# Patient Record
Sex: Female | Born: 1950
Health system: Southern US, Community
[De-identification: ages and names within clinical notes are randomized; demographics above are authoritative.]

## PROBLEM LIST (undated history)

## (undated) DIAGNOSIS — E039 Hypothyroidism, unspecified: Secondary | ICD-10-CM

## (undated) DIAGNOSIS — A6000 Herpesviral infection of urogenital system, unspecified: Secondary | ICD-10-CM

## (undated) DIAGNOSIS — I499 Cardiac arrhythmia, unspecified: Secondary | ICD-10-CM

## (undated) DIAGNOSIS — F419 Anxiety disorder, unspecified: Secondary | ICD-10-CM

## (undated) DIAGNOSIS — G629 Polyneuropathy, unspecified: Secondary | ICD-10-CM

## (undated) DIAGNOSIS — E7800 Pure hypercholesterolemia, unspecified: Secondary | ICD-10-CM

## (undated) DIAGNOSIS — K5900 Constipation, unspecified: Secondary | ICD-10-CM

## (undated) DIAGNOSIS — D649 Anemia, unspecified: Secondary | ICD-10-CM

## (undated) DIAGNOSIS — Z9884 Bariatric surgery status: Secondary | ICD-10-CM

## (undated) DIAGNOSIS — K649 Unspecified hemorrhoids: Secondary | ICD-10-CM

## (undated) DIAGNOSIS — E785 Hyperlipidemia, unspecified: Secondary | ICD-10-CM

## (undated) DIAGNOSIS — R748 Abnormal levels of other serum enzymes: Secondary | ICD-10-CM

## (undated) DIAGNOSIS — U071 COVID-19: Secondary | ICD-10-CM

## (undated) DIAGNOSIS — K219 Gastro-esophageal reflux disease without esophagitis: Secondary | ICD-10-CM

## (undated) DIAGNOSIS — T8859XA Other complications of anesthesia, initial encounter: Secondary | ICD-10-CM

## (undated) DIAGNOSIS — G4730 Sleep apnea, unspecified: Secondary | ICD-10-CM

## (undated) DIAGNOSIS — M1990 Unspecified osteoarthritis, unspecified site: Secondary | ICD-10-CM

## (undated) DIAGNOSIS — I10 Essential (primary) hypertension: Secondary | ICD-10-CM

## (undated) HISTORY — DX: Unspecified osteoarthritis, unspecified site: M19.90

## (undated) HISTORY — DX: Abnormal levels of other serum enzymes: R74.8

## (undated) HISTORY — DX: Anxiety disorder, unspecified: F41.9

## (undated) HISTORY — PX: ABDOMINAL HYSTERECTOMY: SHX81

## (undated) HISTORY — DX: COVID-19: U07.1

## (undated) HISTORY — DX: Constipation, unspecified: K59.00

## (undated) HISTORY — PX: LAPAROSCOPIC GASTRIC RESTRICTIVE DUODENAL PROCEDURE (DUODENAL SWITCH): SHX6667

## (undated) HISTORY — DX: Unspecified hemorrhoids: K64.9

## (undated) HISTORY — DX: Herpesviral infection of urogenital system, unspecified: A60.00

## (undated) HISTORY — PX: HEMORRHOID SURGERY: SHX153

## (undated) HISTORY — DX: Hyperlipidemia, unspecified: E78.5

## (undated) HISTORY — DX: Bariatric surgery status: Z98.84

## (undated) HISTORY — DX: Sleep apnea, unspecified: G47.30

---

## 1996-01-18 HISTORY — PX: BREAST EXCISIONAL BIOPSY: SUR124

## 2003-03-24 ENCOUNTER — Other Ambulatory Visit: Payer: Self-pay

## 2004-09-03 ENCOUNTER — Ambulatory Visit: Payer: Self-pay | Admitting: Obstetrics and Gynecology

## 2004-09-16 ENCOUNTER — Ambulatory Visit: Payer: Self-pay | Admitting: Obstetrics and Gynecology

## 2004-10-27 ENCOUNTER — Other Ambulatory Visit: Payer: Self-pay

## 2004-11-02 ENCOUNTER — Ambulatory Visit: Payer: Self-pay | Admitting: General Practice

## 2004-12-30 ENCOUNTER — Encounter: Payer: Self-pay | Admitting: General Practice

## 2005-01-17 ENCOUNTER — Encounter: Payer: Self-pay | Admitting: General Practice

## 2005-08-03 ENCOUNTER — Ambulatory Visit: Payer: Self-pay | Admitting: General Surgery

## 2005-09-28 ENCOUNTER — Ambulatory Visit: Payer: Self-pay | Admitting: Unknown Physician Specialty

## 2006-10-04 ENCOUNTER — Ambulatory Visit: Payer: Self-pay | Admitting: Unknown Physician Specialty

## 2007-08-06 ENCOUNTER — Ambulatory Visit: Payer: Self-pay | Admitting: General Practice

## 2007-08-07 ENCOUNTER — Ambulatory Visit: Payer: Self-pay | Admitting: General Practice

## 2007-10-09 ENCOUNTER — Ambulatory Visit: Payer: Self-pay | Admitting: Unknown Physician Specialty

## 2008-06-12 ENCOUNTER — Ambulatory Visit: Payer: Self-pay | Admitting: Internal Medicine

## 2008-12-10 ENCOUNTER — Ambulatory Visit: Payer: Self-pay | Admitting: Unknown Physician Specialty

## 2009-03-04 ENCOUNTER — Ambulatory Visit: Payer: Self-pay | Admitting: Unknown Physician Specialty

## 2009-07-22 ENCOUNTER — Ambulatory Visit: Payer: Self-pay | Admitting: General Practice

## 2009-10-27 ENCOUNTER — Encounter: Payer: Self-pay | Admitting: General Practice

## 2009-11-17 ENCOUNTER — Encounter: Payer: Self-pay | Admitting: General Practice

## 2009-12-16 ENCOUNTER — Ambulatory Visit: Payer: Self-pay | Admitting: Unknown Physician Specialty

## 2010-01-05 ENCOUNTER — Encounter: Payer: Self-pay | Admitting: General Practice

## 2010-01-17 ENCOUNTER — Encounter: Payer: Self-pay | Admitting: General Practice

## 2010-07-04 ENCOUNTER — Emergency Department: Payer: Self-pay | Admitting: Emergency Medicine

## 2011-01-06 ENCOUNTER — Ambulatory Visit: Payer: Self-pay | Admitting: Unknown Physician Specialty

## 2011-11-09 ENCOUNTER — Ambulatory Visit: Payer: Self-pay | Admitting: Internal Medicine

## 2011-11-10 ENCOUNTER — Ambulatory Visit: Payer: Self-pay | Admitting: Internal Medicine

## 2011-12-05 ENCOUNTER — Ambulatory Visit: Payer: Self-pay | Admitting: Internal Medicine

## 2011-12-30 ENCOUNTER — Ambulatory Visit: Payer: Self-pay | Admitting: Internal Medicine

## 2012-01-12 ENCOUNTER — Ambulatory Visit: Payer: Self-pay | Admitting: Internal Medicine

## 2012-01-16 DIAGNOSIS — J069 Acute upper respiratory infection, unspecified: Secondary | ICD-10-CM | POA: Insufficient documentation

## 2012-03-06 DIAGNOSIS — R9431 Abnormal electrocardiogram [ECG] [EKG]: Secondary | ICD-10-CM | POA: Insufficient documentation

## 2012-03-15 DIAGNOSIS — F4322 Adjustment disorder with anxiety: Secondary | ICD-10-CM | POA: Insufficient documentation

## 2012-03-15 DIAGNOSIS — F419 Anxiety disorder, unspecified: Secondary | ICD-10-CM | POA: Insufficient documentation

## 2012-03-15 DIAGNOSIS — F32A Depression, unspecified: Secondary | ICD-10-CM | POA: Insufficient documentation

## 2012-06-15 ENCOUNTER — Ambulatory Visit: Payer: Self-pay | Admitting: Unknown Physician Specialty

## 2012-07-17 ENCOUNTER — Encounter: Payer: Self-pay | Admitting: Family Medicine

## 2012-07-17 ENCOUNTER — Ambulatory Visit (INDEPENDENT_AMBULATORY_CARE_PROVIDER_SITE_OTHER): Payer: 59 | Admitting: Family Medicine

## 2012-07-17 VITALS — Ht 61.0 in | Wt 188.4 lb

## 2012-07-17 DIAGNOSIS — E669 Obesity, unspecified: Secondary | ICD-10-CM | POA: Insufficient documentation

## 2012-07-17 DIAGNOSIS — R7301 Impaired fasting glucose: Secondary | ICD-10-CM

## 2012-07-17 NOTE — Patient Instructions (Addendum)
-   Recommended Family Medicine practice:  [place] [facility].  - Diet Recommendations for Diabetes   Starchy (carb) foods include: Bread, rice, pasta, potatoes, corn, crackers, bagels, muffins, all baked goods.  The best carb foods are those with a lot of fiber, e.g., vegetables, fruit (especially those from which we eat skin and seeds)  Protein foods include: Meat, fish, poultry, eggs, dairy foods, and beans such as pinto and kidney beans (beans also provide carbohydrate).  When looking at labels, look for TOTAL CARBOHYDRATE, not just sugar.   1. Eat at least 3 meals and 1-2 snacks per day. Never go more than 4-5 hours while awake without eating.  2. Limit starchy foods to TWO per meal and ONE per snack. ONE portion of a starchy  food is equal to the following:   - ONE slice of bread (or its equivalent, such as half of a hamburger bun).   - 1/2 cup of a "scoopable" starchy food such as potatoes or rice.   - 1 OUNCE (28 grams) of starchy snack foods such as crackers or pretzels (look on label).   - 15 grams of carbohydrate as shown on food label.  3. Both lunch and dinner should include a protein food, a carb food, and vegetables.   - Obtain twice as many veg's as protein or carbohydrate foods for both lunch and dinner.   - Try to keep frozen veg's on hand for a quick vegetable serving.     - Fresh or frozen veg's are best.  4. Breakfast should always include protein.    - If you are using beans as a protein source, you need to have at least 1 full cup for a serving.  With tofu, at least 3 (4 is better) ounces for a full protein serving.   - Dr. Gerilyn Pilgrim:  [phone]; [email or web address].

## 2012-07-17 NOTE — Progress Notes (Signed)
Medical Nutrition Therapy:  Appt start time: 1400 end time:  1500.  Assessment:  Primary concerns today: Weight management and Blood sugar control.  Jennifer [name] was referred for MNT to prevent her pre-DM from progressing to DM.   Usual eating pattern includes 3 meals and 1-2 snacks per day. Usual physical activity includes 45 min TM walking and 15-20 min stationary bike at least 5 days a week; just started this routine this week.  She had an accident at work in January, and hurt her back.  She has been doing PT twice a week (as well as injection, which was not very helpful).   Jennifer [name]'s PCP will be moving to [facility], so she asked for a referral for a PCP in the [place] area.    A1C on 06-26-12 was 6.3; TG were 314; LDL 114.   Frequent foods include chicken, fish, low-sugar juices, water.  Avoided foods include none.    24-hr recall: (Up at 5:30 AM) B (6 AM)-   1/2 c dry oatmeal, 2 tbsp half & half, 4-6 pkts Splenda, 1/2 tsp cinnamon Snk ( AM)-   water L ( PM)-  none Snk (4 PM)-  2 glasses wine D (5 PM)-  5 oz tilapia w/ olive oil & butter, 1 1/2 c collards, sweet potato w/ 4 pkts Splenda & cinnamon Snk (7 PM)-  2 c cubed watermelon, 1 peach   Progress Towards Goal(s):  In progress.   Nutritional Diagnosis:  Jennifer [name]-3.3 Overweight/obesity As related to desirable body weight.  As evidenced by BMI >35.    Intervention:  Nutrition education.  Monitoring/Evaluation:  Dietary intake, exercise, and body weight in 6 week(s).

## 2012-08-28 ENCOUNTER — Ambulatory Visit: Payer: 59 | Admitting: Family Medicine

## 2012-09-18 ENCOUNTER — Ambulatory Visit: Payer: 59 | Admitting: Family Medicine

## 2012-10-10 ENCOUNTER — Ambulatory Visit: Payer: Self-pay | Admitting: Specialist

## 2012-11-13 ENCOUNTER — Ambulatory Visit: Payer: Self-pay | Admitting: Specialist

## 2012-11-17 ENCOUNTER — Ambulatory Visit: Payer: Self-pay | Admitting: Specialist

## 2012-12-17 ENCOUNTER — Ambulatory Visit: Payer: Self-pay | Admitting: Internal Medicine

## 2013-01-07 ENCOUNTER — Ambulatory Visit: Payer: Self-pay | Admitting: Unknown Physician Specialty

## 2013-01-09 LAB — PATHOLOGY REPORT

## 2013-01-17 HISTORY — PX: OTHER SURGICAL HISTORY: SHX169

## 2013-01-25 ENCOUNTER — Ambulatory Visit: Payer: Self-pay | Admitting: Specialist

## 2013-06-20 ENCOUNTER — Ambulatory Visit: Payer: Self-pay | Admitting: Specialist

## 2013-06-20 LAB — BASIC METABOLIC PANEL
Anion Gap: 5 — ABNORMAL LOW (ref 7–16)
BUN: 19 mg/dL — ABNORMAL HIGH (ref 7–18)
Calcium, Total: 10 mg/dL (ref 8.5–10.1)
Chloride: 99 mmol/L (ref 98–107)
Co2: 31 mmol/L (ref 21–32)
Creatinine: 0.67 mg/dL (ref 0.60–1.30)
EGFR (African American): >60
EGFR (Non-African Amer.): >60
Glucose: 100 mg/dL — ABNORMAL HIGH (ref 65–99)
Osmolality: 272 (ref 275–301)
Potassium: 3.6 mmol/L (ref 3.5–5.1)
Sodium: 135 mmol/L — ABNORMAL LOW (ref 136–145)

## 2013-06-25 ENCOUNTER — Inpatient Hospital Stay: Payer: Self-pay | Admitting: Specialist

## 2013-06-26 LAB — CBC WITH DIFFERENTIAL/PLATELET
Basophil #: 0 10*3/uL (ref 0.0–0.1)
Basophil %: 0.3 %
Eosinophil #: 0 10*3/uL (ref 0.0–0.7)
Eosinophil %: 0 %
HCT: 35.9 % (ref 35.0–47.0)
HGB: 11.2 g/dL — ABNORMAL LOW (ref 12.0–16.0)
Lymphocyte #: 1.5 10*3/uL (ref 1.0–3.6)
Lymphocyte %: 15 %
MCH: 26.3 pg (ref 26.0–34.0)
MCHC: 31.2 g/dL — ABNORMAL LOW (ref 32.0–36.0)
MCV: 84 fL (ref 80–100)
Monocyte #: 0.7 x10 3/mm (ref 0.2–0.9)
Monocyte %: 6.5 %
Neutrophil #: 8 10*3/uL — ABNORMAL HIGH (ref 1.4–6.5)
Neutrophil %: 78.2 %
Platelet: 184 10*3/uL (ref 150–440)
RBC: 4.27 10*6/uL (ref 3.80–5.20)
RDW: 14.5 % (ref 11.5–14.5)
WBC: 10.2 10*3/uL (ref 3.6–11.0)

## 2013-06-26 LAB — ALBUMIN: Albumin: 3.6 g/dL (ref 3.4–5.0)

## 2013-06-26 LAB — BASIC METABOLIC PANEL
Anion Gap: 5 — ABNORMAL LOW (ref 7–16)
BUN: 12 mg/dL (ref 7–18)
Calcium, Total: 8.5 mg/dL (ref 8.5–10.1)
Chloride: 106 mmol/L (ref 98–107)
Co2: 28 mmol/L (ref 21–32)
Creatinine: 0.72 mg/dL (ref 0.60–1.30)
EGFR (African American): >60
EGFR (Non-African Amer.): >60
Glucose: 108 mg/dL — ABNORMAL HIGH (ref 65–99)
Osmolality: 278 (ref 275–301)
Potassium: 4.3 mmol/L (ref 3.5–5.1)
Sodium: 139 mmol/L (ref 136–145)

## 2013-06-26 LAB — MAGNESIUM: Magnesium: 1.9 mg/dL

## 2013-06-26 LAB — PHOSPHORUS: Phosphorus: 2.8 mg/dL (ref 2.5–4.9)

## 2013-06-27 LAB — PATHOLOGY REPORT

## 2013-07-15 ENCOUNTER — Ambulatory Visit: Payer: Self-pay | Admitting: Specialist

## 2013-07-17 ENCOUNTER — Ambulatory Visit: Payer: Self-pay | Admitting: Specialist

## 2013-07-25 DIAGNOSIS — K2990 Gastroduodenitis, unspecified, without bleeding: Secondary | ICD-10-CM | POA: Insufficient documentation

## 2013-07-30 ENCOUNTER — Ambulatory Visit: Payer: Self-pay | Admitting: Unknown Physician Specialty

## 2013-08-20 DIAGNOSIS — B373 Candidiasis of vulva and vagina: Secondary | ICD-10-CM | POA: Insufficient documentation

## 2013-08-20 DIAGNOSIS — B3731 Acute candidiasis of vulva and vagina: Secondary | ICD-10-CM | POA: Insufficient documentation

## 2013-11-19 DIAGNOSIS — M5116 Intervertebral disc disorders with radiculopathy, lumbar region: Secondary | ICD-10-CM | POA: Insufficient documentation

## 2013-11-19 DIAGNOSIS — M5416 Radiculopathy, lumbar region: Secondary | ICD-10-CM | POA: Insufficient documentation

## 2013-11-21 ENCOUNTER — Ambulatory Visit: Payer: Self-pay | Admitting: Internal Medicine

## 2014-01-08 DIAGNOSIS — B009 Herpesviral infection, unspecified: Secondary | ICD-10-CM | POA: Insufficient documentation

## 2014-01-08 DIAGNOSIS — E079 Disorder of thyroid, unspecified: Secondary | ICD-10-CM | POA: Insufficient documentation

## 2014-05-10 NOTE — Op Note (Signed)
PATIENT NAME:  Jennifer, [name] MR#:  [id number] DATE OF BIRTH:  1951-01-11  DATE OF PROCEDURE:  06/25/2013  PREOPERATIVE DIAGNOSIS: Morbid obesity.   POSTOPERATIVE DIAGNOSES:  1.  Morbid obesity.  2.  Hiatal hernia.   PROCEDURE: Laparoscopic sleeve gastrectomy with hiatal hernia repair.   SURGEON: Kreg Shropshire, M.D.   ASSISTANT: Liliane Bade, PA [employer] .   ANESTHESIA: General endotracheal.   CLINICAL INDICATIONS: See H and P.    DETAILS OF PROCEDURE: The patient was taken to the operating room and placed on the operating room table in the supine, monitors and supplemental oxygen being delivered. Broad spectrum IV antibiotics were administered. The patient was placed under general anesthesia without incident. The abdomen was prepped and draped in the usual sterile fashion. Access was obtained using 5 mm Optical trocar in the left upper outer quadrant. Pneumoperitoneum was established. Multiple other ports were placed in preparation for sleeve gastrectomy and liver retractor placed. At that point, a moderate hiatal hernia was identified. The Harmonic scalpel was utilized to take down the pars flaccida of the right crura and the right crura was fully mobilized. The esophagus was retracted anteriorly showing the crural junction and the moderate hiatal hernia. The posterior vagus nerve was preserved throughout the entire dissection.  The posterior crural repair was performed using interrupted 0 Surgidac suture. At that point, attention was then placed to the stomach. The greater curvature vascular was mobilized from 5 to 7 cm from the pylorus all the way up to the left crura. The posterior attachments were taken down as well completely mobilizing the fundus and the greater curvature of the stomach so the crow's feet could be fully visualized all the way up. A 34 [place] Bougie was then inserted transorally down into the distal antrum and several sequential firings starting with an echelon green  load used to bisect the antrum between the lesser curvature and the greater curvature and then sequential firings of the blue load through the opposite trocar. Using the technique of Hargroder all the way up to just lateral to the GE junction. Staples were all formed very nicely. No evidence of leak was present. No evidence of bleeding was present. The specimen was retrieved and sent for pathologic evaluation. The entire operation blood loss was less than 250 mL. No complications occurred during the entire procedure. I performed upper endoscopy and a nice curvilinear tube was present without any impingement upon the linea angularis. I was quite pleased with the outcome. I used the suction and sucked out the extra gas. I then regowned and regloved and pulled out the delivery retractor as the specimen had already been retrieved. The wounds were closed with 4-0 Vicryl and Dermabond. The patient tolerated the procedure well, was taken to the recovery room in stable condition, and will be admitted for approximately two days for care.    ADDENDUM: The hiatal hernia was closed using interrupted Surgidac suture with excellent effect. ____________________________ Kreg Shropshire, MD jb:aw D: 06/25/2013 10:45:03 ET T: 06/25/2013 10:54:12 ET JOB#: [id number]  cc: Kreg Shropshire, MD, <Dictator> Bonner Puna MD ELECTRONICALLY SIGNED 06/26/2013 18:29

## 2014-07-23 DIAGNOSIS — E780 Pure hypercholesterolemia: Secondary | ICD-10-CM | POA: Diagnosis not present

## 2014-07-23 DIAGNOSIS — I10 Essential (primary) hypertension: Secondary | ICD-10-CM | POA: Diagnosis not present

## 2014-07-31 DIAGNOSIS — E038 Other specified hypothyroidism: Secondary | ICD-10-CM | POA: Diagnosis not present

## 2014-07-31 DIAGNOSIS — I10 Essential (primary) hypertension: Secondary | ICD-10-CM | POA: Diagnosis not present

## 2014-07-31 DIAGNOSIS — R739 Hyperglycemia, unspecified: Secondary | ICD-10-CM | POA: Diagnosis not present

## 2014-07-31 DIAGNOSIS — E780 Pure hypercholesterolemia: Secondary | ICD-10-CM | POA: Diagnosis not present

## 2014-08-25 DIAGNOSIS — J069 Acute upper respiratory infection, unspecified: Secondary | ICD-10-CM | POA: Diagnosis not present

## 2014-09-03 ENCOUNTER — Emergency Department
Admission: EM | Admit: 2014-09-03 | Discharge: 2014-09-03 | Disposition: A | Discharge status: Home / Self Care | Payer: Medicare Other | Attending: Emergency Medicine | Admitting: Emergency Medicine

## 2014-09-03 ENCOUNTER — Encounter: Payer: Self-pay | Admitting: Emergency Medicine

## 2014-09-03 ENCOUNTER — Other Ambulatory Visit: Payer: Self-pay

## 2014-09-03 DIAGNOSIS — Z7982 Long term (current) use of aspirin: Secondary | ICD-10-CM | POA: Insufficient documentation

## 2014-09-03 DIAGNOSIS — Z79899 Other long term (current) drug therapy: Secondary | ICD-10-CM | POA: Insufficient documentation

## 2014-09-03 DIAGNOSIS — Z87891 Personal history of nicotine dependence: Secondary | ICD-10-CM | POA: Diagnosis not present

## 2014-09-03 DIAGNOSIS — I10 Essential (primary) hypertension: Secondary | ICD-10-CM

## 2014-09-03 HISTORY — DX: Pure hypercholesterolemia, unspecified: E78.00

## 2014-09-03 HISTORY — DX: Hypothyroidism, unspecified: E03.9

## 2014-09-03 HISTORY — DX: Essential (primary) hypertension: I10

## 2014-09-03 LAB — BASIC METABOLIC PANEL
Anion gap: 10 (ref 5–15)
BUN: 15 mg/dL (ref 6–20)
CO2: 30 mmol/L (ref 22–32)
Calcium: 10 mg/dL (ref 8.9–10.3)
Chloride: 99 mmol/L — ABNORMAL LOW (ref 101–111)
Creatinine, Ser: 0.73 mg/dL (ref 0.44–1.00)
GFR calc Af Amer: >60 mL/min (ref >60)
GFR calc non Af Amer: >60 mL/min (ref >60)
Glucose, Bld: 115 mg/dL — ABNORMAL HIGH (ref 65–99)
Potassium: 3.6 mmol/L (ref 3.5–5.1)
Sodium: 139 mmol/L (ref 135–145)

## 2014-09-03 LAB — CBC
HCT: 41.1 % (ref 35.0–47.0)
Hemoglobin: 13.4 g/dL (ref 12.0–16.0)
MCH: 27.1 pg (ref 26.0–34.0)
MCHC: 32.5 g/dL (ref 32.0–36.0)
MCV: 83.4 fL (ref 80.0–100.0)
Platelets: 166 10*3/uL (ref 150–440)
RBC: 4.93 MIL/uL (ref 3.80–5.20)
RDW: 14.6 % — ABNORMAL HIGH (ref 11.5–14.5)
WBC: 7.4 10*3/uL (ref 3.6–11.0)

## 2014-09-03 LAB — TROPONIN I: Troponin I: <0.03 ng/mL (ref <0.031)

## 2014-09-03 NOTE — ED Provider Notes (Signed)
[facility] Emergency Department Provider Note   ____________________________________________  Time seen: 10:45 PM I have reviewed the triage vital signs and the triage nursing note.  HISTORY  Chief Complaint Hypertension   Historian Patient and husband  HPI Jennifer [name] is a 64 y.o. female who is coming in for evaluation of elevated blood pressures for at least one week. She's been taking her blood pressure and noticing that it's been elevated and creeping up over the past at least one week. She takes valsartan and hydrochlorothiazide. Her primary care physician is in [place]. She does report that she has a prescription for amlodipine, however she's not sure she's has been taking that daily and so she is not currently. No chest pain. This morning she did experience some right-sided neck fullness or discomfort up into her right temple, that is currently gone. No history of chronic headaches. There is no chest pain or trouble breathing. She has done a stress test in the past and this was about 6 years ago. No weakness or numbness. No speech problems.   Past Medical History  Diagnosis Date  . Hypertension   . High cholesterol   . Hypothyroidism     Patient Active Problem List   Diagnosis Date Noted  . Impaired fasting glucose 07/17/2012  . Obesity, unspecified 07/17/2012    Past Surgical History  Procedure Laterality Date  . Abdominal hysterectomy    . Carpal tunnel repair    . Bariatric sleeve    . Hemorrhoid surgery      Current Outpatient Rx  Name  Route  Sig  Dispense  Refill  . acyclovir (ZOVIRAX) 400 MG tablet   Oral   Take 400 mg by mouth every 4 (four) hours while awake.         [name] Kitchen amLODipine (NORVASC) 2.5 MG tablet   Oral   Take 2.5 mg by mouth daily.         [name] Kitchen aspirin 81 MG tablet   Oral   Take 81 mg by mouth daily.         . Biotin 5000 MCG CAPS   Oral   Take 5,000 mcg by mouth.         . cholecalciferol (VITAMIN  D) 1000 UNITS tablet   Oral   Take 1,000 Units by mouth daily.         . fluocinonide (LIDEX) 0.05 % external solution   Topical   Apply topically 2 (two) times daily.         [name] Kitchen HYDROCODONE BITARTRATE ER PO   Oral   Take by mouth as needed.         [name] Kitchen levothyroxine (SYNTHROID, LEVOTHROID) 25 MCG tablet   Oral   Take 25 mcg by mouth daily before breakfast.         . olmesartan-hydrochlorothiazide (BENICAR HCT) 40-25 MG per tablet   Oral   Take 1 tablet by mouth daily.         . Pediatric Multivit-Minerals-C (GUMMI BEAR MULTIVITAMIN/MIN PO)   Oral   Take by mouth.         . pravastatin (PRAVACHOL) 40 MG tablet   Oral   Take 40 mg by mouth daily.         [name] Kitchen zolpidem (AMBIEN) 10 MG tablet   Oral   Take 10 mg by mouth at bedtime as needed for sleep.           Allergies Vicodin  No family history on file.  Social History Social History  Substance Use Topics  . Smoking status: Former Research scientist (life sciences)  . Smokeless tobacco: Never Used  . Alcohol Use: 8.4 oz/week    14 Glasses of wine per week    Review of Systems  Constitutional: Negative for fever. Eyes: Negative for visual changes. ENT: Negative for sore throat. Cardiovascular: Negative for palpitations. Respiratory: Negative for shortness of breath. Gastrointestinal: Negative for abdominal pain, vomiting and diarrhea. Genitourinary: Negative for dysuria. Musculoskeletal: Negative for back pain. Skin: Negative for rash. Neurological: Negative for headaches, focal weakness or numbness. 10 point Review of Systems otherwise negative ____________________________________________   PHYSICAL EXAM:  VITAL SIGNS: ED Triage Vitals  Enc Vitals Group     BP 09/03/14 1911 153/78 mmHg     Pulse Rate 09/03/14 1911 62     Resp 09/03/14 2230 21     Temp 09/03/14 1911 97.6 F (36.4 C)     Temp Source 09/03/14 1911 Oral     SpO2 09/03/14 1911 99 %     Weight 09/03/14 1911 170 lb (77.111 kg)     Height 09/03/14  1911 5\' 2"  (1.575 m)     Head Cir --      Peak Flow --      Pain Score 09/03/14 1911 2     Pain Loc --      Pain Edu? --      Excl. in [place]? --      Constitutional: Alert and oriented. Well appearing and in no distress. Eyes: Conjunctivae are normal. PERRL. Normal extraocular movements. ENT   Head: Normocephalic and atraumatic.   Nose: No congestion/rhinnorhea.   Mouth/Throat: Mucous membranes are moist.   Neck: No stridor. Cardiovascular/Chest: Normal rate, regular rhythm.  No murmurs, rubs, or gallops. Respiratory: Normal respiratory effort without tachypnea nor retractions. Breath sounds are clear and equal bilaterally. No wheezes/rales/rhonchi. Gastrointestinal: Soft. No distention, no guarding, no rebound. Nontender   Genitourinary/rectal:Deferred Musculoskeletal: Nontender with normal range of motion in all extremities. No joint effusions.  No lower extremity tenderness nor edema. Neurologic:  Normal speech and language. No gross or focal neurologic deficits are appreciated. Skin:  Skin is warm, dry and intact. No rash noted. Psychiatric: Mood and affect are normal. Speech and behavior are normal. Patient exhibits appropriate insight and judgment.  ____________________________________________   EKG I, Lisa Roca, MD, the attending physician have personally viewed and interpreted all ECGs.  59 bpm. Sinus bradycardia. Narrow QRS. Normal axis. Nonspecific T wave. ____________________________________________  LABS (pertinent positives/negatives)  Basic metabolic panel without significant abnormality CBC within normal limits Troponin less than 0.03  ____________________________________________  RADIOLOGY All Xrays were viewed by me. Imaging interpreted by Radiologist.  None __________________________________________  PROCEDURES  Procedure(s) performed: None Critical Care performed: None  ____________________________________________   ED COURSE /  ASSESSMENT AND PLAN  CONSULTATIONS: None  Pertinent labs & imaging results that were available during my care of the patient were reviewed by me and considered in my medical decision making (see chart for details).   Patient's main complaint is hypertension, her blood pressure here is in the 160s over 80s. She did take a double dose of her blood pressure pill, abdomen ask her to do the same tomorrow if her blood pressure is elevated. She did have an episode of right-sided neck discomfort up into the right side of her temple, which is now gone, and was without any associated symptoms. I feel it is unlikely to have been an anginal equivalent, her EKG is reassuring, as  is her negative troponin. I would ask her to follow-up with a cardiologist, and referred her to Dr. Clayborn Bigness.  She is to call her private care physician's office in the morning to discuss any additional blood pressure medication management changes in medications.   Patient / Family / Caregiver informed of clinical course, medical decision-making process, and agree with plan.   I discussed return precautions, follow-up instructions, and discharged instructions with patient and/or family.  ___________________________________________   FINAL CLINICAL IMPRESSION(S) / ED DIAGNOSES   Final diagnoses:  Essential hypertension    FOLLOW UP  Referred to: Primary care physician, one week Cardiologist, Dr. Clayborn Bigness, 1-2 days.   Lisa Roca, MD 09/03/14 [phone]

## 2014-09-03 NOTE — Discharge Instructions (Signed)
You were evaluated for right sided neck discomfort, as well as elevated blood pressures, and your exam and evaluation are reassuring today.  Return to the emergency department for any new or worsening condition including chest pain, shortness of breath, nausea, vomiting, sweating, weakness, numbness, or any other symptoms concerning to you.  I suggested follow-up with a cardiologist, you're referred to call Dr. Etta Quill office in the morning for appointment within one to 2 days for further evaluation and possible stress test.  We discussed in the morning if your blood pressure is greater than 180/98 you may take a double dose of your blood pressure tablet valsartan/hydrochlorothiazide.  Hypertension Hypertension, commonly called high blood pressure, is when the force of blood pumping through your arteries is too strong. Your arteries are the blood vessels that carry blood from your heart throughout your body. A blood pressure reading consists of a higher number over a lower number, such as 110/72. The higher number (systolic) is the pressure inside your arteries when your heart pumps. The lower number (diastolic) is the pressure inside your arteries when your heart relaxes. Ideally you want your blood pressure below 120/80. Hypertension forces your heart to work harder to pump blood. Your arteries may become narrow or stiff. Having hypertension puts you at risk for heart disease, stroke, and other problems.  RISK FACTORS Some risk factors for high blood pressure are controllable. Others are not.  Risk factors you cannot control include:   Race. You may be at higher risk if you are African American.  Age. Risk increases with age.  Gender. Men are at higher risk than women before age 86 years. After age 20, women are at higher risk than men. Risk factors you can control include:  Not getting enough exercise or physical activity.  Being overweight.  Getting too much fat, sugar, calories, or  salt in your diet.  Drinking too much alcohol. SIGNS AND SYMPTOMS Hypertension does not usually cause signs or symptoms. Extremely high blood pressure (hypertensive crisis) may cause headache, anxiety, shortness of breath, and nosebleed. DIAGNOSIS  To check if you have hypertension, your health care provider will measure your blood pressure while you are seated, with your arm held at the level of your heart. It should be measured at least twice using the same arm. Certain conditions can cause a difference in blood pressure between your right and left arms. A blood pressure reading that is higher than normal on one occasion does not mean that you need treatment. If one blood pressure reading is high, ask your health care provider about having it checked again. TREATMENT  Treating high blood pressure includes making lifestyle changes and possibly taking medicine. Living a healthy lifestyle can help lower high blood pressure. You may need to change some of your habits. Lifestyle changes may include:  Following the DASH diet. This diet is high in fruits, vegetables, and whole grains. It is low in salt, red meat, and added sugars.  Getting at least 2 hours of brisk physical activity every week.  Losing weight if necessary.  Not smoking.  Limiting alcoholic beverages.  Learning ways to reduce stress. If lifestyle changes are not enough to get your blood pressure under control, your health care provider may prescribe medicine. You may need to take more than one. Work closely with your health care provider to understand the risks and benefits. HOME CARE INSTRUCTIONS  Have your blood pressure rechecked as directed by your health care provider.   Take medicines only  as directed by your health care provider. Follow the directions carefully. Blood pressure medicines must be taken as prescribed. The medicine does not work as well when you skip doses. Skipping doses also puts you at risk for  problems.   Do not smoke.   Monitor your blood pressure at home as directed by your health care provider. SEEK MEDICAL CARE IF:   You think you are having a reaction to medicines taken.  You have recurrent headaches or feel dizzy.  You have swelling in your ankles.  You have trouble with your vision. SEEK IMMEDIATE MEDICAL CARE IF:  You develop a severe headache or confusion.  You have unusual weakness, numbness, or feel faint.  You have severe chest or abdominal pain.  You vomit repeatedly.  You have trouble breathing. MAKE SURE YOU:   Understand these instructions.  Will watch your condition.  Will get help right away if you are not doing well or get worse. Document Released: 01/03/2005 Document Revised: 05/20/2013 Document Reviewed: 10/26/2012 [facility] Patient Information 2015 [place], [place]. This information is not intended to replace advice given to you by your health care provider. Make sure you discuss any questions you have with your health care provider.

## 2014-09-03 NOTE — ED Notes (Signed)
Pt to triage via w/c with no distress noted; pt reports elevated BP this evening, c/o pressure to right side neck/head/face; st hx of same

## 2014-09-05 DIAGNOSIS — R002 Palpitations: Secondary | ICD-10-CM | POA: Diagnosis not present

## 2014-09-05 DIAGNOSIS — R9431 Abnormal electrocardiogram [ECG] [EKG]: Secondary | ICD-10-CM | POA: Diagnosis not present

## 2014-09-05 DIAGNOSIS — I10 Essential (primary) hypertension: Secondary | ICD-10-CM | POA: Diagnosis not present

## 2014-09-05 DIAGNOSIS — R001 Bradycardia, unspecified: Secondary | ICD-10-CM | POA: Diagnosis not present

## 2014-09-05 DIAGNOSIS — E669 Obesity, unspecified: Secondary | ICD-10-CM | POA: Diagnosis not present

## 2014-09-10 DIAGNOSIS — R001 Bradycardia, unspecified: Secondary | ICD-10-CM | POA: Diagnosis not present

## 2014-09-11 DIAGNOSIS — I10 Essential (primary) hypertension: Secondary | ICD-10-CM | POA: Diagnosis not present

## 2014-09-11 DIAGNOSIS — L819 Disorder of pigmentation, unspecified: Secondary | ICD-10-CM | POA: Diagnosis not present

## 2014-09-11 DIAGNOSIS — E780 Pure hypercholesterolemia: Secondary | ICD-10-CM | POA: Diagnosis not present

## 2014-09-19 DIAGNOSIS — K219 Gastro-esophageal reflux disease without esophagitis: Secondary | ICD-10-CM | POA: Diagnosis not present

## 2014-09-19 DIAGNOSIS — Z7689 Persons encountering health services in other specified circumstances: Secondary | ICD-10-CM | POA: Diagnosis not present

## 2014-09-19 DIAGNOSIS — M1990 Unspecified osteoarthritis, unspecified site: Secondary | ICD-10-CM | POA: Diagnosis not present

## 2014-09-19 DIAGNOSIS — E784 Other hyperlipidemia: Secondary | ICD-10-CM | POA: Diagnosis not present

## 2014-10-20 DIAGNOSIS — R001 Bradycardia, unspecified: Secondary | ICD-10-CM | POA: Diagnosis not present

## 2014-10-21 DIAGNOSIS — E034 Atrophy of thyroid (acquired): Secondary | ICD-10-CM | POA: Diagnosis not present

## 2014-10-21 DIAGNOSIS — Z79899 Other long term (current) drug therapy: Secondary | ICD-10-CM | POA: Diagnosis not present

## 2014-10-21 DIAGNOSIS — I10 Essential (primary) hypertension: Secondary | ICD-10-CM | POA: Diagnosis not present

## 2014-10-21 DIAGNOSIS — R739 Hyperglycemia, unspecified: Secondary | ICD-10-CM | POA: Diagnosis not present

## 2014-10-21 DIAGNOSIS — E038 Other specified hypothyroidism: Secondary | ICD-10-CM | POA: Diagnosis not present

## 2014-10-21 DIAGNOSIS — E7800 Pure hypercholesterolemia, unspecified: Secondary | ICD-10-CM | POA: Diagnosis not present

## 2014-10-28 DIAGNOSIS — R739 Hyperglycemia, unspecified: Secondary | ICD-10-CM | POA: Diagnosis not present

## 2014-10-28 DIAGNOSIS — Z23 Encounter for immunization: Secondary | ICD-10-CM | POA: Diagnosis not present

## 2014-10-28 DIAGNOSIS — I10 Essential (primary) hypertension: Secondary | ICD-10-CM | POA: Diagnosis not present

## 2014-10-28 DIAGNOSIS — E7800 Pure hypercholesterolemia, unspecified: Secondary | ICD-10-CM | POA: Diagnosis not present

## 2014-10-28 DIAGNOSIS — E034 Atrophy of thyroid (acquired): Secondary | ICD-10-CM | POA: Diagnosis not present

## 2014-11-25 DIAGNOSIS — K912 Postsurgical malabsorption, not elsewhere classified: Secondary | ICD-10-CM | POA: Diagnosis not present

## 2014-11-25 DIAGNOSIS — Z9884 Bariatric surgery status: Secondary | ICD-10-CM | POA: Diagnosis not present

## 2014-12-02 DIAGNOSIS — H04123 Dry eye syndrome of bilateral lacrimal glands: Secondary | ICD-10-CM | POA: Diagnosis not present

## 2014-12-02 DIAGNOSIS — H2513 Age-related nuclear cataract, bilateral: Secondary | ICD-10-CM | POA: Diagnosis not present

## 2014-12-02 DIAGNOSIS — H524 Presbyopia: Secondary | ICD-10-CM | POA: Diagnosis not present

## 2014-12-05 DIAGNOSIS — Z23 Encounter for immunization: Secondary | ICD-10-CM | POA: Diagnosis not present

## 2015-01-22 DIAGNOSIS — E7800 Pure hypercholesterolemia, unspecified: Secondary | ICD-10-CM | POA: Diagnosis not present

## 2015-01-22 DIAGNOSIS — R739 Hyperglycemia, unspecified: Secondary | ICD-10-CM | POA: Diagnosis not present

## 2015-01-22 DIAGNOSIS — E034 Atrophy of thyroid (acquired): Secondary | ICD-10-CM | POA: Diagnosis not present

## 2015-01-28 DIAGNOSIS — E7800 Pure hypercholesterolemia, unspecified: Secondary | ICD-10-CM | POA: Diagnosis not present

## 2015-01-28 DIAGNOSIS — R7989 Other specified abnormal findings of blood chemistry: Secondary | ICD-10-CM | POA: Diagnosis not present

## 2015-01-28 DIAGNOSIS — I10 Essential (primary) hypertension: Secondary | ICD-10-CM | POA: Diagnosis not present

## 2015-01-28 DIAGNOSIS — E034 Atrophy of thyroid (acquired): Secondary | ICD-10-CM | POA: Diagnosis not present

## 2015-01-28 DIAGNOSIS — G4709 Other insomnia: Secondary | ICD-10-CM | POA: Diagnosis not present

## 2015-01-28 DIAGNOSIS — R739 Hyperglycemia, unspecified: Secondary | ICD-10-CM | POA: Diagnosis not present

## 2015-01-28 DIAGNOSIS — F419 Anxiety disorder, unspecified: Secondary | ICD-10-CM | POA: Diagnosis not present

## 2015-02-11 DIAGNOSIS — I10 Essential (primary) hypertension: Secondary | ICD-10-CM | POA: Diagnosis not present

## 2015-02-11 DIAGNOSIS — R7989 Other specified abnormal findings of blood chemistry: Secondary | ICD-10-CM | POA: Diagnosis not present

## 2015-02-11 DIAGNOSIS — E034 Atrophy of thyroid (acquired): Secondary | ICD-10-CM | POA: Diagnosis not present

## 2015-02-11 DIAGNOSIS — E7800 Pure hypercholesterolemia, unspecified: Secondary | ICD-10-CM | POA: Diagnosis not present

## 2015-02-11 DIAGNOSIS — F419 Anxiety disorder, unspecified: Secondary | ICD-10-CM | POA: Diagnosis not present

## 2015-02-12 ENCOUNTER — Other Ambulatory Visit: Payer: Self-pay | Admitting: Internal Medicine

## 2015-02-12 DIAGNOSIS — Z1231 Encounter for screening mammogram for malignant neoplasm of breast: Secondary | ICD-10-CM

## 2015-02-20 ENCOUNTER — Ambulatory Visit
Admission: RE | Admit: 2015-02-20 | Discharge: 2015-02-20 | Disposition: A | Discharge status: Home / Self Care | Payer: Medicare Other | Source: Ambulatory Visit | Attending: Internal Medicine | Admitting: Internal Medicine

## 2015-02-20 ENCOUNTER — Other Ambulatory Visit: Payer: Self-pay | Admitting: Internal Medicine

## 2015-02-20 DIAGNOSIS — Z1231 Encounter for screening mammogram for malignant neoplasm of breast: Secondary | ICD-10-CM | POA: Insufficient documentation

## 2015-03-23 DIAGNOSIS — E784 Other hyperlipidemia: Secondary | ICD-10-CM | POA: Diagnosis not present

## 2015-03-23 DIAGNOSIS — E079 Disorder of thyroid, unspecified: Secondary | ICD-10-CM | POA: Diagnosis not present

## 2015-03-23 DIAGNOSIS — M1990 Unspecified osteoarthritis, unspecified site: Secondary | ICD-10-CM | POA: Diagnosis not present

## 2015-03-23 DIAGNOSIS — E669 Obesity, unspecified: Secondary | ICD-10-CM | POA: Diagnosis not present

## 2015-03-23 DIAGNOSIS — R9431 Abnormal electrocardiogram [ECG] [EKG]: Secondary | ICD-10-CM | POA: Diagnosis not present

## 2015-03-23 DIAGNOSIS — R002 Palpitations: Secondary | ICD-10-CM | POA: Diagnosis not present

## 2015-03-23 DIAGNOSIS — R001 Bradycardia, unspecified: Secondary | ICD-10-CM | POA: Diagnosis not present

## 2015-03-23 DIAGNOSIS — I10 Essential (primary) hypertension: Secondary | ICD-10-CM | POA: Diagnosis not present

## 2015-04-15 DIAGNOSIS — E7800 Pure hypercholesterolemia, unspecified: Secondary | ICD-10-CM | POA: Diagnosis not present

## 2015-04-15 DIAGNOSIS — R7989 Other specified abnormal findings of blood chemistry: Secondary | ICD-10-CM | POA: Diagnosis not present

## 2015-04-15 DIAGNOSIS — E034 Atrophy of thyroid (acquired): Secondary | ICD-10-CM | POA: Diagnosis not present

## 2015-04-22 DIAGNOSIS — Z1159 Encounter for screening for other viral diseases: Secondary | ICD-10-CM | POA: Diagnosis not present

## 2015-04-22 DIAGNOSIS — Z79899 Other long term (current) drug therapy: Secondary | ICD-10-CM | POA: Diagnosis not present

## 2015-04-22 DIAGNOSIS — F419 Anxiety disorder, unspecified: Secondary | ICD-10-CM | POA: Diagnosis not present

## 2015-04-22 DIAGNOSIS — I10 Essential (primary) hypertension: Secondary | ICD-10-CM | POA: Diagnosis not present

## 2015-04-22 DIAGNOSIS — E7800 Pure hypercholesterolemia, unspecified: Secondary | ICD-10-CM | POA: Diagnosis not present

## 2015-04-22 DIAGNOSIS — R7989 Other specified abnormal findings of blood chemistry: Secondary | ICD-10-CM | POA: Diagnosis not present

## 2015-04-22 DIAGNOSIS — G4709 Other insomnia: Secondary | ICD-10-CM | POA: Diagnosis not present

## 2015-04-22 DIAGNOSIS — E034 Atrophy of thyroid (acquired): Secondary | ICD-10-CM | POA: Diagnosis not present

## 2015-04-22 DIAGNOSIS — R739 Hyperglycemia, unspecified: Secondary | ICD-10-CM | POA: Diagnosis not present

## 2015-07-07 DIAGNOSIS — Z9884 Bariatric surgery status: Secondary | ICD-10-CM | POA: Diagnosis not present

## 2015-07-07 DIAGNOSIS — K912 Postsurgical malabsorption, not elsewhere classified: Secondary | ICD-10-CM | POA: Diagnosis not present

## 2015-07-24 DIAGNOSIS — R7989 Other specified abnormal findings of blood chemistry: Secondary | ICD-10-CM | POA: Diagnosis not present

## 2015-07-24 DIAGNOSIS — Z1159 Encounter for screening for other viral diseases: Secondary | ICD-10-CM | POA: Diagnosis not present

## 2015-07-24 DIAGNOSIS — E7800 Pure hypercholesterolemia, unspecified: Secondary | ICD-10-CM | POA: Diagnosis not present

## 2015-07-24 DIAGNOSIS — Z79899 Other long term (current) drug therapy: Secondary | ICD-10-CM | POA: Diagnosis not present

## 2015-07-24 DIAGNOSIS — E034 Atrophy of thyroid (acquired): Secondary | ICD-10-CM | POA: Diagnosis not present

## 2015-07-24 DIAGNOSIS — R739 Hyperglycemia, unspecified: Secondary | ICD-10-CM | POA: Diagnosis not present

## 2015-08-26 DIAGNOSIS — R739 Hyperglycemia, unspecified: Secondary | ICD-10-CM | POA: Diagnosis not present

## 2015-08-26 DIAGNOSIS — I10 Essential (primary) hypertension: Secondary | ICD-10-CM | POA: Diagnosis not present

## 2015-08-26 DIAGNOSIS — E7800 Pure hypercholesterolemia, unspecified: Secondary | ICD-10-CM | POA: Diagnosis not present

## 2015-08-26 DIAGNOSIS — R0609 Other forms of dyspnea: Secondary | ICD-10-CM | POA: Diagnosis not present

## 2015-08-26 DIAGNOSIS — E034 Atrophy of thyroid (acquired): Secondary | ICD-10-CM | POA: Diagnosis not present

## 2015-10-01 DIAGNOSIS — E079 Disorder of thyroid, unspecified: Secondary | ICD-10-CM | POA: Diagnosis not present

## 2015-10-01 DIAGNOSIS — M1990 Unspecified osteoarthritis, unspecified site: Secondary | ICD-10-CM | POA: Diagnosis not present

## 2015-10-01 DIAGNOSIS — R002 Palpitations: Secondary | ICD-10-CM | POA: Diagnosis not present

## 2015-10-01 DIAGNOSIS — I10 Essential (primary) hypertension: Secondary | ICD-10-CM | POA: Diagnosis not present

## 2015-10-01 DIAGNOSIS — E669 Obesity, unspecified: Secondary | ICD-10-CM | POA: Diagnosis not present

## 2015-10-01 DIAGNOSIS — R9431 Abnormal electrocardiogram [ECG] [EKG]: Secondary | ICD-10-CM | POA: Diagnosis not present

## 2015-10-01 DIAGNOSIS — Z7689 Persons encountering health services in other specified circumstances: Secondary | ICD-10-CM | POA: Diagnosis not present

## 2015-10-01 DIAGNOSIS — E784 Other hyperlipidemia: Secondary | ICD-10-CM | POA: Diagnosis not present

## 2015-10-01 DIAGNOSIS — R001 Bradycardia, unspecified: Secondary | ICD-10-CM | POA: Diagnosis not present

## 2015-10-30 DIAGNOSIS — Z23 Encounter for immunization: Secondary | ICD-10-CM | POA: Diagnosis not present

## 2015-11-11 ENCOUNTER — Ambulatory Visit (INDEPENDENT_AMBULATORY_CARE_PROVIDER_SITE_OTHER): Payer: Medicare Other | Admitting: Family

## 2015-11-11 ENCOUNTER — Encounter: Payer: Self-pay | Admitting: Family

## 2015-11-11 VITALS — BP 140/75 | HR 77 | Temp 98.4°F | Ht 62.0 in | Wt 181.8 lb

## 2015-11-11 DIAGNOSIS — R7309 Other abnormal glucose: Secondary | ICD-10-CM | POA: Diagnosis not present

## 2015-11-11 DIAGNOSIS — Z7689 Persons encountering health services in other specified circumstances: Secondary | ICD-10-CM | POA: Diagnosis not present

## 2015-11-11 DIAGNOSIS — A6000 Herpesviral infection of urogenital system, unspecified: Secondary | ICD-10-CM | POA: Insufficient documentation

## 2015-11-11 DIAGNOSIS — I10 Essential (primary) hypertension: Secondary | ICD-10-CM

## 2015-11-11 DIAGNOSIS — J309 Allergic rhinitis, unspecified: Secondary | ICD-10-CM | POA: Insufficient documentation

## 2015-11-11 DIAGNOSIS — E785 Hyperlipidemia, unspecified: Secondary | ICD-10-CM | POA: Diagnosis not present

## 2015-11-11 DIAGNOSIS — Z0000 Encounter for general adult medical examination without abnormal findings: Secondary | ICD-10-CM | POA: Insufficient documentation

## 2015-11-11 DIAGNOSIS — J302 Other seasonal allergic rhinitis: Secondary | ICD-10-CM

## 2015-11-11 DIAGNOSIS — A6004 Herpesviral vulvovaginitis: Secondary | ICD-10-CM

## 2015-11-11 DIAGNOSIS — K219 Gastro-esophageal reflux disease without esophagitis: Secondary | ICD-10-CM

## 2015-11-11 LAB — COMPREHENSIVE METABOLIC PANEL
ALT: 99 U/L — ABNORMAL HIGH (ref 0–35)
AST: 152 U/L — ABNORMAL HIGH (ref 0–37)
Albumin: 4.6 g/dL (ref 3.5–5.2)
Alkaline Phosphatase: 70 U/L (ref 39–117)
BUN: 23 mg/dL (ref 6–23)
CO2: 30 mEq/L (ref 19–32)
Calcium: 10.1 mg/dL (ref 8.4–10.5)
Chloride: 102 mEq/L (ref 96–112)
Creatinine, Ser: 1.07 mg/dL (ref 0.40–1.20)
GFR: 65.93 mL/min (ref >60.00)
Glucose, Bld: 105 mg/dL — ABNORMAL HIGH (ref 70–99)
Potassium: 3.8 mEq/L (ref 3.5–5.1)
Sodium: 143 mEq/L (ref 135–145)
Total Bilirubin: 0.6 mg/dL (ref 0.2–1.2)
Total Protein: 7.8 g/dL (ref 6.0–8.3)

## 2015-11-11 LAB — LIPID PANEL
Cholesterol: 245 mg/dL — ABNORMAL HIGH (ref 0–200)
HDL: 54.6 mg/dL (ref >39.00)
Total CHOL/HDL Ratio: 4
Triglycerides: 666 mg/dL — ABNORMAL HIGH (ref 0.0–149.0)

## 2015-11-11 LAB — VITAMIN D 25 HYDROXY (VIT D DEFICIENCY, FRACTURES): VITD: 35.19 ng/mL (ref 30.00–100.00)

## 2015-11-11 LAB — TSH: TSH: 2.95 u[IU]/mL (ref 0.35–4.50)

## 2015-11-11 LAB — HEMOGLOBIN A1C: Hgb A1c MFr Bld: 6 % (ref 4.6–6.5)

## 2015-11-11 LAB — LDL CHOLESTEROL, DIRECT: Direct LDL: 83 mg/dL

## 2015-11-11 NOTE — Assessment & Plan Note (Signed)
Stable  Continue current regimen  

## 2015-11-11 NOTE — Assessment & Plan Note (Signed)
No fever. Duration 4 days. Patient and I agreed on conservative management. Advised Mucinex. Return precautions given.

## 2015-11-11 NOTE — Patient Instructions (Signed)
Alternate being using saline nasal spray and nasocort.   Continue to treat conservative treatment. Mucinex OTC.   Let me know if not better.  BP log.   F/u CPE.

## 2015-11-11 NOTE — Assessment & Plan Note (Signed)
Reviewed past medical, social, and family history with patient. Ordered fasting screening labs. Patient will return in 1 month for CPE. Requesting records.

## 2015-11-11 NOTE — Assessment & Plan Note (Signed)
At goal. Continue current regimen. She also follows with cardiology, Dr. Clayborn Bigness.

## 2015-11-11 NOTE — Assessment & Plan Note (Signed)
Stable on maintenance dose. We'll continue current regimen.

## 2015-11-11 NOTE — Progress Notes (Signed)
Subjective:    Patient ID: Jennifer [name], female    DOB: 12/31/1950, 65 y.o.   MRN: [id number]  CC: Jennifer [name] is a 65 y.o. female who presents today to establish care.  HPI: Patient presents to establish care as a new patient to our practice. Prior care had been with Dr.Teresa Nydia Bouton . We are requesting those records  HTN- Averages 127/70s at home. established with Dr. Carlyle Basques. Per patient had stress test 2016, normal. Echo 2016 which was normal per patient. No h/o HF. Told she had slow heart rate.   Genital herpes- Stable. On maintenance/suppression dose.   GERD- Controlled. No unintentional weightloss, difficulty swallowing.   Prediabetes- last a1c 5.8 07/2015. Managing with diet and has seen nutritionist few months ago.   Insomnia- on trazodone. Has tinnitus in right ear since she was 19; hard to fall asleep. No depression, anxiety.   Complains of runny nose and sinus congestion for past 4 days, improving. Endorses itchy eyes, sore throat.[name] KitchenHas seasonal allergies. Taking OTC zyrtec with some relief. No fever, cough, wheezing.   Follows with [facility]. Per patient, had Pap 2017, normal. No h/o GYN cancer.     HISTORY:  Past Medical History:  Diagnosis Date  . High cholesterol   . Hypertension   . Hypothyroidism    Past Surgical History:  Procedure Laterality Date  . ABDOMINAL HYSTERECTOMY    . bariatric sleeve  2015  . BREAST EXCISIONAL BIOPSY Left 1998  . carpal tunnel repair    . HEMORRHOID SURGERY     Family History  Problem Relation Age of Onset  . Breast cancer Sister 59    materal 1/2 sister    Allergies: Vicodin [hydrocodone-acetaminophen] Current Outpatient Prescriptions on File Prior to Visit  Medication Sig Dispense Refill  . acyclovir (ZOVIRAX) 400 MG tablet Take 400 mg by mouth every 4 (four) hours while awake.    [name] Kitchen amLODipine (NORVASC) 2.5 MG tablet Take 2.5 mg by mouth daily.    [name] Kitchen aspirin 81 MG tablet Take 81 mg by mouth  daily.    . Biotin 5000 MCG CAPS Take 5,000 mcg by mouth.    . cholecalciferol (VITAMIN D) 1000 UNITS tablet Take 1,000 Units by mouth daily.    [name] Kitchen levothyroxine (SYNTHROID, LEVOTHROID) 25 MCG tablet Take 25 mcg by mouth daily before breakfast.    . Pediatric Multivit-Minerals-C (GUMMI BEAR MULTIVITAMIN/MIN PO) Take by mouth.    . pravastatin (PRAVACHOL) 40 MG tablet Take 40 mg by mouth daily.     No current facility-administered medications on file prior to visit.     Social History  Substance Use Topics  . Smoking status: Former Research scientist (life sciences)  . Smokeless tobacco: Never Used  . Alcohol use 8.4 oz/week    14 Glasses of wine per week    Review of Systems  Constitutional: Negative for chills and fever.  Respiratory: Negative for cough.   Cardiovascular: Negative for chest pain and palpitations.  Gastrointestinal: Negative for nausea and vomiting.      Objective:    BP 140/75   Pulse 77   Temp 98.4 F (36.9 C) (Oral)   Ht 5\' 2"  (1.575 m)   Wt 181 lb 12.8 oz (82.5 kg)   SpO2 97%   BMI 33.25 kg/m  BP Readings from Last 3 Encounters:  11/11/15 140/75  09/03/14 (!) 153/89   Wt Readings from Last 3 Encounters:  11/11/15 181 lb 12.8 oz (82.5 kg)  09/03/14 170 lb (77.1 kg)  07/17/12 188  lb 6.4 oz (85.5 kg)    Physical Exam  Constitutional: She appears well-developed and well-nourished.  HENT:  Head: Normocephalic and atraumatic.  Right Ear: Hearing, tympanic membrane, external ear and ear canal normal. No drainage, swelling or tenderness. No foreign bodies. Tympanic membrane is not erythematous and not bulging. No middle ear effusion. No decreased hearing is noted.  Left Ear: Hearing, tympanic membrane, external ear and ear canal normal. No drainage, swelling or tenderness. No foreign bodies. Tympanic membrane is not erythematous and not bulging.  No middle ear effusion. No decreased hearing is noted.  Nose: Nose normal. No rhinorrhea. Right sinus exhibits no maxillary sinus  tenderness and no frontal sinus tenderness. Left sinus exhibits no maxillary sinus tenderness and no frontal sinus tenderness.  Mouth/Throat: Uvula is midline, oropharynx is clear and moist and mucous membranes are normal. No oropharyngeal exudate, posterior oropharyngeal edema, posterior oropharyngeal erythema or tonsillar abscesses.  Eyes: Conjunctivae are normal.  Cardiovascular: Normal rate, regular rhythm, normal heart sounds and normal pulses.   Pulmonary/Chest: Effort normal and breath sounds normal. She has no wheezes. She has no rhonchi. She has no rales.  Lymphadenopathy:       Head (right side): No submental, no submandibular, no tonsillar, no preauricular, no posterior auricular and no occipital adenopathy present.       Head (left side): No submental, no submandibular, no tonsillar, no preauricular, no posterior auricular and no occipital adenopathy present.    She has no cervical adenopathy.  Neurological: She is alert.  Skin: Skin is warm and dry.  Psychiatric: She has a normal mood and affect. Her speech is normal and behavior is normal. Thought content normal.  Vitals reviewed.      Assessment & Plan:   Problem List Items Addressed This Visit      Cardiovascular and Mediastinum   Essential hypertension    At goal. Continue current regimen. She also follows with cardiology, Dr. Clayborn Bigness.      Relevant Medications   valsartan-hydrochlorothiazide (DIOVAN-HCT) 160-12.5 MG tablet     Respiratory   Allergic rhinitis    No fever. Duration 4 days. Patient and I agreed on conservative management. Advised Mucinex. Return precautions given.        Digestive   GERD (gastroesophageal reflux disease)    Stable. Continue current regimen.      Relevant Medications   omeprazole (PRILOSEC) 20 MG capsule     Genitourinary   Genital herpes    Stable on maintenance dose. We'll continue current regimen.        Other   Hyperlipidemia   Relevant Medications    valsartan-hydrochlorothiazide (DIOVAN-HCT) 160-12.5 MG tablet   Encounter to establish care - Primary    Reviewed past medical, social, and family history with patient. Ordered fasting screening labs. Patient will return in 1 month for CPE. Requesting records.      Relevant Orders   Comprehensive metabolic panel   Hemoglobin A1c   Lipid panel   TSH   VITAMIN D 25 Hydroxy (Vit-D Deficiency, Fractures)    Other Visit Diagnoses   None.      I have discontinued Ms. Mccree's zolpidem, olmesartan-hydrochlorothiazide, fluocinonide, HYDROCODONE BITARTRATE ER PO, and FLUZONE HIGH-DOSE. I am also having her maintain her amLODipine, pravastatin, levothyroxine, acyclovir, Biotin, aspirin, cholecalciferol, Pediatric Multivit-Minerals-C (GUMMI BEAR MULTIVITAMIN/MIN PO), valsartan-hydrochlorothiazide, omeprazole, traZODone, triamcinolone, and Lifitegrast.   Meds ordered this encounter  Medications  . valsartan-hydrochlorothiazide (DIOVAN-HCT) 160-12.5 MG tablet    Sig: TAKE 1 TABLET DAILY  .  DISCONTD: FLUZONE HIGH-DOSE 0.5 ML SUSY    Sig: ADM 0.5ML IM UTD    Refill:  0  . omeprazole (PRILOSEC) 20 MG capsule    Sig: Take 20 mg by mouth daily.  . traZODone (DESYREL) 50 MG tablet    Sig: Take 50 mg by mouth at bedtime.  . triamcinolone (NASACORT AQ) 55 MCG/ACT AERO nasal inhaler    Sig: Place 2 sprays into the nose daily.  [name] Kitchen Lifitegrast (XIIDRA) 5 % SOLN    Sig: Apply to eye.    Return precautions given.   Risks, benefits, and alternatives of the medications and treatment plan prescribed today were discussed, and patient expressed understanding.   Education regarding symptom management and diagnosis given to patient on AVS.  Continue to follow with [name], FNP for routine health maintenance.   Jennifer [name] and I agreed with plan.   [name], FNP

## 2015-11-13 ENCOUNTER — Telehealth: Payer: Self-pay | Admitting: Family

## 2015-11-13 MED ORDER — VALSARTAN-HYDROCHLOROTHIAZIDE 160-12.5 MG PO TABS: 1.0000 | ORAL_TABLET | Freq: Every day | ORAL | 1 refills | Status: DC

## 2015-11-13 NOTE — Telephone Encounter (Signed)
Pt called requesting a medication refill on valsartan-hydrochlorothiazide (DIOVAN-HCT) 160-12.5 MG tablet. Pt normally use [facility] but would like a 30 days supply [facility] to [facility].  Pharmacy - [facility] [id number] [place], [place] [address]  Call pt @ [phone]

## 2015-11-13 NOTE — Telephone Encounter (Signed)
Medication has been refilled.

## 2015-11-25 ENCOUNTER — Ambulatory Visit (INDEPENDENT_AMBULATORY_CARE_PROVIDER_SITE_OTHER): Payer: Medicare Other | Admitting: Family

## 2015-11-25 ENCOUNTER — Encounter: Payer: Self-pay | Admitting: Family

## 2015-11-25 ENCOUNTER — Telehealth: Payer: Self-pay | Admitting: Family

## 2015-11-25 VITALS — BP 128/78 | HR 59 | Temp 98.5°F | Wt 184.1 lb

## 2015-11-25 DIAGNOSIS — J0120 Acute ethmoidal sinusitis, unspecified: Secondary | ICD-10-CM

## 2015-11-25 DIAGNOSIS — M791 Myalgia, unspecified site: Secondary | ICD-10-CM

## 2015-11-25 LAB — POCT INFLUENZA A/B
Influenza A, POC: NEGATIVE
Influenza B, POC: NEGATIVE

## 2015-11-25 MED ORDER — AMOXICILLIN-POT CLAVULANATE ER 1000-62.5 MG PO TB12: 2.0000 | ORAL_TABLET | Freq: Two times a day (BID) | ORAL | 0 refills | Status: DC

## 2015-11-25 NOTE — Telephone Encounter (Signed)
Pt called and stated that she is still having symptoms from when she saw M. Arnett on 10/25. She is having head congestion, sore throat, fatigue, and swollen glands under ear. Pt has been taking Mucinex and has been taking a generic allergy medication, she has had little relied. Please advise, thank you!  Call pt @ [phone]

## 2015-11-25 NOTE — Telephone Encounter (Signed)
Will come in at 230 today to be seen, thanks

## 2015-11-25 NOTE — Telephone Encounter (Signed)
Patient was seen on 10/25 to establish care, she was advised to use conservative measures, of saline and OTC mucinex.  She is still not feeling well, please advise? Need an appt or meds? Thanks she is due to return this month fora physical.

## 2015-11-25 NOTE — Patient Instructions (Signed)
Antibiotics.  Probiotics  Increase intake of clear fluids. Congestion is best treated by hydration, when mucus is wetter, it is thinner, less sticky, and easier to expel from the body, either through coughing up drainage, or by blowing your nose.   Get plenty of rest.   Use saline nasal drops and blow your nose frequently. Run a humidifier at night and elevate the head of the bed. Vicks Vapor rub will help with congestion and cough. Steam showers and sinus massage for congestion.   Use Acetaminophen or Ibuprofen as needed for fever or pain. Avoid second hand smoke. Even the smallest exposure will worsen symptoms.   Over the counter medications you can try include Delsym for cough, a decongestant for congestion, and Mucinex or Robitussin as an expectorant. Be sure to just get the plain Mucinex or Robitussin that just has one medication (Guaifenesen). We don't recommend the combination products. Note, be sure to drink two glasses of water with each dose of Mucinex as the medication will not work well without adequate hydration.   You can also try a teaspoon of honey to see if this will help reduce cough. Throat lozenges can sometimes be beneficial as well.    This illness will typically last 7 - 10 days.   Please follow up with our clinic if you develop a fever greater than 101 F, symptoms worsen, or do not resolve in the next week.

## 2015-11-25 NOTE — Telephone Encounter (Signed)
Pt requested a update, she stated that she feels as if she has the flu Pt contact [phone]

## 2015-11-25 NOTE — Progress Notes (Signed)
Subjective:    Patient ID: Jennifer [name], female    DOB: 06/14/1950, 65 y.o.   MRN: [id number]  CC: Jennifer [name] is a 65 y.o. female who presents today for an acute visit.    HPI: Patient here for acute visit with CC cough and sinus congestion and pressure, worsening over past 4-5 days. Seen over a week ago for congestion and advised conservative therapy at that which is not working. No wheezing, SOB, fever. . Endorses body aches, chills, ears and eyes itching. Tried tylenol, ASA, zrytec without resolve.    No h/o lung disease.    HISTORY:  Past Medical History:  Diagnosis Date  . High cholesterol   . Hypertension   . Hypothyroidism    Past Surgical History:  Procedure Laterality Date  . ABDOMINAL HYSTERECTOMY    . bariatric sleeve  2015  . BREAST EXCISIONAL BIOPSY Left 1998  . carpal tunnel repair    . HEMORRHOID SURGERY     Family History  Problem Relation Age of Onset  . Breast cancer Sister 22    materal 1/2 sister    Allergies: Vicodin [hydrocodone-acetaminophen] Current Outpatient Prescriptions on File Prior to Visit  Medication Sig Dispense Refill  . acyclovir (ZOVIRAX) 400 MG tablet Take 400 mg by mouth every 4 (four) hours while awake.    [name] Kitchen amLODipine (NORVASC) 2.5 MG tablet Take 2.5 mg by mouth daily.    [name] Kitchen aspirin 81 MG tablet Take 81 mg by mouth daily.    . Biotin 5000 MCG CAPS Take 5,000 mcg by mouth.    . cholecalciferol (VITAMIN D) 1000 UNITS tablet Take 1,000 Units by mouth daily.    [name] Kitchen levothyroxine (SYNTHROID, LEVOTHROID) 25 MCG tablet Take 25 mcg by mouth daily before breakfast.    . Lifitegrast (XIIDRA) 5 % SOLN Apply to eye.    [name] Kitchen omeprazole (PRILOSEC) 20 MG capsule Take 20 mg by mouth daily.    . pravastatin (PRAVACHOL) 40 MG tablet Take 40 mg by mouth daily.    . traZODone (DESYREL) 50 MG tablet Take 50 mg by mouth at bedtime.    . triamcinolone (NASACORT AQ) 55 MCG/ACT AERO nasal inhaler Place 2 sprays into the nose daily.    .  valsartan-hydrochlorothiazide (DIOVAN-HCT) 160-12.5 MG tablet Take 1 tablet by mouth daily. 90 tablet 1  . Pediatric Multivit-Minerals-C (GUMMI BEAR MULTIVITAMIN/MIN PO) Take by mouth.     No current facility-administered medications on file prior to visit.     Social History  Substance Use Topics  . Smoking status: Former Research scientist (life sciences)  . Smokeless tobacco: Never Used  . Alcohol use 8.4 oz/week    14 Glasses of wine per week    Review of Systems  Constitutional: Negative for chills and fever.  HENT: Positive for congestion, sinus pressure and sore throat. Negative for ear pain.   Eyes: Positive for itching.  Respiratory: Positive for cough. Negative for shortness of breath and wheezing.   Cardiovascular: Negative for chest pain and palpitations.  Gastrointestinal: Negative for nausea and vomiting.      Objective:    BP 128/78 (BP Location: Left Arm, Patient Position: Sitting, Cuff Size: Normal)   Pulse (!) 59   Temp 98.5 F (36.9 C) (Oral)   Wt 184 lb 2 oz (83.5 kg)   SpO2 98%   BMI 33.68 kg/m    Physical Exam  Constitutional: She appears well-developed and well-nourished.  HENT:  Head: Normocephalic and atraumatic.  Right Ear: Hearing, tympanic membrane, external ear and  ear canal normal. No drainage, swelling or tenderness. No foreign bodies. Tympanic membrane is not erythematous and not bulging. No middle ear effusion. No decreased hearing is noted.  Left Ear: Hearing, tympanic membrane, external ear and ear canal normal. No drainage, swelling or tenderness. No foreign bodies. Tympanic membrane is not erythematous and not bulging.  No middle ear effusion. No decreased hearing is noted.  Nose: Nose normal. No rhinorrhea. Right sinus exhibits no maxillary sinus tenderness and no frontal sinus tenderness. Left sinus exhibits no maxillary sinus tenderness and no frontal sinus tenderness.  Mouth/Throat: Uvula is midline, oropharynx is clear and moist and mucous membranes are normal.  No trismus in the jaw. No uvula swelling. No oropharyngeal exudate, posterior oropharyngeal edema, posterior oropharyngeal erythema or tonsillar abscesses.  Bilateral tenderness noted over parotid glands. Slightly enlarged. No pain or swelling over mandible. No increased warmth, erythema.   Eyes: Conjunctivae are normal.  Cardiovascular: Regular rhythm, normal heart sounds and normal pulses.   Pulmonary/Chest: Effort normal and breath sounds normal. She has no wheezes. She has no rhonchi. She has no rales.  Lymphadenopathy:       Head (right side): No submental, no submandibular, no tonsillar, no preauricular, no posterior auricular and no occipital adenopathy present.       Head (left side): No submental, no submandibular, no tonsillar, no preauricular, no posterior auricular and no occipital adenopathy present.    She has no cervical adenopathy.  Neurological: She is alert.  Skin: Skin is warm and dry.  Psychiatric: She has a normal mood and affect. Her speech is normal and behavior is normal. Thought content normal.  Vitals reviewed.      Assessment & Plan:   1. Myalgia Afebrile. Neg flu.  - POCT Influenza A/B  2. Acute non-recurrent ethmoidal sinusitis Based on duration of symptoms, patient and I jointly agreed to start antibiotics. Noted slight enlargement bilaterally of parotids. No trismus, fever. Patient does not appear septic and was born in [name] with childhood vaccine, making mumps very unlikely. Atypical to present bilaterally for suppurative parotitis. Dicussed this with patient and should will remain very vigilant and let me know if not better. Return precautions given.   - amoxicillin-clavulanate (AUGMENTIN XR) 1000-62.5 MG 12 hr tablet; Take 2 tablets by mouth 2 (two) times daily.  Dispense: 14 tablet; Refill: 0    I am having Jennifer [name] maintain her amLODipine, pravastatin, levothyroxine, acyclovir, Biotin, aspirin, cholecalciferol, Pediatric Multivit-Minerals-C (GUMMI  BEAR MULTIVITAMIN/MIN PO), omeprazole, traZODone, triamcinolone, Lifitegrast, valsartan-hydrochlorothiazide, acetaminophen, and multivitamin with minerals.   Meds ordered this encounter  Medications  . acetaminophen (TYLENOL) 500 MG tablet    Sig: Take 500 mg by mouth.  . Multiple Vitamins-Minerals (MULTIVITAMIN WITH MINERALS) tablet    Sig: Take 1 tablet by mouth daily.    Return precautions given.   Risks, benefits, and alternatives of the medications and treatment plan prescribed today were discussed, and patient expressed understanding.   Education regarding symptom management and diagnosis given to patient on AVS.  Continue to follow with [name], FNP for routine health maintenance.   Jennifer [name] and I agreed with plan.   [name], FNP

## 2015-11-26 ENCOUNTER — Telehealth: Payer: Self-pay | Admitting: Family

## 2015-11-26 NOTE — Telephone Encounter (Signed)
Pt called and was calling with an update. She said that she is getting along slowly, she started medication last night and took 2 yesterday, and 2 today. Thank you!  Call pt @ [phone]

## 2015-11-27 NOTE — Progress Notes (Signed)
Spoke with patient this am states glands still swollen left more than right , still congested , still has a lot of sinus congestion , jaws still swollen, denies fever.    Patient states she feels antibiotic maybe helping some.   She has not started taking the Mucinex .

## 2015-11-29 NOTE — Telephone Encounter (Signed)
Please call and see how patient is doing with sinus infection.   thanks

## 2015-11-30 NOTE — Telephone Encounter (Signed)
Patient stated that she is feeling better but it is still lingering. Also patient is wondering if she can do anything for her watery eyes? Please advise.

## 2015-11-30 NOTE — Telephone Encounter (Signed)
If itchy and watery, may try OTC antihistamine drops.   Otherwise, likely viral and will run their course.

## 2015-12-01 NOTE — Telephone Encounter (Signed)
Pt called back returning your call. Thank you!  Call pt @ [phone]

## 2015-12-01 NOTE — Telephone Encounter (Signed)
Left message for patient to return call back.  

## 2015-12-02 NOTE — Telephone Encounter (Signed)
Patient has been informed.

## 2015-12-07 ENCOUNTER — Encounter: Payer: Self-pay | Admitting: Family

## 2015-12-07 ENCOUNTER — Ambulatory Visit: Payer: Medicare Other | Admitting: Pharmacist

## 2015-12-07 ENCOUNTER — Other Ambulatory Visit: Payer: Self-pay | Admitting: Family

## 2015-12-07 ENCOUNTER — Encounter: Payer: Self-pay | Admitting: Pharmacist

## 2015-12-07 VITALS — BP 156/82 | HR 63 | Wt 183.0 lb

## 2015-12-07 DIAGNOSIS — Z9884 Bariatric surgery status: Secondary | ICD-10-CM | POA: Insufficient documentation

## 2015-12-07 DIAGNOSIS — R7401 Elevation of levels of liver transaminase levels: Secondary | ICD-10-CM

## 2015-12-07 DIAGNOSIS — R740 Nonspecific elevation of levels of transaminase and lactic acid dehydrogenase [LDH]: Principal | ICD-10-CM

## 2015-12-07 DIAGNOSIS — E785 Hyperlipidemia, unspecified: Secondary | ICD-10-CM

## 2015-12-07 NOTE — Patient Instructions (Addendum)

## 2015-12-07 NOTE — Progress Notes (Addendum)
Patient ID: Jennifer [name]                 DOB: 10-02-1950                    MRN: [id number]   HPI: Jennifer [name] is a 65 y.o. female patient referred to pharmacist clinic by Bernette Redbird, FNP due to elevated triglycerides at her last visit on 11/11/2015.  Today patient states that prior to that visit she had yogurt with splenda and a premier protein drink at 230 am prior to her labs at 830 am on 11/11/15.  She also had just returned from a 5 day vacation.  She also reports she was previously referred for an ultrasound due to elevated liver enzymes and was told this may be due to fatty liver.   Current Medications: Pravastatin 40 mg daily  Intolerances: None LDL goal: <100 mg/dL  Reports she did take her medications this AM.  Reports previous MD instructed her to take an extra BP pill.  She has taken an extra tablet 2x over the past 3 weeks.  She does record her BP every day.  Denies s/sx of orthostasis.   Diet: Bariatric Surgery in 2015.  Per MD should keep protein 60-70 Eggs, cheese, yogurt, mostly chicken, fish (mostly grilled or baked, sometimes fried every couple weeks), salads, vegetables (spinach, greens, squash).  Drinks: Water, crystal light, protein drink 2x/day   Exercise: Limited by joint/back pain. Walking on treadmill 2x/week   Family History: Mother (MI early 36s) and Father (MI in late 23 early 72s)  Social History: Drinks 1-2 glasses of wine per day   Labs: Lipid Panel     Component Value Date/Time   CHOL 245 (H) 11/11/2015 0828   TRIG (H) 11/11/2015 0828    666.0 Triglyceride is over 400; calculations on Lipids are invalid.   HDL 54.60 11/11/2015 0828   CHOLHDL 4 11/11/2015 0828   LDLDIRECT 83.0 11/11/2015 0828   Vitals:   12/07/15 0906 12/07/15 0957  BP: (!) 162/82 (!) 156/82  Pulse: 63     Past Medical History:  Diagnosis Date  . High cholesterol   . Hypertension   . Hypothyroidism     Current Outpatient Prescriptions on File Prior to Visit   Medication Sig Dispense Refill  . acetaminophen (TYLENOL) 500 MG tablet Take 500 mg by mouth.    [name] Kitchen acyclovir (ZOVIRAX) 400 MG tablet Take 400 mg by mouth every 4 (four) hours while awake.    [name] Kitchen amLODipine (NORVASC) 2.5 MG tablet Take 2.5 mg by mouth daily.    [name] Kitchen amoxicillin-clavulanate (AUGMENTIN XR) 1000-62.5 MG 12 hr tablet Take 2 tablets by mouth 2 (two) times daily. 14 tablet 0  . aspirin 81 MG tablet Take 81 mg by mouth daily.    . Biotin 5000 MCG CAPS Take 5,000 mcg by mouth.    . cholecalciferol (VITAMIN D) 1000 UNITS tablet Take 1,000 Units by mouth daily.    [name] Kitchen levothyroxine (SYNTHROID, LEVOTHROID) 25 MCG tablet Take 25 mcg by mouth daily before breakfast.    . Lifitegrast (XIIDRA) 5 % SOLN Apply to eye.    . Multiple Vitamins-Minerals (MULTIVITAMIN WITH MINERALS) tablet Take 1 tablet by mouth daily.    [name] Kitchen omeprazole (PRILOSEC) 20 MG capsule Take 20 mg by mouth daily.    . Pediatric Multivit-Minerals-C (GUMMI BEAR MULTIVITAMIN/MIN PO) Take by mouth.    . pravastatin (PRAVACHOL) 40 MG tablet Take 40 mg by mouth daily.    [name] Kitchen  traZODone (DESYREL) 50 MG tablet Take 50 mg by mouth at bedtime.    . triamcinolone (NASACORT AQ) 55 MCG/ACT AERO nasal inhaler Place 2 sprays into the nose daily.    . valsartan-hydrochlorothiazide (DIOVAN-HCT) 160-12.5 MG tablet Take 1 tablet by mouth daily. 90 tablet 1   No current facility-administered medications on file prior to visit.     Allergies  Allergen Reactions  . Vicodin [Hydrocodone-Acetaminophen] Nausea Only    Assessment/Plan: Elevated Triglycerides: Currently >500 mg/dL but lab draw may not have been fasting.  Will plan to recheck FASTING lipid panel tomorrow with f/u clinic visit with Bernette Redbird, FNP.  Discussed foods that may increase triglycerides.  Would consider Lovaza 2g twice daily pending repeat fasting Triglyceride level. Would continue to monitor AST/ALT as fish oil can increase AST/ALT but not as likely as fibrates.    Hypertension: Currently above goal on Valsartan/HCTZ 160/12.5 mg daily.  Patient has been taking extra tablet as needed for elevated blood pressure (2 times past 3 weeks) per her previous provider. Instructed patient to bring blood pressure log to visit tomorrow and may consider increasing dose of valsartan or addition of amlodipine as she has been on this in the past.   Elevated AST/ALT: Ultrasound Hepatic Elastography in 2015 showing fibrosis score of F1/F2. AST/ALT may be elevated due to her being on vacation with increased EtOH usage?  Will repeat labs with her visit with FNP tomorrow.   Patient was seen by [name], [place] today in clinic and assessment and plan was discussed.  Patient will followup with FNP tomorrow for fasting labs and clinic visit.   Bennye Alm, PharmD [facility] PGY2 Pharmacy Resident [phone]  Agree with above. Will see patient tomorrow and recheck BP.

## 2015-12-08 ENCOUNTER — Ambulatory Visit (INDEPENDENT_AMBULATORY_CARE_PROVIDER_SITE_OTHER): Payer: Medicare Other | Admitting: Family

## 2015-12-08 ENCOUNTER — Encounter: Payer: Self-pay | Admitting: Family

## 2015-12-08 VITALS — BP 160/80 | HR 70 | Temp 97.8°F | Ht 62.0 in | Wt 181.2 lb

## 2015-12-08 DIAGNOSIS — E785 Hyperlipidemia, unspecified: Secondary | ICD-10-CM | POA: Diagnosis not present

## 2015-12-08 DIAGNOSIS — A6004 Herpesviral vulvovaginitis: Secondary | ICD-10-CM

## 2015-12-08 DIAGNOSIS — M1990 Unspecified osteoarthritis, unspecified site: Secondary | ICD-10-CM | POA: Insufficient documentation

## 2015-12-08 DIAGNOSIS — I10 Essential (primary) hypertension: Secondary | ICD-10-CM | POA: Diagnosis not present

## 2015-12-08 DIAGNOSIS — R748 Abnormal levels of other serum enzymes: Secondary | ICD-10-CM

## 2015-12-08 DIAGNOSIS — K760 Fatty (change of) liver, not elsewhere classified: Secondary | ICD-10-CM | POA: Insufficient documentation

## 2015-12-08 DIAGNOSIS — Z0000 Encounter for general adult medical examination without abnormal findings: Secondary | ICD-10-CM

## 2015-12-08 LAB — LIPID PANEL
Cholesterol: 224 mg/dL — ABNORMAL HIGH (ref 0–200)
HDL: 73.6 mg/dL (ref >39.00)
LDL Cholesterol: 118 mg/dL — ABNORMAL HIGH (ref 0–99)
NonHDL: 150.81
Total CHOL/HDL Ratio: 3
Triglycerides: 165 mg/dL — ABNORMAL HIGH (ref 0.0–149.0)
VLDL: 33 mg/dL (ref 0.0–40.0)

## 2015-12-08 LAB — COMPREHENSIVE METABOLIC PANEL
ALT: 49 U/L — ABNORMAL HIGH (ref 0–35)
AST: 53 U/L — ABNORMAL HIGH (ref 0–37)
Albumin: 4.7 g/dL (ref 3.5–5.2)
Alkaline Phosphatase: 57 U/L (ref 39–117)
BUN: 11 mg/dL (ref 6–23)
CO2: 31 mEq/L (ref 19–32)
Calcium: 10 mg/dL (ref 8.4–10.5)
Chloride: 99 mEq/L (ref 96–112)
Creatinine, Ser: 0.78 mg/dL (ref 0.40–1.20)
GFR: 94.94 mL/min (ref >60.00)
Glucose, Bld: 114 mg/dL — ABNORMAL HIGH (ref 70–99)
Potassium: 3.6 mEq/L (ref 3.5–5.1)
Sodium: 139 mEq/L (ref 135–145)
Total Bilirubin: 0.7 mg/dL (ref 0.2–1.2)
Total Protein: 7.8 g/dL (ref 6.0–8.3)

## 2015-12-08 LAB — LIPASE: Lipase: 15 U/L (ref 11.0–59.0)

## 2015-12-08 MED ORDER — ACYCLOVIR 400 MG PO TABS: 400.0000 mg | ORAL_TABLET | Freq: Every day | ORAL | 1 refills | Status: DC

## 2015-12-08 MED ORDER — AMLODIPINE BESYLATE 5 MG PO TABS: 5.0000 mg | ORAL_TABLET | Freq: Every day | ORAL | 1 refills | Status: DC

## 2015-12-08 MED ORDER — TRAZODONE HCL 50 MG PO TABS: 50.0000 mg | ORAL_TABLET | Freq: Every day | ORAL | 1 refills | Status: DC

## 2015-12-08 NOTE — Assessment & Plan Note (Signed)
Continue current regimen for now however have concern, as discussed with patient,that acyclovir may be raising liver enzymes. Unfortunately, it appears difficult to take patient off this medication as she reports flare when she's come off of it in the past. Will continue to discuss with patient.

## 2015-12-08 NOTE — Assessment & Plan Note (Signed)
Not at goal. Start CCB. Follow up in 2 weeks.

## 2015-12-08 NOTE — Assessment & Plan Note (Signed)
Pending lipid panel. Will consider fenofibrate if AST/ALT normal. Education provided on diet.

## 2015-12-08 NOTE — Progress Notes (Signed)
Subjective:    Patient ID: Jennifer [name], female    DOB: Oct 27, 1950, 65 y.o.   MRN: [id number]  CC: Jennifer [name] is a 65 y.o. female who presents today for physical exam.    HPI: Here for CPE.  HTN- On diovan HCT. Denies exertional chest pain or pressure, numbness or tingling radiating to left arm or jaw, palpitations, dizziness, frequent headaches, changes in vision, or shortness of breath.   HLD- On pravastatin.    Elevated liver enzymes- no pain with meals. Has gallbladder. Pending RUQ [name], labs.  Insomnia - trazodone working well. No depression currently.   Chronic bilateral knee and shoulder pain- Using icy hot. Worsening with weight gain.   Genital herpes- has to take suppressive dose or will get flare.     Colorectal Cancer Screening: per patient 2015 by Dr Jennifer [name] to repeat in 5 years.  Breast Cancer Screening: Mammogram UTD Cervical Cancer Screening: UTD.  History of abdominal hysterectomy. No history of GYN cancer or abnormal Pap smears. Follows with [facility]. Pap 2017 normal per patient.  Bone Health screening/DEXA for 65+: No increased fracture risk. Defer screening at this time. Lung Cancer Screening: Former smoker. Doesn't have 30 year pack year history and age > 87 years.   Immunizations       Tetanus - believes UTD within last 5 years        Pneumococcal - Per patient, done in [place].  Labs: Screening labs today. Exercise: Gets regular exercise. Walking.  Alcohol use: Occasional Smoking/tobacco use: Nonsmoker.    HISTORY:  Past Medical History:  Diagnosis Date  . High cholesterol   . Hypertension   . Hypothyroidism     Past Surgical History:  Procedure Laterality Date  . ABDOMINAL HYSTERECTOMY    . bariatric sleeve  2015  . BREAST EXCISIONAL BIOPSY Left 1998  . carpal tunnel repair    . HEMORRHOID SURGERY     Family History  Problem Relation Age of Onset  . Breast cancer Sister 37    materal 1/2 sister      ALLERGIES: Nasacort  [triamcinolone] and Vicodin [hydrocodone-acetaminophen]  Current Outpatient Prescriptions on File Prior to Visit  Medication Sig Dispense Refill  . acetaminophen (TYLENOL) 500 MG tablet Take 1,000 mg by mouth every 6 (six) hours as needed.     [name] Kitchen aspirin 81 MG tablet Take 81 mg by mouth daily.    . Biotin 5000 MCG CAPS Take 5,000 mcg by mouth daily.     . cholecalciferol (VITAMIN D) 1000 UNITS tablet Take 400 Units by mouth daily.     [name] Kitchen levothyroxine (SYNTHROID, LEVOTHROID) 25 MCG tablet Take 25 mcg by mouth daily before breakfast.    . Lifitegrast (XIIDRA) 5 % SOLN Apply 1 drop to eye daily as needed.     . Multiple Vitamins-Minerals (MULTIVITAMIN WITH MINERALS) tablet Take 1 tablet by mouth daily.    [name] Kitchen omeprazole (PRILOSEC) 20 MG capsule Take 20 mg by mouth daily.    . pravastatin (PRAVACHOL) 40 MG tablet Take 40 mg by mouth daily.    . sodium chloride (OCEAN) 0.65 % SOLN nasal spray Place 1 spray into both nostrils as needed for congestion.    . valsartan-hydrochlorothiazide (DIOVAN-HCT) 160-12.5 MG tablet Take 1 tablet by mouth daily. 90 tablet 1   No current facility-administered medications on file prior to visit.     Social History  Substance Use Topics  . Smoking status: Former Research scientist (life sciences)  . Smokeless tobacco: Never Used  . Alcohol  use 8.4 oz/week    14 Glasses of wine per week    Review of Systems  Constitutional: Negative for chills, fever and unexpected weight change.  HENT: Negative for congestion.   Respiratory: Negative for cough.   Cardiovascular: Negative for chest pain, palpitations and leg swelling.  Gastrointestinal: Negative for nausea and vomiting.  Musculoskeletal: Negative for arthralgias and myalgias.  Skin: Negative for rash.  Neurological: Negative for headaches.  Hematological: Negative for adenopathy.  Psychiatric/Behavioral: Negative for confusion.      Objective:    BP (!) 160/80   Pulse 70   Temp 97.8 F (36.6 C) (Oral)   Ht 5\' 2"  (1.575 m)    Wt 181 lb 3.2 oz (82.2 kg)   SpO2 98%   BMI 33.14 kg/m   BP Readings from Last 3 Encounters:  12/08/15 (!) 160/80  12/07/15 (!) 156/82  11/25/15 128/78   Wt Readings from Last 3 Encounters:  12/08/15 181 lb 3.2 oz (82.2 kg)  12/07/15 183 lb (83 kg)  11/25/15 184 lb 2 oz (83.5 kg)    Physical Exam  Constitutional: She appears well-developed and well-nourished.  Eyes: Conjunctivae are normal.  Neck: No thyroid mass and no thyromegaly present.  Cardiovascular: Normal rate, regular rhythm, normal heart sounds and normal pulses.   Pulmonary/Chest: Effort normal and breath sounds normal. She has no wheezes. She has no rhonchi. She has no rales. Right breast exhibits no inverted nipple, no mass, no nipple discharge, no skin change and no tenderness. Left breast exhibits no inverted nipple, no mass, no nipple discharge, no skin change and no tenderness. Breasts are symmetrical.  CBE performed.   Lymphadenopathy:       Head (right side): No submental, no submandibular, no tonsillar, no preauricular, no posterior auricular and no occipital adenopathy present.       Head (left side): No submental, no submandibular, no tonsillar, no preauricular, no posterior auricular and no occipital adenopathy present.    She has no cervical adenopathy.       Right cervical: No superficial cervical, no deep cervical and no posterior cervical adenopathy present.      Left cervical: No superficial cervical, no deep cervical and no posterior cervical adenopathy present.    She has no axillary adenopathy.  Neurological: She is alert.  Skin: Skin is warm and dry.  Psychiatric: She has a normal mood and affect. Her speech is normal and behavior is normal. Thought content normal.  Vitals reviewed.      Assessment & Plan:   Problem List Items Addressed This Visit      Cardiovascular and Mediastinum   Essential hypertension    Not at goal. Start CCB. Follow up in 2 weeks.       Relevant Medications    amLODipine (NORVASC) 5 MG tablet     Musculoskeletal and Integument   Arthritis    Chronic. Prefers topical medication and exercise at this time.         Genitourinary   Genital herpes    Continue current regimen for now however have concern, as discussed with patient,that acyclovir may be raising liver enzymes. Unfortunately, it appears difficult to take patient off this medication as she reports flare when she's come off of it in the past. Will continue to discuss with patient.       Relevant Medications   acyclovir (ZOVIRAX) 400 MG tablet     Other   Hyperlipidemia    Pending lipid panel. Will consider fenofibrate if  AST/ALT normal. Education provided on diet.       Relevant Medications   amLODipine (NORVASC) 5 MG tablet   Routine physical examination - Primary    Due for colonoscopy for our records, referral placed. Mammogram up-to-date. Patient states Pap smear was done ( no records) this year she continues to follow with [facility]. Defer DEXA at this time. Former smoker, referral placed for CT chest. We do not have records immunizations; patient states she believes up-to-date with tetanus and pneumococcal, she will get these records for [name]. Continues to c/o snoring, referral to re-evaluate for OSA. Encouraged continued exercise and advised water aerobics.      Relevant Medications   acyclovir (ZOVIRAX) 400 MG tablet   traZODone (DESYREL) 50 MG tablet   Other Relevant Orders   CT CHEST LUNG CANCER SCREENING LOW DOSE WO CONTRAST   Ambulatory referral to Sleep Studies   Ambulatory referral to Gastroenterology   Elevated liver enzymes    Symptomatically stable .Pending labs and [name]. H/o bariatric surgery.       Relevant Orders   Comprehensive metabolic panel   Lipase   Lipid panel       I have changed Ms. Nicolosi's acyclovir and traZODone. I am also having her start on amLODipine. Additionally, I am having her maintain her pravastatin, levothyroxine, Biotin,  aspirin, cholecalciferol, omeprazole, Lifitegrast, valsartan-hydrochlorothiazide, acetaminophen, multivitamin with minerals, and sodium chloride.   Meds ordered this encounter  Medications  . amLODipine (NORVASC) 5 MG tablet    Sig: Take 1 tablet (5 mg total) by mouth daily.    Dispense:  90 tablet    Refill:  1    Order Specific Question:   Supervising Provider    Answer:   Deborra Medina L [[id number]]  . acyclovir (ZOVIRAX) 400 MG tablet    Sig: Take 1 tablet (400 mg total) by mouth daily.    Dispense:  90 tablet    Refill:  1    Order Specific Question:   Supervising Provider    Answer:   Deborra Medina L [[id number]]  . traZODone (DESYREL) 50 MG tablet    Sig: Take 1 tablet (50 mg total) by mouth at bedtime.    Dispense:  90 tablet    Refill:  1    Order Specific Question:   Supervising Provider    Answer:   Crecencio Mc [[id number]]    Return precautions given.   Risks, benefits, and alternatives of the medications and treatment plan prescribed today were discussed, and patient expressed understanding.   Education regarding symptom management and diagnosis given to patient on AVS.   Continue to follow with [name], FNP for routine health maintenance.   Jennifer [name] and I agreed with plan.   [name], FNP

## 2015-12-08 NOTE — Assessment & Plan Note (Signed)
Chronic. Prefers topical medication and exercise at this time.

## 2015-12-08 NOTE — Assessment & Plan Note (Signed)
Due for colonoscopy for our records, referral placed. Mammogram up-to-date. Patient states Pap smear was done ( no records) this year she continues to follow with [facility]. Defer DEXA at this time. Former smoker, referral placed for CT chest. We do not have records immunizations; patient states she believes up-to-date with tetanus and pneumococcal, she will get these records for [name]. Continues to c/o snoring, referral to re-evaluate for OSA. Encouraged continued exercise and advised water aerobics.

## 2015-12-08 NOTE — Assessment & Plan Note (Signed)
Symptomatically stable .Pending labs and [name]. H/o bariatric surgery.

## 2015-12-08 NOTE — Patient Instructions (Addendum)
Lab today. [name] of RUQ  Sleep study New blood pressure medication- follow up in 2 weeks to recheck BP.  Bring vaccine records by our office.  Appear  Due for tetanus and pneumococcal vaccine.    Over-the-counter medications you may try for arthritic pain include:   ThermaCare patches   Capsaicin cream   Icy hot   Health Maintenance for Postmenopausal Women Introduction Menopause is a normal process in which your reproductive ability comes to an end. This process happens gradually over a span of months to years, usually between the ages of 53 and 64. Menopause is complete when you have missed 12 consecutive menstrual periods. It is important to talk with your health care provider about some of the most common conditions that affect postmenopausal women, such as heart disease, cancer, and bone loss (osteoporosis). Adopting a healthy lifestyle and getting preventive care can help to promote your health and wellness. Those actions can also lower your chances of developing some of these common conditions. What should I know about menopause? During menopause, you may experience a number of symptoms, such as:  Moderate-to-severe hot flashes.  Night sweats.  Decrease in sex drive.  Mood swings.  Headaches.  Tiredness.  Irritability.  Memory problems.  Insomnia. Choosing to treat or not to treat menopausal changes is an individual decision that you make with your health care provider. What should I know about hormone replacement therapy and supplements? Hormone therapy products are effective for treating symptoms that are associated with menopause, such as hot flashes and night sweats. Hormone replacement carries certain risks, especially as you become older. If you are thinking about using estrogen or estrogen with progestin treatments, discuss the benefits and risks with your health care provider. What should I know about heart disease and stroke? Heart disease, heart attack, and  stroke become more likely as you age. This may be due, in part, to the hormonal changes that your body experiences during menopause. These can affect how your body processes dietary fats, triglycerides, and cholesterol. Heart attack and stroke are both medical emergencies. There are many things that you can do to help prevent heart disease and stroke:  Have your blood pressure checked at least every 1-2 years. High blood pressure causes heart disease and increases the risk of stroke.  If you are 18-21 years old, ask your health care provider if you should take aspirin to prevent a heart attack or a stroke.  Do not use any tobacco products, including cigarettes, chewing tobacco, or electronic cigarettes. If you need help quitting, ask your health care provider.  It is important to eat a healthy diet and maintain a healthy weight.  Be sure to include plenty of vegetables, fruits, low-fat dairy products, and lean protein.  Avoid eating foods that are high in solid fats, added sugars, or salt (sodium).  Get regular exercise. This is one of the most important things that you can do for your health.  Try to exercise for at least 150 minutes each week. The type of exercise that you do should increase your heart rate and make you sweat. This is known as moderate-intensity exercise.  Try to do strengthening exercises at least twice each week. Do these in addition to the moderate-intensity exercise.  Know your numbers.Ask your health care provider to check your cholesterol and your blood glucose. Continue to have your blood tested as directed by your health care provider. What should I know about cancer screening? There are several types of cancer.  Take the following steps to reduce your risk and to catch any cancer development as early as possible. Breast Cancer  Practice breast self-awareness.  This means understanding how your breasts normally appear and feel.  It also means doing regular  breast self-exams. Let your health care provider know about any changes, no matter how small.  If you are 68 or older, have a clinician do a breast exam (clinical breast exam or CBE) every year. Depending on your age, family history, and medical history, it may be recommended that you also have a yearly breast X-ray (mammogram).  If you have a family history of breast cancer, talk with your health care provider about genetic screening.  If you are at high risk for breast cancer, talk with your health care provider about having an MRI and a mammogram every year.  Breast cancer (BRCA) gene test is recommended for women who have family members with BRCA-related cancers. Results of the assessment will determine the need for genetic counseling and BRCA1 and for BRCA2 testing. BRCA-related cancers include these types:  Breast. This occurs in males or females.  Ovarian.  Tubal. This may also be called fallopian tube cancer.  Cancer of the abdominal or pelvic lining (peritoneal cancer).  Prostate.  Pancreatic. Cervical, Uterine, and Ovarian Cancer  Your health care provider may recommend that you be screened regularly for cancer of the pelvic organs. These include your ovaries, uterus, and vagina. This screening involves a pelvic exam, which includes checking for microscopic changes to the surface of your cervix (Pap test).  For women ages 21-65, health care providers may recommend a pelvic exam and a Pap test every three years. For women ages 69-65, they may recommend the Pap test and pelvic exam, combined with testing for human papilloma virus (HPV), every five years. Some types of HPV increase your risk of cervical cancer. Testing for HPV may also be done on women of any age who have unclear Pap test results.  Other health care providers may not recommend any screening for nonpregnant women who are considered low risk for pelvic cancer and have no symptoms. Ask your health care provider if a  screening pelvic exam is right for you.  If you have had past treatment for cervical cancer or a condition that could lead to cancer, you need Pap tests and screening for cancer for at least 20 years after your treatment. If Pap tests have been discontinued for you, your risk factors (such as having a new sexual partner) need to be reassessed to determine if you should start having screenings again. Some women have medical problems that increase the chance of getting cervical cancer. In these cases, your health care provider may recommend that you have screening and Pap tests more often.  If you have a family history of uterine cancer or ovarian cancer, talk with your health care provider about genetic screening.  If you have vaginal bleeding after reaching menopause, tell your health care provider.  There are currently no reliable tests available to screen for ovarian cancer. Lung Cancer  Lung cancer screening is recommended for adults 69-42 years old who are at high risk for lung cancer because of a history of smoking. A yearly low-dose CT scan of the lungs is recommended if you:  Currently smoke.  Have a history of at least 30 pack-years of smoking and you currently smoke or have quit within the past 15 years. A pack-year is smoking an average of one pack of cigarettes  per day for one year. Yearly screening should:  Continue until it has been 15 years since you quit.  Stop if you develop a health problem that would prevent you from having lung cancer treatment. Colorectal Cancer  This type of cancer can be detected and can often be prevented.  Routine colorectal cancer screening usually begins at age 20 and continues through age 68.  If you have risk factors for colon cancer, your health care provider may recommend that you be screened at an earlier age.  If you have a family history of colorectal cancer, talk with your health care provider about genetic screening.  Your health care  provider may also recommend using home test kits to check for hidden blood in your stool.  A small camera at the end of a tube can be used to examine your colon directly (sigmoidoscopy or colonoscopy). This is done to check for the earliest forms of colorectal cancer.  Direct examination of the colon should be repeated every 5-10 years until age 1. However, if early forms of precancerous polyps or small growths are found or if you have a family history or genetic risk for colorectal cancer, you may need to be screened more often. Skin Cancer  Check your skin from head to toe regularly.  Monitor any moles. Be sure to tell your health care provider:  About any new moles or changes in moles, especially if there is a change in a mole's shape or color.  If you have a mole that is larger than the size of a pencil eraser.  If any of your family members has a history of skin cancer, especially at a young age, talk with your health care provider about genetic screening.  Always use sunscreen. Apply sunscreen liberally and repeatedly throughout the day.  Whenever you are outside, protect yourself by wearing long sleeves, pants, a wide-brimmed hat, and sunglasses. What should I know about osteoporosis? Osteoporosis is a condition in which bone destruction happens more quickly than new bone creation. After menopause, you may be at an increased risk for osteoporosis. To help prevent osteoporosis or the bone fractures that can happen because of osteoporosis, the following is recommended:  If you are 32-72 years old, get at least 1,000 mg of calcium and at least 600 mg of vitamin D per day.  If you are older than age 62 but younger than age 47, get at least 1,200 mg of calcium and at least 600 mg of vitamin D per day.  If you are older than age 38, get at least 1,200 mg of calcium and at least 800 mg of vitamin D per day. Smoking and excessive alcohol intake increase the risk of osteoporosis. Eat foods  that are rich in calcium and vitamin D, and do weight-bearing exercises several times each week as directed by your health care provider. What should I know about how menopause affects my mental health? Depression may occur at any age, but it is more common as you become older. Common symptoms of depression include:  Low or sad mood.  Changes in sleep patterns.  Changes in appetite or eating patterns.  Feeling an overall lack of motivation or enjoyment of activities that you previously enjoyed.  Frequent crying spells. Talk with your health care provider if you think that you are experiencing depression. What should I know about immunizations? It is important that you get and maintain your immunizations. These include:  Tetanus, diphtheria, and pertussis (Tdap) booster vaccine.  Influenza  every year before the flu season begins.  Pneumonia vaccine.  Shingles vaccine. Your health care provider may also recommend other immunizations. This information is not intended to replace advice given to you by your health care provider. Make sure you discuss any questions you have with your health care provider. Document Released: 02/25/2005 Document Revised: 07/24/2015 Document Reviewed: 10/07/2014  2017 Elsevier

## 2015-12-08 NOTE — Progress Notes (Signed)
Pre visit review using our clinic review tool, if applicable. No additional management support is needed unless otherwise documented below in the visit note. 

## 2015-12-15 ENCOUNTER — Telehealth: Payer: Self-pay | Admitting: *Deleted

## 2015-12-15 ENCOUNTER — Encounter: Payer: Medicare Other | Admitting: Family

## 2015-12-15 NOTE — Telephone Encounter (Signed)
Received referral for low dose lung cancer screening CT scan. Voicemail left at phone number listed in EMR for patient to call me back to facilitate scheduling scan.  

## 2015-12-16 ENCOUNTER — Ambulatory Visit: Payer: Medicare Other

## 2015-12-22 ENCOUNTER — Telehealth: Payer: Self-pay | Admitting: *Deleted

## 2015-12-22 NOTE — Telephone Encounter (Signed)
Received referral for low dose lung cancer screening CT scan. Voicemail left at phone number listed in EMR for patient to call me back to facilitate scheduling scan. Letter will be mailed if call not returned.  

## 2015-12-23 ENCOUNTER — Ambulatory Visit
Admission: RE | Admit: 2015-12-23 | Discharge: 2015-12-23 | Disposition: A | Discharge status: Home / Self Care | Payer: Medicare Other | Source: Ambulatory Visit | Attending: Family | Admitting: Family

## 2015-12-23 DIAGNOSIS — R740 Nonspecific elevation of levels of transaminase and lactic acid dehydrogenase [LDH]: Secondary | ICD-10-CM | POA: Insufficient documentation

## 2015-12-23 DIAGNOSIS — K760 Fatty (change of) liver, not elsewhere classified: Secondary | ICD-10-CM | POA: Insufficient documentation

## 2015-12-23 DIAGNOSIS — R7989 Other specified abnormal findings of blood chemistry: Secondary | ICD-10-CM | POA: Diagnosis not present

## 2015-12-23 DIAGNOSIS — R7401 Elevation of levels of liver transaminase levels: Secondary | ICD-10-CM

## 2015-12-24 ENCOUNTER — Encounter: Payer: Self-pay | Admitting: Family

## 2015-12-24 ENCOUNTER — Ambulatory Visit (INDEPENDENT_AMBULATORY_CARE_PROVIDER_SITE_OTHER): Payer: Medicare Other | Admitting: Family

## 2015-12-24 VITALS — BP 132/62 | HR 83 | Temp 98.3°F | Ht 62.0 in | Wt 182.8 lb

## 2015-12-24 DIAGNOSIS — I10 Essential (primary) hypertension: Secondary | ICD-10-CM | POA: Diagnosis not present

## 2015-12-24 DIAGNOSIS — M1990 Unspecified osteoarthritis, unspecified site: Secondary | ICD-10-CM

## 2015-12-24 DIAGNOSIS — R748 Abnormal levels of other serum enzymes: Secondary | ICD-10-CM | POA: Diagnosis not present

## 2015-12-24 MED ORDER — VALSARTAN-HYDROCHLOROTHIAZIDE 320-12.5 MG PO TABS: 1.0000 | ORAL_TABLET | Freq: Every day | ORAL | 1 refills | Status: DC

## 2015-12-24 MED ORDER — DICLOFENAC SODIUM 1 % TD GEL: 2.0000 g | Freq: Four times a day (QID) | TRANSDERMAL | 3 refills | Status: DC

## 2015-12-24 NOTE — Assessment & Plan Note (Addendum)
elevated at home. Increased valsartan. Patient will keep blood pressure log and give [name] a call if not at goal. F/u 3 months.

## 2015-12-24 NOTE — Assessment & Plan Note (Addendum)
New.Symptoms consistent with arthritis. Patient will trial topical Voltaren gel.

## 2015-12-24 NOTE — Patient Instructions (Signed)
As  Discussed, please speak with Dr Darnell Level about your elevated AST, ALT and fatty liver disease.   Please monitor your BP and let me know if > 140/90.  Happy holidays!

## 2015-12-24 NOTE — Progress Notes (Signed)
Subjective:    Patient ID: Jennifer [name], female    DOB: 1950/09/10, 65 y.o.   MRN: [id number]  CC: Jennifer [name] is a 65 y.o. female who presents today for follow up.   HPI:  Elevated liver enzymes- Sees Dr Darnell Level in 2 weeks.   HTN- BP at home had been elevated, 160/90. "had slight HA in forehead', resolved. Denies exertional chest pain or pressure, numbness or tingling radiating to left arm or jaw, palpitations, dizziness, frequent headaches, changes in vision, or shortness of breath. Took another valsartan which was the dose she had been on in the past.   Complains of bilateral shoulder pain, for couple of months. Hurts to lay on shoulders when lying in bed. Has been trying fresh ginger, tyelonol, with minimal relief.       HISTORY:  Past Medical History:  Diagnosis Date  . High cholesterol   . Hypertension   . Hypothyroidism    Past Surgical History:  Procedure Laterality Date  . ABDOMINAL HYSTERECTOMY    . bariatric sleeve  2015  . BREAST EXCISIONAL BIOPSY Left 1998  . carpal tunnel repair    . HEMORRHOID SURGERY     Family History  Problem Relation Age of Onset  . Breast cancer Sister 26    materal 1/2 sister    Allergies: Nasacort [triamcinolone] and Vicodin [hydrocodone-acetaminophen] Current Outpatient Prescriptions on File Prior to Visit  Medication Sig Dispense Refill  . acetaminophen (TYLENOL) 500 MG tablet Take 1,000 mg by mouth every 6 (six) hours as needed.     [name] Kitchen acyclovir (ZOVIRAX) 400 MG tablet Take 1 tablet (400 mg total) by mouth daily. 90 tablet 1  . amLODipine (NORVASC) 5 MG tablet Take 1 tablet (5 mg total) by mouth daily. 90 tablet 1  . aspirin 81 MG tablet Take 81 mg by mouth daily.    . Biotin 5000 MCG CAPS Take 5,000 mcg by mouth daily.     . cholecalciferol (VITAMIN D) 1000 UNITS tablet Take 400 Units by mouth daily.     [name] Kitchen levothyroxine (SYNTHROID, LEVOTHROID) 25 MCG tablet Take 25 mcg by mouth daily before breakfast.    .  Lifitegrast (XIIDRA) 5 % SOLN Apply 1 drop to eye daily as needed.     . Multiple Vitamins-Minerals (MULTIVITAMIN WITH MINERALS) tablet Take 1 tablet by mouth daily.    [name] Kitchen omeprazole (PRILOSEC) 20 MG capsule Take 20 mg by mouth daily.    . pravastatin (PRAVACHOL) 40 MG tablet Take 40 mg by mouth daily.    . sodium chloride (OCEAN) 0.65 % SOLN nasal spray Place 1 spray into both nostrils as needed for congestion.    . traZODone (DESYREL) 50 MG tablet Take 1 tablet (50 mg total) by mouth at bedtime. 90 tablet 1   No current facility-administered medications on file prior to visit.     Social History  Substance Use Topics  . Smoking status: Former Research scientist (life sciences)  . Smokeless tobacco: Never Used  . Alcohol use 8.4 oz/week    14 Glasses of wine per week    Review of Systems  Constitutional: Negative for chills and fever.  Respiratory: Negative for cough.   Cardiovascular: Negative for chest pain and palpitations.  Gastrointestinal: Negative for nausea and vomiting.  Musculoskeletal: Positive for back pain (chronic).      Objective:    BP 132/62   Pulse 83   Temp 98.3 F (36.8 C) (Oral)   Ht 5\' 2"  (1.575 m)   Wt 182 lb  12.8 oz (82.9 kg)   SpO2 96%   BMI 33.43 kg/m  BP Readings from Last 3 Encounters:  12/24/15 132/62  12/08/15 (!) 160/80  12/07/15 (!) 156/82   Wt Readings from Last 3 Encounters:  12/24/15 182 lb 12.8 oz (82.9 kg)  12/08/15 181 lb 3.2 oz (82.2 kg)  12/07/15 183 lb (83 kg)    Physical Exam  Constitutional: She appears well-developed and well-nourished.  Eyes: Conjunctivae are normal.  Cardiovascular: Normal rate, regular rhythm, normal heart sounds and normal pulses.   Pulmonary/Chest: Effort normal and breath sounds normal. She has no wheezes. She has no rhonchi. She has no rales.  Musculoskeletal:       Right shoulder: She exhibits tenderness. She exhibits normal range of motion, no bony tenderness, no swelling, no pain, no spasm and normal strength.        Left shoulder: She exhibits tenderness. She exhibits normal range of motion, no bony tenderness, no pain, no spasm and normal strength.   No asymmetry of shoulders when comparing right and left. No pain with palpation over glenohumeral joint lines.   Pain over bilateral AC joint.   No pain with internal and external rotation. No pain with resisted lateral extension .   Negative active painful arc sign. Negative passive arc ( Neer's). Negative drop arm. No pain, swelling, or ecchymosis noted over long head of biceps.   Strength and sensation normal BUE's.   Neurological: She is alert.  Skin: Skin is warm and dry.  Psychiatric: She has a normal mood and affect. Her speech is normal and behavior is normal. Thought content normal.  Vitals reviewed.      Assessment & Plan:   Problem List Items Addressed This Visit      Cardiovascular and Mediastinum   Essential hypertension    elevated at home. Increased valsartan. Patient will keep blood pressure log and give [name] a call if not at goal. F/u 3 months.         Musculoskeletal and Integument   Arthritis - Primary    New.Symptoms consistent with arthritis. Patient will trial topical Voltaren gel.      Relevant Medications   diclofenac sodium (VOLTAREN) 1 % GEL     Other   Elevated liver enzymes    Patient will see her gastroenterologist, Dr Darnell Level, in 2 weeks we jointly agreed that she would discuss with him persistently elevated ALT, AST, especially in the context of her bariatric sleeve surgery.          I have discontinued Ms. Peale's valsartan-hydrochlorothiazide. I am also having her start on diclofenac sodium. Additionally, I am having her maintain her pravastatin, levothyroxine, Biotin, aspirin, cholecalciferol, omeprazole, Lifitegrast, acetaminophen, multivitamin with minerals, sodium chloride, amLODipine, acyclovir, and traZODone.   Meds ordered this encounter  Medications  . diclofenac sodium (VOLTAREN) 1 % GEL     Sig: Apply 2 g topically 4 (four) times daily.    Dispense:  100 g    Refill:  3    Order Specific Question:   Supervising Provider    Answer:   Crecencio Mc [[id number]]    Return precautions given.   Risks, benefits, and alternatives of the medications and treatment plan prescribed today were discussed, and patient expressed understanding.   Education regarding symptom management and diagnosis given to patient on AVS.  Continue to follow with [name], FNP for routine health maintenance.   Jennifer [name] and I agreed with plan.   [name], FNP

## 2015-12-24 NOTE — Assessment & Plan Note (Signed)
Patient will see her gastroenterologist, Dr Darnell Level, in 2 weeks we jointly agreed that she would discuss with him persistently elevated ALT, AST, especially in the context of her bariatric sleeve surgery.

## 2015-12-24 NOTE — Progress Notes (Signed)
Pre visit review using our clinic review tool, if applicable. No additional management support is needed unless otherwise documented below in the visit note. 

## 2015-12-25 NOTE — Progress Notes (Signed)
Left detailed message informing patient that she needs to call and schedule lab appointment for next week to check potassium and sodium.

## 2015-12-28 ENCOUNTER — Telehealth: Payer: Self-pay | Admitting: *Deleted

## 2015-12-28 NOTE — Telephone Encounter (Signed)
Received referral for initial lung cancer screening scan. Patient returned my call and reports smoking cessation 25 years ago. Given this history, she is not a candidate for screening CT scan. Reinforcement of positive benefit of smoking cessation is given to patient. And while chance of lung cancer is much reduced, patient is encouraged to notify PCP for any s/s in the future.

## 2015-12-30 ENCOUNTER — Other Ambulatory Visit (INDEPENDENT_AMBULATORY_CARE_PROVIDER_SITE_OTHER): Payer: Medicare Other

## 2015-12-30 DIAGNOSIS — I10 Essential (primary) hypertension: Secondary | ICD-10-CM

## 2015-12-30 LAB — COMPREHENSIVE METABOLIC PANEL
ALT: 42 U/L — ABNORMAL HIGH (ref 0–35)
AST: 37 U/L (ref 0–37)
Albumin: 4.9 g/dL (ref 3.5–5.2)
Alkaline Phosphatase: 63 U/L (ref 39–117)
BUN: 19 mg/dL (ref 6–23)
CO2: 32 mEq/L (ref 19–32)
Calcium: 9.9 mg/dL (ref 8.4–10.5)
Chloride: 100 mEq/L (ref 96–112)
Creatinine, Ser: 0.73 mg/dL (ref 0.40–1.20)
GFR: 102.46 mL/min (ref >60.00)
Glucose, Bld: 121 mg/dL — ABNORMAL HIGH (ref 70–99)
Potassium: 4.1 mEq/L (ref 3.5–5.1)
Sodium: 139 mEq/L (ref 135–145)
Total Bilirubin: 0.6 mg/dL (ref 0.2–1.2)
Total Protein: 7.6 g/dL (ref 6.0–8.3)

## 2015-12-31 DIAGNOSIS — M545 Low back pain: Secondary | ICD-10-CM | POA: Diagnosis not present

## 2015-12-31 DIAGNOSIS — M542 Cervicalgia: Secondary | ICD-10-CM | POA: Diagnosis not present

## 2015-12-31 DIAGNOSIS — M5416 Radiculopathy, lumbar region: Secondary | ICD-10-CM | POA: Diagnosis not present

## 2016-01-03 ENCOUNTER — Encounter: Payer: Self-pay | Admitting: Family

## 2016-01-04 ENCOUNTER — Telehealth: Payer: Self-pay

## 2016-01-04 NOTE — Telephone Encounter (Signed)
Spoke with Patient at this time and scheduled Colonoscopy 02/11/16 and suprep has been sent to pharmacy.

## 2016-01-05 ENCOUNTER — Other Ambulatory Visit: Payer: Self-pay

## 2016-01-05 NOTE — Telephone Encounter (Signed)
Gastroenterology Pre-Procedure Review  Request Date:  Requesting Physician: Dr.   PATIENT REVIEW QUESTIONS: The patient responded to the following health history questions as indicated:    1. Are you having any GI issues? Constipation, rectal bleeding, hemorrhoids 2. Do you have a personal history of Polyps? yes (Hx of colon polyps) 3. Do you have a family history of Colon Cancer or Polyps? no 4. Diabetes Mellitus? no 5. Joint replacements in the past 12 months?no 6. Major health problems in the past 3 months?no 7. Any artificial heart valves, MVP, or defibrillator?no    MEDICATIONS & ALLERGIES:    Patient reports the following regarding taking any anticoagulation/antiplatelet therapy:   Plavix, Coumadin, Eliquis, Xarelto, Lovenox, Pradaxa, Brilinta, or Effient? no Aspirin? yes (ASA 81mg )  Patient confirms/reports the following medications:  Current Outpatient Prescriptions  Medication Sig Dispense Refill  . acetaminophen (TYLENOL) 500 MG tablet Take 1,000 mg by mouth every 6 (six) hours as needed.     [name] Kitchen acyclovir (ZOVIRAX) 400 MG tablet Take 1 tablet (400 mg total) by mouth daily. 90 tablet 1  . amLODipine (NORVASC) 5 MG tablet Take 1 tablet (5 mg total) by mouth daily. 90 tablet 1  . aspirin 81 MG tablet Take 81 mg by mouth daily.    . Biotin 5000 MCG CAPS Take 5,000 mcg by mouth daily.     . cholecalciferol (VITAMIN D) 1000 UNITS tablet Take 400 Units by mouth daily.     . diclofenac sodium (VOLTAREN) 1 % GEL Apply 2 g topically 4 (four) times daily. 100 g 3  . levothyroxine (SYNTHROID, LEVOTHROID) 25 MCG tablet Take 25 mcg by mouth daily before breakfast.    . Lifitegrast (XIIDRA) 5 % SOLN Apply 1 drop to eye daily as needed.     . Multiple Vitamins-Minerals (MULTIVITAMIN WITH MINERALS) tablet Take 1 tablet by mouth daily.    [name] Kitchen omeprazole (PRILOSEC) 20 MG capsule Take 20 mg by mouth daily.    . pravastatin (PRAVACHOL) 40 MG tablet Take 40 mg by mouth daily.    . sodium chloride  (OCEAN) 0.65 % SOLN nasal spray Place 1 spray into both nostrils as needed for congestion.    . traZODone (DESYREL) 50 MG tablet Take 1 tablet (50 mg total) by mouth at bedtime. 90 tablet 1  . valsartan-hydrochlorothiazide (DIOVAN-HCT) 320-12.5 MG tablet Take 1 tablet by mouth daily. 90 tablet 1   No current facility-administered medications for this visit.     Patient confirms/reports the following allergies:  Allergies  Allergen Reactions  . Nasacort [Triamcinolone] Other (See Comments)    Nasal - Nose Bleeds  . Vicodin [Hydrocodone-Acetaminophen] Nausea Only    No orders of the defined types were placed in this encounter.   AUTHORIZATION INFORMATION Primary Insurance: 1D#: Group #:  Secondary Insurance: 1D#: Group #:  SCHEDULE INFORMATION: Date: 02/11/16 Time: Location: [place]

## 2016-01-13 DIAGNOSIS — G8929 Other chronic pain: Secondary | ICD-10-CM | POA: Diagnosis not present

## 2016-01-13 DIAGNOSIS — M5136 Other intervertebral disc degeneration, lumbar region: Secondary | ICD-10-CM | POA: Diagnosis not present

## 2016-01-13 DIAGNOSIS — M5416 Radiculopathy, lumbar region: Secondary | ICD-10-CM | POA: Diagnosis not present

## 2016-01-13 DIAGNOSIS — R748 Abnormal levels of other serum enzymes: Secondary | ICD-10-CM | POA: Diagnosis not present

## 2016-01-13 DIAGNOSIS — M25512 Pain in left shoulder: Secondary | ICD-10-CM | POA: Insufficient documentation

## 2016-01-13 DIAGNOSIS — M25511 Pain in right shoulder: Secondary | ICD-10-CM | POA: Insufficient documentation

## 2016-01-14 ENCOUNTER — Telehealth: Payer: Self-pay

## 2016-01-14 ENCOUNTER — Other Ambulatory Visit: Payer: Self-pay | Admitting: Internal Medicine

## 2016-01-14 ENCOUNTER — Other Ambulatory Visit: Payer: Self-pay | Admitting: Radiology

## 2016-01-14 DIAGNOSIS — M5136 Other intervertebral disc degeneration, lumbar region: Secondary | ICD-10-CM

## 2016-01-14 DIAGNOSIS — Z0000 Encounter for general adult medical examination without abnormal findings: Secondary | ICD-10-CM

## 2016-01-14 MED ORDER — LEVOTHYROXINE SODIUM 25 MCG PO TABS: 25.0000 ug | ORAL_TABLET | Freq: Every day | ORAL | 3 refills | Status: DC

## 2016-01-14 MED ORDER — TRAZODONE HCL 50 MG PO TABS: 50.0000 mg | ORAL_TABLET | Freq: Every day | ORAL | 1 refills | Status: DC

## 2016-01-14 NOTE — Telephone Encounter (Signed)
levothyroxine (SYNTHROID, LEVOTHROID) 25 MCG tablet  traZODone (DESYREL) 50 MG tablet [id number]   Patient would like to do 90 day supply through [facility]  Spoke with patient has had 90 day supply sent to [facility] 12/08/2015  Please advise and make adjustments

## 2016-01-14 NOTE — Assessment & Plan Note (Signed)
TGs very high; would like patient to see Valera Castle D for consult, management.

## 2016-01-15 MED ORDER — LEVOTHYROXINE SODIUM 25 MCG PO TABS: 25.0000 ug | ORAL_TABLET | Freq: Every day | ORAL | 3 refills | Status: DC

## 2016-01-15 MED ORDER — TRAZODONE HCL 50 MG PO TABS: 50.0000 mg | ORAL_TABLET | Freq: Every day | ORAL | 1 refills | Status: DC

## 2016-01-15 NOTE — Addendum Note (Signed)
Addended by: Elpidio Galea T on: 01/15/2016 11:24 AM   Modules accepted: Orders

## 2016-01-15 NOTE — Telephone Encounter (Signed)
Medication has been sent to the correct pharmacy.

## 2016-01-21 ENCOUNTER — Other Ambulatory Visit: Payer: Self-pay

## 2016-01-21 DIAGNOSIS — I10 Essential (primary) hypertension: Secondary | ICD-10-CM

## 2016-01-21 MED ORDER — AMLODIPINE BESYLATE 5 MG PO TABS: 5.0000 mg | ORAL_TABLET | Freq: Every day | ORAL | 1 refills | Status: DC

## 2016-01-21 MED ORDER — VALSARTAN-HYDROCHLOROTHIAZIDE 320-12.5 MG PO TABS: 1.0000 | ORAL_TABLET | Freq: Every day | ORAL | 1 refills | Status: DC

## 2016-01-21 NOTE — Telephone Encounter (Signed)
Medication has been sent to correct pharmacy. 

## 2016-01-24 DIAGNOSIS — J069 Acute upper respiratory infection, unspecified: Secondary | ICD-10-CM | POA: Diagnosis not present

## 2016-01-25 ENCOUNTER — Ambulatory Visit: Payer: Medicare Other

## 2016-01-26 ENCOUNTER — Telehealth: Payer: Self-pay | Admitting: *Deleted

## 2016-01-26 NOTE — Telephone Encounter (Signed)
Reason for call: Symptoms: chills, fever 100.6, congestion, cough, sore throat, achy, no taste or smell Duration Friday Medications:Prednisone, benzonatate, Night Cold Flu Relief  Last seen for this problem: [facility] tested negative for flu Please advise.

## 2016-01-26 NOTE — Telephone Encounter (Signed)
Pt was seen at [facility] on Sunday, pt was running a low grade fever,congestion and cough. Pt currently has the same symptoms after being prescribed benzonatate and prednisone. Pt requested a call to discuss other options or medications for care. Pt contact [phone]

## 2016-01-27 ENCOUNTER — Telehealth: Payer: Self-pay | Admitting: Family

## 2016-01-27 NOTE — Telephone Encounter (Signed)
Called pt  'feeling better today.' No fever  Offered appt and declined for now. Will call if symptoms worsen.

## 2016-02-03 ENCOUNTER — Ambulatory Visit: Admission: RE | Admit: 2016-02-03 | Payer: Medicare Other | Source: Ambulatory Visit

## 2016-02-10 ENCOUNTER — Encounter: Payer: Self-pay | Admitting: *Deleted

## 2016-02-10 ENCOUNTER — Ambulatory Visit
Admission: RE | Admit: 2016-02-10 | Discharge: 2016-02-10 | Disposition: A | Discharge status: Home / Self Care | Payer: Medicare Other | Source: Ambulatory Visit | Attending: Internal Medicine | Admitting: Internal Medicine

## 2016-02-10 DIAGNOSIS — M545 Low back pain: Secondary | ICD-10-CM | POA: Diagnosis not present

## 2016-02-10 DIAGNOSIS — M5136 Other intervertebral disc degeneration, lumbar region: Secondary | ICD-10-CM | POA: Diagnosis not present

## 2016-02-11 ENCOUNTER — Ambulatory Visit: Payer: Medicare Other | Admitting: Anesthesiology

## 2016-02-11 ENCOUNTER — Encounter
Admission: RE | Disposition: A | Discharge status: Home / Self Care | Payer: Self-pay | Source: Ambulatory Visit | Attending: Gastroenterology

## 2016-02-11 ENCOUNTER — Encounter: Payer: Self-pay | Admitting: *Deleted

## 2016-02-11 ENCOUNTER — Ambulatory Visit
Admission: RE | Admit: 2016-02-11 | Discharge: 2016-02-11 | Disposition: A | Discharge status: Home / Self Care | Payer: Medicare Other | Source: Ambulatory Visit | Attending: Gastroenterology | Admitting: Gastroenterology

## 2016-02-11 DIAGNOSIS — K649 Unspecified hemorrhoids: Secondary | ICD-10-CM | POA: Diagnosis not present

## 2016-02-11 DIAGNOSIS — D122 Benign neoplasm of ascending colon: Secondary | ICD-10-CM | POA: Diagnosis not present

## 2016-02-11 DIAGNOSIS — Z86010 Personal history of colonic polyps: Secondary | ICD-10-CM | POA: Diagnosis not present

## 2016-02-11 DIAGNOSIS — I10 Essential (primary) hypertension: Secondary | ICD-10-CM | POA: Insufficient documentation

## 2016-02-11 DIAGNOSIS — M1990 Unspecified osteoarthritis, unspecified site: Secondary | ICD-10-CM | POA: Insufficient documentation

## 2016-02-11 DIAGNOSIS — K219 Gastro-esophageal reflux disease without esophagitis: Secondary | ICD-10-CM | POA: Diagnosis not present

## 2016-02-11 DIAGNOSIS — Z803 Family history of malignant neoplasm of breast: Secondary | ICD-10-CM | POA: Insufficient documentation

## 2016-02-11 DIAGNOSIS — Z1211 Encounter for screening for malignant neoplasm of colon: Secondary | ICD-10-CM | POA: Insufficient documentation

## 2016-02-11 DIAGNOSIS — K640 First degree hemorrhoids: Secondary | ICD-10-CM | POA: Diagnosis not present

## 2016-02-11 DIAGNOSIS — E039 Hypothyroidism, unspecified: Secondary | ICD-10-CM | POA: Insufficient documentation

## 2016-02-11 DIAGNOSIS — Z7982 Long term (current) use of aspirin: Secondary | ICD-10-CM | POA: Insufficient documentation

## 2016-02-11 DIAGNOSIS — Z79899 Other long term (current) drug therapy: Secondary | ICD-10-CM | POA: Insufficient documentation

## 2016-02-11 DIAGNOSIS — E7800 Pure hypercholesterolemia, unspecified: Secondary | ICD-10-CM | POA: Diagnosis not present

## 2016-02-11 DIAGNOSIS — D124 Benign neoplasm of descending colon: Secondary | ICD-10-CM | POA: Insufficient documentation

## 2016-02-11 DIAGNOSIS — Z87891 Personal history of nicotine dependence: Secondary | ICD-10-CM | POA: Diagnosis not present

## 2016-02-11 DIAGNOSIS — D120 Benign neoplasm of cecum: Secondary | ICD-10-CM | POA: Insufficient documentation

## 2016-02-11 DIAGNOSIS — K635 Polyp of colon: Secondary | ICD-10-CM | POA: Diagnosis not present

## 2016-02-11 HISTORY — PX: COLONOSCOPY WITH PROPOFOL: SHX5780

## 2016-02-11 SURGERY — COLONOSCOPY WITH PROPOFOL
Anesthesia: General

## 2016-02-11 MED ORDER — FENTANYL CITRATE (PF) 100 MCG/2ML IJ SOLN
INTRAMUSCULAR | Status: DC | PRN
  Administered 2016-02-11: 50 ug via INTRAVENOUS
  Administered 2016-02-11: 25 ug via INTRAVENOUS

## 2016-02-11 MED ORDER — EPHEDRINE SULFATE 50 MG/ML IJ SOLN
INTRAMUSCULAR | Status: DC | PRN
  Administered 2016-02-11 (×3): 5 mg via INTRAVENOUS

## 2016-02-11 MED ORDER — EPHEDRINE 5 MG/ML INJ
INTRAVENOUS | Status: AC
  Filled 2016-02-11: qty 10

## 2016-02-11 MED ORDER — SODIUM CHLORIDE 0.9 % IV SOLN
INTRAVENOUS | Status: DC
  Administered 2016-02-11: 08:00:00 via INTRAVENOUS

## 2016-02-11 MED ORDER — IPRATROPIUM-ALBUTEROL 0.5-2.5 (3) MG/3ML IN SOLN
RESPIRATORY_TRACT | Status: AC
  Filled 2016-02-11: qty 3

## 2016-02-11 MED ORDER — MIDAZOLAM HCL 2 MG/2ML IJ SOLN
INTRAMUSCULAR | Status: AC
  Filled 2016-02-11: qty 2

## 2016-02-11 MED ORDER — PROPOFOL 500 MG/50ML IV EMUL
INTRAVENOUS | Status: AC
  Filled 2016-02-11: qty 50

## 2016-02-11 MED ORDER — MIDAZOLAM HCL 2 MG/2ML IJ SOLN
INTRAMUSCULAR | Status: DC | PRN
  Administered 2016-02-11 (×2): 1 mg via INTRAVENOUS

## 2016-02-11 MED ORDER — PROPOFOL 500 MG/50ML IV EMUL
INTRAVENOUS | Status: DC | PRN
  Administered 2016-02-11: 120 ug/kg/min via INTRAVENOUS

## 2016-02-11 MED ORDER — FENTANYL CITRATE (PF) 100 MCG/2ML IJ SOLN
INTRAMUSCULAR | Status: AC
  Filled 2016-02-11: qty 2

## 2016-02-11 NOTE — Anesthesia Preprocedure Evaluation (Signed)
Anesthesia Evaluation  Patient identified by MRN, date of birth, ID band Patient awake    Reviewed: Allergy & Precautions, H&P , NPO status , Patient's Chart, lab work & pertinent test results  History of Anesthesia Complications Negative for: history of anesthetic complications  Airway Mallampati: III  TM Distance: >3 FB Neck ROM: limited    Dental  (+) Poor Dentition, Chipped   Pulmonary neg shortness of breath, former smoker,    Pulmonary exam normal breath sounds clear to auscultation       Cardiovascular Exercise Tolerance: Good hypertension, (-) angina(-) CAD and (-) DOE Normal cardiovascular exam Rhythm:regular Rate:Normal     Neuro/Psych negative neurological ROS  negative psych ROS   GI/Hepatic Neg liver ROS, GERD  Controlled,  Endo/Other  Hypothyroidism   Renal/GU negative Renal ROS  negative genitourinary   Musculoskeletal  (+) Arthritis ,   Abdominal   Peds  Hematology negative hematology ROS (+)   Anesthesia Other Findings Signs and symptoms suggestive of sleep apnea   Past Medical History: No date: High cholesterol No date: Hypertension No date: Hypothyroidism  Past Surgical History: No date: ABDOMINAL HYSTERECTOMY 2015: bariatric sleeve 1998: BREAST EXCISIONAL BIOPSY Left No date: carpal tunnel repair No date: HEMORRHOID SURGERY  BMI    Body Mass Index:  32.56 kg/m      Reproductive/Obstetrics negative OB ROS                             Anesthesia Physical Anesthesia Plan  ASA: III  Anesthesia Plan: General   Post-op Pain Management:    Induction:   Airway Management Planned:   Additional Equipment:   Intra-op Plan:   Post-operative Plan:   Informed Consent: I have reviewed the patients History and Physical, chart, labs and discussed the procedure including the risks, benefits and alternatives for the proposed anesthesia with the patient or  authorized representative who has indicated his/her understanding and acceptance.   Dental Advisory Given  Plan Discussed with: Anesthesiologist, CRNA and Surgeon  Anesthesia Plan Comments:         Anesthesia Quick Evaluation

## 2016-02-11 NOTE — Op Note (Signed)
[facility] Gastroenterology Patient Name: Jennifer [name] Procedure Date: 02/11/2016 8:36 AM MRN: [id number] Account #: [id number] Date of Birth: 1950/04/01 Admit Type: Outpatient Age: 66 Room: [facility] ENDO ROOM 4 Gender: Female Note Status: Finalized Procedure:            Colonoscopy Indications:          High risk colon cancer surveillance: Personal history                        of colonic polyps Providers:            Jonathon Bellows MD, MD Referring MD:         Yvetta Coder. Arnett (Referring MD) Medicines:            Monitored Anesthesia Care Complications:        No immediate complications. Procedure:            Pre-Anesthesia Assessment:                       - Prior to the procedure, a History and Physical was                        performed, and patient medications, allergies and                        sensitivities were reviewed. The patient's tolerance of                        previous anesthesia was reviewed.                       - The risks and benefits of the procedure and the                        sedation options and risks were discussed with the                        patient. All questions were answered and informed                        consent was obtained.                       - The risks and benefits of the procedure and the                        sedation options and risks were discussed with the                        patient. All questions were answered and informed                        consent was obtained.                       - ASA Grade Assessment: III - A patient with severe                        systemic disease.  After obtaining informed consent, the colonoscope was                        passed under direct vision. Throughout the procedure,                        the patient's blood pressure, pulse, and oxygen                        saturations were monitored continuously. The   Colonoscope was introduced through the anus and                        advanced to the the cecum, identified by the                        appendiceal orifice, IC valve and transillumination.                        The colonoscopy was performed without difficulty. The                        patient tolerated the procedure well. The quality of                        the bowel preparation was excellent. Findings:      Non-bleeding internal hemorrhoids were found during retroflexion. The       hemorrhoids were medium-sized and Grade I (internal hemorrhoids that do       not prolapse).      A 3 mm polyp was found in the cecum. The polyp was sessile. The polyp       was removed with a cold biopsy forceps. Resection and retrieval were       complete.      A 5 mm polyp was found in the ascending colon. The polyp was sessile.       The polyp was removed with a cold biopsy forceps. Resection and       retrieval were complete.      A 5 mm polyp was found in the descending colon. The polyp was sessile.       The polyp was removed with a cold biopsy forceps. Resection and       retrieval were complete. Impression:           - Non-bleeding internal hemorrhoids.                       - One 3 mm polyp in the cecum, removed with a cold                        biopsy forceps. Resected and retrieved.                       - One 5 mm polyp in the ascending colon, removed with a                        cold biopsy forceps. Resected and retrieved.                       - One 5 mm polyp in the descending colon, removed with  a cold biopsy forceps. Resected and retrieved. Recommendation:       - Discharge patient to home (with escort).                       - Resume previous diet.                       - Continue present medications.                       - Await pathology results.                       - Repeat colonoscopy in 3 - 5 years for surveillance                        based on  pathology results. Procedure Code(s):    --- Professional ---                       [phone], Colonoscopy, flexible; with biopsy, single or                        multiple Diagnosis Code(s):    --- Professional ---                       Z86.010, Personal history of colonic polyps                       D12.0, Benign neoplasm of cecum                       D12.2, Benign neoplasm of ascending colon                       D12.4, Benign neoplasm of descending colon                       K64.0, First degree hemorrhoids CPT copyright 2016 American Medical Association. All rights reserved. The codes documented in this report are preliminary and upon coder review may  be revised to meet current compliance requirements. Jonathon Bellows, MD Jonathon Bellows MD, MD 02/11/2016 9:10:08 AM This report has been signed electronically. Number of Addenda: 0 Note Initiated On: 02/11/2016 8:36 AM Scope Withdrawal Time: 0 hours 13 minutes 36 seconds  Total Procedure Duration: 0 hours 21 minutes 52 seconds       [facility]

## 2016-02-11 NOTE — Anesthesia Postprocedure Evaluation (Signed)
Anesthesia Post Note  Patient: Research scientist (life sciences)  Procedure(s) Performed: Procedure(s) (LRB): COLONOSCOPY WITH PROPOFOL (N/A)  Patient location during evaluation: Endoscopy Anesthesia Type: General Level of consciousness: awake and alert Pain management: pain level controlled Vital Signs Assessment: post-procedure vital signs reviewed and stable Respiratory status: spontaneous breathing, nonlabored ventilation, respiratory function stable and patient connected to nasal cannula oxygen Cardiovascular status: blood pressure returned to baseline and stable Postop Assessment: no signs of nausea or vomiting Anesthetic complications: no     Last Vitals:  Vitals:   02/11/16 0932 02/11/16 0952  BP: 121/69 110/64  Pulse: 92 81  Resp: 16 19  Temp:      Last Pain:  Vitals:   02/11/16 0912  TempSrc: Tympanic                 Precious Haws Piscitello

## 2016-02-11 NOTE — Transfer of Care (Signed)
Immediate Anesthesia Transfer of Care Note  Patient: Jennifer [name]  Procedure(s) Performed: Procedure(s): COLONOSCOPY WITH PROPOFOL (N/A)  Patient Location: PACU  Anesthesia Type:General  Level of Consciousness: awake, alert  and sedated  Airway & Oxygen Therapy: Patient Spontanous Breathing and Patient connected to nasal cannula oxygen  Post-op Assessment: Report given to RN and Post -op Vital signs reviewed and stable  Post vital signs: Reviewed and stable  Last Vitals:  Vitals:   02/11/16 0805  BP: 139/76  Pulse: 85  Resp: 14  Temp: (!) 35.7 C    Last Pain:  Vitals:   02/11/16 0805  TempSrc: Tympanic         Complications: No apparent anesthesia complications

## 2016-02-11 NOTE — H&P (Signed)
Jonathon Bellows MD [address], [place], [place] [address] Phone: [phone] Fax : [phone]  Primary Care Physician:  [name], FNP Primary Gastroenterologist:  Dr. Jonathon Bellows   Pre-Procedure History & Physical: HPI:  Jennifer [name] is a 66 y.o. female is here for an colonoscopy.   Past Medical History:  Diagnosis Date  . High cholesterol   . Hypertension   . Hypothyroidism     Past Surgical History:  Procedure Laterality Date  . ABDOMINAL HYSTERECTOMY    . bariatric sleeve  2015  . BREAST EXCISIONAL BIOPSY Left 1998  . carpal tunnel repair    . HEMORRHOID SURGERY      Prior to Admission medications   Medication Sig Start Date End Date Taking? Authorizing Provider  amLODipine (NORVASC) 5 MG tablet Take 1 tablet (5 mg total) by mouth daily. 01/21/16  Yes [name], FNP  Biotin 5000 MCG CAPS Take 5,000 mcg by mouth daily.    Yes Historical Provider, MD  cholecalciferol (VITAMIN D) 1000 UNITS tablet Take 400 Units by mouth daily.    Yes Historical Provider, MD  levothyroxine (SYNTHROID, LEVOTHROID) 25 MCG tablet Take 1 tablet (25 mcg total) by mouth daily before breakfast. 01/15/16  Yes [name], FNP  Multiple Vitamins-Minerals (MULTIVITAMIN WITH MINERALS) tablet Take 1 tablet by mouth daily.   Yes Historical Provider, MD  omeprazole (PRILOSEC) 20 MG capsule Take 20 mg by mouth daily.   Yes Historical Provider, MD  pravastatin (PRAVACHOL) 40 MG tablet Take 40 mg by mouth daily.   Yes Historical Provider, MD  traZODone (DESYREL) 50 MG tablet Take 1 tablet (50 mg total) by mouth at bedtime. 01/15/16  Yes [name], FNP  valsartan-hydrochlorothiazide (DIOVAN-HCT) 320-12.5 MG tablet Take 1 tablet by mouth daily. 01/21/16  Yes [name], FNP  acetaminophen (TYLENOL) 500 MG tablet Take 1,000 mg by mouth every 6 (six) hours as needed.     Historical Provider, MD  acyclovir (ZOVIRAX) 400 MG tablet Take 1 tablet (400 mg total) by mouth  daily. 12/08/15   [name], FNP  aspirin 81 MG tablet Take 81 mg by mouth daily.    Historical Provider, MD  diclofenac sodium (VOLTAREN) 1 % GEL Apply 2 g topically 4 (four) times daily. 12/24/15   [name], FNP  Lifitegrast Shirley Friar) 5 % SOLN Apply 1 drop to eye daily as needed.     Historical Provider, MD  sodium chloride (OCEAN) 0.65 % SOLN nasal spray Place 1 spray into both nostrils as needed for congestion.    Historical Provider, MD    Allergies as of 01/05/2016 - Review Complete 12/24/2015  Allergen Reaction Noted  . Nasacort [triamcinolone] Other (See Comments) 12/07/2015  . Vicodin [hydrocodone-acetaminophen] Nausea Only 09/03/2014    Family History  Problem Relation Age of Onset  . Breast cancer Sister 54    materal 1/2 sister    Social History   Social History  . Marital status: Married    Spouse name: N/A  . Number of children: N/A  . Years of education: N/A   Occupational History  . Not on file.   Social History Main Topics  . Smoking status: Former Research scientist (life sciences)  . Smokeless tobacco: Never Used  . Alcohol use 8.4 oz/week    14 Glasses of wine per week  . Drug use: No  . Sexual activity: Not on file   Other Topics Concern  . Not on file   Social History Narrative  Lives in [place].    Married.    Retired 2015, Interior and spatial designer.    One son; granddaughter.     Review of Systems: See HPI, otherwise negative ROS  Physical Exam: BP 139/76   Pulse 85   Temp (!) 96.3 F (35.7 C) (Tympanic)   Resp 14   Ht 5\' 2"  (1.575 m)   Wt 178 lb (80.7 kg)   SpO2 100%   BMI 32.56 kg/m  General:   Alert,  pleasant and cooperative in NAD Head:  Normocephalic and atraumatic. Neck:  Supple; no masses or thyromegaly. Lungs:  Clear throughout to auscultation.    Heart:  Regular rate and rhythm. Abdomen:  Soft, nontender and nondistended. Normal bowel sounds, without guarding, and without rebound.   Neurologic:  Alert and  oriented x4;  grossly  normal neurologically.  Impression/Plan: Jennifer [name] is here for an colonoscopy to be performed for prior history of polyps- surveillance   Risks, benefits, limitations, and alternatives regarding  colonoscopy have been reviewed with the patient.  Questions have been answered.  All parties agreeable.   Jonathon Bellows, MD  02/11/2016, 8:30 AM

## 2016-02-11 NOTE — Anesthesia Postprocedure Evaluation (Deleted)
Anesthesia Post Note  Patient: Research scientist (life sciences)  Procedure(s) Performed: Procedure(s) (LRB): COLONOSCOPY WITH PROPOFOL (N/A)  Patient location during evaluation: Endoscopy Anesthesia Type: General Level of consciousness: awake and alert Pain management: pain level controlled Vital Signs Assessment: post-procedure vital signs reviewed and stable Respiratory status: spontaneous breathing, nonlabored ventilation, respiratory function stable and patient connected to nasal cannula oxygen Cardiovascular status: blood pressure returned to baseline and stable Postop Assessment: no signs of nausea or vomiting Anesthetic complications: no     Last Vitals:  Vitals:   02/11/16 0932 02/11/16 0952  BP: 121/69 110/64  Pulse: 92 81  Resp: 16 19  Temp:      Last Pain:  Vitals:   02/11/16 0912  TempSrc: Tympanic                 Precious Haws Piscitello

## 2016-02-11 NOTE — Anesthesia Post-op Follow-up Note (Cosign Needed)
Anesthesia QCDR form completed.        

## 2016-02-12 ENCOUNTER — Encounter: Payer: Self-pay | Admitting: Gastroenterology

## 2016-02-12 LAB — SURGICAL PATHOLOGY

## 2016-02-12 NOTE — Progress Notes (Signed)
Repeat in 5 years.

## 2016-02-17 ENCOUNTER — Ambulatory Visit: Payer: Medicare Other | Attending: Neurology

## 2016-02-17 DIAGNOSIS — G4761 Periodic limb movement disorder: Secondary | ICD-10-CM | POA: Diagnosis not present

## 2016-02-17 DIAGNOSIS — G4733 Obstructive sleep apnea (adult) (pediatric): Secondary | ICD-10-CM | POA: Diagnosis not present

## 2016-02-18 ENCOUNTER — Other Ambulatory Visit: Payer: Self-pay | Admitting: Family

## 2016-02-18 DIAGNOSIS — Z1231 Encounter for screening mammogram for malignant neoplasm of breast: Secondary | ICD-10-CM

## 2016-02-20 DIAGNOSIS — G4761 Periodic limb movement disorder: Secondary | ICD-10-CM | POA: Diagnosis not present

## 2016-02-20 DIAGNOSIS — G4733 Obstructive sleep apnea (adult) (pediatric): Secondary | ICD-10-CM | POA: Diagnosis not present

## 2016-02-22 ENCOUNTER — Ambulatory Visit
Admission: RE | Admit: 2016-02-22 | Discharge: 2016-02-22 | Disposition: A | Discharge status: Home / Self Care | Payer: Medicare Other | Source: Ambulatory Visit | Attending: Family | Admitting: Family

## 2016-02-22 ENCOUNTER — Telehealth: Payer: Self-pay

## 2016-02-22 DIAGNOSIS — Z1231 Encounter for screening mammogram for malignant neoplasm of breast: Secondary | ICD-10-CM | POA: Insufficient documentation

## 2016-02-24 ENCOUNTER — Other Ambulatory Visit: Payer: Self-pay

## 2016-02-24 ENCOUNTER — Telehealth: Payer: Self-pay | Admitting: Family

## 2016-02-24 DIAGNOSIS — M545 Low back pain: Secondary | ICD-10-CM | POA: Diagnosis not present

## 2016-02-24 DIAGNOSIS — M5416 Radiculopathy, lumbar region: Secondary | ICD-10-CM | POA: Diagnosis not present

## 2016-02-24 DIAGNOSIS — G4733 Obstructive sleep apnea (adult) (pediatric): Secondary | ICD-10-CM

## 2016-02-24 DIAGNOSIS — M5136 Other intervertebral disc degeneration, lumbar region: Secondary | ICD-10-CM | POA: Diagnosis not present

## 2016-02-24 MED ORDER — HYDROCORTISONE ACETATE 25 MG RE SUPP: 25.0000 mg | Freq: Two times a day (BID) | RECTAL | 1 refills | Status: DC

## 2016-02-24 NOTE — Telephone Encounter (Signed)
Patient has been informed. Patient stated that she will discuss results further at next appointment with [place].

## 2016-02-24 NOTE — Telephone Encounter (Signed)
Call pt  Sleep study shows sleep apnea and we recommend a cipap.   Has she been contacted to have machine set up?

## 2016-02-24 NOTE — Telephone Encounter (Signed)
Pt called stating that ever since her Colonoscopy she has been having rectal bleeding. Mostly when she has a bowel movement. Internal hemorrhoids were noted on colonoscopy. No other symptoms. Please advise.

## 2016-02-25 NOTE — Telephone Encounter (Signed)
As discussed yesterday trial of Anusol and check on her next week

## 2016-02-26 DIAGNOSIS — M5416 Radiculopathy, lumbar region: Secondary | ICD-10-CM | POA: Diagnosis not present

## 2016-02-26 DIAGNOSIS — M545 Low back pain: Secondary | ICD-10-CM | POA: Diagnosis not present

## 2016-02-29 DIAGNOSIS — M545 Low back pain: Secondary | ICD-10-CM | POA: Diagnosis not present

## 2016-02-29 DIAGNOSIS — M5416 Radiculopathy, lumbar region: Secondary | ICD-10-CM | POA: Diagnosis not present

## 2016-04-06 ENCOUNTER — Encounter: Payer: Self-pay | Admitting: Family

## 2016-04-06 ENCOUNTER — Ambulatory Visit (INDEPENDENT_AMBULATORY_CARE_PROVIDER_SITE_OTHER): Payer: Medicare Other | Admitting: Family

## 2016-04-06 ENCOUNTER — Telehealth: Payer: Self-pay | Admitting: Family

## 2016-04-06 VITALS — BP 126/60 | HR 93 | Temp 98.3°F | Ht 62.0 in | Wt 190.0 lb

## 2016-04-06 DIAGNOSIS — J302 Other seasonal allergic rhinitis: Secondary | ICD-10-CM

## 2016-04-06 DIAGNOSIS — K760 Fatty (change of) liver, not elsewhere classified: Secondary | ICD-10-CM

## 2016-04-06 DIAGNOSIS — G4733 Obstructive sleep apnea (adult) (pediatric): Secondary | ICD-10-CM

## 2016-04-06 DIAGNOSIS — R748 Abnormal levels of other serum enzymes: Secondary | ICD-10-CM | POA: Diagnosis not present

## 2016-04-06 DIAGNOSIS — I10 Essential (primary) hypertension: Secondary | ICD-10-CM

## 2016-04-06 DIAGNOSIS — Z23 Encounter for immunization: Secondary | ICD-10-CM | POA: Diagnosis not present

## 2016-04-06 DIAGNOSIS — R945 Abnormal results of liver function studies: Secondary | ICD-10-CM | POA: Diagnosis not present

## 2016-04-06 LAB — COMPREHENSIVE METABOLIC PANEL
ALT: 82 U/L — ABNORMAL HIGH (ref 0–35)
AST: 78 U/L — ABNORMAL HIGH (ref 0–37)
Albumin: 4.4 g/dL (ref 3.5–5.2)
Alkaline Phosphatase: 75 U/L (ref 39–117)
BUN: 15 mg/dL (ref 6–23)
CO2: 27 mEq/L (ref 19–32)
Calcium: 10 mg/dL (ref 8.4–10.5)
Chloride: 103 mEq/L (ref 96–112)
Creatinine, Ser: 0.95 mg/dL (ref 0.40–1.20)
GFR: 75.54 mL/min (ref >60.00)
Glucose, Bld: 116 mg/dL — ABNORMAL HIGH (ref 70–99)
Potassium: 3.5 mEq/L (ref 3.5–5.1)
Sodium: 138 mEq/L (ref 135–145)
Total Bilirubin: 0.5 mg/dL (ref 0.2–1.2)
Total Protein: 7.5 g/dL (ref 6.0–8.3)

## 2016-04-06 NOTE — Patient Instructions (Addendum)
Start flonase.   Labs today  Pleasure seeing you

## 2016-04-06 NOTE — Assessment & Plan Note (Signed)
Suspect allergy v viral etiology. Advised to add flonase. Continue zyrtec. Advised to let me know if doesn't improve

## 2016-04-06 NOTE — Progress Notes (Signed)
Subjective:    Patient ID: Jennifer [name], female    DOB: [birth year], 66 y.o.   MRN: [id number]  CC: Jennifer [name] is a 66 y.o. female who presents today for follow up.   HPI: Complains of sore throat, clear sinus drainage for 2-3 days, somewhat improved. Using zyrtec, tyelonol with some relief.  NO SOB, fever, wheezing.   Hasn't seen GI Dr Darnell Level to discuss persistently elevated liver enzymes  HTN- increased valsartan at last visit. Doing well and compliant. Averages 120/70. Denies exertional chest pain or pressure, numbness or tingling radiating to left arm or jaw, palpitations, dizziness, frequent headaches, changes in vision, or shortness of breath.       HISTORY:  Past Medical History:  Diagnosis Date  . High cholesterol   . Hypertension   . Hypothyroidism    Past Surgical History:  Procedure Laterality Date  . ABDOMINAL HYSTERECTOMY    . bariatric sleeve  2015  . BREAST EXCISIONAL BIOPSY Left 1998  . carpal tunnel repair    . COLONOSCOPY WITH PROPOFOL N/A 02/11/2016   Procedure: COLONOSCOPY WITH PROPOFOL;  Surgeon: Jonathon Bellows, MD;  Location: [facility] ENDOSCOPY;  Service: Endoscopy;  Laterality: N/A;  . HEMORRHOID SURGERY     Family History  Problem Relation Age of Onset  . Breast cancer Sister 69    materal 1/2 sister    Allergies: Nasacort [triamcinolone] and Vicodin [hydrocodone-acetaminophen] Current Outpatient Prescriptions on File Prior to Visit  Medication Sig Dispense Refill  . acetaminophen (TYLENOL) 500 MG tablet Take 1,000 mg by mouth every 6 (six) hours as needed.     [name] Kitchen acyclovir (ZOVIRAX) 400 MG tablet Take 1 tablet (400 mg total) by mouth daily. 90 tablet 1  . amLODipine (NORVASC) 5 MG tablet Take 1 tablet (5 mg total) by mouth daily. 90 tablet 1  . aspirin 81 MG tablet Take 81 mg by mouth daily.    . Biotin 5000 MCG CAPS Take 5,000 mcg by mouth daily.     . cholecalciferol (VITAMIN D) 1000 UNITS tablet Take 400 Units by mouth daily.     .  diclofenac sodium (VOLTAREN) 1 % GEL Apply 2 g topically 4 (four) times daily. 100 g 3  . levothyroxine (SYNTHROID, LEVOTHROID) 25 MCG tablet Take 1 tablet (25 mcg total) by mouth daily before breakfast. 90 tablet 3  . Lifitegrast (XIIDRA) 5 % SOLN Apply 1 drop to eye daily as needed.     . Multiple Vitamins-Minerals (MULTIVITAMIN WITH MINERALS) tablet Take 1 tablet by mouth daily.    [name] Kitchen omeprazole (PRILOSEC) 20 MG capsule Take 20 mg by mouth daily.    . pravastatin (PRAVACHOL) 40 MG tablet Take 40 mg by mouth daily.    . sodium chloride (OCEAN) 0.65 % SOLN nasal spray Place 1 spray into both nostrils as needed for congestion.    . traZODone (DESYREL) 50 MG tablet Take 1 tablet (50 mg total) by mouth at bedtime. 90 tablet 1  . valsartan-hydrochlorothiazide (DIOVAN-HCT) 320-12.5 MG tablet Take 1 tablet by mouth daily. 90 tablet 1   No current facility-administered medications on file prior to visit.     Social History  Substance Use Topics  . Smoking status: Former Research scientist (life sciences)  . Smokeless tobacco: Never Used  . Alcohol use 8.4 oz/week    14 Glasses of wine per week    Review of Systems  Constitutional: Negative for chills and fever.  HENT: Positive for congestion, postnasal drip, rhinorrhea and sore throat.   Eyes: Negative for  visual disturbance.  Respiratory: Negative for cough, shortness of breath and wheezing.   Cardiovascular: Negative for chest pain and palpitations.  Gastrointestinal: Negative for nausea and vomiting.  Neurological: Negative for headaches.      Objective:    BP 126/60   Pulse 93   Temp 98.3 F (36.8 C) (Oral)   Ht 5\' 2"  (1.575 m)   Wt 190 lb (86.2 kg)   SpO2 95%   BMI 34.75 kg/m  BP Readings from Last 3 Encounters:  04/06/16 126/60  02/11/16 110/64  12/24/15 132/62   Wt Readings from Last 3 Encounters:  04/06/16 190 lb (86.2 kg)  02/11/16 178 lb (80.7 kg)  12/24/15 182 lb 12.8 oz (82.9 kg)    Physical Exam  Constitutional: She appears  well-developed and well-nourished.  HENT:  Head: Normocephalic and atraumatic.  Right Ear: Hearing, tympanic membrane, external ear and ear canal normal. No drainage, swelling or tenderness. No foreign bodies. Tympanic membrane is not erythematous and not bulging. No middle ear effusion. No decreased hearing is noted.  Left Ear: Hearing, tympanic membrane, external ear and ear canal normal. No drainage, swelling or tenderness. No foreign bodies. Tympanic membrane is not erythematous and not bulging.  No middle ear effusion. No decreased hearing is noted.  Nose: Nose normal. No rhinorrhea. Right sinus exhibits no maxillary sinus tenderness and no frontal sinus tenderness. Left sinus exhibits no maxillary sinus tenderness and no frontal sinus tenderness.  Mouth/Throat: Uvula is midline, oropharynx is clear and moist and mucous membranes are normal. No oropharyngeal exudate, posterior oropharyngeal edema, posterior oropharyngeal erythema or tonsillar abscesses.  Eyes: Conjunctivae are normal.  Cardiovascular: Regular rhythm, normal heart sounds and normal pulses.   Pulmonary/Chest: Effort normal and breath sounds normal. She has no wheezes. She has no rhonchi. She has no rales.  Lymphadenopathy:       Head (right side): No submental, no submandibular, no tonsillar, no preauricular, no posterior auricular and no occipital adenopathy present.       Head (left side): No submental, no submandibular, no tonsillar, no preauricular, no posterior auricular and no occipital adenopathy present.    She has no cervical adenopathy.  Neurological: She is alert.  Skin: Skin is warm and dry.  Psychiatric: She has a normal mood and affect. Her speech is normal and behavior is normal. Thought content normal.  Vitals reviewed.      Assessment & Plan:   Problem List Items Addressed This Visit      Cardiovascular and Mediastinum   Essential hypertension - Primary    At goal. Pending cmp.      Relevant Orders     Comprehensive metabolic panel     Respiratory   Allergic rhinitis    Suspect allergy v viral etiology. Advised to add flonase. Continue zyrtec. Advised to let me know if doesn't improve      OSA (obstructive sleep apnea)    Needs cipap; referral placed.       Relevant Orders   Ambulatory referral to Sleep Studies     Digestive   Fatty liver   Relevant Orders   Comprehensive metabolic panel   Acute Hep Panel & Hep B Surface Ab   Anti-smooth muscle antibody, IgG   Ambulatory referral to Sleep Studies   Antinuclear Antib (ANA)     Other   Elevated liver enzymes    Pending further lab work up. Not discussed with GI at this point. Will refer back to Dr Darnell Level depending on liver enzymes  and labs today.       Relevant Orders   Mitochondrial Antibodies   Antinuclear Antib (ANA)       I am having Ms. Escoto maintain her pravastatin, Biotin, aspirin, cholecalciferol, omeprazole, Lifitegrast, acetaminophen, multivitamin with minerals, sodium chloride, acyclovir, diclofenac sodium, levothyroxine, traZODone, valsartan-hydrochlorothiazide, and amLODipine.   No orders of the defined types were placed in this encounter.   Return precautions given.   Risks, benefits, and alternatives of the medications and treatment plan prescribed today were discussed, and patient expressed understanding.   Education regarding symptom management and diagnosis given to patient on AVS.  Continue to follow with [name], FNP for routine health maintenance.   Jennifer [name] and I agreed with plan.   [name], FNP

## 2016-04-06 NOTE — Assessment & Plan Note (Signed)
At goal. Pending cmp.

## 2016-04-06 NOTE — Telephone Encounter (Signed)
Would you mind calling patient and informing her that prevnar was a duplicate. This is MY error as this was seen later in the external list of medications.   There isnt any harm from having it twice.   She will need the pneumococcal 23 in one year from now and then she should be complete with ALL PNA vaccines.

## 2016-04-06 NOTE — Assessment & Plan Note (Signed)
Pending further lab work up. Not discussed with GI at this point. Will refer back to Dr Darnell Level depending on liver enzymes and labs today.

## 2016-04-06 NOTE — Assessment & Plan Note (Signed)
Needs cipap; referral placed.

## 2016-04-06 NOTE — Progress Notes (Signed)
Pre visit review using our clinic review tool, if applicable. No additional management support is needed unless otherwise documented below in the visit note. 

## 2016-04-07 DIAGNOSIS — R9431 Abnormal electrocardiogram [ECG] [EKG]: Secondary | ICD-10-CM | POA: Diagnosis not present

## 2016-04-07 DIAGNOSIS — M1990 Unspecified osteoarthritis, unspecified site: Secondary | ICD-10-CM | POA: Diagnosis not present

## 2016-04-07 DIAGNOSIS — I10 Essential (primary) hypertension: Secondary | ICD-10-CM | POA: Diagnosis not present

## 2016-04-07 DIAGNOSIS — E669 Obesity, unspecified: Secondary | ICD-10-CM | POA: Diagnosis not present

## 2016-04-07 DIAGNOSIS — R001 Bradycardia, unspecified: Secondary | ICD-10-CM | POA: Diagnosis not present

## 2016-04-07 DIAGNOSIS — R002 Palpitations: Secondary | ICD-10-CM | POA: Diagnosis not present

## 2016-04-07 DIAGNOSIS — E079 Disorder of thyroid, unspecified: Secondary | ICD-10-CM | POA: Diagnosis not present

## 2016-04-07 DIAGNOSIS — E784 Other hyperlipidemia: Secondary | ICD-10-CM | POA: Diagnosis not present

## 2016-04-07 LAB — ANTI-NUCLEAR AB-TITER (ANA TITER): ANA Titer 1: 1:160 {titer} — ABNORMAL HIGH

## 2016-04-07 LAB — ANA: Anti Nuclear Antibody(ANA): POSITIVE — AB

## 2016-04-07 LAB — ACUTE HEP PANEL AND HEP B SURFACE AB
HCV Ab: NEGATIVE
Hep A IgM: NONREACTIVE
Hep B C IgM: NONREACTIVE
Hep B S Ab: POSITIVE — AB
Hepatitis B Surface Ag: NEGATIVE

## 2016-04-08 LAB — MITOCHONDRIAL ANTIBODIES: Mitochondrial M2 Ab, IgG: <=20 Units (ref <=20.0)

## 2016-04-08 LAB — ANTI-SMOOTH MUSCLE ANTIBODY, IGG: Smooth Muscle Ab: <20 U (ref <20)

## 2016-04-08 NOTE — Telephone Encounter (Signed)
Patient has been informed.

## 2016-04-11 ENCOUNTER — Other Ambulatory Visit: Payer: Self-pay | Admitting: Family

## 2016-04-11 ENCOUNTER — Telehealth: Payer: Self-pay

## 2016-04-11 DIAGNOSIS — J0110 Acute frontal sinusitis, unspecified: Secondary | ICD-10-CM

## 2016-04-11 DIAGNOSIS — R748 Abnormal levels of other serum enzymes: Secondary | ICD-10-CM

## 2016-04-11 MED ORDER — AMOXICILLIN 500 MG PO CAPS: 500.0000 mg | ORAL_CAPSULE | Freq: Two times a day (BID) | ORAL | 0 refills | Status: DC

## 2016-04-11 NOTE — Telephone Encounter (Signed)
Patient stated her sinus SX are still there since last visit. She is wanting an antibiotic.Please advise.

## 2016-04-11 NOTE — Telephone Encounter (Signed)
Patient has been informed.

## 2016-04-11 NOTE — Telephone Encounter (Signed)
Call pt [facility]. Encourage probiotics Let us know if not better

## 2016-05-02 ENCOUNTER — Other Ambulatory Visit: Payer: Self-pay | Admitting: Family

## 2016-05-02 ENCOUNTER — Other Ambulatory Visit: Payer: Self-pay | Admitting: Nurse Practitioner

## 2016-05-02 DIAGNOSIS — R748 Abnormal levels of other serum enzymes: Secondary | ICD-10-CM

## 2016-05-02 DIAGNOSIS — Z86010 Personal history of colonic polyps: Secondary | ICD-10-CM | POA: Insufficient documentation

## 2016-05-02 DIAGNOSIS — K760 Fatty (change of) liver, not elsewhere classified: Secondary | ICD-10-CM

## 2016-05-02 NOTE — Telephone Encounter (Signed)
What is needed?

## 2016-05-02 NOTE — Telephone Encounter (Signed)
Pt called and states that she had a sleep study done a couple of months again and said that she was approved for a CPAP machine but has not heard anything. Pt is looking for an update. Please advise, thank you!  Call pt @ [phone]

## 2016-05-03 NOTE — Telephone Encounter (Signed)
Since this is for DME I'm not sure what is needed. Her sleep study is in her chart. I would ask Lavella Lemons or Juliann Pulse.

## 2016-05-03 NOTE — Telephone Encounter (Signed)
Please advise 

## 2016-05-05 ENCOUNTER — Ambulatory Visit
Admission: RE | Admit: 2016-05-05 | Discharge: 2016-05-05 | Disposition: A | Discharge status: Home / Self Care | Payer: Medicare Other | Source: Ambulatory Visit | Attending: Nurse Practitioner | Admitting: Nurse Practitioner

## 2016-05-05 DIAGNOSIS — K760 Fatty (change of) liver, not elsewhere classified: Secondary | ICD-10-CM | POA: Insufficient documentation

## 2016-05-05 DIAGNOSIS — R748 Abnormal levels of other serum enzymes: Secondary | ICD-10-CM | POA: Diagnosis not present

## 2016-05-06 NOTE — Telephone Encounter (Signed)
Pt i

## 2016-05-09 NOTE — Telephone Encounter (Signed)
Looking at the sleep study Dr. Brunetta Jeans  recommendation was for sleep study with CPAP m Titration which this would need and order and then, after the Titration study patient would be given order for CPAP  With the appropriate settings.

## 2016-05-16 DIAGNOSIS — H2513 Age-related nuclear cataract, bilateral: Secondary | ICD-10-CM | POA: Diagnosis not present

## 2016-05-27 ENCOUNTER — Other Ambulatory Visit: Payer: Self-pay

## 2016-05-27 MED ORDER — PRAVASTATIN SODIUM 40 MG PO TABS: 40.0000 mg | ORAL_TABLET | Freq: Every day | ORAL | 1 refills | Status: DC

## 2016-05-27 MED ORDER — OMEPRAZOLE 20 MG PO CPDR: 20.0000 mg | DELAYED_RELEASE_CAPSULE | Freq: Every day | ORAL | 2 refills | Status: DC

## 2016-05-27 NOTE — Telephone Encounter (Signed)
Medication has been refilled.

## 2016-06-10 ENCOUNTER — Telehealth: Payer: Self-pay | Admitting: Family

## 2016-06-10 ENCOUNTER — Telehealth: Payer: Self-pay | Admitting: Gastroenterology

## 2016-06-10 MED ORDER — VALSARTAN 320 MG PO TABS: 320.0000 mg | ORAL_TABLET | Freq: Every day | ORAL | 0 refills | Status: DC

## 2016-06-10 NOTE — Telephone Encounter (Signed)
Blood pressure has been running low for the past week    Can't find readings for this week  Today BP readings as follows Pulse 66 10:30 am Pulse 71 BP 103/57 10:00 am  Pulse 66 BP 111/63 10:30 am 5/24 Pulse 70  BP 103/57  Still feeling dizzy when bends over and raises up.   When walking muscles ache .   No fever .  Some sinus drainage.   Also having some constipation.  Taking Dulcolax, stool softner and using suppository.  Last bowel movement today soft but had to strain to pass movement.       She is on valsartan -HCTZ, Amlodipine for blood pressure .  She is just wondering if she should be considered about blood pressure.  Please advise.  Advised that you were out of office today and maybe later before I got back to her.

## 2016-06-10 NOTE — Telephone Encounter (Signed)
She has a lot going on - she may need urgent care.  Suspect blood pressure is making her dizzy  She may stop the diovan  You may call in valsartan 320 mg P.O. qd 90 tabs . This way she is not taking the hctz component of combo pill  She may follow up with me next week.

## 2016-06-10 NOTE — Telephone Encounter (Signed)
Patient prefers to stop Diovan and change to valsartan 320 mg . Advised to discontinue valsartan-HCTZ.   Patient advised of below and verbalized understanding.   Appointment scheduled for 06/16/16 .  Advised to continue to monitor blood pressure.  She declined to go to urgent care.   Script sent

## 2016-06-10 NOTE — Telephone Encounter (Signed)
Pt called and stated that her bp has been running low for the past week. It has been around 103/57. Pt states that she is feeling tired and her muscles are sore. Pt is wondering if she should be alarmed. Please advise, thank you!  Call pt @ [phone]

## 2016-06-10 NOTE — Telephone Encounter (Signed)
Pt called back and stated that she is also feeling dizziness and lightheadedness.

## 2016-06-10 NOTE — Telephone Encounter (Signed)
Patient would like to know when she is due back for another colonoscopy. She stated they found several poylps. Please call

## 2016-06-12 ENCOUNTER — Other Ambulatory Visit: Payer: Self-pay | Admitting: Family

## 2016-06-12 DIAGNOSIS — I10 Essential (primary) hypertension: Secondary | ICD-10-CM

## 2016-06-16 ENCOUNTER — Encounter: Payer: Self-pay | Admitting: Family

## 2016-06-16 ENCOUNTER — Ambulatory Visit (INDEPENDENT_AMBULATORY_CARE_PROVIDER_SITE_OTHER): Payer: Medicare Other | Admitting: Family

## 2016-06-16 VITALS — BP 126/60 | HR 51 | Temp 98.4°F | Ht 62.0 in | Wt 186.2 lb

## 2016-06-16 DIAGNOSIS — R001 Bradycardia, unspecified: Secondary | ICD-10-CM | POA: Insufficient documentation

## 2016-06-16 DIAGNOSIS — G4733 Obstructive sleep apnea (adult) (pediatric): Secondary | ICD-10-CM | POA: Diagnosis not present

## 2016-06-16 DIAGNOSIS — I10 Essential (primary) hypertension: Secondary | ICD-10-CM

## 2016-06-16 NOTE — Telephone Encounter (Signed)
Pt never rec'ed cipap machine  Have reordered

## 2016-06-16 NOTE — Patient Instructions (Addendum)
Let me know if you do not get supplies for cipap machine  Continue to be very vigilant regarding dizziness and heart rate. I'm concerned because she heart rate tends to run on the low side.  APPT with [facility] tomorrow at 945

## 2016-06-16 NOTE — Assessment & Plan Note (Addendum)
Not symptomatic in office. Patient is not orthostatic (see flow sheet). Was able to speak with [facility]'s office whom advised more comfortable seeing patient and not to change medications at this time. [facility] offered patient appointment this afternoon at 2 PM or tomorrow morning at 9:45 AM, patient took 2pm afternoon appointment.  EKG shows bradycardia. No ischemic changes. When compared to prior EKG 2016, no significant changes.  No recent echo. Advised close vigilance until can be seen by [facility].

## 2016-06-16 NOTE — Addendum Note (Signed)
Addended by: [name] on: 06/16/2016 11:04 AM   Modules accepted: Orders

## 2016-06-16 NOTE — Assessment & Plan Note (Signed)
Reordered supplies

## 2016-06-16 NOTE — Assessment & Plan Note (Signed)
Dizziness improved when we stopped the HCTZ. Blood pressure remains at goal.

## 2016-06-16 NOTE — Progress Notes (Signed)
Subjective:    Patient ID: Jennifer [name], female    DOB: 1950/07/27, 66 y.o.   MRN: [id number]  CC: Jennifer [name] is a 66 y.o. female who presents today for an acute visit.    HPI: HTN- Stopped diovan due to dizziness, since resolved. No syncope. BP prior to stopping had been 105/53, 71. Then after came up to 129/69, 61, and 156/81, 58 since holding hctz.   Only on norvasc 5mg , valsartan 320mg .   Denies exertional chest pain or pressure, numbness or tingling radiating to left arm or jaw, palpitations, dizziness, frequent headaches, changes in vision, or shortness of breath.   [facility] 03/2016- no changes to medication for HTN; HR 77.   OSA- not wearing cipap; doesn't have machine.      HISTORY:  Past Medical History:  Diagnosis Date  . High cholesterol   . Hypertension   . Hypothyroidism    Past Surgical History:  Procedure Laterality Date  . ABDOMINAL HYSTERECTOMY    . bariatric sleeve  2015  . BREAST EXCISIONAL BIOPSY Left 1998  . carpal tunnel repair    . COLONOSCOPY WITH PROPOFOL N/A 02/11/2016   Procedure: COLONOSCOPY WITH PROPOFOL;  Surgeon: Jonathon Bellows, MD;  Location: [facility] ENDOSCOPY;  Service: Endoscopy;  Laterality: N/A;  . HEMORRHOID SURGERY     Family History  Problem Relation Age of Onset  . Breast cancer Sister 2       materal 1/2 sister    Allergies: Pollen extract; Nasacort [triamcinolone]; and Vicodin [hydrocodone-acetaminophen] Current Outpatient Prescriptions on File Prior to Visit  Medication Sig Dispense Refill  . acetaminophen (TYLENOL) 500 MG tablet Take 1,000 mg by mouth every 6 (six) hours as needed.     [name] Kitchen acyclovir (ZOVIRAX) 400 MG tablet Take 1 tablet (400 mg total) by mouth daily. 90 tablet 1  . amLODipine (NORVASC) 5 MG tablet Take 1 tablet (5 mg total) by mouth daily. 90 tablet 1  . aspirin 81 MG tablet Take 81 mg by mouth daily.    . Biotin 5000 MCG CAPS Take 5,000 mcg by mouth daily.     . cholecalciferol (VITAMIN D) 1000 UNITS  tablet Take 400 Units by mouth daily.     [name] Kitchen levothyroxine (SYNTHROID, LEVOTHROID) 25 MCG tablet Take 1 tablet (25 mcg total) by mouth daily before breakfast. 90 tablet 3  . Lifitegrast (XIIDRA) 5 % SOLN Apply 1 drop to eye daily as needed.     . Multiple Vitamins-Minerals (MULTIVITAMIN WITH MINERALS) tablet Take 1 tablet by mouth daily.    [name] Kitchen omeprazole (PRILOSEC) 20 MG capsule Take 1 capsule (20 mg total) by mouth daily. 90 capsule 2  . pravastatin (PRAVACHOL) 40 MG tablet Take 1 tablet (40 mg total) by mouth daily. 90 tablet 1  . sodium chloride (OCEAN) 0.65 % SOLN nasal spray Place 1 spray into both nostrils as needed for congestion.    . traZODone (DESYREL) 50 MG tablet Take 1 tablet (50 mg total) by mouth at bedtime. 90 tablet 1  . valsartan-hydrochlorothiazide (DIOVAN-HCT) 320-12.5 MG tablet Take 1 tablet by mouth daily. 90 tablet 1   No current facility-administered medications on file prior to visit.     Social History  Substance Use Topics  . Smoking status: Former Research scientist (life sciences)  . Smokeless tobacco: Never Used  . Alcohol use 8.4 oz/week    14 Glasses of wine per week    Review of Systems  Constitutional: Negative for chills and fever.  Eyes: Negative for visual disturbance.  Respiratory:  Negative for cough.   Cardiovascular: Negative for chest pain and palpitations.  Gastrointestinal: Negative for nausea and vomiting.  Neurological: Negative for dizziness and syncope.      Objective:    BP 126/60   Pulse (!) 51   Temp 98.4 F (36.9 C) (Oral)   Ht 5\' 2"  (1.575 m)   Wt 186 lb 3.2 oz (84.5 kg)   SpO2 97%   BMI 34.06 kg/m    Physical Exam  Constitutional: She appears well-developed and well-nourished.  Eyes: Conjunctivae are normal.  Cardiovascular: Regular rhythm, normal heart sounds and normal pulses.  Bradycardia present.   Pulmonary/Chest: Effort normal and breath sounds normal. She has no wheezes. She has no rhonchi. She has no rales.  Neurological: She is alert.    Skin: Skin is warm and dry.  Psychiatric: She has a normal mood and affect. Her speech is normal and behavior is normal. Thought content normal.  Vitals reviewed.      Assessment & Plan:   Problem List Items Addressed This Visit      Cardiovascular and Mediastinum   Essential hypertension    Dizziness improved when we stopped the HCTZ. Blood pressure remains at goal.         Respiratory   OSA (obstructive sleep apnea)    Reordered supplies        Other   Bradycardia - Primary    Not symptomatic in office. Patient is not orthostatic (see flow sheet). Was able to speak with [facility]'s office whom advised more comfortable seeing patient and not to change medications at this time. [facility] offered patient appointment this afternoon at 2 PM or tomorrow morning at 9:45 AM, patient took 2pm afternoon appointment.  EKG shows bradycardia. No ischemic changes. When compared to prior EKG 2016, no significant changes.  No recent echo. Advised close vigilance until can be seen by [facility]       Relevant Orders   EKG 12-Lead (Completed)        I have discontinued [name]'s diclofenac sodium, amoxicillin, and valsartan. I am also having her maintain her Biotin, aspirin, cholecalciferol, Lifitegrast, acetaminophen, multivitamin with minerals, sodium chloride, acyclovir, levothyroxine, traZODone, valsartan-hydrochlorothiazide, amLODipine, pravastatin, and omeprazole.   No orders of the defined types were placed in this encounter.   Return precautions given.   Risks, benefits, and alternatives of the medications and treatment plan prescribed today were discussed, and patient expressed understanding.   Education regarding symptom management and diagnosis given to patient on AVS.  Continue to follow with [name], FNP for routine health maintenance.   Jennifer [name] and I agreed with plan.   [name], FNP

## 2016-06-16 NOTE — Progress Notes (Signed)
Pre visit review using our clinic review tool, if applicable. No additional management support is needed unless otherwise documented below in the visit note. 

## 2016-06-17 DIAGNOSIS — R9431 Abnormal electrocardiogram [ECG] [EKG]: Secondary | ICD-10-CM | POA: Diagnosis not present

## 2016-06-17 DIAGNOSIS — R42 Dizziness and giddiness: Secondary | ICD-10-CM | POA: Diagnosis not present

## 2016-06-17 DIAGNOSIS — R5383 Other fatigue: Secondary | ICD-10-CM | POA: Diagnosis not present

## 2016-06-17 DIAGNOSIS — R600 Localized edema: Secondary | ICD-10-CM | POA: Diagnosis not present

## 2016-06-17 DIAGNOSIS — R011 Cardiac murmur, unspecified: Secondary | ICD-10-CM | POA: Diagnosis not present

## 2016-06-17 DIAGNOSIS — G4733 Obstructive sleep apnea (adult) (pediatric): Secondary | ICD-10-CM | POA: Diagnosis not present

## 2016-06-17 DIAGNOSIS — R001 Bradycardia, unspecified: Secondary | ICD-10-CM | POA: Diagnosis not present

## 2016-06-17 DIAGNOSIS — R0602 Shortness of breath: Secondary | ICD-10-CM | POA: Diagnosis not present

## 2016-06-17 NOTE — Telephone Encounter (Signed)
Since we have resent the order, we will have to wait for them to send paperwork that they need filled out.  Fax back at a later time. FYI

## 2016-06-17 NOTE — Telephone Encounter (Signed)
Fransisco Beau, please seen margaret's note regarding pt's cpap machine

## 2016-06-20 NOTE — Telephone Encounter (Signed)
noted 

## 2016-06-22 DIAGNOSIS — R001 Bradycardia, unspecified: Secondary | ICD-10-CM | POA: Diagnosis not present

## 2016-06-29 ENCOUNTER — Telehealth: Payer: Self-pay | Admitting: *Deleted

## 2016-06-29 NOTE — Telephone Encounter (Signed)
Patient has been notified.  Informed her to hold the amlodipine. Patient will be scheduling appointment. Patient stated she will comply.

## 2016-06-29 NOTE — Telephone Encounter (Signed)
Patient questioned if she should continue taking her blood pressure medication, she had a blood pressure reading this morning at 0700 of 104/59 pulse 68.Patient is having some fatigue. This reading is the average of what her blood pressure is running. Pt did not take morning dosage of medication,she will wait for advise . Pt contact [phone]

## 2016-06-29 NOTE — Telephone Encounter (Signed)
Call pt  Thank her for letting me know  She may hold norvasc.  Please make follow up to see me soon and let us know if she still feels fatigued of of medication

## 2016-06-29 NOTE — Telephone Encounter (Signed)
Please advise 

## 2016-06-30 ENCOUNTER — Ambulatory Visit (INDEPENDENT_AMBULATORY_CARE_PROVIDER_SITE_OTHER): Payer: Medicare Other | Admitting: Family

## 2016-06-30 ENCOUNTER — Encounter: Payer: Self-pay | Admitting: Family

## 2016-06-30 VITALS — BP 134/70 | HR 70 | Temp 98.3°F | Ht 62.0 in | Wt 183.8 lb

## 2016-06-30 DIAGNOSIS — I10 Essential (primary) hypertension: Secondary | ICD-10-CM | POA: Diagnosis not present

## 2016-06-30 DIAGNOSIS — E785 Hyperlipidemia, unspecified: Secondary | ICD-10-CM

## 2016-06-30 MED ORDER — VALSARTAN 320 MG PO TABS: 320.0000 mg | ORAL_TABLET | Freq: Every day | ORAL | 3 refills | Status: DC

## 2016-06-30 NOTE — Progress Notes (Signed)
Pre visit review using our clinic review tool, if applicable. No additional management support is needed unless otherwise documented below in the visit note. 

## 2016-06-30 NOTE — Progress Notes (Signed)
Subjective:    Patient ID: Jennifer [name], female    DOB: 09/25/1950, 66 y.o.   MRN: [id number]  CC: Jennifer [name] is a 66 y.o. female who presents today for follow up.   HPI: HTN- Follows with [facility]- next appt 08/2016. Only on valsartan 320mg . Stopped norvasc 5mg  yesterday and not feeling as tied today.  Denies exertional chest pain or pressure, numbness or tingling radiating to left arm or jaw, palpitations, dizziness, frequent headaches, changes in vision, or shortness of breath.   Has been focusing on weight loss and loss 7 pounds.    [facility] 06/17/2016-  Bradycardia ( HR was 48 that day) Repeated holter ( no results per patient yet) , exercise stress test ( 07/15/16) Referred to EP for evaluation of bradycardia and possible pacemaker; appt 08/2016  HISTORY:  Past Medical History:  Diagnosis Date  . High cholesterol   . Hypertension   . Hypothyroidism    Past Surgical History:  Procedure Laterality Date  . ABDOMINAL HYSTERECTOMY    . bariatric sleeve  2015  . BREAST EXCISIONAL BIOPSY Left 1998  . carpal tunnel repair    . COLONOSCOPY WITH PROPOFOL N/A 02/11/2016   Procedure: COLONOSCOPY WITH PROPOFOL;  Surgeon: Jennifer Bellows, MD;  Location: [facility] ENDOSCOPY;  Service: Endoscopy;  Laterality: N/A;  . HEMORRHOID SURGERY     Family History  Problem Relation Age of Onset  . Breast cancer Sister 65       materal 1/2 sister    Allergies: Pollen extract; Nasacort [triamcinolone]; and Vicodin [hydrocodone-acetaminophen] Current Outpatient Prescriptions on File Prior to Visit  Medication Sig Dispense Refill  . acetaminophen (TYLENOL) 500 MG tablet Take 1,000 mg by mouth every 6 (six) hours as needed.     [name] Kitchen acyclovir (ZOVIRAX) 400 MG tablet Take 1 tablet (400 mg total) by mouth daily. 90 tablet 1  . aspirin 81 MG tablet Take 81 mg by mouth daily.    . Biotin 5000 MCG CAPS Take 5,000 mcg by mouth daily.     . cholecalciferol (VITAMIN D) 1000 UNITS tablet Take 400 Units by  mouth daily.     [name] Kitchen levothyroxine (SYNTHROID, LEVOTHROID) 25 MCG tablet Take 1 tablet (25 mcg total) by mouth daily before breakfast. 90 tablet 3  . Lifitegrast (XIIDRA) 5 % SOLN Apply 1 drop to eye daily as needed.     . Multiple Vitamins-Minerals (MULTIVITAMIN WITH MINERALS) tablet Take 1 tablet by mouth daily.    [name] Kitchen omeprazole (PRILOSEC) 20 MG capsule Take 1 capsule (20 mg total) by mouth daily. 90 capsule 2  . pravastatin (PRAVACHOL) 40 MG tablet Take 1 tablet (40 mg total) by mouth daily. 90 tablet 1  . sodium chloride (OCEAN) 0.65 % SOLN nasal spray Place 1 spray into both nostrils as needed for congestion.    . traZODone (DESYREL) 50 MG tablet Take 1 tablet (50 mg total) by mouth at bedtime. 90 tablet 1   No current facility-administered medications on file prior to visit.     Social History  Substance Use Topics  . Smoking status: Former Research scientist (life sciences)  . Smokeless tobacco: Never Used  . Alcohol use 8.4 oz/week    14 Glasses of wine per week    Review of Systems  Constitutional: Negative for chills and fever.  Eyes: Negative for visual disturbance.  Respiratory: Negative for cough and shortness of breath.   Cardiovascular: Negative for chest pain and palpitations.  Gastrointestinal: Negative for nausea and vomiting.  Neurological: Negative for dizziness and weakness.  Objective:    BP 134/70   Pulse 70   Temp 98.3 F (36.8 C) (Oral)   Ht 5\' 2"  (1.575 m)   Wt 183 lb 12.8 oz (83.4 kg)   SpO2 94%   BMI 33.62 kg/m  BP Readings from Last 3 Encounters:  06/30/16 134/70  06/16/16 126/60  04/06/16 126/60   Wt Readings from Last 3 Encounters:  06/30/16 183 lb 12.8 oz (83.4 kg)  06/16/16 186 lb 3.2 oz (84.5 kg)  04/06/16 190 lb (86.2 kg)    Physical Exam  Constitutional: She appears well-developed and well-nourished.  Eyes: Conjunctivae are normal.  Cardiovascular: Normal rate, regular rhythm, normal heart sounds and normal pulses.   Pulmonary/Chest: Effort normal and  breath sounds normal. She has no wheezes. She has no rhonchi. She has no rales.  Neurological: She is alert.  Skin: Skin is warm and dry.  Psychiatric: She has a normal mood and affect. Her speech is normal and behavior is normal. Thought content normal.  Vitals reviewed.      Assessment & Plan:   Problem List Items Addressed This Visit      Cardiovascular and Mediastinum   Essential hypertension - Primary    Blood pressure well controlled on valsartan alone today. Discussed at length with patient staying vigilant regarding symptoms. Awaiting referral to EP from Dr. Clayborn [name]. As also discussed with patient, suspect intentional weight loss is bringing blood pressure down and congratulated patient on hard work.      Relevant Medications   valsartan (DIOVAN) 320 MG tablet   Other Relevant Orders   Basic metabolic panel     Other   Hyperlipidemia    Pending lipid panel      Relevant Medications   valsartan (DIOVAN) 320 MG tablet   Other Relevant Orders   Lipid panel   History of bariatric surgery   Bradycardia       I have discontinued Jennifer [name]'s amLODipine. I am also having her maintain her Biotin, aspirin, cholecalciferol, Lifitegrast, acetaminophen, multivitamin with minerals, sodium chloride, acyclovir, levothyroxine, traZODone, pravastatin, omeprazole, and valsartan.   Meds ordered this encounter  Medications  . valsartan (DIOVAN) 320 MG tablet    Sig: Take 1 tablet (320 mg total) by mouth daily.    Dispense:  30 tablet    Refill:  3    Order Specific Question:   Supervising Provider    Answer:   Jennifer [name] [[id number]]    Return precautions given.   Risks, benefits, and alternatives of the medications and treatment plan prescribed today were discussed, and patient expressed understanding.   Education regarding symptom management and diagnosis given to patient on AVS.  Continue to follow with Jennifer Hawthorne, FNP for routine health maintenance.    Jennifer [name] and I agreed with plan.   Jennifer Paris, FNP

## 2016-06-30 NOTE — Patient Instructions (Addendum)
Lipid panel when fasting  My pleasure always seeing you.   As discussed-   while we await appointment with electrophysiologist, please stay incredibly vigilant for dizziness, palpitations, shortness of breath, vertigo, chest pain as I would  be concerned that your heart rate was dropping too  low.   As discussed, I think it is practical to incooperate walking for exercise however keep this at a moderate pace ; if any symptoms please stop immediately and let me know  May hold the Norvasc for now  Follow-up in 3 months

## 2016-06-30 NOTE — Assessment & Plan Note (Signed)
Pending lipid panel 

## 2016-06-30 NOTE — Assessment & Plan Note (Signed)
Blood pressure well controlled on valsartan alone today. Discussed at length with patient staying vigilant regarding symptoms. Awaiting referral to EP from Dr. Clayborn Bigness. As also discussed with patient, suspect intentional weight loss is bringing blood pressure down and congratulated patient on hard work.

## 2016-07-04 ENCOUNTER — Other Ambulatory Visit (INDEPENDENT_AMBULATORY_CARE_PROVIDER_SITE_OTHER): Payer: Medicare Other

## 2016-07-04 DIAGNOSIS — E785 Hyperlipidemia, unspecified: Secondary | ICD-10-CM

## 2016-07-04 DIAGNOSIS — I10 Essential (primary) hypertension: Secondary | ICD-10-CM

## 2016-07-04 LAB — BASIC METABOLIC PANEL
BUN: 15 mg/dL (ref 6–23)
CO2: 30 mEq/L (ref 19–32)
Calcium: 10 mg/dL (ref 8.4–10.5)
Chloride: 107 mEq/L (ref 96–112)
Creatinine, Ser: 0.82 mg/dL (ref 0.40–1.20)
GFR: 89.46 mL/min (ref >60.00)
Glucose, Bld: 103 mg/dL — ABNORMAL HIGH (ref 70–99)
Potassium: 4.6 mEq/L (ref 3.5–5.1)
Sodium: 142 mEq/L (ref 135–145)

## 2016-07-04 LAB — LIPID PANEL
Cholesterol: 163 mg/dL (ref 0–200)
HDL: 47.7 mg/dL (ref >39.00)
LDL Cholesterol: 103 mg/dL — ABNORMAL HIGH (ref 0–99)
NonHDL: 115.58
Total CHOL/HDL Ratio: 3
Triglycerides: 63 mg/dL (ref 0.0–149.0)
VLDL: 12.6 mg/dL (ref 0.0–40.0)

## 2016-07-05 ENCOUNTER — Other Ambulatory Visit: Payer: Self-pay | Admitting: Family

## 2016-07-05 ENCOUNTER — Telehealth: Payer: Self-pay | Admitting: Family

## 2016-07-05 DIAGNOSIS — Z9884 Bariatric surgery status: Secondary | ICD-10-CM | POA: Diagnosis not present

## 2016-07-05 DIAGNOSIS — Z5181 Encounter for therapeutic drug level monitoring: Secondary | ICD-10-CM | POA: Diagnosis not present

## 2016-07-05 DIAGNOSIS — K912 Postsurgical malabsorption, not elsewhere classified: Secondary | ICD-10-CM | POA: Diagnosis not present

## 2016-07-05 DIAGNOSIS — G4733 Obstructive sleep apnea (adult) (pediatric): Secondary | ICD-10-CM

## 2016-07-05 DIAGNOSIS — E785 Hyperlipidemia, unspecified: Secondary | ICD-10-CM

## 2016-07-05 MED ORDER — PRAVASTATIN SODIUM 80 MG PO TABS: 80.0000 mg | ORAL_TABLET | Freq: Every day | ORAL | 1 refills | Status: DC

## 2016-07-05 NOTE — Telephone Encounter (Signed)
Patient had the sleep study and looks like from report recommendations were  For Cpap_titration study, in order for patient to receive CPAP she will need a CPAP titration study for CPAP settings.

## 2016-07-05 NOTE — Telephone Encounter (Signed)
We have forwarded the information numerous times.  I am unsure of the next step? Please help.

## 2016-07-05 NOTE — Telephone Encounter (Signed)
Pt called requesting an update on supplies that she is supposed to be receiving in regards to her sleep apnea. Please advise, thank you!  Call pt @ [phone]

## 2016-07-06 DIAGNOSIS — M25512 Pain in left shoulder: Secondary | ICD-10-CM | POA: Diagnosis not present

## 2016-07-06 DIAGNOSIS — M25511 Pain in right shoulder: Secondary | ICD-10-CM | POA: Diagnosis not present

## 2016-07-06 DIAGNOSIS — M5412 Radiculopathy, cervical region: Secondary | ICD-10-CM | POA: Diagnosis not present

## 2016-07-06 DIAGNOSIS — G8929 Other chronic pain: Secondary | ICD-10-CM | POA: Diagnosis not present

## 2016-07-06 NOTE — Telephone Encounter (Signed)
Pt needs sleep titration study  See referral placed

## 2016-07-08 ENCOUNTER — Other Ambulatory Visit: Payer: Self-pay | Admitting: Family

## 2016-07-08 DIAGNOSIS — Z0000 Encounter for general adult medical examination without abnormal findings: Secondary | ICD-10-CM

## 2016-07-15 DIAGNOSIS — R001 Bradycardia, unspecified: Secondary | ICD-10-CM | POA: Diagnosis not present

## 2016-07-15 DIAGNOSIS — R011 Cardiac murmur, unspecified: Secondary | ICD-10-CM | POA: Diagnosis not present

## 2016-07-15 DIAGNOSIS — R0602 Shortness of breath: Secondary | ICD-10-CM | POA: Diagnosis not present

## 2016-07-15 DIAGNOSIS — R5383 Other fatigue: Secondary | ICD-10-CM | POA: Diagnosis not present

## 2016-07-25 DIAGNOSIS — K760 Fatty (change of) liver, not elsewhere classified: Secondary | ICD-10-CM | POA: Diagnosis not present

## 2016-07-25 DIAGNOSIS — R748 Abnormal levels of other serum enzymes: Secondary | ICD-10-CM | POA: Diagnosis not present

## 2016-07-25 DIAGNOSIS — R768 Other specified abnormal immunological findings in serum: Secondary | ICD-10-CM | POA: Diagnosis not present

## 2016-07-26 DIAGNOSIS — K760 Fatty (change of) liver, not elsewhere classified: Secondary | ICD-10-CM | POA: Diagnosis not present

## 2016-07-26 DIAGNOSIS — R748 Abnormal levels of other serum enzymes: Secondary | ICD-10-CM | POA: Diagnosis not present

## 2016-08-08 ENCOUNTER — Ambulatory Visit: Payer: Medicare Other | Attending: Neurology

## 2016-08-08 ENCOUNTER — Encounter: Payer: Self-pay | Admitting: Family

## 2016-08-08 DIAGNOSIS — G4733 Obstructive sleep apnea (adult) (pediatric): Secondary | ICD-10-CM | POA: Insufficient documentation

## 2016-08-08 DIAGNOSIS — G2589 Other specified extrapyramidal and movement disorders: Secondary | ICD-10-CM | POA: Diagnosis not present

## 2016-08-30 DIAGNOSIS — R768 Other specified abnormal immunological findings in serum: Secondary | ICD-10-CM | POA: Insufficient documentation

## 2016-08-30 DIAGNOSIS — M7550 Bursitis of unspecified shoulder: Secondary | ICD-10-CM | POA: Insufficient documentation

## 2016-08-30 DIAGNOSIS — R945 Abnormal results of liver function studies: Secondary | ICD-10-CM | POA: Diagnosis not present

## 2016-08-30 DIAGNOSIS — K760 Fatty (change of) liver, not elsewhere classified: Secondary | ICD-10-CM | POA: Diagnosis not present

## 2016-08-30 DIAGNOSIS — M5136 Other intervertebral disc degeneration, lumbar region: Secondary | ICD-10-CM | POA: Diagnosis not present

## 2016-08-31 ENCOUNTER — Other Ambulatory Visit: Payer: Self-pay | Admitting: Family

## 2016-09-09 DIAGNOSIS — R001 Bradycardia, unspecified: Secondary | ICD-10-CM | POA: Diagnosis not present

## 2016-10-03 ENCOUNTER — Encounter: Payer: Self-pay | Admitting: Family

## 2016-10-03 ENCOUNTER — Ambulatory Visit (INDEPENDENT_AMBULATORY_CARE_PROVIDER_SITE_OTHER): Payer: Medicare Other | Admitting: Family

## 2016-10-03 VITALS — BP 177/90 | HR 72 | Temp 98.5°F | Ht 62.0 in | Wt 187.4 lb

## 2016-10-03 DIAGNOSIS — I10 Essential (primary) hypertension: Secondary | ICD-10-CM | POA: Diagnosis not present

## 2016-10-03 DIAGNOSIS — R7301 Impaired fasting glucose: Secondary | ICD-10-CM

## 2016-10-03 DIAGNOSIS — R001 Bradycardia, unspecified: Secondary | ICD-10-CM

## 2016-10-03 DIAGNOSIS — E039 Hypothyroidism, unspecified: Secondary | ICD-10-CM | POA: Diagnosis not present

## 2016-10-03 DIAGNOSIS — K219 Gastro-esophageal reflux disease without esophagitis: Secondary | ICD-10-CM

## 2016-10-03 DIAGNOSIS — E785 Hyperlipidemia, unspecified: Secondary | ICD-10-CM | POA: Diagnosis not present

## 2016-10-03 LAB — TSH: TSH: 3.79 u[IU]/mL (ref 0.35–4.50)

## 2016-10-03 LAB — LDL CHOLESTEROL, DIRECT: Direct LDL: 90 mg/dL

## 2016-10-03 LAB — LIPID PANEL
Cholesterol: 201 mg/dL — ABNORMAL HIGH (ref 0–200)
HDL: 68.3 mg/dL (ref >39.00)
NonHDL: 133.03
Total CHOL/HDL Ratio: 3
Triglycerides: 359 mg/dL — ABNORMAL HIGH (ref 0.0–149.0)
VLDL: 71.8 mg/dL — ABNORMAL HIGH (ref 0.0–40.0)

## 2016-10-03 LAB — COMPREHENSIVE METABOLIC PANEL
ALT: 47 U/L — ABNORMAL HIGH (ref 0–35)
AST: 49 U/L — ABNORMAL HIGH (ref 0–37)
Albumin: 4.4 g/dL (ref 3.5–5.2)
Alkaline Phosphatase: 65 U/L (ref 39–117)
BUN: 18 mg/dL (ref 6–23)
CO2: 28 mEq/L (ref 19–32)
Calcium: 10 mg/dL (ref 8.4–10.5)
Chloride: 103 mEq/L (ref 96–112)
Creatinine, Ser: 0.83 mg/dL (ref 0.40–1.20)
GFR: 88.15 mL/min (ref >60.00)
Glucose, Bld: 103 mg/dL — ABNORMAL HIGH (ref 70–99)
Potassium: 4.3 mEq/L (ref 3.5–5.1)
Sodium: 140 mEq/L (ref 135–145)
Total Bilirubin: 0.6 mg/dL (ref 0.2–1.2)
Total Protein: 7.6 g/dL (ref 6.0–8.3)

## 2016-10-03 LAB — HEMOGLOBIN A1C: Hgb A1c MFr Bld: 6 % (ref 4.6–6.5)

## 2016-10-03 MED ORDER — LOSARTAN POTASSIUM 100 MG PO TABS: 100.0000 mg | ORAL_TABLET | Freq: Every day | ORAL | 1 refills | Status: DC

## 2016-10-03 NOTE — Assessment & Plan Note (Signed)
Elevated today. Discontinued valsartan due to recall. We'll start losartan max dose. Patient will stay very vigilant regarding blood pressure and let me know if it does not come down today. Reassured by normal neurologic exam and absence of signs or symptoms of hypertensive urgency, or emergency at this time. Will follow.

## 2016-10-03 NOTE — Progress Notes (Signed)
Subjective:    Patient ID: Jennifer [name], female    DOB: 07-22-1950, 66 y.o.   MRN: [id number]  CC: Jennifer [name] is a 66 y.o. female who presents today for follow up.   HPI: HTN- compliant with valsartan. States eating more sodium. Denies exertional chest pain or pressure, numbness or tingling radiating to left arm or jaw, palpitations, dizziness, frequent headaches, changes in vision, or shortness of breath.  Per patient stress test was normal.    GERD-taking prilosec everyday. If misses a day, feels epigastric burning' when laying down.'   HLD- compliant with high dose medication. No side effects.  Hypothyroidism- no heart palpitations, changes in skin.   Patient seen 84/24 by cardiology for bradycardia. 100, as does not convince her symptoms related to synthetic sinus dysfunction, bradycardia. No evidence of AV node disease. Plan is for continued observation. Echo 06/2016- moderate TVR, mild LVH Stress test 07/2016-  LVEF 63%.  overall good   HISTORY:  Past Medical History:  Diagnosis Date  . High cholesterol   . Hypertension   . Hypothyroidism    Past Surgical History:  Procedure Laterality Date  . ABDOMINAL HYSTERECTOMY    . bariatric sleeve  2015  . BREAST EXCISIONAL BIOPSY Left 1998  . carpal tunnel repair    . COLONOSCOPY WITH PROPOFOL N/A 02/11/2016   Procedure: COLONOSCOPY WITH PROPOFOL;  Surgeon: Jonathon Bellows, MD;  Location: [facility] ENDOSCOPY;  Service: Endoscopy;  Laterality: N/A;  . HEMORRHOID SURGERY     Family History  Problem Relation Age of Onset  . Breast cancer Sister 38       materal 1/2 sister    Allergies: Celecoxib; Pollen extract; Nasacort [triamcinolone]; and Vicodin [hydrocodone-acetaminophen] Current Outpatient Prescriptions on File Prior to Visit  Medication Sig Dispense Refill  . acetaminophen (TYLENOL) 500 MG tablet Take 1,000 mg by mouth every 6 (six) hours as needed.     [name] Kitchen acyclovir (ZOVIRAX) 400 MG tablet Take 1 tablet (400 mg total)  by mouth daily. 90 tablet 1  . aspirin 81 MG tablet Take 81 mg by mouth daily.    . Biotin 5000 MCG CAPS Take 5,000 mcg by mouth daily.     . cholecalciferol (VITAMIN D) 1000 UNITS tablet Take 400 Units by mouth daily.     [name] Kitchen levothyroxine (SYNTHROID, LEVOTHROID) 25 MCG tablet Take 1 tablet (25 mcg total) by mouth daily before breakfast. 90 tablet 3  . Lifitegrast (XIIDRA) 5 % SOLN Apply 1 drop to eye daily as needed.     . Multiple Vitamins-Minerals (MULTIVITAMIN WITH MINERALS) tablet Take 1 tablet by mouth daily.    [name] Kitchen omeprazole (PRILOSEC) 20 MG capsule Take 1 capsule (20 mg total) by mouth daily. 90 capsule 2  . pravastatin (PRAVACHOL) 80 MG tablet Take 1 tablet (80 mg total) by mouth daily. 90 tablet 1  . sodium chloride (OCEAN) 0.65 % SOLN nasal spray Place 1 spray into both nostrils as needed for congestion.    . traZODone (DESYREL) 50 MG tablet Take 1 tablet (50 mg total) by mouth at bedtime. 90 tablet 1   No current facility-administered medications on file prior to visit.     Social History  Substance Use Topics  . Smoking status: Former Research scientist (life sciences)  . Smokeless tobacco: Never Used  . Alcohol use 8.4 oz/week    14 Glasses of wine per week    Review of Systems  Constitutional: Negative for chills and fever.  Eyes: Negative for visual disturbance.  Respiratory: Negative for cough.  Cardiovascular: Negative for chest pain and palpitations.  Gastrointestinal: Negative for nausea and vomiting.  Neurological: Negative for headaches.      Objective:    BP (!) 177/90   Pulse 72   Temp 98.5 F (36.9 C) (Oral)   Ht 5\' 2"  (1.575 m)   Wt 187 lb 6.4 oz (85 kg)   SpO2 98%   BMI 34.28 kg/m  BP Readings from Last 3 Encounters:  10/03/16 (!) 177/90  06/30/16 134/70  06/16/16 126/60   Wt Readings from Last 3 Encounters:  10/03/16 187 lb 6.4 oz (85 kg)  06/30/16 183 lb 12.8 oz (83.4 kg)  06/16/16 186 lb 3.2 oz (84.5 kg)    Physical Exam  Constitutional: She appears  well-developed and well-nourished.  HENT:  Mouth/Throat: Uvula is midline, oropharynx is clear and moist and mucous membranes are normal.  Eyes: Pupils are equal, round, and reactive to light. Conjunctivae and EOM are normal.  Fundus normal bilaterally.   Neck: No thyroid mass and no thyromegaly present.  Cardiovascular: Normal rate, regular rhythm, normal heart sounds and normal pulses.   Pulmonary/Chest: Effort normal and breath sounds normal. She has no wheezes. She has no rhonchi. She has no rales.  Lymphadenopathy:       Head (right side): No submental, no submandibular, no tonsillar, no preauricular, no posterior auricular and no occipital adenopathy present.       Head (left side): No submental, no submandibular, no tonsillar, no preauricular, no posterior auricular and no occipital adenopathy present.    She has no cervical adenopathy.  Neurological: She is alert. She has normal strength. No cranial nerve deficit or sensory deficit. She displays a negative Romberg sign.  Reflex Scores:      Bicep reflexes are 2+ on the right side and 2+ on the left side.      Patellar reflexes are 2+ on the right side and 2+ on the left side. Grip equal and strong bilateral upper extremities. Gait strong and steady. Able to perform rapid alternating movement without difficulty.   Skin: Skin is warm and dry.  Psychiatric: She has a normal mood and affect. Her speech is normal and behavior is normal. Thought content normal.  Vitals reviewed.      Assessment & Plan:   Problem List Items Addressed This Visit      Cardiovascular and Mediastinum   Essential hypertension - Primary    Elevated today. Discontinued valsartan due to recall. We'll start losartan max dose. Patient will stay very vigilant regarding blood pressure and let me know if it does not come down today. Reassured by normal neurologic exam and absence of signs or symptoms of hypertensive urgency, or emergency at this time. Will follow.       Relevant Medications   losartan (COZAAR) 100 MG tablet   Other Relevant Orders   Comprehensive metabolic panel (Completed)     Digestive   GERD (gastroesophageal reflux disease)    Chronic. Controlled on daily PPI. Discussed risk of long-term use of PPIs. Pending EGD/GI consult.       Relevant Orders   Ambulatory referral to Gastroenterology     Endocrine   Impaired fasting glucose    Pending a1c      Relevant Orders   Hemoglobin A1c (Completed)   Hypothyroidism    Clinically asymptomatic. Pending TSH      Relevant Orders   TSH (Completed)     Other   Hyperlipidemia    Stable. Pending lipid panel.  Relevant Medications   losartan (COZAAR) 100 MG tablet   Other Relevant Orders   Lipid panel (Completed)   Bradycardia    Reviewed EP consult with patient. Discussed with patient the importance of having periodic 24 Holter monitors per note to ensure bradycardia is not progressive. Patient understands this and will continue to follow with Dr. Clayborn Bigness.          I have discontinued Ms. Gobert's valsartan and valsartan. I am also having her start on losartan. Additionally, I am having her maintain her Biotin, aspirin, cholecalciferol, Lifitegrast, acetaminophen, multivitamin with minerals, sodium chloride, acyclovir, levothyroxine, traZODone, omeprazole, and pravastatin.   Meds ordered this encounter  Medications  . losartan (COZAAR) 100 MG tablet    Sig: Take 1 tablet (100 mg total) by mouth daily.    Dispense:  90 tablet    Refill:  1    Order Specific Question:   Supervising Provider    Answer:   Crecencio Mc [[id number]]    Return precautions given.   Risks, benefits, and alternatives of the medications and treatment plan prescribed today were discussed, and patient expressed understanding.   Education regarding symptom management and diagnosis given to patient on AVS.  Continue to follow with [name], FNP for routine health  maintenance.   Jennifer [name] and I agreed with plan.   [name], FNP

## 2016-10-03 NOTE — Assessment & Plan Note (Signed)
Stable. Pending lipid panel.  

## 2016-10-03 NOTE — Assessment & Plan Note (Signed)
Pending a1c. 

## 2016-10-03 NOTE — Assessment & Plan Note (Signed)
Reviewed EP consult with patient. Discussed with patient the importance of having periodic 24 Holter monitors per note to ensure bradycardia is not progressive. Patient understands this and will continue to follow with Dr. Clayborn Bigness.

## 2016-10-03 NOTE — Assessment & Plan Note (Signed)
Chronic. Controlled on daily PPI. Discussed risk of long-term use of PPIs. Pending EGD/GI consult.

## 2016-10-03 NOTE — Patient Instructions (Addendum)
Long term use beyond 3 months of proton pump inhibitors , also called PPI's, is associated with malabsorption of vitamins, chronic kidney disease, fracture risk, and diarrheal illnesses. PPI's include Nexium, Prilosec, Protonix, Dexilant, and Prevacid.   I generally recommend trying to control acid reflux with lifestyle modifications including avoiding trigger foods, not eating 2 hours prior to bedtime. You may use histamine 2 blockers daily to twice daily ( this is Zantac, Pepcid) and then when symptoms flare, start back on PPI for short course.   STOP valsartan. Start losartan.   Monitor blood pressure and let us know if comes down after you leave here,  Goal is less than 130/80; if persistently higher, please make sooner follow up appointment so we can recheck you blood pressure and manage medications  Labs today  Follow up 2 weeks

## 2016-10-03 NOTE — Assessment & Plan Note (Signed)
Clinically asymptomatic.  Pending TSH 

## 2016-10-04 ENCOUNTER — Other Ambulatory Visit: Payer: Self-pay | Admitting: Family

## 2016-10-04 DIAGNOSIS — E785 Hyperlipidemia, unspecified: Secondary | ICD-10-CM

## 2016-10-04 MED ORDER — ROSUVASTATIN CALCIUM 40 MG PO TABS: 40.0000 mg | ORAL_TABLET | Freq: Every day | ORAL | 3 refills | Status: DC

## 2016-10-07 ENCOUNTER — Telehealth: Payer: Self-pay | Admitting: *Deleted

## 2016-10-07 NOTE — Telephone Encounter (Signed)
Pt requested a call in reference to medication changes  Pt contact  [phone]

## 2016-10-07 NOTE — Progress Notes (Signed)
bp is doing well. Averaging 130s/60s

## 2016-10-07 NOTE — Telephone Encounter (Signed)
See result note.  

## 2016-10-14 ENCOUNTER — Telehealth: Payer: Self-pay | Admitting: Family

## 2016-10-14 NOTE — Telephone Encounter (Signed)
Pt called and stated that she has a cpap machine and she switched from a nasal mask to the pillow, and the pressure is too high for the pillow. Pt states that the cpap company will be sending [name] orders to decrease the pressure. Please advise, thank you!  Call pt @ [phone]

## 2016-10-14 NOTE — Telephone Encounter (Signed)
FYI. We have yet to receive them yet

## 2016-10-15 ENCOUNTER — Other Ambulatory Visit: Payer: Self-pay | Admitting: Gastroenterology

## 2016-10-17 NOTE — Telephone Encounter (Signed)
Please inform pt that we have NOT rec'ed orders yet.  Hopefully soon/  Please ask her to f/u with [name] in a week if hasn't gotten proper supplies.

## 2016-10-17 NOTE — Telephone Encounter (Signed)
Patient went back to to the nasal mask.  Patient would also like to have her setting changed to be alittle higher than six.  They were at a ten but that pressure what too much. Patient would like to try 8. Please advise.

## 2016-10-18 NOTE — Telephone Encounter (Signed)
I do not adjust settings on cipap  Please ask her what company she works with.   Does she have customer service number for them?  I can place order for titration study?

## 2016-10-20 NOTE — Telephone Encounter (Signed)
She was informed that she had to tell provider to change the Titration.

## 2016-10-24 DIAGNOSIS — Z23 Encounter for immunization: Secondary | ICD-10-CM | POA: Diagnosis not present

## 2016-10-24 NOTE — Telephone Encounter (Signed)
Melissa, Can you help here?   I do not titrate cpap machines  Should I order titration study for her?

## 2016-10-24 NOTE — Telephone Encounter (Signed)
Yes please order titration.

## 2016-11-01 DIAGNOSIS — R768 Other specified abnormal immunological findings in serum: Secondary | ICD-10-CM | POA: Diagnosis not present

## 2016-11-01 DIAGNOSIS — M5136 Other intervertebral disc degeneration, lumbar region: Secondary | ICD-10-CM | POA: Diagnosis not present

## 2016-12-12 DIAGNOSIS — R1013 Epigastric pain: Secondary | ICD-10-CM | POA: Diagnosis not present

## 2016-12-12 DIAGNOSIS — K2990 Gastroduodenitis, unspecified, without bleeding: Secondary | ICD-10-CM | POA: Diagnosis not present

## 2016-12-12 DIAGNOSIS — Z86010 Personal history of colonic polyps: Secondary | ICD-10-CM | POA: Diagnosis not present

## 2016-12-12 DIAGNOSIS — E559 Vitamin D deficiency, unspecified: Secondary | ICD-10-CM | POA: Diagnosis not present

## 2016-12-12 DIAGNOSIS — K760 Fatty (change of) liver, not elsewhere classified: Secondary | ICD-10-CM | POA: Diagnosis not present

## 2016-12-12 DIAGNOSIS — Z9884 Bariatric surgery status: Secondary | ICD-10-CM | POA: Diagnosis not present

## 2016-12-12 DIAGNOSIS — Z8619 Personal history of other infectious and parasitic diseases: Secondary | ICD-10-CM | POA: Diagnosis not present

## 2016-12-12 DIAGNOSIS — K219 Gastro-esophageal reflux disease without esophagitis: Secondary | ICD-10-CM | POA: Diagnosis not present

## 2016-12-15 ENCOUNTER — Ambulatory Visit (INDEPENDENT_AMBULATORY_CARE_PROVIDER_SITE_OTHER): Payer: Medicare Other | Admitting: Internal Medicine

## 2016-12-15 ENCOUNTER — Encounter: Payer: Self-pay | Admitting: Internal Medicine

## 2016-12-15 VITALS — BP 124/80 | HR 78 | Temp 98.7°F | Resp 14 | Ht 62.0 in | Wt 192.2 lb

## 2016-12-15 DIAGNOSIS — K649 Unspecified hemorrhoids: Secondary | ICD-10-CM | POA: Insufficient documentation

## 2016-12-15 DIAGNOSIS — R748 Abnormal levels of other serum enzymes: Secondary | ICD-10-CM

## 2016-12-15 DIAGNOSIS — Z1231 Encounter for screening mammogram for malignant neoplasm of breast: Secondary | ICD-10-CM | POA: Diagnosis not present

## 2016-12-15 DIAGNOSIS — G4733 Obstructive sleep apnea (adult) (pediatric): Secondary | ICD-10-CM | POA: Diagnosis not present

## 2016-12-15 DIAGNOSIS — K5900 Constipation, unspecified: Secondary | ICD-10-CM | POA: Diagnosis not present

## 2016-12-15 DIAGNOSIS — E781 Pure hyperglyceridemia: Secondary | ICD-10-CM

## 2016-12-15 DIAGNOSIS — K760 Fatty (change of) liver, not elsewhere classified: Secondary | ICD-10-CM | POA: Diagnosis not present

## 2016-12-15 DIAGNOSIS — E785 Hyperlipidemia, unspecified: Secondary | ICD-10-CM | POA: Diagnosis not present

## 2016-12-15 DIAGNOSIS — J302 Other seasonal allergic rhinitis: Secondary | ICD-10-CM

## 2016-12-15 DIAGNOSIS — I10 Essential (primary) hypertension: Secondary | ICD-10-CM | POA: Diagnosis not present

## 2016-12-15 DIAGNOSIS — Z1159 Encounter for screening for other viral diseases: Secondary | ICD-10-CM

## 2016-12-15 MED ORDER — ICOSAPENT ETHYL 1 G PO CAPS: ORAL_CAPSULE | ORAL | 1 refills | Status: DC

## 2016-12-15 MED ORDER — HYDROCORTISONE ACETATE 25 MG RE SUPP: 25.0000 mg | Freq: Two times a day (BID) | RECTAL | 2 refills | Status: DC | PRN

## 2016-12-15 NOTE — Patient Instructions (Signed)
Please sch appt for fasting labs in mid Feb. And f/u after  Try Colace for stool softner daily as needed. Ok to try Metamucil, increase water intake. Think about shingrix vaccine for shingles in the future  Try Vascepa for elevated triglycerides  We will refer for mammogram      Constipation, Adult Constipation is when a person:  Poops (has a bowel movement) fewer times in a week than normal.  Has a hard time pooping.  Has poop that is dry, hard, or bigger than normal.  Follow these instructions at home: Eating and drinking   Eat foods that have a lot of fiber, such as: ? Fresh fruits and vegetables. ? Whole grains. ? Beans.  Eat less of foods that are high in fat, low in fiber, or overly processed, such as: ? [place] fries. ? Hamburgers. ? Cookies. ? Candy. ? Soda.  Drink enough fluid to keep your pee (urine) clear or pale yellow. General instructions  Exercise regularly or as told by your doctor.  Go to the restroom when you feel like you need to poop. Do not hold it in.  Take over-the-counter and prescription medicines only as told by your doctor. These include any fiber supplements.  Do pelvic floor retraining exercises, such as: ? Doing deep breathing while relaxing your lower belly (abdomen). ? Relaxing your pelvic floor while pooping.  Watch your condition for any changes.  Keep all follow-up visits as told by your doctor. This is important. Contact a doctor if:  You have pain that gets worse.  You have a fever.  You have not pooped for 4 days.  You throw up (vomit).  You are not hungry.  You lose weight.  You are bleeding from the anus.  You have thin, pencil-like poop (stool). Get help right away if:  You have a fever, and your symptoms suddenly get worse.  You leak poop or have blood in your poop.  Your belly feels hard or bigger than normal (is bloated).  You have very bad belly pain.  You feel dizzy or you faint. This information  is not intended to replace advice given to you by your health care provider. Make sure you discuss any questions you have with your health care provider. Document Released: 06/22/2007 Document Revised: 07/24/2015 Document Reviewed: 06/24/2015 Elsevier Interactive Patient Education  2017 Elsevier Inc.  Fatty Liver Fatty liver, also called hepatic steatosis or steatohepatitis, is a condition in which too much fat has built up in your liver cells. The liver removes harmful substances from your bloodstream. It produces fluids your body needs. It also helps your body use and store energy from the food you eat. In many cases, fatty liver does not cause symptoms or problems. It is often diagnosed when tests are being done for other reasons. However, over time, fatty liver can cause inflammation that may lead to more serious liver problems, such as scarring of the liver (cirrhosis). What are the causes? Causes of fatty liver may include:  Drinking too much alcohol.  Poor nutrition.  Obesity.  Cushing syndrome.  Diabetes.  Hyperlipidemia.  Pregnancy.  Certain drugs.  Poisons.  Some viral infections.  What increases the risk? You may be more likely to develop fatty liver if you:  Abuse alcohol.  Are pregnant.  Are overweight.  Have diabetes.  Have hepatitis.  Have a high triglyceride level.  What are the signs or symptoms? Fatty liver often does not cause any symptoms. In cases where symptoms develop,  they can include:  Fatigue.  Weakness.  Weight loss.  Confusion.  Abdominal pain.  Yellowing of your skin and the white parts of your eyes (jaundice).  Nausea and vomiting.  How is this diagnosed? Fatty liver may be diagnosed by:  Physical exam and medical history.  Blood tests.  Imaging tests, such as an ultrasound, CT scan, or MRI.  Liver biopsy. A small sample of liver tissue is removed using a needle. The sample is then looked at under a  microscope.  How is this treated? Fatty liver is often caused by other health conditions. Treatment for fatty liver may involve medicines and lifestyle changes to manage conditions such as:  Alcoholism.  High cholesterol.  Diabetes.  Being overweight or obese.  Follow these instructions at home:  Eat a healthy diet as directed by your health care provider.  Exercise regularly. This can help you lose weight and control your cholesterol and diabetes. Talk to your health care provider about an exercise plan and which activities are best for you.  Do not drink alcohol.  Take medicines only as directed by your health care provider. Contact a health care provider if: You have difficulty controlling your:  Blood sugar.  Cholesterol.  Alcohol consumption.  Get help right away if:  You have abdominal pain.  You have jaundice.  You have nausea and vomiting. This information is not intended to replace advice given to you by your health care provider. Make sure you discuss any questions you have with your health care provider. Document Released: 02/18/2005 Document Revised: 06/11/2015 Document Reviewed: 05/15/2013 Elsevier Interactive Patient Education  [employer].

## 2016-12-15 NOTE — Progress Notes (Addendum)
Chief Complaint  Patient presents with  . cpap renewal   F/u  1. cpap renewal. She reports uses cpap qhs except recently missed 3-4 days for trip to [place] using 4.5-8.5 hours at night. She does report her eyes water and itch and She is unsure if due to machine apparatus and before she buys expensive solutions to clean machine she wants to know what she can do. She is using vinegar and water solutions and baby shampoo to clean machine but still having these problems.  She also reports she feels congested. Machine is humidified.  2. She reports h/o constipation and hemorrhoids want to know if ok to take a stool softner qd and also wants anusol suppositories 25 mg bid which helped with hemorrhoid flare 01/2016 after colonoscopy 3. Reviewed labs 10/03/16 with elevated lfts in 40s and TGS 359. She reports abnormal lfts been problem s/p bariatric surgery in 2015 She knows she has h/o fatty liver She reports she has been on increased Crestor x 2 months will repeat lipid 02/2016  4. H/o autoimmune titers elevated reviewed 04/06/16 she did f/u rheumatology who thought low probability of autoimmune d/o and f/u in 6 months Dr. Meda Coffee.     Review of Systems  Eyes:       +watery itchy eyes   Respiratory: Negative for shortness of breath.   Cardiovascular: Negative for chest pain.  Gastrointestinal: Positive for constipation.       +hemorrhoids bleed with constipation  Genitourinary:       +hemorrhoids irritated and itching  Musculoskeletal: Positive for back pain.       +chronic back pai n   Past Medical History:  Diagnosis Date  . Constipation   . Elevated liver enzymes   . Hemorrhoids   . High cholesterol   . Hyperlipidemia   . Hypertension   . Hypothyroidism    Past Surgical History:  Procedure Laterality Date  . ABDOMINAL HYSTERECTOMY     total for fibroids no h/o abnormal pap  . bariatric sleeve  2015  . BREAST EXCISIONAL BIOPSY Left 1998  . carpal tunnel repair    . COLONOSCOPY WITH  PROPOFOL N/A 02/11/2016   Procedure: COLONOSCOPY WITH PROPOFOL;  Surgeon: Jonathon Bellows, MD;  Location: [facility] ENDOSCOPY;  Service: Endoscopy;  Laterality: N/A;  . HEMORRHOID SURGERY     Family History  Problem Relation Age of Onset  . Breast cancer Sister 67       materal 1/2 sister   Social History   Socioeconomic History  . Marital status: Married    Spouse name: Not on file  . Number of children: Not on file  . Years of education: Not on file  . Highest education level: Not on file  Social Needs  . Financial resource strain: Not on file  . Food insecurity - worry: Not on file  . Food insecurity - inability: Not on file  . Transportation needs - medical: Not on file  . Transportation needs - non-medical: Not on file  Occupational History  . Not on file  Tobacco Use  . Smoking status: Former Research scientist (life sciences)  . Smokeless tobacco: Never Used  Substance and Sexual Activity  . Alcohol use: Yes    Alcohol/week: 8.4 oz    Types: 14 Glasses of wine per week  . Drug use: No  . Sexual activity: Not on file  Other Topics Concern  . Not on file  Social History Narrative   Lives in [place].    Married.  Retired 2015, Interior and spatial designer.    One son; granddaughter.    Current Meds  Medication Sig  . acetaminophen (TYLENOL) 500 MG tablet Take 1,000 mg by mouth every 6 (six) hours as needed.   [name] Kitchen acyclovir (ZOVIRAX) 400 MG tablet Take 1 tablet (400 mg total) by mouth daily.  [name] Kitchen aspirin 81 MG tablet Take 81 mg by mouth daily.  . Biotin 5000 MCG CAPS Take 5,000 mcg by mouth daily.   . cholecalciferol (VITAMIN D) 1000 UNITS tablet Take 400 Units by mouth daily.   [name] Kitchen levothyroxine (SYNTHROID, LEVOTHROID) 25 MCG tablet Take 1 tablet (25 mcg total) by mouth daily before breakfast.  . Lifitegrast (XIIDRA) 5 % SOLN Apply 1 drop to eye daily as needed.   [name] Kitchen losartan (COZAAR) 100 MG tablet Take 1 tablet (100 mg total) by mouth daily.  . metaxalone (SKELAXIN) 800 MG tablet Take by mouth.  . Multiple  Vitamin (MULTI-VITAMINS) TABS Take by mouth.  . Multiple Vitamins-Minerals (MULTIVITAMIN WITH MINERALS) tablet Take 1 tablet by mouth daily.  [name] Kitchen omeprazole (PRILOSEC) 20 MG capsule Take 1 capsule (20 mg total) by mouth daily.  . rosuvastatin (CRESTOR) 40 MG tablet Take 1 tablet (40 mg total) by mouth daily.  . sodium chloride (OCEAN) 0.65 % SOLN nasal spray Place 1 spray into both nostrils as needed for congestion.  . traZODone (DESYREL) 50 MG tablet Take 1 tablet (50 mg total) by mouth at bedtime.   Allergies  Allergen Reactions  . Celecoxib Hives  . Pollen Extract   . Nasacort [Triamcinolone] Other (See Comments)    Nasal - Nose Bleeds  . Vicodin [Hydrocodone-Acetaminophen] Nausea Only   Recent Results (from the past 2160 hour(s))  Lipid panel     Status: Abnormal   Collection Time: 10/03/16  9:42 AM  Result Value Ref Range   Cholesterol 201 (H) 0 - 200 mg/dL    Comment: ATP III Classification       Desirable:  < 200 mg/dL               Borderline High:  200 - 239 mg/dL          High:  > = 240 mg/dL   Triglycerides 359.0 (H) 0.0 - 149.0 mg/dL    Comment: Normal:  <150 mg/dLBorderline High:  150 - 199 mg/dL   HDL 68.30 >39.00 mg/dL   VLDL 71.8 (H) 0.0 - 40.0 mg/dL   Total CHOL/HDL Ratio 3     Comment:                Men          Women1/2 Average Risk     3.4          3.3Average Risk          5.0          4.42X Average Risk          9.6          7.13X Average Risk          15.0          11.0                       NonHDL 133.03     Comment: NOTE:  Non-HDL goal should be 30 mg/dL higher than patient's LDL goal (i.e. LDL goal of < 70 mg/dL, would have non-HDL goal of < 100 mg/dL)  TSH     Status: None  Collection Time: 10/03/16  9:42 AM  Result Value Ref Range   TSH 3.79 0.35 - 4.50 uIU/mL  Comprehensive metabolic panel     Status: Abnormal   Collection Time: 10/03/16  9:42 AM  Result Value Ref Range   Sodium 140 135 - 145 mEq/L   Potassium 4.3 3.5 - 5.1 mEq/L   Chloride 103 96 -  112 mEq/L   CO2 28 19 - 32 mEq/L   Glucose, Bld 103 (H) 70 - 99 mg/dL   BUN 18 6 - 23 mg/dL   Creatinine, Ser 0.83 0.40 - 1.20 mg/dL   Total Bilirubin 0.6 0.2 - 1.2 mg/dL   Alkaline Phosphatase 65 39 - 117 U/L   AST 49 (H) 0 - 37 U/L   ALT 47 (H) 0 - 35 U/L   Total Protein 7.6 6.0 - 8.3 g/dL   Albumin 4.4 3.5 - 5.2 g/dL   Calcium 10.0 8.4 - 10.5 mg/dL   GFR 88.15 >60.00 mL/min  Hemoglobin A1c     Status: None   Collection Time: 10/03/16  9:42 AM  Result Value Ref Range   Hgb A1c MFr Bld 6.0 4.6 - 6.5 %    Comment: Glycemic Control Guidelines for People with Diabetes:Non Diabetic:  <6%Goal of Therapy: <7%Additional Action Suggested:  >8%   LDL cholesterol, direct     Status: None   Collection Time: 10/03/16  9:42 AM  Result Value Ref Range   Direct LDL 90.0 mg/dL    Comment: Optimal:  <100 mg/dLNear or Above Optimal:  100-129 mg/dLBorderline High:  130-159 mg/dLHigh:  160-189 mg/dLVery High:  >190 mg/dL   Objective  Body mass index is 35.16 kg/m. Wt Readings from Last 3 Encounters:  12/15/16 192 lb 4 oz (87.2 kg)  10/03/16 187 lb 6.4 oz (85 kg)  06/30/16 183 lb 12.8 oz (83.4 kg)   Temp Readings from Last 3 Encounters:  12/15/16 98.7 F (37.1 C) (Oral)  10/03/16 98.5 F (36.9 C) (Oral)  06/30/16 98.3 F (36.8 C) (Oral)   BP Readings from Last 3 Encounters:  12/15/16 124/80  10/03/16 (!) 177/90  06/30/16 134/70   Pulse Readings from Last 3 Encounters:  12/15/16 78  10/03/16 72  06/30/16 70   Pulse oximetry on room air is 95% Physical Exam  Constitutional: She is oriented to person, place, and time and well-developed, well-nourished, and in no distress. Vital signs are normal.  HENT:  Head: Normocephalic and atraumatic.  Mouth/Throat: Oropharynx is clear and moist and mucous membranes are normal.  Eyes: Conjunctivae are normal. Pupils are equal, round, and reactive to light.  Cardiovascular: Normal rate, regular rhythm and normal heart sounds.  Pulmonary/Chest:  Effort normal and breath sounds normal.  Neurological: She is alert and oriented to person, place, and time. Gait normal. Gait normal.  Skin: Skin is warm, dry and intact.  Psychiatric: Mood, memory, affect and judgment normal.  Nursing note and vitals reviewed.  Assessment   1. OSA on cpap compliant (see HPI) 2. Constipation and hemorrhoids  3. Abnormal lfts and h/o fatty liver  4. Hypertriglyceridemia  5. H/o +ANA  6. HM Plan  1.  Continue cpap patient is benefiting from CPAP machine.  -Need to figure out what to due for eye irritation and congestion will call sleep med company  -Considered trying pataday eye drops for watery eye. She had previously been on Xiidra eye drops for dry eyes -may have her try zyrtec and eye drops qhs  2. Ok to try  stool softner qd prn. Disc fiber metamucil and increased water intake as well  Rx anusol suppositories prn   If this does not work consider Linzess for constipation   3. Repeat CMET 02/2016  4. Add vascepa 2 mg bid to statin  If too expensive will need alternative  5. Reviewed Dr. Carlyn Reichert (rheum) note and pt to f/u 04/2017  6. Had flu shot 10/2016  prevnar had will need pna 23 in 1 year if not had  Had Tdap  Zoster had. Disc shingrix in future at pharmacy   No need for pap s/p total hysterectomy for firboids no h/o abnormal paps   Referred mammo due 02/22/16   Colonoscopy had 02/11/16 neg   Get copy of bone density [facility] epic care everywhere had 2016 or may have been done at [place] neg.  Consider check hep B quant in future   Labs mid feb then f/u after  Provider: Dr. Olivia Mackie McLean-Scocuzza

## 2016-12-16 ENCOUNTER — Telehealth: Payer: Self-pay

## 2016-12-16 NOTE — Telephone Encounter (Signed)
-----   Message from Delorise Jackson, MD sent at 12/15/2016  5:29 PM EST ----- Call pt  Does she want to try nasal spray at night (is she allergic to flonase) and eye drops Pataday for itchy watery eyes.   Xiidra eye drops she had were for dry eyes  Thanks [place]

## 2016-12-16 NOTE — Telephone Encounter (Signed)
Left message to return call, ok for [facility] to  speak to patient  

## 2016-12-19 ENCOUNTER — Telehealth: Payer: Self-pay

## 2016-12-19 NOTE — Telephone Encounter (Signed)
Copied from [place]. Topic: Quick Communication - See Telephone Encounter >> Dec 16, 2016  3:14 PM Johna Sheriff, CMA wrote: CRM for notification. See Telephone encounter for: 12/16/16.  >> Dec 16, 2016  3:57 PM Patrice Paradise wrote: Patient called she yes she would like to nasal spray at night she has no allergic to flonase) and yes to the eye drops Pataday for itchy watery eyes.

## 2016-12-19 NOTE — Telephone Encounter (Signed)
FYI

## 2016-12-22 ENCOUNTER — Other Ambulatory Visit: Payer: Self-pay | Admitting: Family

## 2016-12-22 ENCOUNTER — Telehealth: Payer: Self-pay | Admitting: Family

## 2016-12-22 DIAGNOSIS — Z0000 Encounter for general adult medical examination without abnormal findings: Secondary | ICD-10-CM

## 2016-12-22 DIAGNOSIS — Z1231 Encounter for screening mammogram for malignant neoplasm of breast: Secondary | ICD-10-CM

## 2016-12-22 NOTE — Telephone Encounter (Signed)
Called patient back, states she never received call, she states she prefers to be called on cell due to she has been getting so many telemarketers calls.  She states she isn't allergic to Flonase only Nasacort .

## 2016-12-22 NOTE — Telephone Encounter (Signed)
Correction IMG [id number] sorry

## 2016-12-22 NOTE — Telephone Encounter (Signed)
Ok Flonase is OTC (over the counter) can use 1-2 sprays at night and before cpap as needed  Also does she want to try Pataday eye drops for watery/itchy eyes?  Thanks [employer]

## 2016-12-22 NOTE — Telephone Encounter (Signed)
Need new order to read 3D TOMO IMG [id number]. Thank you!

## 2016-12-23 NOTE — Telephone Encounter (Signed)
Patient advised of below and verbalized understanding.  

## 2016-12-27 DIAGNOSIS — Z8619 Personal history of other infectious and parasitic diseases: Secondary | ICD-10-CM | POA: Diagnosis not present

## 2017-01-09 ENCOUNTER — Ambulatory Visit: Payer: Medicare Other | Admitting: Internal Medicine

## 2017-01-16 ENCOUNTER — Telehealth: Payer: Self-pay | Admitting: Family

## 2017-01-16 NOTE — Telephone Encounter (Signed)
Copied from [place] [id number]. Topic: Quick Communication - See Telephone Encounter >> Jan 16, 2017  2:41 PM Vernona Rieger wrote: CRM for notification. See Telephone encounter for:   01/16/17.  Pt states she is out of town and has a sinus infection, she said she cant use her CPAP machine, she said they called her and wanted her to know why she wasn't using it. She said she left a message and explained it on there voicemail. She just doesn't want them or nor the dr to think she just isn't using it.

## 2017-01-19 NOTE — Telephone Encounter (Signed)
Patient scheduled to go to [facility] tomorrow.

## 2017-01-20 ENCOUNTER — Encounter: Payer: Self-pay | Admitting: Primary Care

## 2017-01-20 ENCOUNTER — Ambulatory Visit (INDEPENDENT_AMBULATORY_CARE_PROVIDER_SITE_OTHER): Payer: Medicare Other | Admitting: Primary Care

## 2017-01-20 VITALS — BP 146/78 | HR 68 | Temp 98.0°F | Ht 62.0 in | Wt 194.0 lb

## 2017-01-20 DIAGNOSIS — J302 Other seasonal allergic rhinitis: Secondary | ICD-10-CM | POA: Diagnosis not present

## 2017-01-20 DIAGNOSIS — J3489 Other specified disorders of nose and nasal sinuses: Secondary | ICD-10-CM

## 2017-01-20 MED ORDER — PREDNISONE 10 MG PO TABS: ORAL_TABLET | ORAL | 0 refills | Status: DC

## 2017-01-20 NOTE — Assessment & Plan Note (Signed)
Symptoms today representative, especially given odd weather changes. No evidence of bacterial involvement at this point.  Given little to no improvement with OTC treatment, will trial short, low dose course of prednisone. Also start antihistamine.  Return precautions provided.

## 2017-01-20 NOTE — Progress Notes (Signed)
   Subjective:    Patient ID: Jennifer [name], female    DOB: [birth year], 67 y.o.   MRN: [id number]  HPI  Jennifer [name] is a 67 year old female with a history of allergic rhinitis who presents today with a chief complaint of nasal congestion. She also reports mild cough, postnasal drip, sore throat, headache, fatigue. Her symptoms began one week ago while she was out of town. She's taken tylenol, cough drops, Mucinex, Flonase with some improvement. She denies fevers.   Review of Systems  Constitutional: Positive for fatigue. Negative for fever.  HENT: Positive for congestion and sinus pressure. Negative for ear pain.   Respiratory: Positive for cough. Negative for shortness of breath and wheezing.        Objective:   Physical Exam  Constitutional: She appears well-nourished. She does not appear ill.  HENT:  Right Ear: Tympanic membrane and ear canal normal.  Left Ear: Tympanic membrane and ear canal normal.  Nose: Mucosal edema present. Right sinus exhibits no maxillary sinus tenderness and no frontal sinus tenderness. Left sinus exhibits no maxillary sinus tenderness and no frontal sinus tenderness.  Mouth/Throat: Oropharynx is clear and moist.  Eyes: Conjunctivae are normal.  Neck: Neck supple.  Cardiovascular: Normal rate and regular rhythm.  Pulmonary/Chest: Effort normal and breath sounds normal. She has no wheezes. She has no rales.  Lymphadenopathy:    She has no cervical adenopathy.  Skin: Skin is warm and dry.          Assessment & Plan:

## 2017-01-20 NOTE — Patient Instructions (Signed)
Start prednisone tablets. Take three tablets for 2 days, then two tablets for 2 days, then one tablet for 2 days.  Start taking an antihistamine such as Claritin/Allegra/Zyrtec once daily. This will help to dry up your mucous/drainage.   Try gargling with salt water, using chloraseptic spray, taking Tylenol for pain.  Please call back if you develop fevers, increased pressure, increased headaches, increased cough.  It was a pleasure meeting you!

## 2017-01-24 ENCOUNTER — Telehealth: Payer: Self-pay

## 2017-01-24 NOTE — Telephone Encounter (Signed)
Stopped CPAP 1-2 days ago due to nasal congestion . Tried allergy eye drops for allergy irritation . ? bauch and loumb  She thinks that is name. She has contacted Sleep med as well, to let them know she has had problems with CPAP machine.

## 2017-01-24 NOTE — Telephone Encounter (Signed)
-----   Message from Delorise Jackson, MD sent at 01/18/2017  1:53 PM EST ----- How are her watery eyes and nasal congestion with CPAP Does she want to try allergy eye drops?  Has she tried Flonase 1-2 sprays OTC prn for nasal congestion?  Is she taking zyrtec 10 mg at night for allergies?   I have been trying to contact sleep med for months but hard to reach.  These are things I believe would help her symptoms  She can also try to call sleep med.   Thanks [employer]

## 2017-01-26 ENCOUNTER — Other Ambulatory Visit: Payer: Self-pay | Admitting: Family

## 2017-01-26 DIAGNOSIS — Z0000 Encounter for general adult medical examination without abnormal findings: Secondary | ICD-10-CM

## 2017-01-26 NOTE — Telephone Encounter (Signed)
Okay to refill Trazodone? Last ordered in [place] 12/17 by Ar[place]. Pt has seen you in office last in November & next appt with in is in February.

## 2017-02-01 ENCOUNTER — Telehealth: Payer: Self-pay | Admitting: Internal Medicine

## 2017-02-01 NOTE — Telephone Encounter (Signed)
Left voice mail to call back ok for [facility] to speak to patient 

## 2017-02-01 NOTE — Telephone Encounter (Signed)
Did she try flonase before cpap? Is her cpap humidified?  Thanks [employer]

## 2017-02-01 NOTE — Telephone Encounter (Signed)
Please advise 

## 2017-02-01 NOTE — Telephone Encounter (Signed)
Question 1- Patient was using the Flonase on and off before the CPAP machine. But since patient started using it(CPAP)- she is using the Flonase more and she is using the allergy pills and Chloraseptic throat spray as well.  Question 2- Patient states is does humidify and condensation does form in the mask and reservoir. Patient has tried to adjust it and it has not helped.  Patient is still using the CPAP so that she can keep it registered- she is struggling to use it the minimal amount of time. She can be reached at her cell #.  Patient had 5 masks to try- she is on her 3rd mask. She has got new tubing and she is switching that out tonight. She is on her second filter.  She cleans nose every day, tubing and resivior weekly. She is using distilled water and solutions recommended. Patient is unsure about the pressure- it is at 6 now. Patient feels she is struggling a little with the pressure. (Patient states it was adjusted with the pillows and she is not using them now) She thinks 8 would be better.

## 2017-02-05 NOTE — Telephone Encounter (Signed)
Does she want to try singulair for nasal allergic symptoms it is a pill she takes at night ?  Would still rec antihistamine and flonase prn If she is concerned with settings for tubing of humidification issues she needs to contact the CPAP company and someone check her machine and then the company get in contact with our office if needed   Thanks TSM

## 2017-02-06 NOTE — Telephone Encounter (Signed)
Called patient.and left VM to return call back./ [facility] may give and receive information.

## 2017-02-07 ENCOUNTER — Telehealth: Payer: Self-pay | Admitting: Internal Medicine

## 2017-02-07 NOTE — Telephone Encounter (Signed)
Please advise 

## 2017-02-07 NOTE — Telephone Encounter (Signed)
Patient states she would like to try the Singulair. That might help her decrease her usage of the Flonase and her OTC medications. Patient states she was told she would need an Rx from her provider for an adjustment to her CPAP machine. If this is not the case- please let her know.   **She is going to be out of town starting tomorrow for a week- so you may leave her a message- she will make sure she calls back when she returns.

## 2017-02-10 ENCOUNTER — Other Ambulatory Visit: Payer: Self-pay | Admitting: Internal Medicine

## 2017-02-10 DIAGNOSIS — J309 Allergic rhinitis, unspecified: Secondary | ICD-10-CM

## 2017-02-10 MED ORDER — MONTELUKAST SODIUM 10 MG PO TABS: 10.0000 mg | ORAL_TABLET | Freq: Every day | ORAL | 3 refills | Status: DC

## 2017-02-10 NOTE — Telephone Encounter (Signed)
Please call [facility] and ask what I need to do if the pressure of CPAP pt thinks is an issue and having difficulty using on current settings  1. Does she need a repeat CPAP titration study? 2. What do I need to order to get this checked out for her by CPAP company?  Fransisco Beau as pt did she get her supplies from [facility]?  Thanks [employer]

## 2017-02-15 NOTE — Telephone Encounter (Signed)
Left voice mail to call back ok for [facility] to speak to patient, patient didn't return call within 1 week to [facility]

## 2017-02-16 ENCOUNTER — Telehealth: Payer: Self-pay | Admitting: Internal Medicine

## 2017-02-16 NOTE — Telephone Encounter (Signed)
Copied from [place] [id number]. Topic: Quick Communication - See Telephone Encounter >> Feb 06, 2017  1:09 PM Dimple Nanas, RN wrote: CRM for notification. See Telephone encounter for:   02/01/17. >> Feb 16, 2017 12:42 PM [name], NT wrote: Pt. Returning call from [place] she will not be able to receive calls after 5p.m today 02-16-17. Water is collecting in the tubes of her c pap. And has questions. Pt. Can be reached at [phone]

## 2017-02-16 NOTE — Telephone Encounter (Signed)
Spoken to patient.  She stated that her tubing is collecting a lot of water.  Patient stated she is going to stop using her Cpap until she gets it looked at to make sure it is working properly.  She has also had issues with the pressures.  She will contact Cpap company Monday.

## 2017-02-20 ENCOUNTER — Other Ambulatory Visit: Payer: Self-pay | Admitting: Family

## 2017-02-20 DIAGNOSIS — I10 Essential (primary) hypertension: Secondary | ICD-10-CM | POA: Diagnosis not present

## 2017-02-20 DIAGNOSIS — E785 Hyperlipidemia, unspecified: Secondary | ICD-10-CM

## 2017-02-20 DIAGNOSIS — R05 Cough: Secondary | ICD-10-CM | POA: Diagnosis not present

## 2017-02-20 DIAGNOSIS — J019 Acute sinusitis, unspecified: Secondary | ICD-10-CM | POA: Diagnosis not present

## 2017-02-20 NOTE — Telephone Encounter (Signed)
Patient never returned called, message was addressed on 02/16/17

## 2017-02-22 ENCOUNTER — Ambulatory Visit
Admission: RE | Admit: 2017-02-22 | Discharge: 2017-02-22 | Disposition: A | Discharge status: Home / Self Care | Payer: Medicare Other | Source: Ambulatory Visit | Attending: Internal Medicine | Admitting: Internal Medicine

## 2017-02-22 DIAGNOSIS — Z1231 Encounter for screening mammogram for malignant neoplasm of breast: Secondary | ICD-10-CM | POA: Diagnosis not present

## 2017-03-02 ENCOUNTER — Other Ambulatory Visit: Payer: Self-pay | Admitting: Family

## 2017-03-03 ENCOUNTER — Other Ambulatory Visit (INDEPENDENT_AMBULATORY_CARE_PROVIDER_SITE_OTHER): Payer: Medicare Other

## 2017-03-03 DIAGNOSIS — E781 Pure hyperglyceridemia: Secondary | ICD-10-CM

## 2017-03-03 DIAGNOSIS — K760 Fatty (change of) liver, not elsewhere classified: Secondary | ICD-10-CM

## 2017-03-03 DIAGNOSIS — R748 Abnormal levels of other serum enzymes: Secondary | ICD-10-CM | POA: Diagnosis not present

## 2017-03-03 DIAGNOSIS — I10 Essential (primary) hypertension: Secondary | ICD-10-CM | POA: Diagnosis not present

## 2017-03-03 LAB — URINALYSIS, ROUTINE W REFLEX MICROSCOPIC
Bilirubin Urine: NEGATIVE
Ketones, ur: NEGATIVE
Leukocytes, UA: NEGATIVE
Nitrite: NEGATIVE
Specific Gravity, Urine: >=1.03 — AB (ref 1.000–1.030)
Total Protein, Urine: 30 — AB
Urine Glucose: NEGATIVE
Urobilinogen, UA: 0.2 (ref 0.0–1.0)
pH: 5.5 (ref 5.0–8.0)

## 2017-03-03 LAB — COMPREHENSIVE METABOLIC PANEL
ALT: 39 U/L — ABNORMAL HIGH (ref 0–35)
AST: 48 U/L — ABNORMAL HIGH (ref 0–37)
Albumin: 4.5 g/dL (ref 3.5–5.2)
Alkaline Phosphatase: 82 U/L (ref 39–117)
BUN: 22 mg/dL (ref 6–23)
CO2: 29 mEq/L (ref 19–32)
Calcium: 9.3 mg/dL (ref 8.4–10.5)
Chloride: 105 mEq/L (ref 96–112)
Creatinine, Ser: 0.93 mg/dL (ref 0.40–1.20)
GFR: 77.21 mL/min (ref >60.00)
Glucose, Bld: 107 mg/dL — ABNORMAL HIGH (ref 70–99)
Potassium: 4.6 mEq/L (ref 3.5–5.1)
Sodium: 142 mEq/L (ref 135–145)
Total Bilirubin: 0.5 mg/dL (ref 0.2–1.2)
Total Protein: 7.6 g/dL (ref 6.0–8.3)

## 2017-03-03 LAB — CBC
HCT: 41.2 % (ref 36.0–46.0)
Hemoglobin: 13.3 g/dL (ref 12.0–15.0)
MCHC: 32.3 g/dL (ref 30.0–36.0)
MCV: 87.2 fl (ref 78.0–100.0)
Platelets: 160 10*3/uL (ref 150.0–400.0)
RBC: 4.72 Mil/uL (ref 3.87–5.11)
RDW: 16.3 % — ABNORMAL HIGH (ref 11.5–15.5)
WBC: 6.9 10*3/uL (ref 4.0–10.5)

## 2017-03-03 LAB — LIPID PANEL
Cholesterol: 155 mg/dL (ref 0–200)
HDL: 60.4 mg/dL (ref >39.00)
LDL Cholesterol: 66 mg/dL (ref 0–99)
NonHDL: 94.43
Total CHOL/HDL Ratio: 3
Triglycerides: 143 mg/dL (ref 0.0–149.0)
VLDL: 28.6 mg/dL (ref 0.0–40.0)

## 2017-03-04 LAB — HEPATITIS B SURFACE ANTIBODY, QUANTITATIVE: Hepatitis B-Post: >1000 m[IU]/mL (ref >=10)

## 2017-03-07 ENCOUNTER — Ambulatory Visit (INDEPENDENT_AMBULATORY_CARE_PROVIDER_SITE_OTHER): Payer: Medicare Other | Admitting: Internal Medicine

## 2017-03-07 ENCOUNTER — Encounter: Payer: Self-pay | Admitting: Internal Medicine

## 2017-03-07 VITALS — BP 150/88 | HR 74 | Temp 98.4°F | Ht 61.0 in | Wt 195.0 lb

## 2017-03-07 DIAGNOSIS — G4733 Obstructive sleep apnea (adult) (pediatric): Secondary | ICD-10-CM

## 2017-03-07 DIAGNOSIS — K5900 Constipation, unspecified: Secondary | ICD-10-CM | POA: Diagnosis not present

## 2017-03-07 DIAGNOSIS — L989 Disorder of the skin and subcutaneous tissue, unspecified: Secondary | ICD-10-CM

## 2017-03-07 DIAGNOSIS — I10 Essential (primary) hypertension: Secondary | ICD-10-CM | POA: Diagnosis not present

## 2017-03-07 DIAGNOSIS — K760 Fatty (change of) liver, not elsewhere classified: Secondary | ICD-10-CM

## 2017-03-07 DIAGNOSIS — E785 Hyperlipidemia, unspecified: Secondary | ICD-10-CM

## 2017-03-07 DIAGNOSIS — D235 Other benign neoplasm of skin of trunk: Secondary | ICD-10-CM | POA: Diagnosis not present

## 2017-03-07 MED ORDER — HYDROCHLOROTHIAZIDE 12.5 MG PO TABS: 12.5000 mg | ORAL_TABLET | Freq: Every day | ORAL | 0 refills | Status: DC

## 2017-03-07 NOTE — Progress Notes (Signed)
Chief Complaint  Patient presents with  . Follow-up   F/u  1. HTn uncontrolled on Losartan 100 mg qd was on triamterene/hctz in the past  2. C/o left lower abdomen skin tag x 2 months getting irritated by clothes rubbing and wants removed today  3. OSA on cpap cpap adjusted for less humidity and doing better  4. Labs reviewed lfts elevated likely 2/2 fatty liver due to f/u with Dr. Mills[facility] in future     Review of Systems  Constitutional: Negative for weight loss.  HENT: Negative for hearing loss.   Eyes: Negative for blurred vision.  Respiratory: Negative for shortness of breath.   Cardiovascular: Negative for chest pain.  Gastrointestinal: Positive for constipation. Negative for abdominal pain.  Musculoskeletal: Negative for falls.  Skin:       +left lower ab skin tag   Neurological: Negative for headaches.  Psychiatric/Behavioral: Negative for memory loss.   Past Medical History:  Diagnosis Date  . Constipation   . Elevated liver enzymes   . Hemorrhoids   . High cholesterol   . Hyperlipidemia   . Hypertension   . Hypothyroidism    Past Surgical History:  Procedure Laterality Date  . ABDOMINAL HYSTERECTOMY     total for fibroids no h/o abnormal pap  . bariatric sleeve  2015  . BREAST EXCISIONAL BIOPSY Left 1998  . carpal tunnel repair    . COLONOSCOPY WITH PROPOFOL N/A 02/11/2016   Procedure: COLONOSCOPY WITH PROPOFOL;  Surgeon: Jonathon Bellows, MD;  Location: [facility] ENDOSCOPY;  Service: Endoscopy;  Laterality: N/A;  . HEMORRHOID SURGERY     Family History  Problem Relation Age of Onset  . Breast cancer Sister 64       materal 1/2 sister   Social History   Socioeconomic History  . Marital status: Married    Spouse name: Not on file  . Number of children: Not on file  . Years of education: Not on file  . Highest education level: Not on file  Social Needs  . Financial resource strain: Not on file  . Food insecurity - worry: Not on file  . Food  insecurity - inability: Not on file  . Transportation needs - medical: Not on file  . Transportation needs - non-medical: Not on file  Occupational History  . Not on file  Tobacco Use  . Smoking status: Former Research scientist (life sciences)  . Smokeless tobacco: Never Used  Substance and Sexual Activity  . Alcohol use: Yes    Alcohol/week: 8.4 oz    Types: 14 Glasses of wine per week  . Drug use: No  . Sexual activity: Not on file  Other Topics Concern  . Not on file  Social History Narrative   Lives in [place].    Married.    Retired 2015, Interior and spatial designer.    One son; granddaughter.    Current Meds  Medication Sig  . acetaminophen (TYLENOL) 500 MG tablet Take 1,000 mg by mouth every 6 (six) hours as needed.   [name] Kitchen acyclovir (ZOVIRAX) 400 MG tablet TAKE 1 TABLET (400 MG TOTAL) BY MOUTH DAILY.  [name] Kitchen aspirin 81 MG tablet Take 81 mg by mouth daily.  . Biotin 5000 MCG CAPS Take 5,000 mcg by mouth daily.   . cholecalciferol (VITAMIN D) 1000 UNITS tablet Take 400 Units by mouth daily.   . hydrocortisone (ANUSOL-HC) 25 MG suppository Place 1 suppository (25 mg total) rectally 2 (two) times daily as needed for hemorrhoids or anal itching.  [name] Kitchen  levothyroxine (SYNTHROID, LEVOTHROID) 25 MCG tablet Take 1 tablet (25 mcg total) by mouth daily before breakfast.  . Lifitegrast (XIIDRA) 5 % SOLN Apply 1 drop to eye daily as needed.   [name] Kitchen losartan (COZAAR) 100 MG tablet Take 1 tablet (100 mg total) by mouth daily.  . metaxalone (SKELAXIN) 800 MG tablet Take by mouth.  . montelukast (SINGULAIR) 10 MG tablet Take 1 tablet (10 mg total) by mouth at bedtime.  . Multiple Vitamins-Minerals (MULTIVITAMIN WITH MINERALS) tablet Take 1 tablet by mouth daily.  [name] Kitchen omeprazole (PRILOSEC) 20 MG capsule Take 1 capsule (20 mg total) by mouth daily.  . predniSONE (DELTASONE) 10 MG tablet Take three tablets for 2 days, then two tablets for 2 days, then one tablet for 2 days.  . rosuvastatin (CRESTOR) 40 MG tablet TAKE 1 TABLET BY MOUTH  EVERY DAY  . sodium chloride (OCEAN) 0.65 % SOLN nasal spray Place 1 spray into both nostrils as needed for congestion.  . traZODone (DESYREL) 50 MG tablet Take 1 tablet (50 mg total) by mouth at bedtime.  . traZODone (DESYREL) 50 MG tablet Take 1 tablet (50 mg total) by mouth at bedtime as needed for sleep.  . [DISCONTINUED] Icosapent Ethyl (VASCEPA) 1 g CAPS 2 pills 2x per day with food   Allergies  Allergen Reactions  . Celecoxib Hives  . Pollen Extract   . Nasacort [Triamcinolone] Other (See Comments)    Nasal - Nose Bleeds  . Vicodin [Hydrocodone-Acetaminophen] Nausea Only   Recent Results (from the past 2160 hour(s))  Hepatitis B surface antibody     Status: None   Collection Time: 03/03/17  8:30 AM  Result Value Ref Range   Hepatitis B-Post >1,000 > OR = 10 mIU/mL    Comment: . Patient has immunity to hepatitis B virus. . For additional information, please refer to http://education.[facility].com/faq/FAQ105 (This link is being provided for informational/ educational purposes only).   Urinalysis, Routine w reflex microscopic     Status: Abnormal   Collection Time: 03/03/17  8:30 AM  Result Value Ref Range   Color, Urine YELLOW Yellow;Lt. Yellow   APPearance CLEAR Clear   Specific Gravity, Urine >=1.030 (A) 1.000 - 1.030   pH 5.5 5.0 - 8.0   Total Protein, Urine 30 (A) Negative   Urine Glucose NEGATIVE Negative   Ketones, ur NEGATIVE Negative   Bilirubin Urine NEGATIVE Negative   Hgb urine dipstick TRACE-LYSED (A) Negative   Urobilinogen, UA 0.2 0.0 - 1.0   Leukocytes, UA NEGATIVE Negative   Nitrite NEGATIVE Negative   WBC, UA 0-2/hpf 0-2/hpf   RBC / HPF 0-2/hpf 0-2/hpf   Mucus, UA Presence of (A) None   Squamous Epithelial / LPF Few(5-10/hpf) (A) Rare(0-4/hpf)   Bacteria, UA Few(10-50/hpf) (A) None  Lipid panel     Status: None   Collection Time: 03/03/17  8:30 AM  Result Value Ref Range   Cholesterol 155 0 - 200 mg/dL    Comment: ATP III Classification        Desirable:  < 200 mg/dL               Borderline High:  200 - 239 mg/dL          High:  > = 240 mg/dL   Triglycerides 143.0 0.0 - 149.0 mg/dL    Comment: Normal:  <150 mg/dLBorderline High:  150 - 199 mg/dL   HDL 60.40 >39.00 mg/dL   VLDL 28.6 0.0 - 40.0 mg/dL   LDL Cholesterol 66 0 -  99 mg/dL   Total CHOL/HDL Ratio 3     Comment:                Men          Women1/2 Average Risk     3.4          3.3Average Risk          5.0          4.42X Average Risk          9.6          7.13X Average Risk          15.0          11.0                       NonHDL 94.43     Comment: NOTE:  Non-HDL goal should be 30 mg/dL higher than patient's LDL goal (i.e. LDL goal of < 70 mg/dL, would have non-HDL goal of < 100 mg/dL)  Comprehensive metabolic panel     Status: Abnormal   Collection Time: 03/03/17  8:30 AM  Result Value Ref Range   Sodium 142 135 - 145 mEq/L   Potassium 4.6 3.5 - 5.1 mEq/L   Chloride 105 96 - 112 mEq/L   CO2 29 19 - 32 mEq/L   Glucose, Bld 107 (H) 70 - 99 mg/dL   BUN 22 6 - 23 mg/dL   Creatinine, Ser 0.93 0.40 - 1.20 mg/dL   Total Bilirubin 0.5 0.2 - 1.2 mg/dL   Alkaline Phosphatase 82 39 - 117 U/L   AST 48 (H) 0 - 37 U/L   ALT 39 (H) 0 - 35 U/L   Total Protein 7.6 6.0 - 8.3 g/dL   Albumin 4.5 3.5 - 5.2 g/dL   Calcium 9.3 8.4 - 10.5 mg/dL   GFR 77.21 >60.00 mL/min  CBC     Status: Abnormal   Collection Time: 03/03/17  8:30 AM  Result Value Ref Range   WBC 6.9 4.0 - 10.5 K/uL   RBC 4.72 3.87 - 5.11 Mil/uL   Platelets 160.0 150.0 - 400.0 K/uL   Hemoglobin 13.3 12.0 - 15.0 g/dL   HCT 41.2 36.0 - 46.0 %   MCV 87.2 78.0 - 100.0 fl   MCHC 32.3 30.0 - 36.0 g/dL   RDW 16.3 (H) 11.5 - 15.5 %   Objective  Body mass index is 36.84 kg/m. Wt Readings from Last 3 Encounters:  03/07/17 195 lb (88.5 kg)  01/20/17 194 lb (88 kg)  12/15/16 192 lb 4 oz (87.2 kg)   Temp Readings from Last 3 Encounters:  03/07/17 98.4 F (36.9 C) (Oral)  01/20/17 98 F (36.7 C) (Oral)  12/15/16  98.7 F (37.1 C) (Oral)   BP Readings from Last 3 Encounters:  03/07/17 (!) 150/88  01/20/17 (!) 146/78  12/15/16 124/80   Pulse Readings from Last 3 Encounters:  03/07/17 74  01/20/17 68  12/15/16 78   O2 sat room air 98%   Physical Exam  Constitutional: She is oriented to person, place, and time and well-developed, well-nourished, and in no distress.  HENT:  Head: Normocephalic and atraumatic.  Mouth/Throat: Oropharynx is clear and moist and mucous membranes are normal.  Eyes: Conjunctivae are normal. Pupils are equal, round, and reactive to light.  Cardiovascular: Normal rate, regular rhythm and normal heart sounds.  Pulmonary/Chest: Effort normal and breath sounds normal.  Abdominal:    Neurological: She  is alert and oriented to person, place, and time. Gait normal.  Skin: Skin is warm and dry. Lesion noted.     Psychiatric: Mood, memory, affect and judgment normal.  Nursing note and vitals reviewed.   Assessment   1. HTN/HLD improved  2. Left lower abdomen skin tag vs other 0.6 m  3. OSA on cpap  4. Elevated lfts, fatty liver  5. Constipation  6.HM Plan  1. Cont losartan 100 add hctz 12.5 mg qd  Cont Crestor given good Rx card today  F/u in 3 months with PCP  2.  Cleaned with betadine x 2 today and removed with tweezers and dermablade. Pressure dressing applied and consent signed  3. Cont cpap  4. F/u GI  5. Increase water and fiber if this does not help consider Linzess  6. utd flu, prevnar pna 23 due 04/06/17  Had Tdap and zostervax   Mamm neg 02/22/17 No pap  Colonoscopy had 02/11/16 with tubular and hyperplastic polyps Need to get copy of DEXA had 02/13/14 signed release today  Smoker fro age 19 to 51s 1-1.5 ppd no FH lung cancer calc CT chest risk score calc and not eligible CT chest lung cancer skin    Provider: Dr. Olivia Mackie McLean-Scocuzza-Internal Medicine

## 2017-03-07 NOTE — Progress Notes (Signed)
Pre visit review using our clinic review tool, if applicable. No additional management support is needed unless otherwise documented below in the visit note. 

## 2017-03-07 NOTE — Patient Instructions (Addendum)
Please start hydrochlorothiazdie 12.5 mg in am for blood pressure  Increase your fiber intake for constipation. Also try warm prune juice/ Miralax=laxative if needed  F/u with PCP in 3 months     Ref. Range 03/03/2017 08:30  Cholesterol Latest Ref Range: 0 - 200 mg/dL 155  HDL Cholesterol Latest Ref Range: >39.00 mg/dL 60.40  LDL (calc) Latest Ref Range: 0 - 99 mg/dL 66  NonHDL Unknown 94.43  Triglycerides Latest Ref Range: 0.0 - 149.0 mg/dL 143.0  VLDL Latest Ref Range: 0.0 - 40.0 mg/dL 28.6   Results for Jennifer [name], Jennifer [name] (MRN [id number]) as of 03/07/2017 08:49  Ref. Range 10/03/2016 09:42 03/03/2017 08:30  Sodium Latest Ref Range: 135 - 145 mEq/L 140 142  Potassium Latest Ref Range: 3.5 - 5.1 mEq/L 4.3 4.6  Chloride Latest Ref Range: 96 - 112 mEq/L 103 105  CO2 Latest Ref Range: 19 - 32 mEq/L 28 29  Glucose Latest Ref Range: 70 - 99 mg/dL 103 (H) 107 (H)  BUN Latest Ref Range: 6 - 23 mg/dL 18 22  Creatinine Latest Ref Range: 0.40 - 1.20 mg/dL 0.83 0.93  Calcium Latest Ref Range: 8.4 - 10.5 mg/dL 10.0 9.3  Alkaline Phosphatase Latest Ref Range: 39 - 117 U/L 65 82  Albumin Latest Ref Range: 3.5 - 5.2 g/dL 4.4 4.5  AST Latest Ref Range: 0 - 37 U/L 49 (H) 48 (H)  ALT Latest Ref Range: 0 - 35 U/L 47 (H) 39 (H)  Total Protein Latest Ref Range: 6.0 - 8.3 g/dL 7.6 7.6  Total Bilirubin Latest Ref Range: 0.2 - 1.2 mg/dL 0.6 0.5  GFR Latest Ref Range: >60.00 mL/min 88.15 77.21    Results for Jennifer [name], Jennifer [name] (MRN [id number]) as of 03/07/2017 08:49  Ref. Range 03/03/2017 08:30  WBC Latest Ref Range: 4.0 - 10.5 K/uL 6.9  RBC Latest Ref Range: 3.87 - 5.11 Mil/uL 4.72  Hemoglobin Latest Ref Range: 12.0 - 15.0 g/dL 13.3  HCT Latest Ref Range: 36.0 - 46.0 % 41.2  MCV Latest Ref Range: 78.0 - 100.0 fl 87.2  MCHC Latest Ref Range: 30.0 - 36.0 g/dL 32.3  RDW Latest Ref Range: 11.5 - 15.5 % 16.3 (H)  Platelets Latest Ref Range: 150.0 - 400.0 K/uL 160.0   Constipation, Adult Constipation is when  a person has fewer bowel movements in a week than normal, has difficulty having a bowel movement, or has stools that are dry, hard, or larger than normal. Constipation may be caused by an underlying condition. It may become worse with age if a person takes certain medicines and does not take in enough fluids. Follow these instructions at home: Eating and drinking   Eat foods that have a lot of fiber, such as fresh fruits and vegetables, whole grains, and beans.  Limit foods that are high in fat, low in fiber, or overly processed, such as french fries, hamburgers, cookies, candies, and soda.  Drink enough fluid to keep your urine clear or pale yellow. General instructions  Exercise regularly or as told by your health care provider.  Go to the restroom when you have the urge to go. Do not hold it in.  Take over-the-counter and prescription medicines only as told by your health care provider. These include any fiber supplements.  Practice pelvic floor retraining exercises, such as deep breathing while relaxing the lower abdomen and pelvic floor relaxation during bowel movements.  Watch your condition for any changes.  Keep all follow-up visits as told by your health care provider.  This is important. Contact a health care provider if:  You have pain that gets worse.  You have a fever.  You do not have a bowel movement after 4 days.  You vomit.  You are not hungry.  You lose weight.  You are bleeding from the anus.  You have thin, pencil-like stools. Get help right away if:  You have a fever and your symptoms suddenly get worse.  You leak stool or have blood in your stool.  Your abdomen is bloated.  You have severe pain in your abdomen.  You feel dizzy or you faint. This information is not intended to replace advice given to you by your health care provider. Make sure you discuss any questions you have with your health care provider. Document Released: 10/02/2003  Document Revised: 07/24/2015 Document Reviewed: 06/24/2015 Elsevier Interactive Patient Education  2018 [employer].   Hypertension Hypertension, commonly called high blood pressure, is when the force of blood pumping through the arteries is too strong. The arteries are the blood vessels that carry blood from the heart throughout the body. Hypertension forces the heart to work harder to pump blood and may cause arteries to become narrow or stiff. Having untreated or uncontrolled hypertension can cause heart attacks, strokes, kidney disease, and other problems. A blood pressure reading consists of a higher number over a lower number. Ideally, your blood pressure should be below 120/80. The first ("top") number is called the systolic pressure. It is a measure of the pressure in your arteries as your heart beats. The second ("bottom") number is called the diastolic pressure. It is a measure of the pressure in your arteries as the heart relaxes. What are the causes? The cause of this condition is not known. What increases the risk? Some risk factors for high blood pressure are under your control. Others are not. Factors you can change  Smoking.  Having type 2 diabetes mellitus, high cholesterol, or both.  Not getting enough exercise or physical activity.  Being overweight.  Having too much fat, sugar, calories, or salt (sodium) in your diet.  Drinking too much alcohol. Factors that are difficult or impossible to change  Having chronic kidney disease.  Having a family history of high blood pressure.  Age. Risk increases with age.  Race. You may be at higher risk if you are African-American.  Gender. Men are at higher risk than women before age 34. After age 85, women are at higher risk than men.  Having obstructive sleep apnea.  Stress. What are the signs or symptoms? Extremely high blood pressure (hypertensive crisis) may cause:  Headache.  Anxiety.  Shortness of  breath.  Nosebleed.  Nausea and vomiting.  Severe chest pain.  Jerky movements you cannot control (seizures).  How is this diagnosed? This condition is diagnosed by measuring your blood pressure while you are seated, with your arm resting on a surface. The cuff of the blood pressure monitor will be placed directly against the skin of your upper arm at the level of your heart. It should be measured at least twice using the same arm. Certain conditions can cause a difference in blood pressure between your right and left arms. Certain factors can cause blood pressure readings to be lower or higher than normal (elevated) for a short period of time:  When your blood pressure is higher when you are in a health care provider's office than when you are at home, this is called white coat hypertension. Most people with this  condition do not need medicines.  When your blood pressure is higher at home than when you are in a health care provider's office, this is called masked hypertension. Most people with this condition may need medicines to control blood pressure.  If you have a high blood pressure reading during one visit or you have normal blood pressure with other risk factors:  You may be asked to return on a different day to have your blood pressure checked again.  You may be asked to monitor your blood pressure at home for 1 week or longer.  If you are diagnosed with hypertension, you may have other blood or imaging tests to help your health care provider understand your overall risk for other conditions. How is this treated? This condition is treated by making healthy lifestyle changes, such as eating healthy foods, exercising more, and reducing your alcohol intake. Your health care provider may prescribe medicine if lifestyle changes are not enough to get your blood pressure under control, and if:  Your systolic blood pressure is above 130.  Your diastolic blood pressure is above  80.  Your personal target blood pressure may vary depending on your medical conditions, your age, and other factors. Follow these instructions at home: Eating and drinking  Eat a diet that is high in fiber and potassium, and low in sodium, added sugar, and fat. An example eating plan is called the DASH (Dietary Approaches to Stop Hypertension) diet. To eat this way: ? Eat plenty of fresh fruits and vegetables. Try to fill half of your plate at each meal with fruits and vegetables. ? Eat whole grains, such as whole wheat pasta, brown rice, or whole grain bread. Fill about one quarter of your plate with whole grains. ? Eat or drink low-fat dairy products, such as skim milk or low-fat yogurt. ? Avoid fatty cuts of meat, processed or cured meats, and poultry with skin. Fill about one quarter of your plate with lean proteins, such as fish, chicken without skin, beans, eggs, and tofu. ? Avoid premade and processed foods. These tend to be higher in sodium, added sugar, and fat.  Reduce your daily sodium intake. Most people with hypertension should eat less than 1,500 mg of sodium a day.  Limit alcohol intake to no more than 1 drink a day for nonpregnant women and 2 drinks a day for men. One drink equals 12 oz of beer, 5 oz of wine, or 1 oz of hard liquor. Lifestyle  Work with your health care provider to maintain a healthy body weight or to lose weight. Ask what an ideal weight is for you.  Get at least 30 minutes of exercise that causes your heart to beat faster (aerobic exercise) most days of the week. Activities may include walking, swimming, or biking.  Include exercise to strengthen your muscles (resistance exercise), such as pilates or lifting weights, as part of your weekly exercise routine. Try to do these types of exercises for 30 minutes at least 3 days a week.  Do not use any products that contain nicotine or tobacco, such as cigarettes and e-cigarettes. If you need help quitting, ask  your health care provider.  Monitor your blood pressure at home as told by your health care provider.  Keep all follow-up visits as told by your health care provider. This is important. Medicines  Take over-the-counter and prescription medicines only as told by your health care provider. Follow directions carefully. Blood pressure medicines must be taken as prescribed.  Do not skip doses of blood pressure medicine. Doing this puts you at risk for problems and can make the medicine less effective.  Ask your health care provider about side effects or reactions to medicines that you should watch for. Contact a health care provider if:  You think you are having a reaction to a medicine you are taking.  You have headaches that keep coming back (recurring).  You feel dizzy.  You have swelling in your ankles.  You have trouble with your vision. Get help right away if:  You develop a severe headache or confusion.  You have unusual weakness or numbness.  You feel faint.  You have severe pain in your chest or abdomen.  You vomit repeatedly.  You have trouble breathing. Summary  Hypertension is when the force of blood pumping through your arteries is too strong. If this condition is not controlled, it may put you at risk for serious complications.  Your personal target blood pressure may vary depending on your medical conditions, your age, and other factors. For most people, a normal blood pressure is less than 120/80.  Hypertension is treated with lifestyle changes, medicines, or a combination of both. Lifestyle changes include weight loss, eating a healthy, low-sodium diet, exercising more, and limiting alcohol. This information is not intended to replace advice given to you by your health care provider. Make sure you discuss any questions you have with your health care provider. Document Released: 01/03/2005 Document Revised: 12/02/2015 Document Reviewed: 12/02/2015 Elsevier  Interactive Patient Education  2018 [place], Adult Taking care of your wound properly can help to prevent pain and infection. It can also help your wound to heal more quickly. How is this treated? Wound care  Follow instructions from your health care provider about how to take care of your wound. Make sure you: ? Wash your hands with soap and water before you change the bandage (dressing). If soap and water are not available, use hand sanitizer. ? Change your dressing as told by your health care provider. ? Leave stitches (sutures), skin glue, or adhesive strips in place. These skin closures may need to stay in place for 2 weeks or longer. If adhesive strip edges start to loosen and curl up, you may trim the loose edges. Do not remove adhesive strips completely unless your health care provider tells you to do that.  Check your wound area every day for signs of infection. Check for: ? More redness, swelling, or pain. ? More fluid or blood. ? Warmth. ? Pus or a bad smell.  Ask your health care provider if you should clean the wound with mild soap and water. Doing this may include: ? Using a clean towel to pat the wound dry after cleaning it. Do not rub or scrub the wound. ? Applying a cream or ointment. Do this only as told by your health care provider. ? Covering the incision with a clean dressing.  Ask your health care provider when you can leave the wound uncovered. Medicines   If you were prescribed an antibiotic medicine, cream, or ointment, take or use the antibiotic as told by your health care provider. Do not stop taking or using the antibiotic even if your condition improves.  Take over-the-counter and prescription medicines only as told by your health care provider. If you were prescribed pain medicine, take it at least 30 minutes before doing any wound care or as told by your health care provider. General  instructions  Return to your normal activities  as told by your health care provider. Ask your health care provider what activities are safe.  Do not scratch or pick at the wound.  Keep all follow-up visits as told by your health care provider. This is important.  Eat a diet that includes protein, vitamin A, vitamin C, and other nutrient-rich foods. These help the wound heal: ? Protein-rich foods include meat, dairy, beans, nuts, and other sources. ? Vitamin A-rich foods include carrots and dark green, leafy vegetables. ? Vitamin C-rich foods include citrus, tomatoes, and other fruits and vegetables. ? Nutrient-rich foods have protein, carbohydrates, fat, vitamins, or minerals. Eat a variety of healthy foods including vegetables, fruits, and whole grains. Contact a health care provider if:  You received a tetanus shot and you have swelling, severe pain, redness, or bleeding at the injection site.  Your pain is not controlled with medicine.  You have more redness, swelling, or pain around the wound.  You have more fluid or blood coming from the wound.  Your wound feels warm to the touch.  You have pus or a bad smell coming from the wound.  You have a fever or chills.  You are nauseous or you vomit.  You are dizzy. Get help right away if:  You have a red streak going away from your wound.  The edges of the wound open up and separate.  Your wound is bleeding and the bleeding does not stop with gentle pressure.  You have a rash.  You faint.  You have trouble breathing. This information is not intended to replace advice given to you by your health care provider. Make sure you discuss any questions you have with your health care provider. Document Released: 10/13/2007 Document Revised: 09/02/2015 Document Reviewed: 07/21/2015 Elsevier Interactive Patient Education  2017 [employer].

## 2017-03-21 ENCOUNTER — Telehealth: Payer: Self-pay | Admitting: Family

## 2017-03-21 NOTE — Telephone Encounter (Signed)
Mail pt  I reviewing your chart, it appears that you are due for CT of the chest for a lung cancer screen. It is annual test.  From an insurance perspective, as long as you meet the following criteria, it is paid for since it is preventative care.    Criteria for low dose lung cancer screening scan:  Age between 55-77   Current smoker or former if quit within the last 15 years  History of smoking at least 1 pack a day for 30 years or that equivalent. (2 packs a day for 15 years,  pack a day for 60 years)  I had ordered in the past for you.   If it is a screen that you a still interested in, please call the office or send me a mychart message to let me know so that I can order for you.   Hope you are well.  Best,  [name], NP

## 2017-03-22 ENCOUNTER — Ambulatory Visit: Payer: Self-pay | Admitting: *Deleted

## 2017-03-22 NOTE — Telephone Encounter (Signed)
Pt states she took her BP at approximately 4:15pm today and noted it to be 94/60 and pulse 109. Pt also experiencing some dizziness at that time and stated that when she bent over she felt woozy. Pt states she also had some numbness to the right side of her face when her BP dropped.Pt asked to take BP while she was on the phone and the BP was 112/64 and P-106. According to pt BP readings from the past couple of days were: 2/27-139/82, 3/4-131/77, 3/6 at 10am-129/75. Pt states she was seen by Dr. Terese Door and was started on HCTZ during an OV on 2/19. Pt states she was on this medication previously when prescribed by Truddie Crumble and was taken off of the medication because she had issues with her BPs at that time as well. Pt states she did take all her medications for BP this am.Pt advised that she needed to be seen in the ED or Urgent Care to treat current symptom of dizziness. Pt wanted to wait to see how she felt later before going to the ED due to her BP now being 112/64. Pt advised again that she needed to be seen within the next 4 hours and she states she will go if she does not feel better. Pt's husband is also in the home with her at this time.Pt asking about making appt with Margrett Arnett or Dr. Terese Door to have her medications adjusted but no availability within the next couple of days. Advised pt that office will be contacted due to no appt availability to see if she could be worked in for an appt to be seen sooner. Notified pt that information provided will be discussed with Joycelyn Schmid Arnett,NP Pt can be contacted at [phone].  Reason for Disposition . [6] Systolic BP 22-297 AND [9] taking blood pressure medications AND [3] dizzy, lightheaded or weak  Answer Assessment - Initial Assessment Questions 1. BLOOD PRESSURE: "What is the blood pressure?" "Did you take at least two measurements 5 minutes apart?"     At 4:15 BP was 94/60 2. ONSET: "When did you take your blood  pressure?"    At 4:15p 3. HOW: "How did you obtain the blood pressure?" (e.g., visiting nurse, automatic home BP monitor)     Automatic home BP monitor 4. HISTORY: "Do you have a history of low blood pressure?" "What is your blood pressure normally?"     No, previous BPs over the last couple of days ranged from 120-130 5. MEDICATIONS: "Are you taking any medications for blood pressure?" If yes: "Have they been changed recently?"     Yes pt is takign  6. PULSE RATE: "Do you know what your pulse rate is?"     109 7. OTHER SYMPTOMS: "Have you been sick recently?" "Have you had a recent injury?"     No 8. PREGNANCY: "Is there any chance you are pregnant?" "When was your last menstrual period?"     n/a  Protocols used: LOW BLOOD PRESSURE-A-AH

## 2017-03-23 NOTE — Telephone Encounter (Signed)
Patient notified and voiced understanding will keep appointment on Monday if symptoms change she will go to [facility] or ED.

## 2017-03-23 NOTE — Telephone Encounter (Signed)
called patient this morning she says she feels better this morning she is holding the HCTZ this morning and BP 117/70,patient BP on 03/22/17 94/60 pulse 109 after taking HCTZ felt dizzy bending over with numbness. Patient stated the last time HCTZ was added to her regimen she had low pressure readings and she will continue to hold until she hears from PCP. Scheduled patient for Monday 03/27/17. Patient stated she would go to UC or ER if symptom's return without the HCTZ,

## 2017-03-23 NOTE — Telephone Encounter (Signed)
Do not resume the hctz  . Keep the appt on march 11

## 2017-03-26 ENCOUNTER — Other Ambulatory Visit: Payer: Self-pay | Admitting: Family

## 2017-03-26 DIAGNOSIS — I10 Essential (primary) hypertension: Secondary | ICD-10-CM

## 2017-03-27 ENCOUNTER — Encounter: Payer: Self-pay | Admitting: Family

## 2017-03-27 ENCOUNTER — Telehealth: Payer: Self-pay | Admitting: Family

## 2017-03-27 ENCOUNTER — Ambulatory Visit (INDEPENDENT_AMBULATORY_CARE_PROVIDER_SITE_OTHER): Payer: Medicare Other | Admitting: Family

## 2017-03-27 VITALS — BP 108/58 | HR 84 | Temp 98.9°F | Resp 15 | Wt 197.5 lb

## 2017-03-27 DIAGNOSIS — K649 Unspecified hemorrhoids: Secondary | ICD-10-CM | POA: Diagnosis not present

## 2017-03-27 DIAGNOSIS — I10 Essential (primary) hypertension: Secondary | ICD-10-CM | POA: Diagnosis not present

## 2017-03-27 DIAGNOSIS — K219 Gastro-esophageal reflux disease without esophagitis: Secondary | ICD-10-CM

## 2017-03-27 DIAGNOSIS — R079 Chest pain, unspecified: Secondary | ICD-10-CM | POA: Diagnosis not present

## 2017-03-27 MED ORDER — HYDROCORTISONE ACETATE 25 MG RE SUPP: 25.0000 mg | Freq: Two times a day (BID) | RECTAL | 2 refills | Status: DC | PRN

## 2017-03-27 MED ORDER — LOSARTAN POTASSIUM 50 MG PO TABS: 50.0000 mg | ORAL_TABLET | Freq: Every day | ORAL | 3 refills | Status: DC

## 2017-03-27 NOTE — Telephone Encounter (Signed)
Copied from [place] [id number]. Topic: General - Other >> Mar 27, 2017 12:36 PM Synthia Innocent wrote: Reason for CRM: Wanted to let [name] that she is scheduled with Cardiologist at [facility], Dr Clayborn Bigness for 04/10/17 @ 10am

## 2017-03-27 NOTE — Progress Notes (Signed)
Subjective:    Patient ID: Jennifer [name], female    DOB: [birth year], 67 y.o.   MRN: [id number]  CC: Jennifer [name] is a 67 y.o. female who presents today for follow up.   HPI: Hypotension- 90/61, 117/75, endorses feeling lightheaded however improved. Denies numbness or tingling radiating to left arm or jaw, palpitations, syncope, frequent headaches, changes in vision, or shortness of breath.   'Dull' pain on left side chest wall. Notices when resting, laying down in morning or night. Noticed one month ago, lasts one hour, resolves on its own. Not sure if meal related. May be soreness from starting to work out a again. Notes left shoulder pain which radiates to rib area.   Stopped HCTZ and BP had 'come back up.'  Not as dizzy. Feels a little tired.   OSA- compliant with cipap, using for 4 hrs each night. Has sinus drainage right now which makes it hard. Uses saline spray, zyrtec. Not sure   GERD- symptoms controlled on omeprazole; sees GI 04/2017.   Started with a Physiological scientist one month ago.       GI 11/2016  Last EGD 2014 colonocopy 01/2016; due in 5 years   06/2016 [facility]-bradycardia, holter monitor. Stress test normal 07/2016. Echo showed mild LVH. EF 55%.   Per patient, saw EP. Unable to see results  HISTORY:  Past Medical History:  Diagnosis Date  . Constipation   . Elevated liver enzymes   . Hemorrhoids   . High cholesterol   . Hyperlipidemia   . Hypertension   . Hypothyroidism    Past Surgical History:  Procedure Laterality Date  . ABDOMINAL HYSTERECTOMY     total for fibroids no h/o abnormal pap  . bariatric sleeve  2015  . BREAST EXCISIONAL BIOPSY Left 1998  . carpal tunnel repair    . COLONOSCOPY WITH PROPOFOL N/A 02/11/2016   Procedure: COLONOSCOPY WITH PROPOFOL;  Surgeon: Jonathon Bellows, MD;  Location: [facility] ENDOSCOPY;  Service: Endoscopy;  Laterality: N/A;  . HEMORRHOID SURGERY     Family History  Problem Relation Age of Onset  . Breast cancer  Sister 73       materal 1/2 sister    Allergies: Celecoxib; Pollen extract; Nasacort [triamcinolone]; and Vicodin [hydrocodone-acetaminophen] Current Outpatient Medications on File Prior to Visit  Medication Sig Dispense Refill  . acetaminophen (TYLENOL) 500 MG tablet Take 1,000 mg by mouth every 6 (six) hours as needed.     [name] Kitchen acyclovir (ZOVIRAX) 400 MG tablet TAKE 1 TABLET (400 MG TOTAL) BY MOUTH DAILY. 90 tablet 0  . aspirin 81 MG tablet Take 81 mg by mouth daily.    . Biotin 5000 MCG CAPS Take 5,000 mcg by mouth daily.     . cholecalciferol (VITAMIN D) 1000 UNITS tablet Take 400 Units by mouth daily.     Vanessa Kick Ethyl (VASCEPA) 1 g CAPS Take by mouth.    . levothyroxine (SYNTHROID, LEVOTHROID) 25 MCG tablet Take 1 tablet (25 mcg total) by mouth daily before breakfast. 90 tablet 3  . Lifitegrast (XIIDRA) 5 % SOLN Apply 1 drop to eye daily as needed.     [name] Kitchen losartan (COZAAR) 100 MG tablet Take 1 tablet (100 mg total) by mouth daily. 90 tablet 1  . metaxalone (SKELAXIN) 800 MG tablet Take by mouth.    . montelukast (SINGULAIR) 10 MG tablet Take 1 tablet (10 mg total) by mouth at bedtime. 90 tablet 3  . Multiple Vitamins-Minerals (MULTIVITAMIN WITH MINERALS) tablet Take 1 tablet  by mouth daily.    [name] Kitchen omeprazole (PRILOSEC) 20 MG capsule TAKE ONE CAPSULE BY MOUTH EVERY DAY 90 capsule 1  . rosuvastatin (CRESTOR) 40 MG tablet TAKE 1 TABLET BY MOUTH EVERY DAY 90 tablet 0  . sodium chloride (OCEAN) 0.65 % SOLN nasal spray Place 1 spray into both nostrils as needed for congestion.    . traZODone (DESYREL) 50 MG tablet Take 1 tablet (50 mg total) by mouth at bedtime. 90 tablet 1  . traZODone (DESYREL) 50 MG tablet Take 1 tablet (50 mg total) by mouth at bedtime as needed for sleep. 90 tablet 1  . hydrochlorothiazide (HYDRODIURIL) 12.5 MG tablet Take 1 tablet (12.5 mg total) by mouth daily. (Patient not taking: Reported on 03/27/2017) 90 tablet 0   No current facility-administered medications on  file prior to visit.     Social History   Tobacco Use  . Smoking status: Former Research scientist (life sciences)  . Smokeless tobacco: Never Used  Substance Use Topics  . Alcohol use: Yes    Alcohol/week: 8.4 oz    Types: 14 Glasses of wine per week  . Drug use: No    Review of Systems  Constitutional: Negative for chills and fever.  Respiratory: Negative for cough, shortness of breath and wheezing.   Cardiovascular: Positive for chest pain. Negative for palpitations and leg swelling.  Gastrointestinal: Negative for nausea and vomiting.  Musculoskeletal: Negative for back pain.      Objective:    BP (!) 108/58 (BP Location: Left Arm, Patient Position: Standing, Cuff Size: Large)   Pulse 84   Temp 98.9 F (37.2 C) (Oral)   Resp 15   Wt 197 lb 8 oz (89.6 kg)   SpO2 96%   BMI 37.32 kg/m  BP Readings from Last 3 Encounters:  03/27/17 (!) 108/58  03/07/17 (!) 150/88  01/20/17 (!) 146/78   Wt Readings from Last 3 Encounters:  03/27/17 197 lb 8 oz (89.6 kg)  03/07/17 195 lb (88.5 kg)  01/20/17 194 lb (88 kg)    Physical Exam  Constitutional: She appears well-developed and well-nourished.  Eyes: Conjunctivae are normal.  Cardiovascular: Normal rate, regular rhythm, normal heart sounds and normal pulses.  Pulmonary/Chest: Effort normal and breath sounds normal. She has no wheezes. She has no rhonchi. She has no rales. She exhibits no tenderness.  Musculoskeletal:       Left shoulder: She exhibits normal range of motion, no tenderness, no bony tenderness, no deformity, no laceration and no pain.  Left Shoulder:   No asymmetry of shoulders when comparing right and left.No pain with palpation over glenohumeral joint lines, [place] joint, AC joint, or bicipital groove. No pain with internal and external rotation. No pain with resisted lateral extension .   Negative active painful arc sign. Negative passive arc ( Neer's). Negative drop arm.   Neurological: She is alert.  Skin: Skin is warm and dry.    Psychiatric: She has a normal mood and affect. Her speech is normal and behavior is normal. Thought content normal.  Vitals reviewed.      Assessment & Plan:   Problem List Items Addressed This Visit      Cardiovascular and Mediastinum   Essential hypertension - Primary    At goal. She is not on HCTZ today and dizziness improved. Not orthostatic ( see flow sheet). We discussed trial of losartan 50mg  ( had been on 100mg  ) since she is still symptomatic, though improved.  Reassured by EKG today- no acute ischemia. When  compared to prior 05/2016 and 08/2014, no significant changes. Advised to keep her aerobic activity to baseline and remain hypervigilant until she has follow up with [facility]. Differentials to consider for chest pain include MS etiology as pain correlates to when she starting working with trainer. Low clinical suspicion for ACS today however based on cardiac history, advised we remain vigilant, conservative. If pain recurs, advised ED. She will also call the office and let me know when appointment with [facility] is; if not soon, I will call to have moved sooner.       Relevant Medications   Icosapent Ethyl (VASCEPA) 1 g CAPS   Hemorrhoids   Relevant Medications   Icosapent Ethyl (VASCEPA) 1 g CAPS   hydrocortisone (ANUSOL-HC) 25 MG suppository     Digestive   GERD (gastroesophageal reflux disease)    Controlled. Continues to follow with GI, will follow.        Other Visit Diagnoses    Chest pain, unspecified type       Relevant Orders   EKG 12-Lead (Completed)       I have discontinued Ixel Masters's predniSONE. I am also having her maintain her Biotin, aspirin, cholecalciferol, Lifitegrast, acetaminophen, multivitamin with minerals, sodium chloride, levothyroxine, traZODone, losartan, metaxalone, acyclovir, traZODone, montelukast, rosuvastatin, omeprazole, hydrochlorothiazide, Icosapent Ethyl, and hydrocortisone.   Meds ordered this encounter  Medications  .  hydrocortisone (ANUSOL-HC) 25 MG suppository    Sig: Place 1 suppository (25 mg total) rectally 2 (two) times daily as needed for hemorrhoids or anal itching.    Dispense:  12 suppository    Refill:  2    Return precautions given.   Risks, benefits, and alternatives of the medications and treatment plan prescribed today were discussed, and patient expressed understanding.   Education regarding symptom management and diagnosis given to patient on AVS.  Continue to follow with [name], FNP for routine health maintenance.   Jennifer [name] and I agreed with plan.   [name], FNP

## 2017-03-27 NOTE — Assessment & Plan Note (Signed)
Controlled. Continues to follow with GI, will follow.

## 2017-03-27 NOTE — Assessment & Plan Note (Addendum)
At goal. She is not on HCTZ today and dizziness improved. Not orthostatic ( see flow sheet). We discussed trial of losartan 50mg  ( had been on 100mg  ) since she is still symptomatic, though improved.  Reassured by EKG today- no acute ischemia. When compared to prior 05/2016 and 08/2014, no significant changes. Advised to keep her aerobic activity to baseline and remain hypervigilant until she has follow up with [facility]. Differentials to consider for chest pain include MS etiology as pain correlates to when she starting working with trainer. Low clinical suspicion for ACS today however based on cardiac history, advised we remain vigilant, conservative. If pain recurs, advised ED. She will also call the office and let me know when appointment with [facility] is; if not soon, I will call to have moved sooner.

## 2017-03-27 NOTE — Patient Instructions (Addendum)
Stay vigilant with chest pain-- if recurs please go to nearest emergency room  Call office and let me know when appointment is with Dr Clayborn Bigness  I would stay at baseline aerobic activity until your follow up with Dr Clayborn Bigness   May try pataday for itchy eyes   Follow up 3 months    Heart Attack A heart attack (myocardial infarction, MI) causes damage to the heart that cannot be fixed. A heart attack often happens when a blood clot or other blockage cuts blood flow to the heart. When this happens, certain areas of the heart begin to die. This causes the pain you feel during a heart attack. Follow these instructions at home:  Take medicine as told by your doctor. You may need medicine to: ? Keep your blood from clotting too easily. ? Control your blood pressure. ? Lower your cholesterol. ? Control abnormal heart rhythms.  Change certain behaviors as told by your doctor. This may include: ? Quitting smoking. ? Being active. ? Eating a heart-healthy diet. Ask your doctor for help with this diet. ? Keeping a healthy weight. ? Keeping your diabetes under control. ? Lessening stress. ? Limiting how much alcohol you drink. Do not take these medicines unless your doctor says that you can:  Nonsteroidal anti-inflammatory drugs (NSAIDs). These include: ? Ibuprofen. ? Naproxen. ? Celecoxib.  Vitamin supplements that have vitamin A, vitamin E, or both.  Hormone therapy that contains estrogen with or without progestin.  Get help right away if:  You have sudden chest discomfort.  You have sudden discomfort in your: ? Arms. ? Back. ? Neck. ? Jaw.  You have shortness of breath at any time.  You have sudden sweating or clammy skin.  You feel sick to your stomach (nauseous) or throw up (vomit).  You suddenly get light-headed or dizzy.  You feel your heart beating fast or skipping beats. These symptoms may be an emergency. Do not wait to see if the symptoms will go away. Get  medical help right away. Call your local emergency services (911 in the [place] Do not drive yourself to the hospital. This information is not intended to replace advice given to you by your health care provider. Make sure you discuss any questions you have with your health care provider. Document Released: 07/05/2011 Document Revised: 06/11/2015 Document Reviewed: 03/08/2013 Elsevier Interactive Patient Education  2017 [employer].

## 2017-03-29 NOTE — Telephone Encounter (Signed)
noted 

## 2017-03-30 ENCOUNTER — Encounter: Payer: Self-pay | Admitting: *Deleted

## 2017-03-30 ENCOUNTER — Other Ambulatory Visit: Payer: Self-pay | Admitting: Family

## 2017-03-30 DIAGNOSIS — Z0000 Encounter for general adult medical examination without abnormal findings: Secondary | ICD-10-CM

## 2017-03-30 NOTE — Telephone Encounter (Signed)
Mailed letter °

## 2017-04-07 ENCOUNTER — Other Ambulatory Visit: Payer: Self-pay | Admitting: Family

## 2017-04-10 ENCOUNTER — Other Ambulatory Visit: Payer: Self-pay | Admitting: Family

## 2017-04-10 DIAGNOSIS — R0602 Shortness of breath: Secondary | ICD-10-CM | POA: Diagnosis not present

## 2017-04-10 DIAGNOSIS — R001 Bradycardia, unspecified: Secondary | ICD-10-CM | POA: Diagnosis not present

## 2017-04-10 DIAGNOSIS — E669 Obesity, unspecified: Secondary | ICD-10-CM | POA: Diagnosis not present

## 2017-04-10 DIAGNOSIS — I10 Essential (primary) hypertension: Secondary | ICD-10-CM | POA: Diagnosis not present

## 2017-04-10 DIAGNOSIS — M1990 Unspecified osteoarthritis, unspecified site: Secondary | ICD-10-CM | POA: Diagnosis not present

## 2017-04-10 DIAGNOSIS — G4733 Obstructive sleep apnea (adult) (pediatric): Secondary | ICD-10-CM | POA: Diagnosis not present

## 2017-04-10 DIAGNOSIS — R42 Dizziness and giddiness: Secondary | ICD-10-CM | POA: Diagnosis not present

## 2017-04-10 DIAGNOSIS — R5383 Other fatigue: Secondary | ICD-10-CM | POA: Diagnosis not present

## 2017-04-10 DIAGNOSIS — E7849 Other hyperlipidemia: Secondary | ICD-10-CM | POA: Diagnosis not present

## 2017-04-10 DIAGNOSIS — R9431 Abnormal electrocardiogram [ECG] [EKG]: Secondary | ICD-10-CM | POA: Diagnosis not present

## 2017-04-10 DIAGNOSIS — R600 Localized edema: Secondary | ICD-10-CM | POA: Diagnosis not present

## 2017-04-10 DIAGNOSIS — R011 Cardiac murmur, unspecified: Secondary | ICD-10-CM | POA: Diagnosis not present

## 2017-04-11 ENCOUNTER — Other Ambulatory Visit: Payer: Self-pay | Admitting: Family

## 2017-04-11 MED ORDER — LEVOTHYROXINE SODIUM 25 MCG PO TABS
25.0000 ug | ORAL_TABLET | Freq: Every day | ORAL | 0 refills | Status: DC
Start: 2017-04-11 — End: 2017-07-06

## 2017-04-11 NOTE — Telephone Encounter (Signed)
Copied from [place] [phone]. Topic: Quick Communication - See Telephone Encounter >> Apr 11, 2017 11:26 AM Conception Chancy, NT wrote: CRM for notification. See Telephone encounter for: 04/11/17.  Patient is calling and states she is needing a refill on levothyroxine (SYNTHROID, LEVOTHROID) 25 MCG tablet  and states the pharmacy has not heard anything from this office in a week. Please advise  [id number] - [place], [place] - [address]  [address] [place] [address]  Phone: [phone] Fax: [phone]

## 2017-04-11 NOTE — Telephone Encounter (Signed)
Script sent  

## 2017-05-07 ENCOUNTER — Other Ambulatory Visit: Payer: Self-pay | Admitting: Family

## 2017-05-07 DIAGNOSIS — Z0000 Encounter for general adult medical examination without abnormal findings: Secondary | ICD-10-CM

## 2017-05-16 ENCOUNTER — Other Ambulatory Visit: Payer: Self-pay | Admitting: Family

## 2017-05-16 DIAGNOSIS — E785 Hyperlipidemia, unspecified: Secondary | ICD-10-CM

## 2017-05-31 DIAGNOSIS — S62609A Fracture of unspecified phalanx of unspecified finger, initial encounter for closed fracture: Secondary | ICD-10-CM | POA: Insufficient documentation

## 2017-05-31 DIAGNOSIS — M7541 Impingement syndrome of right shoulder: Secondary | ICD-10-CM | POA: Diagnosis not present

## 2017-05-31 DIAGNOSIS — M545 Low back pain: Secondary | ICD-10-CM | POA: Diagnosis not present

## 2017-05-31 DIAGNOSIS — S62514A Nondisplaced fracture of proximal phalanx of right thumb, initial encounter for closed fracture: Secondary | ICD-10-CM | POA: Diagnosis not present

## 2017-05-31 DIAGNOSIS — M542 Cervicalgia: Secondary | ICD-10-CM | POA: Diagnosis not present

## 2017-05-31 DIAGNOSIS — M7540 Impingement syndrome of unspecified shoulder: Secondary | ICD-10-CM | POA: Insufficient documentation

## 2017-06-05 ENCOUNTER — Telehealth: Payer: Self-pay

## 2017-06-05 ENCOUNTER — Ambulatory Visit (INDEPENDENT_AMBULATORY_CARE_PROVIDER_SITE_OTHER): Payer: Medicare Other | Admitting: Family

## 2017-06-05 ENCOUNTER — Telehealth: Payer: Self-pay | Admitting: Family

## 2017-06-05 ENCOUNTER — Encounter: Payer: Self-pay | Admitting: Family

## 2017-06-05 VITALS — BP 140/80 | HR 72 | Temp 98.5°F | Wt 191.4 lb

## 2017-06-05 DIAGNOSIS — I10 Essential (primary) hypertension: Secondary | ICD-10-CM

## 2017-06-05 DIAGNOSIS — K219 Gastro-esophageal reflux disease without esophagitis: Secondary | ICD-10-CM

## 2017-06-05 DIAGNOSIS — R7301 Impaired fasting glucose: Secondary | ICD-10-CM

## 2017-06-05 DIAGNOSIS — Z0000 Encounter for general adult medical examination without abnormal findings: Secondary | ICD-10-CM | POA: Diagnosis not present

## 2017-06-05 DIAGNOSIS — M1990 Unspecified osteoarthritis, unspecified site: Secondary | ICD-10-CM

## 2017-06-05 DIAGNOSIS — E039 Hypothyroidism, unspecified: Secondary | ICD-10-CM | POA: Diagnosis not present

## 2017-06-05 DIAGNOSIS — K760 Fatty (change of) liver, not elsewhere classified: Secondary | ICD-10-CM | POA: Diagnosis not present

## 2017-06-05 DIAGNOSIS — Z23 Encounter for immunization: Secondary | ICD-10-CM | POA: Diagnosis not present

## 2017-06-05 LAB — HEMOGLOBIN A1C: Hgb A1c MFr Bld: 6.4 % (ref 4.6–6.5)

## 2017-06-05 LAB — TSH: TSH: 0.54 u[IU]/mL (ref 0.35–4.50)

## 2017-06-05 MED ORDER — ACYCLOVIR 400 MG PO TABS: 400.0000 mg | ORAL_TABLET | Freq: Every day | ORAL | 0 refills | Status: DC

## 2017-06-05 MED ORDER — LIDOCAINE 5 % EX PTCH
1.0000 | MEDICATED_PATCH | CUTANEOUS | 0 refills | Status: DC
Start: 2017-06-05 — End: 2017-11-27

## 2017-06-05 MED ORDER — OMEPRAZOLE 20 MG PO CPDR: 20.0000 mg | DELAYED_RELEASE_CAPSULE | Freq: Every day | ORAL | 1 refills | Status: DC

## 2017-06-05 MED ORDER — DICLOFENAC SODIUM 1 % TD GEL: 4.0000 g | Freq: Four times a day (QID) | TRANSDERMAL | 3 refills | Status: DC

## 2017-06-05 NOTE — Telephone Encounter (Signed)
Call pt  She never had CT lung cancer screen  I would advise this if she meets criteria  Criteria for low dose lung cancer screening scan:  Age between 55-77   Current smoker or former if quit within the last 15 years  History of smoking at least 1 pack a day for 30 years or that equivalent. (2 packs a day for 15 years,  pack a day for 60 years)  I had ordered in the past for you.   If it is a screen that you a still interested in, please call the office or send me a mychart message to let me know so that I can order for you.

## 2017-06-05 NOTE — Patient Instructions (Addendum)
Labs   As discussed , lets focus on lifestyle for blood pressure. However if blood pressure becomes unruly ( higher than 150s), please call me.   Monitor blood pressure,  Goal is less than 130/80; if persistently higher, please make sooner follow up appointment so we can recheck you blood pressure and manage medications   We really need to limit or refrain from meloxicam, ibuprofen as these anti inflammatory medicaitons will INCREASE blood pressure.   Limit salt.   Do not go above 100mg  with losartan.   Ask pharmacist what cholesterol medication is on drug list.   Make sure you speak with GI about the elevated liver enzymes and acid reflux.    Managing Your Hypertension Hypertension is commonly called high blood pressure. This is when the force of your blood pressing against the walls of your arteries is too strong. Arteries are blood vessels that carry blood from your heart throughout your body. Hypertension forces the heart to work harder to pump blood, and may cause the arteries to become narrow or stiff. Having untreated or uncontrolled hypertension can cause heart attack, stroke, kidney disease, and other problems. What are blood pressure readings? A blood pressure reading consists of a higher number over a lower number. Ideally, your blood pressure should be below 120/80. The first ("top") number is called the systolic pressure. It is a measure of the pressure in your arteries as your heart beats. The second ("bottom") number is called the diastolic pressure. It is a measure of the pressure in your arteries as the heart relaxes. What does my blood pressure reading mean? Blood pressure is classified into four stages. Based on your blood pressure reading, your health care provider may use the following stages to determine what type of treatment you need, if any. Systolic pressure and diastolic pressure are measured in a unit called mm Hg. Normal  Systolic pressure: below  120.  Diastolic pressure: below 80. Elevated  Systolic pressure: 664-403.  Diastolic pressure: below 80. Hypertension stage 1  Systolic pressure: 474-259.  Diastolic pressure: 56-38. Hypertension stage 2  Systolic pressure: 756 or above.  Diastolic pressure: 90 or above. What health risks are associated with hypertension? Managing your hypertension is an important responsibility. Uncontrolled hypertension can lead to:  A heart attack.  A stroke.  A weakened blood vessel (aneurysm).  Heart failure.  Kidney damage.  Eye damage.  Metabolic syndrome.  Memory and concentration problems.  What changes can I make to manage my hypertension? Hypertension can be managed by making lifestyle changes and possibly by taking medicines. Your health care provider will help you make a plan to bring your blood pressure within a normal range. Eating and drinking  Eat a diet that is high in fiber and potassium, and low in salt (sodium), added sugar, and fat. An example eating plan is called the DASH (Dietary Approaches to Stop Hypertension) diet. To eat this way: ? Eat plenty of fresh fruits and vegetables. Try to fill half of your plate at each meal with fruits and vegetables. ? Eat whole grains, such as whole wheat pasta, brown rice, or whole grain bread. Fill about one quarter of your plate with whole grains. ? Eat low-fat diary products. ? Avoid fatty cuts of meat, processed or cured meats, and poultry with skin. Fill about one quarter of your plate with lean proteins such as fish, chicken without skin, beans, eggs, and tofu. ? Avoid premade and processed foods. These tend to be higher in sodium, added  sugar, and fat.  Reduce your daily sodium intake. Most people with hypertension should eat less than 1,500 mg of sodium a day.  Limit alcohol intake to no more than 1 drink a day for nonpregnant women and 2 drinks a day for men. One drink equals 12 oz of beer, 5 oz of wine, or 1 oz of  hard liquor. Lifestyle  Work with your health care provider to maintain a healthy body weight, or to lose weight. Ask what an ideal weight is for you.  Get at least 30 minutes of exercise that causes your heart to beat faster (aerobic exercise) most days of the week. Activities may include walking, swimming, or biking.  Include exercise to strengthen your muscles (resistance exercise), such as weight lifting, as part of your weekly exercise routine. Try to do these types of exercises for 30 minutes at least 3 days a week.  Do not use any products that contain nicotine or tobacco, such as cigarettes and e-cigarettes. If you need help quitting, ask your health care provider.  Control any long-term (chronic) conditions you have, such as high cholesterol or diabetes. Monitoring  Monitor your blood pressure at home as told by your health care provider. Your personal target blood pressure may vary depending on your medical conditions, your age, and other factors.  Have your blood pressure checked regularly, as often as told by your health care provider. Working with your health care provider  Review all the medicines you take with your health care provider because there may be side effects or interactions.  Talk with your health care provider about your diet, exercise habits, and other lifestyle factors that may be contributing to hypertension.  Visit your health care provider regularly. Your health care provider can help you create and adjust your plan for managing hypertension. Will I need medicine to control my blood pressure? Your health care provider may prescribe medicine if lifestyle changes are not enough to get your blood pressure under control, and if:  Your systolic blood pressure is 130 or higher.  Your diastolic blood pressure is 80 or higher.  Take medicines only as told by your health care provider. Follow the directions carefully. Blood pressure medicines must be taken as  prescribed. The medicine does not work as well when you skip doses. Skipping doses also puts you at risk for problems. Contact a health care provider if:  You think you are having a reaction to medicines you have taken.  You have repeated (recurrent) headaches.  You feel dizzy.  You have swelling in your ankles.  You have trouble with your vision. Get help right away if:  You develop a severe headache or confusion.  You have unusual weakness or numbness, or you feel faint.  You have severe pain in your chest or abdomen.  You vomit repeatedly.  You have trouble breathing. Summary  Hypertension is when the force of blood pumping through your arteries is too strong. If this condition is not controlled, it may put you at risk for serious complications.  Your personal target blood pressure may vary depending on your medical conditions, your age, and other factors. For most people, a normal blood pressure is less than 120/80.  Hypertension is managed by lifestyle changes, medicines, or both. Lifestyle changes include weight loss, eating a healthy, low-sodium diet, exercising more, and limiting alcohol. This information is not intended to replace advice given to you by your health care provider. Make sure you discuss any questions you have  with your health care provider. Document Released: 09/28/2011 Document Revised: 12/02/2015 Document Reviewed: 12/02/2015 Elsevier Interactive Patient Education  [employer].

## 2017-06-05 NOTE — Assessment & Plan Note (Signed)
Advised to limit NSAIDs. Will try topical therapies.

## 2017-06-05 NOTE — Progress Notes (Signed)
Subjective:    Patient ID: Jennifer [name], female    DOB: 03/15/1950, 67 y.o.   MRN: [id number]  CC: Jennifer [name] is a 67 y.o. female who presents today for follow up.   HPI: Feels well. No complaints.   HTN- at home 160s/ 70-80s In the evening 115/68. Denies exertional chest pain or pressure, numbness or tingling radiating to left arm or jaw, palpitations, dizziness, frequent headaches, changes in vision, or shortness of breath.    Taking tyelonol and mobic for pain. Wearing cipap every night, 4 hours + at night.   Adding salt to food.   Had been olmesartan-hctz 40-25, losartan 100 , losartan 50, valsartan 320 mg.  [place] 03/2017-increased losartan 200 once a day.  Consider adding beta-blocker therapy.  On Crestor.  ?consider CT chest  Interval: Right shoulder steroid injection , also has fractured right thumb. Dr Jennifer [name] at [facility].  Working at [employer] now.   Pending follow up GI consult July 2019 with Jennifer [name] for persistently elevated liver enzymes.    HISTORY:  Past Medical History:  Diagnosis Date  . Constipation   . Elevated liver enzymes   . Hemorrhoids   . High cholesterol   . Hyperlipidemia   . Hypertension   . Hypothyroidism    Past Surgical History:  Procedure Laterality Date  . ABDOMINAL HYSTERECTOMY     total for fibroids no h/o abnormal pap  . bariatric sleeve  2015  . BREAST EXCISIONAL BIOPSY Left 1998  . carpal tunnel repair    . COLONOSCOPY WITH PROPOFOL N/A 02/11/2016   Procedure: COLONOSCOPY WITH PROPOFOL;  Surgeon: Jennifer Bellows, MD;  Location: [facility] ENDOSCOPY;  Service: Endoscopy;  Laterality: N/A;  . HEMORRHOID SURGERY     Family History  Problem Relation Age of Onset  . Breast cancer Sister 26       materal 1/2 sister    Allergies: Celecoxib; Pollen extract; Nasacort [triamcinolone]; and Vicodin [hydrocodone-acetaminophen] Current Outpatient Medications on File Prior to Visit  Medication Sig Dispense Refill  . acetaminophen  (TYLENOL) 500 MG tablet Take 1,000 mg by mouth every 6 (six) hours as needed.     [name] Kitchen aspirin 81 MG tablet Take 81 mg by mouth daily.    . Biotin 5000 MCG CAPS Take 5,000 mcg by mouth daily.     . cholecalciferol (VITAMIN D) 1000 UNITS tablet Take 400 Units by mouth daily.     . hydrocortisone (ANUSOL-HC) 25 MG suppository Place 1 suppository (25 mg total) rectally 2 (two) times daily as needed for hemorrhoids or anal itching. 12 suppository 2  . levothyroxine (SYNTHROID, LEVOTHROID) 25 MCG tablet Take 1 tablet (25 mcg total) by mouth daily. 90 tablet 0  . losartan (COZAAR) 100 MG tablet TAKE 1 TABLET BY MOUTH EVERY DAY 90 tablet 1  . metaxalone (SKELAXIN) 800 MG tablet Take by mouth.    . Multiple Vitamins-Minerals (MULTIVITAMIN WITH MINERALS) tablet Take 1 tablet by mouth daily.    . rosuvastatin (CRESTOR) 40 MG tablet TAKE 1 TABLET BY MOUTH EVERY DAY 90 tablet 1  . sodium chloride (OCEAN) 0.65 % SOLN nasal spray Place 1 spray into both nostrils as needed for congestion.    . traZODone (DESYREL) 50 MG tablet Take 1 tablet (50 mg total) by mouth at bedtime. 90 tablet 1   No current facility-administered medications on file prior to visit.     Social History   Tobacco Use  . Smoking status: Former Research scientist (life sciences)  . Smokeless tobacco:  Never Used  Substance Use Topics  . Alcohol use: Yes    Alcohol/week: 8.4 oz    Types: 14 Glasses of wine per week  . Drug use: No    Review of Systems  Constitutional: Negative for chills and fever.  Respiratory: Negative for cough.   Cardiovascular: Negative for chest pain and palpitations.  Gastrointestinal: Negative for nausea and vomiting.  Musculoskeletal: Positive for arthralgias (chronic).      Objective:    BP 140/80   Pulse 72   Temp 98.5 F (36.9 C) (Oral)   Wt 191 lb 6 oz (86.8 kg)   SpO2 99%   BMI 36.16 kg/m  BP Readings from Last 3 Encounters:  06/05/17 140/80  03/27/17 (!) 108/58  03/07/17 (!) 150/88   Wt Readings from Last 3  Encounters:  06/05/17 191 lb 6 oz (86.8 kg)  03/27/17 197 lb 8 oz (89.6 kg)  03/07/17 195 lb (88.5 kg)    Physical Exam  Constitutional: She appears well-developed and well-nourished.  Eyes: Conjunctivae are normal.  Cardiovascular: Normal rate, regular rhythm, normal heart sounds and normal pulses.  Pulmonary/Chest: Effort normal and breath sounds normal. She has no wheezes. She has no rhonchi. She has no rales.  Neurological: She is alert.  Skin: Skin is warm and dry.  Psychiatric: She has a normal mood and affect. Her speech is normal and behavior is normal. Thought content normal.  Vitals reviewed.      Assessment & Plan:   Problem List Items Addressed This Visit      Cardiovascular and Mediastinum   Essential hypertension - Primary    Systolic slightly elevated today. Suspect salt, NSAIDs contributory. Losartan at max 100mg . Hesistant to start beta blocker as HR runs on low end, still normal. Would consider CCB, or HCTZ again. However at this gesture, patient is asymptomatic and close to goal. We agreed lifestyle, close vigilance preferable.         Digestive   GERD (gastroesophageal reflux disease)    Following with GI. Advised to stay on PPI. Will follow.      Relevant Medications   omeprazole (PRILOSEC) 20 MG capsule   Fatty liver    Following with [facility]. Follow up with them 07/2017. Will follow.         Endocrine   Impaired fasting glucose   Relevant Orders   TSH   Hemoglobin A1c   Hypothyroidism   Relevant Orders   TSH     Musculoskeletal and Integument   Arthritis    Advised to limit NSAIDs. Will try topical therapies.       Relevant Medications   diclofenac sodium (VOLTAREN) 1 % GEL   lidocaine (LIDODERM) 5 %     Other   Routine physical examination   Relevant Medications   acyclovir (ZOVIRAX) 400 MG tablet       I have discontinued Bianney Hurd's Lifitegrast, montelukast, hydrochlorothiazide, and Icosapent Ethyl. I have also  changed her omeprazole. Additionally, I am having her start on diclofenac sodium and lidocaine. Lastly, I am having her maintain her Biotin, aspirin, cholecalciferol, acetaminophen, multivitamin with minerals, sodium chloride, traZODone, metaxalone, losartan, hydrocortisone, levothyroxine, rosuvastatin, and acyclovir.   Meds ordered this encounter  Medications  . acyclovir (ZOVIRAX) 400 MG tablet    Sig: Take 1 tablet (400 mg total) by mouth daily.    Dispense:  90 tablet    Refill:  0  . omeprazole (PRILOSEC) 20 MG capsule    Sig: Take 1 capsule (  20 mg total) by mouth daily.    Dispense:  90 capsule    Refill:  1  . diclofenac sodium (VOLTAREN) 1 % GEL    Sig: Apply 4 g topically 4 (four) times daily.    Dispense:  1 Tube    Refill:  3    Order Specific Question:   Supervising Provider    Answer:   Derrel Nip, TERESA L [[id number]]  . lidocaine (LIDODERM) 5 %    Sig: Place 1 patch onto the skin daily. Remove & Discard patch within 12 hours.    Dispense:  30 patch    Refill:  0    Order Specific Question:   Supervising Provider    Answer:   Crecencio Mc [[id number]]    Return precautions given.   Risks, benefits, and alternatives of the medications and treatment plan prescribed today were discussed, and patient expressed understanding.   Education regarding symptom management and diagnosis given to patient on AVS.  Continue to follow with [name], FNP for routine health maintenance.   Jennifer [name] and I agreed with plan.   [name], FNP

## 2017-06-05 NOTE — Addendum Note (Signed)
Addended by: Johna Sheriff on: 06/05/2017 11:29 AM   Modules accepted: Orders

## 2017-06-05 NOTE — Telephone Encounter (Signed)
Patient was given lab results and states that she would like the nutrition consult and the sooner the better while she is out of work. Patient verbalized understanding of results. I' am not able to results the note.

## 2017-06-05 NOTE — Telephone Encounter (Signed)
Referral placed.

## 2017-06-05 NOTE — Assessment & Plan Note (Addendum)
Systolic slightly elevated today. Suspect salt, NSAIDs contributory. Losartan at max 100mg . Hesistant to start beta blocker as HR runs on low end, still normal. Would consider CCB, or HCTZ again. However at this gesture, patient is asymptomatic and close to goal. We agreed lifestyle, close vigilance preferable.

## 2017-06-05 NOTE — Addendum Note (Signed)
Addended by: [name] on: 06/05/2017 03:26 PM   Modules accepted: Orders

## 2017-06-05 NOTE — Assessment & Plan Note (Signed)
Following with GI. Advised to stay on PPI. Will follow.

## 2017-06-05 NOTE — Assessment & Plan Note (Signed)
Following with [facility]. Follow up with them 07/2017. Will follow.

## 2017-06-21 DIAGNOSIS — S62514A Nondisplaced fracture of proximal phalanx of right thumb, initial encounter for closed fracture: Secondary | ICD-10-CM | POA: Diagnosis not present

## 2017-06-28 ENCOUNTER — Ambulatory Visit: Payer: Medicare Other | Admitting: Family

## 2017-06-28 ENCOUNTER — Other Ambulatory Visit: Payer: Medicare Other

## 2017-07-06 ENCOUNTER — Other Ambulatory Visit: Payer: Self-pay | Admitting: Family

## 2017-07-06 DIAGNOSIS — R031 Nonspecific low blood-pressure reading: Secondary | ICD-10-CM | POA: Diagnosis not present

## 2017-07-06 DIAGNOSIS — R110 Nausea: Secondary | ICD-10-CM | POA: Diagnosis not present

## 2017-07-06 DIAGNOSIS — R197 Diarrhea, unspecified: Secondary | ICD-10-CM | POA: Diagnosis not present

## 2017-07-06 DIAGNOSIS — R319 Hematuria, unspecified: Secondary | ICD-10-CM | POA: Diagnosis not present

## 2017-07-06 DIAGNOSIS — R1032 Left lower quadrant pain: Secondary | ICD-10-CM | POA: Diagnosis not present

## 2017-07-06 DIAGNOSIS — R5383 Other fatigue: Secondary | ICD-10-CM | POA: Diagnosis not present

## 2017-07-06 DIAGNOSIS — R42 Dizziness and giddiness: Secondary | ICD-10-CM | POA: Diagnosis not present

## 2017-07-07 NOTE — Telephone Encounter (Signed)
Called patient and she declined testing at this time but states that she may reconsider later.

## 2017-07-07 NOTE — Telephone Encounter (Signed)
noted 

## 2017-07-07 NOTE — Telephone Encounter (Signed)
Call pt see below

## 2017-07-12 ENCOUNTER — Ambulatory Visit (INDEPENDENT_AMBULATORY_CARE_PROVIDER_SITE_OTHER): Payer: Medicare Other | Admitting: Family

## 2017-07-12 ENCOUNTER — Encounter: Payer: Self-pay | Admitting: Family

## 2017-07-12 VITALS — BP 128/78 | HR 80 | Temp 98.2°F | Wt 193.5 lb

## 2017-07-12 DIAGNOSIS — I10 Essential (primary) hypertension: Secondary | ICD-10-CM

## 2017-07-12 DIAGNOSIS — M1990 Unspecified osteoarthritis, unspecified site: Secondary | ICD-10-CM | POA: Diagnosis not present

## 2017-07-12 DIAGNOSIS — Z0000 Encounter for general adult medical examination without abnormal findings: Secondary | ICD-10-CM | POA: Diagnosis not present

## 2017-07-12 DIAGNOSIS — G4700 Insomnia, unspecified: Secondary | ICD-10-CM

## 2017-07-12 DIAGNOSIS — E039 Hypothyroidism, unspecified: Secondary | ICD-10-CM | POA: Diagnosis not present

## 2017-07-12 MED ORDER — ACYCLOVIR 400 MG PO TABS: 400.0000 mg | ORAL_TABLET | Freq: Every day | ORAL | 1 refills | Status: DC

## 2017-07-12 MED ORDER — LEVOTHYROXINE SODIUM 25 MCG PO TABS: 25.0000 ug | ORAL_TABLET | Freq: Every day | ORAL | 1 refills | Status: DC

## 2017-07-12 MED ORDER — LOSARTAN POTASSIUM 100 MG PO TABS: 100.0000 mg | ORAL_TABLET | Freq: Every day | ORAL | 1 refills | Status: DC

## 2017-07-12 MED ORDER — TRAZODONE HCL 50 MG PO TABS: 50.0000 mg | ORAL_TABLET | Freq: Every day | ORAL | 1 refills | Status: DC

## 2017-07-12 NOTE — Assessment & Plan Note (Signed)
Controlled. Overall improved with reduction of dietary salt. Continue regimen. Will continue to monitor at home and consider beta blocker if blood pressure elevated.

## 2017-07-12 NOTE — Assessment & Plan Note (Signed)
Stable. Continue prn trazodone

## 2017-07-12 NOTE — Patient Instructions (Signed)
Monitor blood pressure,  Goal is less than 130/80; if persistently higher, please make sooner follow up appointment so we can recheck you blood pressure and manage medications  Some pharmacies offer nutrition classes as well.   As we discussed, weight loss clinics to consider which I believe offer nutrition courses.    [facility]  [place], [place] [address]  [phone] [email or web address] No referral needed.  ** My preferred clinic**    [facility] in [place];  for more   information, call the [employer] at [phone]. You may also find more information at [email or web address] for FREE weight loss surgery seminar.     GOOOD LUCK!   Calorie Counting for Weight Loss Calories are energy you get from the things you eat and drink. Your body uses this energy to keep you going throughout the day. The number of calories you eat affects your weight. When you eat more calories than your body needs, your body stores the extra calories as fat. When you eat fewer calories than your body needs, your body burns fat to get the energy it needs. Calorie counting means keeping track of how many calories you eat and drink each day. If you make sure to eat fewer calories than your body needs, you should lose weight. In order for calorie counting to work, you will need to eat the number of calories that are right for you in a day to lose a healthy amount of weight per week. A healthy amount of weight to lose per week is usually 1-2 lb (0.5-0.9 kg). A dietitian can determine how many calories you need in a day and give you suggestions on how to reach your calorie goal.  WHAT IS MY MY PLAN? My goal is to have 1800  calories per day.  If I have this many calories per day, I should lose around 1-2 pounds per week. WHAT DO I NEED TO KNOW ABOUT CALORIE  COUNTING? In order to meet your daily calorie goal, you will need to:  Find out how many calories are in each food you would like to eat. Try to do this before you eat.  Decide how much of the food you can eat.  Write down what you ate and how many calories it had. Doing this is called keeping a food log. WHERE DO I FIND CALORIE INFORMATION? The number of calories in a food can be found on a Nutrition Facts label. Note that all the information on a label is based on a specific serving of the food. If a food does not have a Nutrition Facts label, try to look up the calories online or ask your dietitian for help. HOW DO I DECIDE HOW MUCH TO EAT? To decide how much of the food you can eat, you will need to consider both the number of calories in one serving and the size of one serving. This information can be found on the Nutrition Facts label. If a food does not have a Nutrition Facts label, look up the information online or ask your dietitian for help. Remember that calories are listed per serving. If you choose to have more than one serving of a food, you will have to multiply the calories per serving by the amount of servings you plan to eat. For example, the label on a package of bread might say that a serving size is 1 slice and that there are 90  calories in a serving. If you eat 1 slice, you will have eaten 90 calories. If you eat 2 slices, you will have eaten 180 calories. HOW DO I KEEP A FOOD LOG? After each meal, record the following information in your food log:  What you ate.  How much of it you ate.  How many calories it had.  Then, add up your calories. Keep your food log near you, such as in a small notebook in your pocket. Another option is to use a mobile app or website. Some programs will calculate calories for you and show you how many calories you have left each time you add an item to the log. WHAT ARE SOME CALORIE COUNTING TIPS?  Use your calories on foods and drinks that  will fill you up and not leave you hungry. Some examples of this include foods like nuts and nut butters, vegetables, lean proteins, and high-fiber foods (more than 5 g fiber per serving).  Eat nutritious foods and avoid empty calories. Empty calories are calories you get from foods or beverages that do not have many nutrients, such as candy and soda. It is better to have a nutritious high-calorie food (such as an avocado) than a food with few nutrients (such as a bag of chips).  Know how many calories are in the foods you eat most often. This way, you do not have to look up how many calories they have each time you eat them.  Look out for foods that may seem like low-calorie foods but are really high-calorie foods, such as baked goods, soda, and fat-free candy.  Pay attention to calories in drinks. Drinks such as sodas, specialty coffee drinks, alcohol, and juices have a lot of calories yet do not fill you up. Choose low-calorie drinks like water and diet drinks.  Focus your calorie counting efforts on higher calorie items. Logging the calories in a garden salad that contains only vegetables is less important than calculating the calories in a milk shake.  Find a way of tracking calories that works for you. Get creative. Most people who are successful find ways to keep track of how much they eat in a day, even if they do not count every calorie. WHAT ARE SOME PORTION CONTROL TIPS?  Know how many calories are in a serving. This will help you know how many servings of a certain food you can have.  Use a measuring cup to measure serving sizes. This is helpful when you start out. With time, you will be able to estimate serving sizes for some foods.  Take some time to put servings of different foods on your favorite plates, bowls, and cups so you know what a serving looks like.  Try not to eat straight from a bag or box. Doing this can lead to overeating. Put the amount you would like to eat in a  cup or on a plate to make sure you are eating the right portion.  Use smaller plates, glasses, and bowls to prevent overeating. This is a quick and easy way to practice portion control. If your plate is smaller, less food can fit on it.  Try not to multitask while eating, such as watching TV or using your computer. If it is time to eat, sit down at a table and enjoy your food. Doing this will help you to start recognizing when you are full. It will also make you more aware of what and how much you are eating. HOW CAN  I CALORIE COUNT WHEN EATING OUT?  Ask for smaller portion sizes or child-sized portions.  Consider sharing an entree and sides instead of getting your own entree.  If you get your own entree, eat only half. Ask for a box at the beginning of your meal and put the rest of your entree in it so you are not tempted to eat it.  Look for the calories on the menu. If calories are listed, choose the lower calorie options.  Choose dishes that include vegetables, fruits, whole grains, low-fat dairy products, and lean protein. Focusing on smart food choices from each of the 5 food groups can help you stay on track at restaurants.  Choose items that are boiled, broiled, grilled, or steamed.  Choose water, milk, unsweetened iced tea, or other drinks without added sugars. If you want an alcoholic beverage, choose a lower calorie option. For example, a regular margarita can have up to 700 calories and a glass of wine has around 150.  Stay away from items that are buttered, battered, fried, or served with cream sauce. Items labeled "crispy" are usually fried, unless stated otherwise.  Ask for dressings, sauces, and syrups on the side. These are usually very high in calories, so do not eat much of them.  Watch out for salads. Many people think salads are a healthy option, but this is often not the case. Many salads come with bacon, fried chicken, lots of cheese, fried chips, and dressing. All of  these items have a lot of calories. If you want a salad, choose a garden salad and ask for grilled meats or steak. Ask for the dressing on the side, or ask for olive oil and vinegar or lemon to use as dressing.  Estimate how many servings of a food you are given. For example, a serving of cooked rice is  cup or about the size of half a tennis ball or one cupcake wrapper. Knowing serving sizes will help you be aware of how much food you are eating at restaurants. The list below tells you how big or small some common portion sizes are based on everyday objects.  1 oz--4 stacked dice.  3 oz--1 deck of cards.  1 tsp--1 dice.  1 Tbsp-- a Ping-Pong ball.  2 Tbsp--1 Ping-Pong ball.   cup--1 tennis ball or 1 cupcake wrapper.  1 cup--1 baseball.   This information is not intended to replace advice given to you by your health care provider. Make sure you discuss any questions you have with your health care provider.   Document Released: 01/03/2005 Document Revised: 01/24/2014 Document Reviewed: 11/08/2012 Elsevier Interactive Patient Education [employer].

## 2017-07-12 NOTE — Progress Notes (Signed)
Subjective:    Patient ID: Jennifer [name], female    DOB: February 03, 1950, 67 y.o.   MRN: [id number]  CC: Jennifer [name] is a 67 y.o. female who presents today for follow up.   HPI: HTN- labile blood pressures, largley controlled, averages 130/73 to 111/58. A couple 144/89. Denies exertional chest pain or pressure, numbness or tingling radiating to left arm or jaw, palpitations, dizziness, frequent headaches, changes in vision, or shortness of breath.    Occasional insomnia. Takes trazodone prn with relief. Would like refill  Seen 2 weeks ago  for food poisoning at urgent care. Had diarrhea, low grade fever; given augmentin. Symptoms improved. No blood in the stool, diarrhea, vomiting. Dizziness improved.   Would  Like information for nutrition programs in area.  Former ; quit over 15 years ( 1995).   She needs refills of medications today.       HISTORY:  Past Medical History:  Diagnosis Date  . Constipation   . Elevated liver enzymes   . Hemorrhoids   . High cholesterol   . Hyperlipidemia   . Hypertension   . Hypothyroidism    Past Surgical History:  Procedure Laterality Date  . ABDOMINAL HYSTERECTOMY     total for fibroids no h/o abnormal pap  . bariatric sleeve  2015  . BREAST EXCISIONAL BIOPSY Left 1998  . carpal tunnel repair    . COLONOSCOPY WITH PROPOFOL N/A 02/11/2016   Procedure: COLONOSCOPY WITH PROPOFOL;  Surgeon: Jonathon Bellows, MD;  Location: [facility] ENDOSCOPY;  Service: Endoscopy;  Laterality: N/A;  . HEMORRHOID SURGERY     Family History  Problem Relation Age of Onset  . Breast cancer Sister 12       materal 1/2 sister    Allergies: Celecoxib; Pollen extract; Nasacort [triamcinolone]; and Vicodin [hydrocodone-acetaminophen] Current Outpatient Medications on File Prior to Visit  Medication Sig Dispense Refill  . acetaminophen (TYLENOL) 500 MG tablet Take 1,000 mg by mouth every 6 (six) hours as needed.     [name] Kitchen aspirin 81 MG tablet Take 81 mg by mouth  daily.    . Biotin 5000 MCG CAPS Take 5,000 mcg by mouth daily.     . cholecalciferol (VITAMIN D) 1000 UNITS tablet Take 400 Units by mouth daily.     . diclofenac sodium (VOLTAREN) 1 % GEL Apply 4 g topically 4 (four) times daily. 1 Tube 3  . hydrocortisone (ANUSOL-HC) 25 MG suppository Place 1 suppository (25 mg total) rectally 2 (two) times daily as needed for hemorrhoids or anal itching. 12 suppository 2  . lidocaine (LIDODERM) 5 % Place 1 patch onto the skin daily. Remove & Discard patch within 12 hours. 30 patch 0  . Multiple Vitamins-Minerals (MULTIVITAMIN WITH MINERALS) tablet Take 1 tablet by mouth daily.    [name] Kitchen omeprazole (PRILOSEC) 20 MG capsule Take 1 capsule (20 mg total) by mouth daily. 90 capsule 1  . rosuvastatin (CRESTOR) 40 MG tablet TAKE 1 TABLET BY MOUTH EVERY DAY 90 tablet 1  . sodium chloride (OCEAN) 0.65 % SOLN nasal spray Place 1 spray into both nostrils as needed for congestion.     No current facility-administered medications on file prior to visit.     Social History   Tobacco Use  . Smoking status: Former Research scientist (life sciences)  . Smokeless tobacco: Never Used  . Tobacco comment: quit 1995.   Substance Use Topics  . Alcohol use: Yes    Alcohol/week: 8.4 oz    Types: 14 Glasses of wine per week  .  Drug use: No    Review of Systems  Constitutional: Negative for chills and fever.  Respiratory: Negative for cough.   Cardiovascular: Negative for chest pain and palpitations.  Gastrointestinal: Negative for abdominal distention, abdominal pain, blood in stool, constipation, diarrhea (resolved), nausea and vomiting.  Neurological: Negative for dizziness.  Psychiatric/Behavioral: Positive for sleep disturbance. The patient is not nervous/anxious.       Objective:    BP 128/78 (BP Location: Left Arm, Patient Position: Sitting, Cuff Size: Normal)   Pulse 80   Temp 98.2 F (36.8 C) (Oral)   Wt 193 lb 8 oz (87.8 kg)   SpO2 98%   BMI 36.56 kg/m  BP Readings from Last 3  Encounters:  07/12/17 128/78  06/05/17 140/80  03/27/17 (!) 108/58   Wt Readings from Last 3 Encounters:  07/12/17 193 lb 8 oz (87.8 kg)  06/05/17 191 lb 6 oz (86.8 kg)  03/27/17 197 lb 8 oz (89.6 kg)    Physical Exam  Constitutional: She appears well-developed and well-nourished.  Eyes: Conjunctivae are normal.  Cardiovascular: Normal rate, regular rhythm, normal heart sounds and normal pulses.  Pulmonary/Chest: Effort normal and breath sounds normal. She has no wheezes. She has no rhonchi. She has no rales.  Neurological: She is alert.  Skin: Skin is warm and dry.  Psychiatric: She has a normal mood and affect. Her speech is normal and behavior is normal. Thought content normal.  Vitals reviewed.      Assessment & Plan:   Problem List Items Addressed This Visit      Cardiovascular and Mediastinum   Essential hypertension - Primary    Controlled. Overall improved with reduction of dietary salt. Continue regimen. Will continue to monitor at home and consider beta blocker if blood pressure elevated.       Relevant Medications   losartan (COZAAR) 100 MG tablet     Endocrine   Hypothyroidism   Relevant Medications   levothyroxine (SYNTHROID, LEVOTHROID) 25 MCG tablet     Musculoskeletal and Integument   Arthritis     Other   Routine physical examination   Relevant Medications   acyclovir (ZOVIRAX) 400 MG tablet   traZODone (DESYREL) 50 MG tablet   Insomnia    Stable. Continue prn trazodone      Relevant Medications   traZODone (DESYREL) 50 MG tablet       I have discontinued Jennifer [name]'s metaxalone. I have also changed her losartan and levothyroxine. Additionally, I am having her maintain her Biotin, aspirin, cholecalciferol, acetaminophen, multivitamin with minerals, sodium chloride, hydrocortisone, rosuvastatin, omeprazole, diclofenac sodium, lidocaine, acyclovir, and traZODone.   Meds ordered this encounter  Medications  . losartan (COZAAR) 100 MG  tablet    Sig: Take 1 tablet (100 mg total) by mouth daily.    Dispense:  90 tablet    Refill:  1  . levothyroxine (SYNTHROID, LEVOTHROID) 25 MCG tablet    Sig: Take 1 tablet (25 mcg total) by mouth daily.    Dispense:  90 tablet    Refill:  1  . acyclovir (ZOVIRAX) 400 MG tablet    Sig: Take 1 tablet (400 mg total) by mouth daily.    Dispense:  90 tablet    Refill:  1  . traZODone (DESYREL) 50 MG tablet    Sig: Take 1 tablet (50 mg total) by mouth at bedtime.    Dispense:  90 tablet    Refill:  1    Return precautions given.   Risks,  benefits, and alternatives of the medications and treatment plan prescribed today were discussed, and patient expressed understanding.   Education regarding symptom management and diagnosis given to patient on AVS.  Continue to follow with [name], FNP for routine health maintenance.   Jennifer [name] and I agreed with plan.   [name], FNP

## 2017-07-19 ENCOUNTER — Ambulatory Visit: Payer: Medicare Other

## 2017-08-03 ENCOUNTER — Ambulatory Visit: Payer: Medicare Other

## 2017-08-03 DIAGNOSIS — K760 Fatty (change of) liver, not elsewhere classified: Secondary | ICD-10-CM | POA: Diagnosis not present

## 2017-08-03 DIAGNOSIS — K2990 Gastroduodenitis, unspecified, without bleeding: Secondary | ICD-10-CM | POA: Diagnosis not present

## 2017-08-31 ENCOUNTER — Ambulatory Visit (INDEPENDENT_AMBULATORY_CARE_PROVIDER_SITE_OTHER): Payer: Medicare Other

## 2017-08-31 ENCOUNTER — Ambulatory Visit: Payer: Medicare Other

## 2017-08-31 VITALS — BP 134/82 | HR 88 | Resp 15 | Ht 62.2 in | Wt 198.0 lb

## 2017-08-31 DIAGNOSIS — Z0000 Encounter for general adult medical examination without abnormal findings: Secondary | ICD-10-CM | POA: Diagnosis not present

## 2017-08-31 NOTE — Patient Instructions (Addendum)
  Jennifer [name] , Thank you for taking time to come for your Medicare Wellness Visit. I appreciate your ongoing commitment to your health goals. Please review the following plan we discussed and let me know if I can assist you in the future.   Follow up as needed.    Bring a copy of your [place] and/or Living Will to be scanned into chart.  Have a great day!  These are the goals we discussed: Goals    . DIET - INCREASE LEAN PROTEINS     Low carb diet    . Increase physical activity     Exercise 150 minutes per week       This is a list of the screening recommended for you and due dates:  Health Maintenance  Topic Date Due  . Flu Shot  08/17/2017  . Colon Cancer Screening  02/11/2019  . Mammogram  02/23/2019  . Tetanus Vaccine  02/06/2024  . DEXA scan (bone density measurement)  Completed  .  Hepatitis C: One time screening is recommended by Center for Disease Control  (CDC) for  adults born from [birth year] through 1966.   Completed  . Pneumonia vaccines  Completed

## 2017-08-31 NOTE — Progress Notes (Addendum)
Subjective:   Jennifer [name] is a 67 y.o. female who presents for an Initial Medicare Annual Wellness Visit.  Review of Systems    No ROS.  Medicare Wellness Visit. Additional risk factors are reflected in the social history.  Cardiac Risk Factors include: advanced age (>41men, >49 women);hypertension;obesity (BMI >30kg/m2)     Objective:    Today's Vitals   08/31/17 1042  BP: 134/82  Pulse: 88  Resp: 15  SpO2: 95%  Weight: 198 lb (89.8 kg)  Height: 5' 2.2" (1.58 m)   Body mass index is 35.98 kg/m.  Advanced Directives 08/31/2017 09/03/2014  Does Patient Have a Medical Advance Directive? Yes No  Type of Advance Directive Living will;Healthcare Power of Attorney -  Does patient want to make changes to medical advance directive? No - Patient declined -  Copy of [place] in Chart? No - copy requested -  Would patient like information on creating a medical advance directive? - Yes - Educational materials given    Current Medications (verified) Outpatient Encounter Medications as of 08/31/2017  Medication Sig  . acetaminophen (TYLENOL) 500 MG tablet Take 1,000 mg by mouth every 6 (six) hours as needed.   [name] Kitchen acyclovir (ZOVIRAX) 400 MG tablet Take 1 tablet (400 mg total) by mouth daily.  [name] Kitchen aspirin 81 MG tablet Take 81 mg by mouth daily.  . Biotin 5000 MCG CAPS Take 5,000 mcg by mouth daily.   . cholecalciferol (VITAMIN D) 1000 UNITS tablet Take 400 Units by mouth daily.   . diclofenac sodium (VOLTAREN) 1 % GEL Apply 4 g topically 4 (four) times daily.  . hydrocortisone (ANUSOL-HC) 25 MG suppository Place 1 suppository (25 mg total) rectally 2 (two) times daily as needed for hemorrhoids or anal itching.  . levothyroxine (SYNTHROID, LEVOTHROID) 25 MCG tablet Take 1 tablet (25 mcg total) by mouth daily.  [name] Kitchen lidocaine (LIDODERM) 5 % Place 1 patch onto the skin daily. Remove & Discard patch within 12 hours.  [name] Kitchen losartan (COZAAR) 100 MG tablet Take 1 tablet (100 mg  total) by mouth daily.  . Multiple Vitamins-Minerals (MULTIVITAMIN WITH MINERALS) tablet Take 1 tablet by mouth daily.  [name] Kitchen omeprazole (PRILOSEC) 20 MG capsule Take 1 capsule (20 mg total) by mouth daily.  . rosuvastatin (CRESTOR) 40 MG tablet TAKE 1 TABLET BY MOUTH EVERY DAY  . sodium chloride (OCEAN) 0.65 % SOLN nasal spray Place 1 spray into both nostrils as needed for congestion.  . traZODone (DESYREL) 50 MG tablet Take 1 tablet (50 mg total) by mouth at bedtime.   No facility-administered encounter medications on file as of 08/31/2017.     Allergies (verified) Celecoxib; Pollen extract; Nasacort [triamcinolone]; and Vicodin [hydrocodone-acetaminophen]   History: Past Medical History:  Diagnosis Date  . Constipation   . Elevated liver enzymes   . Hemorrhoids   . High cholesterol   . Hyperlipidemia   . Hypertension   . Hypothyroidism    Past Surgical History:  Procedure Laterality Date  . ABDOMINAL HYSTERECTOMY     total for fibroids no h/o abnormal pap  . bariatric sleeve  2015  . BREAST EXCISIONAL BIOPSY Left 1998  . carpal tunnel repair    . COLONOSCOPY WITH PROPOFOL N/A 02/11/2016   Procedure: COLONOSCOPY WITH PROPOFOL;  Surgeon: Jonathon Bellows, MD;  [facility] END[facility]COPY;  Service: Endoscopy;  Laterality: N/A;  . HEMORRHOID SURGERY     Family History  Problem Relation Age of Onset  . Breast cancer Sister 27  materal 1/2 sister   Social History   Socioeconomic History  . Marital status: Married    Spouse name: Not on file  . Number of children: Not on file  . Years of education: Not on file  . Highest education level: Not on file  Occupational History  . Not on file  Social Needs  . Financial resource strain: Not hard at all  . Food insecurity:    Worry: Never true    Inability: Never true  . Transportation needs:    Medical: No    Non-medical: No  Tobacco Use  . Smoking status: Former Research scientist (life sciences)  . Smokeless tobacco: Never Used  . Tobacco comment: quit  1995.   Substance and Sexual Activity  . Alcohol use: Yes    Alcohol/week: 14.0 standard drinks    Types: 14 Glasses of wine per week  . Drug use: No  . Sexual activity: Not on file  Lifestyle  . Physical activity:    Days per week: Not on file    Minutes per session: Not on file  . Stress: Not on file  Relationships  . Social connections:    Talks on phone: Not on file    Gets together: Not on file    Attends religious service: Not on file    Active member of club or organization: Not on file    Attends meetings of clubs or organizations: Not on file    Relationship status: Not on file  Other Topics Concern  . Not on file  Social History Narrative   Lives in [place].    Married.    Retired 2015, Interior and spatial designer.    One son; granddaughter.     Tobacco Counseling Counseling given: Not Answered Comment: quit 1995.    Clinical Intake:  Pre-visit preparation completed: Yes  Pain : No/denies pain     Nutritional Status: BMI > 30  Obese Diabetes: No  How often do you need to have someone help you when you read instructions, pamphlets, or other written materials from your doctor or pharmacy?: 1 - Never  Interpreter Needed?: No      Activities of Daily Living In your present state of health, do you have any difficulty performing the following activities: 08/31/2017  Hearing? N  Vision? N  Difficulty concentrating or making decisions? N  Walking or climbing stairs? N  Dressing or bathing? N  Doing errands, shopping? N  Preparing Food and eating ? N  Using the Toilet? N  In the past six months, have you accidently leaked urine? N  Do you have problems with loss of bowel control? N  Managing your Medications? N  Managing your Finances? N  Housekeeping or managing your Housekeeping? N  Some recent data might be hidden     Immunizations and Health Maintenance Immunization History  Administered Date(s) Administered  . Hep A / Hep B 05/24/2013,  11/21/2013  . Influenza Split 10/21/2013  . Influenza, High Dose Seasonal PF 10/24/2016  . Influenza,inj,Quad PF,6+ Mos 11/08/2013, 10/28/2014  . Influenza-Unspecified 12/05/2011, 10/28/2015, 10/24/2016  . Pneumococcal Conjugate-13 12/05/2014, 04/06/2016  . Pneumococcal Polysaccharide-23 06/05/2017  . Tdap 02/05/2014  . Zoster 01/09/2012, 12/05/2014   Health Maintenance Due  Topic Date Due  . INFLUENZA VACCINE  08/17/2017    Patient Care Team: [name], FNP as PCP - General (Family Medicine) Kennith Center, RD as Dietitian (Family Medicine)  Indicate any recent Medical Services you may have received from other than [facility]  providers in the past year (date may be approximate).     Assessment:   This is a routine wellness examination for [place].  The goal of the wellness visit is to assist the patient how to close the gaps in care and create a preventative care plan for the patient.   The roster of all physicians providing medical care to patient is listed in the Snapshot section of the chart.  Taking calcium VIT D as appropriate/Osteoporosis risk reviewed.    Safety issues reviewed; Smoke and carbon monoxide detectors in the home. No firearms in the home. Wears seatbelts when driving or riding with others. No violence in the home.  They do not have excessive sun exposure.  Discussed the need for sun protection: hats, long sleeves and the use of sunscreen if there is significant sun exposure.   Patient is alert, normal appearance, oriented to person/place/and time.  Correctly identified the president of the [place] and recalls of 3/3 words. Performs simple calculations and can read correct time from watch face.  Displays appropriate judgement.  No new identified risk were noted.  No failures at ADL's or IADL's.   BMI- discussed the importance of a healthy diet, water intake and the benefits of aerobic exercise. Educational material provided.   24 hour diet recall: Low  carb diet  Dental- every 6 months.  [facility].  Sleep patterns- Sleeps about 4 hours and CPAP in use.  Health maintenance gaps- closed.  Patient Concerns: None at this time. Follow up with PCP as needed. Hearing/Vision screen Hearing Screening Comments: Patient is able to hear conversational tones without difficulty.  No issues reported.   Vision Screening Comments: Followed by [facility] Wears corrective lenses Vision examined within the last 12 months Visual acuity not assessed per patient preference   Dietary issues and exercise activities discussed: Current Exercise Habits: Home exercise routine, Type of exercise: walking;treadmill, Time (Minutes): 30, Frequency (Times/Week): 2, Weekly Exercise (Minutes/Week): 60  Goals    . DIET - INCREASE LEAN PROTEINS     Low carb diet    . Increase physical activity     Exercise 150 minutes per week      Depression Screen PHQ 2/9 Scores 08/31/2017 03/07/2017 10/03/2016 06/16/2016 04/06/2016 12/08/2015  PHQ - 2 Score 0 0 0 0 0 0    Fall Risk Fall Risk  08/31/2017 03/07/2017 10/03/2016 06/16/2016 04/06/2016  Falls in the past year? No No No No No   Cognitive Function: MMSE - Mini Mental State Exam 08/31/2017  Orientation to time 5  Orientation to Place 5  Registration 3  Attention/ Calculation 5  Recall 3  Language- name 2 objects 2  Language- repeat 1  Language- follow 3 step command 3  Language- read & follow direction 1  Write a sentence 1  Copy design 1  Total score 30       Screening Tests Health Maintenance  Topic Date Due  . INFLUENZA VACCINE  08/17/2017  . COLONOSCOPY  02/11/2019  . MAMMOGRAM  02/23/2019  . TETANUS/TDAP  02/06/2024  . DEXA SCAN  Completed  . Hepatitis C Screening  Completed  . PNA vac Low Risk Adult  Completed     Plan:   End of life planning; Advance aging; Advanced directives discussed. Copy of current HCPOA/Living Will requested.    I have personally reviewed and noted the  following in the patient's chart:   . Medical and social history . Use of alcohol, tobacco or illicit drugs  .  Current medications and supplements . Functional ability and status . Nutritional status . Physical activity . Advanced directives . List of other physicians . Hospitalizations, surgeries, and ER visits in previous 12 months . Vitals . Screenings to include cognitive, depression, and falls . Referrals and appointments  In addition, I have reviewed and discussed with patient certain preventive protocols, quality metrics, and best practice recommendations. A written personalized care plan for preventive services as well as general preventive health recommendations were provided to patient.     OBrien-Blaney, Hatsue Sime L, LPN   1/[id number]/9449      I have reviewed the above information and agree with above.   Deborra Medina, MD

## 2017-09-06 DIAGNOSIS — Z23 Encounter for immunization: Secondary | ICD-10-CM | POA: Diagnosis not present

## 2017-11-14 ENCOUNTER — Telehealth: Payer: Self-pay | Admitting: Family

## 2017-11-14 DIAGNOSIS — I10 Essential (primary) hypertension: Secondary | ICD-10-CM

## 2017-11-14 NOTE — Telephone Encounter (Signed)
Copied from [place] [phone]. Topic: Quick Communication - See Telephone Encounter >> Nov 14, 2017  2:42 PM Rutherford Nail, [place] wrote: CRM for notification. See Telephone encounter for: 11/14/17. Patient calling and states that the losartan (COZAAR) 100 MG tablet was recalled. Would like to know what could be sent in it's place.  CB#: [phone] [id number] [place], [place] - [address] [place]

## 2017-11-15 MED ORDER — LISINOPRIL 10 MG PO TABS: 10.0000 mg | ORAL_TABLET | Freq: Every day | ORAL | 3 refills | Status: DC

## 2017-11-15 NOTE — Telephone Encounter (Signed)
Call patient Discontinue the losartan.  I have sent in lisinopril which is a similar blood pressure medication.  We will have to titrate this medication ; I have started on the lower end with 10 mg once per day.  We may have to double this depending on her blood pressure readings.  It may take up to 1 week for the blood pressure to respond this medication  after 1 week the blood pressure is still persistently greater than 120/80, I would advise that we increase lisinopril to 20 mg once per day.   Furthermore, since we are changing her medications, she would need to have labs done to check electrolytes approximately 5 days after starting lisinopril.    We will also to repeat labs after any subsequent increase of the medication.  I ordered labs.    Please schedule lab with patient.  Patient is also due for follow-up.  Please schedule follow-up appointment.

## 2017-11-16 NOTE — Telephone Encounter (Signed)
Patient advised of below and verbalized understanding.   She will start lisinopril 10mg  and come in for labs in 5 days and continue to monitor blood pressure .

## 2017-11-21 ENCOUNTER — Other Ambulatory Visit (INDEPENDENT_AMBULATORY_CARE_PROVIDER_SITE_OTHER): Payer: Medicare Other

## 2017-11-21 DIAGNOSIS — I10 Essential (primary) hypertension: Secondary | ICD-10-CM

## 2017-11-21 LAB — BASIC METABOLIC PANEL
BUN: 10 mg/dL (ref 6–23)
CO2: 25 mEq/L (ref 19–32)
Calcium: 9.6 mg/dL (ref 8.4–10.5)
Chloride: 104 mEq/L (ref 96–112)
Creatinine, Ser: 1.17 mg/dL (ref 0.40–1.20)
GFR: 59.11 mL/min — ABNORMAL LOW (ref >60.00)
Glucose, Bld: 121 mg/dL — ABNORMAL HIGH (ref 70–99)
Potassium: 4.3 mEq/L (ref 3.5–5.1)
Sodium: 138 mEq/L (ref 135–145)

## 2017-11-27 ENCOUNTER — Other Ambulatory Visit: Payer: Self-pay | Admitting: Family

## 2017-11-27 ENCOUNTER — Ambulatory Visit (INDEPENDENT_AMBULATORY_CARE_PROVIDER_SITE_OTHER): Payer: Medicare Other | Admitting: Family

## 2017-11-27 ENCOUNTER — Encounter: Payer: Self-pay | Admitting: Family

## 2017-11-27 VITALS — BP 140/80 | HR 93 | Temp 98.5°F | Resp 16 | Ht 62.0 in | Wt 198.4 lb

## 2017-11-27 DIAGNOSIS — E039 Hypothyroidism, unspecified: Secondary | ICD-10-CM

## 2017-11-27 DIAGNOSIS — M79671 Pain in right foot: Secondary | ICD-10-CM

## 2017-11-27 DIAGNOSIS — I10 Essential (primary) hypertension: Secondary | ICD-10-CM

## 2017-11-27 LAB — URIC ACID: Uric Acid, Serum: 5.4 mg/dL (ref 2.4–7.0)

## 2017-11-27 LAB — HEMOGLOBIN A1C: Hgb A1c MFr Bld: 6.5 % (ref 4.6–6.5)

## 2017-11-27 LAB — C-REACTIVE PROTEIN: CRP: 0.8 mg/dL (ref 0.5–20.0)

## 2017-11-27 LAB — B12 AND FOLATE PANEL
Folate: 16.5 ng/mL (ref >5.9)
Vitamin B-12: 235 pg/mL (ref 211–911)

## 2017-11-27 LAB — TSH: TSH: 4.69 u[IU]/mL — ABNORMAL HIGH (ref 0.35–4.50)

## 2017-11-27 LAB — SEDIMENTATION RATE: Sed Rate: 30 mm/hr (ref 0–30)

## 2017-11-27 MED ORDER — LISINOPRIL 5 MG PO TABS: 5.0000 mg | ORAL_TABLET | Freq: Every day | ORAL | 3 refills | Status: DC

## 2017-11-27 MED ORDER — LEVOTHYROXINE SODIUM 50 MCG PO TABS: 25.0000 ug | ORAL_TABLET | Freq: Every day | ORAL | 1 refills | Status: DC

## 2017-11-27 MED ORDER — GABAPENTIN 100 MG PO CAPS: 100.0000 mg | ORAL_CAPSULE | Freq: Every day | ORAL | 3 refills | Status: DC

## 2017-11-27 NOTE — Progress Notes (Signed)
Left voicemail to call, [facility] may speak to patient

## 2017-11-27 NOTE — Patient Instructions (Addendum)
Start gabapentin.  Advised discussed that the most common side effect is drowsiness.  We will titrate this medication up slowly.  Do not take with alcohol.  Do not drive on this medication until we know how you feel on this. Follow-up in 3 months, sooner if needed.   Neuropathic Pain Neuropathic pain is pain caused by damage to the nerves that are responsible for certain sensations in your body (sensory nerves). The pain can be caused by damage to:  The sensory nerves that send signals to your spinal cord and brain (peripheral nervous system).  The sensory nerves in your brain or spinal cord (central nervous system).  Neuropathic pain can make you more sensitive to pain. What would be a minor sensation for most people may feel very painful if you have neuropathic pain. This is usually a long-term condition that can be difficult to treat. The type of pain can differ from person to person. It may start suddenly (acute), or it may develop slowly and last for a long time (chronic). Neuropathic pain may come and go as damaged nerves heal or may stay at the same level for years. It often causes emotional distress, loss of sleep, and a lower quality of life. What are the causes? The most common cause of damage to a sensory nerve is diabetes. Many other diseases and conditions can also cause neuropathic pain. Causes of neuropathic pain can be classified as:  Toxic. Many drugs and chemicals can cause toxic damage. The most common cause of toxic neuropathic pain is damage from drug treatment for cancer (chemotherapy).  Metabolic. This type of pain can happen when a disease causes imbalances that damage nerves. Diabetes is the most common of these diseases. Vitamin B deficiency caused by long-term alcohol abuse is another common cause.  Traumatic. Any injury that cuts, crushes, or stretches a nerve can cause damage and pain. A common example is feeling pain after losing an arm or leg (phantom limb  pain).  Compression-related. If a sensory nerve gets trapped or compressed for a long period of time, the blood supply to the nerve can be cut off.  Vascular. Many blood vessel diseases can cause neuropathic pain by decreasing blood supply and oxygen to nerves.  Autoimmune. This type of pain results from diseases in which the body's defense system mistakenly attacks sensory nerves. Examples of autoimmune diseases that can cause neuropathic pain include lupus and multiple sclerosis.  Infectious. Many types of viral infections can damage sensory nerves and cause pain. Shingles infection is a common cause of this type of pain.  Inherited. Neuropathic pain can be a symptom of many diseases that are passed down through families (genetic).  What are the signs or symptoms? The main symptom is pain. Neuropathic pain is often described as:  Burning.  Shock-like.  Stinging.  Hot or cold.  Itching.  How is this diagnosed? No single test can diagnose neuropathic pain. Your health care provider will do a physical exam and ask you about your pain. You may use a pain scale to describe how bad your pain is. You may also have tests to see if you have a high sensitivity to pain and to help find the cause and location of any sensory nerve damage. These tests may include:  Imaging studies, such as: ? X-rays. ? CT scan. ? MRI.  Nerve conduction studies to test how well nerve signals travel through your sensory nerves (electrodiagnostic testing).  Stimulating your sensory nerves through electrodes on your skin  and measuring the response in your spinal cord and brain (somatosensory evoked potentials).  How is this treated? Treatment for neuropathic pain may change over time. You may need to try different treatment options or a combination of treatments. Some options include:  Over-the-counter pain relievers.  Prescription medicines. Some medicines used to treat other conditions may also help  neuropathic pain. These include medicines to: ? Control seizures (anticonvulsants). ? Relieve depression (antidepressants).  Prescription-strength pain relievers (narcotics). These are usually used when other pain relievers do not help.  Transcutaneous nerve stimulation (TENS). This uses electrical currents to block painful nerve signals. The treatment is painless.  Topical and local anesthetics. These are medicines that numb the nerves. They can be injected as a nerve block or applied to the skin.  Alternative treatments, such as: ? Acupuncture. ? Meditation. ? Massage. ? Physical therapy. ? Pain management programs. ? Counseling.  Follow these instructions at home:  Learn as much as you can about your condition.  Take medicines only as directed by your health care provider.  Work closely with all your health care providers to find what works best for you.  Have a good support system at home.  Consider joining a chronic pain support group. Contact a health care provider if:  Your pain treatments are not helping.  You are having side effects from your medicines.  You are struggling with fatigue, mood changes, depression, or anxiety. This information is not intended to replace advice given to you by your health care provider. Make sure you discuss any questions you have with your health care provider. Document Released: 10/01/2003 Document Revised: 07/24/2015 Document Reviewed: 06/13/2013 Elsevier Interactive Patient Education  [employer].

## 2017-11-27 NOTE — Progress Notes (Signed)
Subjective:    Patient ID: Jennifer [name], female    DOB: 15-Dec-1950, 67 y.o.   MRN: [id number]  CC: Jennifer [name] is a 67 y.o. female who presents today for an acute visit.    HPI: Right toe and foot pain x one week, waxes and wanes.  Started while laying in bed. Primarily numb in great toe. Thinks top of foot looks puffy.   Not SOB. No calf swelling. Legs are not moving at night.     HISTORY:  Past Medical History:  Diagnosis Date  . Constipation   . Elevated liver enzymes   . Hemorrhoids   . High cholesterol   . Hyperlipidemia   . Hypertension   . Hypothyroidism    Past Surgical History:  Procedure Laterality Date  . ABDOMINAL HYSTERECTOMY     total for fibroids no h/o abnormal pap  . bariatric sleeve  2015  . BREAST EXCISIONAL BIOPSY Left 1998  . carpal tunnel repair    . COLONOSCOPY WITH PROPOFOL N/A 02/11/2016   Procedure: COLONOSCOPY WITH PROPOFOL;  Surgeon: Jennifer Bellows, MD;  Location: [facility] ENDOSCOPY;  Service: Endoscopy;  Laterality: N/A;  . HEMORRHOID SURGERY     Family History  Problem Relation Age of Onset  . Breast cancer Sister 14       materal 1/2 sister    Allergies: Celecoxib; Pollen extract; Nasacort [triamcinolone]; and Vicodin [hydrocodone-acetaminophen] Current Outpatient Medications on File Prior to Visit  Medication Sig Dispense Refill  . acetaminophen (TYLENOL) 500 MG tablet Take 1,000 mg by mouth every 6 (six) hours as needed.     [name] Kitchen acyclovir (ZOVIRAX) 400 MG tablet Take 1 tablet (400 mg total) by mouth daily. 90 tablet 1  . aspirin 81 MG tablet Take 81 mg by mouth daily.    . Biotin 5000 MCG CAPS Take 5,000 mcg by mouth daily.     . cholecalciferol (VITAMIN D) 1000 UNITS tablet Take 400 Units by mouth daily.     . hydrocortisone (ANUSOL-HC) 25 MG suppository Place 1 suppository (25 mg total) rectally 2 (two) times daily as needed for hemorrhoids or anal itching. 12 suppository 2  . levothyroxine (SYNTHROID, LEVOTHROID) 25 MCG tablet  Take 1 tablet (25 mcg total) by mouth daily. 90 tablet 1  . lisinopril (PRINIVIL,ZESTRIL) 10 MG tablet Take 1 tablet (10 mg total) by mouth daily. 90 tablet 3  . Multiple Vitamins-Minerals (MULTIVITAMIN WITH MINERALS) tablet Take 1 tablet by mouth daily.    [name] Kitchen omeprazole (PRILOSEC) 20 MG capsule Take 1 capsule (20 mg total) by mouth daily. 90 capsule 1  . rosuvastatin (CRESTOR) 40 MG tablet TAKE 1 TABLET BY MOUTH EVERY DAY 90 tablet 1  . sodium chloride (OCEAN) 0.65 % SOLN nasal spray Place 1 spray into both nostrils as needed for congestion.    . traZODone (DESYREL) 50 MG tablet Take 1 tablet (50 mg total) by mouth at bedtime. 90 tablet 1  . diclofenac sodium (VOLTAREN) 1 % GEL Apply 4 g topically 4 (four) times daily. (Patient not taking: Reported on 11/27/2017) 1 Tube 3   No current facility-administered medications on file prior to visit.     Social History   Tobacco Use  . Smoking status: Former Research scientist (life sciences)  . Smokeless tobacco: Never Used  . Tobacco comment: quit 1995.   Substance Use Topics  . Alcohol use: Yes    Alcohol/week: 14.0 standard drinks    Types: 14 Glasses of wine per week  . Drug use: No  Review of Systems  Constitutional: Negative for chills and fever.  Respiratory: Negative for cough.   Cardiovascular: Positive for leg swelling. Negative for chest pain and palpitations.  Gastrointestinal: Negative for nausea and vomiting.  Skin: Negative for color change and wound.  Neurological: Positive for numbness.      Objective:    BP 140/80 (BP Location: Left Arm, Patient Position: Sitting, Cuff Size: Normal)   Pulse 93   Temp 98.5 F (36.9 C) (Oral)   Resp 16   Ht 5\' 2"  (1.575 m)   Wt 198 lb 6 oz (90 kg)   SpO2 96%   BMI 36.28 kg/m    Physical Exam  Constitutional: She appears well-developed and well-nourished.  Eyes: Conjunctivae are normal.  Cardiovascular: Normal rate, regular rhythm, normal heart sounds and normal pulses.  No LE edema, palpable cords  or masses. No erythema or increased warmth. No asymmetry in calf size when compared bilaterally LE hair growth symmetric and present. No discoloration of varicosities noted. LE warm and palpable pedal pulses.   Pulmonary/Chest: Effort normal and breath sounds normal. She has no wheezes. She has no rhonchi. She has no rales.  Musculoskeletal:       Right ankle: She exhibits normal range of motion and no swelling. No tenderness.       Left ankle: She exhibits normal range of motion and no swelling. No tenderness.       Right foot: There is normal range of motion and no tenderness.       Left foot: There is normal range of motion and no tenderness.  Neurological: She is alert.  Normal microfilament exam.    Skin: Skin is warm and dry.  Psychiatric: She has a normal mood and affect. Her speech is normal and behavior is normal. Thought content normal.  Vitals reviewed.      Assessment & Plan:   1. Right foot pain Symptoms most consistent with peripheral neuropathy.  Pending lab evaluation.  Start gabapentin.  Close follow-up. - TSH - Hemoglobin A1c - B12 and Folate Panel - gabapentin (NEURONTIN) 100 MG capsule; Take 1 capsule (100 mg total) by mouth at bedtime.  Dispense: 90 capsule; Refill: 3 - Uric acid - Sedimentation rate - C-reactive protein   I have discontinued Jennifer [name]'s lidocaine. I am also having her start on gabapentin. Additionally, I am having her maintain her Biotin, aspirin, cholecalciferol, acetaminophen, multivitamin with minerals, sodium chloride, hydrocortisone, rosuvastatin, omeprazole, diclofenac sodium, levothyroxine, acyclovir, traZODone, and lisinopril.   Meds ordered this encounter  Medications  . gabapentin (NEURONTIN) 100 MG capsule    Sig: Take 1 capsule (100 mg total) by mouth at bedtime.    Dispense:  90 capsule    Refill:  3    Order Specific Question:   Supervising Provider    Answer:   Jennifer [name] [[id number]]    Return precautions  given.   Risks, benefits, and alternatives of the medications and treatment plan prescribed today were discussed, and patient expressed understanding.   Education regarding symptom management and diagnosis given to patient on AVS.  Continue to follow with [name], FNP for routine health maintenance.   Jennifer [name] and I agreed with plan.   [name], FNP

## 2017-11-27 NOTE — Progress Notes (Signed)
Call patient I meant discuss her blood pressure today when she was here.  Her blood pressures consistently been running in the 140s.  I would like for her to increase her lisinopril from 10 mg once a day to 15 mg once a day.  I went ahead and sent an additional 5 mg tablet to her pharmacy.  She would need labs done approximately 5 days after starting this increase.  Please ensure you schedule lab appointment.

## 2017-11-28 ENCOUNTER — Telehealth: Payer: Self-pay

## 2017-11-28 NOTE — Progress Notes (Addendum)
Call pt  BP guidelines are < 120/80 which she is not consistently meeting. There is one low BP 107/55 however it appears to be an outlyer. She may wait until she comes in for f/u and we can discuss then.   However I still feel a trial of increased lisinopril appropriate.     Patient advised of below, She states she is hesistant to increase blood pressure.   medication. Please advise  11/23/17 152/84 11:00am 11/24/17 144/85 10:00am Sat 11/9/19107/55 10:00 am Sun 11/26/17-131/77 8:00 am Mon 11/27/17-129/769:00 am  Mon  127/74 1100 am  Please advise

## 2017-11-28 NOTE — Telephone Encounter (Signed)
Neurontin approved pharmacy advised

## 2017-11-29 ENCOUNTER — Telehealth: Payer: Self-pay | Admitting: Family

## 2017-11-29 NOTE — Progress Notes (Signed)
Spoke with patient and advised her of below . She states she does not wish to increase lisinopril at this time and wants to continue to monitor blood pressure.   If she see's that blood pressure stays elevated she will call back and let us know.   Wants to discuss at follow up appointment

## 2017-11-29 NOTE — Telephone Encounter (Signed)
Neurontin authorized 10/29/17-11/28/18, patient advised

## 2017-11-29 NOTE — Telephone Encounter (Signed)
Copied from [place] [id number]. Topic: General - Other >> Nov 29, 2017  4:35 PM Mcneil, Ja-Kwan wrote: Reason for CRM: Pt states she is experiencing allergy related issues with watery itchy eyes and she would like to know what OTC medication if any that she could take for relief. Pt requests call back. Cb# [phone]

## 2017-11-29 NOTE — Telephone Encounter (Signed)
Attempted to CB patient; left VM .

## 2017-11-30 NOTE — Telephone Encounter (Signed)
Pt stated every year that she c/o eye drainage, itchy eyes and irritation. Pt stated that the tears cause irritation to the corners of her eyes and around the eyes. Pt thinks it could be from her CPAP/nasal mask. Pt stated that she washes the CPAP tubing every week and the nasal mask every day. Pt has tried Similasan allergy eye drops and Equate allergy relief 1 time per day. Pt has a cough from the drainage to the back of her throat. Pt has been put on Prednisone in January for similar symptoms.

## 2017-11-30 NOTE — Telephone Encounter (Signed)
Left voice mail for patient to call.

## 2017-11-30 NOTE — Telephone Encounter (Signed)
This encounter was created in error - please disregard.

## 2017-11-30 NOTE — Telephone Encounter (Signed)
Office visit scheduled  See below message

## 2017-12-01 ENCOUNTER — Encounter: Payer: Self-pay | Admitting: Family Medicine

## 2017-12-01 ENCOUNTER — Ambulatory Visit (INDEPENDENT_AMBULATORY_CARE_PROVIDER_SITE_OTHER): Payer: Medicare Other | Admitting: Family Medicine

## 2017-12-01 VITALS — BP 142/78 | HR 84 | Temp 98.9°F | Ht 61.5 in | Wt 198.4 lb

## 2017-12-01 DIAGNOSIS — J309 Allergic rhinitis, unspecified: Secondary | ICD-10-CM | POA: Diagnosis not present

## 2017-12-01 DIAGNOSIS — J3489 Other specified disorders of nose and nasal sinuses: Secondary | ICD-10-CM

## 2017-12-01 DIAGNOSIS — R6889 Other general symptoms and signs: Secondary | ICD-10-CM | POA: Diagnosis not present

## 2017-12-01 MED ORDER — NAPHAZOLINE-PHENIRAMINE 0.025-0.3 % OP SOLN: 1.0000 [drp] | Freq: Four times a day (QID) | OPHTHALMIC | 1 refills | Status: DC | PRN

## 2017-12-01 MED ORDER — FLUTICASONE PROPIONATE 50 MCG/ACT NA SUSP: 1.0000 | Freq: Every day | NASAL | 2 refills | Status: DC

## 2017-12-01 MED ORDER — FEXOFENADINE HCL 180 MG PO TABS: 180.0000 mg | ORAL_TABLET | Freq: Every day | ORAL | 3 refills | Status: DC

## 2017-12-01 NOTE — Progress Notes (Signed)
Subjective:    Patient ID: Jennifer [name], female    DOB: [birth year], 67 y.o.   MRN: [id number]  HPI  Presents to clinic complaining of bilateral eye irritation/itchy and sinus pressure/drainage for a few weeks.  Patient denies any thick discharge from eyes or waking up with her eyes matted together.  States she will have itchiness in her eyes, and sometimes a little bit of white crusty discharge on the outer corners when she wakes up in the morning. Denies blurred or loss of vision.  Patient also states sinus pressure has been present for a couple of weeks with clear nasal discharge.  Patient has gotten an over-the-counter allergy medication from [facility], unsure of exact name of this medicine.  Patient states his medication has not been that helpful to help her symptoms.  Denies fever or chills.  Denies cough, SOB or wheeze. Denies thick or discolored phlegm or nasal drainage.  She does wear nose-piece CPAP machine, feels this has made her congestion worse.   Patient Active Problem List   Diagnosis Date Noted  . Insomnia 07/12/2017  . Constipation 12/15/2016  . Hemorrhoids 12/15/2016  . Hypothyroidism 10/03/2016  . Bradycardia 06/16/2016  . Elevated liver enzymes 04/06/2016  . OSA (obstructive sleep apnea) 04/06/2016  . Fatty liver 12/08/2015  . Arthritis 12/08/2015  . History of bariatric surgery 12/07/2015  . Genital herpes 11/11/2015  . Hyperlipidemia 11/11/2015  . Essential hypertension 11/11/2015  . Routine physical examination 11/11/2015  . Allergic rhinitis 11/11/2015  . GERD (gastroesophageal reflux disease) 11/11/2015  . Impaired fasting glucose 07/17/2012  . Obesity, unspecified 07/17/2012   Social History   Tobacco Use  . Smoking status: Former Research scientist (life sciences)  . Smokeless tobacco: Never Used  . Tobacco comment: quit 1995.   Substance Use Topics  . Alcohol use: Yes    Alcohol/week: 14.0 standard drinks    Types: 14 Glasses of wine per week   Review of  Systems  Constitutional: Negative for chills, fatigue and fever.  HENT: +eye drainage/irriatation, sinus pressure/drainage Eyes: Negative.   Respiratory: Negative for cough, shortness of breath and wheezing.   Cardiovascular: Negative for chest pain, palpitations and leg swelling.  Gastrointestinal: Negative for abdominal pain, diarrhea, nausea and vomiting.  Genitourinary: Negative for dysuria, frequency and urgency.  Musculoskeletal: Negative for arthralgias and myalgias.  Skin: Negative for color change, pallor and rash.  Neurological: Negative for syncope, light-headedness and headaches.  Psychiatric/Behavioral: The patient is not nervous/anxious.       Objective:   Physical Exam  Constitutional: She is oriented to person, place, and time. No distress.  HENT:  Head: Normocephalic and atraumatic.  Nose: Mucosal edema and rhinorrhea present.  Clear nasal drainage  Eyes: Pupils are equal, round, and reactive to light. Conjunctivae and EOM are normal. Right eye exhibits no discharge. Left eye exhibits no discharge. No scleral icterus.  Neck: Neck supple. No tracheal deviation present.  Cardiovascular: Normal rate and regular rhythm.  Pulmonary/Chest: Effort normal and breath sounds normal. No respiratory distress. She has no wheezes. She has no rales.  Musculoskeletal: She exhibits no edema.  Gait normal  Neurological: She is alert and oriented to person, place, and time.  Skin: Skin is warm and dry. She is not diaphoretic. No pallor.  Psychiatric: She has a normal mood and affect. Her behavior is normal.  Nursing note and vitals reviewed.     Vitals:   12/01/17 0957  BP: (!) 142/78  Pulse: 84  Temp: 98.9 F (37.2 C)  SpO2: 93%   Assessment & Plan:    A total of 15 minutes were spent face-to-face with the patient during this encounter and over half of that time was spent on counseling and coordination of care. The patient was counseled on cause of her symptoms,  reassurance it is not bacterial infection.   Allergic rhinitis/rhinorrhea/itchy eyes- patient has no discolored discharge or crusting around the eyes thus I do not suspect any sort of bacterial conjunctivitis.  Nasal drainage is clear in color, so I also do not suspect a sinus infection.  I feel all of her symptoms are related to allergies, she will begin Allegra 180 mg once per day, Flonase nasal spray 1 spray each nostril once daily and also allergy eyedrop 1 drop in each eye every 4 hours as needed for itchy/irritated eyes.  Patient advised to be sure to keep up good fluid intake, get plenty of rest and do good handwashing.  Patient will keep regularly scheduled follow-up with PCP.  Advised return to clinic sooner if issues arise or if current symptoms persist or worsen

## 2017-12-06 ENCOUNTER — Telehealth: Payer: Self-pay | Admitting: Family

## 2017-12-06 ENCOUNTER — Ambulatory Visit (INDEPENDENT_AMBULATORY_CARE_PROVIDER_SITE_OTHER): Payer: Medicare Other | Admitting: Family Medicine

## 2017-12-06 ENCOUNTER — Encounter: Payer: Self-pay | Admitting: Family Medicine

## 2017-12-06 VITALS — BP 156/88 | HR 80 | Temp 98.8°F | Ht 61.5 in | Wt 203.0 lb

## 2017-12-06 DIAGNOSIS — K130 Diseases of lips: Secondary | ICD-10-CM

## 2017-12-06 DIAGNOSIS — I10 Essential (primary) hypertension: Secondary | ICD-10-CM

## 2017-12-06 DIAGNOSIS — E039 Hypothyroidism, unspecified: Secondary | ICD-10-CM

## 2017-12-06 NOTE — Progress Notes (Signed)
Subjective:    Patient ID: Jennifer [name], female    DOB: 15-Jul-1950, 67 y.o.   MRN: [id number]  HPI  Presents to clinic c/o spot on left lower lip, patient states she first noticed a spot about 2 months ago.  She has been trying different treatments including Chapstick and Aquaphor, but the area has not improved.  At first thought it was a smoking or sore or dry skin, Aquaphor seem to make the skin moist.  However after the Aquaphor wore off, the dryness returned and skin would peel off of the area.  Patient started to notice more recently that the skin on her lip looked a little darker discolored in that one spot.  This caused her to become concerned.  Patient was a smoker, but quit more than 30 years ago.  Patient does use sunscreen now, but has not always used it.  States she does not always put something on her lips to protect lips from sun.  Patient Active Problem List   Diagnosis Date Noted  . Insomnia 07/12/2017  . Constipation 12/15/2016  . Hemorrhoids 12/15/2016  . Hypothyroidism 10/03/2016  . Bradycardia 06/16/2016  . Elevated liver enzymes 04/06/2016  . OSA (obstructive sleep apnea) 04/06/2016  . Fatty liver 12/08/2015  . Arthritis 12/08/2015  . History of bariatric surgery 12/07/2015  . Genital herpes 11/11/2015  . Hyperlipidemia 11/11/2015  . Essential hypertension 11/11/2015  . Routine physical examination 11/11/2015  . Allergic rhinitis 11/11/2015  . GERD (gastroesophageal reflux disease) 11/11/2015  . Impaired fasting glucose 07/17/2012  . Obesity, unspecified 07/17/2012   Social History   Tobacco Use  . Smoking status: Former Research scientist (life sciences)  . Smokeless tobacco: Never Used  . Tobacco comment: quit 1995.   Substance Use Topics  . Alcohol use: Yes    Alcohol/week: 14.0 standard drinks    Types: 14 Glasses of wine per week   Review of Systems  Constitutional: Negative for chills, fatigue and fever.  HENT: Negative for congestion, ear pain, sinus pain and  sore throat.   Eyes: Negative.   Respiratory: Negative for cough, shortness of breath and wheezing.   Cardiovascular: Negative for chest pain, palpitations and leg swelling.  Gastrointestinal: Negative for abdominal pain, diarrhea, nausea and vomiting.  Genitourinary: Negative for dysuria, frequency and urgency.  Musculoskeletal: Negative for arthralgias and myalgias.  Skin: spot on left lower lip  Neurological: Negative for syncope, light-headedness and headaches.  Psychiatric/Behavioral: The patient is not nervous/anxious.       Objective:   Physical Exam  Constitutional: She is oriented to person, place, and time. No distress.  HENT:  Head: Normocephalic and atraumatic.  Mouth/Throat:    Lesion location and red dot on diagram.  Patient is approximately size of a small pain.  It is medium brown in color, but does stand out against the pink color of patient's lips.  Skin on top of lesion does appear somewhat dry.  Eyes: Conjunctivae and EOM are normal. No scleral icterus.  Neck: Neck supple. No tracheal deviation present.  Cardiovascular: Normal rate and regular rhythm.  Pulmonary/Chest: Effort normal and breath sounds normal.  Musculoskeletal: She exhibits no edema.  Neurological: She is alert and oriented to person, place, and time.  Psychiatric: She has a normal mood and affect. Her behavior is normal.  Nursing note and vitals reviewed.     Vitals:   12/06/17 0819 12/06/17 0835  BP: (!) 168/92 (!) 156/88  Pulse: 80   Temp: 98.8 F (37.1 C)  SpO2: 94%     Assessment & Plan:    Lesion of Lip - we will do urgent referral to dermatology for evaluation and possible biopsy.  Patient aware that my back-up plan for evaluation of the area is plastic surgery if dermatology has a long wait for appointments.  Patient she may continue to use Aquaphor to help keep skin soft and smooth.  Essential hypertension-patient's blood pressure is somewhat elevated today.  She will keep a  close eye on her BP readings, if they continue to trend high we will have to make medication adjustments.  BP at previous visit was 140s/70s. Currently on lisinopril 5 mg once daily.  Keep upcoming lab appt as planned.   Follow-up in approximately 6 to 8 weeks for management of chronic medical conditions.  Return to clinic sooner if any issues arise or if your BP continues to trend high at home while monitoring.

## 2017-12-06 NOTE — Telephone Encounter (Signed)
Copied from [place] [phone] [id number]. Topic: Quick Communication - See Telephone Encounter >> Dec 06, 2017  1:53 PM Hewitt Shorts wrote: Pt has questions in regards to the millligrams of the levothyroxine she believes it should be 50mg  and the way that the pharmacy gaave the directions of cutting the 50 mg in half  She feels that she should be taking 25 mg again and needs to know which is right   Best number [phone]

## 2017-12-07 NOTE — Telephone Encounter (Signed)
Called pt to clarify the dosage of the levothyroxine. She did pick up the 50 mcg tab but according to the directions on the bottle it is telling her to cut this tablet in half, taking 25 mcg. According to the lab note, she should be taking the whole tab (50 mcg). Pt also thinks she should be taking the whole tab. Please review this med and notify the patient of how she needs to take this medication. New label should be printed from the pharmacy so to avoid future confusion.

## 2017-12-08 MED ORDER — LEVOTHYROXINE SODIUM 50 MCG PO TABS: 50.0000 ug | ORAL_TABLET | Freq: Every day | ORAL | 1 refills | Status: DC

## 2017-12-08 NOTE — Telephone Encounter (Signed)
Patient advised script for synthroid 50 mcg sent to pharmacy. She will pick up new script

## 2017-12-08 NOTE — Telephone Encounter (Signed)
Call pt She is on 59mcg synthroid I sent in correct rx to pharmacy

## 2017-12-19 ENCOUNTER — Other Ambulatory Visit: Payer: Self-pay | Admitting: Family

## 2017-12-19 DIAGNOSIS — E785 Hyperlipidemia, unspecified: Secondary | ICD-10-CM

## 2018-01-08 ENCOUNTER — Other Ambulatory Visit: Payer: Self-pay | Admitting: Family

## 2018-01-08 ENCOUNTER — Other Ambulatory Visit (INDEPENDENT_AMBULATORY_CARE_PROVIDER_SITE_OTHER): Payer: Medicare Other

## 2018-01-08 DIAGNOSIS — I10 Essential (primary) hypertension: Secondary | ICD-10-CM | POA: Diagnosis not present

## 2018-01-08 DIAGNOSIS — E039 Hypothyroidism, unspecified: Secondary | ICD-10-CM | POA: Diagnosis not present

## 2018-01-08 LAB — T4, FREE: Free T4: 0.68 ng/dL (ref 0.60–1.60)

## 2018-01-08 LAB — TSH: TSH: 4.81 u[IU]/mL — ABNORMAL HIGH (ref 0.35–4.50)

## 2018-01-08 LAB — BASIC METABOLIC PANEL
BUN: 11 mg/dL (ref 6–23)
CO2: 24 mEq/L (ref 19–32)
Calcium: 9.4 mg/dL (ref 8.4–10.5)
Chloride: 103 mEq/L (ref 96–112)
Creatinine, Ser: 0.86 mg/dL (ref 0.40–1.20)
GFR: 84.29 mL/min (ref >60.00)
Glucose, Bld: 116 mg/dL — ABNORMAL HIGH (ref 70–99)
Potassium: 4.2 mEq/L (ref 3.5–5.1)
Sodium: 140 mEq/L (ref 135–145)

## 2018-01-08 LAB — T3, FREE: T3, Free: 2.8 pg/mL (ref 2.3–4.2)

## 2018-01-08 MED ORDER — OMEPRAZOLE 20 MG PO CPDR: 20.0000 mg | DELAYED_RELEASE_CAPSULE | Freq: Every day | ORAL | 1 refills | Status: DC

## 2018-01-08 NOTE — Telephone Encounter (Signed)
Requested Prescriptions  Pending Prescriptions Disp Refills  . omeprazole (PRILOSEC) 20 MG capsule 90 capsule 1    Sig: Take 1 capsule (20 mg total) by mouth daily.     Gastroenterology: Proton Pump Inhibitors Passed - 01/08/2018 12:19 PM      Passed - Valid encounter within last 12 months    Recent Outpatient Visits          1 month ago Lesion of lip   [facility]  Guse, Jacquelynn Cree, FNP   1 month ago Allergic rhinitis   [facility] Guse, Jacquelynn Cree, FNP   1 month ago Right foot pain    [place], FNP   6 months ago Essential hypertension   [place], Yvetta Coder, FNP   7 months ago Essential hypertension   [place], Yvetta Coder, FNP      Future Appointments            In 2 weeks [employer], Yvetta Coder, [place], [place]   In 8 months O'[facility]Bl[place], Bryson Corona, LPN [place], [place]

## 2018-01-08 NOTE — Telephone Encounter (Signed)
Copied from [place] [id number]. Topic: Quick Communication - Rx Refill/Question >> Jan 08, 2018 11:42 AM Margot Ables wrote: Medication: omeprazole (PRILOSEC) 20 MG capsule - pt bought some OTC since pharmacy said they were not getting a response from MD office x 1 week  Has the patient contacted their pharmacy? Yes -   Preferred Pharmacy (with phone number or street name): [id number] - [place], [place] - [address] [phone] (Phone) [phone] (Fax)

## 2018-01-15 ENCOUNTER — Other Ambulatory Visit: Payer: Self-pay | Admitting: Family

## 2018-01-15 DIAGNOSIS — E039 Hypothyroidism, unspecified: Secondary | ICD-10-CM

## 2018-01-22 ENCOUNTER — Telehealth: Payer: Self-pay | Admitting: Family

## 2018-01-22 NOTE — Telephone Encounter (Signed)
Copied from [place] [id number]. Topic: Quick Communication - Lab Results (Clinic Use ONLY) >> Jan 16, 2018 11:26 AM Dorna Leitz, CMA wrote: Called patient to inform them of 01/16/2018 lab results. When patient returns call, triage nurse may disclose results.

## 2018-01-22 NOTE — Telephone Encounter (Signed)
Please call [phone] with the lab results.

## 2018-01-24 ENCOUNTER — Encounter: Payer: Self-pay | Admitting: Family

## 2018-01-24 ENCOUNTER — Ambulatory Visit (INDEPENDENT_AMBULATORY_CARE_PROVIDER_SITE_OTHER): Payer: Medicare Other | Admitting: Family

## 2018-01-24 ENCOUNTER — Other Ambulatory Visit: Payer: Self-pay

## 2018-01-24 ENCOUNTER — Ambulatory Visit: Payer: Medicare Other | Admitting: Family

## 2018-01-24 VITALS — BP 128/76 | HR 76 | Temp 98.1°F | Wt 200.8 lb

## 2018-01-24 DIAGNOSIS — G4733 Obstructive sleep apnea (adult) (pediatric): Secondary | ICD-10-CM

## 2018-01-24 DIAGNOSIS — I10 Essential (primary) hypertension: Secondary | ICD-10-CM

## 2018-01-24 DIAGNOSIS — Z0000 Encounter for general adult medical examination without abnormal findings: Secondary | ICD-10-CM | POA: Diagnosis not present

## 2018-01-24 DIAGNOSIS — E785 Hyperlipidemia, unspecified: Secondary | ICD-10-CM

## 2018-01-24 DIAGNOSIS — E039 Hypothyroidism, unspecified: Secondary | ICD-10-CM

## 2018-01-24 DIAGNOSIS — G4700 Insomnia, unspecified: Secondary | ICD-10-CM

## 2018-01-24 DIAGNOSIS — G629 Polyneuropathy, unspecified: Secondary | ICD-10-CM | POA: Insufficient documentation

## 2018-01-24 MED ORDER — OMEPRAZOLE 20 MG PO CPDR: 20.0000 mg | DELAYED_RELEASE_CAPSULE | Freq: Every day | ORAL | 1 refills | Status: DC

## 2018-01-24 MED ORDER — LISINOPRIL 5 MG PO TABS: 5.0000 mg | ORAL_TABLET | Freq: Every day | ORAL | 3 refills | Status: DC

## 2018-01-24 MED ORDER — ROSUVASTATIN CALCIUM 40 MG PO TABS: 40.0000 mg | ORAL_TABLET | Freq: Every day | ORAL | 2 refills | Status: DC

## 2018-01-24 MED ORDER — ACYCLOVIR 400 MG PO TABS: 400.0000 mg | ORAL_TABLET | Freq: Every day | ORAL | 1 refills | Status: DC

## 2018-01-24 MED ORDER — LISINOPRIL 10 MG PO TABS: 10.0000 mg | ORAL_TABLET | Freq: Every day | ORAL | 1 refills | Status: DC

## 2018-01-24 MED ORDER — TRAZODONE HCL 50 MG PO TABS: 100.0000 mg | ORAL_TABLET | Freq: Every day | ORAL | 1 refills | Status: DC

## 2018-01-24 MED ORDER — LEVOTHYROXINE SODIUM 50 MCG PO TABS: 50.0000 ug | ORAL_TABLET | Freq: Every day | ORAL | 1 refills | Status: DC

## 2018-01-24 NOTE — Assessment & Plan Note (Signed)
Needs titration.  Titration order sent.

## 2018-01-24 NOTE — Assessment & Plan Note (Addendum)
Elevated based on home readings.  We jointly agreed feel more comfortable increasing lisinopril slightly.  Repeat BMP in 1 week.

## 2018-01-24 NOTE — Assessment & Plan Note (Signed)
depressed mood noted on PHQ 9 today.  Advised patient to go ahead increase trazodone 50 mg 100 mg at bedtime.  Close follow-up advised.

## 2018-01-24 NOTE — Assessment & Plan Note (Signed)
Foot pain, tingling resolved on gabapentin.  We will continue

## 2018-01-24 NOTE — Progress Notes (Signed)
Subjective:    Patient ID: Jennifer [name], female    DOB: 1950-12-30, 68 y.o.   MRN: [id number]  CC: Jennifer [name] is a 68 y.o. female who presents today for follow up.   HPI: Feels well today, would like refills on medications.  HTN- compliant with medication. At home BP 150s/ 85.  No cp, sob.   Peripheral neuropathy Feet pain has resolved on gabapentin. OSA- wearing Cpap, setting is 6 , had been on 10. Feels like air is too weak.  Needs a prescription for this.                   Insomnia- trazodone helps fall asleep. Cpap machine makes her uncomfortable which can affect sleep.  Does also note depressed mood on occasion.  No thoughts of hurting herself or anyone else                                   HISTORY:  Past Medical History:  Diagnosis Date  . Constipation   . Elevated liver enzymes   . Hemorrhoids   . High cholesterol   . Hyperlipidemia   . Hypertension   . Hypothyroidism    Past Surgical History:  Procedure Laterality Date  . ABDOMINAL HYSTERECTOMY     total for fibroids no h/o abnormal pap  . bariatric sleeve  2015  . BREAST EXCISIONAL BIOPSY Left 1998  . carpal tunnel repair    . COLONOSCOPY WITH PROPOFOL N/A 02/11/2016   Procedure: COLONOSCOPY WITH PROPOFOL;  Surgeon: Jonathon Bellows, MD;  Location: [facility] ENDOSCOPY;  Service: Endoscopy;  Laterality: N/A;  . HEMORRHOID SURGERY     Family History  Problem Relation Age of Onset  . Breast cancer Sister 18       materal 1/2 sister    Allergies: Celecoxib; Pollen extract; Nasacort [triamcinolone]; and Vicodin [hydrocodone-acetaminophen] Current Outpatient Medications on File Prior to Visit  Medication Sig Dispense Refill  . acetaminophen (TYLENOL) 500 MG tablet Take 1,000 mg by mouth every 6 (six) hours as needed.     [name] Kitchen aspirin 81 MG tablet Take 81 mg by mouth daily.    . Biotin 5000 MCG CAPS Take 5,000 mcg by mouth daily.     . cholecalciferol (VITAMIN D) 1000 UNITS tablet Take 400 Units by mouth daily.       . fexofenadine (ALLEGRA) 180 MG tablet Take 1 tablet (180 mg total) by mouth daily. 30 tablet 3  . fluticasone (FLONASE) 50 MCG/ACT nasal spray Place 1 spray into both nostrils daily. 16 g 2  . gabapentin (NEURONTIN) 100 MG capsule Take 1 capsule (100 mg total) by mouth at bedtime. 90 capsule 3  . hydrocortisone (ANUSOL-HC) 25 MG suppository Place 1 suppository (25 mg total) rectally 2 (two) times daily as needed for hemorrhoids or anal itching. 12 suppository 2  . Multiple Vitamins-Minerals (MULTIVITAMIN WITH MINERALS) tablet Take 1 tablet by mouth daily.    . naphazoline-pheniramine (ALLERGY EYE) 0.025-0.3 % ophthalmic solution Place 1 drop into both eyes 4 (four) times daily as needed for eye irritation or allergies. 15 mL 1  . sodium chloride (OCEAN) 0.65 % SOLN nasal spray Place 1 spray into both nostrils as needed for congestion.     No current facility-administered medications on file prior to visit.     Social History   Tobacco Use  . Smoking status: Former Research scientist (life sciences)  . Smokeless tobacco: Never Used  .  Tobacco comment: quit 1995.   Substance Use Topics  . Alcohol use: Yes    Alcohol/week: 14.0 standard drinks    Types: 14 Glasses of wine per week  . Drug use: No    Review of Systems  Constitutional: Negative for chills and fever.  Respiratory: Negative for cough.   Cardiovascular: Negative for chest pain and palpitations.  Gastrointestinal: Negative for nausea and vomiting.  Musculoskeletal: Negative for arthralgias.  Psychiatric/Behavioral: Positive for sleep disturbance. Negative for suicidal ideas.      Objective:    BP 128/76 (BP Location: Left Arm, Patient Position: Sitting, Cuff Size: Large)   Pulse 76   Temp 98.1 F (36.7 C)   Wt 200 lb 12.8 oz (91.1 kg)   SpO2 98%   BMI 37.33 kg/m  BP Readings from Last 3 Encounters:  01/24/18 128/76  12/06/17 (!) 156/88  12/01/17 (!) 142/78   Wt Readings from Last 3 Encounters:  01/24/18 200 lb 12.8 oz (91.1 kg)   12/06/17 203 lb (92.1 kg)  12/01/17 198 lb 6.4 oz (90 kg)    Physical Exam Vitals signs reviewed.  Constitutional:      Appearance: She is well-developed.  Eyes:     Conjunctiva/sclera: Conjunctivae normal.  Cardiovascular:     Rate and Rhythm: Normal rate and regular rhythm.     Pulses: Normal pulses.     Heart sounds: Normal heart sounds.  Pulmonary:     Effort: Pulmonary effort is normal.     Breath sounds: Normal breath sounds. No wheezing, rhonchi or rales.  Skin:    General: Skin is warm and dry.  Neurological:     Mental Status: She is alert.  Psychiatric:        Speech: Speech normal.        Behavior: Behavior normal.        Thought Content: Thought content normal.        Assessment & Plan:   Problem List Items Addressed This Visit      Cardiovascular and Mediastinum   Essential hypertension - Primary    Elevated based on home readings.  We jointly agreed feel more comfortable increasing lisinopril slightly.  Repeat BMP in 1 week.      Relevant Medications   lisinopril (PRINIVIL,ZESTRIL) 10 MG tablet   Other Relevant Orders   Basic metabolic panel     Respiratory   OSA (obstructive sleep apnea)    Needs titration.  Titration order sent.      Relevant Orders   Ambulatory referral to Sleep Studies     Nervous and Auditory   Peripheral neuropathy    Foot pain, tingling resolved on gabapentin.  We will continue      Relevant Medications   traZODone (DESYREL) 50 MG tablet     Other   Routine physical examination   Relevant Medications   traZODone (DESYREL) 50 MG tablet   Insomnia    depressed mood noted on PHQ 9 today.  Advised patient to go ahead increase trazodone 50 mg 100 mg at bedtime.  Close follow-up advised.      Relevant Medications   traZODone (DESYREL) 50 MG tablet       I have discontinued [name] Levier's diclofenac sodium. I have also changed her lisinopril and traZODone. Additionally, I am having her maintain her Biotin,  aspirin, cholecalciferol, acetaminophen, multivitamin with minerals, sodium chloride, hydrocortisone, gabapentin, fexofenadine, fluticasone, and naphazoline-pheniramine.   Meds ordered this encounter  Medications  . lisinopril (PRINIVIL,ZESTRIL) 10 MG tablet  Sig: Take 1 tablet (10 mg total) by mouth daily.    Dispense:  90 tablet    Refill:  1    Order Specific Question:   Supervising Provider    Answer:   Deborra Medina L [[id number]]  . traZODone (DESYREL) 50 MG tablet    Sig: Take 2 tablets (100 mg total) by mouth at bedtime.    Dispense:  90 tablet    Refill:  1    Order Specific Question:   Supervising Provider    Answer:   Crecencio Mc [[id number]]    Return precautions given.   Risks, benefits, and alternatives of the medications and treatment plan prescribed today were discussed, and patient expressed understanding.   Education regarding symptom management and diagnosis given to patient on AVS.  Continue to follow with [name], FNP for routine health maintenance.   Jennifer [name] and I agreed with plan.   [name], FNP

## 2018-01-24 NOTE — Patient Instructions (Signed)
As discussed, will increase lisinopril from 5 mg once a day to 10 mg once per day I also increase her trazodone from 50 mg at bedtime to 100 mg at bedtime.  Let me know if this helps with mood, sleep. You will need labs, BMP, done in 1 week for the increase lisinopril. Please maintain your appointment for your thyroid studies January 29.  Monitor blood pressure,  Goal is less than 120/80, based on newest guidelines; if persistently higher, please make sooner follow up appointment so we can recheck you blood pressure and manage medications   Managing Your Hypertension Hypertension is commonly called high blood pressure. This is when the force of your blood pressing against the walls of your arteries is too strong. Arteries are blood vessels that carry blood from your heart throughout your body. Hypertension forces the heart to work harder to pump blood, and may cause the arteries to become narrow or stiff. Having untreated or uncontrolled hypertension can cause heart attack, stroke, kidney disease, and other problems. What are blood pressure readings? A blood pressure reading consists of a higher number over a lower number. Ideally, your blood pressure should be below 120/80. The first ("top") number is called the systolic pressure. It is a measure of the pressure in your arteries as your heart beats. The second ("bottom") number is called the diastolic pressure. It is a measure of the pressure in your arteries as the heart relaxes. What does my blood pressure reading mean? Blood pressure is classified into four stages. Based on your blood pressure reading, your health care provider may use the following stages to determine what type of treatment you need, if any. Systolic pressure and diastolic pressure are measured in a unit called mm Hg. Normal  Systolic pressure: below 696.  Diastolic pressure: below 80. Elevated  Systolic pressure: 295-284.  Diastolic pressure: below 80. Hypertension  stage 1  Systolic pressure: 132-440.  Diastolic pressure: 10-27. Hypertension stage 2  Systolic pressure: 253 or above.  Diastolic pressure: 90 or above. What health risks are associated with hypertension? Managing your hypertension is an important responsibility. Uncontrolled hypertension can lead to:  A heart attack.  A stroke.  A weakened blood vessel (aneurysm).  Heart failure.  Kidney damage.  Eye damage.  Metabolic syndrome.  Memory and concentration problems. What changes can I make to manage my hypertension? Hypertension can be managed by making lifestyle changes and possibly by taking medicines. Your health care provider will help you make a plan to bring your blood pressure within a normal range. Eating and drinking   Eat a diet that is high in fiber and potassium, and low in salt (sodium), added sugar, and fat. An example eating plan is called the DASH (Dietary Approaches to Stop Hypertension) diet. To eat this way: ? Eat plenty of fresh fruits and vegetables. Try to fill half of your plate at each meal with fruits and vegetables. ? Eat whole grains, such as whole wheat pasta, brown rice, or whole grain bread. Fill about one quarter of your plate with whole grains. ? Eat low-fat diary products. ? Avoid fatty cuts of meat, processed or cured meats, and poultry with skin. Fill about one quarter of your plate with lean proteins such as fish, chicken without skin, beans, eggs, and tofu. ? Avoid premade and processed foods. These tend to be higher in sodium, added sugar, and fat.  Reduce your daily sodium intake. Most people with hypertension should eat less than 1,500 mg of  sodium a day.  Limit alcohol intake to no more than 1 drink a day for nonpregnant women and 2 drinks a day for men. One drink equals 12 oz of beer, 5 oz of wine, or 1 oz of hard liquor. Lifestyle  Work with your health care provider to maintain a healthy body weight, or to lose weight. Ask what  an ideal weight is for you.  Get at least 30 minutes of exercise that causes your heart to beat faster (aerobic exercise) most days of the week. Activities may include walking, swimming, or biking.  Include exercise to strengthen your muscles (resistance exercise), such as weight lifting, as part of your weekly exercise routine. Try to do these types of exercises for 30 minutes at least 3 days a week.  Do not use any products that contain nicotine or tobacco, such as cigarettes and e-cigarettes. If you need help quitting, ask your health care provider.  Control any long-term (chronic) conditions you have, such as high cholesterol or diabetes. Monitoring  Monitor your blood pressure at home as told by your health care provider. Your personal target blood pressure may vary depending on your medical conditions, your age, and other factors.  Have your blood pressure checked regularly, as often as told by your health care provider. Working with your health care provider  Review all the medicines you take with your health care provider because there may be side effects or interactions.  Talk with your health care provider about your diet, exercise habits, and other lifestyle factors that may be contributing to hypertension.  Visit your health care provider regularly. Your health care provider can help you create and adjust your plan for managing hypertension. Will I need medicine to control my blood pressure? Your health care provider may prescribe medicine if lifestyle changes are not enough to get your blood pressure under control, and if:  Your systolic blood pressure is 130 or higher.  Your diastolic blood pressure is 80 or higher. Take medicines only as told by your health care provider. Follow the directions carefully. Blood pressure medicines must be taken as prescribed. The medicine does not work as well when you skip doses. Skipping doses also puts you at risk for problems. Contact a  health care provider if:  You think you are having a reaction to medicines you have taken.  You have repeated (recurrent) headaches.  You feel dizzy.  You have swelling in your ankles.  You have trouble with your vision. Get help right away if:  You develop a severe headache or confusion.  You have unusual weakness or numbness, or you feel faint.  You have severe pain in your chest or abdomen.  You vomit repeatedly.  You have trouble breathing. Summary  Hypertension is when the force of blood pumping through your arteries is too strong. If this condition is not controlled, it may put you at risk for serious complications.  Your personal target blood pressure may vary depending on your medical conditions, your age, and other factors. For most people, a normal blood pressure is less than 120/80.  Hypertension is managed by lifestyle changes, medicines, or both. Lifestyle changes include weight loss, eating a healthy, low-sodium diet, exercising more, and limiting alcohol. This information is not intended to replace advice given to you by your health care provider. Make sure you discuss any questions you have with your health care provider. Document Released: 09/28/2011 Document Revised: 12/02/2015 Document Reviewed: 12/02/2015 Elsevier Interactive Patient Education  2019 Elsevier  Inc.

## 2018-01-30 ENCOUNTER — Other Ambulatory Visit: Payer: Self-pay | Admitting: Family

## 2018-01-30 DIAGNOSIS — Z1231 Encounter for screening mammogram for malignant neoplasm of breast: Secondary | ICD-10-CM

## 2018-01-31 NOTE — Progress Notes (Signed)
Patient being referred to pulmonology for this.

## 2018-02-01 ENCOUNTER — Other Ambulatory Visit (INDEPENDENT_AMBULATORY_CARE_PROVIDER_SITE_OTHER): Payer: Medicare Other

## 2018-02-01 DIAGNOSIS — I10 Essential (primary) hypertension: Secondary | ICD-10-CM | POA: Diagnosis not present

## 2018-02-01 DIAGNOSIS — E039 Hypothyroidism, unspecified: Secondary | ICD-10-CM | POA: Diagnosis not present

## 2018-02-01 LAB — T3, FREE: T3, Free: 2.8 pg/mL (ref 2.3–4.2)

## 2018-02-01 LAB — BASIC METABOLIC PANEL
BUN: 14 mg/dL (ref 6–23)
CO2: 26 mEq/L (ref 19–32)
Calcium: 10.2 mg/dL (ref 8.4–10.5)
Chloride: 103 mEq/L (ref 96–112)
Creatinine, Ser: 1.08 mg/dL (ref 0.40–1.20)
GFR: 64.79 mL/min (ref >60.00)
Glucose, Bld: 110 mg/dL — ABNORMAL HIGH (ref 70–99)
Potassium: 4.2 mEq/L (ref 3.5–5.1)
Sodium: 138 mEq/L (ref 135–145)

## 2018-02-01 LAB — TSH: TSH: 2.47 u[IU]/mL (ref 0.35–4.50)

## 2018-02-01 LAB — T4, FREE: Free T4: 0.86 ng/dL (ref 0.60–1.60)

## 2018-02-07 ENCOUNTER — Other Ambulatory Visit: Payer: Self-pay | Admitting: Family

## 2018-02-07 DIAGNOSIS — Z0000 Encounter for general adult medical examination without abnormal findings: Secondary | ICD-10-CM

## 2018-02-07 DIAGNOSIS — G4700 Insomnia, unspecified: Secondary | ICD-10-CM

## 2018-02-07 MED ORDER — TRAZODONE HCL 100 MG PO TABS: 100.0000 mg | ORAL_TABLET | Freq: Every day | ORAL | 3 refills | Status: DC

## 2018-02-07 NOTE — Telephone Encounter (Signed)
Call patient At last visit we increased her trazodone from 50 to 100 mg at bedtime.  I went to refill the proper dose.  Is she feeling better on 100 mg at bedtime?  Please refill at correct dose.

## 2018-02-13 DIAGNOSIS — B351 Tinea unguium: Secondary | ICD-10-CM | POA: Diagnosis not present

## 2018-02-13 DIAGNOSIS — M79671 Pain in right foot: Secondary | ICD-10-CM | POA: Diagnosis not present

## 2018-02-13 DIAGNOSIS — M79672 Pain in left foot: Secondary | ICD-10-CM | POA: Diagnosis not present

## 2018-02-13 DIAGNOSIS — G621 Alcoholic polyneuropathy: Secondary | ICD-10-CM | POA: Diagnosis not present

## 2018-02-13 NOTE — Telephone Encounter (Signed)
Patient called and given the results below from [place] noted by [name], FNP on 02/02/18, patient verbalized understanding and says she is not able to get into her MyChart right now. She says she has labs tomorrow and an appointment in August, I advised the appointment was cancelled and she asked to reschedule. Appointment rescheduled for 09/05/18 at 0930 with Joycelyn Schmid.  Patient Result Comments   Written by [name], FNP on 02/02/2018 9:44 AM  Hi Jennifer [name],   Normal electrolytes , thyroid and kidney function. Stay on current Synthroid dose.   Take care,  Joycelyn Schmid, NP

## 2018-02-13 NOTE — Telephone Encounter (Signed)
Patient called, left VM to return call to the office for lab results.

## 2018-02-13 NOTE — Telephone Encounter (Signed)
Patient is returning call regarding her labs.  Please call patient back at [phone]

## 2018-02-14 ENCOUNTER — Telehealth: Payer: Self-pay | Admitting: Radiology

## 2018-02-14 ENCOUNTER — Other Ambulatory Visit (INDEPENDENT_AMBULATORY_CARE_PROVIDER_SITE_OTHER): Payer: Medicare Other

## 2018-02-14 DIAGNOSIS — E039 Hypothyroidism, unspecified: Secondary | ICD-10-CM | POA: Diagnosis not present

## 2018-02-14 LAB — TSH: TSH: 3.23 u[IU]/mL (ref 0.35–4.50)

## 2018-02-14 LAB — T3, FREE: T3, Free: 3 pg/mL (ref 2.3–4.2)

## 2018-02-14 LAB — T4, FREE: Free T4: 0.77 ng/dL (ref 0.60–1.60)

## 2018-02-14 NOTE — Telephone Encounter (Signed)
Pt came in for TSH labs today, please place future orders. Thank you.

## 2018-02-14 NOTE — Addendum Note (Signed)
Addended by: [name] on: 02/14/2018 10:15 AM   Modules accepted: Orders

## 2018-02-14 NOTE — Telephone Encounter (Signed)
ordered

## 2018-02-23 ENCOUNTER — Ambulatory Visit
Admission: RE | Admit: 2018-02-23 | Discharge: 2018-02-23 | Disposition: A | Discharge status: Home / Self Care | Payer: Medicare Other | Source: Ambulatory Visit | Attending: Family | Admitting: Family

## 2018-02-23 DIAGNOSIS — Z1231 Encounter for screening mammogram for malignant neoplasm of breast: Secondary | ICD-10-CM | POA: Insufficient documentation

## 2018-03-01 ENCOUNTER — Encounter: Payer: Self-pay | Admitting: Internal Medicine

## 2018-03-01 ENCOUNTER — Ambulatory Visit (INDEPENDENT_AMBULATORY_CARE_PROVIDER_SITE_OTHER): Payer: Medicare Other | Admitting: Internal Medicine

## 2018-03-01 VITALS — BP 124/72 | HR 71 | Ht 61.5 in | Wt 200.0 lb

## 2018-03-01 DIAGNOSIS — G4733 Obstructive sleep apnea (adult) (pediatric): Secondary | ICD-10-CM

## 2018-03-01 NOTE — Patient Instructions (Signed)
INCREASE CPAP to 8CM H20

## 2018-03-01 NOTE — Progress Notes (Signed)
Name: Jennifer [name] MRN: [id number] DOB: Feb 21, 1950     CONSULTATION DATE: 03/01/2018 REFERRING MD : lada  CHIEF COMPLAINT: I have Sleep apnea    HISTORY OF PRESENT ILLNESS:  Patient is seen today for problems and issues with sleep  Patient  has been having sleep problems for many years  Patient has been having excessive daytime sleepiness for a long time  Patient has been having extreme fatigue and tiredness, lack of energy  +  very Loud snoring every night  + struggling breathe at night and gasps for air- she underwent sleep study   She was dx with OSA in 2015 Repeat CPAP titration in 2018 She has been on nasal pillows with 6 cm h20 and she states that this was too much pressure She changed to nasal mask and 6cm h20 is very little pressure She would like to increase pressure  She has no acute resp issues No chest pain No SOB  No signs of infection  Discussed sleep data and reviewed with patient.  Encouraged proper weight management.  Discussed driving precautions and its relationship with hypersomnolence.  Discussed operating dangerous equipment and its relationship with hypersomnolence.  Discussed sleep hygiene, and benefits of a fixed sleep waked time.  The importance of getting eight or more hours of sleep discussed with patient.  Discussed limiting the use of the computer and television before bedtime.  Decrease naps during the day, so night time sleep will become enhanced.  Limit caffeine, and sleep deprivation.  HTN, stroke, and heart failure are potential risk factors.    PAST MEDICAL HISTORY :   has a past medical history of Constipation, Elevated liver enzymes, Hemorrhoids, High cholesterol, Hyperlipidemia, Hypertension, Hypothyroidism, and Sleep apnea.  has a past surgical history that includes carpal tunnel repair; bariatric sleeve (2015); Hemorrhoid surgery; Breast excisional biopsy (Left, 1998); Colonoscopy with propofol (N/A, 02/11/2016); and  Abdominal hysterectomy. Prior to Admission medications   Medication Sig Start Date End Date Taking? Authorizing Provider  acetaminophen (TYLENOL) 500 MG tablet Take 1,000 mg by mouth every 6 (six) hours as needed.    Yes [provider]  acyclovir (ZOVIRAX) 400 MG tablet Take 1 tablet (400 mg total) by mouth daily. 01/24/18  Yes [name], FNP  aspirin 81 MG tablet Take 81 mg by mouth daily.   Yes [provider]  Biotin 5000 MCG CAPS Take 5,000 mcg by mouth daily.    Yes [provider]  cholecalciferol (VITAMIN D) 1000 UNITS tablet Take 400 Units by mouth daily.    Yes [provider]  fexofenadine (ALLEGRA) 180 MG tablet Take 1 tablet (180 mg total) by mouth daily. 12/01/17  Yes Guse, Jacquelynn Cree, FNP  fluticasone (FLONASE) 50 MCG/ACT nasal spray Place 1 spray into both nostrils daily. 12/01/17  Yes Guse, Jacquelynn Cree, FNP  gabapentin (NEURONTIN) 100 MG capsule Take 1 capsule (100 mg total) by mouth at bedtime. 11/27/17  Yes Arnett, Yvetta Coder, FNP  hydrocortisone (ANUSOL-HC) 25 MG suppository Place 1 suppository (25 mg total) rectally 2 (two) times daily as needed for hemorrhoids or anal itching. 03/27/17  Yes Arnett, Yvetta Coder, FNP  levothyroxine (SYNTHROID, LEVOTHROID) 50 MCG tablet Take 1 tablet (50 mcg total) by mouth daily. 01/24/18  Yes Arnett, Yvetta Coder, FNP  lisinopril (PRINIVIL,ZESTRIL) 10 MG tablet Take 1 tablet (10 mg total) by mouth daily. 01/24/18  Yes Arnett, Yvetta Coder, FNP  Multiple Vitamins-Minerals (MULTIVITAMIN WITH MINERALS) tablet Take 1 tablet by mouth daily.   Yes  [provider]  naphazoline-pheniramine (ALLERGY EYE) 0.025-0.3 % ophthalmic solution Place 1 drop into both eyes 4 (four) times daily as needed for eye irritation or allergies. 12/01/17  Yes Guse, Jacquelynn Cree, FNP  omeprazole (PRILOSEC) 20 MG capsule Take 1 capsule (20 mg total) by mouth daily. 01/24/18  Yes [name], FNP  rosuvastatin (CRESTOR) 40 MG tablet Take 1  tablet (40 mg total) by mouth daily. 01/24/18  Yes Arnett, Yvetta Coder, FNP  sodium chloride (OCEAN) 0.65 % SOLN nasal spray Place 1 spray into both nostrils as needed for congestion.   Yes [provider]  traZODone (DESYREL) 100 MG tablet Take 1 tablet (100 mg total) by mouth at bedtime. 02/07/18  Yes [name], FNP   Allergies  Allergen Reactions  . Celecoxib Hives  . Pollen Extract   . Nasacort [Triamcinolone] Other (See Comments)    Nasal - Nose Bleeds  . Vicodin [Hydrocodone-Acetaminophen] Nausea Only    FAMILY HISTORY:  family history includes Breast cancer (age of onset: 91) in her sister. SOCIAL HISTORY:  reports that she quit smoking about 27 years ago. Her smoking use included cigarettes. She has a 36.00 pack-year smoking history. She has never used smokeless tobacco. She reports current alcohol use of about 14.0 standard drinks of alcohol per week. She reports that she does not use drugs.  REVIEW OF SYSTEMS:   Constitutional: Negative for fever, chills, weight loss, malaise/fatigue and diaphoresis.  HENT: Negative for hearing loss, ear pain, nosebleeds, congestion, sore throat, neck pain, tinnitus and ear discharge.   Eyes: Negative for blurred vision, double vision, photophobia, pain, discharge and redness.  Respiratory: Negative for cough, hemoptysis, sputum production, shortness of breath, wheezing and stridor.   Cardiovascular: Negative for chest pain, palpitations, orthopnea, claudication, leg swelling and PND.  Gastrointestinal: Negative for heartburn, nausea, vomiting, abdominal pain, diarrhea, constipation, blood in stool and melena.  Genitourinary: Negative for dysuria, urgency, frequency, hematuria and flank pain.  Musculoskeletal: Negative for myalgias, back pain, joint pain and falls.  Skin: Negative for itching and rash.  Neurological: Negative for dizziness, tingling, tremors, sensory change, speech change, focal weakness, seizures, loss of  consciousness, weakness and headaches.  Endo/Heme/Allergies: Negative for environmental allergies and polydipsia. Does not bruise/bleed easily.  ALL OTHER ROS ARE NEGATIVE   BP 124/72 (BP Location: Left Arm, Cuff Size: Normal)   Pulse 71   Ht 5' 1.5" (1.562 m)   Wt 200 lb (90.7 kg)   SpO2 94%   BMI 37.18 kg/m    Physical Examination:   GENERAL:NAD, no fevers, chills, no weakness no fatigue HEAD: Normocephalic, atraumatic.  EYES: Pupils equal, round, reactive to light. Extraocular muscles intact. No scleral icterus.  MOUTH: Moist mucosal membrane.   EAR, NOSE, THROAT: Clear without exudates. No external lesions.  NECK: Supple. No thyromegaly. No nodules. No JVD.  PULMONARY:CTA B/L no wheezes, no crackles, no rhonchi CARDIOVASCULAR: S1 and S2. Regular rate and rhythm. No murmurs, rubs, or gallops. No edema.  GASTROINTESTINAL: Soft, nontender, nondistended. No masses. Positive bowel sounds.  MUSCULOSKELETAL: No swelling, clubbing, or edema. Range of motion full in all extremities.  NEUROLOGIC: Cranial nerves II through XII are intact. No gross focal neurological deficits.  SKIN: No ulceration, lesions, rashes, or cyanosis. Skin warm and dry. Turgor intact.  PSYCHIATRIC: Mood, affect within normal limits. The patient is awake, alert and oriented x 3. Insight, judgment intact.      ASSESSMENT / PLAN:  68 yo AAF with dx of OSA She  states that 6 cm H20 on Nasal Mask is not sufficient  AT this time, I will increase pressure to 8 cm h20 and assess status while on nasal mask  Obesity -recommend significant weight loss -recommend changing diet  Deconditioned state -Recommend increased daily activity and exercise     Patient satisfied with Plan of action and management. All questions answered Follow up in 6 months   Sreya Froio Patricia Pesa, M.D.  [facility]  Medical Director [place] Director [facility] Cardio-Pulmonary Department

## 2018-03-05 ENCOUNTER — Encounter: Payer: Self-pay | Admitting: Obstetrics and Gynecology

## 2018-03-05 ENCOUNTER — Other Ambulatory Visit: Payer: Self-pay | Admitting: Nurse Practitioner

## 2018-03-05 ENCOUNTER — Ambulatory Visit: Payer: Self-pay | Admitting: Obstetrics and Gynecology

## 2018-03-05 DIAGNOSIS — K5900 Constipation, unspecified: Secondary | ICD-10-CM | POA: Diagnosis not present

## 2018-03-05 DIAGNOSIS — R1032 Left lower quadrant pain: Secondary | ICD-10-CM

## 2018-03-05 DIAGNOSIS — Z86010 Personal history of colonic polyps: Secondary | ICD-10-CM | POA: Diagnosis not present

## 2018-03-05 DIAGNOSIS — R768 Other specified abnormal immunological findings in serum: Secondary | ICD-10-CM | POA: Diagnosis not present

## 2018-03-05 DIAGNOSIS — K625 Hemorrhage of anus and rectum: Secondary | ICD-10-CM | POA: Diagnosis not present

## 2018-03-05 DIAGNOSIS — K760 Fatty (change of) liver, not elsewhere classified: Secondary | ICD-10-CM | POA: Diagnosis not present

## 2018-03-06 DIAGNOSIS — M79672 Pain in left foot: Secondary | ICD-10-CM | POA: Diagnosis not present

## 2018-03-06 DIAGNOSIS — M79671 Pain in right foot: Secondary | ICD-10-CM | POA: Diagnosis not present

## 2018-03-06 DIAGNOSIS — B351 Tinea unguium: Secondary | ICD-10-CM | POA: Diagnosis not present

## 2018-03-07 ENCOUNTER — Ambulatory Visit
Admission: RE | Admit: 2018-03-07 | Discharge: 2018-03-07 | Disposition: A | Discharge status: Home / Self Care | Payer: Medicare Other | Source: Ambulatory Visit | Attending: Nurse Practitioner | Admitting: Nurse Practitioner

## 2018-03-07 ENCOUNTER — Encounter: Payer: Self-pay | Admitting: *Deleted

## 2018-03-07 ENCOUNTER — Other Ambulatory Visit: Payer: Self-pay

## 2018-03-07 ENCOUNTER — Emergency Department
Admission: EM | Admit: 2018-03-07 | Discharge: 2018-03-07 | Disposition: A | Discharge status: Home / Self Care | Payer: Medicare Other | Attending: Emergency Medicine | Admitting: Emergency Medicine

## 2018-03-07 DIAGNOSIS — I700 Atherosclerosis of aorta: Secondary | ICD-10-CM | POA: Diagnosis not present

## 2018-03-07 DIAGNOSIS — R160 Hepatomegaly, not elsewhere classified: Secondary | ICD-10-CM | POA: Insufficient documentation

## 2018-03-07 DIAGNOSIS — E039 Hypothyroidism, unspecified: Secondary | ICD-10-CM | POA: Insufficient documentation

## 2018-03-07 DIAGNOSIS — Z7982 Long term (current) use of aspirin: Secondary | ICD-10-CM | POA: Insufficient documentation

## 2018-03-07 DIAGNOSIS — I10 Essential (primary) hypertension: Secondary | ICD-10-CM

## 2018-03-07 DIAGNOSIS — Z87891 Personal history of nicotine dependence: Secondary | ICD-10-CM | POA: Diagnosis not present

## 2018-03-07 DIAGNOSIS — Z79899 Other long term (current) drug therapy: Secondary | ICD-10-CM | POA: Insufficient documentation

## 2018-03-07 DIAGNOSIS — R1032 Left lower quadrant pain: Secondary | ICD-10-CM

## 2018-03-07 LAB — BASIC METABOLIC PANEL
Anion gap: 8 (ref 5–15)
BUN: 15 mg/dL (ref 8–23)
CO2: 24 mmol/L (ref 22–32)
Calcium: 9.6 mg/dL (ref 8.9–10.3)
Chloride: 104 mmol/L (ref 98–111)
Creatinine, Ser: 1 mg/dL (ref 0.44–1.00)
GFR calc Af Amer: >60 mL/min (ref >60)
GFR calc non Af Amer: 58 mL/min — ABNORMAL LOW (ref >60)
Glucose, Bld: 109 mg/dL — ABNORMAL HIGH (ref 70–99)
Potassium: 4.4 mmol/L (ref 3.5–5.1)
Sodium: 136 mmol/L (ref 135–145)

## 2018-03-07 LAB — CBC
HCT: 39.5 % (ref 36.0–46.0)
Hemoglobin: 12.8 g/dL (ref 12.0–15.0)
MCH: 29 pg (ref 26.0–34.0)
MCHC: 32.4 g/dL (ref 30.0–36.0)
MCV: 89.6 fL (ref 80.0–100.0)
Platelets: 166 10*3/uL (ref 150–400)
RBC: 4.41 MIL/uL (ref 3.87–5.11)
RDW: 16.6 % — ABNORMAL HIGH (ref 11.5–15.5)
WBC: 7.7 10*3/uL (ref 4.0–10.5)
nRBC: 0 % (ref 0.0–0.2)

## 2018-03-07 LAB — TROPONIN I
Troponin I: <0.03 ng/mL (ref <0.03)
Troponin I: <0.03 ng/mL (ref <0.03)

## 2018-03-07 MED ORDER — IOHEXOL 300 MG/ML  SOLN
100.0000 mL | Freq: Once | INTRAMUSCULAR | Status: AC | PRN
  Administered 2018-03-07: 100 mL via INTRAVENOUS

## 2018-03-07 MED ORDER — SODIUM CHLORIDE 0.9% FLUSH: 3.0000 mL | Freq: Once | INTRAVENOUS | Status: DC

## 2018-03-07 NOTE — ED Notes (Signed)
Pt to the er from the walk in clinic for elevated BP. Pt was told by MD that they want her BP with meds around 120/80. Pt is taking lisinopril x a few months. Was taking losarten until recall. Pt had procedure yesterday to remove the toenails off the great toes bilaterally r/t fungal infection. Pt then had a contrasted CT this morning that was ordered by Dr Tiffany Kocher r/t blood in stool. Pt reported that this morning when her BP was up she did have some dizziness. Pt is in no acute distress at this time. Pt BP is only mildly elevated.

## 2018-03-07 NOTE — ED Provider Notes (Signed)
[facility] Emergency Department Provider Note  ____________________________________________  Time seen: Approximately 10:01 PM  I have reviewed the triage vital signs and the nursing notes.   HISTORY  Chief Complaint Hypertension    HPI Jennifer [name] is a 68 y.o. female who presents emergency department for evaluation of hypertension.  Patient reports that yesterday she had toenail excisions of both feet for a "fungal" infection and then a CT earlier today.  Patient reports that she has been in some pain as well as being anxious and checks her blood pressure which had a max reading of 187/109 patient reports that earlier today she had a spell of dizziness and lightheadedness but did not have a syncopal episode.  She denies any chest pain, shortness of breath, abdominal pain, nausea or vomiting at this time.  Patient is completely asymptomatic at this time.  She did take a lisinopril this afternoon which she states  has improved her symptoms.  Patient has a history of anxiety, constipation, hypercholesterolemia, hyperlipidemia, hypertension, hypothyroidism, osteoarthritis, sleep apnea.   Past Medical History:  Diagnosis Date  . Anxiety   . Constipation   . Elevated liver enzymes   . Hemorrhoids   . Herpes genitalis   . High cholesterol   . Hyperlipidemia   . Hypertension   . Hypothyroidism   . Osteoarthritis   . Sleep apnea     Patient Active Problem List   Diagnosis Date Noted  . Peripheral neuropathy 01/24/2018  . Insomnia 07/12/2017  . Constipation 12/15/2016  . Hemorrhoids 12/15/2016  . Hypothyroidism 10/03/2016  . Bradycardia 06/16/2016  . Elevated liver enzymes 04/06/2016  . OSA (obstructive sleep apnea) 04/06/2016  . Fatty liver 12/08/2015  . Arthritis 12/08/2015  . History of bariatric surgery 12/07/2015  . Genital herpes 11/11/2015  . Hyperlipidemia 11/11/2015  . Essential hypertension 11/11/2015  . Routine physical examination  11/11/2015  . Allergic rhinitis 11/11/2015  . GERD (gastroesophageal reflux disease) 11/11/2015  . Impaired fasting glucose 07/17/2012  . Obesity, unspecified 07/17/2012    Past Surgical History:  Procedure Laterality Date  . ABDOMINAL HYSTERECTOMY     total for fibroids no h/o abnormal pap  . bariatric sleeve  2015  . BREAST EXCISIONAL BIOPSY Left 1998  . carpal tunnel repair    . COLONOSCOPY WITH PROPOFOL N/A 02/11/2016   Procedure: COLONOSCOPY WITH PROPOFOL;  Surgeon: Jonathon Bellows, MD;  Location: [facility] ENDOSCOPY;  Service: Endoscopy;  Laterality: N/A;  . HEMORRHOID SURGERY      Prior to Admission medications   Medication Sig Start Date End Date Taking? Authorizing Provider  acetaminophen (TYLENOL) 500 MG tablet Take 1,000 mg by mouth every 6 (six) hours as needed.     [provider]  acyclovir (ZOVIRAX) 400 MG tablet Take 1 tablet (400 mg total) by mouth daily. 01/24/18   [name], FNP  aspirin 81 MG tablet Take 81 mg by mouth daily.    [provider]  Biotin 5000 MCG CAPS Take 5,000 mcg by mouth daily.     [provider]  cholecalciferol (VITAMIN D) 1000 UNITS tablet Take 400 Units by mouth daily.     [provider]  fexofenadine (ALLEGRA) 180 MG tablet Take 1 tablet (180 mg total) by mouth daily. 12/01/17   Guse, Jacquelynn Cree, FNP  fluticasone (FLONASE) 50 MCG/ACT nasal spray Place 1 spray into both nostrils daily. 12/01/17   Jodelle Green, FNP  gabapentin (NEURONTIN) 100 MG capsule Take 1 capsule (100 mg total) by  mouth at bedtime. 11/27/17   [name], FNP  hydrocortisone (ANUSOL-HC) 25 MG suppository Place 1 suppository (25 mg total) rectally 2 (two) times daily as needed for hemorrhoids or anal itching. 03/27/17   [name], FNP  levothyroxine (SYNTHROID, LEVOTHROID) 50 MCG tablet Take 1 tablet (50 mcg total) by mouth daily. 01/24/18   [name], FNP  lisinopril (PRINIVIL,ZESTRIL) 10 MG tablet Take 1 tablet (10  mg total) by mouth daily. 01/24/18   [name], FNP  Multiple Vitamins-Minerals (MULTIVITAMIN WITH MINERALS) tablet Take 1 tablet by mouth daily.    [provider]  naphazoline-pheniramine (ALLERGY EYE) 0.025-0.3 % ophthalmic solution Place 1 drop into both eyes 4 (four) times daily as needed for eye irritation or allergies. 12/01/17   Jodelle Green, FNP  omeprazole (PRILOSEC) 20 MG capsule Take 1 capsule (20 mg total) by mouth daily. 01/24/18   [name], FNP  rosuvastatin (CRESTOR) 40 MG tablet Take 1 tablet (40 mg total) by mouth daily. 01/24/18   [name], FNP  sodium chloride (OCEAN) 0.65 % SOLN nasal spray Place 1 spray into both nostrils as needed for congestion.    [provider]  traZODone (DESYREL) 100 MG tablet Take 1 tablet (100 mg total) by mouth at bedtime. 02/07/18   [name], FNP    Allergies Celecoxib; Pollen extract; Nasacort [triamcinolone]; and Vicodin [hydrocodone-acetaminophen]  Family History  Problem Relation Age of Onset  . Breast cancer Sister 54       materal 1/2 sister  . Hypertension Sister   . Hypertension Mother   . Heart disease Father   . Hypertension Brother     Social History Social History   Tobacco Use  . Smoking status: Former Smoker    Packs/day: 1.50    Years: 24.00    Pack years: 36.00    Types: Cigarettes    Last attempt to quit: 1993    Years since quitting: 27.1  . Smokeless tobacco: Never Used  . Tobacco comment: quit 1995.   Substance Use Topics  . Alcohol use: Yes    Alcohol/week: 14.0 standard drinks    Types: 14 Glasses of wine per week    Comment: occ  . Drug use: No     Review of Systems  Constitutional: No fever/chills.  Positive for hypertension today. Eyes: No visual changes.  Cardiovascular: no chest pain. Respiratory: no cough. No SOB. Gastrointestinal: No abdominal pain.  No nausea, no vomiting.  No diarrhea.  No constipation. Genitourinary: Negative for  dysuria. No hematuria Musculoskeletal: Negative for musculoskeletal pain. Skin: Negative for rash, abrasions, lacerations, ecchymosis. Neurological: Negative for headaches, focal weakness or numbness. 10-point ROS otherwise negative.  ____________________________________________   PHYSICAL EXAM:  VITAL SIGNS: ED Triage Vitals  Enc Vitals Group     BP 03/07/18 1648 (!) 147/81     Pulse Rate 03/07/18 1648 66     Resp 03/07/18 1648 20     Temp 03/07/18 1648 98.2 F (36.8 C)     Temp Source 03/07/18 1648 Oral     SpO2 03/07/18 1648 97 %     Weight 03/07/18 1646 200 lb (90.7 kg)     Height 03/07/18 1646 5\' 1"  (1.549 m)     Head Circumference --      Peak Flow --      Pain Score 03/07/18 1645 3     Pain Loc --      Pain Edu? --  Excl. in [place]? --      Constitutional: Alert and oriented. Well appearing and in no acute distress. Eyes: Conjunctivae are normal. PERRL. EOMI. Head: Atraumatic. Neck: No stridor.    Cardiovascular: Normal rate, regular rhythm. Normal S1 and S2.  Good peripheral circulation. Respiratory: Normal respiratory effort without tachypnea or retractions. Lungs CTAB. Good air entry to the bases with no decreased or absent breath sounds. Musculoskeletal: Full range of motion to all extremities. No gross deformities appreciated. Neurologic:  Normal speech and language. No gross focal neurologic deficits are appreciated.  Skin:  Skin is warm, dry and intact. No rash noted. Psychiatric: Mood and affect are normal. Speech and behavior are normal. Patient exhibits appropriate insight and judgement.   ____________________________________________   LABS (all labs ordered are listed, but only abnormal results are displayed)  Labs Reviewed  BASIC METABOLIC PANEL - Abnormal; Notable for the following components:      Result Value   Glucose, Bld 109 (*)    GFR calc non Af Amer 58 (*)    All other components within normal limits  CBC - Abnormal; Notable for the  following components:   RDW 16.6 (*)    All other components within normal limits  TROPONIN I  TROPONIN I   ____________________________________________  EKG  ED ECG REPORT I, Charline Bills Filiberto Wamble,  personally viewed and interpreted this ECG.   Date: 03/07/2018  EKG Time: 1657 hrs.  Rate: 58 bpm  Rhythm: unchanged from previous tracings, sinus bradycardia  Axis: Normal axis  Intervals:none  ST&T Change: No ST elevation or depression noted.  Sinus bradycardia.  No evidence of STEMI.  Compared to previous EKG, no changes.   ____________________________________________  RADIOLOGY  ____________________________________________    PROCEDURES  Procedure(s) performed:    Procedures    Medications  sodium chloride flush (NS) 0.9 % injection 3 mL (has no administration in time range)     ____________________________________________   INITIAL IMPRESSION / ASSESSMENT AND PLAN / ED COURSE  Pertinent labs & imaging results that were available during my care of the patient were reviewed by me and considered in my medical decision making (see chart for details).  Review of the [place] CSRS was performed in accordance of the [place] prior to dispensing any controlled drugs.      Patient's diagnosis is consistent with hypertension.  Patient presents emergency department complaining of elevated blood pressure readings today.  Patient does have a history of hypertension and has been under stress as well as experiencing some pain due to toenail removal yesterday.  Patient took an extra dose of her lisinopril at home with an improvement of symptoms.  Patient is currently asymptomatic.  Labs were reassuring.  EKG is consistent with previous EKG with no significant findings.  At this time, no changes to patient's daily medication regimen for her hypertension.  No additional prescriptions at this time.  Follow-up primary care as needed..  Patient is given ED precautions to return to the ED  for any worsening or new symptoms.     ____________________________________________  FINAL CLINICAL IMPRESSION(S) / ED DIAGNOSES  Final diagnoses:  Essential hypertension      NEW MEDICATIONS STARTED DURING THIS VISIT:  ED Discharge Orders    None          This chart was dictated using voice recognition software/Dragon. Despite best efforts to proofread, errors can occur which can change the meaning. Any change was purely unintentional.    Darletta Moll, PA-C 03/07/18 2207  Eula Listen, MD 03/07/18 2226

## 2018-03-07 NOTE — ED Triage Notes (Addendum)
Pt to triage via wheelchair.  Pt reports she had  abd ct scan this morning and since the scan her blood pressure has elevated.pt took an extra blood pressure pill today.  Pt alert   Speech clear.   No chest pain or sob.

## 2018-03-13 ENCOUNTER — Telehealth: Payer: Self-pay

## 2018-03-13 DIAGNOSIS — R7301 Impaired fasting glucose: Secondary | ICD-10-CM

## 2018-03-13 NOTE — Telephone Encounter (Signed)
Copied from [place] [id number]. Topic: General - Other >> Mar 13, 2018  1:57 PM Oneta Rack wrote: Relation to pt: self  Call back [place]   Reason for call:  Patient requesting referral to [facility] at [facility] please advise

## 2018-03-14 DIAGNOSIS — R748 Abnormal levels of other serum enzymes: Secondary | ICD-10-CM | POA: Diagnosis not present

## 2018-03-14 DIAGNOSIS — K760 Fatty (change of) liver, not elsewhere classified: Secondary | ICD-10-CM | POA: Diagnosis not present

## 2018-03-14 NOTE — Telephone Encounter (Signed)
Call patient Please let her know that referral is been placed

## 2018-03-14 NOTE — Telephone Encounter (Signed)
LM that referral was placed.

## 2018-03-14 NOTE — Addendum Note (Signed)
Addended by: [name] on: 03/14/2018 12:36 PM   Modules accepted: Orders

## 2018-03-19 ENCOUNTER — Ambulatory Visit: Payer: Self-pay | Admitting: Family

## 2018-03-19 ENCOUNTER — Telehealth: Payer: Self-pay | Admitting: Internal Medicine

## 2018-03-19 DIAGNOSIS — G4733 Obstructive sleep apnea (adult) (pediatric): Secondary | ICD-10-CM

## 2018-03-19 NOTE — Telephone Encounter (Signed)
She called in c/o her BP elevated and having a headache in the top of her head.  She has been taking her BP several times a day.   "It's all over the place".    I let her know she did not need to check her BP so often that it was normal for her BP to change all the time.  See triage notes.  I made an appt with [name], FNP for 03/21/2018 at 10:30.    Reason for Disposition . Systolic BP  >= 062 OR Diastolic >= 694  Answer Assessment - Initial Assessment Questions 1. BLOOD PRESSURE: "What is the blood pressure?" "Did you take at least two measurements 5 minutes apart?"     BP been running high.  143/83  At 7:30am this morning.     On Feb 29th it was 180/89.   My BP has been all over the place.    149/81 Sunday in the morning.  2. ONSET: "When did you take your blood pressure?"     This morning 3. HOW: "How did you obtain the blood pressure?" (e.g., visiting nurse, automatic home BP monitor)     Automatic BP machines have 2 that I'm using. 4. HISTORY: "Do you have a history of high blood pressure?"     Yes.   I feel like the top of my head is hurting. 5. MEDICATIONS: "Are you taking any medications for blood pressure?" "Have you missed any doses recently?"     Yes.  No missed doses. 6. OTHER SYMPTOMS: "Do you have any symptoms?" (e.g., headache, chest pain, blurred vision, difficulty breathing, weakness)     Headache in the top of her head.  A couple of wks ago I had a CT of abd done in [place], [place].    My BP started going up after the CT scan.   I went to the walk in clinic and they sent me to the ED because my BP was elevated.   7. PREGNANCY: "Is there any chance you are pregnant?" "When was your last menstrual period?"     Not asked due to age.  Protocols used: HIGH BLOOD PRESSURE-A-AH

## 2018-03-19 NOTE — Telephone Encounter (Signed)
Please see earlier phone note dated as 03/19/18. Pt is requesting to increase cpap pressure to 9, as she feels pressure is not strong enough.   Dr. Mortimer Fries please advise. Thanks

## 2018-03-19 NOTE — Telephone Encounter (Signed)
I spoke to the patient and let her know Dr. Mortimer Fries said we could increase her pressure to 9. Will send in referral to [facility]. Nothing further needed at this time.

## 2018-03-19 NOTE — Telephone Encounter (Signed)
Please proceed to increased pressure as requested by patient

## 2018-03-19 NOTE — Telephone Encounter (Signed)
Spoke to patient regarding CPAP. She stated that she was at 6 but it's not strong enough and it was supposed to get changed to 8, but her machine is still reading 6. Spoke to [employer], they stated she is set 6-8, but they will change to 8. Called patient back and left a message on VM that this has been changed.

## 2018-03-20 DIAGNOSIS — B351 Tinea unguium: Secondary | ICD-10-CM | POA: Diagnosis not present

## 2018-03-20 DIAGNOSIS — M79672 Pain in left foot: Secondary | ICD-10-CM | POA: Diagnosis not present

## 2018-03-20 DIAGNOSIS — M79671 Pain in right foot: Secondary | ICD-10-CM | POA: Diagnosis not present

## 2018-03-21 ENCOUNTER — Ambulatory Visit (INDEPENDENT_AMBULATORY_CARE_PROVIDER_SITE_OTHER): Payer: Medicare Other | Admitting: Family

## 2018-03-21 ENCOUNTER — Encounter: Payer: Self-pay | Admitting: Family

## 2018-03-21 ENCOUNTER — Encounter: Payer: Self-pay | Admitting: Obstetrics and Gynecology

## 2018-03-21 ENCOUNTER — Ambulatory Visit (INDEPENDENT_AMBULATORY_CARE_PROVIDER_SITE_OTHER): Payer: Medicare Other | Admitting: Obstetrics and Gynecology

## 2018-03-21 VITALS — BP 124/70 | HR 63 | Ht 61.0 in | Wt 200.0 lb

## 2018-03-21 VITALS — BP 130/60 | HR 66 | Temp 98.0°F | Wt 201.4 lb

## 2018-03-21 DIAGNOSIS — N898 Other specified noninflammatory disorders of vagina: Secondary | ICD-10-CM | POA: Diagnosis not present

## 2018-03-21 DIAGNOSIS — I10 Essential (primary) hypertension: Secondary | ICD-10-CM

## 2018-03-21 LAB — POCT WET PREP WITH KOH
Clue Cells Wet Prep HPF POC: NEGATIVE
KOH Prep POC: NEGATIVE
Trichomonas, UA: NEGATIVE
Yeast Wet Prep HPF POC: NEGATIVE

## 2018-03-21 MED ORDER — FLUCONAZOLE 150 MG PO TABS: 150.0000 mg | ORAL_TABLET | Freq: Once | ORAL | 0 refills | Status: AC

## 2018-03-21 MED ORDER — AMLODIPINE BESYLATE 2.5 MG PO TABS: ORAL_TABLET | ORAL | 0 refills | Status: DC

## 2018-03-21 NOTE — Patient Instructions (Signed)
I value your feedback and entrusting us with your care. If you get a [facility] patient survey, I would appreciate you taking the time to let us know about your experience today. Thank you! 

## 2018-03-21 NOTE — Patient Instructions (Signed)
Pleasure seeing you today.  Pressures do look better.  Since your bottom number is on the low side, hesitant to add any more blood pressure medications.  May use the amlodipine as we discussed as an as needed medication for blood pressures greater than 140/70. We will focus on nutrition therapy, low-salt diet, weight loss in regard to lowering blood pressure. Today we discussed referrals, orders. nutrition   I have placed these orders in the system for you.  Please be sure to give [name] a call if you have not heard from our office regarding this. We should hear from [name] within ONE week with information regarding your appointment. If not, please let me know immediately.

## 2018-03-21 NOTE — Assessment & Plan Note (Signed)
Overall improved, "pressure" in head has resolved.  No symptoms today.  Discussed with patient at length that her diastolic runs on the low side, hesitant to increase blood pressure medications at this time.  We discussed diet, low-salt, weight loss as being first-line measures with goal of less than 073 systolic.  Patient will monitor at home.  Have given her low-dose amlodipine to take with parameters.  Will follow

## 2018-03-21 NOTE — Progress Notes (Signed)
Jennifer Hawthorne, FNP   Chief Complaint  Patient presents with  . Vaginitis    discharge, itching/irritation, no odor x since Feb    HPI:      [name] is a 68 y.o. No obstetric history on file. who LMP was No LMP recorded. Patient has had a hysterectomy., presents today for increased vag d/c with irritation/itch without odor for a month. Was on abx prior to sx and treated with monistat-7 2-3 wks ago with some improvement, but sx persist. No urin sx, LBP, fevers. Pt uses dial or body wash soap, no dryer sheets. She is not sex active.    Past Medical History:  Diagnosis Date  . Anxiety   . Constipation   . Elevated liver enzymes   . Hemorrhoids   . Herpes genitalis   . High cholesterol   . Hyperlipidemia   . Hypertension   . Hypothyroidism   . Osteoarthritis   . Sleep apnea     Past Surgical History:  Procedure Laterality Date  . ABDOMINAL HYSTERECTOMY     total for fibroids no h/o abnormal pap  . bariatric sleeve  2015  . BREAST EXCISIONAL BIOPSY Left 1998  . carpal tunnel repair    . COLONOSCOPY WITH PROPOFOL N/A 02/11/2016   Procedure: COLONOSCOPY WITH PROPOFOL;  Surgeon: Jonathon Bellows, MD;  Location: [facility] ENDOSCOPY;  Service: Endoscopy;  Laterality: N/A;  . HEMORRHOID SURGERY      Family History  Problem Relation Age of Onset  . Breast cancer Sister 21       materal 1/2 sister  . Hypertension Sister   . Hypertension Mother   . Heart disease Father   . Hypertension Brother     Social History   Socioeconomic History  . Marital status: Married    Spouse name: Not on file  . Number of children: Not on file  . Years of education: Not on file  . Highest education level: Not on file  Occupational History  . Not on file  Social Needs  . Financial resource strain: Not hard at all  . Food insecurity:    Worry: Never true    Inability: Never true  . Transportation needs:    Medical: No    Non-medical: No  Tobacco Use  . Smoking status:  Former Smoker    Packs/day: 1.50    Years: 24.00    Pack years: 36.00    Types: Cigarettes    Last attempt to quit: 1993    Years since quitting: 27.1  . Smokeless tobacco: Never Used  . Tobacco comment: quit 1995.   Substance and Sexual Activity  . Alcohol use: Yes    Alcohol/week: 14.0 standard drinks    Types: 14 Glasses of wine per week    Comment: occ  . Drug use: No  . Sexual activity: Not Currently    Birth control/protection: Surgical    Comment: Hysterectomy  Lifestyle  . Physical activity:    Days per week: Not on file    Minutes per session: Not on file  . Stress: Not on file  Relationships  . Social connections:    Talks on phone: Not on file    Gets together: Not on file    Attends religious service: Not on file    Active member of club or organization: Not on file    Attends meetings of clubs or organizations: Not on file    Relationship status: Not on file  .  Intimate partner violence:    Fear of current or ex partner: No    Emotionally abused: No    Physically abused: No    Forced sexual activity: No  Other Topics Concern  . Not on file  Social History Narrative   Lives in [place].    Married.    Retired 2015, Interior and spatial designer.    One son; granddaughter.     Outpatient Medications Prior to Visit  Medication Sig Dispense Refill  . acyclovir (ZOVIRAX) 400 MG tablet Take 1 tablet (400 mg total) by mouth daily. 90 tablet 1  . amLODipine (NORVASC) 2.5 MG tablet Take 2.5mg  IF blood pressure if above 140/70. 90 tablet 0  . aspirin 81 MG tablet Take 81 mg by mouth daily.    . cholecalciferol (VITAMIN D) 1000 UNITS tablet Take 400 Units by mouth daily.     . fexofenadine (ALLEGRA) 180 MG tablet Take 1 tablet (180 mg total) by mouth daily. 30 tablet 3  . fluticasone (FLONASE) 50 MCG/ACT nasal spray Place 1 spray into both nostrils daily. 16 g 2  . gabapentin (NEURONTIN) 100 MG capsule Take 1 capsule (100 mg total) by mouth at bedtime. 90 capsule 3    . hydrocortisone (ANUSOL-HC) 25 MG suppository Place 1 suppository (25 mg total) rectally 2 (two) times daily as needed for hemorrhoids or anal itching. 12 suppository 2  . levothyroxine (SYNTHROID, LEVOTHROID) 50 MCG tablet Take 1 tablet (50 mcg total) by mouth daily. 90 tablet 1  . lisinopril (PRINIVIL,ZESTRIL) 10 MG tablet Take 1 tablet (10 mg total) by mouth daily. 90 tablet 1  . Multiple Vitamins-Minerals (MULTIVITAMIN WITH MINERALS) tablet Take 1 tablet by mouth daily.    . naphazoline-pheniramine (ALLERGY EYE) 0.025-0.3 % ophthalmic solution Place 1 drop into both eyes 4 (four) times daily as needed for eye irritation or allergies. 15 mL 1  . omeprazole (PRILOSEC) 20 MG capsule Take 1 capsule (20 mg total) by mouth daily. 90 capsule 1  . rosuvastatin (CRESTOR) 40 MG tablet Take 1 tablet (40 mg total) by mouth daily. 90 tablet 2  . sodium chloride (OCEAN) 0.65 % SOLN nasal spray Place 1 spray into both nostrils as needed for congestion.    . traMADol (ULTRAM) 50 MG tablet TAKE 1 2 TABS EVERY 4 6 HOURS AS NEEDED FOR PAIN    . traZODone (DESYREL) 100 MG tablet Take 1 tablet (100 mg total) by mouth at bedtime. 300 tablet 3   No facility-administered medications prior to visit.       ROS:  Review of Systems  Constitutional: Negative for fever.  Gastrointestinal: Negative for blood in stool, constipation, diarrhea, nausea and vomiting.  Genitourinary: Positive for vaginal discharge. Negative for dyspareunia, dysuria, flank pain, frequency, hematuria, urgency, vaginal bleeding and vaginal pain.  Musculoskeletal: Negative for back pain.  Skin: Negative for rash.   BREAST: No symptoms   OBJECTIVE:   Vitals:  BP 124/70   Pulse 63   Ht 5\' 1"  (1.549 m)   Wt 200 lb (90.7 kg)   BMI 37.79 kg/m   Physical Exam Vitals signs reviewed.  Constitutional:      Appearance: She is well-developed.  Pulmonary:     Effort: Pulmonary effort is normal.  Genitourinary:    Pubic Area: No rash.       Labia:        Right: No rash, tenderness or lesion.        Left: Tenderness present. No rash or lesion.  Vagina: Normal. No vaginal discharge, erythema or tenderness.     Uterus: Absent. Not tender.      Adnexa: Right adnexa normal and left adnexa normal.       Right: No mass or tenderness.         Left: No mass or tenderness.      Musculoskeletal: Normal range of motion.  Neurological:     Mental Status: She is alert and oriented to person, place, and time.  Psychiatric:        Behavior: Behavior normal.        Thought Content: Thought content normal.     Results: Results for orders placed or performed in visit on 03/21/18 (from the past 24 hour(s))  POCT Wet Prep with KOH     Status: Normal   Collection Time: 03/21/18  2:42 PM  Result Value Ref Range   Trichomonas, UA Negative    Clue Cells Wet Prep HPF POC neg    Epithelial Wet Prep HPF POC     Yeast Wet Prep HPF POC neg    Bacteria Wet Prep HPF POC     RBC Wet Prep HPF POC     WBC Wet Prep HPF POC     KOH Prep POC Negative Negative     Assessment/Plan: Vaginal irritation - Neg wet prep, pos excoritations. Rx diflucan to treat empirically. Change to dove sens skin soap, OTC hydrocortisone crm prn, cold compresses. F/u prn - Plan: POCT Wet Prep with KOH, fluconazole (DIFLUCAN) 150 MG tablet    Meds ordered this encounter  Medications  . fluconazole (DIFLUCAN) 150 MG tablet    Sig: Take 1 tablet (150 mg total) by mouth once for 1 dose.    Dispense:  1 tablet    Refill:  0    Order Specific Question:   Supervising Provider    Answer:   Gae Dry [[id number]]      Return if symptoms worsen or fail to improve.  Rishon Thilges B. Jayde Daffin, PA-C 03/21/2018 2:43 PM

## 2018-03-21 NOTE — Progress Notes (Signed)
Subjective:    Patient ID: Jennifer [name], female    DOB: 03/21/1950, 68 y.o.   MRN: [id number]  CC: Jennifer [name] is a 68 y.o. female who presents today for follow up.   HPI: HTN- has been trending down. End of February, readings as high 170/88, variable to 133/61, 147/87 and 135/81 Last reading at home 03/15/18 128/73. Past few days, blood pressure has been better. Notes had great toe nails removed and thinks pain may have been contributory.  A couple of days had 'pressure' on top of head, notes this when blood pressure was increased. Not severe pain. States 'not a headache.'  Resolved. Denies exertional chest pain or pressure, numbness or tingling radiating to left arm or jaw, palpitations, dizziness, frequent headaches, changes in vision, or shortness of breath.  No decongestants, ibuprofen.  Notes indiscretion with salt, including sandwich meats.   H/o sinus headaches.  On Allegra.   Wearing cipap machine. Pressure has been increased to 8, working well for patient.       HISTORY:  Past Medical History:  Diagnosis Date  . Anxiety   . Constipation   . Elevated liver enzymes   . Hemorrhoids   . Herpes genitalis   . High cholesterol   . Hyperlipidemia   . Hypertension   . Hypothyroidism   . Osteoarthritis   . Sleep apnea    Past Surgical History:  Procedure Laterality Date  . ABDOMINAL HYSTERECTOMY     total for fibroids no h/o abnormal pap  . bariatric sleeve  2015  . BREAST EXCISIONAL BIOPSY Left 1998  . carpal tunnel repair    . COLONOSCOPY WITH PROPOFOL N/A 02/11/2016   Procedure: COLONOSCOPY WITH PROPOFOL;  Surgeon: Jonathon Bellows, MD;  Location: [facility] ENDOSCOPY;  Service: Endoscopy;  Laterality: N/A;  . HEMORRHOID SURGERY     Family History  Problem Relation Age of Onset  . Breast cancer Sister 3       materal 1/2 sister  . Hypertension Sister   . Hypertension Mother   . Heart disease Father   . Hypertension Brother     Allergies: Celecoxib; Pollen  extract; Nasacort [triamcinolone]; and Vicodin [hydrocodone-acetaminophen] Current Outpatient Medications on File Prior to Visit  Medication Sig Dispense Refill  . acyclovir (ZOVIRAX) 400 MG tablet Take 1 tablet (400 mg total) by mouth daily. 90 tablet 1  . aspirin 81 MG tablet Take 81 mg by mouth daily.    . cholecalciferol (VITAMIN D) 1000 UNITS tablet Take 400 Units by mouth daily.     . fexofenadine (ALLEGRA) 180 MG tablet Take 1 tablet (180 mg total) by mouth daily. 30 tablet 3  . fluticasone (FLONASE) 50 MCG/ACT nasal spray Place 1 spray into both nostrils daily. 16 g 2  . gabapentin (NEURONTIN) 100 MG capsule Take 1 capsule (100 mg total) by mouth at bedtime. 90 capsule 3  . hydrocortisone (ANUSOL-HC) 25 MG suppository Place 1 suppository (25 mg total) rectally 2 (two) times daily as needed for hemorrhoids or anal itching. 12 suppository 2  . levothyroxine (SYNTHROID, LEVOTHROID) 50 MCG tablet Take 1 tablet (50 mcg total) by mouth daily. 90 tablet 1  . lisinopril (PRINIVIL,ZESTRIL) 10 MG tablet Take 1 tablet (10 mg total) by mouth daily. 90 tablet 1  . Multiple Vitamins-Minerals (MULTIVITAMIN WITH MINERALS) tablet Take 1 tablet by mouth daily.    . naphazoline-pheniramine (ALLERGY EYE) 0.025-0.3 % ophthalmic solution Place 1 drop into both eyes 4 (four) times daily as needed for eye irritation or  allergies. 15 mL 1  . omeprazole (PRILOSEC) 20 MG capsule Take 1 capsule (20 mg total) by mouth daily. 90 capsule 1  . rosuvastatin (CRESTOR) 40 MG tablet Take 1 tablet (40 mg total) by mouth daily. 90 tablet 2  . traZODone (DESYREL) 100 MG tablet Take 1 tablet (100 mg total) by mouth at bedtime. 300 tablet 3  . sodium chloride (OCEAN) 0.65 % SOLN nasal spray Place 1 spray into both nostrils as needed for congestion.     No current facility-administered medications on file prior to visit.     Social History   Tobacco Use  . Smoking status: Former Smoker    Packs/day: 1.50    Years: 24.00     Pack years: 36.00    Types: Cigarettes    Last attempt to quit: 1993    Years since quitting: 27.1  . Smokeless tobacco: Never Used  . Tobacco comment: quit 1995.   Substance Use Topics  . Alcohol use: Yes    Alcohol/week: 14.0 standard drinks    Types: 14 Glasses of wine per week    Comment: occ  . Drug use: No    Review of Systems  Constitutional: Negative for chills and fever.  Eyes: Negative for visual disturbance.  Respiratory: Negative for cough.   Cardiovascular: Negative for chest pain and palpitations.  Gastrointestinal: Negative for nausea and vomiting.  Neurological: Negative for headaches.      Objective:    BP 130/60   Pulse 66   Temp 98 F (36.7 C)   Wt 201 lb 6.4 oz (91.4 kg)   SpO2 96%   BMI 38.05 kg/m  BP Readings from Last 3 Encounters:  03/21/18 130/60  03/07/18 (!) 149/69  03/01/18 124/72   Wt Readings from Last 3 Encounters:  03/21/18 201 lb 6.4 oz (91.4 kg)  03/07/18 200 lb (90.7 kg)  03/01/18 200 lb (90.7 kg)    Physical Exam Vitals signs reviewed.  Constitutional:      Appearance: She is well-developed.  Eyes:     Conjunctiva/sclera: Conjunctivae normal.  Cardiovascular:     Rate and Rhythm: Normal rate and regular rhythm.     Pulses: Normal pulses.     Heart sounds: Normal heart sounds.  Pulmonary:     Effort: Pulmonary effort is normal.     Breath sounds: Normal breath sounds. No wheezing, rhonchi or rales.  Skin:    General: Skin is warm and dry.  Neurological:     Mental Status: She is alert.  Psychiatric:        Speech: Speech normal.        Behavior: Behavior normal.        Thought Content: Thought content normal.        Assessment & Plan:   Problem List Items Addressed This Visit      Cardiovascular and Mediastinum   Essential hypertension - Primary    Overall improved, "pressure" in head has resolved.  No symptoms today.  Discussed with patient at length that her diastolic runs on the low side, hesitant to  increase blood pressure medications at this time.  We discussed diet, low-salt, weight loss as being first-line measures with goal of less than 130 systolic.  Patient will monitor at home.  Have given her low-dose amlodipine to take with parameters.  Will follow      Relevant Medications   amLODipine (NORVASC) 2.5 MG tablet   Other Relevant Orders   Referral to Nutrition and Diabetes Services  I have discontinued Colleen Fross's Biotin and acetaminophen. I am also having her start on amLODipine. Additionally, I am having her maintain her aspirin, cholecalciferol, multivitamin with minerals, sodium chloride, hydrocortisone, gabapentin, fexofenadine, fluticasone, naphazoline-pheniramine, rosuvastatin, omeprazole, levothyroxine, acyclovir, lisinopril, and traZODone.   Meds ordered this encounter  Medications  . amLODipine (NORVASC) 2.5 MG tablet    Sig: Take 2.5mg  IF blood pressure if above 140/70.    Dispense:  90 tablet    Refill:  0    Order Specific Question:   Supervising Provider    Answer:   Crecencio Mc [[id number]]    Return precautions given.   Risks, benefits, and alternatives of the medications and treatment plan prescribed today were discussed, and patient expressed understanding.   Education regarding symptom management and diagnosis given to patient on AVS.  Continue to follow with [name], FNP for routine health maintenance.   Jennifer [name] and I agreed with plan.   [name], FNP

## 2018-03-29 DIAGNOSIS — I10 Essential (primary) hypertension: Secondary | ICD-10-CM | POA: Diagnosis not present

## 2018-03-29 DIAGNOSIS — R9431 Abnormal electrocardiogram [ECG] [EKG]: Secondary | ICD-10-CM | POA: Diagnosis not present

## 2018-03-29 DIAGNOSIS — R002 Palpitations: Secondary | ICD-10-CM | POA: Diagnosis not present

## 2018-03-29 DIAGNOSIS — R42 Dizziness and giddiness: Secondary | ICD-10-CM | POA: Diagnosis not present

## 2018-03-29 DIAGNOSIS — R001 Bradycardia, unspecified: Secondary | ICD-10-CM | POA: Diagnosis not present

## 2018-03-29 DIAGNOSIS — R5383 Other fatigue: Secondary | ICD-10-CM | POA: Diagnosis not present

## 2018-03-29 DIAGNOSIS — E669 Obesity, unspecified: Secondary | ICD-10-CM | POA: Diagnosis not present

## 2018-03-29 DIAGNOSIS — R600 Localized edema: Secondary | ICD-10-CM | POA: Diagnosis not present

## 2018-03-29 DIAGNOSIS — R011 Cardiac murmur, unspecified: Secondary | ICD-10-CM | POA: Diagnosis not present

## 2018-03-29 DIAGNOSIS — R0602 Shortness of breath: Secondary | ICD-10-CM | POA: Diagnosis not present

## 2018-03-29 DIAGNOSIS — E7849 Other hyperlipidemia: Secondary | ICD-10-CM | POA: Diagnosis not present

## 2018-03-29 DIAGNOSIS — G4733 Obstructive sleep apnea (adult) (pediatric): Secondary | ICD-10-CM | POA: Diagnosis not present

## 2018-04-03 DIAGNOSIS — R748 Abnormal levels of other serum enzymes: Secondary | ICD-10-CM | POA: Diagnosis not present

## 2018-04-03 DIAGNOSIS — K5909 Other constipation: Secondary | ICD-10-CM | POA: Diagnosis not present

## 2018-04-03 DIAGNOSIS — R1312 Dysphagia, oropharyngeal phase: Secondary | ICD-10-CM | POA: Diagnosis not present

## 2018-04-03 DIAGNOSIS — K648 Other hemorrhoids: Secondary | ICD-10-CM | POA: Diagnosis not present

## 2018-04-03 DIAGNOSIS — K760 Fatty (change of) liver, not elsewhere classified: Secondary | ICD-10-CM | POA: Diagnosis not present

## 2018-04-10 ENCOUNTER — Other Ambulatory Visit: Payer: Self-pay | Admitting: Family

## 2018-04-11 ENCOUNTER — Other Ambulatory Visit: Payer: Self-pay | Admitting: Family

## 2018-04-11 DIAGNOSIS — M79671 Pain in right foot: Secondary | ICD-10-CM

## 2018-05-07 ENCOUNTER — Encounter: Payer: Self-pay | Admitting: Family

## 2018-05-07 ENCOUNTER — Ambulatory Visit (INDEPENDENT_AMBULATORY_CARE_PROVIDER_SITE_OTHER): Payer: Medicare Other | Admitting: Family

## 2018-05-07 ENCOUNTER — Other Ambulatory Visit: Payer: Self-pay | Admitting: Family

## 2018-05-07 ENCOUNTER — Other Ambulatory Visit: Payer: Self-pay

## 2018-05-07 DIAGNOSIS — I10 Essential (primary) hypertension: Secondary | ICD-10-CM | POA: Diagnosis not present

## 2018-05-07 DIAGNOSIS — M7989 Other specified soft tissue disorders: Secondary | ICD-10-CM

## 2018-05-07 DIAGNOSIS — R609 Edema, unspecified: Secondary | ICD-10-CM

## 2018-05-07 LAB — BASIC METABOLIC PANEL
BUN: 15 mg/dL (ref 6–23)
CO2: 28 mEq/L (ref 19–32)
Calcium: 9.8 mg/dL (ref 8.4–10.5)
Chloride: 105 mEq/L (ref 96–112)
Creatinine, Ser: 0.87 mg/dL (ref 0.40–1.20)
GFR: 78.18 mL/min (ref >60.00)
Glucose, Bld: 94 mg/dL (ref 70–99)
Potassium: 3.5 mEq/L (ref 3.5–5.1)
Sodium: 141 mEq/L (ref 135–145)

## 2018-05-07 LAB — BRAIN NATRIURETIC PEPTIDE: Pro B Natriuretic peptide (BNP): 10 pg/mL (ref 0.0–100.0)

## 2018-05-07 MED ORDER — POTASSIUM CHLORIDE ER 10 MEQ PO TBCR: 10.0000 meq | EXTENDED_RELEASE_TABLET | Freq: Every day | ORAL | 0 refills | Status: DC

## 2018-05-07 NOTE — Patient Instructions (Addendum)
Increase to gabapentin to 100 mg morning, 100mg  at lunch/midday and then 200mg  at bedtime.  Likely we can gradually move to 300mg  three times per day if needed.   Make sure you are not extra sleepy on this medication.   Labs today.  In regards to foot pain, you may also speak with Dr Vickki Muff about this as may be component plantar fasciitis.   Please start wearing compression stockings and ELEVATE legs - TOES above nose! Low salt.  Follow up on Friday at 1030 with virtual appointment.      Peripheral Edema  Peripheral edema is swelling that is caused by a buildup of fluid. Peripheral edema most often affects the lower legs, ankles, and feet. It can also develop in the arms, hands, and face. The area of the body that has peripheral edema will look swollen. It may also feel heavy or warm. Your clothes may start to feel tight. Pressing on the area may make a temporary dent in your skin. You may not be able to move your arm or leg as much as usual. There are many causes of peripheral edema. It can be a complication of other diseases, such as congestive heart failure, kidney disease, or a problem with your blood circulation. It also can be a side effect of certain medicines. It often happens to women during pregnancy. Sometimes, the cause is not known. Treating the underlying condition is often the only treatment for peripheral edema. Follow these instructions at home: Pay attention to any changes in your symptoms. Take these actions to help with your discomfort:  Raise (elevate) your legs while you are sitting or lying down.  Move around often to prevent stiffness and to lessen swelling. Do not sit or stand for long periods of time.  Wear support stockings as told by your health care provider.  Follow instructions from your health care provider about limiting salt (sodium) in your diet. Sometimes eating less salt can reduce swelling.  Take over-the-counter and prescription medicines only as  told by your health care provider. Your health care provider may prescribe medicine to help your body get rid of excess water (diuretic).  Keep all follow-up visits as told by your health care provider. This is important. Contact a health care provider if:  You have a fever.  Your edema starts suddenly or is getting worse, especially if you are pregnant or have a medical condition.  You have swelling in only one leg.  You have increased swelling and pain in your legs. Get help right away if:  You develop shortness of breath, especially when you are lying down.  You have pain in your chest or abdomen.  You feel weak.  You faint. This information is not intended to replace advice given to you by your health care provider. Make sure you discuss any questions you have with your health care provider. Document Released: 02/11/2004 Document Revised: 06/08/2015 Document Reviewed: 07/16/2014 Elsevier Interactive Patient Education  2019 [employer].

## 2018-05-07 NOTE — Progress Notes (Signed)
This visit type was conducted due to national recommendations for restrictions regarding the COVID-19 pandemic (e.g. social distancing).  This format is felt to be most appropriate for this patient at this time.  All issues noted in this document were discussed and addressed.  No physical exam was performed (except for noted visual exam findings with Video Visits). Virtual Visit via Video Note  I connected with@  on 05/09/18 at  1:15 PM EDT by a video enabled telemedicine application and verified that I am speaking with the correct person using two identifiers.  Location patient: home Location provider:work or home office Persons participating in the virtual visit: patient, provider  I discussed the limitations of evaluation and management by telemedicine and the availability of in person appointments. The patient expressed understanding and agreed to proceed.  This visit type was conducted due to national recommendations for restrictions regarding the COVID-19 pandemic (e.g. social distancing).  This format is felt to be most appropriate for this patient at this time.  All issues noted in this document were discussed and addressed.    Of note, patient drove to our  office . Since we were unable to do Doxy, even after CMA Judson Roch) went to parking lot to try to help her. We decided to proceed with phone for HPI, and that I would walk to parking lot to assess patient.   HPI:  CC: leg swelling bilateral ankles and 'top of foot' x 'for years, however worsened this past month'. Notes improvement over past couple of days.   Worse after long periods of standing.  Doesn't improve after laying supine for sleep. Not elevating legs. Had wore compression stockings in the past with mixed results.   Notes cleaning refrigerator x 3 days ago, however this made it worse.  No CP, SOB, fever, erythema, lacerations, orthopnea.   Legs are swollen 'equally.'   Feet are hurting from 'neuropathy' and taking  gabapentin , 200mg  qhs.   Notes that 'back of ankle' hurts. Feels like walking on rocks. No falls.   Notes she has had both great toe nail removed due to 'fungus' ; this was done 03/20/18 with Dr Vickki Muff.   Loosing weight , now 192 lbs.   Started on lasix by Dr Clayborn Bigness 03/29/18, taking once per day for past month, without relief.   HTN- 120/ 70.  Compliant with medication.  No h/o HF per patient.  2018 Echo LV 55%.   Follows with Dr Clayborn Bigness.    ROS: See pertinent positives and negatives per HPI.  Past Medical History:  Diagnosis Date  . Anxiety   . Constipation   . Elevated liver enzymes   . Hemorrhoids   . Herpes genitalis   . High cholesterol   . Hyperlipidemia   . Hypertension   . Hypothyroidism   . Osteoarthritis   . Sleep apnea     Past Surgical History:  Procedure Laterality Date  . ABDOMINAL HYSTERECTOMY     total for fibroids no h/o abnormal pap  . bariatric sleeve  2015  . BREAST EXCISIONAL BIOPSY Left 1998  . carpal tunnel repair    . COLONOSCOPY WITH PROPOFOL N/A 02/11/2016   Procedure: COLONOSCOPY WITH PROPOFOL;  Surgeon: Jonathon Bellows, MD;  Location: [facility] ENDOSCOPY;  Service: Endoscopy;  Laterality: N/A;  . HEMORRHOID SURGERY      Family History  Problem Relation Age of Onset  . Breast cancer Sister 42       materal 1/2 sister  . Hypertension Sister   .  Hypertension Mother   . Heart disease Father   . Hypertension Brother     SOCIAL HX: former smoker   Current Outpatient Medications:  .  acyclovir (ZOVIRAX) 400 MG tablet, Take 1 tablet (400 mg total) by mouth daily., Disp: 90 tablet, Rfl: 1 .  amLODipine (NORVASC) 2.5 MG tablet, Take 2.5mg  IF blood pressure if above 140/70., Disp: 90 tablet, Rfl: 0 .  aspirin 81 MG tablet, Take 81 mg by mouth daily., Disp: , Rfl:  .  cholecalciferol (VITAMIN D) 1000 UNITS tablet, Take 400 Units by mouth daily. , Disp: , Rfl:  .  fexofenadine (ALLEGRA) 180 MG tablet, Take 1 tablet (180 mg total) by mouth daily.,  Disp: 30 tablet, Rfl: 3 .  fluticasone (FLONASE) 50 MCG/ACT nasal spray, Place 1 spray into both nostrils daily., Disp: 16 g, Rfl: 2 .  furosemide (LASIX) 20 MG tablet, Take 20 mg by mouth daily., Disp: , Rfl:  .  gabapentin (NEURONTIN) 100 MG capsule, TAKE 1 CAPSULE (100 MG TOTAL) BY MOUTH AT BEDTIME., Disp: 90 capsule, Rfl: 4 .  hydrocortisone (ANUSOL-HC) 25 MG suppository, Place 1 suppository (25 mg total) rectally 2 (two) times daily as needed for hemorrhoids or anal itching., Disp: 12 suppository, Rfl: 2 .  levothyroxine (SYNTHROID, LEVOTHROID) 50 MCG tablet, Take 1 tablet (50 mcg total) by mouth daily., Disp: 90 tablet, Rfl: 1 .  lisinopril (PRINIVIL,ZESTRIL) 10 MG tablet, Take 1 tablet (10 mg total) by mouth daily., Disp: 90 tablet, Rfl: 1 .  Multiple Vitamins-Minerals (MULTIVITAMIN WITH MINERALS) tablet, Take 1 tablet by mouth daily., Disp: , Rfl:  .  naphazoline-pheniramine (ALLERGY EYE) 0.025-0.3 % ophthalmic solution, Place 1 drop into both eyes 4 (four) times daily as needed for eye irritation or allergies., Disp: 15 mL, Rfl: 1 .  omeprazole (PRILOSEC) 20 MG capsule, Take 1 capsule (20 mg total) by mouth daily., Disp: 90 capsule, Rfl: 1 .  potassium chloride (K-DUR) 10 MEQ tablet, Take 1 tablet (10 mEq total) by mouth daily. Only when taking furosemide ( Lasix)., Disp: 90 tablet, Rfl: 0 .  rosuvastatin (CRESTOR) 40 MG tablet, Take 1 tablet (40 mg total) by mouth daily., Disp: 90 tablet, Rfl: 2 .  sodium chloride (OCEAN) 0.65 % SOLN nasal spray, Place 1 spray into both nostrils as needed for congestion., Disp: , Rfl:  .  traMADol (ULTRAM) 50 MG tablet, TAKE 1 2 TABS EVERY 4 6 HOURS AS NEEDED FOR PAIN, Disp: , Rfl:  .  traZODone (DESYREL) 100 MG tablet, TAKE 1 TABLET BY MOUTH EVERYDAY AT BEDTIME, Disp: 90 tablet, Rfl: 14  EXAM:  VITALS per patient if applicable:  GENERAL: alert, oriented, appears well and in no acute distress  HEENT: atraumatic, conjunttiva clear, no obvious  abnormalities on inspection of external nose and ears  NECK: normal movements of the head and neck  LUNGS: on inspection no signs of respiratory distress, breathing rate appears normal, no obvious gross SOB, gasping or wheezing  CV: no obvious cyanosis.  Trace non pitting pedal edema appreciated on exam bilaterally.  No erythema, increased warmth.  Varicosities appreciated.  Calf size is symmetric. skin intact  MS: moves all visible extremities without noticeable abnormality  PSYCH/NEURO: pleasant and cooperative, no obvious depression or anxiety, speech and thought processing grossly intact  ASSESSMENT AND PLAN:  Discussed the following assessment and plan:  Leg swelling - Plan: Basic metabolic panel, B Nat Peptide  Essential hypertension  Problem List Items Addressed This Visit      Cardiovascular  and Mediastinum   Essential hypertension    Well-controlled, continue current regimen        Other   Leg swelling - Primary    Improving.  No Shortness of breath.  Advise compression stockings, elevation.  suspect neuropathy is playing a role.  Advised increase gabapentin.  No evidence of infection.  Some consideration for plantar fasciitis, patient will follow-up with podiatrist, Dr. Vickki Muff . pending labs. Close follow up.       Relevant Orders   Basic metabolic panel (Completed)   B Nat Peptide (Completed)        I discussed the assessment and treatment plan with the patient. The patient was provided an opportunity to ask questions and all were answered. The patient agreed with the plan and demonstrated an understanding of the instructions.   The patient was advised to call back or seek an in-person evaluation if the symptoms worsen or if the condition fails to improve as anticipated.   [name], FNP

## 2018-05-09 DIAGNOSIS — M7989 Other specified soft tissue disorders: Secondary | ICD-10-CM | POA: Insufficient documentation

## 2018-05-09 NOTE — Assessment & Plan Note (Addendum)
Improving.  No Shortness of breath.  Advise compression stockings, elevation.  suspect neuropathy is playing a role.  Advised increase gabapentin.  No evidence of infection.  Some consideration for plantar fasciitis, patient will follow-up with podiatrist, Dr. Vickki Muff . pending labs. Close follow up.

## 2018-05-09 NOTE — Assessment & Plan Note (Signed)
Well-controlled, continue current regimen 

## 2018-05-10 DIAGNOSIS — M7741 Metatarsalgia, right foot: Secondary | ICD-10-CM | POA: Diagnosis not present

## 2018-05-10 DIAGNOSIS — M7742 Metatarsalgia, left foot: Secondary | ICD-10-CM | POA: Diagnosis not present

## 2018-05-11 ENCOUNTER — Ambulatory Visit (INDEPENDENT_AMBULATORY_CARE_PROVIDER_SITE_OTHER): Payer: Medicare Other | Admitting: Family

## 2018-05-11 ENCOUNTER — Encounter: Payer: Self-pay | Admitting: Family

## 2018-05-11 DIAGNOSIS — G629 Polyneuropathy, unspecified: Secondary | ICD-10-CM | POA: Diagnosis not present

## 2018-05-11 DIAGNOSIS — M7989 Other specified soft tissue disorders: Secondary | ICD-10-CM | POA: Diagnosis not present

## 2018-05-11 NOTE — Progress Notes (Signed)
Verbal consent for services obtained from patient prior to services given.  Location of call:  provider at work patient at home  Names of all persons present for services: [name], NP Chief complaint:    I connected with@  on 05/11/18 at 10:30 AM EDT by a video enabled telemedicine application and verified that I am speaking with the correct person using two identifiers.  Location patient: home Location provider:work  Persons participating in the virtual visit: patient, provider  I discussed the limitations of evaluation and management by telemedicine and the availability of in person appointments. The patient expressed understanding and agreed to proceed.   HPI: Swelling has improved. Still 'puffy' on side of both feet. Seen 4 days ago for LE edema. Increased gabapentin with some relief. Numbness in BL toes. No numbness in groin. No SOB. No trouble urinating or stool incontinence. Hasnt used amlodipine is some time.  Some pain in calves with walking.    Feels weakness in lower legs. No falls.   Has been elevating  legs, compression stockings. On lasix 20mg  everyday.   has seen podiatry yesterday; has yet to start prednisone, arthrotec. Reviewed note.   MRI lumbar 2018- disc narrowing L3-4, and mild disc bulging L4-5, .    ROS: See pertinent positives and negatives per HPI.  Past Medical History:  Diagnosis Date  . Anxiety   . Constipation   . Elevated liver enzymes   . Hemorrhoids   . Herpes genitalis   . High cholesterol   . Hyperlipidemia   . Hypertension   . Hypothyroidism   . Osteoarthritis   . Sleep apnea     Past Surgical History:  Procedure Laterality Date  . ABDOMINAL HYSTERECTOMY     total for fibroids no h/o abnormal pap  . bariatric sleeve  2015  . BREAST EXCISIONAL BIOPSY Left 1998  . carpal tunnel repair    . COLONOSCOPY WITH PROPOFOL N/A 02/11/2016   Procedure: COLONOSCOPY WITH PROPOFOL;  Surgeon: Jonathon Bellows, MD;  Location: [facility]  ENDOSCOPY;  Service: Endoscopy;  Laterality: N/A;  . HEMORRHOID SURGERY      Family History  Problem Relation Age of Onset  . Breast cancer Sister 16       materal 1/2 sister  . Hypertension Sister   . Hypertension Mother   . Heart disease Father   . Hypertension Brother     SOCIAL HX: former smoker   Current Outpatient Medications:  .  acyclovir (ZOVIRAX) 400 MG tablet, Take 1 tablet (400 mg total) by mouth daily., Disp: 90 tablet, Rfl: 1 .  amLODipine (NORVASC) 2.5 MG tablet, Take 2.5mg  IF blood pressure if above 140/70., Disp: 90 tablet, Rfl: 0 .  aspirin 81 MG tablet, Take 81 mg by mouth daily., Disp: , Rfl:  .  cholecalciferol (VITAMIN D) 1000 UNITS tablet, Take 400 Units by mouth daily. , Disp: , Rfl:  .  Diclofenac-miSOPROStol 75-0.2 MG TBEC, Take by mouth., Disp: , Rfl:  .  fexofenadine (ALLEGRA) 180 MG tablet, Take 1 tablet (180 mg total) by mouth daily., Disp: 30 tablet, Rfl: 3 .  fluticasone (FLONASE) 50 MCG/ACT nasal spray, Place 1 spray into both nostrils daily., Disp: 16 g, Rfl: 2 .  furosemide (LASIX) 20 MG tablet, Take 20 mg by mouth daily., Disp: , Rfl:  .  gabapentin (NEURONTIN) 100 MG capsule, TAKE 1 CAPSULE (100 MG TOTAL) BY MOUTH AT BEDTIME., Disp: 90 capsule, Rfl: 4 .  hydrocortisone (ANUSOL-HC) 25 MG suppository, Place 1 suppository (25  mg total) rectally 2 (two) times daily as needed for hemorrhoids or anal itching., Disp: 12 suppository, Rfl: 2 .  levothyroxine (SYNTHROID, LEVOTHROID) 50 MCG tablet, Take 1 tablet (50 mcg total) by mouth daily., Disp: 90 tablet, Rfl: 1 .  lisinopril (PRINIVIL,ZESTRIL) 10 MG tablet, Take 1 tablet (10 mg total) by mouth daily., Disp: 90 tablet, Rfl: 1 .  methylPREDNISolone (MEDROL DOSEPAK) 4 MG TBPK tablet, Take 4 mg by mouth., Disp: , Rfl:  .  Multiple Vitamins-Minerals (MULTIVITAMIN WITH MINERALS) tablet, Take 1 tablet by mouth daily., Disp: , Rfl:  .  naphazoline-pheniramine (ALLERGY EYE) 0.025-0.3 % ophthalmic solution, Place  1 drop into both eyes 4 (four) times daily as needed for eye irritation or allergies., Disp: 15 mL, Rfl: 1 .  omeprazole (PRILOSEC) 20 MG capsule, Take 1 capsule (20 mg total) by mouth daily., Disp: 90 capsule, Rfl: 1 .  potassium chloride (K-DUR) 10 MEQ tablet, Take 1 tablet (10 mEq total) by mouth daily. Only when taking furosemide ( Lasix)., Disp: 90 tablet, Rfl: 0 .  rosuvastatin (CRESTOR) 40 MG tablet, Take 1 tablet (40 mg total) by mouth daily., Disp: 90 tablet, Rfl: 2 .  sodium chloride (OCEAN) 0.65 % SOLN nasal spray, Place 1 spray into both nostrils as needed for congestion., Disp: , Rfl:  .  traZODone (DESYREL) 100 MG tablet, TAKE 1 TABLET BY MOUTH EVERYDAY AT BEDTIME, Disp: 90 tablet, Rfl: 14   ASSESSMENT AND PLAN:  Discussed the following assessment and plan:  Peripheral polyneuropathy - Plan: Ambulatory referral to Orthopedic Surgery, Ambulatory referral to Vascular Surgery, B12 and Folate Panel  Leg swelling  Problem List Items Addressed This Visit      Nervous and Auditory   Peripheral neuropathy - Primary    Unchanged. Discussed whether neuropathy may be from a lumbar spine etiology, and have agreed to consult [facility] again.  Patient will let me know how she is doing      Relevant Orders   Ambulatory referral to Orthopedic Surgery   Ambulatory referral to Vascular Surgery   B12 and Folate Panel     Other   Leg swelling    Pleased improved. Will continue conservative management and vigilence. We also discussed whether edema is dependent or venous in etiology; pending consult with vascular.           I discussed the assessment and treatment plan with the patient. The patient was provided an opportunity to ask questions and all were answered. The patient agreed with the plan and demonstrated an understanding of the instructions.   The patient was advised to call back or seek an in-person evaluation if the symptoms worsen or if the condition fails to improve as  anticipated.   [name], FNP  I spent 20  min non face to face w/ pt

## 2018-05-11 NOTE — Progress Notes (Signed)
I have called patient & she is scheduled to come in Monday for labs.

## 2018-05-11 NOTE — Patient Instructions (Addendum)
Please overall the swelling has improved however it is not completely resolved.  I would advise you continue conservative therapy including compression stockings, elevation.  I would reserve any increase dose of Lasix if more severe or if it worsens.  Today we discussed referrals, orders.  Vascular, orthopedics.    I have placed these orders in the system for you.  Please be sure to give [name] a call if you have not heard from our office regarding this. We should hear from [name] within ONE week with information regarding your appointment. If not, please let me know immediately.    Please let me know how you are doing and if you need anything at all.

## 2018-05-11 NOTE — Assessment & Plan Note (Signed)
Unchanged. Discussed whether neuropathy may be from a lumbar spine etiology, and have agreed to consult [facility] again.  Patient will let me know how she is doing

## 2018-05-11 NOTE — Assessment & Plan Note (Addendum)
Pleased improved. Will continue conservative management and vigilence. We also discussed whether edema is dependent or venous in etiology; pending consult with vascular.

## 2018-05-14 ENCOUNTER — Other Ambulatory Visit: Payer: Self-pay

## 2018-05-14 ENCOUNTER — Other Ambulatory Visit (INDEPENDENT_AMBULATORY_CARE_PROVIDER_SITE_OTHER): Payer: Medicare Other

## 2018-05-14 DIAGNOSIS — G629 Polyneuropathy, unspecified: Secondary | ICD-10-CM | POA: Diagnosis not present

## 2018-05-14 LAB — B12 AND FOLATE PANEL
Folate: 15.7 ng/mL (ref >5.9)
Vitamin B-12: 357 pg/mL (ref 211–911)

## 2018-05-17 DIAGNOSIS — M47816 Spondylosis without myelopathy or radiculopathy, lumbar region: Secondary | ICD-10-CM | POA: Insufficient documentation

## 2018-05-17 DIAGNOSIS — M5136 Other intervertebral disc degeneration, lumbar region: Secondary | ICD-10-CM | POA: Diagnosis not present

## 2018-05-17 DIAGNOSIS — M5416 Radiculopathy, lumbar region: Secondary | ICD-10-CM | POA: Diagnosis not present

## 2018-05-17 DIAGNOSIS — M545 Low back pain: Secondary | ICD-10-CM | POA: Diagnosis not present

## 2018-05-22 ENCOUNTER — Other Ambulatory Visit: Payer: Self-pay

## 2018-05-22 ENCOUNTER — Encounter: Payer: Self-pay | Admitting: Dietician

## 2018-05-22 ENCOUNTER — Encounter: Payer: Medicare Other | Attending: Family | Admitting: Dietician

## 2018-05-22 VITALS — Ht 61.0 in | Wt 190.2 lb

## 2018-05-22 DIAGNOSIS — R7301 Impaired fasting glucose: Secondary | ICD-10-CM | POA: Diagnosis not present

## 2018-05-22 DIAGNOSIS — I10 Essential (primary) hypertension: Secondary | ICD-10-CM | POA: Diagnosis not present

## 2018-05-22 NOTE — Progress Notes (Signed)
Medical Nutrition Therapy:  Visit start time:9:15am  end time: 10:15  Assessment:  Diagnosis:hypertension Past medical history: elevated cholesterol, diverticulosis, sleep apnea, history of bariatric sleeve surgery 06/2013. Psychosocial issues/ stress concerns:  Patient rates her stress as low but indicates she tends to "eat" when stressed. Scored 0 on PHQ depression screening  Preferred learning method:  . Auditory . Visual . Hands-on Current weight:190.2 lbs Height: 51 in Medications, supplements: see list  Progress and evaluation:  Patient in for initial medical nutrition therapy appointment. Her referral indicates hypertension as diagnosis and she stated she is interestied in establishing a more consistent meal pattern with a review on foods needed to meet nutrient needs. She lost approximately 40 lbs after bariatric sleeve surgery 5 years ago and had regained most of the weight.  In February, she began making an effort to lose weight by eliminating her daily 2-3 glasses of wine and eating "on a more regular schedule". She has lost 10 lbs since then. She states she does not eat large portions but had no regular routine to meals and snacks. Her main meats are chicken and fish and she likes a variety of vegetables.   Physical activity:no structured exercise; foot and back pain limits exercise.  Dietary Intake:  Usual eating pattern includes 2-3 meals and 1-2 snacks per day. Dining out/take out frequency: Most meals are prepared at home. Breakfast: 8-9:00am- yogurt or Premier protein drink or 1-2 eggs with cheese, 2 strips bacon, coffee or oatmeal or honey nut cheerios and milk Lunch: 12-1:00pm- leftovers from dinner or low carb bread, [place], tomato, water Snack:- protein bar or yogurt Supper: 6:00 Ex. Chicken or fish , potatoes/carrots/onions or a green vegetable or salad; sometimes waffle with SF syrup Snack: yogurt or chips with onion dip Beverages:8+ cups water daily, Also drinks  seltzer water with lemon, Crystal lite flavored water; occasional diet soda  Nutrition Care Education: Basic nutrition:  Discussed benefit of establishing a consistent meal pattern. Reviewed identifying carbohydrate foods, protein and non-starchy vegetables. Use food guide plate and food models to show portions and how to better balance nutrients. Gave and reviewed sample menus which meet nutrient needs by including food group servings/portions needed.  Hypertension:  importance of controlling BP, identifying high sodium foods; Gave and reviewed list of high sodium products and lower sodium alternatives. Instructed on general guidelines for lowering sodium in the diet. Also, instructed on role of potassium in blood pressure control and food sources. Exercise: Gave and reviewed Arm Chair exercise handout which diagrams exercises that can be done in a sitting position.  Nutritional Diagnosis:  [place]-3.3 Overweight/obesity As related to inconsistent meal and snack pattern and lack of physical activity.  As evidenced by diet/exercise history and BMI of 35.9..  Intervention: Establish a meal pattern of 3 small meals and 1-2 small snacks. Balance meals with 1-3 oz protein, 1 starch serving and non-starchy vegetables.  Strive for 6-7 oz protein food per day. Refer to list. Can add a small serving of fruit with meals. Read labels for sodium content. Sodium recommendation is to limit to no more than 1500 mg per day; 500 mg per meal when possible. Frozen dinners: Look for meals with no more than 600 mg.  Try some armchair exercises. Refer to handout  Education Materials given:  . Plate Planner . Food lists/ Planning A Balanced Meal . Sample meal pattern/ menus . List of High sodium foods/products and lower sodium alternatives . Low Sodium diet recommendations . Goals/ instructions Learner/ who  was taught:  . Patient  Level of understanding: . Partial understanding; needs review/  practice Demonstrated degree of understanding via:   Teach back Learning barriers: . None Willingness to learn/ readiness for change: . Eager, change in progress  Monitoring and Evaluation:  Dietary intake, exercise, and body weight      follow up: 06/19/2018 at 9:00am

## 2018-05-22 NOTE — Patient Instructions (Signed)
Establish a meal pattern of 3 small meals and 1-2 small snacks. Balance meals with 1-3 oz protein, 1 starch serving and non-starchy vegetables.  Strive for 6-7 oz protein food per day. Refer to list. Can add a small serving of fruit with meals. Read labels for sodium content. Sodium recommendation is to limit to no more than 1500 mg per day; 500 mg per meal when possible. Frozen dinners: Look for meals with no more than 600 mg.  Try some armchair exercises. Refer to handout

## 2018-05-24 ENCOUNTER — Other Ambulatory Visit: Payer: Self-pay

## 2018-05-24 ENCOUNTER — Encounter (INDEPENDENT_AMBULATORY_CARE_PROVIDER_SITE_OTHER): Payer: Self-pay | Admitting: Vascular Surgery

## 2018-05-24 ENCOUNTER — Ambulatory Visit (INDEPENDENT_AMBULATORY_CARE_PROVIDER_SITE_OTHER): Payer: Medicare Other | Admitting: Vascular Surgery

## 2018-05-24 VITALS — BP 124/72 | HR 89 | Resp 16 | Ht 61.0 in | Wt 193.6 lb

## 2018-05-24 DIAGNOSIS — K219 Gastro-esophageal reflux disease without esophagitis: Secondary | ICD-10-CM | POA: Diagnosis not present

## 2018-05-24 DIAGNOSIS — Z87891 Personal history of nicotine dependence: Secondary | ICD-10-CM | POA: Diagnosis not present

## 2018-05-24 DIAGNOSIS — Z79899 Other long term (current) drug therapy: Secondary | ICD-10-CM

## 2018-05-24 DIAGNOSIS — I890 Lymphedema, not elsewhere classified: Secondary | ICD-10-CM | POA: Diagnosis not present

## 2018-05-24 DIAGNOSIS — E785 Hyperlipidemia, unspecified: Secondary | ICD-10-CM | POA: Diagnosis not present

## 2018-05-24 DIAGNOSIS — I10 Essential (primary) hypertension: Secondary | ICD-10-CM | POA: Diagnosis not present

## 2018-05-24 DIAGNOSIS — I872 Venous insufficiency (chronic) (peripheral): Secondary | ICD-10-CM | POA: Diagnosis not present

## 2018-05-24 NOTE — Progress Notes (Signed)
MRN : [id number]  Jennifer [name] is a 68 y.o. (05/02/1950) female who presents with chief complaint of  Chief Complaint  Patient presents with  . New Patient (Initial Visit)    ref Arnett for chronic swelling  .  History of Present Illness:   Patient is seen for evaluation of leg pain and leg swelling. The patient first noticed the swelling remotely. The swelling is associated with pain and discoloration. The pain and swelling worsens with prolonged dependency and improves with elevation. The pain is unrelated to activity.  The patient notes that in the morning the legs are significantly improved but they steadily worsened throughout the course of the day. The patient also notes a steady worsening of the discoloration in the ankle and shin area.   The patient denies claudication symptoms.  The patient denies symptoms consistent with rest pain.  The patient denies and extensive history of DJD and LS spine disease.  The patient has no had any past angiography, interventions or vascular surgery.  Elevation makes the leg symptoms better, dependency makes them much worse. There is no history of ulcerations. The patient denies any recent changes in medications.  The patient has not been wearing graduated compression.  The patient denies a history of DVT or PE. There is no prior history of phlebitis. There is no history of primary lymphedema.  No history of malignancies. No history of trauma or groin or pelvic surgery. There is no history of radiation treatment to the groin or pelvis  The patient denies amaurosis fugax or recent TIA symptoms. There are no recent neurological changes noted. The patient denies recent episodes of angina or shortness of breath  Current Meds  Medication Sig  . acyclovir (ZOVIRAX) 400 MG tablet Take 1 tablet (400 mg total) by mouth daily.  [name] Kitchen amLODipine (NORVASC) 2.5 MG tablet Take 2.5mg  IF blood pressure if above 140/70.  [name] Kitchen aspirin 81 MG tablet Take 81 mg  by mouth daily.  . cholecalciferol (VITAMIN D) 1000 UNITS tablet Take 400 Units by mouth daily.   . cyclobenzaprine (FLEXERIL) 10 MG tablet Take 10 mg by mouth 2 (two) times daily at 10 AM and 5 PM.  . fexofenadine (ALLEGRA) 180 MG tablet Take 1 tablet (180 mg total) by mouth daily. (Patient taking differently: Take 180 mg by mouth as needed. )  . fluticasone (FLONASE) 50 MCG/ACT nasal spray Place 1 spray into both nostrils daily.  . furosemide (LASIX) 20 MG tablet Take 20 mg by mouth daily.  [name] Kitchen gabapentin (NEURONTIN) 100 MG capsule TAKE 1 CAPSULE (100 MG TOTAL) BY MOUTH AT BEDTIME.  . hydrocortisone (ANUSOL-HC) 25 MG suppository Place 1 suppository (25 mg total) rectally 2 (two) times daily as needed for hemorrhoids or anal itching.  . levothyroxine (SYNTHROID, LEVOTHROID) 50 MCG tablet Take 1 tablet (50 mcg total) by mouth daily.  [name] Kitchen lisinopril (PRINIVIL,ZESTRIL) 10 MG tablet Take 1 tablet (10 mg total) by mouth daily.  . Multiple Vitamins-Minerals (MULTIVITAMIN WITH MINERALS) tablet Take 1 tablet by mouth daily.  . naphazoline-pheniramine (ALLERGY EYE) 0.025-0.3 % ophthalmic solution Place 1 drop into both eyes 4 (four) times daily as needed for eye irritation or allergies.  [name] Kitchen omeprazole (PRILOSEC) 20 MG capsule Take 1 capsule (20 mg total) by mouth daily.  . potassium chloride (K-DUR) 10 MEQ tablet Take 1 tablet (10 mEq total) by mouth daily. Only when taking furosemide ( Lasix).  . rosuvastatin (CRESTOR) 40 MG tablet Take 1 tablet (40 mg total) by mouth daily.  [name] Kitchen  sodium chloride (OCEAN) 0.65 % SOLN nasal spray Place 1 spray into both nostrils as needed for congestion.  . traZODone (DESYREL) 100 MG tablet TAKE 1 TABLET BY MOUTH EVERYDAY AT BEDTIME    Past Medical History:  Diagnosis Date  . Anxiety   . Constipation   . Elevated liver enzymes   . Hemorrhoids   . Herpes genitalis   . High cholesterol   . Hyperlipidemia   . Hypertension   . Hypothyroidism   . Osteoarthritis   . Sleep  apnea     Past Surgical History:  Procedure Laterality Date  . ABDOMINAL HYSTERECTOMY     total for fibroids no h/o abnormal pap  . bariatric sleeve  2015  . BREAST EXCISIONAL BIOPSY Left 1998  . carpal tunnel repair    . COLONOSCOPY WITH PROPOFOL N/A 02/11/2016   Procedure: COLONOSCOPY WITH PROPOFOL;  Surgeon: Jonathon Bellows, MD;  Location: [facility] ENDOSCOPY;  Service: Endoscopy;  Laterality: N/A;  . HEMORRHOID SURGERY      Social History Social History   Tobacco Use  . Smoking status: Former Smoker    Packs/day: 1.50    Years: 24.00    Pack years: 36.00    Types: Cigarettes    Last attempt to quit: 1993    Years since quitting: 27.3  . Smokeless tobacco: Never Used  . Tobacco comment: quit 1995.   Substance Use Topics  . Alcohol use: Yes    Alcohol/week: 14.0 standard drinks    Types: 14 Glasses of wine per week    Comment: occ  . Drug use: No    Family History Family History  Problem Relation Age of Onset  . Breast cancer Sister 47       materal 1/2 sister  . Hypertension Sister   . Hypertension Mother   . Heart disease Father   . Hypertension Brother   No family history of bleeding/clotting disorders, porphyria or autoimmune disease   Allergies  Allergen Reactions  . Celecoxib Hives  . Pollen Extract   . Nasacort [Triamcinolone] Other (See Comments)    Nasal - Nose Bleeds  . Vicodin [Hydrocodone-Acetaminophen] Nausea Only     REVIEW OF SYSTEMS (Negative unless checked)  Constitutional: [] Weight loss  [] Fever  [] Chills Cardiac: [] Chest pain   [] Chest pressure   [] Palpitations   [] Shortness of breath when laying flat   [] Shortness of breath with exertion. Vascular:  [] Pain in legs with walking   [x] Pain in legs at with dependency  [] History of DVT   [] Phlebitis   [x] Swelling in legs   [] Varicose veins   [] Non-healing ulcers Pulmonary:   [] Uses home oxygen   [] Productive cough   [] Hemoptysis   [] Wheeze  [] COPD   [] Asthma Neurologic:  [] Dizziness   [] Seizures    [] History of stroke   [] History of TIA  [] Aphasia   [] Vissual changes   [] Weakness or numbness in arm   [] Weakness or numbness in leg Musculoskeletal:   [] Joint swelling   [x] Joint pain   [] Low back pain Hematologic:  [] Easy bruising  [] Easy bleeding   [] Hypercoagulable state   [] Anemic Gastrointestinal:  [] Diarrhea   [] Vomiting  [] Gastroesophageal reflux/heartburn   [] Difficulty swallowing. Genitourinary:  [] Chronic kidney disease   [] Difficult urination  [] Frequent urination   [] Blood in urine Skin:  [] Rashes   [] Ulcers  Psychological:  [] History of anxiety   []  History of major depression.  Physical Examination  Vitals:   05/24/18 1307  BP: 124/72  Pulse: 89  Resp: 16  Weight: 193 lb 9.6 oz (87.8 kg)  Height: 5\' 1"  (1.549 m)   Body mass index is 36.58 kg/m. Gen: WD/WN, NAD Head: [place]/AT, No temporalis wasting.  Ear/Nose/Throat: Hearing grossly intact, nares w/o erythema or drainage, poor dentition Eyes: PER, EOMI, sclera nonicteric.  Neck: Supple, no masses.  No bruit or JVD.  Pulmonary:  Good air movement, clear to auscultation bilaterally, no use of accessory muscles.  Cardiac: RRR, normal S1, S2, no Murmurs. Vascular:  scattered varicosities present bilaterally.  Mild to moderate venous stasis changes to the legs bilaterally.  2-3+ soft pitting edema Vessel Right Left  Radial Palpable Palpable  PT Palpable Palpable  DP Palpable Palpable  Gastrointestinal: soft, non-distended. No guarding/no peritoneal signs.  Musculoskeletal: M/S 5/5 throughout.  No deformity or atrophy.  Neurologic: CN 2-12 intact. Pain and light touch intact in extremities.  Symmetrical.  Speech is fluent. Motor exam as listed above. Psychiatric: Judgment intact, Mood & affect appropriate for pt's clinical situation. Dermatologic: No rashes or ulcers noted.  No changes consistent with cellulitis. Lymph : No Cervical lymphadenopathy, no lichenification or skin changes of chronic lymphedema.  CBC Lab  Results  Component Value Date   WBC 7.7 03/07/2018   HGB 12.8 03/07/2018   HCT 39.5 03/07/2018   MCV 89.6 03/07/2018   PLT 166 03/07/2018    BMET    Component Value Date/Time   NA 141 05/07/2018 1351   NA 139 06/26/2013 0409   K 3.5 05/07/2018 1351   K 4.3 06/26/2013 0409   CL 105 05/07/2018 1351   CL 106 06/26/2013 0409   CO2 28 05/07/2018 1351   CO2 28 06/26/2013 0409   GLUCOSE 94 05/07/2018 1351   GLUCOSE 108 (H) 06/26/2013 0409   BUN 15 05/07/2018 1351   BUN 12 06/26/2013 0409   CREATININE 0.87 05/07/2018 1351   CREATININE 0.72 06/26/2013 0409   CALCIUM 9.8 05/07/2018 1351   CALCIUM 8.5 06/26/2013 0409   GFRNONAA 58 (L) 03/07/2018 1703   GFRNONAA >60 06/26/2013 0409   GFRAA >60 03/07/2018 1703   GFRAA >60 06/26/2013 0409   Estimated Creatinine Clearance: 62.3 mL/min (by C-G formula based on SCr of 0.87 mg/dL).  COAG No results found for: INR, PROTIME  Radiology No results found.   Assessment/Plan 1. Chronic venous insufficiency No surgery or intervention at this point in time.    I have had a long discussion with the patient regarding venous insufficiency and why it  causes symptoms. I have discussed with the patient the chronic skin changes that accompany venous insufficiency and the long term sequela such as infection and ulceration.  Patient will begin wearing graduated compression stockings class 1 (20-30 mmHg) or compression wraps on a daily basis a prescription was given. The patient will put the stockings on first thing in the morning and removing them in the evening. The patient is instructed specifically not to sleep in the stockings.    In addition, behavioral modification including several periods of elevation of the lower extremities during the day will be continued. I have demonstrated that proper elevation is a position with the ankles at heart level.  The patient is instructed to begin routine exercise, especially walking on a daily basis   Patient should undergo duplex ultrasound of the venous system to ensure that DVT or reflux is not present.  Following the review of the ultrasound the patient will follow up in 2-3 months to reassess the degree of swelling and the control that graduated compression  stockings or compression wraps  is offering.   The patient can be assessed for a Lymph Pump at that time  - VAS [name] LOWER EXTREMITY VENOUS REFLUX; Future  2. Lymphedema I have had a long discussion with the patient regarding swelling and why it  causes symptoms.  Patient will begin wearing graduated compression stockings class 1 (20-30 mmHg) on a daily basis a prescription was given. The patient will  beginning wearing the stockings first thing in the morning and removing them in the evening. The patient is instructed specifically not to sleep in the stockings.   In addition, behavioral modification will be initiated.  This will include frequent elevation, use of over the counter pain medications and exercise such as walking.  I have reviewed systemic causes for chronic edema such as liver, kidney and cardiac etiologies.  The patient denies problems with these organ systems.    Consideration for a lymph pump will also be made based upon the effectiveness of conservative therapy.  This would help to improve the edema control and prevent sequela such as ulcers and infections   Patient should undergo duplex ultrasound of the venous system to ensure that DVT or reflux is not present.  The patient will follow-up with me after the ultrasound.   - VAS [name] LOWER EXTREMITY VENOUS REFLUX; Future  3. Essential hypertension Continue antihypertensive medications as already ordered, these medications have been reviewed and there are no changes at this time.   4. Gastroesophageal reflux disease, esophagitis presence not specified Continue PPI as already ordered, this medication has been reviewed and there are no changes at this time.   Avoidence of caffeine and alcohol  Moderate elevation of the head of the bed   5. Hyperlipidemia, unspecified hyperlipidemia type Continue statin as ordered and reviewed, no changes at this time    Hortencia Pilar, MD  05/24/2018 1:19 PM

## 2018-05-29 DIAGNOSIS — M7741 Metatarsalgia, right foot: Secondary | ICD-10-CM | POA: Diagnosis not present

## 2018-05-29 DIAGNOSIS — M79671 Pain in right foot: Secondary | ICD-10-CM | POA: Diagnosis not present

## 2018-05-29 DIAGNOSIS — M7742 Metatarsalgia, left foot: Secondary | ICD-10-CM | POA: Diagnosis not present

## 2018-05-29 DIAGNOSIS — R748 Abnormal levels of other serum enzymes: Secondary | ICD-10-CM | POA: Diagnosis not present

## 2018-05-29 DIAGNOSIS — M79672 Pain in left foot: Secondary | ICD-10-CM | POA: Diagnosis not present

## 2018-06-08 DIAGNOSIS — M25511 Pain in right shoulder: Secondary | ICD-10-CM | POA: Diagnosis not present

## 2018-06-08 DIAGNOSIS — M25512 Pain in left shoulder: Secondary | ICD-10-CM | POA: Diagnosis not present

## 2018-06-08 DIAGNOSIS — M7582 Other shoulder lesions, left shoulder: Secondary | ICD-10-CM | POA: Insufficient documentation

## 2018-06-08 DIAGNOSIS — M47812 Spondylosis without myelopathy or radiculopathy, cervical region: Secondary | ICD-10-CM | POA: Diagnosis not present

## 2018-06-08 DIAGNOSIS — M542 Cervicalgia: Secondary | ICD-10-CM | POA: Diagnosis not present

## 2018-06-08 DIAGNOSIS — M7581 Other shoulder lesions, right shoulder: Secondary | ICD-10-CM | POA: Diagnosis not present

## 2018-06-08 DIAGNOSIS — Z9889 Other specified postprocedural states: Secondary | ICD-10-CM | POA: Diagnosis not present

## 2018-06-13 ENCOUNTER — Other Ambulatory Visit: Payer: Self-pay | Admitting: Family

## 2018-06-13 DIAGNOSIS — I10 Essential (primary) hypertension: Secondary | ICD-10-CM

## 2018-06-13 DIAGNOSIS — M79671 Pain in right foot: Secondary | ICD-10-CM

## 2018-06-19 ENCOUNTER — Ambulatory Visit: Payer: Medicare Other | Admitting: Dietician

## 2018-06-22 ENCOUNTER — Ambulatory Visit: Payer: Medicare Other | Admitting: Family

## 2018-06-22 ENCOUNTER — Other Ambulatory Visit: Payer: Self-pay | Admitting: Family

## 2018-06-22 ENCOUNTER — Ambulatory Visit (INDEPENDENT_AMBULATORY_CARE_PROVIDER_SITE_OTHER): Payer: Medicare Other | Admitting: Family Medicine

## 2018-06-22 ENCOUNTER — Other Ambulatory Visit: Payer: Self-pay

## 2018-06-22 DIAGNOSIS — J019 Acute sinusitis, unspecified: Secondary | ICD-10-CM | POA: Diagnosis not present

## 2018-06-22 DIAGNOSIS — J309 Allergic rhinitis, unspecified: Secondary | ICD-10-CM | POA: Diagnosis not present

## 2018-06-22 MED ORDER — MONTELUKAST SODIUM 10 MG PO TABS: 10.0000 mg | ORAL_TABLET | Freq: Every day | ORAL | 3 refills | Status: DC

## 2018-06-22 MED ORDER — AMOXICILLIN-POT CLAVULANATE 875-125 MG PO TABS: 1.0000 | ORAL_TABLET | Freq: Two times a day (BID) | ORAL | 0 refills | Status: DC

## 2018-06-22 NOTE — Progress Notes (Signed)
Patient ID: Jennifer [name], female   DOB: January 30, 1950, 68 y.o.   MRN: [id number]    Virtual Visit via video Note  This visit type was conducted due to national recommendations for restrictions regarding the COVID-19 pandemic (e.g. social distancing).  This format is felt to be most appropriate for this patient at this time.  All issues noted in this document were discussed and addressed.  No physical exam was performed (except for noted visual exam findings with Video Visits).   I connected with Charisa today at  1:40 PM EDT by a video enabled telemedicine application and verified that I am speaking with the correct person using two identifiers. Location patient: home Location provider: [facility] Persons participating in the virtual visit: patient, provider  I discussed the limitations, risks, security and privacy concerns of performing an evaluation and management service by video and the availability of in person appointments. I also discussed with the patient that there may be a patient responsible charge related to this service. The patient expressed understanding and agreed to proceed.   HPI:  Patient and I connected via video due to complaints of sore throat and nasal congestion over the past 4 weeks that seems to just be getting worse.  Patient has chronic seasonal allergies and a chronic postnasal drip.  Usually can manage it well with Flonase and over-the-counter antihistamine.  States this spring season has been difficult for her and nose never seems to stop running and is always dripping down back of throat.  Patient is constantly clearing throat.  Also is starting to have pressure around eyes and above teeth.  When she blows her nose it is a thick greenish-yellow mucus.  Denies fever or chills.  Denies body aches.  Denies cough, shortness of breath or wheezing.  Denies chest pain.  Denies GI or GU issues  ROS: See pertinent positives and negatives per HPI.  Past Medical  History:  Diagnosis Date  . Anxiety   . Constipation   . Elevated liver enzymes   . Hemorrhoids   . Herpes genitalis   . High cholesterol   . Hyperlipidemia   . Hypertension   . Hypothyroidism   . Osteoarthritis   . Sleep apnea     Past Surgical History:  Procedure Laterality Date  . ABDOMINAL HYSTERECTOMY     total for fibroids no h/o abnormal pap  . bariatric sleeve  2015  . BREAST EXCISIONAL BIOPSY Left 1998  . carpal tunnel repair    . COLONOSCOPY WITH PROPOFOL N/A 02/11/2016   Procedure: COLONOSCOPY WITH PROPOFOL;  Surgeon: Jonathon Bellows, MD;  Location: [facility] ENDOSCOPY;  Service: Endoscopy;  Laterality: N/A;  . HEMORRHOID SURGERY      Family History  Problem Relation Age of Onset  . Breast cancer Sister 10       materal 1/2 sister  . Hypertension Sister   . Hypertension Mother   . Heart disease Father   . Hypertension Brother    Social History   Tobacco Use  . Smoking status: Former Smoker    Packs/day: 1.50    Years: 24.00    Pack years: 36.00    Types: Cigarettes    Last attempt to quit: 1993    Years since quitting: 27.4  . Smokeless tobacco: Never Used  . Tobacco comment: quit 1995.   Substance Use Topics  . Alcohol use: Yes    Alcohol/week: 14.0 standard drinks    Types: 14 Glasses of wine per week  Comment: occ    Current Outpatient Medications:  .  acyclovir (ZOVIRAX) 400 MG tablet, Take 1 tablet (400 mg total) by mouth daily., Disp: 90 tablet, Rfl: 1 .  amLODipine (NORVASC) 2.5 MG tablet, Take 2.5mg  IF blood pressure if above 140/70., Disp: 90 tablet, Rfl: 0 .  aspirin 81 MG tablet, Take 81 mg by mouth daily., Disp: , Rfl:  .  cholecalciferol (VITAMIN D) 1000 UNITS tablet, Take 400 Units by mouth daily. , Disp: , Rfl:  .  cyclobenzaprine (FLEXERIL) 10 MG tablet, Take 10 mg by mouth 2 (two) times daily at 10 AM and 5 PM., Disp: , Rfl:  .  Diclofenac-miSOPROStol 75-0.2 MG TBEC, Take by mouth., Disp: , Rfl:  .  fexofenadine (ALLEGRA) 180 MG tablet,  Take 1 tablet (180 mg total) by mouth daily. (Patient taking differently: Take 180 mg by mouth as needed. ), Disp: 30 tablet, Rfl: 3 .  fluticasone (FLONASE) 50 MCG/ACT nasal spray, Place 1 spray into both nostrils daily., Disp: 16 g, Rfl: 2 .  furosemide (LASIX) 20 MG tablet, Take 20 mg by mouth daily., Disp: , Rfl:  .  gabapentin (NEURONTIN) 100 MG capsule, TAKE 1 CAPSULE (100 MG TOTAL) BY MOUTH AT BEDTIME., Disp: 90 capsule, Rfl: 5 .  hydrocortisone (ANUSOL-HC) 25 MG suppository, Place 1 suppository (25 mg total) rectally 2 (two) times daily as needed for hemorrhoids or anal itching., Disp: 12 suppository, Rfl: 2 .  levothyroxine (SYNTHROID, LEVOTHROID) 50 MCG tablet, Take 1 tablet (50 mcg total) by mouth daily., Disp: 90 tablet, Rfl: 1 .  lisinopril (ZESTRIL) 10 MG tablet, TAKE 1 TABLET BY MOUTH EVERY DAY, Disp: 30 tablet, Rfl: 5 .  methylPREDNISolone (MEDROL DOSEPAK) 4 MG TBPK tablet, Take 4 mg by mouth., Disp: , Rfl:  .  Multiple Vitamins-Minerals (MULTIVITAMIN WITH MINERALS) tablet, Take 1 tablet by mouth daily., Disp: , Rfl:  .  naphazoline-pheniramine (ALLERGY EYE) 0.025-0.3 % ophthalmic solution, Place 1 drop into both eyes 4 (four) times daily as needed for eye irritation or allergies., Disp: 15 mL, Rfl: 1 .  omeprazole (PRILOSEC) 20 MG capsule, TAKE 1 CAPSULE BY MOUTH EVERY DAY, Disp: 30 capsule, Rfl: 5 .  potassium chloride (K-DUR) 10 MEQ tablet, Take 1 tablet (10 mEq total) by mouth daily. Only when taking furosemide ( Lasix)., Disp: 90 tablet, Rfl: 0 .  rosuvastatin (CRESTOR) 40 MG tablet, Take 1 tablet (40 mg total) by mouth daily., Disp: 90 tablet, Rfl: 2 .  sodium chloride (OCEAN) 0.65 % SOLN nasal spray, Place 1 spray into both nostrils as needed for congestion., Disp: , Rfl:  .  traZODone (DESYREL) 100 MG tablet, TAKE 1 TABLET BY MOUTH EVERYDAY AT BEDTIME, Disp: 90 tablet, Rfl: 14  EXAM:  GENERAL: alert, oriented, appears well and in no acute distress  HEENT: atraumatic,  conjunttiva clear, no obvious abnormalities on inspection of external nose and ears  NECK: normal movements of the head and neck  LUNGS: on inspection no signs of respiratory distress, breathing rate appears normal, no obvious gross SOB, gasping or wheezing  CV: no obvious cyanosis  MS: moves all visible extremities without noticeable abnormality  PSYCH/NEURO: pleasant and cooperative, no obvious depression or anxiety, speech and thought processing grossly intact  ASSESSMENT AND PLAN:  Discussed the following assessment and plan:  Acute sinusitis, recurrence not specified, unspecified location - Plan: amoxicillin-clavulanate (AUGMENTIN) 875-125 MG tablet, montelukast (SINGULAIR) 10 MG tablet  Chronic allergic rhinitis - Plan: montelukast (SINGULAIR) 10 MG tablet  Suspect patient is  having an acute sinusitis.  We will treat with Augmentin twice daily for 10 days.  She will also continue over-the-counter antihistamine and Flonase daily to help manage chronic allergic rhinitis and we will add Singulair in the evening for better allergy control.  Also recommended doing a saline nasal rinse at least once a day to help wash out sinuses and help to better manage allergy symptoms.   I discussed the assessment and treatment plan with the patient. The patient was provided an opportunity to ask questions and all were answered. The patient agreed with the plan and demonstrated an understanding of the instructions.   The patient was advised to call back or seek an in-person evaluation if the symptoms worsen or if the condition fails to improve as anticipated.  Discussed possibility of seeing an ENT or allergist if sinus infection recurs  Jodelle Green, FNP

## 2018-06-25 ENCOUNTER — Encounter: Payer: Self-pay | Admitting: Family Medicine

## 2018-06-29 ENCOUNTER — Telehealth: Payer: Self-pay | Admitting: Family

## 2018-06-29 NOTE — Telephone Encounter (Signed)
Call pt  [place]  Back as know she saw my colleague Ander Purpura who is great  She started her on singulair which is a great medication for allergies in particular.   I just wanted her to be watchful/mindful as singulair has been associated with worsening depression and  FDA given it a 'black box warning' worsening in regards to strange or suidicidal thoughts. Any unusual thoughts, she needs to let us know.  Just wanted her to aware though I agree with Lauren's assessment.  Let us know if she needs anything.

## 2018-07-02 ENCOUNTER — Encounter: Payer: Self-pay | Admitting: Dietician

## 2018-07-02 ENCOUNTER — Other Ambulatory Visit: Payer: Self-pay

## 2018-07-02 ENCOUNTER — Encounter: Payer: Self-pay | Admitting: Family

## 2018-07-02 ENCOUNTER — Ambulatory Visit (INDEPENDENT_AMBULATORY_CARE_PROVIDER_SITE_OTHER): Payer: Medicare Other | Admitting: Family

## 2018-07-02 DIAGNOSIS — J309 Allergic rhinitis, unspecified: Secondary | ICD-10-CM

## 2018-07-02 MED ORDER — DOXYCYCLINE HYCLATE 100 MG PO TABS: 100.0000 mg | ORAL_TABLET | Freq: Two times a day (BID) | ORAL | 0 refills | Status: DC

## 2018-07-02 NOTE — Telephone Encounter (Signed)
LMTCB to check how patient was doing.

## 2018-07-02 NOTE — Assessment & Plan Note (Signed)
Afebrile. Chronic. Suspect allergic rhinitis. Stop augmentin. Start doxycycline.  Referral to ENT due to chronicity. She will let me know how she is doing. Will follow.

## 2018-07-02 NOTE — Progress Notes (Signed)
This visit type was conducted due to national recommendations for restrictions regarding the COVID-19 pandemic (e.g. social distancing).  This format is felt to be most appropriate for this patient at this time.  All issues noted in this document were discussed and addressed.  No physical exam was performed (except for noted visual exam findings with Video Visits). Virtual Visit via Video Note  I connected with@  on 07/02/18 at  2:30 PM EDT by a video enabled telemedicine application and verified that I am speaking with the correct person using two identifiers.  Location patient: home Location provider:work Persons participating in the virtual visit: patient, provider  I discussed the limitations of evaluation and management by telemedicine and the availability of in person appointments. The patient expressed understanding and agreed to proceed.  Interactive audio and video telecommunications were attempted between this provider and patient, however failed, due to patient having technical difficulties or patient did not have access to video capability.  We continued and completed visit with audio only.   HPI:  CC: congestion x 2 months, waxing and waning Occurs in springtime from 'pollen.'   Endorses top of HA, nasal congestion, sore throat.  HA is 'not often'. Not worse HA of life. No ha today.  Describes post nasal drip which causes cough, eyes are watery.  No ear pain, fever, cp, sob, wheezing, changes in vision, numbness in face.    only taking the allegra plus augmentin ( currently on).  symptoms had not improved at all. Had been on flonase. No prior antibiotic.    Hard to wear CPAP machine due to the drainage in back of her throat.  Washes Cipap house once per week; washes nose mask nightly.  Has used cipap cleaner. Has moist, warm air on cipap.   Drinking lots of water.   Allergy testing 20 years ago. Unremarkable per patient.   ROS: See pertinent positives and negatives per  HPI.  Past Medical History:  Diagnosis Date  . Anxiety   . Constipation   . Elevated liver enzymes   . Hemorrhoids   . Herpes genitalis   . High cholesterol   . Hyperlipidemia   . Hypertension   . Hypothyroidism   . Osteoarthritis   . Sleep apnea     Past Surgical History:  Procedure Laterality Date  . ABDOMINAL HYSTERECTOMY     total for fibroids no h/o abnormal pap  . bariatric sleeve  2015  . BREAST EXCISIONAL BIOPSY Left 1998  . carpal tunnel repair    . COLONOSCOPY WITH PROPOFOL N/A 02/11/2016   Procedure: COLONOSCOPY WITH PROPOFOL;  Surgeon: Jonathon Bellows, MD;  Location: [facility] ENDOSCOPY;  Service: Endoscopy;  Laterality: N/A;  . HEMORRHOID SURGERY      Family History  Problem Relation Age of Onset  . Breast cancer Sister 50       materal 1/2 sister  . Hypertension Sister   . Hypertension Mother   . Heart disease Father   . Hypertension Brother     SOCIAL HX: former smoker   Current Outpatient Medications:  .  acyclovir (ZOVIRAX) 400 MG tablet, Take 1 tablet (400 mg total) by mouth daily., Disp: 90 tablet, Rfl: 1 .  amLODipine (NORVASC) 2.5 MG tablet, Take 2.5mg  IF blood pressure if above 140/70., Disp: 90 tablet, Rfl: 0 .  aspirin 81 MG tablet, Take 81 mg by mouth daily., Disp: , Rfl:  .  cholecalciferol (VITAMIN D) 1000 UNITS tablet, Take 400 Units by mouth daily. , Disp: ,  Rfl:  .  cyclobenzaprine (FLEXERIL) 10 MG tablet, Take 10 mg by mouth 2 (two) times daily at 10 AM and 5 PM., Disp: , Rfl:  .  fexofenadine (ALLEGRA) 180 MG tablet, Take 1 tablet (180 mg total) by mouth daily. (Patient taking differently: Take 180 mg by mouth as needed. ), Disp: 30 tablet, Rfl: 3 .  fluticasone (FLONASE) 50 MCG/ACT nasal spray, Place 1 spray into both nostrils daily., Disp: 16 g, Rfl: 2 .  gabapentin (NEURONTIN) 100 MG capsule, TAKE 1 CAPSULE (100 MG TOTAL) BY MOUTH AT BEDTIME., Disp: 90 capsule, Rfl: 5 .  levothyroxine (SYNTHROID, LEVOTHROID) 50 MCG tablet, Take 1 tablet (50  mcg total) by mouth daily., Disp: 90 tablet, Rfl: 1 .  lisinopril (ZESTRIL) 10 MG tablet, TAKE 1 TABLET BY MOUTH EVERY DAY, Disp: 30 tablet, Rfl: 5 .  Multiple Vitamins-Minerals (MULTIVITAMIN WITH MINERALS) tablet, Take 1 tablet by mouth daily., Disp: , Rfl:  .  naphazoline-pheniramine (ALLERGY EYE) 0.025-0.3 % ophthalmic solution, Place 1 drop into both eyes 4 (four) times daily as needed for eye irritation or allergies., Disp: 15 mL, Rfl: 1 .  omeprazole (PRILOSEC) 20 MG capsule, TAKE 1 CAPSULE BY MOUTH EVERY DAY, Disp: 30 capsule, Rfl: 5 .  rosuvastatin (CRESTOR) 40 MG tablet, Take 1 tablet (40 mg total) by mouth daily., Disp: 90 tablet, Rfl: 2 .  traZODone (DESYREL) 100 MG tablet, TAKE 1 TABLET BY MOUTH EVERYDAY AT BEDTIME, Disp: 90 tablet, Rfl: 14 .  doxycycline (VIBRA-TABS) 100 MG tablet, Take 1 tablet (100 mg total) by mouth 2 (two) times daily., Disp: 10 tablet, Rfl: 0 .  hydrocortisone (ANUSOL-HC) 25 MG suppository, Place 1 suppository (25 mg total) rectally 2 (two) times daily as needed for hemorrhoids or anal itching. (Patient not taking: Reported on 07/02/2018), Disp: 12 suppository, Rfl: 2  EXAM:  VITALS per patient if applicable:  GENERAL: alert, oriented, appears well and in no acute distress  HEENT: atraumatic, conjunttiva clear, no obvious abnormalities on inspection of external nose and ears  NECK: normal movements of the head and neck  LUNGS: on inspection no signs of respiratory distress, breathing rate appears normal, no obvious gross SOB, gasping or wheezing  CV: no obvious cyanosis  MS: moves all visible extremities without noticeable abnormality  PSYCH/NEURO: pleasant and cooperative, no obvious depression or anxiety, speech and thought processing grossly intact  ASSESSMENT AND PLAN:  Discussed the following assessment and plan:   Problem List Items Addressed This Visit      Respiratory   Allergic rhinitis - Primary    Afebrile. Chronic. Suspect allergic  rhinitis. Stop augmentin. Start doxycycline.  Referral to ENT due to chronicity. She will let me know how she is doing. Will follow.       Relevant Medications   doxycycline (VIBRA-TABS) 100 MG tablet   Other Relevant Orders   Ambulatory referral to ENT        I discussed the assessment and treatment plan with the patient. The patient was provided an opportunity to ask questions and all were answered. The patient agreed with the plan and demonstrated an understanding of the instructions.   The patient was advised to call back or seek an in-person evaluation if the symptoms worsen or if the condition fails to improve as anticipated.   [name], FNP   I spent 21 min face to face w/ pt.

## 2018-07-02 NOTE — Telephone Encounter (Signed)
I spoke with patient & she stated that she really was only taking the allegra plus amoxicillin. She still did not feel symptoms had improved at all. She is getting choked some with CPAP machine due to the drainage in back of her throat. I have made patient a doxy for today to follow up with Joycelyn Schmid.

## 2018-07-02 NOTE — Patient Instructions (Addendum)
Stop augmentin  Start doxycycline. Stay out of the sun as can get sunburn.   Ensure to take probiotics while on antibiotics and also for 2 weeks after completion. It is important to re-colonize the gut with good bacteria and also to prevent any diarrheal infections associated with antibiotic use.   Today we discussed referrals, orders. ENT    I have placed these orders in the system for you.  Please be sure to give [name] a call if you have not heard from our office regarding this. We should hear from [name] within ONE week with information regarding your appointment. If not, please let me know immediately.   Let me know how you are doing.

## 2018-07-12 DIAGNOSIS — K219 Gastro-esophageal reflux disease without esophagitis: Secondary | ICD-10-CM | POA: Diagnosis not present

## 2018-07-12 DIAGNOSIS — J301 Allergic rhinitis due to pollen: Secondary | ICD-10-CM | POA: Diagnosis not present

## 2018-07-12 DIAGNOSIS — F458 Other somatoform disorders: Secondary | ICD-10-CM | POA: Diagnosis not present

## 2018-07-13 ENCOUNTER — Encounter: Payer: Self-pay | Admitting: Family

## 2018-07-13 ENCOUNTER — Other Ambulatory Visit: Payer: Self-pay | Admitting: Family

## 2018-07-13 ENCOUNTER — Telehealth: Payer: Self-pay | Admitting: *Deleted

## 2018-07-13 DIAGNOSIS — I10 Essential (primary) hypertension: Secondary | ICD-10-CM

## 2018-07-13 MED ORDER — LOSARTAN POTASSIUM 25 MG PO TABS: 25.0000 mg | ORAL_TABLET | Freq: Every day | ORAL | 1 refills | Status: DC

## 2018-07-13 NOTE — Telephone Encounter (Signed)
I spoke with patient & advised to d/c lisinopril & start losartan. She will also monitor BP. She is scheduled for BMP 7/6.

## 2018-07-13 NOTE — Telephone Encounter (Signed)
Copied from [place] [phone]. Topic: General - Other >> Jul 13, 2018 11:35 AM Lennox Solders wrote: Reason for CRM: sandra with dr vaught  ENT calling to see if the office received the fax concerning take pt off lisinopril and switched to losartan. Please call sandra

## 2018-07-13 NOTE — Telephone Encounter (Signed)
BP Readings from Last 3 Encounters:  05/24/18 124/72  03/21/18 124/70  03/21/18 130/60   Call pt  She may stop lisinopril per Dr Pryor Ochoa who thinks contributory to cough She may start losartan 25mg  QD. I sent in in.   She will need to monitor blood pressure and have BMP lab done in one week.please schedule lab.   Of note, I also sent her a mychart message

## 2018-07-19 ENCOUNTER — Other Ambulatory Visit: Payer: Self-pay | Admitting: Family

## 2018-07-23 ENCOUNTER — Other Ambulatory Visit: Payer: Self-pay

## 2018-07-23 ENCOUNTER — Other Ambulatory Visit: Payer: Self-pay | Admitting: Family

## 2018-07-23 ENCOUNTER — Other Ambulatory Visit (INDEPENDENT_AMBULATORY_CARE_PROVIDER_SITE_OTHER): Payer: Medicare Other

## 2018-07-23 DIAGNOSIS — I10 Essential (primary) hypertension: Secondary | ICD-10-CM

## 2018-07-23 LAB — BASIC METABOLIC PANEL
BUN: 16 mg/dL (ref 6–23)
CO2: 29 mEq/L (ref 19–32)
Calcium: 9.2 mg/dL (ref 8.4–10.5)
Chloride: 105 mEq/L (ref 96–112)
Creatinine, Ser: 0.8 mg/dL (ref 0.40–1.20)
GFR: 86.07 mL/min (ref >60.00)
Glucose, Bld: 104 mg/dL — ABNORMAL HIGH (ref 70–99)
Potassium: 4.4 mEq/L (ref 3.5–5.1)
Sodium: 140 mEq/L (ref 135–145)

## 2018-07-31 DIAGNOSIS — R748 Abnormal levels of other serum enzymes: Secondary | ICD-10-CM | POA: Diagnosis not present

## 2018-07-31 DIAGNOSIS — K5901 Slow transit constipation: Secondary | ICD-10-CM | POA: Diagnosis not present

## 2018-07-31 DIAGNOSIS — Z9884 Bariatric surgery status: Secondary | ICD-10-CM | POA: Diagnosis not present

## 2018-07-31 DIAGNOSIS — Z6836 Body mass index (BMI) 36.0-36.9, adult: Secondary | ICD-10-CM | POA: Diagnosis not present

## 2018-07-31 DIAGNOSIS — K219 Gastro-esophageal reflux disease without esophagitis: Secondary | ICD-10-CM | POA: Diagnosis not present

## 2018-07-31 DIAGNOSIS — K760 Fatty (change of) liver, not elsewhere classified: Secondary | ICD-10-CM | POA: Diagnosis not present

## 2018-08-07 DIAGNOSIS — S70311A Abrasion, right thigh, initial encounter: Secondary | ICD-10-CM | POA: Diagnosis not present

## 2018-08-07 DIAGNOSIS — S299XXA Unspecified injury of thorax, initial encounter: Secondary | ICD-10-CM | POA: Diagnosis not present

## 2018-08-07 DIAGNOSIS — W1789XA Other fall from one level to another, initial encounter: Secondary | ICD-10-CM | POA: Diagnosis not present

## 2018-08-07 DIAGNOSIS — K219 Gastro-esophageal reflux disease without esophagitis: Secondary | ICD-10-CM | POA: Diagnosis not present

## 2018-08-07 DIAGNOSIS — S8991XA Unspecified injury of right lower leg, initial encounter: Secondary | ICD-10-CM | POA: Diagnosis not present

## 2018-08-07 DIAGNOSIS — D1721 Benign lipomatous neoplasm of skin and subcutaneous tissue of right arm: Secondary | ICD-10-CM | POA: Diagnosis not present

## 2018-08-07 DIAGNOSIS — Z23 Encounter for immunization: Secondary | ICD-10-CM | POA: Diagnosis not present

## 2018-08-07 DIAGNOSIS — R1314 Dysphagia, pharyngoesophageal phase: Secondary | ICD-10-CM | POA: Diagnosis not present

## 2018-08-07 DIAGNOSIS — M79661 Pain in right lower leg: Secondary | ICD-10-CM | POA: Diagnosis not present

## 2018-08-07 DIAGNOSIS — R0781 Pleurodynia: Secondary | ICD-10-CM | POA: Diagnosis not present

## 2018-08-09 ENCOUNTER — Other Ambulatory Visit: Payer: Self-pay | Admitting: Otolaryngology

## 2018-08-09 DIAGNOSIS — R1312 Dysphagia, oropharyngeal phase: Secondary | ICD-10-CM

## 2018-08-20 DIAGNOSIS — S70311D Abrasion, right thigh, subsequent encounter: Secondary | ICD-10-CM | POA: Diagnosis not present

## 2018-08-27 ENCOUNTER — Ambulatory Visit (INDEPENDENT_AMBULATORY_CARE_PROVIDER_SITE_OTHER): Payer: Medicare Other

## 2018-08-27 ENCOUNTER — Other Ambulatory Visit: Payer: Self-pay

## 2018-08-27 ENCOUNTER — Ambulatory Visit (INDEPENDENT_AMBULATORY_CARE_PROVIDER_SITE_OTHER): Payer: Medicare Other | Admitting: Vascular Surgery

## 2018-08-27 ENCOUNTER — Encounter (INDEPENDENT_AMBULATORY_CARE_PROVIDER_SITE_OTHER): Payer: Self-pay | Admitting: Vascular Surgery

## 2018-08-27 VITALS — BP 131/72 | HR 73 | Resp 10 | Ht 61.0 in | Wt 198.0 lb

## 2018-08-27 DIAGNOSIS — I872 Venous insufficiency (chronic) (peripheral): Secondary | ICD-10-CM

## 2018-08-27 DIAGNOSIS — I10 Essential (primary) hypertension: Secondary | ICD-10-CM

## 2018-08-27 DIAGNOSIS — K219 Gastro-esophageal reflux disease without esophagitis: Secondary | ICD-10-CM

## 2018-08-27 DIAGNOSIS — I890 Lymphedema, not elsewhere classified: Secondary | ICD-10-CM

## 2018-08-27 DIAGNOSIS — E782 Mixed hyperlipidemia: Secondary | ICD-10-CM

## 2018-08-27 NOTE — Progress Notes (Signed)
MRN : [id number]  Jennifer [name] is a 68 y.o. (23-Jan-1950) female who presents with chief complaint of No chief complaint on file. [name] Kitchen  History of Present Illness:   The patient returns to the office for followup evaluation regarding leg swelling.  The swelling has persisted and the pain associated with swelling continues. There have not been any interval development of a ulcerations or wounds.  Since the previous visit the patient has been wearing graduated compression stockings and has noted little if any improvement in the lymphedema. The patient has been using compression routinely morning until night.  The patient also states elevation during the day and exercise is being done too.  Venous duplex obtained today demonstrates patent deep venous system.  No evidence of superficial venous reflux.  All veins were compressible.   No outpatient medications have been marked as taking for the 08/27/18 encounter (Appointment) with Delana Meyer, Dolores Lory, MD.    Past Medical History:  Diagnosis Date  . Anxiety   . Constipation   . Elevated liver enzymes   . Hemorrhoids   . Herpes genitalis   . High cholesterol   . Hyperlipidemia   . Hypertension   . Hypothyroidism   . Osteoarthritis   . Sleep apnea     Past Surgical History:  Procedure Laterality Date  . ABDOMINAL HYSTERECTOMY     total for fibroids no h/o abnormal pap  . bariatric sleeve  2015  . BREAST EXCISIONAL BIOPSY Left 1998  . carpal tunnel repair    . COLONOSCOPY WITH PROPOFOL N/A 02/11/2016   Procedure: COLONOSCOPY WITH PROPOFOL;  Surgeon: Jonathon Bellows, MD;  Location[facility] ENDOSCOPY;  Service: Endoscopy;  Laterality: N/A;  . HEMORRHOID SURGERY      Social History Social History   Tobacco Use  . Smoking status: Former Smoker    Packs/day: 1.50    Years: 24.00    Pack years: 36.00    Types: Cigarettes    Quit date: 1993    Years since quitting: 27.6  . Smokeless tobacco: Never Used  . Tobacco comment: quit  1995.   Substance Use Topics  . Alcohol use: Yes    Alcohol/week: 14.0 standard drinks    Types: 14 Glasses of wine per week    Comment: occ  . Drug use: No    Family History Family History  Problem Relation Age of Onset  . Breast cancer Sister 56       materal 1/2 sister  . Hypertension Sister   . Hypertension Mother   . Heart disease Father   . Hypertension Brother     Allergies  Allergen Reactions  . Celecoxib Hives  . Pollen Extract   . Nasacort [Triamcinolone] Other (See Comments)    Nasal - Nose Bleeds  . Vicodin [Hydrocodone-Acetaminophen] Nausea Only     REVIEW OF SYSTEMS (Negative unless checked)  Constitutional: [] Weight loss  [] Fever  [] Chills Cardiac: [] Chest pain   [] Chest pressure   [] Palpitations   [] Shortness of breath when laying flat   [] Shortness of breath with exertion. Vascular:  [] Pain in legs with walking   [x] Pain in legs at rest  [] History of DVT   [] Phlebitis   [x] Swelling in legs   [] Varicose veins   [] Non-healing ulcers Pulmonary:   [] Uses home oxygen   [] Productive cough   [] Hemoptysis   [] Wheeze  [] COPD   [] Asthma Neurologic:  [] Dizziness   [] Seizures   [] History of stroke   [] History of TIA  [] Aphasia   []   Vissual changes   [] Weakness or numbness in arm   [] Weakness or numbness in leg Musculoskeletal:   [] Joint swelling   [] Joint pain   [] Low back pain Hematologic:  [] Easy bruising  [] Easy bleeding   [] Hypercoagulable state   [] Anemic Gastrointestinal:  [] Diarrhea   [] Vomiting  [x] Gastroesophageal reflux/heartburn   [] Difficulty swallowing. Genitourinary:  [] Chronic kidney disease   [] Difficult urination  [] Frequent urination   [] Blood in urine Skin:  [] Rashes   [] Ulcers  Psychological:  [] History of anxiety   []  History of major depression.  Physical Examination  There were no vitals filed for this visit. There is no height or weight on file to calculate BMI. Gen: WD/WN, NAD Head: [address]/AT, No temporalis wasting.  Ear/Nose/Throat: Hearing  grossly intact, nares w/o erythema or drainage Eyes: PER, EOMI, sclera nonicteric.  Neck: Supple, no large masses.   Pulmonary:  Good air movement, no audible wheezing bilaterally, no use of accessory muscles.  Cardiac: RRR, no JVD Vascular: scattered varicosities present bilaterally.  Mild venous stasis changes to the legs bilaterally.  3+ soft pitting edema Vessel Right Left  PT Palpable Palpable  DP Palpable Palpable  Gastrointestinal: Non-distended. No guarding/no peritoneal signs.  Musculoskeletal: M/S 5/5 throughout.  No deformity or atrophy.  Neurologic: CN 2-12 intact. Symmetrical.  Speech is fluent. Motor exam as listed above. Psychiatric: Judgment intact, Mood & affect appropriate for pt's clinical situation. Dermatologic: mild venous rashes no ulcers noted.  No changes consistent with cellulitis. Lymph : No lichenification or skin changes of chronic lymphedema.  CBC Lab Results  Component Value Date   WBC 7.7 03/07/2018   HGB 12.8 03/07/2018   HCT 39.5 03/07/2018   MCV 89.6 03/07/2018   PLT 166 03/07/2018    BMET    Component Value Date/Time   NA 140 07/23/2018 1044   NA 139 06/26/2013 0409   K 4.4 07/23/2018 1044   K 4.3 06/26/2013 0409   CL 105 07/23/2018 1044   CL 106 06/26/2013 0409   CO2 29 07/23/2018 1044   CO2 28 06/26/2013 0409   GLUCOSE 104 (H) 07/23/2018 1044   GLUCOSE 108 (H) 06/26/2013 0409   BUN 16 07/23/2018 1044   BUN 12 06/26/2013 0409   CREATININE 0.80 07/23/2018 1044   CREATININE 0.72 06/26/2013 0409   CALCIUM 9.2 07/23/2018 1044   CALCIUM 8.5 06/26/2013 0409   GFRNONAA 58 (L) 03/07/2018 1703   GFRNONAA >60 06/26/2013 0409   GFRAA >60 03/07/2018 1703   GFRAA >60 06/26/2013 0409   CrCl cannot be calculated (Patient's most recent lab result is older than the maximum 21 days allowed.).  COAG No results found for: INR, PROTIME  Radiology No results found.   Assessment/Plan 1. Lymphedema Recommend:  No surgery or intervention at  this point in time.    I have reviewed my previous discussion with the patient regarding swelling and why it causes symptoms.  Patient will continue wearing graduated compression stockings class 1 (20-30 mmHg) on a daily basis. The patient will  beginning wearing the stockings first thing in the morning and removing them in the evening. The patient is instructed specifically not to sleep in the stockings.    In addition, behavioral modification including several periods of elevation of the lower extremities during the day will be continued.  This was reviewed with the patient during the initial visit.  The patient will also continue routine exercise, especially walking on a daily basis as was discussed during the initial visit.  Despite conservative treatments including graduated compression therapy class 1 and behavioral modification including exercise and elevation the patient  has not obtained adequate control of the lymphedema.  The patient still has stage 3 lymphedema and therefore, I believe that a lymph pump should be added to improve the control of the patient's lymphedema.  Additionally, a lymph pump is warranted because it will reduce the risk of cellulitis and ulceration in the future.  Patient should follow-up in six months    2. Chronic venous insufficiency Recommend:  No surgery or intervention at this point in time.    I have reviewed my previous discussion with the patient regarding swelling and why it causes symptoms.  Patient will continue wearing graduated compression stockings class 1 (20-30 mmHg) on a daily basis. The patient will  beginning wearing the stockings first thing in the morning and removing them in the evening. The patient is instructed specifically not to sleep in the stockings.    In addition, behavioral modification including several periods of elevation of the lower extremities during the day will be continued.  This was reviewed with the patient during the  initial visit.  The patient will also continue routine exercise, especially walking on a daily basis as was discussed during the initial visit.    Despite conservative treatments including graduated compression therapy class 1 and behavioral modification including exercise and elevation the patient  has not obtained adequate control of the lymphedema.  The patient still has stage 3 lymphedema and therefore, I believe that a lymph pump should be added to improve the control of the patient's lymphedema.  Additionally, a lymph pump is warranted because it will reduce the risk of cellulitis and ulceration in the future.  Patient should follow-up in six months    3. Essential hypertension Continue antihypertensive medications as already ordered, these medications have been reviewed and there are no changes at this time.   4. Gastroesophageal reflux disease, esophagitis presence not specified Continue PPI as already ordered, this medication has been reviewed and there are no changes at this time.  Avoidence of caffeine and alcohol  Moderate elevation of the head of the bed   5. Mixed hyperlipidemia Continue statin as ordered and reviewed, no changes at this time    Hortencia Pilar, MD  08/27/2018 10:20 AM

## 2018-09-05 ENCOUNTER — Encounter: Payer: Self-pay | Admitting: Family

## 2018-09-05 ENCOUNTER — Ambulatory Visit: Payer: Medicare Other | Admitting: Family

## 2018-09-05 ENCOUNTER — Other Ambulatory Visit: Payer: Self-pay

## 2018-09-05 ENCOUNTER — Ambulatory Visit (INDEPENDENT_AMBULATORY_CARE_PROVIDER_SITE_OTHER): Payer: Medicare Other | Admitting: Family

## 2018-09-05 ENCOUNTER — Ambulatory Visit (INDEPENDENT_AMBULATORY_CARE_PROVIDER_SITE_OTHER): Payer: Medicare Other

## 2018-09-05 VITALS — BP 141/79 | HR 80 | Ht 61.0 in | Wt 197.0 lb

## 2018-09-05 DIAGNOSIS — M79671 Pain in right foot: Secondary | ICD-10-CM | POA: Diagnosis not present

## 2018-09-05 DIAGNOSIS — I10 Essential (primary) hypertension: Secondary | ICD-10-CM

## 2018-09-05 DIAGNOSIS — Z0000 Encounter for general adult medical examination without abnormal findings: Secondary | ICD-10-CM | POA: Diagnosis not present

## 2018-09-05 DIAGNOSIS — G629 Polyneuropathy, unspecified: Secondary | ICD-10-CM | POA: Diagnosis not present

## 2018-09-05 MED ORDER — GABAPENTIN 100 MG PO CAPS: ORAL_CAPSULE | ORAL | 5 refills | Status: DC

## 2018-09-05 NOTE — Assessment & Plan Note (Addendum)
Reviewed blood pressure readings which were taken BEFORE blood pressure medications were taken and felt that we should not pursue more aggressive medication as some her DBP and HR are on the low side. Her BP goal is low 120/< 80. However overall pleased as most readings below 130/80.  She will continue to monitor.

## 2018-09-05 NOTE — Patient Instructions (Addendum)
  Jennifer [name] , Thank you for taking time to come for your Medicare Wellness Visit. I appreciate your ongoing commitment to your health goals. Please review the following plan we discussed and let me know if I can assist you in the future.   These are the goals we discussed: Goals      Patient Stated   . DIET - REDUCE SUGAR INTAKE (pt-stated)     Low cholesterol diet Monitor blood pressure       This is a list of the screening recommended for you and due dates:  Health Maintenance  Topic Date Due  . Flu Shot  08/18/2018  . Colon Cancer Screening  02/11/2019  . Mammogram  02/24/2020  . Tetanus Vaccine  08/06/2028  . DEXA scan (bone density measurement)  Completed  .  Hepatitis C: One time screening is recommended by Center for Disease Control  (CDC) for  adults born from 1 through 1966.   Completed  . Pneumonia vaccines  Completed

## 2018-09-05 NOTE — Progress Notes (Signed)
Subjective:   Jennifer [name] is a 68 y.o. female who presents for Medicare Annual (Subsequent) preventive examination.  Review of Systems:  No ROS.  Medicare Wellness Virtual Visit.  Visual/audio telehealth visit, UTA vital signs.   See social history for additional risk factors.   Cardiac Risk Factors include: advanced age (>70men, >47 women)     Objective:     Vitals: There were no vitals taken for this visit.  There is no height or weight on file to calculate BMI.  Advanced Directives 09/05/2018 05/22/2018 03/07/2018 08/31/2017 09/03/2014  Does Patient Have a Medical Advance Directive? Yes Yes Yes Yes No  Type of Advance Directive Living will Doctor Phillips;Living will Living will Living will;Healthcare Power of Attorney -  Does patient want to make changes to medical advance directive? No - Patient declined - - No - Patient declined -  Copy of [place] in Chart? - - - No - copy requested -  Would patient like information on creating a medical advance directive? - - - - Yes - Scientist, clinical (histocompatibility and immunogenetics) given    Tobacco Social History   Tobacco Use  Smoking Status Former Smoker  . Packs/day: 1.50  . Years: 24.00  . Pack years: 36.00  . Types: Cigarettes  . Quit date: 55  . Years since quitting: 27.6  Smokeless Tobacco Never Used  Tobacco Comment   quit 1995.      Counseling given: Not Answered Comment: quit 1995.    Clinical Intake:  Pre-visit preparation completed: Yes        Diabetes: No  How often do you need to have someone help you when you read instructions, pamphlets, or other written materials from your doctor or pharmacy?: 1 - Never  Interpreter Needed?: No     Past Medical History:  Diagnosis Date  . Anxiety   . Constipation   . Elevated liver enzymes   . Hemorrhoids   . Herpes genitalis   . High cholesterol   . Hyperlipidemia   . Hypertension   . Hypothyroidism   . Osteoarthritis   . Sleep apnea     Past Surgical History:  Procedure Laterality Date  . ABDOMINAL HYSTERECTOMY     total for fibroids no h/o abnormal pap  . bariatric sleeve  2015  . BREAST EXCISIONAL BIOPSY Left 1998  . carpal tunnel repair    . COLONOSCOPY WITH PROPOFOL N/A 02/11/2016   Procedure: COLONOSCOPY WITH PROPOFOL;  Surgeon: Jonathon Bellows, MD;  Location: [facility] ENDOSCOPY;  Service: Endoscopy;  Laterality: N/A;  . HEMORRHOID SURGERY     Family History  Problem Relation Age of Onset  . Breast cancer Sister 65       materal 1/2 sister  . Hypertension Sister   . Hypertension Mother   . Heart disease Father   . Hypertension Brother    Social History   Socioeconomic History  . Marital status: Married    Spouse name: Not on file  . Number of children: Not on file  . Years of education: Not on file  . Highest education level: Not on file  Occupational History  . Not on file  Social Needs  . Financial resource strain: Not hard at all  . Food insecurity    Worry: Never true    Inability: Never true  . Transportation needs    Medical: No    Non-medical: No  Tobacco Use  . Smoking status: Former Smoker    Packs/day: 1.50  Years: 24.00    Pack years: 36.00    Types: Cigarettes    Quit date: 61    Years since quitting: 27.6  . Smokeless tobacco: Never Used  . Tobacco comment: quit 1995.   Substance and Sexual Activity  . Alcohol use: Yes    Alcohol/week: 14.0 standard drinks    Types: 14 Glasses of wine per week    Comment: occ  . Drug use: No  . Sexual activity: Not Currently    Birth control/protection: Surgical    Comment: Hysterectomy  Lifestyle  . Physical activity    Days per week: 0 days    Minutes per session: Not on file  . Stress: Not at all  Relationships  . Social Herbalist on phone: Not on file    Gets together: Not on file    Attends religious service: Not on file    Active member of club or organization: Not on file    Attends meetings of clubs or organizations:  Not on file    Relationship status: Not on file  Other Topics Concern  . Not on file  Social History Narrative   Lives in [place].    Married.    Retired 2015, Interior and spatial designer.    One son; granddaughter.     Outpatient Encounter Medications as of 09/05/2018  Medication Sig  . acyclovir (ZOVIRAX) 400 MG tablet Take 1 tablet (400 mg total) by mouth daily.  [name] Kitchen amLODipine (NORVASC) 2.5 MG tablet Take 2.5mg  IF blood pressure if above 140/70.  [name] Kitchen aspirin 81 MG tablet Take 81 mg by mouth daily.  [name] Kitchen azelastine (ASTELIN) 0.1 % nasal spray USE 1 SPRAY EACH NOSTRIL TWICE A DAY AS NEEDED FOR ALLERGIES  . cholecalciferol (VITAMIN D) 1000 UNITS tablet Take 400 Units by mouth daily.   . cyclobenzaprine (FLEXERIL) 10 MG tablet Take 10 mg by mouth 2 (two) times daily at 10 AM and 5 PM.  . doxycycline (VIBRA-TABS) 100 MG tablet Take 1 tablet (100 mg total) by mouth 2 (two) times daily.  . fexofenadine (ALLEGRA) 180 MG tablet Take 1 tablet (180 mg total) by mouth daily. (Patient taking differently: Take 180 mg by mouth as needed. )  . fluticasone (FLONASE) 50 MCG/ACT nasal spray Place 1 spray into both nostrils daily.  [name] Kitchen gabapentin (NEURONTIN) 100 MG capsule Take one tablet in the morning PO, take one tablet midday, and take two-three tablets PO at bedtime.  . hydrocortisone (ANUSOL-HC) 25 MG suppository Place 1 suppository (25 mg total) rectally 2 (two) times daily as needed for hemorrhoids or anal itching.  . levothyroxine (SYNTHROID, LEVOTHROID) 50 MCG tablet Take 1 tablet (50 mcg total) by mouth daily.  [name] Kitchen LINZESS 290 MCG CAPS capsule TAKE 1 CAPSULE (290 MCG TOTAL) BY MOUTH EVERY MORNING BEFORE BREAKFAST KEEP IN ORIGINAL BOTTLE  . losartan (COZAAR) 25 MG tablet Take 1 tablet (25 mg total) by mouth daily.  . Multiple Vitamin (MULTI-VITAMIN) tablet Take by mouth.  . naphazoline-pheniramine (ALLERGY EYE) 0.025-0.3 % ophthalmic solution Place 1 drop into both eyes 4 (four) times daily as needed for eye  irritation or allergies.  [name] Kitchen omeprazole (PRILOSEC) 20 MG capsule TAKE 1 CAPSULE BY MOUTH EVERY DAY  . rosuvastatin (CRESTOR) 40 MG tablet Take 1 tablet (40 mg total) by mouth daily.  . traZODone (DESYREL) 100 MG tablet TAKE 1 TABLET BY MOUTH EVERYDAY AT BEDTIME   No facility-administered encounter medications on file as of 09/05/2018.  Activities of Daily Living In your present state of health, do you have any difficulty performing the following activities: 09/05/2018  Hearing? N  Vision? N  Difficulty concentrating or making decisions? N  Walking or climbing stairs? Y  Dressing or bathing? N  Doing errands, shopping? N  Preparing Food and eating ? N  Using the Toilet? N  In the past six months, have you accidently leaked urine? N  Do you have problems with loss of bowel control? N  Managing your Medications? N  Managing your Finances? N  Housekeeping or managing your Housekeeping? N  Some recent data might be hidden    Patient Care Team: [name], FNP as PCP - General (Family Medicine) Kennith Center, RD as Dietitian (Family Medicine)    Assessment:   This is a routine wellness examination for Dylanie.  I connected with patient 09/05/18 at 10:00 AM EDT by an audio enabled telemedicine application and verified that I am speaking with the correct person using two identifiers. Patient stated full name and DOB. Patient gave permission to continue with virtual visit. Patient's location was at home and Nurse's location was at [place] office.   Health Maintenance Due: Influenza vaccine 2020- discussed; to be completed in season with doctor or local pharmacy.   Update all pending maintenance due as appropriate.   See completed HM at the end of note.   Eye: Visual acuity not assessed. Virtual visit. Wears corrective lenses. Followed by their ophthalmologist every 6 months.   Dental: Visits every 6 months.    Hearing: Patient is followed by [facility]. She does not  wear hearing aids. Some difficulty hearing some conversational tones.   Safety:  Patient feels safe at home- yes Patient does have smoke detectors at home- yes Patient does wear sunscreen or protective clothing when in direct sunlight - yes Patient does wear seat belt when in a moving vehicle - yes Patient drives- yes Adequate lighting in walkways free from debris- yes Grab bars and handrails used as appropriate- yes Ambulates with no assistive device Cell phone on person when ambulating outside of the home- yes  Social: Alcohol intake - yes      Smoking history-  former    Smokers in home? none Illicit drug use? none  Depression: PHQ 2 &9 complete. See screening below. Denies irritability, anhedonia, sadness/tearfullness.  Stable.   Falls: See screening below.    Medication: Taking as directed and without issues.   Covid-19: Precautions and sickness symptoms discussed. Wears mask, social distancing, hand hygiene as appropriate.   Activities of Daily Living Patient denies needing assistance with: household chores, feeding themselves, getting from bed to chair, getting to the toilet, bathing/showering, dressing, managing money, or preparing meals.   Memory: Patient is alert. Patient denies difficulty focusing or concentrating. Correctly identified the president of the [place], season and recall. Patient likes to play computer games for brain stimulation.  BMI- discussed the importance of a healthy diet, water intake and the benefits of aerobic exercise.  Educational material provided.  Physical activity- no routine. Encouraged chair exercise.   Awaiting scheduling for bariatric treatment.   Diet: vegetable, lean meat Water: good intake Ensure/Protein supplement: premier protein at bedtime  Advanced Directive: End of life planning; Advance aging; Advanced directives discussed.  Copy of current HCPOA/Living Will requested.    Other Providers Patient Care Team: [name], FNP as PCP - General (Family Medicine) Kennith Center, RD as Dietitian [facility])  Exercise  Activities and Dietary recommendations    Goals      Patient Stated   . DIET - REDUCE SUGAR INTAKE (pt-stated)     Low cholesterol diet Monitor blood pressure       Fall Risk Fall Risk  09/05/2018 05/22/2018 08/31/2017 03/07/2017 10/03/2016  Falls in the past year? 0 0 No No No  Number falls in past yr: 0 - - - -  Injury with Fall? 0 - - - -  Follow up Falls evaluation completed - - - -    Timed Get Up and Go performed: no, virtual visit  Depression Screen PHQ 2/9 Scores 09/05/2018 06/22/2018 05/22/2018 01/24/2018  PHQ - 2 Score 0 0 0 1  PHQ- 9 Score - 0 - 6     Cognitive Function MMSE - Mini Mental State Exam 08/31/2017  Orientation to time 5  Orientation to Place 5  Registration 3  Attention/ Calculation 5  Recall 3  Language- name 2 objects 2  Language- repeat 1  Language- follow 3 step command 3  Language- read & follow direction 1  Write a sentence 1  Copy design 1  Total score 30     6CIT Screen 09/05/2018  What Year? 0 points  What month? 0 points  What time? 0 points  Count back from 20 0 points  Months in reverse 0 points  Repeat phrase 0 points  Total Score 0    Immunization History  Administered Date(s) Administered  . Hep A / Hep B 05/24/2013, 11/21/2013  . Influenza Split 10/21/2013  . Influenza, High Dose Seasonal PF 10/24/2016  . Influenza,inj,Quad PF,6+ Mos 11/08/2013, 10/28/2014  . Influenza-Unspecified 12/05/2011, 10/28/2015, 10/24/2016, 09/06/2017  . Pneumococcal Conjugate-13 12/05/2014, 04/06/2016  . Pneumococcal Polysaccharide-23 06/05/2017  . Tdap 02/05/2014, 08/07/2018  . Zoster 01/09/2012, 12/05/2014    Immunizations The following Immunizations were discussed: Influenza, shingles, pneumonia, and tetanus.   Screening Tests Health Maintenance  Topic Date Due  . INFLUENZA VACCINE  08/18/2018  . COLONOSCOPY  02/11/2019  .  MAMMOGRAM  02/24/2020  . TETANUS/TDAP  08/06/2028  . DEXA SCAN  Completed  . Hepatitis C Screening  Completed  . PNA vac Low Risk Adult  Completed       Plan:    Keep all routine maintenance appointments.   Follow up with pcp today.   Medicare Attestation I have personally reviewed: The patient's medical and social history Their use of alcohol, tobacco or illicit drugs Their current medications and supplements The patient's functional ability including ADLs,fall risks, home safety risks, cognitive, and hearing and visual impairment Diet and physical activities Evidence for depression    In addition, I have reviewed and discussed with patient certain preventive protocols, quality metrics, and best practice recommendations. A written personalized care plan for preventive services as well as general preventive health recommendations were provided to patient.     Varney Biles, LPN  1/32/4401   Agree with plan. [name], NP

## 2018-09-05 NOTE — Assessment & Plan Note (Signed)
Symptoms consistent with neuropathy. Trial increase of gabapentin.

## 2018-09-05 NOTE — Progress Notes (Signed)
  Patient reports bp readings for the last week.  These readings were taken before bp meds were taken.  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 Describes numbness. No calf swelling or calf tenderness. No injury. No wounds to the feet.   HTN- ranges as per above. Does have values less than 120/80 such 116/63   History, background, results pertinent:   Chronic venous insufficiency- Dr Ronalee Belts and pending lymph pump. A/P/next steps:  Problem List Items Addressed This Visit      Cardiovascular and Mediastinum   Essential hypertension - Primary    Reviewed blood pressure readings which were taken BEFORE blood pressure medications were taken and felt that we should not pursue more aggressive medication as some her DBP and HR are on the low side. Her BP goal is low 120/< 80. However overall pleased as most readings below 130/80.  She will continue to monitor.       Relevant Orders   CBC with Differential/Platelet   Comprehensive metabolic panel   Hemoglobin A1c   Lipid panel   TSH   VITAMIN D 25 Hydroxy (Vit-D Deficiency, Fractures)     Nervous and Auditory   Peripheral neuropathy    Symptoms consistent with neuropathy. Trial increase of gabapentin.       Relevant Medications   gabapentin (NEURONTIN) 100 MG capsule    Other Visit Diagnoses    Right foot pain       Relevant  Medications   gabapentin (NEURONTIN) 100 MG capsule      I spent 25 min  discussing plan of care over the phone.

## 2018-09-05 NOTE — Patient Instructions (Signed)
PLEASE CALL AND Schedule fasting labs and physical in the next couple of months. You may do this with Philis Nettle or another provider as I will be on maternity leave.   Increase gabapentin as discussed; let me know if not improved.   Continue to monitor blood pressure.  Stay safe!

## 2018-09-26 ENCOUNTER — Other Ambulatory Visit: Payer: Self-pay

## 2018-09-26 ENCOUNTER — Ambulatory Visit
Admission: RE | Admit: 2018-09-26 | Discharge: 2018-09-26 | Disposition: A | Discharge status: Home / Self Care | Payer: Medicare Other | Source: Ambulatory Visit | Attending: Otolaryngology | Admitting: Otolaryngology

## 2018-09-26 DIAGNOSIS — R1310 Dysphagia, unspecified: Secondary | ICD-10-CM | POA: Diagnosis not present

## 2018-09-26 DIAGNOSIS — R1312 Dysphagia, oropharyngeal phase: Secondary | ICD-10-CM

## 2018-09-26 NOTE — Therapy (Signed)
[place], [place], [address] Phone: [phone]   Fax:     Modified Barium Swallow  Patient Details  Name: Jennifer [name] MRN: [id number] Date of Birth: 12-20-1950 No data recorded  Encounter Date: 09/26/2018  End of Session - 09/26/18 1336    Visit Number  1    Number of Visits  1    Date for SLP Re-Evaluation  09/26/18    SLP Start Time  1245    SLP Stop Time   1336    SLP Time Calculation (min)  51 min    Activity Tolerance  Patient tolerated treatment well       Past Medical History:  Diagnosis Date  . Anxiety   . Constipation   . Elevated liver enzymes   . Hemorrhoids   . Herpes genitalis   . High cholesterol   . Hyperlipidemia   . Hypertension   . Hypothyroidism   . Osteoarthritis   . Sleep apnea     Past Surgical History:  Procedure Laterality Date  . ABDOMINAL HYSTERECTOMY     total for fibroids no h/o abnormal pap  . bariatric sleeve  2015  . BREAST EXCISIONAL BIOPSY Left 1998  . carpal tunnel repair    . COLONOSCOPY WITH PROPOFOL N/A 02/11/2016   Procedure: COLONOSCOPY WITH PROPOFOL;  Surgeon: Jonathon Bellows, MD;  Location: [facility] ENDOSCOPY;  Service: Endoscopy;  Laterality: N/A;  . HEMORRHOID SURGERY      There were no vitals filed for this visit.      Subjective: Patient behavior: (alertness, ability to follow instructions, etc.): The patient is alert, able to verbalize her swallowing complaint, and follow directions.  Chief complaint: The patient reports frequent choking/coughing with meals and endorses sensation of aspiration.   Objective:  Radiological Procedure: A videoflouroscopic evaluation of oral-preparatory, reflex initiation, and pharyngeal phases of the swallow was performed; as well as a screening of the upper esophageal phase.  I. POSTURE: Upright in MBS chair  II. VIEW: Lateral  III. COMPENSATORY STRATEGIES: N/A  IV. BOLUSES  ADMINISTERED:   Thin Liquid: 1 small, 3 rapid consecutive   Nectar-thick Liquid: 1 large   Honey-thick Liquid: DNT   Puree: 2 teaspoon presentations   Mechanical Soft: 1/4 graham cracker in applesauce  V. RESULTS OF EVALUATION: A. ORAL PREPARATORY PHASE: (The lips, tongue, and velum are observed for strength and coordination)       **Overall Severity Rating: within normal limits   B. SWALLOW INITIATION/REFLEX: (The reflex is normal if "triggered" by the time the bolus reached the base of the tongue)  **Overall Severity Rating: Mild; triggers while falling from the valleculae to the pyriform sinuses  C. PHARYNGEAL PHASE: (Pharyngeal function is normal if the bolus shows rapid, smooth, and continuous transit through the pharynx and there is no pharyngeal residue after the swallow)  **Overall Severity Rating: within normal limits   D. LARYNGEAL PENETRATION: (Material entering into the laryngeal inlet/vestibule but not aspirated) None  E. ASPIRATION: None  F. ESOPHAGEAL PHASE: (Screening of the upper esophagus) No abnormality within the viewable cervical esophagus  ASSESSMENT: This 68 year old woman; with report of choking/strangling with meals; is presenting with minimal oropharyngeal dysphagia characterized by delayed pharyngeal swallow initiation.  Oral control of the bolus including oral hold, rotary mastication, and anterior to posterior transfer is within functional limits.   Aspects of the pharyngeal stage of swallowing including tongue base retraction, hyolaryngeal excursion, epiglottic inversion, and duration/amplitude of UES opening  are within normal limits.  There is no observed pharyngeal residue, laryngeal penetration, or tracheal aspiration.  The patient is not at risk for prandial aspiration.  Delayed pharyngeal swallow initiation is consistent with effects of postnasal drip or laryngopharyngeal reflux (inflammation, edema, and resultant decreased sensation of the larynx and  pharynx).   The patient was reassured that her swallowing is safe from an aspiration standpoint.  PLAN/RECOMMENDATIONS:   A. Diet: Regular   B. Swallowing Precautions: No special precautions indicated from this study   C. Recommended consultation to: follow up with MDs as recommended    D. Therapy recommendations: speech therapy is not indicated   E. Results and recommendations were discussed with the patient immediately following the study and the final report routed to the referring MD.  Oropharyngeal dysphagia - Plan: DG SWALLOW FUNC OP MEDICARE SPEECH PATH, DG SWALLOW FUNC OP MEDICARE SPEECH PATH        Problem List Patient Active Problem List   Diagnosis Date Noted  . Chronic venous insufficiency 05/24/2018  . Lymphedema 05/24/2018  . Leg swelling 05/09/2018  . Peripheral neuropathy 01/24/2018  . Insomnia 07/12/2017  . Constipation 12/15/2016  . Hemorrhoids 12/15/2016  . Hypothyroidism 10/03/2016  . Bradycardia 06/16/2016  . Elevated liver enzymes 04/06/2016  . OSA (obstructive sleep apnea) 04/06/2016  . Fatty liver 12/08/2015  . Arthritis 12/08/2015  . History of bariatric surgery 12/07/2015  . Genital herpes 11/11/2015  . Hyperlipidemia 11/11/2015  . Essential hypertension 11/11/2015  . Routine physical examination 11/11/2015  . Allergic rhinitis 11/11/2015  . GERD (gastroesophageal reflux disease) 11/11/2015  . Impaired fasting glucose 07/17/2012  . Obesity, unspecified 07/17/2012   Jennifer Sea, MS/CCC- SLP  Lou Miner 09/26/2018, 1:37 PM  [facility] [place], [place], [address] Phone: [phone]   Fax:     Name: Jennifer [name] MRN: [id number] Date of Birth: 03/27/1950

## 2018-10-03 ENCOUNTER — Telehealth: Payer: Self-pay | Admitting: Family

## 2018-10-03 DIAGNOSIS — G4733 Obstructive sleep apnea (adult) (pediatric): Secondary | ICD-10-CM | POA: Diagnosis not present

## 2018-10-03 DIAGNOSIS — G894 Chronic pain syndrome: Secondary | ICD-10-CM | POA: Insufficient documentation

## 2018-10-03 DIAGNOSIS — Z6839 Body mass index (BMI) 39.0-39.9, adult: Secondary | ICD-10-CM | POA: Diagnosis not present

## 2018-10-03 DIAGNOSIS — Z9884 Bariatric surgery status: Secondary | ICD-10-CM | POA: Diagnosis not present

## 2018-10-03 NOTE — Telephone Encounter (Signed)
Noted, thanks  LG 

## 2018-10-03 NOTE — Telephone Encounter (Signed)
Jennifer [name],   So you are aware; this patient follows with Dr Clayborn Bigness ( cardiology) as well.   I was planning to advise her that my surgical clearance would be contingent upon [facility] also granted her clearance for bariatric surgery. He would need surgical clearance form as well from Dr Darnell Level.   She hasnt had EKG in some time so I said we do that here and if any changes, concerns, I would call Dr Clayborn Bigness  I defer to all your judgement but just wanted you to know what I had told patient.   She is a sweet lady!

## 2018-10-03 NOTE — Telephone Encounter (Signed)
Call pt  This form would like medical clearance for surgery; I would not feel comfortable with signing without recent EKG to ensure that she doenst for any reason need cardiology consult.   Please advise in person visit

## 2018-10-03 NOTE — Telephone Encounter (Signed)
Pt dropped off bariatric form to be filled out. Please fax to number highlighted. Handed to Judson Roch

## 2018-10-05 ENCOUNTER — Encounter: Payer: Self-pay | Admitting: Family Medicine

## 2018-10-05 ENCOUNTER — Ambulatory Visit (INDEPENDENT_AMBULATORY_CARE_PROVIDER_SITE_OTHER): Payer: Medicare Other | Admitting: Family Medicine

## 2018-10-05 ENCOUNTER — Other Ambulatory Visit: Payer: Self-pay

## 2018-10-05 VITALS — BP 138/82 | HR 77 | Temp 98.8°F | Resp 18 | Ht 61.0 in | Wt 208.0 lb

## 2018-10-05 DIAGNOSIS — Z01818 Encounter for other preprocedural examination: Secondary | ICD-10-CM

## 2018-10-05 NOTE — Progress Notes (Signed)
  Cancelled visit  Not needed  Has cardiology appt planned for clearance

## 2018-10-08 DIAGNOSIS — Z01818 Encounter for other preprocedural examination: Secondary | ICD-10-CM | POA: Diagnosis not present

## 2018-10-08 DIAGNOSIS — Z9884 Bariatric surgery status: Secondary | ICD-10-CM | POA: Diagnosis not present

## 2018-10-09 ENCOUNTER — Telehealth: Payer: Self-pay | Admitting: Family

## 2018-10-09 ENCOUNTER — Encounter: Payer: Self-pay | Admitting: *Deleted

## 2018-10-09 NOTE — Telephone Encounter (Signed)
Labs were ordered in August. Sent pt a mychart to notify her

## 2018-10-09 NOTE — Telephone Encounter (Signed)
Patient called and would like to have her labs done before her CPE and wants to know if labs can be put in so she can schedule appt. Please call patient back when ready.

## 2018-10-11 DIAGNOSIS — I10 Essential (primary) hypertension: Secondary | ICD-10-CM | POA: Diagnosis not present

## 2018-10-11 DIAGNOSIS — R9431 Abnormal electrocardiogram [ECG] [EKG]: Secondary | ICD-10-CM | POA: Diagnosis not present

## 2018-10-11 DIAGNOSIS — G4733 Obstructive sleep apnea (adult) (pediatric): Secondary | ICD-10-CM | POA: Diagnosis not present

## 2018-10-11 DIAGNOSIS — R011 Cardiac murmur, unspecified: Secondary | ICD-10-CM | POA: Diagnosis not present

## 2018-10-11 DIAGNOSIS — R42 Dizziness and giddiness: Secondary | ICD-10-CM | POA: Diagnosis not present

## 2018-10-11 DIAGNOSIS — R0602 Shortness of breath: Secondary | ICD-10-CM | POA: Diagnosis not present

## 2018-10-11 DIAGNOSIS — R001 Bradycardia, unspecified: Secondary | ICD-10-CM | POA: Diagnosis not present

## 2018-10-11 DIAGNOSIS — R600 Localized edema: Secondary | ICD-10-CM | POA: Diagnosis not present

## 2018-10-11 DIAGNOSIS — Z01811 Encounter for preprocedural respiratory examination: Secondary | ICD-10-CM | POA: Diagnosis not present

## 2018-10-11 DIAGNOSIS — I208 Other forms of angina pectoris: Secondary | ICD-10-CM | POA: Diagnosis not present

## 2018-10-11 DIAGNOSIS — E7849 Other hyperlipidemia: Secondary | ICD-10-CM | POA: Diagnosis not present

## 2018-10-11 DIAGNOSIS — R5383 Other fatigue: Secondary | ICD-10-CM | POA: Diagnosis not present

## 2018-10-22 ENCOUNTER — Other Ambulatory Visit: Payer: Self-pay

## 2018-10-22 ENCOUNTER — Other Ambulatory Visit (INDEPENDENT_AMBULATORY_CARE_PROVIDER_SITE_OTHER): Payer: Medicare Other

## 2018-10-22 DIAGNOSIS — I10 Essential (primary) hypertension: Secondary | ICD-10-CM | POA: Diagnosis not present

## 2018-10-22 LAB — LIPID PANEL
Cholesterol: 123 mg/dL (ref 0–200)
HDL: 41.4 mg/dL (ref >39.00)
LDL Cholesterol: 67 mg/dL (ref 0–99)
NonHDL: 82.07
Total CHOL/HDL Ratio: 3
Triglycerides: 76 mg/dL (ref 0.0–149.0)
VLDL: 15.2 mg/dL (ref 0.0–40.0)

## 2018-10-22 LAB — CBC WITH DIFFERENTIAL/PLATELET
Basophils Absolute: 0.1 10*3/uL (ref 0.0–0.1)
Basophils Relative: 0.7 % (ref 0.0–3.0)
Eosinophils Absolute: 0.1 10*3/uL (ref 0.0–0.7)
Eosinophils Relative: 1.1 % (ref 0.0–5.0)
HCT: 41.5 % (ref 36.0–46.0)
Hemoglobin: 12.8 g/dL (ref 12.0–15.0)
Lymphocytes Relative: 50.8 % — ABNORMAL HIGH (ref 12.0–46.0)
Lymphs Abs: 3.6 10*3/uL (ref 0.7–4.0)
MCHC: 30.9 g/dL (ref 30.0–36.0)
MCV: 80.7 fl (ref 78.0–100.0)
Monocytes Absolute: 0.6 10*3/uL (ref 0.1–1.0)
Monocytes Relative: 8.2 % (ref 3.0–12.0)
Neutro Abs: 2.8 10*3/uL (ref 1.4–7.7)
Neutrophils Relative %: 39.2 % — ABNORMAL LOW (ref 43.0–77.0)
Platelets: 169 10*3/uL (ref 150.0–400.0)
RBC: 5.14 Mil/uL — ABNORMAL HIGH (ref 3.87–5.11)
RDW: 15.1 % (ref 11.5–15.5)
WBC: 7 10*3/uL (ref 4.0–10.5)

## 2018-10-22 LAB — COMPREHENSIVE METABOLIC PANEL
ALT: 20 U/L (ref 0–35)
AST: 23 U/L (ref 0–37)
Albumin: 4.2 g/dL (ref 3.5–5.2)
Alkaline Phosphatase: 82 U/L (ref 39–117)
BUN: 11 mg/dL (ref 6–23)
CO2: 30 mEq/L (ref 19–32)
Calcium: 9.6 mg/dL (ref 8.4–10.5)
Chloride: 106 mEq/L (ref 96–112)
Creatinine, Ser: 0.8 mg/dL (ref 0.40–1.20)
GFR: 86.01 mL/min (ref >60.00)
Glucose, Bld: 95 mg/dL (ref 70–99)
Potassium: 4.1 mEq/L (ref 3.5–5.1)
Sodium: 142 mEq/L (ref 135–145)
Total Bilirubin: 0.5 mg/dL (ref 0.2–1.2)
Total Protein: 6.5 g/dL (ref 6.0–8.3)

## 2018-10-22 LAB — TSH: TSH: 2.58 u[IU]/mL (ref 0.35–4.50)

## 2018-10-22 LAB — VITAMIN D 25 HYDROXY (VIT D DEFICIENCY, FRACTURES): VITD: 41.06 ng/mL (ref 30.00–100.00)

## 2018-10-22 LAB — HEMOGLOBIN A1C: Hgb A1c MFr Bld: 6.7 % — ABNORMAL HIGH (ref 4.6–6.5)

## 2018-10-23 DIAGNOSIS — I208 Other forms of angina pectoris: Secondary | ICD-10-CM | POA: Diagnosis not present

## 2018-10-23 DIAGNOSIS — R0602 Shortness of breath: Secondary | ICD-10-CM | POA: Diagnosis not present

## 2018-10-30 DIAGNOSIS — R011 Cardiac murmur, unspecified: Secondary | ICD-10-CM | POA: Diagnosis not present

## 2018-10-30 DIAGNOSIS — R600 Localized edema: Secondary | ICD-10-CM | POA: Diagnosis not present

## 2018-10-30 DIAGNOSIS — G4733 Obstructive sleep apnea (adult) (pediatric): Secondary | ICD-10-CM | POA: Diagnosis not present

## 2018-10-30 DIAGNOSIS — K5909 Other constipation: Secondary | ICD-10-CM | POA: Diagnosis not present

## 2018-10-30 DIAGNOSIS — K648 Other hemorrhoids: Secondary | ICD-10-CM | POA: Diagnosis not present

## 2018-10-30 DIAGNOSIS — R5383 Other fatigue: Secondary | ICD-10-CM | POA: Diagnosis not present

## 2018-10-30 DIAGNOSIS — R9431 Abnormal electrocardiogram [ECG] [EKG]: Secondary | ICD-10-CM | POA: Diagnosis not present

## 2018-10-30 DIAGNOSIS — R0602 Shortness of breath: Secondary | ICD-10-CM | POA: Diagnosis not present

## 2018-10-30 DIAGNOSIS — R42 Dizziness and giddiness: Secondary | ICD-10-CM | POA: Diagnosis not present

## 2018-10-30 DIAGNOSIS — Z01818 Encounter for other preprocedural examination: Secondary | ICD-10-CM | POA: Diagnosis not present

## 2018-10-30 DIAGNOSIS — Z8719 Personal history of other diseases of the digestive system: Secondary | ICD-10-CM | POA: Diagnosis not present

## 2018-10-30 DIAGNOSIS — K219 Gastro-esophageal reflux disease without esophagitis: Secondary | ICD-10-CM | POA: Diagnosis not present

## 2018-10-30 DIAGNOSIS — R001 Bradycardia, unspecified: Secondary | ICD-10-CM | POA: Diagnosis not present

## 2018-10-30 DIAGNOSIS — Z01811 Encounter for preprocedural respiratory examination: Secondary | ICD-10-CM | POA: Diagnosis not present

## 2018-10-30 DIAGNOSIS — E7849 Other hyperlipidemia: Secondary | ICD-10-CM | POA: Diagnosis not present

## 2018-10-30 DIAGNOSIS — I10 Essential (primary) hypertension: Secondary | ICD-10-CM | POA: Diagnosis not present

## 2018-10-31 DIAGNOSIS — G4733 Obstructive sleep apnea (adult) (pediatric): Secondary | ICD-10-CM | POA: Diagnosis not present

## 2018-10-31 DIAGNOSIS — Z6839 Body mass index (BMI) 39.0-39.9, adult: Secondary | ICD-10-CM | POA: Diagnosis not present

## 2018-10-31 DIAGNOSIS — Z9884 Bariatric surgery status: Secondary | ICD-10-CM | POA: Diagnosis not present

## 2018-11-04 ENCOUNTER — Other Ambulatory Visit: Payer: Self-pay | Admitting: Family

## 2018-11-04 DIAGNOSIS — E785 Hyperlipidemia, unspecified: Secondary | ICD-10-CM

## 2018-11-06 DIAGNOSIS — H2513 Age-related nuclear cataract, bilateral: Secondary | ICD-10-CM | POA: Diagnosis not present

## 2018-11-07 DIAGNOSIS — G4733 Obstructive sleep apnea (adult) (pediatric): Secondary | ICD-10-CM | POA: Diagnosis not present

## 2018-11-07 DIAGNOSIS — Z9884 Bariatric surgery status: Secondary | ICD-10-CM | POA: Diagnosis not present

## 2018-11-07 DIAGNOSIS — Z6839 Body mass index (BMI) 39.0-39.9, adult: Secondary | ICD-10-CM | POA: Diagnosis not present

## 2018-11-10 ENCOUNTER — Other Ambulatory Visit: Payer: Self-pay | Admitting: Family

## 2018-11-10 DIAGNOSIS — Z0000 Encounter for general adult medical examination without abnormal findings: Secondary | ICD-10-CM

## 2018-11-12 ENCOUNTER — Other Ambulatory Visit: Payer: Self-pay

## 2018-11-12 ENCOUNTER — Other Ambulatory Visit: Payer: Self-pay | Admitting: Family

## 2018-11-12 ENCOUNTER — Encounter: Payer: Self-pay | Admitting: Pulmonary Disease

## 2018-11-12 ENCOUNTER — Ambulatory Visit (INDEPENDENT_AMBULATORY_CARE_PROVIDER_SITE_OTHER): Payer: Medicare Other | Admitting: Pulmonary Disease

## 2018-11-12 ENCOUNTER — Telehealth: Payer: Self-pay | Admitting: Internal Medicine

## 2018-11-12 DIAGNOSIS — G4733 Obstructive sleep apnea (adult) (pediatric): Secondary | ICD-10-CM

## 2018-11-12 DIAGNOSIS — E039 Hypothyroidism, unspecified: Secondary | ICD-10-CM

## 2018-11-12 NOTE — Assessment & Plan Note (Signed)
Plan: Increase CPAP set pressure to 10 Order placed to DME to adjust ramp up. 4 to 6-week follow-up with sleep MD at [facility] clinic Continue to use CPAP daily

## 2018-11-12 NOTE — Progress Notes (Addendum)
Virtual Visit via Telephone Note  I connected with [name] on 11/12/18 at  2:00 PM EDT by telephone and verified that I am speaking with the correct person using two identifiers.  Location: Patient: Home Provider: [facility] - [id number], [place], [place], [place] [address]   I discussed the limitations, risks, security and privacy concerns of performing an evaluation and management service by telephone and the availability of in person appointments. I also discussed with the patient that there may be a patient responsible charge related to this service. The patient expressed understanding and agreed to proceed.  Patient consented to consult via telephone: Yes People present and their role in pt care: Pt   History of Present Illness:  68 year old female former smoker followed in our office for mild obstructive sleep apnea  Past medical history: Obesity, hypertension, GERD Smoking history: Former smoker.  Quit 1993.  36-pack-year smoking history Maintenance: None Patient of Dr. Mortimer Fries  Chief complaint: CPAP follow-up  68 year old female former smoker followed in our office for mild obstructive sleep apnea.  2018 sleep study shows AHI of 14.1, REM supine sleep shows an AHI of 55.  Patient is maintained on CPAP therapy.  Patient reports she was initially maintained on a CPAP set pressure 10 based on the 2018 CPAP titration study.  At that point in time she is using the nasal pillows mask and she felt that pressure was too strong.  Patient now is changed to a plain nasal mask and she feels that she may be able to tolerate the set pressure of 10.  Patient is also struggling with initial application of CPAP struggling with pressures.  This likely is related to the ramp-up speed.  CPAP compliance report shows excellent compliance.  See compliance report listed below:  10/13/2018-11/11/2018 20-30 had a last 30 days use, all 30 those days greater than 4 hours, average  usage 7 hours and 51 minutes, CPAP set pressure of 9, AHI 0.1   Observations/Objective:  02/17/2016-sleep study-AHI 14.1, REM supine sleep 55.5, SaO2 low 77.8%, time spent below 90% oxygen saturation equal 34 minutes  08/08/2016-CPAP titration-optimal level of CPAP set pressure of 10  Assessment and Plan:  OSA (obstructive sleep apnea) Plan: Increase CPAP set pressure to 10 Order placed to DME to adjust ramp up. 4 to 6-week follow-up with sleep MD at [facility] clinic Continue to use CPAP daily  Follow Up Instructions:  Return in about 6 weeks (around 12/24/2018), or if symptoms worsen or fail to improve, for [facility].   I discussed the assessment and treatment plan with the patient. The patient was provided an opportunity to ask questions and all were answered. The patient agreed with the plan and demonstrated an understanding of the instructions.   The patient was advised to call back or seek an in-person evaluation if the symptoms worsen or if the condition fails to improve as anticipated.  I provided 26 minutes of non-face-to-face time during this encounter.   [name], NP

## 2018-11-12 NOTE — Telephone Encounter (Signed)
Call returned to patient, confirmed DOB, she states she does not feel like the air pressure is enough. I made here aware we increased her pressure to 9 back in march and she states she still feels like she is not getting enough air pressure. Unable to describe what this feels like as she repeated it just does not seem like I am getting enough air. Virtual appt made. Nothing further needed at this time. Appt made with [place] due to Kasa being on ICU unit this week. Nothing further needed at this time.

## 2018-11-12 NOTE — Patient Instructions (Addendum)
You were seen today by [name], NP  for:   1. OSA (obstructive sleep apnea)  New DME order: Change CPAP set pressure to 10 Adjust ramp-up speed as patient is struggling with using CPAP for initial 5 to 10 minutes when applying mask, rate of speed needs to be increased to help adjust patient's symptoms  We recommend that you continue using your CPAP daily >>>Keep up the hard work using your device >>> Goal should be wearing this for the entire night that you are sleeping, at least 4 to 6 hours  Remember:  . Do not drive or operate heavy machinery if tired or drowsy.  . Please notify the supply company and office if you are unable to use your device regularly due to missing supplies or machine being broken.  . Work on maintaining a healthy weight and following your recommended nutrition plan  . Maintain proper daily exercise and movement  . Maintaining proper use of your device can also help improve management of other chronic illnesses such as: Blood pressure, blood sugars, and weight management.   BiPAP/ CPAP Cleaning:  >>>Clean weekly, with Dawn soap, and bottle brush.  Set up to air dry.   Follow Up:    Return in about 6 weeks (around 12/24/2018), or if symptoms worsen or fail to improve, for [facility].   Please do your part to reduce the spread of COVID-19:      Reduce your risk of any infection  and COVID19 by using the similar precautions used for avoiding the common cold or flu:  [name] Kitchen Wash your hands often with soap and warm water for at least 20 seconds.  If soap and water are not readily available, use an alcohol-based hand sanitizer with at least 60% alcohol.  . If coughing or sneezing, cover your mouth and nose by coughing or sneezing into the elbow areas of your shirt or coat, into a tissue or into your sleeve (not your hands). Langley Gauss A MASK when in public  . Avoid shaking hands with others and consider head nods or verbal greetings only. .  Avoid touching your eyes, nose, or mouth with unwashed hands.  . Avoid close contact with people who are sick. . Avoid places or events with large numbers of people in one location, like concerts or sporting events. . If you have some symptoms but not all symptoms, continue to monitor at home and seek medical attention if your symptoms worsen. . If you are having a medical emergency, call 911.   [place] / e-Visit: [email or web address]         [facility]: [facility]: [id number]                   [facility]: [id number]     It is flu season:   >>> Best ways to protect herself from the flu: Receive the yearly flu vaccine, practice good hand hygiene washing with soap and also using hand sanitizer when available, eat a nutritious meals, get adequate rest, hydrate appropriately   Please contact the office if your symptoms worsen or you have concerns that you are not improving.   Thank you for choosing [facility] for your healthcare, and for allowing [name] to partner with you on your healthcare journey. I am thankful to be able to provide care to you today.   Jennifer Quaker FNP-C  Living With Sleep Apnea Sleep apnea is a condition in which breathing pauses or becomes shallow during sleep. Sleep apnea is most commonly caused by a collapsed or blocked airway. People with sleep apnea snore loudly and have times when they gasp and stop breathing for 10 seconds or more during sleep. This happens over and over during the night. This disrupts your sleep and keeps your body from getting the rest that it needs, which can cause tiredness and lack of energy (fatigue) during the day. The breaks in breathing also interrupt the deep sleep that you need to feel rested. Even if you do not completely wake up from the gaps in breathing, your  sleep may not be restful. You may also have a headache in the morning and low energy during the day, and you may feel anxious or depressed. How can sleep apnea affect me? Sleep apnea increases your chances of extreme tiredness during the day (daytime fatigue). It can also increase your risk for health conditions, such as:  Heart attack.  Stroke.  Diabetes.  Heart failure.  Irregular heartbeat.  High blood pressure. If you have daytime fatigue as a result of sleep apnea, you may be more likely to:  Perform poorly at school or work.  Fall asleep while driving.  Have difficulty with attention.  Develop depression or anxiety.  Become severely overweight (obese).  Have sexual dysfunction. What actions can I take to manage sleep apnea? Sleep apnea treatment   If you were given a device to open your airway while you sleep, use it only as told by your health care provider. You may be given: ? An oral appliance. This is a custom-made mouthpiece that shifts your lower jaw forward. ? A continuous positive airway pressure (CPAP) device. This device blows air through a mask when you breathe out (exhale). ? A nasal expiratory positive airway pressure (EPAP) device. This device has valves that you put into each nostril. ? A bi-level positive airway pressure (BPAP) device. This device blows air through a mask when you breathe in (inhale) and breathe out (exhale).  You may need surgery if other treatments do not work for you. Sleep habits  Go to sleep and wake up at the same time every day. This helps set your internal clock (circadian rhythm) for sleeping. ? If you stay up later than usual, such as on weekends, try to get up in the morning within 2 hours of your normal wake time.  Try to get at least 7-9 hours of sleep each night.  Stop computer, tablet, and mobile phone use a few hours before bedtime.  Do not take long naps during the day. If you nap, limit it to 30 minutes.  Have  a relaxing bedtime routine. Reading or listening to music may relax you and help you sleep.  Use your bedroom only for sleep. ? Keep your television and computer out of your bedroom. ? Keep your bedroom cool, dark, and quiet. ? Use a supportive mattress and pillows.  Follow your health care provider's instructions for other changes to sleep habits. Nutrition  Do not eat heavy meals in the evening.  Do not have caffeine in the later part of the day. The effects of caffeine can last for more than 5 hours.  Follow your health care provider's or dietitian's instructions for any diet changes. Lifestyle      Do not drink alcohol before bedtime. Alcohol can cause you to fall asleep at first, but then it can  cause you to wake up in the middle of the night and have trouble getting back to sleep.  Do not use any products that contain nicotine or tobacco, such as cigarettes and e-cigarettes. If you need help quitting, ask your health care provider. Medicines  Take over-the-counter and prescription medicines only as told by your health care provider.  Do not use over-the-counter sleep medicine. You can become dependent on this medicine, and it can make sleep apnea worse.  Do not use medicines, such as sedatives and narcotics, unless told by your health care provider. Activity  Exercise on most days, but avoid exercising in the evening. Exercising near bedtime can interfere with sleeping.  If possible, spend time outside every day. Natural light helps regulate your circadian rhythm. General information  Lose weight if you need to, and maintain a healthy weight.  Keep all follow-up visits as told by your health care provider. This is important.  If you are having surgery, make sure to tell your health care provider that you have sleep apnea. You may need to bring your device with you. Where to find more information Learn more about sleep apnea and daytime fatigue from:  American Sleep  Association: sleepassociation.[place]: sleepfoundation.org  National Heart, Lung, and Blood Institute: https://www.hartman-hill.biz/ Summary  Sleep apnea can cause daytime fatigue and other serious health conditions.  Both sleep apnea and daytime fatigue can be bad for your health and well-being.  You may need to wear a device while sleeping to help keep your airway open.  If you are having surgery, make sure to tell your health care provider that you have sleep apnea. You may need to bring your device with you.  Making changes to sleep habits, diet, lifestyle, and activity can help you manage sleep apnea. This information is not intended to replace advice given to you by your health care provider. Make sure you discuss any questions you have with your health care provider. Document Released: 03/30/2017 Document Revised: 04/27/2018 Document Reviewed: 03/30/2017 Elsevier Patient Education  [place].    CPAP and BPAP Information CPAP and BPAP are methods of helping a person breathe with the use of air pressure. CPAP stands for "continuous positive airway pressure." BPAP stands for "bi-level positive airway pressure." In both methods, air is blown through your nose or mouth and into your air passages to help you breathe well. CPAP and BPAP use different amounts of pressure to blow air. With CPAP, the amount of pressure stays the same while you breathe in and out. With BPAP, the amount of pressure is increased when you breathe in (inhale) so that you can take larger breaths. Your health care provider will recommend whether CPAP or BPAP would be more helpful for you. Why are CPAP and BPAP treatments used? CPAP or BPAP can be helpful if you have:  Sleep apnea.  Chronic obstructive pulmonary disease (COPD).  Heart failure.  Medical conditions that weaken the muscles of the chest including muscular dystrophy, or neurological diseases such as amyotrophic lateral sclerosis  (ALS).  Other problems that cause breathing to be weak, abnormal, or difficult. CPAP is most commonly used for obstructive sleep apnea (OSA) to keep the airways from collapsing when the muscles relax during sleep. How is CPAP or BPAP administered? Both CPAP and BPAP are provided by a small machine with a flexible plastic tube that attaches to a plastic mask. You wear the mask. Air is blown through the mask into your nose or  mouth. The amount of pressure that is used to blow the air can be adjusted on the machine. Your health care provider will determine the pressure setting that should be used based on your individual needs. When should CPAP or BPAP be used? In most cases, the mask only needs to be worn during sleep. Generally, the mask needs to be worn throughout the night and during any daytime naps. People with certain medical conditions may also need to wear the mask at other times when they are awake. Follow instructions from your health care provider about when to use the machine. What are some tips for using the mask?   Because the mask needs to be snug, some people feel trapped or closed-in (claustrophobic) when first using the mask. If you feel this way, you may need to get used to the mask. One way to do this is by holding the mask loosely over your nose or mouth and then gradually applying the mask more snugly. You can also gradually increase the amount of time that you use the mask.  Masks are available in various types and sizes. Some fit over your mouth and nose while others fit over just your nose. If your mask does not fit well, talk with your health care provider about getting a different one.  If you are using a mask that fits over your nose and you tend to breathe through your mouth, a chin strap may be applied to help keep your mouth closed.  The CPAP and BPAP machines have alarms that may sound if the mask comes off or develops a leak.  If you have trouble with the mask, it is  very important that you talk with your health care provider about finding a way to make the mask easier to tolerate. Do not stop using the mask. Stopping the use of the mask could have a negative impact on your health. What are some tips for using the machine?  Place your CPAP or BPAP machine on a secure table or stand near an electrical outlet.  Know where the on/off switch is located on the machine.  Follow instructions from your health care provider about how to set the pressure on your machine and when you should use it.  Do not eat or drink while the CPAP or BPAP machine is on. Food or fluids could get pushed into your lungs by the pressure of the CPAP or BPAP.  Do not smoke. Tobacco smoke residue can damage the machine.  For home use, CPAP and BPAP machines can be rented or purchased through home health care companies. Many different brands of machines are available. Renting a machine before purchasing may help you find out which particular machine works well for you.  Keep the CPAP or BPAP machine and attachments clean. Ask your health care provider for specific instructions. Get help right away if:  You have redness or open areas around your nose or mouth where the mask fits.  You have trouble using the CPAP or BPAP machine.  You cannot tolerate wearing the CPAP or BPAP mask.  You have pain, discomfort, and bloating in your abdomen. Summary  CPAP and BPAP are methods of helping a person breathe with the use of air pressure.  Both CPAP and BPAP are provided by a small machine with a flexible plastic tube that attaches to a plastic mask.  If you have trouble with the mask, it is very important that you talk with your health care provider  about finding a way to make the mask easier to tolerate. This information is not intended to replace advice given to you by your health care provider. Make sure you discuss any questions you have with your health care provider. Document  Released: 10/02/2003 Document Revised: 04/25/2018 Document Reviewed: 11/23/2015 Elsevier Patient Education  2020 [employer].

## 2018-11-13 NOTE — Progress Notes (Signed)
Reviewed and agree with assessment/plan.   Phillipe Clemon, MD [facility] 01/13/2016, 12:24 PM Pager:  [phone]  

## 2018-11-14 DIAGNOSIS — M7582 Other shoulder lesions, left shoulder: Secondary | ICD-10-CM | POA: Diagnosis not present

## 2018-11-14 DIAGNOSIS — M7581 Other shoulder lesions, right shoulder: Secondary | ICD-10-CM | POA: Diagnosis not present

## 2018-11-14 DIAGNOSIS — Z6841 Body Mass Index (BMI) 40.0 and over, adult: Secondary | ICD-10-CM | POA: Insufficient documentation

## 2018-11-23 ENCOUNTER — Other Ambulatory Visit: Payer: Self-pay

## 2018-11-23 ENCOUNTER — Ambulatory Visit (INDEPENDENT_AMBULATORY_CARE_PROVIDER_SITE_OTHER): Payer: Medicare Other | Admitting: Family Medicine

## 2018-11-23 ENCOUNTER — Encounter: Payer: Self-pay | Admitting: Family Medicine

## 2018-11-23 VITALS — BP 130/70 | HR 80 | Temp 97.0°F | Ht 62.0 in | Wt 216.4 lb

## 2018-11-23 DIAGNOSIS — Z0000 Encounter for general adult medical examination without abnormal findings: Secondary | ICD-10-CM | POA: Diagnosis not present

## 2018-11-23 DIAGNOSIS — Z780 Asymptomatic menopausal state: Secondary | ICD-10-CM

## 2018-11-23 NOTE — Progress Notes (Signed)
Subjective:    Patient ID: Jennifer [name], female    DOB: 1950/12/07, 68 y.o.   MRN: [id number]  HPI   Patient reports presents to clinic for CPE.  Overall she is feeling well.  She continues to work with bariatric surgery with hopes of getting bariatric surgery in March 2021 to help aid in her weight loss journey.  Mammogram was done in February 2020.  Colonoscopy is due in 2022 and last DEXA bone density was done in 2016.  Patient recently had lab work done, reviewed and all is stable.  See eye doctor usually every 1 year, sees dentist every 6 to 12 months.  Patient Active Problem List   Diagnosis Date Noted  . Chronic venous insufficiency 05/24/2018  . Lymphedema 05/24/2018  . Leg swelling 05/09/2018  . Peripheral neuropathy 01/24/2018  . Insomnia 07/12/2017  . Constipation 12/15/2016  . Hemorrhoids 12/15/2016  . Hypothyroidism 10/03/2016  . Bradycardia 06/16/2016  . Elevated liver enzymes 04/06/2016  . OSA (obstructive sleep apnea) 04/06/2016  . Fatty liver 12/08/2015  . Arthritis 12/08/2015  . History of bariatric surgery 12/07/2015  . Genital herpes 11/11/2015  . Hyperlipidemia 11/11/2015  . Essential hypertension 11/11/2015  . Routine physical examination 11/11/2015  . Allergic rhinitis 11/11/2015  . GERD (gastroesophageal reflux disease) 11/11/2015  . Impaired fasting glucose 07/17/2012  . Obesity, unspecified 07/17/2012   Social History   Tobacco Use  . Smoking status: Former Smoker    Packs/day: 1.50    Years: 24.00    Pack years: 36.00    Types: Cigarettes    Quit date: 1993    Years since quitting: 27.8  . Smokeless tobacco: Never Used  . Tobacco comment: quit 1995.   Substance Use Topics  . Alcohol use: Yes    Alcohol/week: 14.0 standard drinks    Types: 14 Glasses of wine per week    Comment: occ   Past Surgical History:  Procedure Laterality Date  . ABDOMINAL HYSTERECTOMY     total for fibroids no h/o abnormal pap  . bariatric sleeve   2015  . BREAST EXCISIONAL BIOPSY Left 1998  . carpal tunnel repair    . COLONOSCOPY WITH PROPOFOL N/A 02/11/2016   Procedure: COLONOSCOPY WITH PROPOFOL;  Surgeon: Jonathon Bellows, MD;  Location: [facility] ENDOSCOPY;  Service: Endoscopy;  Laterality: N/A;  . HEMORRHOID SURGERY     Family History  Problem Relation Age of Onset  . Breast cancer Sister 7       materal 1/2 sister  . Hypertension Sister   . Hypertension Mother   . Heart disease Father   . Hypertension Brother     Review of Systems  Constitutional: Negative for chills, fatigue and fever.  HENT: Negative for congestion, ear pain, sinus pain and sore throat.   Eyes: Negative.   Respiratory: Negative for cough, shortness of breath and wheezing.   Cardiovascular: Negative for chest pain, palpitations and leg swelling.  Gastrointestinal: Negative for abdominal pain, diarrhea, nausea and vomiting.  Genitourinary: Negative for dysuria, frequency and urgency.  Musculoskeletal: Negative for arthralgias and myalgias.  Skin: Negative for color change, pallor and rash.  Neurological: Negative for syncope, light-headedness and headaches.  Psychiatric/Behavioral: The patient is not nervous/anxious.       Objective:   Physical Exam Vitals signs and nursing note reviewed.  Constitutional:      Appearance: She is well-developed.  HENT:     Head: Normocephalic and atraumatic.     Right Ear: Tympanic membrane, ear canal  and external ear normal.     Left Ear: Tympanic membrane, ear canal and external ear normal.  Eyes:     General: No scleral icterus.       Right eye: No discharge.        Left eye: No discharge.     Extraocular Movements: Extraocular movements intact.     Conjunctiva/sclera: Conjunctivae normal.     Pupils: Pupils are equal, round, and reactive to light.  Neck:     Musculoskeletal: Normal range of motion and neck supple.     Trachea: No tracheal deviation.  Cardiovascular:     Rate and Rhythm: Normal rate and regular  rhythm.     Heart sounds: Normal heart sounds.  Pulmonary:     Effort: Pulmonary effort is normal. No respiratory distress.     Breath sounds: Normal breath sounds. No wheezing or rales.  Chest:     Chest wall: No tenderness.     Breasts:        Right: No swelling, bleeding, inverted nipple, mass, nipple discharge, skin change or tenderness.        Left: No swelling, bleeding, inverted nipple, mass, nipple discharge, skin change or tenderness.  Abdominal:     General: Bowel sounds are normal.     Palpations: Abdomen is soft.     Tenderness: There is no abdominal tenderness. There is no guarding.  Musculoskeletal: Normal range of motion.     Right lower leg: No edema.     Left lower leg: No edema.  Lymphadenopathy:     Cervical: No cervical adenopathy.     Upper Body:     Right upper body: No supraclavicular, axillary or pectoral adenopathy.     Left upper body: No supraclavicular, axillary or pectoral adenopathy.  Skin:    General: Skin is warm and dry.     Capillary Refill: Capillary refill takes less than 2 seconds.     Coloration: Skin is not jaundiced or pale.  Neurological:     Mental Status: She is alert and oriented to person, place, and time.     Cranial Nerves: No cranial nerve deficit.     Motor: No weakness.  Psychiatric:        Mood and Affect: Mood normal.        Behavior: Behavior normal.        Thought Content: Thought content normal.    Today's Vitals   11/23/18 0913  BP: 130/70  Pulse: 80  Temp: (!) 97 F (36.1 C)  TempSrc: Temporal  SpO2: 97%  Weight: 216 lb 6.4 oz (98.2 kg)  Height: 5\' 2"  (1.575 m)   Body mass index is 39.58 kg/m.     Assessment & Plan:    Well adult health check  Post-menopausal - Plan: DG BONE DENSITY (DXA)  Overall patient is a well-appearing 68 year old female with stable chronic medical conditions.  Lab work is stable and up-to-date.  Mammogram up-to-date.  Colonoscopy up-to-date.  We will get DEXA scan.  She will  continue to work on healthy diet and regular exercise and continue her weight loss journey with bariatric surgery program.  She sees eye doctor and dentist regularly.  Always wear seatbelt in car.    She will do annual CPE and follow up regularly with PCP

## 2018-11-28 ENCOUNTER — Encounter: Payer: Self-pay | Admitting: Family

## 2018-11-28 DIAGNOSIS — Z9884 Bariatric surgery status: Secondary | ICD-10-CM | POA: Diagnosis not present

## 2018-11-28 DIAGNOSIS — E559 Vitamin D deficiency, unspecified: Secondary | ICD-10-CM | POA: Diagnosis not present

## 2018-11-28 DIAGNOSIS — Z6841 Body Mass Index (BMI) 40.0 and over, adult: Secondary | ICD-10-CM | POA: Diagnosis not present

## 2018-11-28 DIAGNOSIS — R768 Other specified abnormal immunological findings in serum: Secondary | ICD-10-CM | POA: Diagnosis not present

## 2018-11-28 DIAGNOSIS — G4733 Obstructive sleep apnea (adult) (pediatric): Secondary | ICD-10-CM | POA: Diagnosis not present

## 2018-11-30 ENCOUNTER — Telehealth (INDEPENDENT_AMBULATORY_CARE_PROVIDER_SITE_OTHER): Payer: Self-pay

## 2018-11-30 NOTE — Telephone Encounter (Signed)
Patient left a message asking about the status for the compression pump. I returned a call back to the patient and left a voicemail.

## 2018-12-02 DIAGNOSIS — R768 Other specified abnormal immunological findings in serum: Secondary | ICD-10-CM | POA: Diagnosis not present

## 2018-12-03 DIAGNOSIS — Z713 Dietary counseling and surveillance: Secondary | ICD-10-CM | POA: Diagnosis not present

## 2018-12-03 DIAGNOSIS — E669 Obesity, unspecified: Secondary | ICD-10-CM | POA: Diagnosis not present

## 2018-12-03 DIAGNOSIS — Z9884 Bariatric surgery status: Secondary | ICD-10-CM | POA: Diagnosis not present

## 2018-12-05 ENCOUNTER — Ambulatory Visit
Admission: RE | Admit: 2018-12-05 | Discharge: 2018-12-05 | Disposition: A | Discharge status: Home / Self Care | Payer: Medicare Other | Source: Ambulatory Visit | Attending: Family Medicine | Admitting: Family Medicine

## 2018-12-05 DIAGNOSIS — Z780 Asymptomatic menopausal state: Secondary | ICD-10-CM | POA: Diagnosis not present

## 2018-12-18 ENCOUNTER — Ambulatory Visit: Payer: Medicare Other | Admitting: Pulmonary Disease

## 2018-12-25 DIAGNOSIS — I208 Other forms of angina pectoris: Secondary | ICD-10-CM | POA: Diagnosis not present

## 2018-12-25 DIAGNOSIS — Z01818 Encounter for other preprocedural examination: Secondary | ICD-10-CM | POA: Diagnosis not present

## 2018-12-25 DIAGNOSIS — R42 Dizziness and giddiness: Secondary | ICD-10-CM | POA: Diagnosis not present

## 2018-12-25 DIAGNOSIS — R600 Localized edema: Secondary | ICD-10-CM | POA: Diagnosis not present

## 2018-12-25 DIAGNOSIS — R0602 Shortness of breath: Secondary | ICD-10-CM | POA: Diagnosis not present

## 2018-12-25 DIAGNOSIS — K219 Gastro-esophageal reflux disease without esophagitis: Secondary | ICD-10-CM | POA: Diagnosis not present

## 2018-12-25 DIAGNOSIS — E669 Obesity, unspecified: Secondary | ICD-10-CM | POA: Diagnosis not present

## 2018-12-25 DIAGNOSIS — R001 Bradycardia, unspecified: Secondary | ICD-10-CM | POA: Diagnosis not present

## 2018-12-25 DIAGNOSIS — R5383 Other fatigue: Secondary | ICD-10-CM | POA: Diagnosis not present

## 2018-12-25 DIAGNOSIS — R011 Cardiac murmur, unspecified: Secondary | ICD-10-CM | POA: Diagnosis not present

## 2018-12-25 DIAGNOSIS — G4733 Obstructive sleep apnea (adult) (pediatric): Secondary | ICD-10-CM | POA: Diagnosis not present

## 2018-12-25 DIAGNOSIS — I10 Essential (primary) hypertension: Secondary | ICD-10-CM | POA: Diagnosis not present

## 2018-12-26 DIAGNOSIS — Z9884 Bariatric surgery status: Secondary | ICD-10-CM | POA: Diagnosis not present

## 2018-12-26 DIAGNOSIS — Z724 Inappropriate diet and eating habits: Secondary | ICD-10-CM | POA: Diagnosis not present

## 2018-12-26 DIAGNOSIS — G4733 Obstructive sleep apnea (adult) (pediatric): Secondary | ICD-10-CM | POA: Diagnosis not present

## 2018-12-26 DIAGNOSIS — F4323 Adjustment disorder with mixed anxiety and depressed mood: Secondary | ICD-10-CM | POA: Diagnosis not present

## 2018-12-27 ENCOUNTER — Other Ambulatory Visit: Payer: Self-pay | Admitting: Family

## 2018-12-27 DIAGNOSIS — I10 Essential (primary) hypertension: Secondary | ICD-10-CM

## 2019-01-14 ENCOUNTER — Telehealth: Payer: Self-pay | Admitting: Family

## 2019-01-14 ENCOUNTER — Other Ambulatory Visit: Payer: Self-pay

## 2019-01-14 DIAGNOSIS — R6889 Other general symptoms and signs: Secondary | ICD-10-CM

## 2019-01-14 DIAGNOSIS — J3489 Other specified disorders of nose and nasal sinuses: Secondary | ICD-10-CM

## 2019-01-14 MED ORDER — FEXOFENADINE HCL 180 MG PO TABS: 180.0000 mg | ORAL_TABLET | Freq: Every day | ORAL | 3 refills | Status: DC

## 2019-01-14 NOTE — Telephone Encounter (Signed)
Pt called about having issues feet are swelling up due to medication of losartan (COZAAR) 25 MG tablet increased from cardiologist from 25mg  to 50mg . And constipation. Please advise and thank you!  Pharmacy is [id number] - [place], [place] - [address]  Call pt @ [phone].

## 2019-01-14 NOTE — Telephone Encounter (Signed)
Call pt Blood pressure is acceptable.  Without a visit, I am hesitant to advise her to start Lasix or something like hydrochlorothiazide for the leg swelling.  I would treat her triage and if her symptom certainly if unilateral swelling with shortness of breath, we would have concerns for blood clot and she would need to be in the emergency room evaluation.   Otherwise, I would advise compression stockings, elevation and low-salt until she can be seen by tullo. DId she have more salt over the holiday?  I am reassured as I reviewed her last echocardiogram October of this year and her ejection fraction 55%, so does not appear she has heart failure which can certainly call swelling the legs.  It is likely dependent edema.  Elevation, compression is really first-line therapy.  She does have a history as well of venous insufficiency which can contribute Lastly she is on a low-dose amlodipine, and more likely this medication ( not losartan) can cotribute to lower extremity swelling although typically is to the higher doses such as 5 mg or 10 mg.   Since her blood pressures been unruly of late, I would not recommend discontinuing this medication at this time though something to consider.

## 2019-01-14 NOTE — Telephone Encounter (Signed)
I called and spoke with patient & asked that she please call Dr. Etta Quill office to let him know if this side effect. Patient stated that her BP has come down & was before medication this morning 133/67. She does not know what to do about the leg swelling & said that she does still have some lasix that were prescribed. She said that she was told to d/c those though. She also has a VV with Dr. Derrel Nip Wednesday @ 4:30p. I told her that I would send to you for advise?

## 2019-01-14 NOTE — Telephone Encounter (Signed)
I spoke with patient & advised on below. She conformed no SOB & swelling is in both feet she said below the ankle. She said that she did have some salt over the holiday as well but she did not think that it was in excess. She said that she would wear her compression stockings during the day & elevate at night. I asked that if any changes that she let us know or if any SOB that she proceed to nearest ED. Patient will keep appointment with Dr. Derrel Nip on Wednesday.

## 2019-01-14 NOTE — Telephone Encounter (Signed)
Noted  

## 2019-01-16 ENCOUNTER — Encounter: Payer: Self-pay | Admitting: Internal Medicine

## 2019-01-16 ENCOUNTER — Telehealth: Payer: Self-pay | Admitting: Internal Medicine

## 2019-01-16 ENCOUNTER — Ambulatory Visit (INDEPENDENT_AMBULATORY_CARE_PROVIDER_SITE_OTHER): Payer: Medicare Other | Admitting: Internal Medicine

## 2019-01-16 ENCOUNTER — Other Ambulatory Visit: Payer: Self-pay

## 2019-01-16 VITALS — Ht 62.0 in | Wt 216.0 lb

## 2019-01-16 DIAGNOSIS — Z79899 Other long term (current) drug therapy: Secondary | ICD-10-CM

## 2019-01-16 DIAGNOSIS — M7989 Other specified soft tissue disorders: Secondary | ICD-10-CM | POA: Diagnosis not present

## 2019-01-16 NOTE — Progress Notes (Signed)
Telephone  Note  This visit type was conducted due to national recommendations for restrictions regarding the COVID-19 pandemic (e.g. social distancing).  This format is felt to be most appropriate for this patient at this time.  All issues noted in this document were discussed and addressed.  No physical exam was performed (except for noted visual exam findings with Video Visits).   I connected with@ on 01/16/19 at  4:30 PM EST by  telephone and verified that I am speaking with the correct person using two identifiers. Location patient: home Location provider: work or home office Persons participating in the virtual visit: patient, provider  I discussed the limitations, risks, security and privacy concerns of performing an evaluation and management service by telephone and the availability of in person appointments. I also discussed with the patient that there may be a patient responsible charge related to this service. The patient expressed understanding and agreed to proceed.  Reason for visit: LE edema  HPI:  68 yr old female with lymphedema, chronic venous insufficiency, OSA presents  with lower extremity edema occurring over the last several weeks .   Saw her cardiologist yesterday who stopped her amlodipine and started her on lasix 20 mg daily and increased her losartan dose to 50 mg daily   Has been diagnosed with lymphedema but has not received the pumping/garments  From the company her vascular surgeon ordered them from for unclear reasons. She states that she has been waiting nearly 2 months.  Was contacted by somebody called stacey who gave her the contact info for two men who she has called and gotten nowhere.  She has been Wearing compression stockings during the day,  But not elevating her legs .  Working on reducing the sodium in her diet.    John [phone] and Matt [phone]  Provided by Erline Levine. [phone]  X [phone]    ROS: See pertinent positives and negatives per  HPI.  Past Medical History:  Diagnosis Date  . Anxiety   . Constipation   . Elevated liver enzymes   . Hemorrhoids   . Herpes genitalis   . High cholesterol   . Hyperlipidemia   . Hypertension   . Hypothyroidism   . Osteoarthritis   . Sleep apnea     Past Surgical History:  Procedure Laterality Date  . ABDOMINAL HYSTERECTOMY     total for fibroids no h/o abnormal pap  . bariatric sleeve  2015  . BREAST EXCISIONAL BIOPSY Left 1998  . carpal tunnel repair    . COLONOSCOPY WITH PROPOFOL N/A 02/11/2016   Procedure: COLONOSCOPY WITH PROPOFOL;  Surgeon: Jonathon Bellows, MD;  Location: [facility] ENDOSCOPY;  Service: Endoscopy;  Laterality: N/A;  . HEMORRHOID SURGERY      Family History  Problem Relation Age of Onset  . Breast cancer Sister 64       materal 1/2 sister  . Hypertension Sister   . Hypertension Mother   . Heart disease Father   . Hypertension Brother     SOCIAL HX:  reports that she quit smoking about 28 years ago. Her smoking use included cigarettes. She has a 36.00 pack-year smoking history. She has never used smokeless tobacco. She reports current alcohol use of about 14.0 standard drinks of alcohol per week. She reports that she does not use drugs.   Current Outpatient Medications:  .  acyclovir (ZOVIRAX) 400 MG tablet, TAKE 1 TABLET BY MOUTH EVERY DAY, Disp: 30 tablet, Rfl: 5 .  amLODipine (NORVASC) 2.5 MG tablet, TAKE 1 TABLET IF BLOOD PRESSURE IS ABOVE 140/70., Disp: 30 tablet, Rfl: 2 .  aspirin 81 MG tablet, Take 81 mg by mouth daily., Disp: , Rfl:  .  azelastine (ASTELIN) 0.1 % nasal spray, USE 1 SPRAY EACH NOSTRIL TWICE A DAY AS NEEDED FOR ALLERGIES, Disp: , Rfl:  .  cholecalciferol (VITAMIN D) 1000 UNITS tablet, Take 400 Units by mouth daily. , Disp: , Rfl:  .  cyclobenzaprine (FLEXERIL) 10 MG tablet, Take 10 mg by mouth 2 (two) times daily at 10 AM and 5 PM., Disp: , Rfl:  .  fexofenadine (ALLEGRA) 180 MG tablet, Take 1 tablet (180 mg total) by mouth daily.,  Disp: 30 tablet, Rfl: 3 .  fluticasone (FLONASE) 50 MCG/ACT nasal spray, Place 1 spray into both nostrils daily., Disp: 16 g, Rfl: 2 .  furosemide (LASIX) 20 MG tablet, Take by mouth., Disp: , Rfl:  .  gabapentin (NEURONTIN) 100 MG capsule, Take one tablet in the morning PO, take one tablet midday, and take two-three tablets PO at bedtime., Disp: 90 capsule, Rfl: 5 .  hydrocortisone (ANUSOL-HC) 25 MG suppository, Place 1 suppository (25 mg total) rectally 2 (two) times daily as needed for hemorrhoids or anal itching., Disp: 12 suppository, Rfl: 2 .  levothyroxine (SYNTHROID) 50 MCG tablet, TAKE 1 TABLET BY MOUTH EVERY DAY, Disp: 30 tablet, Rfl: 5 .  losartan (COZAAR) 50 MG tablet, Take by mouth., Disp: , Rfl:  .  lubiprostone (AMITIZA) 24 MCG capsule, Take 1 tablet by mouth daily., Disp: , Rfl:  .  Multiple Vitamin (MULTI-VITAMIN) tablet, Take by mouth., Disp: , Rfl:  .  naphazoline-pheniramine (ALLERGY EYE) 0.025-0.3 % ophthalmic solution, Place 1 drop into both eyes 4 (four) times daily as needed for eye irritation or allergies., Disp: 15 mL, Rfl: 1 .  omeprazole (PRILOSEC) 20 MG capsule, TAKE 1 CAPSULE BY MOUTH EVERY DAY, Disp: 30 capsule, Rfl: 5 .  rosuvastatin (CRESTOR) 40 MG tablet, TAKE 1 TABLET BY MOUTH EVERY DAY, Disp: 30 tablet, Rfl: 8 .  traZODone (DESYREL) 100 MG tablet, TAKE 1 TABLET BY MOUTH EVERYDAY AT BEDTIME, Disp: 90 tablet, Rfl: 16  EXAM:  VITALS per patient if applicable:  GENERAL: alert, oriented, appears well and in no acute distress  HEENT: atraumatic, conjunttiva clear, no obvious abnormalities on inspection of external nose and ears  NECK: normal movements of the head and neck  LUNGS: on inspection no signs of respiratory distress, breathing rate appears normal, no obvious gross SOB, gasping or wheezing  CV: no obvious cyanosis  MS: moves all visible extremities without noticeable abnormality  PSYCH/NEURO: pleasant and cooperative, no obvious depression or  anxiety, speech and thought processing grossly intact  ASSESSMENT AND PLAN:  Discussed the following assessment and plan:  Long-term current use of high risk medication other than anticoagulant - Plan: Comprehensive metabolic panel  Leg swelling  Leg swelling Chronic,  Multifactorial.  Aggravated by use of amlodipine , lack of leg elevation,  And untreated lymphedema.  Continue furosemide 20 mg daily started by Dr Clayborn Bigness, d/c amlodipine, start elevating legs,  And obtain lymphedema pumping.  Start daily potassium 10 meQ daily      I discussed the assessment and treatment plan with the patient. The patient was provided an opportunity to ask questions and all were answered. The patient agreed with the plan and demonstrated an understanding of the instructions.   The patient was advised to call back or seek an in-person evaluation if  the symptoms worsen or if the condition fails to improve as anticipated.  I provided 30 minutes of non-face-to-face time during this encounter.   Crecencio Mc, MD

## 2019-01-18 NOTE — Assessment & Plan Note (Signed)
Chronic,  Multifactorial.  Aggravated by use of amlodipine , lack of leg elevation,  And untreated lymphedema.  Continue furosemide 20 mg daily started by Dr Clayborn Bigness, d/c amlodipine, start elevating legs,  And obtain lymphedema pumping.  Start daily potassium 10 meQ daily

## 2019-01-21 NOTE — Telephone Encounter (Signed)
Seen by tullo 

## 2019-01-23 ENCOUNTER — Telehealth: Payer: Self-pay | Admitting: Family

## 2019-01-23 DIAGNOSIS — G4733 Obstructive sleep apnea (adult) (pediatric): Secondary | ICD-10-CM | POA: Diagnosis not present

## 2019-01-23 DIAGNOSIS — Z903 Acquired absence of stomach [part of]: Secondary | ICD-10-CM | POA: Diagnosis not present

## 2019-01-23 DIAGNOSIS — Z9884 Bariatric surgery status: Secondary | ICD-10-CM | POA: Diagnosis not present

## 2019-01-23 DIAGNOSIS — Z6841 Body Mass Index (BMI) 40.0 and over, adult: Secondary | ICD-10-CM | POA: Diagnosis not present

## 2019-01-23 NOTE — Telephone Encounter (Signed)
Pt dropped off Bariatric form to be filled out and faxed to [phone] Placed in Margaret's color folder upfront

## 2019-01-25 ENCOUNTER — Other Ambulatory Visit: Payer: Self-pay | Admitting: Family

## 2019-01-25 DIAGNOSIS — Z1231 Encounter for screening mammogram for malignant neoplasm of breast: Secondary | ICD-10-CM

## 2019-01-25 NOTE — Telephone Encounter (Signed)
patient will need appointment for any  surgery clearance Please call her

## 2019-01-28 NOTE — Telephone Encounter (Signed)
Patient scheduled for VV on Friday 1/15.

## 2019-01-28 NOTE — Telephone Encounter (Signed)
Yes VV Now do it on a Friday/weds when I can be in office in case she needs labs etc  Also, advise her that she will need to send form to her cardiologist, Dr Clayborn Bigness as well.   We can send the same form however she would need to be cleared from cardiology as well.

## 2019-01-29 DIAGNOSIS — Z01818 Encounter for other preprocedural examination: Secondary | ICD-10-CM | POA: Diagnosis not present

## 2019-01-29 DIAGNOSIS — Z20822 Contact with and (suspected) exposure to covid-19: Secondary | ICD-10-CM | POA: Diagnosis not present

## 2019-01-31 DIAGNOSIS — K219 Gastro-esophageal reflux disease without esophagitis: Secondary | ICD-10-CM | POA: Diagnosis not present

## 2019-01-31 DIAGNOSIS — K760 Fatty (change of) liver, not elsewhere classified: Secondary | ICD-10-CM | POA: Diagnosis not present

## 2019-01-31 DIAGNOSIS — Z79890 Hormone replacement therapy (postmenopausal): Secondary | ICD-10-CM | POA: Diagnosis not present

## 2019-01-31 DIAGNOSIS — Z8349 Family history of other endocrine, nutritional and metabolic diseases: Secondary | ICD-10-CM | POA: Diagnosis not present

## 2019-01-31 DIAGNOSIS — K2950 Unspecified chronic gastritis without bleeding: Secondary | ICD-10-CM | POA: Diagnosis not present

## 2019-01-31 DIAGNOSIS — Z8719 Personal history of other diseases of the digestive system: Secondary | ICD-10-CM | POA: Diagnosis not present

## 2019-01-31 DIAGNOSIS — Z833 Family history of diabetes mellitus: Secondary | ICD-10-CM | POA: Diagnosis not present

## 2019-01-31 DIAGNOSIS — E079 Disorder of thyroid, unspecified: Secondary | ICD-10-CM | POA: Diagnosis not present

## 2019-01-31 DIAGNOSIS — K2970 Gastritis, unspecified, without bleeding: Secondary | ICD-10-CM | POA: Diagnosis not present

## 2019-01-31 DIAGNOSIS — Z8249 Family history of ischemic heart disease and other diseases of the circulatory system: Secondary | ICD-10-CM | POA: Diagnosis not present

## 2019-01-31 DIAGNOSIS — Z79899 Other long term (current) drug therapy: Secondary | ICD-10-CM | POA: Diagnosis not present

## 2019-01-31 DIAGNOSIS — Z6841 Body Mass Index (BMI) 40.0 and over, adult: Secondary | ICD-10-CM | POA: Diagnosis not present

## 2019-01-31 DIAGNOSIS — K319 Disease of stomach and duodenum, unspecified: Secondary | ICD-10-CM | POA: Diagnosis not present

## 2019-01-31 DIAGNOSIS — I10 Essential (primary) hypertension: Secondary | ICD-10-CM | POA: Diagnosis not present

## 2019-02-01 ENCOUNTER — Ambulatory Visit (INDEPENDENT_AMBULATORY_CARE_PROVIDER_SITE_OTHER): Payer: Medicare Other | Admitting: Family

## 2019-02-01 ENCOUNTER — Encounter: Payer: Self-pay | Admitting: Family

## 2019-02-01 ENCOUNTER — Ambulatory Visit: Payer: Medicare Other | Admitting: Family

## 2019-02-01 DIAGNOSIS — I10 Essential (primary) hypertension: Secondary | ICD-10-CM

## 2019-02-01 DIAGNOSIS — I872 Venous insufficiency (chronic) (peripheral): Secondary | ICD-10-CM

## 2019-02-01 DIAGNOSIS — E669 Obesity, unspecified: Secondary | ICD-10-CM | POA: Diagnosis not present

## 2019-02-01 DIAGNOSIS — G629 Polyneuropathy, unspecified: Secondary | ICD-10-CM

## 2019-02-01 DIAGNOSIS — Z950 Presence of cardiac pacemaker: Secondary | ICD-10-CM | POA: Insufficient documentation

## 2019-02-01 NOTE — Progress Notes (Signed)
Virtual Visit via Video Note  I connected with@  on 02/01/19 at  8:30 AM EST by a video enabled telemedicine application and verified that I am speaking with the correct person using two identifiers.  Location patient: home Location provider:work Persons participating in the virtual visit: patient, provider  I discussed the limitations of evaluation and management by telemedicine and the availability of in person appointments. The patient expressed understanding and agreed to proceed.  Interactive audio and video telecommunications were attempted between this provider and patient, however failed, due to patient having technical difficulties or patient did not have access to video capability.  We continued and completed visit with audio only.   HPI: Appointment for cardiac clearance with Dr Darnell Level in 03/2019, planning duodenal switch. Labs done with Dr Darnell Level 01/23/19.  Pacemaker   Follows with Dr Clayborn Bigness, had a normal stress test per patient 2020. Given cardiac clearance 12/25/18   On 81mg  ASA.   No alcohol, drug use. Non smoking.   Walks 10- 15 minutes most days, no CP.   HTN- improved. 120/61,  127/59, HR 67, 131/76, HR 67, 134/67, HR 61. No CP.   Peripheral neuropathy- for 2 years, unchanged. Mostly in toes and in back of heel. Gabapentin helps. She also has chronic venous insufficiency.   Leg swelling improved since last visit. 'Back to its baseline.' No calf tenderness, sob. No wounds, increased heat.   Seen Dr Derrel Nip 01/16/19. Continues to take lasix , taking daily with KCl QOD. She had dc amlodipine and started elevating legs,  Working on getting supplies for lymphedema pump as prescribed by Dr Curley Spice 08/2018.  ROS: See pertinent positives and negatives per HPI.  Past Medical History:  Diagnosis Date  . Anxiety   . Constipation   . Elevated liver enzymes   . Hemorrhoids   . Herpes genitalis   . High cholesterol   . Hyperlipidemia   . Hypertension   . Hypothyroidism   .  Osteoarthritis   . Sleep apnea     Past Surgical History:  Procedure Laterality Date  . ABDOMINAL HYSTERECTOMY     total for fibroids no h/o abnormal pap  . bariatric sleeve  2015  . BREAST EXCISIONAL BIOPSY Left 1998  . carpal tunnel repair    . COLONOSCOPY WITH PROPOFOL N/A 02/11/2016   Procedure: COLONOSCOPY WITH PROPOFOL;  Surgeon: Jonathon Bellows, MD;  Location: [facility] ENDOSCOPY;  Service: Endoscopy;  Laterality: N/A;  . HEMORRHOID SURGERY      Family History  Problem Relation Age of Onset  . Breast cancer Sister 36       materal 1/2 sister  . Hypertension Sister   . Hypertension Mother   . Heart disease Father   . Hypertension Brother     SOCIAL HX: Former smoker   Current Outpatient Medications:  .  acyclovir (ZOVIRAX) 400 MG tablet, TAKE 1 TABLET BY MOUTH EVERY DAY, Disp: 30 tablet, Rfl: 5 .  aspirin 81 MG tablet, Take 81 mg by mouth daily., Disp: , Rfl:  .  azelastine (ASTELIN) 0.1 % nasal spray, USE 1 SPRAY EACH NOSTRIL TWICE A DAY AS NEEDED FOR ALLERGIES, Disp: , Rfl:  .  cholecalciferol (VITAMIN D) 1000 UNITS tablet, Take 400 Units by mouth daily. , Disp: , Rfl:  .  cyclobenzaprine (FLEXERIL) 10 MG tablet, Take 10 mg by mouth 2 (two) times daily at 10 AM and 5 PM., Disp: , Rfl:  .  fexofenadine (ALLEGRA) 180 MG tablet, Take 1 tablet (180 mg total)  by mouth daily., Disp: 30 tablet, Rfl: 3 .  fluticasone (FLONASE) 50 MCG/ACT nasal spray, Place 1 spray into both nostrils daily., Disp: 16 g, Rfl: 2 .  furosemide (LASIX) 20 MG tablet, Take by mouth., Disp: , Rfl:  .  gabapentin (NEURONTIN) 100 MG capsule, Take one tablet in the morning PO, take one tablet midday, and take two-three tablets PO at bedtime., Disp: 90 capsule, Rfl: 5 .  hydrocortisone (ANUSOL-HC) 25 MG suppository, Place 1 suppository (25 mg total) rectally 2 (two) times daily as needed for hemorrhoids or anal itching., Disp: 12 suppository, Rfl: 2 .  levothyroxine (SYNTHROID) 50 MCG tablet, TAKE 1 TABLET BY MOUTH  EVERY DAY, Disp: 30 tablet, Rfl: 5 .  losartan (COZAAR) 50 MG tablet, Take by mouth., Disp: , Rfl:  .  lubiprostone (AMITIZA) 24 MCG capsule, Take 1 tablet by mouth daily., Disp: , Rfl:  .  Multiple Vitamin (MULTI-VITAMIN) tablet, Take by mouth., Disp: , Rfl:  .  naphazoline-pheniramine (ALLERGY EYE) 0.025-0.3 % ophthalmic solution, Place 1 drop into both eyes 4 (four) times daily as needed for eye irritation or allergies., Disp: 15 mL, Rfl: 1 .  omeprazole (PRILOSEC) 20 MG capsule, TAKE 1 CAPSULE BY MOUTH EVERY DAY, Disp: 30 capsule, Rfl: 5 .  potassium chloride (KLOR-CON) 10 MEQ tablet, Take 1 tablet by mouth 3 (three) times a week., Disp: , Rfl:  .  rosuvastatin (CRESTOR) 40 MG tablet, TAKE 1 TABLET BY MOUTH EVERY DAY, Disp: 30 tablet, Rfl: 8 .  traZODone (DESYREL) 100 MG tablet, TAKE 1 TABLET BY MOUTH EVERYDAY AT BEDTIME, Disp: 90 tablet, Rfl: 16  EXAM:  VITALS per patient if applicable: There were no vitals filed for this visit. BP Readings from Last 3 Encounters:  11/23/18 130/70  10/05/18 138/82  09/05/18 (!) 141/79    GENERAL: alert, oriented, appears well and in no acute distress  HEENT: atraumatic, conjunttiva clear, no obvious abnormalities on inspection of external nose and ears  NECK: normal movements of the head and neck  LUNGS: on inspection no signs of respiratory distress, breathing rate appears normal, no obvious gross SOB, gasping or wheezing  CV: no obvious cyanosis  MS: moves all visible extremities without noticeable abnormality  PSYCH/NEURO: pleasant and cooperative, no obvious depression or anxiety, speech and thought processing grossly intact  ASSESSMENT AND PLAN:  Discussed the following assessment and plan:  Obesity, unspecified classification, unspecified obesity type, unspecified whether serious comorbidity present  Peripheral polyneuropathy  Pacemaker  Essential hypertension  Chronic venous insufficiency Problem List Items Addressed This  Visit      Cardiovascular and Mediastinum   Chronic venous insufficiency    Improved to baseline with discontinuation of amlodipine.  Unfortunately she has had issues in receiving her lymphedema pump.  We have a call out to Dr. Wende Bushy office to see how we can rectify this      Essential hypertension    At goal, continue current regimen        Nervous and Auditory   Peripheral neuropathy    Symptoms controlled with [email or web address] provided in regards to diabetes mellitus and neuropathy.  We are hopeful that duodenal switch will lower her A1c        Other   Obesity, unspecified    Based on my discussion and assessment of the patient today, she is moderate risk for surgery based on comorbidities. I have given her clearance for bariatric surgery from a primary care standpoint as resolution of her morbid obesity.  She is  done labs with Dr. Darnell Level. Dr Clayborn Bigness has independently cleared her surgery as well.  Advised her to stop the 81 mg aspirin 5 days prior to surgery and to resume a couple days thereafter following orders from Dr. Darnell Level.       Pacemaker      -we discussed possible serious and likely etiologies, options for evaluation and workup, limitations of telemedicine visit vs in person visit, treatment, treatment risks and precautions. Pt prefers to treat via telemedicine empirically rather then risking or undertaking an in person visit at this moment. Patient agrees to seek prompt in person care if worsening, new symptoms arise, or if is not improving with treatment.   I discussed the assessment and treatment plan with the patient. The patient was provided an opportunity to ask questions and all were answered. The patient agreed with the plan and demonstrated an understanding of the instructions.   The patient was advised to call back or seek an in-person evaluation if the symptoms worsen or if the condition fails to improve as anticipated.   [name], FNP  I spent  25 min non face to face w/ pt.

## 2019-02-01 NOTE — Assessment & Plan Note (Signed)
At goal, continue current regimen 

## 2019-02-01 NOTE — Assessment & Plan Note (Addendum)
Based on my discussion and assessment of the patient today, she is moderate risk for surgery based on comorbidities. I have given her clearance for bariatric surgery from a primary care standpoint as resolution of her morbid obesity.  She is done labs with Dr. Darnell Level. Dr Clayborn Bigness has independently cleared her surgery as well.  Advised her to stop the 81 mg aspirin 5 days prior to surgery and to resume a couple days thereafter following orders from Dr. Darnell Level.

## 2019-02-01 NOTE — Assessment & Plan Note (Signed)
Symptoms controlled with [email or web address] provided in regards to diabetes mellitus and neuropathy.  We are hopeful that duodenal switch will lower her A1c

## 2019-02-01 NOTE — Assessment & Plan Note (Signed)
Improved to baseline with discontinuation of amlodipine.  Unfortunately she has had issues in receiving her lymphedema pump.  We have a call out to Dr. Wende Bushy office to see how we can rectify this

## 2019-02-05 NOTE — Progress Notes (Signed)
I spoke with Lauretta Chester at [employer]. HE said that he was waiting on clinical documentation from Dr. Delana Meyer. He has documented some of what needs to be said to meet medicare guidelines but not all. This is why medicare has not approved it. He stated that if you wanted to addend note & put in this sentence that he could get lympha-press approved. "Patient has lymphedema with hyperpigmentation. She has been performing elevation, compression & exercise for at least 4 weeks."  That is what medicare is looking for to be said to meet criteria to approve. If you would be willing to do this we could fax notes to [phone]. He was unsure why Dr. Delana Meyer had not yet put in the appropriate documentation. He did not know if it was because of non-compliance on patient's part or if he was waiting for patient to follow-up next month?

## 2019-02-05 NOTE — Progress Notes (Signed)
LMTCB

## 2019-02-05 NOTE — Progress Notes (Signed)
LMTCB on nurse line.

## 2019-02-11 ENCOUNTER — Other Ambulatory Visit: Payer: Self-pay | Admitting: Family

## 2019-02-11 DIAGNOSIS — J3489 Other specified disorders of nose and nasal sinuses: Secondary | ICD-10-CM

## 2019-02-18 NOTE — Progress Notes (Signed)
@Patient  ID: Jennifer [name], female    DOB: 1950/11/06, 69 y.o.   MRN: [id number]  Chief Complaint  Patient presents with  . Follow-up    Pt states shes been okay since last ov. Wants to change pressure. SOB on exertion. Bariatic schedule potentially next month. Pt denies any cough, wheezing, fever, or chills    Referring provider: [name], FNP  HPI:  69 year old female former smoker followed in our office for mild obstructive sleep apnea  Past medical history: Obesity, hypertension, GERD Smoking history: Former smoker.  Quit 1993.  36-pack-year smoking history Maintenance: None Patient of Dr. Mortimer Fries  02/19/2019  - Visit   69 year old female former smoker followed in our office for mild obstructive sleep apnea.  Patient presenting to office today reporting that she is planning on having another bariatric surgery next month.  She scheduled the appointment today because she feels that she is having difficulty using her CPAP.  Her CPAP compliance report is excellent.  See compliance report listed below:  01/19/2019-02/17/2019-CPAP compliance report-30 at the last 30 days use, average usage 8 hours and 0 minutes, CPAP set pressure of 9, AHI 0.1  An order was placed to [facility] in October/2020 requesting that they change the set pressure back to 10.  This was not done.  Patient is also reporting difficulty using her CPAP due to a slow ramp up.  We will work to get this addressed.  Questionaires / Pulmonary Flowsheets:   Epworth:  Results of the Epworth flowsheet 03/01/2018  Sitting and reading 0  Watching TV 0  Sitting, inactive in a public place (e.g. a theatre or a meeting) 0  As a passenger in a car for an hour without a break 1  Lying down to rest in the afternoon when circumstances permit 0  Sitting and talking to someone 0  Sitting quietly after a lunch without alcohol 0  In a car, while stopped for a few minutes in traffic 0  Total score 1    Tests:    02/17/2016-sleep study-AHI 14.1, REM supine sleep 55.5, SaO2 low 77.8%, time spent below 90% oxygen saturation equal 34 minutes  08/08/2016-CPAP titration-optimal level of CPAP set pressure of 10   FENO:  No results found for: NITRICOXIDE  PFT: No flowsheet data found.  WALK:  No flowsheet data found.  Imaging: No results found.  Lab Results:  CBC    Component Value Date/Time   WBC 7.0 10/22/2018 0854   RBC 5.14 (H) 10/22/2018 0854   HGB 12.8 10/22/2018 0854   HGB 11.2 (L) 06/26/2013 0409   HCT 41.5 10/22/2018 0854   HCT 35.9 06/26/2013 0409   PLT 169.0 10/22/2018 0854   PLT 184 06/26/2013 0409   MCV 80.7 10/22/2018 0854   MCV 84 06/26/2013 0409   MCH 29.0 03/07/2018 1703   MCHC 30.9 10/22/2018 0854   RDW 15.1 10/22/2018 0854   RDW 14.5 06/26/2013 0409   LYMPHSABS 3.6 10/22/2018 0854   LYMPHSABS 1.5 06/26/2013 0409   MONOABS 0.6 10/22/2018 0854   MONOABS 0.7 06/26/2013 0409   EOSABS 0.1 10/22/2018 0854   EOSABS 0.0 06/26/2013 0409   BASOSABS 0.1 10/22/2018 0854   BASOSABS 0.0 06/26/2013 0409    BMET    Component Value Date/Time   NA 142 10/22/2018 0854   NA 139 06/26/2013 0409   K 4.1 10/22/2018 0854   K 4.3 06/26/2013 0409   CL 106 10/22/2018 0854   CL 106 06/26/2013 0409  CO2 30 10/22/2018 0854   CO2 28 06/26/2013 0409   GLUCOSE 95 10/22/2018 0854   GLUCOSE 108 (H) 06/26/2013 0409   BUN 11 10/22/2018 0854   BUN 12 06/26/2013 0409   CREATININE 0.80 10/22/2018 0854   CREATININE 0.72 06/26/2013 0409   CALCIUM 9.6 10/22/2018 0854   CALCIUM 8.5 06/26/2013 0409   GFRNONAA 58 (L) 03/07/2018 1703   GFRNONAA >60 06/26/2013 0409   GFRAA >60 03/07/2018 1703   GFRAA >60 06/26/2013 0409    BNP No results found for: BNP  ProBNP    Component Value Date/Time   PROBNP 10.0 05/07/2018 1351    Specialty Problems      Pulmonary Problems   Allergic rhinitis   OSA (obstructive sleep apnea)    02/17/2016-sleep study-AHI 14.1, REM supine sleep 55.5,  SaO2 low 77.8%, time spent below 90% oxygen saturation equal 34 minutes  08/08/2016-CPAP titration-optimal level of CPAP set pressure of 10         Allergies  Allergen Reactions  . Celecoxib Hives  . Pollen Extract   . Nasacort [Triamcinolone] Other (See Comments)    Nasal - Nose Bleeds  . Vicodin [Hydrocodone-Acetaminophen] Nausea Only    Immunization History  Administered Date(s) Administered  . Hep A / Hep B 05/24/2013, 11/21/2013  . Influenza Split 10/21/2013  . Influenza, High Dose Seasonal PF 10/24/2016, 09/06/2018  . Influenza,inj,Quad PF,6+ Mos 11/08/2013, 10/28/2014  . Influenza-Unspecified 12/05/2011, 10/28/2015, 10/24/2016, 09/06/2017  . Pneumococcal Conjugate-13 12/05/2014, 04/06/2016  . Pneumococcal Polysaccharide-23 06/05/2017  . Tdap 02/05/2014, 08/07/2018  . Zoster 01/09/2012, 12/05/2014  . Zoster Recombinat (Shingrix) 12/25/2018    Past Medical History:  Diagnosis Date  . Anxiety   . Constipation   . Elevated liver enzymes   . Hemorrhoids   . Herpes genitalis   . High cholesterol   . Hyperlipidemia   . Hypertension   . Hypothyroidism   . Osteoarthritis   . Sleep apnea     Tobacco History: Social History   Tobacco Use  Smoking Status Former Smoker  . Packs/day: 1.50  . Years: 24.00  . Pack years: 36.00  . Types: Cigarettes  . Quit date: 17  . Years since quitting: 28.1  Smokeless Tobacco Never Used  Tobacco Comment   quit 1995.    Counseling given: Yes Comment: quit 1995.   Continue to not smoke  Outpatient Encounter Medications as of 02/19/2019  Medication Sig  . acyclovir (ZOVIRAX) 400 MG tablet TAKE 1 TABLET BY MOUTH EVERY DAY  . aspirin 81 MG tablet Take 81 mg by mouth daily.  [name] Kitchen azelastine (ASTELIN) 0.1 % nasal spray USE 1 SPRAY EACH NOSTRIL TWICE A DAY AS NEEDED FOR ALLERGIES  . cholecalciferol (VITAMIN D) 1000 UNITS tablet Take 400 Units by mouth daily.   . fexofenadine (ALLEGRA) 180 MG tablet Take 1 tablet (180 mg total) by  mouth daily.  . fluticasone (FLONASE) 50 MCG/ACT nasal spray PLACE 1 SPRAY INTO BOTH NOSTRILS DAILY.  . furosemide (LASIX) 20 MG tablet Take by mouth.  . gabapentin (NEURONTIN) 100 MG capsule Take one tablet in the morning PO, take one tablet midday, and take two-three tablets PO at bedtime.  . hydrocortisone (ANUSOL-HC) 25 MG suppository Place 1 suppository (25 mg total) rectally 2 (two) times daily as needed for hemorrhoids or anal itching.  . levothyroxine (SYNTHROID) 50 MCG tablet TAKE 1 TABLET BY MOUTH EVERY DAY  . losartan (COZAAR) 50 MG tablet Take by mouth.  . Multiple Vitamin (MULTI-VITAMIN) tablet Take by  mouth.  . naphazoline-pheniramine (ALLERGY EYE) 0.025-0.3 % ophthalmic solution Place 1 drop into both eyes 4 (four) times daily as needed for eye irritation or allergies.  [name] Kitchen omeprazole (PRILOSEC) 20 MG capsule TAKE 1 CAPSULE BY MOUTH EVERY DAY  . potassium chloride (KLOR-CON) 10 MEQ tablet Take 1 tablet by mouth 3 (three) times a week.  . rosuvastatin (CRESTOR) 40 MG tablet TAKE 1 TABLET BY MOUTH EVERY DAY  . traZODone (DESYREL) 100 MG tablet TAKE 1 TABLET BY MOUTH EVERYDAY AT BEDTIME  . [DISCONTINUED] cyclobenzaprine (FLEXERIL) 10 MG tablet Take 10 mg by mouth 2 (two) times daily at 10 AM and 5 PM.  . [DISCONTINUED] lubiprostone (AMITIZA) 24 MCG capsule Take 1 tablet by mouth daily.   No facility-administered encounter medications on file as of 02/19/2019.     Review of Systems  Review of Systems  Constitutional: Negative for activity change, fatigue and fever.  HENT: Negative for sinus pressure, sinus pain and sore throat.   Respiratory: Negative for cough, shortness of breath and wheezing.   Cardiovascular: Negative for chest pain and palpitations.  Gastrointestinal: Negative for diarrhea, nausea and vomiting.  Musculoskeletal: Negative for arthralgias.  Neurological: Negative for dizziness.  Psychiatric/Behavioral: Negative for sleep disturbance. The patient is not  nervous/anxious.      Physical Exam  BP 132/72 (BP Location: Left Arm, Patient Position: Sitting, Cuff Size: Large)   Pulse 77   Temp (!) 97.3 F (36.3 C) (Temporal)   Ht 5\' 1"  (1.549 m)   Wt 230 lb 6.4 oz (104.5 kg)   SpO2 98% Comment: on ra  BMI 43.53 kg/m   Wt Readings from Last 5 Encounters:  02/19/19 230 lb 6.4 oz (104.5 kg)  02/01/19 216 lb (98 kg)  01/16/19 216 lb (98 kg)  11/23/18 216 lb 6.4 oz (98.2 kg)  10/05/18 208 lb (94.3 kg)    BMI Readings from Last 5 Encounters:  02/19/19 43.53 kg/m  02/01/19 39.51 kg/m  01/16/19 39.51 kg/m  11/23/18 39.58 kg/m  10/05/18 39.30 kg/m     Physical Exam Vitals and nursing note reviewed.  Constitutional:      General: She is not in acute distress.    Appearance: Normal appearance. She is obese.  HENT:     Head: Normocephalic and atraumatic.     Right Ear: Tympanic membrane, ear canal and external ear normal. There is no impacted cerumen.     Left Ear: Tympanic membrane, ear canal and external ear normal. There is no impacted cerumen.     Nose: Nose normal. No congestion.     Mouth/Throat:     Mouth: Mucous membranes are moist.     Pharynx: Oropharynx is clear.  Eyes:     Pupils: Pupils are equal, round, and reactive to light.  Cardiovascular:     Rate and Rhythm: Normal rate and regular rhythm.     Pulses: Normal pulses.     Heart sounds: Normal heart sounds. No murmur.  Pulmonary:     Effort: Pulmonary effort is normal. No respiratory distress.     Breath sounds: Normal breath sounds. No decreased air movement. No decreased breath sounds, wheezing or rales.  Musculoskeletal:     Cervical back: Normal range of motion.  Skin:    General: Skin is warm and dry.     Capillary Refill: Capillary refill takes less than 2 seconds.  Neurological:     General: No focal deficit present.     Mental Status: She is alert and oriented  to person, place, and time. Mental status is at baseline.     Gait: Gait normal.    Psychiatric:        Mood and Affect: Mood normal.        Behavior: Behavior normal.        Thought Content: Thought content normal.        Judgment: Judgment normal.       Assessment & Plan:   OSA (obstructive sleep apnea) Plan: Increase CPAP set pressure 10 Will contact [employer] [facility] and see if ramp up.  Can be removed completely or lessened Follow-up with our office in 3 to 6 months to establish with sleep MD in [facility] clinic  History of bariatric surgery Plan: Continue follow-up with bariatric team   Obesity, unspecified Plan: Continue to work with primary care and bariatrics    Return in about 6 months (around 08/19/2019), or if symptoms worsen or fail to improve, for Follow up with Dr. Halford Chessman.   [name], NP 02/19/2019   This appointment required 32 minutes of patient care (this includes precharting, chart review, review of results, face-to-face care, etc.).

## 2019-02-19 ENCOUNTER — Other Ambulatory Visit: Payer: Self-pay

## 2019-02-19 ENCOUNTER — Telehealth: Payer: Self-pay | Admitting: Pulmonary Disease

## 2019-02-19 ENCOUNTER — Ambulatory Visit (INDEPENDENT_AMBULATORY_CARE_PROVIDER_SITE_OTHER): Payer: Medicare Other | Admitting: Pulmonary Disease

## 2019-02-19 ENCOUNTER — Encounter: Payer: Self-pay | Admitting: Pulmonary Disease

## 2019-02-19 VITALS — BP 132/72 | HR 77 | Temp 97.3°F | Ht 61.0 in | Wt 230.4 lb

## 2019-02-19 DIAGNOSIS — Z9884 Bariatric surgery status: Secondary | ICD-10-CM | POA: Diagnosis not present

## 2019-02-19 DIAGNOSIS — E669 Obesity, unspecified: Secondary | ICD-10-CM

## 2019-02-19 DIAGNOSIS — G4733 Obstructive sleep apnea (adult) (pediatric): Secondary | ICD-10-CM

## 2019-02-19 NOTE — Assessment & Plan Note (Signed)
Plan: Continue to work with primary care and bariatrics

## 2019-02-19 NOTE — Assessment & Plan Note (Signed)
Plan: Continue follow-up with bariatric team

## 2019-02-19 NOTE — Assessment & Plan Note (Signed)
Plan: Increase CPAP set pressure 10 Will contact [employer] [facility] and see if ramp up.  Can be removed completely or lessened Follow-up with our office in 3 to 6 months to establish with sleep MD in [facility] clinic

## 2019-02-19 NOTE — Progress Notes (Signed)
Reviewed and agree with assessment/plan.   Cellie Dardis, MD [facility] 01/13/2016, 12:24 PM Pager:  [phone]  

## 2019-02-19 NOTE — Telephone Encounter (Signed)
Pt in office for visit with Carl Best, NP. Aaron Edelman requested that ramp setting be turned off and pressure changed to 10cm.  Order was placed on 11/12/2018 to do so, however settings were not adjusted and ramp was not disconnected on [employer] end.  I have contacted [employer] and requested that this be done. I was advised that this had been handled during our conversation and a new order was not needed as order from 11/12/2018 could be used.  Nothing further is needed.

## 2019-02-19 NOTE — Patient Instructions (Addendum)
You were seen today by [name], NP  for:   1. OSA (obstructive sleep apnea)  D[facility] [employer] We will place an order to decrease your ramp up speed Ramp up pressure at a minimum should be 8 We will change set pressure back to 10  We recommend that you continue using your CPAP daily >>>Keep up the hard work using your device >>> Goal should be wearing this for the entire night that you are sleeping, at least 4 to 6 hours  Remember:  . Do not drive or operate heavy machinery if tired or drowsy.  . Please notify the supply company and office if you are unable to use your device regularly due to missing supplies or machine being broken.  . Work on maintaining a healthy weight and following your recommended nutrition plan  . Maintain proper daily exercise and movement  . Maintaining proper use of your device can also help improve management of other chronic illnesses such as: Blood pressure, blood sugars, and weight management.   BiPAP/ CPAP Cleaning:  >>>Clean weekly, with Dawn soap, and bottle brush.  Set up to air dry. >>> Wipe mask out daily with wet wipe or towelette   Follow Up:    Return in about 6 months (around 08/19/2019), or if symptoms worsen or fail to improve, for Follow up with Dr. Halford Chessman.   Please do your part to reduce the spread of COVID-19:      Reduce your risk of any infection  and COVID19 by using the similar precautions used for avoiding the common cold or flu:  [name] Kitchen Wash your hands often with soap and warm water for at least 20 seconds.  If soap and water are not readily available, use an alcohol-based hand sanitizer with at least 60% alcohol.  . If coughing or sneezing, cover your mouth and nose by coughing or sneezing into the elbow areas of your shirt or coat, into a tissue or into your sleeve (not your hands). Langley Gauss A MASK when in public  . Avoid shaking hands with others and consider head nods or verbal greetings only. . Avoid touching your eyes, nose,  or mouth with unwashed hands.  . Avoid close contact with people who are sick. . Avoid places or events with large numbers of people in one location, like concerts or sporting events. . If you have some symptoms but not all symptoms, continue to monitor at home and seek medical attention if your symptoms worsen. . If you are having a medical emergency, call 911.   [place] / e-Visit: [email or web address]         [facility]: [facility]: [id number]                   [facility]: [id number]     It is flu season:   >>> Best ways to protect herself from the flu: Receive the yearly flu vaccine, practice good hand hygiene washing with soap and also using hand sanitizer when available, eat a nutritious meals, get adequate rest, hydrate appropriately   Please contact the office if your symptoms worsen or you have concerns that you are not improving.   Thank you for choosing [facility] for your healthcare, and for allowing [name] to partner with you on your healthcare journey. I am thankful to be able to provide care to you today.   [name]  FNP-C

## 2019-02-20 ENCOUNTER — Telehealth: Payer: Self-pay | Admitting: Family

## 2019-02-20 ENCOUNTER — Encounter: Payer: Self-pay | Admitting: Family

## 2019-02-20 ENCOUNTER — Ambulatory Visit (INDEPENDENT_AMBULATORY_CARE_PROVIDER_SITE_OTHER): Payer: Medicare Other | Admitting: Family

## 2019-02-20 DIAGNOSIS — E669 Obesity, unspecified: Secondary | ICD-10-CM

## 2019-02-20 DIAGNOSIS — R7301 Impaired fasting glucose: Secondary | ICD-10-CM | POA: Diagnosis not present

## 2019-02-20 DIAGNOSIS — Z1211 Encounter for screening for malignant neoplasm of colon: Secondary | ICD-10-CM

## 2019-02-20 NOTE — Telephone Encounter (Signed)
I have called & patient has appointment for a1c scheduled for tomorrow. Patient wants to wait to get shingrix until after she has covid vaccine done. She will call back afterwards so we can schedule second shingrix accordingly.   Dr.Bruce's nurse was sent message regarding scheduling colonoscopy. They are supposed to call us back to advise.

## 2019-02-20 NOTE — Telephone Encounter (Signed)
Call Dr Kreg Shropshire [phone]  Ask if patient should have colonoscopy BEFORE or AFTER her bariatric surgery, duodenal switch.   please call pt and sch shingrex vaccine and a1c lab. getting off with her now. I advised since she waiting on covid vaccine that she may not have shingrex within 14 days of either covid vaccine ( dose one or two). She would need to cancel shingrex vaccine depending on how covid vaccine lands. sch 3 month f/u.

## 2019-02-20 NOTE — Assessment & Plan Note (Addendum)
Planning a duodenal switch in March of this year with Dr. Darnell Level.  Advised she is due for colonoscopy and we are currently checking with Dr. Synthia Innocent office to see if this is advisable prior to or after her surgery. Of note, also advised patient to have Covid vaccine ( she is currently on wait list) based on our discussion and her risk for severe disease.  Advised that she would need to wait 14 days between having Covid vaccine and any other vaccines including Shingrix which she is scheduled for.  Patient verbalized understanding of all

## 2019-02-20 NOTE — Progress Notes (Signed)
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 Would like to discuss diet. Has been limiting carbohydrate and sugar.  No blurry vision, polydipsia, polyuria. Never used glucometer. Following with nutritionist at Dr Synthia Innocent office. Slowly gaining weight, yesterday was 230lbs.  Labs with Dr Darnell Level 01/23/19.  LDL 70 Fasting blood glucose 111.  Mammogram scheduled Due for shingrex Interested in covid  History, background, results pertinent:  Former smoker  A/P/next steps: Problem List Items Addressed This Visit      Endocrine   Impaired fasting glucose - Primary    Fasting blood glucose done by Dr. Darnell Level does fall in prediabetic range which I explained to patient today.  We discussed dietary changes, low carbohydrate and low sugar.  She will continue to do the good work she is doing at home and following with a nutritionist.  Pending A1c      Relevant Orders   Hemoglobin A1c     Other   Obesity, unspecified    Planning a duodenal switch in March of this year with Dr. Darnell Level.  Advised she is due for colonoscopy and we are currently checking with Dr. Synthia Innocent office to see if this is advisable prior to or after her surgery. Of note, also advised patient to have Covid vaccine ( she is currently on wait list) based on our discussion and her risk for severe disease.  Advised that she would need to wait 14 days between having Covid vaccine and any other vaccines including Shingrix which she is scheduled for.  Patient verbalized understanding of all         I spent 25 min  discussing plan of care over the phone.

## 2019-02-20 NOTE — Assessment & Plan Note (Signed)
Fasting blood glucose done by Dr. Darnell Level does fall in prediabetic range which I explained to patient today.  We discussed dietary changes, low carbohydrate and low sugar.  She will continue to do the good work she is doing at home and following with a nutritionist.  Pending A1c

## 2019-02-20 NOTE — Telephone Encounter (Signed)
Pt called back needing some clarification regarding having the A1C done she was told at Dr Darnell Level office it should be done every 2-3 month? Please advise and Thank you!  Call pt @ [phone].

## 2019-02-21 ENCOUNTER — Ambulatory Visit: Payer: Medicare Other

## 2019-02-21 ENCOUNTER — Other Ambulatory Visit: Payer: Medicare Other

## 2019-02-22 ENCOUNTER — Telehealth: Payer: Self-pay | Admitting: Family

## 2019-02-22 NOTE — Addendum Note (Signed)
Addended by: [name] on: 02/22/2019 12:47 PM   Modules accepted: Orders

## 2019-02-22 NOTE — Telephone Encounter (Signed)
I called patient to let her know referral for colonoscopy was placed. Also Dr. Synthia Innocent office stated that it made no difference if she had coloscopy before or after surgery. Patient just prefers before.

## 2019-02-22 NOTE — Telephone Encounter (Signed)
Pt is inquiring about colonoscopy that her and PCP discussed. She wants to know if she needs a referral?

## 2019-02-22 NOTE — Telephone Encounter (Signed)
noted 

## 2019-02-22 NOTE — Telephone Encounter (Signed)
Call pt Colonoscopy ordered and GI office will contact her HOWEVER I wasn't clear, did dr bruce's advise on whether can be done before as well as after duodenal switch planned in march?

## 2019-02-25 ENCOUNTER — Telehealth: Payer: Self-pay | Admitting: Family

## 2019-02-25 NOTE — Telephone Encounter (Signed)
Pt needs the GI referral to go to [facility] not [facility]

## 2019-02-27 ENCOUNTER — Telehealth: Payer: Self-pay | Admitting: Family

## 2019-02-27 DIAGNOSIS — Z6841 Body Mass Index (BMI) 40.0 and over, adult: Secondary | ICD-10-CM | POA: Diagnosis not present

## 2019-02-27 DIAGNOSIS — Z903 Acquired absence of stomach [part of]: Secondary | ICD-10-CM | POA: Diagnosis not present

## 2019-02-27 DIAGNOSIS — Z9884 Bariatric surgery status: Secondary | ICD-10-CM | POA: Diagnosis not present

## 2019-02-27 DIAGNOSIS — G4733 Obstructive sleep apnea (adult) (pediatric): Secondary | ICD-10-CM | POA: Diagnosis not present

## 2019-02-27 NOTE — Telephone Encounter (Signed)
  Does pt have an appt with [facility]?   Please confirm as appears we sent to [facility] as patient had originally requested; appears she was already established with [facility]      Per Dr Jonathon Bellows   Inform patient that since patient already established with [facility] and in fact had transferred care from me previously. It would be best that the patient continue care with [facility] and not transfer back.

## 2019-02-28 ENCOUNTER — Ambulatory Visit (INDEPENDENT_AMBULATORY_CARE_PROVIDER_SITE_OTHER): Payer: Medicare Other | Admitting: Vascular Surgery

## 2019-02-28 ENCOUNTER — Other Ambulatory Visit: Payer: Self-pay

## 2019-02-28 ENCOUNTER — Encounter (INDEPENDENT_AMBULATORY_CARE_PROVIDER_SITE_OTHER): Payer: Self-pay | Admitting: Vascular Surgery

## 2019-02-28 ENCOUNTER — Ambulatory Visit
Admission: RE | Admit: 2019-02-28 | Discharge: 2019-02-28 | Disposition: A | Discharge status: Home / Self Care | Payer: Medicare Other | Source: Ambulatory Visit | Attending: Family | Admitting: Family

## 2019-02-28 VITALS — BP 142/72 | HR 78 | Resp 16 | Wt 231.0 lb

## 2019-02-28 DIAGNOSIS — E782 Mixed hyperlipidemia: Secondary | ICD-10-CM

## 2019-02-28 DIAGNOSIS — Z1231 Encounter for screening mammogram for malignant neoplasm of breast: Secondary | ICD-10-CM | POA: Diagnosis not present

## 2019-02-28 DIAGNOSIS — I890 Lymphedema, not elsewhere classified: Secondary | ICD-10-CM | POA: Diagnosis not present

## 2019-02-28 DIAGNOSIS — I10 Essential (primary) hypertension: Secondary | ICD-10-CM | POA: Diagnosis not present

## 2019-02-28 DIAGNOSIS — K219 Gastro-esophageal reflux disease without esophagitis: Secondary | ICD-10-CM

## 2019-02-28 DIAGNOSIS — I872 Venous insufficiency (chronic) (peripheral): Secondary | ICD-10-CM | POA: Diagnosis not present

## 2019-02-28 NOTE — Telephone Encounter (Signed)
Shouldn't I refer this to Dr. Clayborn Bigness?

## 2019-02-28 NOTE — Telephone Encounter (Signed)
Her referral was sent to [facility] on 2/9. She has not been scheduled yet. Thanks [employer]

## 2019-02-28 NOTE — Telephone Encounter (Signed)
Patient is requesting a note to stop taking Asprin for her up coming surgery in March.

## 2019-02-28 NOTE — Progress Notes (Signed)
MRN : [id number]  Jennifer [name] is a 69 y.o. (1950-04-22) female who presents with chief complaint of No chief complaint on file. [name] Kitchen  History of Present Illness:   The patient returns to the office for followup evaluation regarding leg swelling.  The swelling has persisted and the pain associated with swelling continues. There have not been any interval development of a ulcerations or wounds.  Since the previous visit the patient has been wearing graduated compression stockings and has noted little if any improvement in the lymphedema. The patient has been using compression routinely morning until night.  The patient also states elevation during the day and exercise is being done too.  By the patient's history she has done exercise with elevation and compression for 4 weeks  No outpatient medications have been marked as taking for the 02/28/19 encounter (Appointment) with Delana Meyer, Dolores Lory, MD.    Past Medical History:  Diagnosis Date  . Anxiety   . Constipation   . Elevated liver enzymes   . Hemorrhoids   . Herpes genitalis   . High cholesterol   . Hyperlipidemia   . Hypertension   . Hypothyroidism   . Osteoarthritis   . Sleep apnea     Past Surgical History:  Procedure Laterality Date  . ABDOMINAL HYSTERECTOMY     total for fibroids no h/o abnormal pap  . bariatric sleeve  2015  . BREAST EXCISIONAL BIOPSY Left 1998  . carpal tunnel repair    . COLONOSCOPY WITH PROPOFOL N/A 02/11/2016   Procedure: COLONOSCOPY WITH PROPOFOL;  Surgeon: Jonathon Bellows, MD;  Location: [facility];  Service: Endoscopy;  Laterality: N/A;  . HEMORRHOID SURGERY      Social History Social History   Tobacco Use  . Smoking status: Former Smoker    Packs/day: 1.50    Years: 24.00    Pack years: 36.00    Types: Cigarettes    Quit date: 1993    Years since quitting: 28.1  . Smokeless tobacco: Never Used  . Tobacco comment: quit 1995.   Substance Use Topics  . Alcohol use: Yes   Alcohol/week: 14.0 standard drinks    Types: 14 Glasses of wine per week    Comment: occ  . Drug use: No    Family History Family History  Problem Relation Age of Onset  . Breast cancer Sister 45       materal 1/2 sister  . Hypertension Sister   . Hypertension Mother   . Heart disease Father   . Hypertension Brother     Allergies  Allergen Reactions  . Celecoxib Hives  . Pollen Extract   . Nasacort [Triamcinolone] Other (See Comments)    Nasal - Nose Bleeds  . Vicodin [Hydrocodone-Acetaminophen] Nausea Only     REVIEW OF SYSTEMS (Negative unless checked)  Constitutional: [] Weight loss  [] Fever  [] Chills Cardiac: [] Chest pain   [] Chest pressure   [] Palpitations   [] Shortness of breath when laying flat   [] Shortness of breath with exertion. Vascular:  [] Pain in legs with walking   [x] Pain in legs at rest  [] History of DVT   [] Phlebitis   [x] Swelling in legs   [] Varicose veins   [] Non-healing ulcers Pulmonary:   [] Uses home oxygen   [] Productive cough   [] Hemoptysis   [] Wheeze  [] COPD   [] Asthma Neurologic:  [] Dizziness   [] Seizures   [] History of stroke   [] History of TIA  [] Aphasia   [] Vissual changes   [] Weakness or numbness in arm   []   Weakness or numbness in leg Musculoskeletal:   [] Joint swelling   [] Joint pain   [] Low back pain Hematologic:  [] Easy bruising  [] Easy bleeding   [] Hypercoagulable state   [] Anemic Gastrointestinal:  [] Diarrhea   [] Vomiting  [x] Gastroesophageal reflux/heartburn   [] Difficulty swallowing. Genitourinary:  [] Chronic kidney disease   [] Difficult urination  [] Frequent urination   [] Blood in urine Skin:  [] Rashes   [] Ulcers  Psychological:  [] History of anxiety   []  History of major depression.  Physical Examination  There were no vitals filed for this visit. There is no height or weight on file to calculate BMI. Gen: WD/WN, NAD Head: /AT, No temporalis wasting.  Ear/Nose/Throat: Hearing grossly intact, nares w/o erythema or drainage Eyes:  PER, EOMI, sclera nonicteric.  Neck: Supple, no large masses.   Pulmonary:  Good air movement, no audible wheezing bilaterally, no use of accessory muscles.  Cardiac: RRR, no JVD Vascular: scattered varicosities present bilaterally.  Moderate venous stasis changes to the skin of the  legs bilaterally associated with hyperpigmentation and hyperkeritosis.  2+ soft pitting edema.   Vessel Right Left  PT Palpable Palpable  DP Palpable Palpable  Gastrointestinal: Non-distended. No guarding/no peritoneal signs.  Musculoskeletal: M/S 5/5 throughout.  No deformity or atrophy.  Neurologic: CN 2-12 intact. Symmetrical.  Speech is fluent. Motor exam as listed above. Psychiatric: Judgment intact, Mood & affect appropriate for pt's clinical situation. Dermatologic: Noulcers noted, hyperpigmentation and hyperkeritosis.  No changes consistent with cellulitis.  CBC Lab Results  Component Value Date   WBC 7.0 10/22/2018   HGB 12.8 10/22/2018   HCT 41.5 10/22/2018   MCV 80.7 10/22/2018   PLT 169.0 10/22/2018    BMET    Component Value Date/Time   NA 142 10/22/2018 0854   NA 139 06/26/2013 0409   K 4.1 10/22/2018 0854   K 4.3 06/26/2013 0409   CL 106 10/22/2018 0854   CL 106 06/26/2013 0409   CO2 30 10/22/2018 0854   CO2 28 06/26/2013 0409   GLUCOSE 95 10/22/2018 0854   GLUCOSE 108 (H) 06/26/2013 0409   BUN 11 10/22/2018 0854   BUN 12 06/26/2013 0409   CREATININE 0.80 10/22/2018 0854   CREATININE 0.72 06/26/2013 0409   CALCIUM 9.6 10/22/2018 0854   CALCIUM 8.5 06/26/2013 0409   GFRNONAA 58 (L) 03/07/2018 1703   GFRNONAA >60 06/26/2013 0409   GFRAA >60 03/07/2018 1703   GFRAA >60 06/26/2013 0409   CrCl cannot be calculated (Patient's most recent lab result is older than the maximum 21 days allowed.).  COAG No results found for: INR, PROTIME  Radiology No results found.   Assessment/Plan 1. Chronic venous insufficiency Recommend:  No surgery or intervention at this point in  time.    She has done exercise with elevation and compression for 4 weeks.  She has poorly controlled edema.  She has changes to the skin of the  legs bilaterally associated with hyperpigmentation and hyperkeritosis  I have reviewed my previous discussion with the patient regarding swelling and why it causes symptoms.  Patient will continue wearing graduated compression stockings class 1 (20-30 mmHg) on a daily basis. The patient will  beginning wearing the stockings first thing in the morning and removing them in the evening. The patient is instructed specifically not to sleep in the stockings.    In addition, behavioral modification including several periods of elevation of the lower extremities during the day will be continued.  This was reviewed with the patient during the initial  visit.  The patient will also continue routine exercise, especially walking on a daily basis as was discussed during the initial visit.    Despite conservative treatments including graduated compression therapy class 1 and behavioral modification including exercise and elevation the patient  has not obtained adequate control of the lymphedema.  The patient still has stage 3 lymphedema and therefore, I believe that a lymph pump should be added to improve the control of the patient's lymphedema.  Additionally, a lymph pump is warranted because it will reduce the risk of cellulitis and ulceration in the future.  Patient should follow-up in six months    2. Lymphedema Recommend:  No surgery or intervention at this point in time.    She has done exercise with elevation and compression for 4 weeks.  She has poorly controlled edema.  She has changes to the skin of the  legs bilaterally associated with hyperpigmentation and hyperkeritosis  I have reviewed my previous discussion with the patient regarding swelling and why it causes symptoms.  Patient will continue wearing graduated compression stockings class 1 (20-30  mmHg) on a daily basis. The patient will  beginning wearing the stockings first thing in the morning and removing them in the evening. The patient is instructed specifically not to sleep in the stockings.    In addition, behavioral modification including several periods of elevation of the lower extremities during the day will be continued.  This was reviewed with the patient during the initial visit.  The patient will also continue routine exercise, especially walking on a daily basis as was discussed during the initial visit.    Despite conservative treatments including graduated compression therapy class 1 and behavioral modification including exercise and elevation the patient  has not obtained adequate control of the lymphedema.  The patient still has stage 3 lymphedema and therefore, I believe that a lymph pump should be added to improve the control of the patient's lymphedema.  Additionally, a lymph pump is warranted because it will reduce the risk of cellulitis and ulceration in the future.  Patient should follow-up in six months    3. Essential hypertension Continue antihypertensive medications as already ordered, these medications have been reviewed and there are no changes at this time.   4. Gastroesophageal reflux disease, unspecified whether esophagitis present Continue PPI as already ordered, this medication has been reviewed and there are no changes at this time.  Avoidence of caffeine and alcohol  Moderate elevation of the head of the bed   5. Mixed hyperlipidemia Continue statin as ordered and reviewed, no changes at this time    Hortencia Pilar, MD  02/28/2019 10:56 AM

## 2019-03-01 NOTE — Telephone Encounter (Signed)
Yes please Advise pt to call [facility] office

## 2019-03-01 NOTE — Telephone Encounter (Signed)
LM per DPR that patient should really refer to cardiology for note. I asked if she had any further questions to please call back our office.

## 2019-03-05 DIAGNOSIS — K219 Gastro-esophageal reflux disease without esophagitis: Secondary | ICD-10-CM | POA: Diagnosis not present

## 2019-03-05 DIAGNOSIS — K648 Other hemorrhoids: Secondary | ICD-10-CM | POA: Diagnosis not present

## 2019-03-05 DIAGNOSIS — K5909 Other constipation: Secondary | ICD-10-CM | POA: Diagnosis not present

## 2019-03-05 DIAGNOSIS — R1312 Dysphagia, oropharyngeal phase: Secondary | ICD-10-CM | POA: Diagnosis not present

## 2019-03-05 DIAGNOSIS — Z8719 Personal history of other diseases of the digestive system: Secondary | ICD-10-CM | POA: Diagnosis not present

## 2019-03-05 NOTE — Progress Notes (Signed)
[name], FNP   Chief Complaint  Patient presents with  . Vaginal Itching    irritation, no discharge or odor x 1 month    HPI:      [name] is a 69 y.o. No obstetric history on file. who LMP was No LMP recorded. Patient has had a hysterectomy., presents today for vaginal itching at the vaginal opening, no increased d/c or odor. Sx for the past month. Treated with monistat-7 over a week ago with a little improvement. Pt uses dove sensitive skin soap, no dryer sheets, but is using scented vaginal wipes. No recent abx use. Had similar sx 3/20 that was more chemical derm. No sex activity.   Past Medical History:  Diagnosis Date  . Anxiety   . Constipation   . Elevated liver enzymes   . Hemorrhoids   . Herpes genitalis   . High cholesterol   . Hyperlipidemia   . Hypertension   . Hypothyroidism   . Osteoarthritis   . Sleep apnea     Past Surgical History:  Procedure Laterality Date  . ABDOMINAL HYSTERECTOMY     total for fibroids no h/o abnormal pap  . bariatric sleeve  2015  . BREAST EXCISIONAL BIOPSY Left 1998  . carpal tunnel repair    . COLONOSCOPY WITH PROPOFOL N/A 02/11/2016   Procedure: COLONOSCOPY WITH PROPOFOL;  Surgeon: Jonathon Bellows, MD;  Location: [facility];  Service: Endoscopy;  Laterality: N/A;  . HEMORRHOID SURGERY      Family History  Problem Relation Age of Onset  . Breast cancer Sister 61       materal 1/2 sister  . Hypertension Sister   . Hypertension Mother   . Heart disease Father   . Hypertension Brother     Social History   Socioeconomic History  . Marital status: Married    Spouse name: Not on file  . Number of children: Not on file  . Years of education: Not on file  . Highest education level: Not on file  Occupational History  . Not on file  Tobacco Use  . Smoking status: Former Smoker    Packs/day: 1.50    Years: 24.00    Pack years: 36.00    Types: Cigarettes    Quit date: 1993    Years since  quitting: 28.1  . Smokeless tobacco: Never Used  . Tobacco comment: quit 1995.   Substance and Sexual Activity  . Alcohol use: Yes    Alcohol/week: 14.0 standard drinks    Types: 14 Glasses of wine per week    Comment: occ  . Drug use: No  . Sexual activity: Not Currently    Birth control/protection: Surgical    Comment: Hysterectomy  Other Topics Concern  . Not on file  Social History Narrative   Lives in [place].    Married.    Retired 2015, Interior and spatial designer.    One son; granddaughter.    Social Determinants of Health   Financial Resource Strain:   . Difficulty of Paying Living Expenses: Not on file  Food Insecurity:   . Worried About Charity fundraiser in the Last Year: Not on file  . Ran Out of Food in the Last Year: Not on file  Transportation Needs:   . Lack of Transportation (Medical): Not on file  . Lack of Transportation (Non-Medical): Not on file  Physical Activity: Unknown  . Days of Exercise per Week: 0 days  . Minutes  of Exercise per Session: Not on file  Stress: No Stress Concern Present  . Feeling of Stress : Not at all  Social Connections:   . Frequency of Communication with Friends and Family: Not on file  . Frequency of Social Gatherings with Friends and Family: Not on file  . Attends Religious Services: Not on file  . Active Member of Clubs or Organizations: Not on file  . Attends Archivist Meetings: Not on file  . Marital Status: Not on file  Intimate Partner Violence:   . Fear of Current or Ex-Partner: Not on file  . Emotionally Abused: Not on file  . Physically Abused: Not on file  . Sexually Abused: Not on file    Outpatient Medications Prior to Visit  Medication Sig Dispense Refill  . acyclovir (ZOVIRAX) 400 MG tablet TAKE 1 TABLET BY MOUTH EVERY DAY 30 tablet 5  . aspirin 81 MG tablet Take 81 mg by mouth daily.    [name] Kitchen azelastine (ASTELIN) 0.1 % nasal spray USE 1 SPRAY EACH NOSTRIL TWICE A DAY AS NEEDED FOR ALLERGIES    .  cholecalciferol (VITAMIN D) 1000 UNITS tablet Take 400 Units by mouth daily.     . fexofenadine (ALLEGRA) 180 MG tablet Take 1 tablet (180 mg total) by mouth daily. 30 tablet 3  . fluticasone (FLONASE) 50 MCG/ACT nasal spray PLACE 1 SPRAY INTO BOTH NOSTRILS DAILY. 16 mL 2  . furosemide (LASIX) 20 MG tablet Take by mouth.    . gabapentin (NEURONTIN) 100 MG capsule Take one tablet in the morning PO, take one tablet midday, and take two-three tablets PO at bedtime. 90 capsule 5  . hydrocortisone (ANUSOL-HC) 25 MG suppository Place 1 suppository (25 mg total) rectally 2 (two) times daily as needed for hemorrhoids or anal itching. 12 suppository 2  . levothyroxine (SYNTHROID) 50 MCG tablet TAKE 1 TABLET BY MOUTH EVERY DAY 30 tablet 5  . losartan (COZAAR) 50 MG tablet Take by mouth.    . Multiple Vitamin (MULTI-VITAMIN) tablet Take by mouth.    [name] Kitchen omeprazole (PRILOSEC) 20 MG capsule TAKE 1 CAPSULE BY MOUTH EVERY DAY 30 capsule 5  . potassium chloride (KLOR-CON) 10 MEQ tablet Take 1 tablet by mouth 3 (three) times a week.    . rosuvastatin (CRESTOR) 40 MG tablet TAKE 1 TABLET BY MOUTH EVERY DAY 30 tablet 8  . traZODone (DESYREL) 100 MG tablet TAKE 1 TABLET BY MOUTH EVERYDAY AT BEDTIME 90 tablet 16  . amoxicillin-clavulanate (AUGMENTIN) 875-125 MG tablet     . Diclofenac-miSOPROStol 75-0.2 MG TBEC     . doxycycline (VIBRAMYCIN) 100 MG capsule     . LINZESS 290 MCG CAPS capsule Take 290 mcg by mouth every morning.    [name] Kitchen lisinopril (ZESTRIL) 10 MG tablet     . montelukast (SINGULAIR) 10 MG tablet     . naphazoline-pheniramine (ALLERGY EYE) 0.025-0.3 % ophthalmic solution Place 1 drop into both eyes 4 (four) times daily as needed for eye irritation or allergies. 15 mL 1  . triamcinolone cream (KENALOG) 0.1 %      No facility-administered medications prior to visit.      ROS:  Review of Systems  Constitutional: Negative for fever.  Gastrointestinal: Negative for blood in stool, constipation,  diarrhea, nausea and vomiting.  Genitourinary: Negative for dyspareunia, dysuria, flank pain, frequency, hematuria, urgency, vaginal bleeding, vaginal discharge and vaginal pain.  Musculoskeletal: Negative for back pain.  Skin: Negative for rash.   BREAST: No symptoms  OBJECTIVE:   Vitals:  BP 120/70   Ht 5\' 1"  (1.549 m)   Wt 232 lb (105.2 kg)   BMI 43.84 kg/m   Physical Exam Vitals reviewed.  Constitutional:      Appearance: She is well-developed.  Pulmonary:     Effort: Pulmonary effort is normal.  Genitourinary:    General: Normal vulva.     Pubic Area: No rash.      Labia:        Right: No rash, tenderness or lesion.        Left: No rash, tenderness or lesion.      Vagina: Normal. No vaginal discharge, erythema or tenderness.     Cervix: Normal.     Uterus: Normal. Not enlarged and not tender.      Adnexa: Right adnexa normal and left adnexa normal.       Right: No mass or tenderness.         Left: No mass or tenderness.       Comments: MILD ERYTHEMA AT INTROITUS Musculoskeletal:        General: Normal range of motion.     Cervical back: Normal range of motion.  Skin:    General: Skin is warm and dry.  Neurological:     General: No focal deficit present.     Mental Status: She is alert and oriented to person, place, and time.  Psychiatric:        Mood and Affect: Mood normal.        Behavior: Behavior normal.        Thought Content: Thought content normal.        Judgment: Judgment normal.     Results: Results for orders placed or performed in visit on 03/06/19 (from the past 24 hour(s))  POCT Wet Prep with KOH     Status: Normal   Collection Time: 03/06/19  2:22 PM  Result Value Ref Range   Trichomonas, UA Negative    Clue Cells Wet Prep HPF POC neg    Epithelial Wet Prep HPF POC     Yeast Wet Prep HPF POC neg    Bacteria Wet Prep HPF POC     RBC Wet Prep HPF POC     WBC Wet Prep HPF POC     KOH Prep POC Negative Negative      Assessment/Plan: Vaginal itching - Plan: fluconazole (DIFLUCAN) 150 MG tablet, POCT Wet Prep with KOH; Neg wet prep, pos sx and exam. Question chem vs fungal. Rx diflucan. D/c scented wipes. Can do unscented baby wipes. F/u prn.   Meds ordered this encounter  Medications  . fluconazole (DIFLUCAN) 150 MG tablet    Sig: Take 1 tablet (150 mg total) by mouth once for 1 dose.    Dispense:  1 tablet    Refill:  0    Order Specific Question:   Supervising Provider    Answer:   Gae Dry [id number]      Return if symptoms worsen or fail to improve.  Margarita Bobrowski B. Gabrielly Mccrystal, PA-C 03/06/2019 2:23 PM

## 2019-03-06 ENCOUNTER — Encounter: Payer: Self-pay | Admitting: Obstetrics and Gynecology

## 2019-03-06 ENCOUNTER — Other Ambulatory Visit: Payer: Self-pay

## 2019-03-06 ENCOUNTER — Ambulatory Visit (INDEPENDENT_AMBULATORY_CARE_PROVIDER_SITE_OTHER): Payer: Medicare Other | Admitting: Obstetrics and Gynecology

## 2019-03-06 VITALS — BP 120/70 | Ht 61.0 in | Wt 232.0 lb

## 2019-03-06 DIAGNOSIS — Z01818 Encounter for other preprocedural examination: Secondary | ICD-10-CM | POA: Insufficient documentation

## 2019-03-06 DIAGNOSIS — R0600 Dyspnea, unspecified: Secondary | ICD-10-CM | POA: Diagnosis not present

## 2019-03-06 DIAGNOSIS — R600 Localized edema: Secondary | ICD-10-CM | POA: Diagnosis not present

## 2019-03-06 DIAGNOSIS — N7689 Other specified inflammation of vagina and vulva: Secondary | ICD-10-CM | POA: Diagnosis not present

## 2019-03-06 DIAGNOSIS — I872 Venous insufficiency (chronic) (peripheral): Secondary | ICD-10-CM | POA: Diagnosis not present

## 2019-03-06 DIAGNOSIS — N898 Other specified noninflammatory disorders of vagina: Secondary | ICD-10-CM

## 2019-03-06 DIAGNOSIS — E7849 Other hyperlipidemia: Secondary | ICD-10-CM | POA: Diagnosis not present

## 2019-03-06 DIAGNOSIS — R001 Bradycardia, unspecified: Secondary | ICD-10-CM | POA: Diagnosis not present

## 2019-03-06 DIAGNOSIS — R011 Cardiac murmur, unspecified: Secondary | ICD-10-CM | POA: Diagnosis not present

## 2019-03-06 DIAGNOSIS — I208 Other forms of angina pectoris: Secondary | ICD-10-CM | POA: Diagnosis not present

## 2019-03-06 LAB — POCT WET PREP WITH KOH
Clue Cells Wet Prep HPF POC: NEGATIVE
KOH Prep POC: NEGATIVE
Trichomonas, UA: NEGATIVE
Yeast Wet Prep HPF POC: NEGATIVE

## 2019-03-06 MED ORDER — FLUCONAZOLE 150 MG PO TABS: 150.0000 mg | ORAL_TABLET | Freq: Once | ORAL | 0 refills | Status: AC

## 2019-03-06 NOTE — Patient Instructions (Signed)
I value your feedback and entrusting us with your care. If you get a Colon patient survey, I would appreciate you taking the time to let us know about your experience today. Thank you!  As of December 27, 2018, your lab results will be released to your MyChart immediately, before I even have a chance to see them. Please give me time to review them and contact you if there are any abnormalities. Thank you for your patience.  

## 2019-03-14 DIAGNOSIS — Z9884 Bariatric surgery status: Secondary | ICD-10-CM | POA: Diagnosis not present

## 2019-03-14 DIAGNOSIS — E669 Obesity, unspecified: Secondary | ICD-10-CM | POA: Diagnosis not present

## 2019-03-14 DIAGNOSIS — M50222 Other cervical disc displacement at C5-C6 level: Secondary | ICD-10-CM | POA: Diagnosis not present

## 2019-03-14 DIAGNOSIS — M9901 Segmental and somatic dysfunction of cervical region: Secondary | ICD-10-CM | POA: Diagnosis not present

## 2019-03-14 DIAGNOSIS — Z713 Dietary counseling and surveillance: Secondary | ICD-10-CM | POA: Diagnosis not present

## 2019-03-14 DIAGNOSIS — M5127 Other intervertebral disc displacement, lumbosacral region: Secondary | ICD-10-CM | POA: Diagnosis not present

## 2019-03-14 DIAGNOSIS — M9903 Segmental and somatic dysfunction of lumbar region: Secondary | ICD-10-CM | POA: Diagnosis not present

## 2019-03-15 ENCOUNTER — Ambulatory Visit: Payer: Medicare Other | Attending: Internal Medicine

## 2019-03-15 DIAGNOSIS — Z23 Encounter for immunization: Secondary | ICD-10-CM

## 2019-03-15 NOTE — Progress Notes (Signed)
   Covid-19 Vaccination Clinic  Name:  Jennifer [name]    MRN: [id number] DOB: [birth year]  03/15/2019  [name] was observed post Covid-19 immunization for 15 minutes without incidence. She was provided with Vaccine Information Sheet and instruction to access the V-Safe system.   [name] was instructed to call 911 with any severe reactions post vaccine: [name] Kitchen Difficulty breathing  . Swelling of your face and throat  . A fast heartbeat  . A bad rash all over your body  . Dizziness and weakness    Immunizations Administered    Name Date Dose VIS Date Route   Pfizer COVID-19 Vaccine 03/15/2019  9:04 AM 0.3 mL 12/28/2018 Intramuscular   Manufacturer: [place]   Lot: [id number]   [place]: [id number]

## 2019-03-19 DIAGNOSIS — M5127 Other intervertebral disc displacement, lumbosacral region: Secondary | ICD-10-CM | POA: Diagnosis not present

## 2019-03-19 DIAGNOSIS — M9903 Segmental and somatic dysfunction of lumbar region: Secondary | ICD-10-CM | POA: Diagnosis not present

## 2019-03-19 DIAGNOSIS — M9901 Segmental and somatic dysfunction of cervical region: Secondary | ICD-10-CM | POA: Diagnosis not present

## 2019-03-19 DIAGNOSIS — M50222 Other cervical disc displacement at C5-C6 level: Secondary | ICD-10-CM | POA: Diagnosis not present

## 2019-03-21 DIAGNOSIS — M9901 Segmental and somatic dysfunction of cervical region: Secondary | ICD-10-CM | POA: Diagnosis not present

## 2019-03-21 DIAGNOSIS — M9903 Segmental and somatic dysfunction of lumbar region: Secondary | ICD-10-CM | POA: Diagnosis not present

## 2019-03-21 DIAGNOSIS — M5127 Other intervertebral disc displacement, lumbosacral region: Secondary | ICD-10-CM | POA: Diagnosis not present

## 2019-03-21 DIAGNOSIS — M50222 Other cervical disc displacement at C5-C6 level: Secondary | ICD-10-CM | POA: Diagnosis not present

## 2019-03-26 DIAGNOSIS — M5127 Other intervertebral disc displacement, lumbosacral region: Secondary | ICD-10-CM | POA: Diagnosis not present

## 2019-03-26 DIAGNOSIS — M50222 Other cervical disc displacement at C5-C6 level: Secondary | ICD-10-CM | POA: Diagnosis not present

## 2019-03-26 DIAGNOSIS — H0289 Other specified disorders of eyelid: Secondary | ICD-10-CM | POA: Diagnosis not present

## 2019-03-26 DIAGNOSIS — M9903 Segmental and somatic dysfunction of lumbar region: Secondary | ICD-10-CM | POA: Diagnosis not present

## 2019-03-26 DIAGNOSIS — M9901 Segmental and somatic dysfunction of cervical region: Secondary | ICD-10-CM | POA: Diagnosis not present

## 2019-03-29 DIAGNOSIS — M9903 Segmental and somatic dysfunction of lumbar region: Secondary | ICD-10-CM | POA: Diagnosis not present

## 2019-03-29 DIAGNOSIS — M9901 Segmental and somatic dysfunction of cervical region: Secondary | ICD-10-CM | POA: Diagnosis not present

## 2019-03-29 DIAGNOSIS — M5127 Other intervertebral disc displacement, lumbosacral region: Secondary | ICD-10-CM | POA: Diagnosis not present

## 2019-03-29 DIAGNOSIS — M50222 Other cervical disc displacement at C5-C6 level: Secondary | ICD-10-CM | POA: Diagnosis not present

## 2019-04-01 DIAGNOSIS — M9903 Segmental and somatic dysfunction of lumbar region: Secondary | ICD-10-CM | POA: Diagnosis not present

## 2019-04-01 DIAGNOSIS — M50222 Other cervical disc displacement at C5-C6 level: Secondary | ICD-10-CM | POA: Diagnosis not present

## 2019-04-01 DIAGNOSIS — M9901 Segmental and somatic dysfunction of cervical region: Secondary | ICD-10-CM | POA: Diagnosis not present

## 2019-04-01 DIAGNOSIS — M5127 Other intervertebral disc displacement, lumbosacral region: Secondary | ICD-10-CM | POA: Diagnosis not present

## 2019-04-04 DIAGNOSIS — M5127 Other intervertebral disc displacement, lumbosacral region: Secondary | ICD-10-CM | POA: Diagnosis not present

## 2019-04-04 DIAGNOSIS — M9901 Segmental and somatic dysfunction of cervical region: Secondary | ICD-10-CM | POA: Diagnosis not present

## 2019-04-04 DIAGNOSIS — M50222 Other cervical disc displacement at C5-C6 level: Secondary | ICD-10-CM | POA: Diagnosis not present

## 2019-04-04 DIAGNOSIS — M9903 Segmental and somatic dysfunction of lumbar region: Secondary | ICD-10-CM | POA: Diagnosis not present

## 2019-04-09 ENCOUNTER — Telehealth: Payer: Self-pay | Admitting: Family

## 2019-04-09 ENCOUNTER — Ambulatory Visit: Payer: Medicare Other | Attending: Internal Medicine

## 2019-04-09 ENCOUNTER — Other Ambulatory Visit: Payer: Self-pay

## 2019-04-09 ENCOUNTER — Ambulatory Visit (INDEPENDENT_AMBULATORY_CARE_PROVIDER_SITE_OTHER): Payer: Medicare Other

## 2019-04-09 ENCOUNTER — Ambulatory Visit (INDEPENDENT_AMBULATORY_CARE_PROVIDER_SITE_OTHER): Payer: Medicare Other | Admitting: Family

## 2019-04-09 ENCOUNTER — Encounter: Payer: Self-pay | Admitting: Family

## 2019-04-09 VITALS — BP 130/76 | HR 74 | Temp 97.3°F | Resp 15 | Ht 61.0 in | Wt 234.0 lb

## 2019-04-09 DIAGNOSIS — M79671 Pain in right foot: Secondary | ICD-10-CM | POA: Diagnosis not present

## 2019-04-09 DIAGNOSIS — I872 Venous insufficiency (chronic) (peripheral): Secondary | ICD-10-CM | POA: Diagnosis not present

## 2019-04-09 DIAGNOSIS — Z23 Encounter for immunization: Secondary | ICD-10-CM

## 2019-04-09 DIAGNOSIS — M542 Cervicalgia: Secondary | ICD-10-CM | POA: Diagnosis not present

## 2019-04-09 DIAGNOSIS — I10 Essential (primary) hypertension: Secondary | ICD-10-CM

## 2019-04-09 DIAGNOSIS — G629 Polyneuropathy, unspecified: Secondary | ICD-10-CM | POA: Diagnosis not present

## 2019-04-09 DIAGNOSIS — R7301 Impaired fasting glucose: Secondary | ICD-10-CM | POA: Diagnosis not present

## 2019-04-09 LAB — BASIC METABOLIC PANEL
BUN: 23 mg/dL (ref 6–23)
CO2: 35 mEq/L — ABNORMAL HIGH (ref 19–32)
Calcium: 9.8 mg/dL (ref 8.4–10.5)
Chloride: 100 mEq/L (ref 96–112)
Creatinine, Ser: 0.81 mg/dL (ref 0.40–1.20)
GFR: 84.67 mL/min (ref >60.00)
Glucose, Bld: 108 mg/dL — ABNORMAL HIGH (ref 70–99)
Potassium: 3.9 mEq/L (ref 3.5–5.1)
Sodium: 139 mEq/L (ref 135–145)

## 2019-04-09 LAB — HEMOGLOBIN A1C: Hgb A1c MFr Bld: 6.8 % — ABNORMAL HIGH (ref 4.6–6.5)

## 2019-04-09 MED ORDER — GABAPENTIN 100 MG PO CAPS: 200.0000 mg | ORAL_CAPSULE | Freq: Three times a day (TID) | ORAL | 5 refills | Status: DC

## 2019-04-09 NOTE — Assessment & Plan Note (Signed)
Stable, continue current regimen 

## 2019-04-09 NOTE — Assessment & Plan Note (Signed)
Chronic. Pending XR. Advised to increase gabapentin. Close follow up.

## 2019-04-09 NOTE — Assessment & Plan Note (Signed)
Stable, symptomatically quite frustrating for patient.  Trial increase gabapentin.  Close follow-up.

## 2019-04-09 NOTE — Patient Instructions (Addendum)
Ask Dr Darnell Level if you are able to take aspirin or other anti inflammatories. Please clarify this.   Trial increase gabapentin to 200 mg 3 times a day.  Close follow up.    Cervical Radiculopathy  Cervical radiculopathy happens when a nerve in the neck (a cervical nerve) is pinched or bruised. This condition can happen because of an injury to the cervical spine (vertebrae) in the neck, or as part of the normal aging process. Pressure on the cervical nerves can cause pain or numbness that travels from the neck all the way down into the arm and fingers. Usually, this condition gets better with rest. Treatment may be needed if the condition does not improve. What are the causes? This condition may be caused by:  A neck injury.  A bulging (herniated) disk.  Muscle spasms.  Muscle tightness in the neck because of overuse.  Arthritis.  Breakdown or degeneration in the bones and joints of the spine (spondylosis) due to aging.  Bone spurs that may develop near the cervical nerves. What are the signs or symptoms? Symptoms of this condition include:  Pain. The pain may travel from the neck to the arm and hand. The pain can be severe or irritating. It may be worse when you move your neck.  Numbness or tingling in your arm or hand.  Weakness in the affected arm and hand, in severe cases. How is this diagnosed? This condition may be diagnosed based on your symptoms, your medical history, and a physical exam. You may also have tests, including:  X-rays.  A CT scan.  An MRI.  An electromyogram (EMG).  Nerve conduction tests. How is this treated? In many cases, treatment is not needed for this condition. With rest, the condition usually gets better over time. If treatment is needed, options may include:  Wearing a soft neck collar (cervical collar) for short periods of time, as told by your health care provider.  Doing physical therapy to strengthen your neck muscles.  Taking  medicines, such as NSAIDs or oral corticosteroids.  Having spinal injections, in severe cases.  Having surgery. This may be needed if other treatments do not help. Different types of surgery may be done depending on the cause of this condition. Follow these instructions at home: If you have a cervical collar:  Wear it as told by your health care provider. Remove it only as told by your health care provider.  Ask your health care provider if you can remove the collar for cleaning and bathing. If you are allowed to remove the collar for cleaning or bathing: ? Follow instructions from your health care provider about how to remove the collar safely. ? Clean the collar by wiping it with mild soap and water and drying it completely. ? Take out any removable pads in the collar every 1-2 days, and wash them by hand with soap and water. Let them air-dry completely before you put them back in the collar. ? Check your skin under the collar for irritation or sores. If you see any, tell your health care provider. Managing pain      Take over-the-counter and prescription medicines only as told by your health care provider.  If directed, put ice on the affected area. ? If you have a soft neck collar, remove it as told by your health care provider. ? Put ice in a plastic bag. ? Place a towel between your skin and the bag. ? Leave the ice on for 20 minutes,  2-3 times a day.  If applying ice does not help, you can try using heat. Use the heat source that your health care provider recommends, such as a moist heat pack or a heating pad. ? Place a towel between your skin and the heat source. ? Leave the heat on for 20-30 minutes. ? Remove the heat if your skin turns bright red. This is especially important if you are unable to feel pain, heat, or cold. You may have a greater risk of getting burned.  Try a gentle neck and shoulder massage to help relieve symptoms. Activity  Rest as needed.  Return to  your normal activities as told by your health care provider. Ask your health care provider what activities are safe for you.  Do stretching and strengthening exercises as told by your health care provider or physical therapist.  Do not lift anything that is heavier than 10 lb (4.5 kg) until your health care provider tells you that it is safe. General instructions  Use a flat pillow when you sleep.  Do not drive while wearing a cervical collar. If you do not have a cervical collar, ask your health care provider if it is safe to drive while your neck heals.  Ask your health care provider if the medicine prescribed to you requires you to avoid driving or using heavy machinery.  Do not use any products that contain nicotine or tobacco, such as cigarettes, e-cigarettes, and chewing tobacco. These can delay healing. If you need help quitting, ask your health care provider.  Keep all follow-up visits as told by your health care provider. This is important. Contact a health care provider if:  Your condition does not improve with treatment. Get help right away if:  Your pain gets much worse and cannot be controlled with medicines.  You have weakness or numbness in your hand, arm, face, or leg.  You have a high fever.  You have a stiff, rigid neck.  You lose control of your bowels or your bladder (have incontinence).  You have trouble with walking, balance, or speaking. Summary  Cervical radiculopathy happens when a nerve in the neck is pinched or bruised.  A nerve can get pinched from a bulging disk, arthritis, muscle spasms, or an injury to the neck.  Symptoms include pain, tingling, or numbness radiating from the neck into the arm or hand. Weakness can also occur in severe cases.  Treatment may include rest, wearing a cervical collar, and physical therapy. Medicines may be prescribed to help with pain. In severe cases, injections or surgery may be needed. This information is not  intended to replace advice given to you by your health care provider. Make sure you discuss any questions you have with your health care provider. Document Revised: 11/24/2017 Document Reviewed: 11/24/2017 Elsevier Patient Education  2020 [employer].

## 2019-04-09 NOTE — Progress Notes (Signed)
   Covid-19 Vaccination Clinic  Name:  Kymira Therien    MRN: [id number] DOB: 1950-07-22  04/09/2019  Ms. Callins was observed post Covid-19 immunization for 15 minutes without incident. She was provided with Vaccine Information Sheet and instruction to access the V-Safe system.   Ms. Deperalta was instructed to call 911 with any severe reactions post vaccine: [name] Kitchen Difficulty breathing  . Swelling of face and throat  . A fast heartbeat  . A bad rash all over body  . Dizziness and weakness   Immunizations Administered    Name Date Dose VIS Date Route   Pfizer COVID-19 Vaccine 04/09/2019 -- -- --   Pfizer COVID-19 Vaccine 04/09/2019  1:19 PM 0.3 mL 12/28/2018 Intramuscular   Manufacturer: [place]   Lot: [id number]   [place]: [id number]

## 2019-04-09 NOTE — Progress Notes (Signed)
Subjective:    Patient ID: Jennifer [name], female    DOB: 01-25-1950, 69 y.o.   MRN: [id number]  CC: Jennifer [name] is a 69 y.o. female who presents today for follow up.   HPI:  Complains of posterior neck pain for 2-3 months, some improvement after seeing chiropracter. Feels like muscles are twisted. No headache, vision changes. . No h/o cancer.  No weakness in arms, numbness. Has tried tylenol, icy hot with some relief.   Complains of bilateral feet dorsal numbness, couple of years, unchanged. Taking gabapentin 100mg  tid.  Its more 'uncomfortable' versus pain. Feet feel cold at night, and she is wearing heating pad, and doesn't put 'all the way up' on the setting. NO wounds.   Has seen Dr Vickki Muff, podiatry in the past; I cannot find consult in epic.    BLE swelling- started demadex by Dr Clayborn Bigness recently and cannot tell difference.Compliant with compression stocking.  Improves with supine. Worsens with being on feet all day.   HTN- at home ranges from 120-135/ 55-70. No cp, palpitations.   Duodenal switch with dr Darnell Level- planned for April 2020.   02/2019- dr schneir- chronic venous insufficiency, compression stockings , has been unable to lymphedema 03/06/2019- Dr Clayborn Bigness- started 40mg  toresmide daily. Decided not to change to losartan 100mg  ( patient called after visit). She is on losartan 50mg .  HISTORY:  Past Medical History:  Diagnosis Date  . Anxiety   . Constipation   . Elevated liver enzymes   . Hemorrhoids   . Herpes genitalis   . High cholesterol   . Hyperlipidemia   . Hypertension   . Hypothyroidism   . Osteoarthritis   . Sleep apnea    Past Surgical History:  Procedure Laterality Date  . ABDOMINAL HYSTERECTOMY     total for fibroids no h/o abnormal pap  . bariatric sleeve  2015  . BREAST EXCISIONAL BIOPSY Left 1998  . carpal tunnel repair    . COLONOSCOPY WITH PROPOFOL N/A 02/11/2016   Procedure: COLONOSCOPY WITH PROPOFOL;  Surgeon: Jonathon Bellows, MD;   Location: [facility] ENDOSCOPY;  Service: Endoscopy;  Laterality: N/A;  . HEMORRHOID SURGERY     Family History  Problem Relation Age of Onset  . Breast cancer Sister 18       materal 1/2 sister  . Hypertension Sister   . Hypertension Mother   . Heart disease Father   . Hypertension Brother     Allergies: Celecoxib, Pollen extract, Nasacort [triamcinolone], and Vicodin [hydrocodone-acetaminophen] Current Outpatient Medications on File Prior to Visit  Medication Sig Dispense Refill  . acyclovir (ZOVIRAX) 400 MG tablet TAKE 1 TABLET BY MOUTH EVERY DAY 30 tablet 5  . aspirin 81 MG tablet Take 81 mg by mouth daily.    [name] Kitchen azelastine (ASTELIN) 0.1 % nasal spray USE 1 SPRAY EACH NOSTRIL TWICE A DAY AS NEEDED FOR ALLERGIES    . cholecalciferol (VITAMIN D) 1000 UNITS tablet Take 400 Units by mouth daily.     . fexofenadine (ALLEGRA) 180 MG tablet Take 1 tablet (180 mg total) by mouth daily. 30 tablet 3  . fluticasone (FLONASE) 50 MCG/ACT nasal spray PLACE 1 SPRAY INTO BOTH NOSTRILS DAILY. 16 mL 2  . hydrocortisone (ANUSOL-HC) 25 MG suppository Place 1 suppository (25 mg total) rectally 2 (two) times daily as needed for hemorrhoids or anal itching. 12 suppository 2  . levothyroxine (SYNTHROID) 50 MCG tablet TAKE 1 TABLET BY MOUTH EVERY DAY 30 tablet 5  . losartan (COZAAR) 50 MG tablet  Take by mouth.    . Multiple Vitamin (MULTI-VITAMIN) tablet Take by mouth.    [name] Kitchen omeprazole (PRILOSEC) 20 MG capsule TAKE 1 CAPSULE BY MOUTH EVERY DAY 30 capsule 5  . potassium chloride (KLOR-CON) 10 MEQ tablet Take 1 tablet by mouth 3 (three) times a week.    . rosuvastatin (CRESTOR) 40 MG tablet TAKE 1 TABLET BY MOUTH EVERY DAY 30 tablet 8  . traZODone (DESYREL) 100 MG tablet TAKE 1 TABLET BY MOUTH EVERYDAY AT BEDTIME 90 tablet 16  . torsemide (DEMADEX) 20 MG tablet Take 40 mg by mouth daily.     No current facility-administered medications on file prior to visit.    Social History   Tobacco Use  . Smoking status:  Former Smoker    Packs/day: 1.50    Years: 24.00    Pack years: 36.00    Types: Cigarettes    Quit date: 1993    Years since quitting: 28.2  . Smokeless tobacco: Never Used  . Tobacco comment: quit 1995.   Substance Use Topics  . Alcohol use: Yes    Alcohol/week: 14.0 standard drinks    Types: 14 Glasses of wine per week    Comment: occ  . Drug use: No    Review of Systems  Constitutional: Negative for chills and fever.  Respiratory: Negative for cough.   Cardiovascular: Positive for leg swelling. Negative for chest pain and palpitations.  Gastrointestinal: Negative for nausea and vomiting.  Musculoskeletal: Positive for neck pain.  Neurological: Positive for numbness (BL feet).      Objective:    BP 130/76 (BP Location: Left Arm, Patient Position: Sitting, Cuff Size: Large)   Pulse 74   Temp (!) 97.3 F (36.3 C) (Temporal)   Resp 15   Ht 5\' 1"  (1.549 m)   Wt 234 lb (106.1 kg)   SpO2 93%   BMI 44.21 kg/m  BP Readings from Last 3 Encounters:  04/09/19 130/76  03/06/19 120/70  02/28/19 (!) 142/72   Wt Readings from Last 3 Encounters:  04/09/19 234 lb (106.1 kg)  03/06/19 232 lb (105.2 kg)  02/28/19 231 lb (104.8 kg)    Physical Exam Vitals reviewed.  Constitutional:      Appearance: She is well-developed.  Eyes:     Conjunctiva/sclera: Conjunctivae normal.  Cardiovascular:     Rate and Rhythm: Normal rate and regular rhythm.     Pulses:          Dorsalis pedis pulses are 3+ on the right side and 3+ on the left side.     Heart sounds: Normal heart sounds.     Comments: +1 non pitting edema noted dorsal aspect bilateral feet.  Palpable pedal pulses.  Extremities are warm to the touch. No carotid bruit bilaterallly Pulmonary:     Effort: Pulmonary effort is normal.     Breath sounds: Normal breath sounds. No wheezing, rhonchi or rales.  Musculoskeletal:     Cervical back: Normal. No swelling or bony tenderness. No pain with movement. Normal range of  motion.       Back:     Right lower leg: 1+ Edema present.     Left lower leg: 1+ Edema present.     Right foot: Normal range of motion.     Left foot: Normal range of motion.     Comments: Area of pain marked on diagram as described by patient.  Feet:     Right foot:     Skin integrity: Skin  integrity normal.     Left foot:     Skin integrity: Skin integrity normal.     Comments: Sensation intact bilateral ventral and dorsal aspect of feet Skin:    General: Skin is warm and dry.  Neurological:     Mental Status: She is alert.     Comments: Bilateral grip strength is strong.  Sensation intact bilateral upper extremities.  Psychiatric:        Speech: Speech normal.        Behavior: Behavior normal.        Thought Content: Thought content normal.        Assessment & Plan:   Problem List Items Addressed This Visit      Cardiovascular and Mediastinum   Chronic venous insufficiency    Stable.  Still quite frustrating for  patient.  We discussed at length today that I think starting a lymphedema pump the left first step to see if this is helpful.  She been compliant with compression stockings.  She would like second opinion from vascular; referral has been placed. Advised to stay on current demadex dose. Pending bmp today.       Relevant Medications   torsemide (DEMADEX) 20 MG tablet   Other Relevant Orders   Basic metabolic panel   Ambulatory referral to Vascular Surgery   Essential hypertension - Primary    Stable, continue current regimen      Relevant Medications   torsemide (DEMADEX) 20 MG tablet   Other Relevant Orders   Basic metabolic panel     Endocrine   Impaired fasting glucose   Relevant Orders   Hemoglobin A1c     Nervous and Auditory   Peripheral neuropathy    Stable, symptomatically quite frustrating for patient.  Trial increase gabapentin.  Close follow-up.      Relevant Medications   gabapentin (NEURONTIN) 100 MG capsule     Other   Neck pain     Chronic. Pending XR. Advised to increase gabapentin. Close follow up.       Relevant Orders   DG Cervical Spine Complete    Other Visit Diagnoses    Right foot pain       Relevant Medications   gabapentin (NEURONTIN) 100 MG capsule       I have discontinued Tahja Cuen's furosemide. I have also changed her gabapentin. Additionally, I am having her maintain her aspirin, cholecalciferol, hydrocortisone, omeprazole, azelastine, Multi-Vitamin, rosuvastatin, acyclovir, traZODone, levothyroxine, fexofenadine, losartan, potassium chloride, fluticasone, and torsemide.   Meds ordered this encounter  Medications  . gabapentin (NEURONTIN) 100 MG capsule    Sig: Take 2 capsules (200 mg total) by mouth 3 (three) times daily.    Dispense:  90 capsule    Refill:  5    G62.9    Order Specific Question:   Supervising Provider    Answer:   Crecencio Mc [[id number]]    Return precautions given.   Risks, benefits, and alternatives of the medications and treatment plan prescribed today were discussed, and patient expressed understanding.   Education regarding symptom management and diagnosis given to patient on AVS.  Continue to follow with [name], FNP for routine health maintenance.   Jennifer [name] and I agreed with plan.   [name], FNP

## 2019-04-09 NOTE — Telephone Encounter (Signed)
I called pt and left avm to call ofc for more clarification of where to send referral.

## 2019-04-09 NOTE — Assessment & Plan Note (Signed)
Stable.  Still quite frustrating for  patient.  We discussed at length today that I think starting a lymphedema pump the left first step to see if this is helpful.  She been compliant with compression stockings.  She would like second opinion from vascular; referral has been placed. Advised to stay on current demadex dose. Pending bmp today.

## 2019-04-11 ENCOUNTER — Telehealth: Payer: Self-pay | Admitting: Family

## 2019-04-11 NOTE — Telephone Encounter (Signed)
Pt called in and had some questions about taking metformin. She said she is willing to try it and wants you to send it to [facility]. I let her know someone will call her back about her questions and that I will send to [facility] to send in prescription per lab result note.

## 2019-04-12 DIAGNOSIS — M50222 Other cervical disc displacement at C5-C6 level: Secondary | ICD-10-CM | POA: Diagnosis not present

## 2019-04-12 DIAGNOSIS — M5127 Other intervertebral disc displacement, lumbosacral region: Secondary | ICD-10-CM | POA: Diagnosis not present

## 2019-04-12 DIAGNOSIS — M9901 Segmental and somatic dysfunction of cervical region: Secondary | ICD-10-CM | POA: Diagnosis not present

## 2019-04-12 DIAGNOSIS — M9903 Segmental and somatic dysfunction of lumbar region: Secondary | ICD-10-CM | POA: Diagnosis not present

## 2019-04-12 NOTE — Telephone Encounter (Signed)
LMTCB

## 2019-04-13 MED ORDER — METFORMIN HCL 500 MG PO TABS: 500.0000 mg | ORAL_TABLET | Freq: Two times a day (BID) | ORAL | 0 refills | Status: DC

## 2019-04-13 NOTE — Telephone Encounter (Signed)
Metformin sent to pharmacy

## 2019-04-15 NOTE — Telephone Encounter (Signed)
I spoke with patient to let her know that metformin was sent. She wanted to know if she may be able to stop medication after her bariatric surgery. I told her that would be to be determined. For some weight loss really helps others still have to stay on medication. She also asked me about side effects & I let her know mainly stomach upset & she could let us know if she had issues tolerating.

## 2019-04-17 DIAGNOSIS — Z6841 Body Mass Index (BMI) 40.0 and over, adult: Secondary | ICD-10-CM | POA: Diagnosis not present

## 2019-04-17 DIAGNOSIS — Z9884 Bariatric surgery status: Secondary | ICD-10-CM | POA: Diagnosis not present

## 2019-04-17 DIAGNOSIS — G4733 Obstructive sleep apnea (adult) (pediatric): Secondary | ICD-10-CM | POA: Diagnosis not present

## 2019-04-22 DIAGNOSIS — M25511 Pain in right shoulder: Secondary | ICD-10-CM | POA: Diagnosis not present

## 2019-04-22 DIAGNOSIS — R2231 Localized swelling, mass and lump, right upper limb: Secondary | ICD-10-CM | POA: Diagnosis not present

## 2019-04-22 DIAGNOSIS — M25512 Pain in left shoulder: Secondary | ICD-10-CM | POA: Diagnosis not present

## 2019-04-22 DIAGNOSIS — M7582 Other shoulder lesions, left shoulder: Secondary | ICD-10-CM | POA: Diagnosis not present

## 2019-04-22 DIAGNOSIS — M7581 Other shoulder lesions, right shoulder: Secondary | ICD-10-CM | POA: Diagnosis not present

## 2019-04-24 ENCOUNTER — Other Ambulatory Visit: Payer: Self-pay | Admitting: Student

## 2019-04-24 ENCOUNTER — Other Ambulatory Visit: Payer: Self-pay | Admitting: Family

## 2019-04-24 DIAGNOSIS — R2231 Localized swelling, mass and lump, right upper limb: Secondary | ICD-10-CM

## 2019-04-24 DIAGNOSIS — M25512 Pain in left shoulder: Secondary | ICD-10-CM

## 2019-04-24 DIAGNOSIS — M25511 Pain in right shoulder: Secondary | ICD-10-CM

## 2019-04-24 DIAGNOSIS — M7581 Other shoulder lesions, right shoulder: Secondary | ICD-10-CM

## 2019-04-29 ENCOUNTER — Telehealth: Payer: Self-pay | Admitting: Pulmonary Disease

## 2019-04-29 DIAGNOSIS — G4733 Obstructive sleep apnea (adult) (pediatric): Secondary | ICD-10-CM

## 2019-04-29 NOTE — Telephone Encounter (Signed)
I called pt to discuss message:  Can we send in a Rx for CPAP supplies?   Her CPAP is set on 10cm. She would like to start her pressure in the middle like 7cm versus starting at zero. I was trying to understand what she was trying to explain but had a hard time understanding what she is requesting. I advised her that I do not work with the actual CPAP machines and that we only place the orders.  She wants it to start mid-way at 7 instead of zero. She states it is more comfortable for her to start at that pressure. She may be wanting auto set pressures on her CPAP from what I am guessing. Please advise.

## 2019-04-29 NOTE — Telephone Encounter (Signed)
Called pt and advised message from the provider. Pt understood and verbalized understanding. Nothing further is needed.   Order placed for supplies and to call [employer] to change her start pressure.

## 2019-04-29 NOTE — Telephone Encounter (Signed)
I believe that the patient is requesting if she would like for her ramp up start pressure to be 7, and continue to have her set pressure of 10.  Her ramp up pressure with her DME company is able to be adjusted.  She can contact her DME company and we can also send an order.  This is been an ongoing issue for the patient trying to find a comfort level.  Please make sure she is in for a recall with Dr. Halford Chessman or if patient would like we can get the patient scheduled for a follow-up in [place] with Dr. Halford Chessman if he is availability I believe his June/2021 schedules out.  [name] FNP

## 2019-05-06 ENCOUNTER — Other Ambulatory Visit: Payer: Self-pay | Admitting: Family

## 2019-05-06 DIAGNOSIS — E039 Hypothyroidism, unspecified: Secondary | ICD-10-CM

## 2019-05-06 DIAGNOSIS — Z0000 Encounter for general adult medical examination without abnormal findings: Secondary | ICD-10-CM

## 2019-05-06 DIAGNOSIS — Z01818 Encounter for other preprocedural examination: Secondary | ICD-10-CM | POA: Diagnosis not present

## 2019-05-06 DIAGNOSIS — Z9884 Bariatric surgery status: Secondary | ICD-10-CM | POA: Diagnosis not present

## 2019-05-07 ENCOUNTER — Ambulatory Visit
Admission: RE | Admit: 2019-05-07 | Discharge: 2019-05-07 | Disposition: A | Discharge status: Home / Self Care | Payer: Medicare Other | Source: Ambulatory Visit | Attending: Student | Admitting: Student

## 2019-05-07 ENCOUNTER — Telehealth: Payer: Self-pay | Admitting: Family

## 2019-05-07 ENCOUNTER — Other Ambulatory Visit: Payer: Self-pay

## 2019-05-07 DIAGNOSIS — M25512 Pain in left shoulder: Secondary | ICD-10-CM | POA: Insufficient documentation

## 2019-05-07 DIAGNOSIS — M7581 Other shoulder lesions, right shoulder: Secondary | ICD-10-CM | POA: Diagnosis not present

## 2019-05-07 DIAGNOSIS — M25511 Pain in right shoulder: Secondary | ICD-10-CM | POA: Diagnosis not present

## 2019-05-07 DIAGNOSIS — R2231 Localized swelling, mass and lump, right upper limb: Secondary | ICD-10-CM | POA: Insufficient documentation

## 2019-05-07 NOTE — Telephone Encounter (Signed)
I left pt a vm to call me regarding referral to vein specialist.

## 2019-05-09 NOTE — Telephone Encounter (Signed)
I spoke with pt she has an appt with the [facility] in [place] on 05/13/19 @1pm 

## 2019-05-13 DIAGNOSIS — I83893 Varicose veins of bilateral lower extremities with other complications: Secondary | ICD-10-CM | POA: Diagnosis not present

## 2019-05-16 DIAGNOSIS — E1142 Type 2 diabetes mellitus with diabetic polyneuropathy: Secondary | ICD-10-CM | POA: Diagnosis present

## 2019-05-16 DIAGNOSIS — Z79890 Hormone replacement therapy (postmenopausal): Secondary | ICD-10-CM | POA: Diagnosis not present

## 2019-05-16 DIAGNOSIS — G894 Chronic pain syndrome: Secondary | ICD-10-CM | POA: Diagnosis not present

## 2019-05-16 DIAGNOSIS — F419 Anxiety disorder, unspecified: Secondary | ICD-10-CM | POA: Diagnosis present

## 2019-05-16 DIAGNOSIS — Z90710 Acquired absence of both cervix and uterus: Secondary | ICD-10-CM | POA: Diagnosis not present

## 2019-05-16 DIAGNOSIS — Z79899 Other long term (current) drug therapy: Secondary | ICD-10-CM | POA: Diagnosis not present

## 2019-05-16 DIAGNOSIS — Z888 Allergy status to other drugs, medicaments and biological substances status: Secondary | ICD-10-CM | POA: Diagnosis not present

## 2019-05-16 DIAGNOSIS — G8918 Other acute postprocedural pain: Secondary | ICD-10-CM | POA: Diagnosis not present

## 2019-05-16 DIAGNOSIS — R1084 Generalized abdominal pain: Secondary | ICD-10-CM | POA: Diagnosis not present

## 2019-05-16 DIAGNOSIS — Z6841 Body Mass Index (BMI) 40.0 and over, adult: Secondary | ICD-10-CM | POA: Diagnosis not present

## 2019-05-16 DIAGNOSIS — K219 Gastro-esophageal reflux disease without esophagitis: Secondary | ICD-10-CM | POA: Diagnosis present

## 2019-05-16 DIAGNOSIS — Z809 Family history of malignant neoplasm, unspecified: Secondary | ICD-10-CM | POA: Diagnosis not present

## 2019-05-16 DIAGNOSIS — G4733 Obstructive sleep apnea (adult) (pediatric): Secondary | ICD-10-CM | POA: Diagnosis not present

## 2019-05-16 DIAGNOSIS — Z833 Family history of diabetes mellitus: Secondary | ICD-10-CM | POA: Diagnosis not present

## 2019-05-16 DIAGNOSIS — I10 Essential (primary) hypertension: Secondary | ICD-10-CM | POA: Diagnosis present

## 2019-05-16 DIAGNOSIS — Z87891 Personal history of nicotine dependence: Secondary | ICD-10-CM | POA: Diagnosis not present

## 2019-05-16 DIAGNOSIS — Z7982 Long term (current) use of aspirin: Secondary | ICD-10-CM | POA: Diagnosis not present

## 2019-05-16 DIAGNOSIS — E039 Hypothyroidism, unspecified: Secondary | ICD-10-CM | POA: Diagnosis present

## 2019-05-16 DIAGNOSIS — Z9884 Bariatric surgery status: Secondary | ICD-10-CM | POA: Diagnosis not present

## 2019-05-16 DIAGNOSIS — R110 Nausea: Secondary | ICD-10-CM | POA: Diagnosis not present

## 2019-05-16 DIAGNOSIS — Z886 Allergy status to analgesic agent status: Secondary | ICD-10-CM | POA: Diagnosis not present

## 2019-05-16 DIAGNOSIS — Z885 Allergy status to narcotic agent status: Secondary | ICD-10-CM | POA: Diagnosis not present

## 2019-05-16 DIAGNOSIS — Z9889 Other specified postprocedural states: Secondary | ICD-10-CM | POA: Diagnosis not present

## 2019-05-16 DIAGNOSIS — Z20822 Contact with and (suspected) exposure to covid-19: Secondary | ICD-10-CM | POA: Diagnosis present

## 2019-05-16 DIAGNOSIS — F329 Major depressive disorder, single episode, unspecified: Secondary | ICD-10-CM | POA: Diagnosis present

## 2019-05-16 DIAGNOSIS — K760 Fatty (change of) liver, not elsewhere classified: Secondary | ICD-10-CM | POA: Diagnosis present

## 2019-05-22 ENCOUNTER — Encounter: Payer: Self-pay | Admitting: Family

## 2019-05-22 ENCOUNTER — Ambulatory Visit (INDEPENDENT_AMBULATORY_CARE_PROVIDER_SITE_OTHER): Payer: Medicare Other | Admitting: Family

## 2019-05-22 ENCOUNTER — Other Ambulatory Visit: Payer: Self-pay | Admitting: Family

## 2019-05-22 ENCOUNTER — Other Ambulatory Visit: Payer: Self-pay

## 2019-05-22 DIAGNOSIS — I10 Essential (primary) hypertension: Secondary | ICD-10-CM

## 2019-05-22 DIAGNOSIS — K5900 Constipation, unspecified: Secondary | ICD-10-CM

## 2019-05-22 NOTE — Patient Instructions (Addendum)
STOP amlodipine due to swelling..  Continue losartan and lets give medication more time to lower blood pressure.    Monitor blood pressure at home and me 5-6 reading on separate days. Goal is less than 120/80, based on newest guidelines, however we certainly want to be less than 130/80;  if persistently higher, please make sooner follow up appointment so we can recheck you blood pressure and manage/ adjust medications.   Start colace and milk of magnesia. Call me if no bowel movement in one day as this is very concerning and also call Dr Darnell Level for anticipatory guidance on liquid diet and constipation.  Follow up in one week.

## 2019-05-22 NOTE — Assessment & Plan Note (Signed)
Concern as last BM 4 days ago.  Patient is passing gas.  She is nontoxic in appearance.  Advised her if doesn't have BM  In the next day when starting milk of magnesia, Colace, call the office immediately so I can order Ab XR.  Also advised her to call her surgeon's office as she is on a liquid diet at this time and I would like anticipatory guidance in regards to how often she should be having a bowel movement while on this diet.

## 2019-05-22 NOTE — Progress Notes (Signed)
Subjective:    Patient ID: Jennifer [name], female    DOB: [birth year], 69 y.o.   MRN: [id number]  CC: Jennifer [name] is a 69 y.o. female who presents today for follow up.   HPI: Elevated blood pressure At home, 152/83, 74, 167/76, HR 60. Told to discontinue calcium, losartan at hospital discharge as blood pressure had been low in the hospital. No dizziness, lightheadedness, cp.     On on her own started amlodipine 5mg ( not sure may be 2.5mg ) and losartan 50mg , started yesterday. Off losartan for one week.   Had revision of gastric sleeve and duodenal switch 4/28/ 21 with Dr Darnell Level. Endorses pain from incision. No fever, purulent discharge. Surgical glue in place.   On liquid diet. Cannot eat anything greater than a skittle. Passing gas.  endorses abdominal bloating.  last BM 4 days ago.   Neck pain , peripheral neuropathy-some improvement with gabapentin.   Vascular consult-vein and vascular, [place], [place]    Started on eliquis post operatively; also wearing support hose. Hydrocodone for 7 days.  Post operatively - Hemoglobin 13 HISTORY:  Past Medical History:  Diagnosis Date  . Anxiety   . Constipation   . Elevated liver enzymes   . Hemorrhoids   . Herpes genitalis   . High cholesterol   . Hyperlipidemia   . Hypertension   . Hypothyroidism   . Osteoarthritis   . Sleep apnea    Past Surgical History:  Procedure Laterality Date  . ABDOMINAL HYSTERECTOMY     total for fibroids no h/o abnormal pap  . bariatric sleeve  2015  . BREAST EXCISIONAL BIOPSY Left 1998  . carpal tunnel repair    . COLONOSCOPY WITH PROPOFOL N/A 02/11/2016   Procedure: COLONOSCOPY WITH PROPOFOL;  Surgeon: Jonathon Bellows, MD;  Location: [facility] ENDOSCOPY;  Service: Endoscopy;  Laterality: N/A;  . HEMORRHOID SURGERY     Family History  Problem Relation Age of Onset  . Breast cancer Sister 33       materal 1/2 sister  . Hypertension Sister   . Hypertension Mother   . Heart disease Father   .  Hypertension Brother     Allergies: Celecoxib, Pollen extract, Nasacort [triamcinolone], and Vicodin [hydrocodone-acetaminophen] Current Outpatient Medications on File Prior to Visit  Medication Sig Dispense Refill  . acyclovir (ZOVIRAX) 400 MG tablet TAKE 1 TABLET BY MOUTH EVERY DAY 30 tablet 5  . aspirin 81 MG tablet Take 81 mg by mouth daily.    [name] Kitchen azelastine (ASTELIN) 0.1 % nasal spray USE 1 SPRAY EACH NOSTRIL TWICE A DAY AS NEEDED FOR ALLERGIES    . cholecalciferol (VITAMIN D) 1000 UNITS tablet Take 400 Units by mouth daily.     [name] Kitchen ELIQUIS 2.5 MG TABS tablet Take 2.5 mg by mouth 2 (two) times daily.    . fexofenadine (ALLEGRA) 180 MG tablet Take 1 tablet (180 mg total) by mouth daily. 30 tablet 3  . fluticasone (FLONASE) 50 MCG/ACT nasal spray PLACE 1 SPRAY INTO BOTH NOSTRILS DAILY. 16 mL 2  . gabapentin (NEURONTIN) 100 MG capsule Take 2 capsules (200 mg total) by mouth 3 (three) times daily. 90 capsule 5  . hydrocortisone (ANUSOL-HC) 25 MG suppository Place 1 suppository (25 mg total) rectally 2 (two) times daily as needed for hemorrhoids or anal itching. 12 suppository 2  . levothyroxine (SYNTHROID) 50 MCG tablet TAKE 1 TABLET BY MOUTH EVERY DAY 30 tablet 5  . losartan (COZAAR) 50 MG tablet Take by mouth.    [name] Kitchen  Multiple Vitamin (MULTI-VITAMIN) tablet Take by mouth.    [name] Kitchen omeprazole (PRILOSEC) 20 MG capsule TAKE 1 CAPSULE BY MOUTH EVERY DAY 30 capsule 5  . ondansetron (ZOFRAN-ODT) 4 MG disintegrating tablet Take 4 mg by mouth every 8 (eight) hours as needed.    . potassium chloride (KLOR-CON) 10 MEQ tablet Take 1 tablet by mouth 3 (three) times a week.    . rosuvastatin (CRESTOR) 40 MG tablet TAKE 1 TABLET BY MOUTH EVERY DAY 30 tablet 8  . torsemide (DEMADEX) 20 MG tablet Take 40 mg by mouth daily.    . traZODone (DESYREL) 100 MG tablet TAKE 1 TABLET BY MOUTH EVERYDAY AT BEDTIME 90 tablet 16   No current facility-administered medications on file prior to visit.    Social History    Tobacco Use  . Smoking status: Former Smoker    Packs/day: 1.50    Years: 24.00    Pack years: 36.00    Types: Cigarettes    Quit date: 1993    Years since quitting: 28.3  . Smokeless tobacco: Never Used  . Tobacco comment: quit 1995.   Substance Use Topics  . Alcohol use: Yes    Alcohol/week: 14.0 standard drinks    Types: 14 Glasses of wine per week    Comment: occ  . Drug use: No    Review of Systems  Constitutional: Negative for chills and fever.  Respiratory: Negative for cough and shortness of breath.   Cardiovascular: Negative for chest pain, palpitations and leg swelling.  Gastrointestinal: Positive for abdominal distention, abdominal pain and constipation. Negative for diarrhea, nausea and vomiting.  Skin: Positive for wound.  Neurological: Negative for dizziness and headaches.      Objective:    BP (!) 150/70 (BP Location: Left Arm, Patient Position: Sitting, Cuff Size: Large)   Temp (!) 95.7 F (35.4 C) (Temporal)   Ht 5\' 1"  (1.549 m)   Wt 238 lb 3.2 oz (108 kg)   SpO2 96%   BMI 45.01 kg/m  BP Readings from Last 3 Encounters:  05/22/19 (!) 150/70  04/09/19 130/76  03/06/19 120/70   Wt Readings from Last 3 Encounters:  05/22/19 238 lb 3.2 oz (108 kg)  04/09/19 234 lb (106.1 kg)  03/06/19 232 lb (105.2 kg)    Physical Exam Vitals reviewed.  Constitutional:      Appearance: She is well-developed.  Eyes:     Conjunctiva/sclera: Conjunctivae normal.  Cardiovascular:     Rate and Rhythm: Normal rate and regular rhythm.     Pulses: Normal pulses.     Heart sounds: Normal heart sounds.  Pulmonary:     Effort: Pulmonary effort is normal.     Breath sounds: Normal breath sounds. No wheezing, rhonchi or rales.  Abdominal:     Comments: Deferred abdominal exam due diffuse bandages.  Skin:    General: Skin is warm and dry.  Neurological:     Mental Status: She is alert.  Psychiatric:        Speech: Speech normal.        Behavior: Behavior normal.         Thought Content: Thought content normal.        Assessment & Plan:   Problem List Items Addressed This Visit      Cardiovascular and Mediastinum   Essential hypertension    Elevated today.  Patient's been off losartan for several days and just started again yesterday.  Advised against amlodipine due to history of lower extremity edema.  I suspect that blood pressure will continue to improve as pain postoperatively improves and she continues losartan.  Patient continue monitor blood pressure throughout the next couple days and advise if she does not see a gradual decrease in blood pressure to call the office and let me know as you might increase losartan.  I want to be careful as it appears per patient in the hospital that she had some lower blood pressures (although they are unable to see hospital discharge or blood pressures while admitted).  close follow-up      Relevant Medications   ELIQUIS 2.5 MG TABS tablet     Other   Constipation    Concern as last BM 4 days ago.  Patient is passing gas.  She is nontoxic in appearance.  Advised her if doesn't have BM  In the next day when starting milk of magnesia, Colace, call the office immediately so I can order Ab XR.  Also advised her to call her surgeon's office as she is on a liquid diet at this time and I would like anticipatory guidance in regards to how often she should be having a bowel movement while on this diet.          I have discontinued Tawsha Ikner's metFORMIN. I am also having her maintain her aspirin, cholecalciferol, hydrocortisone, azelastine, Multi-Vitamin, rosuvastatin, traZODone, fexofenadine, losartan, potassium chloride, fluticasone, torsemide, gabapentin, omeprazole, levothyroxine, acyclovir, Eliquis, and ondansetron.   No orders of the defined types were placed in this encounter.   Return precautions given.   Risks, benefits, and alternatives of the medications and treatment plan prescribed today  were discussed, and patient expressed understanding.   Education regarding symptom management and diagnosis given to patient on AVS.  Continue to follow with [name], FNP for routine health maintenance.   Jennifer [name] and I agreed with plan.   [name], FNP

## 2019-05-22 NOTE — Progress Notes (Signed)
I spoke with patient & she will call me tomorrow before lunch to let me know if she has had a BM. She took some milk of magnesia as well as two stool softeners.

## 2019-05-22 NOTE — Assessment & Plan Note (Signed)
Elevated today.  Patient's been off losartan for several days and just started again yesterday.  Advised against amlodipine due to history of lower extremity edema.  I suspect that blood pressure will continue to improve as pain postoperatively improves and she continues losartan.  Patient continue monitor blood pressure throughout the next couple days and advise if she does not see a gradual decrease in blood pressure to call the office and let me know as you might increase losartan.  I want to be careful as it appears per patient in the hospital that she had some lower blood pressures (although they are unable to see hospital discharge or blood pressures while admitted).  close follow-up

## 2019-05-23 ENCOUNTER — Telehealth: Payer: Self-pay | Admitting: Family

## 2019-05-23 NOTE — Telephone Encounter (Signed)
Just FYI with stool softener & milk of magnesia patient did have BM this morning.

## 2019-05-23 NOTE — Telephone Encounter (Signed)
Pt called and asked to speak with you but would not disclose why

## 2019-05-24 ENCOUNTER — Emergency Department: Payer: Medicare Other

## 2019-05-24 ENCOUNTER — Other Ambulatory Visit: Payer: Self-pay

## 2019-05-24 ENCOUNTER — Emergency Department
Admission: EM | Admit: 2019-05-24 | Discharge: 2019-05-25 | Disposition: A | Discharge status: Other Acute Inpatient Hospital | Payer: Medicare Other | Attending: Emergency Medicine | Admitting: Emergency Medicine

## 2019-05-24 ENCOUNTER — Encounter: Payer: Self-pay | Admitting: Emergency Medicine

## 2019-05-24 DIAGNOSIS — Z79899 Other long term (current) drug therapy: Secondary | ICD-10-CM | POA: Insufficient documentation

## 2019-05-24 DIAGNOSIS — G8918 Other acute postprocedural pain: Secondary | ICD-10-CM

## 2019-05-24 DIAGNOSIS — Z87891 Personal history of nicotine dependence: Secondary | ICD-10-CM | POA: Diagnosis not present

## 2019-05-24 DIAGNOSIS — K56609 Unspecified intestinal obstruction, unspecified as to partial versus complete obstruction: Secondary | ICD-10-CM | POA: Diagnosis not present

## 2019-05-24 DIAGNOSIS — E039 Hypothyroidism, unspecified: Secondary | ICD-10-CM | POA: Insufficient documentation

## 2019-05-24 DIAGNOSIS — Z7982 Long term (current) use of aspirin: Secondary | ICD-10-CM | POA: Insufficient documentation

## 2019-05-24 DIAGNOSIS — K6389 Other specified diseases of intestine: Secondary | ICD-10-CM | POA: Diagnosis not present

## 2019-05-24 DIAGNOSIS — Z4682 Encounter for fitting and adjustment of non-vascular catheter: Secondary | ICD-10-CM | POA: Diagnosis not present

## 2019-05-24 DIAGNOSIS — R1030 Lower abdominal pain, unspecified: Secondary | ICD-10-CM | POA: Diagnosis not present

## 2019-05-24 DIAGNOSIS — Z950 Presence of cardiac pacemaker: Secondary | ICD-10-CM | POA: Insufficient documentation

## 2019-05-24 DIAGNOSIS — Z20822 Contact with and (suspected) exposure to covid-19: Secondary | ICD-10-CM | POA: Insufficient documentation

## 2019-05-24 DIAGNOSIS — K56699 Other intestinal obstruction unspecified as to partial versus complete obstruction: Secondary | ICD-10-CM | POA: Diagnosis not present

## 2019-05-24 DIAGNOSIS — I10 Essential (primary) hypertension: Secondary | ICD-10-CM | POA: Diagnosis not present

## 2019-05-24 DIAGNOSIS — Z452 Encounter for adjustment and management of vascular access device: Secondary | ICD-10-CM | POA: Diagnosis not present

## 2019-05-24 DIAGNOSIS — R140 Abdominal distension (gaseous): Secondary | ICD-10-CM | POA: Diagnosis present

## 2019-05-24 DIAGNOSIS — Z03818 Encounter for observation for suspected exposure to other biological agents ruled out: Secondary | ICD-10-CM | POA: Diagnosis not present

## 2019-05-24 LAB — CBC WITH DIFFERENTIAL/PLATELET
Abs Immature Granulocytes: 0.04 10*3/uL (ref 0.00–0.07)
Basophils Absolute: 0.1 10*3/uL (ref 0.0–0.1)
Basophils Relative: 1 %
Eosinophils Absolute: 0 10*3/uL (ref 0.0–0.5)
Eosinophils Relative: 0 %
HCT: 41.5 % (ref 36.0–46.0)
Hemoglobin: 13.2 g/dL (ref 12.0–15.0)
Immature Granulocytes: 1 %
Lymphocytes Relative: 14 %
Lymphs Abs: 1.1 10*3/uL (ref 0.7–4.0)
MCH: 26 pg (ref 26.0–34.0)
MCHC: 31.8 g/dL (ref 30.0–36.0)
MCV: 81.9 fL (ref 80.0–100.0)
Monocytes Absolute: 0.6 10*3/uL (ref 0.1–1.0)
Monocytes Relative: 9 %
Neutro Abs: 5.7 10*3/uL (ref 1.7–7.7)
Neutrophils Relative %: 75 %
Platelets: 251 10*3/uL (ref 150–400)
RBC: 5.07 MIL/uL (ref 3.87–5.11)
RDW: 15.9 % — ABNORMAL HIGH (ref 11.5–15.5)
WBC: 7.6 10*3/uL (ref 4.0–10.5)
nRBC: 0 % (ref 0.0–0.2)

## 2019-05-24 LAB — LACTIC ACID, PLASMA: Lactic Acid, Venous: 1.1 mmol/L (ref 0.5–1.9)

## 2019-05-24 LAB — COMPREHENSIVE METABOLIC PANEL
ALT: 24 U/L (ref 0–44)
AST: 25 U/L (ref 15–41)
Albumin: 3.5 g/dL (ref 3.5–5.0)
Alkaline Phosphatase: 57 U/L (ref 38–126)
Anion gap: 10 (ref 5–15)
BUN: 12 mg/dL (ref 8–23)
CO2: 28 mmol/L (ref 22–32)
Calcium: 8.8 mg/dL — ABNORMAL LOW (ref 8.9–10.3)
Chloride: 100 mmol/L (ref 98–111)
Creatinine, Ser: 0.71 mg/dL (ref 0.44–1.00)
GFR calc Af Amer: >60 mL/min (ref >60)
GFR calc non Af Amer: >60 mL/min (ref >60)
Glucose, Bld: 120 mg/dL — ABNORMAL HIGH (ref 70–99)
Potassium: 3.8 mmol/L (ref 3.5–5.1)
Sodium: 138 mmol/L (ref 135–145)
Total Bilirubin: 0.9 mg/dL (ref 0.3–1.2)
Total Protein: 6.9 g/dL (ref 6.5–8.1)

## 2019-05-24 MED ORDER — MORPHINE SULFATE (PF) 4 MG/ML IV SOLN
4.0000 mg | Freq: Once | INTRAVENOUS | Status: AC
  Administered 2019-05-25: 4 mg via INTRAVENOUS
  Filled 2019-05-24: qty 1

## 2019-05-24 MED ORDER — ONDANSETRON HCL 4 MG/2ML IJ SOLN
4.0000 mg | Freq: Once | INTRAMUSCULAR | Status: AC
  Administered 2019-05-24: 18:00:00 4 mg via INTRAVENOUS
  Filled 2019-05-24: qty 2

## 2019-05-24 MED ORDER — MORPHINE SULFATE (PF) 4 MG/ML IV SOLN
4.0000 mg | Freq: Once | INTRAVENOUS | Status: AC
  Administered 2019-05-24: 18:00:00 4 mg via INTRAVENOUS
  Filled 2019-05-24: qty 1

## 2019-05-24 MED ORDER — IOHEXOL 9 MG/ML PO SOLN
500.0000 mL | Freq: Once | ORAL | Status: AC
  Administered 2019-05-24: 17:00:00 500 mL via ORAL

## 2019-05-24 MED ORDER — ONDANSETRON HCL 4 MG/2ML IJ SOLN
4.0000 mg | Freq: Once | INTRAMUSCULAR | Status: AC
  Administered 2019-05-24: 4 mg via INTRAVENOUS
  Filled 2019-05-24: qty 2

## 2019-05-24 MED ORDER — IOHEXOL 300 MG/ML  SOLN
100.0000 mL | Freq: Once | INTRAMUSCULAR | Status: AC | PRN
  Administered 2019-05-24: 100 mL via INTRAVENOUS

## 2019-05-24 NOTE — ED Provider Notes (Addendum)
-----------------------------------------   11:25 PM on 05/24/2019 -----------------------------------------  Assuming care from Dr. Joni Fears.  In short, Jennifer [name] is a 69 y.o. female with a chief complaint of abdominal pain s/p recent bariatric surgery by Dr. Darnell Level.  Refer to the original H&P for additional details.  The current plan of care is to discuss case with Dr. Darnell Level or his team to discuss disposition.  ----------------------------------------- 12:03 AM on 05/25/2019 -----------------------------------------  Discussed case by phone with the mid-level working with Dr. Darnell Level.  She staffed with her attending and they will accept the patient as a transfer.  NG tube was placed prior to my involvement with the case, but placement appears appropriate.  Giving another morphine 4 mg IV for the patient's severe pain.    ----------------------------------------- 1:19 AM on 05/25/2019 -----------------------------------------  COVID test pending.  Updated patient and family member who is at bedside.  Giving another morphine 4 mg IV for her persistent pain.  Awaiting bed assignment and transportation once COVID result is back.   ----------------------------------------- 2:11 AM on 05/25/2019 -----------------------------------------  No EMS transport will be available until the morning.  I ordered NS 100 mL/hr MIVF infusion.  We will continue to look after the patient until transport is available.     ----------------------------------------- 2:16 AM on 05/25/2019 -----------------------------------------  Patient is still somewhat nauseated and in pain as well as anxious over the situation and having the NG tube.  I verified on prior EKGs that she has no history of QTC prolongation.  I am giving her a dose of droperidol 2.5 mg IV which I think will help a lot in terms of a calming agent as well as an antiemetic and help with the ongoing pain.   Hinda Kehr, MD 05/25/19  [phone]

## 2019-05-24 NOTE — ED Provider Notes (Signed)
Procedures     ----------------------------------------- 10:54 PM on 05/24/2019 ----------------------------------------- CT scan shows small bowel obstruction.  Vital signs stable.  Will reach out to her bariatric surgeon, Dr. Darnell Level to see if transfer is desired from their perspective.  Otherwise, patient can be cared for here at [facility] and was admitted locally, which patient is agreeable to.  Plan to insert NG tube.     Carrie Mew, MD 05/24/19 2255

## 2019-05-24 NOTE — ED Triage Notes (Signed)
Pt via pov from home with chills, sweats, nausea, constipation, inability to eat, and swollen genitalia. Pt states she had bariatric surgery - duodenal switch- last Thursday. She was released from the hospital on Monday, felt a bit better on Tuesday, and began to feel bad again on Wednesday. She states she has called the surgeon's office with no return call. Pt alert & oriented, NAD noted. She reports generalized pain to abdomen, but states the pain is about the same as it has been all along.

## 2019-05-24 NOTE — ED Triage Notes (Addendum)
First nurse note- pt had duodenal switch at [facility] and now having difficulty with bowel movements, chills, genital swelling. Pt tried calling surgeon X 3 but they have not returned her phone call.

## 2019-05-24 NOTE — ED Notes (Signed)
Report given to Rebecca L RN 

## 2019-05-24 NOTE — ED Provider Notes (Signed)
[facility] Emergency Department Provider Note   ____________________________________________    I have reviewed the triage vital signs and the nursing notes.   HISTORY  Chief Complaint Post-op Problem     HPI Jennifer [name] is a 69 y.o. female with a history as noted below who presents postop day 8 status post bariatric surgery with Dr. Darnell Level at [facility].  Patient reports increased bloating, difficulty passing gas and having stool.  No flatus or stool since very small bowel movement overnight yesterday.  Has been taking pain medication which does help temporarily.  Denies fevers or chills.  Positive nausea.   Past Medical History:  Diagnosis Date  . Anxiety   . Constipation   . Elevated liver enzymes   . Hemorrhoids   . Herpes genitalis   . High cholesterol   . Hyperlipidemia   . Hypertension   . Hypothyroidism   . Osteoarthritis   . Sleep apnea     Patient Active Problem List   Diagnosis Date Noted  . Neck pain 04/09/2019  . Pacemaker 02/01/2019  . Chronic venous insufficiency 05/24/2018  . Lymphedema 05/24/2018  . Leg swelling 05/09/2018  . Peripheral neuropathy 01/24/2018  . Insomnia 07/12/2017  . Constipation 12/15/2016  . Hemorrhoids 12/15/2016  . Hypothyroidism 10/03/2016  . Bradycardia 06/16/2016  . Elevated liver enzymes 04/06/2016  . OSA (obstructive sleep apnea) 04/06/2016  . Fatty liver 12/08/2015  . Arthritis 12/08/2015  . History of bariatric surgery 12/07/2015  . Genital herpes 11/11/2015  . Hyperlipidemia 11/11/2015  . Essential hypertension 11/11/2015  . Routine physical examination 11/11/2015  . Allergic rhinitis 11/11/2015  . GERD (gastroesophageal reflux disease) 11/11/2015  . Impaired fasting glucose 07/17/2012  . Obesity, unspecified 07/17/2012    Past Surgical History:  Procedure Laterality Date  . ABDOMINAL HYSTERECTOMY     total for fibroids no h/o abnormal pap  . bariatric sleeve  2015  .  BREAST EXCISIONAL BIOPSY Left 1998  . carpal tunnel repair    . COLONOSCOPY WITH PROPOFOL N/A 02/11/2016   Procedure: COLONOSCOPY WITH PROPOFOL;  Surgeon: Jonathon Bellows, MD;  Location: [facility] ENDOSCOPY;  Service: Endoscopy;  Laterality: N/A;  . HEMORRHOID SURGERY      Prior to Admission medications   Medication Sig Start Date End Date Taking? Authorizing Provider  acyclovir (ZOVIRAX) 400 MG tablet TAKE 1 TABLET BY MOUTH EVERY DAY 05/06/19   [name], FNP  aspirin 81 MG tablet Take 81 mg by mouth daily.    [provider]  azelastine (ASTELIN) 0.1 % nasal spray USE 1 SPRAY EACH NOSTRIL TWICE A DAY AS NEEDED FOR ALLERGIES 08/09/18   [provider]  cholecalciferol (VITAMIN D) 1000 UNITS tablet Take 400 Units by mouth daily.     [provider]  ELIQUIS 2.5 MG TABS tablet Take 2.5 mg by mouth 2 (two) times daily. 04/17/19   [provider]  fexofenadine (ALLEGRA) 180 MG tablet Take 1 tablet (180 mg total) by mouth daily. 01/14/19   [name], FNP  fluticasone (FLONASE) 50 MCG/ACT nasal spray PLACE 1 SPRAY INTO BOTH NOSTRILS DAILY. 02/11/19   [name], FNP  gabapentin (NEURONTIN) 100 MG capsule Take 2 capsules (200 mg total) by mouth 3 (three) times daily. 04/09/19   [name], FNP  hydrocortisone (ANUSOL-HC) 25 MG suppository Place 1 suppository (25 mg total) rectally 2 (two) times daily as needed for hemorrhoids or anal itching. 03/27/17   [name], FNP  levothyroxine (  SYNTHROID) 50 MCG tablet TAKE 1 TABLET BY MOUTH EVERY DAY 05/06/19   [name], FNP  losartan (COZAAR) 50 MG tablet Take by mouth. 12/25/18   [provider]  Multiple Vitamin (MULTI-VITAMIN) tablet Take by mouth.    [provider]  omeprazole (PRILOSEC) 20 MG capsule TAKE 1 CAPSULE BY MOUTH EVERY DAY 04/24/19   [name], FNP  ondansetron (ZOFRAN-ODT) 4 MG disintegrating tablet Take 4 mg by mouth every 8 (eight) hours as  needed. 04/17/19   [provider]  potassium chloride (KLOR-CON) 10 MEQ tablet Take 1 tablet by mouth 3 (three) times a week. 01/14/19   [provider]  rosuvastatin (CRESTOR) 40 MG tablet TAKE 1 TABLET BY MOUTH EVERY DAY 11/05/18   [name], FNP  torsemide (DEMADEX) 20 MG tablet Take 40 mg by mouth daily. 04/03/19   [provider]  traZODone (DESYREL) 100 MG tablet TAKE 1 TABLET BY MOUTH EVERYDAY AT BEDTIME 11/12/18   [name], FNP     Allergies Celecoxib, Pollen extract, Nasacort [triamcinolone], and Vicodin [hydrocodone-acetaminophen]  Family History  Problem Relation Age of Onset  . Breast cancer Sister 90       materal 1/2 sister  . Hypertension Sister   . Hypertension Mother   . Heart disease Father   . Hypertension Brother     Social History Social History   Tobacco Use  . Smoking status: Former Smoker    Packs/day: 1.50    Years: 24.00    Pack years: 36.00    Types: Cigarettes    Quit date: 1993    Years since quitting: 28.3  . Smokeless tobacco: Never Used  . Tobacco comment: quit 1995.   Substance Use Topics  . Alcohol use: Not Currently  . Drug use: No    Review of Systems  Constitutional: No fever/chills Eyes: No visual changes.  ENT: No sore throat. Cardiovascular: Denies chest pain. Respiratory: Denies shortness of breath. Gastrointestinal: As above Genitourinary: Negative for dysuria. Musculoskeletal: Negative for back pain. Skin: Negative for rash. Neurological: Negative for headaches    ____________________________________________   PHYSICAL EXAM:  VITAL SIGNS: ED Triage Vitals  Enc Vitals Group     BP 05/24/19 1412 (!) 157/76     Pulse Rate 05/24/19 1412 76     Resp 05/24/19 1412 18     Temp 05/24/19 1412 97.6 F (36.4 C)     Temp Source 05/24/19 1412 Oral     SpO2 05/24/19 1412 93 %     Weight 05/24/19 1413 104.3 kg (230 lb)     Height 05/24/19 1413 1.549 m (5\' 1" )     Head  Circumference --      Peak Flow --      Pain Score 05/24/19 1412 6     Pain Loc --      Pain Edu? --      Excl. in [place]? --     Constitutional: Alert and oriented.   Nose: No congestion/rhinnorhea. Mouth/Throat: Mucous membranes are moist.    Cardiovascular: Normal rate, regular rhythm.   Good peripheral circulation. Respiratory: Normal respiratory effort.  No retractions.  Gastrointestinal: Patient abdomen is soft but distended, laparoscopic incision sites did not demonstrate any infection, small amount of dehiscence noted. Musculoskeletal: Warm and well perfused Neurologic:  Normal speech and language. No gross focal neurologic deficits are appreciated.  Skin:  Skin is warm, dry and intact. No rash noted. Psychiatric: Mood and affect are normal. Speech  and behavior are normal.  ____________________________________________   LABS (all labs ordered are listed, but only abnormal results are displayed)  Labs Reviewed  COMPREHENSIVE METABOLIC PANEL - Abnormal; Notable for the following components:      Result Value   Glucose, Bld 120 (*)    Calcium 8.8 (*)    All other components within normal limits  CBC WITH DIFFERENTIAL/PLATELET - Abnormal; Notable for the following components:   RDW 15.9 (*)    All other components within normal limits  LACTIC ACID, PLASMA  URINALYSIS, COMPLETE (UACMP) WITH MICROSCOPIC   ____________________________________________  EKG  None ____________________________________________  RADIOLOGY  CT abdomen pelvis pending ____________________________________________   PROCEDURES  Procedure(s) performed: No  Procedures   Critical Care performed:No ____________________________________________   INITIAL IMPRESSION / ASSESSMENT AND PLAN / ED COURSE  Pertinent labs & imaging results that were available during my care of the patient were reviewed by me and considered in my medical decision making (see chart for details).  Patient with  recent bariatric surgery.  Presents with bloating, abdominal pain.  Deferential includes ileus, obstruction, surgical infection, constipation.  Will obtain CT imaging.  Thus far lab work is quite reassuring white blood cell count is normal.  Lactic acid is normal.  Patient remains comfortable after IV morphine and IV Zofran.  Pending CT scan. I have asked Dr. Joni Fears to follow-up on CT results    ____________________________________________   FINAL CLINICAL IMPRESSION(S) / ED DIAGNOSES  Final diagnoses:  Post-operative pain  Lower abdominal pain        Note:  This document was prepared using Dragon voice recognition software and may include unintentional dictation errors.   Lavonia Drafts, MD 05/24/19 2027

## 2019-05-24 NOTE — ED Notes (Signed)
Patient is calm, denies discomfort at this time . Husband at bedside.

## 2019-05-24 NOTE — ED Notes (Signed)
Patient taken to CT scan.

## 2019-05-25 DIAGNOSIS — Z833 Family history of diabetes mellitus: Secondary | ICD-10-CM | POA: Diagnosis not present

## 2019-05-25 DIAGNOSIS — K5989 Other specified functional intestinal disorders: Secondary | ICD-10-CM | POA: Diagnosis not present

## 2019-05-25 DIAGNOSIS — K567 Ileus, unspecified: Secondary | ICD-10-CM | POA: Diagnosis present

## 2019-05-25 DIAGNOSIS — Z7982 Long term (current) use of aspirin: Secondary | ICD-10-CM | POA: Diagnosis not present

## 2019-05-25 DIAGNOSIS — Z8249 Family history of ischemic heart disease and other diseases of the circulatory system: Secondary | ICD-10-CM | POA: Diagnosis not present

## 2019-05-25 DIAGNOSIS — Z4682 Encounter for fitting and adjustment of non-vascular catheter: Secondary | ICD-10-CM | POA: Diagnosis not present

## 2019-05-25 DIAGNOSIS — K56609 Unspecified intestinal obstruction, unspecified as to partial versus complete obstruction: Secondary | ICD-10-CM | POA: Diagnosis not present

## 2019-05-25 DIAGNOSIS — G8918 Other acute postprocedural pain: Secondary | ICD-10-CM | POA: Diagnosis not present

## 2019-05-25 DIAGNOSIS — Z03818 Encounter for observation for suspected exposure to other biological agents ruled out: Secondary | ICD-10-CM | POA: Diagnosis not present

## 2019-05-25 DIAGNOSIS — K760 Fatty (change of) liver, not elsewhere classified: Secondary | ICD-10-CM | POA: Diagnosis present

## 2019-05-25 DIAGNOSIS — K6389 Other specified diseases of intestine: Secondary | ICD-10-CM | POA: Diagnosis not present

## 2019-05-25 DIAGNOSIS — Z90710 Acquired absence of both cervix and uterus: Secondary | ICD-10-CM | POA: Diagnosis not present

## 2019-05-25 DIAGNOSIS — I10 Essential (primary) hypertension: Secondary | ICD-10-CM | POA: Diagnosis present

## 2019-05-25 DIAGNOSIS — Z9884 Bariatric surgery status: Secondary | ICD-10-CM | POA: Diagnosis not present

## 2019-05-25 DIAGNOSIS — G629 Polyneuropathy, unspecified: Secondary | ICD-10-CM | POA: Diagnosis present

## 2019-05-25 DIAGNOSIS — K219 Gastro-esophageal reflux disease without esophagitis: Secondary | ICD-10-CM | POA: Diagnosis present

## 2019-05-25 DIAGNOSIS — I739 Peripheral vascular disease, unspecified: Secondary | ICD-10-CM | POA: Diagnosis present

## 2019-05-25 DIAGNOSIS — Z79899 Other long term (current) drug therapy: Secondary | ICD-10-CM | POA: Diagnosis not present

## 2019-05-25 DIAGNOSIS — Z20822 Contact with and (suspected) exposure to covid-19: Secondary | ICD-10-CM | POA: Diagnosis not present

## 2019-05-25 DIAGNOSIS — R0602 Shortness of breath: Secondary | ICD-10-CM | POA: Diagnosis not present

## 2019-05-25 DIAGNOSIS — E039 Hypothyroidism, unspecified: Secondary | ICD-10-CM | POA: Diagnosis present

## 2019-05-25 DIAGNOSIS — K56699 Other intestinal obstruction unspecified as to partial versus complete obstruction: Secondary | ICD-10-CM | POA: Diagnosis not present

## 2019-05-25 DIAGNOSIS — R1030 Lower abdominal pain, unspecified: Secondary | ICD-10-CM | POA: Diagnosis not present

## 2019-05-25 LAB — RESPIRATORY PANEL BY RT PCR (FLU A&B, COVID)
Influenza A by PCR: NEGATIVE
Influenza B by PCR: NEGATIVE
SARS Coronavirus 2 by RT PCR: NEGATIVE

## 2019-05-25 MED ORDER — ONDANSETRON HCL 4 MG/2ML IJ SOLN
4.0000 mg | Freq: Once | INTRAMUSCULAR | Status: AC
  Administered 2019-05-25: 09:00:00 4 mg via INTRAVENOUS
  Filled 2019-05-25: qty 2

## 2019-05-25 MED ORDER — SODIUM CHLORIDE 0.9 % IV SOLN: Freq: Once | INTRAVENOUS | Status: AC

## 2019-05-25 MED ORDER — DROPERIDOL 2.5 MG/ML IJ SOLN
2.5000 mg | Freq: Once | INTRAMUSCULAR | Status: AC
  Administered 2019-05-25: 02:00:00 2.5 mg via INTRAVENOUS
  Filled 2019-05-25: qty 2

## 2019-05-25 MED ORDER — MORPHINE SULFATE (PF) 4 MG/ML IV SOLN
4.0000 mg | INTRAVENOUS | Status: DC | PRN
  Administered 2019-05-25: 06:00:00 4 mg via INTRAVENOUS
  Filled 2019-05-25: qty 1

## 2019-05-25 MED ORDER — MORPHINE SULFATE (PF) 4 MG/ML IV SOLN
4.0000 mg | Freq: Once | INTRAVENOUS | Status: AC
  Administered 2019-05-25: 09:00:00 4 mg via INTRAVENOUS
  Filled 2019-05-25: qty 1

## 2019-05-25 NOTE — ED Notes (Signed)
emtala reviewed by this RN 

## 2019-05-25 NOTE — ED Provider Notes (Signed)
Patient is resting no she does report her pain is starting to return.  She is fully awake and alert.  She is aware and agreeable with plan for transfer.  Transfer ambulance is expected to arrive within about 20 to 30 minutes.  Vitals:   05/25/19 0645 05/25/19 0730  BP: 113/69 113/68  Pulse: 74 69  Resp: 17   Temp:    SpO2: 93% 92%    She is fully alert without distress.  She appears stable for transfer   Delman Kitten, MD 05/25/19 [phone]

## 2019-05-25 NOTE — ED Notes (Signed)
Jennifer [name] Rep: Jana Half [phone] Bed Assigned 5 [place] Rm 06 Call report to [phone] after 7am

## 2019-05-25 NOTE — ED Notes (Signed)
Spoke with [employer] transportation. States they will be here in approx and hour.

## 2019-05-25 NOTE — ED Notes (Addendum)
Husband Juanda Crumble requests that he be called when pt leaves for [facility].  His number is [phone].  He is leaving the pt with her ID, cell phone and charger which are on the bedside table, and he took the rest of her belongings with him.

## 2019-05-26 MED ORDER — ONDANSETRON HCL 4 MG/2ML IJ SOLN
4.00 | INTRAMUSCULAR | Status: DC
Start: ? — End: 2019-05-26

## 2019-05-26 MED ORDER — HYDROMORPHONE HCL 1 MG/ML IJ SOLN
0.50 | INTRAMUSCULAR | Status: DC
Start: ? — End: 2019-05-26

## 2019-05-26 MED ORDER — GENERIC EXTERNAL MEDICATION
6.25 | Status: DC
Start: ? — End: 2019-05-26

## 2019-05-26 MED ORDER — KCL IN DEXTROSE-NACL 20-5-0.45 MEQ/L-%-% IV SOLN
75.00 | INTRAVENOUS | Status: DC
Start: ? — End: 2019-05-26

## 2019-05-28 ENCOUNTER — Encounter: Payer: Self-pay | Admitting: Family

## 2019-05-28 ENCOUNTER — Encounter: Payer: Medicare Other | Admitting: Family

## 2019-05-28 MED ORDER — LACTULOSE 10 GM/15ML PO SOLN
20.00 | ORAL | Status: DC
Start: 2019-05-31 — End: 2019-05-28

## 2019-05-28 MED ORDER — ENOXAPARIN SODIUM 40 MG/0.4ML ~~LOC~~ SOLN
40.00 | SUBCUTANEOUS | Status: DC
Start: 2019-05-31 — End: 2019-05-28

## 2019-05-28 MED ORDER — SENNOSIDES 8.6 MG PO TABS
2.00 | ORAL_TABLET | ORAL | Status: DC
Start: 2019-05-31 — End: 2019-05-28

## 2019-05-28 MED ORDER — SIMETHICONE 80 MG PO CHEW
160.00 | CHEWABLE_TABLET | ORAL | Status: DC
Start: ? — End: 2019-05-28

## 2019-05-30 MED ORDER — DEXAMETHASONE SODIUM PHOSPHATE 4 MG/ML IJ SOLN
8.00 | INTRAMUSCULAR | Status: DC
Start: 2019-05-30 — End: 2019-05-30

## 2019-05-30 MED ORDER — TRAZODONE HCL 50 MG PO TABS
100.00 | ORAL_TABLET | ORAL | Status: DC
Start: ? — End: 2019-05-30

## 2019-05-30 MED ORDER — GABAPENTIN 250 MG/5ML PO SOLN
200.00 | ORAL | Status: DC
Start: 2019-05-31 — End: 2019-05-30

## 2019-05-30 MED ORDER — LEVOTHYROXINE SODIUM 50 MCG PO TABS
50.00 | ORAL_TABLET | ORAL | Status: DC
Start: 2019-05-31 — End: 2019-05-30

## 2019-05-30 MED ORDER — HYDROCODONE-ACETAMINOPHEN 7.5-325 MG/15ML PO SOLN
10.00 | ORAL | Status: DC
Start: ? — End: 2019-05-30

## 2019-05-30 MED ORDER — ONDANSETRON 4 MG PO TBDP
4.00 | ORAL_TABLET | ORAL | Status: DC
Start: ? — End: 2019-05-30

## 2019-05-30 MED ORDER — DIPHENHYDRAMINE HCL 50 MG/ML IJ SOLN
25.00 | INTRAMUSCULAR | Status: DC
Start: ? — End: 2019-05-30

## 2019-05-30 MED ORDER — PROMETHAZINE HCL 6.25 MG/5ML PO SYRP
12.50 | ORAL_SOLUTION | ORAL | Status: DC
Start: ? — End: 2019-05-30

## 2019-05-30 NOTE — Progress Notes (Signed)
This encounter was created in error - please disregard.

## 2019-06-06 DIAGNOSIS — Z723 Lack of physical exercise: Secondary | ICD-10-CM | POA: Diagnosis not present

## 2019-06-06 DIAGNOSIS — Z9884 Bariatric surgery status: Secondary | ICD-10-CM | POA: Diagnosis not present

## 2019-06-06 DIAGNOSIS — Z713 Dietary counseling and surveillance: Secondary | ICD-10-CM | POA: Diagnosis not present

## 2019-06-07 ENCOUNTER — Telehealth: Payer: Self-pay

## 2019-06-07 ENCOUNTER — Telehealth: Payer: Self-pay | Admitting: Pulmonary Disease

## 2019-06-07 NOTE — Telephone Encounter (Signed)
Called and spoke with patient about her machine. Asked her to call [employer] or [facility] where she got the machine from. Phone numbers were provided to patient. She expressed understanding. Nothing further needed at this time.

## 2019-06-07 NOTE — Telephone Encounter (Signed)
Called to check on patient and verify keeping hospital follow up at [place] next week for recent hospital discharge.  Confirms she is doing well with no ongoing symptoms and scheduled appointment. Declines hfu needed at this time with  with PMD. Encouraged to notify the office if symptoms worsen and as needed.

## 2019-06-19 DIAGNOSIS — Z6838 Body mass index (BMI) 38.0-38.9, adult: Secondary | ICD-10-CM | POA: Insufficient documentation

## 2019-06-24 ENCOUNTER — Telehealth: Payer: Self-pay | Admitting: Family

## 2019-06-24 NOTE — Telephone Encounter (Signed)
Pt would like to know if she needs shingles shot and has concerns about aspirin

## 2019-06-25 NOTE — Telephone Encounter (Signed)
Patient was called and she stated that her arm is in pain from a IV placement when  she was in the hospital she is scheduled to see Jennifer [name] tomorrow to be evaluated.  Damen Windsor,cma

## 2019-06-26 ENCOUNTER — Other Ambulatory Visit: Payer: Self-pay

## 2019-06-26 ENCOUNTER — Telehealth: Payer: Self-pay | Admitting: Nurse Practitioner

## 2019-06-26 ENCOUNTER — Ambulatory Visit (INDEPENDENT_AMBULATORY_CARE_PROVIDER_SITE_OTHER): Payer: Medicare Other | Admitting: Nurse Practitioner

## 2019-06-26 ENCOUNTER — Encounter: Payer: Self-pay | Admitting: Nurse Practitioner

## 2019-06-26 VITALS — BP 138/72 | HR 62 | Temp 97.8°F | Ht 61.0 in | Wt 207.0 lb

## 2019-06-26 DIAGNOSIS — I809 Phlebitis and thrombophlebitis of unspecified site: Secondary | ICD-10-CM | POA: Insufficient documentation

## 2019-06-26 DIAGNOSIS — I808 Phlebitis and thrombophlebitis of other sites: Secondary | ICD-10-CM

## 2019-06-26 NOTE — Progress Notes (Addendum)
Established Patient Office Visit  Subjective:  Patient ID: Jennifer [name], female    DOB: [birth year]  Age: 69 y.o. MRN: [id number]  CC:  Chief Complaint  Patient presents with  . Acute Visit    left arm pain    HPI Jennifer [name] presents for discomfort in the arm and vein firmness where her IV was placed after surgery 05/26/2019. She showed this to her surgeon who told her she had a blood clot in the vein. She is concerned about the clot travelling to her heart. She has some discomfort at the site and feels stiffness in the vein. She does not have any CP or SOB problems.  No redness, swelling, inflammation at the site.  Past Medical History:  Diagnosis Date  . Anxiety   . Constipation   . Elevated liver enzymes   . Hemorrhoids   . Herpes genitalis   . High cholesterol   . Hyperlipidemia   . Hypertension   . Hypothyroidism   . Osteoarthritis   . Sleep apnea     Past Surgical History:  Procedure Laterality Date  . ABDOMINAL HYSTERECTOMY     total for fibroids no h/o abnormal pap  . bariatric sleeve  2015  . BREAST EXCISIONAL BIOPSY Left 1998  . carpal tunnel repair    . COLONOSCOPY WITH PROPOFOL N/A 02/11/2016   Procedure: COLONOSCOPY WITH PROPOFOL;  Surgeon: Jonathon Bellows, MD;  Location: [facility] ENDOSCOPY;  Service: Endoscopy;  Laterality: N/A;  . HEMORRHOID SURGERY      Family History  Problem Relation Age of Onset  . Breast cancer Sister 17       materal 1/2 sister  . Hypertension Sister   . Hypertension Mother   . Heart disease Father   . Hypertension Brother     Social History   Socioeconomic History  . Marital status: Married    Spouse name: Not on file  . Number of children: Not on file  . Years of education: Not on file  . Highest education level: Not on file  Occupational History  . Not on file  Tobacco Use  . Smoking status: Former Smoker    Packs/day: 1.50    Years: 24.00    Pack years: 36.00    Types: Cigarettes    Quit date: 1993   Years since quitting: 28.4  . Smokeless tobacco: Never Used  . Tobacco comment: quit 1995.   Substance and Sexual Activity  . Alcohol use: Not Currently  . Drug use: No  . Sexual activity: Not Currently    Birth control/protection: Surgical    Comment: Hysterectomy  Other Topics Concern  . Not on file  Social History Narrative   Lives in [place].    Married.    Retired 2015, Interior and spatial designer.    One son; granddaughter.    Social Determinants of Health   Financial Resource Strain:   . Difficulty of Paying Living Expenses:   Food Insecurity:   . Worried About Charity fundraiser in the Last Year:   . Arboriculturist in the Last Year:   Transportation Needs:   . Film/video editor (Medical):   [name] Kitchen Lack of Transportation (Non-Medical):   Physical Activity: Unknown  . Days of Exercise per Week: 0 days  . Minutes of Exercise per Session: Not on file  Stress: No Stress Concern Present  . Feeling of Stress : Not at all  Social Connections:   . Frequency of Communication with Friends  and Family:   . Frequency of Social Gatherings with Friends and Family:   . Attends Religious Services:   . Active Member of Clubs or Organizations:   . Attends Archivist Meetings:   [name] Kitchen Marital Status:   Intimate Partner Violence:   . Fear of Current or Ex-Partner:   . Emotionally Abused:   [name] Kitchen Physically Abused:   . Sexually Abused:     Outpatient Medications Prior to Visit  Medication Sig Dispense Refill  . acetaminophen (TYLENOL) 500 MG tablet Take by mouth.    [name] Kitchen acyclovir (ZOVIRAX) 400 MG tablet TAKE 1 TABLET BY MOUTH EVERY DAY 30 tablet 5  . Ascorbic Acid (VITAMIN C) 1000 MG tablet Take 1,000 mg by mouth daily.    [name] Kitchen aspirin 81 MG tablet Take 81 mg by mouth daily.    [name] Kitchen azelastine (ASTELIN) 0.1 % nasal spray USE 1 SPRAY EACH NOSTRIL TWICE A DAY AS NEEDED FOR ALLERGIES    . fexofenadine (ALLEGRA) 180 MG tablet Take 1 tablet (180 mg total) by mouth daily. 30 tablet 3  .  fluticasone (FLONASE) 50 MCG/ACT nasal spray PLACE 1 SPRAY INTO BOTH NOSTRILS DAILY. 16 mL 2  . gabapentin (NEURONTIN) 100 MG capsule Take 2 capsules (200 mg total) by mouth 3 (three) times daily. 90 capsule 5  . hydrocortisone (ANUSOL-HC) 25 MG suppository Place 1 suppository (25 mg total) rectally 2 (two) times daily as needed for hemorrhoids or anal itching. 12 suppository 2  . levothyroxine (SYNTHROID) 50 MCG tablet TAKE 1 TABLET BY MOUTH EVERY DAY 30 tablet 5  . omeprazole (PRILOSEC) 20 MG capsule TAKE 1 CAPSULE BY MOUTH EVERY DAY 30 capsule 5  . traZODone (DESYREL) 100 MG tablet TAKE 1 TABLET BY MOUTH EVERYDAY AT BEDTIME 90 tablet 16  . cholecalciferol (VITAMIN D) 1000 UNITS tablet Take 400 Units by mouth daily.     [name] Kitchen ELIQUIS 2.5 MG TABS tablet Take 2.5 mg by mouth 2 (two) times daily.    [name] Kitchen losartan (COZAAR) 50 MG tablet Take by mouth.    . Multiple Vitamin (MULTI-VITAMIN) tablet Take by mouth.    . ondansetron (ZOFRAN-ODT) 4 MG disintegrating tablet Take 4 mg by mouth every 8 (eight) hours as needed.    . potassium chloride (KLOR-CON) 10 MEQ tablet Take 1 tablet by mouth 3 (three) times a week.    . rosuvastatin (CRESTOR) 40 MG tablet TAKE 1 TABLET BY MOUTH EVERY DAY 30 tablet 8  . torsemide (DEMADEX) 20 MG tablet Take 40 mg by mouth daily.     No facility-administered medications prior to visit.    Allergies  Allergen Reactions  . Celecoxib Hives  . Pollen Extract   . Nasacort [Triamcinolone] Other (See Comments)    Nasal - Nose Bleeds  . Vicodin [Hydrocodone-Acetaminophen] Nausea Only      Objective:    Physical Exam  Constitutional: She appears well-developed and well-nourished.  Skin: Skin is warm and dry.  Rt forearm with firmness at vein site, no signs of cellulitis.   Vitals reviewed.   BP 138/72 (BP Location: Left Arm, Patient Position: Sitting, Cuff Size: Normal)   Pulse 62   Temp 97.8 F (36.6 C) (Skin)   Ht 5\' 1"  (1.549 m)   Wt 207 lb (93.9 kg)   SpO2 97%    BMI 39.11 kg/m  Wt Readings from Last 3 Encounters:  06/26/19 207 lb (93.9 kg)  05/28/19 238 lb (108 kg)  05/24/19 230 lb (104.3 kg)    Health  Maintenance Due  Topic Date Due  . COLONOSCOPY  02/11/2019     Assessment & Plan:   Problem List Items Addressed This Visit      Cardiovascular and Mediastinum   Superficial thrombophlebitis of left upper extremity - Primary     You have a mild irritation and a superficial clot in your left arm vein where you had the IV placed. No signs of infection. This will absorb and resolve on its own. It takes a long time.   Do not massage this and do not keep touching it.   Treat with heat - like a warm washcloth -as often as you can during the day.  His vein will heal itself over time.  This chronic clot is called superficial and it will not travel to the heart, lungs, or brain and cause any type of embolus.  I do not anticipate any complications from this, but do call  back if the arm becomes swollen, red, or seems to be looking worse.   Follow-up: No follow-ups on file.   This visit occurred during the SARS-CoV-2 public health emergency.  Safety protocols were in place, including screening questions prior to the visit, additional usage of staff PPE, and extensive cleaning of exam room while observing appropriate contact time as indicated for disinfecting solutions.   Denice Paradise, NP

## 2019-06-26 NOTE — Telephone Encounter (Signed)
Sent her AVS

## 2019-06-26 NOTE — Patient Instructions (Addendum)
You have a mild irritation and a superficial clot in your left arm vein where you had the IV placed. No signs of infection. This will absorb and resolve on its own. It takes a long time.   Do not massage this and do not keep touching it.   Treat with heat - like a warm washcloth -as often as you can during the day.  His vein will heal itself over time.  This chronic clot is called superficial and it will not travel to the heart, lungs, or brain and cause any type of embolus.  I do not anticipate any complications from this, but do call  back if the arm becomes swollen, red, or seems to be looking worse.    Thrombophlebitis Thrombophlebitis is a condition in which a blood clot forms in a vein. This can happen in your arms or legs, or in the area between your neck and groin (torso). When this condition happens in a vein that is close to the surface of the body (superficial thrombophlebitis), it is usually not serious.However, when the condition happens in a vein that is deep inside the body (deep vein thrombosis, DVT), it can cause serious problems. What are the causes? This condition may be caused by:  Damage to a vein.  Inflammation of the veins.  A condition that causes blood to clot more easily.  Reduced blood flow through the veins. What increases the risk? The following factors may make you more likely to develop this condition:  Having a condition that makes blood thicker or more likely to clot.  Having an infection.  Having major surgery.  Experiencing a traumatic injury or a broken bone.  Having a catheter in a vein (central line).  Having a condition in which valves in the veins do not work properly, causing blood to collect (pool) in the veins (chronic venous insufficiency).  An inactive (sedentary) lifestyle.  Pregnancy or having recently given birth.  Cancer.  Older age, especially being 41 or older.  Obesity.  Smoking.  Taking medicines that contain  estrogen, such as birth control pills.  Having varicose veins.  Using drugs that are injected into the veins (intravenous, IV). What are the signs or symptoms? The main symptoms of this condition are:  Swelling and pain in an arm or leg. If the affected vein is in the leg, you may feel pain while standing or walking.  Warmth or redness in an arm or leg. Other symptoms include:  Low-grade fever.  Muscle aches.  A bulging vein (venous distension). In some cases, there are no symptoms. How is this diagnosed? This condition may be diagnosed based on:  Your symptoms and medical history.  A physical exam.  Tests, such as: ? Blood tests. ? A test that uses sound waves to make images (ultrasound). How is this treated? Treatment depends on how severe the condition is and which area of the body is affected. Treatment may include:  Applying a warm compress or heating pad to affected areas.  Wearing compression stockings to help prevent blood clots and reduce swelling in your legs.  Raising (elevating) the affected arm or leg above the level of your heart.  Medicines, such as: ? Anti-inflammatory medicines, such as ibuprofen. ? Blood thinners (anticoagulants), such as heparin. ? Antibiotic medicine, if you have an infection.  Removing an IV that may be causing the problem. In rare cases, surgery may be needed to:  Remove a damaged section of a vein.  Place a  filter in a large vein to catch blood clots before they reach the lungs. Follow these instructions at home: Medicines  Take over-the-counter and prescription medicines only as told by your health care provider.  If you were prescribed an antibiotic, take it as told by your health care provider. Do not stop using the antibiotic even if you feel better. Managing pain, stiffness, and swelling   If directed, put heat on the affected area as often as told by your health care provider. Use the heat source that your health  care provider recommends, such as a moist heat pack or a heating pad. ? Place a towel between your skin and the heat source. ? Leave the heat on for 20-30 minutes. ? Remove the heat if your skin turns bright red. This is especially important if you are not able to feel pain, heat, or cold. You may have a greater risk of getting burned.  Elevate the affected area above the level of your heart while you are sitting or lying down. Activity  Return to your normal activities as told by your health care provider. Ask your health care provider what activities are safe for you.  Avoid sitting or lying down for long periods. If possible, stand up and walk around regularly. If you are taking blood thinners:  Take your medicine exactly as told, at the same time every day.  Avoid activities that could cause injury or bruising, and follow instructions about how to prevent falls.  Wear a medical alert bracelet or carry a card that lists what medicines you take. General instructions  Drink enough fluid to keep your urine pale yellow.  Wear compression stockings as told by your health care provider.  Do not use any products that contain nicotine or tobacco, such as cigarettes and e-cigarettes. If you need help quitting, ask your health care provider.  Keep all follow-up visits as told by your health care provider. This is important. Contact a health care provider if:  You miss a dose of your blood thinner, if applicable.  Your symptoms do not improve.  You have unusual bruising.  You have nausea, vomiting, or diarrhea that lasts for more than one day. Get help right away if:  You have any of these problems: ? New or worse pain, swelling, or redness in an arm or leg. ? Numbness or tingling in an arm or leg. ? Shortness of breath. ? Chest pain. ? Severe pain in your abdomen. ? Fast breathing. ? A fast or irregular heartbeat. ? Blood in your vomit, stool, or urine. ? A severe headache or  confusion. ? A cut that does not stop bleeding.  You feel light-headed or dizzy.  You cough up blood.  You have a serious fall or accident, or you hit your head. These symptoms may represent a serious problem that is an emergency. Do not wait to see if the symptoms will go away. Get medical help right away. Call your local emergency services (911 in the [place] Do not drive yourself to the hospital. Summary  Thrombophlebitis is a condition in which a blood clot forms in a vein. This can happen in a vein close to the surface of the body or a vein deep inside the body.  This condition can cause serious problems when it happens in a vein deep inside the body (deep vein thrombosis, DVT).  The main symptom of this condition is swelling and pain around the affected vein.  Treatment may include warm compresses,  anti-inflammatory medicines, or blood thinners. This information is not intended to replace advice given to you by your health care provider. Make sure you discuss any questions you have with your health care provider. Document Revised: 04/25/2018 Document Reviewed: 06/29/2016 Elsevier Patient Education  [place].

## 2019-07-17 ENCOUNTER — Telehealth: Payer: Self-pay | Admitting: Family

## 2019-07-17 NOTE — Telephone Encounter (Signed)
Pt called wanting advise on the venclose procedure and the varithena procedure and want to let Joycelyn Schmid know that she is waiting for her compression socks

## 2019-07-25 NOTE — Telephone Encounter (Signed)
Jennifer [name] verbalized understanding and declined the referral. She states she will decline the procedures. She wanted to let you know that she is still waiting on a Compression Pump, not compression socks.

## 2019-07-25 NOTE — Telephone Encounter (Signed)
Call pt Unfortunately I am unfamiliar with the venclose and varithena procedure so I dont feel comfortable in advising her.  If she would like a second opinion with other vascular - [facility] ?   I can place referral

## 2019-08-08 DIAGNOSIS — Z9884 Bariatric surgery status: Secondary | ICD-10-CM | POA: Diagnosis not present

## 2019-08-08 DIAGNOSIS — E669 Obesity, unspecified: Secondary | ICD-10-CM | POA: Diagnosis not present

## 2019-08-08 DIAGNOSIS — Z713 Dietary counseling and surveillance: Secondary | ICD-10-CM | POA: Diagnosis not present

## 2019-08-29 ENCOUNTER — Other Ambulatory Visit: Payer: Self-pay

## 2019-08-29 ENCOUNTER — Encounter (INDEPENDENT_AMBULATORY_CARE_PROVIDER_SITE_OTHER): Payer: Self-pay | Admitting: Vascular Surgery

## 2019-08-29 ENCOUNTER — Ambulatory Visit (INDEPENDENT_AMBULATORY_CARE_PROVIDER_SITE_OTHER): Payer: Medicare Other | Admitting: Vascular Surgery

## 2019-08-29 VITALS — BP 149/74 | HR 60 | Ht 61.0 in | Wt 192.0 lb

## 2019-08-29 DIAGNOSIS — M1990 Unspecified osteoarthritis, unspecified site: Secondary | ICD-10-CM

## 2019-08-29 DIAGNOSIS — E7800 Pure hypercholesterolemia, unspecified: Secondary | ICD-10-CM | POA: Insufficient documentation

## 2019-08-29 DIAGNOSIS — E782 Mixed hyperlipidemia: Secondary | ICD-10-CM | POA: Diagnosis not present

## 2019-08-29 DIAGNOSIS — I10 Essential (primary) hypertension: Secondary | ICD-10-CM

## 2019-08-29 DIAGNOSIS — I872 Venous insufficiency (chronic) (peripheral): Secondary | ICD-10-CM

## 2019-08-29 DIAGNOSIS — A048 Other specified bacterial intestinal infections: Secondary | ICD-10-CM | POA: Insufficient documentation

## 2019-08-29 DIAGNOSIS — M5126 Other intervertebral disc displacement, lumbar region: Secondary | ICD-10-CM | POA: Insufficient documentation

## 2019-08-29 DIAGNOSIS — I890 Lymphedema, not elsewhere classified: Secondary | ICD-10-CM

## 2019-08-29 DIAGNOSIS — W3400XA Accidental discharge from unspecified firearms or gun, initial encounter: Secondary | ICD-10-CM | POA: Insufficient documentation

## 2019-08-29 NOTE — Progress Notes (Signed)
MRN : [id number]  Jennifer [name] is a 69 y.o. (31-Dec-1950) female who presents with chief complaint of  Chief Complaint  Patient presents with  . Follow-up    6 mo follow up  .  History of Present Illness:   The patient returns to the office for followup evaluation regarding leg swelling.  The swelling has persisted and the pain associated with swelling continues. There have not been any interval development of a ulcerations or wounds.  Since the previous visit the patient has been wearing graduated compression stockings and has noted improvement in the lymphedema. The patient has been using compression routinely morning until night. She now has a pump and has been using it and it seems to be helping.  She continues to have "pain in the legs that makes it hard to walk"  The patient also states elevation during the day and exercise is being done too.    No outpatient medications have been marked as taking for the 08/29/19 encounter (Office Visit) with Delana Meyer, Dolores Lory, MD.    Past Medical History:  Diagnosis Date  . Anxiety   . Constipation   . Elevated liver enzymes   . Hemorrhoids   . Herpes genitalis   . High cholesterol   . Hyperlipidemia   . Hypertension   . Hypothyroidism   . Osteoarthritis   . Sleep apnea     Past Surgical History:  Procedure Laterality Date  . ABDOMINAL HYSTERECTOMY     total for fibroids no h/o abnormal pap  . bariatric sleeve  2015  . BREAST EXCISIONAL BIOPSY Left 1998  . carpal tunnel repair    . COLONOSCOPY WITH PROPOFOL N/A 02/11/2016   Procedure: COLONOSCOPY WITH PROPOFOL;  Surgeon: Jonathon Bellows, MD;  Location: [facility] ENDOSCOPY;  Service: Endoscopy;  Laterality: N/A;  . HEMORRHOID SURGERY      Social History Social History   Tobacco Use  . Smoking status: Former Smoker    Packs/day: 1.50    Years: 24.00    Pack years: 36.00    Types: Cigarettes    Quit date: 1993    Years since quitting: 28.6  . Smokeless tobacco: Never  Used  . Tobacco comment: quit 1995.   Vaping Use  . Vaping Use: Never used  Substance Use Topics  . Alcohol use: Not Currently  . Drug use: No    Family History Family History  Problem Relation Age of Onset  . Breast cancer Sister 67       materal 1/2 sister  . Hypertension Sister   . Hypertension Mother   . Heart disease Father   . Hypertension Brother     Allergies  Allergen Reactions  . Celecoxib Hives  . Pollen Extract   . Nasacort [Triamcinolone] Other (See Comments)    Nasal - Nose Bleeds  . Vicodin [Hydrocodone-Acetaminophen] Nausea Only     REVIEW OF SYSTEMS (Negative unless checked)  Constitutional: [] Weight loss  [] Fever  [] Chills Cardiac: [] Chest pain   [] Chest pressure   [] Palpitations   [] Shortness of breath when laying flat   [] Shortness of breath with exertion. Vascular:  [] Pain in legs with walking   [x] Pain in legs at rest  [] History of DVT   [] Phlebitis   [x] Swelling in legs   [] Varicose veins   [] Non-healing ulcers Pulmonary:   [] Uses home oxygen   [] Productive cough   [] Hemoptysis   [] Wheeze  [] COPD   [] Asthma Neurologic:  [] Dizziness   [] Seizures   [] History of stroke   []   History of TIA  [] Aphasia   [] Vissual changes   [] Weakness or numbness in arm   [] Weakness or numbness in leg Musculoskeletal:   [] Joint swelling   [x] Joint pain   [] Low back pain Hematologic:  [] Easy bruising  [] Easy bleeding   [] Hypercoagulable state   [] Anemic Gastrointestinal:  [] Diarrhea   [] Vomiting  [] Gastroesophageal reflux/heartburn   [] Difficulty swallowing. Genitourinary:  [] Chronic kidney disease   [] Difficult urination  [] Frequent urination   [] Blood in urine Skin:  [] Rashes   [] Ulcers  Psychological:  [] History of anxiety   []  History of major depression.  Physical Examination  Vitals:   08/29/19 1026  BP: (!) 149/74  Pulse: 60  Weight: 192 lb (87.1 kg)  Height: 5\' 1"  (1.549 m)   Body mass index is 36.28 kg/m. Gen: WD/WN, NAD Head: [place]/AT, No temporalis  wasting.  Ear/Nose/Throat: Hearing grossly intact, nares w/o erythema or drainage Eyes: PER, EOMI, sclera nonicteric.  Neck: Supple, no large masses.   Pulmonary:  Good air movement, no audible wheezing bilaterally, no use of accessory muscles.  Cardiac: RRR, no JVD Vascular: scattered varicosities present bilaterally.  Mild venous stasis changes to the legs bilaterally.  2+ soft pitting edema Vessel Right Left  Radial Palpable Palpable  Gastrointestinal: Non-distended. No guarding/no peritoneal signs.  Musculoskeletal: M/S 5/5 throughout.  No deformity or atrophy.  Neurologic: CN 2-12 intact. Symmetrical.  Speech is fluent. Motor exam as listed above. Psychiatric: Judgment intact, Mood & affect appropriate for pt's clinical situation. Dermatologic: No rashes or ulcers noted.  No changes consistent with cellulitis. Lymph : No lichenification or skin changes of chronic lymphedema.  CBC Lab Results  Component Value Date   WBC 7.6 05/24/2019   HGB 13.2 05/24/2019   HCT 41.5 05/24/2019   MCV 81.9 05/24/2019   PLT 251 05/24/2019    BMET    Component Value Date/Time   NA 138 05/24/2019 1420   NA 139 06/26/2013 0409   K 3.8 05/24/2019 1420   K 4.3 06/26/2013 0409   CL 100 05/24/2019 1420   CL 106 06/26/2013 0409   CO2 28 05/24/2019 1420   CO2 28 06/26/2013 0409   GLUCOSE 120 (H) 05/24/2019 1420   GLUCOSE 108 (H) 06/26/2013 0409   BUN 12 05/24/2019 1420   BUN 12 06/26/2013 0409   CREATININE 0.71 05/24/2019 1420   CREATININE 0.72 06/26/2013 0409   CALCIUM 8.8 (L) 05/24/2019 1420   CALCIUM 8.5 06/26/2013 0409   GFRNONAA >60 05/24/2019 1420   GFRNONAA >60 06/26/2013 0409   GFRAA >60 05/24/2019 1420   GFRAA >60 06/26/2013 0409   CrCl cannot be calculated (Patient's most recent lab result is older than the maximum 21 days allowed.).  COAG No results found for: INR, PROTIME  Radiology No results found.   Assessment/Plan 1. Chronic venous insufficiency  No surgery or  intervention at this point in time.    I have informed her that edema nad venous insufficiency do not cause severe pain that prevents a person from walking.  I have asked her to follow up with her primary for further workup perhaps of the LS spine.  I have reviewed my discussion with the patient regarding lymphedema and why it  causes symptoms.  Patient will continue wearing graduated compression stockings class 1 (20-30 mmHg) on a daily basis a prescription was given. The patient is reminded to put the stockings on first thing in the morning and removing them in the evening. The patient is instructed specifically not to  sleep in the stockings.   In addition, behavioral modification throughout the day will be continued.  This will include frequent elevation (such as in a recliner), use of over the counter pain medications as needed and exercise such as walking.  I have reviewed systemic causes for chronic edema such as liver, kidney and cardiac etiologies and there does not appear to be any significant changes in these organ systems over the past year.  The patient is under the impression that these organ systems are all stable and unchanged.    The patient will continue aggressive use of the  lymph pump.  This will continue to improve the edema control and prevent sequela such as ulcers and infections.   The patient will follow-up with me on an annual basis.    2. Lymphedema  No surgery or intervention at this point in time.    I have informed her that edema nad venous insufficiency do not cause severe pain that prevents a person from walking.  I have asked her to follow up with her primary for further workup perhaps of the LS spine.  I have reviewed my discussion with the patient regarding lymphedema and why it  causes symptoms.  Patient will continue wearing graduated compression stockings class 1 (20-30 mmHg) on a daily basis a prescription was given. The patient is reminded to put the  stockings on first thing in the morning and removing them in the evening. The patient is instructed specifically not to sleep in the stockings.   In addition, behavioral modification throughout the day will be continued.  This will include frequent elevation (such as in a recliner), use of over the counter pain medications as needed and exercise such as walking.  I have reviewed systemic causes for chronic edema such as liver, kidney and cardiac etiologies and there does not appear to be any significant changes in these organ systems over the past year.  The patient is under the impression that these organ systems are all stable and unchanged.    The patient will continue aggressive use of the  lymph pump.  This will continue to improve the edema control and prevent sequela such as ulcers and infections.   The patient will follow-up with me on an annual basis.    3. Essential hypertension Continue antihypertensive medications as already ordered, these medications have been reviewed and there are no changes at this time.   4. Arthritis Continue NSAID medications as already ordered, these medications have been reviewed and there are no changes at this time.  Continued activity and therapy was stressed.   5. Mixed hyperlipidemia Continue statin as ordered and reviewed, no changes at this time    Hortencia Pilar, MD  08/29/2019 10:33 AM

## 2019-09-03 DIAGNOSIS — R600 Localized edema: Secondary | ICD-10-CM | POA: Diagnosis not present

## 2019-09-03 DIAGNOSIS — I872 Venous insufficiency (chronic) (peripheral): Secondary | ICD-10-CM | POA: Diagnosis not present

## 2019-09-03 DIAGNOSIS — R0602 Shortness of breath: Secondary | ICD-10-CM | POA: Diagnosis not present

## 2019-09-03 DIAGNOSIS — R011 Cardiac murmur, unspecified: Secondary | ICD-10-CM | POA: Diagnosis not present

## 2019-09-03 DIAGNOSIS — I890 Lymphedema, not elsewhere classified: Secondary | ICD-10-CM | POA: Diagnosis not present

## 2019-09-03 DIAGNOSIS — R0600 Dyspnea, unspecified: Secondary | ICD-10-CM | POA: Diagnosis not present

## 2019-09-03 DIAGNOSIS — G4733 Obstructive sleep apnea (adult) (pediatric): Secondary | ICD-10-CM | POA: Diagnosis not present

## 2019-09-03 DIAGNOSIS — I10 Essential (primary) hypertension: Secondary | ICD-10-CM | POA: Diagnosis not present

## 2019-09-03 DIAGNOSIS — I208 Other forms of angina pectoris: Secondary | ICD-10-CM | POA: Diagnosis not present

## 2019-09-03 DIAGNOSIS — R001 Bradycardia, unspecified: Secondary | ICD-10-CM | POA: Diagnosis not present

## 2019-09-03 DIAGNOSIS — K219 Gastro-esophageal reflux disease without esophagitis: Secondary | ICD-10-CM | POA: Diagnosis not present

## 2019-09-06 ENCOUNTER — Ambulatory Visit (INDEPENDENT_AMBULATORY_CARE_PROVIDER_SITE_OTHER): Payer: Medicare Other

## 2019-09-06 VITALS — BP 140/73 | Ht 61.0 in | Wt 188.0 lb

## 2019-09-06 DIAGNOSIS — Z1211 Encounter for screening for malignant neoplasm of colon: Secondary | ICD-10-CM | POA: Diagnosis not present

## 2019-09-06 DIAGNOSIS — Z0000 Encounter for general adult medical examination without abnormal findings: Secondary | ICD-10-CM | POA: Diagnosis not present

## 2019-09-06 NOTE — Patient Instructions (Addendum)
Jennifer [name] , Thank you for taking time to come for your Medicare Wellness Visit. I appreciate your ongoing commitment to your health goals. Please review the following plan we discussed and let me know if I can assist you in the future.   These are the goals we discussed: Goals      Patient Stated   .  I would like to lose weight (pt-stated)      I would like my weight to 140lb-160lb Stay active Healthy diet       This is a list of the screening recommended for you and due dates:  Health Maintenance  Topic Date Due  . Urine Protein Check  Never done  . Colon Cancer Screening  02/11/2019  . Flu Shot  12/07/2019*  . Mammogram  02/27/2021  . Tetanus Vaccine  08/06/2028  . DEXA scan (bone density measurement)  Completed  . COVID-19 Vaccine  Completed  .  Hepatitis C: One time screening is recommended by Center for Disease Control  (CDC) for  adults born from [birth year] through 1966.   Completed  . Pneumonia vaccines  Completed  *Topic was postponed. The date shown is not the original due date.    Immunizations Immunization History  Administered Date(s) Administered  . Hep A / Hep B 05/24/2013, 11/21/2013  . Influenza Split 10/21/2013  . Influenza, High Dose Seasonal PF 10/24/2016, 09/06/2018  . Influenza,inj,Quad PF,6+ Mos 11/08/2013, 10/28/2014  . Influenza-Unspecified 12/05/2011, 10/28/2015, 10/24/2016, 09/06/2017  . PFIZER SARS-COV-2 Vaccination 03/15/2019, 04/09/2019, 04/09/2019  . Pneumococcal Conjugate-13 12/05/2014, 04/06/2016  . Pneumococcal Polysaccharide-23 06/05/2017  . Tdap 02/05/2014, 08/07/2018  . Zoster 01/09/2012, 12/05/2014  . Zoster Recombinat (Shingrix) 12/25/2018   Keep all routine maintenance appointments.   Follow up 09/12/19 @ 10:30  Advanced directives: End of life planning; Advance aging; Advanced directives discussed.  Copy of current HCPOA/Living Will requested.    Follow up in one year for your annual wellness visit.  Colonoscopy- ordered.  The office will contact you for scheduling.    Preventive Care 36 Years and Older, Female Preventive care refers to lifestyle choices and visits with your health care provider that can promote health and wellness. What does preventive care include?  A yearly physical exam. This is also called an annual well check.  Dental exams once or twice a year.  Routine eye exams. Ask your health care provider how often you should have your eyes checked.  Personal lifestyle choices, including:  Daily care of your teeth and gums.  Regular physical activity.  Eating a healthy diet.  Avoiding tobacco and drug use.  Limiting alcohol use.  Practicing safe sex.  Taking low-dose aspirin every day.  Taking vitamin and mineral supplements as recommended by your health care provider. What happens during an annual well check? The services and screenings done by your health care provider during your annual well check will depend on your age, overall health, lifestyle risk factors, and family history of disease. Counseling  Your health care provider may ask you questions about your:  Alcohol use.  Tobacco use.  Drug use.  Emotional well-being.  Home and relationship well-being.  Sexual activity.  Eating habits.  History of falls.  Memory and ability to understand (cognition).  Work and work Statistician.  Reproductive health. Screening  You may have the following tests or measurements:  Height, weight, and BMI.  Blood pressure.  Lipid and cholesterol levels. These may be checked every 5 years, or more frequently if you are over  13 years old.  Skin check.  Lung cancer screening. You may have this screening every year starting at age 71 if you have a 30-pack-year history of smoking and currently smoke or have quit within the past 15 years.  Fecal occult blood test (FOBT) of the stool. You may have this test every year starting at age 89.  Flexible sigmoidoscopy or  colonoscopy. You may have a sigmoidoscopy every 5 years or a colonoscopy every 10 years starting at age 64.  Hepatitis C blood test.  Hepatitis B blood test.  Sexually transmitted disease (STD) testing.  Diabetes screening. This is done by checking your blood sugar (glucose) after you have not eaten for a while (fasting). You may have this done every 1-3 years.  Bone density scan. This is done to screen for osteoporosis. You may have this done starting at age 30.  Mammogram. This may be done every 1-2 years. Talk to your health care provider about how often you should have regular mammograms. Talk with your health care provider about your test results, treatment options, and if necessary, the need for more tests. Vaccines  Your health care provider may recommend certain vaccines, such as:  Influenza vaccine. This is recommended every year.  Tetanus, diphtheria, and acellular pertussis (Tdap, Td) vaccine. You may need a Td booster every 10 years.  Zoster vaccine. You may need this after age 58.  Pneumococcal 13-valent conjugate (PCV13) vaccine. One dose is recommended after age 33.  Pneumococcal polysaccharide (PPSV23) vaccine. One dose is recommended after age 32. Talk to your health care provider about which screenings and vaccines you need and how often you need them. This information is not intended to replace advice given to you by your health care provider. Make sure you discuss any questions you have with your health care provider. Document Released: 01/30/2015 Document Revised: 09/23/2015 Document Reviewed: 11/04/2014 Elsevier Interactive Patient Education  2017 [place] Prevention in the Home Falls can cause injuries. They can happen to people of all ages. There are many things you can do to make your home safe and to help prevent falls. What can I do on the outside of my home?  Regularly fix the edges of walkways and driveways and fix any cracks.  Remove  anything that might make you trip as you walk through a door, such as a raised step or threshold.  Trim any bushes or trees on the path to your home.  Use bright outdoor lighting.  Clear any walking paths of anything that might make someone trip, such as rocks or tools.  Regularly check to see if handrails are loose or broken. Make sure that both sides of any steps have handrails.  Any raised decks and porches should have guardrails on the edges.  Have any leaves, snow, or ice cleared regularly.  Use sand or salt on walking paths during winter.  Clean up any spills in your garage right away. This includes oil or grease spills. What can I do in the bathroom?  Use night lights.  Install grab bars by the toilet and in the tub and shower. Do not use towel bars as grab bars.  Use non-skid mats or decals in the tub or shower.  If you need to sit down in the shower, use a plastic, non-slip stool.  Keep the floor dry. Clean up any water that spills on the floor as soon as it happens.  Remove soap buildup in the tub or shower regularly.  Attach bath mats securely with double-sided non-slip rug tape.  Do not have throw rugs and other things on the floor that can make you trip. What can I do in the bedroom?  Use night lights.  Make sure that you have a light by your bed that is easy to reach.  Do not use any sheets or blankets that are too big for your bed. They should not hang down onto the floor.  Have a firm chair that has side arms. You can use this for support while you get dressed.  Do not have throw rugs and other things on the floor that can make you trip. What can I do in the kitchen?  Clean up any spills right away.  Avoid walking on wet floors.  Keep items that you use a lot in easy-to-reach places.  If you need to reach something above you, use a strong step stool that has a grab bar.  Keep electrical cords out of the way.  Do not use floor polish or wax that  makes floors slippery. If you must use wax, use non-skid floor wax.  Do not have throw rugs and other things on the floor that can make you trip. What can I do with my stairs?  Do not leave any items on the stairs.  Make sure that there are handrails on both sides of the stairs and use them. Fix handrails that are broken or loose. Make sure that handrails are as long as the stairways.  Check any carpeting to make sure that it is firmly attached to the stairs. Fix any carpet that is loose or worn.  Avoid having throw rugs at the top or bottom of the stairs. If you do have throw rugs, attach them to the floor with carpet tape.  Make sure that you have a light switch at the top of the stairs and the bottom of the stairs. If you do not have them, ask someone to add them for you. What else can I do to help prevent falls?  Wear shoes that:  Do not have high heels.  Have rubber bottoms.  Are comfortable and fit you well.  Are closed at the toe. Do not wear sandals.  If you use a stepladder:  Make sure that it is fully opened. Do not climb a closed stepladder.  Make sure that both sides of the stepladder are locked into place.  Ask someone to hold it for you, if possible.  Clearly mark and make sure that you can see:  Any grab bars or handrails.  First and last steps.  Where the edge of each step is.  Use tools that help you move around (mobility aids) if they are needed. These include:  Canes.  Walkers.  Scooters.  Crutches.  Turn on the lights when you go into a dark area. Replace any light bulbs as soon as they burn out.  Set up your furniture so you have a clear path. Avoid moving your furniture around.  If any of your floors are uneven, fix them.  If there are any pets around you, be aware of where they are.  Review your medicines with your doctor. Some medicines can make you feel dizzy. This can increase your chance of falling. Ask your doctor what other  things that you can do to help prevent falls. This information is not intended to replace advice given to you by your health care provider. Make sure you discuss any questions you have  with your health care provider. Document Released: 10/30/2008 Document Revised: 06/11/2015 Document Reviewed: 02/07/2014 Elsevier Interactive Patient Education  2017 [place].  Colonoscopy, Adult A colonoscopy is a procedure to look at the entire large intestine. This procedure is done using a long, thin, flexible tube that has a camera on the end. You may have a colonoscopy:  As a part of normal colorectal screening.  If you have certain symptoms, such as: ? A low number of red blood cells in your blood (anemia). ? Diarrhea that does not go away. ? Pain in your abdomen. ? Blood in your stool. A colonoscopy can help screen for and diagnose medical problems, including:  Tumors.  Extra tissue that grows where mucus forms (polyps).  Inflammation.  Areas of bleeding. Tell your health care provider about:  Any allergies you have.  All medicines you are taking, including vitamins, herbs, eye drops, creams, and over-the-counter medicines.  Any problems you or family members have had with anesthetic medicines.  Any blood disorders you have.  Any surgeries you have had.  Any medical conditions you have.  Any problems you have had with having bowel movements.  Whether you are pregnant or may be pregnant. What are the risks? Generally, this is a safe procedure. However, problems may occur, including:  Bleeding.  Damage to your intestine.  Allergic reactions to medicines given during the procedure.  Infection. This is rare. What happens before the procedure? Eating and drinking restrictions Follow instructions from your health care provider about eating or drinking restrictions, which may include:  A few days before the procedure: ? Follow a low-fiber diet. ? Avoid nuts, seeds, dried  fruit, raw fruits, and vegetables.  1-3 days before the procedure: ? Eat only gelatin dessert or ice pops. ? Drink only clear liquids, such as water, clear juice, clear broth or bouillon, black coffee or tea, or clear soft drinks or sports drinks. ? Avoid liquids that contain red or purple dye.  The day of the procedure: ? Do not eat solid foods. You may continue to drink clear liquids until up to 2 hours before the procedure. ? Do not eat or drink anything starting 2 hours before the procedure, or within the time period that your health care provider recommends. Bowel prep If you were prescribed a bowel prep to take by mouth (orally) to clean out your colon:  Take it as told by your health care provider. Starting the day before your procedure, you will need to drink a large amount of liquid medicine. The liquid will cause you to have many bowel movements of loose stool until your stool becomes almost clear or light green.  If your skin or the opening between the buttocks (anus) gets irritated from diarrhea, you may relieve the irritation using: ? Wipes with medicine in them, such as adult wet wipes with aloe and vitamin E. ? A product to soothe skin, such as petroleum jelly.  If you vomit while drinking the bowel prep: ? Take a break for up to 60 minutes. ? Begin the bowel prep again. ? Call your health care provider if you keep vomiting or you cannot take the bowel prep without vomiting.  To clean out your colon, you may also be given: ? Laxative medicines. These help you have a bowel movement. ? Instructions for enema use. An enema is liquid medicine injected into your rectum. Medicines Ask your health care provider about:  Changing or stopping your regular medicines or supplements. This is  especially important if you are taking iron supplements, diabetes medicines, or blood thinners.  Taking medicines such as aspirin and ibuprofen. These medicines can thin your blood. Do not take  these medicines unless your health care provider tells you to take them.  Taking over-the-counter medicines, vitamins, herbs, and supplements. General instructions  Ask your health care provider what steps will be taken to help prevent infection. These may include washing skin with a germ-killing soap.  Plan to have someone take you home from the hospital or clinic. What happens during the procedure?   An IV will be inserted into one of your veins.  You may be given one or more of the following: ? A medicine to help you relax (sedative). ? A medicine to numb the area (local anesthetic). ? A medicine to make you fall asleep (general anesthetic). This is rarely needed.  You will lie on your side with your knees bent.  The tube will: ? Have oil or gel put on it (be lubricated). ? Be inserted into your anus. ? Be gently eased through all parts of your large intestine.  Air will be sent into your colon to keep it open. This may cause some pressure or cramping.  Images will be taken with the camera and will appear on a screen.  A small tissue sample may be removed to be looked at under a microscope (biopsy). The tissue may be sent to a lab for testing if any signs of problems are found.  If small polyps are found, they may be removed and checked for cancer cells.  When the procedure is finished, the tube will be removed. The procedure may vary among health care providers and hospitals. What happens after the procedure?  Your blood pressure, heart rate, breathing rate, and blood oxygen level will be monitored until you leave the hospital or clinic.  You may have a small amount of blood in your stool.  You may pass gas and have mild cramping or bloating in your abdomen. This is caused by the air that was used to open your colon during the exam.  Do not drive for 24 hours after the procedure.  It is up to you to get the results of your procedure. Ask your health care provider, or  the department that is doing the procedure, when your results will be ready. Summary  A colonoscopy is a procedure to look at the entire large intestine.  Follow instructions from your health care provider about eating and drinking before the procedure.  If you were prescribed an oral bowel prep to clean out your colon, take it as told by your health care provider.  During the colonoscopy, a flexible tube with a camera on its end is inserted into the anus and then passed into the other parts of the large intestine. This information is not intended to replace advice given to you by your health care provider. Make sure you discuss any questions you have with your health care provider. Document Revised: 07/27/2018 Document Reviewed: 07/27/2018 Elsevier Patient Education  [place].

## 2019-09-06 NOTE — Progress Notes (Addendum)
Subjective:   Jennifer [name] is a 69 y.o. female who presents for Medicare Annual (Subsequent) preventive examination.  Review of Systems    No ROS.  Medicare Wellness Virtual Visit.     Cardiac Risk Factors include: advanced age (>79men, >80 women);diabetes mellitus;hypertension     Objective:    Today's Vitals   09/06/19 0902  BP: 140/73  Weight: 188 lb (85.3 kg)  Height: 5\' 1"  (1.549 m)   Body mass index is 35.52 kg/m.  Advanced Directives 09/06/2019 05/24/2019 09/05/2018 05/22/2018 03/07/2018 08/31/2017 09/03/2014  Does Patient Have a Medical Advance Directive? Yes No Yes Yes Yes Yes No  Type of Paramedic of [place];Living will - Living will [place];Living will Living will Living will;Healthcare Power of Attorney -  Does patient want to make changes to medical advance directive? No - Patient declined - No - Patient declined - - No - Patient declined -  Copy of [place] in Chart? No - copy requested - - - - No - copy requested -  Would patient like information on creating a medical advance directive? - - - - - - Yes - Educational materials given    Current Medications (verified) Outpatient Encounter Medications as of 09/06/2019  Medication Sig  . acetaminophen (TYLENOL) 500 MG tablet Take by mouth.  [name] Kitchen acyclovir (ZOVIRAX) 400 MG tablet TAKE 1 TABLET BY MOUTH EVERY DAY  . Ascorbic Acid (VITAMIN C) 1000 MG tablet Take 1,000 mg by mouth daily.  [name] Kitchen aspirin 81 MG tablet Take 81 mg by mouth daily.  [name] Kitchen azelastine (ASTELIN) 0.1 % nasal spray USE 1 SPRAY EACH NOSTRIL TWICE A DAY AS NEEDED FOR ALLERGIES  . fexofenadine (ALLEGRA) 180 MG tablet Take 1 tablet (180 mg total) by mouth daily.  . fluticasone (FLONASE) 50 MCG/ACT nasal spray PLACE 1 SPRAY INTO BOTH NOSTRILS DAILY.  [name] Kitchen gabapentin (NEURONTIN) 100 MG capsule Take 2 capsules (200 mg total) by mouth 3 (three) times daily.  . hydrocortisone (ANUSOL-HC) 25 MG suppository  Place 1 suppository (25 mg total) rectally 2 (two) times daily as needed for hemorrhoids or anal itching.  . levothyroxine (SYNTHROID) 50 MCG tablet TAKE 1 TABLET BY MOUTH EVERY DAY  . omeprazole (PRILOSEC) 20 MG capsule TAKE 1 CAPSULE BY MOUTH EVERY DAY  . traZODone (DESYREL) 100 MG tablet TAKE 1 TABLET BY MOUTH EVERYDAY AT BEDTIME   No facility-administered encounter medications on file as of 09/06/2019.    Allergies (verified) Celecoxib, Pollen extract, Nasacort [triamcinolone], and Vicodin [hydrocodone-acetaminophen]   History: Past Medical History:  Diagnosis Date  . Anxiety   . Bariatric surgery status   . Constipation   . Elevated liver enzymes   . Hemorrhoids   . Herpes genitalis   . High cholesterol   . Hyperlipidemia   . Hypertension   . Hypothyroidism   . Osteoarthritis   . Sleep apnea    Past Surgical History:  Procedure Laterality Date  . ABDOMINAL HYSTERECTOMY     total for fibroids no h/o abnormal pap  . bariatric sleeve  2015  . BREAST EXCISIONAL BIOPSY Left 1998  . carpal tunnel repair    . COLONOSCOPY WITH PROPOFOL N/A 02/11/2016   Procedure: COLONOSCOPY WITH PROPOFOL;  Surgeon: Jonathon Bellows, MD;  Location: [facility] ENDOSCOPY;  Service: Endoscopy;  Laterality: N/A;  . HEMORRHOID SURGERY     Family History  Problem Relation Age of Onset  . Breast cancer Sister 41       materal  1/2 sister  . Hypertension Sister   . Hypertension Mother   . Heart disease Father   . Hypertension Brother    Social History   Socioeconomic History  . Marital status: Married    Spouse name: Not on file  . Number of children: Not on file  . Years of education: Not on file  . Highest education level: Not on file  Occupational History  . Not on file  Tobacco Use  . Smoking status: Former Smoker    Packs/day: 1.50    Years: 24.00    Pack years: 36.00    Types: Cigarettes    Quit date: 1993    Years since quitting: 28.6  . Smokeless tobacco: Never Used  . Tobacco comment:  quit 1995.   Vaping Use  . Vaping Use: Never used  Substance and Sexual Activity  . Alcohol use: Not Currently  . Drug use: No  . Sexual activity: Not Currently    Birth control/protection: Surgical    Comment: Hysterectomy  Other Topics Concern  . Not on file  Social History Narrative   Lives in [place].    Married.    Retired 2015, Interior and spatial designer.    One son; granddaughter.    Social Determinants of Health   Financial Resource Strain: Low Risk   . Difficulty of Paying Living Expenses: Not hard at all  Food Insecurity: No Food Insecurity  . Worried About Charity fundraiser in the Last Year: Never true  . Ran Out of Food in the Last Year: Never true  Transportation Needs: No Transportation Needs  . Lack of Transportation (Medical): No  . Lack of Transportation (Non-Medical): No  Physical Activity: Insufficiently Active  . Days of Exercise per Week: 3 days  . Minutes of Exercise per Session: 20 min  Stress: No Stress Concern Present  . Feeling of Stress : Not at all  Social Connections: Unknown  . Frequency of Communication with Friends and Family: More than three times a week  . Frequency of Social Gatherings with Friends and Family: More than three times a week  . Attends Religious Services: Not on file  . Active Member of Clubs or Organizations: Not on file  . Attends Archivist Meetings: Not on file  . Marital Status: Not on file    Tobacco Counseling Counseling given: Not Answered Comment: quit 1995.    Clinical Intake:  Pre-visit preparation completed: Yes        Diabetes: No  How often do you need to have someone help you when you read instructions, pamphlets, or other written materials from your doctor or pharmacy?: 1 - Never Interpreter Needed?: No      Activities of Daily Living In your present state of health, do you have any difficulty performing the following activities: 09/06/2019  Hearing? N  Vision? N  Difficulty  concentrating or making decisions? N  Walking or climbing stairs? Y  Comment Unsteady gait  Dressing or bathing? N  Doing errands, shopping? N  Preparing Food and eating ? N  Using the Toilet? N  In the past six months, have you accidently leaked urine? N  Do you have problems with loss of bowel control? N  Managing your Medications? N  Managing your Finances? N  Housekeeping or managing your Housekeeping? N  Some recent data might be hidden    Patient Care Team: [name], FNP as PCP - General (Family Medicine) Kennith Center, RD as  Dietitian (Family Medicine)  Indicate any recent Medical Services you may have received from other than [facility] providers in the past year (date may be approximate).     Assessment:   This is a routine wellness examination for Delita.  I connected with Terrika today by telephone and verified that I am speaking with the correct person using two identifiers. Location patient: home Location provider: work Persons participating in the virtual visit: patient, Marine scientist.    I discussed the limitations, risks, security and privacy concerns of performing an evaluation and management service by telephone and the availability of in person appointments. The patient expressed understanding and verbally consented to this telephonic visit.    Interactive audio and video telecommunications were attempted between this provider and patient, however failed, due to patient having technical difficulties OR patient did not have access to video capability.  We continued and completed visit with audio only.  Some vital signs may be absent or patient reported.   Hearing/Vision screen  Hearing Screening   125Hz  250Hz  500Hz  1000Hz  2000Hz  3000Hz  4000Hz  6000Hz  8000Hz   Right ear:           Left ear:           Comments: Patient is followed by [facility]. She does not wear hearing aids. Some difficulty hearing some conversational tones  Vision Screening Comments: Followed  by [facility] Wears corrective lenses Visual acuity not assessed, virtual visit.  They have seen their ophthalmologist in the last 12 months.     Dietary issues and exercise activities discussed: Current Exercise Habits: Home exercise routine, Type of exercise: calisthenics, Time (Minutes): 20, Frequency (Times/Week): 3, Weekly Exercise (Minutes/Week): 60 Low/No carb Low salt High protein Good water intake   Goals      Patient Stated   .  I would like to lose weight (pt-stated)      I would like my weight to 140lb-160lb Stay active Healthy diet      Depression Screen PHQ 2/9 Scores 09/06/2019 09/05/2018 06/22/2018 05/22/2018 01/24/2018 08/31/2017 03/07/2017  PHQ - 2 Score 0 0 0 0 1 0 0  PHQ- 9 Score - - 0 - 6 - -    Fall Risk Fall Risk  09/06/2019 06/26/2019 04/09/2019 09/05/2018 05/22/2018  Falls in the past year? 0 0 0 0 0  Number falls in past yr: 0 - - 0 -  Injury with Fall? - - - 0 -  Follow up Falls evaluation completed Falls evaluation completed Falls evaluation completed Falls evaluation completed -   Handrails in use when climbing stairs?Yes Home free of loose throw rugs in walkways, pet beds, electrical cords, etc? Yes  Adequate lighting in your home to reduce risk of falls? Yes   TIMED UP AND GO:  Was the test performed? No . Virtual visit.   Cognitive Function: MMSE - Mini Mental State Exam 08/31/2017  Orientation to time 5  Orientation to Place 5  Registration 3  Attention/ Calculation 5  Recall 3  Language- name 2 objects 2  Language- repeat 1  Language- follow 3 step command 3  Language- read & follow direction 1  Write a sentence 1  Copy design 1  Total score 30     6CIT Screen 09/06/2019 09/05/2018  What Year? 0 points 0 points  What month? 0 points 0 points  What time? 0 points 0 points  Count back from 20 0 points 0 points  Months in reverse 0 points 0 points  Repeat phrase  0 points 0 points  Total Score 0 0    Immunizations Immunization  History  Administered Date(s) Administered  . Hep A / Hep B 05/24/2013, 11/21/2013  . Influenza Split 10/21/2013  . Influenza, High Dose Seasonal PF 10/24/2016, 09/06/2018  . Influenza,inj,Quad PF,6+ Mos 11/08/2013, 10/28/2014  . Influenza-Unspecified 12/05/2011, 10/28/2015, 10/24/2016, 09/06/2017  . PFIZER SARS-COV-2 Vaccination 03/15/2019, 04/09/2019, 04/09/2019  . Pneumococcal Conjugate-13 12/05/2014, 04/06/2016  . Pneumococcal Polysaccharide-23 06/05/2017  . Tdap 02/05/2014, 08/07/2018  . Zoster 01/09/2012, 12/05/2014  . Zoster Recombinat (Shingrix) 12/25/2018    Health Maintenance Health Maintenance  Topic Date Due  . URINE MICROALBUMIN  Never done  . COLONOSCOPY  02/11/2019  . INFLUENZA VACCINE  12/07/2019 (Originally 08/18/2019)  . MAMMOGRAM  02/27/2021  . TETANUS/TDAP  08/06/2028  . DEXA SCAN  Completed  . COVID-19 Vaccine  Completed  . Hepatitis C Screening  Completed  . PNA vac Low Risk Adult  Completed   Colonoscopy- consent given to order.  Ordered.   Dental Screening: Recommended annual dental exams for proper oral hygiene. Visits every 6 months.   Community Resource Referral / Chronic Care Management: CRR required this visit?  No   CCM required this visit?  No      Plan:   Keep all routine maintenance appointments.   Follow up 09/12/19 @ 10:30  I have personally reviewed and noted the following in the patient's chart:   . Medical and social history . Use of alcohol, tobacco or illicit drugs  . Current medications and supplements . Functional ability and status . Nutritional status . Physical activity . Advanced directives . List of other physicians . Hospitalizations, surgeries, and ER visits in previous 12 months . Vitals . Screenings to include cognitive, depression, and falls . Referrals and appointments  In addition, I have reviewed and discussed with patient certain preventive protocols, quality metrics, and best practice recommendations. A  written personalized care plan for preventive services as well as general preventive health recommendations were provided to patient via mychart.     Varney Biles, LPN   8/89/1694    Agree with plan. [name], NP

## 2019-09-09 ENCOUNTER — Other Ambulatory Visit: Payer: Self-pay | Admitting: Family

## 2019-09-09 DIAGNOSIS — J3489 Other specified disorders of nose and nasal sinuses: Secondary | ICD-10-CM

## 2019-09-11 ENCOUNTER — Encounter: Payer: Self-pay | Admitting: Nurse Practitioner

## 2019-09-12 ENCOUNTER — Ambulatory Visit: Payer: Medicare Other | Admitting: Family

## 2019-09-12 DIAGNOSIS — Z03818 Encounter for observation for suspected exposure to other biological agents ruled out: Secondary | ICD-10-CM | POA: Diagnosis not present

## 2019-09-12 DIAGNOSIS — Z20828 Contact with and (suspected) exposure to other viral communicable diseases: Secondary | ICD-10-CM | POA: Diagnosis not present

## 2019-09-12 DIAGNOSIS — Z1152 Encounter for screening for COVID-19: Secondary | ICD-10-CM | POA: Diagnosis not present

## 2019-09-19 ENCOUNTER — Other Ambulatory Visit: Payer: Self-pay

## 2019-09-19 ENCOUNTER — Other Ambulatory Visit: Payer: Self-pay | Admitting: Family

## 2019-09-19 ENCOUNTER — Telehealth (INDEPENDENT_AMBULATORY_CARE_PROVIDER_SITE_OTHER): Payer: Self-pay | Admitting: Gastroenterology

## 2019-09-19 DIAGNOSIS — Z86010 Personal history of colonic polyps: Secondary | ICD-10-CM

## 2019-09-19 DIAGNOSIS — M79671 Pain in right foot: Secondary | ICD-10-CM

## 2019-09-19 MED ORDER — PEG 3350-KCL-NA BICARB-NACL 420 G PO SOLR
4000.0000 mL | Freq: Once | ORAL | 0 refills | Status: AC
Start: 2019-09-19 — End: 2019-09-19

## 2019-09-19 NOTE — Progress Notes (Signed)
Gastroenterology Pre-Procedure Review  Request Date: Tuesday 10/01/19 Requesting Physician: Dr. [name]  PATIENT REVIEW QUESTIONS: The patient responded to the following health history questions as indicated:    1. Are you having any GI issues? yes (early morning upset stomach) 2. Do you have a personal history of Polyps? yes (02/11/16 colonoscopy performed by Dr. [name] noted colon polyps) 3. Do you have a family history of Colon Cancer or Polyps? no 4. Diabetes Mellitus? no 5. Joint replacements in the past 12 months?no 6. Major health problems in the past 3 months?yes (April 2021 Laparoscopic Bariatric Surgery performed at [facility].  Pt had a Duodenum switch performed.  On May 8th patient states she had a blockage to her small intestines and had to be hospitalized.) 7. Any artificial heart valves, MVP, or defibrillator?no    MEDICATIONS & ALLERGIES:    Patient reports the following regarding taking any anticoagulation/antiplatelet therapy:   Plavix, Coumadin, Eliquis, Xarelto, Lovenox, Pradaxa, Brilinta, or Effient? no Aspirin? yes (81 mg daily)  Patient confirms/reports the following medications:  Current Outpatient Medications  Medication Sig Dispense Refill  . acetaminophen (TYLENOL) 500 MG tablet Take by mouth.    [name] Kitchen acyclovir (ZOVIRAX) 400 MG tablet TAKE 1 TABLET BY MOUTH EVERY DAY 30 tablet 5  . Ascorbic Acid (VITAMIN C) 1000 MG tablet Take 1,000 mg by mouth daily.    [name] Kitchen aspirin 81 MG tablet Take 81 mg by mouth daily.    [name] Kitchen azelastine (ASTELIN) 0.1 % nasal spray USE 1 SPRAY EACH NOSTRIL TWICE A DAY AS NEEDED FOR ALLERGIES    . fexofenadine (ALLEGRA) 180 MG tablet Take 1 tablet (180 mg total) by mouth daily. 30 tablet 3  . fluticasone (FLONASE) 50 MCG/ACT nasal spray PLACE 1 SPRAY INTO BOTH NOSTRILS DAILY. 16 mL 2  . gabapentin (NEURONTIN) 100 MG capsule Take 2 capsules (200 mg total) by mouth 3 (three) times daily. 90 capsule 5  . hydrocortisone (ANUSOL-HC) 25 MG suppository Place 1  suppository (25 mg total) rectally 2 (two) times daily as needed for hemorrhoids or anal itching. 12 suppository 2  . levothyroxine (SYNTHROID) 50 MCG tablet TAKE 1 TABLET BY MOUTH EVERY DAY 30 tablet 5  . Multiple Vitamins-Minerals (BARIATRIC MULTIVITAMINS/IRON PO) Take by mouth.    [name] Kitchen omeprazole (PRILOSEC) 20 MG capsule TAKE 1 CAPSULE BY MOUTH EVERY DAY 30 capsule 5  . Propylene Glycol (SYSTANE BALANCE) 0.6 % SOLN Apply to eye.    . traZODone (DESYREL) 100 MG tablet TAKE 1 TABLET BY MOUTH EVERYDAY AT BEDTIME 90 tablet 16   No current facility-administered medications for this visit.    Patient confirms/reports the following allergies:  Allergies  Allergen Reactions  . Celecoxib Hives  . Pollen Extract   . Nasacort [Triamcinolone] Other (See Comments)    Nasal - Nose Bleeds  . Vicodin [Hydrocodone-Acetaminophen] Nausea Only    No orders of the defined types were placed in this encounter.   AUTHORIZATION INFORMATION Primary Insurance: 1D#: Group #:  Secondary Insurance: 1D#: Group #:  SCHEDULE INFORMATION: Date: 10/01/19 Time: Location:[facility]

## 2019-09-24 ENCOUNTER — Other Ambulatory Visit: Payer: Self-pay

## 2019-09-24 ENCOUNTER — Telehealth: Payer: Self-pay

## 2019-09-24 MED ORDER — CLENPIQ 10-3.5-12 MG-GM -GM/160ML PO SOLN
1.0000 | Freq: Once | ORAL | 0 refills | Status: AC
Start: 2019-09-24 — End: 2019-09-24

## 2019-09-24 NOTE — Telephone Encounter (Signed)
Patient has been advised of Colonoscopy prep change to ClenPiq.  Instructions for ClenPiq provided.  Pt verbalized understanding of how to take bowel prep.  I asked her to call me back if she has any questions.  Thank you,  Sharyn Lull, CMA

## 2019-09-27 ENCOUNTER — Other Ambulatory Visit
Admission: RE | Admit: 2019-09-27 | Discharge: 2019-09-27 | Disposition: A | Discharge status: Home / Self Care | Payer: Medicare Other | Source: Ambulatory Visit | Attending: Gastroenterology | Admitting: Gastroenterology

## 2019-09-27 ENCOUNTER — Other Ambulatory Visit: Payer: Self-pay

## 2019-09-27 DIAGNOSIS — Z01812 Encounter for preprocedural laboratory examination: Secondary | ICD-10-CM | POA: Diagnosis not present

## 2019-09-27 DIAGNOSIS — Z20822 Contact with and (suspected) exposure to covid-19: Secondary | ICD-10-CM | POA: Insufficient documentation

## 2019-09-27 DIAGNOSIS — K219 Gastro-esophageal reflux disease without esophagitis: Secondary | ICD-10-CM | POA: Diagnosis not present

## 2019-09-27 DIAGNOSIS — J301 Allergic rhinitis due to pollen: Secondary | ICD-10-CM | POA: Diagnosis not present

## 2019-09-27 DIAGNOSIS — J3489 Other specified disorders of nose and nasal sinuses: Secondary | ICD-10-CM | POA: Diagnosis not present

## 2019-09-27 LAB — SARS CORONAVIRUS 2 (TAT 6-24 HRS): SARS Coronavirus 2: NEGATIVE

## 2019-09-30 ENCOUNTER — Encounter: Payer: Self-pay | Admitting: Gastroenterology

## 2019-10-01 ENCOUNTER — Ambulatory Visit: Payer: Medicare Other | Admitting: Anesthesiology

## 2019-10-01 ENCOUNTER — Encounter
Admission: RE | Disposition: A | Discharge status: Home / Self Care | Payer: Self-pay | Source: Home / Self Care | Attending: Gastroenterology

## 2019-10-01 ENCOUNTER — Ambulatory Visit
Admission: RE | Admit: 2019-10-01 | Discharge: 2019-10-01 | Disposition: A | Discharge status: Home / Self Care | Payer: Medicare Other | Attending: Gastroenterology | Admitting: Gastroenterology

## 2019-10-01 ENCOUNTER — Encounter: Payer: Self-pay | Admitting: Gastroenterology

## 2019-10-01 ENCOUNTER — Other Ambulatory Visit: Payer: Self-pay

## 2019-10-01 DIAGNOSIS — Z79890 Hormone replacement therapy (postmenopausal): Secondary | ICD-10-CM | POA: Insufficient documentation

## 2019-10-01 DIAGNOSIS — E785 Hyperlipidemia, unspecified: Secondary | ICD-10-CM | POA: Diagnosis not present

## 2019-10-01 DIAGNOSIS — I10 Essential (primary) hypertension: Secondary | ICD-10-CM | POA: Diagnosis not present

## 2019-10-01 DIAGNOSIS — K219 Gastro-esophageal reflux disease without esophagitis: Secondary | ICD-10-CM | POA: Diagnosis not present

## 2019-10-01 DIAGNOSIS — K635 Polyp of colon: Secondary | ICD-10-CM

## 2019-10-01 DIAGNOSIS — E669 Obesity, unspecified: Secondary | ICD-10-CM | POA: Diagnosis not present

## 2019-10-01 DIAGNOSIS — Z1211 Encounter for screening for malignant neoplasm of colon: Secondary | ICD-10-CM | POA: Diagnosis not present

## 2019-10-01 DIAGNOSIS — F329 Major depressive disorder, single episode, unspecified: Secondary | ICD-10-CM | POA: Insufficient documentation

## 2019-10-01 DIAGNOSIS — Z79899 Other long term (current) drug therapy: Secondary | ICD-10-CM | POA: Insufficient documentation

## 2019-10-01 DIAGNOSIS — D126 Benign neoplasm of colon, unspecified: Secondary | ICD-10-CM | POA: Diagnosis not present

## 2019-10-01 DIAGNOSIS — Z87891 Personal history of nicotine dependence: Secondary | ICD-10-CM | POA: Insufficient documentation

## 2019-10-01 DIAGNOSIS — Z8249 Family history of ischemic heart disease and other diseases of the circulatory system: Secondary | ICD-10-CM | POA: Insufficient documentation

## 2019-10-01 DIAGNOSIS — Z9884 Bariatric surgery status: Secondary | ICD-10-CM | POA: Insufficient documentation

## 2019-10-01 DIAGNOSIS — G4730 Sleep apnea, unspecified: Secondary | ICD-10-CM | POA: Diagnosis not present

## 2019-10-01 DIAGNOSIS — D122 Benign neoplasm of ascending colon: Secondary | ICD-10-CM | POA: Diagnosis not present

## 2019-10-01 DIAGNOSIS — Z7982 Long term (current) use of aspirin: Secondary | ICD-10-CM | POA: Insufficient documentation

## 2019-10-01 DIAGNOSIS — Z86010 Personal history of colonic polyps: Secondary | ICD-10-CM | POA: Diagnosis not present

## 2019-10-01 DIAGNOSIS — E039 Hypothyroidism, unspecified: Secondary | ICD-10-CM | POA: Diagnosis not present

## 2019-10-01 DIAGNOSIS — Z6834 Body mass index (BMI) 34.0-34.9, adult: Secondary | ICD-10-CM | POA: Insufficient documentation

## 2019-10-01 DIAGNOSIS — G4733 Obstructive sleep apnea (adult) (pediatric): Secondary | ICD-10-CM | POA: Diagnosis not present

## 2019-10-01 DIAGNOSIS — M1990 Unspecified osteoarthritis, unspecified site: Secondary | ICD-10-CM | POA: Diagnosis not present

## 2019-10-01 HISTORY — DX: Cardiac arrhythmia, unspecified: I49.9

## 2019-10-01 HISTORY — PX: COLONOSCOPY WITH PROPOFOL: SHX5780

## 2019-10-01 SURGERY — COLONOSCOPY WITH PROPOFOL
Anesthesia: General

## 2019-10-01 MED ORDER — SODIUM CHLORIDE 0.9 % IV SOLN: INTRAVENOUS | Status: DC

## 2019-10-01 MED ORDER — LIDOCAINE HCL (PF) 2 % IJ SOLN
INTRAMUSCULAR | Status: AC
  Filled 2019-10-01: qty 5

## 2019-10-01 MED ORDER — LIDOCAINE HCL (CARDIAC) PF 100 MG/5ML IV SOSY
PREFILLED_SYRINGE | INTRAVENOUS | Status: DC | PRN
  Administered 2019-10-01: 50 mg via INTRAVENOUS

## 2019-10-01 MED ORDER — PROPOFOL 500 MG/50ML IV EMUL
INTRAVENOUS | Status: DC | PRN
  Administered 2019-10-01: 125 ug/kg/min via INTRAVENOUS

## 2019-10-01 MED ORDER — PROPOFOL 10 MG/ML IV BOLUS
INTRAVENOUS | Status: DC | PRN
  Administered 2019-10-01: 100 mg via INTRAVENOUS

## 2019-10-01 MED ORDER — PROPOFOL 500 MG/50ML IV EMUL
INTRAVENOUS | Status: AC
  Filled 2019-10-01: qty 50

## 2019-10-01 NOTE — Transfer of Care (Signed)
Immediate Anesthesia Transfer of Care Note  Patient: [name]  Procedure(s) Performed: COLONOSCOPY WITH PROPOFOL (N/A )  Patient Location: PACU  Anesthesia Type:MAC  Level of Consciousness: awake, alert  and oriented  Airway & Oxygen Therapy: Patient Spontanous Breathing  Post-op Assessment: Post -op Vital signs reviewed and stable  Post vital signs: stable  Last Vitals:  Vitals Value Taken Time  BP 122/68 10/01/19 1018  Temp 36.7 C 10/01/19 1017  Pulse 58 10/01/19 1018  Resp 21 10/01/19 1018  SpO2 100 % 10/01/19 1018  Vitals shown include unvalidated device data.  Last Pain:  Vitals:   10/01/19 1017  TempSrc: Temporal  PainSc: 0-No pain         Complications: No complications documented.

## 2019-10-01 NOTE — H&P (Signed)
Jonathon Bellows, MD [address], [place], [place], [place], [address] [address], [place], [place], [place], [address] Phone: [phone]  Fax: [phone]  Primary Care Physician:  [name], FNP   Pre-Procedure History & Physical: HPI:  Jennifer [name] is a 69 y.o. female is here for an colonoscopy.   Past Medical History:  Diagnosis Date  . Anxiety   . Bariatric surgery status   . Constipation   . Dysrhythmia   . Elevated liver enzymes   . Hemorrhoids   . Herpes genitalis   . High cholesterol   . Hyperlipidemia   . Hypertension   . Hypothyroidism   . Osteoarthritis   . Sleep apnea     Past Surgical History:  Procedure Laterality Date  . ABDOMINAL HYSTERECTOMY     total for fibroids no h/o abnormal pap  . bariatric sleeve  2015  . BREAST EXCISIONAL BIOPSY Left 1998  . carpal tunnel repair    . COLONOSCOPY WITH PROPOFOL N/A 02/11/2016   Procedure: COLONOSCOPY WITH PROPOFOL;  Surgeon: Jonathon Bellows, MD;  [facility] ENDOSCOPY;  Service: Endoscopy;  Laterality: N/A;  . HEMORRHOID SURGERY      Prior to Admission medications   Medication Sig Start Date End Date Taking? Authorizing Provider  levothyroxine (SYNTHROID) 50 MCG tablet TAKE 1 TABLET BY MOUTH EVERY DAY 05/06/19  Yes Arnett, Yvetta Coder, FNP  losartan (COZAAR) 100 MG tablet Take 100 mg by mouth daily.   Yes [provider]  omeprazole (PRILOSEC) 20 MG capsule TAKE 1 CAPSULE BY MOUTH EVERY DAY 04/24/19  Yes [name], FNP  traZODone (DESYREL) 100 MG tablet TAKE 1 TABLET BY MOUTH EVERYDAY AT BEDTIME 11/12/18  Yes [name], FNP  acetaminophen (TYLENOL) 500 MG tablet Take by mouth.    [provider]  acyclovir (ZOVIRAX) 400 MG tablet TAKE 1 TABLET BY MOUTH EVERY DAY 05/06/19   [name], FNP  Ascorbic Acid (VITAMIN C) 1000 MG tablet Take 1,000 mg by mouth daily.    [provider]  aspirin 81 MG tablet Take 81 mg by mouth daily.    [provider]  azelastine (ASTELIN) 0.1 % nasal spray USE 1 SPRAY EACH NOSTRIL TWICE A DAY AS NEEDED FOR ALLERGIES 08/09/18   [provider]  fexofenadine (ALLEGRA) 180 MG tablet Take 1 tablet (180 mg total) by mouth daily. 01/14/19   [name], FNP  fluticasone (FLONASE) 50 MCG/ACT nasal spray PLACE 1 SPRAY INTO BOTH NOSTRILS DAILY. 09/09/19   [name], FNP  gabapentin (NEURONTIN) 100 MG capsule TAKE 2 CAPSULES (200 MG TOTAL) BY MOUTH 3 (THREE) TIMES DAILY. 09/19/19   [name], FNP  hydrocortisone (ANUSOL-HC) 25 MG suppository Place 1 suppository (25 mg total) rectally 2 (two) times daily as needed for hemorrhoids or anal itching. 03/27/17   [name], FNP  Multiple Vitamins-Minerals (BARIATRIC MULTIVITAMINS/IRON PO) Take by mouth.    [provider]  Propylene Glycol (SYSTANE BALANCE) 0.6 % SOLN Apply to eye.    [provider]    Allergies as of 09/19/2019 - Review Complete 09/19/2019  Allergen Reaction Noted  . Celecoxib Hives 07/25/2016  . Pollen extract    . Nasacort [triamcinolone] Other (See Comments) 12/07/2015  . Vicodin [hydrocodone-acetaminophen] Nausea Only 09/03/2014    Family History  Problem Relation Age of Onset  . Breast cancer Sister 76       materal 1/2 sister  . Hypertension Sister   .  Hypertension Mother   . Heart disease Father   . Hypertension Brother     Social History   Socioeconomic History  . Marital status: Married    Spouse name: Not on file  . Number of children: Not on file  . Years of education: Not on file  . Highest education level: Not on file  Occupational History  . Not on file  Tobacco Use  . Smoking status: Former Smoker    Packs/day: 1.50    Years: 24.00    Pack years: 36.00    Types: Cigarettes    Quit date: 1993    Years since quitting: 28.7  . Smokeless tobacco: Never Used  . Tobacco comment: quit 1995.   Vaping Use  . Vaping Use: Never used  Substance and Sexual  Activity  . Alcohol use: Not Currently  . Drug use: No  . Sexual activity: Not Currently    Birth control/protection: Surgical    Comment: Hysterectomy  Other Topics Concern  . Not on file  Social History Narrative   Lives in [place].    Married.    Retired 2015, Interior and spatial designer.    One son; granddaughter.    Social Determinants of Health   Financial Resource Strain: Low Risk   . Difficulty of Paying Living Expenses: Not hard at all  Food Insecurity: No Food Insecurity  . Worried About Charity fundraiser in the Last Year: Never true  . Ran Out of Food in the Last Year: Never true  Transportation Needs: No Transportation Needs  . Lack of Transportation (Medical): No  . Lack of Transportation (Non-Medical): No  Physical Activity: Insufficiently Active  . Days of Exercise per Week: 3 days  . Minutes of Exercise per Session: 20 min  Stress: No Stress Concern Present  . Feeling of Stress : Not at all  Social Connections: Unknown  . Frequency of Communication with Friends and Family: More than three times a week  . Frequency of Social Gatherings with Friends and Family: More than three times a week  . Attends Religious Services: Not on file  . Active Member of Clubs or Organizations: Not on file  . Attends Archivist Meetings: Not on file  . Marital Status: Not on file  Intimate Partner Violence: Not At Risk  . Fear of Current or Ex-Partner: No  . Emotionally Abused: No  . Physically Abused: No  . Sexually Abused: No    Review of Systems: See HPI, otherwise negative ROS  Physical Exam: BP (!) 163/68   Pulse (!) 49   Temp (!) 96.6 F (35.9 C) (Temporal)   Resp 16   Ht 5\' 1"  (1.549 m)   Wt 83 kg   SpO2 100%   BMI 34.58 kg/m  General:   Alert,  pleasant and cooperative in NAD Head:  Normocephalic and atraumatic. Neck:  Supple; no masses or thyromegaly. Lungs:  Clear throughout to auscultation, normal respiratory effort.    Heart:  +S1, +S2,  Regular rate and rhythm, No edema. Abdomen:  Soft, nontender and nondistended. Normal bowel sounds, without guarding, and without rebound.   Neurologic:  Alert and  oriented x4;  grossly normal neurologically.  Impression/Plan: Jennifer [name] is here for an colonoscopy to be performed for surveillance due to prior history of colon polyps   Risks, benefits, limitations, and alternatives regarding  colonoscopy have been reviewed with the patient.  Questions have been answered.  All parties agreeable.   Jonathon Bellows,  MD  10/01/2019, 9:43 AM

## 2019-10-01 NOTE — Anesthesia Preprocedure Evaluation (Addendum)
Anesthesia Evaluation  Patient identified by MRN, date of birth, ID band Patient awake    Reviewed: Allergy & Precautions, H&P , NPO status , Patient's Chart, lab work & pertinent test results  History of Anesthesia Complications (+) history of anesthetic complications ("trouble breathing after gastric sleeve")  Airway Mallampati: III  TM Distance: >3 FB     Dental  (+) Teeth Intact   Pulmonary sleep apnea , neg COPD, former smoker,    breath sounds clear to auscultation       Cardiovascular hypertension, (-) angina(-) Past MI and (-) Cardiac Stents + dysrhythmias (bradycardia. Evaluated by EP, no pacemaker needed per pt) (-) pacemaker Rhythm:regular Rate:Normal     Neuro/Psych PSYCHIATRIC DISORDERS Anxiety negative neurological ROS     GI/Hepatic Neg liver ROS, GERD  ,NAFLD   Endo/Other  Hypothyroidism   Renal/GU negative Renal ROS  negative genitourinary   Musculoskeletal   Abdominal   Peds  Hematology negative hematology ROS (+)   Anesthesia Other Findings Obese  Past Medical History: No date: Anxiety No date: Bariatric surgery status No date: Constipation No date: Dysrhythmia No date: Elevated liver enzymes No date: Hemorrhoids No date: Herpes genitalis No date: High cholesterol No date: Hyperlipidemia No date: Hypertension No date: Hypothyroidism No date: Osteoarthritis No date: Sleep apnea  Past Surgical History: No date: ABDOMINAL HYSTERECTOMY     Comment:  total for fibroids no h/o abnormal pap 2015: bariatric sleeve 1998: BREAST EXCISIONAL BIOPSY; Left No date: carpal tunnel repair 02/11/2016: COLONOSCOPY WITH PROPOFOL; N/A     Comment:  Procedure: COLONOSCOPY WITH PROPOFOL;  Surgeon: Jonathon Bellows, MD;  Location: [facility] ENDOSCOPY;  Service: Endoscopy;              Laterality: N/A; No date: HEMORRHOID SURGERY  BMI    Body Mass Index: 34.58 kg/m       Reproductive/Obstetrics negative OB ROS                            Anesthesia Physical Anesthesia Plan  ASA: III  Anesthesia Plan: General   Post-op Pain Management:    Induction:   PONV Risk Score and Plan: Propofol infusion and TIVA  Airway Management Planned: Simple Face Mask  Additional Equipment:   Intra-op Plan:   Post-operative Plan:   Informed Consent: I have reviewed the patients History and Physical, chart, labs and discussed the procedure including the risks, benefits and alternatives for the proposed anesthesia with the patient or authorized representative who has indicated his/her understanding and acceptance.     Dental Advisory Given  Plan Discussed with: Anesthesiologist, CRNA and Surgeon  Anesthesia Plan Comments:         Anesthesia Quick Evaluation

## 2019-10-01 NOTE — Op Note (Signed)
[facility] Patient Name: Jennifer [name] Procedure Date: 10/01/2019 9:51 AM MRN: [id number] Account #: [id number] Date of Birth: 1950/10/21 Admit Type: Outpatient Age: 69 Room: [facility] ENDO ROOM 4 Gender: Female Note Status: Finalized Procedure:             Colonoscopy Indications:           High risk colon cancer surveillance: Personal history                         of colonic polyps, Surveillance: Personal history of                         adenomatous polyps on last colonoscopy 3 years ago,                         Last colonoscopy: January 2018 Providers:             Jonathon Bellows MD, MD Referring MD:          Yvetta Coder. Arnett (Referring MD) Medicines:             Monitored Anesthesia Care Complications:         No immediate complications. Procedure:             Pre-Anesthesia Assessment:                        - Prior to the procedure, a History and Physical was                         performed, and patient medications, allergies and                         sensitivities were reviewed. The patient's tolerance                         of previous anesthesia was reviewed.                        - The risks and benefits of the procedure and the                         sedation options and risks were discussed with the                         patient. All questions were answered and informed                         consent was obtained.                        - ASA Grade Assessment: II - A patient with mild                         systemic disease.                        After obtaining informed consent, the colonoscope was                         passed under direct vision. Throughout  the procedure,                         the patient's blood pressure, pulse, and oxygen                         saturations were monitored continuously. The                         Colonoscope was introduced through the anus and                         advanced to  the the cecum, identified by the                         appendiceal orifice. The colonoscopy was performed                         with ease. The patient tolerated the procedure well.                         The quality of the bowel preparation was excellent. Findings:      The perianal and digital rectal examinations were normal.      A 4 mm polyp was found in the ascending colon. The polyp was sessile.       The polyp was removed with a cold snare. Resection and retrieval were       complete.      The exam was otherwise without abnormality on direct and retroflexion       views. Impression:            - One 4 mm polyp in the ascending colon, removed with                         a cold snare. Resected and retrieved.                        - The examination was otherwise normal on direct and                         retroflexion views. Recommendation:        - Discharge patient to home (with escort).                        - Resume previous diet.                        - Continue present medications.                        - Await pathology results.                        - Repeat colonoscopy for surveillance based on                         pathology results. Procedure Code(s):     --- Professional ---                        [phone], Colonoscopy, flexible; with removal  of                         tumor(s), polyp(s), or other lesion(s) by snare                         technique Diagnosis Code(s):     --- Professional ---                        K63.5, Polyp of colon                        Z86.010, Personal history of colonic polyps CPT copyright 2019 American Medical Association. All rights reserved. The codes documented in this report are preliminary and upon coder review may  be revised to meet current compliance requirements. Jonathon Bellows, MD Jonathon Bellows MD, MD 10/01/2019 10:17:00 AM This report has been signed electronically. Number of Addenda: 0 Note Initiated On: 10/01/2019 9:51  AM Scope Withdrawal Time: 0 hours 12 minutes 13 seconds  Total Procedure Duration: 0 hours 15 minutes 57 seconds  Estimated Blood Loss:  Estimated blood loss: none.      [facility]

## 2019-10-01 NOTE — Anesthesia Postprocedure Evaluation (Signed)
Anesthesia Post Note  Patient: Research scientist (life sciences)  Procedure(s) Performed: COLONOSCOPY WITH PROPOFOL (N/A )  Patient location during evaluation: PACU Anesthesia Type: General Level of consciousness: awake and alert Pain management: pain level controlled Vital Signs Assessment: post-procedure vital signs reviewed and stable Respiratory status: spontaneous breathing, nonlabored ventilation and respiratory function stable Cardiovascular status: blood pressure returned to baseline and stable Postop Assessment: no apparent nausea or vomiting Anesthetic complications: no   No complications documented.   Last Vitals:  Vitals:   10/01/19 1017 10/01/19 1037  BP: 122/68 (!) 167/74  Pulse: 63   Resp: (!) 22   Temp: 36.7 C   SpO2: 100%     Last Pain:  Vitals:   10/01/19 1037  TempSrc:   PainSc: 0-No pain                 Brett Canales Kailer Heindel

## 2019-10-02 ENCOUNTER — Telehealth: Payer: Self-pay | Admitting: Family

## 2019-10-02 DIAGNOSIS — I10 Essential (primary) hypertension: Secondary | ICD-10-CM

## 2019-10-02 LAB — SURGICAL PATHOLOGY

## 2019-10-02 MED ORDER — HYDROCHLOROTHIAZIDE 12.5 MG PO CAPS: 12.5000 mg | ORAL_CAPSULE | Freq: Every day | ORAL | 0 refills | Status: DC

## 2019-10-02 NOTE — Addendum Note (Signed)
Addended by: [name] on: 10/02/2019 03:45 PM   Modules accepted: Orders

## 2019-10-02 NOTE — Telephone Encounter (Signed)
BP Readings from Last 3 Encounters:  10/01/19 (!) 167/74  09/06/19 140/73  08/29/19 (!) 149/74   Feels well today No ha, cp,sob. Thinks that diet changes have increased blood pressure  Tried amlodipine 2.5mg  ( had been off of this due to leg swelling) and losartan 100mg  over the weekend . Today was 148/70. Some swelling around ankles, using lymphapress.  No longer on lasix.   Plan:  Stop amlodipine Continue losartan Start hctz Labs one week 9/21 945 Advised to monitor BP and call with readings Any HA, cp, sob, she understands to go to ed for evaluation.

## 2019-10-02 NOTE — Telephone Encounter (Signed)
Pt called to let Joycelyn Schmid know that her BP has been running high for the last 3wks  It has been running between 174/90 - 165/80 Pt stated that her BP normally runs around 124/73

## 2019-10-02 NOTE — Telephone Encounter (Signed)
I called patient to triage. She stated that she has been taking meds regularly as prescribed, but BP still running higher. It was elevated when she went for coloscopy as well. She confirmed no chest pain, sob, arm or jaw pain, vision changes. She said that she had a slight HA, but thought it was allergy related & otherwise feels fine. No appointments available. Please advise?

## 2019-10-04 DIAGNOSIS — J301 Allergic rhinitis due to pollen: Secondary | ICD-10-CM | POA: Diagnosis not present

## 2019-10-07 ENCOUNTER — Encounter: Payer: Self-pay | Admitting: Gastroenterology

## 2019-10-08 ENCOUNTER — Other Ambulatory Visit: Payer: Self-pay

## 2019-10-08 ENCOUNTER — Other Ambulatory Visit (INDEPENDENT_AMBULATORY_CARE_PROVIDER_SITE_OTHER): Payer: Medicare Other

## 2019-10-08 DIAGNOSIS — Z79899 Other long term (current) drug therapy: Secondary | ICD-10-CM | POA: Diagnosis not present

## 2019-10-08 DIAGNOSIS — I10 Essential (primary) hypertension: Secondary | ICD-10-CM | POA: Diagnosis not present

## 2019-10-08 DIAGNOSIS — Z23 Encounter for immunization: Secondary | ICD-10-CM | POA: Diagnosis not present

## 2019-10-08 LAB — COMPREHENSIVE METABOLIC PANEL
ALT: 16 U/L (ref 0–35)
AST: 21 U/L (ref 0–37)
Albumin: 4.5 g/dL (ref 3.5–5.2)
Alkaline Phosphatase: 82 U/L (ref 39–117)
BUN: 12 mg/dL (ref 6–23)
CO2: 30 mEq/L (ref 19–32)
Calcium: 9.9 mg/dL (ref 8.4–10.5)
Chloride: 104 mEq/L (ref 96–112)
Creatinine, Ser: 0.8 mg/dL (ref 0.40–1.20)
GFR: 85.77 mL/min (ref >60.00)
Glucose, Bld: 100 mg/dL — ABNORMAL HIGH (ref 70–99)
Potassium: 4.6 mEq/L (ref 3.5–5.1)
Sodium: 141 mEq/L (ref 135–145)
Total Bilirubin: 0.5 mg/dL (ref 0.2–1.2)
Total Protein: 6.7 g/dL (ref 6.0–8.3)

## 2019-10-09 ENCOUNTER — Other Ambulatory Visit: Payer: Medicare Other

## 2019-10-10 ENCOUNTER — Telehealth: Payer: Self-pay | Admitting: Gastroenterology

## 2019-10-10 DIAGNOSIS — J301 Allergic rhinitis due to pollen: Secondary | ICD-10-CM | POA: Diagnosis not present

## 2019-10-10 NOTE — Telephone Encounter (Signed)
Patient came into the office wanting to know if she could speak with the [place] in person about hemorrhoids and about getting a hemorrhoid prescription sent to her Pharmacy. Patient also wants to know when she can get an office appointment.Clinical staff was informed.

## 2019-10-11 MED ORDER — HYDROCORTISONE ACETATE 25 MG RE SUPP: 25.0000 mg | Freq: Every day | RECTAL | 0 refills | Status: AC

## 2019-10-11 NOTE — Telephone Encounter (Signed)
Pt has been notified and agrees to try suppository.

## 2019-10-11 NOTE — Telephone Encounter (Signed)
Needs patient information on hemorroids Anusol suppository once at night for 7 nights Office visit as she has never been seen at the office to discuss this

## 2019-10-15 ENCOUNTER — Other Ambulatory Visit: Payer: Self-pay | Admitting: Family

## 2019-10-15 DIAGNOSIS — J3489 Other specified disorders of nose and nasal sinuses: Secondary | ICD-10-CM

## 2019-10-15 DIAGNOSIS — R6889 Other general symptoms and signs: Secondary | ICD-10-CM

## 2019-10-16 ENCOUNTER — Ambulatory Visit: Payer: Medicare Other | Admitting: Gastroenterology

## 2019-10-21 ENCOUNTER — Encounter: Payer: Self-pay | Admitting: Family

## 2019-10-21 ENCOUNTER — Other Ambulatory Visit: Payer: Self-pay

## 2019-10-21 ENCOUNTER — Ambulatory Visit (INDEPENDENT_AMBULATORY_CARE_PROVIDER_SITE_OTHER): Payer: Medicare Other | Admitting: Family

## 2019-10-21 VITALS — BP 140/82 | HR 67 | Temp 98.7°F | Ht 60.98 in | Wt 183.4 lb

## 2019-10-21 DIAGNOSIS — M7989 Other specified soft tissue disorders: Secondary | ICD-10-CM

## 2019-10-21 DIAGNOSIS — K649 Unspecified hemorrhoids: Secondary | ICD-10-CM

## 2019-10-21 DIAGNOSIS — J301 Allergic rhinitis due to pollen: Secondary | ICD-10-CM | POA: Diagnosis not present

## 2019-10-21 DIAGNOSIS — Z7982 Long term (current) use of aspirin: Secondary | ICD-10-CM | POA: Diagnosis not present

## 2019-10-21 DIAGNOSIS — I10 Essential (primary) hypertension: Secondary | ICD-10-CM | POA: Diagnosis not present

## 2019-10-21 MED ORDER — LOSARTAN POTASSIUM 100 MG PO TABS: 100.0000 mg | ORAL_TABLET | Freq: Every day | ORAL | 2 refills | Status: DC

## 2019-10-21 NOTE — Progress Notes (Signed)
Subjective:    Patient ID: Jennifer [name], female    DOB: 11/14/1950, 69 y.o.   MRN: [id number]  CC: Jennifer [name] is a 69 y.o. female who presents today for follow up.   HPI:   HTN- compliant with losartan. Notes stress at home and  At [facility] 129/78. No cp.   Using lymphapress and thinks helping with BLE edema. Using gabapentin for neuropathy.   Continues to loose weight after gastric bypass revision with Dr Darnell Level earlier this year.   Complains of hemorrhoids for 3-4 weeks. [name] prescribed anusol suppository 1 week ago however resolution. Somewhat tender.  No blood in stool or when wiping. Some itching. No constipation nor hard formed stools.  No abdominal pain.    [name], recent colonoscopy 10/02/19; repeat in 5  Years;  follow up with [name] scheduled 12/23/19  HISTORY:  Past Medical History:  Diagnosis Date  . Anxiety   . Bariatric surgery status   . Constipation   . Dysrhythmia   . Elevated liver enzymes   . Hemorrhoids   . Herpes genitalis   . High cholesterol   . Hyperlipidemia   . Hypertension   . Hypothyroidism   . Osteoarthritis   . Sleep apnea    Past Surgical History:  Procedure Laterality Date  . ABDOMINAL HYSTERECTOMY     total for fibroids no h/o abnormal pap  . bariatric sleeve  2015  . BREAST EXCISIONAL BIOPSY Left 1998  . carpal tunnel repair    . COLONOSCOPY WITH PROPOFOL N/A 02/11/2016   Procedure: COLONOSCOPY WITH PROPOFOL;  Surgeon: Jonathon Bellows, MD;  Location: [facility] ENDOSCOPY;  Service: Endoscopy;  Laterality: N/A;  . COLONOSCOPY WITH PROPOFOL N/A 10/01/2019   Procedure: COLONOSCOPY WITH PROPOFOL;  Surgeon: Jonathon Bellows, MD;  Location: [facility] ENDOSCOPY;  Service: Gastroenterology;  Laterality: N/A;  . HEMORRHOID SURGERY     Family History  Problem Relation Age of Onset  . Breast cancer Sister 80       materal 1/2 sister  . Hypertension Sister   . Hypertension Mother   . Heart disease Father   . Hypertension Brother     Allergies:  Celecoxib, Pollen extract, Nasacort [triamcinolone], and Vicodin [hydrocodone-acetaminophen] Current Outpatient Medications on File Prior to Visit  Medication Sig Dispense Refill  . acetaminophen (TYLENOL) 500 MG tablet Take by mouth.    . Ascorbic Acid (VITAMIN C) 1000 MG tablet Take 1,000 mg by mouth daily.    [name] Kitchen aspirin 81 MG tablet Take 81 mg by mouth daily.    [name] Kitchen azelastine (ASTELIN) 0.1 % nasal spray USE 1 SPRAY EACH NOSTRIL TWICE A DAY AS NEEDED FOR ALLERGIES    . EPINEPHrine 0.3 mg/0.3 mL IJ SOAJ injection SMARTSIG:Injection    . fexofenadine (ALLEGRA) 180 MG tablet TAKE 1 TABLET BY MOUTH EVERY DAY 30 tablet 3  . fluticasone (FLONASE) 50 MCG/ACT nasal spray PLACE 1 SPRAY INTO BOTH NOSTRILS DAILY. 16 mL 2  . gabapentin (NEURONTIN) 100 MG capsule TAKE 2 CAPSULES (200 MG TOTAL) BY MOUTH 3 (THREE) TIMES DAILY. 90 capsule 5  . hydrochlorothiazide (MICROZIDE) 12.5 MG capsule Take 1 capsule (12.5 mg total) by mouth daily. 90 capsule 0  . hydrocortisone (ANUSOL-HC) 25 MG suppository Place 1 suppository (25 mg total) rectally 2 (two) times daily as needed for hemorrhoids or anal itching. 12 suppository 2  . Multiple Vitamins-Minerals (BARIATRIC MULTIVITAMINS/IRON PO) Take by mouth.    [name] Kitchen omeprazole (PRILOSEC) 20 MG capsule TAKE 1 CAPSULE BY MOUTH EVERY DAY  30 capsule 5  . Propylene Glycol (SYSTANE BALANCE) 0.6 % SOLN Apply to eye.    . traZODone (DESYREL) 100 MG tablet TAKE 1 TABLET BY MOUTH EVERYDAY AT BEDTIME 90 tablet 16   No current facility-administered medications on file prior to visit.    Social History   Tobacco Use  . Smoking status: Former Smoker    Packs/day: 1.50    Years: 24.00    Pack years: 36.00    Types: Cigarettes    Quit date: 1993    Years since quitting: 28.7  . Smokeless tobacco: Never Used  . Tobacco comment: quit 1995.   Vaping Use  . Vaping Use: Never used  Substance Use Topics  . Alcohol use: Not Currently  . Drug use: No    Review of Systems    Constitutional: Negative for chills and fever.  Respiratory: Negative for cough.   Cardiovascular: Negative for chest pain and palpitations.  Gastrointestinal: Negative for abdominal pain, anal bleeding, blood in stool, nausea and vomiting.      Objective:    BP 140/82 (BP Location: Left Arm, Patient Position: Sitting)   Pulse 67   Temp 98.7 F (37.1 C)   Ht 5' 0.98" (1.549 m)   Wt 183 lb 6.4 oz (83.2 kg)   SpO2 97%   BMI 34.67 kg/m  BP Readings from Last 3 Encounters:  10/21/19 140/82  10/01/19 (!) 167/74  09/06/19 140/73   Wt Readings from Last 3 Encounters:  10/21/19 183 lb 6.4 oz (83.2 kg)  10/01/19 183 lb (83 kg)  09/06/19 188 lb (85.3 kg)    Physical Exam Vitals reviewed.  Constitutional:      Appearance: She is well-developed.  Eyes:     Conjunctiva/sclera: Conjunctivae normal.  Cardiovascular:     Rate and Rhythm: Normal rate and regular rhythm.     Pulses: Normal pulses.     Heart sounds: Normal heart sounds.  Pulmonary:     Effort: Pulmonary effort is normal.     Breath sounds: Normal breath sounds. No wheezing, rhonchi or rales.  Skin:    General: Skin is warm and dry.  Neurological:     Mental Status: She is alert.  Psychiatric:        Speech: Speech normal.        Behavior: Behavior normal.        Thought Content: Thought content normal.        Assessment & Plan:   Problem List Items Addressed This Visit      Cardiovascular and Mediastinum   Essential hypertension - Primary    Elevated. Advised to start losartan 100mg  at night to see if effective in lowering blood pressure. She will keep blood pressure log at home.       Relevant Medications   EPINEPHrine 0.3 mg/0.3 mL IJ SOAJ injection   losartan (COZAAR) 100 MG tablet   Hemorrhoids    Declines exam today. Advised to give anu sol more time and to schedule sooner follow up with [name] or colleague to discuss options, including surgical.       Relevant Medications   EPINEPHrine 0.3  mg/0.3 mL IJ SOAJ injection   losartan (COZAAR) 100 MG tablet     Other   Aspirin long-term use     discussed ASA use at length today for primary prevention. Expressed concern for gastritis in setting of gastric sleeve and increased risk of GIB as 69 years old. Advised that she further have this discussion  with dr Clayborn Bigness and dr Darnell Level for their advice in continuing. She verbalized understanding of all.        Leg swelling    Improved . continue hctz 12.mg qd and lymphapress. She will continue following with vascular as well.           I have changed Candida Pavone's losartan. I am also having her maintain her aspirin, hydrocortisone, azelastine, traZODone, omeprazole, acetaminophen, vitamin C, fluticasone, Multiple Vitamins-Minerals (BARIATRIC MULTIVITAMINS/IRON PO), Systane Balance, gabapentin, hydrochlorothiazide, fexofenadine, and EPINEPHrine.   Meds ordered this encounter  Medications  . losartan (COZAAR) 100 MG tablet    Sig: Take 1 tablet (100 mg total) by mouth at bedtime.    Dispense:  90 tablet    Refill:  2    Return precautions given.   Risks, benefits, and alternatives of the medications and treatment plan prescribed today were discussed, and patient expressed understanding.   Education regarding symptom management and diagnosis given to patient on AVS.  Continue to follow with [name], FNP for routine health maintenance.   Jennifer [name] and I agreed with plan.   [name], FNP

## 2019-10-21 NOTE — Patient Instructions (Addendum)
Start losartan 100mg  AT NIGHT  It is imperative that you are seen AT least twice per year for labs and monitoring. Monitor blood pressure at home and me 5-6 reading on separate days. Goal is less than 120/80, based on newest guidelines, however we certainly want to be less than 130/80;  if persistently higher, please make sooner follow up appointment so we can recheck you blood pressure and manage/ adjust medications.  You need these labs:   a1c Lipid panel TSH  Ask Dr Darnell Level if you are okay to take aspirin with your surgery.  Please also ask dr Clayborn Bigness his opinion.   As discussed, for now we will continue aspirin 81mg  with close attention to benefit versus risk.  Benefit being reduced risk of ischemic stroke, non fatal heart attack and colorectal cancer ( if used for 10 years). However these benefits are most apparent in adults aged 73 to 31 years with a ?10% 10-year cardiovasular risk . Persons who are not at increased risk for bleeding, have a life expectancy of at least 10 years, and are willing to take low-dose aspirin daily for at least 10 years are more likely to benefit. Persons who place a higher value on the potential benefits than the potential harms may choose to initiate low-dose aspirin.  At 70 years and above the risk of gastrointestinal bleeding, falls,  hemorrhagic stroke increases therefore becoming a very personal discussion in regards to whether you continue aspirin 81 mg.   For now , we have opted to continue however please bring to my attention at follow so we can continue to have this discussion.

## 2019-10-22 ENCOUNTER — Other Ambulatory Visit: Payer: Self-pay | Admitting: Family

## 2019-10-22 DIAGNOSIS — Z0000 Encounter for general adult medical examination without abnormal findings: Secondary | ICD-10-CM

## 2019-10-22 DIAGNOSIS — E039 Hypothyroidism, unspecified: Secondary | ICD-10-CM

## 2019-10-22 DIAGNOSIS — Z7982 Long term (current) use of aspirin: Secondary | ICD-10-CM | POA: Insufficient documentation

## 2019-10-22 NOTE — Assessment & Plan Note (Signed)
Elevated. Advised to start losartan 100mg  at night to see if effective in lowering blood pressure. She will keep blood pressure log at home.

## 2019-10-22 NOTE — Assessment & Plan Note (Signed)
discussed ASA use at length today for primary prevention. Expressed concern for gastritis in setting of gastric sleeve and increased risk of GIB as 69 years old. Advised that she further have this discussion with dr Clayborn Bigness and dr Darnell Level for their advice in continuing. She verbalized understanding of all.

## 2019-10-22 NOTE — Assessment & Plan Note (Addendum)
Improved . continue hctz 12.mg qd and lymphapress. She will continue following with vascular as well.

## 2019-10-22 NOTE — Assessment & Plan Note (Signed)
Declines exam today. Advised to give anu sol more time and to schedule sooner follow up with [name] or colleague to discuss options, including surgical.

## 2019-10-23 DIAGNOSIS — Z9884 Bariatric surgery status: Secondary | ICD-10-CM | POA: Diagnosis not present

## 2019-10-23 DIAGNOSIS — K219 Gastro-esophageal reflux disease without esophagitis: Secondary | ICD-10-CM | POA: Diagnosis not present

## 2019-10-23 DIAGNOSIS — G4733 Obstructive sleep apnea (adult) (pediatric): Secondary | ICD-10-CM | POA: Diagnosis not present

## 2019-10-24 ENCOUNTER — Telehealth: Payer: Self-pay

## 2019-10-24 NOTE — Telephone Encounter (Signed)
Pt lvm stating the anusol suppository has not helped with her hemorrhoids. Pt is requesting Dr. Georgeann Oppenheim advice on what to try next?

## 2019-10-26 NOTE — Telephone Encounter (Signed)
Office visit to discuss further and provide options including banding of hemorroids

## 2019-10-28 ENCOUNTER — Telehealth: Payer: Self-pay | Admitting: Gastroenterology

## 2019-10-28 DIAGNOSIS — J301 Allergic rhinitis due to pollen: Secondary | ICD-10-CM | POA: Diagnosis not present

## 2019-10-28 NOTE — Telephone Encounter (Signed)
Patient walked in this morning to let Dr. [name] know that the prescription Anusol is not working and could he call in something stronger. Also patient wants to be seen sooner than her 12/23/19 appt. Clinical staff was informed.

## 2019-10-31 DIAGNOSIS — J301 Allergic rhinitis due to pollen: Secondary | ICD-10-CM | POA: Diagnosis not present

## 2019-11-04 DIAGNOSIS — J301 Allergic rhinitis due to pollen: Secondary | ICD-10-CM | POA: Diagnosis not present

## 2019-11-04 NOTE — Telephone Encounter (Signed)
Nov 8th afternoon I can see her

## 2019-11-06 ENCOUNTER — Other Ambulatory Visit: Payer: Self-pay | Admitting: Family

## 2019-11-13 DIAGNOSIS — J301 Allergic rhinitis due to pollen: Secondary | ICD-10-CM | POA: Diagnosis not present

## 2019-11-19 ENCOUNTER — Other Ambulatory Visit: Payer: Self-pay | Admitting: Family

## 2019-11-20 DIAGNOSIS — J301 Allergic rhinitis due to pollen: Secondary | ICD-10-CM | POA: Diagnosis not present

## 2019-11-21 ENCOUNTER — Encounter: Payer: Self-pay | Admitting: Gastroenterology

## 2019-11-21 ENCOUNTER — Ambulatory Visit (INDEPENDENT_AMBULATORY_CARE_PROVIDER_SITE_OTHER): Payer: Medicare Other | Admitting: Gastroenterology

## 2019-11-21 ENCOUNTER — Other Ambulatory Visit: Payer: Self-pay

## 2019-11-21 VITALS — BP 149/63 | HR 78 | Ht 60.0 in | Wt 186.4 lb

## 2019-11-21 DIAGNOSIS — K219 Gastro-esophageal reflux disease without esophagitis: Secondary | ICD-10-CM

## 2019-11-21 DIAGNOSIS — K5902 Outlet dysfunction constipation: Secondary | ICD-10-CM

## 2019-11-21 NOTE — Progress Notes (Signed)
Jonathon Bellows MD, MRCP(U.K) [address]  [place]  [place], [place] [address]  Main: [phone]  Fax: [phone]   Gastroenterology Consultation  Referring Provider:     [name], FNP Primary Care Physician:  [name], FNP Primary Gastroenterologist:  Dr. Jonathon Bellows  Reason for Consultation: Hemorrhoids        HPI:   Jennifer [name] is a 69 y.o. y/o female referred for consultation & management  by  [name], FNP.    10/01/2019: Colonoscopy: Prior history of adenomatous polyps.  A 4 mm polyp noted in the ascending colon.  Resected.  On retroflexion internal hemorrhoids seen on pictures.  The polyp was a tubular adenoma.  She called in on 10/10/2019 for issues with hemorrhoids commenced on Anusol.  He wanted to see me today to discuss about banding of hemorrhoids.  Recent she states she is here today to see me as every time she tries to have a bowel movement she not able to push on the stool.  When she does so it is watery.  Denies any rectal bleeding.  Denies any perianal discomfort.  It feels like a polyp protruding outwards.  Past Medical History:  Diagnosis Date  . Anxiety   . Bariatric surgery status   . Constipation   . Dysrhythmia   . Elevated liver enzymes   . Hemorrhoids   . Herpes genitalis   . High cholesterol   . Hyperlipidemia   . Hypertension   . Hypothyroidism   . Osteoarthritis   . Sleep apnea     Past Surgical History:  Procedure Laterality Date  . ABDOMINAL HYSTERECTOMY     total for fibroids no h/o abnormal pap  . bariatric sleeve  2015  . BREAST EXCISIONAL BIOPSY Left 1998  . carpal tunnel repair    . COLONOSCOPY WITH PROPOFOL N/A 02/11/2016   Procedure: COLONOSCOPY WITH PROPOFOL;  Surgeon: Jonathon Bellows, MD;  Location: [facility] ENDOSCOPY;  Service: Endoscopy;  Laterality: N/A;  . COLONOSCOPY WITH PROPOFOL N/A 10/01/2019   Procedure: COLONOSCOPY WITH PROPOFOL;  Surgeon: Jonathon Bellows, MD;  Location: [facility] ENDOSCOPY;   Service: Gastroenterology;  Laterality: N/A;  . HEMORRHOID SURGERY      Prior to Admission medications   Medication Sig Start Date End Date Taking? Authorizing Provider  acetaminophen (TYLENOL) 500 MG tablet Take by mouth.    [provider]  acyclovir (ZOVIRAX) 400 MG tablet TAKE 1 TABLET BY MOUTH EVERY DAY 10/22/19   [name], FNP  Ascorbic Acid (VITAMIN C) 1000 MG tablet Take 1,000 mg by mouth daily.    [provider]  aspirin 81 MG tablet Take 81 mg by mouth daily.    [provider]  azelastine (ASTELIN) 0.1 % nasal spray USE 1 SPRAY EACH NOSTRIL TWICE A DAY AS NEEDED FOR ALLERGIES 08/09/18   [provider]  EPINEPHrine 0.3 mg/0.3 mL IJ SOAJ injection SMARTSIG:Injection 10/07/19   [provider]  fexofenadine (ALLEGRA) 180 MG tablet TAKE 1 TABLET BY MOUTH EVERY DAY 10/15/19   [name], FNP  fluticasone (FLONASE) 50 MCG/ACT nasal spray PLACE 1 SPRAY INTO BOTH NOSTRILS DAILY. 09/09/19   [name], FNP  gabapentin (NEURONTIN) 100 MG capsule TAKE 2 CAPSULES (200 MG TOTAL) BY MOUTH 3 (THREE) TIMES DAILY. 09/19/19   [name], FNP  hydrochlorothiazide (MICROZIDE) 12.5 MG capsule Take 1 capsule (12.5 mg total) by mouth daily. 10/02/19   [name], FNP  hydrocortisone (ANUSOL-HC) 25  MG suppository Place 1 suppository (25 mg total) rectally 2 (two) times daily as needed for hemorrhoids or anal itching. 03/27/17   [name], FNP  levothyroxine (SYNTHROID) 50 MCG tablet TAKE 1 TABLET BY MOUTH EVERY DAY 10/22/19   [name], FNP  losartan (COZAAR) 100 MG tablet Take 1 tablet (100 mg total) by mouth at bedtime. 10/21/19   [name], FNP  Multiple Vitamins-Minerals (BARIATRIC MULTIVITAMINS/IRON PO) Take by mouth.    [provider]  omeprazole (PRILOSEC) 20 MG capsule TAKE 1 CAPSULE BY MOUTH EVERY DAY 11/19/19   [name], FNP  Propylene Glycol (SYSTANE BALANCE) 0.6 % SOLN Apply  to eye.    [provider]  traZODone (DESYREL) 100 MG tablet TAKE 1 TABLET BY MOUTH EVERYDAY AT BEDTIME 11/06/19   [name], FNP    Family History  Problem Relation Age of Onset  . Breast cancer Sister 36       materal 1/2 sister  . Hypertension Sister   . Hypertension Mother   . Heart disease Father   . Hypertension Brother      Social History   Tobacco Use  . Smoking status: Former Smoker    Packs/day: 1.50    Years: 24.00    Pack years: 36.00    Types: Cigarettes    Quit date: 1993    Years since quitting: 28.8  . Smokeless tobacco: Never Used  . Tobacco comment: quit 1995.   Vaping Use  . Vaping Use: Never used  Substance Use Topics  . Alcohol use: Not Currently  . Drug use: No    Allergies as of 11/21/2019 - Review Complete 10/21/2019  Allergen Reaction Noted  . Celecoxib Hives 07/25/2016  . Pollen extract    . Nasacort [triamcinolone] Other (See Comments) 12/07/2015  . Vicodin [hydrocodone-acetaminophen] Nausea Only 09/03/2014    Review of Systems:    All systems reviewed and negative except where noted in HPI.   Physical Exam:  There were no vitals taken for this visit. No LMP recorded. Patient has had a hysterectomy. Psych:  Alert and cooperative. Normal mood and affect. General:   Alert,  Well-developed, well-nourished, pleasant and cooperative in NAD Head:  Normocephalic and atraumatic. Abdomen:  Normal bowel sounds.  No bruits.  Soft, non-tender and non-distended without masses, hepatosplenomegaly or hernias noted.  No guarding or rebound tenderness.   A rectal exam with chaperone in the room demonstrated no abnormalities in the perianal area.  With insertion of my index finger I felt no internal masses or areas of tenderness.  Performed speculum examination and found no other abnormality. Neurologic:  Alert and oriented x3;  grossly normal neurologically. Psych:  Alert and cooperative. Normal mood and affect.  Imaging Studies: No  results found.  Assessment and Plan:   Jennifer [name] is a 69 y.o. y/o female has been referred for symptomatic hemorrhoids.  On history as well as examination she does not have any findings which would correlate with her history of inability to push out the stool.  She denies any constipation clearly.  Differentials include a degree of constipation versus dyssynergic defecation.  Plan 1.  Commence on fiber supplements samples will be provided, daily half a capful of MiraLAX. 2.  I will follow up in 4 weeks if no better she will need anorectal manometry to rule out dyssynergic defecation.  Follow up in 4 weeks telephone visit  Dr Jonathon Bellows MD,MRCP(U.K)

## 2019-11-23 ENCOUNTER — Other Ambulatory Visit: Payer: Self-pay | Admitting: Family

## 2019-11-23 DIAGNOSIS — I10 Essential (primary) hypertension: Secondary | ICD-10-CM

## 2019-11-25 ENCOUNTER — Ambulatory Visit: Payer: Medicare Other | Admitting: Gastroenterology

## 2019-11-27 DIAGNOSIS — J301 Allergic rhinitis due to pollen: Secondary | ICD-10-CM | POA: Diagnosis not present

## 2019-11-28 ENCOUNTER — Other Ambulatory Visit
Admission: RE | Admit: 2019-11-28 | Discharge: 2019-11-28 | Disposition: A | Discharge status: Home / Self Care | Payer: Medicare Other | Source: Ambulatory Visit | Attending: Gastroenterology | Admitting: Gastroenterology

## 2019-11-28 ENCOUNTER — Other Ambulatory Visit: Payer: Self-pay

## 2019-11-28 DIAGNOSIS — Z20822 Contact with and (suspected) exposure to covid-19: Secondary | ICD-10-CM | POA: Diagnosis not present

## 2019-11-28 DIAGNOSIS — Z01812 Encounter for preprocedural laboratory examination: Secondary | ICD-10-CM | POA: Insufficient documentation

## 2019-11-28 LAB — SARS CORONAVIRUS 2 (TAT 6-24 HRS): SARS Coronavirus 2: NEGATIVE

## 2019-12-02 ENCOUNTER — Ambulatory Visit: Payer: Medicare Other | Admitting: Registered Nurse

## 2019-12-02 ENCOUNTER — Other Ambulatory Visit: Payer: Self-pay

## 2019-12-02 ENCOUNTER — Encounter: Payer: Self-pay | Admitting: Gastroenterology

## 2019-12-02 ENCOUNTER — Encounter
Admission: RE | Disposition: A | Discharge status: Home / Self Care | Payer: Self-pay | Source: Home / Self Care | Attending: Gastroenterology

## 2019-12-02 ENCOUNTER — Ambulatory Visit
Admission: RE | Admit: 2019-12-02 | Discharge: 2019-12-02 | Disposition: A | Discharge status: Home / Self Care | Payer: Medicare Other | Attending: Gastroenterology | Admitting: Gastroenterology

## 2019-12-02 DIAGNOSIS — Z791 Long term (current) use of non-steroidal anti-inflammatories (NSAID): Secondary | ICD-10-CM | POA: Insufficient documentation

## 2019-12-02 DIAGNOSIS — E785 Hyperlipidemia, unspecified: Secondary | ICD-10-CM | POA: Diagnosis not present

## 2019-12-02 DIAGNOSIS — Z885 Allergy status to narcotic agent status: Secondary | ICD-10-CM | POA: Insufficient documentation

## 2019-12-02 DIAGNOSIS — G4730 Sleep apnea, unspecified: Secondary | ICD-10-CM | POA: Diagnosis not present

## 2019-12-02 DIAGNOSIS — Z803 Family history of malignant neoplasm of breast: Secondary | ICD-10-CM | POA: Diagnosis not present

## 2019-12-02 DIAGNOSIS — Z87891 Personal history of nicotine dependence: Secondary | ICD-10-CM | POA: Diagnosis not present

## 2019-12-02 DIAGNOSIS — J449 Chronic obstructive pulmonary disease, unspecified: Secondary | ICD-10-CM | POA: Diagnosis not present

## 2019-12-02 DIAGNOSIS — E7800 Pure hypercholesterolemia, unspecified: Secondary | ICD-10-CM | POA: Insufficient documentation

## 2019-12-02 DIAGNOSIS — K219 Gastro-esophageal reflux disease without esophagitis: Secondary | ICD-10-CM | POA: Diagnosis not present

## 2019-12-02 DIAGNOSIS — Z7982 Long term (current) use of aspirin: Secondary | ICD-10-CM | POA: Diagnosis not present

## 2019-12-02 DIAGNOSIS — Z9884 Bariatric surgery status: Secondary | ICD-10-CM | POA: Diagnosis not present

## 2019-12-02 DIAGNOSIS — I10 Essential (primary) hypertension: Secondary | ICD-10-CM | POA: Insufficient documentation

## 2019-12-02 DIAGNOSIS — G4733 Obstructive sleep apnea (adult) (pediatric): Secondary | ICD-10-CM | POA: Diagnosis not present

## 2019-12-02 DIAGNOSIS — Z980 Intestinal bypass and anastomosis status: Secondary | ICD-10-CM | POA: Insufficient documentation

## 2019-12-02 DIAGNOSIS — E039 Hypothyroidism, unspecified: Secondary | ICD-10-CM | POA: Diagnosis not present

## 2019-12-02 DIAGNOSIS — Z8249 Family history of ischemic heart disease and other diseases of the circulatory system: Secondary | ICD-10-CM | POA: Insufficient documentation

## 2019-12-02 DIAGNOSIS — Z888 Allergy status to other drugs, medicaments and biological substances status: Secondary | ICD-10-CM | POA: Diagnosis not present

## 2019-12-02 HISTORY — DX: Gastro-esophageal reflux disease without esophagitis: K21.9

## 2019-12-02 HISTORY — PX: ESOPHAGOGASTRODUODENOSCOPY (EGD) WITH PROPOFOL: SHX5813

## 2019-12-02 SURGERY — ESOPHAGOGASTRODUODENOSCOPY (EGD) WITH PROPOFOL
Anesthesia: General

## 2019-12-02 MED ORDER — PROPOFOL 500 MG/50ML IV EMUL
INTRAVENOUS | Status: DC | PRN
  Administered 2019-12-02: 150 ug/kg/min via INTRAVENOUS

## 2019-12-02 MED ORDER — SODIUM CHLORIDE 0.9 % IV SOLN: INTRAVENOUS | Status: DC

## 2019-12-02 MED ORDER — PROPOFOL 10 MG/ML IV BOLUS
INTRAVENOUS | Status: DC | PRN
  Administered 2019-12-02 (×2): 10 mg via INTRAVENOUS
  Administered 2019-12-02: 80 mg via INTRAVENOUS

## 2019-12-02 MED ORDER — GLYCOPYRROLATE 0.2 MG/ML IJ SOLN
INTRAMUSCULAR | Status: AC
  Filled 2019-12-02: qty 1

## 2019-12-02 MED ORDER — GLYCOPYRROLATE 0.2 MG/ML IJ SOLN
INTRAMUSCULAR | Status: DC | PRN
  Administered 2019-12-02: .2 mg via INTRAVENOUS

## 2019-12-02 MED ORDER — LIDOCAINE HCL (CARDIAC) PF 100 MG/5ML IV SOSY
PREFILLED_SYRINGE | INTRAVENOUS | Status: DC | PRN
  Administered 2019-12-02: 40 mg via INTRAVENOUS

## 2019-12-02 MED ORDER — LIDOCAINE HCL (PF) 2 % IJ SOLN
INTRAMUSCULAR | Status: AC
  Filled 2019-12-02: qty 5

## 2019-12-02 MED ORDER — PROPOFOL 500 MG/50ML IV EMUL
INTRAVENOUS | Status: AC
  Filled 2019-12-02: qty 50

## 2019-12-02 NOTE — H&P (Signed)
Jonathon Bellows, MD [address], [place], [place], [place], [address] [address], [place], [place], [place], [address] Phone: [phone]  Fax: [phone]  Primary Care Physician:  [name], FNP   Pre-Procedure History & Physical: HPI:  Jennifer [name] is a 69 y.o. female is here for an endoscopy    Past Medical History:  Diagnosis Date  . Anxiety   . Bariatric surgery status   . Constipation   . Dysrhythmia   . Elevated liver enzymes   . GERD (gastroesophageal reflux disease)   . Hemorrhoids   . Herpes genitalis   . High cholesterol   . Hyperlipidemia   . Hypertension   . Hypothyroidism   . Osteoarthritis   . Sleep apnea     Past Surgical History:  Procedure Laterality Date  . ABDOMINAL HYSTERECTOMY     total for fibroids no h/o abnormal pap  . bariatric sleeve  2015  . BREAST EXCISIONAL BIOPSY Left 1998  . carpal tunnel repair    . COLONOSCOPY WITH PROPOFOL N/A 02/11/2016   Procedure: COLONOSCOPY WITH PROPOFOL;  Surgeon: Jonathon Bellows, MD;  Location: [facility] ENDOSCOPY;  Service: Endoscopy;  Laterality: N/A;  . COLONOSCOPY WITH PROPOFOL N/A 10/01/2019   Procedure: COLONOSCOPY WITH PROPOFOL;  Surgeon: Jonathon Bellows, MD;  Location: [facility] ENDOSCOPY;  Service: Gastroenterology;  Laterality: N/A;  . HEMORRHOID SURGERY      Prior to Admission medications   Medication Sig Start Date End Date Taking? Authorizing Provider  acyclovir (ZOVIRAX) 400 MG tablet TAKE 1 TABLET BY MOUTH EVERY DAY 10/22/19  Yes [name], FNP  aspirin 81 MG tablet Take 81 mg by mouth daily.   Yes [provider]  fexofenadine (ALLEGRA) 180 MG tablet TAKE 1 TABLET BY MOUTH EVERY DAY 10/15/19  Yes Arnett, Yvetta Coder, FNP  fluticasone (FLONASE) 50 MCG/ACT nasal spray PLACE 1 SPRAY INTO BOTH NOSTRILS DAILY. 09/09/19  Yes [name], FNP  gabapentin (NEURONTIN) 100 MG capsule TAKE 2 CAPSULES (200 MG TOTAL) BY MOUTH 3 (THREE) TIMES DAILY. 09/19/19  Yes [name],  FNP  hydrochlorothiazide (MICROZIDE) 12.5 MG capsule TAKE 1 CAPSULE BY MOUTH EVERY DAY 11/25/19  Yes [name], FNP  levothyroxine (SYNTHROID) 50 MCG tablet TAKE 1 TABLET BY MOUTH EVERY DAY 10/22/19  Yes [name], FNP  omeprazole (PRILOSEC) 20 MG capsule TAKE 1 CAPSULE BY MOUTH EVERY DAY 11/19/19  Yes [name], FNP  traZODone (DESYREL) 100 MG tablet TAKE 1 TABLET BY MOUTH EVERYDAY AT BEDTIME 11/06/19  Yes [name], FNP  acetaminophen (TYLENOL) 500 MG tablet Take by mouth.    [provider]  Ascorbic Acid (VITAMIN C) 1000 MG tablet Take 1,000 mg by mouth daily.    [provider]  azelastine (ASTELIN) 0.1 % nasal spray USE 1 SPRAY EACH NOSTRIL TWICE A DAY AS NEEDED FOR ALLERGIES 08/09/18   [provider]  EPINEPHrine 0.3 mg/0.3 mL IJ SOAJ injection SMARTSIG:Injection 10/07/19   [provider]  hydrocortisone (ANUSOL-HC) 25 MG suppository Place 1 suppository (25 mg total) rectally 2 (two) times daily as needed for hemorrhoids or anal itching. 03/27/17   [name], FNP  losartan (COZAAR) 100 MG tablet Take 1 tablet (100 mg total) by mouth at bedtime. 10/21/19   [name], FNP  Multiple Vitamins-Minerals (BARIATRIC MULTIVITAMINS/IRON PO) Take by mouth.    [provider]  Propylene Glycol (SYSTANE BALANCE) 0.6 % SOLN Apply to eye.  [provider]    Allergies as of 11/21/2019 - Review Complete 11/21/2019  Allergen Reaction Noted  . Celecoxib Hives 07/25/2016  . Pollen extract    . Nasacort [triamcinolone] Other (See Comments) 12/07/2015  . Vicodin [hydrocodone-acetaminophen] Nausea Only 09/03/2014    Family History  Problem Relation Age of Onset  . Breast cancer Sister 30       materal 1/2 sister  . Hypertension Sister   . Hypertension Mother   . Heart disease Father   . Hypertension Brother     Social History   Socioeconomic History  . Marital status: Married    Spouse name:  Not on file  . Number of children: Not on file  . Years of education: Not on file  . Highest education level: Not on file  Occupational History  . Not on file  Tobacco Use  . Smoking status: Former Smoker    Packs/day: 1.50    Years: 24.00    Pack years: 36.00    Types: Cigarettes    Quit date: 1993    Years since quitting: 28.8  . Smokeless tobacco: Never Used  . Tobacco comment: quit 1995.   Vaping Use  . Vaping Use: Never used  Substance and Sexual Activity  . Alcohol use: Not Currently  . Drug use: No  . Sexual activity: Not Currently    Birth control/protection: Surgical    Comment: Hysterectomy  Other Topics Concern  . Not on file  Social History Narrative   Lives in [place].    Married.    Retired 2015, Interior and spatial designer.    One son; granddaughter.    Social Determinants of Health   Financial Resource Strain: Low Risk   . Difficulty of Paying Living Expenses: Not hard at all  Food Insecurity: No Food Insecurity  . Worried About Charity fundraiser in the Last Year: Never true  . Ran Out of Food in the Last Year: Never true  Transportation Needs: No Transportation Needs  . Lack of Transportation (Medical): No  . Lack of Transportation (Non-Medical): No  Physical Activity: Insufficiently Active  . Days of Exercise per Week: 3 days  . Minutes of Exercise per Session: 20 min  Stress: No Stress Concern Present  . Feeling of Stress : Not at all  Social Connections: Unknown  . Frequency of Communication with Friends and Family: More than three times a week  . Frequency of Social Gatherings with Friends and Family: More than three times a week  . Attends Religious Services: Not on file  . Active Member of Clubs or Organizations: Not on file  . Attends Archivist Meetings: Not on file  . Marital Status: Not on file  Intimate Partner Violence: Not At Risk  . Fear of Current or Ex-Partner: No  . Emotionally Abused: No  . Physically Abused: No  .  Sexually Abused: No    Review of Systems: See HPI, otherwise negative ROS  Physical Exam: BP (!) 155/68   Pulse (!) 58   Temp (!) 96.8 F (36 C) (Temporal)   Resp 18   Ht 5\' 1"  (1.549 m)   Wt 83 kg   SpO2 100%   BMI 34.58 kg/m  General:   Alert,  pleasant and cooperative in NAD Head:  Normocephalic and atraumatic. Neck:  Supple; no masses or thyromegaly. Lungs:  Clear throughout to auscultation, normal respiratory effort.    Heart:  +S1, +S2, Regular rate and rhythm, No edema.  Abdomen:  Soft, nontender and nondistended. Normal bowel sounds, without guarding, and without rebound.   Neurologic:  Alert and  oriented x4;  grossly normal neurologically.  Impression/Plan: Jennifer [name] is here for an endoscopy  to be performed for  evaluation of acid reflux    Risks, benefits, limitations, and alternatives regarding endoscopy have been reviewed with the patient.  Questions have been answered.  All parties agreeable.   Jonathon Bellows, MD  12/02/2019, 9:56 AM

## 2019-12-02 NOTE — Anesthesia Preprocedure Evaluation (Addendum)
Anesthesia Evaluation  Patient identified by MRN, date of birth, ID band Patient awake    Reviewed: Allergy & Precautions, H&P , NPO status , Patient's Chart, lab work & pertinent test results  History of Anesthesia Complications Negative for: history of anesthetic complications  Airway Mallampati: III  TM Distance: >3 FB Neck ROM: full    Dental  (+) Teeth Intact   Pulmonary sleep apnea , neg COPD, former smoker,    breath sounds clear to auscultation       Cardiovascular hypertension, (-) angina(-) Past MI and (-) Cardiac Stents + dysrhythmias (bradycardia) (-) pacemaker Rhythm:regular Rate:Normal     Neuro/Psych PSYCHIATRIC DISORDERS Anxiety negative neurological ROS     GI/Hepatic Neg liver ROS, GERD  ,Fatty liver   Endo/Other  Hypothyroidism   Renal/GU negative Renal ROS  negative genitourinary   Musculoskeletal   Abdominal   Peds  Hematology negative hematology ROS (+)   Anesthesia Other Findings Past Medical History: No date: Anxiety No date: Bariatric surgery status No date: Constipation No date: Dysrhythmia No date: Elevated liver enzymes No date: GERD (gastroesophageal reflux disease) No date: Hemorrhoids No date: Herpes genitalis No date: High cholesterol No date: Hyperlipidemia No date: Hypertension No date: Hypothyroidism No date: Osteoarthritis No date: Sleep apnea  Past Surgical History: No date: ABDOMINAL HYSTERECTOMY     Comment:  total for fibroids no h/o abnormal pap 2015: bariatric sleeve 1998: BREAST EXCISIONAL BIOPSY; Left No date: carpal tunnel repair 02/11/2016: COLONOSCOPY WITH PROPOFOL; N/A     Comment:  Procedure: COLONOSCOPY WITH PROPOFOL;  Surgeon: Jonathon Bellows, MD;  Location: [facility] ENDOSCOPY;  Service: Endoscopy;              Laterality: N/A; 10/01/2019: COLONOSCOPY WITH PROPOFOL; N/A     Comment:  Procedure: COLONOSCOPY WITH PROPOFOL;  Surgeon: Jonathon Bellows, MD;  Location: [facility] ENDOSCOPY;  Service:               Gastroenterology;  Laterality: N/A; No date: HEMORRHOID SURGERY  BMI    Body Mass Index: 34.58 kg/m      Reproductive/Obstetrics negative OB ROS                            Anesthesia Physical Anesthesia Plan  ASA: III  Anesthesia Plan: General   Post-op Pain Management:    Induction:   PONV Risk Score and Plan: Propofol infusion and TIVA  Airway Management Planned:   Additional Equipment:   Intra-op Plan:   Post-operative Plan:   Informed Consent: I have reviewed the patients History and Physical, chart, labs and discussed the procedure including the risks, benefits and alternatives for the proposed anesthesia with the patient or authorized representative who has indicated his/her understanding and acceptance.     Dental Advisory Given  Plan Discussed with: Anesthesiologist, CRNA and Surgeon  Anesthesia Plan Comments:         Anesthesia Quick Evaluation

## 2019-12-02 NOTE — Anesthesia Postprocedure Evaluation (Signed)
Anesthesia Post Note  Patient: Research scientist (life sciences)  Procedure(s) Performed: ESOPHAGOGASTRODUODENOSCOPY (EGD) WITH PROPOFOL (N/A )  Patient location during evaluation: PACU Anesthesia Type: General Level of consciousness: awake and alert Pain management: pain level controlled Vital Signs Assessment: post-procedure vital signs reviewed and stable Respiratory status: spontaneous breathing, nonlabored ventilation and respiratory function stable Cardiovascular status: blood pressure returned to baseline and stable Postop Assessment: no apparent nausea or vomiting Anesthetic complications: no   No complications documented.   Last Vitals:  Vitals:   12/02/19 1029 12/02/19 1039  BP: 113/67 (!) 144/68  Pulse: (!) 59 (!) 59  Resp: 15 13  Temp:    SpO2: 97% 97%    Last Pain:  Vitals:   12/02/19 1039  TempSrc:   PainSc: 0-No pain                 Brett Canales Dniya Neuhaus

## 2019-12-02 NOTE — Anesthesia Procedure Notes (Signed)
Date/Time: 12/02/2019 10:06 AM Performed by: Doreen Salvage, CRNA Pre-anesthesia Checklist: Patient identified, Emergency Drugs available, Suction available and Patient being monitored Patient Re-evaluated:Patient Re-evaluated prior to induction Oxygen Delivery Method: Nasal cannula Induction Type: IV induction Dental Injury: Teeth and Oropharynx as per pre-operative assessment  Comments: Nasal cannula with etCO2 monitoring

## 2019-12-02 NOTE — Op Note (Signed)
[facility] Gastroenterology Patient Name: Jennifer [name] Procedure Date: 12/02/2019 10:03 AM MRN: [id number] Account #: [id number] Date of Birth: [birth year] Admit Type: Outpatient Age: 69 Room: [facility] ENDO ROOM 4 Gender: Female Note Status: Finalized Procedure:             Upper GI endoscopy Indications:           Follow-up of esophageal reflux Providers:             Jonathon Bellows MD, MD Referring MD:          Yvetta Coder. Arnett (Referring MD) Medicines:             Monitored Anesthesia Care Complications:         No immediate complications. Procedure:             Pre-Anesthesia Assessment:                        - Prior to the procedure, a History and Physical was                         performed, and patient medications, allergies and                         sensitivities were reviewed. The patient's tolerance                         of previous anesthesia was reviewed.                        - The risks and benefits of the procedure and the                         sedation options and risks were discussed with the                         patient. All questions were answered and informed                         consent was obtained.                        - ASA Grade Assessment: II - A patient with mild                         systemic disease.                        After obtaining informed consent, the endoscope was                         passed under direct vision. Throughout the procedure,                         the patient's blood pressure, pulse, and oxygen                         saturations were monitored continuously. The Endoscope                         was introduced through the mouth,  and advanced to the                         third part of duodenum. The upper GI endoscopy was                         accomplished with ease. The patient tolerated the                         procedure well. Findings:      The esophagus was normal.      The  cardia and gastric fundus were normal on retroflexion.      The esophagus was normal.      The stomach was normal.      The examined duodenum was normal.      There was evidence of a widely patent previous surgical anastomosis in       the first portion of the duodenum. This was characterized by healthy       appearing mucosa. Impression:            - Normal esophagus.                        - Normal esophagus.                        - Normal stomach.                        - Normal examined duodenum.                        - Widely patent previous surgical anastomosis,                         characterized by healthy appearing mucosa was found in                         the duodenum.                        - No specimens collected. Recommendation:        - Discharge patient to home (with escort).                        - Resume previous diet.                        - Continue present medications.                        - Return to my office in 6 weeks.                        - Increase Prilosec to 40 mg BId if fails will need                         impedence and ph testing Procedure Code(s):     --- Professional ---                        [phone], Esophagogastroduodenoscopy, flexible,  transoral; diagnostic, including collection of                         specimen(s) by brushing or washing, when performed                         (separate procedure) Diagnosis Code(s):     --- Professional ---                        Z98.0, Intestinal bypass and anastomosis status                        K21.9, Gastro-esophageal reflux disease without                         esophagitis CPT copyright 2019 American Medical Association. All rights reserved. The codes documented in this report are preliminary and upon coder review may  be revised to meet current compliance requirements. Jonathon Bellows, MD Jonathon Bellows MD, MD 12/02/2019 10:19:08 AM This report has been signed  electronically. Number of Addenda: 0 Note Initiated On: 12/02/2019 10:03 AM Estimated Blood Loss:  Estimated blood loss: none.      [facility]

## 2019-12-02 NOTE — Transfer of Care (Signed)
Immediate Anesthesia Transfer of Care Note  Patient: Jennifer [name]  Procedure(s) Performed: Procedure(s): ESOPHAGOGASTRODUODENOSCOPY (EGD) WITH PROPOFOL (N/A)  Patient Location: PACU and Endoscopy Unit  Anesthesia Type:General  Level of Consciousness: sedated  Airway & Oxygen Therapy: Patient Spontanous Breathing and Patient connected to nasal cannula oxygen  Post-op Assessment: Report given to RN and Post -op Vital signs reviewed and stable  Post vital signs: Reviewed and stable  Last Vitals:  Vitals:   12/02/19 0929 12/02/19 1019  BP: (!) 155/68 (!) 113/45  Pulse: (!) 58 73  Resp: 18 20  Temp: (!) 36 C   SpO2: 642% 90%    Complications: No apparent anesthesia complications

## 2019-12-06 DIAGNOSIS — M7582 Other shoulder lesions, left shoulder: Secondary | ICD-10-CM | POA: Diagnosis not present

## 2019-12-06 DIAGNOSIS — M25512 Pain in left shoulder: Secondary | ICD-10-CM | POA: Diagnosis not present

## 2019-12-06 DIAGNOSIS — M25511 Pain in right shoulder: Secondary | ICD-10-CM | POA: Diagnosis not present

## 2019-12-06 DIAGNOSIS — M7581 Other shoulder lesions, right shoulder: Secondary | ICD-10-CM | POA: Diagnosis not present

## 2019-12-23 ENCOUNTER — Ambulatory Visit: Payer: Medicare Other | Admitting: Gastroenterology

## 2020-01-02 ENCOUNTER — Other Ambulatory Visit: Payer: Self-pay | Admitting: Family

## 2020-01-03 ENCOUNTER — Other Ambulatory Visit: Payer: Self-pay | Admitting: Family

## 2020-01-03 ENCOUNTER — Other Ambulatory Visit: Payer: Self-pay | Admitting: Gastroenterology

## 2020-01-03 DIAGNOSIS — M79671 Pain in right foot: Secondary | ICD-10-CM

## 2020-01-03 DIAGNOSIS — K649 Unspecified hemorrhoids: Secondary | ICD-10-CM

## 2020-01-17 ENCOUNTER — Other Ambulatory Visit: Payer: Self-pay | Admitting: Family

## 2020-01-17 DIAGNOSIS — I10 Essential (primary) hypertension: Secondary | ICD-10-CM

## 2020-01-31 ENCOUNTER — Other Ambulatory Visit: Payer: Self-pay | Admitting: Family

## 2020-01-31 DIAGNOSIS — Z1231 Encounter for screening mammogram for malignant neoplasm of breast: Secondary | ICD-10-CM

## 2020-02-04 ENCOUNTER — Other Ambulatory Visit: Payer: Self-pay | Admitting: Family

## 2020-02-04 DIAGNOSIS — R6889 Other general symptoms and signs: Secondary | ICD-10-CM

## 2020-02-04 DIAGNOSIS — J3489 Other specified disorders of nose and nasal sinuses: Secondary | ICD-10-CM

## 2020-02-10 DIAGNOSIS — M25561 Pain in right knee: Secondary | ICD-10-CM | POA: Diagnosis not present

## 2020-02-10 DIAGNOSIS — S86911A Strain of unspecified muscle(s) and tendon(s) at lower leg level, right leg, initial encounter: Secondary | ICD-10-CM | POA: Diagnosis not present

## 2020-02-23 ENCOUNTER — Other Ambulatory Visit: Payer: Self-pay | Admitting: Family

## 2020-02-25 ENCOUNTER — Encounter: Payer: Self-pay | Admitting: Family

## 2020-02-25 ENCOUNTER — Other Ambulatory Visit: Payer: Self-pay

## 2020-02-25 ENCOUNTER — Ambulatory Visit (INDEPENDENT_AMBULATORY_CARE_PROVIDER_SITE_OTHER): Payer: Medicare Other | Admitting: Family

## 2020-02-25 VITALS — BP 140/70 | HR 77 | Temp 98.5°F | Ht 60.98 in | Wt 182.8 lb

## 2020-02-25 DIAGNOSIS — I499 Cardiac arrhythmia, unspecified: Secondary | ICD-10-CM | POA: Insufficient documentation

## 2020-02-25 DIAGNOSIS — M79671 Pain in right foot: Secondary | ICD-10-CM

## 2020-02-25 DIAGNOSIS — K219 Gastro-esophageal reflux disease without esophagitis: Secondary | ICD-10-CM | POA: Diagnosis not present

## 2020-02-25 DIAGNOSIS — R7303 Prediabetes: Secondary | ICD-10-CM | POA: Insufficient documentation

## 2020-02-25 DIAGNOSIS — G629 Polyneuropathy, unspecified: Secondary | ICD-10-CM

## 2020-02-25 DIAGNOSIS — E119 Type 2 diabetes mellitus without complications: Secondary | ICD-10-CM | POA: Insufficient documentation

## 2020-02-25 DIAGNOSIS — F32A Depression, unspecified: Secondary | ICD-10-CM

## 2020-02-25 DIAGNOSIS — F419 Anxiety disorder, unspecified: Secondary | ICD-10-CM | POA: Diagnosis not present

## 2020-02-25 DIAGNOSIS — I10 Essential (primary) hypertension: Secondary | ICD-10-CM | POA: Diagnosis not present

## 2020-02-25 MED ORDER — GABAPENTIN 300 MG PO CAPS: 300.0000 mg | ORAL_CAPSULE | Freq: Every day | ORAL | 3 refills | Status: DC

## 2020-02-25 MED ORDER — OMEPRAZOLE 20 MG PO CPDR: 20.0000 mg | DELAYED_RELEASE_CAPSULE | Freq: Two times a day (BID) | ORAL | 1 refills | Status: DC

## 2020-02-25 MED ORDER — GABAPENTIN 100 MG PO CAPS: ORAL_CAPSULE | ORAL | 2 refills | Status: DC

## 2020-02-25 MED ORDER — SERTRALINE HCL 50 MG PO TABS: 50.0000 mg | ORAL_TABLET | Freq: Every day | ORAL | 3 refills | Status: DC

## 2020-02-25 NOTE — Progress Notes (Signed)
Subjective:    Patient ID: Jennifer [name], female    DOB: Apr 29, 1950, 70 y.o.   MRN: [id number]  CC: Jennifer [name] is a 70 y.o. female who presents today for follow up.   HPI: Complains of increased anxiety, trouble sleeping x one month. She feels overwhelmed.  No h/o anxiety.  Worries about husband and sister in regards to health. Trouble falling and staying asleep. No anxiety attacks.  No si/hi.  Compliant with trazodone 100mg  without relief.   HTN- compliant HCTZ 12.5mg , losartan 100mg . No cp, palpitations. Follows with Dr Clayborn Bigness  Bilateral feet pain- Pain on bottom of feet , 'feels like walking on rocks' throughout the day.  compliant with gabapentin 100mg  qam, 100 mg midday and 200mg  qhs.   Compliant with lymphapress with compression stockings. No leg swelling   GERD-she is having breakthrough epigastric burning. Compliant with omeprazole 20mg . EGD 11/2019, [name].          HISTORY:  Past Medical History:  Diagnosis Date  . Anxiety   . Bariatric surgery status   . Constipation   . Dysrhythmia   . Elevated liver enzymes   . GERD (gastroesophageal reflux disease)   . Hemorrhoids   . Herpes genitalis   . High cholesterol   . Hyperlipidemia   . Hypertension   . Hypothyroidism   . Osteoarthritis   . Sleep apnea    Past Surgical History:  Procedure Laterality Date  . ABDOMINAL HYSTERECTOMY     total for fibroids no h/o abnormal pap  . bariatric sleeve  2015  . BREAST EXCISIONAL BIOPSY Left 1998  . carpal tunnel repair    . COLONOSCOPY WITH PROPOFOL N/A 02/11/2016   Procedure: COLONOSCOPY WITH PROPOFOL;  Surgeon: Jonathon Bellows, MD;  Location: [facility] ENDOSCOPY;  Service: Endoscopy;  Laterality: N/A;  . COLONOSCOPY WITH PROPOFOL N/A 10/01/2019   Procedure: COLONOSCOPY WITH PROPOFOL;  Surgeon: Jonathon Bellows, MD;  Location: [facility] ENDOSCOPY;  Service: Gastroenterology;  Laterality: N/A;  . ESOPHAGOGASTRODUODENOSCOPY (EGD) WITH PROPOFOL N/A 12/02/2019   Procedure:  ESOPHAGOGASTRODUODENOSCOPY (EGD) WITH PROPOFOL;  Surgeon: Jonathon Bellows, MD;  Location: [facility] ENDOSCOPY;  Service: Gastroenterology;  Laterality: N/A;  . HEMORRHOID SURGERY     Family History  Problem Relation Age of Onset  . Breast cancer Sister 78       materal 1/2 sister  . Hypertension Sister   . Hypertension Mother   . Heart disease Father   . Hypertension Brother     Allergies: Celecoxib, Pollen extract, Nasacort [triamcinolone], and Vicodin [hydrocodone-acetaminophen] Current Outpatient Medications on File Prior to Visit  Medication Sig Dispense Refill  . acetaminophen (TYLENOL) 500 MG tablet Take by mouth.    [name] Kitchen acyclovir (ZOVIRAX) 400 MG tablet TAKE 1 TABLET BY MOUTH EVERY DAY 30 tablet 5  . Ascorbic Acid (VITAMIN C) 1000 MG tablet Take 1,000 mg by mouth daily.    [name] Kitchen aspirin 81 MG tablet Take 81 mg by mouth daily.    [name] Kitchen azelastine (ASTELIN) 0.1 % nasal spray USE 1 SPRAY EACH NOSTRIL TWICE A DAY AS NEEDED FOR ALLERGIES    . fexofenadine (ALLEGRA) 180 MG tablet TAKE 1 TABLET BY MOUTH EVERY DAY 30 tablet 3  . fluticasone (FLONASE) 50 MCG/ACT nasal spray PLACE 1 SPRAY INTO BOTH NOSTRILS DAILY. 16 mL 2  . hydrochlorothiazide (MICROZIDE) 12.5 MG capsule TAKE 1 CAPSULE BY MOUTH EVERY DAY 30 capsule 2  . hydrocortisone (ANUSOL-HC) 25 MG suppository Place 1 suppository (25 mg total) rectally 2 (two) times daily as needed  for hemorrhoids or anal itching. 12 suppository 2  . levothyroxine (SYNTHROID) 50 MCG tablet TAKE 1 TABLET BY MOUTH EVERY DAY 30 tablet 5  . losartan (COZAAR) 100 MG tablet Take 1 tablet (100 mg total) by mouth at bedtime. 90 tablet 2  . Multiple Vitamins-Minerals (BARIATRIC MULTIVITAMINS/IRON PO) Take by mouth.    . Propylene Glycol (SYSTANE BALANCE) 0.6 % SOLN Apply to eye.    . traZODone (DESYREL) 100 MG tablet TAKE 1 TABLET BY MOUTH EVERYDAY AT BEDTIME 90 tablet 2   No current facility-administered medications on file prior to visit.    Social History   Tobacco Use   . Smoking status: Former Smoker    Packs/day: 1.50    Years: 24.00    Pack years: 36.00    Types: Cigarettes    Quit date: 1993    Years since quitting: 29.1  . Smokeless tobacco: Never Used  . Tobacco comment: quit 1995.   Vaping Use  . Vaping Use: Never used  Substance Use Topics  . Alcohol use: Not Currently  . Drug use: No    Review of Systems  Constitutional: Negative for chills and fever.  Respiratory: Negative for cough.   Cardiovascular: Negative for chest pain and palpitations.  Gastrointestinal: Negative for abdominal pain, nausea and vomiting.  Neurological: Negative for numbness (bilateral feet).  Psychiatric/Behavioral: Positive for sleep disturbance. Negative for suicidal ideas. The patient is nervous/anxious.       Objective:    BP 140/70   Pulse 77   Temp 98.5 F (36.9 C)   Ht 5' 0.98" (1.549 m)   Wt 182 lb 12.8 oz (82.9 kg)   SpO2 97%   BMI 34.56 kg/m  BP Readings from Last 3 Encounters:  02/25/20 140/70  12/02/19 (!) 144/68  11/21/19 (!) 149/63   Wt Readings from Last 3 Encounters:  02/25/20 182 lb 12.8 oz (82.9 kg)  12/02/19 183 lb (83 kg)  11/21/19 186 lb 6.4 oz (84.6 kg)    Physical Exam Vitals reviewed.  Constitutional:      Appearance: She is well-developed and well-nourished.  Eyes:     Conjunctiva/sclera: Conjunctivae normal.  Cardiovascular:     Rate and Rhythm: Normal rate and regular rhythm.     Pulses: Normal pulses.     Heart sounds: Normal heart sounds.  Pulmonary:     Effort: Pulmonary effort is normal.     Breath sounds: Normal breath sounds. No wheezing, rhonchi or rales.  Skin:    General: Skin is warm and dry.  Neurological:     Mental Status: She is alert.  Psychiatric:        Mood and Affect: Mood and affect normal.        Speech: Speech normal.        Behavior: Behavior normal.        Thought Content: Thought content normal.        Assessment & Plan:   Problem List Items Addressed This Visit       Cardiovascular and Mediastinum   Essential hypertension - Primary    Improved as rested in room. Considering increasing hctz to 25mg  at follow up in 6 weeks if remains elevated. Goal closer to 130. Continue hctz 12.5mg  and losartan 100mg .       Relevant Orders   Lipid panel   Microalbumin / creatinine urine ratio   Hemoglobin A1c   Comprehensive metabolic panel   TSH     Digestive   GERD (gastroesophageal reflux  disease)    Uncontrolled. Advised to increase omeprazole to 20mg  BID after reviewing recent EGD, Dr Georgeann Oppenheim notes. She may further increase to 40mg  bid if needed.       Relevant Medications   omeprazole (PRILOSEC) 20 MG capsule     Endocrine   Diabetes mellitus without complication ([place])   Relevant Orders   Microalbumin / creatinine urine ratio   Hemoglobin A1c     Nervous and Auditory   Peripheral neuropathy    Uncontrolled. Increase gabapentin to 200mg  qam , 200mg  midday and 300mg  qhs.       Relevant Medications   sertraline (ZOLOFT) 50 MG tablet   gabapentin (NEURONTIN) 100 MG capsule   gabapentin (NEURONTIN) 300 MG capsule     Other   Anxiety and depression    Uncontrolled. Start zoloft 50mg . Continue trazodone 100mg  for now however we may discontinue at followup.       Relevant Medications   sertraline (ZOLOFT) 50 MG tablet   Irregular heartbeat    Asymptomatic. EKG shows SR. QTC 448. No acute ischemia. PAC noted and T wave flattening which were not noted on prior EKG 02/2018. Faxed to Dr Clayborn Bigness and we spoke on the phone , in the absence of symptoms, he did not feel acute and that she could follow up with him as planned.       Relevant Orders   EKG 12-Lead (Completed)    Other Visit Diagnoses    Right foot pain       Relevant Medications   gabapentin (NEURONTIN) 100 MG capsule   gabapentin (NEURONTIN) 300 MG capsule       I have discontinued Recia Cosgriff's EPINEPHrine. I have also changed her omeprazole and gabapentin. Additionally, I am  having her start on sertraline and gabapentin. Lastly, I am having her maintain her aspirin, hydrocortisone, azelastine, acetaminophen, vitamin C, fluticasone, Multiple Vitamins-Minerals (BARIATRIC MULTIVITAMINS/IRON PO), Systane Balance, losartan, levothyroxine, acyclovir, hydrochlorothiazide, fexofenadine, and traZODone.   Meds ordered this encounter  Medications  . omeprazole (PRILOSEC) 20 MG capsule    Sig: Take 1 capsule (20 mg total) by mouth 2 (two) times daily before a meal.    Dispense:  120 capsule    Refill:  1    Order Specific Question:   Supervising Provider    Answer:   Deborra Medina L [[id number]]  . sertraline (ZOLOFT) 50 MG tablet    Sig: Take 1 tablet (50 mg total) by mouth at bedtime.    Dispense:  90 tablet    Refill:  3    Order Specific Question:   Supervising Provider    Answer:   Deborra Medina L [[id number]]  . gabapentin (NEURONTIN) 100 MG capsule    Sig: Take two tablet in the morning and two tablets midday PO.    Dispense:  120 capsule    Refill:  2    Order Specific Question:   Supervising Provider    Answer:   Deborra Medina L [[id number]]  . gabapentin (NEURONTIN) 300 MG capsule    Sig: Take 1 capsule (300 mg total) by mouth at bedtime.    Dispense:  90 capsule    Refill:  3    Order Specific Question:   Supervising Provider    Answer:   Crecencio Mc [[id number]]    Return precautions given.   Risks, benefits, and alternatives of the medications and treatment plan prescribed today were discussed, and patient expressed understanding.   Education regarding symptom management and diagnosis given  to patient on AVS.  Continue to follow with [name], FNP for routine health maintenance.   Jennifer [name] and I agreed with plan.   [name], FNP

## 2020-02-25 NOTE — Assessment & Plan Note (Signed)
Improved as rested in room. Considering increasing hctz to 25mg  at follow up in 6 weeks if remains elevated. Goal closer to 130. Continue hctz 12.5mg  and losartan 100mg .

## 2020-02-25 NOTE — Assessment & Plan Note (Signed)
Uncontrolled. Advised to increase omeprazole to 20mg  BID after reviewing recent EGD, Dr Georgeann Oppenheim notes. She may further increase to 40mg  bid if needed.

## 2020-02-25 NOTE — Assessment & Plan Note (Signed)
Uncontrolled. Start zoloft 50mg . Continue trazodone 100mg  for now however we may discontinue at followup.

## 2020-02-25 NOTE — Assessment & Plan Note (Addendum)
Asymptomatic. EKG shows SR. QTC 448. No acute ischemia. PAC noted and T wave flattening which were not noted on prior EKG 02/2018. Faxed to Dr Clayborn Bigness and we spoke on the phone , in the absence of symptoms, he did not feel acute and that she could follow up with him as planned.

## 2020-02-25 NOTE — Patient Instructions (Addendum)
Start zoloft  Increase omeprazole to 20mg  twice per day an HOUR before largest meals  Please also ensure you are taking synthroid correctly as this can affect thyroid levels-  Take Synthroid once a day, every day at the same time before breakfast. Take Synthroid with only water and on an empty stomach. Wait 30 minutes to 1 hour before eating or drinking anything other than water.Please also separated by >4 hours from acid reflux medications, calcium, iron, multivitamins as may interfere with absorption.   KEEP phone on you and charged until I hear from Dr Clayborn Bigness today.

## 2020-02-25 NOTE — Assessment & Plan Note (Signed)
Uncontrolled. Increase gabapentin to 200mg  qam , 200mg  midday and 300mg  qhs.

## 2020-03-01 ENCOUNTER — Other Ambulatory Visit: Payer: Self-pay | Admitting: Family

## 2020-03-01 DIAGNOSIS — J3489 Other specified disorders of nose and nasal sinuses: Secondary | ICD-10-CM

## 2020-03-02 ENCOUNTER — Ambulatory Visit
Admission: RE | Admit: 2020-03-02 | Discharge: 2020-03-02 | Disposition: A | Discharge status: Home / Self Care | Payer: Medicare Other | Source: Ambulatory Visit | Attending: Family | Admitting: Family

## 2020-03-02 ENCOUNTER — Other Ambulatory Visit: Payer: Self-pay

## 2020-03-02 DIAGNOSIS — Z1231 Encounter for screening mammogram for malignant neoplasm of breast: Secondary | ICD-10-CM | POA: Insufficient documentation

## 2020-03-04 ENCOUNTER — Telehealth: Payer: Self-pay | Admitting: Pulmonary Disease

## 2020-03-04 DIAGNOSIS — M5489 Other dorsalgia: Secondary | ICD-10-CM | POA: Diagnosis not present

## 2020-03-04 DIAGNOSIS — J9811 Atelectasis: Secondary | ICD-10-CM | POA: Diagnosis not present

## 2020-03-04 DIAGNOSIS — R091 Pleurisy: Secondary | ICD-10-CM | POA: Diagnosis not present

## 2020-03-04 NOTE — Telephone Encounter (Signed)
Patient is aware of recommendations. She voiced her understanding and had no further questions.  Nothing further needed.

## 2020-03-04 NOTE — Telephone Encounter (Signed)
I recommend that she be seen either urgent care or ED.  She will likely need imaging.

## 2020-03-04 NOTE — Telephone Encounter (Signed)
Dr. Mortimer Fries patient- last seen 02/19/2019 for OSA. No pending appt.   Spoke to patient, who reports of left upper back discomfort with deep breathing. Sx developed last night.  Denied f/c/s, sob, cough, wheezing or additional symptoms.  She has a hx of pleurisy.  Dr. Patsey Berthold please advise. Dr. Mortimer Fries and Aaron Edelman are both unavailable.

## 2020-03-08 ENCOUNTER — Other Ambulatory Visit: Payer: Self-pay

## 2020-03-08 ENCOUNTER — Encounter: Payer: Self-pay | Admitting: Intensive Care

## 2020-03-08 ENCOUNTER — Emergency Department
Admission: EM | Admit: 2020-03-08 | Discharge: 2020-03-08 | Disposition: A | Discharge status: Home / Self Care | Payer: Medicare Other | Attending: Student in an Organized Health Care Education/Training Program | Admitting: Student in an Organized Health Care Education/Training Program

## 2020-03-08 DIAGNOSIS — Z5321 Procedure and treatment not carried out due to patient leaving prior to being seen by health care provider: Secondary | ICD-10-CM | POA: Diagnosis not present

## 2020-03-08 DIAGNOSIS — I161 Hypertensive emergency: Secondary | ICD-10-CM | POA: Diagnosis not present

## 2020-03-08 DIAGNOSIS — M79602 Pain in left arm: Secondary | ICD-10-CM | POA: Insufficient documentation

## 2020-03-08 DIAGNOSIS — Z8679 Personal history of other diseases of the circulatory system: Secondary | ICD-10-CM | POA: Diagnosis not present

## 2020-03-08 DIAGNOSIS — M542 Cervicalgia: Secondary | ICD-10-CM | POA: Insufficient documentation

## 2020-03-08 DIAGNOSIS — R519 Headache, unspecified: Secondary | ICD-10-CM | POA: Insufficient documentation

## 2020-03-08 DIAGNOSIS — I10 Essential (primary) hypertension: Secondary | ICD-10-CM | POA: Diagnosis not present

## 2020-03-08 HISTORY — DX: Polyneuropathy, unspecified: G62.9

## 2020-03-08 LAB — CBC
HCT: 37.8 % (ref 36.0–46.0)
Hemoglobin: 12.3 g/dL (ref 12.0–15.0)
MCH: 29.4 pg (ref 26.0–34.0)
MCHC: 32.5 g/dL (ref 30.0–36.0)
MCV: 90.4 fL (ref 80.0–100.0)
Platelets: 168 10*3/uL (ref 150–400)
RBC: 4.18 MIL/uL (ref 3.87–5.11)
RDW: 13.8 % (ref 11.5–15.5)
WBC: 6.3 10*3/uL (ref 4.0–10.5)
nRBC: 0 % (ref 0.0–0.2)

## 2020-03-08 LAB — BASIC METABOLIC PANEL
Anion gap: 8 (ref 5–15)
BUN: 12 mg/dL (ref 8–23)
CO2: 28 mmol/L (ref 22–32)
Calcium: 9.4 mg/dL (ref 8.9–10.3)
Chloride: 103 mmol/L (ref 98–111)
Creatinine, Ser: 0.72 mg/dL (ref 0.44–1.00)
GFR, Estimated: >60 mL/min (ref >60)
Glucose, Bld: 108 mg/dL — ABNORMAL HIGH (ref 70–99)
Potassium: 4.2 mmol/L (ref 3.5–5.1)
Sodium: 139 mmol/L (ref 135–145)

## 2020-03-08 LAB — TROPONIN I (HIGH SENSITIVITY): Troponin I (High Sensitivity): 6 ng/L (ref <18)

## 2020-03-08 NOTE — ED Notes (Signed)
Pt left prior to being seen by MD. Notified registration staff prior to leaving.

## 2020-03-08 NOTE — ED Triage Notes (Signed)
Patient c/o headache, left arm pain, and left neck pain for a few days that has not subsided. Increased blood pressure the last 2-3 days.

## 2020-03-10 ENCOUNTER — Ambulatory Visit (INDEPENDENT_AMBULATORY_CARE_PROVIDER_SITE_OTHER): Payer: Medicare Other | Admitting: Family

## 2020-03-10 ENCOUNTER — Encounter: Payer: Self-pay | Admitting: Emergency Medicine

## 2020-03-10 ENCOUNTER — Ambulatory Visit: Payer: Medicare Other | Admitting: Family

## 2020-03-10 ENCOUNTER — Other Ambulatory Visit: Payer: Self-pay

## 2020-03-10 ENCOUNTER — Emergency Department
Admission: EM | Admit: 2020-03-10 | Discharge: 2020-03-10 | Disposition: A | Discharge status: Home / Self Care | Payer: Medicare Other | Attending: Emergency Medicine | Admitting: Emergency Medicine

## 2020-03-10 ENCOUNTER — Emergency Department: Payer: Medicare Other

## 2020-03-10 ENCOUNTER — Telehealth: Payer: Self-pay

## 2020-03-10 ENCOUNTER — Other Ambulatory Visit (INDEPENDENT_AMBULATORY_CARE_PROVIDER_SITE_OTHER): Payer: Medicare Other

## 2020-03-10 VITALS — BP 190/80 | HR 55 | Resp 16

## 2020-03-10 DIAGNOSIS — Z950 Presence of cardiac pacemaker: Secondary | ICD-10-CM | POA: Diagnosis not present

## 2020-03-10 DIAGNOSIS — E119 Type 2 diabetes mellitus without complications: Secondary | ICD-10-CM | POA: Diagnosis not present

## 2020-03-10 DIAGNOSIS — Z79899 Other long term (current) drug therapy: Secondary | ICD-10-CM | POA: Insufficient documentation

## 2020-03-10 DIAGNOSIS — I10 Essential (primary) hypertension: Secondary | ICD-10-CM

## 2020-03-10 DIAGNOSIS — E1169 Type 2 diabetes mellitus with other specified complication: Secondary | ICD-10-CM | POA: Insufficient documentation

## 2020-03-10 DIAGNOSIS — Z87891 Personal history of nicotine dependence: Secondary | ICD-10-CM | POA: Diagnosis not present

## 2020-03-10 DIAGNOSIS — R030 Elevated blood-pressure reading, without diagnosis of hypertension: Secondary | ICD-10-CM | POA: Diagnosis present

## 2020-03-10 DIAGNOSIS — R519 Headache, unspecified: Secondary | ICD-10-CM | POA: Diagnosis not present

## 2020-03-10 DIAGNOSIS — Z7982 Long term (current) use of aspirin: Secondary | ICD-10-CM | POA: Insufficient documentation

## 2020-03-10 DIAGNOSIS — E7800 Pure hypercholesterolemia, unspecified: Secondary | ICD-10-CM | POA: Diagnosis not present

## 2020-03-10 DIAGNOSIS — E1142 Type 2 diabetes mellitus with diabetic polyneuropathy: Secondary | ICD-10-CM | POA: Insufficient documentation

## 2020-03-10 DIAGNOSIS — E039 Hypothyroidism, unspecified: Secondary | ICD-10-CM | POA: Diagnosis not present

## 2020-03-10 LAB — COMPREHENSIVE METABOLIC PANEL
ALT: 30 U/L (ref 0–35)
ALT: 34 U/L (ref 0–44)
AST: 46 U/L — ABNORMAL HIGH (ref 0–37)
AST: 50 U/L — ABNORMAL HIGH (ref 15–41)
Albumin: 4.3 g/dL (ref 3.5–5.2)
Albumin: 4.5 g/dL (ref 3.5–5.0)
Alkaline Phosphatase: 65 U/L (ref 39–117)
Alkaline Phosphatase: 71 U/L (ref 38–126)
Anion gap: 9 (ref 5–15)
BUN: 12 mg/dL (ref 6–23)
BUN: 12 mg/dL (ref 8–23)
CO2: 26 mEq/L (ref 19–32)
CO2: 26 mmol/L (ref 22–32)
Calcium: 9.4 mg/dL (ref 8.4–10.5)
Calcium: 9.4 mg/dL (ref 8.9–10.3)
Chloride: 103 mEq/L (ref 96–112)
Chloride: 103 mmol/L (ref 98–111)
Creatinine, Ser: 0.78 mg/dL (ref 0.40–1.20)
Creatinine, Ser: 0.69 mg/dL (ref 0.44–1.00)
GFR, Estimated: >60 mL/min (ref >60)
GFR: 76.87 mL/min (ref >60.00)
Glucose, Bld: 94 mg/dL (ref 70–99)
Glucose, Bld: 96 mg/dL (ref 70–99)
Potassium: 4.2 mEq/L (ref 3.5–5.1)
Potassium: 3.5 mmol/L (ref 3.5–5.1)
Sodium: 139 mEq/L (ref 135–145)
Sodium: 138 mmol/L (ref 135–145)
Total Bilirubin: 0.6 mg/dL (ref 0.2–1.2)
Total Bilirubin: 0.9 mg/dL (ref 0.3–1.2)
Total Protein: 6.8 g/dL (ref 6.0–8.3)
Total Protein: 7.3 g/dL (ref 6.5–8.1)

## 2020-03-10 LAB — CBC WITH DIFFERENTIAL/PLATELET
Abs Immature Granulocytes: 0.01 10*3/uL (ref 0.00–0.07)
Basophils Absolute: 0 10*3/uL (ref 0.0–0.1)
Basophils Relative: 1 %
Eosinophils Absolute: 0 10*3/uL (ref 0.0–0.5)
Eosinophils Relative: 0 %
HCT: 38.9 % (ref 36.0–46.0)
Hemoglobin: 12.4 g/dL (ref 12.0–15.0)
Immature Granulocytes: 0 %
Lymphocytes Relative: 30 %
Lymphs Abs: 2.1 10*3/uL (ref 0.7–4.0)
MCH: 28.8 pg (ref 26.0–34.0)
MCHC: 31.9 g/dL (ref 30.0–36.0)
MCV: 90.5 fL (ref 80.0–100.0)
Monocytes Absolute: 0.6 10*3/uL (ref 0.1–1.0)
Monocytes Relative: 9 %
Neutro Abs: 4.1 10*3/uL (ref 1.7–7.7)
Neutrophils Relative %: 60 %
Platelets: 179 10*3/uL (ref 150–400)
RBC: 4.3 MIL/uL (ref 3.87–5.11)
RDW: 14.1 % (ref 11.5–15.5)
WBC: 6.8 10*3/uL (ref 4.0–10.5)
nRBC: 0 % (ref 0.0–0.2)

## 2020-03-10 LAB — LIPID PANEL
Cholesterol: 174 mg/dL (ref 0–200)
HDL: 52.9 mg/dL (ref >39.00)
LDL Cholesterol: 98 mg/dL (ref 0–99)
NonHDL: 120.66
Total CHOL/HDL Ratio: 3
Triglycerides: 114 mg/dL (ref 0.0–149.0)
VLDL: 22.8 mg/dL (ref 0.0–40.0)

## 2020-03-10 LAB — URINALYSIS, COMPLETE (UACMP) WITH MICROSCOPIC
Bacteria, UA: NONE SEEN
Bilirubin Urine: NEGATIVE
Glucose, UA: NEGATIVE mg/dL
Hgb urine dipstick: NEGATIVE
Ketones, ur: NEGATIVE mg/dL
Leukocytes,Ua: NEGATIVE
Nitrite: NEGATIVE
Protein, ur: NEGATIVE mg/dL
Specific Gravity, Urine: 1.002 — ABNORMAL LOW (ref 1.005–1.030)
Squamous Epithelial / HPF: NONE SEEN (ref 0–5)
WBC, UA: NONE SEEN WBC/hpf (ref 0–5)
pH: 6 (ref 5.0–8.0)

## 2020-03-10 LAB — TSH: TSH: 1.99 u[IU]/mL (ref 0.35–4.50)

## 2020-03-10 LAB — MICROALBUMIN / CREATININE URINE RATIO
Creatinine,U: 14 mg/dL
Microalb Creat Ratio: 5 mg/g (ref 0.0–30.0)
Microalb, Ur: <0.7 mg/dL (ref 0.0–1.9)

## 2020-03-10 LAB — HEMOGLOBIN A1C: Hgb A1c MFr Bld: 5.4 % (ref 4.6–6.5)

## 2020-03-10 LAB — TROPONIN I (HIGH SENSITIVITY): Troponin I (High Sensitivity): 4 ng/L (ref <18)

## 2020-03-10 MED ORDER — ACETAMINOPHEN 500 MG PO TABS
1000.0000 mg | ORAL_TABLET | Freq: Once | ORAL | Status: AC
  Filled 2020-03-10: qty 2

## 2020-03-10 MED ORDER — METOCLOPRAMIDE HCL 5 MG/ML IJ SOLN
10.0000 mg | Freq: Once | INTRAMUSCULAR | Status: AC
  Administered 2020-03-10: 10 mg via INTRAVENOUS
  Filled 2020-03-10: qty 2

## 2020-03-10 MED ORDER — ACETAMINOPHEN 500 MG PO TABS
ORAL_TABLET | ORAL | Status: AC
  Administered 2020-03-10: 1000 mg via ORAL
  Filled 2020-03-10: qty 2

## 2020-03-10 MED ORDER — DIPHENHYDRAMINE HCL 50 MG/ML IJ SOLN
12.5000 mg | Freq: Once | INTRAMUSCULAR | Status: AC
  Administered 2020-03-10: 12.5 mg via INTRAVENOUS
  Filled 2020-03-10: qty 1

## 2020-03-10 NOTE — Discharge Instructions (Addendum)
Take your blood pressure once in the morning and once at night.  If the top number is above 140 in the morning you can take 2 of your hydrochlorothiazide pills and record that you did so. If your blood pressure less then If your blood pressure is below 140 take your normal regime. Follow-up with blood pressure recheck on Thursday.  Return to the ER if you develop worsening headaches, chest pain or any other concerns

## 2020-03-10 NOTE — Telephone Encounter (Signed)
Patient has been evaluated in office.

## 2020-03-10 NOTE — ED Notes (Signed)
Pt alert and oriented X 4, stable for discharge. RR even and unlabored, color WNL. Discussed discharge instructions and follow-up as directed. Discharge medications discussed if prescribed. Pt had opportunity to ask questions, and RN to provide patient/family eduction.  

## 2020-03-10 NOTE — Progress Notes (Signed)
Subjective:    Patient ID: Jennifer [name], female    DOB: 16-Nov-1950, 70 y.o.   MRN: [id number]  CC: Jennifer [name] is a 70 y.o. female who presents today for an acute visit.    HPI: Patients walks into today and reports blood pressure 210/90 at home today. Blood pressure has been running high for week and half. She 'feels funny' and shares constellation of symptoms below.   She was seen at urgent care 03/04/20 for upper back pain and left shoulder blade pain , which had been occurring for months. When takes a deep breath, she describes right rib cage pain and right low back, leg pain.  She was started on mobic 15mg  daily , stopped one week ago.  Earlier today she had 'irritated' feeling from left shoulder to left hand.Notices that left arm will hurt when 'blood pressure is elevated' .    No pain currently.  No injury.   She has with Dr Roland Rack for left shoulder pain in the past.   'Slight' HA earlier today , since resolved. No CP, vision change,numbness in left arm, dizziness.  She also noted 3 days ago noted 'tingling of tip of tongue' , this has since resolved.   Per patient, she called 3 days ago top on call cardiology ( Sunday) and was advised to take  Take total of 250mg  losartan and then yesterday she took 200mg  losartan. Blood pressure decreased to 147/87 and then 157/90 yesterday.  Today she hasnt taken losartan, hctz today as wasn't sure if she could with labs.   Compliant with aspirin 81mg   Went to ED and left without being seen 2 days ago. troponin 6 , 2 days ago.   She has an appointment with Dr Clayborn Bigness in 2 days.      HISTORY:  Past Medical History:  Diagnosis Date  . Anxiety   . Bariatric surgery status   . Constipation   . Dysrhythmia   . Elevated liver enzymes   . GERD (gastroesophageal reflux disease)   . Hemorrhoids   . Herpes genitalis   . High cholesterol   . Hyperlipidemia   . Hypertension   . Hypothyroidism   . Neuropathy   . Osteoarthritis    . Sleep apnea    Past Surgical History:  Procedure Laterality Date  . ABDOMINAL HYSTERECTOMY     total for fibroids no h/o abnormal pap  . bariatric sleeve  2015  . BREAST EXCISIONAL BIOPSY Left 1998  . carpal tunnel repair    . COLONOSCOPY WITH PROPOFOL N/A 02/11/2016   Procedure: COLONOSCOPY WITH PROPOFOL;  Surgeon: Jonathon Bellows, MD;  Location: [facility] ENDOSCOPY;  Service: Endoscopy;  Laterality: N/A;  . COLONOSCOPY WITH PROPOFOL N/A 10/01/2019   Procedure: COLONOSCOPY WITH PROPOFOL;  Surgeon: Jonathon Bellows, MD;  Location: [facility] ENDOSCOPY;  Service: Gastroenterology;  Laterality: N/A;  . ESOPHAGOGASTRODUODENOSCOPY (EGD) WITH PROPOFOL N/A 12/02/2019   Procedure: ESOPHAGOGASTRODUODENOSCOPY (EGD) WITH PROPOFOL;  Surgeon: Jonathon Bellows, MD;  Location: [facility] ENDOSCOPY;  Service: Gastroenterology;  Laterality: N/A;  . HEMORRHOID SURGERY     Family History  Problem Relation Age of Onset  . Breast cancer Sister 2       materal 1/2 sister  . Hypertension Sister   . Hypertension Mother   . Heart disease Father   . Hypertension Brother     Allergies: Celecoxib, Pollen extract, Nasacort [triamcinolone], and Vicodin [hydrocodone-acetaminophen] Current Outpatient Medications on File Prior to Visit  Medication Sig Dispense Refill  . acetaminophen (TYLENOL) 500 MG  tablet Take by mouth.    [name] Kitchen acyclovir (ZOVIRAX) 400 MG tablet TAKE 1 TABLET BY MOUTH EVERY DAY 30 tablet 5  . Ascorbic Acid (VITAMIN C) 1000 MG tablet Take 1,000 mg by mouth daily.    [name] Kitchen aspirin 81 MG tablet Take 81 mg by mouth daily.    [name] Kitchen azelastine (ASTELIN) 0.1 % nasal spray USE 1 SPRAY EACH NOSTRIL TWICE A DAY AS NEEDED FOR ALLERGIES    . fexofenadine (ALLEGRA) 180 MG tablet TAKE 1 TABLET BY MOUTH EVERY DAY 30 tablet 3  . fluticasone (FLONASE) 50 MCG/ACT nasal spray PLACE 1 SPRAY INTO BOTH NOSTRILS DAILY. 16 mL 2  . gabapentin (NEURONTIN) 100 MG capsule Take two tablet in the morning and two tablets midday PO. 120 capsule 2  . gabapentin  (NEURONTIN) 300 MG capsule Take 1 capsule (300 mg total) by mouth at bedtime. 90 capsule 3  . hydrochlorothiazide (MICROZIDE) 12.5 MG capsule TAKE 1 CAPSULE BY MOUTH EVERY DAY 30 capsule 2  . hydrocortisone (ANUSOL-HC) 25 MG suppository Place 1 suppository (25 mg total) rectally 2 (two) times daily as needed for hemorrhoids or anal itching. 12 suppository 2  . levothyroxine (SYNTHROID) 50 MCG tablet TAKE 1 TABLET BY MOUTH EVERY DAY 30 tablet 5  . losartan (COZAAR) 100 MG tablet Take 1 tablet (100 mg total) by mouth at bedtime. 90 tablet 2  . Multiple Vitamins-Minerals (BARIATRIC MULTIVITAMINS/IRON PO) Take by mouth.    [name] Kitchen omeprazole (PRILOSEC) 20 MG capsule Take 1 capsule (20 mg total) by mouth 2 (two) times daily before a meal. 120 capsule 1  . Propylene Glycol (SYSTANE BALANCE) 0.6 % SOLN Apply to eye.    . traZODone (DESYREL) 100 MG tablet TAKE 1 TABLET BY MOUTH EVERYDAY AT BEDTIME 90 tablet 2   No current facility-administered medications on file prior to visit.    Social History   Tobacco Use  . Smoking status: Former Smoker    Packs/day: 1.50    Years: 24.00    Pack years: 36.00    Types: Cigarettes    Quit date: 1993    Years since quitting: 29.1  . Smokeless tobacco: Never Used  . Tobacco comment: quit 1995.   Vaping Use  . Vaping Use: Never used  Substance Use Topics  . Alcohol use: Not Currently  . Drug use: No    Review of Systems  Constitutional: Negative for chills and fever.  Eyes: Negative for visual disturbance.  Respiratory: Negative for cough and shortness of breath.   Cardiovascular: Negative for chest pain, palpitations and leg swelling.  Gastrointestinal: Negative for nausea and vomiting.  Musculoskeletal: Positive for arthralgias.  Neurological: Positive for numbness (resolved, tongue) and headaches (resolved ). Negative for dizziness and weakness.      Objective:    BP (!) 190/80   Pulse (!) 55   Resp 16   SpO2 99%    Physical Exam Vitals  reviewed.  Constitutional:      Appearance: She is well-developed and well-nourished.  HENT:     Mouth/Throat:     Mouth: Oropharynx is clear and moist and mucous membranes are normal.     Pharynx: Uvula midline.  Eyes:     Extraocular Movements: EOM normal.     Conjunctiva/sclera: Conjunctivae normal.     Pupils: Pupils are equal, round, and reactive to light.     Comments: Fundus normal bilaterally.   Cardiovascular:     Rate and Rhythm: Normal rate and regular rhythm.  Pulses: Normal pulses.     Heart sounds: Normal heart sounds.  Pulmonary:     Effort: Pulmonary effort is normal.     Breath sounds: Normal breath sounds. No wheezing, rhonchi or rales.  Skin:    General: Skin is warm and dry.  Neurological:     Mental Status: She is alert.     Cranial Nerves: No cranial nerve deficit.     Sensory: No sensory deficit.     Coordination: She displays a negative Romberg sign.     Deep Tendon Reflexes: Strength normal.     Reflex Scores:      Bicep reflexes are 2+ on the right side and 2+ on the left side.      Patellar reflexes are 2+ on the right side and 2+ on the left side.    Comments: Grip equal and strong bilateral upper extremities. Gait strong and steady as she walked across exam room.  Able to perform rapid alternating movement without difficulty.   Psychiatric:        Mood and Affect: Mood and affect normal.        Speech: Speech normal.        Behavior: Behavior normal.        Thought Content: Thought content normal.        Assessment & Plan:   Problem List Items Addressed This Visit      Cardiovascular and Mediastinum   Essential hypertension - Primary    Blood pressure improved as patient rested in room. She had not taken losartan today and she took 100mg  losartan as prescribed while in exam room with me. She has hctz 12.5mg  at home however has taken today. Suspect back pain, left shoulder pain have played a role in escalation of blood pressure as well as  meloxicam. EKG showed sinus bradycardia without acute ischemia. No significant changes from prior EKG done by me on 02/25/20. EKG in ED 03/08/20 shows sinus bradycardia and no significant differences when compared to today. Patient had very reassuring neurologic exam today. She looks  well. In constellation of symptoms over the last several days, notably HA, tongue numbness, left arm pain has resolved. She feels well while in exam room. We discussed differentials including risk factors for CVA and general unease with constellation of symptoms and  lack of neuroimaging. Advised patient to return to ED for completion of work up. She as very much in agreement and declined EMS transport, she preferred to drive herself and understood the risks of this decision. She is in ED at time of this note.  For blood pressure management, she may require increase of HCTZ to 25mg  with losartan 100mg .       Relevant Orders   EKG 12-Lead (Completed)         I have discontinued Latara Rosenbloom's sertraline. I am also having her maintain her aspirin, hydrocortisone, azelastine, acetaminophen, vitamin C, Multiple Vitamins-Minerals (BARIATRIC MULTIVITAMINS/IRON PO), Systane Balance, losartan, levothyroxine, acyclovir, hydrochlorothiazide, fexofenadine, traZODone, omeprazole, gabapentin, gabapentin, and fluticasone.   No orders of the defined types were placed in this encounter.   Return precautions given.   Risks, benefits, and alternatives of the medications and treatment plan prescribed today were discussed, and patient expressed understanding.   Education regarding symptom management and diagnosis given to patient on AVS.  Continue to follow with [name], FNP for routine health maintenance.   Jennifer [name] and I agreed with plan.   [name], FNP

## 2020-03-10 NOTE — ED Triage Notes (Signed)
Pt in w/L arm pain numbness that began over the weekend. Is having a sharp HA as well. Went to PCP today, and was 180/100. Denies any blurred vision or weakness to LUE. Neuro and speech intact. BP 170/67 in triage.

## 2020-03-10 NOTE — Telephone Encounter (Signed)
Pt came in for labs and states that her blood pressure is 210/90-she states that her blood pressure is still running high even after taking her medication. She has an appt with cardiology on Thursday but wants to talk to PCP before then because she is worried.

## 2020-03-10 NOTE — Assessment & Plan Note (Addendum)
Blood pressure improved as patient rested in room. She had not taken losartan today and she took 100mg  losartan as prescribed while in exam room with me. She has hctz 12.5mg  at home however has taken today. Suspect back pain, left shoulder pain have played a role in escalation of blood pressure as well as meloxicam. EKG showed sinus bradycardia without acute ischemia. No significant changes from prior EKG done by me on 02/25/20. EKG in ED 03/08/20 shows sinus bradycardia and no significant differences when compared to today. Patient had very reassuring neurologic exam today. She looks  well. In constellation of symptoms over the last several days, notably HA, tongue numbness, left arm pain has resolved. She feels well while in exam room. We discussed differentials including risk factors for CVA and general unease with constellation of symptoms and  lack of neuroimaging. Advised patient to return to ED for completion of work up. She as very much in agreement and declined EMS transport, she preferred to drive herself and understood the risks of this decision. She is in ED at time of this note.  For blood pressure management, she may require increase of HCTZ to 25mg  with losartan 100mg .

## 2020-03-10 NOTE — ED Notes (Signed)
Pt back from CT

## 2020-03-10 NOTE — ED Provider Notes (Addendum)
[facility] Emergency Department Provider Note  ____________________________________________   Event Date/Time   First MD Initiated Contact with Patient 03/10/20 1005     (approximate)  I have reviewed the triage vital signs and the nursing notes.   HISTORY  Chief Complaint Hypertension, Numbness, and Headache    HPI Jennifer [name] is a 70 y.o. female with bariatric surgery, hypertension who comes in for elevated blood pressure and headaches.  Patient reports for a week she has had intermittent headaches.  Her current headache is a 4 out of 10, right side of her neck.  It is moderate, has not take anything to try to help it, nothing makes it worse except for when her blood pressure is elevated.  Denies any neck manipulation or recent trauma.  She reports occasionally having left arm dull ache.  When asked if she felt numb she stated no that she could still feel she said there was achiness that went from her shoulder to her wrist.  She states that this sensation is no longer there.  All she has is the headache at this time.  She denies any changes in her voice, any aphasia, changes in her face, weakness in her arms or legs, chest pain, shortness of breath.  Patient is on losartan 100 mg and hydrochlorothiazide 12.5 mg.  She is follow-up with Dr. Clayborn Bigness  on Thursday for her blood pressure          Past Medical History:  Diagnosis Date  . Anxiety   . Bariatric surgery status   . Constipation   . Dysrhythmia   . Elevated liver enzymes   . GERD (gastroesophageal reflux disease)   . Hemorrhoids   . Herpes genitalis   . High cholesterol   . Hyperlipidemia   . Hypertension   . Hypothyroidism   . Neuropathy   . Osteoarthritis   . Sleep apnea     Patient Active Problem List   Diagnosis Date Noted  . Irregular heartbeat 02/25/2020  . Diabetes mellitus without complication ([place]) 35/45/6256  . Aspirin long-term use 10/22/2019  . Wound ballistics  08/29/2019  . Displacement of lumbar intervertebral disc without myelopathy 08/29/2019  . Helicobacter pylori gastrointestinal tract infection 08/29/2019  . Hypercholesterolemia 08/29/2019  . Phlebitis after infusion 06/26/2019  . Superficial thrombophlebitis of left upper extremity 06/26/2019  . BMI 38.0-38.9,adult 06/19/2019  . Small bowel obstruction ([place]) 05/25/2019  . Morbid obesity ([place]) 05/16/2019  . Neck pain 04/09/2019  . Pedal edema 03/06/2019  . Pre-operative clearance 03/06/2019  . Pacemaker 02/01/2019  . History of sleeve gastrectomy 01/23/2019  . BMI 40.0-44.9, adult ([place]) 11/14/2018  . Chronic pain syndrome 10/03/2018  . Obstructive sleep apnea 10/03/2018  . Rotator cuff tendinitis, left 06/08/2018  . Chronic venous insufficiency 05/24/2018  . Lymphedema 05/24/2018  . Lumbar spondylosis 05/17/2018  . Leg swelling 05/09/2018  . Peripheral neuropathy 01/24/2018  . Insomnia 07/12/2017  . Fracture of phalanx of finger 05/31/2017  . Impingement syndrome of shoulder region 05/31/2017  . Constipation 12/15/2016  . Hemorrhoids 12/15/2016  . Hypothyroidism 10/03/2016  . Chronic shoulder bursitis 08/30/2016  . Positive ANA (antinuclear antibody) 08/30/2016  . Bradycardia 06/16/2016  . History of adenomatous polyp of colon 05/02/2016  . Elevated liver enzymes 04/06/2016  . OSA (obstructive sleep apnea) 04/06/2016  . Bilateral shoulder pain 01/13/2016  . Fatty liver 12/08/2015  . Arthritis 12/08/2015  . History of bariatric surgery 12/07/2015  . Genital herpes 11/11/2015  . Hyperlipidemia 11/11/2015  .  Essential hypertension 11/11/2015  . Routine physical examination 11/11/2015  . Allergic rhinitis 11/11/2015  . GERD (gastroesophageal reflux disease) 11/11/2015  . Herpesviral infection, unspecified 01/08/2014  . Disorder of thyroid, unspecified 01/08/2014  . Lumbar radiculitis 11/19/2013  . Neuritis or radiculitis due to rupture of lumbar intervertebral disc  11/19/2013  . Monilial vaginitis 08/20/2013  . Gastritis and duodenitis 07/25/2013  . Impaired fasting glucose 07/17/2012  . Obesity, unspecified 07/17/2012  . Obesity 07/17/2012  . Anxiety and depression 03/15/2012  . Abnormal electrocardiogram (ECG) (EKG) 03/06/2012  . URI (upper respiratory infection) 01/16/2012    Past Surgical History:  Procedure Laterality Date  . ABDOMINAL HYSTERECTOMY     total for fibroids no h/o abnormal pap  . bariatric sleeve  2015  . BREAST EXCISIONAL BIOPSY Left 1998  . carpal tunnel repair    . COLONOSCOPY WITH PROPOFOL N/A 02/11/2016   Procedure: COLONOSCOPY WITH PROPOFOL;  Surgeon: Jonathon Bellows, MD;  Location: [facility] ENDOSCOPY;  Service: Endoscopy;  Laterality: N/A;  . COLONOSCOPY WITH PROPOFOL N/A 10/01/2019   Procedure: COLONOSCOPY WITH PROPOFOL;  Surgeon: Jonathon Bellows, MD;  Location: [facility] ENDOSCOPY;  Service: Gastroenterology;  Laterality: N/A;  . ESOPHAGOGASTRODUODENOSCOPY (EGD) WITH PROPOFOL N/A 12/02/2019   Procedure: ESOPHAGOGASTRODUODENOSCOPY (EGD) WITH PROPOFOL;  Surgeon: Jonathon Bellows, MD;  Location: [facility] ENDOSCOPY;  Service: Gastroenterology;  Laterality: N/A;  . HEMORRHOID SURGERY      Prior to Admission medications   Medication Sig Start Date End Date Taking? Authorizing Provider  acetaminophen (TYLENOL) 500 MG tablet Take by mouth.    [provider]  acyclovir (ZOVIRAX) 400 MG tablet TAKE 1 TABLET BY MOUTH EVERY DAY 10/22/19   [name], FNP  Ascorbic Acid (VITAMIN C) 1000 MG tablet Take 1,000 mg by mouth daily.    [provider]  aspirin 81 MG tablet Take 81 mg by mouth daily.    [provider]  azelastine (ASTELIN) 0.1 % nasal spray USE 1 SPRAY EACH NOSTRIL TWICE A DAY AS NEEDED FOR ALLERGIES 08/09/18   [provider]  fexofenadine (ALLEGRA) 180 MG tablet TAKE 1 TABLET BY MOUTH EVERY DAY 02/04/20   [name], FNP  fluticasone (FLONASE) 50 MCG/ACT nasal spray PLACE 1 SPRAY INTO BOTH  NOSTRILS DAILY. 03/02/20   [name], FNP  gabapentin (NEURONTIN) 100 MG capsule Take two tablet in the morning and two tablets midday PO. 02/25/20   [name], FNP  gabapentin (NEURONTIN) 300 MG capsule Take 1 capsule (300 mg total) by mouth at bedtime. 02/25/20   [name], FNP  hydrochlorothiazide (MICROZIDE) 12.5 MG capsule TAKE 1 CAPSULE BY MOUTH EVERY DAY 01/20/20   [name], FNP  hydrocortisone (ANUSOL-HC) 25 MG suppository Place 1 suppository (25 mg total) rectally 2 (two) times daily as needed for hemorrhoids or anal itching. 03/27/17   [name], FNP  levothyroxine (SYNTHROID) 50 MCG tablet TAKE 1 TABLET BY MOUTH EVERY DAY 10/22/19   [name], FNP  losartan (COZAAR) 100 MG tablet Take 1 tablet (100 mg total) by mouth at bedtime. 10/21/19   [name], FNP  Multiple Vitamins-Minerals (BARIATRIC MULTIVITAMINS/IRON PO) Take by mouth.    [provider]  omeprazole (PRILOSEC) 20 MG capsule Take 1 capsule (20 mg total) by mouth 2 (two) times daily before a meal. 02/25/20   Arnett, Yvetta Coder, FNP  Propylene Glycol (SYSTANE BALANCE) 0.6 % SOLN Apply to eye.    [provider]  traZODone (DESYREL) 100 MG tablet  TAKE 1 TABLET BY MOUTH EVERYDAY AT BEDTIME 02/24/20   [name], FNP    Allergies Celecoxib, Pollen extract, Nasacort [triamcinolone], and Vicodin [hydrocodone-acetaminophen]  Family History  Problem Relation Age of Onset  . Breast cancer Sister 7       materal 1/2 sister  . Hypertension Sister   . Hypertension Mother   . Heart disease Father   . Hypertension Brother     Social History Social History   Tobacco Use  . Smoking status: Former Smoker    Packs/day: 1.50    Years: 24.00    Pack years: 36.00    Types: Cigarettes    Quit date: 1993    Years since quitting: 29.1  . Smokeless tobacco: Never Used  . Tobacco comment: quit 1995.   Vaping Use  . Vaping Use: Never used  Substance Use  Topics  . Alcohol use: Not Currently  . Drug use: No      Review of Systems Constitutional: No fever/chills Eyes: No visual changes. ENT: No sore throat. Cardiovascular: Denies chest pain. Respiratory: Denies shortness of breath. Gastrointestinal: No abdominal pain.  No nausea, no vomiting.  No diarrhea.  No constipation. Genitourinary: Negative for dysuria. Musculoskeletal: Negative for back pain. Skin: Negative for rash. Neurological: Positive headaches and left arm pain All other ROS negative ____________________________________________   PHYSICAL EXAM:  VITAL SIGNS: ED Triage Vitals [03/10/20 0942]  Enc Vitals Group     BP (!) 170/67     Pulse Rate (!) 55     Resp 18     Temp 98.1 F (36.7 C)     Temp Source Oral     SpO2 100 %     Weight      Height      Head Circumference      Peak Flow      Pain Score      Pain Loc      Pain Edu?      Excl. in [place]?     Constitutional: Alert and oriented. Well appearing and in no acute distress. Eyes: Conjunctivae are normal. EOMI. Head: Atraumatic. Nose: No congestion/rhinnorhea. Mouth/Throat: Mucous membranes are moist.   Neck: No stridor. Trachea Midline. FROM Cardiovascular: Normal rate, regular rhythm. Grossly normal heart sounds.  Good peripheral circulation. Respiratory: Normal respiratory effort.  No retractions. Lungs CTAB. Gastrointestinal: Soft and nontender. No distention. No abdominal bruits.  Musculoskeletal: No lower extremity tenderness nor edema.  No joint effusions. Neurologic:  Normal speech and language.  Cranial nerves II through XII are intact.  Equal strength in arms and legs.  Sensation intact throughout Skin:  Skin is warm, dry and intact. No rash noted. Psychiatric: Mood and affect are normal. Speech and behavior are normal. GU: Deferred   ____________________________________________   LABS (all labs ordered are listed, but only abnormal results are displayed)  Labs Reviewed   COMPREHENSIVE METABOLIC PANEL - Abnormal; Notable for the following components:      Result Value   AST 50 (*)    All other components within normal limits  URINALYSIS, COMPLETE (UACMP) WITH MICROSCOPIC - Abnormal; Notable for the following components:   Color, Urine COLORLESS (*)    APPearance CLEAR (*)    Specific Gravity, Urine 1.002 (*)    All other components within normal limits  CBC WITH DIFFERENTIAL/PLATELET  TROPONIN I (HIGH SENSITIVITY)   ____________________________________________   ED ECG REPORT I, Vanessa [place], the attending physician, personally viewed and interpreted this ECG.  Sinus  bradycardia rate of 56, no ST elevation, no T wave inversion, normal intervals ____________________________________________  RADIOLOGY   Official radiology report(s): CT Head Wo Contrast  Result Date: 03/10/2020 CLINICAL DATA:  Headache, classic migraine.  Left arm numbness. EXAM: CT HEAD WITHOUT CONTRAST TECHNIQUE: Contiguous axial images were obtained from the base of the skull through the vertex without intravenous contrast. COMPARISON:  None. FINDINGS: Brain: No evidence of acute infarction, hemorrhage, hydrocephalus, extra-axial collection or mass lesion/mass effect. Vascular: Negative for hyperdense vessel Skull: Negative Sinuses/Orbits: Negative Other: None IMPRESSION: Negative CT head Electronically Signed   By: Franchot Gallo M.D.   On: 03/10/2020 11:18    ____________________________________________   PROCEDURES  Procedure(s) performed (including Critical Care):  Procedures   ____________________________________________   INITIAL IMPRESSION / ASSESSMENT AND PLAN / ED COURSE  Akansha Wyche was evaluated in Emergency Department on 03/10/2020 for the symptoms described in the history of present illness. She was evaluated in the context of the global COVID-19 pandemic, which necessitated consideration that the patient might be at risk for infection with the  SARS-CoV-2 virus that causes COVID-19. Institutional protocols and algorithms that pertain to the evaluation of patients at risk for COVID-19 are in a state of rapid change based on information released by regulatory bodies including the CDC and federal and state organizations. These policies and algorithms were followed during the patient's care in the ED.    Patient is a 70 year old who comes in with elevated blood pressure and headaches and left arm dull aching.  Patient denies it feeling like a numbness although this is what was written in triage although I asked her multiple times.  She still has sensation throughout.  Sensation is equal in her arms and legs.  NIH stroke scale is 0.  Will get labs to evaluate for electrolyte abnormalities, AKI, UTI.  CT head to evaluate for intracranial hemorrhage with her headaches and not severe and sudden in onset.  Patient is a history of headaches I suspect that this could be more like a complex migraine.  She denies any trauma or neck manipulation to suggest dissection.  No chest pain to suggest dissection.  And patient's arm pain is since resolved.  Pain right now is on the right side of her neck.  Will give migraine cocktail and reassess.  CT head was negative.  Labs are reassuring.  Cardiac markers was negative.  After migraine cocktail her symptoms have since resolved.  At this time I have very low suspicion for stroke/edema and do not feel like MRI is necessary.    Her blood pressures come down to 135/69.  I discussed with patient and she stated that she did not take her hydrochlorothiazide this morning however even with this her blood pressures have been running over 140s consistently for the past few days.  She has had no recent changes to her blood pressure medication.  I told her that she should take her blood pressures once the morning once at night and if her systolic blood pressures over 140 she can go up on her hydrochlorothiazide to 2 pills.  She  should record whether or not she needs to do a second pill of follow-up with Dr. Clayborn Bigness for blood pressure recheck.  ____________________________________________   FINAL CLINICAL IMPRESSION(S) / ED DIAGNOSES   Final diagnoses:  Hypertension, unspecified type      MEDICATIONS GIVEN DURING THIS VISIT:  Medications  acetaminophen (TYLENOL) tablet 1,000 mg (1,000 mg Oral Given 03/10/20 1028)  metoCLOPramide (REGLAN) injection 10  mg (10 mg Intravenous Given 03/10/20 1028)  diphenhydrAMINE (BENADRYL) injection 12.5 mg (12.5 mg Intravenous Given 03/10/20 1029)     ED Discharge Orders    None       Note:  This document was prepared using Dragon voice recognition software and may include unintentional dictation errors.   Vanessa [place], MD 03/10/20 1208    Vanessa [place], MD 03/10/20 1209    Vanessa [place], MD 03/10/20 1210

## 2020-03-10 NOTE — Patient Instructions (Addendum)
No mobic as it raises blood pressure.   Heat, tylenol arthritis. Salon Pas pain patch.   Advised patient to go immediately to closest emergency department , [facility]. I will call and let them you are coming.

## 2020-03-11 ENCOUNTER — Other Ambulatory Visit: Payer: Self-pay | Admitting: Family

## 2020-03-11 DIAGNOSIS — E039 Hypothyroidism, unspecified: Secondary | ICD-10-CM

## 2020-03-11 DIAGNOSIS — Z0000 Encounter for general adult medical examination without abnormal findings: Secondary | ICD-10-CM

## 2020-03-12 DIAGNOSIS — R0602 Shortness of breath: Secondary | ICD-10-CM | POA: Diagnosis not present

## 2020-03-12 DIAGNOSIS — I890 Lymphedema, not elsewhere classified: Secondary | ICD-10-CM | POA: Diagnosis not present

## 2020-03-12 DIAGNOSIS — R001 Bradycardia, unspecified: Secondary | ICD-10-CM | POA: Diagnosis not present

## 2020-03-12 DIAGNOSIS — I208 Other forms of angina pectoris: Secondary | ICD-10-CM | POA: Diagnosis not present

## 2020-03-12 DIAGNOSIS — R0600 Dyspnea, unspecified: Secondary | ICD-10-CM | POA: Diagnosis not present

## 2020-03-12 DIAGNOSIS — G4733 Obstructive sleep apnea (adult) (pediatric): Secondary | ICD-10-CM | POA: Diagnosis not present

## 2020-03-12 DIAGNOSIS — I872 Venous insufficiency (chronic) (peripheral): Secondary | ICD-10-CM | POA: Diagnosis not present

## 2020-03-12 DIAGNOSIS — I10 Essential (primary) hypertension: Secondary | ICD-10-CM | POA: Diagnosis not present

## 2020-03-12 DIAGNOSIS — R011 Cardiac murmur, unspecified: Secondary | ICD-10-CM | POA: Diagnosis not present

## 2020-03-12 DIAGNOSIS — K219 Gastro-esophageal reflux disease without esophagitis: Secondary | ICD-10-CM | POA: Diagnosis not present

## 2020-03-16 ENCOUNTER — Other Ambulatory Visit: Payer: Self-pay

## 2020-03-16 DIAGNOSIS — E7800 Pure hypercholesterolemia, unspecified: Secondary | ICD-10-CM

## 2020-03-16 DIAGNOSIS — I10 Essential (primary) hypertension: Secondary | ICD-10-CM

## 2020-03-16 NOTE — Addendum Note (Signed)
Addended by: Earlyne Iba on: 03/16/2020 09:38 AM   Modules accepted: Orders

## 2020-03-20 ENCOUNTER — Other Ambulatory Visit: Payer: Self-pay | Admitting: Family

## 2020-03-20 DIAGNOSIS — I10 Essential (primary) hypertension: Secondary | ICD-10-CM

## 2020-03-30 ENCOUNTER — Other Ambulatory Visit: Payer: Self-pay

## 2020-03-30 ENCOUNTER — Ambulatory Visit (INDEPENDENT_AMBULATORY_CARE_PROVIDER_SITE_OTHER): Payer: Medicare Other | Admitting: Family

## 2020-03-30 ENCOUNTER — Encounter: Payer: Self-pay | Admitting: Family

## 2020-03-30 VITALS — BP 130/70 | HR 65 | Temp 98.4°F | Ht 60.94 in | Wt 182.2 lb

## 2020-03-30 DIAGNOSIS — M25511 Pain in right shoulder: Secondary | ICD-10-CM | POA: Diagnosis not present

## 2020-03-30 DIAGNOSIS — G8929 Other chronic pain: Secondary | ICD-10-CM | POA: Diagnosis not present

## 2020-03-30 DIAGNOSIS — G894 Chronic pain syndrome: Secondary | ICD-10-CM

## 2020-03-30 DIAGNOSIS — I10 Essential (primary) hypertension: Secondary | ICD-10-CM

## 2020-03-30 DIAGNOSIS — R519 Headache, unspecified: Secondary | ICD-10-CM

## 2020-03-30 LAB — COMPREHENSIVE METABOLIC PANEL
ALT: 36 U/L — ABNORMAL HIGH (ref 0–35)
AST: 55 U/L — ABNORMAL HIGH (ref 0–37)
Albumin: 4.3 g/dL (ref 3.5–5.2)
Alkaline Phosphatase: 66 U/L (ref 39–117)
BUN: 11 mg/dL (ref 6–23)
CO2: 30 mEq/L (ref 19–32)
Calcium: 9.5 mg/dL (ref 8.4–10.5)
Chloride: 103 mEq/L (ref 96–112)
Creatinine, Ser: 0.68 mg/dL (ref 0.40–1.20)
GFR: 88.11 mL/min (ref >60.00)
Glucose, Bld: 86 mg/dL (ref 70–99)
Potassium: 4 mEq/L (ref 3.5–5.1)
Sodium: 141 mEq/L (ref 135–145)
Total Bilirubin: 0.5 mg/dL (ref 0.2–1.2)
Total Protein: 6.6 g/dL (ref 6.0–8.3)

## 2020-03-30 MED ORDER — DULOXETINE HCL 30 MG PO CPEP: 30.0000 mg | ORAL_CAPSULE | Freq: Every day | ORAL | 0 refills | Status: DC

## 2020-03-30 NOTE — Assessment & Plan Note (Addendum)
CT head no acute findings 03/10/20. Discussed medication overuse HA and poor compliance with cipap.  Consulted with pharmacy in regards to liver enzymes which are trending up. Will hold on starting cymbalta until completed evaluation ( RUQ [name]) , possible gi consult.  Consider effexor however worry about any escalation of blood pressure.  Referral to neurology for OSA evaluation ( possible retest), treatment, and assessment of HA.  Close follow up

## 2020-03-30 NOTE — Patient Instructions (Addendum)
   STOP ibuprofen and iburprofen as concerned causing rebound headache.   Start cymbalta 30mg  , and then likely increase to 60mg  when we are together for follow up.   Referral to neurology  Let us know if you dont hear back within a week in regards to an appointment being scheduled.

## 2020-03-30 NOTE — Progress Notes (Signed)
Subjective:    Patient ID: Jennifer [name], female    DOB: [birth year], 70 y.o.   MRN: [id number]  CC: Jennifer [name] is a 70 y.o. female who presents today for follow up.   HPI:  Complains of neck and low back pain, intermittent, for years.  She also complains of right shoulder pain and reduced range of motion for 6 months, comes and goes. She has felt a small knot anterior shoulder joint itself and doesn't know if related. She has stopped using mobic.Takes gabapentin 200mg  qam, 200mg  at noon and then gabapentin 300mg  qhs prn with some relief.   Complains of HA behind left eye. [name] is daily. HA for 2- weeks ago. HA has not improved with blood pressure coming down.  HA is not worse HA of life. Taking ibuprofen 400mg  daily for 2 weeks. HA is not positional.  No vision loss. Wears glasses. Last eye exam in 2021 and told she had cataracts. Reports intermittent numbness right side of face. No numbness today.  She is wearing cipap machine however not wearing longer than 3-4 hours.   HTN- at home 135/70. Takes hctz 12.5mg  and then takes additional 12.5mg  if BP elevated over 140/80 or she will take an additional 5mg  amlodipine.     Seen in ED 03/10/20 for hypertension and HA 2 weeks ago. CT head negative for infarction.  Troponin 4 03/12/20 with Dr Clayborn Bigness whom increased HCTZ to 12.5mg  and added amlodipine 5mg  q12 hrs. Continue losartan 100mg  qhs. She is not on torsemide.       has seen dr Roland Rack for right shoulder pain Bilateral shoulder pain has seen Damaris Hippo, PA 6 months ago. She was given bilateral susubacromial steroid injections and reports temporary improvement.    MR right shoulder 04/2019 -Moderate rotator cuff tendinosis including a tiny interstitial  tear at the anterior insertional fibers ofthe supraspinatus tendon.  No full-thickness rotator cuff tear.   DG cervical spine 03/2019 - Mild cervical spondylosis, most pronounced at C5-6 and C6-7.  MR lumbar spine 02/2016-  Mild right foraminal narrowing L4-5 due to facet hypertrophy and disc bulging.  5 mm cyst to the left of the L3-4 interspinous space likely degenerative in nature.  Mild right foraminal narrowing L5-S1 due to disc and facet degeneration.  H/o carpal tunnel repair   EKG 03/10/20 QTC 438 HISTORY:  Past Medical History:  Diagnosis Date  . Anxiety   . Bariatric surgery status   . Constipation   . Dysrhythmia   . Elevated liver enzymes   . GERD (gastroesophageal reflux disease)   . Hemorrhoids   . Herpes genitalis   . High cholesterol   . Hyperlipidemia   . Hypertension   . Hypothyroidism   . Neuropathy   . Osteoarthritis   . Sleep apnea    Past Surgical History:  Procedure Laterality Date  . ABDOMINAL HYSTERECTOMY     total for fibroids no h/o abnormal pap  . bariatric sleeve  2015  . BREAST EXCISIONAL BIOPSY Left 1998  . carpal tunnel repair    . COLONOSCOPY WITH PROPOFOL N/A 02/11/2016   Procedure: COLONOSCOPY WITH PROPOFOL;  Surgeon: Jonathon Bellows, MD;  Location: [facility] ENDOSCOPY;  Service: Endoscopy;  Laterality: N/A;  . COLONOSCOPY WITH PROPOFOL N/A 10/01/2019   Procedure: COLONOSCOPY WITH PROPOFOL;  Surgeon: Jonathon Bellows, MD;  Location: [facility] ENDOSCOPY;  Service: Gastroenterology;  Laterality: N/A;  . ESOPHAGOGASTRODUODENOSCOPY (EGD) WITH PROPOFOL N/A 12/02/2019   Procedure: ESOPHAGOGASTRODUODENOSCOPY (EGD) WITH PROPOFOL;  Surgeon: Jonathon Bellows, MD;  Location: [facility] ENDOSCOPY;  Service: Gastroenterology;  Laterality: N/A;  . HEMORRHOID SURGERY     Family History  Problem Relation Age of Onset  . Breast cancer Sister 69       materal 1/2 sister  . Hypertension Sister   . Hypertension Mother   . Heart disease Father   . Hypertension Brother     Allergies: Celecoxib, Pollen extract, Nasacort [triamcinolone], and Vicodin [hydrocodone-acetaminophen] Current Outpatient Medications on File Prior to Visit  Medication Sig Dispense Refill  . acyclovir (ZOVIRAX) 400 MG tablet  TAKE 1 TABLET BY MOUTH EVERY DAY 30 tablet 5  . Ascorbic Acid (VITAMIN C) 1000 MG tablet Take 1,000 mg by mouth daily.    [name] Kitchen aspirin 81 MG tablet Take 81 mg by mouth daily.    [name] Kitchen azelastine (ASTELIN) 0.1 % nasal spray USE 1 SPRAY EACH NOSTRIL TWICE A DAY AS NEEDED FOR ALLERGIES    . fexofenadine (ALLEGRA) 180 MG tablet TAKE 1 TABLET BY MOUTH EVERY DAY 30 tablet 3  . fluticasone (FLONASE) 50 MCG/ACT nasal spray PLACE 1 SPRAY INTO BOTH NOSTRILS DAILY. 16 mL 2  . gabapentin (NEURONTIN) 100 MG capsule Take two tablet in the morning and two tablets midday PO. 120 capsule 2  . gabapentin (NEURONTIN) 300 MG capsule Take 1 capsule (300 mg total) by mouth at bedtime. 90 capsule 3  . hydrochlorothiazide (MICROZIDE) 12.5 MG capsule TAKE 1 CAPSULE BY MOUTH EVERY DAY 30 capsule 2  . hydrocortisone (ANUSOL-HC) 25 MG suppository Place 1 suppository (25 mg total) rectally 2 (two) times daily as needed for hemorrhoids or anal itching. 12 suppository 2  . levothyroxine (SYNTHROID) 50 MCG tablet TAKE 1 TABLET BY MOUTH EVERY DAY 30 tablet 5  . losartan (COZAAR) 100 MG tablet Take 1 tablet (100 mg total) by mouth at bedtime. 90 tablet 2  . Multiple Vitamins-Minerals (BARIATRIC MULTIVITAMINS/IRON PO) Take by mouth.    [name] Kitchen omeprazole (PRILOSEC) 20 MG capsule Take 1 capsule (20 mg total) by mouth 2 (two) times daily before a meal. 120 capsule 1  . Propylene Glycol (SYSTANE BALANCE) 0.6 % SOLN Apply to eye.    . traZODone (DESYREL) 100 MG tablet TAKE 1 TABLET BY MOUTH EVERYDAY AT BEDTIME 90 tablet 2  . acetaminophen (TYLENOL) 500 MG tablet Take by mouth. (Patient not taking: Reported on 03/30/2020)     No current facility-administered medications on file prior to visit.    Social History   Tobacco Use  . Smoking status: Former Smoker    Packs/day: 1.50    Years: 24.00    Pack years: 36.00    Types: Cigarettes    Quit date: 1993    Years since quitting: 29.2  . Smokeless tobacco: Never Used  . Tobacco comment: quit  1995.   Vaping Use  . Vaping Use: Never used  Substance Use Topics  . Alcohol use: Not Currently  . Drug use: No    Review of Systems  Constitutional: Negative for chills and fever.  Eyes: Negative for visual disturbance.  Respiratory: Negative for cough.   Cardiovascular: Negative for chest pain and palpitations.  Gastrointestinal: Negative for nausea and vomiting.  Musculoskeletal: Positive for arthralgias and neck pain.  Neurological: Positive for numbness and headaches. Negative for dizziness.      Objective:    BP 130/70   Pulse 65   Temp 98.4 F (36.9 C)   Ht 5' 0.94" (1.548 m)   Wt 182 lb 3.2 oz (82.6 kg)   SpO2 97%  BMI 34.49 kg/m  BP Readings from Last 3 Encounters:  03/30/20 130/70  03/10/20 (!) 148/66  03/10/20 (!) 190/80   Wt Readings from Last 3 Encounters:  03/30/20 182 lb 3.2 oz (82.6 kg)  03/08/20 182 lb (82.6 kg)  02/25/20 182 lb 12.8 oz (82.9 kg)    Physical Exam Vitals reviewed.  Constitutional:      Appearance: She is well-developed.  HENT:     Mouth/Throat:     Pharynx: Uvula midline.  Eyes:     Conjunctiva/sclera: Conjunctivae normal.     Pupils: Pupils are equal, round, and reactive to light.     Comments: Fundus normal bilaterally.   Cardiovascular:     Rate and Rhythm: Normal rate and regular rhythm.     Pulses: Normal pulses.     Heart sounds: Normal heart sounds.  Pulmonary:     Effort: Pulmonary effort is normal.     Breath sounds: Normal breath sounds. No wheezing, rhonchi or rales.  Musculoskeletal:     Right shoulder: Tenderness present. No swelling, deformity or crepitus. Decreased range of motion. Normal strength. Normal pulse.     Left shoulder: No swelling or deformity.       Arms:     Cervical back: Normal range of motion. No torticollis. Muscular tenderness present. No pain with movement. Normal range of motion.     Comments: Circumscribed non tender fluid filed lesion noted anterior shoulder joint. Non fluctuate.  No erythema, or purulence.   Skin:    General: Skin is warm and dry.  Neurological:     Mental Status: She is alert.     Cranial Nerves: No cranial nerve deficit.     Sensory: No sensory deficit.     Deep Tendon Reflexes:     Reflex Scores:      Bicep reflexes are 2+ on the right side and 2+ on the left side.      Patellar reflexes are 2+ on the right side and 2+ on the left side.    Comments: Grip equal and strong bilateral upper extremities. Gait strong and steady. Able to perform rapid alternating movement without difficulty.   Psychiatric:        Speech: Speech normal.        Behavior: Behavior normal.        Thought Content: Thought content normal.        Assessment & Plan:   Problem List Items Addressed This Visit      Cardiovascular and Mediastinum   Essential hypertension    Improved. Continue HCTZ 12.5mg , amlodipine 5mg  qd, losartan 100mg  qhs. She seemed confused regarding what to take blood pressure > 140/80. I advised to take additional dose of HCTZ 12.5mg  if needed. Will discuss this again at follow up.       Relevant Orders   Comprehensive metabolic panel (Completed)     Other   Chronic pain syndrome - Primary   Headache    CT head no acute findings 03/10/20. Discussed medication overuse HA and poor compliance with cipap.  Consulted with pharmacy in regards to liver enzymes which are trending up. Will hold on starting cymbalta until completed evaluation ( RUQ [name]) , possible gi consult.  Consider effexor however worry about any escalation of blood pressure.  Referral to neurology for OSA evaluation ( possible retest), treatment, and assessment of HA.  Close follow up      Relevant Orders   Ambulatory referral to Neurology   Right shoulder pain  Acute on chronic. MRI righ shoulder shows moderate rotator cuff tendinosis and lipoma which was felt on exam.  We are holding on starting cymbalta due to elevated LFTs. Advised to continue gabapentin 200mg  qam,  200mg  at noon and then to take gabapentin 300mg  qhs consistently at night to see if benefit. Advised patient to return to Dr Roland Rack as well for further evaluation. May consider pain management as well.           I have discontinued Melis Dasaro's DULoxetine. I am also having her maintain her aspirin, hydrocortisone, azelastine, acetaminophen, vitamin C, Multiple Vitamins-Minerals (BARIATRIC MULTIVITAMINS/IRON PO), Systane Balance, losartan, fexofenadine, traZODone, omeprazole, gabapentin, gabapentin, fluticasone, acyclovir, levothyroxine, and hydrochlorothiazide.   Meds ordered this encounter  Medications  . DISCONTD: DULoxetine (CYMBALTA) 30 MG capsule    Sig: Take 1 capsule (30 mg total) by mouth daily.    Dispense:  90 capsule    Refill:  0    Order Specific Question:   Supervising Provider    Answer:   Crecencio Mc [[id number]]    Return precautions given.   Risks, benefits, and alternatives of the medications and treatment plan prescribed today were discussed, and patient expressed understanding.   Education regarding symptom management and diagnosis given to patient on AVS.  Continue to follow with [name], FNP for routine health maintenance.   Jennifer [name] and I agreed with plan.   [name], FNP

## 2020-03-31 ENCOUNTER — Other Ambulatory Visit: Payer: Self-pay | Admitting: Family

## 2020-03-31 ENCOUNTER — Other Ambulatory Visit: Payer: Medicare Other

## 2020-03-31 DIAGNOSIS — R748 Abnormal levels of other serum enzymes: Secondary | ICD-10-CM

## 2020-03-31 NOTE — Assessment & Plan Note (Addendum)
Improved. Continue HCTZ 12.5mg , amlodipine 5mg  qd, losartan 100mg  qhs. She seemed confused regarding what to take blood pressure > 140/80. I advised to take additional dose of HCTZ 12.5mg  if needed. Will discuss this again at follow up.

## 2020-03-31 NOTE — Assessment & Plan Note (Addendum)
Acute on chronic. MRI righ shoulder shows moderate rotator cuff tendinosis and lipoma which was felt on exam.  We are holding on starting cymbalta due to elevated LFTs. Advised to continue gabapentin 200mg  qam, 200mg  at noon and then to take gabapentin 300mg  qhs consistently at night to see if benefit. Advised patient to return to Dr Roland Rack as well for further evaluation. May consider pain management as well.

## 2020-04-07 ENCOUNTER — Ambulatory Visit: Payer: Medicare Other | Admitting: Family

## 2020-04-16 ENCOUNTER — Other Ambulatory Visit: Payer: Self-pay

## 2020-04-16 ENCOUNTER — Ambulatory Visit
Admission: RE | Admit: 2020-04-16 | Discharge: 2020-04-16 | Disposition: A | Discharge status: Home / Self Care | Payer: Medicare Other | Source: Ambulatory Visit | Attending: Family | Admitting: Family

## 2020-04-16 DIAGNOSIS — R945 Abnormal results of liver function studies: Secondary | ICD-10-CM | POA: Diagnosis not present

## 2020-04-16 DIAGNOSIS — K828 Other specified diseases of gallbladder: Secondary | ICD-10-CM | POA: Diagnosis not present

## 2020-04-16 DIAGNOSIS — R748 Abnormal levels of other serum enzymes: Secondary | ICD-10-CM | POA: Insufficient documentation

## 2020-04-20 ENCOUNTER — Other Ambulatory Visit: Payer: Self-pay | Admitting: Family

## 2020-04-20 DIAGNOSIS — K760 Fatty (change of) liver, not elsewhere classified: Secondary | ICD-10-CM

## 2020-04-30 ENCOUNTER — Encounter: Payer: Self-pay | Admitting: Family

## 2020-04-30 ENCOUNTER — Telehealth: Payer: Self-pay | Admitting: Family

## 2020-04-30 ENCOUNTER — Other Ambulatory Visit: Payer: Self-pay

## 2020-04-30 ENCOUNTER — Ambulatory Visit (INDEPENDENT_AMBULATORY_CARE_PROVIDER_SITE_OTHER): Payer: Medicare Other | Admitting: Family

## 2020-04-30 VITALS — BP 120/78 | HR 67 | Temp 98.3°F | Ht 60.94 in | Wt 179.2 lb

## 2020-04-30 DIAGNOSIS — I10 Essential (primary) hypertension: Secondary | ICD-10-CM

## 2020-04-30 DIAGNOSIS — H9313 Tinnitus, bilateral: Secondary | ICD-10-CM | POA: Diagnosis not present

## 2020-04-30 DIAGNOSIS — G4733 Obstructive sleep apnea (adult) (pediatric): Secondary | ICD-10-CM

## 2020-04-30 DIAGNOSIS — K219 Gastro-esophageal reflux disease without esophagitis: Secondary | ICD-10-CM | POA: Diagnosis not present

## 2020-04-30 MED ORDER — FAMOTIDINE 20 MG PO TABS: 20.0000 mg | ORAL_TABLET | Freq: Every day | ORAL | 1 refills | Status: DC

## 2020-04-30 NOTE — Patient Instructions (Signed)
Start pepcid ac at bedtime   Tinnitus Tinnitus refers to hearing a sound when there is no actual source for that sound. This is often described as ringing in the ears. However, people with this condition may hear a variety of noises, in one ear or in both ears. The sounds of tinnitus can be soft, loud, or somewhere in between. Tinnitus can last for a few seconds or can be constant for days. It may go away without treatment and come back at various times. When tinnitus is constant or happens often, it can lead to other problems, such as trouble sleeping and trouble concentrating. Almost everyone experiences tinnitus at some point. Tinnitus that is long-lasting (chronic) or comes back often (recurs) may require medical attention. What are the causes? The cause of tinnitus is often not known. In some cases, it can result from:  Exposure to loud noises from machinery, music, or other sources.  An object (foreign body) stuck in the ear.  Earwax buildup.  Drinking alcohol or caffeine.  Taking certain medicines.  Age-related hearing loss. It may also be caused by medical conditions such as:  Ear or sinus infections.  High blood pressure.  Heart diseases.  Anemia.  Allergies.  Meniere's disease.  Thyroid problems.  Tumors.  A weak, bulging blood vessel (aneurysm) near the ear. What are the signs or symptoms? The main symptom of tinnitus is hearing a sound when there is no source for that sound. It may sound like:  Buzzing.  Roaring.  Ringing.  Blowing air.  Hissing.  Whistling.  Sizzling.  Humming.  Running water.  A musical note.  Tapping. Symptoms may affect only one ear (unilateral) or both ears (bilateral). How is this diagnosed? Tinnitus is diagnosed based on your symptoms, your medical history, and a physical exam. Your health care provider may do a thorough hearing test (audiologic exam) if your tinnitus:  Is unilateral.  Causes hearing  difficulties.  Lasts 6 months or longer. You may work with a health care provider who specializes in hearing disorders (audiologist). You may be asked questions about your symptoms and how they affect your daily life. You may have other tests done, such as:  CT scan.  MRI.  An imaging test of how blood flows through your blood vessels (angiogram). How is this treated? Treating an underlying medical condition can sometimes make tinnitus go away. If your tinnitus continues, other treatments may include:  Medicines.  Therapy and counseling to help you manage the stress of living with tinnitus.  Sound generators to mask the tinnitus. These include: ? Tabletop sound machines that play relaxing sounds to help you fall asleep. ? Wearable devices that fit in your ear and play sounds or music. ? Acoustic neural stimulation. This involves using headphones to listen to music that contains an auditory signal. Over time, listening to this signal may change some pathways in your brain and make you less sensitive to tinnitus. This treatment is used for very severe cases when no other treatment is working.  Using hearing aids or cochlear implants if your tinnitus is related to hearing loss. Hearing aids are worn in the outer ear. Cochlear implants are surgically placed in the inner ear. Follow these instructions at home: Managing symptoms  When possible, avoid being in loud places and being exposed to loud sounds.  Wear hearing protection, such as earplugs, when you are exposed to loud noises.  Use a white noise machine, a humidifier, or other devices to mask the sound  of tinnitus.  Practice techniques for reducing stress, such as meditation, yoga, or deep breathing. Work with your health care provider if you need help with managing stress.  Sleep with your head slightly raised. This may reduce the impact of tinnitus.      General instructions  Do not use stimulants, such as nicotine, alcohol,  or caffeine. Talk with your health care provider about other stimulants to avoid. Stimulants are substances that can make you feel alert and attentive by increasing certain activities in the body (such as heart rate and blood pressure). These substances may make tinnitus worse.  Take over-the-counter and prescription medicines only as told by your health care provider.  Try to get plenty of sleep each night.  Keep all follow-up visits as told by your health care provider. This is important. Contact a health care provider if:  Your tinnitus continues for 3 weeks or longer without stopping.  You develop sudden hearing loss.  Your symptoms get worse or do not get better with home care.  You feel you are not able to manage the stress of living with tinnitus. Get help right away if:  You develop tinnitus after a head injury.  You have tinnitus along with any of the following: ? Dizziness. ? Loss of balance. ? Nausea and vomiting. ? Sudden, severe headache. These symptoms may represent a serious problem that is an emergency. Do not wait to see if the symptoms will go away. Get medical help right away. Call your local emergency services (911 in the [place] Do not drive yourself to the hospital. Summary  Tinnitus refers to hearing a sound when there is no actual source for that sound. This is often described as ringing in the ears.  Symptoms may affect only one ear (unilateral) or both ears (bilateral).  Use a white noise machine, a humidifier, or other devices to mask the sound of tinnitus.  Do not use stimulants, such as nicotine, alcohol, or caffeine. Talk with your health care provider about other stimulants to avoid. These substances may make tinnitus worse. This information is not intended to replace advice given to you by your health care provider. Make sure you discuss any questions you have with your health care provider. Document Revised: 07/17/2018 Document Reviewed:  10/13/2016 Elsevier Patient Education  2021 [employer].

## 2020-04-30 NOTE — Assessment & Plan Note (Signed)
Supoptimal and discussed nausea likely to be symptom of GERD. Start pepcid ac 20mg  qhs. Continue prilosec 20 mg bid.

## 2020-04-30 NOTE — Progress Notes (Signed)
Subjective:    Patient ID: Jennifer [name], female    DOB: 1951-01-15, 70 y.o.   MRN: [id number]  CC: Jennifer [name] is a 70 y.o. female who presents today for follow up.   HPI: She complains of bilateral tinnitus like a 'baby cricket' sound x one week, unchanged.  This has happened in the past. No associated HA,vertigo, fever, vision changes, facial pain. Endorses PND, and scant nasal congestion.  Chronic 'muffled hearing' in both ears and hearing loss in right ear.  Using azelastine prn without relief. She will take trazodone 100mg  qhs which helps her notice tinnitus less.   She has an appointment with Jennifer [name] . Last seen 11/2019 however unable to see note .  She also complains of intermittent nausea without vomiting, started this morning. More often occurs prior to eating. Endorses epigastric burning.No fever, abdominal pain, pain or trouble swallowing. Compliant with omeprazole 20 mg BID with some relief.   No NSAIDs. Very rare alcohol use.    HTN- compliant with  HCTZ 12.5mg , amlodipine 5mg  qd, losartan 100mg  qhs.No cp.  HA resolved. She doesn't feel she needs to see neurology.  We opted not to start cymbalta due to h/o elevated LFTs  OSA- compliant with Cipap      HISTORY:  Past Medical History:  Diagnosis Date  . Anxiety   . Bariatric surgery status   . Constipation   . Dysrhythmia   . Elevated liver enzymes   . GERD (gastroesophageal reflux disease)   . Hemorrhoids   . Herpes genitalis   . High cholesterol   . Hyperlipidemia   . Hypertension   . Hypothyroidism   . Neuropathy   . Osteoarthritis   . Sleep apnea    Past Surgical History:  Procedure Laterality Date  . ABDOMINAL HYSTERECTOMY     total for fibroids no h/o abnormal pap  . bariatric sleeve  2015  . BREAST EXCISIONAL BIOPSY Left 1998  . carpal tunnel repair    . COLONOSCOPY WITH PROPOFOL N/A 02/11/2016   Procedure: COLONOSCOPY WITH PROPOFOL;  Surgeon: Jennifer Bellows, MD;  Location: [facility]  ENDOSCOPY;  Service: Endoscopy;  Laterality: N/A;  . COLONOSCOPY WITH PROPOFOL N/A 10/01/2019   Procedure: COLONOSCOPY WITH PROPOFOL;  Surgeon: Jennifer Bellows, MD;  Location: [facility] ENDOSCOPY;  Service: Gastroenterology;  Laterality: N/A;  . ESOPHAGOGASTRODUODENOSCOPY (EGD) WITH PROPOFOL N/A 12/02/2019   Procedure: ESOPHAGOGASTRODUODENOSCOPY (EGD) WITH PROPOFOL;  Surgeon: Jennifer Bellows, MD;  Location: [facility] ENDOSCOPY;  Service: Gastroenterology;  Laterality: N/A;  . HEMORRHOID SURGERY     Family History  Problem Relation Age of Onset  . Breast cancer Sister 40       materal 1/2 sister  . Hypertension Sister   . Hypertension Mother   . Heart disease Father   . Hypertension Brother     Allergies: Celecoxib, Pollen extract, Nasacort [triamcinolone], and Vicodin [hydrocodone-acetaminophen] Current Outpatient Medications on File Prior to Visit  Medication Sig Dispense Refill  . acetaminophen (TYLENOL) 500 MG tablet Take by mouth.    [name] Kitchen acyclovir (ZOVIRAX) 400 MG tablet TAKE 1 TABLET BY MOUTH EVERY DAY 30 tablet 5  . Ascorbic Acid (VITAMIN C) 1000 MG tablet Take 1,000 mg by mouth daily.    [name] Kitchen aspirin 81 MG tablet Take 81 mg by mouth every other day.    [name] Kitchen azelastine (ASTELIN) 0.1 % nasal spray USE 1 SPRAY EACH NOSTRIL TWICE A DAY AS NEEDED FOR ALLERGIES    . fexofenadine (ALLEGRA) 180 MG tablet TAKE 1 TABLET BY MOUTH  EVERY DAY 30 tablet 3  . fluticasone (FLONASE) 50 MCG/ACT nasal spray PLACE 1 SPRAY INTO BOTH NOSTRILS DAILY. 16 mL 2  . gabapentin (NEURONTIN) 100 MG capsule Take two tablet in the morning and two tablets midday PO. 120 capsule 2  . gabapentin (NEURONTIN) 300 MG capsule Take 1 capsule (300 mg total) by mouth at bedtime. 90 capsule 3  . hydrochlorothiazide (MICROZIDE) 12.5 MG capsule TAKE 1 CAPSULE BY MOUTH EVERY DAY 30 capsule 2  . levothyroxine (SYNTHROID) 50 MCG tablet TAKE 1 TABLET BY MOUTH EVERY DAY 30 tablet 5  . losartan (COZAAR) 100 MG tablet Take 1 tablet (100 mg total) by mouth at  bedtime. 90 tablet 2  . Multiple Vitamins-Minerals (BARIATRIC MULTIVITAMINS/IRON PO) Take by mouth.    [name] Kitchen omeprazole (PRILOSEC) 20 MG capsule Take 1 capsule (20 mg total) by mouth 2 (two) times daily before a meal. (Patient taking differently: Take 20 mg by mouth every morning.) 120 capsule 1  . Propylene Glycol (SYSTANE BALANCE) 0.6 % SOLN Apply to eye.    . traZODone (DESYREL) 100 MG tablet TAKE 1 TABLET BY MOUTH EVERYDAY AT BEDTIME 90 tablet 2  . hydrocortisone (ANUSOL-HC) 25 MG suppository Place 1 suppository (25 mg total) rectally 2 (two) times daily as needed for hemorrhoids or anal itching. (Patient not taking: Reported on 04/30/2020) 12 suppository 2   No current facility-administered medications on file prior to visit.    Social History   Tobacco Use  . Smoking status: Former Smoker    Packs/day: 1.50    Years: 24.00    Pack years: 36.00    Types: Cigarettes    Quit date: 1993    Years since quitting: 29.3  . Smokeless tobacco: Never Used  . Tobacco comment: quit 1995.   Vaping Use  . Vaping Use: Never used  Substance Use Topics  . Alcohol use: Not Currently  . Drug use: No    Review of Systems  Constitutional: Negative for chills and fever.  HENT: Positive for congestion, hearing loss (right) and tinnitus. Negative for ear discharge, ear pain and facial swelling.   Respiratory: Negative for cough.   Cardiovascular: Negative for chest pain and palpitations.  Gastrointestinal: Positive for nausea. Negative for abdominal pain, constipation, diarrhea and vomiting.  Neurological: Negative for dizziness and headaches (resolved).      Objective:    BP 120/78   Pulse 67   Temp 98.3 F (36.8 C)   Ht 5' 0.94" (1.548 m)   Wt 179 lb 3.2 oz (81.3 kg)   SpO2 98%   BMI 33.93 kg/m  BP Readings from Last 3 Encounters:  04/30/20 120/78  03/30/20 130/70  03/10/20 (!) 148/66   Wt Readings from Last 3 Encounters:  04/30/20 179 lb 3.2 oz (81.3 kg)  03/30/20 182 lb 3.2 oz  (82.6 kg)  03/08/20 182 lb (82.6 kg)    Physical Exam Vitals reviewed.  Constitutional:      Appearance: Normal appearance. She is well-developed.  HENT:     Head: Normocephalic and atraumatic.     Right Ear: Hearing, tympanic membrane, ear canal and external ear normal. No decreased hearing noted. No drainage, swelling or tenderness. No middle ear effusion. No foreign body. Tympanic membrane is not erythematous or bulging.     Left Ear: Hearing, tympanic membrane, ear canal and external ear normal. No decreased hearing noted. No drainage, swelling or tenderness.  No middle ear effusion. No foreign body. Tympanic membrane is not erythematous or bulging.  Nose: Nose normal. No rhinorrhea.     Right Sinus: No maxillary sinus tenderness or frontal sinus tenderness.     Left Sinus: No maxillary sinus tenderness or frontal sinus tenderness.     Mouth/Throat:     Pharynx: Uvula midline. No oropharyngeal exudate or posterior oropharyngeal erythema.     Tonsils: No tonsillar abscesses.  Eyes:     Conjunctiva/sclera: Conjunctivae normal.  Cardiovascular:     Rate and Rhythm: Normal rate and regular rhythm.     Pulses: Normal pulses.     Heart sounds: Normal heart sounds.  Pulmonary:     Effort: Pulmonary effort is normal.     Breath sounds: Normal breath sounds. No wheezing, rhonchi or rales.  Abdominal:     General: Bowel sounds are normal. There is no distension.     Palpations: Abdomen is soft. Abdomen is not rigid. There is no fluid wave or mass.     Tenderness: There is no abdominal tenderness. There is no guarding or rebound.  Lymphadenopathy:     Head:     Right side of head: No submental, submandibular, tonsillar, preauricular, posterior auricular or occipital adenopathy.     Left side of head: No submental, submandibular, tonsillar, preauricular, posterior auricular or occipital adenopathy.     Cervical: No cervical adenopathy.  Skin:    General: Skin is warm and dry.   Neurological:     Mental Status: She is alert.  Psychiatric:        Speech: Speech normal.        Behavior: Behavior normal.        Thought Content: Thought content normal.        Assessment & Plan:   Problem List Items Addressed This Visit      Cardiovascular and Mediastinum   Essential hypertension    Controlled. Continue HCTZ 12.5mg , amlodipine 5mg  qd, losartan 100mg  qhs.         Respiratory   OSA (obstructive sleep apnea)    Compliant with cipap. Suspect in part reason for HA resolution.         Digestive   GERD (gastroesophageal reflux disease) - Primary    Supoptimal and discussed nausea likely to be symptom of GERD. Start pepcid ac 20mg  qhs. Continue prilosec 20 mg bid.       Relevant Medications   famotidine (PEPCID) 20 MG tablet     Other   Bilateral tinnitus    Acute on chronic. Discussed lifestyle modifications including sound machine. Discussed distraction throughout the day. She has upcoming appointment with Jennifer [name] and advised her to discuss if whether repeat audiology evaluation warranted.           I am having [name] Moscato start on famotidine. I am also having her maintain her aspirin, hydrocortisone, azelastine, acetaminophen, vitamin C, Multiple Vitamins-Minerals (BARIATRIC MULTIVITAMINS/IRON PO), Systane Balance, losartan, fexofenadine, traZODone, omeprazole, gabapentin, gabapentin, fluticasone, acyclovir, levothyroxine, and hydrochlorothiazide.   Meds ordered this encounter  Medications  . famotidine (PEPCID) 20 MG tablet    Sig: Take 1 tablet (20 mg total) by mouth at bedtime.    Dispense:  90 tablet    Refill:  1    Order Specific Question:   Supervising Provider    Answer:   Crecencio Mc [[id number]]    Return precautions given.   Risks, benefits, and alternatives of the medications and treatment plan prescribed today were discussed, and patient expressed understanding.   Education regarding symptom management and diagnosis  given to  patient on AVS.  Continue to follow with [name], FNP for routine health maintenance.   Jennifer [name] and I agreed with plan.   [name], FNP

## 2020-04-30 NOTE — Assessment & Plan Note (Signed)
Acute on chronic. Discussed lifestyle modifications including sound machine. Discussed distraction throughout the day. She has upcoming appointment with Dr Pryor Ochoa and advised her to discuss if whether repeat audiology evaluation warranted.

## 2020-04-30 NOTE — Assessment & Plan Note (Signed)
Compliant with cipap. Suspect in part reason for HA resolution.

## 2020-04-30 NOTE — Assessment & Plan Note (Signed)
Controlled. Continue HCTZ 12.5mg , amlodipine 5mg  qd, losartan 100mg  qhs.

## 2020-04-30 NOTE — Telephone Encounter (Signed)
Jennifer [name], what is status of neurology and GI referral ( to [name]) ? I dont see scheduled Thank you

## 2020-05-04 ENCOUNTER — Ambulatory Visit: Payer: Medicare Other | Admitting: Family

## 2020-05-04 ENCOUNTER — Ambulatory Visit (INDEPENDENT_AMBULATORY_CARE_PROVIDER_SITE_OTHER): Payer: Medicare Other | Admitting: Vascular Surgery

## 2020-05-05 DIAGNOSIS — H903 Sensorineural hearing loss, bilateral: Secondary | ICD-10-CM | POA: Diagnosis not present

## 2020-05-05 DIAGNOSIS — H9313 Tinnitus, bilateral: Secondary | ICD-10-CM | POA: Diagnosis not present

## 2020-05-05 DIAGNOSIS — H8021 Cochlear otosclerosis, right ear: Secondary | ICD-10-CM | POA: Diagnosis not present

## 2020-05-05 DIAGNOSIS — H908 Mixed conductive and sensorineural hearing loss, unspecified: Secondary | ICD-10-CM | POA: Diagnosis not present

## 2020-05-05 DIAGNOSIS — J301 Allergic rhinitis due to pollen: Secondary | ICD-10-CM | POA: Diagnosis not present

## 2020-05-06 ENCOUNTER — Encounter: Payer: Self-pay | Admitting: *Deleted

## 2020-05-06 ENCOUNTER — Telehealth: Payer: Self-pay | Admitting: Family

## 2020-05-06 NOTE — Telephone Encounter (Signed)
Call sandy at [facility] [phone]   She asked if pt could stop hctz 12.5mg  BID and start dyazide ( triamterene/hctz).   Yes that is okay and please ask her which dose she is starting?   Please dc hctz and add dyazide.  Please order and sch bmp lab 7 days after she starts dyazide  Let pt know all

## 2020-05-06 NOTE — Telephone Encounter (Signed)
LM for Sandy at ENT to call back on their clinical line.

## 2020-05-06 NOTE — Telephone Encounter (Signed)
LM once again for [facility] to call back.

## 2020-05-06 NOTE — Telephone Encounter (Signed)
LM once again for [facility] at ENT to call back.

## 2020-05-06 NOTE — Telephone Encounter (Signed)
[place] [facility] called in about fax for patient

## 2020-05-07 ENCOUNTER — Telehealth: Payer: Self-pay

## 2020-05-07 ENCOUNTER — Other Ambulatory Visit: Payer: Self-pay

## 2020-05-07 DIAGNOSIS — I10 Essential (primary) hypertension: Secondary | ICD-10-CM

## 2020-05-07 MED ORDER — TRIAMTERENE-HCTZ 37.5-25 MG PO CAPS: 1.0000 | ORAL_CAPSULE | Freq: Every day | ORAL | 1 refills | Status: DC

## 2020-05-07 NOTE — Telephone Encounter (Addendum)
Pt called and scheduled for BMP. She will d/c plain HCTZ as well.  She has two questions:  1) If gabapentin could be causing her stools to be more loose. Not watery, just loose. This has been happening for quite sometime & read this could be a side effect.  2) The ringing in her ears has been addressed with Dr. Pryor Ochoa, but he didn't say much in the fact it is waking her up. She isn't getting restful sleep. Trazodone helps, but she doesn't stay asleep 7 is woken up by the ringing. Any suggestions? I can bring her back for visit if warranted & let her know it may be?

## 2020-05-07 NOTE — Telephone Encounter (Signed)
I have sent in Dyazide & d/c Maxide. I have ordered BMP as well. I asked that patient call back so that I can go over with her & get her scheduled in ONE week after starting medication for BMP.

## 2020-05-07 NOTE — Telephone Encounter (Signed)
Sandy called back from ENT & stated that patient is taking Dyazide 37.5/25.

## 2020-05-08 ENCOUNTER — Telehealth: Payer: Self-pay | Admitting: Family

## 2020-05-08 ENCOUNTER — Other Ambulatory Visit: Payer: Self-pay

## 2020-05-08 MED ORDER — TRIAMTERENE-HCTZ 37.5-25 MG PO CAPS: 1.0000 | ORAL_CAPSULE | Freq: Every day | ORAL | 1 refills | Status: DC

## 2020-05-08 NOTE — Addendum Note (Signed)
Addended by: Adair Laundry on: 05/08/2020 09:07 AM   Modules accepted: Orders

## 2020-05-08 NOTE — Telephone Encounter (Signed)
Please removed Pharmacy listed and changed to [place], [place], [place]. Patient needs office to resend triamterene-hydrochlorothiazide (DYAZIDE) 37.5-25 MG capsule to [facility], [place], across the [address] from [employer].

## 2020-05-08 NOTE — Telephone Encounter (Signed)
Medication has been sent to correct pharmacy. 

## 2020-05-08 NOTE — Telephone Encounter (Signed)
I have taken out ALL pharmacy but the [place] off [facility] for patient.  Since she has picked up from [facility] already & I sent to [facility] I have called them to have them place on hold for next refill. Pt aware of this to call [facility] when refill is needed.

## 2020-05-08 NOTE — Telephone Encounter (Signed)
PT needs triamterene-hydrochlorothiazide (DYAZIDE) 37.5-25 MG capsule needs to go [facility] on file.

## 2020-05-11 ENCOUNTER — Other Ambulatory Visit: Payer: Self-pay

## 2020-05-11 ENCOUNTER — Ambulatory Visit (INDEPENDENT_AMBULATORY_CARE_PROVIDER_SITE_OTHER): Payer: Medicare Other | Admitting: Vascular Surgery

## 2020-05-11 ENCOUNTER — Encounter (INDEPENDENT_AMBULATORY_CARE_PROVIDER_SITE_OTHER): Payer: Self-pay | Admitting: Vascular Surgery

## 2020-05-11 VITALS — BP 168/80 | HR 71 | Resp 16 | Wt 179.6 lb

## 2020-05-11 DIAGNOSIS — I890 Lymphedema, not elsewhere classified: Secondary | ICD-10-CM | POA: Diagnosis not present

## 2020-05-11 DIAGNOSIS — E782 Mixed hyperlipidemia: Secondary | ICD-10-CM

## 2020-05-11 DIAGNOSIS — I10 Essential (primary) hypertension: Secondary | ICD-10-CM | POA: Diagnosis not present

## 2020-05-11 DIAGNOSIS — M5126 Other intervertebral disc displacement, lumbar region: Secondary | ICD-10-CM

## 2020-05-11 DIAGNOSIS — I872 Venous insufficiency (chronic) (peripheral): Secondary | ICD-10-CM | POA: Diagnosis not present

## 2020-05-11 NOTE — Telephone Encounter (Signed)
FYI Pt stated that maybe this is a new normal for her after surgery bc she was so used to staying constipated before. She said that she does not ever have watery diarrhea stool is more frequent & soft. She did say she saw Dr. Ronalee Belts today who suggested that maybe pain in feet & legs is coming from her arthritis in her back. He suggested ortho & having some xrays done. She scheduled an appointment with you to discuss.

## 2020-05-11 NOTE — Telephone Encounter (Signed)
Call pt  Gabapentin doesn't normally cause loose stools  Please make a visit to discuss all

## 2020-05-11 NOTE — Progress Notes (Signed)
MRN : [id number]  Jennifer [name] is a 70 y.o. ([birth year]) female who presents with chief complaint of No chief complaint on file. [name] Kitchen  History of Present Illness:   The patient returns to the office for followup evaluation regarding leg swelling. The swelling has persisted and the pain associated with swelling continues. There have not been any interval development of a ulcerations or wounds.  Since the previous visit the patient has been wearing graduated compression stockings and has noted improvement in the lymphedema. The patient has been using compression routinely morning until night. She now has a pump and has been using it and it seems to be helping.  She continues to have "pain in the legs that makes it hard to walk".  This has been a similar problem on previous visits as well.  Today she is pointing to the posterior ankle over the mid Achilles.  Furthermore, she describes it as the sensation that the bottom of her feet fill with fluid and this throws her balance off and she has a hard time walking.  The patient also states elevation during the day and exercise is being done too.  No outpatient medications have been marked as taking for the 05/11/20 encounter (Appointment) with Delana Meyer, Dolores Lory, MD.    Past Medical History:  Diagnosis Date  . Anxiety   . Bariatric surgery status   . Constipation   . Dysrhythmia   . Elevated liver enzymes   . GERD (gastroesophageal reflux disease)   . Hemorrhoids   . Herpes genitalis   . High cholesterol   . Hyperlipidemia   . Hypertension   . Hypothyroidism   . Neuropathy   . Osteoarthritis   . Sleep apnea     Past Surgical History:  Procedure Laterality Date  . ABDOMINAL HYSTERECTOMY     total for fibroids no h/o abnormal pap  . bariatric sleeve  2015  . BREAST EXCISIONAL BIOPSY Left 1998  . carpal tunnel repair    . COLONOSCOPY WITH PROPOFOL N/A 02/11/2016   Procedure: COLONOSCOPY WITH PROPOFOL;  Surgeon: Jonathon Bellows,  MD;  Location: [facility] ENDOSCOPY;  Service: Endoscopy;  Laterality: N/A;  . COLONOSCOPY WITH PROPOFOL N/A 10/01/2019   Procedure: COLONOSCOPY WITH PROPOFOL;  Surgeon: Jonathon Bellows, MD;  Location: [facility] ENDOSCOPY;  Service: Gastroenterology;  Laterality: N/A;  . ESOPHAGOGASTRODUODENOSCOPY (EGD) WITH PROPOFOL N/A 12/02/2019   Procedure: ESOPHAGOGASTRODUODENOSCOPY (EGD) WITH PROPOFOL;  Surgeon: Jonathon Bellows, MD;  Location: [facility] ENDOSCOPY;  Service: Gastroenterology;  Laterality: N/A;  . HEMORRHOID SURGERY      Social History Social History   Tobacco Use  . Smoking status: Former Smoker    Packs/day: 1.50    Years: 24.00    Pack years: 36.00    Types: Cigarettes    Quit date: 1993    Years since quitting: 29.3  . Smokeless tobacco: Never Used  . Tobacco comment: quit 1995.   Vaping Use  . Vaping Use: Never used  Substance Use Topics  . Alcohol use: Not Currently  . Drug use: No    Family History Family History  Problem Relation Age of Onset  . Breast cancer Sister 9       materal 1/2 sister  . Hypertension Sister   . Hypertension Mother   . Heart disease Father   . Hypertension Brother     Allergies  Allergen Reactions  . Celecoxib Hives  . Pollen Extract   . Nasacort [Triamcinolone] Other (See Comments)    Nasal -  Nose Bleeds  . Vicodin [Hydrocodone-Acetaminophen] Nausea Only     REVIEW OF SYSTEMS (Negative unless checked)  Constitutional: [] Weight loss  [] Fever  [] Chills Cardiac: [] Chest pain   [] Chest pressure   [] Palpitations   [] Shortness of breath when laying flat   [] Shortness of breath with exertion. Vascular:  [] Pain in legs with walking   [x] Pain in legs at rest  [] History of DVT   [] Phlebitis   [x] Swelling in legs   [] Varicose veins   [] Non-healing ulcers Pulmonary:   [] Uses home oxygen   [] Productive cough   [] Hemoptysis   [] Wheeze  [] COPD   [] Asthma Neurologic:  [] Dizziness   [] Seizures   [] History of stroke   [] History of TIA  [] Aphasia   [] Vissual changes    [] Weakness or numbness in arm   [] Weakness or numbness in leg Musculoskeletal:   [] Joint swelling   [x] Joint pain   [x] Low back pain Hematologic:  [] Easy bruising  [] Easy bleeding   [] Hypercoagulable state   [] Anemic Gastrointestinal:  [] Diarrhea   [] Vomiting  [] Gastroesophageal reflux/heartburn   [] Difficulty swallowing. Genitourinary:  [] Chronic kidney disease   [] Difficult urination  [] Frequent urination   [] Blood in urine Skin:  [] Rashes   [] Ulcers  Psychological:  [] History of anxiety   []  History of major depression.  Physical Examination  There were no vitals filed for this visit. There is no height or weight on file to calculate BMI. Gen: WD/WN, NAD Head: [place]/AT, No temporalis wasting.  Ear/Nose/Throat: Hearing grossly intact, nares w/o erythema or drainage Eyes: PER, EOMI, sclera nonicteric.  Neck: Supple, no large masses.   Pulmonary:  Good air movement, no audible wheezing bilaterally, no use of accessory muscles.  Cardiac: RRR, no JVD Vascular: scattered varicosities present bilaterally.  Mild venous stasis changes to the legs bilaterally.  2+ soft pitting edema Vessel Right Left  Radial Palpable Palpable  PT Palpable Palpable  DP Palpable Palpable  Gastrointestinal: Non-distended. No guarding/no peritoneal signs.  Musculoskeletal: M/S 5/5 throughout.  No deformity or atrophy.  Neurologic: CN 2-12 intact. Symmetrical.  Speech is fluent. Motor exam as listed above. Psychiatric: Judgment intact, Mood & affect appropriate for pt's clinical situation. Dermatologic: Venous rashes no ulcers noted.  No changes consistent with cellulitis. Lymph : No lichenification or skin changes of chronic lymphedema.  CBC Lab Results  Component Value Date   WBC 6.8 03/10/2020   HGB 12.4 03/10/2020   HCT 38.9 03/10/2020   MCV 90.5 03/10/2020   PLT 179 03/10/2020    BMET    Component Value Date/Time   NA 141 03/30/2020 1225   NA 139 06/26/2013 0409   K 4.0 03/30/2020 1225   K 4.3  06/26/2013 0409   CL 103 03/30/2020 1225   CL 106 06/26/2013 0409   CO2 30 03/30/2020 1225   CO2 28 06/26/2013 0409   GLUCOSE 86 03/30/2020 1225   GLUCOSE 108 (H) 06/26/2013 0409   BUN 11 03/30/2020 1225   BUN 12 06/26/2013 0409   CREATININE 0.68 03/30/2020 1225   CREATININE 0.72 06/26/2013 0409   CALCIUM 9.5 03/30/2020 1225   CALCIUM 8.5 06/26/2013 0409   GFRNONAA >60 03/10/2020 1027   GFRNONAA >60 06/26/2013 0409   GFRAA >60 05/24/2019 1420   GFRAA >60 06/26/2013 0409   CrCl cannot be calculated (Patient's most recent lab result is older than the maximum 21 days allowed.).  COAG No results found for: INR, PROTIME  Radiology US ABDOMEN LIMITED RUQ (LIVER/GB)  Result Date: 04/16/2020 CLINICAL DATA:  Elevated liver  function tests EXAM: ULTRASOUND ABDOMEN LIMITED RIGHT UPPER QUADRANT COMPARISON:  CT abdomen pelvis 05/24/2019 FINDINGS: Gallbladder: Minimal sludge seen within the gallbladder lumen. No gallstones or wall thickening visualized. No sonographic Murphy sign noted by sonographer. Common bile duct: Diameter: 3 mm Liver: No focal lesion. Diffusely increased parenchymal echogenicity. Portal vein is patent on color Doppler imaging with normal direction of blood flow towards the liver. Other: None. IMPRESSION: 1. Diffuse increased echogenicity of the hepatic parenchyma is a nonspecific indicator of hepatocellular dysfunction, most commonly steatosis. 2. Minimal gallbladder sludge without evidence of acute cholecystitis. Electronically Signed   By: Miachel Roux M.D.   On: 04/16/2020 12:11     Assessment/Plan 1. Lymphedema No surgery or intervention at this point in time.    I have informed her that edema nad venous insufficiency do not cause severe pain that prevents a person from walking.  I have asked her to follow up with her primary for further workup perhaps of the LS spine.  I have reviewed my discussion with the patient regarding lymphedema and why it  causes symptoms.   Patient will continue wearing graduated compression stockings class 1 (20-30 mmHg) on a daily basis a prescription was given. The patient is reminded to put the stockings on first thing in the morning and removing them in the evening. The patient is instructed specifically not to sleep in the stockings.   In addition, behavioral modification throughout the day will be continued.  This will include frequent elevation (such as in a recliner), use of over the counter pain medications as needed and exercise such as walking.  I have reviewed systemic causes for chronic edema such as liver, kidney and cardiac etiologies and there does not appear to be any significant changes in these organ systems over the past year.  The patient is under the impression that these organ systems are all stable and unchanged.    The patient will continue aggressive use of the  lymph pump.  This will continue to improve the edema control and prevent sequela such as ulcers and infections.   The patient will follow-up with me on an annual basis.   2. Chronic venous insufficiency No surgery or intervention at this point in time.    I have informed her that edema nad venous insufficiency do not cause severe pain that prevents a person from walking.  I have asked her to follow up with her primary for further workup perhaps of the LS spine.  I have reviewed my discussion with the patient regarding lymphedema and why it  causes symptoms.  Patient will continue wearing graduated compression stockings class 1 (20-30 mmHg) on a daily basis a prescription was given. The patient is reminded to put the stockings on first thing in the morning and removing them in the evening. The patient is instructed specifically not to sleep in the stockings.   In addition, behavioral modification throughout the day will be continued.  This will include frequent elevation (such as in a recliner), use of over the counter pain medications as needed  and exercise such as walking.  I have reviewed systemic causes for chronic edema such as liver, kidney and cardiac etiologies and there does not appear to be any significant changes in these organ systems over the past year.  The patient is under the impression that these organ systems are all stable and unchanged.    The patient will continue aggressive use of the  lymph pump.  This will continue  to improve the edema control and prevent sequela such as ulcers and infections.   The patient will follow-up with me on an annual basis.   3. Essential hypertension Continue antihypertensive medications as already ordered, these medications have been reviewed and there are no changes at this time.   4. Mixed hyperlipidemia Continue statin as ordered and reviewed, no changes at this time   5. Displacement of lumbar intervertebral disc without myelopathy Continue NSAID medications as already ordered, these medications have been reviewed and there are no changes at this time.  Continued activity and therapy was stressed.     Hortencia Pilar, MD  05/11/2020 1:09 PM

## 2020-05-15 ENCOUNTER — Other Ambulatory Visit: Payer: Self-pay

## 2020-05-15 ENCOUNTER — Other Ambulatory Visit (INDEPENDENT_AMBULATORY_CARE_PROVIDER_SITE_OTHER): Payer: Medicare Other

## 2020-05-15 DIAGNOSIS — E7800 Pure hypercholesterolemia, unspecified: Secondary | ICD-10-CM

## 2020-05-15 DIAGNOSIS — I10 Essential (primary) hypertension: Secondary | ICD-10-CM | POA: Diagnosis not present

## 2020-05-15 LAB — COMPREHENSIVE METABOLIC PANEL
ALT: 24 U/L (ref 0–35)
AST: 39 U/L — ABNORMAL HIGH (ref 0–37)
Albumin: 4.6 g/dL (ref 3.5–5.2)
Alkaline Phosphatase: 66 U/L (ref 39–117)
BUN: 17 mg/dL (ref 6–23)
CO2: 30 mEq/L (ref 19–32)
Calcium: 9.7 mg/dL (ref 8.4–10.5)
Chloride: 104 mEq/L (ref 96–112)
Creatinine, Ser: 0.98 mg/dL (ref 0.40–1.20)
GFR: 58.38 mL/min — ABNORMAL LOW (ref >60.00)
Glucose, Bld: 98 mg/dL (ref 70–99)
Potassium: 4.3 mEq/L (ref 3.5–5.1)
Sodium: 141 mEq/L (ref 135–145)
Total Bilirubin: 0.5 mg/dL (ref 0.2–1.2)
Total Protein: 6.8 g/dL (ref 6.0–8.3)

## 2020-05-18 ENCOUNTER — Ambulatory Visit
Admission: RE | Admit: 2020-05-18 | Discharge: 2020-05-18 | Disposition: A | Discharge status: Home / Self Care | Payer: Medicare Other | Source: Ambulatory Visit | Attending: Adult Health | Admitting: Adult Health

## 2020-05-18 ENCOUNTER — Encounter: Payer: Self-pay | Admitting: Adult Health

## 2020-05-18 ENCOUNTER — Other Ambulatory Visit: Payer: Self-pay

## 2020-05-18 ENCOUNTER — Ambulatory Visit: Payer: Medicare Other | Admitting: Family

## 2020-05-18 ENCOUNTER — Ambulatory Visit (INDEPENDENT_AMBULATORY_CARE_PROVIDER_SITE_OTHER): Payer: Medicare Other | Admitting: Adult Health

## 2020-05-18 VITALS — BP 130/74 | HR 74 | Temp 96.4°F | Ht 60.95 in | Wt 178.4 lb

## 2020-05-18 DIAGNOSIS — M545 Low back pain, unspecified: Secondary | ICD-10-CM | POA: Diagnosis not present

## 2020-05-18 DIAGNOSIS — M542 Cervicalgia: Secondary | ICD-10-CM | POA: Insufficient documentation

## 2020-05-18 DIAGNOSIS — M62838 Other muscle spasm: Secondary | ICD-10-CM | POA: Diagnosis not present

## 2020-05-18 DIAGNOSIS — M546 Pain in thoracic spine: Secondary | ICD-10-CM | POA: Insufficient documentation

## 2020-05-18 MED ORDER — CYCLOBENZAPRINE HCL 5 MG PO TABS: 5.0000 mg | ORAL_TABLET | Freq: Every day | ORAL | 0 refills | Status: DC

## 2020-05-18 NOTE — Progress Notes (Signed)
Acute Office Visit  Subjective:    Patient ID: Jennifer [name], female    DOB: 17-Sep-1950, 70 y.o.   MRN: [id number]  Chief Complaint  Patient presents with  . Referral    Pt wants a referral for back pain. Pt states pain travels to lower legs starting from her neck. X1-2years  . Medication Question    Pt asks if she should be taking omeprazole and Pepcid    HPI Patient is in today for chronic pain.  She denies any urinary symptoms. She has lower back pain and neck pain. She reports some pain in upper thighs has occasional sciatica on right leg. Taking tylenol and ice hot patches- helps some.   She has an orthopedic.Dr. Cindie Laroche and [facility]. She just needs to call and schedule an appointment- she saw them last 4 months ago. She reports. She does endorse some neck pain she reports in chronic and neck tightness./ Denies any injury or trauma.  She has had an x ray years ago would like to repeat.    Patient  denies any fever, body aches,chills, rash, chest pain, shortness of breath, nausea, vomiting, or diarrhea.  Denies dizziness, lightheadedness, pre syncopal or syncopal episodes.   Past Medical History:  Diagnosis Date  . Anxiety   . Bariatric surgery status   . Constipation   . Dysrhythmia   . Elevated liver enzymes   . GERD (gastroesophageal reflux disease)   . Hemorrhoids   . Herpes genitalis   . High cholesterol   . Hyperlipidemia   . Hypertension   . Hypothyroidism   . Neuropathy   . Osteoarthritis   . Sleep apnea     Past Surgical History:  Procedure Laterality Date  . ABDOMINAL HYSTERECTOMY     total for fibroids no h/o abnormal pap  . bariatric sleeve  2015  . BREAST EXCISIONAL BIOPSY Left 1998  . carpal tunnel repair    . COLONOSCOPY WITH PROPOFOL N/A 02/11/2016   Procedure: COLONOSCOPY WITH PROPOFOL;  Surgeon: Jonathon Bellows, MD;  Location: [facility] ENDOSCOPY;  Service: Endoscopy;  Laterality: N/A;  . COLONOSCOPY WITH PROPOFOL N/A 10/01/2019   Procedure:  COLONOSCOPY WITH PROPOFOL;  Surgeon: Jonathon Bellows, MD;  Location: [facility] ENDOSCOPY;  Service: Gastroenterology;  Laterality: N/A;  . ESOPHAGOGASTRODUODENOSCOPY (EGD) WITH PROPOFOL N/A 12/02/2019   Procedure: ESOPHAGOGASTRODUODENOSCOPY (EGD) WITH PROPOFOL;  Surgeon: Jonathon Bellows, MD;  Location: [facility] ENDOSCOPY;  Service: Gastroenterology;  Laterality: N/A;  . HEMORRHOID SURGERY      Family History  Problem Relation Age of Onset  . Breast cancer Sister 23       materal 1/2 sister  . Hypertension Sister   . Hypertension Mother   . Heart disease Father   . Hypertension Brother     Social History   Socioeconomic History  . Marital status: Married    Spouse name: Not on file  . Number of children: Not on file  . Years of education: Not on file  . Highest education level: Not on file  Occupational History  . Not on file  Tobacco Use  . Smoking status: Former Smoker    Packs/day: 1.50    Years: 24.00    Pack years: 36.00    Types: Cigarettes    Quit date: 1993    Years since quitting: 29.3  . Smokeless tobacco: Never Used  . Tobacco comment: quit 1995.   Vaping Use  . Vaping Use: Never used  Substance and Sexual Activity  . Alcohol use: Not  Currently  . Drug use: No  . Sexual activity: Not Currently    Birth control/protection: Surgical    Comment: Hysterectomy  Other Topics Concern  . Not on file  Social History Narrative   Lives in [place].    Married.    Retired 2015, Interior and spatial designer.    One son; granddaughter.    Social Determinants of Health   Financial Resource Strain: Low Risk   . Difficulty of Paying Living Expenses: Not hard at all  Food Insecurity: No Food Insecurity  . Worried About Charity fundraiser in the Last Year: Never true  . Ran Out of Food in the Last Year: Never true  Transportation Needs: No Transportation Needs  . Lack of Transportation (Medical): No  . Lack of Transportation (Non-Medical): No  Physical Activity: Insufficiently Active   . Days of Exercise per Week: 3 days  . Minutes of Exercise per Session: 20 min  Stress: No Stress Concern Present  . Feeling of Stress : Not at all  Social Connections: Unknown  . Frequency of Communication with Friends and Family: More than three times a week  . Frequency of Social Gatherings with Friends and Family: More than three times a week  . Attends Religious Services: Not on file  . Active Member of Clubs or Organizations: Not on file  . Attends Archivist Meetings: Not on file  . Marital Status: Not on file  Intimate Partner Violence: Not At Risk  . Fear of Current or Ex-Partner: No  . Emotionally Abused: No  . Physically Abused: No  . Sexually Abused: No    Outpatient Medications Prior to Visit  Medication Sig Dispense Refill  . acetaminophen (TYLENOL) 500 MG tablet Take by mouth.    [name] Kitchen acyclovir (ZOVIRAX) 400 MG tablet TAKE 1 TABLET BY MOUTH EVERY DAY 30 tablet 5  . Ascorbic Acid (VITAMIN C) 1000 MG tablet Take 1,000 mg by mouth daily.    [name] Kitchen aspirin 81 MG tablet Take 81 mg by mouth every other day.    [name] Kitchen azelastine (ASTELIN) 0.1 % nasal spray USE 1 SPRAY EACH NOSTRIL TWICE A DAY AS NEEDED FOR ALLERGIES    . famotidine (PEPCID) 20 MG tablet Take 1 tablet (20 mg total) by mouth at bedtime. 90 tablet 1  . fexofenadine (ALLEGRA) 180 MG tablet TAKE 1 TABLET BY MOUTH EVERY DAY 30 tablet 3  . fluticasone (FLONASE) 50 MCG/ACT nasal spray PLACE 1 SPRAY INTO BOTH NOSTRILS DAILY. 16 mL 2  . gabapentin (NEURONTIN) 100 MG capsule Take two tablet in the morning and two tablets midday PO. 120 capsule 2  . gabapentin (NEURONTIN) 300 MG capsule Take 1 capsule (300 mg total) by mouth at bedtime. 90 capsule 3  . hydrocortisone (ANUSOL-HC) 25 MG suppository Place 1 suppository (25 mg total) rectally 2 (two) times daily as needed for hemorrhoids or anal itching. 12 suppository 2  . levothyroxine (SYNTHROID) 50 MCG tablet TAKE 1 TABLET BY MOUTH EVERY DAY 30 tablet 5  . losartan  (COZAAR) 100 MG tablet Take 1 tablet (100 mg total) by mouth at bedtime. 90 tablet 2  . Multiple Vitamins-Minerals (BARIATRIC MULTIVITAMINS/IRON PO) Take by mouth.    [name] Kitchen omeprazole (PRILOSEC) 20 MG capsule Take 1 capsule (20 mg total) by mouth 2 (two) times daily before a meal. (Patient taking differently: Take 20 mg by mouth every morning.) 120 capsule 1  . Propylene Glycol (SYSTANE BALANCE) 0.6 % SOLN Apply to eye.    [name] Kitchen  traZODone (DESYREL) 100 MG tablet TAKE 1 TABLET BY MOUTH EVERYDAY AT BEDTIME 90 tablet 2  . triamterene-hydrochlorothiazide (DYAZIDE) 37.5-25 MG capsule Take 1 each (1 capsule total) by mouth daily. 90 capsule 1   No facility-administered medications prior to visit.    Allergies  Allergen Reactions  . Celecoxib Hives  . Pollen Extract   . Nasacort [Triamcinolone] Other (See Comments)    Nasal - Nose Bleeds  . Vicodin [Hydrocodone-Acetaminophen] Nausea Only    Review of Systems  Constitutional: Negative.   HENT: Negative.   Respiratory: Negative.   Cardiovascular: Negative.   Gastrointestinal: Negative.   Musculoskeletal: Positive for arthralgias, back pain and neck pain. Negative for gait problem, joint swelling, myalgias and neck stiffness.  Skin: Negative for rash.  Neurological: Positive for numbness (at times in feet and also has right leg sciatica intermittently as well ). Negative for weakness.       Objective:    Physical Exam Vitals and nursing note reviewed.  Constitutional:      Appearance: Normal appearance. She is not ill-appearing.  HENT:     Head: Normocephalic and atraumatic.     Right Ear: External ear normal.     Left Ear: External ear normal.     Nose: Nose normal.     Mouth/Throat:     Mouth: Mucous membranes are moist.  Eyes:     General: No scleral icterus.       Right eye: No discharge.        Left eye: No discharge.     Conjunctiva/sclera: Conjunctivae normal.     Pupils: Pupils are equal, round, and reactive to light.   Cardiovascular:     Rate and Rhythm: Normal rate and regular rhythm.     Pulses: Normal pulses.     Heart sounds: Normal heart sounds. No murmur heard. No friction rub. No gallop.   Pulmonary:     Effort: Pulmonary effort is normal. No respiratory distress.     Breath sounds: Normal breath sounds. No stridor. No wheezing, rhonchi or rales.  Chest:     Chest wall: No tenderness.  Abdominal:     General: Bowel sounds are normal. There is no distension.     Palpations: Abdomen is soft.     Tenderness: There is no abdominal tenderness. There is no guarding.  Genitourinary:    Comments: Deferred.  Musculoskeletal:        General: Normal range of motion.     Right shoulder: Normal.     Right upper arm: Normal.     Left upper arm: Normal.     Cervical back: Normal range of motion and neck supple. Spasms and tenderness present. No rigidity or bony tenderness.     Thoracic back: Normal.     Lumbar back: Spasms and tenderness present. Negative right straight leg raise test and negative left straight leg raise test.     Right lower leg: No edema.     Left lower leg: No edema.  Lymphadenopathy:     Cervical: No cervical adenopathy.  Skin:    General: Skin is warm.     Findings: No erythema, lesion or rash.  Neurological:     Mental Status: She is alert and oriented to person, place, and time.     Motor: No weakness.     Gait: Gait normal.  Psychiatric:        Mood and Affect: Mood normal.        Behavior: Behavior normal.  Thought Content: Thought content normal.        Judgment: Judgment normal.     BP 130/74 (BP Location: Left Arm, Patient Position: Sitting)   Pulse 74   Temp (!) 96.4 F (35.8 C)   Ht 5' 0.95" (1.548 m)   Wt 178 lb 6.4 oz (80.9 kg)   SpO2 98%   BMI 33.77 kg/m  Wt Readings from Last 3 Encounters:  05/18/20 178 lb 6.4 oz (80.9 kg)  05/11/20 179 lb 9.6 oz (81.5 kg)  04/30/20 179 lb 3.2 oz (81.3 kg)    Health Maintenance Due  Topic Date Due  .  OPHTHALMOLOGY EXAM  Never done  . FOOT EXAM  11/28/2018  . COVID-19 Vaccine (3 - Booster for Pfizer series) 10/10/2019    There are no preventive care reminders to display for this patient.   Lab Results  Component Value Date   TSH 1.99 03/10/2020   Lab Results  Component Value Date   WBC 6.8 03/10/2020   HGB 12.4 03/10/2020   HCT 38.9 03/10/2020   MCV 90.5 03/10/2020   PLT 179 03/10/2020   Lab Results  Component Value Date   NA 141 05/15/2020   K 4.3 05/15/2020   CO2 30 05/15/2020   GLUCOSE 98 05/15/2020   BUN 17 05/15/2020   CREATININE 0.98 05/15/2020   BILITOT 0.5 05/15/2020   ALKPHOS 66 05/15/2020   AST 39 (H) 05/15/2020   ALT 24 05/15/2020   PROT 6.8 05/15/2020   ALBUMIN 4.6 05/15/2020   CALCIUM 9.7 05/15/2020   ANIONGAP 9 03/10/2020   GFR 58.38 (L) 05/15/2020   Lab Results  Component Value Date   CHOL 174 03/10/2020   Lab Results  Component Value Date   HDL 52.90 03/10/2020   Lab Results  Component Value Date   LDLCALC 98 03/10/2020   Lab Results  Component Value Date   TRIG 114.0 03/10/2020   Lab Results  Component Value Date   CHOLHDL 3 03/10/2020   Lab Results  Component Value Date   HGBA1C 5.4 03/10/2020       Assessment & Plan:   Problem List Items Addressed This Visit      Other   Cervical pain (neck) - Primary   Relevant Orders   DG Cervical Spine Complete   Lumbar back pain   Relevant Medications   cyclobenzaprine (FLEXERIL) 5 MG tablet   Other Relevant Orders   DG Lumbar Spine Complete    Other Visit Diagnoses    Muscle spasm       Relevant Medications   cyclobenzaprine (FLEXERIL) 5 MG tablet    She will call the orthopedics office she is currently established with. She should be able to schedule and appointment. Given low dose flexeril for suspected muscle spasm/ tightness given chronic pain, ok to continue tylenol per package instructions PRN, she can try half tablet 2.5mg  at Q HS. If not helping may increase to whole  tablet as directae. Side effects discussed and caution was advised. If she needs referral for orthopedics she will call our office and ok to place.  X-rays ordered of cervical and lumbar spine at [facility].  Meds ordered this encounter  Medications  . cyclobenzaprine (FLEXERIL) 5 MG tablet    Sig: Take 1 tablet (5 mg total) by mouth at bedtime. Will cause drowsiness.    Dispense:  30 tablet    Refill:  0  Return if symptoms worsen or fail to improve, for at any  time for any worsening symptoms, Go to Emergency room/ urgent care if worse.  Red Flags discussed. The patient was given clear instructions to go to ER or return to medical center if any red flags develop, symptoms do not improve, worsen or new problems develop. They verbalized understanding.  Marcille Buffy, FNP

## 2020-05-18 NOTE — Patient Instructions (Signed)
[email or web address]://[email or web address]">  Chronic Back Pain When back pain lasts longer than 3 months, it is called chronic back pain. The cause of your back pain may not be known. Some common causes include:  Wear and tear (degenerative disease) of the bones, ligaments, or disks in your back.  Inflammation and stiffness in your back (arthritis). People who have chronic back pain often go through certain periods in which the pain is more intense (flare-ups). Many people can learn to manage the pain with home care. Follow these instructions at home: Pay attention to any changes in your symptoms. Take these actions to help with your pain: Managing pain and stiffness  If directed, apply ice to the painful area. Your health care provider may recommend applying ice during the first 24-48 hours after a flare-up begins. To do this: ? Put ice in a plastic bag. ? Place a towel between your skin and the bag. ? Leave the ice on for 20 minutes, 2-3 times per day.  If directed, apply heat to the affected area as often as told by your health care provider. Use the heat source that your health care provider recommends, such as a moist heat pack or a heating pad. ? Place a towel between your skin and the heat source. ? Leave the heat on for 20-30 minutes. ? Remove the heat if your skin turns bright red. This is especially important if you are unable to feel pain, heat, or cold. You may have a greater risk of getting burned.  Try soaking in a warm tub.      Activity  Avoid bending and other activities that make the problem worse.  Maintain a proper position when standing or sitting: ? When standing, keep your upper back and neck straight, with your shoulders pulled back. Avoid slouching. ? When sitting, keep your back straight and relax your shoulders. Do not round your shoulders or pull them backward.  Do not sit or stand in one place for long periods of time.  Take brief periods of rest  throughout the day. This will reduce your pain. Resting in a lying or standing position is usually better than sitting to rest.  When you are resting for longer periods, mix in some mild activity or stretching between periods of rest. This will help to prevent stiffness and pain.  Get regular exercise. Ask your health care provider what activities are safe for you.  Do not lift anything that is heavier than 10 lb (4.5 kg), or the limit that you are told, until your health care provider says that it is safe. Always use proper lifting technique, which includes: ? Bending your knees. ? Keeping the load close to your body. ? Avoiding twisting.  Sleep on a firm mattress in a comfortable position. Try lying on your side with your knees slightly bent. If you lie on your back, put a pillow under your knees.   Medicines  Treatment may include medicines for pain and inflammation taken by mouth or applied to the skin, prescription pain medicine, or muscle relaxants. Take over-the-counter and prescription medicines only as told by your health care provider.  Ask your health care provider if the medicine prescribed to you: ? Requires you to avoid driving or using machinery. ? Can cause constipation. You may need to take these actions to prevent or treat constipation:  Drink enough fluid to keep your urine pale yellow.  Take over-the-counter or prescription medicines.  Eat foods that are high in fiber, such as   beans, whole grains, and fresh fruits and vegetables.  Limit foods that are high in fat and processed sugars, such as fried or sweet foods. General instructions  Do not use any products that contain nicotine or tobacco, such as cigarettes, e-cigarettes, and chewing tobacco. If you need help quitting, ask your health care provider.  Keep all follow-up visits as told by your health care provider. This is important. Contact a health care provider if:  You have pain that is not relieved with  rest or medicine.  Your pain gets worse, or you have new pain.  You have a high fever.  You have rapid weight loss.  You have trouble doing your normal activities. Get help right away if:  You have weakness or numbness in one or both of your legs or feet.  You have trouble controlling your bladder or your bowels.  You have severe back pain and have any of the following: ? Nausea or vomiting. ? Pain in your abdomen. ? Shortness of breath or you faint. Summary  Chronic back pain is back pain that lasts longer than 3 months.  When a flare-up begins, apply ice to the painful area for the first 24-48 hours.  Apply a moist heat pad or use a heating pad on the painful area as directed by your health care provider.  When you are resting for longer periods, mix in some mild activity or stretching between periods of rest. This will help to prevent stiffness and pain. This information is not intended to replace advice given to you by your health care provider. Make sure you discuss any questions you have with your health care provider. Document Revised: 02/13/2019 Document Reviewed: 02/13/2019 Elsevier Patient Education  2021 [place]. Cyclobenzaprine tablets What is this medicine? CYCLOBENZAPRINE (sye kloe BEN za preen) is a muscle relaxer. It is used to treat muscle pain, spasms, and stiffness. This medicine may be used for other purposes; ask your health care provider or pharmacist if you have questions. COMMON BRAND NAME(S): Fexmid, Flexeril What should I tell my health care provider before I take this medicine? They need to know if you have any of these conditions:  heart disease, irregular heartbeat, or previous heart attack  liver disease  thyroid problem  an unusual or allergic reaction to cyclobenzaprine, tricyclic antidepressants, lactose, other medicines, foods, dyes, or preservatives  pregnant or trying to get pregnant  breast-feeding How should I use this  medicine? Take this medicine by mouth with a glass of water. Follow the directions on the prescription label. If this medicine upsets your stomach, take it with food or milk. Take your medicine at regular intervals. Do not take it more often than directed. Talk to your pediatrician regarding the use of this medicine in children. Special care may be needed. Overdosage: If you think you have taken too much of this medicine contact a poison control center or emergency room at once. NOTE: This medicine is only for you. Do not share this medicine with others. What if I miss a dose? If you miss a dose, take it as soon as you can. If it is almost time for your next dose, take only that dose. Do not take double or extra doses. What may interact with this medicine? Do not take this medicine with any of the following medications:  MAOIs like Carbex, Eldepryl, Marplan, Nardil, and Parnate  narcotic medicines for cough  safinamide This medicine may also interact with the following medications:  alcohol  bupropion  antihistamines for allergy, cough and cold  certain medicines for anxiety or sleep  certain medicines for bladder problems like oxybutynin, tolterodine  certain medicines for depression like amitriptyline, fluoxetine, sertraline  certain medicines for Parkinson's disease like benztropine, trihexyphenidyl  certain medicines for seizures like phenobarbital, primidone  certain medicines for stomach problems like dicyclomine, hyoscyamine  certain medicines for travel sickness like scopolamine  general anesthetics like halothane, isoflurane, methoxyflurane, propofol  ipratropium  local anesthetics like lidocaine, pramoxine, tetracaine  medicines that relax muscles for surgery  narcotic medicines for pain  phenothiazines like chlorpromazine, mesoridazine, prochlorperazine, thioridazine  verapamil This list may not describe all possible interactions. Give your health care  provider a list of all the medicines, herbs, non-prescription drugs, or dietary supplements you use. Also tell them if you smoke, drink alcohol, or use illegal drugs. Some items may interact with your medicine. What should I watch for while using this medicine? Tell your doctor or health care professional if your symptoms do not start to get better or if they get worse. You may get drowsy or dizzy. Do not drive, use machinery, or do anything that needs mental alertness until you know how this medicine affects you. Do not stand or sit up quickly, especially if you are an older patient. This reduces the risk of dizzy or fainting spells. Alcohol may interfere with the effect of this medicine. Avoid alcoholic drinks. If you are taking another medicine that also causes drowsiness, you may have more side effects. Give your health care provider a list of all medicines you use. Your doctor will tell you how much medicine to take. Do not take more medicine than directed. Call emergency for help if you have problems breathing or unusual sleepiness. Your mouth may get dry. Chewing sugarless gum or sucking hard candy, and drinking plenty of water may help. Contact your doctor if the problem does not go away or is severe. What side effects may I notice from receiving this medicine? Side effects that you should report to your doctor or health care professional as soon as possible:  allergic reactions like skin rash, itching or hives, swelling of the face, lips, or tongue  breathing problems  chest pain  fast, irregular heartbeat  hallucinations  seizures  unusually weak or tired Side effects that usually do not require medical attention (report to your doctor or health care professional if they continue or are bothersome):  headache  nausea, vomiting This list may not describe all possible side effects. Call your doctor for medical advice about side effects. You may report side effects to FDA at  1-800-FDA-1088. Where should I keep my medicine? Keep out of the reach of children. Store at room temperature between 15 and 30 degrees C (59 and 86 degrees F). Keep container tightly closed. Throw away any unused medicine after the expiration date. NOTE: This sheet is a summary. It may not cover all possible information. If you have questions about this medicine, talk to your doctor, pharmacist, or health care provider.  2021 Elsevier/Gold Standard (2017-12-06 12:49:26)

## 2020-05-19 ENCOUNTER — Other Ambulatory Visit: Payer: Self-pay | Admitting: Adult Health

## 2020-05-19 DIAGNOSIS — K838 Other specified diseases of biliary tract: Secondary | ICD-10-CM | POA: Diagnosis not present

## 2020-05-19 DIAGNOSIS — K909 Intestinal malabsorption, unspecified: Secondary | ICD-10-CM | POA: Diagnosis not present

## 2020-05-19 DIAGNOSIS — G4733 Obstructive sleep apnea (adult) (pediatric): Secondary | ICD-10-CM | POA: Diagnosis not present

## 2020-05-19 DIAGNOSIS — Z6833 Body mass index (BMI) 33.0-33.9, adult: Secondary | ICD-10-CM | POA: Diagnosis not present

## 2020-05-19 DIAGNOSIS — Z903 Acquired absence of stomach [part of]: Secondary | ICD-10-CM | POA: Diagnosis not present

## 2020-05-19 DIAGNOSIS — Z9884 Bariatric surgery status: Secondary | ICD-10-CM | POA: Diagnosis not present

## 2020-05-19 NOTE — Progress Notes (Signed)
Mild disc changes similar to prior lumbar spine x ray.  Mild multilevel degenerative disc changes, similar to prior x  ray.   Recommend follow up if any pain persists at anytime, orthopedics referral ok as well as physical therapy.

## 2020-05-19 NOTE — Progress Notes (Signed)
Flexeril not on formulary.

## 2020-05-19 NOTE — Progress Notes (Signed)
Mild disc changes similar to prior lumbar spine x ray.  Mild multilevel degenerative disc changes, similar to prior x  ray.   Recommend follow up if any pain persists at anytime, orthopedics referral ok as well as physical therapy.

## 2020-05-20 ENCOUNTER — Telehealth: Payer: Self-pay

## 2020-05-20 NOTE — Telephone Encounter (Signed)
Submitted a PA for Cyclobenzaprine HCI 5mg  Tablets.  KEY: [id number]

## 2020-06-02 DIAGNOSIS — Z01818 Encounter for other preprocedural examination: Secondary | ICD-10-CM | POA: Diagnosis not present

## 2020-06-05 DIAGNOSIS — Z8349 Family history of other endocrine, nutritional and metabolic diseases: Secondary | ICD-10-CM | POA: Diagnosis not present

## 2020-06-05 DIAGNOSIS — M1990 Unspecified osteoarthritis, unspecified site: Secondary | ICD-10-CM | POA: Diagnosis not present

## 2020-06-05 DIAGNOSIS — K219 Gastro-esophageal reflux disease without esophagitis: Secondary | ICD-10-CM | POA: Diagnosis not present

## 2020-06-05 DIAGNOSIS — Z9889 Other specified postprocedural states: Secondary | ICD-10-CM | POA: Diagnosis not present

## 2020-06-05 DIAGNOSIS — Z833 Family history of diabetes mellitus: Secondary | ICD-10-CM | POA: Diagnosis not present

## 2020-06-05 DIAGNOSIS — I10 Essential (primary) hypertension: Secondary | ICD-10-CM | POA: Diagnosis not present

## 2020-06-05 DIAGNOSIS — K660 Peritoneal adhesions (postprocedural) (postinfection): Secondary | ICD-10-CM | POA: Diagnosis not present

## 2020-06-05 DIAGNOSIS — Z9884 Bariatric surgery status: Secondary | ICD-10-CM | POA: Diagnosis not present

## 2020-06-05 DIAGNOSIS — I739 Peripheral vascular disease, unspecified: Secondary | ICD-10-CM | POA: Diagnosis not present

## 2020-06-05 DIAGNOSIS — E7800 Pure hypercholesterolemia, unspecified: Secondary | ICD-10-CM | POA: Diagnosis not present

## 2020-06-05 DIAGNOSIS — K760 Fatty (change of) liver, not elsewhere classified: Secondary | ICD-10-CM | POA: Diagnosis not present

## 2020-06-05 DIAGNOSIS — K56609 Unspecified intestinal obstruction, unspecified as to partial versus complete obstruction: Secondary | ICD-10-CM | POA: Diagnosis not present

## 2020-06-05 DIAGNOSIS — Z87891 Personal history of nicotine dependence: Secondary | ICD-10-CM | POA: Diagnosis not present

## 2020-06-05 DIAGNOSIS — K8010 Calculus of gallbladder with chronic cholecystitis without obstruction: Secondary | ICD-10-CM | POA: Diagnosis not present

## 2020-06-05 DIAGNOSIS — K829 Disease of gallbladder, unspecified: Secondary | ICD-10-CM | POA: Diagnosis not present

## 2020-06-05 DIAGNOSIS — E039 Hypothyroidism, unspecified: Secondary | ICD-10-CM | POA: Diagnosis not present

## 2020-06-05 DIAGNOSIS — F419 Anxiety disorder, unspecified: Secondary | ICD-10-CM | POA: Diagnosis not present

## 2020-06-05 DIAGNOSIS — Z885 Allergy status to narcotic agent status: Secondary | ICD-10-CM | POA: Diagnosis not present

## 2020-06-05 DIAGNOSIS — Z8719 Personal history of other diseases of the digestive system: Secondary | ICD-10-CM | POA: Diagnosis not present

## 2020-06-05 DIAGNOSIS — K828 Other specified diseases of gallbladder: Secondary | ICD-10-CM | POA: Diagnosis not present

## 2020-06-05 DIAGNOSIS — Z8261 Family history of arthritis: Secondary | ICD-10-CM | POA: Diagnosis not present

## 2020-06-05 DIAGNOSIS — Z6834 Body mass index (BMI) 34.0-34.9, adult: Secondary | ICD-10-CM | POA: Diagnosis not present

## 2020-06-05 DIAGNOSIS — Z8249 Family history of ischemic heart disease and other diseases of the circulatory system: Secondary | ICD-10-CM | POA: Diagnosis not present

## 2020-06-05 DIAGNOSIS — F32A Depression, unspecified: Secondary | ICD-10-CM | POA: Diagnosis not present

## 2020-06-09 ENCOUNTER — Other Ambulatory Visit: Payer: Self-pay

## 2020-06-09 ENCOUNTER — Encounter: Payer: Self-pay | Admitting: Emergency Medicine

## 2020-06-09 ENCOUNTER — Inpatient Hospital Stay
Admission: EM | Admit: 2020-06-09 | Discharge: 2020-06-12 | DRG: 395 | Disposition: A | Discharge status: Other Acute Inpatient Hospital | Payer: Medicare Other | Attending: Internal Medicine | Admitting: Internal Medicine

## 2020-06-09 ENCOUNTER — Emergency Department: Payer: Medicare Other

## 2020-06-09 DIAGNOSIS — M1990 Unspecified osteoarthritis, unspecified site: Secondary | ICD-10-CM | POA: Diagnosis present

## 2020-06-09 DIAGNOSIS — N3289 Other specified disorders of bladder: Secondary | ICD-10-CM | POA: Diagnosis not present

## 2020-06-09 DIAGNOSIS — I499 Cardiac arrhythmia, unspecified: Secondary | ICD-10-CM | POA: Diagnosis not present

## 2020-06-09 DIAGNOSIS — K219 Gastro-esophageal reflux disease without esophagitis: Secondary | ICD-10-CM | POA: Diagnosis present

## 2020-06-09 DIAGNOSIS — K839 Disease of biliary tract, unspecified: Secondary | ICD-10-CM

## 2020-06-09 DIAGNOSIS — R1011 Right upper quadrant pain: Secondary | ICD-10-CM | POA: Diagnosis not present

## 2020-06-09 DIAGNOSIS — E785 Hyperlipidemia, unspecified: Secondary | ICD-10-CM | POA: Diagnosis present

## 2020-06-09 DIAGNOSIS — E039 Hypothyroidism, unspecified: Secondary | ICD-10-CM | POA: Diagnosis present

## 2020-06-09 DIAGNOSIS — K832 Perforation of bile duct: Secondary | ICD-10-CM | POA: Diagnosis not present

## 2020-06-09 DIAGNOSIS — Z79899 Other long term (current) drug therapy: Secondary | ICD-10-CM

## 2020-06-09 DIAGNOSIS — Z9049 Acquired absence of other specified parts of digestive tract: Secondary | ICD-10-CM | POA: Diagnosis not present

## 2020-06-09 DIAGNOSIS — Z743 Need for continuous supervision: Secondary | ICD-10-CM | POA: Diagnosis not present

## 2020-06-09 DIAGNOSIS — E1142 Type 2 diabetes mellitus with diabetic polyneuropathy: Secondary | ICD-10-CM | POA: Diagnosis present

## 2020-06-09 DIAGNOSIS — Z885 Allergy status to narcotic agent status: Secondary | ICD-10-CM

## 2020-06-09 DIAGNOSIS — Z87891 Personal history of nicotine dependence: Secondary | ICD-10-CM

## 2020-06-09 DIAGNOSIS — Z20822 Contact with and (suspected) exposure to covid-19: Secondary | ICD-10-CM | POA: Diagnosis present

## 2020-06-09 DIAGNOSIS — G4733 Obstructive sleep apnea (adult) (pediatric): Secondary | ICD-10-CM | POA: Diagnosis present

## 2020-06-09 DIAGNOSIS — Y733 Surgical instruments, materials and gastroenterology and urology devices (including sutures) associated with adverse incidents: Secondary | ICD-10-CM | POA: Diagnosis present

## 2020-06-09 DIAGNOSIS — K9189 Other postprocedural complications and disorders of digestive system: Principal | ICD-10-CM | POA: Diagnosis present

## 2020-06-09 DIAGNOSIS — I700 Atherosclerosis of aorta: Secondary | ICD-10-CM | POA: Diagnosis not present

## 2020-06-09 DIAGNOSIS — Z9884 Bariatric surgery status: Secondary | ICD-10-CM

## 2020-06-09 DIAGNOSIS — R1010 Upper abdominal pain, unspecified: Secondary | ICD-10-CM

## 2020-06-09 DIAGNOSIS — Z7982 Long term (current) use of aspirin: Secondary | ICD-10-CM

## 2020-06-09 DIAGNOSIS — R1031 Right lower quadrant pain: Secondary | ICD-10-CM | POA: Diagnosis not present

## 2020-06-09 DIAGNOSIS — Q438 Other specified congenital malformations of intestine: Secondary | ICD-10-CM | POA: Diagnosis not present

## 2020-06-09 DIAGNOSIS — Z886 Allergy status to analgesic agent status: Secondary | ICD-10-CM

## 2020-06-09 DIAGNOSIS — Z888 Allergy status to other drugs, medicaments and biological substances status: Secondary | ICD-10-CM

## 2020-06-09 DIAGNOSIS — R109 Unspecified abdominal pain: Secondary | ICD-10-CM | POA: Diagnosis not present

## 2020-06-09 DIAGNOSIS — R6889 Other general symptoms and signs: Secondary | ICD-10-CM | POA: Diagnosis not present

## 2020-06-09 DIAGNOSIS — Z79890 Hormone replacement therapy (postmenopausal): Secondary | ICD-10-CM

## 2020-06-09 DIAGNOSIS — R110 Nausea: Secondary | ICD-10-CM | POA: Diagnosis not present

## 2020-06-09 DIAGNOSIS — I10 Essential (primary) hypertension: Secondary | ICD-10-CM | POA: Diagnosis present

## 2020-06-09 DIAGNOSIS — R52 Pain, unspecified: Secondary | ICD-10-CM | POA: Diagnosis not present

## 2020-06-09 DIAGNOSIS — K838 Other specified diseases of biliary tract: Secondary | ICD-10-CM

## 2020-06-09 DIAGNOSIS — Z803 Family history of malignant neoplasm of breast: Secondary | ICD-10-CM

## 2020-06-09 DIAGNOSIS — E7800 Pure hypercholesterolemia, unspecified: Secondary | ICD-10-CM | POA: Diagnosis not present

## 2020-06-09 DIAGNOSIS — K760 Fatty (change of) liver, not elsewhere classified: Secondary | ICD-10-CM | POA: Diagnosis present

## 2020-06-09 DIAGNOSIS — Z8249 Family history of ischemic heart disease and other diseases of the circulatory system: Secondary | ICD-10-CM

## 2020-06-09 DIAGNOSIS — Y838 Other surgical procedures as the cause of abnormal reaction of the patient, or of later complication, without mention of misadventure at the time of the procedure: Secondary | ICD-10-CM | POA: Diagnosis present

## 2020-06-09 LAB — COMPREHENSIVE METABOLIC PANEL
ALT: 23 U/L (ref 0–44)
AST: 47 U/L — ABNORMAL HIGH (ref 15–41)
Albumin: 3.9 g/dL (ref 3.5–5.0)
Alkaline Phosphatase: 54 U/L (ref 38–126)
Anion gap: 13 (ref 5–15)
BUN: 12 mg/dL (ref 8–23)
CO2: 23 mmol/L (ref 22–32)
Calcium: 8.5 mg/dL — ABNORMAL LOW (ref 8.9–10.3)
Chloride: 104 mmol/L (ref 98–111)
Creatinine, Ser: 0.84 mg/dL (ref 0.44–1.00)
GFR, Estimated: >60 mL/min (ref >60)
Glucose, Bld: 130 mg/dL — ABNORMAL HIGH (ref 70–99)
Potassium: 5 mmol/L (ref 3.5–5.1)
Sodium: 140 mmol/L (ref 135–145)
Total Bilirubin: 0.9 mg/dL (ref 0.3–1.2)
Total Protein: 6.6 g/dL (ref 6.5–8.1)

## 2020-06-09 LAB — CBC
HCT: 35.8 % — ABNORMAL LOW (ref 36.0–46.0)
Hemoglobin: 11.5 g/dL — ABNORMAL LOW (ref 12.0–15.0)
MCH: 28.2 pg (ref 26.0–34.0)
MCHC: 32.1 g/dL (ref 30.0–36.0)
MCV: 87.7 fL (ref 80.0–100.0)
Platelets: 156 10*3/uL (ref 150–400)
RBC: 4.08 MIL/uL (ref 3.87–5.11)
RDW: 15.2 % (ref 11.5–15.5)
WBC: 11.2 10*3/uL — ABNORMAL HIGH (ref 4.0–10.5)
nRBC: 0 % (ref 0.0–0.2)

## 2020-06-09 LAB — LIPASE, BLOOD: Lipase: 21 U/L (ref 11–51)

## 2020-06-09 MED ORDER — TECHNETIUM TC 99M MEBROFENIN IV KIT
5.0000 | PACK | Freq: Once | INTRAVENOUS | Status: AC | PRN
  Administered 2020-06-09: 5.36 via INTRAVENOUS

## 2020-06-09 MED ORDER — MORPHINE SULFATE (PF) 4 MG/ML IV SOLN
4.0000 mg | Freq: Once | INTRAVENOUS | Status: AC
  Administered 2020-06-09: 4 mg via INTRAVENOUS
  Filled 2020-06-09: qty 1

## 2020-06-09 MED ORDER — MORPHINE SULFATE (PF) 4 MG/ML IV SOLN
4.0000 mg | Freq: Once | INTRAVENOUS | Status: AC
Start: 2020-06-09 — End: 2020-06-09
  Administered 2020-06-09: 4 mg via INTRAVENOUS
  Filled 2020-06-09: qty 1

## 2020-06-09 MED ORDER — METOCLOPRAMIDE HCL 5 MG/ML IJ SOLN
5.0000 mg | Freq: Once | INTRAMUSCULAR | Status: AC
  Administered 2020-06-09: 5 mg via INTRAVENOUS
  Filled 2020-06-09: qty 2

## 2020-06-09 MED ORDER — FENTANYL CITRATE (PF) 100 MCG/2ML IJ SOLN
50.0000 ug | Freq: Once | INTRAMUSCULAR | Status: AC
Start: 2020-06-09 — End: 2020-06-09
  Administered 2020-06-09: 50 ug via INTRAVENOUS
  Filled 2020-06-09: qty 2

## 2020-06-09 NOTE — ED Notes (Signed)
Spoke with [facility] - They will be here in 45 mins to scan patient.

## 2020-06-09 NOTE — ED Provider Notes (Signed)
  Physical Exam  BP (!) 149/66   Pulse 80   Temp 98.5 F (36.9 C)   Resp 17   Ht 5\' 1"  (1.549 m)   Wt 81.6 kg   SpO2 99%   BMI 34.01 kg/m   Physical Exam  ED Course/Procedures     Procedures  MDM  11:55 PM  Assumed care.  Patient is a 70 year old female with recent cholecystectomy 06/05/20 by Dr. Kreg Shropshire at [facility] who presents to the emergency department with increasing abdominal pain.  HIDA scan pending for further evaluation.  HIDA scan shows biliary leak arising from the level of the mid common bile duct with nonvisualization of the distal duct and duodenum.  Patient reports pain is still 7/10.  Will give more pain medication.  She is NPO.  We will start IV fluids.  Will obtain COVID swab.  She would like transfer back to the [facility] system and is declining admission to [facility] system.  1:10 AM  Spoke with Clayborne Dana, PA for Dr. Darnell Level.  Unable to accept patient at this time given there are no beds at [facility]  Will discuss with our general surgeon.  Patient and family are okay with her staying here given no beds available at week.  Patient is status post sleeve gastrectomy, hysterectomy as well.  1:25 AM  Spoke with Dr. Hampton Abbot with general surgery.  He has reviewed patient's imaging.  States fluid collection is very small and patient will likely not need a drain and will likely need ERCP and stent placement.  He has reviewed Dr. Dorothey Baseman schedule who is our local gastroenterologist that is able to do ERCPs and states that he is in town at this time and feels that the patient would likely be able to be admitted to the hospitalist service and see GI in the morning.  Will discuss with GI on-call to ensure that this sounds like a reasonable plan prior to admitting to the hospitalist service.  Surgery will see patient in consultation later in the morning.  1:37 AM  Spoke with Dr. Bonna Gains with GI.  She states Dr. Allen Norris would be available for ERCP on Thursday the 26th and given  patient is afebrile, hemodynamically stable with reassuring labs this sounds like a reasonable plan for her to be admitted to [facility].  She will help coordinate this with Dr. Allen Norris.  Will discuss with hospitalist.  1:55 AM Discussed patient's case with hospitalist, Dr. Sidney Ace.  I have recommended admission and patient (and family if present) agree with this plan. Admitting physician will place admission orders.   I reviewed all nursing notes, vitals, pertinent previous records and reviewed/interpreted all EKGs, lab and urine results, imaging (as available).     Oseph Imburgia, Delice Bison, DO 06/10/20 [id number]

## 2020-06-09 NOTE — ED Triage Notes (Signed)
First Nurse NOte:  Arrives from home via [facility].  C/O N/V since noon.  Had Lap Chole done on Friday at [facility].  4 mg Zofran given.

## 2020-06-09 NOTE — ED Triage Notes (Signed)
Pt to ED via POV with c/o RUQ abd pain that radiates into her R shoulder, pt states started this morning. Pt had recent lap choly done at [place]. Pt also c/o N/V

## 2020-06-09 NOTE — ED Provider Notes (Signed)
[facility] Emergency Department Provider Note  ____________________________________________   Event Date/Time   First MD Initiated Contact with Patient 06/09/20 1847     (approximate)  I have reviewed the triage vital signs and the nursing notes.   HISTORY  Chief Complaint Abdominal Pain and Shoulder Pain    HPI Jennifer [name] is a 70 y.o. female presents emergency department complaining of right upper quadrant and right lower quadrant pain.  Patient had a laparoscopic cholecystectomy on Friday//06/06/2018 at [facility]  Patient did not call her surgeon.  She states that they sent her home with pain medication but she has not been able to Due to the vomiting.  The abdominal pain started this morning.  States pain radiates into the right shoulder.  She denies chest pain/shortness of breath.    Past Medical History:  Diagnosis Date  . Anxiety   . Bariatric surgery status   . Constipation   . Dysrhythmia   . Elevated liver enzymes   . GERD (gastroesophageal reflux disease)   . Hemorrhoids   . Herpes genitalis   . High cholesterol   . Hyperlipidemia   . Hypertension   . Hypothyroidism   . Neuropathy   . Osteoarthritis   . Sleep apnea     Patient Active Problem List   Diagnosis Date Noted  . Lumbar back pain 05/18/2020  . Muscle spasm 05/18/2020  . Bilateral tinnitus 04/30/2020  . Headache 03/30/2020  . Irregular heartbeat 02/25/2020  . Diabetes mellitus without complication ([place]) 79/02/4095  . Aspirin long-term use 10/22/2019  . Wound ballistics 08/29/2019  . Displacement of lumbar intervertebral disc without myelopathy 08/29/2019  . Helicobacter pylori gastrointestinal tract infection 08/29/2019  . Hypercholesterolemia 08/29/2019  . Phlebitis after infusion 06/26/2019  . Superficial thrombophlebitis of left upper extremity 06/26/2019  . BMI 38.0-38.9,adult 06/19/2019  . Small bowel obstruction ([place]) 05/25/2019  . Morbid obesity ([place])  05/16/2019  . Cervical pain (neck) 04/09/2019  . Pedal edema 03/06/2019  . Pre-operative clearance 03/06/2019  . Pacemaker 02/01/2019  . History of sleeve gastrectomy 01/23/2019  . BMI 40.0-44.9, adult ([place]) 11/14/2018  . Chronic pain syndrome 10/03/2018  . Obstructive sleep apnea 10/03/2018  . Right shoulder pain 06/08/2018  . Chronic venous insufficiency 05/24/2018  . Lymphedema 05/24/2018  . Lumbar spondylosis 05/17/2018  . Leg swelling 05/09/2018  . Peripheral neuropathy 01/24/2018  . Insomnia 07/12/2017  . Fracture of phalanx of finger 05/31/2017  . Impingement syndrome of shoulder region 05/31/2017  . Constipation 12/15/2016  . Hemorrhoids 12/15/2016  . Hypothyroidism 10/03/2016  . Chronic shoulder bursitis 08/30/2016  . Positive ANA (antinuclear antibody) 08/30/2016  . Bradycardia 06/16/2016  . History of adenomatous polyp of colon 05/02/2016  . Elevated liver enzymes 04/06/2016  . OSA (obstructive sleep apnea) 04/06/2016  . Bilateral shoulder pain 01/13/2016  . Fatty liver 12/08/2015  . Arthritis 12/08/2015  . History of bariatric surgery 12/07/2015  . Genital herpes 11/11/2015  . Hyperlipidemia 11/11/2015  . Essential hypertension 11/11/2015  . Routine physical examination 11/11/2015  . Allergic rhinitis 11/11/2015  . GERD (gastroesophageal reflux disease) 11/11/2015  . Herpesviral infection, unspecified 01/08/2014  . Disorder of thyroid, unspecified 01/08/2014  . Lumbar radiculitis 11/19/2013  . Neuritis or radiculitis due to rupture of lumbar intervertebral disc 11/19/2013  . Monilial vaginitis 08/20/2013  . Gastritis and duodenitis 07/25/2013  . Impaired fasting glucose 07/17/2012  . Obesity, unspecified 07/17/2012  . Obesity 07/17/2012  . Anxiety and depression 03/15/2012  . Abnormal electrocardiogram (ECG) (EKG)  03/06/2012  . URI (upper respiratory infection) 01/16/2012    Past Surgical History:  Procedure Laterality Date  . ABDOMINAL HYSTERECTOMY      total for fibroids no h/o abnormal pap  . bariatric sleeve  2015  . BREAST EXCISIONAL BIOPSY Left 1998  . carpal tunnel repair    . COLONOSCOPY WITH PROPOFOL N/A 02/11/2016   Procedure: COLONOSCOPY WITH PROPOFOL;  Surgeon: Jonathon Bellows, MD;  Location[facility] ENDOSCOPY;  Service: Endoscopy;  Laterality: N/A;  . COLONOSCOPY WITH PROPOFOL N/A 10/01/2019   Procedure: COLONOSCOPY WITH PROPOFOL;  Surgeon: Jonathon Bellows, MD;  Location: [facility] ENDOSCOPY;  Service: Gastroenterology;  Laterality: N/A;  . ESOPHAGOGASTRODUODENOSCOPY (EGD) WITH PROPOFOL N/A 12/02/2019   Procedure: ESOPHAGOGASTRODUODENOSCOPY (EGD) WITH PROPOFOL;  Surgeon: Jonathon Bellows, MD;  Location[facility] ENDOSCOPY;  Service: Gastroenterology;  Laterality: N/A;  . HEMORRHOID SURGERY      Prior to Admission medications   Medication Sig Start Date End Date Taking? Authorizing Provider  acetaminophen (TYLENOL) 500 MG tablet Take by mouth.    [provider]  acyclovir (ZOVIRAX) 400 MG tablet TAKE 1 TABLET BY MOUTH EVERY DAY 03/11/20   [name], FNP  Ascorbic Acid (VITAMIN C) 1000 MG tablet Take 1,000 mg by mouth daily.    [provider]  aspirin 81 MG tablet Take 81 mg by mouth every other day.    [provider]  azelastine (ASTELIN) 0.1 % nasal spray USE 1 SPRAY EACH NOSTRIL TWICE A DAY AS NEEDED FOR ALLERGIES 08/09/18   [provider]  famotidine (PEPCID) 20 MG tablet Take 1 tablet (20 mg total) by mouth at bedtime. 04/30/20   [name], FNP  fexofenadine (ALLEGRA) 180 MG tablet TAKE 1 TABLET BY MOUTH EVERY DAY 02/04/20   [name], FNP  fluticasone (FLONASE) 50 MCG/ACT nasal spray PLACE 1 SPRAY INTO BOTH NOSTRILS DAILY. 03/02/20   [name], FNP  gabapentin (NEURONTIN) 100 MG capsule Take two tablet in the morning and two tablets midday PO. 02/25/20   [name], FNP  gabapentin (NEURONTIN) 300 MG capsule Take 1 capsule (300 mg total) by mouth at bedtime. 02/25/20   [name], FNP  hydrocortisone (ANUSOL-HC) 25 MG suppository Place 1 suppository (25 mg total) rectally 2 (two) times daily as needed for hemorrhoids or anal itching. 03/27/17   [name], FNP  levothyroxine (SYNTHROID) 50 MCG tablet TAKE 1 TABLET BY MOUTH EVERY DAY 03/11/20   [name], FNP  losartan (COZAAR) 100 MG tablet Take 1 tablet (100 mg total) by mouth at bedtime. 10/21/19   [name], FNP  Multiple Vitamins-Minerals (BARIATRIC MULTIVITAMINS/IRON PO) Take by mouth.    [provider]  omeprazole (PRILOSEC) 20 MG capsule Take 1 capsule (20 mg total) by mouth 2 (two) times daily before a meal. Patient taking differently: Take 20 mg by mouth every morning. 02/25/20   [name], FNP  Propylene Glycol (SYSTANE BALANCE) 0.6 % SOLN Apply to eye.    [provider]  traZODone (DESYREL) 100 MG tablet TAKE 1 TABLET BY MOUTH EVERYDAY AT BEDTIME 02/24/20   [name], FNP  triamterene-hydrochlorothiazide (DYAZIDE) 37.5-25 MG capsule Take 1 each (1 capsule total) by mouth daily. 05/08/20   [name], FNP    Allergies Celecoxib, Pollen extract, Nasacort [triamcinolone], and Vicodin [hydrocodone-acetaminophen]  Family History  Problem Relation Age of Onset  . Breast cancer Sister 62       materal 1/2 sister  .  Hypertension Sister   . Hypertension Mother   . Heart disease Father   . Hypertension Brother     Social History Social History   Tobacco Use  . Smoking status: Former Smoker    Packs/day: 1.50    Years: 24.00    Pack years: 36.00    Types: Cigarettes    Quit date: 1993    Years since quitting: 29.4  . Smokeless tobacco: Never Used  . Tobacco comment: quit 1995.   Vaping Use  . Vaping Use: Never used  Substance Use Topics  . Alcohol use: Not Currently  . Drug use: No    Review of Systems  Constitutional: No fever/chills Eyes: No visual changes. ENT: No sore throat. Respiratory: Denies  cough Cardiovascular: Denies chest pain Gastrointestinal: Positive abdominal pain Genitourinary: Negative for dysuria. Musculoskeletal: Negative for back pain. Skin: Negative for rash. Psychiatric: no mood changes,     ____________________________________________   PHYSICAL EXAM:  VITAL SIGNS: ED Triage Vitals  Enc Vitals Group     BP 06/09/20 1529 (!) 143/72     Pulse Rate 06/09/20 1529 74     Resp 06/09/20 1529 (!) 22     Temp 06/09/20 1529 98.2 F (36.8 C)     Temp Source 06/09/20 1529 Oral     SpO2 06/09/20 1529 97 %     Weight 06/09/20 1523 180 lb (81.6 kg)     Height 06/09/20 1523 5\' 1"  (1.549 m)     Head Circumference --      Peak Flow --      Pain Score 06/09/20 1523 10     Pain Loc --      Pain Edu? --      Excl. in [place]? --     Constitutional: Alert and oriented. Well appearing and in no acute distress. Eyes: Conjunctivae are normal.  Head: Atraumatic. Nose: No congestion/rhinnorhea. Mouth/Throat: Mucous membranes are moist.   Neck:  supple no lymphadenopathy noted Cardiovascular: Normal rate, regular rhythm. Heart sounds are normal Respiratory: Normal respiratory effort.  No retractions, lungs c t a  Abd: soft tender in the right upper quadrant and right lower quadrant, bs normal all 4 quad GU: deferred Musculoskeletal: FROM all extremities, warm and well perfused Neurologic:  Normal speech and language.  Skin:  Skin is warm, dry and intact. No rash noted. Psychiatric: Mood and affect are normal. Speech and behavior are normal.  ____________________________________________   LABS (all labs ordered are listed, but only abnormal results are displayed)  Labs Reviewed  COMPREHENSIVE METABOLIC PANEL - Abnormal; Notable for the following components:      Result Value   Glucose, Bld 130 (*)    Calcium 8.5 (*)    AST 47 (*)    All other components within normal limits  CBC - Abnormal; Notable for the following components:   WBC 11.2 (*)    Hemoglobin  11.5 (*)    HCT 35.8 (*)    All other components within normal limits  LIPASE, BLOOD  URINALYSIS, COMPLETE (UACMP) WITH MICROSCOPIC   ____________________________________________   ____________________________________________  RADIOLOGY  Ultrasound right upper quad Chest x-ray CT abdomen/pelvis, no contrast secondary to contrast shortage  ____________________________________________   PROCEDURES  Procedure(s) performed: No  Procedures    ____________________________________________   INITIAL IMPRESSION / ASSESSMENT AND PLAN / ED COURSE  Pertinent labs & imaging results that were available during my care of the patient were reviewed by me and considered in my medical decision making (see  chart for details).   Patient is a 70 year old female presents with abdominal pain.  See HPI.  Physical exam shows patient be stable at this time although she is in pain.  DDx: Postsurgical complication, acute appendicitis,colitis, pneumonia, pancreatitis, UTI  CBC has mild bump in WBC of 11.2, comprehensive metabolic panel shows glucose of 130, AST slightly elevated at 47, lipase normal   Chest x-ray Ultrasound abdomen is normal per radiology CT abdomen/pelvis without contrast UA still pending  Chest x-ray reviewed by me does not show any acute abnormality  CT abdomen/pelvis just shows some concerns for fluid, radiologist comments if we have concern for bile leak continue additional imaging  Care is being transferred to Dr. Cheri Fowler at this time.  He is aware that urinalysis needs to be obtained, patient is comfortable at this time with the pain medication and nausea medication.   Jennifer [name] was evaluated in Emergency Department on 06/09/2020 for the symptoms described in the history of present illness. She was evaluated in the context of the global COVID-19 pandemic, which necessitated consideration that the patient might be at risk for infection with the SARS-CoV-2 virus  that causes COVID-19. Institutional protocols and algorithms that pertain to the evaluation of patients at risk for COVID-19 are in a state of rapid change based on information released by regulatory bodies including the CDC and federal and state organizations. These policies and algorithms were followed during the patient's care in the ED.    As part of my medical decision making, I reviewed the following data within the [facility] notes reviewed and incorporated, Labs reviewed , EKG interpreted , Old chart reviewed, Patient signed out to Dr. Cheri Fowler, Radiograph reviewed , Evaluated by EM attending , Notes from prior ED visits and [place] Controlled Substance Database  ____________________________________________   FINAL CLINICAL IMPRESSION(S) / ED DIAGNOSES  Final diagnoses:  Status post cholecystectomy  Pain of upper abdomen      NEW MEDICATIONS STARTED DURING THIS VISIT:  New Prescriptions   No medications on file     Note:  This document was prepared using Dragon voice recognition software and may include unintentional dictation errors.    Versie Starks, PA-C 06/09/20 1945    Naaman Plummer, MD 06/10/20 1520

## 2020-06-09 NOTE — ED Notes (Signed)
Jennifer [name], Mayes will be in for HIDA scan in about 30-45 min

## 2020-06-09 NOTE — ED Notes (Signed)
Pt had a laproscopic cholecystectomy last Friday on 5/20 at [facility]. She has been healing nicely per pt. But today about 608 hours ago she started having some abdominal pain. Pt incision sites look well and healed up.

## 2020-06-10 DIAGNOSIS — Z803 Family history of malignant neoplasm of breast: Secondary | ICD-10-CM | POA: Diagnosis not present

## 2020-06-10 DIAGNOSIS — E1142 Type 2 diabetes mellitus with diabetic polyneuropathy: Secondary | ICD-10-CM | POA: Diagnosis present

## 2020-06-10 DIAGNOSIS — R0902 Hypoxemia: Secondary | ICD-10-CM | POA: Diagnosis not present

## 2020-06-10 DIAGNOSIS — M1990 Unspecified osteoarthritis, unspecified site: Secondary | ICD-10-CM | POA: Diagnosis present

## 2020-06-10 DIAGNOSIS — G629 Polyneuropathy, unspecified: Secondary | ICD-10-CM | POA: Diagnosis not present

## 2020-06-10 DIAGNOSIS — R52 Pain, unspecified: Secondary | ICD-10-CM | POA: Diagnosis not present

## 2020-06-10 DIAGNOSIS — Z791 Long term (current) use of non-steroidal anti-inflammatories (NSAID): Secondary | ICD-10-CM | POA: Diagnosis not present

## 2020-06-10 DIAGNOSIS — Z9884 Bariatric surgery status: Secondary | ICD-10-CM | POA: Diagnosis not present

## 2020-06-10 DIAGNOSIS — Z886 Allergy status to analgesic agent status: Secondary | ICD-10-CM | POA: Diagnosis not present

## 2020-06-10 DIAGNOSIS — R1011 Right upper quadrant pain: Secondary | ICD-10-CM | POA: Diagnosis not present

## 2020-06-10 DIAGNOSIS — Z888 Allergy status to other drugs, medicaments and biological substances status: Secondary | ICD-10-CM | POA: Diagnosis not present

## 2020-06-10 DIAGNOSIS — Z743 Need for continuous supervision: Secondary | ICD-10-CM | POA: Diagnosis not present

## 2020-06-10 DIAGNOSIS — K838 Other specified diseases of biliary tract: Secondary | ICD-10-CM

## 2020-06-10 DIAGNOSIS — Z885 Allergy status to narcotic agent status: Secondary | ICD-10-CM | POA: Diagnosis not present

## 2020-06-10 DIAGNOSIS — E785 Hyperlipidemia, unspecified: Secondary | ICD-10-CM | POA: Diagnosis present

## 2020-06-10 DIAGNOSIS — Z79890 Hormone replacement therapy (postmenopausal): Secondary | ICD-10-CM | POA: Diagnosis not present

## 2020-06-10 DIAGNOSIS — R6889 Other general symptoms and signs: Secondary | ICD-10-CM | POA: Diagnosis not present

## 2020-06-10 DIAGNOSIS — E7800 Pure hypercholesterolemia, unspecified: Secondary | ICD-10-CM | POA: Diagnosis present

## 2020-06-10 DIAGNOSIS — Z87891 Personal history of nicotine dependence: Secondary | ICD-10-CM | POA: Diagnosis not present

## 2020-06-10 DIAGNOSIS — Z9049 Acquired absence of other specified parts of digestive tract: Secondary | ICD-10-CM | POA: Diagnosis not present

## 2020-06-10 DIAGNOSIS — K760 Fatty (change of) liver, not elsewhere classified: Secondary | ICD-10-CM | POA: Diagnosis present

## 2020-06-10 DIAGNOSIS — Z7982 Long term (current) use of aspirin: Secondary | ICD-10-CM | POA: Diagnosis not present

## 2020-06-10 DIAGNOSIS — Z20822 Contact with and (suspected) exposure to covid-19: Secondary | ICD-10-CM | POA: Diagnosis present

## 2020-06-10 DIAGNOSIS — E039 Hypothyroidism, unspecified: Secondary | ICD-10-CM | POA: Diagnosis not present

## 2020-06-10 DIAGNOSIS — G589 Mononeuropathy, unspecified: Secondary | ICD-10-CM | POA: Diagnosis not present

## 2020-06-10 DIAGNOSIS — T8189XA Other complications of procedures, not elsewhere classified, initial encounter: Secondary | ICD-10-CM | POA: Diagnosis not present

## 2020-06-10 DIAGNOSIS — R279 Unspecified lack of coordination: Secondary | ICD-10-CM | POA: Diagnosis not present

## 2020-06-10 DIAGNOSIS — Z8249 Family history of ischemic heart disease and other diseases of the circulatory system: Secondary | ICD-10-CM | POA: Diagnosis not present

## 2020-06-10 DIAGNOSIS — R935 Abnormal findings on diagnostic imaging of other abdominal regions, including retroperitoneum: Secondary | ICD-10-CM | POA: Diagnosis not present

## 2020-06-10 DIAGNOSIS — Y833 Surgical operation with formation of external stoma as the cause of abnormal reaction of the patient, or of later complication, without mention of misadventure at the time of the procedure: Secondary | ICD-10-CM | POA: Diagnosis not present

## 2020-06-10 DIAGNOSIS — R188 Other ascites: Secondary | ICD-10-CM | POA: Diagnosis not present

## 2020-06-10 DIAGNOSIS — I10 Essential (primary) hypertension: Secondary | ICD-10-CM | POA: Diagnosis not present

## 2020-06-10 DIAGNOSIS — R109 Unspecified abdominal pain: Secondary | ICD-10-CM | POA: Diagnosis not present

## 2020-06-10 DIAGNOSIS — Z79899 Other long term (current) drug therapy: Secondary | ICD-10-CM | POA: Diagnosis not present

## 2020-06-10 DIAGNOSIS — G4733 Obstructive sleep apnea (adult) (pediatric): Secondary | ICD-10-CM | POA: Diagnosis present

## 2020-06-10 DIAGNOSIS — Y733 Surgical instruments, materials and gastroenterology and urology devices (including sutures) associated with adverse incidents: Secondary | ICD-10-CM | POA: Diagnosis present

## 2020-06-10 DIAGNOSIS — K5900 Constipation, unspecified: Secondary | ICD-10-CM | POA: Diagnosis not present

## 2020-06-10 DIAGNOSIS — K9189 Other postprocedural complications and disorders of digestive system: Secondary | ICD-10-CM | POA: Diagnosis not present

## 2020-06-10 DIAGNOSIS — K832 Perforation of bile duct: Secondary | ICD-10-CM | POA: Diagnosis not present

## 2020-06-10 DIAGNOSIS — K219 Gastro-esophageal reflux disease without esophagitis: Secondary | ICD-10-CM

## 2020-06-10 DIAGNOSIS — Y838 Other surgical procedures as the cause of abnormal reaction of the patient, or of later complication, without mention of misadventure at the time of the procedure: Secondary | ICD-10-CM | POA: Diagnosis present

## 2020-06-10 DIAGNOSIS — R1031 Right lower quadrant pain: Secondary | ICD-10-CM | POA: Diagnosis not present

## 2020-06-10 LAB — URINALYSIS, COMPLETE (UACMP) WITH MICROSCOPIC
Bacteria, UA: NONE SEEN
Bilirubin Urine: NEGATIVE
Glucose, UA: NEGATIVE mg/dL
Hgb urine dipstick: NEGATIVE
Ketones, ur: NEGATIVE mg/dL
Leukocytes,Ua: NEGATIVE
Nitrite: NEGATIVE
Protein, ur: NEGATIVE mg/dL
Specific Gravity, Urine: 1.023 (ref 1.005–1.030)
pH: 6 (ref 5.0–8.0)

## 2020-06-10 LAB — CBC
HCT: 37 % (ref 36.0–46.0)
Hemoglobin: 12.2 g/dL (ref 12.0–15.0)
MCH: 28 pg (ref 26.0–34.0)
MCHC: 33 g/dL (ref 30.0–36.0)
MCV: 84.9 fL (ref 80.0–100.0)
Platelets: 164 10*3/uL (ref 150–400)
RBC: 4.36 MIL/uL (ref 3.87–5.11)
RDW: 15.5 % (ref 11.5–15.5)
WBC: 9.6 10*3/uL (ref 4.0–10.5)
nRBC: 0 % (ref 0.0–0.2)

## 2020-06-10 LAB — COMPREHENSIVE METABOLIC PANEL
ALT: 53 U/L — ABNORMAL HIGH (ref 0–44)
AST: 248 U/L — ABNORMAL HIGH (ref 15–41)
Albumin: 3.9 g/dL (ref 3.5–5.0)
Alkaline Phosphatase: 63 U/L (ref 38–126)
Anion gap: 9 (ref 5–15)
BUN: 13 mg/dL (ref 8–23)
CO2: 27 mmol/L (ref 22–32)
Calcium: 8.8 mg/dL — ABNORMAL LOW (ref 8.9–10.3)
Chloride: 105 mmol/L (ref 98–111)
Creatinine, Ser: 0.84 mg/dL (ref 0.44–1.00)
GFR, Estimated: >60 mL/min (ref >60)
Glucose, Bld: 133 mg/dL — ABNORMAL HIGH (ref 70–99)
Potassium: 3.9 mmol/L (ref 3.5–5.1)
Sodium: 141 mmol/L (ref 135–145)
Total Bilirubin: 0.9 mg/dL (ref 0.3–1.2)
Total Protein: 6.6 g/dL (ref 6.5–8.1)

## 2020-06-10 LAB — RESP PANEL BY RT-PCR (FLU A&B, COVID) ARPGX2
Influenza A by PCR: NEGATIVE
Influenza B by PCR: NEGATIVE
SARS Coronavirus 2 by RT PCR: NEGATIVE

## 2020-06-10 LAB — PROTIME-INR
INR: 1.1 (ref 0.8–1.2)
Prothrombin Time: 13.7 seconds (ref 11.4–15.2)

## 2020-06-10 LAB — HIV ANTIBODY (ROUTINE TESTING W REFLEX): HIV Screen 4th Generation wRfx: NONREACTIVE

## 2020-06-10 MED ORDER — PIPERACILLIN-TAZOBACTAM 3.375 G IVPB 30 MIN
3.3750 g | Freq: Three times a day (TID) | INTRAVENOUS | Status: DC
  Administered 2020-06-10 – 2020-06-12 (×5): 3.375 g via INTRAVENOUS
  Filled 2020-06-10 (×14): qty 50

## 2020-06-10 MED ORDER — LORATADINE 10 MG PO TABS
10.0000 mg | ORAL_TABLET | Freq: Every day | ORAL | Status: DC
  Filled 2020-06-10 (×3): qty 1

## 2020-06-10 MED ORDER — OXYCODONE-ACETAMINOPHEN 5-325 MG PO TABS
1.0000 | ORAL_TABLET | ORAL | Status: DC | PRN
  Administered 2020-06-10 – 2020-06-12 (×5): 1 via ORAL
  Filled 2020-06-10 (×5): qty 1

## 2020-06-10 MED ORDER — KETOROLAC TROMETHAMINE 15 MG/ML IJ SOLN
15.0000 mg | Freq: Four times a day (QID) | INTRAMUSCULAR | Status: DC | PRN
  Administered 2020-06-10 – 2020-06-12 (×3): 15 mg via INTRAVENOUS
  Filled 2020-06-10 (×4): qty 1

## 2020-06-10 MED ORDER — SODIUM CHLORIDE 0.9 % IV SOLN: INTRAVENOUS | Status: AC

## 2020-06-10 MED ORDER — TRAZODONE HCL 50 MG PO TABS
100.0000 mg | ORAL_TABLET | Freq: Every evening | ORAL | Status: DC | PRN
  Administered 2020-06-12: 04:00:00 100 mg via ORAL
  Filled 2020-06-10: qty 2

## 2020-06-10 MED ORDER — SODIUM CHLORIDE 0.9 % IV SOLN: INTRAVENOUS | Status: DC

## 2020-06-10 MED ORDER — HYDROCORTISONE ACETATE 25 MG RE SUPP
25.0000 mg | Freq: Two times a day (BID) | RECTAL | Status: DC | PRN
  Filled 2020-06-10: qty 1

## 2020-06-10 MED ORDER — TRIAMTERENE-HCTZ 37.5-25 MG PO TABS
1.0000 | ORAL_TABLET | Freq: Every day | ORAL | Status: DC
  Administered 2020-06-10 – 2020-06-12 (×3): 1 via ORAL
  Filled 2020-06-10 (×3): qty 1

## 2020-06-10 MED ORDER — FLUTICASONE PROPIONATE 50 MCG/ACT NA SUSP
1.0000 | Freq: Every day | NASAL | Status: DC
  Administered 2020-06-10: 1 via NASAL
  Filled 2020-06-10: qty 16

## 2020-06-10 MED ORDER — MORPHINE SULFATE (PF) 4 MG/ML IV SOLN
4.0000 mg | Freq: Once | INTRAVENOUS | Status: AC
Start: 2020-06-10 — End: 2020-06-10
  Administered 2020-06-10: 4 mg via INTRAVENOUS
  Filled 2020-06-10: qty 1

## 2020-06-10 MED ORDER — ACETAMINOPHEN 325 MG PO TABS
650.0000 mg | ORAL_TABLET | Freq: Four times a day (QID) | ORAL | Status: DC | PRN
  Administered 2020-06-10 – 2020-06-11 (×2): 650 mg via ORAL
  Filled 2020-06-10 (×2): qty 2

## 2020-06-10 MED ORDER — ASCORBIC ACID 500 MG PO TABS
1000.0000 mg | ORAL_TABLET | Freq: Every day | ORAL | Status: DC
  Administered 2020-06-10 – 2020-06-12 (×3): 1000 mg via ORAL
  Filled 2020-06-10 (×3): qty 2

## 2020-06-10 MED ORDER — ONDANSETRON HCL 4 MG PO TABS: 4.0000 mg | ORAL_TABLET | Freq: Four times a day (QID) | ORAL | Status: DC | PRN

## 2020-06-10 MED ORDER — LEVOTHYROXINE SODIUM 50 MCG PO TABS
50.0000 ug | ORAL_TABLET | Freq: Every day | ORAL | Status: DC
  Administered 2020-06-10 – 2020-06-12 (×2): 50 ug via ORAL
  Filled 2020-06-10 (×2): qty 1

## 2020-06-10 MED ORDER — MORPHINE SULFATE (PF) 2 MG/ML IV SOLN
2.0000 mg | INTRAVENOUS | Status: DC | PRN
Start: 2020-06-10 — End: 2020-06-12
  Administered 2020-06-10 – 2020-06-11 (×5): 2 mg via INTRAVENOUS
  Filled 2020-06-10 (×5): qty 1

## 2020-06-10 MED ORDER — TRAZODONE HCL 50 MG PO TABS: 25.0000 mg | ORAL_TABLET | Freq: Every evening | ORAL | Status: DC | PRN

## 2020-06-10 MED ORDER — FAMOTIDINE 20 MG PO TABS
20.0000 mg | ORAL_TABLET | Freq: Every day | ORAL | Status: DC
  Administered 2020-06-10 – 2020-06-11 (×2): 20 mg via ORAL
  Filled 2020-06-10 (×2): qty 1

## 2020-06-10 MED ORDER — ONDANSETRON HCL 4 MG/2ML IJ SOLN: 4.0000 mg | Freq: Four times a day (QID) | INTRAMUSCULAR | Status: DC | PRN

## 2020-06-10 MED ORDER — HYDROMORPHONE HCL 1 MG/ML IJ SOLN
1.0000 mg | Freq: Once | INTRAMUSCULAR | Status: AC
Start: 2020-06-10 — End: 2020-06-10
  Administered 2020-06-10: 1 mg via INTRAVENOUS
  Filled 2020-06-10: qty 1

## 2020-06-10 MED ORDER — GABAPENTIN 300 MG PO CAPS
300.0000 mg | ORAL_CAPSULE | Freq: Every day | ORAL | Status: DC
  Administered 2020-06-10 – 2020-06-11 (×2): 300 mg via ORAL
  Filled 2020-06-10 (×2): qty 1

## 2020-06-10 MED ORDER — DIPHENHYDRAMINE HCL 25 MG PO CAPS: 25.0000 mg | ORAL_CAPSULE | ORAL | Status: DC | PRN

## 2020-06-10 MED ORDER — LOSARTAN POTASSIUM 50 MG PO TABS
100.0000 mg | ORAL_TABLET | Freq: Every day | ORAL | Status: DC
  Administered 2020-06-10: 21:00:00 100 mg via ORAL
  Filled 2020-06-10 (×2): qty 2

## 2020-06-10 MED ORDER — AZELASTINE HCL 0.1 % NA SOLN
2.0000 | Freq: Two times a day (BID) | NASAL | Status: DC
  Administered 2020-06-10 – 2020-06-11 (×3): 2 via NASAL
  Filled 2020-06-10: qty 30

## 2020-06-10 MED ORDER — GABAPENTIN 100 MG PO CAPS
200.0000 mg | ORAL_CAPSULE | Freq: Two times a day (BID) | ORAL | Status: DC
  Administered 2020-06-10 – 2020-06-12 (×4): 200 mg via ORAL
  Filled 2020-06-10 (×4): qty 2

## 2020-06-10 MED ORDER — ACETAMINOPHEN 650 MG RE SUPP: 650.0000 mg | Freq: Four times a day (QID) | RECTAL | Status: DC | PRN

## 2020-06-10 MED ORDER — MAGNESIUM HYDROXIDE 400 MG/5ML PO SUSP
30.0000 mL | Freq: Every day | ORAL | Status: DC | PRN
  Administered 2020-06-10: 30 mL via ORAL
  Filled 2020-06-10 (×2): qty 30

## 2020-06-10 MED ORDER — ACYCLOVIR 200 MG PO CAPS
400.0000 mg | ORAL_CAPSULE | Freq: Every day | ORAL | Status: DC
  Administered 2020-06-10 – 2020-06-12 (×3): 400 mg via ORAL
  Filled 2020-06-10 (×3): qty 2

## 2020-06-10 MED ORDER — PANTOPRAZOLE SODIUM 40 MG PO TBEC
40.0000 mg | DELAYED_RELEASE_TABLET | Freq: Every day | ORAL | Status: DC
  Administered 2020-06-10 – 2020-06-12 (×3): 40 mg via ORAL
  Filled 2020-06-10 (×3): qty 1

## 2020-06-10 MED ORDER — ENOXAPARIN SODIUM 40 MG/0.4ML IJ SOSY
0.5000 mg/kg | PREFILLED_SYRINGE | INTRAMUSCULAR | Status: DC
  Administered 2020-06-10: 09:00:00 40 mg via SUBCUTANEOUS
  Filled 2020-06-10: qty 0.4

## 2020-06-10 NOTE — ED Notes (Signed)
Pain meds given

## 2020-06-10 NOTE — Progress Notes (Signed)
Dr. Kurtis Bushman notified Dr. Celine Ahr regarding wound and s he came to see patient. She stated for now skin glue would not be an option due to area opening and risk for infection. She stated that all we could do now is apply abd pads and secure with tape. Change dressings as needed due to amount of drainage. Dr. Celine Ahr did go in and see patient.

## 2020-06-10 NOTE — ED Notes (Signed)
Dr ward in with pt and family.

## 2020-06-10 NOTE — Progress Notes (Signed)
On further review and after speaking to the bariatric surgeon, it appear the patient had a duodenal switch and not a sleeve gastrectomy. This means the patient will need to have the ERCP at [facility] or [facility] most likely. I have put a call out to [facility] to see if they can handle this. We for sure can not handle this case.

## 2020-06-10 NOTE — H&P (Signed)
[place]   PATIENT NAME: Jennifer [name]    MR#:  [id number]  DATE OF BIRTH:  27-Jun-1950  DATE OF ADMISSION:  06/09/2020  PRIMARY CARE PHYSICIAN: [name], FNP   Patient is coming from: Home  REQUESTING/REFERRING PHYSICIAN: Ward, Delice Bison, DO  CHIEF COMPLAINT:   Chief Complaint  Patient presents with  . Abdominal Pain  . Shoulder Pain    HISTORY OF PRESENT ILLNESS:  Jennifer [name] is a 70 y.o. female with medical history significant for hypertension, dyslipidemia, hypothyroidism, sleep apnea, osteoarthritis GERD and anxiety, who presented to the ER with acute onset of right upper quadrant abdominal pain with associated vomiting and mild abdominal distention with radiation to her right shoulder.  She has been having constipation.  She had a lap cholecystectomy on Friday.  No chest pain or dyspnea or cough or wheezing.  No fever or chills.  No dysuria, oliguria or hematuria or flank pain.  ED Course: When the patient came to the ER blood pressure was 151/70 with otherwise normal vital signs.  Labs revealed unremarkable BMP.  AST was elevated to 48 and ALT 53 with normal alk phos of 63 and normal bili.  CBC was within normal.  INR was 1.1 and PT 13.7.  Influenza antigens and COVID-19 PCR came back negative.  UA came back negative. EKG as reviewed by me : EKG showed normal sinus rhythm with a rate of 78 with low voltage QRS and prolonged QT interval with QTC of 490 MS Imaging: Portable chest x-ray showed no acute cardiopulmonary disease. Right upper quadrant ultrasound showed that she is status postcholecystectomy with no acute findings.  Abdominal pelvic CT scan revealed the following: 1. Post recent cholecystectomy with trace ill-defined fluid in the cholecystectomy bed. Small amount of perihepatic and pelvic free fluid. This may be postoperative in nature. If there is clinical concern for bile leak, recommend further assessment with nuclear medicine  hepatic biliary scan. No evidence of abscess. 2. Moderate colonic stool burden with colonic redundancy, suggesting constipation. 3. Subcutaneous edema and skin thickening adjacent to the umbilicus is likely related to laparoscopic port site, superimposed infection not excluded.   The patient was given 50 mcg of IV fentanyl, 1 mg of IV Dilaudid, 5 mg of IV Reglan, 4 mg of IV morphine sulfate x3 and hydration with IV normal saline at 100 mL/h.  There were no beds available at [facility] and therefore the patient was not accepted for transfer.  Dr. Hampton Abbot was contacted about the patient and thought it was nonsurgical.  Dr. Bonna Gains was notified about the patient and accepted to discussed with Dr. Allen Norris for potential need for ERCP.  She will be admitted to a medical bed for further evaluation and management. PAST MEDICAL HISTORY:   Past Medical History:  Diagnosis Date  . Anxiety   . Bariatric surgery status   . Constipation   . Dysrhythmia   . Elevated liver enzymes   . GERD (gastroesophageal reflux disease)   . Hemorrhoids   . Herpes genitalis   . High cholesterol   . Hyperlipidemia   . Hypertension   . Hypothyroidism   . Neuropathy   . Osteoarthritis   . Sleep apnea     PAST SURGICAL HISTORY:   Past Surgical History:  Procedure Laterality Date  . ABDOMINAL HYSTERECTOMY     total for fibroids no h/o abnormal pap  . bariatric sleeve  2015  . BREAST EXCISIONAL BIOPSY Left 1998  . carpal  tunnel repair    . COLONOSCOPY WITH PROPOFOL N/A 02/11/2016   Procedure: COLONOSCOPY WITH PROPOFOL;  Surgeon: Jonathon Bellows, MD;  Location: [facility] ENDOSCOPY;  Service: Endoscopy;  Laterality: N/A;  . COLONOSCOPY WITH PROPOFOL N/A 10/01/2019   Procedure: COLONOSCOPY WITH PROPOFOL;  Surgeon: Jonathon Bellows, MD;  Location: [facility] ENDOSCOPY;  Service: Gastroenterology;  Laterality: N/A;  . ESOPHAGOGASTRODUODENOSCOPY (EGD) WITH PROPOFOL N/A 12/02/2019   Procedure: ESOPHAGOGASTRODUODENOSCOPY (EGD) WITH PROPOFOL;   Surgeon: Jonathon Bellows, MD;  Location: [facility] ENDOSCOPY;  Service: Gastroenterology;  Laterality: N/A;  . HEMORRHOID SURGERY      SOCIAL HISTORY:   Social History   Tobacco Use  . Smoking status: Former Smoker    Packs/day: 1.50    Years: 24.00    Pack years: 36.00    Types: Cigarettes    Quit date: 1993    Years since quitting: 29.4  . Smokeless tobacco: Never Used  . Tobacco comment: quit 1995.   Substance Use Topics  . Alcohol use: Not Currently    FAMILY HISTORY:   Family History  Problem Relation Age of Onset  . Breast cancer Sister 36       materal 1/2 sister  . Hypertension Sister   . Hypertension Mother   . Heart disease Father   . Hypertension Brother     DRUG ALLERGIES:   Allergies  Allergen Reactions  . Celecoxib Hives  . Pollen Extract   . Nasacort [Triamcinolone] Other (See Comments)    Nasal - Nose Bleeds  . Vicodin [Hydrocodone-Acetaminophen] Nausea Only    REVIEW OF SYSTEMS:   ROS As per history of present illness. All pertinent systems were reviewed above. Constitutional, HEENT, cardiovascular, respiratory, GI, GU, musculoskeletal, neuro, psychiatric, endocrine, integumentary and hematologic systems were reviewed and are otherwise negative/unremarkable except for positive findings mentioned above in the HPI.   MEDICATIONS AT HOME:   Prior to Admission medications   Medication Sig Start Date End Date Taking? Authorizing Provider  acetaminophen (TYLENOL) 500 MG tablet Take by mouth.   Yes [provider]  acyclovir (ZOVIRAX) 400 MG tablet TAKE 1 TABLET BY MOUTH EVERY DAY 03/11/20  Yes [name], FNP  Ascorbic Acid (VITAMIN C) 1000 MG tablet Take 1,000 mg by mouth daily.   Yes [provider]  aspirin 81 MG tablet Take 81 mg by mouth every other day.   Yes [provider]  azelastine (ASTELIN) 0.1 % nasal spray USE 1 SPRAY EACH NOSTRIL TWICE A DAY AS NEEDED FOR ALLERGIES 08/09/18  Yes [provider]   famotidine (PEPCID) 20 MG tablet Take 1 tablet (20 mg total) by mouth at bedtime. 04/30/20  Yes [name], FNP  fexofenadine (ALLEGRA) 180 MG tablet TAKE 1 TABLET BY MOUTH EVERY DAY 02/04/20  Yes Arnett, Yvetta Coder, FNP  fluticasone (FLONASE) 50 MCG/ACT nasal spray PLACE 1 SPRAY INTO BOTH NOSTRILS DAILY. 03/02/20  Yes [name], FNP  gabapentin (NEURONTIN) 100 MG capsule Take two tablet in the morning and two tablets midday PO. 02/25/20  Yes Arnett, Yvetta Coder, FNP  gabapentin (NEURONTIN) 300 MG capsule Take 1 capsule (300 mg total) by mouth at bedtime. 02/25/20  Yes [name], FNP  levothyroxine (SYNTHROID) 50 MCG tablet TAKE 1 TABLET BY MOUTH EVERY DAY 03/11/20  Yes [name], FNP  losartan (COZAAR) 100 MG tablet Take 1 tablet (100 mg total) by mouth at bedtime. 10/21/19  Yes [name], FNP  Multiple Vitamins-Minerals (BARIATRIC MULTIVITAMINS/IRON PO) Take by mouth.  Yes [provider]  omeprazole (PRILOSEC) 20 MG capsule Take 1 capsule (20 mg total) by mouth 2 (two) times daily before a meal. Patient taking differently: Take 20 mg by mouth every morning. 02/25/20  Yes Arnett, Yvetta Coder, FNP  ondansetron (ZOFRAN) 4 MG tablet Take 4 mg by mouth every 8 (eight) hours as needed. 06/05/20  Yes [provider]  oxyCODONE-acetaminophen (PERCOCET/ROXICET) 5-325 MG tablet Take 1 tablet by mouth every 4 (four) hours as needed. 06/05/20 06/10/20 Yes [provider]  traZODone (DESYREL) 100 MG tablet TAKE 1 TABLET BY MOUTH EVERYDAY AT BEDTIME 02/24/20  Yes [name], FNP  triamterene-hydrochlorothiazide (DYAZIDE) 37.5-25 MG capsule Take 1 each (1 capsule total) by mouth daily. 05/08/20  Yes Arnett, Yvetta Coder, FNP  hydrocortisone (ANUSOL-HC) 25 MG suppository Place 1 suppository (25 mg total) rectally 2 (two) times daily as needed for hemorrhoids or anal itching. 03/27/17   [name], FNP  Propylene Glycol (SYSTANE BALANCE) 0.6 % SOLN  Apply to eye.    [provider]      VITAL SIGNS:  Blood pressure (!) 158/73, pulse 82, temperature 98 F (36.7 C), temperature source Oral, resp. rate 18, height _0  (1.549 m), weight 81.6 kg, SpO2 97 %.  PHYSICAL EXAMINATION:  Physical Exam  GENERAL:  70 y.o.-year-old African-American female patient lying in the bed with no acute distress.  EYES: Pupils equal, round, reactive to light and accommodation. No scleral icterus. Extraocular muscles intact.  HEENT: Head atraumatic, normocephalic. Oropharynx and nasopharynx clear.  NECK:  Supple, no jugular venous distention. No thyroid enlargement, no tenderness.  LUNGS: Normal breath sounds bilaterally, no wheezing, rales,rhonchi or crepitation. No use of accessory muscles of respiration.  CARDIOVASCULAR: Regular rate and rhythm, S1, S2 normal. No murmurs, rubs, or gallops.  ABDOMEN: Soft, mildly distended with right upper quadrant tenderness as well as epigastric tenderness without rebound tenderness guarding or rigidity.  Bowel sounds present. No organomegaly or mass.  EXTREMITIES: No pedal edema, cyanosis, or clubbing.  NEUROLOGIC: Cranial nerves II through XII are intact. Muscle strength 5/5 in all extremities. Sensation intact. Gait not checked.  PSYCHIATRIC: The patient is alert and oriented x 3.  Normal affect and good eye contact. SKIN: No obvious rash, lesion, or ulcer.   LABORATORY PANEL:   CBC Recent Labs  Lab 06/09/20 1530  WBC 11.2*  HGB 11.5*  HCT 35.8*  PLT 156   ------------------------------------------------------------------------------------------------------------------  Chemistries  Recent Labs  Lab 06/09/20 1530  NA 140  K 5.0  CL 104  CO2 23  GLUCOSE 130*  BUN 12  CREATININE 0.84  CALCIUM 8.5*  AST 47*  ALT 23  ALKPHOS 54  BILITOT 0.9   ------------------------------------------------------------------------------------------------------------------  Cardiac Enzymes No results  for input(s): TROPONINI in the last 168 hours. ------------------------------------------------------------------------------------------------------------------  RADIOLOGY:  CT ABDOMEN PELVIS WO CONTRAST  Result Date: 06/09/2020 CLINICAL DATA:  Abdominal pain. Cholecystectomy 5 days ago with increasing pain. EXAM: CT ABDOMEN AND PELVIS WITHOUT CONTRAST TECHNIQUE: Multidetector CT imaging of the abdomen and pelvis was performed following the standard protocol without IV contrast. COMPARISON:  CT 05/24/2019.  Abdominal ultrasound earlier today. FINDINGS: Lower chest: Hypoventilatory changes in the lung bases. No pleural fluid. Hepatobiliary: Cholecystectomy. No biliary dilatation. Trace ill-defined fluid in the cholecystectomy bed. Small amount of perihepatic fluid. No evidence of focal hepatic lesion. Pancreas: No ductal dilatation or inflammation. Spleen: Normal in size without focal abnormality. Adrenals/Urinary Tract: Normal adrenal glands. No renal stones or obstructive uropathy. No hydronephrosis. Partially  distended urinary bladder. No evidence of focal renal lesion. Stomach/Bowel: Chain sutures about the greater curvature. There is also gastric bypass changes with gastrojejunostomy. Anastomotic sutures at the jejunal junction. No evidence of obstruction. Normal appendix. Moderate volume of stool throughout the colon with colonic redundancy. No colonic wall thickening or inflammation. Vascular/Lymphatic: Aortic atherosclerosis. No abdominopelvic adenopathy. Reproductive: Hysterectomy without adnexal mass. Other: Small volume perihepatic free fluid measuring simple fluid density. Small amount of free fluid in the pelvis. Soft tissue edema involves the anterior abdominal wall adjacent to the umbilicus, likely laparoscopic port site. There is no confluent subcutaneous collection. Musculoskeletal: There are no acute or suspicious osseous abnormalities. Lower lumbar facet hypertrophy. IMPRESSION: 1. Post  recent cholecystectomy with trace ill-defined fluid in the cholecystectomy bed. Small amount of perihepatic and pelvic free fluid. This may be postoperative in nature. If there is clinical concern for bile leak, recommend further assessment with nuclear medicine hepatic biliary scan. No evidence of abscess. 2. Moderate colonic stool burden with colonic redundancy, suggesting constipation. 3. Subcutaneous edema and skin thickening adjacent to the umbilicus is likely related to laparoscopic port site, superimposed infection not excluded. Aortic Atherosclerosis (ICD10-I70.0). Electronically Signed   By: Keith Rake M.D.   On: 06/09/2020 19:35   DG Chest Portable 1 View  Result Date: 06/09/2020 CLINICAL DATA:  Right upper quadrant pain radiating into the shoulder. EXAM: PORTABLE CHEST 1 VIEW COMPARISON:  PA and lateral chest 01/25/2013. FINDINGS: Lungs clear. Lung volumes are somewhat low. Heart size is normal. No pneumothorax or pleural fluid. No acute or focal bony abnormality. IMPRESSION: Negative chest. Electronically Signed   By: Inge Rise M.D.   On: 06/09/2020 19:20   NM HEPATOBILIARY LEAK (POST-SURGICAL)  Result Date: 06/09/2020 CLINICAL DATA:  Right upper quadrant and right lower quadrant abdominal pain. Cholecystectomy 5 days prior. Intra-abdominal fluid collection. EXAM: NUCLEAR MEDICINE HEPATOBILIARY IMAGING TECHNIQUE: Sequential images of the abdomen were obtained out to 60 minutes following intravenous administration of radiopharmaceutical. RADIOPHARMACEUTICALS:  5.36 mCi Tc-49m Choletec IV COMPARISON:  None. Findings are correlated with CT examination of 06/09/2020 FINDINGS: There is prompt uptake and clearance of radiotracer from the blood pool. There is prompt excretion of contrast into the biliary tree. The gallbladder is not identified in keeping with history of cholecystectomy. There is leakage of contrast from the expected level of the mid common duct with radiotracer pooling  within the right mid abdomen and subsequently into the pelvis. There is no passage of radiotracer into the distal duct and duodenum. IMPRESSION: Biliary leak arising from the level of the mid common bile duct with nonvisualization of the distal duct and duodenum. Electronically Signed   By: AFidela SalisburyMD   On: 06/09/2020 23:39   UK[place]Abdomen Limited RUQ (LIVER/GB)  Result Date: 06/09/2020 CLINICAL DATA:  Acute right upper quadrant pain after cholecystectomy 4 days ago. EXAM: ULTRASOUND ABDOMEN LIMITED RIGHT UPPER QUADRANT COMPARISON:  April 16, 2020. FINDINGS: Gallbladder: Status post cholecystectomy. Small amount of free fluid is seen in the gallbladder fossa which is expected postoperative finding. Common bile duct: Diameter: 4 mm which is within normal limits. Liver: No focal lesion identified. Within normal limits in parenchymal echogenicity. Portal vein is patent on color Doppler imaging with normal direction of blood flow towards the liver. Other: None. IMPRESSION: Status post cholecystectomy. No other abnormality seen in the right upper quadrant of the abdomen. Electronically Signed   By: JMarijo ConceptionM.D.   On: 06/09/2020 16:52  IMPRESSION AND PLAN:  Active Problems:   Right upper quadrant abdominal pain  1.  Right upper quadrant abdominal pain with associated vomiting, concerning for common bile duct biliary leak.  The patient is status post laparoscopic cholecystectomy. - The patient will be admitted to a medical bed. - Pain management will be provided. - GI consultation will be obtained. - Dr. Bonna Gains will be notified about the patient.  2.  GERD. - We will continue PPI therapy and H2 blocker therapy.  3.  Peripheral neuropathy. - We will continue Neurontin.  4.  Hypothyroidism. - We will continue Synthroid.  5.  Essential hypertension. - We will continue Dyazide  DVT prophylaxis: Lovenox. Code Status: full code. Family Communication:  The plan of care was  discussed in details with the patient (and family). I answered all questions. The patient agreed to proceed with the above mentioned plan. Further management will depend upon hospital course. Disposition Plan: Back to previous home environment Consults called: Gastroenterology. All the records are reviewed and case discussed with ED provider.  Status is: Inpatient  Remains inpatient appropriate because:Ongoing active pain requiring inpatient pain management, Ongoing diagnostic testing needed not appropriate for outpatient work up, Unsafe d/c plan, IV treatments appropriate due to intensity of illness or inability to take PO and Inpatient level of care appropriate due to severity of illness   Dispo: The patient is from: Home              Anticipated d/c is to: Home              Patient currently is not medically stable to d/c.   Difficult to place patient No   TOTAL TIME TAKING CARE OF THIS PATIENT: 55 minutes.    Christel Mormon M.D on 06/10/2020 at 3:23 AM  [facility]   From 7 PM-7 AM, contact night-coverage [email or web address]  CC: Primary care physician; [name], FNP

## 2020-06-10 NOTE — Progress Notes (Signed)
Removed ostomy appliance due to leaking again. Placed 3 abd pads and secured with paper tape. Area dry and intact.

## 2020-06-10 NOTE — ED Notes (Signed)
Report messaged to Sandria Bales

## 2020-06-10 NOTE — Progress Notes (Signed)
Patient ID: Jennifer [name], female   DOB: 1950/06/27, 70 y.o.   MRN: [id number]  This is a no charge note as patient was admitted this AM.Jennifer [name] is a 70 y.o. female with medical history significant for hypertension, dyslipidemia, hypothyroidism, sleep apnea, osteoarthritis GERD and anxiety, who presented to the ER with acute onset of right upper quadrant abdominal pain with associated vomiting and mild abdominal distention with radiation to her right shoulder.  She has been having constipation.  She had a lap cholecystectomy on Friday.  No chest pain or dyspnea or cough or wheezing.  No fever or chills.  No dysuria, oliguria or hematuria or flank pain.  Concern for common bile duct biliary leak.  GI consulted.  Due to her previous type of gastric bypass unable to perform ERCP here.  Dr. Aletha Halim spoke to [facility], patient will be transferred for day trip to [facility] tomorrow for endoscopy.

## 2020-06-10 NOTE — Progress Notes (Signed)
Called into room by tech, Jennifer [name] stating that patient was leaking from one of her previous surgical incisions. When entering in room, patient was sitting on commode holding her umbilical site. She was having moderate drainage of yellow colored bile with tinges of blood throughout. Applied pressure for about 5 minutes and applied two packs of 4x4 gauze and papertape secure with mesh panties. Ambulated patient back to bed. Once she was settled pulled gown back and noticed gauze was soaked and bile was oozing out of dressing. Notified Dr. Kurtis Bushman and she stated if I could pack site. I stated to her that site was too small for me to pack and that I would enforce ABD pads with papertape. She asked if we needed wound care or surgery. I stated to her that it seemed that she needed some skin glue to area but that she had moderate amount of drainage coming for site. Applied 3 ABD pads secure with papertape and placed mesh panties back on. She is resting in bed, awaiting further orders. Bed in low position and call bell in reach.

## 2020-06-10 NOTE — Progress Notes (Signed)
I was contacted by the medicine service due to voluminous quantities of bile leaking from the patient's periumbilical incision.  On examination, the patient was in no distress.  She had ABD pads over the site that she reported the nurse had placed approximately 45 minutes prior to my evaluation.  They were already starting to become saturated.  The pads were removed so that I could inspect the site.  There is a roughly 1 mm opening at the periumbilical trocar site, from which bile-stained fluid is freely flowing.  I have recommended that they contact the wound ostomy nursing group for pouching, as I do not think skin glue would hold and the site is far too small to pack.  I understand the patient has a day past to [facility] tomorrow for ERCP and stenting, which will hopefully result in cessation of the bile leak.  No surgical intervention indicated at this time.

## 2020-06-10 NOTE — Progress Notes (Signed)
This patient came into the ER after having a cholecystectomy at [facility] by Dr. Gaynelle Arabian.  The patient was found to have a bile duct leak.  It was determined that the patient would stay at our hospital due to a lack of beds at [facility] and since the patient was stable without any signs of infection.  The patient will be kept n.p.o. after midnight and an ERCP will be planned for tomorrow.  I will discussed the risks benefits and alternatives with the patient prior to having the procedure tomorrow.

## 2020-06-10 NOTE — TOC Initial Note (Signed)
Transition of Care [facility]) - Initial/Assessment Note    Patient Details  Name: Jennifer [name] MRN: [id number] Date of Birth: Feb 02, 1950  Transition of Care [facility]) CM/SW Contact:    Shelbie Hutching, RN Phone Number: 06/10/2020, 4:40 PM  Clinical Narrative:                 Patient needs to have an ERCP but it cannot be done here at [facility].  Physician at [facility] has agreed to accept patient for the procedure but they do not have any beds so cannot admit the patient to [facility].  They will complete ERCP for patient as an outpatient.  Patient will be picked up from here around 10 am and taken over to [facility] GI procedures by [facility].  EMS will not be able to stay with patient but when procedure is completed and patient ready to return GI procedures can call and they will go pick the patient back up and bring back to [facility].  Charge nurse at [facility] GI procedure, message left- MD also updated.  TOC will follow up tomorrow morning.        Patient Goals and CMS Choice        Expected Discharge Plan and Services                                                Prior Living Arrangements/Services                       Activities of Daily Living Home Assistive Devices/Equipment: None ADL Screening (condition at time of admission) Patient's cognitive ability adequate to safely complete daily activities?: Yes Is the patient deaf or have difficulty hearing?: No Does the patient have difficulty seeing, even when wearing glasses/contacts?: No Does the patient have difficulty concentrating, remembering, or making decisions?: No Patient able to express need for assistance with ADLs?: Yes Does the patient have difficulty dressing or bathing?: No Independently performs ADLs?: Yes (appropriate for developmental age) Does the patient have difficulty walking or climbing stairs?: No Weakness of Legs: None Weakness of Arms/Hands: None  Permission Sought/Granted                  Emotional  Assessment              Admission diagnosis:  Status post cholecystectomy [Z90.49] Common bile duct leak [K83.8] Right upper quadrant abdominal pain [R10.11] Pain of upper abdomen [R10.10] Patient Active Problem List   Diagnosis Date Noted  . Right upper quadrant abdominal pain 06/10/2020  . Lumbar back pain 05/18/2020  . Muscle spasm 05/18/2020  . Bilateral tinnitus 04/30/2020  . Headache 03/30/2020  . Irregular heartbeat 02/25/2020  . Diabetes mellitus without complication ([place]) 55/73/2202  . Aspirin long-term use 10/22/2019  . Wound ballistics 08/29/2019  . Displacement of lumbar intervertebral disc without myelopathy 08/29/2019  . Helicobacter pylori gastrointestinal tract infection 08/29/2019  . Hypercholesterolemia 08/29/2019  . Phlebitis after infusion 06/26/2019  . Superficial thrombophlebitis of left upper extremity 06/26/2019  . BMI 38.0-38.9,adult 06/19/2019  . Small bowel obstruction ([place]) 05/25/2019  . Morbid obesity ([place]) 05/16/2019  . Cervical pain (neck) 04/09/2019  . Pedal edema 03/06/2019  . Pre-operative clearance 03/06/2019  . Pacemaker 02/01/2019  . History of sleeve gastrectomy 01/23/2019  . BMI 40.0-44.9, adult ([place]) 11/14/2018  . Chronic pain syndrome 10/03/2018  .  Obstructive sleep apnea 10/03/2018  . Right shoulder pain 06/08/2018  . Chronic venous insufficiency 05/24/2018  . Lymphedema 05/24/2018  . Lumbar spondylosis 05/17/2018  . Leg swelling 05/09/2018  . Peripheral neuropathy 01/24/2018  . Insomnia 07/12/2017  . Fracture of phalanx of finger 05/31/2017  . Impingement syndrome of shoulder region 05/31/2017  . Constipation 12/15/2016  . Hemorrhoids 12/15/2016  . Hypothyroidism 10/03/2016  . Chronic shoulder bursitis 08/30/2016  . Positive ANA (antinuclear antibody) 08/30/2016  . Bradycardia 06/16/2016  . History of adenomatous polyp of colon 05/02/2016  . Elevated liver enzymes 04/06/2016  . OSA (obstructive sleep apnea) 04/06/2016   . Bilateral shoulder pain 01/13/2016  . Fatty liver 12/08/2015  . Arthritis 12/08/2015  . History of bariatric surgery 12/07/2015  . Genital herpes 11/11/2015  . Hyperlipidemia 11/11/2015  . Essential hypertension 11/11/2015  . Routine physical examination 11/11/2015  . Allergic rhinitis 11/11/2015  . GERD (gastroesophageal reflux disease) 11/11/2015  . Herpesviral infection, unspecified 01/08/2014  . Disorder of thyroid, unspecified 01/08/2014  . Lumbar radiculitis 11/19/2013  . Neuritis or radiculitis due to rupture of lumbar intervertebral disc 11/19/2013  . Monilial vaginitis 08/20/2013  . Gastritis and duodenitis 07/25/2013  . Impaired fasting glucose 07/17/2012  . Obesity, unspecified 07/17/2012  . Obesity 07/17/2012  . Anxiety and depression 03/15/2012  . Abnormal electrocardiogram (ECG) (EKG) 03/06/2012  . URI (upper respiratory infection) 01/16/2012   PCP:  [name], FNP Pharmacy:   [facility] [id number], [place] [address] Phone: [phone] Fax: [phone]     Social Determinants of Health (SDOH) Interventions    Readmission Risk Interventions No flowsheet data found.

## 2020-06-10 NOTE — Consult Note (Signed)
[facility] SURGICAL CONSULTATION NOTE (initial) - cpt: 56433   HISTORY OF PRESENT ILLNESS (HPI):  70 y.o. female presented to [facility] ED yesterday for evaluation of abdominal pain. Patient reports that she has had about 24 hours of right upper quadrant abdominal pain with associated nausea and emesis. Abdominal pain is sharp in nature and radiates to her right shoulder. Of not, she did have laparoscopic cholecystectomy on 05/20 with Dr Darnell Level at [facility]. She tried prescribed pain medications but these were not effective secondary to her episodes of emesis. No fever, chills, cough, CP, SOB, or urinary changes. Only other previous intra-abdominal surgery is an abdominal hysterectomy. Initial laboratory work up in the ED was reassuring aside form a very mild leukocytosis to 11.2K. She initially had RUQ [name] which was reassuring. CT Abdomen/Pelvis did show a small fluid collection in the cholecystectomy bed and subsequent HIDA scan was obtained and confirmed bile leak. Her surgeon at [facility] was consulted but unable to accept as there were no beds at that facility. Case discussed with GI here who can follow for ERCP on 05/26. She was admitted tot he hospitalist service.   Surgery is consulted by emergency medicine physician Dr. Pryor Curia, DO in this context for evaluation and management of bile leak s/p cholecystectomy.  PAST MEDICAL HISTORY (PMH):  Past Medical History:  Diagnosis Date  . Anxiety   . Bariatric surgery status   . Constipation   . Dysrhythmia   . Elevated liver enzymes   . GERD (gastroesophageal reflux disease)   . Hemorrhoids   . Herpes genitalis   . High cholesterol   . Hyperlipidemia   . Hypertension   . Hypothyroidism   . Neuropathy   . Osteoarthritis   . Sleep apnea      PAST SURGICAL HISTORY [facility]):  Past Surgical History:  Procedure Laterality Date  . ABDOMINAL HYSTERECTOMY     total for fibroids no h/o abnormal pap  . bariatric sleeve  2015  .  BREAST EXCISIONAL BIOPSY Left 1998  . carpal tunnel repair    . COLONOSCOPY WITH PROPOFOL N/A 02/11/2016   Procedure: COLONOSCOPY WITH PROPOFOL;  Surgeon: Jonathon Bellows, MD;  Location: [facility] ENDOSCOPY;  Service: Endoscopy;  Laterality: N/A;  . COLONOSCOPY WITH PROPOFOL N/A 10/01/2019   Procedure: COLONOSCOPY WITH PROPOFOL;  Surgeon: Jonathon Bellows, MD;  Location: [facility] ENDOSCOPY;  Service: Gastroenterology;  Laterality: N/A;  . ESOPHAGOGASTRODUODENOSCOPY (EGD) WITH PROPOFOL N/A 12/02/2019   Procedure: ESOPHAGOGASTRODUODENOSCOPY (EGD) WITH PROPOFOL;  Surgeon: Jonathon Bellows, MD;  Location: [facility] ENDOSCOPY;  Service: Gastroenterology;  Laterality: N/A;  . HEMORRHOID SURGERY       MEDICATIONS:  Prior to Admission medications   Medication Sig Start Date End Date Taking? Authorizing Provider  acetaminophen (TYLENOL) 500 MG tablet Take by mouth.   Yes [provider]  acyclovir (ZOVIRAX) 400 MG tablet TAKE 1 TABLET BY MOUTH EVERY DAY 03/11/20  Yes [name], FNP  Ascorbic Acid (VITAMIN C) 1000 MG tablet Take 1,000 mg by mouth daily.   Yes [provider]  aspirin 81 MG tablet Take 81 mg by mouth every other day.   Yes [provider]  azelastine (ASTELIN) 0.1 % nasal spray USE 1 SPRAY EACH NOSTRIL TWICE A DAY AS NEEDED FOR ALLERGIES 08/09/18  Yes [provider]  famotidine (PEPCID) 20 MG tablet Take 1 tablet (20 mg total) by mouth at bedtime. 04/30/20  Yes [name], FNP  fexofenadine (ALLEGRA) 180 MG tablet TAKE 1 TABLET BY MOUTH  EVERY DAY 02/04/20  Yes Arnett, Yvetta Coder, FNP  fluticasone (FLONASE) 50 MCG/ACT nasal spray PLACE 1 SPRAY INTO BOTH NOSTRILS DAILY. 03/02/20  Yes [name], FNP  gabapentin (NEURONTIN) 100 MG capsule Take two tablet in the morning and two tablets midday PO. 02/25/20  Yes Arnett, Yvetta Coder, FNP  gabapentin (NEURONTIN) 300 MG capsule Take 1 capsule (300 mg total) by mouth at bedtime. 02/25/20  Yes [name], FNP   levothyroxine (SYNTHROID) 50 MCG tablet TAKE 1 TABLET BY MOUTH EVERY DAY 03/11/20  Yes [name], FNP  losartan (COZAAR) 100 MG tablet Take 1 tablet (100 mg total) by mouth at bedtime. 10/21/19  Yes [name], FNP  Multiple Vitamins-Minerals (BARIATRIC MULTIVITAMINS/IRON PO) Take by mouth.   Yes [provider]  omeprazole (PRILOSEC) 20 MG capsule Take 1 capsule (20 mg total) by mouth 2 (two) times daily before a meal. Patient taking differently: Take 20 mg by mouth every morning. 02/25/20  Yes Arnett, Yvetta Coder, FNP  ondansetron (ZOFRAN) 4 MG tablet Take 4 mg by mouth every 8 (eight) hours as needed. 06/05/20  Yes [provider]  oxyCODONE-acetaminophen (PERCOCET/ROXICET) 5-325 MG tablet Take 1 tablet by mouth every 4 (four) hours as needed. 06/05/20 06/10/20 Yes [provider]  traZODone (DESYREL) 100 MG tablet TAKE 1 TABLET BY MOUTH EVERYDAY AT BEDTIME 02/24/20  Yes [name], FNP  triamterene-hydrochlorothiazide (DYAZIDE) 37.5-25 MG capsule Take 1 each (1 capsule total) by mouth daily. 05/08/20  Yes Arnett, Yvetta Coder, FNP  hydrocortisone (ANUSOL-HC) 25 MG suppository Place 1 suppository (25 mg total) rectally 2 (two) times daily as needed for hemorrhoids or anal itching. 03/27/17   [name], FNP  Propylene Glycol (SYSTANE BALANCE) 0.6 % SOLN Apply to eye.    [provider]     ALLERGIES:  Allergies  Allergen Reactions  . Celecoxib Hives  . Pollen Extract   . Nasacort [Triamcinolone] Other (See Comments)    Nasal - Nose Bleeds  . Vicodin [Hydrocodone-Acetaminophen] Nausea Only     SOCIAL HISTORY:  Social History   Socioeconomic History  . Marital status: Married    Spouse name: Not on file  . Number of children: Not on file  . Years of education: Not on file  . Highest education level: Not on file  Occupational History  . Not on file  Tobacco Use  . Smoking status: Former Smoker    Packs/day: 1.50    Years:  24.00    Pack years: 36.00    Types: Cigarettes    Quit date: 1993    Years since quitting: 29.4  . Smokeless tobacco: Never Used  . Tobacco comment: quit 1995.   Vaping Use  . Vaping Use: Never used  Substance and Sexual Activity  . Alcohol use: Not Currently  . Drug use: No  . Sexual activity: Not Currently    Birth control/protection: Surgical    Comment: Hysterectomy  Other Topics Concern  . Not on file  Social History Narrative   Lives in [place].    Married.    Retired 2015, Interior and spatial designer.    One son; granddaughter.    Social Determinants of Health   Financial Resource Strain: Low Risk   . Difficulty of Paying Living Expenses: Not hard at all  Food Insecurity: No Food Insecurity  . Worried About Charity fundraiser in the Last Year: Never true  . Ran Out of Food in the Last Year:  Never true  Transportation Needs: No Transportation Needs  . Lack of Transportation (Medical): No  . Lack of Transportation (Non-Medical): No  Physical Activity: Insufficiently Active  . Days of Exercise per Week: 3 days  . Minutes of Exercise per Session: 20 min  Stress: No Stress Concern Present  . Feeling of Stress : Not at all  Social Connections: Unknown  . Frequency of Communication with Friends and Family: More than three times a week  . Frequency of Social Gatherings with Friends and Family: More than three times a week  . Attends Religious Services: Not on file  . Active Member of Clubs or Organizations: Not on file  . Attends Archivist Meetings: Not on file  . Marital Status: Not on file  Intimate Partner Violence: Not At Risk  . Fear of Current or Ex-Partner: No  . Emotionally Abused: No  . Physically Abused: No  . Sexually Abused: No     FAMILY HISTORY:  Family History  Problem Relation Age of Onset  . Breast cancer Sister 53       materal 1/2 sister  . Hypertension Sister   . Hypertension Mother   . Heart disease Father   . Hypertension  Brother       REVIEW OF SYSTEMS:  Review of Systems  Constitutional: Negative for chills and fever.  Respiratory: Negative for cough and shortness of breath.   Cardiovascular: Negative for chest pain and palpitations.  Gastrointestinal: Positive for abdominal pain, nausea and vomiting. Negative for constipation and diarrhea.  Genitourinary: Negative for dysuria and urgency.  Musculoskeletal: Positive for joint pain (Rigth shoulder). Negative for back pain.  All other systems reviewed and are negative.   VITAL SIGNS:  Temp:  [98 F (36.7 C)-98.5 F (36.9 C)] 98.1 F (36.7 C) (05/25 0435) Pulse Rate:  [66-88] 68 (05/25 0435) Resp:  [16-22] 16 (05/25 0435) BP: (126-158)/(57-87) 153/57 (05/25 0435) SpO2:  [90 %-100 %] 97 % (05/25 0435) Weight:  [81.6 kg] 81.6 kg (05/24 1523)     Height: 5\' 1"  (154.9 cm) Weight: 81.6 kg BMI (Calculated): 34.03   INTAKE/OUTPUT:  No intake/output data recorded.  PHYSICAL EXAM:  Physical Exam Vitals and nursing note reviewed. Exam conducted with a chaperone present.  Constitutional:      General: She is not in acute distress.    Appearance: She is well-developed. She is obese. She is not ill-appearing.  HENT:     Head: Normocephalic and atraumatic.  Eyes:     General: No scleral icterus.    Extraocular Movements: Extraocular movements intact.  Cardiovascular:     Rate and Rhythm: Normal rate and regular rhythm.     Heart sounds: Normal heart sounds.  Pulmonary:     Effort: Pulmonary effort is normal. No respiratory distress.  Abdominal:     General: A surgical scar is present. There is no distension.     Palpations: Abdomen is soft.     Tenderness: There is abdominal tenderness in the right upper quadrant and epigastric area. There is no guarding or rebound.     Comments: Abdomen is soft, RUQ/Epigastric tenderness, non-distended, no rebound/guarding   Genitourinary:    Comments: Deferred Skin:    General: Skin is warm and dry.      Coloration: Skin is not jaundiced.     Comments: Laparoscopic incisions are CDI, no erythema or drainage   Neurological:     General: No focal deficit present.     Mental Status: She is  alert and oriented to person, place, and time.  Psychiatric:        Mood and Affect: Mood normal.        Behavior: Behavior normal.      Labs:  CBC Latest Ref Rng & Units 06/10/2020 06/09/2020 03/10/2020  WBC 4.0 - 10.5 K/uL 9.6 11.2(H) 6.8  Hemoglobin 12.0 - 15.0 g/dL 12.2 11.5(L) 12.4  Hematocrit 36.0 - 46.0 % 37.0 35.8(L) 38.9  Platelets 150 - 400 K/uL 164 156 179   CMP Latest Ref Rng & Units 06/10/2020 06/09/2020 05/15/2020  Glucose 70 - 99 mg/dL 133(H) 130(H) 98  BUN 8 - 23 mg/dL 13 12 17   Creatinine 0.44 - 1.00 mg/dL 0.84 0.84 0.98  Sodium 135 - 145 mmol/L 141 140 141  Potassium 3.5 - 5.1 mmol/L 3.9 5.0 4.3  Chloride 98 - 111 mmol/L 105 104 104  CO2 22 - 32 mmol/L 27 23 30   Calcium 8.9 - 10.3 mg/dL 8.8(L) 8.5(L) 9.7  Total Protein 6.5 - 8.1 g/dL 6.6 6.6 6.8  Total Bilirubin 0.3 - 1.2 mg/dL 0.9 0.9 0.5  Alkaline Phos 38 - 126 U/L 63 54 66  AST 15 - 41 U/L 248(H) 47(H) 39(H)  ALT 0 - 44 U/L 53(H) 23 24     Imaging studies:   RUQ [name] (06/09/2020) personally reviewed s/p cholecystectomy, no acute findings, and radiologist report reviewed below:  IMPRESSION: Status post cholecystectomy. No other abnormality seen in the right upper quadrant of the abdomen.   CT Abdomen/Pelvis (06/09/2020) personally reviewed which shows a small fluid collection in gallbladder bed, otherwise no acute findings, and radiologist report reviewed below:  IMPRESSION: 1. Post recent cholecystectomy with trace ill-defined fluid in the cholecystectomy bed. Small amount of perihepatic and pelvic free fluid. This may be postoperative in nature. If there is clinical concern for bile leak, recommend further assessment with nuclear medicine hepatic biliary scan. No evidence of abscess. 2. Moderate colonic stool burden  with colonic redundancy, suggesting constipation. 3. Subcutaneous edema and skin thickening adjacent to the umbilicus is likely related to laparoscopic port site, superimposed infection not excluded.   HIDA (06/09/2020) personally reviewed concerning for bile leak, and radiologist report reviewed below:  IMPRESSION: Biliary leak arising from the level of the mid common bile duct with nonvisualization of the distal duct and duodenum.   Assessment/Plan: (ICD-10's: K33.8) 70 y.o. female with bile leak 5 days s/p laparosxcoic cholecystectomy at outside hospital.   - Appreciate medicine admission  - Agree with GI consultation; Plan for ERCP on Thursday (05/26) with Dr Allen Norris  - Nothing to add from surgical perspective. If ERCP unsuccessful then she will need transferred to [facility] or another tertiary center  - She should be okay for CLD today; await GI evaluation. Will need to be NPO at midnight   - Monitor abdominal examination  - Pain control prn; antiemetics prn   - Further management per primary service   - Nothing to add from surgical perspective currently. We will sign off. Please cal with questions or concerns.   All of the above findings and recommendations were discussed with the patient, and all of patient's questions were answered to her expressed satisfaction.  Thank you for the opportunity to participate in this patient's care.   -- Edison Simon, PA-C [facility] 06/10/2020, 7:30 AM [phone] M-F: 7am - 4pm

## 2020-06-10 NOTE — Progress Notes (Signed)
Patient oozed through dressing to abdomen (3 abd pads, papertape). Therefore instead of placing another gauze dressing. Placed an ostomy appliance to area. Cleansed area with alcohol and skin prep wipes. Placed appliance to site. Held up for an hour and then called back in room with news to change appliance due to bile going through adhesive. Removed device, cleansed area, and applied new appliance. Dry/intact. Patient resting in bed with call bell in reach. Did complain of chills, temp checked registered 99.5 orally. Gave tylenol 650mg . Patient seems better. Will give additional pain medication.

## 2020-06-11 ENCOUNTER — Encounter
Admission: EM | Disposition: A | Discharge status: Other Acute Inpatient Hospital | Payer: Self-pay | Source: Home / Self Care | Attending: Internal Medicine

## 2020-06-11 DIAGNOSIS — Z9884 Bariatric surgery status: Secondary | ICD-10-CM | POA: Diagnosis not present

## 2020-06-11 DIAGNOSIS — R188 Other ascites: Secondary | ICD-10-CM | POA: Diagnosis not present

## 2020-06-11 DIAGNOSIS — R935 Abnormal findings on diagnostic imaging of other abdominal regions, including retroperitoneum: Secondary | ICD-10-CM | POA: Diagnosis not present

## 2020-06-11 DIAGNOSIS — G629 Polyneuropathy, unspecified: Secondary | ICD-10-CM

## 2020-06-11 DIAGNOSIS — R1011 Right upper quadrant pain: Secondary | ICD-10-CM | POA: Diagnosis not present

## 2020-06-11 DIAGNOSIS — R109 Unspecified abdominal pain: Secondary | ICD-10-CM | POA: Diagnosis not present

## 2020-06-11 SURGERY — ENDOSCOPIC RETROGRADE CHOLANGIOPANCREATOGRAPHY (ERCP) WITH PROPOFOL
Anesthesia: General

## 2020-06-11 MED ORDER — SIMETHICONE 40 MG/0.6ML PO SUSP
40.0000 mg | Freq: Four times a day (QID) | ORAL | Status: DC | PRN
  Filled 2020-06-11 (×2): qty 0.6

## 2020-06-11 NOTE — Progress Notes (Deleted)
IR was requested for percutaneous drain placement for bile leak.   Case was reviewed by  Dr. Dwaine Gale, fluid collection too small and not drainable.   Ordering MD notified.  Please call IR for questions and concerns.   Armando Gang Ester Mabe PA-C 06/11/2020 8:41 AM

## 2020-06-11 NOTE — Progress Notes (Signed)
Patient set up for a procedure at [facility] for the bile duct leak.  Nothing more to offer from a GI point of view.  I will sign off.  Please call if any further GI concerns or questions.  We would like to thank you for the opportunity to participate in the care of [employer].

## 2020-06-11 NOTE — Progress Notes (Signed)
PROGRESS NOTE    Jennifer [name]  IOE:[id number] DOB: 1950-03-07 DOA: 06/09/2020 PCP: [name], FNP    Brief Narrative:  Jennifer [name] a 70 y.o.femalewith medical history significant forhypertension, dyslipidemia, hypothyroidism, sleep apnea, osteoarthritis GERD and anxiety, who presented to the ER with acute onset of right upper quadrant abdominal pain with associated vomiting and mild abdominal distention with radiation to her right shoulder. She has been having constipation. She had a lap cholecystectomy on Friday Concern for common bile duct biliary leak.  GI consulted.  Due to her previous type of gastric bypass unable to perform ERCP here.   5/26 pt seen and examined this am prior to transfer for day trip to [facility] for ERCP to be completed there.   Consultants:   GI  Procedures:   Antimicrobials:   zosyn    Subjective: No sob, no cp, no abd pain this am. Had leak from umbilicus but dressing in place  Objective: Vitals:   06/10/20 2332 06/11/20 0445 06/11/20 0723 06/11/20 0946  BP: (!) 113/57 (!) 104/56 101/60 (!) 105/55  Pulse: 82 85 79 73  Resp: 16 18 18 20   Temp: 98.9 F (37.2 C) 99.8 F (37.7 C) 99 F (37.2 C) 98.6 F (37 C)  TempSrc: Oral Oral Oral   SpO2: (!) 87% 91% 94% 95%  Weight:      Height:        Intake/Output Summary (Last 24 hours) at 06/11/2020 1739 Last data filed at 06/11/2020 1403 Gross per 24 hour  Intake 1996.88 ml  Output 20 ml  Net 1976.88 ml   Filed Weights   06/09/20 1523  Weight: 81.6 kg    Examination:  General exam: Appears calm and comfortable  Respiratory system: Clear to auscultation. Respiratory effort normal. Cardiovascular system: S1 & S2 heard, RRR. No JVD, murmurs, rubs, gallops or clicks.  Gastrointestinal system: Abdomen is nondistended, soft and nontender. Dressing in place in mid abd Central nervous system: Alert and oriented. No focal neurological deficits. Extremities: no  edema Skin:warm, dry Psychiatry: Judgement and insight appear normal. Mood & affect appropriate.     Data Reviewed: I have personally reviewed following labs and imaging studies  CBC: Recent Labs  Lab 06/09/20 1530 06/10/20 0453  WBC 11.2* 9.6  HGB 11.5* 12.2  HCT 35.8* 37.0  MCV 87.7 84.9  PLT 156 182   Basic Metabolic Panel: Recent Labs  Lab 06/09/20 1530 06/10/20 0453  NA 140 141  K 5.0 3.9  CL 104 105  CO2 23 27  GLUCOSE 130* 133*  BUN 12 13  CREATININE 0.84 0.84  CALCIUM 8.5* 8.8*   GFR: Estimated Creatinine Clearance: 60.3 mL/min (by C-G formula based on SCr of 0.84 mg/dL). Liver Function Tests: Recent Labs  Lab 06/09/20 1530 06/10/20 0453  AST 47* 248*  ALT 23 53*  ALKPHOS 54 63  BILITOT 0.9 0.9  PROT 6.6 6.6  ALBUMIN 3.9 3.9   Recent Labs  Lab 06/09/20 1530  LIPASE 21   No results for input(s): AMMONIA in the last 168 hours. Coagulation Profile: Recent Labs  Lab 06/10/20 0453  INR 1.1   Cardiac Enzymes: No results for input(s): CKTOTAL, CKMB, CKMBINDEX, TROPONINI in the last 168 hours. BNP (last 3 results) No results for input(s): PROBNP in the last 8760 hours. HbA1C: No results for input(s): HGBA1C in the last 72 hours. CBG: No results for input(s): GLUCAP in the last 168 hours. Lipid Profile: No results for input(s): CHOL, HDL, LDLCALC, TRIG, CHOLHDL, LDLDIRECT  in the last 72 hours. Thyroid Function Tests: No results for input(s): TSH, T4TOTAL, FREET4, T3FREE, THYROIDAB in the last 72 hours. Anemia Panel: No results for input(s): VITAMINB12, FOLATE, FERRITIN, TIBC, IRON, RETICCTPCT in the last 72 hours. Sepsis Labs: No results for input(s): PROCALCITON, LATICACIDVEN in the last 168 hours.  Recent Results (from the past 240 hour(s))  Resp Panel by RT-PCR (Flu A&B, Covid) Nasopharyngeal Swab     Status: None   Collection Time: 06/10/20 12:06 AM   Specimen: Nasopharyngeal Swab; Nasopharyngeal(NP) swabs in vial transport medium   Result Value Ref Range Status   SARS Coronavirus 2 by RT PCR NEGATIVE NEGATIVE Final    Comment: (NOTE) SARS-CoV-2 target nucleic acids are NOT DETECTED.  The SARS-CoV-2 RNA is generally detectable in upper respiratory specimens during the acute phase of infection. The lowest concentration of SARS-CoV-2 viral copies this assay can detect is 138 copies/mL. A negative result does not preclude SARS-Cov-2 infection and should not be used as the sole basis for treatment or other patient management decisions. A negative result may occur with  improper specimen collection/handling, submission of specimen other than nasopharyngeal swab, presence of viral mutation(s) within the areas targeted by this assay, and inadequate number of viral copies(<138 copies/mL). A negative result must be combined with clinical observations, patient history, and epidemiological information. The expected result is Negative.  Fact Sheet for Patients:  EntrepreneurPulse.com.au  Fact Sheet for Healthcare Providers:  IncredibleEmployment.be  This test is no t yet approved or cleared by the [place] FDA and  has been authorized for detection and/or diagnosis of SARS-CoV-2 by FDA under an Emergency Use Authorization (EUA). This EUA will remain  in effect (meaning this test can be used) for the duration of the COVID-19 declaration under Section 564(b)(1) of the Act, 21 U.S.C.section 360bbb-3(b)(1), unless the authorization is terminated  or revoked sooner.       Influenza A by PCR NEGATIVE NEGATIVE Final   Influenza B by PCR NEGATIVE NEGATIVE Final    Comment: (NOTE) The Xpert Xpress SARS-CoV-2/FLU/RSV plus assay is intended as an aid in the diagnosis of influenza from Nasopharyngeal swab specimens and should not be used as a sole basis for treatment. Nasal washings and aspirates are unacceptable for Xpert Xpress SARS-CoV-2/FLU/RSV testing.  Fact Sheet for  Patients: EntrepreneurPulse.com.au  Fact Sheet for Healthcare Providers: IncredibleEmployment.be  This test is not yet approved or cleared by the [place] FDA and has been authorized for detection and/or diagnosis of SARS-CoV-2 by FDA under an Emergency Use Authorization (EUA). This EUA will remain in effect (meaning this test can be used) for the duration of the COVID-19 declaration under Section 564(b)(1) of the Act, 21 U.S.C. section 360bbb-3(b)(1), unless the authorization is terminated or revoked.  Performed at [facility], [address], [place], [place] [address]          Radiology Studies: CT ABDOMEN PELVIS WO CONTRAST  Result Date: 06/09/2020 CLINICAL DATA:  Abdominal pain. Cholecystectomy 5 days ago with increasing pain. EXAM: CT ABDOMEN AND PELVIS WITHOUT CONTRAST TECHNIQUE: Multidetector CT imaging of the abdomen and pelvis was performed following the standard protocol without IV contrast. COMPARISON:  CT 05/24/2019.  Abdominal ultrasound earlier today. FINDINGS: Lower chest: Hypoventilatory changes in the lung bases. No pleural fluid. Hepatobiliary: Cholecystectomy. No biliary dilatation. Trace ill-defined fluid in the cholecystectomy bed. Small amount of perihepatic fluid. No evidence of focal hepatic lesion. Pancreas: No ductal dilatation or inflammation. Spleen: Normal in size without focal abnormality. Adrenals/Urinary Tract:  Normal adrenal glands. No renal stones or obstructive uropathy. No hydronephrosis. Partially distended urinary bladder. No evidence of focal renal lesion. Stomach/Bowel: Chain sutures about the greater curvature. There is also gastric bypass changes with gastrojejunostomy. Anastomotic sutures at the jejunal junction. No evidence of obstruction. Normal appendix. Moderate volume of stool throughout the colon with colonic redundancy. No colonic wall thickening or inflammation. Vascular/Lymphatic: Aortic  atherosclerosis. No abdominopelvic adenopathy. Reproductive: Hysterectomy without adnexal mass. Other: Small volume perihepatic free fluid measuring simple fluid density. Small amount of free fluid in the pelvis. Soft tissue edema involves the anterior abdominal wall adjacent to the umbilicus, likely laparoscopic port site. There is no confluent subcutaneous collection. Musculoskeletal: There are no acute or suspicious osseous abnormalities. Lower lumbar facet hypertrophy. IMPRESSION: 1. Post recent cholecystectomy with trace ill-defined fluid in the cholecystectomy bed. Small amount of perihepatic and pelvic free fluid. This may be postoperative in nature. If there is clinical concern for bile leak, recommend further assessment with nuclear medicine hepatic biliary scan. No evidence of abscess. 2. Moderate colonic stool burden with colonic redundancy, suggesting constipation. 3. Subcutaneous edema and skin thickening adjacent to the umbilicus is likely related to laparoscopic port site, superimposed infection not excluded. Aortic Atherosclerosis (ICD10-I70.0). Electronically Signed   By: Keith Rake M.D.   On: 06/09/2020 19:35   DG Chest Portable 1 View  Result Date: 06/09/2020 CLINICAL DATA:  Right upper quadrant pain radiating into the shoulder. EXAM: PORTABLE CHEST 1 VIEW COMPARISON:  PA and lateral chest 01/25/2013. FINDINGS: Lungs clear. Lung volumes are somewhat low. Heart size is normal. No pneumothorax or pleural fluid. No acute or focal bony abnormality. IMPRESSION: Negative chest. Electronically Signed   By: Inge Rise M.D.   On: 06/09/2020 19:20   NM HEPATOBILIARY LEAK (POST-SURGICAL)  Result Date: 06/09/2020 CLINICAL DATA:  Right upper quadrant and right lower quadrant abdominal pain. Cholecystectomy 5 days prior. Intra-abdominal fluid collection. EXAM: NUCLEAR MEDICINE HEPATOBILIARY IMAGING TECHNIQUE: Sequential images of the abdomen were obtained out to 60 minutes following  intravenous administration of radiopharmaceutical. RADIOPHARMACEUTICALS:  5.36 mCi Tc-68m  Choletec IV COMPARISON:  None. Findings are correlated with CT examination of 06/09/2020 FINDINGS: There is prompt uptake and clearance of radiotracer from the blood pool. There is prompt excretion of contrast into the biliary tree. The gallbladder is not identified in keeping with history of cholecystectomy. There is leakage of contrast from the expected level of the mid common duct with radiotracer pooling within the right mid abdomen and subsequently into the pelvis. There is no passage of radiotracer into the distal duct and duodenum. IMPRESSION: Biliary leak arising from the level of the mid common bile duct with nonvisualization of the distal duct and duodenum. Electronically Signed   By: Fidela Salisbury MD   On: 06/09/2020 23:39        Scheduled Meds: . acyclovir  400 mg Oral Daily  . vitamin C  1,000 mg Oral Daily  . azelastine  2 spray Each Nare BID  . enoxaparin (LOVENOX) injection  0.5 mg/kg Subcutaneous Q24H  . famotidine  20 mg Oral QHS  . fluticasone  1 spray Each Nare Daily  . gabapentin  200 mg Oral BID WC  . gabapentin  300 mg Oral QHS  . levothyroxine  50 mcg Oral Q0600  . loratadine  10 mg Oral Daily  . losartan  100 mg Oral QHS  . pantoprazole  40 mg Oral Daily  . triamterene-hydrochlorothiazide  1 tablet Oral Daily   Continuous Infusions: .  sodium chloride 100 mL/hr at 06/11/20 0651  . piperacillin-tazobactam Stopped (06/11/20 0900)    Assessment & Plan:   Active Problems:   Right upper quadrant abdominal pain   1.  Right upper quadrant abdominal pain with associated vomiting, concerning for common bile duct biliary leak.  The patient is status post laparoscopic cholecystectomy. Continue zosyn Plan to go to [facility] today for bile leak management by GI there GI here signed off  2.  GERD. Continue PPI and H2 blk  3.  Peripheral neuropathy. Continue neurontin  4.   Hypothyroidism. Continue synthroid  5.  Essential hypertension. - We will continue Dyazide    DVT prophylaxis: lovenox Code Status:full Family Communication: none at bedside  Status is: Inpatient  Remains inpatient appropriate because:Inpatient level of care appropriate due to severity of illness   Dispo: The patient is from: Home              Anticipated d/c is to: Home              Patient currently is not medically stable to d/c.   Difficult to place patient No            LOS: 1 day   Time spent: 35 min with >50% on coc    Nolberto Hanlon, MD [facility] Pager [phone]  If 7PM-7AM, please contact night-coverage 06/11/2020, 5:39 PM

## 2020-06-11 NOTE — Progress Notes (Signed)
Patient transported to [facility] via EMS. Called and gave report to facility.

## 2020-06-11 NOTE — TOC Progression Note (Signed)
Transition of Care [facility]) - Progression Note    Patient Details  Name: Jennifer [name] MRN: [id number] Date of Birth: [birth year]  Transition of Care [facility]) CM/SW Contact  Shelbie Hutching, RN Phone Number: 06/11/2020, 8:58 AM  Clinical Narrative:    EMS to pick up patient at 1015 this morning for ERCP at [facility] GI procedures at 1145.  Bedside RN calling report.  Charge nurse at GI procedures contacted this am to confirm details.          Expected Discharge Plan and Services                                                 Social Determinants of Health (SDOH) Interventions    Readmission Risk Interventions No flowsheet data found.

## 2020-06-11 NOTE — Progress Notes (Addendum)
Received patient from [facility], transported via stretcher, accompanied by EMS personnel. VSS, c/o of upper abdominal pain 4/10. Medicated with toradol. IVF started. Umbilical lap site is open, draining bile, dressing changed. Will monitor.  At 2000, dressing is saturated with bile. Steri strips applied. Dressing changed and reinforced. Dr Damita Dunnings notifed. Advised to call GI. Dr. Bonna Gains informed of leaking bile from umbilical lap site. MD advised to call surgery. Dr. Damita Dunnings notified. MD messaged surgery. Kept pt on NPO at this time.  At 2100, saturated dressing removed. Site cleansed with iodine and skin prep. Ostomy appliance applied.   At 2300, Dr Damita Dunnings communicated with Dr Thomasenia Bottoms. Will consult IR . Okay to start pt with simethicone for gas pain.

## 2020-06-11 NOTE — Progress Notes (Signed)
Cross coverage note  Contacted due to bile leakage from patient's periumbilical incision from recent laparoscopic cholecystectomy.  Patient went to [facility] for biliary stent placement earlier in the day but continues to drain bile.  Spoke with Dr. Bonna Gains via secure chat who recommended consulting surgery.  Subsequently spoke with Dr. Adora Fridge of surgery who recommended IR consult for placement of percutaneous drain and also recommended to continue Zosyn. In the interim an ostomy bag was placed over the drainage site to collect bile. Patient vitals remained stable.  Has some discomfort which she describes as indigestion but otherwise does not have pain, fever, nausea or vomiting.  Biliary drainage from periumbilical incision - IR consult placed for percutaneous drainage - N.p.o. after midnight and SCDs to replace Lovenox for procedure in the a.m. - Continue Zosyn - Continue surgical comanagement

## 2020-06-11 NOTE — Progress Notes (Signed)
IR was requested for percutaneous drain placement for bile leak.   Case was reviewed on 5/26 by Dr. Serafina Royals, IR can offer percutaneous cholangiogram with internal/external biliary drain placement if patient is not a candidate for GI procedure due to history of gastric bypass.   Ordering MD notified, patient is going to [facility] for GI procedure today.   IR radiologist eval & mgmt order deleted.  Please call IR for questions and concerns.   Armando Gang Labaron Digirolamo PA-C 06/11/2020 10:56 AM

## 2020-06-11 NOTE — Plan of Care (Signed)
Pt continues to have copious amounts of bile from lap site. Dressing once applied are soiled within minutes. Ostomy not effect due to it breaks the seal against the skin. Female purewick placed at beginning of shift to assist in catching urine.  Problem: Education: Goal: Knowledge of General Education information will improve Description: Including pain rating scale, medication(s)/side effects and non-pharmacologic comfort measures Outcome: Progressing   Problem: Health Behavior/Discharge Planning: Goal: Ability to manage health-related needs will improve Outcome: Progressing   Problem: Clinical Measurements: Goal: Ability to maintain clinical measurements within normal limits will improve Outcome: Progressing Goal: Will remain free from infection Outcome: Progressing Goal: Diagnostic test results will improve Outcome: Progressing Goal: Respiratory complications will improve Outcome: Progressing Goal: Cardiovascular complication will be avoided Outcome: Progressing   Problem: Activity: Goal: Risk for activity intolerance will decrease Outcome: Progressing   Problem: Nutrition: Goal: Adequate nutrition will be maintained Outcome: Progressing   Problem: Coping: Goal: Level of anxiety will decrease Outcome: Progressing   Problem: Elimination: Goal: Will not experience complications related to bowel motility Outcome: Progressing Goal: Will not experience complications related to urinary retention Outcome: Progressing   Problem: Pain Managment: Goal: General experience of comfort will improve Outcome: Progressing   Problem: Safety: Goal: Ability to remain free from injury will improve Outcome: Progressing   Problem: Skin Integrity: Goal: Risk for impaired skin integrity will decrease Outcome: Progressing

## 2020-06-12 ENCOUNTER — Telehealth: Payer: Self-pay | Admitting: Family

## 2020-06-12 DIAGNOSIS — K9189 Other postprocedural complications and disorders of digestive system: Secondary | ICD-10-CM | POA: Diagnosis not present

## 2020-06-12 DIAGNOSIS — G589 Mononeuropathy, unspecified: Secondary | ICD-10-CM

## 2020-06-12 DIAGNOSIS — K5900 Constipation, unspecified: Secondary | ICD-10-CM | POA: Diagnosis not present

## 2020-06-12 DIAGNOSIS — K839 Disease of biliary tract, unspecified: Secondary | ICD-10-CM

## 2020-06-12 DIAGNOSIS — K838 Other specified diseases of biliary tract: Principal | ICD-10-CM

## 2020-06-12 DIAGNOSIS — I10 Essential (primary) hypertension: Secondary | ICD-10-CM | POA: Diagnosis not present

## 2020-06-12 DIAGNOSIS — E039 Hypothyroidism, unspecified: Secondary | ICD-10-CM

## 2020-06-12 DIAGNOSIS — Y833 Surgical operation with formation of external stoma as the cause of abnormal reaction of the patient, or of later complication, without mention of misadventure at the time of the procedure: Secondary | ICD-10-CM

## 2020-06-12 DIAGNOSIS — T8189XA Other complications of procedures, not elsewhere classified, initial encounter: Secondary | ICD-10-CM | POA: Diagnosis present

## 2020-06-12 DIAGNOSIS — Z79890 Hormone replacement therapy (postmenopausal): Secondary | ICD-10-CM | POA: Diagnosis not present

## 2020-06-12 DIAGNOSIS — R1011 Right upper quadrant pain: Secondary | ICD-10-CM | POA: Diagnosis not present

## 2020-06-12 DIAGNOSIS — Z791 Long term (current) use of non-steroidal anti-inflammatories (NSAID): Secondary | ICD-10-CM | POA: Diagnosis not present

## 2020-06-12 DIAGNOSIS — Z9884 Bariatric surgery status: Secondary | ICD-10-CM | POA: Diagnosis not present

## 2020-06-12 DIAGNOSIS — K832 Perforation of bile duct: Secondary | ICD-10-CM | POA: Diagnosis present

## 2020-06-12 LAB — CBC
HCT: 33 % — ABNORMAL LOW (ref 36.0–46.0)
Hemoglobin: 10.9 g/dL — ABNORMAL LOW (ref 12.0–15.0)
MCH: 28.5 pg (ref 26.0–34.0)
MCHC: 33 g/dL (ref 30.0–36.0)
MCV: 86.2 fL (ref 80.0–100.0)
Platelets: 136 10*3/uL — ABNORMAL LOW (ref 150–400)
RBC: 3.83 MIL/uL — ABNORMAL LOW (ref 3.87–5.11)
RDW: 15.6 % — ABNORMAL HIGH (ref 11.5–15.5)
WBC: 5.3 10*3/uL (ref 4.0–10.5)
nRBC: 0 % (ref 0.0–0.2)

## 2020-06-12 LAB — COMPREHENSIVE METABOLIC PANEL
ALT: 54 U/L — ABNORMAL HIGH (ref 0–44)
AST: 58 U/L — ABNORMAL HIGH (ref 15–41)
Albumin: 2.8 g/dL — ABNORMAL LOW (ref 3.5–5.0)
Alkaline Phosphatase: 99 U/L (ref 38–126)
Anion gap: 9 (ref 5–15)
BUN: 12 mg/dL (ref 8–23)
CO2: 23 mmol/L (ref 22–32)
Calcium: 8.4 mg/dL — ABNORMAL LOW (ref 8.9–10.3)
Chloride: 107 mmol/L (ref 98–111)
Creatinine, Ser: 0.92 mg/dL (ref 0.44–1.00)
GFR, Estimated: >60 mL/min (ref >60)
Glucose, Bld: 82 mg/dL (ref 70–99)
Potassium: 3.4 mmol/L — ABNORMAL LOW (ref 3.5–5.1)
Sodium: 139 mmol/L (ref 135–145)
Total Bilirubin: 2.3 mg/dL — ABNORMAL HIGH (ref 0.3–1.2)
Total Protein: 5.5 g/dL — ABNORMAL LOW (ref 6.5–8.1)

## 2020-06-12 MED ORDER — PIPERACILLIN-TAZOBACTAM 3.375 G IVPB 30 MIN: 3.3750 g | Freq: Three times a day (TID) | INTRAVENOUS | Status: DC

## 2020-06-12 MED ORDER — SODIUM CHLORIDE 0.9 % IV SOLN: 100.0000 mL | INTRAVENOUS | 0 refills | Status: DC

## 2020-06-12 MED ORDER — PANTOPRAZOLE SODIUM 40 MG PO TBEC: 40.0000 mg | DELAYED_RELEASE_TABLET | Freq: Every day | ORAL | Status: DC

## 2020-06-12 MED ORDER — ACYCLOVIR 200 MG PO CAPS: 400.0000 mg | ORAL_CAPSULE | Freq: Every day | ORAL | Status: DC

## 2020-06-12 MED ORDER — GABAPENTIN 100 MG PO CAPS: 200.0000 mg | ORAL_CAPSULE | Freq: Two times a day (BID) | ORAL | Status: DC

## 2020-06-12 MED ORDER — MORPHINE SULFATE (PF) 2 MG/ML IV SOLN: 2.0000 mg | INTRAVENOUS | 0 refills | Status: DC | PRN

## 2020-06-12 MED ORDER — ACETAMINOPHEN 325 MG PO TABS: 650.0000 mg | ORAL_TABLET | Freq: Four times a day (QID) | ORAL | Status: AC | PRN

## 2020-06-12 MED ORDER — LEVOTHYROXINE SODIUM 50 MCG PO TABS: 50.0000 ug | ORAL_TABLET | Freq: Every day | ORAL | Status: DC

## 2020-06-12 NOTE — Care Management Important Message (Signed)
Important Message  Patient Details  Name: Jennifer [name] MRN: [id number] Date of Birth: 04/01/1950   Medicare Important Message Given:  Yes  Patient sleeping.  Left a copy of the Important Message from Medicare on her bedside table to review at her convenience.  Juliann Pulse A Johndaniel Catlin 06/12/2020, 10:41 AM

## 2020-06-12 NOTE — Telephone Encounter (Signed)
I called patient back & she was wanting to just give [name] an update. Patient needs a stent put indue to her leaking bilirubin? Her duodenal switch was complicated & [facility] feels she would be better served to be where her physician who performed the surgery, Dr. Darnell Level is. She is trying to get in [facility] & Dr. Darnell Level had let patient know that no beds were available. She was transferred to [facility] yesterday for a procedure & Dr.Bruce had reccommended a surgeon there. She was brought back to [facility] & she is waiting now to see still if Dr.Bruce can get her in to [place]. Procedure at [facility] I am unsure if it was unsuccessful & patient stated that she was still leaking & needed a stent. I was slightly confused at all patient was saying due to just lack of knowledge of situation. She just wanted to give update.

## 2020-06-12 NOTE — Progress Notes (Signed)
Report called to receiving nurse at [place]. All questions answered at time of call.

## 2020-06-12 NOTE — Discharge Summary (Signed)
Jennifer [name] PFX:[id number] DOB: 1950/09/13 DOA: 06/09/2020  PCP: [name], FNP  Admit date: 06/09/2020 Discharge date: 06/12/2020  Admitted From: home Disposition:  [facility] hospital   Discharge Condition:Stable CODE STATUS:full  Diet recommendation: NPO Brief/Interim Summary: Per HPI: Jennifer [name] is a 70 y.o. female with medical history significant for hypertension, dyslipidemia, hypothyroidism, sleep apnea, osteoarthritis GERD and anxiety, who presented to the ER with acute onset of right upper quadrant abdominal pain with associated vomiting and mild abdominal distention with radiation to her right shoulder.  She has been having constipation.  She had a lap cholecystectomy on Friday.   No fever or chills.  No dysuria, oliguria or hematuria or flank pain. ED Course: When the patient came to the ER blood pressure was 151/70 with otherwise normal vital signs.  Labs revealed unremarkable BMP.  AST was elevated to 48 and ALT 53 with normal alk phos of 63 and normal bili.  CBC was within normal.  INR was 1.1 and PT 13.7.  Influenza antigens and COVID-19 PCR came back negative.  UA came back negative.  Abdominal pelvic CT scan revealed the following: 1. Post recent cholecystectomy with trace ill-defined fluid in the cholecystectomy bed. Small amount of perihepatic and pelvic free fluid. This may be postoperative in nature. If there is clinical concern for bile leak, recommend further assessment with nuclear medicine hepatic biliary scan. No evidence of abscess. 2. Moderate colonic stool burden with colonic redundancy, suggesting constipation. 3. Subcutaneous edema and skin thickening adjacent to the umbilicus is likely related to laparoscopic port site, superimposed infection not excluded  Patient was admitted to the hospital. GI was consulted for possible ERCP. However due to previous gastric bypass anatomy this could not be done at [facility] or [place]. Patient was  subsequently transferred to [facility] for the day for endoscopy on 5/26 to have stent placement for bile leak by DR. Barren.  The findings revealed widely patent duodenal switch, . A biloma was found cystoduodenostomy was performed into the collection with placement of 7 French by 5 cm double-pigtail.  It was recommended to perform CT scan of the abdomen with contrast in 1 month if the fluid collection has resolved can refer for EGD at that point for removal of stent.  Patient was then returned to [place].  She has been on IV Zosyn.  Plan is to transfer patient to [facility] under Dr. Synthia Innocent service for further evaluation.   1. Right upper quadrant abdominal pain with associated vomiting, concerning for common bile duct biliary leak. The patient is status post laparoscopic cholecystectomy. Continue zosyn As above.   2. GERD. Continue PPI   3. Peripheral neuropathy. Continue neurontin  4. Hypothyroidism. Continue synthroid  5. Essential hypertension. Hold bp meds as it is normotensive     Discharge Diagnoses:  Active Problems:   Right upper quadrant abdominal pain    Discharge Instructions  Discharge Instructions    Diet - low sodium heart healthy   Complete by: As directed    NPO   Increase activity slowly   Complete by: As directed    No wound care   Complete by: As directed    As above     Allergies as of 06/12/2020      Reactions   Celecoxib Hives   Pollen Extract    Nasacort [triamcinolone] Other (See Comments)   Nasal - Nose Bleeds   Vicodin [hydrocodone-acetaminophen] Nausea Only      Medication List    STOP  taking these medications   acyclovir 400 MG tablet Commonly known as: ZOVIRAX Replaced by: acyclovir 200 MG capsule   aspirin 81 MG tablet   azelastine 0.1 % nasal spray Commonly known as: ASTELIN   BARIATRIC MULTIVITAMINS/IRON PO   famotidine 20 MG tablet Commonly known as: PEPCID   fexofenadine 180 MG tablet Commonly  known as: ALLEGRA   fluticasone 50 MCG/ACT nasal spray Commonly known as: FLONASE   hydrocortisone 25 MG suppository Commonly known as: ANUSOL-HC   losartan 100 MG tablet Commonly known as: COZAAR   omeprazole 20 MG capsule Commonly known as: PRILOSEC Replaced by: pantoprazole 40 MG tablet   ondansetron 4 MG tablet Commonly known as: ZOFRAN   oxyCODONE-acetaminophen 5-325 MG tablet Commonly known as: PERCOCET/ROXICET   Systane Balance 0.6 % Soln Generic drug: Propylene Glycol   traZODone 100 MG tablet Commonly known as: DESYREL   triamterene-hydrochlorothiazide 37.5-25 MG capsule Commonly known as: Dyazide   vitamin C 1000 MG tablet     TAKE these medications   acetaminophen 325 MG tablet Commonly known as: TYLENOL Take 2 tablets (650 mg total) by mouth every 6 (six) hours as needed for mild pain (or Fever >/= 101). What changed:   medication strength  how much to take  when to take this  reasons to take this   acyclovir 200 MG capsule Commonly known as: ZOVIRAX Take 2 capsules (400 mg total) by mouth daily. Start taking on: Jun 13, 2020 Replaces: acyclovir 400 MG tablet   gabapentin 100 MG capsule Commonly known as: NEURONTIN Take 2 capsules (200 mg total) by mouth 2 (two) times daily with breakfast and lunch. Start taking on: Jun 13, 2020 What changed:   how much to take  how to take this  when to take this  additional instructions  Another medication with the same name was removed. Continue taking this medication, and follow the directions you see here.   levothyroxine 50 MCG tablet Commonly known as: SYNTHROID Take 1 tablet (50 mcg total) by mouth daily at 6 (six) AM. Start taking on: Jun 13, 2020 What changed: when to take this   morphine 2 MG/ML injection Inject 1 mL (2 mg total) into the vein every 4 (four) hours as needed.   pantoprazole 40 MG tablet Commonly known as: PROTONIX Take 1 tablet (40 mg total) by mouth daily. Start  taking on: Jun 13, 2020 Replaces: omeprazole 20 MG capsule   piperacillin-tazobactam 3-0.375 GM/50ML IVPB Commonly known as: ZOSYN Inject 50 mLs (3.375 g total) into the vein every 8 (eight) hours.   sodium chloride 0.9 % infusion Inject 100 mLs into the vein continuous.       Allergies  Allergen Reactions  . Celecoxib Hives  . Pollen Extract   . Nasacort [Triamcinolone] Other (See Comments)    Nasal - Nose Bleeds  . Vicodin [Hydrocodone-Acetaminophen] Nausea Only    Consultations:  GI, Gsx   Procedures/Studies: CT ABDOMEN PELVIS WO CONTRAST  Result Date: 06/09/2020 CLINICAL DATA:  Abdominal pain. Cholecystectomy 5 days ago with increasing pain. EXAM: CT ABDOMEN AND PELVIS WITHOUT CONTRAST TECHNIQUE: Multidetector CT imaging of the abdomen and pelvis was performed following the standard protocol without IV contrast. COMPARISON:  CT 05/24/2019.  Abdominal ultrasound earlier today. FINDINGS: Lower chest: Hypoventilatory changes in the lung bases. No pleural fluid. Hepatobiliary: Cholecystectomy. No biliary dilatation. Trace ill-defined fluid in the cholecystectomy bed. Small amount of perihepatic fluid. No evidence of focal hepatic lesion. Pancreas: No ductal dilatation or inflammation. Spleen: Normal  in size without focal abnormality. Adrenals/Urinary Tract: Normal adrenal glands. No renal stones or obstructive uropathy. No hydronephrosis. Partially distended urinary bladder. No evidence of focal renal lesion. Stomach/Bowel: Chain sutures about the greater curvature. There is also gastric bypass changes with gastrojejunostomy. Anastomotic sutures at the jejunal junction. No evidence of obstruction. Normal appendix. Moderate volume of stool throughout the colon with colonic redundancy. No colonic wall thickening or inflammation. Vascular/Lymphatic: Aortic atherosclerosis. No abdominopelvic adenopathy. Reproductive: Hysterectomy without adnexal mass. Other: Small volume perihepatic free  fluid measuring simple fluid density. Small amount of free fluid in the pelvis. Soft tissue edema involves the anterior abdominal wall adjacent to the umbilicus, likely laparoscopic port site. There is no confluent subcutaneous collection. Musculoskeletal: There are no acute or suspicious osseous abnormalities. Lower lumbar facet hypertrophy. IMPRESSION: 1. Post recent cholecystectomy with trace ill-defined fluid in the cholecystectomy bed. Small amount of perihepatic and pelvic free fluid. This may be postoperative in nature. If there is clinical concern for bile leak, recommend further assessment with nuclear medicine hepatic biliary scan. No evidence of abscess. 2. Moderate colonic stool burden with colonic redundancy, suggesting constipation. 3. Subcutaneous edema and skin thickening adjacent to the umbilicus is likely related to laparoscopic port site, superimposed infection not excluded. Aortic Atherosclerosis (ICD10-I70.0). Electronically Signed   By: Keith Rake M.D.   On: 06/09/2020 19:35   DG Cervical Spine Complete  Result Date: 05/19/2020 CLINICAL DATA:  Chronic neck pain EXAM: CERVICAL SPINE - COMPLETE 4+ VIEW COMPARISON:  None. FINDINGS: Cervical vertebral body heights and alignment are maintained. No prevertebral soft tissue swelling. Multilevel disc space narrowing with endplate osteophytes, greatest at C5-C6. There is facet and uncovertebral hypertrophy. IMPRESSION: Mild multilevel degenerative changes. Appearance is similar to the prior study. Electronically Signed   By: Macy Mis M.D.   On: 05/19/2020 09:37   DG Lumbar Spine Complete  Result Date: 05/19/2020 CLINICAL DATA:  Chronic midline lower back pain radiating into the right hip. EXAM: LUMBAR SPINE - COMPLETE 4+ VIEW COMPARISON:  07/04/2010 FINDINGS: No fracture or bone lesion.  No spondylolisthesis. Discs are relatively well maintained in height. There are small endplate osteophytes from L2 through L5. Facet degenerative  changes are noted in lower lumbar spine, L3 through L5 on the right and L4 on the left. Mild curvature, convex the right, apex at L2. Scattered atherosclerotic calcifications along the abdominal aorta, stable. IMPRESSION: 1. No fracture or acute finding. No spondylolisthesis or bone lesion. 2. Mild disc and facet changes as described, similar to the prior exam. Electronically Signed   By: Lajean Manes M.D.   On: 05/19/2020 09:37   DG Chest Portable 1 View  Result Date: 06/09/2020 CLINICAL DATA:  Right upper quadrant pain radiating into the shoulder. EXAM: PORTABLE CHEST 1 VIEW COMPARISON:  PA and lateral chest 01/25/2013. FINDINGS: Lungs clear. Lung volumes are somewhat low. Heart size is normal. No pneumothorax or pleural fluid. No acute or focal bony abnormality. IMPRESSION: Negative chest. Electronically Signed   By: Inge Rise M.D.   On: 06/09/2020 19:20   NM HEPATOBILIARY LEAK (POST-SURGICAL)  Result Date: 06/09/2020 CLINICAL DATA:  Right upper quadrant and right lower quadrant abdominal pain. Cholecystectomy 5 days prior. Intra-abdominal fluid collection. EXAM: NUCLEAR MEDICINE HEPATOBILIARY IMAGING TECHNIQUE: Sequential images of the abdomen were obtained out to 60 minutes following intravenous administration of radiopharmaceutical. RADIOPHARMACEUTICALS:  5.36 mCi Tc-45m Choletec IV COMPARISON:  None. Findings are correlated with CT examination of 06/09/2020 FINDINGS: There is prompt uptake and clearance of  radiotracer from the blood pool. There is prompt excretion of contrast into the biliary tree. The gallbladder is not identified in keeping with history of cholecystectomy. There is leakage of contrast from the expected level of the mid common duct with radiotracer pooling within the right mid abdomen and subsequently into the pelvis. There is no passage of radiotracer into the distal duct and duodenum. IMPRESSION: Biliary leak arising from the level of the mid common bile duct with  nonvisualization of the distal duct and duodenum. Electronically Signed   By: Fidela Salisbury MD   On: 06/09/2020 23:39   US Abdomen Limited RUQ (LIVER/GB)  Result Date: 06/09/2020 CLINICAL DATA:  Acute right upper quadrant pain after cholecystectomy 4 days ago. EXAM: ULTRASOUND ABDOMEN LIMITED RIGHT UPPER QUADRANT COMPARISON:  April 16, 2020. FINDINGS: Gallbladder: Status post cholecystectomy. Small amount of free fluid is seen in the gallbladder fossa which is expected postoperative finding. Common bile duct: Diameter: 4 mm which is within normal limits. Liver: No focal lesion identified. Within normal limits in parenchymal echogenicity. Portal vein is patent on color Doppler imaging with normal direction of blood flow towards the liver. Other: None. IMPRESSION: Status post cholecystectomy. No other abnormality seen in the right upper quadrant of the abdomen. Electronically Signed   By: Marijo Conception M.D.   On: 06/09/2020 16:52      Subjective: Feels abd distended. +flatus. Pain improved today  Discharge Exam: Vitals:   06/12/20 0752 06/12/20 1145  BP: 110/62 133/61  Pulse: 60 66  Resp: 17 17  Temp: 98.2 F (36.8 C) 98.6 F (37 C)  SpO2: 97% 97%   Vitals:   06/11/20 2359 06/12/20 0406 06/12/20 0752 06/12/20 1145  BP: (!) 104/57 110/60 110/62 133/61  Pulse: (!) 58 (!) 57 60 66  Resp: _0 Temp: 97.7 F (36.5 C) (!) 97.5 F (36.4 C) 98.2 F (36.8 C) 98.6 F (37 C)  TempSrc: Oral Oral    SpO2: 99% 99% 97% 97%  Weight:      Height:        General: Pt is alert, awake, not in acute distress Cardiovascular: RRR, S1/S2 +, no rubs, no gallops Respiratory: CTA bilaterally, no wheezing, no rhonchi Abdominal: Soft, +bs, distended. Drain in place  Extremities: no edema, no cyanosis    The results of significant diagnostics from this hospitalization (including imaging, microbiology, ancillary and laboratory) are listed below for reference.     Microbiology: Recent  Results (from the past 240 hour(s))  Resp Panel by RT-PCR (Flu A&B, Covid) Nasopharyngeal Swab     Status: None   Collection Time: 06/10/20 12:06 AM   Specimen: Nasopharyngeal Swab; Nasopharyngeal(NP) swabs in vial transport medium  Result Value Ref Range Status   SARS Coronavirus 2 by RT PCR NEGATIVE NEGATIVE Final    Comment: (NOTE) SARS-CoV-2 target nucleic acids are NOT DETECTED.  The SARS-CoV-2 RNA is generally detectable in upper respiratory specimens during the acute phase of infection. The lowest concentration of SARS-CoV-2 viral copies this assay can detect is 138 copies/mL. A negative result does not preclude SARS-Cov-2 infection and should not be used as the sole basis for treatment or other patient management decisions. A negative result may occur with  improper specimen collection/handling, submission of specimen other than nasopharyngeal swab, presence of viral mutation(s) within the areas targeted by this assay, and inadequate number of viral copies(<138 copies/mL). A negative result must be combined with clinical observations, patient history, and epidemiological information. The expected  result is Negative.  Fact Sheet for Patients:  EntrepreneurPulse.com.au  Fact Sheet for Healthcare Providers:  IncredibleEmployment.be  This test is no t yet approved or cleared by the [place] FDA and  has been authorized for detection and/or diagnosis of SARS-CoV-2 by FDA under an Emergency Use Authorization (EUA). This EUA will remain  in effect (meaning this test can be used) for the duration of the COVID-19 declaration under Section 564(b)(1) of the Act, 21 U.S.C.section 360bbb-3(b)(1), unless the authorization is terminated  or revoked sooner.       Influenza A by PCR NEGATIVE NEGATIVE Final   Influenza B by PCR NEGATIVE NEGATIVE Final    Comment: (NOTE) The Xpert Xpress SARS-CoV-2/FLU/RSV plus assay is intended as an aid in the  diagnosis of influenza from Nasopharyngeal swab specimens and should not be used as a sole basis for treatment. Nasal washings and aspirates are unacceptable for Xpert Xpress SARS-CoV-2/FLU/RSV testing.  Fact Sheet for Patients: EntrepreneurPulse.com.au  Fact Sheet for Healthcare Providers: IncredibleEmployment.be  This test is not yet approved or cleared by the [place] FDA and has been authorized for detection and/or diagnosis of SARS-CoV-2 by FDA under an Emergency Use Authorization (EUA). This EUA will remain in effect (meaning this test can be used) for the duration of the COVID-19 declaration under Section 564(b)(1) of the Act, 21 U.S.C. section 360bbb-3(b)(1), unless the authorization is terminated or revoked.  Performed at [facility], [address] [place], [place] [address]      Labs: BNP (last 3 results) No results for input(s): BNP in the last 8760 hours. Basic Metabolic Panel: Recent Labs  Lab 06/09/20 1530 06/10/20 0453 06/12/20 0633  NA 140 141 139  K 5.0 3.9 3.4*  CL 104 105 107  CO2 _0 GLUCOSE 130* 133* 82  BUN _1 CREATININE 0.84 0.84 0.92  CALCIUM 8.5* 8.8* 8.4*   Liver Function Tests: Recent Labs  Lab 06/09/20 1530 06/10/20 0453 06/12/20 0633  AST 47* 248* 58*  ALT 23 53* 54*  ALKPHOS 54 63 99  BILITOT 0.9 0.9 2.3*  PROT 6.6 6.6 5.5*  ALBUMIN 3.9 3.9 2.8*   Recent Labs  Lab 06/09/20 1530  LIPASE 21   No results for input(s): AMMONIA in the last 168 hours. CBC: Recent Labs  Lab 06/09/20 1530 06/10/20 0453 06/12/20 0633  WBC 11.2* 9.6 5.3  HGB 11.5* 12.2 10.9*  HCT 35.8* 37.0 33.0*  MCV 87.7 84.9 86.2  PLT 156 164 136*   Cardiac Enzymes: No results for input(s): CKTOTAL, CKMB, CKMBINDEX, TROPONINI in the last 168 hours. BNP: Invalid input(s): POCBNP CBG: No results for input(s): GLUCAP in the last 168 hours. D-Dimer No results for input(s): DDIMER in the  last 72 hours. Hgb A1c No results for input(s): HGBA1C in the last 72 hours. Lipid Profile No results for input(s): CHOL, HDL, LDLCALC, TRIG, CHOLHDL, LDLDIRECT in the last 72 hours. Thyroid function studies No results for input(s): TSH, T4TOTAL, T3FREE, THYROIDAB in the last 72 hours.  Invalid input(s): FREET3 Anemia work up No results for input(s): VITAMINB12, FOLATE, FERRITIN, TIBC, IRON, RETICCTPCT in the last 72 hours. Urinalysis    Component Value Date/Time   COLORURINE YELLOW (A) 06/10/2020 0000   APPEARANCEUR CLEAR (A) 06/10/2020 0000   LABSPEC 1.023 06/10/2020 0000   PHURINE 6.0 06/10/2020 0000   GLUCOSEU NEGATIVE 06/10/2020 0000   GLUCOSEU NEGATIVE 03/03/2017 0830   HGBUR NEGATIVE 06/10/2020 0000   BILIRUBINUR NEGATIVE 06/10/2020 0000  KETONESUR NEGATIVE 06/10/2020 0000   PROTEINUR NEGATIVE 06/10/2020 0000   UROBILINOGEN 0.2 03/03/2017 0830   NITRITE NEGATIVE 06/10/2020 0000   LEUKOCYTESUR NEGATIVE 06/10/2020 0000   Sepsis Labs Invalid input(s): PROCALCITONIN,  WBC,  LACTICIDVEN Microbiology Recent Results (from the past 240 hour(s))  Resp Panel by RT-PCR (Flu A&B, Covid) Nasopharyngeal Swab     Status: None   Collection Time: 06/10/20 12:06 AM   Specimen: Nasopharyngeal Swab; Nasopharyngeal(NP) swabs in vial transport medium  Result Value Ref Range Status   SARS Coronavirus 2 by RT PCR NEGATIVE NEGATIVE Final    Comment: (NOTE) SARS-CoV-2 target nucleic acids are NOT DETECTED.  The SARS-CoV-2 RNA is generally detectable in upper respiratory specimens during the acute phase of infection. The lowest concentration of SARS-CoV-2 viral copies this assay can detect is 138 copies/mL. A negative result does not preclude SARS-Cov-2 infection and should not be used as the sole basis for treatment or other patient management decisions. A negative result may occur with  improper specimen collection/handling, submission of specimen other than nasopharyngeal swab,  presence of viral mutation(s) within the areas targeted by this assay, and inadequate number of viral copies(<138 copies/mL). A negative result must be combined with clinical observations, patient history, and epidemiological information. The expected result is Negative.  Fact Sheet for Patients:  EntrepreneurPulse.com.au  Fact Sheet for Healthcare Providers:  IncredibleEmployment.be  This test is no t yet approved or cleared by the [place] FDA and  has been authorized for detection and/or diagnosis of SARS-CoV-2 by FDA under an Emergency Use Authorization (EUA). This EUA will remain  in effect (meaning this test can be used) for the duration of the COVID-19 declaration under Section 564(b)(1) of the Act, 21 U.S.C.section 360bbb-3(b)(1), unless the authorization is terminated  or revoked sooner.       Influenza A by PCR NEGATIVE NEGATIVE Final   Influenza B by PCR NEGATIVE NEGATIVE Final    Comment: (NOTE) The Xpert Xpress SARS-CoV-2/FLU/RSV plus assay is intended as an aid in the diagnosis of influenza from Nasopharyngeal swab specimens and should not be used as a sole basis for treatment. Nasal washings and aspirates are unacceptable for Xpert Xpress SARS-CoV-2/FLU/RSV testing.  Fact Sheet for Patients: EntrepreneurPulse.com.au  Fact Sheet for Healthcare Providers: IncredibleEmployment.be  This test is not yet approved or cleared by the [place] FDA and has been authorized for detection and/or diagnosis of SARS-CoV-2 by FDA under an Emergency Use Authorization (EUA). This EUA will remain in effect (meaning this test can be used) for the duration of the COVID-19 declaration under Section 564(b)(1) of the Act, 21 U.S.C. section 360bbb-3(b)(1), unless the authorization is terminated or revoked.  Performed at [facility], [address], [place], [place] [address]      Time  coordinating discharge: Over 30 minutes  SIGNED:   Nolberto Hanlon, MD  [facility] 06/12/2020, 12:57 PM Pager   If 7PM-7AM, please contact night-coverage [email or web address] Password [email or web address]

## 2020-06-12 NOTE — TOC Transition Note (Signed)
Transition of Care [facility]) - CM/SW Discharge Note   Patient Details  Name: Jennifer [name] MRN: [id number] Date of Birth: October 18, 1950  Transition of [facility] CM/SW Contact:  Shelbie Hutching, RN Phone Number: 06/12/2020, 1:21 PM   Clinical Narrative:    Patient will transfer over to [facility] today.    Final next level of care: Acute to Acute Transfer Barriers to Discharge: No Barriers Identified   Patient Goals and CMS Choice        Discharge Placement                Patient to be transferred to facility by: [facility] Name of family member notified: Husband Charles Patient and family notified of of transfer: 06/12/20  Discharge Plan and Services                                     Social Determinants of Health (SDOH) Interventions     Readmission Risk Interventions No flowsheet data found.

## 2020-06-12 NOTE — Telephone Encounter (Signed)
Patient in hospital wanted to talk to [facility] or [place]

## 2020-06-12 NOTE — Progress Notes (Addendum)
06/12/2020  Subjective: Patient went to [facility] yesterday for possible ERCP with Dr. Okey Dupre.  I reviewed the procedure note on [facility]'s system.  The left hepatic ducts were too small for hepaticogastrostomy.  Instead, he did a cystoduodenostomy and placed a double pigtail drain from the bile fluid collection to the duodenum to internalize the drainage.  She was transferred back afterwards.  She continued having significant bilious output from her umbilical incision.  This is managed with ostomy pouch.    Vital signs: Temp:  [97.5 F (36.4 C)-98.6 F (37 C)] 98.6 F (37 C) (05/27 1145) Pulse Rate:  [57-66] 66 (05/27 1145) Resp:  [16-18] 17 (05/27 1145) BP: (104-133)/(57-76) 133/61 (05/27 1145) SpO2:  [97 %-99 %] 97 % (05/27 1145)   Intake/Output: 05/26 0701 - 05/27 0700 In: 1347.5 [P.O.:150; I.V.:1152.6; IV Piggyback:44.9] Out: 150  Last BM Date: 06/08/20  Physical Exam: Constitutional:  No acute distress Abdomen:  Soft, mildly distended, non-tender to palpation.  Umbilical incision with bilious drainage and with ostomy pouch placed for drainage collection.  No peritonitis.  Labs:  Recent Labs    06/10/20 0453 06/12/20 0633  WBC 9.6 5.3  HGB 12.2 10.9*  HCT 37.0 33.0*  PLT 164 136*   Recent Labs    06/10/20 0453 06/12/20 0633  NA 141 139  K 3.9 3.4*  CL 105 107  CO2 27 23  GLUCOSE 133* 82  BUN 13 12  CREATININE 0.84 0.92  CALCIUM 8.8* 8.4*   Recent Labs    06/10/20 0453  LABPROT 13.7  INR 1.1    Imaging: No results found.  Assessment/Plan: This is a 70 y.o. female s/p laparoscopic cholecystectomy with bile leak.  --Case discussed by phone with Dr. Darnell Level who is her original surgeon, and he explained the procedure done by Dr. Okey Dupre better for my own understanding.  The patient still has bilious drainage from her umbilicus, and my initial concern was that the bile leak itself had not been addressed and she could possibly need common bile duct exploration.  Dr. Darnell Level  explained the situation better and she does not need any surgery at this point.  However, he did offer to have the patient transferred to [place] to his service for further management and follow up.  I think her scenario is complex enough that it would be better managed at her original hospital and surgical team.   --Dr. Darnell Level has been able to arrange the transfer and has communicated to Dr. Kurtis Bushman as well.  She will be transferred today. --For now, no other procedures are needed, and no IR drainage or MRCP is needed at this point.  Face-to-face time spent with the patient and care providers was 35 minutes, with more than 50% of the time spent counseling, educating, and coordinating care of the patient.     Melvyn Neth, [facility]

## 2020-06-16 NOTE — Telephone Encounter (Signed)
Call pt Appreciate update and she has been through a lot  She is scheduled to see me in July and we can discuss all in great detail If she needs anything prior too, please let me know

## 2020-06-16 NOTE — Telephone Encounter (Signed)
Patient has been notified

## 2020-06-23 DIAGNOSIS — R109 Unspecified abdominal pain: Secondary | ICD-10-CM | POA: Diagnosis not present

## 2020-06-24 DIAGNOSIS — Z9049 Acquired absence of other specified parts of digestive tract: Secondary | ICD-10-CM | POA: Diagnosis not present

## 2020-06-24 DIAGNOSIS — I739 Peripheral vascular disease, unspecified: Secondary | ICD-10-CM | POA: Diagnosis present

## 2020-06-24 DIAGNOSIS — I10 Essential (primary) hypertension: Secondary | ICD-10-CM | POA: Diagnosis present

## 2020-06-24 DIAGNOSIS — G894 Chronic pain syndrome: Secondary | ICD-10-CM | POA: Diagnosis present

## 2020-06-24 DIAGNOSIS — F419 Anxiety disorder, unspecified: Secondary | ICD-10-CM | POA: Diagnosis present

## 2020-06-24 DIAGNOSIS — T8189XA Other complications of procedures, not elsewhere classified, initial encounter: Secondary | ICD-10-CM | POA: Diagnosis present

## 2020-06-24 DIAGNOSIS — K838 Other specified diseases of biliary tract: Secondary | ICD-10-CM | POA: Diagnosis not present

## 2020-06-24 DIAGNOSIS — Z833 Family history of diabetes mellitus: Secondary | ICD-10-CM | POA: Diagnosis not present

## 2020-06-24 DIAGNOSIS — R109 Unspecified abdominal pain: Secondary | ICD-10-CM | POA: Diagnosis not present

## 2020-06-24 DIAGNOSIS — Z8261 Family history of arthritis: Secondary | ICD-10-CM | POA: Diagnosis not present

## 2020-06-24 DIAGNOSIS — K567 Ileus, unspecified: Secondary | ICD-10-CM | POA: Diagnosis present

## 2020-06-24 DIAGNOSIS — K839 Disease of biliary tract, unspecified: Secondary | ICD-10-CM | POA: Diagnosis present

## 2020-06-24 DIAGNOSIS — K9189 Other postprocedural complications and disorders of digestive system: Secondary | ICD-10-CM | POA: Diagnosis not present

## 2020-06-24 DIAGNOSIS — E039 Hypothyroidism, unspecified: Secondary | ICD-10-CM | POA: Diagnosis present

## 2020-06-24 DIAGNOSIS — K832 Perforation of bile duct: Secondary | ICD-10-CM | POA: Diagnosis not present

## 2020-06-24 DIAGNOSIS — K219 Gastro-esophageal reflux disease without esophagitis: Secondary | ICD-10-CM | POA: Diagnosis present

## 2020-06-24 DIAGNOSIS — M1990 Unspecified osteoarthritis, unspecified site: Secondary | ICD-10-CM | POA: Diagnosis present

## 2020-06-24 DIAGNOSIS — G609 Hereditary and idiopathic neuropathy, unspecified: Secondary | ICD-10-CM | POA: Diagnosis not present

## 2020-06-24 DIAGNOSIS — F32A Depression, unspecified: Secondary | ICD-10-CM | POA: Diagnosis present

## 2020-06-24 DIAGNOSIS — G629 Polyneuropathy, unspecified: Secondary | ICD-10-CM | POA: Diagnosis present

## 2020-06-24 DIAGNOSIS — E871 Hypo-osmolality and hyponatremia: Secondary | ICD-10-CM | POA: Diagnosis present

## 2020-06-24 DIAGNOSIS — D649 Anemia, unspecified: Secondary | ICD-10-CM | POA: Diagnosis present

## 2020-06-24 DIAGNOSIS — Z825 Family history of asthma and other chronic lower respiratory diseases: Secondary | ICD-10-CM | POA: Diagnosis not present

## 2020-06-24 DIAGNOSIS — Z20822 Contact with and (suspected) exposure to covid-19: Secondary | ICD-10-CM | POA: Diagnosis present

## 2020-06-24 DIAGNOSIS — Z90710 Acquired absence of both cervix and uterus: Secondary | ICD-10-CM | POA: Diagnosis not present

## 2020-06-24 DIAGNOSIS — Z87891 Personal history of nicotine dependence: Secondary | ICD-10-CM | POA: Diagnosis not present

## 2020-06-24 DIAGNOSIS — R188 Other ascites: Secondary | ICD-10-CM | POA: Diagnosis present

## 2020-06-24 DIAGNOSIS — G4733 Obstructive sleep apnea (adult) (pediatric): Secondary | ICD-10-CM | POA: Diagnosis not present

## 2020-06-24 DIAGNOSIS — E7800 Pure hypercholesterolemia, unspecified: Secondary | ICD-10-CM | POA: Diagnosis present

## 2020-06-24 DIAGNOSIS — Z6833 Body mass index (BMI) 33.0-33.9, adult: Secondary | ICD-10-CM | POA: Diagnosis not present

## 2020-06-26 ENCOUNTER — Telehealth: Payer: Self-pay | Admitting: Family

## 2020-06-26 NOTE — Telephone Encounter (Signed)
Pt would like a call back to go over her recent Gallbladder surgery and to let Joycelyn Schmid now that she is back in the hospital

## 2020-07-01 NOTE — Telephone Encounter (Signed)
LMTCB

## 2020-07-02 DIAGNOSIS — K833 Fistula of bile duct: Secondary | ICD-10-CM | POA: Diagnosis not present

## 2020-07-02 DIAGNOSIS — K9189 Other postprocedural complications and disorders of digestive system: Secondary | ICD-10-CM | POA: Diagnosis not present

## 2020-07-02 DIAGNOSIS — E785 Hyperlipidemia, unspecified: Secondary | ICD-10-CM | POA: Diagnosis not present

## 2020-07-02 DIAGNOSIS — K838 Other specified diseases of biliary tract: Secondary | ICD-10-CM | POA: Insufficient documentation

## 2020-07-08 ENCOUNTER — Telehealth: Payer: Self-pay

## 2020-07-08 NOTE — Telephone Encounter (Signed)
Pt called and she stated that she was having numbness in her right thigh since she was in the hospital. She has bounced back & forth quiet a lot ending up at [facility]. She was released Sunday & was given the number to a neurologist in [place] to call if she was having any more issues after discharge. She wanted to know who she could talk to or see here instead of going there. She wanted a referral placed. I explained that you were on vacation & given her complexity she should call neurologist with whom she saw in the hospital that she had contact info for. She wanted to initially be upset that her PCP couldn't talk to her when she was having issues or emergencies. I explained to her that she had more going on that could be handled in one office & she had been at a bigger academic center getting the best care that she could. She had been at the right places. I also advised her that is why the ED is in place. I told patient that she should pursue seeing neurologist in [facility] for whom she had the card at least at first. If they felt afterward she could be seen here then referral could be placed. She follows up with you 7/15 & I advised to discuss then. Pt understood & stated that she would call about getting an appointment with neurologist she saw at [facility]. She knew that you would be back Monday & were not here to place a referral.

## 2020-07-10 NOTE — Telephone Encounter (Signed)
No appointments available except [facility] same day. [facility] L[facility] not sure if she will be back so nervous to schedule. Please advise of okay to use a 9:30 of yours?

## 2020-07-10 NOTE — Telephone Encounter (Signed)
Do we have any sooner appts ? Any cancellations so we can discuss right thigh numbness?  Or if acute , can look at colleagues availability next week and offer to her.  Please let me know what you can find for her

## 2020-07-15 ENCOUNTER — Ambulatory Visit (INDEPENDENT_AMBULATORY_CARE_PROVIDER_SITE_OTHER): Payer: Medicare Other | Admitting: Family

## 2020-07-15 ENCOUNTER — Encounter: Payer: Self-pay | Admitting: Family

## 2020-07-15 ENCOUNTER — Other Ambulatory Visit: Payer: Self-pay

## 2020-07-15 VITALS — BP 114/68 | HR 82 | Temp 98.2°F | Ht 61.0 in | Wt 168.4 lb

## 2020-07-15 DIAGNOSIS — G629 Polyneuropathy, unspecified: Secondary | ICD-10-CM

## 2020-07-15 DIAGNOSIS — K838 Other specified diseases of biliary tract: Secondary | ICD-10-CM | POA: Diagnosis not present

## 2020-07-15 MED ORDER — GABAPENTIN 300 MG PO CAPS: 300.0000 mg | ORAL_CAPSULE | Freq: Three times a day (TID) | ORAL | 3 refills | Status: DC

## 2020-07-15 NOTE — Patient Instructions (Signed)
Increase gabapentin to 300mg  three times daily Referral to neurology  Let us know if you dont hear back within a week in regards to an appointment being scheduled.

## 2020-07-15 NOTE — Progress Notes (Signed)
Subjective:    Patient ID: Jennifer [name], female    DOB: Jul 20, 1950, 70 y.o.   MRN: [id number]  CC: [name] is a 70 y.o. female who presents today for follow up.   HPI: Primary concern is right thigh lateral numbness the size of hand print, 4 weeks.Constant. No hip or groin, low back pain. Describes as sensitivity.  No pain in right leg when walking. No swelling, rash. No falls, injury.   Notes that surgical drains were placed through right groin.   She would like referral to neurology locally.   She reports she is taking gabapentin 200mg  morning , noon, and 300mg  qhs which she has been taking for some time for peripheral neuropathy without noticeable relief of right thigh.   No vision loss, HA, vibratory sense of legs.    B12 1500 2 weeks ago TSH 1.76   Since hospitalization, she reports that she is feeling better.  Energy remains low. Abdominal pain has resolved. No constipation. She is eating fruit, yogurt, sausage. Drinking water.   Hospitalized for abdominal pain and ileus, first at [facility] 06/09/20.   Ultimately transferred to [facility]  for antegrade stent placement. MRI with MRCP demonstrated biliary leak. Percutaneous bilary drain placed by IR. Currently has two surgical drains.   Following with Dr Darnell Level, Dr Manuella Ghazi at [facility]. She sees Dr Manuella Ghazi next week , and she hopes drains to be removed.   Seen by [facility] attending neurology, Dr Deon Pilling, during hospital course 06/28/20 and started gabapentin 300mg  8am, 2pm, and 8pm. Plan to see Dr Derrill Kay after discharge for EMG testing to determine extent of neurology and severity.    05/18/20  Xr lumbar - No fracture or acute finding. No spondylolisthesis or bone lesion. 2. Mild disc and facet changes as described, similar to the prior exam.  Xr cervical- Mild multilevel degenerative changes. Appearance is similar to the prior study.  HISTORY:  Past Medical History:  Diagnosis Date   Anxiety    Bariatric surgery  status    Constipation    Dysrhythmia    Elevated liver enzymes    GERD (gastroesophageal reflux disease)    Hemorrhoids    Herpes genitalis    High cholesterol    Hyperlipidemia    Hypertension    Hypothyroidism    Neuropathy    Osteoarthritis    Sleep apnea    Past Surgical History:  Procedure Laterality Date   ABDOMINAL HYSTERECTOMY     total for fibroids no h/o abnormal pap   bariatric sleeve  2015   BREAST EXCISIONAL BIOPSY Left 1998   carpal tunnel repair     COLONOSCOPY WITH PROPOFOL N/A 02/11/2016   Procedure: COLONOSCOPY WITH PROPOFOL;  Surgeon: Jonathon Bellows, MD;  Location: [facility] ENDOSCOPY;  Service: Endoscopy;  Laterality: N/A;   COLONOSCOPY WITH PROPOFOL N/A 10/01/2019   Procedure: COLONOSCOPY WITH PROPOFOL;  Surgeon: Jonathon Bellows, MD;  Location: [facility] ENDOSCOPY;  Service: Gastroenterology;  Laterality: N/A;   ESOPHAGOGASTRODUODENOSCOPY (EGD) WITH PROPOFOL N/A 12/02/2019   Procedure: ESOPHAGOGASTRODUODENOSCOPY (EGD) WITH PROPOFOL;  Surgeon: Jonathon Bellows, MD;  Location: [facility] ENDOSCOPY;  Service: Gastroenterology;  Laterality: N/A;   HEMORRHOID SURGERY     Family History  Problem Relation Age of Onset   Breast cancer Sister 52       materal 1/2 sister   Hypertension Sister    Hypertension Mother    Heart disease Father    Hypertension Brother     Allergies: Celecoxib, Hydrocodone, Pollen extract, Nasacort [triamcinolone], and Vicodin [  hydrocodone-acetaminophen] Current Outpatient Medications on File Prior to Visit  Medication Sig Dispense Refill   acetaminophen (TYLENOL) 325 MG tablet Take 2 tablets (650 mg total) by mouth every 6 (six) hours as needed for mild pain (or Fever >/= 101).     acyclovir (ZOVIRAX) 400 MG tablet Take 400 mg by mouth daily.     ALLERGY RELIEF 180 MG tablet Take 180 mg by mouth daily.     famotidine (PEPCID) 20 MG tablet Take 1 tablet by mouth at bedtime as needed.     levothyroxine (SYNTHROID) 50 MCG tablet Take 1 tablet (50 mcg total) by  mouth daily at 6 (six) AM.     triamterene-hydrochlorothiazide (MAXZIDE-25) 37.5-25 MG tablet Take 1 tablet by mouth daily.     No current facility-administered medications on file prior to visit.    Social History   Tobacco Use   Smoking status: Former    Packs/day: 1.50    Years: 24.00    Pack years: 36.00    Types: Cigarettes    Quit date: 1993    Years since quitting: 29.5   Smokeless tobacco: Never   Tobacco comments:    quit 1995.   Vaping Use   Vaping Use: Never used  Substance Use Topics   Alcohol use: Not Currently   Drug use: No    Review of Systems  Constitutional:  Negative for chills and fever.  Eyes:  Negative for visual disturbance.  Respiratory:  Negative for cough.   Cardiovascular:  Negative for chest pain and palpitations.  Gastrointestinal:  Negative for abdominal pain, nausea and vomiting.  Musculoskeletal:  Negative for back pain.  Neurological:  Positive for numbness. Negative for headaches.     Objective:    BP 114/68 (BP Location: Left Arm, Patient Position: Sitting, Cuff Size: Large)   Pulse 82   Temp 98.2 F (36.8 C) (Oral)   Ht 5\' 1"  (1.549 m)   Wt 168 lb 6.4 oz (76.4 kg)   SpO2 98%   BMI 31.82 kg/m  BP Readings from Last 3 Encounters:  07/15/20 114/68  06/12/20 (!) 122/59  05/18/20 130/74   Wt Readings from Last 3 Encounters:  07/15/20 168 lb 6.4 oz (76.4 kg)  06/09/20 180 lb (81.6 kg)  05/18/20 178 lb 6.4 oz (80.9 kg)    Physical Exam Vitals reviewed.  Constitutional:      Appearance: She is well-developed.  Eyes:     Conjunctiva/sclera: Conjunctivae normal.  Cardiovascular:     Rate and Rhythm: Normal rate and regular rhythm.     Pulses: Normal pulses.     Heart sounds: Normal heart sounds.  Pulmonary:     Effort: Pulmonary effort is normal.     Breath sounds: Normal breath sounds. No wheezing, rhonchi or rales.  Musculoskeletal:     Lumbar back: No swelling, edema, spasms, tenderness or bony tenderness. Normal  range of motion.       Legs:     Comments: Right lateral thigh area as marked on diagram. Sensation intact with microfilament test however sensation different when compared to left. Extremity warm. No edema.  Full range of motion with flexion, tension, lateral side bends. No bony tenderness. No pain, numbness, tingling elicited with single leg raise bilaterally.   Skin:    General: Skin is warm and dry.  Neurological:     Mental Status: She is alert.     Sensory: No sensory deficit.     Deep Tendon Reflexes:  Reflex Scores:      Patellar reflexes are 2+ on the right side and 2+ on the left side.    Comments: Strength intact bilateral lower extremities.  Psychiatric:        Speech: Speech normal.        Behavior: Behavior normal.        Thought Content: Thought content normal.       Assessment & Plan:   Problem List Items Addressed This Visit       Digestive   Common bile duct leak    Abdominal pain and constipation resolved. Patient  Unfortunately had very complicated course. She has follow up scheduled with both Dr Manuella Ghazi and Dr Darnell Level. Will follow.          Nervous and Auditory   Neuropathy - Primary    Unusual presentation as altered sensation in one area of right thigh. Agreed that neurology consult, EMG reasonable. Referral placed. Increase gabapentin to 300mg  tid.  Symptoms not consistent with vascular findings at this time however will consider in the near future. Upon review of lumbar xray, no stenosis apparent however she does have facet changes. If symptoms persistent , may consider MRI lumbar , MRI pelvis as well.        Relevant Medications   gabapentin (NEURONTIN) 300 MG capsule   Other Relevant Orders   Ambulatory referral to Neurology     I have discontinued [name] Roggenkamp's morphine, piperacillin-tazobactam, pantoprazole, gabapentin, and sodium chloride. I have also changed her gabapentin. Additionally, I am having her maintain her acetaminophen,  levothyroxine, acyclovir, famotidine, Allergy Relief, and triamterene-hydrochlorothiazide.   Meds ordered this encounter  Medications   gabapentin (NEURONTIN) 300 MG capsule    Sig: Take 1 capsule (300 mg total) by mouth 3 (three) times daily.    Dispense:  270 capsule    Refill:  3    Order Specific Question:   Supervising Provider    Answer:   Crecencio Mc [[id number]]    Return precautions given.   Risks, benefits, and alternatives of the medications and treatment plan prescribed today were discussed, and patient expressed understanding.   Education regarding symptom management and diagnosis given to patient on AVS.  Continue to follow with [name], FNP for routine health maintenance.   Jennifer [name] and I agreed with plan.   [name], FNP  I have spent 45 minutes with a patient including precharting, exam, reviewing medical records, and discussion plan of care.

## 2020-07-15 NOTE — Telephone Encounter (Signed)
Able to schedule today close

## 2020-07-17 NOTE — Assessment & Plan Note (Signed)
Abdominal pain and constipation resolved. Patient  Unfortunately had very complicated course. She has follow up scheduled with both Dr Manuella Ghazi and Dr Darnell Level. Will follow.

## 2020-07-17 NOTE — Assessment & Plan Note (Addendum)
Unusual presentation as altered sensation in one area of right thigh. Agreed that neurology consult, EMG reasonable. Referral placed. Increase gabapentin to 300mg  tid.  Symptoms not consistent with vascular findings at this time however will consider in the near future. Upon review of lumbar xray, no stenosis apparent however she does have facet changes. If symptoms persistent , may consider MRI lumbar , MRI pelvis as well.

## 2020-07-22 DIAGNOSIS — Z9484 Stem cells transplant status: Secondary | ICD-10-CM | POA: Diagnosis not present

## 2020-07-22 DIAGNOSIS — K668 Other specified disorders of peritoneum: Secondary | ICD-10-CM | POA: Diagnosis not present

## 2020-07-23 DIAGNOSIS — K839 Disease of biliary tract, unspecified: Secondary | ICD-10-CM | POA: Diagnosis not present

## 2020-07-31 ENCOUNTER — Ambulatory Visit: Payer: Medicare Other | Admitting: Family

## 2020-08-03 DIAGNOSIS — K769 Liver disease, unspecified: Secondary | ICD-10-CM | POA: Diagnosis not present

## 2020-08-03 DIAGNOSIS — Z885 Allergy status to narcotic agent status: Secondary | ICD-10-CM | POA: Diagnosis not present

## 2020-08-03 DIAGNOSIS — Z9884 Bariatric surgery status: Secondary | ICD-10-CM | POA: Diagnosis not present

## 2020-08-03 DIAGNOSIS — R011 Cardiac murmur, unspecified: Secondary | ICD-10-CM | POA: Diagnosis not present

## 2020-08-03 DIAGNOSIS — T188XXA Foreign body in other parts of alimentary tract, initial encounter: Secondary | ICD-10-CM | POA: Diagnosis not present

## 2020-08-03 DIAGNOSIS — Z4659 Encounter for fitting and adjustment of other gastrointestinal appliance and device: Secondary | ICD-10-CM | POA: Diagnosis not present

## 2020-08-03 DIAGNOSIS — E785 Hyperlipidemia, unspecified: Secondary | ICD-10-CM | POA: Diagnosis not present

## 2020-08-03 DIAGNOSIS — E039 Hypothyroidism, unspecified: Secondary | ICD-10-CM | POA: Diagnosis not present

## 2020-08-07 DIAGNOSIS — Z09 Encounter for follow-up examination after completed treatment for conditions other than malignant neoplasm: Secondary | ICD-10-CM | POA: Insufficient documentation

## 2020-08-11 ENCOUNTER — Other Ambulatory Visit: Payer: Self-pay | Admitting: Family

## 2020-08-13 DIAGNOSIS — Z79899 Other long term (current) drug therapy: Secondary | ICD-10-CM | POA: Diagnosis not present

## 2020-08-13 DIAGNOSIS — K839 Disease of biliary tract, unspecified: Secondary | ICD-10-CM | POA: Diagnosis not present

## 2020-08-13 DIAGNOSIS — Z4682 Encounter for fitting and adjustment of non-vascular catheter: Secondary | ICD-10-CM | POA: Diagnosis not present

## 2020-08-13 DIAGNOSIS — T85520D Displacement of bile duct prosthesis, subsequent encounter: Secondary | ICD-10-CM | POA: Diagnosis not present

## 2020-08-26 DIAGNOSIS — H04123 Dry eye syndrome of bilateral lacrimal glands: Secondary | ICD-10-CM | POA: Diagnosis not present

## 2020-08-27 ENCOUNTER — Ambulatory Visit (INDEPENDENT_AMBULATORY_CARE_PROVIDER_SITE_OTHER): Payer: Medicare Other | Admitting: Vascular Surgery

## 2020-08-31 ENCOUNTER — Other Ambulatory Visit: Payer: Self-pay | Admitting: Family

## 2020-08-31 ENCOUNTER — Telehealth: Payer: Self-pay | Admitting: Family

## 2020-08-31 NOTE — Telephone Encounter (Signed)
PT called to request a 90 day refill:  fexofenadine (ALLEGRA) 180 MG tablet famotidine (PEPCID) 20 MG tablet

## 2020-08-31 NOTE — Telephone Encounter (Signed)
These medications have not been prescribed by you before. Ok to send in?

## 2020-09-01 NOTE — Telephone Encounter (Signed)
Okay to send in pepcid to take prn and allegra to take prn

## 2020-09-02 ENCOUNTER — Other Ambulatory Visit: Payer: Self-pay

## 2020-09-02 MED ORDER — ALLERGY RELIEF 180 MG PO TABS: 180.0000 mg | ORAL_TABLET | ORAL | 0 refills | Status: DC | PRN

## 2020-09-02 NOTE — Telephone Encounter (Signed)
Allegra and Pepcid have been refilled

## 2020-09-03 DIAGNOSIS — I10 Essential (primary) hypertension: Secondary | ICD-10-CM | POA: Diagnosis not present

## 2020-09-03 DIAGNOSIS — R0602 Shortness of breath: Secondary | ICD-10-CM | POA: Diagnosis not present

## 2020-09-03 DIAGNOSIS — K219 Gastro-esophageal reflux disease without esophagitis: Secondary | ICD-10-CM | POA: Diagnosis not present

## 2020-09-03 DIAGNOSIS — R001 Bradycardia, unspecified: Secondary | ICD-10-CM | POA: Diagnosis not present

## 2020-09-03 DIAGNOSIS — R011 Cardiac murmur, unspecified: Secondary | ICD-10-CM | POA: Diagnosis not present

## 2020-09-03 DIAGNOSIS — R0609 Other forms of dyspnea: Secondary | ICD-10-CM | POA: Diagnosis not present

## 2020-09-03 DIAGNOSIS — G4733 Obstructive sleep apnea (adult) (pediatric): Secondary | ICD-10-CM | POA: Diagnosis not present

## 2020-09-03 DIAGNOSIS — I872 Venous insufficiency (chronic) (peripheral): Secondary | ICD-10-CM | POA: Diagnosis not present

## 2020-09-03 DIAGNOSIS — I890 Lymphedema, not elsewhere classified: Secondary | ICD-10-CM | POA: Diagnosis not present

## 2020-09-03 DIAGNOSIS — I208 Other forms of angina pectoris: Secondary | ICD-10-CM | POA: Diagnosis not present

## 2020-09-08 ENCOUNTER — Ambulatory Visit (INDEPENDENT_AMBULATORY_CARE_PROVIDER_SITE_OTHER): Payer: Medicare Other

## 2020-09-08 VITALS — BP 130/62 | Ht 61.0 in | Wt 175.0 lb

## 2020-09-08 DIAGNOSIS — Z0000 Encounter for general adult medical examination without abnormal findings: Secondary | ICD-10-CM

## 2020-09-08 NOTE — Progress Notes (Signed)
Subjective:   Donnette Silverio is a 70 y.o. female who presents for Medicare Annual (Subsequent) preventive examination.  Review of Systems    No ROS.  Medicare Wellness Virtual Visit.  Visual/audio telehealth visit, UTA vital signs.   See social history for additional risk factors.   Cardiac Risk Factors include: advanced age (>73mn, >>76women);hypertension     Objective:    Today's Vitals   09/08/20 0903  BP: 130/62  Weight: 175 lb (79.4 kg)  Height: '5\' 1"'$  (1.549 m)   Body mass index is 33.07 kg/m.  Advanced Directives 09/08/2020 06/10/2020 06/09/2020 03/08/2020 12/02/2019 10/01/2019 09/06/2019  Does Patient Have a Medical Advance Directive? Yes Yes No No Yes Yes Yes  Type of Advance Directive Healthcare Power of Attorney Living will - - - Living [place]  Does patient want to make changes to medical advance directive? No - Patient declined No - Patient declined - - - - No - Patient declined  Copy of [place]? No - copy requested - - - - - No - copy requested  Would patient like information on creating a medical advance directive? - No - Patient declined No - Patient declined No - Patient declined - - -    Current Medications (verified) Outpatient Encounter Medications as of 09/08/2020  Medication Sig   [DISCONTINUED] acetaminophen (TYLENOL) 500 MG tablet Take by mouth.   [DISCONTINUED] acyclovir (ZOVIRAX) 400 MG tablet TAKE 1 TABLET BY MOUTH EVERY DAY   [DISCONTINUED] Ascorbic Acid (VITAMIN C) 1000 MG tablet Take 1,000 mg by mouth daily.   [DISCONTINUED] aspirin 81 MG tablet Take 81 mg by mouth every other day.   [DISCONTINUED] azelastine (ASTELIN) 0.1 % nasal spray USE 1 SPRAY EACH NOSTRIL TWICE A DAY AS NEEDED FOR ALLERGIES   [DISCONTINUED] famotidine (PEPCID) 20 MG tablet Take 1 tablet (20 mg total) by mouth at bedtime.   [DISCONTINUED] fexofenadine (ALLEGRA) 180 MG tablet TAKE 1 TABLET BY MOUTH EVERY DAY    [DISCONTINUED] fluticasone (FLONASE) 50 MCG/ACT nasal spray PLACE 1 SPRAY INTO BOTH NOSTRILS DAILY.   [DISCONTINUED] gabapentin (NEURONTIN) 100 MG capsule Take two tablet in the morning and two tablets midday PO.   [DISCONTINUED] gabapentin (NEURONTIN) 300 MG capsule Take 1 capsule (300 mg total) by mouth at bedtime.   [DISCONTINUED] hydrocortisone (ANUSOL-HC) 25 MG suppository Place 1 suppository (25 mg total) rectally 2 (two) times daily as needed for hemorrhoids or anal itching.   [DISCONTINUED] levothyroxine (SYNTHROID) 50 MCG tablet TAKE 1 TABLET BY MOUTH EVERY DAY   [DISCONTINUED] losartan (COZAAR) 100 MG tablet Take 1 tablet (100 mg total) by mouth at bedtime.   [DISCONTINUED] Multiple Vitamins-Minerals (BARIATRIC MULTIVITAMINS/IRON PO) Take by mouth.   [DISCONTINUED] omeprazole (PRILOSEC) 20 MG capsule Take 1 capsule (20 mg total) by mouth 2 (two) times daily before a meal. (Patient taking differently: Take 20 mg by mouth every morning.)   [DISCONTINUED] Propylene Glycol (SYSTANE BALANCE) 0.6 % SOLN Apply to eye.   [DISCONTINUED] traZODone (DESYREL) 100 MG tablet TAKE 1 TABLET BY MOUTH EVERYDAY AT BEDTIME   [DISCONTINUED] triamterene-hydrochlorothiazide (DYAZIDE) 37.5-25 MG capsule Take 1 each (1 capsule total) by mouth daily.   No facility-administered encounter medications on file as of 09/08/2020.    Allergies (verified) Celecoxib, Hydrocodone, Pollen extract, Nasacort [triamcinolone], and Vicodin [hydrocodone-acetaminophen]   History: Past Medical History:  Diagnosis Date   Anxiety    Bariatric surgery status    Constipation    Dysrhythmia  Elevated liver enzymes    GERD (gastroesophageal reflux disease)    Hemorrhoids    Herpes genitalis    High cholesterol    Hyperlipidemia    Hypertension    Hypothyroidism    Neuropathy    Osteoarthritis    Sleep apnea    Past Surgical History:  Procedure Laterality Date   ABDOMINAL HYSTERECTOMY     total for fibroids no h/o  abnormal pap   bariatric sleeve  2015   BREAST EXCISIONAL BIOPSY Left 1998   carpal tunnel repair     COLONOSCOPY WITH PROPOFOL N/A 02/11/2016   Procedure: COLONOSCOPY WITH PROPOFOL;  Surgeon: Jonathon Bellows, MD;  Location: [facility] ENDOSCOPY;  Service: Endoscopy;  Laterality: N/A;   COLONOSCOPY WITH PROPOFOL N/A 10/01/2019   Procedure: COLONOSCOPY WITH PROPOFOL;  Surgeon: Jonathon Bellows, MD;  Location: [facility] ENDOSCOPY;  Service: Gastroenterology;  Laterality: N/A;   ESOPHAGOGASTRODUODENOSCOPY (EGD) WITH PROPOFOL N/A 12/02/2019   Procedure: ESOPHAGOGASTRODUODENOSCOPY (EGD) WITH PROPOFOL;  Surgeon: Jonathon Bellows, MD;  Location: [facility] ENDOSCOPY;  Service: Gastroenterology;  Laterality: N/A;   HEMORRHOID SURGERY     Family History  Problem Relation Age of Onset   Breast cancer Sister 74       materal 1/2 sister   Hypertension Sister    Hypertension Mother    Heart disease Father    Hypertension Brother    Social History   Socioeconomic History   Marital status: Married    Spouse name: Not on file   Number of children: Not on file   Years of education: Not on file   Highest education level: Not on file  Occupational History   Not on file  Tobacco Use   Smoking status: Former    Packs/day: 1.50    Years: 24.00    Pack years: 36.00    Types: Cigarettes    Quit date: 1993    Years since quitting: 29.6   Smokeless tobacco: Never   Tobacco comments:    quit 1995.   Vaping Use   Vaping Use: Never used  Substance and Sexual Activity   Alcohol use: Not Currently   Drug use: No   Sexual activity: Not Currently    Birth control/protection: Surgical    Comment: Hysterectomy  Other Topics Concern   Not on file  Social History Narrative   Lives in [place].    Married.    Retired 2015, Interior and spatial designer.    One son; granddaughter.    Social Determinants of Health   Financial Resource Strain: Low Risk    Difficulty of Paying Living Expenses: Not hard at all  Food Insecurity: No Food  Insecurity   Worried About Charity fundraiser in the Last Year: Never true   [place] in the Last Year: Never true  Transportation Needs: No Transportation Needs   Lack of Transportation (Medical): No   Lack of Transportation (Non-Medical): No  Physical Activity: Not on file  Stress: No Stress Concern Present   Feeling of Stress : Not at all  Social Connections: Unknown   Frequency of Communication with Friends and Family: More than three times a week   Frequency of Social Gatherings with Friends and Family: More than three times a week   Attends Religious Services: Not on file   Active Member of Clubs or Organizations: Not on file   Attends Archivist Meetings: Not on file   Marital Status: Not on file    Tobacco Counseling Counseling given:  Not Answered Tobacco comments: quit 1995.    Clinical Intake:  Pre-visit preparation completed: Yes        Diabetes: Yes (Followed by pcp)  How often do you need to have someone help you when you read instructions, pamphlets, or other written materials from your doctor or pharmacy?: 1 - Never    Interpreter Needed?: No      Activities of Daily Living In your present state of health, do you have any difficulty performing the following activities: 09/08/2020 06/10/2020  Hearing? N -  Vision? N -  Difficulty concentrating or making decisions? N -  Walking or climbing stairs? N -  Dressing or bathing? N -  Doing errands, shopping? - N  Conservation officer, nature and eating ? N -  Using the Toilet? N -  In the past six months, have you accidently leaked urine? N -  Do you have problems with loss of bowel control? N -  Managing your Medications? N -  Managing your Finances? N -  Housekeeping or managing your Housekeeping? N -  Some recent data might be hidden    Patient Care Team: [name], FNP as PCP - General (Family Medicine) Kennith Center, RD as Dietitian (Family Medicine)  Indicate any recent Medical  Services you may have received from other than [facility] providers in the past year (date may be approximate).     Assessment:   This is a routine wellness examination for [place].  I connected with Kelis today by telephone and verified that I am speaking with the correct person using two identifiers. Location patient: home Location provider: work Persons participating in the virtual visit: patient, Marine scientist.    I discussed the limitations, risks, security and privacy concerns of performing an evaluation and management service by telephone and the availability of in person appointments. The patient expressed understanding and verbally consented to this telephonic visit.    Interactive audio and video telecommunications were attempted between this provider and patient, however failed, due to patient having technical difficulties OR patient did not have access to video capability.  We continued and completed visit with audio only.  Some vital signs may be absent or patient reported.   Hearing/Vision screen Hearing Screening - Comments:: Patient is followed by [facility]. She does not wear hearing aids. Some difficulty hearing some conversational tones Vision Screening - Comments:: Followed by [facility] Wears corrective lenses  Vision examined within the last 12 months     Dietary issues and exercise activities discussed: Current Exercise Habits: Home exercise routine, Intensity: Mild Regular diet Good water intake   Goals Addressed               This Visit's Progress     Patient Stated     Increase physical activity (pt-stated)        I would like my weight to 155lb Stay active Healthy diet       Depression Screen PHQ 2/9 Scores 09/08/2020 05/18/2020 02/25/2020 10/21/2019 09/06/2019 09/05/2018 06/22/2018  PHQ - 2 Score 0 0 0 0 0 0 0  PHQ- 9 Score - - - - - - 0    Fall Risk Fall Risk  09/08/2020 05/18/2020 02/25/2020 10/21/2019 09/06/2019  Falls in the past year? 0 0 0 0 0   Number falls in past yr: 0 0 0 0 0  Injury with Fall? 0 0 0 0 -  Follow up Falls evaluation completed Falls evaluation completed Falls evaluation completed Falls evaluation completed  Falls evaluation completed    FALL RISK PREVENTION PERTAINING TO THE HOME: Adequate lighting in your home to reduce risk of falls? Yes   ASSISTIVE DEVICES UTILIZED TO PREVENT FALLS: Use of a cane, walker or w/c? No   TIMED UP AND GO: Was the test performed? No .   Cognitive Function: Patient is alert and oriented x3.  Enjoys word games for brain health.  No difficulty managing her own finances and medication.  MMSE/6CIT deferred. Normal by direct communication/observation.  MMSE - Mini Mental State Exam 08/31/2017  Orientation to time 5  Orientation to Place 5  Registration 3  Attention/ Calculation 5  Recall 3  Language- name 2 objects 2  Language- repeat 1  Language- follow 3 step command 3  Language- read & follow direction 1  Write a sentence 1  Copy design 1  Total score 30     6CIT Screen 09/06/2019 09/05/2018  What Year? 0 points 0 points  What month? 0 points 0 points  What time? 0 points 0 points  Count back from 20 0 points 0 points  Months in reverse 0 points 0 points  Repeat phrase 0 points 0 points  Total Score 0 0    Immunizations Immunization History  Administered Date(s) Administered   Hep A / Hep B 05/24/2013, 11/21/2013   Influenza Split 10/21/2013   Influenza, High Dose Seasonal PF 10/24/2016, 09/06/2018   Influenza,inj,Quad PF,6+ Mos 11/08/2013, 10/28/2014   Influenza-Unspecified 12/05/2011, 10/28/2015, 10/24/2016, 09/06/2017, 10/18/2019   PFIZER(Purple Top)SARS-COV-2 Vaccination 03/15/2019, 04/09/2019, 04/09/2019, 11/01/2019, 08/07/2020   Pneumococcal Conjugate-13 12/05/2014, 04/06/2016   Pneumococcal Polysaccharide-23 06/05/2017   Tdap 02/05/2014, 08/07/2018   Zoster Recombinat (Shingrix) 12/25/2018   Zoster, Live 01/09/2012, 12/05/2014   Shingrix vaccine-  plans to complete series and update immunization record. Deferred in HM.   A1C- due. Agrees to come fasting next morning OV.   Health Maintenance Health Maintenance  Topic Date Due   HEMOGLOBIN A1C  09/07/2020   INFLUENZA VACCINE  10/25/2020 (Originally 08/17/2020)   Zoster Vaccines- Shingrix (2 of 2) 12/09/2020 (Originally 02/19/2019)   FOOT EXAM  09/08/2021 (Originally 11/28/2018)   COVID-19 Vaccine (5 - Booster for Pfizer series) 12/08/2020   URINE MICROALBUMIN  03/10/2021   OPHTHALMOLOGY EXAM  08/26/2021   MAMMOGRAM  03/02/2022   COLONOSCOPY (Pts 45-54yr Insurance coverage will need to be confirmed)  09/30/2024   TETANUS/TDAP  08/06/2028   DEXA SCAN  Completed   Hepatitis C Screening  Completed   PNA vac Low Risk Adult  Completed   HPV VACCINES  Aged Out   Foot exam- due. Denies change in feet. Scheduled to visit with Neurology for ongoing neuropathy. Foot exam deferred in HM.   DG Chest port view: completed 06/09/20  Vision Screening: Recommended annual ophthalmology exams for early detection of glaucoma and other disorders of the eye.  Dental Screening: Recommended annual dental exams for proper oral hygiene. Visits every 4-6 months.   Community Resource Referral / Chronic Care Management: CRR required this visit?  No   CCM required this visit?  No      Plan:     I have personally reviewed and noted the following in the patient's chart:   Medical and social history Use of alcohol, tobacco or illicit drugs  Current medications and supplements including opioid prescriptions. Not taking opioid.  Functional ability and status Nutritional status Physical activity Advanced directives List of other physicians Hospitalizations, surgeries, and ER visits in previous 12 months Vitals Screenings to include  cognitive, depression, and falls Referrals and appointments  In addition, I have reviewed and discussed with patient certain preventive protocols, quality metrics, and  best practice recommendations. A written personalized care plan for preventive services as well as general preventive health recommendations were provided to patient.     Varney Biles, LPN   [phone]

## 2020-09-08 NOTE — Patient Instructions (Addendum)
  Jennifer [name] , Thank you for taking time to come for your Medicare Wellness Visit. I appreciate your ongoing commitment to your health goals. Please review the following plan we discussed and let me know if I can assist you in the future.   These are the goals we discussed:  Goals       Patient Stated     Increase physical activity (pt-stated)      I would like my weight to 155lb Stay active Healthy diet        This is a list of the screening recommended for you and due dates:  Health Maintenance  Topic Date Due   Hemoglobin A1C  09/07/2020   Flu Shot  10/25/2020*   Zoster (Shingles) Vaccine (2 of 2) 12/09/2020*   Complete foot exam   09/08/2021*   COVID-19 Vaccine (5 - Booster for Pfizer series) 12/08/2020   Urine Protein Check  03/10/2021   Eye exam for diabetics  08/26/2021   Mammogram  03/02/2022   Colon Cancer Screening  09/30/2024   Tetanus Vaccine  08/06/2028   DEXA scan (bone density measurement)  Completed   Hepatitis C Screening: USPSTF Recommendation to screen - Ages 31-79 yo.  Completed   Pneumonia vaccines  Completed   HPV Vaccine  Aged Out  *Topic was postponed. The date shown is not the original due date.    Fasting next office visit.

## 2020-09-22 ENCOUNTER — Telehealth: Payer: Self-pay | Admitting: Gastroenterology

## 2020-09-22 DIAGNOSIS — K839 Disease of biliary tract, unspecified: Secondary | ICD-10-CM | POA: Diagnosis not present

## 2020-09-22 DIAGNOSIS — K828 Other specified diseases of gallbladder: Secondary | ICD-10-CM | POA: Diagnosis not present

## 2020-09-22 NOTE — Telephone Encounter (Signed)
PT. REQUESTING A CALL BACK SHE SA\YS SHE IS HAVING TROUBLE SWALLOWING AND HAS BOOD IN HR STOOLS,.

## 2020-09-23 ENCOUNTER — Other Ambulatory Visit: Payer: Self-pay | Admitting: Family

## 2020-09-23 NOTE — Telephone Encounter (Signed)
Please schedule an appointment to see Dr. [name]. Thank you.

## 2020-09-24 ENCOUNTER — Other Ambulatory Visit: Payer: Self-pay | Admitting: Otolaryngology

## 2020-09-24 DIAGNOSIS — H9313 Tinnitus, bilateral: Secondary | ICD-10-CM | POA: Diagnosis not present

## 2020-09-24 DIAGNOSIS — R1314 Dysphagia, pharyngoesophageal phase: Secondary | ICD-10-CM | POA: Diagnosis not present

## 2020-10-02 ENCOUNTER — Other Ambulatory Visit: Payer: Self-pay

## 2020-10-02 ENCOUNTER — Ambulatory Visit (INDEPENDENT_AMBULATORY_CARE_PROVIDER_SITE_OTHER): Payer: Medicare Other | Admitting: Family

## 2020-10-02 ENCOUNTER — Encounter: Payer: Self-pay | Admitting: Family

## 2020-10-02 VITALS — BP 159/73 | HR 62 | Temp 98.6°F | Ht 61.0 in | Wt 180.0 lb

## 2020-10-02 DIAGNOSIS — F419 Anxiety disorder, unspecified: Secondary | ICD-10-CM

## 2020-10-02 DIAGNOSIS — Z23 Encounter for immunization: Secondary | ICD-10-CM | POA: Diagnosis not present

## 2020-10-02 DIAGNOSIS — M545 Low back pain, unspecified: Secondary | ICD-10-CM | POA: Diagnosis not present

## 2020-10-02 DIAGNOSIS — I10 Essential (primary) hypertension: Secondary | ICD-10-CM | POA: Diagnosis not present

## 2020-10-02 DIAGNOSIS — F32A Depression, unspecified: Secondary | ICD-10-CM

## 2020-10-02 DIAGNOSIS — E119 Type 2 diabetes mellitus without complications: Secondary | ICD-10-CM | POA: Diagnosis not present

## 2020-10-02 LAB — POCT GLYCOSYLATED HEMOGLOBIN (HGB A1C): Hemoglobin A1C: 4.9 % (ref 4.0–5.6)

## 2020-10-02 MED ORDER — SERTRALINE HCL 50 MG PO TABS: 50.0000 mg | ORAL_TABLET | Freq: Every day | ORAL | 3 refills | Status: DC

## 2020-10-02 NOTE — Assessment & Plan Note (Signed)
Uncontrolled.  Advised how to wean off of trazodone 100 mg is not effective.  She will start Zoloft 50 mg.  Close follow-up

## 2020-10-02 NOTE — Assessment & Plan Note (Signed)
No back pain today.  known degenerative changes seen lumbar x-ray 05/2020.   She continues to have altered sensation primarily right thigh which is we discussed today may be related to surgery based on symptom onset.  As symptoms persist however, agreed to pursue MRI lumbar spine.

## 2020-10-02 NOTE — Patient Instructions (Addendum)
You may take HALF tablet of triamterence-HCTZ for blood pressure, leg swelling.  Continue losartan '100mg'$   It is imperative that you are seen AT least twice per year for labs and monitoring. Monitor blood pressure at home and me 5-6 reading on separate days. Goal is less than 120/80, based on newest guidelines, however we certainly want to be less than 130/80;  if persistently higher, please make sooner follow up appointment so we can recheck you blood pressure and manage/ adjust medications.   Start zoloft '50mg'$  for sleep, anxiety. You may stop trazodone by taking '50mg'$  tablet daily for 3 days and then medication entirely.   MRI lumbar ordered  Let us know if you dont hear back within a week in regards to an appointment being scheduled.

## 2020-10-02 NOTE — Progress Notes (Signed)
Subjective:    Patient ID: Jennifer [name], female    DOB: Jun 06, 1950, 70 y.o.   MRN: [id number]  CC: Jennifer [name] is a 70 y.o. female who presents today for follow up.   HPI: She reports anxiety, trouble sleeping at night.  The trazodone 100 mg is not effective.  She does not recall how she felt on Zoloft 50 mg.  No notes in chart regarding this.    Due for last shingrex . Can schedule shingrex 2/2 when patient is agreebable.   Continues continues to follow with Dr. Manuella Ghazi for cystic duct leak , 09/22/20.  Status post endoscopic stent and the percutaneous biliary drain both removed.  Plan to repeat EGD to evaluate for bleeding. She will schedule this with [name].  Continues to follow with Dr. Clayborn Bigness 09/03/20 without changes made to regimen  She saw Dr Pryor Ochoa last week and started on triamterene HCTZ 37.5- '25mg'$  however now advised to stop as not effective for tinnitus. Per patient , he ordered MRI brain.  She would like to continue triamterene hydrochlorothiazide as she has bilateral ankle swelling.  HTN- She is also on losartan '100mg'$ .  BP at home 147/72 yesterday. BP at home 115-125/ 60.  She has never calibrated blood pressure cuff with [name] Endorses salt indiscretion. No cp, sob, dizziness.   Peripheral neuropathy- compliant with gabapentin '300mg'$  TID She has no low back pain at this time.  She does continue to have altered sensation in the right leg.    Altered sensation of right leg and she thinks related to femoral catheter as Dr Manuella Ghazi. Sensation started after surgery.  No metal in body.  She denies having a pacemaker   HISTORY:  Past Medical History:  Diagnosis Date   Anxiety    Bariatric surgery status    Constipation    Dysrhythmia    Elevated liver enzymes    GERD (gastroesophageal reflux disease)    Hemorrhoids    Herpes genitalis    High cholesterol    Hyperlipidemia    Hypertension    Hypothyroidism    Neuropathy    Osteoarthritis    Sleep apnea    Past  Surgical History:  Procedure Laterality Date   ABDOMINAL HYSTERECTOMY     total for fibroids no h/o abnormal pap   bariatric sleeve  2015   BREAST EXCISIONAL BIOPSY Left 1998   carpal tunnel repair     COLONOSCOPY WITH PROPOFOL N/A 02/11/2016   Procedure: COLONOSCOPY WITH PROPOFOL;  Surgeon: Jonathon Bellows, MD;  Location: [facility] ENDOSCOPY;  Service: Endoscopy;  Laterality: N/A;   COLONOSCOPY WITH PROPOFOL N/A 10/01/2019   Procedure: COLONOSCOPY WITH PROPOFOL;  Surgeon: Jonathon Bellows, MD;  Location: [facility] ENDOSCOPY;  Service: Gastroenterology;  Laterality: N/A;   ESOPHAGOGASTRODUODENOSCOPY (EGD) WITH PROPOFOL N/A 12/02/2019   Procedure: ESOPHAGOGASTRODUODENOSCOPY (EGD) WITH PROPOFOL;  Surgeon: Jonathon Bellows, MD;  Location: [facility] ENDOSCOPY;  Service: Gastroenterology;  Laterality: N/A;   HEMORRHOID SURGERY     Family History  Problem Relation Age of Onset   Breast cancer Sister 24       materal 1/2 sister   Hypertension Sister    Hypertension Mother    Heart disease Father    Hypertension Brother     Allergies: Celecoxib, Hydrocodone, Pollen extract, Nasacort [triamcinolone], and Vicodin [hydrocodone-acetaminophen] Current Outpatient Medications on File Prior to Visit  Medication Sig Dispense Refill   acetaminophen (TYLENOL) 325 MG tablet Take 2 tablets (650 mg total) by mouth every 6 (six) hours as needed for  mild pain (or Fever >/= 101).     acyclovir (ZOVIRAX) 400 MG tablet Take 400 mg by mouth daily.     ALLERGY RELIEF 180 MG tablet Take 1 tablet (180 mg total) by mouth as needed for allergies or rhinitis. 90 tablet 0   famotidine (PEPCID) 20 MG tablet TAKE 1 TABLET BY MOUTH EVERY NIGHT AT BEDTIME 90 tablet 1   gabapentin (NEURONTIN) 100 MG capsule TAKE 2 CAPSULES BY MOUTH EVERY MORNING AND 2 CAPSULES MIDDAY 120 capsule 1   gabapentin (NEURONTIN) 300 MG capsule Take 1 capsule (300 mg total) by mouth 3 (three) times daily. 270 capsule 3   levothyroxine (SYNTHROID) 50 MCG tablet TAKE 1 TABLET BY  MOUTH EVERY DAY 90 tablet 1   losartan (COZAAR) 100 MG tablet Take 100 mg by mouth at bedtime.     triamterene-hydrochlorothiazide (MAXZIDE-25) 37.5-25 MG tablet Take 1 tablet by mouth daily.     No current facility-administered medications on file prior to visit.    Social History   Tobacco Use   Smoking status: Former    Packs/day: 1.50    Years: 24.00    Pack years: 36.00    Types: Cigarettes    Quit date: 1993    Years since quitting: 29.7   Smokeless tobacco: Never   Tobacco comments:    quit 1995.   Vaping Use   Vaping Use: Never used  Substance Use Topics   Alcohol use: Not Currently   Drug use: No    Review of Systems  Constitutional:  Negative for chills and fever.  Respiratory:  Negative for cough and shortness of breath.   Cardiovascular:  Positive for leg swelling. Negative for chest pain and palpitations.  Gastrointestinal:  Negative for nausea and vomiting.  Musculoskeletal:  Negative for back pain.  Neurological:  Positive for numbness.  Psychiatric/Behavioral:  Positive for sleep disturbance. The patient is nervous/anxious.      Objective:    BP (!) 159/73 (BP Location: Left Arm, Patient Position: Sitting, Cuff Size: Large)   Pulse 62   Temp 98.6 F (37 C) (Oral)   Ht '5\' 1"'$  (1.549 m)   Wt 180 lb (81.6 kg)   SpO2 99%   BMI 34.01 kg/m  BP Readings from Last 3 Encounters:  10/02/20 (!) 159/73  09/08/20 130/62  07/15/20 114/68   Wt Readings from Last 3 Encounters:  10/02/20 180 lb (81.6 kg)  09/08/20 175 lb (79.4 kg)  07/15/20 168 lb 6.4 oz (76.4 kg)    Physical Exam Vitals reviewed.  Constitutional:      Appearance: She is well-developed.  Eyes:     Conjunctiva/sclera: Conjunctivae normal.  Cardiovascular:     Rate and Rhythm: Normal rate and regular rhythm.     Pulses: Normal pulses.     Heart sounds: Normal heart sounds.     Comments: Trace nonpitting bilateral pedal edema, dorsal aspect bilateral feet. No asymmetry in calf size when  compared bilaterally LE hair growth symmetric and present. No discoloration or varicosities noted. LE warm and palpable pedal pulses.  Pulmonary:     Effort: Pulmonary effort is normal.     Breath sounds: Normal breath sounds. No wheezing, rhonchi or rales.  Musculoskeletal:     Lumbar back: No swelling, edema, spasms, tenderness or bony tenderness. Normal range of motion.     Comments: Full range of motion with flexion, tension, lateral side bends. No bony tenderness. No pain, numbness, tingling elicited with single leg raise bilaterally.  Skin:    General: Skin is warm and dry.  Neurological:     Mental Status: She is alert.     Sensory: No sensory deficit.     Deep Tendon Reflexes:     Reflex Scores:      Patellar reflexes are 2+ on the right side and 2+ on the left side.    Comments: Sensation and strength intact bilateral lower extremities.  Psychiatric:        Speech: Speech normal.        Behavior: Behavior normal.        Thought Content: Thought content normal.       Assessment & Plan:   Problem List Items Addressed This Visit       Cardiovascular and Mediastinum   Essential hypertension - Primary    Elevated in office however more controlled at home.  Question whether blood pressure cuff is accurate.  Advised to continue losartan 100 mg.  Was prescribed triamterene hydrochlorothiazide 37.5-25 mg by Dr. Pryor Ochoa, ENT.  Advise she may take 1/2 tablet which may benefit blood pressure and leg swelling.  Advised patient to employ bilateral compression stockings, and follow a low-sodium diet to also help with swelling.  BMP scheduled in 1 week.  Monitor BP at home.  Advised patient to bring blood pressure cuff to her next appointment. close follow-up in 4-week      Relevant Medications   losartan (COZAAR) 100 MG tablet   Other Relevant Orders   Basic metabolic panel     Endocrine   Diabetes mellitus without complication (HCC)   Relevant Medications   losartan (COZAAR)  100 MG tablet   Other Relevant Orders   POCT HgB A1C (Completed)     Other   Anxiety and depression    Uncontrolled.  Advised how to wean off of trazodone 100 mg is not effective.  She will start Zoloft 50 mg.  Close follow-up      Relevant Medications   sertraline (ZOLOFT) 50 MG tablet   Lumbar back pain    No back pain today.  known degenerative changes seen lumbar x-ray 05/2020.   She continues to have altered sensation primarily right thigh which is we discussed today may be related to surgery based on symptom onset.  As symptoms persist however, agreed to pursue MRI lumbar spine.      Relevant Orders   MR Lumbar Spine Wo Contrast   Other Visit Diagnoses     Need for immunization against influenza       Relevant Orders   Flu Vaccine QUAD High Dose(Fluad) (Completed)        I am having Alaysiah Mones start on sertraline. I am also having her maintain her acetaminophen, acyclovir, triamterene-hydrochlorothiazide, gabapentin, gabapentin, famotidine, Allergy Relief, levothyroxine, and losartan.   Meds ordered this encounter  Medications   sertraline (ZOLOFT) 50 MG tablet    Sig: Take 1 tablet (50 mg total) by mouth at bedtime.    Dispense:  90 tablet    Refill:  3    Order Specific Question:   Supervising Provider    Answer:   Crecencio Mc [[id number]]     Return precautions given.   Risks, benefits, and alternatives of the medications and treatment plan prescribed today were discussed, and patient expressed understanding.   Education regarding symptom management and diagnosis given to patient on AVS.  Continue to follow with [name], FNP for routine health maintenance.   Jennifer [name] and I agreed  with plan.   [name], FNP

## 2020-10-02 NOTE — Assessment & Plan Note (Signed)
Elevated in office however more controlled at home.  Question whether blood pressure cuff is accurate.  Advised to continue losartan 100 mg.  Was prescribed triamterene hydrochlorothiazide 37.5-25 mg by Dr. Pryor Ochoa, ENT.  Advise she may take 1/2 tablet which may benefit blood pressure and leg swelling.  Advised patient to employ bilateral compression stockings, and follow a low-sodium diet to also help with swelling.  BMP scheduled in 1 week.  Monitor BP at home.  Advised patient to bring blood pressure cuff to her next appointment. close follow-up in 4-week

## 2020-10-05 ENCOUNTER — Ambulatory Visit
Admission: RE | Admit: 2020-10-05 | Discharge: 2020-10-05 | Disposition: A | Discharge status: Home / Self Care | Payer: Medicare Other | Source: Ambulatory Visit | Attending: Otolaryngology | Admitting: Otolaryngology

## 2020-10-05 ENCOUNTER — Ambulatory Visit
Admission: RE | Admit: 2020-10-05 | Discharge: 2020-10-05 | Disposition: A | Discharge status: Home / Self Care | Payer: Medicare Other | Source: Ambulatory Visit | Attending: Family | Admitting: Family

## 2020-10-05 DIAGNOSIS — M545 Low back pain, unspecified: Secondary | ICD-10-CM | POA: Diagnosis not present

## 2020-10-05 DIAGNOSIS — M5127 Other intervertebral disc displacement, lumbosacral region: Secondary | ICD-10-CM | POA: Diagnosis not present

## 2020-10-05 DIAGNOSIS — M47816 Spondylosis without myelopathy or radiculopathy, lumbar region: Secondary | ICD-10-CM | POA: Diagnosis not present

## 2020-10-05 DIAGNOSIS — H9313 Tinnitus, bilateral: Secondary | ICD-10-CM | POA: Insufficient documentation

## 2020-10-05 DIAGNOSIS — H9319 Tinnitus, unspecified ear: Secondary | ICD-10-CM | POA: Diagnosis not present

## 2020-10-05 DIAGNOSIS — M48061 Spinal stenosis, lumbar region without neurogenic claudication: Secondary | ICD-10-CM | POA: Diagnosis not present

## 2020-10-05 MED ORDER — GADOBUTROL 1 MMOL/ML IV SOLN
7.5000 mL | Freq: Once | INTRAVENOUS | Status: AC | PRN
  Administered 2020-10-05: 7.5 mL via INTRAVENOUS

## 2020-10-06 ENCOUNTER — Encounter: Payer: Self-pay | Admitting: *Deleted

## 2020-10-07 ENCOUNTER — Telehealth: Payer: Self-pay | Admitting: Gastroenterology

## 2020-10-07 NOTE — Telephone Encounter (Signed)
Pt. Calling to schedule endoscopy

## 2020-10-08 ENCOUNTER — Other Ambulatory Visit: Payer: Self-pay

## 2020-10-08 DIAGNOSIS — K219 Gastro-esophageal reflux disease without esophagitis: Secondary | ICD-10-CM

## 2020-10-08 DIAGNOSIS — R1310 Dysphagia, unspecified: Secondary | ICD-10-CM

## 2020-10-08 MED ORDER — PEG 3350-KCL-NA BICARB-NACL 420 G PO SOLR: 4000.0000 mL | Freq: Once | ORAL | 0 refills | Status: AC

## 2020-10-08 NOTE — Progress Notes (Signed)
Had to cancel procedures patient has to come in for a visit before we can schedule a egd called patient she has a appointment for 11/06

## 2020-10-08 NOTE — Progress Notes (Signed)
Gastroenterology Pre-Procedure Review  Request Date: 10/22/2020 Requesting Physician: Dr. [name]  PATIENT REVIEW QUESTIONS: The patient responded to the following health history questions as indicated:    1. Are you having any GI issues? yes (throat sometimes) 2. Do you have a personal history of Polyps? yes (removed one last colonoscopy) 3. Do you have a family history of Colon Cancer or Polyps? no 4. Diabetes Mellitus? no 5. Joint replacements in the past 12 months?no 6. Major health problems in the past 3 months?yes (possible cist on putuitary gland) 7. Any artificial heart valves, MVP, or defibrillator?no    MEDICATIONS & ALLERGIES:    Patient reports the following regarding taking any anticoagulation/antiplatelet therapy:   Plavix, Coumadin, Eliquis, Xarelto, Lovenox, Pradaxa, Brilinta, or Effient? no Aspirin? yes (81 mg)  Patient confirms/reports the following medications:  Current Outpatient Medications  Medication Sig Dispense Refill   acetaminophen (TYLENOL) 325 MG tablet Take 2 tablets (650 mg total) by mouth every 6 (six) hours as needed for mild pain (or Fever >/= 101).     acyclovir (ZOVIRAX) 400 MG tablet Take 400 mg by mouth daily.     ALLERGY RELIEF 180 MG tablet Take 1 tablet (180 mg total) by mouth as needed for allergies or rhinitis. 90 tablet 0   famotidine (PEPCID) 20 MG tablet TAKE 1 TABLET BY MOUTH EVERY NIGHT AT BEDTIME 90 tablet 1   gabapentin (NEURONTIN) 100 MG capsule TAKE 2 CAPSULES BY MOUTH EVERY MORNING AND 2 CAPSULES MIDDAY 120 capsule 1   gabapentin (NEURONTIN) 300 MG capsule Take 1 capsule (300 mg total) by mouth 3 (three) times daily. 270 capsule 3   levothyroxine (SYNTHROID) 50 MCG tablet TAKE 1 TABLET BY MOUTH EVERY DAY 90 tablet 1   losartan (COZAAR) 100 MG tablet Take 100 mg by mouth at bedtime.     sertraline (ZOLOFT) 50 MG tablet Take 1 tablet (50 mg total) by mouth at bedtime. 90 tablet 3   triamterene-hydrochlorothiazide (MAXZIDE-25) 37.5-25 MG  tablet Take 1 tablet by mouth daily.     No current facility-administered medications for this visit.    Patient confirms/reports the following allergies:  Allergies  Allergen Reactions   Celecoxib Hives   Hydrocodone Nausea Only   Pollen Extract    Nasacort [Triamcinolone] Other (See Comments)    Nasal - Nose Bleeds   Vicodin [Hydrocodone-Acetaminophen] Nausea Only    No orders of the defined types were placed in this encounter.   AUTHORIZATION INFORMATION Primary Insurance: 1D#: Group #:  Secondary Insurance: 1D#: Group #:  SCHEDULE INFORMATION: Date: 10/22/2020 Time: Location: [facility]

## 2020-10-09 ENCOUNTER — Encounter: Payer: Self-pay | Admitting: Emergency Medicine

## 2020-10-09 ENCOUNTER — Emergency Department: Payer: No Typology Code available for payment source

## 2020-10-09 ENCOUNTER — Emergency Department
Admission: EM | Admit: 2020-10-09 | Discharge: 2020-10-09 | Disposition: A | Discharge status: Home / Self Care | Payer: No Typology Code available for payment source | Attending: Emergency Medicine | Admitting: Emergency Medicine

## 2020-10-09 ENCOUNTER — Ambulatory Visit: Payer: Medicare Other

## 2020-10-09 ENCOUNTER — Other Ambulatory Visit: Payer: Self-pay

## 2020-10-09 ENCOUNTER — Other Ambulatory Visit: Payer: Medicare Other

## 2020-10-09 DIAGNOSIS — I10 Essential (primary) hypertension: Secondary | ICD-10-CM | POA: Diagnosis not present

## 2020-10-09 DIAGNOSIS — M25552 Pain in left hip: Secondary | ICD-10-CM | POA: Diagnosis not present

## 2020-10-09 DIAGNOSIS — Z950 Presence of cardiac pacemaker: Secondary | ICD-10-CM | POA: Insufficient documentation

## 2020-10-09 DIAGNOSIS — M791 Myalgia, unspecified site: Secondary | ICD-10-CM | POA: Diagnosis not present

## 2020-10-09 DIAGNOSIS — E039 Hypothyroidism, unspecified: Secondary | ICD-10-CM | POA: Diagnosis not present

## 2020-10-09 DIAGNOSIS — Z87891 Personal history of nicotine dependence: Secondary | ICD-10-CM | POA: Diagnosis not present

## 2020-10-09 DIAGNOSIS — R079 Chest pain, unspecified: Secondary | ICD-10-CM | POA: Insufficient documentation

## 2020-10-09 DIAGNOSIS — M50322 Other cervical disc degeneration at C5-C6 level: Secondary | ICD-10-CM | POA: Diagnosis not present

## 2020-10-09 DIAGNOSIS — M545 Low back pain, unspecified: Secondary | ICD-10-CM | POA: Diagnosis not present

## 2020-10-09 DIAGNOSIS — M25519 Pain in unspecified shoulder: Secondary | ICD-10-CM | POA: Diagnosis not present

## 2020-10-09 DIAGNOSIS — Z743 Need for continuous supervision: Secondary | ICD-10-CM | POA: Diagnosis not present

## 2020-10-09 DIAGNOSIS — E119 Type 2 diabetes mellitus without complications: Secondary | ICD-10-CM | POA: Diagnosis not present

## 2020-10-09 DIAGNOSIS — Z79899 Other long term (current) drug therapy: Secondary | ICD-10-CM | POA: Insufficient documentation

## 2020-10-09 DIAGNOSIS — Y92410 Unspecified street and highway as the place of occurrence of the external cause: Secondary | ICD-10-CM | POA: Diagnosis not present

## 2020-10-09 DIAGNOSIS — M25551 Pain in right hip: Secondary | ICD-10-CM | POA: Diagnosis not present

## 2020-10-09 DIAGNOSIS — R6889 Other general symptoms and signs: Secondary | ICD-10-CM | POA: Diagnosis not present

## 2020-10-09 DIAGNOSIS — M7918 Myalgia, other site: Secondary | ICD-10-CM

## 2020-10-09 DIAGNOSIS — M542 Cervicalgia: Secondary | ICD-10-CM | POA: Diagnosis not present

## 2020-10-09 DIAGNOSIS — M50323 Other cervical disc degeneration at C6-C7 level: Secondary | ICD-10-CM | POA: Diagnosis not present

## 2020-10-09 DIAGNOSIS — Z041 Encounter for examination and observation following transport accident: Secondary | ICD-10-CM | POA: Diagnosis not present

## 2020-10-09 MED ORDER — CYCLOBENZAPRINE HCL 10 MG PO TABS
10.0000 mg | ORAL_TABLET | Freq: Once | ORAL | Status: AC
  Administered 2020-10-09: 10 mg via ORAL
  Filled 2020-10-09: qty 1

## 2020-10-09 MED ORDER — CYCLOBENZAPRINE HCL 5 MG PO TABS: 5.0000 mg | ORAL_TABLET | Freq: Three times a day (TID) | ORAL | 0 refills | Status: DC | PRN

## 2020-10-09 MED ORDER — TRAMADOL HCL 50 MG PO TABS
50.0000 mg | ORAL_TABLET | Freq: Once | ORAL | Status: AC
  Administered 2020-10-09: 50 mg via ORAL
  Filled 2020-10-09: qty 1

## 2020-10-09 MED ORDER — TRAMADOL HCL 50 MG PO TABS: 50.0000 mg | ORAL_TABLET | Freq: Two times a day (BID) | ORAL | 0 refills | Status: DC | PRN

## 2020-10-09 NOTE — ED Notes (Signed)
Patient transported to X-ray 
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
## 2020-10-09 NOTE — Discharge Instructions (Signed)
No acute findings with x-rays of the cervical spine lumbar spine and right hip.  Read and follow discharge care instruction.  Take medication as directed.

## 2020-10-14 ENCOUNTER — Encounter: Payer: Self-pay | Admitting: Diagnostic Neuroimaging

## 2020-10-14 ENCOUNTER — Ambulatory Visit (INDEPENDENT_AMBULATORY_CARE_PROVIDER_SITE_OTHER): Payer: Medicare Other | Admitting: Diagnostic Neuroimaging

## 2020-10-14 VITALS — BP 122/66 | HR 69 | Ht 61.0 in | Wt 176.0 lb

## 2020-10-14 DIAGNOSIS — R6889 Other general symptoms and signs: Secondary | ICD-10-CM | POA: Diagnosis not present

## 2020-10-14 DIAGNOSIS — R748 Abnormal levels of other serum enzymes: Secondary | ICD-10-CM | POA: Diagnosis not present

## 2020-10-14 DIAGNOSIS — E237 Disorder of pituitary gland, unspecified: Secondary | ICD-10-CM

## 2020-10-14 DIAGNOSIS — G629 Polyneuropathy, unspecified: Secondary | ICD-10-CM | POA: Diagnosis not present

## 2020-10-14 DIAGNOSIS — R799 Abnormal finding of blood chemistry, unspecified: Secondary | ICD-10-CM | POA: Diagnosis not present

## 2020-10-14 NOTE — Progress Notes (Signed)
[facility]  PATIENT: Jennifer [name] DOB: [birth year]  REFERRING CLINICIAN: [name], FNP and C Vaught,MD HISTORY FROM: patient  REASON FOR VISIT: new consult    HISTORICAL  CHIEF COMPLAINT:  Chief Complaint  Patient presents with   New Patient (Initial Visit)    RM 6 with husband Pt reports she is here to discuss neuropathy and recent MRI imaging. Pt reports neuropathy symptoms are present in bilateral feet and worse on the right. Reports associated right leg numbness as well. Pt is currently taking Gabapentin for neuropathy. Does not notice much benefit while taking.    HISTORY OF PRESENT ILLNESS:   70 year old female here for evaluation of neuropathy.  Patient had gastric sleeve bariatric surgery procedure in 2015.  Around 2018 she started to have numbness in the bottom of her feet and toes.  She feels like she is walking on rocks or fluid.  She is feeling slightly off balance.  This is gradually progressed over time.  She has some discomfort on the right lateral lower leg below the knee.  She tried gabapentin without relief.  She has some chronic low back pain without radiating symptoms.  She had MRI of the lumbar spine recently which showed some mild degenerative changes.  Separately patient had some ringing in the ears and went to ENT.  She had MRI of the brain which was unremarkable but showed an incidental pituitary lesion measuring 3 mm.   REVIEW OF SYSTEMS: Full 14 system review of systems performed and negative with exception of: As per HPI.  ALLERGIES: Allergies  Allergen Reactions   Celecoxib Hives   Hydrocodone Nausea Only   Pollen Extract    Nasacort [Triamcinolone] Other (See Comments)    Nasal - Nose Bleeds   Vicodin [Hydrocodone-Acetaminophen] Nausea Only    HOME MEDICATIONS: Outpatient Medications Prior to Visit  Medication Sig Dispense Refill   acetaminophen (TYLENOL) 325 MG tablet Take 2 tablets (650 mg total) by mouth  every 6 (six) hours as needed for mild pain (or Fever >/= 101).     acyclovir (ZOVIRAX) 400 MG tablet Take 400 mg by mouth daily.     ALLERGY RELIEF 180 MG tablet Take 1 tablet (180 mg total) by mouth as needed for allergies or rhinitis. 90 tablet 0   famotidine (PEPCID) 20 MG tablet TAKE 1 TABLET BY MOUTH EVERY NIGHT AT BEDTIME 90 tablet 1   gabapentin (NEURONTIN) 100 MG capsule TAKE 2 CAPSULES BY MOUTH EVERY MORNING AND 2 CAPSULES MIDDAY 120 capsule 1   gabapentin (NEURONTIN) 300 MG capsule Take 1 capsule (300 mg total) by mouth 3 (three) times daily. (Patient taking differently: Take 300 mg by mouth at bedtime.) 270 capsule 3   levothyroxine (SYNTHROID) 50 MCG tablet TAKE 1 TABLET BY MOUTH EVERY DAY 90 tablet 1   losartan (COZAAR) 100 MG tablet Take 100 mg by mouth at bedtime.     sertraline (ZOLOFT) 50 MG tablet Take 1 tablet (50 mg total) by mouth at bedtime. 90 tablet 3   triamterene-hydrochlorothiazide (MAXZIDE-25) 37.5-25 MG tablet Take 1 tablet by mouth daily.     cyclobenzaprine (FLEXERIL) 5 MG tablet Take 1 tablet (5 mg total) by mouth 3 (three) times daily as needed for muscle spasms. 15 tablet 0   traMADol (ULTRAM) 50 MG tablet Take 1 tablet (50 mg total) by mouth every 12 (twelve) hours as needed. 12 tablet 0   No facility-administered medications prior to visit.    PAST MEDICAL HISTORY: Past Medical  History:  Diagnosis Date   Anxiety    Bariatric surgery status    Constipation    Dysrhythmia    Elevated liver enzymes    GERD (gastroesophageal reflux disease)    Hemorrhoids    Herpes genitalis    High cholesterol    Hyperlipidemia    Hypertension    Hypothyroidism    Neuropathy    Osteoarthritis    Sleep apnea     PAST SURGICAL HISTORY: Past Surgical History:  Procedure Laterality Date   ABDOMINAL HYSTERECTOMY     total for fibroids no h/o abnormal pap   bariatric sleeve  2015   BREAST EXCISIONAL BIOPSY Left 1998   carpal tunnel repair     COLONOSCOPY WITH  PROPOFOL N/A 02/11/2016   Procedure: COLONOSCOPY WITH PROPOFOL;  Surgeon: Jonathon Bellows, MD;  Location: [facility] ENDOSCOPY;  Service: Endoscopy;  Laterality: N/A;   COLONOSCOPY WITH PROPOFOL N/A 10/01/2019   Procedure: COLONOSCOPY WITH PROPOFOL;  Surgeon: Jonathon Bellows, MD;  Location: [facility] ENDOSCOPY;  Service: Gastroenterology;  Laterality: N/A;   ESOPHAGOGASTRODUODENOSCOPY (EGD) WITH PROPOFOL N/A 12/02/2019   Procedure: ESOPHAGOGASTRODUODENOSCOPY (EGD) WITH PROPOFOL;  Surgeon: Jonathon Bellows, MD;  Location: [facility] ENDOSCOPY;  Service: Gastroenterology;  Laterality: N/A;   HEMORRHOID SURGERY      FAMILY HISTORY: Family History  Problem Relation Age of Onset   Breast cancer Sister 56       materal 1/2 sister   Hypertension Sister    Hypertension Mother    Heart disease Father    Hypertension Brother     SOCIAL HISTORY: Social History   Socioeconomic History   Marital status: Married    Spouse name: Not on file   Number of children: Not on file   Years of education: Not on file   Highest education level: Not on file  Occupational History   Not on file  Tobacco Use   Smoking status: Former    Packs/day: 1.50    Years: 24.00    Pack years: 36.00    Types: Cigarettes    Quit date: 1993    Years since quitting: 29.7   Smokeless tobacco: Never   Tobacco comments:    quit 1995.   Vaping Use   Vaping Use: Never used  Substance and Sexual Activity   Alcohol use: Not Currently   Drug use: No   Sexual activity: Not Currently    Birth control/protection: Surgical    Comment: Hysterectomy  Other Topics Concern   Not on file  Social History Narrative   Lives in [place].    Married.    Retired 2015, Interior and spatial designer.    One son; granddaughter.    Left handed    Caffeine- decaf coffee.    Social Determinants of Health   Financial Resource Strain: Low Risk    Difficulty of Paying Living Expenses: Not hard at all  Food Insecurity: No Food Insecurity   Worried About Ship broker in the Last Year: Never true   [place] in the Last Year: Never true  Transportation Needs: No Transportation Needs   Lack of Transportation (Medical): No   Lack of Transportation (Non-Medical): No  Physical Activity: Not on file  Stress: No Stress Concern Present   Feeling of Stress : Not at all  Social Connections: Unknown   Frequency of Communication with Friends and Family: More than three times a week   Frequency of Social Gatherings with Friends and Family: More than three times  a week   Attends Religious Services: Not on file   Active Member of Clubs or Organizations: Not on file   Attends Archivist Meetings: Not on file   Marital Status: Not on file  Intimate Partner Violence: Not At Risk   Fear of Current or Ex-Partner: No   Emotionally Abused: No   Physically Abused: No   Sexually Abused: No     PHYSICAL EXAM  GENERAL EXAM/CONSTITUTIONAL: Vitals:  Vitals:   10/14/20 0927  BP: 122/66  Pulse: 69  SpO2: 96%  Weight: 176 lb (79.8 kg)  Height: 5\' 1"  (1.549 m)   Body mass index is 33.25 kg/m. Wt Readings from Last 3 Encounters:  10/14/20 176 lb (79.8 kg)  10/09/20 175 lb (79.4 kg)  10/02/20 180 lb (81.6 kg)   Patient is in no distress; well developed, nourished and groomed; neck is supple  CARDIOVASCULAR: Examination of carotid arteries is normal; no carotid bruits Regular rate and rhythm, no murmurs Examination of peripheral vascular system by observation and palpation is normal  EYES: Ophthalmoscopic exam of optic discs and posterior segments is normal; no papilledema or hemorrhages No results found.  MUSCULOSKELETAL: Gait, strength, tone, movements noted in Neurologic exam below  NEUROLOGIC: MENTAL STATUS:  MMSE - Mini Mental State Exam 08/31/2017  Orientation to time 5  Orientation to Place 5  Registration 3  Attention/ Calculation 5  Recall 3  Language- name 2 objects 2  Language- repeat 1  Language- follow 3 step  command 3  Language- read & follow direction 1  Write a sentence 1  Copy design 1  Total score 30   awake, alert, oriented to person, place and time recent and remote memory intact normal attention and concentration language fluent, comprehension intact, naming intact fund of knowledge appropriate  CRANIAL NERVE:  2nd - no papilledema on fundoscopic exam 2nd, 3rd, 4th, 6th - pupils equal and reactive to light, visual fields full to confrontation, extraocular muscles intact, no nystagmus 5th - facial sensation symmetric 7th - facial strength symmetric 8th - hearing intact 9th - palate elevates symmetrically, uvula midline 11th - shoulder shrug symmetric 12th - tongue protrusion midline  MOTOR:  normal bulk and tone, full strength in the BUE, BLE  SENSORY:  normal and symmetric to light touch, pinprick, temperature, vibration; EXCEPT DECR PP IN DISTAL FEET / TOES  COORDINATION:  finger-nose-finger, fine finger movements normal  REFLEXES:  deep tendon reflexes 2+ and symmetric  GAIT/STATION:  narrow based gait     DIAGNOSTIC DATA (LABS, IMAGING, TESTING) - I reviewed patient records, labs, notes, testing and imaging myself where available.  Lab Results  Component Value Date   WBC 5.3 06/12/2020   HGB 10.9 (L) 06/12/2020   HCT 33.0 (L) 06/12/2020   MCV 86.2 06/12/2020   PLT 136 (L) 06/12/2020   09/22/20 CBC  Ref Range & Units 3 wk ago  WBC (White Blood Cell Count) 3.2 - 9.8 x10^9/L 7.0   Hemoglobin 12.0 - 15.5 g/dL 8.8 Low    Hematocrit 35.0 - 45.0 % 27.7 Low    Platelets 150 - 450 x10^9/L 192        Component Value Date/Time   NA 139 06/12/2020 0633   NA 139 06/26/2013 0409   K 3.4 (L) 06/12/2020 0633   K 4.3 06/26/2013 0409   CL 107 06/12/2020 0633   CL 106 06/26/2013 0409   CO2 23 06/12/2020 0633   CO2 28 06/26/2013 0409   GLUCOSE 82 06/12/2020  9379   GLUCOSE 108 (H) 06/26/2013 0409   BUN 12 06/12/2020 0633   BUN 12 06/26/2013 0409   CREATININE  0.92 06/12/2020 0633   CREATININE 0.72 06/26/2013 0409   CALCIUM 8.4 (L) 06/12/2020 0633   CALCIUM 8.5 06/26/2013 0409   PROT 5.5 (L) 06/12/2020 0633   ALBUMIN 2.8 (L) 06/12/2020 0633   ALBUMIN 3.6 06/26/2013 0409   AST 58 (H) 06/12/2020 0633   ALT 54 (H) 06/12/2020 0633   ALKPHOS 99 06/12/2020 0633   BILITOT 2.3 (H) 06/12/2020 0633   GFRNONAA >60 06/12/2020 0633   GFRNONAA >60 06/26/2013 0409   GFRAA >60 05/24/2019 1420   GFRAA >60 06/26/2013 0409   Lab Results  Component Value Date   CHOL 174 03/10/2020   HDL 52.90 03/10/2020   LDLCALC 98 03/10/2020   LDLDIRECT 90.0 10/03/2016   TRIG 114.0 03/10/2020   CHOLHDL 3 03/10/2020   Lab Results  Component Value Date   HGBA1C 4.9 10/02/2020   Lab Results  Component Value Date   KWIOXBDZ32 992 05/14/2018   Lab Results  Component Value Date   TSH 1.99 03/10/2020    10/05/20 MRI lumbar spine [I reviewed images myself and agree with interpretation. -VRP]  1. Degenerative changes of the lower lumbar spine, more pronounced at the level of the facet joints, not significantly changed from prior MRI. 2. Mild spinal canal stenosis at L4-5. 3. Mild right neural foraminal narrowing at L4-5 and L5-S1.  10/05/20 MRI brain / IAC [I reviewed images myself and agree with interpretation. -VRP]  1. No cerebellopontine angle or internal auditory canal mass lesion or abnormal contrast enhancement. 2. Mild chronic microangiopathy and parenchymal volume loss. 3. A 3 mm hypoenhancing in the anterior pituitary gland, to the left of midline may represent a pituitary cyst versus a micro adenoma.   ASSESSMENT AND PLAN  70 y.o. year old female here with:   Dx:  1. Neuropathy   2. Pituitary lesion ([place])   3. Other general symptoms and signs    4. Abnormal finding of blood chemistry, unspecified    5. 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 - repeat CBC; follow up with PCP and GI colonoscopy   Orders Placed This Encounter  Procedures   CBC with diff   CMP   Vitamin B12   MMA   Homocysteine   A1c   TSH   SPEP with IFE   ANA w/Reflex   SSA, SSB   Vitamin B1   Vitamin B6   Copper   Return in about 6 months (around 04/13/2021) for MyChart visit (78min).    Penni Bombard, MD 05/13/8339, 96:22 AM Certified in Neurology, Neurophysiology and Neuroimaging  [facility] [address] [place], [place] [address] [phone]

## 2020-10-14 NOTE — Patient Instructions (Signed)
  NUMBNESS IN FEET - check neuropathy labs; may consider EMG in future - reduce gabapentin to 300mg  at bedtime (to see if it is helping or not)  INCIDENTAL PITUITARY LESION (18mm; cyst vs microadenoma) -low suspicion for hormonal hyperfunctioning; recommend to repeat MRI brain / pituitary in 6-12 months to ensure stability  ANEMIA / PRIOR HEMORRHOID BLEEDING? - repeat CBC; follow up with PCP and GI colonoscopy

## 2020-10-15 ENCOUNTER — Ambulatory Visit: Payer: Medicare Other

## 2020-10-17 LAB — ANA W/REFLEX: ANA Titer 1: NEGATIVE

## 2020-10-20 LAB — COMPREHENSIVE METABOLIC PANEL
ALT: 24 IU/L (ref 0–32)
AST: 35 IU/L (ref 0–40)
Albumin/Globulin Ratio: 2.1 (ref 1.2–2.2)
Albumin: 4.9 g/dL — ABNORMAL HIGH (ref 3.7–4.7)
Alkaline Phosphatase: 101 IU/L (ref 44–121)
BUN/Creatinine Ratio: 13 (ref 12–28)
BUN: 11 mg/dL (ref 8–27)
Bilirubin Total: 0.3 mg/dL (ref 0.0–1.2)
CO2: 27 mmol/L (ref 20–29)
Calcium: 9.9 mg/dL (ref 8.7–10.3)
Chloride: 96 mmol/L (ref 96–106)
Creatinine, Ser: 0.86 mg/dL (ref 0.57–1.00)
Globulin, Total: 2.3 g/dL (ref 1.5–4.5)
Glucose: 98 mg/dL (ref 70–99)
Potassium: 5 mmol/L (ref 3.5–5.2)
Sodium: 138 mmol/L (ref 134–144)
Total Protein: 7.2 g/dL (ref 6.0–8.5)
eGFR: 72 mL/min/{1.73_m2} (ref >59)

## 2020-10-20 LAB — CBC WITH DIFFERENTIAL/PLATELET
Basophils Absolute: 0.1 10*3/uL (ref 0.0–0.2)
Basos: 1 %
EOS (ABSOLUTE): 0 10*3/uL (ref 0.0–0.4)
Eos: 0 %
Hematocrit: 34.6 % (ref 34.0–46.6)
Hemoglobin: 10.8 g/dL — ABNORMAL LOW (ref 11.1–15.9)
Immature Grans (Abs): 0 10*3/uL (ref 0.0–0.1)
Immature Granulocytes: 0 %
Lymphocytes Absolute: 1.9 10*3/uL (ref 0.7–3.1)
Lymphs: 26 %
MCH: 26.8 pg (ref 26.6–33.0)
MCHC: 31.2 g/dL — ABNORMAL LOW (ref 31.5–35.7)
MCV: 86 fL (ref 79–97)
Monocytes Absolute: 0.8 10*3/uL (ref 0.1–0.9)
Monocytes: 11 %
Neutrophils Absolute: 4.3 10*3/uL (ref 1.4–7.0)
Neutrophils: 62 %
Platelets: 278 10*3/uL (ref 150–450)
RBC: 4.03 x10E6/uL (ref 3.77–5.28)
RDW: 14.6 % (ref 11.7–15.4)
WBC: 7 10*3/uL (ref 3.4–10.8)

## 2020-10-20 LAB — MULTIPLE MYELOMA PANEL, SERUM
Albumin SerPl Elph-Mcnc: 4.1 g/dL (ref 2.9–4.4)
Albumin/Glob SerPl: 1.4 (ref 0.7–1.7)
Alpha 1: 0.2 g/dL (ref 0.0–0.4)
Alpha2 Glob SerPl Elph-Mcnc: 0.7 g/dL (ref 0.4–1.0)
B-Globulin SerPl Elph-Mcnc: 1.2 g/dL (ref 0.7–1.3)
Gamma Glob SerPl Elph-Mcnc: 1 g/dL (ref 0.4–1.8)
Globulin, Total: 3.1 g/dL (ref 2.2–3.9)
IgA/Immunoglobulin A, Serum: 139 mg/dL (ref 64–422)
IgG (Immunoglobin G), Serum: 997 mg/dL (ref 586–1602)
IgM (Immunoglobulin M), Srm: 57 mg/dL (ref 26–217)

## 2020-10-20 LAB — HOMOCYSTEINE: Homocysteine: 29.1 umol/L — ABNORMAL HIGH (ref 0.0–19.2)

## 2020-10-20 LAB — VITAMIN B6: Vitamin B6: 64 ug/L (ref 3.4–65.2)

## 2020-10-20 LAB — SJOGREN'S SYNDROME ANTIBODS(SSA + SSB)
ENA SSA (RO) Ab: <0.2 AI (ref 0.0–0.9)
ENA SSB (LA) Ab: 0.4 AI (ref 0.0–0.9)

## 2020-10-20 LAB — HEMOGLOBIN A1C
Est. average glucose Bld gHb Est-mCnc: 100 mg/dL
Hgb A1c MFr Bld: 5.1 % (ref 4.8–5.6)

## 2020-10-20 LAB — METHYLMALONIC ACID, SERUM: Methylmalonic Acid: 61 nmol/L (ref 0–378)

## 2020-10-20 LAB — VITAMIN B1: Thiamine: 166.6 nmol/L (ref 66.5–200.0)

## 2020-10-20 LAB — VITAMIN B12: Vitamin B-12: 1357 pg/mL — ABNORMAL HIGH (ref 232–1245)

## 2020-10-20 LAB — COPPER, SERUM: Copper: 121 ug/dL (ref 80–158)

## 2020-10-20 LAB — TSH: TSH: 0.916 u[IU]/mL (ref 0.450–4.500)

## 2020-10-22 SURGERY — COLONOSCOPY WITH PROPOFOL
Anesthesia: General

## 2020-10-30 ENCOUNTER — Other Ambulatory Visit: Payer: Self-pay | Admitting: Family

## 2020-11-04 ENCOUNTER — Ambulatory Visit: Payer: Medicare Other | Admitting: Family

## 2020-11-04 DIAGNOSIS — Z9884 Bariatric surgery status: Secondary | ICD-10-CM | POA: Diagnosis not present

## 2020-11-04 DIAGNOSIS — K909 Intestinal malabsorption, unspecified: Secondary | ICD-10-CM | POA: Diagnosis not present

## 2020-11-04 DIAGNOSIS — D649 Anemia, unspecified: Secondary | ICD-10-CM | POA: Diagnosis not present

## 2020-11-11 ENCOUNTER — Other Ambulatory Visit: Payer: Self-pay

## 2020-11-11 ENCOUNTER — Ambulatory Visit (INDEPENDENT_AMBULATORY_CARE_PROVIDER_SITE_OTHER): Payer: Medicare Other | Admitting: Family

## 2020-11-11 ENCOUNTER — Encounter: Payer: Self-pay | Admitting: Family

## 2020-11-11 VITALS — BP 130/64 | HR 74 | Temp 98.3°F | Ht 61.0 in | Wt 178.8 lb

## 2020-11-11 DIAGNOSIS — G629 Polyneuropathy, unspecified: Secondary | ICD-10-CM | POA: Diagnosis not present

## 2020-11-11 DIAGNOSIS — I10 Essential (primary) hypertension: Secondary | ICD-10-CM

## 2020-11-11 DIAGNOSIS — F419 Anxiety disorder, unspecified: Secondary | ICD-10-CM | POA: Diagnosis not present

## 2020-11-11 DIAGNOSIS — F32A Depression, unspecified: Secondary | ICD-10-CM

## 2020-11-11 DIAGNOSIS — M545 Low back pain, unspecified: Secondary | ICD-10-CM

## 2020-11-11 DIAGNOSIS — D649 Anemia, unspecified: Secondary | ICD-10-CM | POA: Diagnosis not present

## 2020-11-11 MED ORDER — GABAPENTIN 300 MG PO CAPS: 300.0000 mg | ORAL_CAPSULE | Freq: Two times a day (BID) | ORAL | 2 refills | Status: DC

## 2020-11-11 MED ORDER — HYDROCORTISONE ACETATE 25 MG RE SUPP: 25.0000 mg | Freq: Two times a day (BID) | RECTAL | 1 refills | Status: DC

## 2020-11-11 MED ORDER — SERTRALINE HCL 100 MG PO TABS: 100.0000 mg | ORAL_TABLET | Freq: Every day | ORAL | 1 refills | Status: DC

## 2020-11-11 NOTE — Assessment & Plan Note (Signed)
Improved at this time.  Patient politely declines any further evaluation, treatment for pain.  She will let me know if pain were to recur at that point would consider consult with Dr. Leigh Aurora Dr. Roland Rack

## 2020-11-11 NOTE — Progress Notes (Signed)
Subjective:    Patient ID: Jennifer [name], female    DOB: 05-May-1950, 70 y.o.   MRN: [id number]  CC: Jennifer [name] is a 70 y.o. female who presents today for follow up.   HPI: .Complains of BRB in stool and on toilet paper. She is using preparation H prn. She requests anusol  She endorses hemorrhoids. Stool is loose. She uses laxative prn approx weekly.  No constipation, abdominal pain.   Anemia- compliant with bariatric vitamin with iron. Ferrous fumerate 135mg    Following with Dr. Darnell Level for intestinal malabsorption She has consult with [name] 12/02/20.  Colonoscopy is not longer scheduled and [name] would like to see patient. She will need EGD   Peripheral neuropathy-compliant gabapentin 300mg  BID which was decreased last month and continues to be helpful. Considering EMG by Dr Leta Baptist, neurology, last seen 10/14/20 He will also repeat MRI brain in 6 months time.   History of gastric sleeve allergic surgery 2014. She had follow-up with Dr. Manuella Ghazi for cystic duct leak 09/22/20. Advised to follow up PRN  HTN- compliant with losartan 100 mg, triamterene hydrochlorothiazide 37.5-25 mg as prescribed by Dr. Pryor Ochoa, ENT.  Renal function crt 1.0 labs one month ago  Anxiety depression-started Zoloft 50mg  at last visit. She is having trouble falling asleep.   Low back pain-Back pain has improved and 'not bothering her too much'. It is 'dormant right now. ' She declines seeing Dr Sharlet Salina, Dr Roland Rack    HISTORY:  Past Medical History:  Diagnosis Date   Anxiety    Bariatric surgery status    Constipation    Dysrhythmia    Elevated liver enzymes    GERD (gastroesophageal reflux disease)    Hemorrhoids    Herpes genitalis    High cholesterol    Hyperlipidemia    Hypertension    Hypothyroidism    Neuropathy    Osteoarthritis    Sleep apnea    Past Surgical History:  Procedure Laterality Date   ABDOMINAL HYSTERECTOMY     total for fibroids no h/o abnormal pap    bariatric sleeve  2015   BREAST EXCISIONAL BIOPSY Left 1998   carpal tunnel repair     COLONOSCOPY WITH PROPOFOL N/A 02/11/2016   Procedure: COLONOSCOPY WITH PROPOFOL;  Surgeon: Jonathon Bellows, MD;  Location: [facility] ENDOSCOPY;  Service: Endoscopy;  Laterality: N/A;   COLONOSCOPY WITH PROPOFOL N/A 10/01/2019   Procedure: COLONOSCOPY WITH PROPOFOL;  Surgeon: Jonathon Bellows, MD;  Location: [facility] ENDOSCOPY;  Service: Gastroenterology;  Laterality: N/A;   ESOPHAGOGASTRODUODENOSCOPY (EGD) WITH PROPOFOL N/A 12/02/2019   Procedure: ESOPHAGOGASTRODUODENOSCOPY (EGD) WITH PROPOFOL;  Surgeon: Jonathon Bellows, MD;  Location: [facility] ENDOSCOPY;  Service: Gastroenterology;  Laterality: N/A;   HEMORRHOID SURGERY     Family History  Problem Relation Age of Onset   Breast cancer Sister 66       materal 1/2 sister   Hypertension Sister    Hypertension Mother    Heart disease Father    Hypertension Brother     Allergies: Celecoxib, Hydrocodone, Pollen extract, Nasacort [triamcinolone], and Vicodin [hydrocodone-acetaminophen] Current Outpatient Medications on File Prior to Visit  Medication Sig Dispense Refill   acetaminophen (TYLENOL) 325 MG tablet Take 2 tablets (650 mg total) by mouth every 6 (six) hours as needed for mild pain (or Fever >/= 101).     acyclovir (ZOVIRAX) 400 MG tablet Take 400 mg by mouth daily.     famotidine (PEPCID) 20 MG tablet TAKE 1 TABLET BY MOUTH EVERY NIGHT AT BEDTIME  90 tablet 1   levothyroxine (SYNTHROID) 50 MCG tablet TAKE 1 TABLET BY MOUTH EVERY DAY 90 tablet 1   losartan (COZAAR) 100 MG tablet TAKE 1 TABLET BY MOUTH EVERY DAY AT BEDTIME 90 tablet 0   triamterene-hydrochlorothiazide (MAXZIDE-25) 37.5-25 MG tablet Take 1 tablet by mouth daily.     No current facility-administered medications on file prior to visit.    Social History   Tobacco Use   Smoking status: Former    Packs/day: 1.50    Years: 24.00    Pack years: 36.00    Types: Cigarettes    Quit date: 1993    Years since  quitting: 29.8   Smokeless tobacco: Never   Tobacco comments:    quit 1995.   Vaping Use   Vaping Use: Never used  Substance Use Topics   Alcohol use: Not Currently   Drug use: No    Review of Systems  Constitutional:  Negative for chills and fever.  Respiratory:  Negative for cough.   Cardiovascular:  Negative for chest pain and palpitations.  Gastrointestinal:  Positive for anal bleeding and blood in stool. Negative for abdominal pain, nausea and vomiting.  Musculoskeletal:  Negative for back pain.  Psychiatric/Behavioral:  Positive for sleep disturbance. The patient is not nervous/anxious.      Objective:    BP 130/64 (BP Location: Left Arm, Patient Position: Sitting, Cuff Size: Large)   Pulse 74   Temp 98.3 F (36.8 C) (Oral)   Ht 5\' 1"  (1.549 m)   Wt 178 lb 12.8 oz (81.1 kg)   SpO2 96%   BMI 33.78 kg/m  BP Readings from Last 3 Encounters:  11/11/20 130/64  10/14/20 122/66  10/09/20 (!) 160/73   Wt Readings from Last 3 Encounters:  11/11/20 178 lb 12.8 oz (81.1 kg)  10/14/20 176 lb (79.8 kg)  10/09/20 175 lb (79.4 kg)    Physical Exam Vitals reviewed.  Constitutional:      Appearance: Normal appearance. She is well-developed.  Eyes:     Conjunctiva/sclera: Conjunctivae normal.  Cardiovascular:     Rate and Rhythm: Normal rate and regular rhythm.     Pulses: Normal pulses.     Heart sounds: Normal heart sounds.  Pulmonary:     Effort: Pulmonary effort is normal.     Breath sounds: Normal breath sounds. No wheezing, rhonchi or rales.  Abdominal:     General: Bowel sounds are normal. There is no distension.     Palpations: Abdomen is soft. Abdomen is not rigid. There is no fluid wave or mass.     Tenderness: There is no abdominal tenderness. There is no guarding or rebound.  Skin:    General: Skin is warm and dry.  Neurological:     Mental Status: She is alert.  Psychiatric:        Speech: Speech normal.        Behavior: Behavior normal.         Thought Content: Thought content normal.       Assessment & Plan:   Problem List Items Addressed This Visit       Cardiovascular and Mediastinum   Essential hypertension    Chronic, stable.  Continue losartan 100 mg, triamterene hydrochlorothiazide 37.5-25 mg         Nervous and Auditory   Neuropathy    Chronic, stable.  Continue gabapentin 300mg  BID      Relevant Medications   gabapentin (NEURONTIN) 300 MG capsule  Other   Anemia - Primary    Concern for malabsorption in the setting of gastric sleeve.  She also has chronic red bleeding from the rectum, history of hemorrhoids.  Emphasized the importance of colonoscopy to evaluate for malignancy.  Patient declines rectal exam in the office today and requesting refill of Anusol suppositories which have provided. Ferritin 10.3 one week ago.  Normocytic.  Hemoglobin 10.8. continue Ferrous fumerate 135mg  for now however advised patient today that most likely she would benefit from IV iron infusion.  She quite declines my ranging with hematology in [place] as she has a follow-up with Dr. Darnell Level, bariatric surgery in [place] in December of this year.  She will discuss with him.  Upcoming appointment with Dr. [name] 12/02/2020 to discuss anemia , EGD, colonscopy.       Relevant Medications   hydrocortisone (ANUSOL-HC) 25 MG suppository   Anxiety and depression    She reports unchanged after starting Zoloft 50 mg.  She continues to struggle with falling and staying asleep.  Increase zoloft  to 100 mg.  Close follow-up      Relevant Medications   sertraline (ZOLOFT) 100 MG tablet   Lumbar back pain    Improved at this time.  Patient politely declines any further evaluation, treatment for pain.  She will let me know if pain were to recur at that point would consider consult with Dr. Leigh Aurora Dr. Roland Rack        I have discontinued [name] Barron's gabapentin and Allergy Relief. I have also changed her sertraline and gabapentin.  Additionally, I am having her start on hydrocortisone. Lastly, I am having her maintain her acetaminophen, acyclovir, triamterene-hydrochlorothiazide, famotidine, levothyroxine, and losartan.   Meds ordered this encounter  Medications   sertraline (ZOLOFT) 100 MG tablet    Sig: Take 1 tablet (100 mg total) by mouth at bedtime.    Dispense:  90 tablet    Refill:  1    Order Specific Question:   Supervising Provider    Answer:   Deborra Medina L [[id number]]   gabapentin (NEURONTIN) 300 MG capsule    Sig: Take 1 capsule (300 mg total) by mouth 2 (two) times daily.    Dispense:  120 capsule    Refill:  2    Order Specific Question:   Supervising Provider    Answer:   Crecencio Mc [[id number]]   hydrocortisone (ANUSOL-HC) 25 MG suppository    Sig: Place 1 suppository (25 mg total) rectally 2 (two) times daily.    Dispense:  12 suppository    Refill:  1    Order Specific Question:   Supervising Provider    Answer:   Crecencio Mc [[id number]]    Return precautions given.   Risks, benefits, and alternatives of the medications and treatment plan prescribed today were discussed, and patient expressed understanding.   Education regarding symptom management and diagnosis given to patient on AVS.  Continue to follow with [name], FNP for routine health maintenance.   Jennifer [name] and I agreed with plan.   [name], FNP

## 2020-11-11 NOTE — Assessment & Plan Note (Addendum)
Concern for malabsorption in the setting of gastric sleeve.  She also has chronic red bleeding from the rectum, history of hemorrhoids.  Emphasized the importance of colonoscopy to evaluate for malignancy.  Patient declines rectal exam in the office today and requesting refill of Anusol suppositories which have provided. Ferritin 10.3 one week ago.  Normocytic.  Hemoglobin 10.8. continue Ferrous fumerate 135mg  for now however advised patient today that most likely she would benefit from IV iron infusion.  She quite declines my ranging with hematology in [place] as she has a follow-up with Dr. Darnell Level, bariatric surgery in [place] in December of this year.  She will discuss with him.  Upcoming appointment with Dr. [name] 12/02/2020 to discuss anemia , EGD, colonscopy.

## 2020-11-11 NOTE — Assessment & Plan Note (Signed)
She reports unchanged after starting Zoloft 50 mg.  She continues to struggle with falling and staying asleep.  Increase zoloft  to 100 mg.  Close follow-up

## 2020-11-11 NOTE — Patient Instructions (Addendum)
May trial claritin ( plain) over the counter for allergies to see if better than allegra. Do not take BOTH allegra and claritin.   Increase Zoloft to 100 mg to see if this is more effective for sleep.  Please let me know how you are doing.  Please keep upcoming appointment with Dr. [name] and be sure to discuss rectal bleeding, need for colonoscopy and endoscopy.

## 2020-11-13 NOTE — Assessment & Plan Note (Signed)
Chronic, stable. Continue gabapentin 300mg BID.  

## 2020-11-13 NOTE — Assessment & Plan Note (Signed)
Chronic, stable.  Continue losartan 100 mg, triamterene hydrochlorothiazide 37.5-25 mg

## 2020-11-16 ENCOUNTER — Other Ambulatory Visit: Payer: Self-pay

## 2020-11-16 ENCOUNTER — Emergency Department
Admission: EM | Admit: 2020-11-16 | Discharge: 2020-11-16 | Disposition: A | Discharge status: Home / Self Care | Payer: Medicare Other | Source: Home / Self Care

## 2020-11-16 ENCOUNTER — Telehealth: Payer: Self-pay | Admitting: Family

## 2020-11-16 ENCOUNTER — Telehealth: Payer: Self-pay

## 2020-11-16 ENCOUNTER — Emergency Department: Payer: Medicare Other

## 2020-11-16 DIAGNOSIS — Z5321 Procedure and treatment not carried out due to patient leaving prior to being seen by health care provider: Secondary | ICD-10-CM | POA: Insufficient documentation

## 2020-11-16 DIAGNOSIS — R0602 Shortness of breath: Secondary | ICD-10-CM | POA: Diagnosis not present

## 2020-11-16 DIAGNOSIS — D649 Anemia, unspecified: Secondary | ICD-10-CM | POA: Diagnosis not present

## 2020-11-16 DIAGNOSIS — E039 Hypothyroidism, unspecified: Secondary | ICD-10-CM | POA: Diagnosis not present

## 2020-11-16 DIAGNOSIS — I10 Essential (primary) hypertension: Secondary | ICD-10-CM | POA: Diagnosis not present

## 2020-11-16 DIAGNOSIS — K922 Gastrointestinal hemorrhage, unspecified: Secondary | ICD-10-CM | POA: Diagnosis not present

## 2020-11-16 DIAGNOSIS — K909 Intestinal malabsorption, unspecified: Secondary | ICD-10-CM | POA: Diagnosis not present

## 2020-11-16 DIAGNOSIS — Z20822 Contact with and (suspected) exposure to covid-19: Secondary | ICD-10-CM | POA: Diagnosis not present

## 2020-11-16 DIAGNOSIS — D509 Iron deficiency anemia, unspecified: Secondary | ICD-10-CM | POA: Diagnosis not present

## 2020-11-16 DIAGNOSIS — K921 Melena: Secondary | ICD-10-CM | POA: Diagnosis not present

## 2020-11-16 LAB — CBC
HCT: 22.7 % — ABNORMAL LOW (ref 36.0–46.0)
Hemoglobin: 6.9 g/dL — ABNORMAL LOW (ref 12.0–15.0)
MCH: 25.7 pg — ABNORMAL LOW (ref 26.0–34.0)
MCHC: 30.4 g/dL (ref 30.0–36.0)
MCV: 84.4 fL (ref 80.0–100.0)
Platelets: 297 10*3/uL (ref 150–400)
RBC: 2.69 MIL/uL — ABNORMAL LOW (ref 3.87–5.11)
RDW: 17.9 % — ABNORMAL HIGH (ref 11.5–15.5)
WBC: 6.6 10*3/uL (ref 4.0–10.5)
nRBC: 1.1 % — ABNORMAL HIGH (ref 0.0–0.2)

## 2020-11-16 LAB — BASIC METABOLIC PANEL
Anion gap: 7 (ref 5–15)
BUN: 13 mg/dL (ref 8–23)
CO2: 28 mmol/L (ref 22–32)
Calcium: 9 mg/dL (ref 8.9–10.3)
Chloride: 105 mmol/L (ref 98–111)
Creatinine, Ser: 0.65 mg/dL (ref 0.44–1.00)
GFR, Estimated: >60 mL/min (ref >60)
Glucose, Bld: 108 mg/dL — ABNORMAL HIGH (ref 70–99)
Potassium: 3.8 mmol/L (ref 3.5–5.1)
Sodium: 140 mmol/L (ref 135–145)

## 2020-11-16 NOTE — Telephone Encounter (Signed)
Pt called & stated that she has been at ED since 9am. She had labs don e7 EKG. She said that she was told it would be a long time before she was able to be seen due to patient volume there. She was told by RN patient stated that her vitals are stable, so it ws up to her if she wanted to stay for the wait. Pt wanted clearance from you to l ave. I explained that you were not in office & I did not feel you would be comfortable telling her to leave before being actually seen. Pt stated that she was now  not feeling well due to not yet eating anything. She had not heard from her labs yet. She wanted you to look at. I again advised that you were out of the office & her leaving was against advise.

## 2020-11-16 NOTE — Telephone Encounter (Signed)
Pt called in requesting to speak with Dr. Vidal Schwalbe about having iron infusion. Pt stated that she have no energy to go to [facility] for iron infusion. Pt would like to start iron infusion as soon as possible. Pt is having symptoms of shortness of breathe.[name] KitchenMarland KitchenMarland Kitchen

## 2020-11-16 NOTE — ED Triage Notes (Signed)
Pt to ED for shob for the past few days with exertion. Denies cp .  Speaking in complete sentences, RR even and unlabored

## 2020-11-16 NOTE — Telephone Encounter (Signed)
Pt called & stated that she was feeling poorly so she did want to pursue the iron infusions. I advised that I thought that was supposed to be set up by her bariatric surgeon? I asked that she reach out to them as well. Did you guys discuss her doing this here?

## 2020-11-16 NOTE — Telephone Encounter (Signed)
I called patient to triage her symptoms. Pt stated that this weekend she started to feel SOB upon exertion. She stated last week that she was feeling a lot of fatigue. Now she has declined worse to feeling SOB even when walking from home to car. She stated that she has to stop & rest. I asked if she had pulse ox & could check her oxygen. She stated that her husband did have one. I asked her as well about her hydration level & she stated that she is drinking 6-8 bottles of water daily, so patient should be hydrated with that amount. I recommended since PCP not hear & did not know all factors playing a roll that she needed to be seen ASAP at ED for evaluation. She asked if she could wait & advised that she should not wait. Pt stated that she would go be seen today.

## 2020-11-17 ENCOUNTER — Telehealth: Payer: Self-pay

## 2020-11-17 ENCOUNTER — Other Ambulatory Visit: Payer: Self-pay

## 2020-11-17 ENCOUNTER — Inpatient Hospital Stay
Admission: EM | Admit: 2020-11-17 | Discharge: 2020-11-20 | DRG: 378 | Disposition: A | Discharge status: Home / Self Care | Payer: Medicare Other | Attending: Family Medicine | Admitting: Family Medicine

## 2020-11-17 DIAGNOSIS — Z79890 Hormone replacement therapy (postmenopausal): Secondary | ICD-10-CM

## 2020-11-17 DIAGNOSIS — Z9884 Bariatric surgery status: Secondary | ICD-10-CM

## 2020-11-17 DIAGNOSIS — K219 Gastro-esophageal reflux disease without esophagitis: Secondary | ICD-10-CM | POA: Diagnosis present

## 2020-11-17 DIAGNOSIS — Z87891 Personal history of nicotine dependence: Secondary | ICD-10-CM | POA: Diagnosis not present

## 2020-11-17 DIAGNOSIS — K921 Melena: Secondary | ICD-10-CM | POA: Diagnosis present

## 2020-11-17 DIAGNOSIS — A6009 Herpesviral infection of other urogenital tract: Secondary | ICD-10-CM | POA: Diagnosis present

## 2020-11-17 DIAGNOSIS — Z885 Allergy status to narcotic agent status: Secondary | ICD-10-CM | POA: Diagnosis not present

## 2020-11-17 DIAGNOSIS — K649 Unspecified hemorrhoids: Secondary | ICD-10-CM | POA: Diagnosis not present

## 2020-11-17 DIAGNOSIS — I10 Essential (primary) hypertension: Secondary | ICD-10-CM | POA: Diagnosis present

## 2020-11-17 DIAGNOSIS — E039 Hypothyroidism, unspecified: Secondary | ICD-10-CM | POA: Diagnosis present

## 2020-11-17 DIAGNOSIS — K909 Intestinal malabsorption, unspecified: Secondary | ICD-10-CM | POA: Diagnosis present

## 2020-11-17 DIAGNOSIS — E7800 Pure hypercholesterolemia, unspecified: Secondary | ICD-10-CM | POA: Diagnosis present

## 2020-11-17 DIAGNOSIS — K922 Gastrointestinal hemorrhage, unspecified: Secondary | ICD-10-CM | POA: Diagnosis present

## 2020-11-17 DIAGNOSIS — Z888 Allergy status to other drugs, medicaments and biological substances status: Secondary | ICD-10-CM | POA: Diagnosis not present

## 2020-11-17 DIAGNOSIS — G4730 Sleep apnea, unspecified: Secondary | ICD-10-CM | POA: Diagnosis present

## 2020-11-17 DIAGNOSIS — Z20822 Contact with and (suspected) exposure to covid-19: Secondary | ICD-10-CM | POA: Diagnosis present

## 2020-11-17 DIAGNOSIS — G629 Polyneuropathy, unspecified: Secondary | ICD-10-CM | POA: Diagnosis present

## 2020-11-17 DIAGNOSIS — K2990 Gastroduodenitis, unspecified, without bleeding: Secondary | ICD-10-CM | POA: Diagnosis not present

## 2020-11-17 DIAGNOSIS — Z79899 Other long term (current) drug therapy: Secondary | ICD-10-CM | POA: Diagnosis not present

## 2020-11-17 DIAGNOSIS — D509 Iron deficiency anemia, unspecified: Secondary | ICD-10-CM | POA: Diagnosis not present

## 2020-11-17 DIAGNOSIS — R0602 Shortness of breath: Secondary | ICD-10-CM | POA: Diagnosis not present

## 2020-11-17 DIAGNOSIS — T39395A Adverse effect of other nonsteroidal anti-inflammatory drugs [NSAID], initial encounter: Secondary | ICD-10-CM | POA: Diagnosis present

## 2020-11-17 DIAGNOSIS — F419 Anxiety disorder, unspecified: Secondary | ICD-10-CM | POA: Diagnosis present

## 2020-11-17 DIAGNOSIS — D649 Anemia, unspecified: Secondary | ICD-10-CM | POA: Diagnosis not present

## 2020-11-17 DIAGNOSIS — K6389 Other specified diseases of intestine: Secondary | ICD-10-CM | POA: Diagnosis not present

## 2020-11-17 DIAGNOSIS — Z8249 Family history of ischemic heart disease and other diseases of the circulatory system: Secondary | ICD-10-CM | POA: Diagnosis not present

## 2020-11-17 LAB — RESP PANEL BY RT-PCR (FLU A&B, COVID) ARPGX2
Influenza A by PCR: NEGATIVE
Influenza B by PCR: NEGATIVE
SARS Coronavirus 2 by RT PCR: NEGATIVE

## 2020-11-17 LAB — BASIC METABOLIC PANEL
Anion gap: 11 (ref 5–15)
BUN: 10 mg/dL (ref 8–23)
CO2: 26 mmol/L (ref 22–32)
Calcium: 9.1 mg/dL (ref 8.9–10.3)
Chloride: 101 mmol/L (ref 98–111)
Creatinine, Ser: 0.68 mg/dL (ref 0.44–1.00)
GFR, Estimated: >60 mL/min (ref >60)
Glucose, Bld: 118 mg/dL — ABNORMAL HIGH (ref 70–99)
Potassium: 3.7 mmol/L (ref 3.5–5.1)
Sodium: 138 mmol/L (ref 135–145)

## 2020-11-17 LAB — CBC WITH DIFFERENTIAL/PLATELET
Abs Immature Granulocytes: 0.01 10*3/uL (ref 0.00–0.07)
Basophils Absolute: 0.1 10*3/uL (ref 0.0–0.1)
Basophils Relative: 1 %
Eosinophils Absolute: 0.1 10*3/uL (ref 0.0–0.5)
Eosinophils Relative: 1 %
HCT: 23.7 % — ABNORMAL LOW (ref 36.0–46.0)
Hemoglobin: 7.4 g/dL — ABNORMAL LOW (ref 12.0–15.0)
Immature Granulocytes: 0 %
Lymphocytes Relative: 41 %
Lymphs Abs: 2.4 10*3/uL (ref 0.7–4.0)
MCH: 25.8 pg — ABNORMAL LOW (ref 26.0–34.0)
MCHC: 31.2 g/dL (ref 30.0–36.0)
MCV: 82.6 fL (ref 80.0–100.0)
Monocytes Absolute: 0.6 10*3/uL (ref 0.1–1.0)
Monocytes Relative: 11 %
Neutro Abs: 2.7 10*3/uL (ref 1.7–7.7)
Neutrophils Relative %: 46 %
Platelets: 349 10*3/uL (ref 150–400)
RBC: 2.87 MIL/uL — ABNORMAL LOW (ref 3.87–5.11)
RDW: 18 % — ABNORMAL HIGH (ref 11.5–15.5)
WBC: 5.8 10*3/uL (ref 4.0–10.5)
nRBC: 0.7 % — ABNORMAL HIGH (ref 0.0–0.2)

## 2020-11-17 LAB — HEMOGLOBIN AND HEMATOCRIT, BLOOD
HCT: 22.9 % — ABNORMAL LOW (ref 36.0–46.0)
Hemoglobin: 6.8 g/dL — ABNORMAL LOW (ref 12.0–15.0)

## 2020-11-17 MED ORDER — ACETAMINOPHEN 500 MG PO TABS
1000.0000 mg | ORAL_TABLET | Freq: Once | ORAL | Status: AC
  Administered 2020-11-17: 1000 mg via ORAL
  Filled 2020-11-17: qty 2

## 2020-11-17 MED ORDER — LEVOTHYROXINE SODIUM 50 MCG PO TABS
50.0000 ug | ORAL_TABLET | Freq: Every day | ORAL | Status: DC
  Administered 2020-11-18 – 2020-11-20 (×2): 50 ug via ORAL
  Filled 2020-11-17 (×2): qty 1

## 2020-11-17 MED ORDER — LORATADINE 10 MG PO TABS
10.0000 mg | ORAL_TABLET | Freq: Every day | ORAL | Status: DC
  Administered 2020-11-17 – 2020-11-20 (×4): 10 mg via ORAL
  Filled 2020-11-17 (×4): qty 1

## 2020-11-17 MED ORDER — SERTRALINE HCL 50 MG PO TABS
100.0000 mg | ORAL_TABLET | Freq: Every day | ORAL | Status: DC
  Administered 2020-11-17 – 2020-11-19 (×3): 100 mg via ORAL
  Filled 2020-11-17 (×3): qty 2

## 2020-11-17 MED ORDER — ACETAMINOPHEN 650 MG RE SUPP
650.0000 mg | Freq: Four times a day (QID) | RECTAL | Status: DC | PRN
  Filled 2020-11-17: qty 1

## 2020-11-17 MED ORDER — HYDROCORTISONE ACETATE 25 MG RE SUPP
25.0000 mg | Freq: Two times a day (BID) | RECTAL | Status: DC
  Filled 2020-11-17 (×7): qty 1

## 2020-11-17 MED ORDER — PANTOPRAZOLE 80MG IVPB - SIMPLE MED
80.0000 mg | Freq: Once | INTRAVENOUS | Status: DC
  Filled 2020-11-17: qty 100

## 2020-11-17 MED ORDER — ACYCLOVIR 200 MG PO CAPS
400.0000 mg | ORAL_CAPSULE | Freq: Every day | ORAL | Status: DC
  Administered 2020-11-18 – 2020-11-20 (×3): 400 mg via ORAL
  Filled 2020-11-17 (×3): qty 2

## 2020-11-17 MED ORDER — GABAPENTIN 300 MG PO CAPS
300.0000 mg | ORAL_CAPSULE | Freq: Two times a day (BID) | ORAL | Status: DC
  Administered 2020-11-17 – 2020-11-20 (×6): 300 mg via ORAL
  Filled 2020-11-17 (×6): qty 1

## 2020-11-17 MED ORDER — SODIUM CHLORIDE 0.9 % IV SOLN
10.0000 mL/h | Freq: Once | INTRAVENOUS | Status: AC
  Administered 2020-11-17: 10 mL/h via INTRAVENOUS

## 2020-11-17 MED ORDER — PANTOPRAZOLE SODIUM 40 MG IV SOLR: 40.0000 mg | Freq: Two times a day (BID) | INTRAVENOUS | Status: DC

## 2020-11-17 MED ORDER — ACETAMINOPHEN 325 MG PO TABS
650.0000 mg | ORAL_TABLET | Freq: Four times a day (QID) | ORAL | Status: DC | PRN
  Administered 2020-11-19: 650 mg via ORAL
  Filled 2020-11-17: qty 2

## 2020-11-17 MED ORDER — PANTOPRAZOLE INFUSION (NEW) - SIMPLE MED
8.0000 mg/h | INTRAVENOUS | Status: DC
  Filled 2020-11-17: qty 100

## 2020-11-17 MED ORDER — LOSARTAN POTASSIUM 50 MG PO TABS
100.0000 mg | ORAL_TABLET | Freq: Every day | ORAL | Status: DC
  Administered 2020-11-17 – 2020-11-18 (×2): 100 mg via ORAL
  Filled 2020-11-17 (×3): qty 2

## 2020-11-17 MED ORDER — PANTOPRAZOLE SODIUM 40 MG IV SOLR
40.0000 mg | Freq: Once | INTRAVENOUS | Status: AC
  Administered 2020-11-17: 40 mg via INTRAVENOUS
  Filled 2020-11-17: qty 40

## 2020-11-17 NOTE — Telephone Encounter (Signed)
I spoke with patient She is feeling tired and cold. She is not SOB when she is walking in house.   BRB from rectum has resolved over the past 7 days. No abdominal pain or fever.   Compliant with Ferrous fumerate 135mg .  Hemoglobin is VERY low at 6.9 and dropped from 10 to 6.9 in 4 weeks time. I am very concerned.   She is approaching level for transfusion .  I am so very sorry for wait in ED and I can make a referral for Iron infusion however she may need blood transfusion.   Please call Dr Synthia Innocent office and once you get him on phone, I would like to speak with him

## 2020-11-17 NOTE — Assessment & Plan Note (Signed)
BP has been a little high today in the ED. She will be continued on losartan 100 mg daily as at home. Monitor.

## 2020-11-17 NOTE — Telephone Encounter (Signed)
Consulted with Dr. Jonathon Bellows gastroenterology via secure chat. We jointly agreed patient would need blood products, colonoscopy.  I called patient back to advise her of this.  Currently awaiting callback from Dr. Darnell Level.  Patient was agreeable to returning to [facility] emergency room today. I  called [facility] nurse triage they are aware patient is coming by private vehicle.  Will follow

## 2020-11-17 NOTE — Assessment & Plan Note (Signed)
Due to intestinal malabsorption related to the patient's gastric sleeve procedure and recent redo of this procedure. She is on oral iron supplements as outpatient.

## 2020-11-17 NOTE — ED Provider Notes (Signed)
[facility] Emergency Department Provider Note  ____________________________________________   Event Date/Time   First MD Initiated Contact with Patient 11/17/20 1624     (approximate)  I have reviewed the triage vital signs and the nursing notes.   HISTORY  Chief Complaint Shortness of Breath   HPI Jennifer [name] is a 70 y.o. female with past medical history of HTN, HDL, hemorrhoids, GERD, constipation, anxiety gastric sleeve status post revision on 05/16/2019 and a cholecystectomy in 1610 complicated by bile leak as well as anemia thought to be due to malabsorption possibly possibly some GI bleeding followed by her PCP outpatient currently on iron p.o. supplementation with plans for colonoscopy on November 11 2 presents for assessment after being referred to the ED by PCP with concerns for low hemoglobin causing symptomatic anemia and need for further evaluation management.  Patient states he has been feeling more fatigued and short of breath especially with exertion over the past week.  She states that she had some bright red blood in her poop about 2 weeks ago and that over the last several weeks he has had intermittent black poops.  She states he has not had any abdominal pain last couple days and endorsed some chronic low back pain without any change in last couple years.  She has no new cough, fevers, chest pain, headedness, dizziness, loss of consciousness or other clear sick symptoms.  He states he sometimes takes a baby aspirin but her PCP to stop taking this and otherwise is not on any blood thinners.         Past Medical History:  Diagnosis Date   Anxiety    Bariatric surgery status    Constipation    Dysrhythmia    Elevated liver enzymes    GERD (gastroesophageal reflux disease)    Hemorrhoids    Herpes genitalis    High cholesterol    Hyperlipidemia    Hypertension    Hypothyroidism    Neuropathy    Osteoarthritis    Sleep apnea      Patient Active Problem List   Diagnosis Date Noted   Anemia 11/11/2020   Common bile duct leak    Right upper quadrant abdominal pain 06/10/2020   Lumbar back pain 05/18/2020   Muscle spasm 05/18/2020   Bilateral tinnitus 04/30/2020   Headache 03/30/2020   Irregular heartbeat 02/25/2020   Diabetes mellitus without complication ([place]) 96/04/5407   Aspirin long-term use 10/22/2019   Wound ballistics 08/29/2019   Displacement of lumbar intervertebral disc without myelopathy 81/19/1478   Helicobacter pylori gastrointestinal tract infection 08/29/2019   Hypercholesterolemia 08/29/2019   Phlebitis after infusion 06/26/2019   Superficial thrombophlebitis of left upper extremity 06/26/2019   BMI 38.0-38.9,adult 06/19/2019   Small bowel obstruction ([place]) 05/25/2019   Morbid obesity ([place]) 05/16/2019   Cervical pain (neck) 04/09/2019   Pedal edema 03/06/2019   Pre-operative clearance 03/06/2019   Pacemaker 02/01/2019   History of sleeve gastrectomy 01/23/2019   BMI 40.0-44.9, adult ([place]) 11/14/2018   Chronic pain syndrome 10/03/2018   Obstructive sleep apnea 10/03/2018   Right shoulder pain 06/08/2018   Chronic venous insufficiency 05/24/2018   Lymphedema 05/24/2018   Lumbar spondylosis 05/17/2018   Leg swelling 05/09/2018   Neuropathy 01/24/2018   Insomnia 07/12/2017   Fracture of phalanx of finger 05/31/2017   Impingement syndrome of shoulder region 05/31/2017   Constipation 12/15/2016   Hemorrhoids 12/15/2016   Hypothyroidism 10/03/2016   Chronic shoulder bursitis 08/30/2016   Positive ANA (antinuclear  antibody) 08/30/2016   Bradycardia 06/16/2016   History of adenomatous polyp of colon 05/02/2016   Elevated liver enzymes 04/06/2016   OSA (obstructive sleep apnea) 04/06/2016   Bilateral shoulder pain 01/13/2016   Fatty liver 12/08/2015   Arthritis 12/08/2015   History of bariatric surgery 12/07/2015   Genital herpes 11/11/2015   Hyperlipidemia 11/11/2015   Essential  hypertension 11/11/2015   Routine physical examination 11/11/2015   Allergic rhinitis 11/11/2015   GERD (gastroesophageal reflux disease) 11/11/2015   Herpesviral infection, unspecified 01/08/2014   Disorder of thyroid, unspecified 01/08/2014   Lumbar radiculitis 11/19/2013   Neuritis or radiculitis due to rupture of lumbar intervertebral disc 11/19/2013   Monilial vaginitis 08/20/2013   Gastritis and duodenitis 07/25/2013   Impaired fasting glucose 07/17/2012   Obesity, unspecified 07/17/2012   Obesity 07/17/2012   Anxiety and depression 03/15/2012   Abnormal electrocardiogram (ECG) (EKG) 03/06/2012   URI (upper respiratory infection) 01/16/2012    Past Surgical History:  Procedure Laterality Date   ABDOMINAL HYSTERECTOMY     total for fibroids no h/o abnormal pap   bariatric sleeve  2015   BREAST EXCISIONAL BIOPSY Left 1998   carpal tunnel repair     COLONOSCOPY WITH PROPOFOL N/A 02/11/2016   Procedure: COLONOSCOPY WITH PROPOFOL;  Surgeon: Jonathon Bellows, MD;  Location: [facility] ENDOSCOPY;  Service: Endoscopy;  Laterality: N/A;   COLONOSCOPY WITH PROPOFOL N/A 10/01/2019   Procedure: COLONOSCOPY WITH PROPOFOL;  Surgeon: Jonathon Bellows, MD;  Location: [facility] ENDOSCOPY;  Service: Gastroenterology;  Laterality: N/A;   ESOPHAGOGASTRODUODENOSCOPY (EGD) WITH PROPOFOL N/A 12/02/2019   Procedure: ESOPHAGOGASTRODUODENOSCOPY (EGD) WITH PROPOFOL;  Surgeon: Jonathon Bellows, MD;  Location[facility] ENDOSCOPY;  Service: Gastroenterology;  Laterality: N/A;   HEMORRHOID SURGERY      Prior to Admission medications   Medication Sig Start Date End Date Taking? Authorizing Provider  acetaminophen (TYLENOL) 325 MG tablet Take 2 tablets (650 mg total) by mouth every 6 (six) hours as needed for mild pain (or Fever >/= 101). 06/12/20   Nolberto Hanlon, MD  acyclovir (ZOVIRAX) 400 MG tablet Take 400 mg by mouth daily. 07/13/20   [provider]  famotidine (PEPCID) 20 MG tablet TAKE 1 TABLET BY MOUTH EVERY NIGHT AT  BEDTIME 09/01/20   [name], FNP  gabapentin (NEURONTIN) 300 MG capsule Take 1 capsule (300 mg total) by mouth 2 (two) times daily. 11/11/20   [name], FNP  hydrocortisone (ANUSOL-HC) 25 MG suppository Place 1 suppository (25 mg total) rectally 2 (two) times daily. 11/11/20   [name], FNP  levothyroxine (SYNTHROID) 50 MCG tablet TAKE 1 TABLET BY MOUTH EVERY DAY 09/23/20   [name], FNP  losartan (COZAAR) 100 MG tablet TAKE 1 TABLET BY MOUTH EVERY DAY AT BEDTIME 10/30/20   [name], FNP  sertraline (ZOLOFT) 100 MG tablet Take 1 tablet (100 mg total) by mouth at bedtime. 11/11/20   [name], FNP  triamterene-hydrochlorothiazide (MAXZIDE-25) 37.5-25 MG tablet Take 1 tablet by mouth daily.    [provider]    Allergies Celecoxib, Hydrocodone, Pollen extract, Nasacort [triamcinolone], and Vicodin [hydrocodone-acetaminophen]  Family History  Problem Relation Age of Onset   Breast cancer Sister 11       materal 1/2 sister   Hypertension Sister    Hypertension Mother    Heart disease Father    Hypertension Brother     Social History Social History   Tobacco Use   Smoking status: Former    Packs/day: 1.50  Years: 24.00    Pack years: 36.00    Types: Cigarettes    Quit date: 44    Years since quitting: 29.8   Smokeless tobacco: Never   Tobacco comments:    quit 1995.   Vaping Use   Vaping Use: Never used  Substance Use Topics   Alcohol use: Not Currently   Drug use: No    Review of Systems  Review of Systems  Constitutional:  Positive for malaise/fatigue. Negative for chills and fever.  HENT:  Negative for sore throat.   Eyes:  Negative for pain.  Respiratory:  Positive for shortness of breath. Negative for cough and stridor.   Cardiovascular:  Negative for chest pain.  Gastrointestinal:  Positive for blood in stool (bright red 2 weeks ago), constipation and melena (on and off over last several weeks).  Negative for vomiting.  Genitourinary:  Negative for dysuria.  Musculoskeletal:  Negative for myalgias.  Skin:  Negative for rash.  Neurological:  Positive for weakness. Negative for seizures, loss of consciousness and headaches.  Psychiatric/Behavioral:  Negative for suicidal ideas.   All other systems reviewed and are negative.    ____________________________________________   PHYSICAL EXAM:  VITAL SIGNS: ED Triage Vitals  Enc Vitals Group     BP 11/17/20 1322 (!) 144/72     Pulse Rate 11/17/20 1322 94     Resp 11/17/20 1322 20     Temp 11/17/20 1322 98.8 F (37.1 C)     Temp Source 11/17/20 1322 Oral     SpO2 11/17/20 1322 100 %     Weight 11/17/20 1327 179 lb 14.3 oz (81.6 kg)     Height 11/17/20 1327 5\' 1"  (1.549 m)     Head Circumference --      Peak Flow --      Pain Score 11/17/20 1327 5     Pain Loc --      Pain Edu? --      Excl. in [place]? --    Vitals:   11/17/20 1322  BP: (!) 144/72  Pulse: 94  Resp: 20  Temp: 98.8 F (37.1 C)  SpO2: 100%   Physical Exam Vitals and nursing note reviewed.  Constitutional:      General: She is not in acute distress.    Appearance: She is well-developed.  HENT:     Head: Normocephalic and atraumatic.     Right Ear: External ear normal.     Left Ear: External ear normal.     Nose: Nose normal.  Eyes:     Conjunctiva/sclera: Conjunctivae normal.  Cardiovascular:     Rate and Rhythm: Normal rate and regular rhythm.     Heart sounds: No murmur heard. Pulmonary:     Effort: Pulmonary effort is normal. No respiratory distress.     Breath sounds: Normal breath sounds.  Abdominal:     Palpations: Abdomen is soft.     Tenderness: There is no abdominal tenderness.  Musculoskeletal:     Cervical back: Neck supple.  Skin:    General: Skin is warm and dry.     Capillary Refill: Capillary refill takes less than 2 seconds.  Neurological:     Mental Status: She is alert and oriented to person, place, and time.  Psychiatric:         Mood and Affect: Mood normal.     ____________________________________________   LABS (all labs ordered are listed, but only abnormal results are displayed)  Labs Reviewed  CBC WITH  DIFFERENTIAL/PLATELET - Abnormal; Notable for the following components:      Result Value   RBC 2.87 (*)    Hemoglobin 7.4 (*)    HCT 23.7 (*)    MCH 25.8 (*)    RDW 18.0 (*)    nRBC 0.7 (*)    All other components within normal limits  BASIC METABOLIC PANEL - Abnormal; Notable for the following components:   Glucose, Bld 118 (*)    All other components within normal limits  RESP PANEL BY RT-PCR (FLU A&B, COVID) ARPGX2  TYPE AND SCREEN  PREPARE RBC (CROSSMATCH)  ABO/RH   ____________________________________________  EKG  ECG remarkable for sinus rhythm with a ventricular rate of 74, normal axis, unremarkable intervals without evidence of acute ischemia or significant arrhythmia.  This ECG was obtained on 10/31. ____________________________________________  RADIOLOGY  ED MD interpretation: Chest  x-ray obtained yesterday shows no focal consolidations, effusion, edema, pneumothorax or other acute thoracic process  Official radiology report(s): No results found.  ____________________________________________   PROCEDURES  Procedure(s) performed (including Critical Care):  Procedures   ____________________________________________   INITIAL IMPRESSION / ASSESSMENT AND PLAN / ED COURSE      Patient presents with above-stated history exam after being told to come to emergency room for further evaluation management of concern for symptomatic anemia from malabsorption and GI loss causing fatigue and shortness of breath.  Patient did come yesterday but left prior to being seen due to long wait times but was told her hemoglobin was quite low and she absolutely need to be seen.  On arrival she is hypertensive with otherwise stable vital signs on room air.  Her abdomen is soft nontender  throughout and she has no CVA tenderness.  Advised her that rectal exam as part of standard evaluation of anemia with any concerns for GI source although patient refuses at this time stating she prefers it to be done by her GI doctor.  I was able to review work-up yesterday initiated in triage including ECG and chest x-ray which shows no evidence of pneumonia, heart failure, arrhythmia, cardiac ischemia or other clear acute thoracic process.  Yesterday patient had a CBC drawn that showed hemoglobin of 6.9 and normal platelets and no leukocytosis.  BMP yesterday was unremarkable.  CBC today shows hemoglobin of 7.4 with normal platelets and no leukocytosis.  BMP is unremarkable without evidence of significant electrolyte or metabolic derangements.  I suspect likely symptomatic anemia and despite patient's hemoglobin being slightly above 7 we will transfuse 1 unit of PRBC.  Discussed concerns for GI bleed contributing with on-call gastroenterologist Dr. [name] who recommended hospitalist admission for plan for more urgent colonoscopy and work-up pending patient is currently planned outpatient colonoscopy in 2 weeks.  I will plan to admit to medicine service for further evaluation and management.  Low suspicion for acute trauma or infectious process at this time.          ____________________________________________   FINAL CLINICAL IMPRESSION(S) / ED DIAGNOSES  Final diagnoses:  Symptomatic anemia  Gastrointestinal hemorrhage, unspecified gastrointestinal hemorrhage type    Medications  0.9 %  sodium chloride infusion (has no administration in time range)  pantoprazole (PROTONIX) injection 40 mg (has no administration in time range)     ED Discharge Orders     None        Note:  This document was prepared using Dragon voice recognition software and may include unintentional dictation errors.    Lucrezia Starch, MD 11/17/20 [phone]

## 2020-11-17 NOTE — Telephone Encounter (Signed)
Naliah Eddington one of the providers from Dr. Synthia Innocent office called back in regards to patient. I let her know that patient did go to ED as advised after talking to Dr. [name] from GI. After giving her all info, Judson Roch agreed that patient did need to be in ED for fastest care.

## 2020-11-17 NOTE — ED Triage Notes (Signed)
Patient to ER for c/o hgb 6.9 (NP called with result). Last level was hgb was 10 at visit 3 weeks ago. Patient was at ER yesterday, but LWBS. Has h/o gastric sleeve surgery and has had bright red rectal bleeding.

## 2020-11-17 NOTE — H&P (Signed)
Jennifer [name] is an 70 y.o. female.   Chief Complaint: Fatigue, shortness of breath, with BRBPR. Severe anemia. HPI: The patient is a 70 yr old woman who has been having intermittent BPBPR in the setting of chronic melena due to oral iron supplementation for the past couple of weeks. Initially she was going to have an outpatient colonoscopy with Dr. [name] on 12/02/2020. However in the last few days she has had progression of shortness of breath and fatigue with exertion. She came to the ED on 11/16/2020 and was found to have a hgb of 6.9, but left before she could be seen. She returned today at the urging of Dr. [name] who now wishes to perform her procedure as in patient.  The patient's medical history is significant for anxiety, s/p gastric sleeve with recent "redo", intestinal malabsorption due to bariatric surgery, chronic iron deficiency anemia, GERD, Herpes genitalis, hyperlipidemia, hypertension, hypothyroidism, and neuropathy.  The patient denies fevers, chills, chest pain, nausea, vomiting, constipation, diarrhea. No abdominal pain, cough, sputum production, or joint pain/swellling. No neurological changes, headache, or rashes.   She has been started on protonix and has been given a transfusion on 1 unit PRBC's. She will be on a clear liquid diet and her hemoglobin and hematocrit will be followed.  Past Medical History:  Diagnosis Date   Anxiety    Bariatric surgery status    Constipation    Dysrhythmia    Elevated liver enzymes    GERD (gastroesophageal reflux disease)    Hemorrhoids    Herpes genitalis    High cholesterol    Hyperlipidemia    Hypertension    Hypothyroidism    Neuropathy    Osteoarthritis    Sleep apnea     Past Surgical History:  Procedure Laterality Date   ABDOMINAL HYSTERECTOMY     total for fibroids no h/o abnormal pap   bariatric sleeve  2015   BREAST EXCISIONAL BIOPSY Left 1998   carpal tunnel repair     COLONOSCOPY WITH PROPOFOL N/A 02/11/2016    Procedure: COLONOSCOPY WITH PROPOFOL;  Surgeon: Jonathon Bellows, MD;  Location: [facility] ENDOSCOPY;  Service: Endoscopy;  Laterality: N/A;   COLONOSCOPY WITH PROPOFOL N/A 10/01/2019   Procedure: COLONOSCOPY WITH PROPOFOL;  Surgeon: Jonathon Bellows, MD;  Location: [facility] ENDOSCOPY;  Service: Gastroenterology;  Laterality: N/A;   ESOPHAGOGASTRODUODENOSCOPY (EGD) WITH PROPOFOL N/A 12/02/2019   Procedure: ESOPHAGOGASTRODUODENOSCOPY (EGD) WITH PROPOFOL;  Surgeon: Jonathon Bellows, MD;  Location: [facility] ENDOSCOPY;  Service: Gastroenterology;  Laterality: N/A;   HEMORRHOID SURGERY      Family History  Problem Relation Age of Onset   Breast cancer Sister 92       materal 1/2 sister   Hypertension Sister    Hypertension Mother    Heart disease Father    Hypertension Brother    Social History:  reports that she quit smoking about 29 years ago. Her smoking use included cigarettes. She has a 36.00 pack-year smoking history. She has never used smokeless tobacco. She reports that she does not currently use alcohol. She reports that she does not use drugs. Medications Prior to Admission  Medication Sig Dispense Refill   acetaminophen (TYLENOL) 325 MG tablet Take 2 tablets (650 mg total) by mouth every 6 (six) hours as needed for mild pain (or Fever >/= 101). (Patient taking differently: Take 2,000 mg by mouth every 6 (six) hours as needed for mild pain (or Fever >/= 101).)     acyclovir (ZOVIRAX) 400 MG tablet Take 400 mg  by mouth daily.     calcium citrate (CALCITRATE - DOSED IN MG ELEMENTAL CALCIUM) 950 (200 Ca) MG tablet Take 200 mg of elemental calcium by mouth daily.     cetirizine (ZYRTEC) 10 MG tablet Take 10 mg by mouth daily.     famotidine (PEPCID) 20 MG tablet TAKE 1 TABLET BY MOUTH EVERY NIGHT AT BEDTIME 90 tablet 1   fluticasone (FLONASE) 50 MCG/ACT nasal spray Place 1 spray into both nostrils daily.     gabapentin (NEURONTIN) 300 MG capsule Take 1 capsule (300 mg total) by mouth 2 (two) times daily. 120 capsule 2    hydrocortisone (ANUSOL-HC) 25 MG suppository Place 1 suppository (25 mg total) rectally 2 (two) times daily. 12 suppository 1   hydrocortisone cream 1 % Apply 1 application topically 2 (two) times daily.     levothyroxine (SYNTHROID) 50 MCG tablet TAKE 1 TABLET BY MOUTH EVERY DAY 90 tablet 1   losartan (COZAAR) 100 MG tablet TAKE 1 TABLET BY MOUTH EVERY DAY AT BEDTIME 90 tablet 0   Multiple Vitamin (MULTIVITAMIN WITH MINERALS) TABS tablet Take 1 tablet by mouth daily.     sertraline (ZOLOFT) 100 MG tablet Take 1 tablet (100 mg total) by mouth at bedtime. 90 tablet 1   triamterene-hydrochlorothiazide (MAXZIDE-25) 37.5-25 MG tablet Take 1 tablet by mouth daily.      Allergies:  Allergies  Allergen Reactions   Celecoxib Hives   Hydrocodone Nausea Only   Pollen Extract    Nasacort [Triamcinolone] Other (See Comments)    Nasal - Nose Bleeds   Vicodin [Hydrocodone-Acetaminophen] Nausea Only    Review of Systems - 12 systems were reviewed with the patient and all were negative except for those elements included in the HPI above.  General appearance: alert, cooperative, fatigued, no distress, and pale Head: Normocephalic, without obvious abnormality, atraumatic Eyes: conjunctivae/corneas clear. PERRL, EOM's intact. Fundi benign. Throat: lips, mucosa, and tongue normal; teeth and gums normal Neck: no adenopathy, no carotid bruit, no JVD, supple, symmetrical, trachea midline, and thyroid not enlarged, symmetric, no tenderness/mass/nodules Resp:  No increased work of breathing. No wheezes, rales, or rhonchi. No tactile fremitus. Chest wall: no tenderness Cardio: regular rate and rhythm, S1, S2 normal, no murmur, click, rub or gallop GI: soft, non-tender; bowel sounds normal; no masses,  no organomegaly Extremities: extremities normal, atraumatic, no cyanosis or edema Pulses: 2+ and symmetric Skin: Skin color, texture, turgor normal. No rashes or lesions Lymph nodes: Cervical,  supraclavicular, and axillary nodes normal. Neurologic: Alert and oriented X 3, normal strength and tone. Normal symmetric reflexes. Normal coordination and gait Incision/Wound: None noted.  Results for orders placed or performed during the hospital encounter of 11/17/20 (from the past 48 hour(s))  CBC with Differential     Status: Abnormal   Collection Time: 11/17/20  1:30 PM  Result Value Ref Range   WBC 5.8 4.0 - 10.5 K/uL   RBC 2.87 (L) 3.87 - 5.11 MIL/uL   Hemoglobin 7.4 (L) 12.0 - 15.0 g/dL   HCT 23.7 (L) 36.0 - 46.0 %   MCV 82.6 80.0 - 100.0 fL   MCH 25.8 (L) 26.0 - 34.0 pg   MCHC 31.2 30.0 - 36.0 g/dL   RDW 18.0 (H) 11.5 - 15.5 %   Platelets 349 150 - 400 K/uL   nRBC 0.7 (H) 0.0 - 0.2 %   Neutrophils Relative % 46 %   Neutro Abs 2.7 1.7 - 7.7 K/uL   Lymphocytes Relative 41 %  Lymphs Abs 2.4 0.7 - 4.0 K/uL   Monocytes Relative 11 %   Monocytes Absolute 0.6 0.1 - 1.0 K/uL   Eosinophils Relative 1 %   Eosinophils Absolute 0.1 0.0 - 0.5 K/uL   Basophils Relative 1 %   Basophils Absolute 0.1 0.0 - 0.1 K/uL   Immature Granulocytes 0 %   Abs Immature Granulocytes 0.01 0.00 - 0.07 K/uL    Comment: Performed at [facility], [address], [place], [place] [address]  Basic metabolic panel     Status: Abnormal   Collection Time: 11/17/20  1:30 PM  Result Value Ref Range   Sodium 138 135 - 145 mmol/L   Potassium 3.7 3.5 - 5.1 mmol/L   Chloride 101 98 - 111 mmol/L   CO2 26 22 - 32 mmol/L   Glucose, Bld 118 (H) 70 - 99 mg/dL    Comment: Glucose reference range applies only to samples taken after fasting for at least 8 hours.   BUN 10 8 - 23 mg/dL   Creatinine, Ser 0.68 0.44 - 1.00 mg/dL   Calcium 9.1 8.9 - 10.3 mg/dL   GFR, Estimated >60 >60 mL/min    Comment: (NOTE) Calculated using the CKD-EPI Creatinine Equation (2021)    Anion gap 11 5 - 15    Comment: Performed at [facility], [address] [place], [place] [address]  Type and screen [place]     Status: None   Collection Time: 11/17/20  1:30 PM  Result Value Ref Range   ABO/RH(D) O POS    Antibody Screen NEG    Sample Expiration      11/16/2020,2359 Performed at [facility], [address], [place], [place] [address]   Prepare RBC (crossmatch)     Status: None   Collection Time: 11/17/20  4:41 PM  Result Value Ref Range   Order Confirmation      ORDER PROCESSED BY BLOOD BANK Performed at [facility], [address] [place], [place] [address]   Type and screen     Status: None (Preliminary result)   Collection Time: 11/17/20  5:00 PM  Result Value Ref Range   ABO/RH(D) O POS    Antibody Screen NEG    Sample Expiration 11/20/2020,2359    Unit Number [id number]    Blood Component Type RED CELLS,LR    Unit division 00    Status of Unit ISSUED    Transfusion Status OK TO TRANSFUSE    Crossmatch Result      Compatible Performed at [facility], [address] [place], [place] [address]   Resp Panel by RT-PCR (Flu A&B, Covid) Nasopharyngeal Swab     Status: None   Collection Time: 11/17/20  5:01 PM   Specimen: Nasopharyngeal Swab; Nasopharyngeal(NP) swabs in vial transport medium  Result Value Ref Range   SARS Coronavirus 2 by RT PCR NEGATIVE NEGATIVE    Comment: (NOTE) SARS-CoV-2 target nucleic acids are NOT DETECTED.  The SARS-CoV-2 RNA is generally detectable in upper respiratory specimens during the acute phase of infection. The lowest concentration of SARS-CoV-2 viral copies this assay can detect is 138 copies/mL. A negative result does not preclude SARS-Cov-2 infection and should not be used as the sole basis for treatment or other patient management decisions. A negative result may occur with  improper specimen collection/handling, submission of specimen other than nasopharyngeal swab, presence of viral mutation(s) within the areas targeted by this assay, and inadequate number of  viral copies(<138 copies/mL). A negative result must be combined with clinical observations, patient history, and epidemiological information. The expected result is Negative.  Fact Sheet for Patients:  EntrepreneurPulse.com.au  Fact Sheet for Healthcare Providers:  IncredibleEmployment.be  This test is no t yet approved or cleared by the [place] FDA and  has been authorized for detection and/or diagnosis of SARS-CoV-2 by FDA under an Emergency Use Authorization (EUA). This EUA will remain  in effect (meaning this test can be used) for the duration of the COVID-19 declaration under Section 564(b)(1) of the Act, 21 U.S.C.section 360bbb-3(b)(1), unless the authorization is terminated  or revoked sooner.       Influenza A by PCR NEGATIVE NEGATIVE   Influenza B by PCR NEGATIVE NEGATIVE    Comment: (NOTE) The Xpert Xpress SARS-CoV-2/FLU/RSV plus assay is intended as an aid in the diagnosis of influenza from Nasopharyngeal swab specimens and should not be used as a sole basis for treatment. Nasal washings and aspirates are unacceptable for Xpert Xpress SARS-CoV-2/FLU/RSV testing.  Fact Sheet for Patients: EntrepreneurPulse.com.au  Fact Sheet for Healthcare Providers: IncredibleEmployment.be  This test is not yet approved or cleared by the [place] FDA and has been authorized for detection and/or diagnosis of SARS-CoV-2 by FDA under an Emergency Use Authorization (EUA). This EUA will remain in effect (meaning this test can be used) for the duration of the COVID-19 declaration under Section 564(b)(1) of the Act, 21 U.S.C. section 360bbb-3(b)(1), unless the authorization is terminated or revoked.  Performed at [facility], [address] [place], [place] [address]   Hemoglobin and hematocrit, blood     Status: Abnormal   Collection Time: 11/17/20  6:42 PM  Result Value Ref Range    Hemoglobin 6.8 (L) 12.0 - 15.0 g/dL   HCT 22.9 (L) 36.0 - 46.0 %    Comment: Performed at [facility], [address] [place], [place] [address]   @[email or web address] @  Blood pressure (!) 141/68, pulse 69, temperature 99.5 F (37.5 C), temperature source Oral, resp. rate 18, height 5\' 1"  (1.549 m), weight 81.6 kg, SpO2 100 %.    Assessment/Plan Symptomatic anemia The patient presented to ED at the instruction of her gastroenterologist [name] after she reported BRBPR since 11/11/2020. She has been short of breath and fatigued especially with exertion. She initially presented yesterday and was found to have a hemoglobin of 6.9. She left before she could be seen and returned today at the instruction of Dr. [name]. Her hemoglobin today is 7.4. Dr. [name] had planned to perform colonoscopy on the patient as outpatient on 12/02/2020, but given the acuity of the patient's symptoms he decided that she should be admitted to have colonoscopy as inpatient. The patient was transfused with one unit PRBC's in the ED. Her H&h will be followed as inpatient.   Chronic iron deficiency anemia Due to intestinal malabsorption related to the patient's gastric sleeve procedure and recent redo of this procedure. She is on oral iron supplements as outpatient.  GI bleed The patient has been experiencing BRBPR intermittently over the past couple of weeks in the setting of chronic melena due to oral iron supplementation. Dr. [name] plans to do colonoscopy as inpatient. She has been placed on a clear liquid diet.  Essential hypertension BP has been a little high today in the ED. She will be continued on losartan 100 mg daily as at home. Monitor.  Hypothyroidism Continue levothyroxine as at home.   I have seen and examined this patient  myself. I have spent 74 minutes in her evaluation and admission.   Thelma Lorenzetti 11/17/2020, 10:33 PM

## 2020-11-17 NOTE — Assessment & Plan Note (Addendum)
The patient presented to ED at the instruction of her gastroenterologist [name] after she reported BRBPR since 11/11/2020. She has been short of breath and fatigued especially with exertion. She initially presented yesterday and was found to have a hemoglobin of 6.9. She left before she could be seen and returned today at the instruction of Dr. [name]. Her hemoglobin today is 7.4. Dr. [name] had planned to perform colonoscopy on the patient as outpatient on 12/02/2020, but given the acuity of the patient's symptoms he decided that she should be admitted to have colonoscopy as inpatient. The patient was transfused with one unit PRBC's in the ED. Her H&h will be followed as inpatient.

## 2020-11-17 NOTE — Telephone Encounter (Signed)
I spoke with Dr. Synthia Innocent office & a high priority message with all info has been sent to his team. Both office & direct number was given for someone to call back ASAP.

## 2020-11-17 NOTE — Assessment & Plan Note (Addendum)
The patient has been experiencing BRBPR intermittently over the past couple of weeks in the setting of chronic melena due to oral iron supplementation. Dr. [name] plans to do colonoscopy as inpatient. She has been placed on a clear liquid diet.

## 2020-11-17 NOTE — ED Notes (Signed)
Pt presents to ED after being sent by PCP due to dark red and bright red blood loss from the rectum. Pt denies taking a blood thinner but does report SOB on exertion. Pt denies this ever happening. Pt denies pain at this time. Pt states she needs a colonoscopy per PCP. Pt is A&Ox4 at this time. VSS. NAD noted.

## 2020-11-17 NOTE — ED Provider Notes (Signed)
Emergency Medicine Provider Triage Evaluation Note  Jennifer [name] , a 70 y.o. female  was evaluated in triage.  Pt complains of shortness of breath on exertion. History of anemia. Here yesterday for the same with Hgb of 6.9/Hct22.7. PCP sent back to ER for possible transfusion and admission.  Review of Systems  Positive: Shortness of breath Negative: Chest pain  Physical Exam  BP (!) 144/72 (BP Location: Left Arm)   Pulse 94   Temp 98.8 F (37.1 C) (Oral)   Resp 20   SpO2 100%  Gen:   Awake, no distress   Resp:  Normal effort  MSK:   Moves extremities without difficulty  Other:    Medical Decision Making  Medically screening exam initiated at 1:27 PM.  Appropriate orders placed.  Jennifer [name] was informed that the remainder of the evaluation will be completed by another provider, this initial triage assessment does not replace that evaluation, and the importance of remaining in the ED until their evaluation is complete.   Victorino Dike, FNP 11/17/20 1330    Lavonia Drafts, MD 11/17/20 1351

## 2020-11-17 NOTE — ED Notes (Signed)
ED TO INPATIENT HANDOFF REPORT  ED Nurse Name and Phone #: [phone]  S Name/Age/Gender Jennifer [name] 70 y.o. female Room/Bed: ED34A/ED34A  Code Status   Code Status: Full Code  Home/SNF/Other Home Patient oriented to: self, place, time, and situation Is this baseline? Yes   Triage Complete: Triage complete  Chief Complaint GI bleed [K92.2]  Triage Note Patient to ER for c/o hgb 6.9 (NP called with result). Last level was hgb was 10 at visit 3 weeks ago. Patient was at ER yesterday, but LWBS. Has h/o gastric sleeve surgery and has had bright red rectal bleeding.   Pt states that she had bright rectal bleeding 2 weeks ago without eval, pt states dr Wilhemena Durie scheduled her for a colonoscopy for nov 16th, pt states that she came to the er yesterday due to exertional sob, but left after waiting so long, pt spoke with her pcp who told her to come back to the ER for possible admit and blood transfusion due to low hgb of 6.9   Allergies Allergies  Allergen Reactions   Celecoxib Hives   Hydrocodone Nausea Only   Pollen Extract    Nasacort [Triamcinolone] Other (See Comments)    Nasal - Nose Bleeds   Vicodin [Hydrocodone-Acetaminophen] Nausea Only    Level of Care/Admitting Diagnosis ED Disposition     ED Disposition  Admit   Condition  --   Comment  Hospital Area: [facility] [[id number]]  Level of Care: Med-Surg [[id number]]  Covid Evaluation: Asymptomatic Screening Protocol (No Symptoms)  Diagnosis: GI bleed [474259]  Admitting Physician: Karie Kirks [[id number]]  Attending Physician: Benny Lennert, AVA [email or web address]  Estimated length of stay: past midnight tomorrow  Certification:: I certify this patient will need inpatient services for at least 2 midnights          B Medical/Surgery History Past Medical History:  Diagnosis Date   Anxiety    Bariatric surgery status    Constipation    Dysrhythmia    Elevated liver enzymes    GERD (gastroesophageal reflux disease)     Hemorrhoids    Herpes genitalis    High cholesterol    Hyperlipidemia    Hypertension    Hypothyroidism    Neuropathy    Osteoarthritis    Sleep apnea    Past Surgical History:  Procedure Laterality Date   ABDOMINAL HYSTERECTOMY     total for fibroids no h/o abnormal pap   bariatric sleeve  2015   BREAST EXCISIONAL BIOPSY Left 1998   carpal tunnel repair     COLONOSCOPY WITH PROPOFOL N/A 02/11/2016   Procedure: COLONOSCOPY WITH PROPOFOL;  Surgeon: Jonathon Bellows, MD;  Location: [facility] ENDOSCOPY;  Service: Endoscopy;  Laterality: N/A;   COLONOSCOPY WITH PROPOFOL N/A 10/01/2019   Procedure: COLONOSCOPY WITH PROPOFOL;  Surgeon: Jonathon Bellows, MD;  Location: [facility] ENDOSCOPY;  Service: Gastroenterology;  Laterality: N/A;   ESOPHAGOGASTRODUODENOSCOPY (EGD) WITH PROPOFOL N/A 12/02/2019   Procedure: ESOPHAGOGASTRODUODENOSCOPY (EGD) WITH PROPOFOL;  Surgeon: Jonathon Bellows, MD;  Location: [facility] ENDOSCOPY;  Service: Gastroenterology;  Laterality: N/A;   HEMORRHOID SURGERY       A IV Location/Drains/Wounds Patient Lines/Drains/Airways Status     Active Line/Drains/Airways     Name Placement date Placement time Site Days   Peripheral IV 11/17/20 20 G Left Antecubital 11/17/20  1731  Antecubital  less than 1   Wound / Incision (Open or Dehisced) 75/64/33 Umbilicus 29/51/88  --  Umbilicus  416  Intake/Output Last 24 hours No intake or output data in the 24 hours ending 11/17/20 2003  Labs/Imaging Results for orders placed or performed during the hospital encounter of 11/17/20 (from the past 48 hour(s))  CBC with Differential     Status: Abnormal   Collection Time: 11/17/20  1:30 PM  Result Value Ref Range   WBC 5.8 4.0 - 10.5 K/uL   RBC 2.87 (L) 3.87 - 5.11 MIL/uL   Hemoglobin 7.4 (L) 12.0 - 15.0 g/dL   HCT 23.7 (L) 36.0 - 46.0 %   MCV 82.6 80.0 - 100.0 fL   MCH 25.8 (L) 26.0 - 34.0 pg   MCHC 31.2 30.0 - 36.0 g/dL   RDW 18.0 (H) 11.5 - 15.5 %   Platelets 349 150 - 400 K/uL    nRBC 0.7 (H) 0.0 - 0.2 %   Neutrophils Relative % 46 %   Neutro Abs 2.7 1.7 - 7.7 K/uL   Lymphocytes Relative 41 %   Lymphs Abs 2.4 0.7 - 4.0 K/uL   Monocytes Relative 11 %   Monocytes Absolute 0.6 0.1 - 1.0 K/uL   Eosinophils Relative 1 %   Eosinophils Absolute 0.1 0.0 - 0.5 K/uL   Basophils Relative 1 %   Basophils Absolute 0.1 0.0 - 0.1 K/uL   Immature Granulocytes 0 %   Abs Immature Granulocytes 0.01 0.00 - 0.07 K/uL    Comment: Performed at [facility], [address] [place], [place] [address]  Basic metabolic panel     Status: Abnormal   Collection Time: 11/17/20  1:30 PM  Result Value Ref Range   Sodium 138 135 - 145 mmol/L   Potassium 3.7 3.5 - 5.1 mmol/L   Chloride 101 98 - 111 mmol/L   CO2 26 22 - 32 mmol/L   Glucose, Bld 118 (H) 70 - 99 mg/dL    Comment: Glucose reference range applies only to samples taken after fasting for at least 8 hours.   BUN 10 8 - 23 mg/dL   Creatinine, Ser 0.68 0.44 - 1.00 mg/dL   Calcium 9.1 8.9 - 10.3 mg/dL   GFR, Estimated >60 >60 mL/min    Comment: (NOTE) Calculated using the CKD-EPI Creatinine Equation (2021)    Anion gap 11 5 - 15    Comment: Performed at [facility], [address] [place], [place] [address]  Type and screen [place]     Status: None   Collection Time: 11/17/20  1:30 PM  Result Value Ref Range   ABO/RH(D) O POS    Antibody Screen NEG    Sample Expiration      11/16/2020,2359 Performed at [facility], [address] [place], [place] [address]   Prepare RBC (crossmatch)     Status: None   Collection Time: 11/17/20  4:41 PM  Result Value Ref Range   Order Confirmation      ORDER PROCESSED BY BLOOD BANK Performed at [facility], [address] [place], [place] [address]   Type and screen     Status: None (Preliminary result)   Collection Time: 11/17/20  5:00 PM  Result Value Ref Range   ABO/RH(D) O POS    Antibody Screen NEG    Sample  Expiration      11/20/2020,2359 Performed at [facility], [address] [place], [place] [address]    Unit Number [id number]    Blood Component Type RED CELLS,LR    Unit division 00  Status of Unit ALLOCATED    Transfusion Status OK TO TRANSFUSE    Crossmatch Result Compatible   Resp Panel by RT-PCR (Flu A&B, Covid) Nasopharyngeal Swab     Status: None   Collection Time: 11/17/20  5:01 PM   Specimen: Nasopharyngeal Swab; Nasopharyngeal(NP) swabs in vial transport medium  Result Value Ref Range   SARS Coronavirus 2 by RT PCR NEGATIVE NEGATIVE    Comment: (NOTE) SARS-CoV-2 target nucleic acids are NOT DETECTED.  The SARS-CoV-2 RNA is generally detectable in upper respiratory specimens during the acute phase of infection. The lowest concentration of SARS-CoV-2 viral copies this assay can detect is 138 copies/mL. A negative result does not preclude SARS-Cov-2 infection and should not be used as the sole basis for treatment or other patient management decisions. A negative result may occur with  improper specimen collection/handling, submission of specimen other than nasopharyngeal swab, presence of viral mutation(s) within the areas targeted by this assay, and inadequate number of viral copies(<138 copies/mL). A negative result must be combined with clinical observations, patient history, and epidemiological information. The expected result is Negative.  Fact Sheet for Patients:  EntrepreneurPulse.com.au  Fact Sheet for Healthcare Providers:  IncredibleEmployment.be  This test is no t yet approved or cleared by the [place] FDA and  has been authorized for detection and/or diagnosis of SARS-CoV-2 by FDA under an Emergency Use Authorization (EUA). This EUA will remain  in effect (meaning this test can be used) for the duration of the COVID-19 declaration under Section 564(b)(1) of the Act, 21 U.S.C.section 360bbb-3(b)(1),  unless the authorization is terminated  or revoked sooner.       Influenza A by PCR NEGATIVE NEGATIVE   Influenza B by PCR NEGATIVE NEGATIVE    Comment: (NOTE) The Xpert Xpress SARS-CoV-2/FLU/RSV plus assay is intended as an aid in the diagnosis of influenza from Nasopharyngeal swab specimens and should not be used as a sole basis for treatment. Nasal washings and aspirates are unacceptable for Xpert Xpress SARS-CoV-2/FLU/RSV testing.  Fact Sheet for Patients: EntrepreneurPulse.com.au  Fact Sheet for Healthcare Providers: IncredibleEmployment.be  This test is not yet approved or cleared by the [place] FDA and has been authorized for detection and/or diagnosis of SARS-CoV-2 by FDA under an Emergency Use Authorization (EUA). This EUA will remain in effect (meaning this test can be used) for the duration of the COVID-19 declaration under Section 564(b)(1) of the Act, 21 U.S.C. section 360bbb-3(b)(1), unless the authorization is terminated or revoked.  Performed at [facility], [address] [place], [place] [address]   Hemoglobin and hematocrit, blood     Status: Abnormal   Collection Time: 11/17/20  6:42 PM  Result Value Ref Range   Hemoglobin 6.8 (L) 12.0 - 15.0 g/dL   HCT 22.9 (L) 36.0 - 46.0 %    Comment: Performed at [facility], [address] [place], [place] [address]   DG Chest 2 View  Result Date: 11/16/2020 CLINICAL DATA:  Shortness of breath. EXAM: CHEST - 2 VIEW COMPARISON:  06/09/2020 FINDINGS: The heart size and mediastinal contours are within normal limits. Both lungs are clear. The visualized skeletal structures are unremarkable. IMPRESSION: No active cardiopulmonary disease. Electronically Signed   By: Markus Daft M.D.   On: 11/16/2020 10:50    Pending Labs Unresulted Labs (From admission, onward)     Start     Ordered   11/18/20 0500  Comprehensive metabolic panel  Tomorrow morning,   STAT  11/17/20 1758   11/18/20 0500  CBC  Tomorrow morning,   STAT        11/17/20 1758   Signed and Held  SARS Coronavirus 2 by RT PCR (hospital order, performed in [place] hospital lab) Nasopharyngeal Nasopharyngeal Swab  (COVID (Single) Lab - Urgent Cases (Scheduled in the next 24 hours only))  Once,   R       Question Answer Comment  Previously tested for COVID-19 Yes   Resident in a congregate (group) care setting No   Employed in healthcare setting No   Pregnant No   Has patient completed COVID vaccination(s) (2 doses of Pfizer/Moderna 1 dose of [employer]) Unknown      Signed and Held            Vitals/Pain Today's Vitals   11/17/20 1322 11/17/20 1327 11/17/20 1718 11/17/20 1737  BP: (!) 144/72  138/68   Pulse: 94  60   Resp: 20  20   Temp: 98.8 F (37.1 C)     TempSrc: Oral     SpO2: 100%  100%   Weight:  81.6 kg    Height:  5\' 1"  (1.549 m)    PainSc:  5   5     Isolation Precautions No active isolations  Medications Medications  0.9 %  sodium chloride infusion (has no administration in time range)  acetaminophen (TYLENOL) tablet 1,000 mg (has no administration in time range)  acyclovir (ZOVIRAX) 200 MG capsule 400 mg (has no administration in time range)  losartan (COZAAR) tablet 100 mg (has no administration in time range)  sertraline (ZOLOFT) tablet 100 mg (has no administration in time range)  levothyroxine (SYNTHROID) tablet 50 mcg (has no administration in time range)  gabapentin (NEURONTIN) capsule 300 mg (has no administration in time range)  hydrocortisone (ANUSOL-HC) suppository 25 mg (has no administration in time range)  acetaminophen (TYLENOL) tablet 650 mg (has no administration in time range)    Or  acetaminophen (TYLENOL) suppository 650 mg (has no administration in time range)  pantoprazole (PROTONIX) 80 mg /NS 100 mL IVPB (has no administration in time range)  pantoprozole (PROTONIX) 80 mg /NS 100 mL infusion (has no  administration in time range)  pantoprazole (PROTONIX) injection 40 mg (has no administration in time range)  pantoprazole (PROTONIX) injection 40 mg (40 mg Intravenous Given 11/17/20 1732)    Mobility walks Low fall risk   Focused Assessments     R Recommendations: See Admitting Provider Note  Report given to:   Additional Notes: ambulatory to [facility] / denies dizziness

## 2020-11-17 NOTE — Assessment & Plan Note (Signed)
Continue levothyroxine as at home.

## 2020-11-17 NOTE — ED Notes (Signed)
Paper Consent signed by pt. Filed in pt's chart.

## 2020-11-17 NOTE — ED Triage Notes (Signed)
Pt states that she had bright rectal bleeding 2 weeks ago without eval, pt states dr Jennifer [name] scheduled her for a colonoscopy for nov 16th, pt states that she came to the er yesterday due to exertional sob, but left after waiting so long, pt spoke with her pcp who told her to come back to the ER for possible admit and blood transfusion due to low hgb of 6.9

## 2020-11-18 ENCOUNTER — Encounter: Payer: Self-pay | Admitting: Internal Medicine

## 2020-11-18 DIAGNOSIS — D509 Iron deficiency anemia, unspecified: Secondary | ICD-10-CM

## 2020-11-18 DIAGNOSIS — D649 Anemia, unspecified: Secondary | ICD-10-CM | POA: Diagnosis not present

## 2020-11-18 DIAGNOSIS — K922 Gastrointestinal hemorrhage, unspecified: Secondary | ICD-10-CM

## 2020-11-18 LAB — TYPE AND SCREEN
ABO/RH(D): O POS
Antibody Screen: NEGATIVE
Unit division: 0

## 2020-11-18 LAB — CBC
HCT: 26.3 % — ABNORMAL LOW (ref 36.0–46.0)
Hemoglobin: 8.3 g/dL — ABNORMAL LOW (ref 12.0–15.0)
MCH: 26.2 pg (ref 26.0–34.0)
MCHC: 31.6 g/dL (ref 30.0–36.0)
MCV: 83 fL (ref 80.0–100.0)
Platelets: 317 K/uL (ref 150–400)
RBC: 3.17 MIL/uL — ABNORMAL LOW (ref 3.87–5.11)
RDW: 17.1 % — ABNORMAL HIGH (ref 11.5–15.5)
WBC: 6.9 K/uL (ref 4.0–10.5)
nRBC: 0.3 % — ABNORMAL HIGH (ref 0.0–0.2)

## 2020-11-18 LAB — COMPREHENSIVE METABOLIC PANEL WITH GFR
ALT: 17 U/L (ref 0–44)
AST: 28 U/L (ref 15–41)
Albumin: 3.7 g/dL (ref 3.5–5.0)
Alkaline Phosphatase: 68 U/L (ref 38–126)
Anion gap: 6 (ref 5–15)
BUN: 9 mg/dL (ref 8–23)
CO2: 30 mmol/L (ref 22–32)
Calcium: 8.8 mg/dL — ABNORMAL LOW (ref 8.9–10.3)
Chloride: 104 mmol/L (ref 98–111)
Creatinine, Ser: 0.72 mg/dL (ref 0.44–1.00)
GFR, Estimated: >60 mL/min
Glucose, Bld: 117 mg/dL — ABNORMAL HIGH (ref 70–99)
Potassium: 3.3 mmol/L — ABNORMAL LOW (ref 3.5–5.1)
Sodium: 140 mmol/L (ref 135–145)
Total Bilirubin: 0.9 mg/dL (ref 0.3–1.2)
Total Protein: 6.3 g/dL — ABNORMAL LOW (ref 6.5–8.1)

## 2020-11-18 LAB — IRON AND TIBC
Iron: 22 ug/dL — ABNORMAL LOW (ref 28–170)
Saturation Ratios: 4 % — ABNORMAL LOW (ref 10.4–31.8)
TIBC: 540 ug/dL — ABNORMAL HIGH (ref 250–450)
UIBC: 518 ug/dL

## 2020-11-18 LAB — BPAM RBC
Blood Product Expiration Date: 202212032359
ISSUE DATE / TIME: 202211012057
Unit Type and Rh: 5100

## 2020-11-18 LAB — FOLATE: Folate: 14.5 ng/mL (ref >5.9)

## 2020-11-18 LAB — HEMOGLOBIN AND HEMATOCRIT, BLOOD
HCT: 28.8 % — ABNORMAL LOW (ref 36.0–46.0)
Hemoglobin: 8.8 g/dL — ABNORMAL LOW (ref 12.0–15.0)

## 2020-11-18 LAB — FERRITIN: Ferritin: 6 ng/mL — ABNORMAL LOW (ref 11–307)

## 2020-11-18 LAB — PREPARE RBC (CROSSMATCH)

## 2020-11-18 LAB — VITAMIN B12: Vitamin B-12: 1319 pg/mL — ABNORMAL HIGH (ref 180–914)

## 2020-11-18 MED ORDER — SODIUM CHLORIDE 0.9 % IV SOLN
INTRAVENOUS | Status: DC
  Administered 2020-11-19: 1000 mL via INTRAVENOUS

## 2020-11-18 MED ORDER — PEG 3350-KCL-NA BICARB-NACL 420 G PO SOLR
4000.0000 mL | Freq: Once | ORAL | Status: AC
  Administered 2020-11-18: 4000 mL via ORAL
  Filled 2020-11-18 (×2): qty 4000

## 2020-11-18 MED ORDER — POTASSIUM CHLORIDE CRYS ER 20 MEQ PO TBCR
40.0000 meq | EXTENDED_RELEASE_TABLET | ORAL | Status: AC
  Administered 2020-11-18 (×2): 40 meq via ORAL
  Filled 2020-11-18 (×2): qty 2

## 2020-11-18 NOTE — Consult Note (Signed)
Jonathon Bellows , MD [address], [place], [place], [place], [address] [address], [place], [place], [place], [address] Phone: [phone]  Fax: [phone]  Consultation  Referring Provider:     ER Primary Care Physician:  [name], FNP Primary Gastroenterologist:  Dr. [name]         Reason for Consultation:     GI bleed  Date of Admission:  11/17/2020 Date of Consultation:  11/18/2020         HPI:   Jennifer [name] is a 70 y.o. female with a history of a duodenal switch surgery who has last been seen by myself in the office in November 2021 for hemorrhoids.10/01/2019: Colonoscopy: Prior history of adenomatous polyps.  A 4 mm polyp noted in the ascending colon.  Resected.  On retroflexion internal hemorrhoids seen on pictures.  The polyp was a tubular adenoma. At that point of time she was having rectal bleeding after every bowel movement.  Upper endoscopy November 2021 showed evidence of widely patent surgical anastomosis in the first part of the duodenum from prior surgery.   06/05/2020 EUS and ERCP at [facility] for bile leak cyst duodenostomy was performed for biloma.Stent was removed in July 2022.   Her primary care provider contacted me yesterday that she was anemic with a hemoglobin of 6.9 and was having black-colored stools.  Was referred to be seen at the ER.She says she has had a few black colored stools over the past week fews, last one was atleast a week back. Last bowel movement earlier today was brown. Denies any hematemesis, no NSAID use, mild abdominal cramp like pain.   Yesterday hemoglobin was 7.4 g with MCV of 82, BMP showed no elevation in BUN/creatinine ratio.  Transfused and this morning Hemoglobin was 8.3 g. Past Medical History:  Diagnosis Date   Anxiety    Bariatric surgery status    Constipation    Dysrhythmia    Elevated liver enzymes    GERD (gastroesophageal reflux disease)    Hemorrhoids    Herpes genitalis    High cholesterol     Hyperlipidemia    Hypertension    Hypothyroidism    Neuropathy    Osteoarthritis    Sleep apnea     Past Surgical History:  Procedure Laterality Date   ABDOMINAL HYSTERECTOMY     total for fibroids no h/o abnormal pap   bariatric sleeve  2015   BREAST EXCISIONAL BIOPSY Left 1998   carpal tunnel repair     COLONOSCOPY WITH PROPOFOL N/A 02/11/2016   Procedure: COLONOSCOPY WITH PROPOFOL;  Surgeon: Jonathon Bellows, MD;  Location: [facility] ENDOSCOPY;  Service: Endoscopy;  Laterality: N/A;   COLONOSCOPY WITH PROPOFOL N/A 10/01/2019   Procedure: COLONOSCOPY WITH PROPOFOL;  Surgeon: Jonathon Bellows, MD;  Location: [facility] ENDOSCOPY;  Service: Gastroenterology;  Laterality: N/A;   ESOPHAGOGASTRODUODENOSCOPY (EGD) WITH PROPOFOL N/A 12/02/2019   Procedure: ESOPHAGOGASTRODUODENOSCOPY (EGD) WITH PROPOFOL;  Surgeon: Jonathon Bellows, MD;  Location: [facility] ENDOSCOPY;  Service: Gastroenterology;  Laterality: N/A;   HEMORRHOID SURGERY      Prior to Admission medications   Medication Sig Start Date End Date Taking? Authorizing Provider  acetaminophen (TYLENOL) 325 MG tablet Take 2 tablets (650 mg total) by mouth every 6 (six) hours as needed for mild pain (or Fever >/= 101). Patient taking differently: Take 2,000 mg by mouth every 6 (six) hours as needed for mild pain (or Fever >/= 101). 06/12/20  Yes Nolberto Hanlon, MD  acyclovir (ZOVIRAX) 400 MG  tablet Take 400 mg by mouth daily. 07/13/20  Yes [provider]  calcium citrate (CALCITRATE - DOSED IN MG ELEMENTAL CALCIUM) 950 (200 Ca) MG tablet Take 200 mg of elemental calcium by mouth daily.   Yes [provider]  cetirizine (ZYRTEC) 10 MG tablet Take 10 mg by mouth daily.   Yes [provider]  famotidine (PEPCID) 20 MG tablet TAKE 1 TABLET BY MOUTH EVERY NIGHT AT BEDTIME 09/01/20  Yes Arnett, Yvetta Coder, FNP  fluticasone (FLONASE) 50 MCG/ACT nasal spray Place 1 spray into both nostrils daily.   Yes [provider]  gabapentin (NEURONTIN) 300  MG capsule Take 1 capsule (300 mg total) by mouth 2 (two) times daily. 11/11/20  Yes Arnett, Yvetta Coder, FNP  hydrocortisone (ANUSOL-HC) 25 MG suppository Place 1 suppository (25 mg total) rectally 2 (two) times daily. 11/11/20  Yes Arnett, Yvetta Coder, FNP  hydrocortisone cream 1 % Apply 1 application topically 2 (two) times daily.   Yes [provider]  levothyroxine (SYNTHROID) 50 MCG tablet TAKE 1 TABLET BY MOUTH EVERY DAY 09/23/20  Yes [name], FNP  losartan (COZAAR) 100 MG tablet TAKE 1 TABLET BY MOUTH EVERY DAY AT BEDTIME 10/30/20  Yes Arnett, Yvetta Coder, FNP  Multiple Vitamin (MULTIVITAMIN WITH MINERALS) TABS tablet Take 1 tablet by mouth daily.   Yes [provider]  sertraline (ZOLOFT) 100 MG tablet Take 1 tablet (100 mg total) by mouth at bedtime. 11/11/20  Yes [name], FNP  triamterene-hydrochlorothiazide (MAXZIDE-25) 37.5-25 MG tablet Take 1 tablet by mouth daily.   Yes [provider]    Family History  Problem Relation Age of Onset   Breast cancer Sister 63       materal 1/2 sister   Hypertension Sister    Hypertension Mother    Heart disease Father    Hypertension Brother      Social History   Tobacco Use   Smoking status: Former    Packs/day: 1.50    Years: 24.00    Pack years: 36.00    Types: Cigarettes    Quit date: 1993    Years since quitting: 29.8   Smokeless tobacco: Never   Tobacco comments:    quit 1995.   Vaping Use   Vaping Use: Never used  Substance Use Topics   Alcohol use: Not Currently   Drug use: No    Allergies as of 11/17/2020 - Review Complete 11/17/2020  Allergen Reaction Noted   Celecoxib Hives 07/25/2016   Hydrocodone Nausea Only 06/02/2020   Pollen extract     Nasacort [triamcinolone] Other (See Comments) 12/07/2015   Vicodin [hydrocodone-acetaminophen] Nausea Only 09/03/2014    Review of Systems:    All systems reviewed and negative except where noted in HPI.   Physical Exam:   Vital signs in last 24 hours: Temp:  [97.8 F (36.6 C)-99.5 F (37.5 C)] 98 F (36.7 C) (11/02 0748) Pulse Rate:  [54-94] 62 (11/02 0748) Resp:  [15-21] 18 (11/02 0509) BP: (97-151)/(48-68) 97/55 (11/02 0748) SpO2:  [96 %-100 %] 100 % (11/02 0748) Last BM Date: 11/17/20 General:   Pleasant, cooperative in NAD Head:  Normocephalic and atraumatic. Eyes:   No icterus.   Conjunctiva pink. PERRLA. Ears:  Normal auditory acuity. Neck:  Supple; no masses or thyroidomegaly Lungs: Respirations even and unlabored. Lungs clear to auscultation bilaterally.   No wheezes, crackles, or rhonchi.  Heart:  Regular rate and rhythm;  Without murmur, clicks, rubs or gallops Abdomen:  Soft, nondistended, nontender. Normal bowel sounds. No appreciable masses or hepatomegaly.  No rebound or guarding.  Neurologic:  Alert and oriented x3;  grossly normal neurologically. Skin:  Intact without significant lesions or rashes. Cervical Nodes:  No significant cervical adenopathy. Psych:  Alert and cooperative. Normal affect.  LAB RESULTS: Recent Labs    11/16/20 1025 11/17/20 1330 11/17/20 1842 11/18/20 0450  WBC 6.6 5.8  --  6.9  HGB 6.9* 7.4* 6.8* 8.3*  HCT 22.7* 23.7* 22.9* 26.3*  PLT 297 349  --  317   BMET Recent Labs    11/16/20 1025 11/17/20 1330 11/18/20 0450  NA 140 138 140  K 3.8 3.7 3.3*  CL 105 101 104  CO2 28 26 30   GLUCOSE 108* 118* 117*  BUN 13 10 9   CREATININE 0.65 0.68 0.72  CALCIUM 9.0 9.1 8.8*   LFT Recent Labs    11/18/20 0450  PROT 6.3*  ALBUMIN 3.7  AST 28  ALT 17  ALKPHOS 68  BILITOT 0.9   PT/INR No results for input(s): LABPROT, INR in the last 72 hours.  STUDIES: No results found.    Impression / Plan:   Jennifer [name] is a 70 y.o. y/o female with a history of gastric sleeve surgery and then a redo duodenal switch,  admitted with findings of low hemoglobin and history of bright red blood per rectum since 11/11/2020.  Status post transfusion in the  ER.  Anemia is borderline microcytic.  No iron studies have been checked.   Plan 1.  Check iron studies, B12, folate.  If low replace 2.  We will plan for EGD and colonoscopy tomorrow with a history of upper GI surgery and history of rectal bleeding.  If hemorrhoids are seen we will plan for banding of the hemorrhoids in my office next week. 3.  H. pylori serology   I have discussed alternative options, risks & benefits,  which include, but are not limited to, bleeding, infection, perforation,respiratory complication & drug reaction.  The patient agrees with this plan & written consent will be obtained.     Thank you for involving me in the care of this patient.      LOS: 1 day   Jonathon Bellows, MD  11/18/2020, 4:44 PM

## 2020-11-18 NOTE — Progress Notes (Signed)
PROGRESS NOTE    Jennifer [name]  MBE:[id number] DOB: 05-08-1950 DOA: 11/17/2020 PCP: [name], FNP (Confirm with patient/family/NH records and if not entered, this HAS to be entered at [facility] point of entry. "No PCP" if truly none.)   Chief Complaint  Patient presents with   Shortness of Breath    Brief Narrative:  The patient is Jennifer [name] 70 yr old woman who has been having intermittent BPBPR in the setting of chronic melena due to oral iron supplementation for the past couple of weeks. Initially she was going to have an outpatient colonoscopy with Dr. [name] on 12/02/2020. However in the last few days she has had progression of shortness of breath and fatigue with exertion. She came to the ED on 11/16/2020 and was found to have Tylik Treese hgb of 6.9, but left before she could be seen. She returned today at the urging of Dr. [name] who now wishes to perform her procedure as in patient.   The patient's medical history is significant for anxiety, s/p gastric sleeve with recent "redo", intestinal malabsorption due to bariatric surgery, chronic iron deficiency anemia, GERD, Herpes genitalis, hyperlipidemia, hypertension, hypothyroidism, and neuropathy.   The patient denies fevers, chills, chest pain, nausea, vomiting, constipation, diarrhea. No abdominal pain, cough, sputum production, or joint pain/swellling. No neurological changes, headache, or rashes.    She has been started on protonix and has been given Leodis Alcocer transfusion on 1 unit PRBC's. She will be on Sequoyah Ramone clear liquid diet and her hemoglobin and hematocrit will be followed.   Assessment & Plan:   Principal Problem:   Symptomatic anemia Active Problems:   Essential hypertension   Hypothyroidism   GI bleed   Chronic iron deficiency anemia  Symptomatic anemia Hb 6.8 on 11/1 Improved to 8/3 after 1 unit pRBC Labs show iron def anemia - normal b12, folate GI c/s, appreciate recs - planning EGD/colonoscopy tomorrow H pylori serology pending PPI  BID   Chronic iron deficiency anemia Due to intestinal malabsorption related to the patient's gastric sleeve procedure and recent redo of this procedure. She is on oral iron supplements as outpatient.   GI bleed The patient has been experiencing BRBPR intermittently over the past couple of weeks in the setting of chronic melena due to oral iron supplementation. Dr. [name] plans to do colonoscopy/endoscopy as inpatient. She has been placed on Glender Augusta clear liquid diet.   Essential hypertension BP has been Patton Swisher little high today in the ED. She will be continued on losartan 100 mg daily as at home. Monitor.   Hypothyroidism Continue levothyroxine as at home.      DVT prophylaxis: (SCD Code Status: full Family Communication: none at bedside Disposition:   Status is: Inpatient  Remains inpatient appropriate because: need for GI evaluation       Consultants:  GI  Procedures:  none  Antimicrobials:  Anti-infectives (From admission, onward)    Start     Dose/Rate Route Frequency Ordered Stop   11/18/20 1000  acyclovir (ZOVIRAX) 200 MG capsule 400 mg        400 mg Oral Daily 11/17/20 1746            Subjective: No complaints  Objective: Vitals:   11/18/20 0008 11/18/20 0509 11/18/20 0748 11/18/20 1651  BP: (!) 117/48 103/61 (!) 97/55 (!) 108/59  Pulse: 67 (!) 54 62 (!) 59  Resp: 20 18    Temp: 98.3 F (36.8 C) 98.3 F (36.8 C) 98 F (36.7 C) 98.9 F (37.2 C)  TempSrc: Oral Oral Oral Oral  SpO2: 99% 96% 100% 100%  Weight:      Height:        Intake/Output Summary (Last 24 hours) at 11/18/2020 1921 Last data filed at 11/18/2020 1901 Gross per 24 hour  Intake 2520.65 ml  Output --  Net 2520.65 ml   Filed Weights   11/17/20 1327  Weight: 81.6 kg    Examination:  General exam: Appears calm and comfortable  Respiratory system: unlabored Cardiovascular system: RRR Gastrointestinal system: Abdomen is nondistended, soft and nontende Central nervous system: Alert  and oriented. No focal neurological deficits. Extremities: no LEE Skin: No rashes, lesions or ulcers Psychiatry: Judgement and insight appear normal. Mood & affect appropriate.     Data Reviewed: I have personally reviewed following labs and imaging studies  CBC: Recent Labs  Lab 11/16/20 1025 11/17/20 1330 11/17/20 1842 11/18/20 0450  WBC 6.6 5.8  --  6.9  NEUTROABS  --  2.7  --   --   HGB 6.9* 7.4* 6.8* 8.3*  HCT 22.7* 23.7* 22.9* 26.3*  MCV 84.4 82.6  --  83.0  PLT 297 349  --  329    Basic Metabolic Panel: Recent Labs  Lab 11/16/20 1025 11/17/20 1330 11/18/20 0450  NA 140 138 140  K 3.8 3.7 3.3*  CL 105 101 104  CO2 28 26 30   GLUCOSE 108* 118* 117*  BUN 13 10 9   CREATININE 0.65 0.68 0.72  CALCIUM 9.0 9.1 8.8*    GFR: Estimated Creatinine Clearance: 62.4 mL/min (by C-G formula based on SCr of 0.72 mg/dL).  Liver Function Tests: Recent Labs  Lab 11/18/20 0450  AST 28  ALT 17  ALKPHOS 68  BILITOT 0.9  PROT 6.3*  ALBUMIN 3.7    CBG: No results for input(s): GLUCAP in the last 168 hours.   Recent Results (from the past 240 hour(s))  Resp Panel by RT-PCR (Flu Lindie Roberson&B, Covid) Nasopharyngeal Swab     Status: None   Collection Time: 11/17/20  5:01 PM   Specimen: Nasopharyngeal Swab; Nasopharyngeal(NP) swabs in vial transport medium  Result Value Ref Range Status   SARS Coronavirus 2 by RT PCR NEGATIVE NEGATIVE Final    Comment: (NOTE) SARS-CoV-2 target nucleic acids are NOT DETECTED.  The SARS-CoV-2 RNA is generally detectable in upper respiratory specimens during the acute phase of infection. The lowest concentration of SARS-CoV-2 viral copies this assay can detect is 138 copies/mL. Aurielle Slingerland negative result does not preclude SARS-Cov-2 infection and should not be used as the sole basis for treatment or other patient management decisions. Quintyn Dombek negative result may occur with  improper specimen collection/handling, submission of specimen other than nasopharyngeal  swab, presence of viral mutation(s) within the areas targeted by this assay, and inadequate number of viral copies(<138 copies/mL). Alexarae Oliva negative result must be combined with clinical observations, patient history, and epidemiological information. The expected result is Negative.  Fact Sheet for Patients:  EntrepreneurPulse.com.au  Fact Sheet for Healthcare Providers:  IncredibleEmployment.be  This test is no t yet approved or cleared by the [place] FDA and  has been authorized for detection and/or diagnosis of SARS-CoV-2 by FDA under an Emergency Use Authorization (EUA). This EUA will remain  in effect (meaning this test can be used) for the duration of the COVID-19 declaration under Section 564(b)(1) of the Act, 21 U.S.C.section 360bbb-3(b)(1), unless the authorization is terminated  or revoked sooner.       Influenza Michelle Vanhise by PCR NEGATIVE NEGATIVE Final  Influenza B by PCR NEGATIVE NEGATIVE Final    Comment: (NOTE) The Xpert Xpress SARS-CoV-2/FLU/RSV plus assay is intended as an aid in the diagnosis of influenza from Nasopharyngeal swab specimens and should not be used as Gyan Cambre sole basis for treatment. Nasal washings and aspirates are unacceptable for Xpert Xpress SARS-CoV-2/FLU/RSV testing.  Fact Sheet for Patients: EntrepreneurPulse.com.au  Fact Sheet for Healthcare Providers: IncredibleEmployment.be  This test is not yet approved or cleared by the [place] FDA and has been authorized for detection and/or diagnosis of SARS-CoV-2 by FDA under an Emergency Use Authorization (EUA). This EUA will remain in effect (meaning this test can be used) for the duration of the COVID-19 declaration under Section 564(b)(1) of the Act, 21 U.S.C. section 360bbb-3(b)(1), unless the authorization is terminated or revoked.  Performed at [facility], [address], [place], [place] [address]           Radiology Studies: No results found.      Scheduled Meds:  acyclovir  400 mg Oral Daily   gabapentin  300 mg Oral BID   hydrocortisone  25 mg Rectal BID   levothyroxine  50 mcg Oral Daily   loratadine  10 mg Oral Daily   losartan  100 mg Oral QHS   [START ON 11/21/2020] pantoprazole  40 mg Intravenous Q12H   polyethylene glycol-electrolytes  4,000 mL Oral Once   sertraline  100 mg Oral QHS   Continuous Infusions:  sodium chloride       LOS: 1 day    Time spent: over 30 min    Fayrene Helper, MD [facility]   To contact the attending provider between 7A-7P or the covering provider during after hours 7P-7A, please log into the web site [email or web address] and access using universal [place] password for that web site. If you do not have the password, please call the hospital operator.  11/18/2020, 7:21 PM

## 2020-11-19 ENCOUNTER — Inpatient Hospital Stay: Payer: Medicare Other | Admitting: Anesthesiology

## 2020-11-19 ENCOUNTER — Encounter
Admission: EM | Disposition: A | Discharge status: Home / Self Care | Payer: Self-pay | Source: Home / Self Care | Attending: Family Medicine

## 2020-11-19 DIAGNOSIS — D649 Anemia, unspecified: Secondary | ICD-10-CM | POA: Diagnosis not present

## 2020-11-19 HISTORY — PX: COLONOSCOPY WITH PROPOFOL: SHX5780

## 2020-11-19 HISTORY — PX: ESOPHAGOGASTRODUODENOSCOPY (EGD) WITH PROPOFOL: SHX5813

## 2020-11-19 LAB — COMPREHENSIVE METABOLIC PANEL
ALT: 15 U/L (ref 0–44)
ALT: 14 U/L (ref 0–44)
AST: 23 U/L (ref 15–41)
AST: 24 U/L (ref 15–41)
Albumin: 3.6 g/dL (ref 3.5–5.0)
Albumin: 3 g/dL — ABNORMAL LOW (ref 3.5–5.0)
Alkaline Phosphatase: 65 U/L (ref 38–126)
Alkaline Phosphatase: 60 U/L (ref 38–126)
Anion gap: 5 (ref 5–15)
Anion gap: 7 (ref 5–15)
BUN: 5 mg/dL — ABNORMAL LOW (ref 8–23)
BUN: 6 mg/dL — ABNORMAL LOW (ref 8–23)
CO2: 29 mmol/L (ref 22–32)
CO2: 23 mmol/L (ref 22–32)
Calcium: 8.9 mg/dL (ref 8.9–10.3)
Calcium: 8.3 mg/dL — ABNORMAL LOW (ref 8.9–10.3)
Chloride: 108 mmol/L (ref 98–111)
Chloride: 110 mmol/L (ref 98–111)
Creatinine, Ser: 0.66 mg/dL (ref 0.44–1.00)
Creatinine, Ser: 0.69 mg/dL (ref 0.44–1.00)
GFR, Estimated: >60 mL/min (ref >60)
GFR, Estimated: >60 mL/min (ref >60)
Glucose, Bld: 103 mg/dL — ABNORMAL HIGH (ref 70–99)
Glucose, Bld: 111 mg/dL — ABNORMAL HIGH (ref 70–99)
Potassium: 3.7 mmol/L (ref 3.5–5.1)
Potassium: 3.9 mmol/L (ref 3.5–5.1)
Sodium: 142 mmol/L (ref 135–145)
Sodium: 140 mmol/L (ref 135–145)
Total Bilirubin: 0.9 mg/dL (ref 0.3–1.2)
Total Bilirubin: 0.8 mg/dL (ref 0.3–1.2)
Total Protein: 6 g/dL — ABNORMAL LOW (ref 6.5–8.1)
Total Protein: 5.6 g/dL — ABNORMAL LOW (ref 6.5–8.1)

## 2020-11-19 LAB — TYPE AND SCREEN
ABO/RH(D): O POS
Antibody Screen: NEGATIVE

## 2020-11-19 LAB — CBC WITH DIFFERENTIAL/PLATELET
Abs Immature Granulocytes: 0.02 10*3/uL (ref 0.00–0.07)
Basophils Absolute: 0.1 10*3/uL (ref 0.0–0.1)
Basophils Relative: 1 %
Eosinophils Absolute: 0.1 10*3/uL (ref 0.0–0.5)
Eosinophils Relative: 1 %
HCT: 25.4 % — ABNORMAL LOW (ref 36.0–46.0)
Hemoglobin: 7.8 g/dL — ABNORMAL LOW (ref 12.0–15.0)
Immature Granulocytes: 0 %
Lymphocytes Relative: 33 %
Lymphs Abs: 2.2 10*3/uL (ref 0.7–4.0)
MCH: 26.1 pg (ref 26.0–34.0)
MCHC: 30.7 g/dL (ref 30.0–36.0)
MCV: 84.9 fL (ref 80.0–100.0)
Monocytes Absolute: 0.8 10*3/uL (ref 0.1–1.0)
Monocytes Relative: 12 %
Neutro Abs: 3.5 10*3/uL (ref 1.7–7.7)
Neutrophils Relative %: 53 %
Platelets: 303 10*3/uL (ref 150–400)
RBC: 2.99 MIL/uL — ABNORMAL LOW (ref 3.87–5.11)
RDW: 17.7 % — ABNORMAL HIGH (ref 11.5–15.5)
WBC: 6.7 10*3/uL (ref 4.0–10.5)
nRBC: 0.5 % — ABNORMAL HIGH (ref 0.0–0.2)

## 2020-11-19 LAB — HEMOGLOBIN AND HEMATOCRIT, BLOOD
HCT: 25.7 % — ABNORMAL LOW (ref 36.0–46.0)
HCT: 34.7 % — ABNORMAL LOW (ref 36.0–46.0)
Hemoglobin: 7.6 g/dL — ABNORMAL LOW (ref 12.0–15.0)
Hemoglobin: 10.5 g/dL — ABNORMAL LOW (ref 12.0–15.0)

## 2020-11-19 LAB — PHOSPHORUS: Phosphorus: 3.5 mg/dL (ref 2.5–4.6)

## 2020-11-19 LAB — MAGNESIUM: Magnesium: 2.2 mg/dL (ref 1.7–2.4)

## 2020-11-19 LAB — TROPONIN I (HIGH SENSITIVITY)
Troponin I (High Sensitivity): 4 ng/L (ref <18)
Troponin I (High Sensitivity): 4 ng/L (ref <18)

## 2020-11-19 LAB — GLUCOSE, CAPILLARY: Glucose-Capillary: 119 mg/dL — ABNORMAL HIGH (ref 70–99)

## 2020-11-19 SURGERY — ESOPHAGOGASTRODUODENOSCOPY (EGD) WITH PROPOFOL
Anesthesia: General

## 2020-11-19 MED ORDER — DEXMEDETOMIDINE (PRECEDEX) IN NS 20 MCG/5ML (4 MCG/ML) IV SYRINGE
PREFILLED_SYRINGE | INTRAVENOUS | Status: DC | PRN
  Administered 2020-11-19: 12 ug via INTRAVENOUS

## 2020-11-19 MED ORDER — MENTHOL 3 MG MT LOZG
1.0000 | LOZENGE | OROMUCOSAL | Status: DC | PRN
  Administered 2020-11-20: 3 mg via ORAL
  Filled 2020-11-19: qty 9

## 2020-11-19 MED ORDER — FENTANYL CITRATE PF 50 MCG/ML IJ SOSY
12.5000 ug | PREFILLED_SYRINGE | Freq: Once | INTRAMUSCULAR | Status: AC
  Administered 2020-11-19: 12.5 ug via INTRAVENOUS
  Filled 2020-11-19: qty 1

## 2020-11-19 MED ORDER — PROPOFOL 10 MG/ML IV BOLUS
INTRAVENOUS | Status: DC | PRN
  Administered 2020-11-19: 10 mg via INTRAVENOUS
  Administered 2020-11-19: 90 mg via INTRAVENOUS

## 2020-11-19 MED ORDER — PROPOFOL 500 MG/50ML IV EMUL
INTRAVENOUS | Status: DC | PRN
  Administered 2020-11-19: 150 ug/kg/min via INTRAVENOUS

## 2020-11-19 MED ORDER — EPHEDRINE SULFATE 50 MG/ML IJ SOLN
INTRAMUSCULAR | Status: DC | PRN
  Administered 2020-11-19 (×2): 10 mg via INTRAVENOUS

## 2020-11-19 MED ORDER — LIDOCAINE HCL (CARDIAC) PF 100 MG/5ML IV SOSY
PREFILLED_SYRINGE | INTRAVENOUS | Status: DC | PRN
  Administered 2020-11-19: 40 mg via INTRAVENOUS

## 2020-11-19 MED ORDER — POLYVINYL ALCOHOL 1.4 % OP SOLN
1.0000 [drp] | OPHTHALMIC | Status: DC | PRN
  Administered 2020-11-19: 1 [drp] via OPHTHALMIC
  Filled 2020-11-19: qty 15

## 2020-11-19 MED ORDER — PROPOFOL 500 MG/50ML IV EMUL
INTRAVENOUS | Status: AC
  Filled 2020-11-19: qty 50

## 2020-11-19 MED ORDER — SODIUM CHLORIDE 0.9 % IV SOLN
250.0000 mg | Freq: Once | INTRAVENOUS | Status: AC
  Administered 2020-11-19: 250 mg via INTRAVENOUS
  Filled 2020-11-19: qty 20

## 2020-11-19 MED ORDER — FERROUS SULFATE 325 (65 FE) MG PO TABS: 325.0000 mg | ORAL_TABLET | Freq: Every day | ORAL | 1 refills | Status: DC

## 2020-11-19 MED ORDER — PANTOPRAZOLE SODIUM 40 MG PO TBEC: 40.0000 mg | DELAYED_RELEASE_TABLET | Freq: Every day | ORAL | 1 refills | Status: DC

## 2020-11-19 MED ORDER — SIMETHICONE 80 MG PO CHEW
160.0000 mg | CHEWABLE_TABLET | Freq: Once | ORAL | Status: AC
  Administered 2020-11-19: 160 mg via ORAL
  Filled 2020-11-19 (×2): qty 2

## 2020-11-19 MED ORDER — LIDOCAINE HCL (PF) 2 % IJ SOLN
INTRAMUSCULAR | Status: AC
  Filled 2020-11-19: qty 5

## 2020-11-19 MED ORDER — EPHEDRINE 5 MG/ML INJ
INTRAVENOUS | Status: AC
  Filled 2020-11-19: qty 5

## 2020-11-19 NOTE — Progress Notes (Signed)
   Name Jennifer [name] Date 11/19/2020   MRN [id number] Time 2040   Subjective: Patient complaining of right flank pain that radiates up across her back down left arm that started about 45 minute ago post iron transfusion.  Also reports han d swelling.  Denies similar symptoms in past. Denies burning with urination or frequency.. Denies onset of symptoms in relation to meal HPA - admitted with symptomatic anemia.  Found to be iron deficient.  Upper and lower scopes today: without acute findings.     Objective: Vitals:   11/19/20 1615 11/19/20 1957  BP: 118/68 106/61  Pulse: (!) 57 72  Resp: 16 16  Temp: 99 F (37.2 C) 98.2 F (36.8 C)  SpO2: 97% 100%     Assessment:  Neuro A & O Respiration unlabored Abdomen - severe left flank and RUQ tenderness CV NSR   Plan: EKG:  SR  CMP anfd troponin - normal H & H improved from 7.6/35/7 to 10.5/25,7 Fentnyl x1 given as w;; as simethicone.

## 2020-11-19 NOTE — Op Note (Signed)
[facility] Gastroenterology Patient Name: Jennifer [name] Procedure Date: 11/19/2020 12:27 PM MRN: [id number] Account #: [id number] Date of Birth: 12/26/1950 Admit Type: Outpatient Age: 70 Room: [facility] ENDO ROOM 3 Gender: Female Note Status: Finalized Instrument Name: Altamese Cabal Endoscope [id number] Procedure:             Upper GI endoscopy Indications:           Melena Providers:             Jonathon Bellows MD, MD Referring MD:          Yvetta Coder. Arnett (Referring MD) Medicines:             Monitored Anesthesia Care Complications:         No immediate complications. Procedure:             Pre-Anesthesia Assessment:                        - Prior to the procedure, a History and Physical was                         performed, and patient medications, allergies and                         sensitivities were reviewed. The patient's tolerance                         of previous anesthesia was reviewed.                        - The risks and benefits of the procedure and the                         sedation options and risks were discussed with the                         patient. All questions were answered and informed                         consent was obtained.                        - ASA Grade Assessment: II - A patient with mild                         systemic disease.                        - After reviewing the risks and benefits, the patient                         was deemed in satisfactory condition to undergo the                         procedure.                        After obtaining informed consent, the endoscope was                         passed under direct vision. Throughout the  procedure,                         the patient's blood pressure, pulse, and oxygen                         saturations were monitored continuously. The Endoscope                         was introduced through the mouth, and advanced to the                         third part of  duodenum. The upper GI endoscopy was                         accomplished without difficulty. The patient tolerated                         the procedure well. Findings:      The esophagus was normal.      The entire examined stomach was normal.      There was evidence of a widely patent previous surgical anastomosis in       the second portion of the duodenum. Biopsies for histology were taken       with a cold forceps for evaluation of celiac disease.      The cardia and gastric fundus were normal on retroflexion. Impression:            - Normal esophagus.                        - Normal stomach.                        - Widely patent previous surgical anastomosis was                         found in the duodenum. Biopsied. Recommendation:        - Await pathology results.                        - Perform a colonoscopy today. Procedure Code(s):     --- Professional ---                        [phone], Esophagogastroduodenoscopy, flexible,                         transoral; with biopsy, single or multiple Diagnosis Code(s):     --- Professional ---                        Z98.0, Intestinal bypass and anastomosis status                        K92.1, Melena (includes Hematochezia) CPT copyright 2019 American Medical Association. All rights reserved. The codes documented in this report are preliminary and upon coder review may  be revised to meet current compliance requirements. Jonathon Bellows, MD Jonathon Bellows MD, MD 11/19/2020 12:42:43 PM This report has been signed electronically. Number of Addenda: 0 Note Initiated On: 11/19/2020 12:27 PM Estimated Blood Loss:  Estimated  blood loss: none.      [facility]

## 2020-11-19 NOTE — Plan of Care (Signed)

## 2020-11-19 NOTE — H&P (Signed)
Jonathon Bellows, MD [address] [place], [place], [place], [address] [address], [place], [place], [place], [address] Phone: [phone]  Fax: [phone]  Primary Care Physician:  [name], FNP   Pre-Procedure History & Physical: HPI:  Jennifer [name] is a 70 y.o. female is here for an endoscopy and colonoscopy    Past Medical History:  Diagnosis Date   Anxiety    Bariatric surgery status    Constipation    Dysrhythmia    Elevated liver enzymes    GERD (gastroesophageal reflux disease)    Hemorrhoids    Herpes genitalis    High cholesterol    Hyperlipidemia    Hypertension    Hypothyroidism    Neuropathy    Osteoarthritis    Sleep apnea     Past Surgical History:  Procedure Laterality Date   ABDOMINAL HYSTERECTOMY     total for fibroids no h/o abnormal pap   bariatric sleeve  2015   BREAST EXCISIONAL BIOPSY Left 1998   carpal tunnel repair     COLONOSCOPY WITH PROPOFOL N/A 02/11/2016   Procedure: COLONOSCOPY WITH PROPOFOL;  Surgeon: Jonathon Bellows, MD;  Location: [facility] ENDOSCOPY;  Service: Endoscopy;  Laterality: N/A;   COLONOSCOPY WITH PROPOFOL N/A 10/01/2019   Procedure: COLONOSCOPY WITH PROPOFOL;  Surgeon: Jonathon Bellows, MD;  Location: [facility] ENDOSCOPY;  Service: Gastroenterology;  Laterality: N/A;   ESOPHAGOGASTRODUODENOSCOPY (EGD) WITH PROPOFOL N/A 12/02/2019   Procedure: ESOPHAGOGASTRODUODENOSCOPY (EGD) WITH PROPOFOL;  Surgeon: Jonathon Bellows, MD;  Location: [facility] ENDOSCOPY;  Service: Gastroenterology;  Laterality: N/A;   HEMORRHOID SURGERY      Prior to Admission medications   Medication Sig Start Date End Date Taking? Authorizing Provider  acetaminophen (TYLENOL) 325 MG tablet Take 2 tablets (650 mg total) by mouth every 6 (six) hours as needed for mild pain (or Fever >/= 101). Patient taking differently: Take 2,000 mg by mouth every 6 (six) hours as needed for mild pain (or Fever >/= 101). 06/12/20  Yes Nolberto Hanlon, MD  acyclovir (ZOVIRAX) 400 MG  tablet Take 400 mg by mouth daily. 07/13/20  Yes [provider]  calcium citrate (CALCITRATE - DOSED IN MG ELEMENTAL CALCIUM) 950 (200 Ca) MG tablet Take 200 mg of elemental calcium by mouth daily.   Yes [provider]  cetirizine (ZYRTEC) 10 MG tablet Take 10 mg by mouth daily.   Yes [provider]  famotidine (PEPCID) 20 MG tablet TAKE 1 TABLET BY MOUTH EVERY NIGHT AT BEDTIME 09/01/20  Yes Arnett, Yvetta Coder, FNP  fluticasone (FLONASE) 50 MCG/ACT nasal spray Place 1 spray into both nostrils daily.   Yes [provider]  gabapentin (NEURONTIN) 300 MG capsule Take 1 capsule (300 mg total) by mouth 2 (two) times daily. 11/11/20  Yes Arnett, Yvetta Coder, FNP  hydrocortisone (ANUSOL-HC) 25 MG suppository Place 1 suppository (25 mg total) rectally 2 (two) times daily. 11/11/20  Yes Arnett, Yvetta Coder, FNP  hydrocortisone cream 1 % Apply 1 application topically 2 (two) times daily.   Yes [provider]  levothyroxine (SYNTHROID) 50 MCG tablet TAKE 1 TABLET BY MOUTH EVERY DAY 09/23/20  Yes [name], FNP  losartan (COZAAR) 100 MG tablet TAKE 1 TABLET BY MOUTH EVERY DAY AT BEDTIME 10/30/20  Yes Arnett, Yvetta Coder, FNP  Multiple Vitamin (MULTIVITAMIN WITH MINERALS) TABS tablet Take 1 tablet by mouth daily.   Yes [provider]  sertraline (ZOLOFT) 100 MG tablet Take 1 tablet (100 mg total) by mouth  at bedtime. 11/11/20  Yes [name], FNP  triamterene-hydrochlorothiazide (MAXZIDE-25) 37.5-25 MG tablet Take 1 tablet by mouth daily.   Yes [provider]    Allergies as of 11/17/2020 - Review Complete 11/17/2020  Allergen Reaction Noted   Celecoxib Hives 07/25/2016   Hydrocodone Nausea Only 06/02/2020   Pollen extract     Nasacort [triamcinolone] Other (See Comments) 12/07/2015   Vicodin [hydrocodone-acetaminophen] Nausea Only 09/03/2014    Family History  Problem Relation Age of Onset   Breast cancer Sister 39        materal 1/2 sister   Hypertension Sister    Hypertension Mother    Heart disease Father    Hypertension Brother     Social History   Socioeconomic History   Marital status: Married    Spouse name: Not on file   Number of children: Not on file   Years of education: Not on file   Highest education level: Not on file  Occupational History   Not on file  Tobacco Use   Smoking status: Former    Packs/day: 1.50    Years: 24.00    Pack years: 36.00    Types: Cigarettes    Quit date: 1993    Years since quitting: 29.8   Smokeless tobacco: Never   Tobacco comments:    quit 1995.   Vaping Use   Vaping Use: Never used  Substance and Sexual Activity   Alcohol use: Not Currently   Drug use: No   Sexual activity: Not Currently    Birth control/protection: Surgical    Comment: Hysterectomy  Other Topics Concern   Not on file  Social History Narrative   Lives in [place].    Married.    Retired 2015, Interior and spatial designer.    One son; granddaughter.    Left handed    Caffeine- decaf coffee.    Social Determinants of Health   Financial Resource Strain: Low Risk    Difficulty of Paying Living Expenses: Not hard at all  Food Insecurity: No Food Insecurity   Worried About Charity fundraiser in the Last Year: Never true   [place] in the Last Year: Never true  Transportation Needs: No Transportation Needs   Lack of Transportation (Medical): No   Lack of Transportation (Non-Medical): No  Physical Activity: Not on file  Stress: No Stress Concern Present   Feeling of Stress : Not at all  Social Connections: Unknown   Frequency of Communication with Friends and Family: More than three times a week   Frequency of Social Gatherings with Friends and Family: More than three times a week   Attends Religious Services: Not on Electrical engineer or Organizations: Not on file   Attends Archivist Meetings: Not on file   Marital Status: Not on file   Intimate Partner Violence: Not At Risk   Fear of Current or Ex-Partner: No   Emotionally Abused: No   Physically Abused: No   Sexually Abused: No    Review of Systems: See HPI, otherwise negative ROS  Physical Exam: BP 114/62 (BP Location: Left Leg)   Pulse 61   Temp 98.5 F (36.9 C) (Oral)   Resp 16   Ht 5\' 1"  (1.549 m)   Wt 81.6 kg   SpO2 99%   BMI 33.99 kg/m  General:   Alert,  pleasant and cooperative in NAD Head:  Normocephalic and atraumatic. Neck:  Supple;  no masses or thyromegaly. Lungs:  Clear throughout to auscultation, normal respiratory effort.    Heart:  +S1, +S2, Regular rate and rhythm, No edema. Abdomen:  Soft, nontender and nondistended. Normal bowel sounds, without guarding, and without rebound.   Neurologic:  Alert and  oriented x4;  grossly normal neurologically.  Impression/Plan: Jennifer [name] is here for an endoscopy and colonoscopy  to be performed for  evaluation of GI bleed    Risks, benefits, limitations, and alternatives regarding endoscopy have been reviewed with the patient.  Questions have been answered.  All parties agreeable.   Jonathon Bellows, MD  11/19/2020, 12:18 PM

## 2020-11-19 NOTE — Anesthesia Procedure Notes (Signed)
Date/Time: 11/19/2020 12:31 PM Performed by: Doreen Salvage, CRNA Pre-anesthesia Checklist: Patient identified, Emergency Drugs available, Suction available and Patient being monitored Patient Re-evaluated:Patient Re-evaluated prior to induction Oxygen Delivery Method: Nasal cannula Induction Type: IV induction Dental Injury: Teeth and Oropharynx as per pre-operative assessment  Comments: Nasal cannula with etCO2 monitoring

## 2020-11-19 NOTE — Op Note (Signed)
[facility] Gastroenterology Patient Name: Jennifer [name] Procedure Date: 11/19/2020 12:26 PM MRN: [id number] Account #: [id number] Date of Birth: 07-01-1950 Admit Type: Inpatient Age: 70 Room: [facility] ENDO ROOM 3 Gender: Female Note Status: Finalized Instrument Name: Jasper Riling [id number] Procedure:             Colonoscopy Indications:           Melena Providers:             Jonathon Bellows MD, MD Referring MD:          Yvetta Coder. Arnett (Referring MD) Medicines:             Monitored Anesthesia Care Complications:         No immediate complications. Procedure:             Pre-Anesthesia Assessment:                        - Prior to the procedure, a History and Physical was                         performed, and patient medications, allergies and                         sensitivities were reviewed. The patient's tolerance                         of previous anesthesia was reviewed.                        - The risks and benefits of the procedure and the                         sedation options and risks were discussed with the                         patient. All questions were answered and informed                         consent was obtained.                        - ASA Grade Assessment: II - A patient with mild                         systemic disease.                        After obtaining informed consent, the colonoscope was                         passed under direct vision. Throughout the procedure,                         the patient's blood pressure, pulse, and oxygen                         saturations were monitored continuously. The                         Colonoscope was introduced through the anus and  advanced to the the cecum, identified by the                         appendiceal orifice. The colonoscopy was performed                         with ease. The patient tolerated the procedure well.                         The  quality of the bowel preparation was poor. Findings:      The perianal and digital rectal examinations were normal.      Non-bleeding internal hemorrhoids were found during retroflexion. The       hemorrhoids were large and Grade I (internal hemorrhoids that do not       prolapse).      The exam was otherwise without abnormality on direct and retroflexion       views. Impression:            - Preparation of the colon was poor.                        - Non-bleeding internal hemorrhoids.                        - The examination was otherwise normal on direct and                         retroflexion views.                        - No specimens collected. Recommendation:        - Resume previous diet.                        - Continue present medications.                        - Return to my office in 4 weeks.                        - Return patient to hospital ward for possible                         discharge same day. Procedure Code(s):     --- Professional ---                        [phone], Colonoscopy, flexible; diagnostic, including                         collection of specimen(s) by brushing or washing, when                         performed (separate procedure) Diagnosis Code(s):     --- Professional ---                        K64.0, First degree hemorrhoids                        K92.1, Melena (includes Hematochezia) CPT copyright 2019 American Medical Association.  All rights reserved. The codes documented in this report are preliminary and upon coder review may  be revised to meet current compliance requirements. Jonathon Bellows, MD Jonathon Bellows MD, MD 11/19/2020 1:01:31 PM This report has been signed electronically. Number of Addenda: 0 Note Initiated On: 11/19/2020 12:26 PM Scope Withdrawal Time: 0 hours 4 minutes 49 seconds  Total Procedure Duration: 0 hours 13 minutes 53 seconds  Estimated Blood Loss:  Estimated blood loss: none.      [facility]

## 2020-11-19 NOTE — Plan of Care (Signed)
Patient ID: [name], female   DOB: 10-15-1950, 70 y.o.   MRN: [id number] Patient ID: Brexley Cutshaw, female   DOB: 09-22-1950, 70 y.o.   MRN: [id number]  Problem: Education: Goal: Ability to identify signs and symptoms of gastrointestinal bleeding will improve Outcome: Progressing   Problem: Bowel/Gastric: Goal: Will show no signs and symptoms of gastrointestinal bleeding Outcome: Progressing   Problem: Fluid Volume: Goal: Will show no signs and symptoms of excessive bleeding Outcome: Progressing   Problem: Clinical Measurements: Goal: Complications related to the disease process, condition or treatment will be avoided or minimized Outcome: Progressing   Problem: Education: Goal: Knowledge of General Education information will improve Description: Including pain rating scale, medication(s)/side effects and non-pharmacologic comfort measures Outcome: Progressing   Problem: Health Behavior/Discharge Planning: Goal: Ability to manage health-related needs will improve Outcome: Progressing   Problem: Clinical Measurements: Goal: Ability to maintain clinical measurements within normal limits will improve Outcome: Progressing Goal: Will remain free from infection Outcome: Progressing Goal: Diagnostic test results will improve Outcome: Progressing Goal: Respiratory complications will improve Outcome: Progressing Goal: Cardiovascular complication will be avoided Outcome: Progressing   Problem: Activity: Goal: Risk for activity intolerance will decrease Outcome: Progressing   Problem: Nutrition: Goal: Adequate nutrition will be maintained Outcome: Progressing   Problem: Coping: Goal: Level of anxiety will decrease Outcome: Progressing   Problem: Elimination: Goal: Will not experience complications related to bowel motility Outcome: Progressing Goal: Will not experience complications related to urinary retention Outcome: Progressing   Problem: Pain  Managment: Goal: General experience of comfort will improve Outcome: Progressing   Problem: Safety: Goal: Ability to remain free from injury will improve Outcome: Progressing   Problem: Skin Integrity: Goal: Risk for impaired skin integrity will decrease Outcome: Progressing   Haydee Salter, RN

## 2020-11-19 NOTE — Transfer of Care (Signed)
Immediate Anesthesia Transfer of Care Note  Patient: Jennifer [name]  Procedure(s) Performed: Procedure(s): ESOPHAGOGASTRODUODENOSCOPY (EGD) WITH PROPOFOL (N/A) COLONOSCOPY WITH PROPOFOL (N/A)  Patient Location: PACU and Endoscopy Unit  Anesthesia Type:General  Level of Consciousness: sedated  Airway & Oxygen Therapy: Patient Spontanous Breathing and Patient connected to nasal cannula oxygen  Post-op Assessment: Report given to RN and Post -op Vital signs reviewed and stable  Post vital signs: Reviewed and stable  Last Vitals:  Vitals:   11/19/20 0826 11/19/20 1301  BP: 114/62 (!) 98/42  Pulse: 61 73  Resp: 16   Temp: 36.9 C (!) 36.2 C  SpO2: 83% 25%    Complications: No apparent anesthesia complications

## 2020-11-19 NOTE — Discharge Summary (Signed)
Physician Discharge Summary  Jennifer [name] VZD:[id number] DOB: Nov 18, 1950 DOA: 11/17/2020  PCP: [name], FNP  Admit date: 11/17/2020 Discharge date: 11/19/2020  Time spent: 40 minutes  Recommendations for Outpatient Follow-up:  Follow outpatient CBC/CMP Follow biopsy with GI outpatient Follow anemia outpatient, iron deficiency Follow h pylori serologies   Given downtrending Hb, will keep until tomorrow and follow until stable  Discharge Diagnoses:  Principal Problem:   Symptomatic anemia Active Problems:   Essential hypertension   Hypothyroidism   GI bleed   Chronic iron deficiency anemia   Discharge Condition: stable  Diet recommendation: heart healthy  Filed Weights   11/17/20 1327  Weight: 81.6 kg    History of present illness:  The patient is Jennifer [name] 70 yr old woman who has been having intermittent BPBPR in the setting of chronic melena due to oral iron supplementation for the past couple of weeks. Initially she was going to have an outpatient colonoscopy with Dr. [name] on 12/02/2020. However in the last few days she has had progression of shortness of breath and fatigue with exertion. She came to the ED on 11/16/2020 and was found to have Danett Palazzo hgb of 6.9, but left before she could be seen. She returned today at the urging of Dr. [name] who now wishes to perform her procedure as in patient.   The patient's medical history is significant for anxiety, s/p gastric sleeve with recent "redo", intestinal malabsorption due to bariatric surgery, chronic iron deficiency anemia, GERD, Herpes genitalis, hyperlipidemia, hypertension, hypothyroidism, and neuropathy.   The patient denies fevers, chills, chest pain, nausea, vomiting, constipation, diarrhea. No abdominal pain, cough, sputum production, or joint pain/swellling. No neurological changes, headache, or rashes.    She has been started on protonix and has been given [place] transfusion on 1 unit PRBC's. She will be on Ameliana Brashear clear liquid  diet and her hemoglobin and hematocrit will be followed.  She's now s/p EGD/colonoscopy without notable findings.  Biopsy pending.  Hb trending down.  Follow 11/4 and likely discharge in AM.   Hospital Course:  Symptomatic anemia  GI bleed Hb 6.8 on 11/1 It's fluctuated widely after 1 unit pRBC, 7.6 today, which is down from 8.8 yesterday, could be equlibration, but due to downtrend, will follow up tomorrow prior to discharge Labs show iron def anemia - normal b12, folate GI c/s, appreciate recs - planning EGD/colonoscopy tomorrow (as below - without notable findings, surgical anastomosis biopsied) follow pending biopsy results H pylori serology pending PPI BID Now s/p EGD/colonoscopy as noted above   Chronic iron deficiency anemia Due to intestinal malabsorption related to the patient's gastric sleeve procedure and recent redo of this procedure. She is on oral iron supplements as outpatient.   Essential hypertension BP has been Nethra Mehlberg little high today in the ED. She will be continued on losartan 100 mg daily as at home. Monitor.   Hypothyroidism Continue levothyroxine as at home.   Procedures: EGD/Colonoscopy Normal esophagus, stomach, widely patent previous surgical anastomosis - biopsied Poor preparation of colon, nonbleeding internal hemorrhoids, otherwise normal examination Follow up with GI in 4 weeks  Consultations: GI  Discharge Exam: Vitals:   11/19/20 1437 11/19/20 1615  BP: 111/66 118/68  Pulse: (!) 57 (!) 57  Resp: 17 16  Temp: 98.5 F (36.9 C) 99 F (37.2 C)  SpO2: 99% 97%   Feels well, no complaints  General: No acute distress. Cardiovascular: RRR Lungs: unlabored Abdomen: Soft, nontender, nondistended Neurological: Alert and oriented 3. Moves all extremities 4 .  Cranial nerves II through XII grossly intact. Skin: Warm and dry. No rashes or lesions. Extremities: No clubbing or cyanosis. No edema.  Discharge Instructions   Discharge Instructions      Call MD for:  difficulty breathing, headache or visual disturbances   Complete by: As directed    Call MD for:  extreme fatigue   Complete by: As directed    Call MD for:  hives   Complete by: As directed    Call MD for:  persistant dizziness or light-headedness   Complete by: As directed    Call MD for:  persistant nausea and vomiting   Complete by: As directed    Call MD for:  redness, tenderness, or signs of infection (pain, swelling, redness, odor or green/yellow discharge around incision site)   Complete by: As directed    Call MD for:  severe uncontrolled pain   Complete by: As directed    Call MD for:  temperature >100.4   Complete by: As directed    Diet - low sodium heart healthy   Complete by: As directed    Discharge instructions   Complete by: As directed    You were seen for anemia (low blood counts) and iron deficiency.  You had and EGD and colonoscopy with Dr. [name] which were unrevealing for Windy Dudek source.  Dr. [name] took biopsies which should be followed outpatient with him.    Continue protonix daily.  We gave you Cylie Dor dose of IV iron prior to discharge and we'll discharge you with iron to take by mouth daily.  Follow up with your PCP within [place] week for repeat labs to check your hemoglobin.  You'll need additional workup for your iron deficiency anemia outpatient.  Return for new, recurrent, or worsening symptoms.  Please ask your PCP to request records from this hospitalization so they know what was done and what the next steps will be.   Increase activity slowly   Complete by: As directed       Allergies as of 11/19/2020       Reactions   Celecoxib Hives   Hydrocodone Nausea Only   Pollen Extract    Nasacort [triamcinolone] Other (See Comments)   Nasal - Nose Bleeds   Vicodin [hydrocodone-acetaminophen] Nausea Only        Medication List     STOP taking these medications    famotidine 20 MG tablet Commonly known as: PEPCID       TAKE these  medications    acetaminophen 325 MG tablet Commonly known as: TYLENOL Take 2 tablets (650 mg total) by mouth every 6 (six) hours as needed for mild pain (or Fever >/= 101). What changed: how much to take   acyclovir 400 MG tablet Commonly known as: ZOVIRAX Take 400 mg by mouth daily.   calcium citrate 950 (200 Ca) MG tablet Commonly known as: CALCITRATE - dosed in mg elemental calcium Take 200 mg of elemental calcium by mouth daily.   cetirizine 10 MG tablet Commonly known as: ZYRTEC Take 10 mg by mouth daily.   ferrous sulfate 325 (65 FE) MG tablet Take 1 tablet (325 mg total) by mouth daily with breakfast.   fluticasone 50 MCG/ACT nasal spray Commonly known as: FLONASE Place 1 spray into both nostrils daily.   gabapentin 300 MG capsule Commonly known as: NEURONTIN Take 1 capsule (300 mg total) by mouth 2 (two) times daily.   hydrocortisone 25 MG suppository Commonly known as: ANUSOL-HC Place 1 suppository (25  mg total) rectally 2 (two) times daily.   hydrocortisone cream 1 % Apply 1 application topically 2 (two) times daily.   levothyroxine 50 MCG tablet Commonly known as: SYNTHROID TAKE 1 TABLET BY MOUTH EVERY DAY   losartan 100 MG tablet Commonly known as: COZAAR TAKE 1 TABLET BY MOUTH EVERY DAY AT BEDTIME   multivitamin with minerals Tabs tablet Take 1 tablet by mouth daily.   pantoprazole 40 MG tablet Commonly known as: Protonix Take 1 tablet (40 mg total) by mouth daily.   sertraline 100 MG tablet Commonly known as: ZOLOFT Take 1 tablet (100 mg total) by mouth at bedtime.   triamterene-hydrochlorothiazide 37.5-25 MG tablet Commonly known as: MAXZIDE-25 Take 1 tablet by mouth daily.       Allergies  Allergen Reactions   Celecoxib Hives   Hydrocodone Nausea Only   Pollen Extract    Nasacort [Triamcinolone] Other (See Comments)    Nasal - Nose Bleeds   Vicodin [Hydrocodone-Acetaminophen] Nausea Only      The results of significant  diagnostics from this hospitalization (including imaging, microbiology, ancillary and laboratory) are listed below for reference.    Significant Diagnostic Studies: DG Chest 2 View  Result Date: 11/16/2020 CLINICAL DATA:  Shortness of breath. EXAM: CHEST - 2 VIEW COMPARISON:  06/09/2020 FINDINGS: The heart size and mediastinal contours are within normal limits. Both lungs are clear. The visualized skeletal structures are unremarkable. IMPRESSION: No active cardiopulmonary disease. Electronically Signed   By: Markus Daft M.D.   On: 11/16/2020 10:50    Microbiology: Recent Results (from the past 240 hour(s))  Resp Panel by RT-PCR (Flu Elodie Panameno&B, Covid) Nasopharyngeal Swab     Status: None   Collection Time: 11/17/20  5:01 PM   Specimen: Nasopharyngeal Swab; Nasopharyngeal(NP) swabs in vial transport medium  Result Value Ref Range Status   SARS Coronavirus 2 by RT PCR NEGATIVE NEGATIVE Final    Comment: (NOTE) SARS-CoV-2 target nucleic acids are NOT DETECTED.  The SARS-CoV-2 RNA is generally detectable in upper respiratory specimens during the acute phase of infection. The lowest concentration of SARS-CoV-2 viral copies this assay can detect is 138 copies/mL. Jasmain Ahlberg negative result does not preclude SARS-Cov-2 infection and should not be used as the sole basis for treatment or other patient management decisions. Mariame Rybolt negative result may occur with  improper specimen collection/handling, submission of specimen other than nasopharyngeal swab, presence of viral mutation(s) within the areas targeted by this assay, and inadequate number of viral copies(<138 copies/mL). Arlyss Weathersby negative result must be combined with clinical observations, patient history, and epidemiological information. The expected result is Negative.  Fact Sheet for Patients:  EntrepreneurPulse.com.au  Fact Sheet for Healthcare Providers:  IncredibleEmployment.be  This test is no t yet approved or cleared  by the [place] FDA and  has been authorized for detection and/or diagnosis of SARS-CoV-2 by FDA under an Emergency Use Authorization (EUA). This EUA will remain  in effect (meaning this test can be used) for the duration of the COVID-19 declaration under Section 564(b)(1) of the Act, 21 U.S.C.section 360bbb-3(b)(1), unless the authorization is terminated  or revoked sooner.       Influenza Buelah Rennie by PCR NEGATIVE NEGATIVE Final   Influenza B by PCR NEGATIVE NEGATIVE Final    Comment: (NOTE) The Xpert Xpress SARS-CoV-2/FLU/RSV plus assay is intended as an aid in the diagnosis of influenza from Nasopharyngeal swab specimens and should not be used as Darcey Cardy sole basis for treatment. Nasal washings and aspirates are unacceptable for Xpert  Xpress SARS-CoV-2/FLU/RSV testing.  Fact Sheet for Patients: EntrepreneurPulse.com.au  Fact Sheet for Healthcare Providers: IncredibleEmployment.be  This test is not yet approved or cleared by the [place] FDA and has been authorized for detection and/or diagnosis of SARS-CoV-2 by FDA under an Emergency Use Authorization (EUA). This EUA will remain in effect (meaning this test can be used) for the duration of the COVID-19 declaration under Section 564(b)(1) of the Act, 21 U.S.C. section 360bbb-3(b)(1), unless the authorization is terminated or revoked.  Performed at [facility], [address] [place], [place] [address]      Labs: Basic Metabolic Panel: Recent Labs  Lab 11/16/20 1025 11/17/20 1330 11/18/20 0450 11/19/20 0418  NA 140 138 140 142  K 3.8 3.7 3.3* 3.7  CL 105 101 104 108  CO2 28 26 30 29   GLUCOSE 108* 118* 117* 103*  BUN 13 10 9  5*  CREATININE 0.65 0.68 0.72 0.66  CALCIUM 9.0 9.1 8.8* 8.9  MG  --   --   --  2.2  PHOS  --   --   --  3.5   Liver Function Tests: Recent Labs  Lab 11/18/20 0450 11/19/20 0418  AST 28 23  ALT 17 15  ALKPHOS 68 65  BILITOT 0.9 0.9  PROT  6.3* 6.0*  ALBUMIN 3.7 3.6   No results for input(s): LIPASE, AMYLASE in the last 168 hours. No results for input(s): AMMONIA in the last 168 hours. CBC: Recent Labs  Lab 11/16/20 1025 11/17/20 1330 11/17/20 1842 11/18/20 0450 11/18/20 1955 11/19/20 0418 11/19/20 1439  WBC 6.6 5.8  --  6.9  --  6.7  --   NEUTROABS  --  2.7  --   --   --  3.5  --   HGB 6.9* 7.4* 6.8* 8.3* 8.8* 7.8* 7.6*  HCT 22.7* 23.7* 22.9* 26.3* 28.8* 25.4* 25.7*  MCV 84.4 82.6  --  83.0  --  84.9  --   PLT 297 349  --  317  --  303  --    Cardiac Enzymes: No results for input(s): CKTOTAL, CKMB, CKMBINDEX, TROPONINI in the last 168 hours. BNP: BNP (last 3 results) No results for input(s): BNP in the last 8760 hours.  ProBNP (last 3 results) No results for input(s): PROBNP in the last 8760 hours.  CBG: No results for input(s): GLUCAP in the last 168 hours.     Signed:  Fayrene Helper MD.  [facility] 11/19/2020, 7:10 PM

## 2020-11-19 NOTE — Anesthesia Preprocedure Evaluation (Signed)
Anesthesia Evaluation  Patient identified by MRN, date of birth, ID band Patient awake    Reviewed: Allergy & Precautions, NPO status , Patient's Chart, lab work & pertinent test results  Airway Mallampati: II  TM Distance: >3 FB Neck ROM: full    Dental  (+) Teeth Intact   Pulmonary neg pulmonary ROS, sleep apnea , former smoker,    Pulmonary exam normal  + decreased breath sounds      Cardiovascular Exercise Tolerance: Poor hypertension, negative cardio ROS Normal cardiovascular exam+ dysrhythmias  Rhythm:Regular Rate:Normal     Neuro/Psych  Headaches, Anxiety Depression negative neurological ROS  negative psych ROS   GI/Hepatic negative GI ROS, Neg liver ROS, GERD  Medicated,  Endo/Other  negative endocrine ROSHypothyroidism   Renal/GU negative Renal ROS  negative genitourinary   Musculoskeletal  (+) Arthritis ,   Abdominal (+) + obese,   Peds negative pediatric ROS (+)  Hematology negative hematology ROS (+) Blood dyscrasia, anemia ,   Anesthesia Other Findings Past Medical History: No date: Anxiety No date: Bariatric surgery status No date: Constipation No date: Dysrhythmia No date: Elevated liver enzymes No date: GERD (gastroesophageal reflux disease) No date: Hemorrhoids No date: Herpes genitalis No date: High cholesterol No date: Hyperlipidemia No date: Hypertension No date: Hypothyroidism No date: Neuropathy No date: Osteoarthritis No date: Sleep apnea  Past Surgical History: No date: ABDOMINAL HYSTERECTOMY     Comment:  total for fibroids no h/o abnormal pap 2015: bariatric sleeve 1998: BREAST EXCISIONAL BIOPSY; Left No date: carpal tunnel repair 02/11/2016: COLONOSCOPY WITH PROPOFOL; N/A     Comment:  Procedure: COLONOSCOPY WITH PROPOFOL;  Surgeon: Jonathon Bellows, MD;  Location: [facility] ENDOSCOPY;  Service: Endoscopy;              Laterality: N/A; 10/01/2019: COLONOSCOPY WITH  PROPOFOL; N/A     Comment:  Procedure: COLONOSCOPY WITH PROPOFOL;  Surgeon: Jonathon Bellows, MD;  Location: [facility] ENDOSCOPY;  Service:               Gastroenterology;  Laterality: N/A; 12/02/2019: ESOPHAGOGASTRODUODENOSCOPY (EGD) WITH PROPOFOL; N/A     Comment:  Procedure: ESOPHAGOGASTRODUODENOSCOPY (EGD) WITH               PROPOFOL;  Surgeon: Jonathon Bellows, MD;  Location: [facility]               ENDOSCOPY;  Service: Gastroenterology;  Laterality: N/A; No date: HEMORRHOID SURGERY  BMI    Body Mass Index: 33.99 kg/m      Reproductive/Obstetrics negative OB ROS                             Anesthesia Physical Anesthesia Plan  ASA: 3  Anesthesia Plan: General   Post-op Pain Management:    Induction:   PONV Risk Score and Plan: Propofol infusion and TIVA  Airway Management Planned: Nasal Cannula  Additional Equipment:   Intra-op Plan:   Post-operative Plan:   Informed Consent: I have reviewed the patients History and Physical, chart, labs and discussed the procedure including the risks, benefits and alternatives for the proposed anesthesia with the patient or authorized representative who has indicated his/her understanding and acceptance.     Dental Advisory Given  Plan Discussed with: CRNA and Surgeon  Anesthesia Plan Comments:  Anesthesia Quick Evaluation  

## 2020-11-20 ENCOUNTER — Encounter: Payer: Self-pay | Admitting: Gastroenterology

## 2020-11-20 DIAGNOSIS — D649 Anemia, unspecified: Secondary | ICD-10-CM | POA: Diagnosis not present

## 2020-11-20 LAB — COMPREHENSIVE METABOLIC PANEL
ALT: 12 U/L (ref 0–44)
AST: 21 U/L (ref 15–41)
Albumin: 3.4 g/dL — ABNORMAL LOW (ref 3.5–5.0)
Alkaline Phosphatase: 57 U/L (ref 38–126)
Anion gap: 7 (ref 5–15)
BUN: 7 mg/dL — ABNORMAL LOW (ref 8–23)
CO2: 24 mmol/L (ref 22–32)
Calcium: 8.5 mg/dL — ABNORMAL LOW (ref 8.9–10.3)
Chloride: 107 mmol/L (ref 98–111)
Creatinine, Ser: 0.69 mg/dL (ref 0.44–1.00)
GFR, Estimated: >60 mL/min (ref >60)
Glucose, Bld: 103 mg/dL — ABNORMAL HIGH (ref 70–99)
Potassium: 3.5 mmol/L (ref 3.5–5.1)
Sodium: 138 mmol/L (ref 135–145)
Total Bilirubin: 0.8 mg/dL (ref 0.3–1.2)
Total Protein: 5.7 g/dL — ABNORMAL LOW (ref 6.5–8.1)

## 2020-11-20 LAB — SURGICAL PATHOLOGY

## 2020-11-20 LAB — CBC WITH DIFFERENTIAL/PLATELET
Abs Immature Granulocytes: 0.03 10*3/uL (ref 0.00–0.07)
Basophils Absolute: 0.1 10*3/uL (ref 0.0–0.1)
Basophils Relative: 1 %
Eosinophils Absolute: 0 10*3/uL (ref 0.0–0.5)
Eosinophils Relative: 0 %
HCT: 29.6 % — ABNORMAL LOW (ref 36.0–46.0)
Hemoglobin: 8.9 g/dL — ABNORMAL LOW (ref 12.0–15.0)
Immature Granulocytes: 0 %
Lymphocytes Relative: 23 %
Lymphs Abs: 2.2 10*3/uL (ref 0.7–4.0)
MCH: 25.4 pg — ABNORMAL LOW (ref 26.0–34.0)
MCHC: 30.1 g/dL (ref 30.0–36.0)
MCV: 84.6 fL (ref 80.0–100.0)
Monocytes Absolute: 0.8 10*3/uL (ref 0.1–1.0)
Monocytes Relative: 8 %
Neutro Abs: 6.7 10*3/uL (ref 1.7–7.7)
Neutrophils Relative %: 68 %
Platelets: 347 10*3/uL (ref 150–400)
RBC: 3.5 MIL/uL — ABNORMAL LOW (ref 3.87–5.11)
RDW: 18.4 % — ABNORMAL HIGH (ref 11.5–15.5)
WBC: 9.8 10*3/uL (ref 4.0–10.5)
nRBC: 0.2 % (ref 0.0–0.2)

## 2020-11-20 LAB — MAGNESIUM: Magnesium: 2.2 mg/dL (ref 1.7–2.4)

## 2020-11-20 LAB — PHOSPHORUS: Phosphorus: 4.1 mg/dL (ref 2.5–4.6)

## 2020-11-20 MED ORDER — SIMETHICONE 80 MG PO CHEW
160.0000 mg | CHEWABLE_TABLET | Freq: Once | ORAL | Status: AC
  Administered 2020-11-20: 160 mg via ORAL
  Filled 2020-11-20: qty 2

## 2020-11-20 NOTE — Plan of Care (Signed)
Continuing with plan of care. 

## 2020-11-20 NOTE — Progress Notes (Signed)
2200: Patient complaining of back, chest pain, and left arm pain, felt like she was having a heart attack. Contacted provider. CBG, EKG, among other others put in and completed. Patient's pain level was relieved.   0510: Patient states pain is gone and requesting more simethicone to relieve more gas. Lozenges given prn for throat irritation.

## 2020-11-20 NOTE — Progress Notes (Signed)
Physician Discharge Summary  Jennifer [name] QIO:[id number] DOB: 1950-09-26 DOA: 11/17/2020  PCP: Jennifer Hawthorne, FNP  Admit date: 11/17/2020 Discharge date: 11/20/2020  Time spent: 40 minutes  Recommendations for Outpatient Follow-up:  Follow outpatient CBC/CMP Follow biopsy with GI outpatient Follow anemia outpatient, iron deficiency Follow h pylori serologies outpatient Reaction to IV ferric gluconate? Added to allergy list  Discharge Diagnoses:  Principal Problem:   Symptomatic anemia Active Problems:   Essential hypertension   Hypothyroidism   GI bleed   Chronic iron deficiency anemia   Discharge Condition: stable  Diet recommendation: heart healthy  Filed Weights   11/17/20 1327  Weight: 81.6 kg    History of present illness:  The patient is Jennifer [name] 70 yr old woman who has been having intermittent BPBPR in the setting of chronic melena due to oral iron supplementation for the past couple of weeks. Initially she was going to have an outpatient colonoscopy with Jennifer [name] on 12/02/2020. However in the last few days she has had progression of shortness of breath and fatigue with exertion. She came to the ED on 11/16/2020 and was found to have Jennifer [name] hgb of 6.9, but left before she could be seen. She returned today at the urging of Jennifer [name] who now wishes to perform her procedure as in patient.   The patient's medical history is significant for anxiety, s/p gastric sleeve with recent "redo", intestinal malabsorption due to bariatric surgery, chronic iron deficiency anemia, GERD, Herpes genitalis, hyperlipidemia, hypertension, hypothyroidism, and neuropathy.   The patient denies fevers, chills, chest pain, nausea, vomiting, constipation, diarrhea. No abdominal pain, cough, sputum production, or joint pain/swellling. No neurological changes, headache, or rashes.    She has been started on protonix and has been given Jennifer [name] transfusion on 1 unit PRBC's. She will be on Jennifer [name] clear liquid diet and  her hemoglobin and hematocrit will be followed.  She's now s/p EGD/colonoscopy without notable findings.  Biopsy pending.  Stable for discharge on 11/5.   See below for additional details  Hospital Course:  Symptomatic anemia  GI bleed Hb 6.8 on 11/1 It's fluctuated widely after 1 unit pRBC Relatively stable today, follow outpatient Labs show iron def anemia - normal b12, folate GI c/s, appreciate recs - planning EGD/colonoscopy tomorrow (as below - without notable findings, surgical anastomosis biopsied) follow pending biopsy results H pylori serology pending PPI BID Now s/p EGD/colonoscopy as noted above   Flank Pain  Pleuritic Chest Discomfort  Left Arm Pain  Bilateral Hand Swelling Sx resolved at this time, EKG without concerning findings, troponins negative x2 No additional w/u at this time with complete resolution of symptoms Started after IV iron - this could be infusion reaction? - added ferric gluconate to allergy list  Chronic iron deficiency anemia Due to intestinal malabsorption related to the patient's gastric sleeve procedure and recent redo of this procedure. She is on oral iron supplements as outpatient. S/p IV ferric gluconate 11/3, ? Reaction as noted above  Essential hypertension Resume hme BP meds   Hypothyroidism Continue levothyroxine as at home.   Procedures: EGD/Colonoscopy Normal esophagus, stomach, widely patent previous surgical anastomosis - biopsied Poor preparation of colon, nonbleeding internal hemorrhoids, otherwise normal examination Follow up with GI in 4 weeks  Consultations: GI  Discharge Exam: Vitals:   11/20/20 0454 11/20/20 0818  BP: 109/69 104/69  Pulse: 60 61  Resp:  20  Temp:  98.6 F (37 C)  SpO2:  94%   No complaints, feeling well Symptoms from  last night resolved Husband at bedside  General: No acute distress. Cardiovascular: Heart sounds show Jennifer [name] regular rate, and rhythm.  Lungs: Clear to auscultation bilaterally  with good air movement. Abdomen: Soft, nontender, nondistended  Neurological: Alert and oriented 3. Moves all extremities 4 . Cranial nerves II through XII grossly intact. Skin: Warm and dry. No rashes or lesions. Extremities: No clubbing or cyanosis. No edema.   Discharge Instructions   Discharge Instructions     Call MD for:  difficulty breathing, headache or visual disturbances   Complete by: As directed    Call MD for:  extreme fatigue   Complete by: As directed    Call MD for:  hives   Complete by: As directed    Call MD for:  persistant dizziness or light-headedness   Complete by: As directed    Call MD for:  persistant nausea and vomiting   Complete by: As directed    Call MD for:  redness, tenderness, or signs of infection (pain, swelling, redness, odor or green/yellow discharge around incision site)   Complete by: As directed    Call MD for:  severe uncontrolled pain   Complete by: As directed    Call MD for:  temperature >100.4   Complete by: As directed    Diet - low sodium heart healthy   Complete by: As directed    Discharge instructions   Complete by: As directed    You were seen for anemia (low blood counts) and iron deficiency.  You had and EGD and colonoscopy with Jennifer [name] which were unrevealing for Jennifer [name] source.  Jennifer [name] took biopsies which should be followed outpatient with him.    Continue protonix daily.  We gave you Jennifer [name] dose of IV iron prior to discharge and we'll discharge you with iron to take by mouth daily.  Follow up with your PCP within Jar[place] week for repeat labs to check your hemoglobin.  You'll need additional workup for your iron deficiency anemia outpatient.  You may have had Jennifer [name] reaction to IV ferric gluconate?  I'm not completely sure, but given the proximity of your symptoms to the infusion and your negative workup, this is probably the most likely cause.  We've added this to your allergy list.  Continue the oral iron.  Return for recurrent  symptoms.  Return for new, recurrent, or worsening symptoms.  Please ask your PCP to request records from this hospitalization so they know what was done and what the next steps will be.   Increase activity slowly   Complete by: As directed       Allergies as of 11/20/2020       Reactions   Celecoxib Hives   Hydrocodone Nausea Only   Pollen Extract    Sodium Ferric Gluconate [ferrous Gluconate]    IV ferric gluconate - shortly after the infusion developed back pain shooting into left arm and bilateral hand swelling   Nasacort [triamcinolone] Other (See Comments)   Nasal - Nose Bleeds   Vicodin [hydrocodone-acetaminophen] Nausea Only        Medication List     STOP taking these medications    famotidine 20 MG tablet Commonly known as: PEPCID       TAKE these medications    acetaminophen 325 MG tablet Commonly known as: TYLENOL Take 2 tablets (650 mg total) by mouth every 6 (six) hours as needed for mild pain (or Fever >/= 101). What changed: how much to take   acyclovir 400  MG tablet Commonly known as: ZOVIRAX Take 400 mg by mouth daily.   calcium citrate 950 (200 Ca) MG tablet Commonly known as: CALCITRATE - dosed in mg elemental calcium Take 200 mg of elemental calcium by mouth daily.   cetirizine 10 MG tablet Commonly known as: ZYRTEC Take 10 mg by mouth daily.   ferrous sulfate 325 (65 FE) MG tablet Take 1 tablet (325 mg total) by mouth daily with breakfast.   fluticasone 50 MCG/ACT nasal spray Commonly known as: FLONASE Place 1 spray into both nostrils daily.   gabapentin 300 MG capsule Commonly known as: NEURONTIN Take 1 capsule (300 mg total) by mouth 2 (two) times daily.   hydrocortisone 25 MG suppository Commonly known as: ANUSOL-HC Place 1 suppository (25 mg total) rectally 2 (two) times daily.   hydrocortisone cream 1 % Apply 1 application topically 2 (two) times daily.   levothyroxine 50 MCG tablet Commonly known as: SYNTHROID TAKE  1 TABLET BY MOUTH EVERY DAY   losartan 100 MG tablet Commonly known as: COZAAR TAKE 1 TABLET BY MOUTH EVERY DAY AT BEDTIME   multivitamin with minerals Tabs tablet Take 1 tablet by mouth daily.   pantoprazole 40 MG tablet Commonly known as: Protonix Take 1 tablet (40 mg total) by mouth daily.   sertraline 100 MG tablet Commonly known as: ZOLOFT Take 1 tablet (100 mg total) by mouth at bedtime.   triamterene-hydrochlorothiazide 37.5-25 MG tablet Commonly known as: MAXZIDE-25 Take 1 tablet by mouth daily.        Allergies  Allergen Reactions   Celecoxib Hives   Hydrocodone Nausea Only   Pollen Extract    Sodium Ferric Gluconate [Ferrous Gluconate]     IV ferric gluconate - shortly after the infusion developed back pain shooting into left arm and bilateral hand swelling   Nasacort [Triamcinolone] Other (See Comments)    Nasal - Nose Bleeds   Vicodin [Hydrocodone-Acetaminophen] Nausea Only      The results of significant diagnostics from this hospitalization (including imaging, microbiology, ancillary and laboratory) are listed below for reference.    Significant Diagnostic Studies: DG Chest 2 View  Result Date: 11/16/2020 CLINICAL DATA:  Shortness of breath. EXAM: CHEST - 2 VIEW COMPARISON:  06/09/2020 FINDINGS: The heart size and mediastinal contours are within normal limits. Both lungs are clear. The visualized skeletal structures are unremarkable. IMPRESSION: No active cardiopulmonary disease. Electronically Signed   By: Markus Daft M.D.   On: 11/16/2020 10:50    Microbiology: Recent Results (from the past 240 hour(s))  Resp Panel by RT-PCR (Flu Meet [place]&B, Covid) Nasopharyngeal Swab     Status: None   Collection Time: 11/17/20  5:01 PM   Specimen: Nasopharyngeal Swab; Nasopharyngeal(NP) swabs in vial transport medium  Result Value Ref Range Status   SARS Coronavirus 2 by RT PCR NEGATIVE NEGATIVE Final    Comment: (NOTE) SARS-CoV-2 target nucleic acids are NOT  DETECTED.  The SARS-CoV-2 RNA is generally detectable in upper respiratory specimens during the acute phase of infection. The lowest concentration of SARS-CoV-2 viral copies this assay can detect is 138 copies/mL. Lyndy Russman negative result does not preclude SARS-Cov-2 infection and should not be used as the sole basis for treatment or other patient management decisions. Brinleigh Tew negative result may occur with  improper specimen collection/handling, submission of specimen other than nasopharyngeal swab, presence of viral mutation(s) within the areas targeted by this assay, and inadequate number of viral copies(<138 copies/mL). Mckenzey Parcell negative result must be combined with clinical observations, patient history,  and epidemiological information. The expected result is Negative.  Fact Sheet for Patients:  EntrepreneurPulse.com.au  Fact Sheet for Healthcare Providers:  IncredibleEmployment.be  This test is no t yet approved or cleared by the [place] FDA and  has been authorized for detection and/or diagnosis of SARS-CoV-2 by FDA under an Emergency Use Authorization (EUA). This EUA will remain  in effect (meaning this test can be used) for the duration of the COVID-19 declaration under Section 564(b)(1) of the Act, 21 U.S.C.section 360bbb-3(b)(1), unless the authorization is terminated  or revoked sooner.       Influenza Kriss Ishler by PCR NEGATIVE NEGATIVE Final   Influenza B by PCR NEGATIVE NEGATIVE Final    Comment: (NOTE) The Xpert Xpress SARS-CoV-2/FLU/RSV plus assay is intended as an aid in the diagnosis of influenza from Nasopharyngeal swab specimens and should not be used as Alayza Pieper sole basis for treatment. Nasal washings and aspirates are unacceptable for Xpert Xpress SARS-CoV-2/FLU/RSV testing.  Fact Sheet for Patients: EntrepreneurPulse.com.au  Fact Sheet for Healthcare Providers: IncredibleEmployment.be  This test is not yet  approved or cleared by the [place] FDA and has been authorized for detection and/or diagnosis of SARS-CoV-2 by FDA under an Emergency Use Authorization (EUA). This EUA will remain in effect (meaning this test can be used) for the duration of the COVID-19 declaration under Section 564(b)(1) of the Act, 21 U.S.C. section 360bbb-3(b)(1), unless the authorization is terminated or revoked.  Performed at [facility], [address] [place], [place] [address]      Labs: Basic Metabolic Panel: Recent Labs  Lab 11/17/20 1330 11/18/20 0450 11/19/20 0418 11/19/20 2045 11/20/20 0400  NA 138 140 142 140 138  K 3.7 3.3* 3.7 3.9 3.5  CL 101 104 108 110 107  CO2 26 30 29 23 24   GLUCOSE 118* 117* 103* 111* 103*  BUN 10 9 5* 6* 7*  CREATININE 0.68 0.72 0.66 0.69 0.69  CALCIUM 9.1 8.8* 8.9 8.3* 8.5*  MG  --   --  2.2  --  2.2  PHOS  --   --  3.5  --  4.1   Liver Function Tests: Recent Labs  Lab 11/18/20 0450 11/19/20 0418 11/19/20 2045 11/20/20 0400  AST 28 23 24 21   ALT 17 15 14 12   ALKPHOS 68 65 60 57  BILITOT 0.9 0.9 0.8 0.8  PROT 6.3* 6.0* 5.6* 5.7*  ALBUMIN 3.7 3.6 3.0* 3.4*   No results for input(s): LIPASE, AMYLASE in the last 168 hours. No results for input(s): AMMONIA in the last 168 hours. CBC: Recent Labs  Lab 11/16/20 1025 11/17/20 1330 11/17/20 1842 11/18/20 0450 11/18/20 1955 11/19/20 0418 11/19/20 1439 11/19/20 2045 11/20/20 0400  WBC 6.6 5.8  --  6.9  --  6.7  --   --  9.8  NEUTROABS  --  2.7  --   --   --  3.5  --   --  6.7  HGB 6.9* 7.4*   < > 8.3* 8.8* 7.8* 7.6* 10.5* 8.9*  HCT 22.7* 23.7*   < > 26.3* 28.8* 25.4* 25.7* 34.7* 29.6*  MCV 84.4 82.6  --  83.0  --  84.9  --   --  84.6  PLT 297 349  --  317  --  303  --   --  347   < > = values in this interval not displayed.   Cardiac Enzymes: No results for input(s): CKTOTAL, CKMB, CKMBINDEX, TROPONINI in the last 168 hours. BNP:  BNP (last 3 results) No results for input(s): BNP  in the last 8760 hours.  ProBNP (last 3 results) No results for input(s): PROBNP in the last 8760 hours.  CBG: Recent Labs  Lab 11/19/20 2011  GLUCAP 119*       Signed:  Fayrene Helper MD.  [facility] 11/20/2020, 10:14 AM

## 2020-11-20 NOTE — Plan of Care (Signed)
Discharge teaching completed with patient who is in stable condition. 

## 2020-11-20 NOTE — Care Management Important Message (Signed)
Important Message  Patient Details  Name: Jennifer [name] MRN: [id number] Date of Birth: 09-25-1950   Medicare Important Message Given:  Yes     [name] 11/20/2020, 10:56 AM

## 2020-11-22 ENCOUNTER — Other Ambulatory Visit: Payer: Self-pay | Admitting: Family

## 2020-11-23 ENCOUNTER — Other Ambulatory Visit: Payer: Self-pay | Admitting: Family

## 2020-11-23 LAB — H PYLORI, IGM, IGG, IGA AB
H Pylori IgG: 1.37 Index Value — ABNORMAL HIGH (ref 0.00–0.79)
H. Pylogi, Iga Abs: <9 units (ref 0.0–8.9)
H. Pylogi, Igm Abs: <9 units (ref 0.0–8.9)

## 2020-11-24 ENCOUNTER — Telehealth: Payer: Self-pay | Admitting: *Deleted

## 2020-11-24 NOTE — Telephone Encounter (Signed)
Transition Care Management Unsuccessful Follow-up Telephone Call  Date of discharge and from where:  11/20/20 [facility]  Attempts:  1st Attempt  Reason for unsuccessful TCM follow-up call:  Left voice message   Kelli Churn RN, CCM, [place] Management Coordinator - Managed [place] [facility] [phone]

## 2020-11-25 ENCOUNTER — Telehealth: Payer: Self-pay | Admitting: Family

## 2020-11-25 ENCOUNTER — Ambulatory Visit (INDEPENDENT_AMBULATORY_CARE_PROVIDER_SITE_OTHER): Payer: Medicare Other | Admitting: Adult Health

## 2020-11-25 ENCOUNTER — Telehealth: Payer: Self-pay

## 2020-11-25 ENCOUNTER — Encounter: Payer: Self-pay | Admitting: Adult Health

## 2020-11-25 ENCOUNTER — Other Ambulatory Visit: Payer: Self-pay

## 2020-11-25 VITALS — BP 118/68 | HR 65 | Temp 96.2°F | Ht 60.98 in | Wt 182.6 lb

## 2020-11-25 DIAGNOSIS — F419 Anxiety disorder, unspecified: Secondary | ICD-10-CM | POA: Insufficient documentation

## 2020-11-25 DIAGNOSIS — H1013 Acute atopic conjunctivitis, bilateral: Secondary | ICD-10-CM

## 2020-11-25 DIAGNOSIS — Z9989 Dependence on other enabling machines and devices: Secondary | ICD-10-CM | POA: Insufficient documentation

## 2020-11-25 DIAGNOSIS — J309 Allergic rhinitis, unspecified: Secondary | ICD-10-CM | POA: Diagnosis not present

## 2020-11-25 DIAGNOSIS — H04203 Unspecified epiphora, bilateral lacrimal glands: Secondary | ICD-10-CM | POA: Diagnosis not present

## 2020-11-25 DIAGNOSIS — D649 Anemia, unspecified: Secondary | ICD-10-CM | POA: Diagnosis not present

## 2020-11-25 DIAGNOSIS — F32A Depression, unspecified: Secondary | ICD-10-CM | POA: Insufficient documentation

## 2020-11-25 DIAGNOSIS — J329 Chronic sinusitis, unspecified: Secondary | ICD-10-CM | POA: Insufficient documentation

## 2020-11-25 MED ORDER — OLOPATADINE HCL 0.1 % OP SOLN: 1.0000 [drp] | Freq: Two times a day (BID) | OPHTHALMIC | 12 refills | Status: DC

## 2020-11-25 MED ORDER — LEVOCETIRIZINE DIHYDROCHLORIDE 5 MG PO TABS: 2.5000 mg | ORAL_TABLET | Freq: Every evening | ORAL | 0 refills | Status: DC

## 2020-11-25 NOTE — Telephone Encounter (Signed)
Pt scheduled today with you FYI. She stated that she started having very runny, itchy eyes since last week. She has been using eye drops, taking antihistamines with no relief. She said that she was miserable & using lots of tissues for her eyes. Pt denies any cough, congestion nasal or chest. Only symptom is her eye drainage. She said there was no pus, they are not matted & no pain.

## 2020-11-25 NOTE — Telephone Encounter (Signed)
Pt called in regards to her allergies. Pt says she is having drainage from her eyes and they're irritable from being itchy. Pt has been taking zyrtec and something similar to claritin however, the medications are not working. Pt wants to know if she could be prescribed something for this?  [phone]

## 2020-11-25 NOTE — Progress Notes (Signed)
Acute Office Visit  Subjective:    Patient ID: Jennifer [name], female    DOB: July 08, 1950, 70 y.o.   MRN: [id number]  Chief Complaint  Patient presents with   Eye Drainage    Pt has eye drainage,itchy eyes and redness . Has been going on for about a week    HPI Patient is in today for runny nose, itchy watery bilateral eyes. She has tried zyrtec and Sustane and Balance eye drops  as well. She reports she has this every year. Denies any eye pain or vision change. Denies any eye injury. Itching bilateral eyes. Watery clear drainage from bilateral eyes, no color to drainage per patient.  She was taking allegra and it was not helping.  She was hospitalized for iron deficiency had blood transfusion and is scheduled in two weeks for follow up with hematology   Denies any chest congestion or cough, onset one week ago.   Patient  denies any fever, body aches,chills, rash, chest pain, shortness of breath, nausea, vomiting, or diarrhea.      Past Medical History:  Diagnosis Date   Anxiety    Bariatric surgery status    Constipation    Dysrhythmia    Elevated liver enzymes    GERD (gastroesophageal reflux disease)    Hemorrhoids    Herpes genitalis    High cholesterol    Hyperlipidemia    Hypertension    Hypothyroidism    Neuropathy    Osteoarthritis    Sleep apnea     Past Surgical History:  Procedure Laterality Date   ABDOMINAL HYSTERECTOMY     total for fibroids no h/o abnormal pap   bariatric sleeve  2015   BREAST EXCISIONAL BIOPSY Left 1998   carpal tunnel repair     COLONOSCOPY WITH PROPOFOL N/A 02/11/2016   Procedure: COLONOSCOPY WITH PROPOFOL;  Surgeon: Jonathon Bellows, MD;  Location: [facility] ENDOSCOPY;  Service: Endoscopy;  Laterality: N/A;   COLONOSCOPY WITH PROPOFOL N/A 10/01/2019   Procedure: COLONOSCOPY WITH PROPOFOL;  Surgeon: Jonathon Bellows, MD;  Location: [facility] ENDOSCOPY;  Service: Gastroenterology;  Laterality: N/A;   COLONOSCOPY WITH PROPOFOL N/A 11/19/2020    Procedure: COLONOSCOPY WITH PROPOFOL;  Surgeon: Jonathon Bellows, MD;  Location: [facility] ENDOSCOPY;  Service: Gastroenterology;  Laterality: N/A;   ESOPHAGOGASTRODUODENOSCOPY (EGD) WITH PROPOFOL N/A 12/02/2019   Procedure: ESOPHAGOGASTRODUODENOSCOPY (EGD) WITH PROPOFOL;  Surgeon: Jonathon Bellows, MD;  Location: [facility] ENDOSCOPY;  Service: Gastroenterology;  Laterality: N/A;   ESOPHAGOGASTRODUODENOSCOPY (EGD) WITH PROPOFOL N/A 11/19/2020   Procedure: ESOPHAGOGASTRODUODENOSCOPY (EGD) WITH PROPOFOL;  Surgeon: Jonathon Bellows, MD;  Location: [facility] ENDOSCOPY;  Service: Gastroenterology;  Laterality: N/A;   HEMORRHOID SURGERY      Family History  Problem Relation Age of Onset   Breast cancer Sister 37       materal 1/2 sister   Hypertension Sister    Hypertension Mother    Heart disease Father    Hypertension Brother     Social History   Socioeconomic History   Marital status: Married    Spouse name: Not on file   Number of children: Not on file   Years of education: Not on file   Highest education level: Not on file  Occupational History   Not on file  Tobacco Use   Smoking status: Former    Packs/day: 1.50    Years: 24.00    Pack years: 36.00    Types: Cigarettes    Quit date: 1993    Years since quitting: 29.8  Smokeless tobacco: Never   Tobacco comments:    quit 1995.   Vaping Use   Vaping Use: Never used  Substance and Sexual Activity   Alcohol use: Not Currently   Drug use: No   Sexual activity: Not Currently    Birth control/protection: Surgical    Comment: Hysterectomy  Other Topics Concern   Not on file  Social History Narrative   Lives in [place].    Married.    Retired 2015, Interior and spatial designer.    One son; granddaughter.    Left handed    Caffeine- decaf coffee.    Social Determinants of Health   Financial Resource Strain: Low Risk    Difficulty of Paying Living Expenses: Not hard at all  Food Insecurity: No Food Insecurity   Worried About Charity fundraiser in  the Last Year: Never true   [place] in the Last Year: Never true  Transportation Needs: No Transportation Needs   Lack of Transportation (Medical): No   Lack of Transportation (Non-Medical): No  Physical Activity: Not on file  Stress: No Stress Concern Present   Feeling of Stress : Not at all  Social Connections: Unknown   Frequency of Communication with Friends and Family: More than three times a week   Frequency of Social Gatherings with Friends and Family: More than three times a week   Attends Religious Services: Not on Electrical engineer or Organizations: Not on file   Attends Archivist Meetings: Not on file   Marital Status: Not on file  Intimate Partner Violence: Not At Risk   Fear of Current or Ex-Partner: No   Emotionally Abused: No   Physically Abused: No   Sexually Abused: No    Outpatient Medications Prior to Visit  Medication Sig Dispense Refill   acetaminophen (TYLENOL) 325 MG tablet Take 2 tablets (650 mg total) by mouth every 6 (six) hours as needed for mild pain (or Fever >/= 101). (Patient taking differently: Take 2,000 mg by mouth every 6 (six) hours as needed for mild pain (or Fever >/= 101).)     acyclovir (ZOVIRAX) 400 MG tablet Take 400 mg by mouth daily.     calcium citrate (CALCITRATE - DOSED IN MG ELEMENTAL CALCIUM) 950 (200 Ca) MG tablet Take 200 mg of elemental calcium by mouth daily.     ferrous sulfate 325 (65 FE) MG tablet Take 1 tablet (325 mg total) by mouth daily with breakfast. 30 tablet 1   fluticasone (FLONASE) 50 MCG/ACT nasal spray Place 1 spray into both nostrils daily.     gabapentin (NEURONTIN) 300 MG capsule Take 1 capsule (300 mg total) by mouth 2 (two) times daily. 120 capsule 2   hydrocortisone (ANUSOL-HC) 25 MG suppository Place 1 suppository (25 mg total) rectally 2 (two) times daily. 12 suppository 1   levothyroxine (SYNTHROID) 50 MCG tablet TAKE 1 TABLET BY MOUTH EVERY DAY 90 tablet 1   losartan (COZAAR)  100 MG tablet TAKE 1 TABLET BY MOUTH EVERY DAY AT BEDTIME 90 tablet 0   Multiple Vitamin (MULTIVITAMIN WITH MINERALS) TABS tablet Take 1 tablet by mouth daily.     pantoprazole (PROTONIX) 40 MG tablet Take 1 tablet (40 mg total) by mouth daily. 30 tablet 1   sertraline (ZOLOFT) 100 MG tablet Take 1 tablet (100 mg total) by mouth at bedtime. 90 tablet 1   triamterene-hydrochlorothiazide (MAXZIDE-25) 37.5-25 MG tablet Take 1 tablet by mouth daily.  cetirizine (ZYRTEC) 10 MG tablet Take 10 mg by mouth daily.     hydrocortisone cream 1 % Apply 1 application topically 2 (two) times daily. (Patient not taking: Reported on 11/25/2020)     traZODone (DESYREL) 100 MG tablet Take 100 mg by mouth at bedtime. (Patient not taking: Reported on 11/25/2020)     No facility-administered medications prior to visit.    Allergies  Allergen Reactions   Celecoxib Hives   Hydrocodone Nausea Only   Pollen Extract    Sodium Ferric Gluconate [Ferrous Gluconate]     IV ferric gluconate - shortly after the infusion developed back pain shooting into left arm and bilateral hand swelling   Nasacort [Triamcinolone] Other (See Comments)    Nasal - Nose Bleeds   Vicodin [Hydrocodone-Acetaminophen] Nausea Only    Review of Systems  Constitutional: Negative.   HENT:  Positive for postnasal drip and rhinorrhea. Negative for congestion, dental problem, drooling, ear discharge, ear pain, facial swelling, hearing loss, mouth sores, nosebleeds, sinus pressure, sinus pain, sneezing, sore throat, tinnitus, trouble swallowing and voice change.   Eyes:  Positive for discharge and itching. Negative for photophobia, pain, redness and visual disturbance.       Bilateral clear discharge   Respiratory: Negative.    Cardiovascular: Negative.   Gastrointestinal: Negative.   Skin: Negative.   Psychiatric/Behavioral: Negative.        Objective:    Physical Exam Vitals reviewed.  Constitutional:      General: She is not in acute  distress.    Appearance: She is normal weight. She is not ill-appearing, toxic-appearing or diaphoretic.  HENT:     Head: Normocephalic and atraumatic.     Right Ear: Tympanic membrane, ear canal and external ear normal.     Left Ear: Tympanic membrane, ear canal and external ear normal.     Nose: Congestion and rhinorrhea present.     Mouth/Throat:     Comments:  Cobblestoning posterior pharynx; bilateral allergic shiners; bilateral TMs air fluid level clear; bilateral nasal turbinates mild edema erythema clear discharge;    Eyes:     General: Lids are normal. Lids are everted, no foreign bodies appreciated. Vision grossly intact. Gaze aligned appropriately. Allergic shiner present. No visual field deficit or scleral icterus.       Right eye: Discharge (bilateral watery discharge, visual acuity within normal limits,) present. No foreign body or hordeolum.        Left eye: Discharge present.No foreign body or hordeolum.     Extraocular Movements: Extraocular movements intact.     Right eye: Normal extraocular motion and no nystagmus.     Left eye: Normal extraocular motion and no nystagmus.     Conjunctiva/sclera:     Right eye: Right conjunctiva is not injected. No chemosis, exudate or hemorrhage.    Left eye: Left conjunctiva is not injected. No chemosis, exudate or hemorrhage.    Pupils: Pupils are equal, round, and reactive to light.  Cardiovascular:     Rate and Rhythm: Normal rate and regular rhythm.     Heart sounds: Normal heart sounds.    No gallop.  Pulmonary:     Effort: Pulmonary effort is normal. No respiratory distress.     Breath sounds: Normal breath sounds. No stridor. No rhonchi or rales.  Chest:     Chest wall: No tenderness.  Abdominal:     General: Abdomen is flat. Bowel sounds are normal. There is no distension.     Palpations: Abdomen  is soft.     Tenderness: There is no abdominal tenderness. There is no guarding.  Musculoskeletal:     Cervical back: Normal  range of motion and neck supple. No rigidity.  Lymphadenopathy:     Cervical: No cervical adenopathy.  Neurological:     Mental Status: She is alert.    BP 118/68   Pulse 65   Temp (!) 96.2 F (35.7 C)   Ht 5' 0.98" (1.549 m)   Wt 182 lb 9.6 oz (82.8 kg)   SpO2 97%   BMI 34.52 kg/m  Wt Readings from Last 3 Encounters:  11/25/20 182 lb 9.6 oz (82.8 kg)  11/17/20 179 lb 14.3 oz (81.6 kg)  11/16/20 180 lb (81.6 kg)    There are no preventive care reminders to display for this patient.  There are no preventive care reminders to display for this patient.   Lab Results  Component Value Date   TSH 0.916 10/14/2020   Lab Results  Component Value Date   WBC 9.8 11/20/2020   HGB 8.9 (L) 11/20/2020   HCT 29.6 (L) 11/20/2020   MCV 84.6 11/20/2020   PLT 347 11/20/2020   Lab Results  Component Value Date   NA 138 11/20/2020   K 3.5 11/20/2020   CO2 24 11/20/2020   GLUCOSE 103 (H) 11/20/2020   BUN 7 (L) 11/20/2020   CREATININE 0.69 11/20/2020   BILITOT 0.8 11/20/2020   ALKPHOS 57 11/20/2020   AST 21 11/20/2020   ALT 12 11/20/2020   PROT 5.7 (L) 11/20/2020   ALBUMIN 3.4 (L) 11/20/2020   CALCIUM 8.5 (L) 11/20/2020   ANIONGAP 7 11/20/2020   EGFR 72 10/14/2020   GFR 58.38 (L) 05/15/2020   Lab Results  Component Value Date   CHOL 174 03/10/2020   Lab Results  Component Value Date   HDL 52.90 03/10/2020   Lab Results  Component Value Date   LDLCALC 98 03/10/2020   Lab Results  Component Value Date   TRIG 114.0 03/10/2020   Lab Results  Component Value Date   CHOLHDL 3 03/10/2020   Lab Results  Component Value Date   HGBA1C 5.1 10/14/2020       Assessment & Plan:   Problem List Items Addressed This Visit       Respiratory   Allergic conjunctivitis of both eyes and rhinitis     Other   Anemia   Relevant Orders   CBC with Differential/Platelet   Watery eyes - Primary   Relevant Orders   Visual acuity screening   CBC with Differential/Platelet    Comprehensive metabolic panel   Medications Discontinued During This Encounter  Medication Reason   cetirizine (ZYRTEC) 10 MG tablet Completed Course     Meds ordered this encounter  Medications   olopatadine (PATADAY) 0.1 % ophthalmic solution    Sig: Place 1 drop into both eyes 2 (two) times daily.    Dispense:  5 mL    Refill:  12   levocetirizine (XYZAL ALLERGY 24HR) 5 MG tablet    Sig: Take 0.5 tablets (2.5 mg total) by mouth every evening.    Dispense:  30 tablet    Refill:  0  Advised to continue using rewetting drops.Shower/ rinse off allergens nightly.  Keep follow up with hematology.  Stop zyrtec start xyzal as directed.  RED flags and strict return precautions advised.  Provider did recommend covid/rsv / flu testing today given rhinorrhea and recent hospitalization however patient declined.   Advised  to see opthalmology or be seen in ER if after hours and symptoms persisting with treatment or any red flags occur.   Return in about 1 week (around 12/02/2020), or if symptoms worsen or fail to improve, for Go to Emergency room/ urgent care if worse, at any time for any worsening symptoms. See opthalmology.    Marcille Buffy, FNP

## 2020-11-25 NOTE — Telephone Encounter (Signed)
Placed call to pt to switch to virtual appt per provider request due to itchy eyes and drainage being symptoms of Covid. LMTCB

## 2020-11-25 NOTE — Patient Instructions (Addendum)
RECOMMEND FOLLOW UP WITH EYE DOCTOR IF NO IMPROVEMENT OR ANY SYMPTOMS WORSEN AT ANYTIME PATIENT IS ESTABLISHED AT [name].   Allergic Conjunctivitis, Adult Allergic conjunctivitis is inflammation of the clear membrane (conjunctiva) that covers the white part of your eye and the inner surface of your eyelid. This condition can make your eye red or pink. It can also make your eye feel itchy. This condition cannot be spread from one person to another person (is not contagious). What are the causes? This condition is caused by allergens. These are things that can cause an allergic reaction in some people but not in others. Common allergens include: Outdoor allergens, such as: Pollen, including pollen from grass and weeds. Mold. Car fumes. Indoor allergens, such as: Dust. Smoke. Mold. Proteins in a pet's pee (urine), saliva, or dander. What increases the risk? You are more likely to develop this condition if you have a family history of these things: Allergies. Conditions that you get because of allergens, such as asthma or inflammation of the skin (eczema). What are the signs or symptoms? Symptoms of this condition include eyes that are: Itchy. Red. Watery. Puffy. Your eyes may also: Sting or burn. Have clear fluid draining from them. Have thick mucus coming from them. How is this treated? This condition may be treated with: Cold, wet cloths (cold compresses) to soothe itching and swelling. Washing the face to remove allergens. Eye drops. These may include: Eye drops that block allergies. Eye drops that reduce swelling and irritation. Steroid eye drops if other treatments have not worked. Oral antihistamine medicines. These medicines lessen your allergies. You may need these if eye drops do not help or are difficult to use. Follow these instructions at home: Eye care Place a cool, clean washcloth on your eye for 10-20 minutes. Do this 3-4 times a day. Do not touch  or rub your eyes. Do not wear contact lenses until the inflammation is gone. Wear glasses instead. Do not wear eye makeup until the inflammation is gone. General instructions Try not to be around things that you are allergic to. Take or apply over-the-counter and prescription medicines only as told by your doctor. These include any eye drops. Drink enough fluid to keep your pee pale yellow. Keep all follow-up visits as told by your doctor. This is important. Contact a doctor if: Your symptoms get worse. Your symptoms do not get better with treatment. You have mild eye pain. You are sensitive to light. You have spots or blisters on your eyes. You have pus coming from your eye. You have a fever. Get help right away if: You have redness, swelling, or other symptoms in only one eye. You cannot see well. You have other vision changes. You have very bad eye pain. Summary Allergic conjunctivitis is caused by allergens. It can make your eye red or pink, and it can make your eye feel itchy. This condition cannot be spread from one person to another person (is not contagious). Try not to be around things that you are allergic to. Take or apply over-the-counter and prescription medicines only as told by your doctor. These include any eye drops. Contact your doctor if your symptoms get worse or they do not get better with treatment. This information is not intended to replace advice given to you by your health care provider. Make sure you discuss any questions you have with your health care provider. Document Revised: 11/26/2018 Document Reviewed: 11/26/2018 Elsevier Patient Education  2022 [employer].

## 2020-11-26 ENCOUNTER — Telehealth: Payer: Self-pay

## 2020-11-26 ENCOUNTER — Encounter: Payer: Self-pay | Admitting: Gastroenterology

## 2020-11-26 LAB — COMPREHENSIVE METABOLIC PANEL
ALT: 14 U/L (ref 0–35)
AST: 27 U/L (ref 0–37)
Albumin: 4.3 g/dL (ref 3.5–5.2)
Alkaline Phosphatase: 80 U/L (ref 39–117)
BUN: 13 mg/dL (ref 6–23)
CO2: 26 mEq/L (ref 19–32)
Calcium: 9.1 mg/dL (ref 8.4–10.5)
Chloride: 104 mEq/L (ref 96–112)
Creatinine, Ser: 0.81 mg/dL (ref 0.40–1.20)
GFR: 73.1 mL/min (ref >60.00)
Glucose, Bld: 88 mg/dL (ref 70–99)
Potassium: 4.4 mEq/L (ref 3.5–5.1)
Sodium: 139 mEq/L (ref 135–145)
Total Bilirubin: 0.3 mg/dL (ref 0.2–1.2)
Total Protein: 6.6 g/dL (ref 6.0–8.3)

## 2020-11-26 LAB — CBC WITH DIFFERENTIAL/PLATELET
Basophils Absolute: 0.1 10*3/uL (ref 0.0–0.1)
Basophils Relative: 0.8 % (ref 0.0–3.0)
Eosinophils Absolute: 0 10*3/uL (ref 0.0–0.7)
Eosinophils Relative: 0.7 % (ref 0.0–5.0)
HCT: 29.8 % — ABNORMAL LOW (ref 36.0–46.0)
Hemoglobin: 9.2 g/dL — ABNORMAL LOW (ref 12.0–15.0)
Lymphocytes Relative: 48.9 % — ABNORMAL HIGH (ref 12.0–46.0)
Lymphs Abs: 3 10*3/uL (ref 0.7–4.0)
MCHC: 30.9 g/dL (ref 30.0–36.0)
MCV: 81.8 fl (ref 78.0–100.0)
Monocytes Absolute: 0.4 10*3/uL (ref 0.1–1.0)
Monocytes Relative: 5.7 % (ref 3.0–12.0)
Neutro Abs: 2.7 10*3/uL (ref 1.4–7.7)
Neutrophils Relative %: 43.9 % (ref 43.0–77.0)
Platelets: 331 10*3/uL (ref 150.0–400.0)
RBC: 3.65 Mil/uL — ABNORMAL LOW (ref 3.87–5.11)
RDW: 22.6 % — ABNORMAL HIGH (ref 11.5–15.5)
WBC: 6.2 10*3/uL (ref 4.0–10.5)

## 2020-11-26 NOTE — Progress Notes (Signed)
CBC shows anemia still which is suspected she should keep her follow up with specialist gastroenterology 12/02/20 she has scheduled in two weeks for recheck. CMP is improved, within normal limits. She is post transfusion. See ophthalmology if eyes not improving with treatment at anytime is advised.

## 2020-11-26 NOTE — Telephone Encounter (Signed)
LMTCB for lab results.  

## 2020-11-26 NOTE — Progress Notes (Signed)
Inform H pylori positive :  Suggest clarithromycin 500 mg PO BID, amoxicillin 1 gram BID, omeprazole 20 mg BID all for 14 days.  , will need repeat H pylori stool antigen to check for eradication after .

## 2020-11-27 NOTE — Telephone Encounter (Signed)
Patient called saw results and physician message on [place].

## 2020-11-30 ENCOUNTER — Telehealth: Payer: Self-pay | Admitting: *Deleted

## 2020-11-30 NOTE — Telephone Encounter (Signed)
Transition Care Management Unsuccessful Follow-up Telephone Call  Date of discharge and from where:  11/20/20 [facility]  Attempts:  2nd Attempt  Reason for unsuccessful TCM follow-up call:  Left voice message    Kelli Churn RN, CCM, [place] Management Coordinator - Managed [place] High Risk [phone]

## 2020-12-01 ENCOUNTER — Telehealth: Payer: Self-pay | Admitting: Gastroenterology

## 2020-12-01 NOTE — Telephone Encounter (Signed)
LVM for patient to call our office to reschedule appt..(Provider has to fly out of town for an emergency)

## 2020-12-02 ENCOUNTER — Ambulatory Visit: Payer: Medicare Other | Admitting: Gastroenterology

## 2020-12-08 ENCOUNTER — Telehealth: Payer: Self-pay

## 2020-12-08 DIAGNOSIS — A048 Other specified bacterial intestinal infections: Secondary | ICD-10-CM

## 2020-12-08 MED ORDER — CLARITHROMYCIN 500 MG PO TABS: 500.0000 mg | ORAL_TABLET | Freq: Two times a day (BID) | ORAL | 0 refills | Status: AC

## 2020-12-08 MED ORDER — OMEPRAZOLE 20 MG PO CPDR: 20.0000 mg | DELAYED_RELEASE_CAPSULE | Freq: Two times a day (BID) | ORAL | 0 refills | Status: DC

## 2020-12-08 MED ORDER — AMOXICILLIN 500 MG PO CAPS: 500.0000 mg | ORAL_CAPSULE | Freq: Three times a day (TID) | ORAL | 0 refills | Status: AC

## 2020-12-08 NOTE — Telephone Encounter (Signed)
Patient was called but had to leave her a detailed message letting her know what Dr. [name] recommended. I will also send her a patient message with the below information.

## 2020-12-08 NOTE — Telephone Encounter (Signed)
Patient called back and [facility] scheduled her appointment to come back to see Dr. [name] on 12/21/2020.

## 2020-12-08 NOTE — Telephone Encounter (Signed)
-----   Message from Jonathon Bellows, MD sent at 11/26/2020 12:24 PM EST ----- Inform H pylori positive :  Suggest clarithromycin 500 mg PO BID, amoxicillin 1 gram BID, omeprazole 20 mg BID all for 14 days.  , will need repeat H pylori stool antigen to check for eradication after .

## 2020-12-17 NOTE — Anesthesia Postprocedure Evaluation (Signed)
Anesthesia Post Note  Patient: Jennifer scientist (life sciences)  Procedure(s) Performed: ESOPHAGOGASTRODUODENOSCOPY (EGD) WITH PROPOFOL COLONOSCOPY WITH PROPOFOL  Patient location during evaluation: PACU Anesthesia Type: General Level of consciousness: awake Pain management: pain level controlled Vital Signs Assessment: post-procedure vital signs reviewed and stable Respiratory status: spontaneous breathing Cardiovascular status: blood pressure returned to baseline Postop Assessment: no headache Anesthetic complications: no   No notable events documented.   Last Vitals:  Vitals:   11/20/20 0454 11/20/20 0818  BP: 109/69 104/69  Pulse: 60 61  Resp:  20  Temp:  37 C  SpO2:  94%    Last Pain:  Vitals:   11/20/20 1000  TempSrc:   PainSc: 0-No pain                 VAN STAVEREN,Jennifer [name]

## 2020-12-21 ENCOUNTER — Ambulatory Visit (INDEPENDENT_AMBULATORY_CARE_PROVIDER_SITE_OTHER): Payer: Medicare Other | Admitting: Gastroenterology

## 2020-12-21 ENCOUNTER — Encounter: Payer: Self-pay | Admitting: Gastroenterology

## 2020-12-21 VITALS — BP 165/85 | HR 75 | Temp 99.3°F | Ht 61.0 in | Wt 182.0 lb

## 2020-12-21 DIAGNOSIS — Z8719 Personal history of other diseases of the digestive system: Secondary | ICD-10-CM

## 2020-12-21 DIAGNOSIS — R768 Other specified abnormal immunological findings in serum: Secondary | ICD-10-CM

## 2020-12-21 NOTE — Progress Notes (Signed)
Jonathon Bellows MD, MRCP(U.K) [address]  [place]  [place], [place] [address]  Main: [phone]  Fax: [phone]   Primary Care Physician: [name], FNP  Primary Gastroenterologist:  Dr. Jonathon Bellows   [facility] follow-up for rectal bleeding  HPI: Jennifer [name] is a 70 y.o. female   Summary of history :   Last seen in 2021 for constipation  10/01/2019: Colonoscopy: Prior history of adenomatous polyps.  A 4 mm polyp noted in the ascending colon.  Resected.  On retroflexion internal hemorrhoids seen on pictures.  The polyp was a tubular adenoma.  Interval history   11/21/2019-12/21/2020   Admitted 11/17/2020 for Melena  , history of gastric sleeve surgery and then a redo duodenal switch 11/19/2020: EGD for dysphagia :Post surgical changes but no strictures. Colonoscopy was normal but prep was poor .   Felt likely hemorroidal bleed which she says has not recurred since her hospitalization.  Denies any hard stools.  Denies any straining.  Denies any blood in the stool. H pylori positive and treated with antibiotics which she says she has not completed yet and taking at this point time.   Current Outpatient Medications  Medication Sig Dispense Refill   acetaminophen (TYLENOL) 325 MG tablet Take 2 tablets (650 mg total) by mouth every 6 (six) hours as needed for mild pain (or Fever >/= 101). (Patient taking differently: Take 2,000 mg by mouth every 6 (six) hours as needed for mild pain (or Fever >/= 101).)     acyclovir (ZOVIRAX) 400 MG tablet Take 400 mg by mouth daily.     amoxicillin (AMOXIL) 500 MG capsule Take 1 capsule (500 mg total) by mouth 3 (three) times daily for 14 days. 42 capsule 0   calcium citrate (CALCITRATE - DOSED IN MG ELEMENTAL CALCIUM) 950 (200 Ca) MG tablet Take 200 mg of elemental calcium by mouth daily.     clarithromycin (BIAXIN) 500 MG tablet Take 1 tablet (500 mg total) by mouth 2 (two) times daily for 14 days. 28 tablet 0   ferrous  sulfate 325 (65 FE) MG tablet Take 1 tablet (325 mg total) by mouth daily with breakfast. 30 tablet 1   fluticasone (FLONASE) 50 MCG/ACT nasal spray Place 1 spray into both nostrils daily.     gabapentin (NEURONTIN) 300 MG capsule Take 1 capsule (300 mg total) by mouth 2 (two) times daily. 120 capsule 2   hydrocortisone (ANUSOL-HC) 25 MG suppository Place 1 suppository (25 mg total) rectally 2 (two) times daily. 12 suppository 1   hydrocortisone cream 1 % Apply 1 application topically 2 (two) times daily.     levocetirizine (XYZAL ALLERGY 24HR) 5 MG tablet Take 0.5 tablets (2.5 mg total) by mouth every evening. 30 tablet 0   levothyroxine (SYNTHROID) 50 MCG tablet TAKE 1 TABLET BY MOUTH EVERY DAY 90 tablet 1   losartan (COZAAR) 100 MG tablet TAKE 1 TABLET BY MOUTH EVERY DAY AT BEDTIME 90 tablet 0   Multiple Vitamin (MULTIVITAMIN WITH MINERALS) TABS tablet Take 1 tablet by mouth daily.     olopatadine (PATADAY) 0.1 % ophthalmic solution Place 1 drop into both eyes 2 (two) times daily. 5 mL 12   sertraline (ZOLOFT) 100 MG tablet Take 1 tablet (100 mg total) by mouth at bedtime. 90 tablet 1   triamterene-hydrochlorothiazide (MAXZIDE-25) 37.5-25 MG tablet Take 1 tablet by mouth daily.     No current facility-administered medications for this visit.    Allergies as of 12/21/2020 -  Review Complete 12/21/2020  Allergen Reaction Noted   Celecoxib Hives 07/25/2016   Hydrocodone Nausea Only 06/02/2020   Pollen extract     Sodium ferric gluconate [ferrous gluconate]  11/20/2020   Nasacort [triamcinolone] Other (See Comments) 12/07/2015   Vicodin [hydrocodone-acetaminophen] Nausea Only 09/03/2014    ROS:  General: Negative for anorexia, weight loss, fever, chills, fatigue, weakness. ENT: Negative for hoarseness, difficulty swallowing , nasal congestion. CV: Negative for chest pain, angina, palpitations, dyspnea on exertion, peripheral edema.  Respiratory: Negative for dyspnea at rest, dyspnea on  exertion, cough, sputum, wheezing.  GI: See history of present illness. GU:  Negative for dysuria, hematuria, urinary incontinence, urinary frequency, nocturnal urination.  Endo: Negative for unusual weight change.    Physical Examination:   BP (!) 165/85   Pulse 75   Temp 99.3 F (37.4 C) (Oral)   Ht 5\' 1"  (1.549 m)   Wt 182 lb (82.6 kg)   BMI 34.39 kg/m   General: Well-nourished, well-developed in no acute distress.  Eyes: No icterus. Conjunctivae pink. Neuro: Alert and oriented x 3.  Grossly intact. Skin: Warm and dry, no jaundice.   Psych: Alert and cooperative, normal mood and affect.   Imaging Studies: No results found.  Assessment and Plan:   Jennifer [name] is a 70 y.o. y/o female here to follow up after recent hospitalization in 11/2020 likely for hemorrhoidal bleed and was found to have H pylori positive on serology.    Plan H pylori breath test to be done 4 to 6 weeks after finishing her antibiotics, CBC No further issues, hemorrhoids.  Discussed about conservative management of her hemorrhoids, high-fiber diet.  Patient information provided.  Explained that if the issue recurs could consider banding of the hemorrhoids at my office.  Dr Jonathon Bellows  MD,MRCP [facility]) Follow up in as needed

## 2020-12-21 NOTE — Patient Instructions (Addendum)
Please finish taking your antibiotics. Then after 6 weeks of finishing, please come to our office or [facility] pharmacy of off [address] and have your H pylori breath test done to make sure that you no longer have the bacteria.  High-Fiber Eating Plan Fiber, also called dietary fiber, is a type of carbohydrate. It is found foods such as fruits, vegetables, whole grains, and beans. A high-fiber diet can have many health benefits. Your health care provider may recommend a high-fiber diet to help: Prevent constipation. Fiber can make your bowel movements more regular. Lower your cholesterol. Relieve the following conditions: Inflammation of veins in the anus (hemorrhoids). Inflammation of specific areas of the digestive tract (uncomplicated diverticulosis). A problem of the large intestine, also called the colon, that sometimes causes pain and diarrhea (irritable bowel syndrome, or IBS). Prevent overeating as part of a weight-loss plan. Prevent heart disease, type 2 diabetes, and certain cancers. What are tips for following this plan? Reading food labels  Check the nutrition facts label on food products for the amount of dietary fiber. Choose foods that have 5 grams of fiber or more per serving. The goals for recommended daily fiber intake include: Men (age 56 or younger): 34-38 g. Men (over age 39): 28-34 g. Women (age 2 or younger): 25-28 g. Women (over age 37): 22-25 g. Your daily fiber goal is _____________ g. Shopping Choose whole fruits and vegetables instead of processed forms, such as apple juice or applesauce. Choose a wide variety of high-fiber foods such as avocados, lentils, oats, and kidney beans. Read the nutrition facts label of the foods you choose. Be aware of foods with added fiber. These foods often have high sugar and sodium amounts per serving. Cooking Use whole-grain flour for baking and cooking. Cook with brown rice instead of white rice. Meal  planning Start the day with a breakfast that is high in fiber, such as a cereal that contains 5 g of fiber or more per serving. Eat breads and cereals that are made with whole-grain flour instead of refined flour or white flour. Eat brown rice, bulgur wheat, or millet instead of white rice. Use beans in place of meat in soups, salads, and pasta dishes. Be sure that half of the grains you eat each day are whole grains. General information You can get the recommended daily intake of dietary fiber by: Eating a variety of fruits, vegetables, grains, nuts, and beans. Taking a fiber supplement if you are not able to take in enough fiber in your diet. It is better to get fiber through food than from a supplement. Gradually increase how much fiber you consume. If you increase your intake of dietary fiber too quickly, you may have bloating, cramping, or gas. Drink plenty of water to help you digest fiber. Choose high-fiber snacks, such as berries, raw vegetables, nuts, and popcorn. What foods should I eat? Fruits Berries. Pears. Apples. Oranges. Avocado. Prunes and raisins. Dried figs. Vegetables Sweet potatoes. Spinach. Kale. Artichokes. Cabbage. Broccoli. Cauliflower. Green peas. Carrots. Squash. Grains Whole-grain breads. Multigrain cereal. Oats and oatmeal. Brown rice. Barley. Bulgur wheat. Eagle. Quinoa. Bran muffins. Popcorn. Rye wafer crackers. Meats and other proteins Navy beans, kidney beans, and pinto beans. Soybeans. Split peas. Lentils. Nuts and seeds. Dairy Fiber-fortified yogurt. Beverages Fiber-fortified soy milk. Fiber-fortified orange juice. Other foods Fiber bars. The items listed above may not be a complete list of recommended foods and beverages. Contact a dietitian for more information. What foods should I avoid? Fruits  Fruit juice. Cooked, strained fruit. Vegetables Fried potatoes. Canned vegetables. Well-cooked vegetables. Grains White bread. Pasta made with refined  flour. White rice. Meats and other proteins Fatty cuts of meat. Fried chicken or fried fish. Dairy Milk. Yogurt. Cream cheese. Sour cream. Fats and oils Butters. Beverages Soft drinks. Other foods Cakes and pastries. The items listed above may not be a complete list of foods and beverages to avoid. Talk with your dietitian about what choices are best for you. Summary Fiber is a type of carbohydrate. It is found in foods such as fruits, vegetables, whole grains, and beans. A high-fiber diet has many benefits. It can help to prevent constipation, lower blood cholesterol, aid weight loss, and reduce your risk of heart disease, diabetes, and certain cancers. Increase your intake of fiber gradually. Increasing fiber too quickly may cause cramping, bloating, and gas. Drink plenty of water while you increase the amount of fiber you consume. The best sources of fiber include whole fruits and vegetables, whole grains, nuts, seeds, and beans. This information is not intended to replace advice given to you by your health care provider. Make sure you discuss any questions you have with your health care provider. Document Revised: 05/09/2019 Document Reviewed: 05/09/2019 Elsevier Patient Education  2022 [place]. Hemorrhoids Hemorrhoids are swollen veins in and around the rectum or anus. There are two types of hemorrhoids: Internal hemorrhoids. These occur in the veins that are just inside the rectum. They may poke through to the outside and become irritated and painful. External hemorrhoids. These occur in the veins that are outside the anus and can be felt as a painful swelling or hard lump near the anus. Most hemorrhoids do not cause serious problems, and they can be managed with home treatments such as diet and lifestyle changes. If home treatments do not help the symptoms, procedures can be done to shrink or remove the hemorrhoids. What are the causes? This condition is caused by increased  pressure in the anal area. This pressure may result from various things, including: Constipation. Straining to have a bowel movement. Diarrhea. Pregnancy. Obesity. Sitting for long periods of time. Heavy lifting or other activity that causes you to strain. Anal sex. Riding a bike for a long period of time. What are the signs or symptoms? Symptoms of this condition include: Pain. Anal itching or irritation. Rectal bleeding. Leakage of stool (feces). Anal swelling. One or more lumps around the anus. How is this diagnosed? This condition can often be diagnosed through a visual exam. Other exams or tests may also be done, such as: An exam that involves feeling the rectal area with a gloved hand (digital rectal exam). An exam of the anal canal that is done using a small tube (anoscope). A blood test, if you have lost a significant amount of blood. A test to look inside the colon using a flexible tube with a camera on the end (sigmoidoscopy or colonoscopy). How is this treated? This condition can usually be treated at home. However, various procedures may be done if dietary changes, lifestyle changes, and other home treatments do not help your symptoms. These procedures can help make the hemorrhoids smaller or remove them completely. Some of these procedures involve surgery, and others do not. Common procedures include: Rubber band ligation. Rubber bands are placed at the base of the hemorrhoids to cut off their blood supply. Sclerotherapy. Medicine is injected into the hemorrhoids to shrink them. Infrared coagulation. A type of light energy is used to get  rid of the hemorrhoids. Hemorrhoidectomy surgery. The hemorrhoids are surgically removed, and the veins that supply them are tied off. Stapled hemorrhoidopexy surgery. The surgeon staples the base of the hemorrhoid to the rectal wall. Follow these instructions at home: Eating and drinking  Eat foods that have a lot of fiber in them,  such as whole grains, beans, nuts, fruits, and vegetables. Ask your health care provider about taking products that have added fiber (fiber supplements). Reduce the amount of fat in your diet. You can do this by eating low-fat dairy products, eating less red meat, and avoiding processed foods. Drink enough fluid to keep your urine pale yellow. Managing pain and swelling  Take warm sitz baths for 20 minutes, 3-4 times a day to ease pain and discomfort. You may do this in a bathtub or using a portable sitz bath that fits over the toilet. If directed, apply ice to the affected area. Using ice packs between sitz baths may be helpful. Put ice in a plastic bag. Place a towel between your skin and the bag. Leave the ice on for 20 minutes, 2-3 times a day. General instructions Take over-the-counter and prescription medicines only as told by your health care provider. Use medicated creams or suppositories as told. Get regular exercise. Ask your health care provider how much and what kind of exercise is best for you. In general, you should do moderate exercise for at least 30 minutes on most days of the week (150 minutes each week). This can include activities such as walking, biking, or yoga. Go to the bathroom when you have the urge to have a bowel movement. Do not wait. Avoid straining to have bowel movements. Keep the anal area dry and clean. Use wet toilet paper or moist towelettes after a bowel movement. Do not sit on the toilet for long periods of time. This increases blood pooling and pain. Keep all follow-up visits as told by your health care provider. This is important. Contact a health care provider if you have: Increasing pain and swelling that are not controlled by treatment or medicine. Difficulty having a bowel movement, or you are unable to have a bowel movement. Pain or inflammation outside the area of the hemorrhoids. Get help right away if you have: Uncontrolled bleeding from your  rectum. Summary Hemorrhoids are swollen veins in and around the rectum or anus. Most hemorrhoids can be managed with home treatments such as diet and lifestyle changes. Taking warm sitz baths can help ease pain and discomfort. In severe cases, procedures or surgery can be done to shrink or remove the hemorrhoids. This information is not intended to replace advice given to you by your health care provider. Make sure you discuss any questions you have with your health care provider. Document Revised: 07/15/2020 Document Reviewed: 07/15/2020 Elsevier Patient Education  [place].

## 2020-12-22 LAB — CBC
Hematocrit: 32.8 % — ABNORMAL LOW (ref 34.0–46.6)
Hemoglobin: 10.3 g/dL — ABNORMAL LOW (ref 11.1–15.9)
MCH: 23.8 pg — ABNORMAL LOW (ref 26.6–33.0)
MCHC: 31.4 g/dL — ABNORMAL LOW (ref 31.5–35.7)
MCV: 76 fL — ABNORMAL LOW (ref 79–97)
Platelets: 236 10*3/uL (ref 150–450)
RBC: 4.33 x10E6/uL (ref 3.77–5.28)
RDW: 19.6 % — ABNORMAL HIGH (ref 11.7–15.4)
WBC: 7.8 10*3/uL (ref 3.4–10.8)

## 2021-01-01 ENCOUNTER — Telehealth: Payer: Self-pay | Admitting: Family

## 2021-01-01 NOTE — Telephone Encounter (Signed)
I called and spoke with patient & medications she has questions regarding were actually the protonix as well as famotidine. I asked her to please call the ordering physician of the protonix to be sure if she needed to remain taking. We did not have on her list. I told her the famotidine was safe to take & may be beneficial to definitely before bed if she is dealing with a lot of acid reflux. Pt stated that she would not take the protonix since has not been & been okay. She will just take famotidine for now. She will call back if further questions.

## 2021-01-01 NOTE — Telephone Encounter (Signed)
Patient called and needs to speak to someone about the following medications: olopatadine (PATADAY) 0.1 % ophthalmic solution and Famotidine, she is not sure if she should be taking these medications.

## 2021-01-03 DIAGNOSIS — L089 Local infection of the skin and subcutaneous tissue, unspecified: Secondary | ICD-10-CM | POA: Diagnosis not present

## 2021-01-03 DIAGNOSIS — G5793 Unspecified mononeuropathy of bilateral lower limbs: Secondary | ICD-10-CM | POA: Diagnosis not present

## 2021-01-03 DIAGNOSIS — S90822A Blister (nonthermal), left foot, initial encounter: Secondary | ICD-10-CM | POA: Diagnosis not present

## 2021-01-14 ENCOUNTER — Telehealth: Payer: Self-pay | Admitting: Family

## 2021-01-14 ENCOUNTER — Telehealth: Payer: Self-pay

## 2021-01-14 NOTE — Telephone Encounter (Signed)
Pt called in complaining of sinus symptoms. She has stuffy nose, congestion, watery eyes. She confirmed that she did not have any SOB, fever or chills. Pt is scheduled with [name] tomorrow morning at 8 am.

## 2021-01-15 ENCOUNTER — Telehealth (INDEPENDENT_AMBULATORY_CARE_PROVIDER_SITE_OTHER): Payer: Medicare Other | Admitting: Family

## 2021-01-15 ENCOUNTER — Encounter: Payer: Self-pay | Admitting: Family

## 2021-01-15 VITALS — BP 135/80 | Temp 98.0°F | Ht 60.0 in | Wt 182.0 lb

## 2021-01-15 DIAGNOSIS — B9689 Other specified bacterial agents as the cause of diseases classified elsewhere: Secondary | ICD-10-CM

## 2021-01-15 DIAGNOSIS — J3489 Other specified disorders of nose and nasal sinuses: Secondary | ICD-10-CM | POA: Diagnosis not present

## 2021-01-15 DIAGNOSIS — J019 Acute sinusitis, unspecified: Secondary | ICD-10-CM

## 2021-01-15 MED ORDER — PREDNISONE 10 MG (21) PO TBPK: ORAL_TABLET | ORAL | 0 refills | Status: DC

## 2021-01-15 NOTE — Progress Notes (Signed)
Virtual Visit via Video Note  I connected with Jennifer [name] on 01/15/21 at  8:00 AM EST by a video enabled telemedicine application and verified that I am speaking with the correct person using two identifiers.  Location: Patient: Home Provider: Selinda [name]   I discussed the limitations of evaluation and management by telemedicine and the availability of in person appointments. The patient expressed understanding and agreed to proceed.  History of Present Illness: 70 year old female presents via telephone visit with concerns of sinus pressure over her foreheads on the right side, sinus drainage, itchy and watery eyes, irritated skin x 4 days and worsening. She just completed a round of doxycycline and had taken a few doses of amoxil due to a tooth issue. She denies fever. Has not tested for COVID but denies being around anyone who has been sick that she is aware of     Observations/Objective: A&O, NAD, stable   Assessment and Plan: Jennifer [name] was seen today for acute visit.  Diagnoses and all orders for this visit:  Acute bacterial sinusitis  Sinus pressure  Other orders -     predniSONE (STERAPRED UNI-PAK 21 TAB) 10 MG (21) TBPK tablet; As directed      Follow Up Instructions: advised to take a COVID rapid antigen test to r/o COVID-19. Will treat viral sinusitis with Prednisone. Drink plenty of fluids, add mucinex if necessary, rest, call the office if symptoms worsen or persist. Recheck as scheduled as sooner as needed.      I discussed the assessment and treatment plan with the patient. The patient was provided an opportunity to ask questions and all were answered. The patient agreed with the plan and demonstrated an understanding of the instructions.   The patient was advised to call back or seek an in-person evaluation if the symptoms worsen or if the condition fails to improve as anticipated.  I provided 20 minutes of non-face-to-face time during this  encounter.   Jennifer Arnold, FNP

## 2021-01-19 ENCOUNTER — Encounter: Payer: Self-pay | Admitting: Family

## 2021-01-19 ENCOUNTER — Other Ambulatory Visit: Payer: Self-pay

## 2021-01-19 ENCOUNTER — Ambulatory Visit (INDEPENDENT_AMBULATORY_CARE_PROVIDER_SITE_OTHER): Payer: Medicare Other | Admitting: Family

## 2021-01-19 VITALS — BP 126/82 | HR 55 | Temp 98.7°F | Resp 97 | Ht 60.0 in | Wt 186.2 lb

## 2021-01-19 DIAGNOSIS — D649 Anemia, unspecified: Secondary | ICD-10-CM | POA: Diagnosis not present

## 2021-01-19 DIAGNOSIS — S91302A Unspecified open wound, left foot, initial encounter: Secondary | ICD-10-CM

## 2021-01-19 DIAGNOSIS — L97529 Non-pressure chronic ulcer of other part of left foot with unspecified severity: Secondary | ICD-10-CM | POA: Diagnosis not present

## 2021-01-19 DIAGNOSIS — J309 Allergic rhinitis, unspecified: Secondary | ICD-10-CM | POA: Diagnosis not present

## 2021-01-19 DIAGNOSIS — J329 Chronic sinusitis, unspecified: Secondary | ICD-10-CM

## 2021-01-19 LAB — CBC WITH DIFFERENTIAL/PLATELET
Basophils Absolute: 0 10*3/uL (ref 0.0–0.1)
Basophils Relative: 0.2 % (ref 0.0–3.0)
Eosinophils Absolute: 0 10*3/uL (ref 0.0–0.7)
Eosinophils Relative: 0 % (ref 0.0–5.0)
HCT: 34.5 % — ABNORMAL LOW (ref 36.0–46.0)
Hemoglobin: 10.7 g/dL — ABNORMAL LOW (ref 12.0–15.0)
Lymphocytes Relative: 26 % (ref 12.0–46.0)
Lymphs Abs: 1.6 10*3/uL (ref 0.7–4.0)
MCHC: 31 g/dL (ref 30.0–36.0)
MCV: 78.2 fl (ref 78.0–100.0)
Monocytes Absolute: 0.5 10*3/uL (ref 0.1–1.0)
Monocytes Relative: 8.2 % (ref 3.0–12.0)
Neutro Abs: 4.1 10*3/uL (ref 1.4–7.7)
Neutrophils Relative %: 65.6 % (ref 43.0–77.0)
Platelets: 198 10*3/uL (ref 150.0–400.0)
RBC: 4.41 Mil/uL (ref 3.87–5.11)
RDW: 25.6 % — ABNORMAL HIGH (ref 11.5–15.5)
WBC: 6.3 10*3/uL (ref 4.0–10.5)

## 2021-01-19 LAB — IBC + FERRITIN
Ferritin: 20.3 ng/mL (ref 10.0–291.0)
Iron: 32 ug/dL — ABNORMAL LOW (ref 42–145)
Saturation Ratios: 6.7 % — ABNORMAL LOW (ref 20.0–50.0)
TIBC: 474.6 ug/dL — ABNORMAL HIGH (ref 250.0–450.0)
Transferrin: 339 mg/dL (ref 212.0–360.0)

## 2021-01-19 MED ORDER — MONTELUKAST SODIUM 10 MG PO TABS: 10.0000 mg | ORAL_TABLET | Freq: Every day | ORAL | 1 refills | Status: DC

## 2021-01-19 MED ORDER — TRIAMTERENE-HCTZ 37.5-25 MG PO TABS: 1.0000 | ORAL_TABLET | Freq: Every day | ORAL | 1 refills | Status: DC

## 2021-01-19 NOTE — Patient Instructions (Addendum)
Start singulair AFTER prednisone  If congestion, sinus pressure not completely resolved , let me know as I recommend sinus imaging ( sinus CT).   Referral to podiatry.  Let us know if you dont hear back within a week in regards to an appointment being scheduled.

## 2021-01-19 NOTE — Progress Notes (Signed)
Subjective:    Patient ID: Jennifer [name], female    DOB: 06-Nov-1950, 71 y.o.   MRN: [id number]  CC: [name] is a 71 y.o. female who presents today for follow up.   HPI:  Continues to complain of nasal congestion x one week, unchanged. Watery eyes has improved. Eyes not matted shut.  Resolved 6 weeks ago and returned one week ago. No change on doxycycline. Prednisone resolved sinus pain.  She has clear discharge from both eyes. She doesn't wear contacts. She wears glasses. No eye pain, vision changes.    Seen 11/25/20 by colleague Flinchum for this. Started on pataday and xyzal allergy tablet. She stopped zyrtec.     Crown for tooth is planned for tomorrow.   She never saw hematology after referred 11/17/20. She is not taking iron.   Seen by video 01/15/21 for suspected acute bacterial sinusitis. She started prednisone.  Prior she has been on amoxicillin for h pylori which she didn't finish. She completed clarithromycin.   Complains of left foot heal blister x 1 week, improved. Pain has resolved. No purulent discharge  Onset when soaking feet in epsom salt and manually exfoliate with soaking tub and loofa.   Seen at urgent care 01/03/21 for left foot blister and treated with doxycyline. She has 3 more days of doxycycline to complete.  Consult with [name] 12/21/20 to review hospitalization, hemorrhoidal bleed. Recent h/o h pylori. Plan to retest h pylori, cbc in 4-6 weeks. Hemoglobin 10.3 one month ago ( prior 9.2)  Seen by Dr Pryor Ochoa 09/24/20 for tinnitus and he started maxide.    HISTORY:  Past Medical History:  Diagnosis Date   Anxiety    Bariatric surgery status    Constipation    Dysrhythmia    Elevated liver enzymes    GERD (gastroesophageal reflux disease)    Hemorrhoids    Herpes genitalis    High cholesterol    Hyperlipidemia    Hypertension    Hypothyroidism    Neuropathy    Osteoarthritis    Sleep apnea    Past Surgical History:  Procedure  Laterality Date   ABDOMINAL HYSTERECTOMY     total for fibroids no h/o abnormal pap   bariatric sleeve  2015   BREAST EXCISIONAL BIOPSY Left 1998   carpal tunnel repair     COLONOSCOPY WITH PROPOFOL N/A 02/11/2016   Procedure: COLONOSCOPY WITH PROPOFOL;  Surgeon: Jonathon Bellows, MD;  Location: [facility] ENDOSCOPY;  Service: Endoscopy;  Laterality: N/A;   COLONOSCOPY WITH PROPOFOL N/A 10/01/2019   Procedure: COLONOSCOPY WITH PROPOFOL;  Surgeon: Jonathon Bellows, MD;  Location: [facility] ENDOSCOPY;  Service: Gastroenterology;  Laterality: N/A;   COLONOSCOPY WITH PROPOFOL N/A 11/19/2020   Procedure: COLONOSCOPY WITH PROPOFOL;  Surgeon: Jonathon Bellows, MD;  Location: [facility] ENDOSCOPY;  Service: Gastroenterology;  Laterality: N/A;   ESOPHAGOGASTRODUODENOSCOPY (EGD) WITH PROPOFOL N/A 12/02/2019   Procedure: ESOPHAGOGASTRODUODENOSCOPY (EGD) WITH PROPOFOL;  Surgeon: Jonathon Bellows, MD;  Location: [facility] ENDOSCOPY;  Service: Gastroenterology;  Laterality: N/A;   ESOPHAGOGASTRODUODENOSCOPY (EGD) WITH PROPOFOL N/A 11/19/2020   Procedure: ESOPHAGOGASTRODUODENOSCOPY (EGD) WITH PROPOFOL;  Surgeon: Jonathon Bellows, MD;  Location: [facility] ENDOSCOPY;  Service: Gastroenterology;  Laterality: N/A;   HEMORRHOID SURGERY     Family History  Problem Relation Age of Onset   Breast cancer Sister 81       materal 1/2 sister   Hypertension Sister    Hypertension Mother    Heart disease Father    Hypertension Brother     Allergies:  Celecoxib, Hydrocodone, Pollen extract, Sodium ferric gluconate [ferrous gluconate], Nasacort [triamcinolone], and Vicodin [hydrocodone-acetaminophen] Current Outpatient Medications on File Prior to Visit  Medication Sig Dispense Refill   acetaminophen (TYLENOL) 325 MG tablet Take 2 tablets (650 mg total) by mouth every 6 (six) hours as needed for mild pain (or Fever >/= 101). (Patient taking differently: Take 2,000 mg by mouth every 6 (six) hours as needed for mild pain (or Fever >/= 101).)     acyclovir (ZOVIRAX) 400 MG  tablet Take 400 mg by mouth daily.     calcium citrate (CALCITRATE - DOSED IN MG ELEMENTAL CALCIUM) 950 (200 Ca) MG tablet Take 200 mg of elemental calcium by mouth daily.     fluticasone (FLONASE) 50 MCG/ACT nasal spray Place 1 spray into both nostrils daily.     gabapentin (NEURONTIN) 300 MG capsule Take 1 capsule (300 mg total) by mouth 2 (two) times daily. 120 capsule 2   hydrocortisone (ANUSOL-HC) 25 MG suppository Place 1 suppository (25 mg total) rectally 2 (two) times daily. 12 suppository 1   hydrocortisone cream 1 % Apply 1 application topically 2 (two) times daily.     levocetirizine (XYZAL) 5 MG tablet Take 2.5 mg by mouth every evening.     levothyroxine (SYNTHROID) 50 MCG tablet TAKE 1 TABLET BY MOUTH EVERY DAY 90 tablet 1   losartan (COZAAR) 100 MG tablet TAKE 1 TABLET BY MOUTH EVERY DAY AT BEDTIME 90 tablet 0   Multiple Vitamin (MULTIVITAMIN WITH MINERALS) TABS tablet Take 1 tablet by mouth daily.     olopatadine (PATADAY) 0.1 % ophthalmic solution Place 1 drop into both eyes 2 (two) times daily. 5 mL 12   predniSONE (STERAPRED UNI-PAK 21 TAB) 10 MG (21) TBPK tablet As directed 21 tablet 0   sertraline (ZOLOFT) 100 MG tablet Take 1 tablet (100 mg total) by mouth at bedtime. 90 tablet 1   No current facility-administered medications on file prior to visit.    Social History   Tobacco Use   Smoking status: Former    Packs/day: 1.50    Years: 24.00    Pack years: 36.00    Types: Cigarettes    Quit date: 1993    Years since quitting: 30.0   Smokeless tobacco: Never   Tobacco comments:    quit 1995.   Vaping Use   Vaping Use: Never used  Substance Use Topics   Alcohol use: Not Currently   Drug use: No    Review of Systems  Constitutional:  Negative for chills and fever.  HENT:  Positive for congestion and sinus pressure. Negative for ear pain and postnasal drip.   Eyes:  Positive for discharge (clear, bilateral). Negative for photophobia, itching and visual  disturbance.  Respiratory:  Negative for cough, shortness of breath and wheezing.   Cardiovascular:  Negative for chest pain and palpitations.  Gastrointestinal:  Negative for nausea and vomiting.     Objective:    BP 126/82 (BP Location: Left Arm, Patient Position: Sitting, Cuff Size: Large)    Pulse (!) 55    Temp 98.7 F (37.1 C) (Oral)    Resp (!) 97    Ht 5' (1.524 m)    Wt 186 lb 3.2 oz (84.5 kg)    BMI 36.36 kg/m  BP Readings from Last 3 Encounters:  01/19/21 126/82  01/15/21 135/80  12/21/20 (!) 165/85   Wt Readings from Last 3 Encounters:  01/19/21 186 lb 3.2 oz (84.5 kg)  01/15/21 182 lb (82.6  kg)  12/21/20 182 lb (82.6 kg)    Physical Exam Vitals reviewed.  Constitutional:      Appearance: She is well-developed.  HENT:     Head: Normocephalic and atraumatic.     Right Ear: Hearing, tympanic membrane, ear canal and external ear normal. No decreased hearing noted. No drainage, swelling or tenderness. No middle ear effusion. No foreign body. Tympanic membrane is not erythematous or bulging.     Left Ear: Hearing, tympanic membrane, ear canal and external ear normal. No decreased hearing noted. No drainage, swelling or tenderness.  No middle ear effusion. No foreign body. Tympanic membrane is not erythematous or bulging.     Nose: Nose normal. No rhinorrhea.     Right Sinus: No maxillary sinus tenderness or frontal sinus tenderness.     Left Sinus: No maxillary sinus tenderness or frontal sinus tenderness.     Mouth/Throat:     Pharynx: Uvula midline. No oropharyngeal exudate or posterior oropharyngeal erythema.     Tonsils: No tonsillar abscesses.  Eyes:     General: Lids are normal.        Right eye: No discharge.        Left eye: No discharge.     Conjunctiva/sclera: Conjunctivae normal.     Right eye: Right conjunctiva is not injected. No hemorrhage.    Left eye: Left conjunctiva is not injected. No hemorrhage. Cardiovascular:     Rate and Rhythm: Regular rhythm.      Pulses: Normal pulses.     Heart sounds: Normal heart sounds.  Pulmonary:     Effort: Pulmonary effort is normal.     Breath sounds: Normal breath sounds. No wheezing, rhonchi or rales.  Musculoskeletal:       Feet:  Feet:     Comments: Left heel brown scab with part of scab missing and epidermis exposed. Sensation intact. No purulent discharge, tenderness with palpation.  Lymphadenopathy:     Head:     Right side of head: No submental, submandibular, tonsillar, preauricular, posterior auricular or occipital adenopathy.     Left side of head: No submental, submandibular, tonsillar, preauricular, posterior auricular or occipital adenopathy.     Cervical: No cervical adenopathy.  Skin:    General: Skin is warm and dry.  Neurological:     Mental Status: She is alert.  Psychiatric:        Speech: Speech normal.        Behavior: Behavior normal.        Thought Content: Thought content normal.       Assessment & Plan:   Problem List Items Addressed This Visit       Respiratory   Allergic rhinitis - Primary   Relevant Medications   montelukast (SINGULAIR) 10 MG tablet   Sinusitis    Uncontrolled. Benign HEENT exam. Afebrile. Watery eyes has improved. She didn't complete amoxicillin for recent treatment of h pylori however completed clarithromycin. She has yet to complete doxycycline which she has 3 more days to complete. She also has 2 more days of prednisone to complete. Advised to finish. She will be tested for h pylori eradication and I advised her to inform [name] that she didn't complete amoxicillin.  After she has completed prednisone, doxycycline, advised to start Singulair as watery eyes I suspect symptom of allergic rhinitis. Advised probiotics. If no resolution of symptoms advised CT sinus. She will let me know how she is doing.       Relevant Medications  levocetirizine (XYZAL) 5 MG tablet     Other   Anemia    Hemoglobin 10.7 from 10.3. She has been  microcytic, since resolved. Ferritin increased from 6 to 20. She is not taking iron nor has seen hematology. Will again express importance of consult with hematology to discuss role of iron infusions after bariatric surgery.       Relevant Orders   CBC with Differential/Platelet (Completed)   IBC + Ferritin (Completed)   Open wound of left heel    New. Uncontrolled. No evidence of infection.  Discussed with patient today I suspect initial injury from Epson salt, chemical and manual debridement of heel.  Advised to complete doxycycline.  Referral to podiatry for evaluation, debridement if needed      Other Visit Diagnoses     Chronic ulcer of great toe of left foot, unspecified ulcer stage [facility])       Relevant Orders   Ambulatory referral to Podiatry        I have discontinued [place]'s ferrous sulfate and fexofenadine. I am also having her start on montelukast. Additionally, I am having her maintain her acetaminophen, acyclovir, levothyroxine, sertraline, gabapentin, hydrocortisone, multivitamin with minerals, fluticasone, hydrocortisone cream, calcium citrate, losartan, olopatadine, predniSONE, and levocetirizine.   Meds ordered this encounter  Medications   montelukast (SINGULAIR) 10 MG tablet    Sig: Take 1 tablet (10 mg total) by mouth at bedtime.    Dispense:  90 tablet    Refill:  1    Order Specific Question:   Supervising Provider    Answer:   Crecencio Mc [[id number]]    Return precautions given.   Risks, benefits, and alternatives of the medications and treatment plan prescribed today were discussed, and patient expressed understanding.   Education regarding symptom management and diagnosis given to patient on AVS.  Continue to follow with [name], FNP for routine health maintenance.   Jennifer [name] and I agreed with plan.   [name], FNP

## 2021-01-20 ENCOUNTER — Other Ambulatory Visit: Payer: Self-pay

## 2021-01-20 ENCOUNTER — Telehealth: Payer: Self-pay | Admitting: Family

## 2021-01-20 DIAGNOSIS — D649 Anemia, unspecified: Secondary | ICD-10-CM

## 2021-01-20 DIAGNOSIS — S91302A Unspecified open wound, left foot, initial encounter: Secondary | ICD-10-CM | POA: Insufficient documentation

## 2021-01-20 NOTE — Telephone Encounter (Signed)
Patient tested positive today for COVID. She wanted to know if she could take the antiviral medication and what else could she take. No available appointments.

## 2021-01-20 NOTE — Assessment & Plan Note (Addendum)
Hemoglobin 10.7 from 10.3. She has been microcytic, since resolved. Ferritin increased from 6 to 20. She is not taking iron nor has seen hematology. Will again express importance of consult with hematology to discuss role of iron infusions after bariatric surgery.

## 2021-01-20 NOTE — Assessment & Plan Note (Addendum)
Uncontrolled. Benign HEENT exam. Afebrile. Watery eyes has improved. She didn't complete amoxicillin for recent treatment of h pylori however completed clarithromycin. She has yet to complete doxycycline which she has 3 more days to complete. She also has 2 more days of prednisone to complete. Advised to finish. She will be tested for h pylori eradication and I advised her to inform [name] that she didn't complete amoxicillin.  After she has completed prednisone, doxycycline, advised to start Singulair as watery eyes I suspect symptom of allergic rhinitis. Advised probiotics. If no resolution of symptoms advised CT sinus. She will let me know how she is doing.

## 2021-01-20 NOTE — Assessment & Plan Note (Addendum)
New. Uncontrolled. No evidence of infection.  Discussed with patient today I suspect initial injury from Epson salt, chemical and manual debridement of heel.  Advised to complete doxycycline.  Referral to podiatry for evaluation, debridement if needed

## 2021-01-20 NOTE — Telephone Encounter (Signed)
Noted  

## 2021-01-20 NOTE — Telephone Encounter (Signed)
pt tested positive for covid at [facility] on 1/4. Pt has telephone appt tomorrow with Karlyn Agee

## 2021-01-21 ENCOUNTER — Encounter: Payer: Self-pay | Admitting: Internal Medicine

## 2021-01-21 ENCOUNTER — Ambulatory Visit (INDEPENDENT_AMBULATORY_CARE_PROVIDER_SITE_OTHER): Payer: Medicare Other | Admitting: Internal Medicine

## 2021-01-21 ENCOUNTER — Other Ambulatory Visit: Payer: Self-pay

## 2021-01-21 VITALS — BP 177/92 | HR 69 | Ht 60.0 in | Wt 186.0 lb

## 2021-01-21 DIAGNOSIS — H04203 Unspecified epiphora, bilateral lacrimal glands: Secondary | ICD-10-CM | POA: Diagnosis not present

## 2021-01-21 DIAGNOSIS — R0981 Nasal congestion: Secondary | ICD-10-CM | POA: Diagnosis not present

## 2021-01-21 DIAGNOSIS — J40 Bronchitis, not specified as acute or chronic: Secondary | ICD-10-CM | POA: Diagnosis not present

## 2021-01-21 DIAGNOSIS — U071 COVID-19: Secondary | ICD-10-CM

## 2021-01-21 DIAGNOSIS — H1033 Unspecified acute conjunctivitis, bilateral: Secondary | ICD-10-CM | POA: Diagnosis not present

## 2021-01-21 MED ORDER — DM-GUAIFENESIN ER 60-1200 MG PO TB12: 1.0000 | ORAL_TABLET | Freq: Two times a day (BID) | ORAL | 2 refills | Status: DC

## 2021-01-21 MED ORDER — HYDROCOD POLST-CPM POLST ER 10-8 MG/5ML PO SUER: 5.0000 mL | Freq: Every evening | ORAL | 0 refills | Status: DC | PRN

## 2021-01-21 MED ORDER — BENZONATATE 200 MG PO CAPS
200.0000 mg | ORAL_CAPSULE | Freq: Two times a day (BID) | ORAL | 0 refills | Status: DC | PRN
Start: 2021-01-21 — End: 2021-02-06

## 2021-01-21 MED ORDER — MOLNUPIRAVIR EUA 200MG CAPSULE: 4.0000 | ORAL_CAPSULE | Freq: Two times a day (BID) | ORAL | 0 refills | Status: AC

## 2021-01-21 MED ORDER — ERYTHROMYCIN 5 MG/GM OP OINT: 1.0000 "application " | TOPICAL_OINTMENT | Freq: Three times a day (TID) | OPHTHALMIC | 0 refills | Status: DC

## 2021-01-21 MED ORDER — SALINE SPRAY 0.65 % NA SOLN: 2.0000 | NASAL | 2 refills | Status: AC | PRN

## 2021-01-21 MED ORDER — OLOPATADINE HCL 0.1 % OP SOLN: 1.0000 [drp] | Freq: Two times a day (BID) | OPHTHALMIC | 12 refills | Status: DC

## 2021-01-21 NOTE — Patient Instructions (Addendum)
If needing prescription strength medication we will need to make an appointment with a provider.  These are over the counter medication options:  Mucinex dm green label for cough or robitussin DM  Multivitamin or below vitamins  Vitamin C 1000 mg daily.  Vitamin D3 4000 Iu (units) daily.  Zinc 100 mg daily.  Quercetin 250-500 mg 2 times per day   Elderberry  Oil of oregano  cepacol or chloroseptic spray Warm salt water gargles +hydrogen peroxide Sugar free cough drops  Warm tea with honey and lemon  Hydration  Try to eat though you dont feel like it   Tylenol or Advil  Nasal saline and Flonase 2 sprays nasal congestion  If sneezing/runny nose over the counter allergy pill claritin,allegra, zyrtec, xyzal Quarantine x 10-14 days 14 days preferred   Monitor pulse oximeter, buy from [employer] if oxygen is less than 90 please go to the hospital.        Are you feeling really sick? Shortness of breath, cough, chest pain?, dizziness? Confusion   If so let me know  If worsening, go to hospital or [facility] [place] [facility] Urgent care for further treatment.     Molnupiravir Oral Capsules What is this medication? MOLNUPIRAVIR (mol nue pir a vir) treats COVID-19. It is an antiviral medication. It may decrease the risk of developing severe symptoms of COVID-19. It may also decrease the chance of going to the hospital. This medication is not approved by the FDA. The FDA has authorized emergency use of this medication during the COVID-19 pandemic. This medicine may be used for other purposes; ask your health care provider or pharmacist if you have questions. COMMON BRAND NAME(S): LAGEVRIO What should I tell my care team before I take this medication? They need to know if you have any of these conditions: Any allergies Any serious illness An unusual or allergic reaction to molnupiravir, other medications, foods, dyes, or preservatives Pregnant or trying to get pregnant Breast-feeding How should I use  this medication? Take this medication by mouth with water. Take it as directed on the prescription label at the same time every day. Do not cut, crush or chew this medication. Swallow the capsules whole. You can take it with or without food. If it upsets your stomach, take it with food. Take all of this medication unless your care team tells you to stop it early. Keep taking it even if you think you are better. Talk to your care team about the use of this medication in children. Special care may be needed. Overdosage: If you think you have taken too much of this medicine contact a poison control center or emergency room at once. NOTE: This medicine is only for you. Do not share this medicine with others. What if I miss a dose? If you miss a dose, take it as soon as you can unless it is more than 10 hours late. If it is more than 10 hours late, skip the missed dose. Take the next dose at the normal time. Do not take extra or 2 doses at the same time to make up for the missed dose. What may interact with this medication? Interactions have not been studied. This list may not describe all possible interactions. Give your health care provider a list of all the medicines, herbs, non-prescription drugs, or dietary supplements you use. Also tell them if you smoke, drink alcohol, or use illegal drugs. Some items may interact with your medicine. What should I watch for while using  this medication? Your condition will be monitored carefully while you are receiving this medication. Visit your care team for regular checkups. Tell your care team if your symptoms do not start to get better or if they get worse. Do not become pregnant while taking this medication. You may need a pregnancy test before starting this medication. Women must use a reliable form of birth control while taking this medication and for 4 days after stopping the medication. Women should inform their care team if they wish to become pregnant or  think they might be pregnant. Men should not father a child while taking this medication and for 3 months after stopping it. There is potential for serious harm to an unborn child. Talk to your care team for more information. Do not breast-feed an infant while taking this medication and for 4 days after stopping the medication. What side effects may I notice from receiving this medication? Side effects that you should report to your care team as soon as possible: Allergic reactions--skin rash, itching, hives, swelling of the face, lips, tongue, or throat Side effects that usually do not require medical attention (report these to your care team if they continue or are bothersome): Diarrhea Dizziness Nausea This list may not describe all possible side effects. Call your doctor for medical advice about side effects. You may report side effects to FDA at 1-800-FDA-1088. Where should I keep my medication? Keep out of the reach of children and pets. Store at room temperature between 20 and 25 degrees C (68 and 77 degrees F). Get rid of any unused medication after the expiration date. To get rid of medications that are no longer needed or have expired: Take the medication to a medication take-back program. Check with your pharmacy or law enforcement to find a location. If you cannot return the medication, check the label or package insert to see if the medication should be thrown out in the garbage or flushed down the toilet. If you are not sure, ask your care team. If it is safe to put it in the trash, take the medication out of the container. Mix the medication with cat litter, dirt, coffee grounds, or other unwanted substance. Seal the mixture in a bag or container. Put it in the trash. NOTE: This sheet is a summary. It may not cover all possible information. If you have questions about this medicine, talk to your doctor, pharmacist, or health care provider.  2022 Elsevier/Gold Standard (2020-01-13  00:00:00)

## 2021-01-21 NOTE — Progress Notes (Signed)
Scheduled for 02/04/21

## 2021-01-21 NOTE — Progress Notes (Signed)
Patient tested positive on 01/20/21. States she wears masks and does not know who she got this from.

## 2021-01-21 NOTE — Progress Notes (Signed)
Telephone Note  I connected with Jennifer [name]   on 01/21/21 at  2:50am speaking with the correct person using two identifiers.  Location patient: [place] Location provider:work or home office Persons participating in the virtual visit: patient, provider  I discussed the limitations and requested verbal permission for telemedicine visit. The patient expressed understanding and agreed to proceed.   HPI:  Acute telemedicine visit for : +covid 19 01/20/21 with h/a sore throat which resolved bodyaches, diarrhea, watery eyes and red eyes, cough and congestion 01/20/20 she thought she had a sinus infection then took covid 19 test and + at pharmacy Finished last of prednisone today BP was 177/92 and ate canned soup Allergies  Allergen Reactions   Celecoxib Hives   Hydrocodone Nausea Only   Pollen Extract    Sodium Ferric Gluconate [Ferrous Gluconate]     IV ferric gluconate - shortly after the infusion developed back pain shooting into left arm and bilateral hand swelling   Nasacort [Triamcinolone] Other (See Comments)    Nasal - Nose Bleeds   Vicodin [Hydrocodone-Acetaminophen] Nausea Only   -COVID-19 vaccine status: 4/4 Immunization History  Administered Date(s) Administered   Fluad Quad(high Dose 65+) 10/02/2020   Hep A / Hep B 05/24/2013, 11/21/2013   Influenza Split 10/21/2013   Influenza, High Dose Seasonal PF 10/24/2016, 09/06/2018   Influenza,inj,Quad PF,6+ Mos 11/08/2013, 10/28/2014   Influenza-Unspecified 12/05/2011, 10/28/2015, 10/24/2016, 09/06/2017, 10/18/2019   PFIZER(Purple Top)SARS-COV-2 Vaccination 03/15/2019, 04/09/2019, 04/09/2019, 11/01/2019, 08/07/2020   Pneumococcal Conjugate-13 12/05/2014, 04/06/2016   Pneumococcal Polysaccharide-23 06/05/2017   Tdap 02/05/2014, 08/07/2018   Zoster Recombinat (Shingrix) 12/25/2018   Zoster, Live 01/09/2012, 12/05/2014     ROS: See pertinent positives and negatives per HPI.  Past Medical History:  Diagnosis Date   Anxiety     Bariatric surgery status    Constipation    COVID-19    Dysrhythmia    Elevated liver enzymes    GERD (gastroesophageal reflux disease)    Hemorrhoids    Herpes genitalis    High cholesterol    Hyperlipidemia    Hypertension    Hypothyroidism    Neuropathy    Osteoarthritis    Sleep apnea     Past Surgical History:  Procedure Laterality Date   ABDOMINAL HYSTERECTOMY     total for fibroids no h/o abnormal pap   bariatric sleeve  2015   BREAST EXCISIONAL BIOPSY Left 1998   carpal tunnel repair     COLONOSCOPY WITH PROPOFOL N/A 02/11/2016   Procedure: COLONOSCOPY WITH PROPOFOL;  Surgeon: Jonathon Bellows, MD;  Location: [facility] ENDOSCOPY;  Service: Endoscopy;  Laterality: N/A;   COLONOSCOPY WITH PROPOFOL N/A 10/01/2019   Procedure: COLONOSCOPY WITH PROPOFOL;  Surgeon: Jonathon Bellows, MD;  Location: [facility] ENDOSCOPY;  Service: Gastroenterology;  Laterality: N/A;   COLONOSCOPY WITH PROPOFOL N/A 11/19/2020   Procedure: COLONOSCOPY WITH PROPOFOL;  Surgeon: Jonathon Bellows, MD;  Location: [facility] ENDOSCOPY;  Service: Gastroenterology;  Laterality: N/A;   ESOPHAGOGASTRODUODENOSCOPY (EGD) WITH PROPOFOL N/A 12/02/2019   Procedure: ESOPHAGOGASTRODUODENOSCOPY (EGD) WITH PROPOFOL;  Surgeon: Jonathon Bellows, MD;  Location: [facility] ENDOSCOPY;  Service: Gastroenterology;  Laterality: N/A;   ESOPHAGOGASTRODUODENOSCOPY (EGD) WITH PROPOFOL N/A 11/19/2020   Procedure: ESOPHAGOGASTRODUODENOSCOPY (EGD) WITH PROPOFOL;  Surgeon: Jonathon Bellows, MD;  Location: [facility] ENDOSCOPY;  Service: Gastroenterology;  Laterality: N/A;   HEMORRHOID SURGERY       Current Outpatient Medications:    acetaminophen (TYLENOL) 325 MG tablet, Take 2 tablets (650 mg total) by mouth every 6 (six) hours as needed for mild  pain (or Fever >/= 101). (Patient taking differently: Take 2,000 mg by mouth every 6 (six) hours as needed for mild pain (or Fever >/= 101).), Disp: , Rfl:    acyclovir (ZOVIRAX) 400 MG tablet, Take 400 mg by mouth daily., Disp: , Rfl:     Ascorbic Acid (VITAMIN C PO), Take by mouth., Disp: , Rfl:    benzonatate (TESSALON) 200 MG capsule, Take 1 capsule (200 mg total) by mouth 2 (two) times daily as needed for cough., Disp: 40 capsule, Rfl: 0   calcium citrate (CALCITRATE - DOSED IN MG ELEMENTAL CALCIUM) 950 (200 Ca) MG tablet, Take 200 mg of elemental calcium by mouth daily., Disp: , Rfl:    chlorpheniramine-HYDROcodone (TUSSIONEX PENNKINETIC ER) 10-8 MG/5ML SUER, Take 5 mLs by mouth at bedtime as needed., Disp: 115 mL, Rfl: 0   Dextromethorphan-Guaifenesin 60-1200 MG 12hr tablet, Take 1 tablet by mouth every 12 (twelve) hours., Disp: 60 tablet, Rfl: 2   erythromycin ophthalmic ointment, Place 1 application into both eyes 3 (three) times daily. X 4-7 days, Disp: 3.5 g, Rfl: 0   fluticasone (FLONASE) 50 MCG/ACT nasal spray, Place 1 spray into both nostrils daily., Disp: , Rfl:    gabapentin (NEURONTIN) 300 MG capsule, Take 1 capsule (300 mg total) by mouth 2 (two) times daily., Disp: 120 capsule, Rfl: 2   hydrocortisone (ANUSOL-HC) 25 MG suppository, Place 1 suppository (25 mg total) rectally 2 (two) times daily., Disp: 12 suppository, Rfl: 1   levothyroxine (SYNTHROID) 50 MCG tablet, TAKE 1 TABLET BY MOUTH EVERY DAY, Disp: 90 tablet, Rfl: 1   losartan (COZAAR) 100 MG tablet, TAKE 1 TABLET BY MOUTH EVERY DAY AT BEDTIME, Disp: 90 tablet, Rfl: 0   molnupiravir EUA (LAGEVRIO) 200 mg CAPS capsule, Take 4 capsules (800 mg total) by mouth 2 (two) times daily for 5 days., Disp: 40 capsule, Rfl: 0   montelukast (SINGULAIR) 10 MG tablet, Take 1 tablet (10 mg total) by mouth at bedtime., Disp: 90 tablet, Rfl: 1   Multiple Vitamin (MULTIVITAMIN WITH MINERALS) TABS tablet, Take 1 tablet by mouth daily., Disp: , Rfl:    Multiple Vitamins-Minerals (ZINC PO), Take by mouth., Disp: , Rfl:    sertraline (ZOLOFT) 100 MG tablet, Take 1 tablet (100 mg total) by mouth at bedtime., Disp: 90 tablet, Rfl: 1   sodium chloride (OCEAN) 0.65 % SOLN nasal spray,  Place 2 sprays into both nostrils as needed for congestion., Disp: 30 mL, Rfl: 2   triamterene-hydrochlorothiazide (MAXZIDE-25) 37.5-25 MG tablet, Take 1 tablet by mouth daily., Disp: 90 tablet, Rfl: 1   hydrocortisone cream 1 %, Apply 1 application topically 2 (two) times daily. (Patient not taking: Reported on 01/21/2021), Disp: , Rfl:    levocetirizine (XYZAL) 5 MG tablet, Take 2.5 mg by mouth every evening. (Patient not taking: Reported on 01/21/2021), Disp: , Rfl:    olopatadine (PATADAY) 0.1 % ophthalmic solution, Place 1 drop into both eyes 2 (two) times daily., Disp: 5 mL, Rfl: 12  EXAM:  VITALS per patient if applicable:  GENERAL: alert, oriented, appears well and in no acute distress  PSYCH/NEURO: pleasant and cooperative, no obvious depression or anxiety, speech and thought processing grossly intact  ASSESSMENT AND PLAN:  Discussed the following assessment and plan:  COVID-19 - Plan: molnupiravir EUA (LAGEVRIO) 200 mg CAPS capsule Supportive care   Watery eyes-pataday Acute conjunctivitis of both eyes, unspecified acute conjunctivitis type - Plan: erythromycin ophthalmic ointment  Bronchitis due to COVID-19 virus - Plan: chlorpheniramine-HYDROcodone (TUSSIONEX PENNKINETIC  ER) 10-8 MG/5ML SUER, benzonatate (TESSALON) 200 MG capsule, Dextromethorphan-Guaifenesin 60-1200 MG 12hr tablet  Nasal congestion - Plan: sodium chloride (OCEAN) 0.65 % SOLN nasal spray  -we discussed possible serious and likely etiologies, options for evaluation and workup, limitations of telemedicine visit vs in person visit, treatment, treatment risks and precautions. Pt is agreeable to treatment via telemedicine at this moment.   I discussed the assessment and treatment plan with the patient. The patient was provided an opportunity to ask questions and all were answered. The patient agreed with the plan and demonstrated an understanding of the instructions.    Time spent 20 minutes Delorise Jackson, MD

## 2021-01-21 NOTE — Telephone Encounter (Signed)
Patient scheduled for 01/22/20 with Dr Olivia Mackie McLean-Scocuzza

## 2021-01-29 NOTE — Progress Notes (Signed)
Pt scheduled 02/10/21.

## 2021-02-01 ENCOUNTER — Encounter: Payer: Self-pay | Admitting: Family

## 2021-02-01 ENCOUNTER — Telehealth: Payer: Self-pay | Admitting: Family

## 2021-02-01 NOTE — Telephone Encounter (Signed)
Patient called and wanted to know what she should be taking for COVID. Patient had a virtual last week on 01/22/2020 and was given the antiviral mediation and still has symptoms. Symptoms are still there, not worse. She wants to know what do next. Patient has a lab appointment to be tested for covid on 02/05/2020

## 2021-02-01 NOTE — Telephone Encounter (Signed)
Patient returned Sarah's phone call.

## 2021-02-01 NOTE — Telephone Encounter (Signed)
LMTCB

## 2021-02-01 NOTE — Progress Notes (Signed)
Pt tested positive 1/4 for Covid & is still not feeling better. She has congestion that she is unable to get up, watery eyes & fatigue. She did have antiviral prescribed by Dr. Olivia Mackie 1/5.

## 2021-02-01 NOTE — Telephone Encounter (Signed)
Pt stated that she was still having symptoms of congestion that she was unable to get up & fatigue. I have scheduled patient with [facility] tomorrow.

## 2021-02-02 ENCOUNTER — Other Ambulatory Visit: Payer: Self-pay | Admitting: Family

## 2021-02-02 ENCOUNTER — Telehealth (INDEPENDENT_AMBULATORY_CARE_PROVIDER_SITE_OTHER): Payer: Medicare Other | Admitting: Family

## 2021-02-02 VITALS — Ht 60.0 in

## 2021-02-02 DIAGNOSIS — U071 COVID-19: Secondary | ICD-10-CM | POA: Diagnosis not present

## 2021-02-02 MED ORDER — AZITHROMYCIN 250 MG PO TABS: 250.0000 mg | ORAL_TABLET | Freq: Every day | ORAL | 0 refills | Status: DC

## 2021-02-02 MED ORDER — FLUTICASONE PROPIONATE 50 MCG/ACT NA SUSP: 2.0000 | Freq: Every day | NASAL | 0 refills | Status: DC

## 2021-02-02 NOTE — Telephone Encounter (Signed)
error 

## 2021-02-02 NOTE — Progress Notes (Signed)
Virtual Visit via Telephone Note  I connected with Jennifer [name] on 02/02/21 at  2:00 PM EST by telephone and verified that I am speaking with the correct person using two identifiers.  Location: Patient: Home Provider: Carman Ching   I discussed the limitations, risks, security and privacy concerns of performing an evaluation and management service by telephone and the availability of in person appointments. I also discussed with the patient that there may be a patient responsible charge related to this service. The patient expressed understanding and agreed to proceed.   History of Present Illness:71 year old female presents with c/o chest and head congestion, cough, fatigue, sore throat that has persist since she was diagnosed with COVID Jan 21, 2021. She has been taking Mucinex and Tylenol without any assistance. She is vaccinated and boosted.    Observations/Objective: A&O NAD   Assessment and Plan: Gray was seen today for lingering covid symptoms , chest congestion , fatigue and watery eyes.  Diagnoses and all orders for this visit:  COVID-19  Fatigue, unspecified type  Other orders -     fluticasone (FLONASE) 50 MCG/ACT nasal spray; Place 2 sprays into both nostrils daily. -     azithromycin (ZITHROMAX Z-PAK) 250 MG tablet; Take 1 tablet (250 mg total) by mouth daily. 2 tabs today then 1 tab daily x 4 more days.    Call the office if symptoms worsen or persist. Recheck as scheduled and sooner as needed.   Follow Up Instructions:    I discussed the assessment and treatment plan with the patient. The patient was provided an opportunity to ask questions and all were answered. The patient agreed with the plan and demonstrated an understanding of the instructions.   The patient was advised to call back or seek an in-person evaluation if the symptoms worsen or if the condition fails to improve as anticipated.  I provided 20 minutes of non-face-to-face time during  this encounter.   Kennyth Arnold, FNP

## 2021-02-04 ENCOUNTER — Telehealth: Payer: Self-pay | Admitting: Gastroenterology

## 2021-02-04 ENCOUNTER — Other Ambulatory Visit: Payer: Medicare Other

## 2021-02-04 NOTE — Telephone Encounter (Signed)
Patient called and said she did a COVID home test today and it is negative. She would like to know if she still needs to come for a COVID test and should she finish her antibodies.

## 2021-02-04 NOTE — Telephone Encounter (Signed)
Inbound call from pt requesting a call back stating that her anemia has cam back and she's out of breath when she walks from room to room. Please advise. Thank you.

## 2021-02-04 NOTE — Telephone Encounter (Signed)
I spoke with patient & she stated that since she tested negative in an at home test that she did not need swab here at our office. I have cancelled her appointment for today. She stated that she has a hematology appointment this coming Tuesday but she was feeling really weak & tired again like she has before when she was in need of iron infusion. Pt stated she gets so tired that she feels that she may pass out. I advised that in that case she needed to go to ED ASAP. I advised that we did have what patient needed here & she needed STAT labs to check her CBC etc. Pt stated that she was willing to go but unsure if she would go today. I told her that if she was feeling that poorly that she did not need to wait. Patient verbalized understanding of this.

## 2021-02-04 NOTE — Telephone Encounter (Signed)
Called patient and she stated that this week she had started to be short of breath on exertion and had noticed some blood on her stools. Patient wanted to see you tomorrow. However, I told her that because she is short of breath and noticed some blood in her stools, I recommended for her to go to the ED to be seen. I told her that if her iron is low, they would be able to give her a blood transfusion. Patient also stated that she has an appointment with Dr. Tasia Catchings on Tuesday 02/09/2021.  Please let me know if you want me to advise her different.

## 2021-02-04 NOTE — Telephone Encounter (Signed)
LMTCB

## 2021-02-05 ENCOUNTER — Encounter: Payer: Self-pay | Admitting: Emergency Medicine

## 2021-02-05 ENCOUNTER — Observation Stay
Admission: EM | Admit: 2021-02-05 | Discharge: 2021-02-06 | Disposition: A | Discharge status: Home / Self Care | Payer: Medicare Other | Attending: Internal Medicine | Admitting: Internal Medicine

## 2021-02-05 ENCOUNTER — Emergency Department: Payer: Medicare Other

## 2021-02-05 ENCOUNTER — Other Ambulatory Visit: Payer: Self-pay

## 2021-02-05 DIAGNOSIS — Z20822 Contact with and (suspected) exposure to covid-19: Secondary | ICD-10-CM | POA: Insufficient documentation

## 2021-02-05 DIAGNOSIS — K640 First degree hemorrhoids: Secondary | ICD-10-CM | POA: Diagnosis not present

## 2021-02-05 DIAGNOSIS — Z8616 Personal history of COVID-19: Secondary | ICD-10-CM | POA: Diagnosis not present

## 2021-02-05 DIAGNOSIS — E039 Hypothyroidism, unspecified: Secondary | ICD-10-CM | POA: Diagnosis not present

## 2021-02-05 DIAGNOSIS — K625 Hemorrhage of anus and rectum: Secondary | ICD-10-CM

## 2021-02-05 DIAGNOSIS — F32A Depression, unspecified: Secondary | ICD-10-CM

## 2021-02-05 DIAGNOSIS — D62 Acute posthemorrhagic anemia: Secondary | ICD-10-CM | POA: Diagnosis not present

## 2021-02-05 DIAGNOSIS — D649 Anemia, unspecified: Secondary | ICD-10-CM | POA: Diagnosis not present

## 2021-02-05 DIAGNOSIS — K921 Melena: Secondary | ICD-10-CM | POA: Diagnosis not present

## 2021-02-05 DIAGNOSIS — Z79899 Other long term (current) drug therapy: Secondary | ICD-10-CM | POA: Diagnosis not present

## 2021-02-05 DIAGNOSIS — I10 Essential (primary) hypertension: Secondary | ICD-10-CM | POA: Insufficient documentation

## 2021-02-05 DIAGNOSIS — R0600 Dyspnea, unspecified: Secondary | ICD-10-CM | POA: Diagnosis not present

## 2021-02-05 DIAGNOSIS — K922 Gastrointestinal hemorrhage, unspecified: Secondary | ICD-10-CM

## 2021-02-05 DIAGNOSIS — K649 Unspecified hemorrhoids: Secondary | ICD-10-CM | POA: Diagnosis not present

## 2021-02-05 DIAGNOSIS — Z87891 Personal history of nicotine dependence: Secondary | ICD-10-CM | POA: Diagnosis not present

## 2021-02-05 DIAGNOSIS — R7989 Other specified abnormal findings of blood chemistry: Secondary | ICD-10-CM

## 2021-02-05 DIAGNOSIS — Z9884 Bariatric surgery status: Secondary | ICD-10-CM | POA: Diagnosis not present

## 2021-02-05 DIAGNOSIS — R0602 Shortness of breath: Secondary | ICD-10-CM | POA: Diagnosis not present

## 2021-02-05 DIAGNOSIS — R42 Dizziness and giddiness: Secondary | ICD-10-CM | POA: Diagnosis not present

## 2021-02-05 HISTORY — DX: Anemia, unspecified: D64.9

## 2021-02-05 LAB — HEPATIC FUNCTION PANEL
ALT: 51 U/L — ABNORMAL HIGH (ref 0–44)
AST: 115 U/L — ABNORMAL HIGH (ref 15–41)
Albumin: 3.8 g/dL (ref 3.5–5.0)
Alkaline Phosphatase: 73 U/L (ref 38–126)
Bilirubin, Direct: <0.1 mg/dL (ref 0.0–0.2)
Total Bilirubin: 0.4 mg/dL (ref 0.3–1.2)
Total Protein: 6.4 g/dL — ABNORMAL LOW (ref 6.5–8.1)

## 2021-02-05 LAB — RESP PANEL BY RT-PCR (FLU A&B, COVID) ARPGX2
Influenza A by PCR: NEGATIVE
Influenza B by PCR: NEGATIVE
SARS Coronavirus 2 by RT PCR: NEGATIVE

## 2021-02-05 LAB — CBC
HCT: 19 % — ABNORMAL LOW (ref 36.0–46.0)
Hemoglobin: 5.9 g/dL — ABNORMAL LOW (ref 12.0–15.0)
MCH: 23.9 pg — ABNORMAL LOW (ref 26.0–34.0)
MCHC: 31.1 g/dL (ref 30.0–36.0)
MCV: 76.9 fL — ABNORMAL LOW (ref 80.0–100.0)
Platelets: 281 10*3/uL (ref 150–400)
RBC: 2.47 MIL/uL — ABNORMAL LOW (ref 3.87–5.11)
RDW: 21.6 % — ABNORMAL HIGH (ref 11.5–15.5)
WBC: 6.7 10*3/uL (ref 4.0–10.5)
nRBC: 0.4 % — ABNORMAL HIGH (ref 0.0–0.2)

## 2021-02-05 LAB — BASIC METABOLIC PANEL
Anion gap: 9 (ref 5–15)
BUN: 7 mg/dL — ABNORMAL LOW (ref 8–23)
CO2: 24 mmol/L (ref 22–32)
Calcium: 8.9 mg/dL (ref 8.9–10.3)
Chloride: 96 mmol/L — ABNORMAL LOW (ref 98–111)
Creatinine, Ser: 0.59 mg/dL (ref 0.44–1.00)
GFR, Estimated: >60 mL/min (ref >60)
Glucose, Bld: 114 mg/dL — ABNORMAL HIGH (ref 70–99)
Potassium: 4.1 mmol/L (ref 3.5–5.1)
Sodium: 129 mmol/L — ABNORMAL LOW (ref 135–145)

## 2021-02-05 LAB — PROTIME-INR
INR: 1 (ref 0.8–1.2)
Prothrombin Time: 13.4 seconds (ref 11.4–15.2)

## 2021-02-05 LAB — TROPONIN I (HIGH SENSITIVITY): Troponin I (High Sensitivity): 4 ng/L (ref <18)

## 2021-02-05 LAB — APTT: aPTT: 23 seconds — ABNORMAL LOW (ref 24–36)

## 2021-02-05 LAB — MAGNESIUM: Magnesium: 2.2 mg/dL (ref 1.7–2.4)

## 2021-02-05 LAB — PREPARE RBC (CROSSMATCH)

## 2021-02-05 MED ORDER — SERTRALINE HCL 50 MG PO TABS
100.0000 mg | ORAL_TABLET | Freq: Every day | ORAL | Status: DC
  Administered 2021-02-05: 100 mg via ORAL
  Filled 2021-02-05: qty 2

## 2021-02-05 MED ORDER — GABAPENTIN 300 MG PO CAPS
300.0000 mg | ORAL_CAPSULE | Freq: Two times a day (BID) | ORAL | Status: DC
  Administered 2021-02-05 – 2021-02-06 (×2): 300 mg via ORAL
  Filled 2021-02-05 (×2): qty 1

## 2021-02-05 MED ORDER — SODIUM CHLORIDE 0.9% FLUSH: 3.0000 mL | INTRAVENOUS | Status: DC | PRN

## 2021-02-05 MED ORDER — HYDROCOD POLI-CHLORPHE POLI ER 10-8 MG/5ML PO SUER: 5.0000 mL | Freq: Every evening | ORAL | Status: DC | PRN

## 2021-02-05 MED ORDER — SODIUM CHLORIDE 0.9% FLUSH
3.0000 mL | Freq: Two times a day (BID) | INTRAVENOUS | Status: DC
  Administered 2021-02-05 (×2): 3 mL via INTRAVENOUS

## 2021-02-05 MED ORDER — SODIUM CHLORIDE 0.9 % IV SOLN
10.0000 mL/h | Freq: Once | INTRAVENOUS | Status: AC
  Administered 2021-02-05: 10 mL/h via INTRAVENOUS

## 2021-02-05 MED ORDER — HYDROCORTISONE ACETATE 25 MG RE SUPP
25.0000 mg | Freq: Two times a day (BID) | RECTAL | Status: DC
  Administered 2021-02-06: 25 mg via RECTAL
  Filled 2021-02-05 (×4): qty 1

## 2021-02-05 MED ORDER — SODIUM CHLORIDE 0.9 % IV SOLN: 250.0000 mL | INTRAVENOUS | Status: DC | PRN

## 2021-02-05 NOTE — Progress Notes (Signed)
Patient c/o leg cramps and leg pain. States she usually takes gabapentin at home and it helps. MD notified.   Patient also asking about diet order. MD and GI messaged regarding diet order status.

## 2021-02-05 NOTE — ED Provider Notes (Addendum)
[facility] Provider Note    Event Date/Time   First MD Initiated Contact with Patient 02/05/21 1048     (approximate)   History   Shortness of Breath   HPI  Semaja Lymon is a 71 y.o. female who reports that she has been getting short of breath with exertion for the last week or so.  She is also been having bright red blood per rectum for the last week or so.  She says her internal hemorrhoids are bleeding.  Patient was seen here in November with similar symptoms and received blood transfusion.  She had a colonoscopy at that time which did not show anything but nonbleeding internal hemorrhoids.  Upper endoscopy also done at that time was normal.  Has history significant for previous GI bleed with transfusion and colonoscopy and upper GI series Past Medical History:  Diagnosis Date   Anemia    Anxiety    Bariatric surgery status    Constipation    COVID-19    Dysrhythmia    Elevated liver enzymes    GERD (gastroesophageal reflux disease)    Hemorrhoids    Herpes genitalis    High cholesterol    Hyperlipidemia    Hypertension    Hypothyroidism    Neuropathy    Osteoarthritis    Sleep apnea     Physical Exam   Triage Vital Signs: ED Triage Vitals  Enc Vitals Group     BP 02/05/21 0811 128/61     Pulse Rate 02/05/21 0811 82     Resp 02/05/21 0811 16     Temp 02/05/21 0811 98.8 F (37.1 C)     Temp Source 02/05/21 0811 Oral     SpO2 02/05/21 0811 97 %     Weight 02/05/21 0808 185 lb 13.6 oz (84.3 kg)     Height 02/05/21 0808 5' (1.524 m)     Head Circumference --      Peak Flow --      Pain Score 02/05/21 0808 0     Pain Loc --      Pain Edu? --      Excl. in [place]? --     Most recent vital signs: Vitals:   02/05/21 1230 02/05/21 1238  BP: (!) 116/50 (!) 124/55  Pulse: 83 78  Resp:  16  Temp:  98.8 F (37.1 C)  SpO2: 100%      General: Awake, no distress.  CV:  Good peripheral perfusion.  Heart regular  rate and rhythm with no audible murmur Resp:  Normal effort.  Lungs are clear Abd:  No distention.  Abdomen is soft nontender no organomegaly Other:  Extremities with no edema   ED Results / Procedures / Treatments   Labs (all labs ordered are listed, but only abnormal results are displayed) Labs Reviewed  BASIC METABOLIC PANEL - Abnormal; Notable for the following components:      Result Value   Sodium 129 (*)    Chloride 96 (*)    Glucose, Bld 114 (*)    BUN 7 (*)    All other components within normal limits  CBC - Abnormal; Notable for the following components:   RBC 2.47 (*)    Hemoglobin 5.9 (*)    HCT 19.0 (*)    MCV 76.9 (*)    MCH 23.9 (*)    RDW 21.6 (*)    nRBC 0.4 (*)    All other components within normal limits  HEPATIC FUNCTION  PANEL - Abnormal; Notable for the following components:   Total Protein 6.4 (*)    AST 115 (*)    ALT 51 (*)    All other components within normal limits  APTT - Abnormal; Notable for the following components:   aPTT 23 (*)    All other components within normal limits  PROTIME-INR  TYPE AND SCREEN  PREPARE RBC (CROSSMATCH)  TROPONIN I (HIGH SENSITIVITY)     EKG  EKG read interpreted by me shows normal sinus rhythm at a rate of 79 normal axis no acute ST-T wave changes   RADIOLOGY  Chest x-ray read by radiology reviewed by me shows no acute changes  PROCEDURES:  Critical Care performed: Critical care time 15 minutes.  This includes reviewing the records talking to the patient doing a rectal exam and speaking with the hospitalist.  Procedures   MEDICATIONS ORDERED IN ED: Medications  0.9 %  sodium chloride infusion (10 mL/hr Intravenous New Bag/Given 02/05/21 1224)     IMPRESSION / MDM / [place] / ED COURSE  I reviewed the triage vital signs and the nursing notes.                              Differential diagnosis includes, but is not limited to, patient shortness of breath and it might have been due  to CHF or pneumonia or cardiac disease.  Her cardiac disease is still possible in view of her stable EKG and chest x-ray this is very unlikely. The patient is on the cardiac monitor to evaluate for evidence of arrhythmia and/or significant heart rate changes. Patient likely is having a repeat of her previous episode of GI bleeding in November where she got to be so anemic that she became short of breath.  Her H&H this time is lower than previously.  An incidental finding it is her sodium is low at 129.  I have discussed transfusion with her and the risks and benefits.  She remembers this from last time.  We will have to get her transfused and likely another colonoscopy will be done to make sure there is no other bleeding.  She may be having a arteriovenous malformation in the colon wall which is bleeding.  These are typically very hard to detect.  Past medical history reviewed to include the previous colonoscopy and upper GI series and her labs over the last 6 months. Discussed in detail with hospitalist reviewed all the studies with them.     FINAL CLINICAL IMPRESSION(S) / ED DIAGNOSES   Final diagnoses:  Symptomatic anemia  Gastrointestinal hemorrhage, unspecified gastrointestinal hemorrhage type  Dyspnea, unspecified type     Rx / DC Orders   ED Discharge Orders     None        Note:  This document was prepared using Dragon voice recognition software and may include unintentional dictation errors.   Nena Polio, MD 02/05/21 1252    Nena Polio, MD 02/05/21 [phone]

## 2021-02-05 NOTE — ED Notes (Signed)
RN to bedside to bedside to introduce self to pt.  Pt advised she has been having blood in her stools x 1 week. She has had this happen before and had to receive a blood transfusion in November of last year as well. She called her GI MD and they told her to come here yesterday but she could not come. So she came today instead.

## 2021-02-05 NOTE — Consult Note (Signed)
Inpatient Consultation   Patient ID: Jennifer [name] is a 71 y.o. female.  Requesting Provider: Derrill Kay, MD  Date of Admission: 02/05/2021  Date of Consult: 02/05/21   Reason for Consultation: anemia, hematochezia  Patient's Chief Complaint:   Chief Complaint  Patient presents with   Shortness of Breath    71 year old African-American female with history of GI bleeding, bariatric surgery, and internal hemorrhoids who presents to the hospital with worsening shortness of breath and lightheadedness.  Gastroenterology is consulted for hematochezia and anemia.  Patient noted an increasing amount of bright red blood per rectum over the last week.  No abdominal pain nausea vomiting hematemesis or coffee-ground emesis.  Patient does take iron supplementation resulting in some dark stools.  Her hemoglobin on January 3 was 10.7.  On presentation today her hemoglobin is 5.9 with noted iron deficiency.  Pt actively receiving prbc unit 1 of 2 upon evaluation. Still feels short of breath, but improving.  Recently on amoxicillin and doxycycline for dental procedure and upper respiratory infection.  Denies NSAIDs, Anti-plt agents, and anticoagulants Denies family history of gastrointestinal disease and malignancy Previous Endoscopies: November 2022- EGD and Colonoscopy Normal egd with known previous findings of bariatric surgery Colon- poor prep, however, brown stool noted throughout with [facility]    Past Medical History:  Diagnosis Date   Anemia    Anxiety    Bariatric surgery status    Constipation    COVID-19    Dysrhythmia    Elevated liver enzymes    GERD (gastroesophageal reflux disease)    Hemorrhoids    Herpes genitalis    High cholesterol    Hyperlipidemia    Hypertension    Hypothyroidism    Neuropathy    Osteoarthritis    Sleep apnea     Past Surgical History:  Procedure Laterality Date   ABDOMINAL HYSTERECTOMY     total for fibroids no h/o abnormal pap    bariatric sleeve  2015   BREAST EXCISIONAL BIOPSY Left 1998   carpal tunnel repair     COLONOSCOPY WITH PROPOFOL N/A 02/11/2016   Procedure: COLONOSCOPY WITH PROPOFOL;  Surgeon: Jonathon Bellows, MD;  Location: [facility] ENDOSCOPY;  Service: Endoscopy;  Laterality: N/A;   COLONOSCOPY WITH PROPOFOL N/A 10/01/2019   Procedure: COLONOSCOPY WITH PROPOFOL;  Surgeon: Jonathon Bellows, MD;  Location: [facility] ENDOSCOPY;  Service: Gastroenterology;  Laterality: N/A;   COLONOSCOPY WITH PROPOFOL N/A 11/19/2020   Procedure: COLONOSCOPY WITH PROPOFOL;  Surgeon: Jonathon Bellows, MD;  Location: [name] ENDOSCOPY;  Service: Gastroenterology;  Laterality: N/A;   ESOPHAGOGASTRODUODENOSCOPY (EGD) WITH PROPOFOL N/A 12/02/2019   Procedure: ESOPHAGOGASTRODUODENOSCOPY (EGD) WITH PROPOFOL;  Surgeon: Jonathon Bellows, MD;  Location: [facility] ENDOSCOPY;  Service: Gastroenterology;  Laterality: N/A;   ESOPHAGOGASTRODUODENOSCOPY (EGD) WITH PROPOFOL N/A 11/19/2020   Procedure: ESOPHAGOGASTRODUODENOSCOPY (EGD) WITH PROPOFOL;  Surgeon: Jonathon Bellows, MD;  Location: [facility] ENDOSCOPY;  Service: Gastroenterology;  Laterality: N/A;   HEMORRHOID SURGERY      Allergies  Allergen Reactions   Celecoxib Hives   Hydrocodone Nausea Only   Pollen Extract    Sodium Ferric Gluconate [Ferrous Gluconate]     IV ferric gluconate - shortly after the infusion developed back pain shooting into left arm and bilateral hand swelling   Nasacort [Triamcinolone] Other (See Comments)    Nasal - Nose Bleeds   Vicodin [Hydrocodone-Acetaminophen] Nausea Only    Family History  Problem Relation Age of Onset   Breast cancer Sister 65       materal 1/2 sister  Hypertension Sister    Hypertension Mother    Heart disease Father    Hypertension Brother     Social History   Tobacco Use   Smoking status: Former    Packs/day: 1.50    Years: 24.00    Pack years: 36.00    Types: Cigarettes    Quit date: 1993    Years since quitting: 30.0   Smokeless tobacco: Never   Tobacco  comments:    quit 1995.   Vaping Use   Vaping Use: Never used  Substance Use Topics   Alcohol use: Not Currently   Drug use: No     Pertinent GI related history and allergies were reviewed with the patient  Review of Systems  Constitutional:  Positive for fatigue. Negative for activity change, appetite change, chills, diaphoresis, fever and unexpected weight change.  HENT:  Negative for trouble swallowing and voice change.   Respiratory:  Positive for shortness of breath. Negative for wheezing.   Cardiovascular:  Negative for chest pain, palpitations and leg swelling.  Gastrointestinal:  Positive for blood in stool and constipation. Negative for abdominal distention, abdominal pain, anal bleeding, diarrhea, nausea, rectal pain and vomiting.  Musculoskeletal:  Negative for arthralgias and myalgias.  Skin:  Negative for color change and pallor.  Neurological:  Positive for dizziness, weakness and light-headedness. Negative for syncope.  Psychiatric/Behavioral:  Negative for confusion.   All other systems reviewed and are negative.   Medications Home Medications No current facility-administered medications on file prior to encounter.   Current Outpatient Medications on File Prior to Encounter  Medication Sig Dispense Refill   acetaminophen (TYLENOL) 325 MG tablet Take 2 tablets (650 mg total) by mouth every 6 (six) hours as needed for mild pain (or Fever >/= 101). (Patient taking differently: Take 2,000 mg by mouth every 6 (six) hours as needed for mild pain (or Fever >/= 101).)     acyclovir (ZOVIRAX) 400 MG tablet Take 400 mg by mouth daily.     Ascorbic Acid (VITAMIN C PO) Take by mouth.     azithromycin (ZITHROMAX Z-PAK) 250 MG tablet Take 1 tablet (250 mg total) by mouth daily. 2 tabs today then 1 tab daily x 4 more days. 6 tablet 0   calcium citrate (CALCITRATE - DOSED IN MG ELEMENTAL CALCIUM) 950 (200 Ca) MG tablet Take 200 mg of elemental calcium by mouth daily.     gabapentin  (NEURONTIN) 300 MG capsule Take 1 capsule (300 mg total) by mouth 2 (two) times daily. 120 capsule 2   levothyroxine (SYNTHROID) 50 MCG tablet TAKE 1 TABLET BY MOUTH EVERY DAY 90 tablet 1   losartan (COZAAR) 100 MG tablet TAKE 1 TABLET BY MOUTH EVERY DAY AT BEDTIME 90 tablet 0   Multiple Vitamin (MULTIVITAMIN WITH MINERALS) TABS tablet Take 1 tablet by mouth daily.     Multiple Vitamins-Minerals (ZINC PO) Take by mouth.     olopatadine (PATADAY) 0.1 % ophthalmic solution Place 1 drop into both eyes 2 (two) times daily. 5 mL 12   sertraline (ZOLOFT) 100 MG tablet Take 1 tablet (100 mg total) by mouth at bedtime. 90 tablet 1   triamterene-hydrochlorothiazide (MAXZIDE-25) 37.5-25 MG tablet Take 1 tablet by mouth daily. 90 tablet 1   benzonatate (TESSALON) 200 MG capsule Take 1 capsule (200 mg total) by mouth 2 (two) times daily as needed for cough. 40 capsule 0   chlorpheniramine-HYDROcodone (TUSSIONEX PENNKINETIC ER) 10-8 MG/5ML SUER Take 5 mLs by mouth at bedtime as needed. (  Patient not taking: Reported on 02/05/2021) 115 mL 0   Dextromethorphan-Guaifenesin 60-1200 MG 12hr tablet Take 1 tablet by mouth every 12 (twelve) hours. (Patient not taking: Reported on 02/05/2021) 60 tablet 2   erythromycin ophthalmic ointment Place 1 application into both eyes 3 (three) times daily. X 4-7 days (Patient not taking: Reported on 02/05/2021) 3.5 g 0   fluticasone (FLONASE) 50 MCG/ACT nasal spray Place 1 spray into both nostrils daily.     fluticasone (FLONASE) 50 MCG/ACT nasal spray Place 2 sprays into both nostrils daily. 16 g 0   hydrocortisone (ANUSOL-HC) 25 MG suppository Place 1 suppository (25 mg total) rectally 2 (two) times daily. 12 suppository 1   levocetirizine (XYZAL) 5 MG tablet Take 2.5 mg by mouth every evening. (Patient not taking: Reported on 02/05/2021)     montelukast (SINGULAIR) 10 MG tablet Take 1 tablet (10 mg total) by mouth at bedtime. 90 tablet 1   sodium chloride (OCEAN) 0.65 % SOLN nasal  spray Place 2 sprays into both nostrils as needed for congestion. 30 mL 2   Pertinent GI related medications were reviewed with the patient  Inpatient Medications  Current Facility-Administered Medications:    0.9 %  sodium chloride infusion, 250 mL, Intravenous, PRN, Derrill Kay A, MD   sodium chloride flush (NS) 0.9 % injection 3 mL, 3 mL, Intravenous, Q12H, Derrill Kay A, MD, 3 mL at 02/05/21 1433   sodium chloride flush (NS) 0.9 % injection 3 mL, 3 mL, Intravenous, PRN, Phillips Grout, MD  Current Outpatient Medications:    acetaminophen (TYLENOL) 325 MG tablet, Take 2 tablets (650 mg total) by mouth every 6 (six) hours as needed for mild pain (or Fever >/= 101). (Patient taking differently: Take 2,000 mg by mouth every 6 (six) hours as needed for mild pain (or Fever >/= 101).), Disp: , Rfl:    acyclovir (ZOVIRAX) 400 MG tablet, Take 400 mg by mouth daily., Disp: , Rfl:    Ascorbic Acid (VITAMIN C PO), Take by mouth., Disp: , Rfl:    azithromycin (ZITHROMAX Z-PAK) 250 MG tablet, Take 1 tablet (250 mg total) by mouth daily. 2 tabs today then 1 tab daily x 4 more days., Disp: 6 tablet, Rfl: 0   calcium citrate (CALCITRATE - DOSED IN MG ELEMENTAL CALCIUM) 950 (200 Ca) MG tablet, Take 200 mg of elemental calcium by mouth daily., Disp: , Rfl:    gabapentin (NEURONTIN) 300 MG capsule, Take 1 capsule (300 mg total) by mouth 2 (two) times daily., Disp: 120 capsule, Rfl: 2   levothyroxine (SYNTHROID) 50 MCG tablet, TAKE 1 TABLET BY MOUTH EVERY DAY, Disp: 90 tablet, Rfl: 1   losartan (COZAAR) 100 MG tablet, TAKE 1 TABLET BY MOUTH EVERY DAY AT BEDTIME, Disp: 90 tablet, Rfl: 0   Multiple Vitamin (MULTIVITAMIN WITH MINERALS) TABS tablet, Take 1 tablet by mouth daily., Disp: , Rfl:    Multiple Vitamins-Minerals (ZINC PO), Take by mouth., Disp: , Rfl:    olopatadine (PATADAY) 0.1 % ophthalmic solution, Place 1 drop into both eyes 2 (two) times daily., Disp: 5 mL, Rfl: 12   sertraline (ZOLOFT) 100 MG  tablet, Take 1 tablet (100 mg total) by mouth at bedtime., Disp: 90 tablet, Rfl: 1   triamterene-hydrochlorothiazide (MAXZIDE-25) 37.5-25 MG tablet, Take 1 tablet by mouth daily., Disp: 90 tablet, Rfl: 1   benzonatate (TESSALON) 200 MG capsule, Take 1 capsule (200 mg total) by mouth 2 (two) times daily as needed for cough., Disp: 40 capsule, Rfl: 0  chlorpheniramine-HYDROcodone (TUSSIONEX PENNKINETIC ER) 10-8 MG/5ML SUER, Take 5 mLs by mouth at bedtime as needed. (Patient not taking: Reported on 02/05/2021), Disp: 115 mL, Rfl: 0   Dextromethorphan-Guaifenesin 60-1200 MG 12hr tablet, Take 1 tablet by mouth every 12 (twelve) hours. (Patient not taking: Reported on 02/05/2021), Disp: 60 tablet, Rfl: 2   erythromycin ophthalmic ointment, Place 1 application into both eyes 3 (three) times daily. X 4-7 days (Patient not taking: Reported on 02/05/2021), Disp: 3.5 g, Rfl: 0   fluticasone (FLONASE) 50 MCG/ACT nasal spray, Place 1 spray into both nostrils daily., Disp: , Rfl:    fluticasone (FLONASE) 50 MCG/ACT nasal spray, Place 2 sprays into both nostrils daily., Disp: 16 g, Rfl: 0   hydrocortisone (ANUSOL-HC) 25 MG suppository, Place 1 suppository (25 mg total) rectally 2 (two) times daily., Disp: 12 suppository, Rfl: 1   levocetirizine (XYZAL) 5 MG tablet, Take 2.5 mg by mouth every evening. (Patient not taking: Reported on 02/05/2021), Disp: , Rfl:    montelukast (SINGULAIR) 10 MG tablet, Take 1 tablet (10 mg total) by mouth at bedtime., Disp: 90 tablet, Rfl: 1   sodium chloride (OCEAN) 0.65 % SOLN nasal spray, Place 2 sprays into both nostrils as needed for congestion., Disp: 30 mL, Rfl: 2  sodium chloride      sodium chloride, sodium chloride flush   Objective   Vitals:   02/05/21 1500 02/05/21 1515 02/05/21 1530 02/05/21 1530  BP:    (!) 115/54  Pulse: 80 73 75 75  Resp:    16  Temp:    98.6 F (37 C)  TempSrc:      SpO2: 100% 100% 100% 100%  Weight:      Height:         Physical  Exam Vitals and nursing note reviewed.  Constitutional:      General: She is not in acute distress.    Appearance: Normal appearance. She is obese. She is not toxic-appearing or diaphoretic.  HENT:     Head: Normocephalic and atraumatic.     Nose: Nose normal.     Mouth/Throat:     Mouth: Mucous membranes are moist.     Pharynx: Oropharynx is clear.  Eyes:     General: No scleral icterus.    Extraocular Movements: Extraocular movements intact.  Cardiovascular:     Rate and Rhythm: Normal rate and regular rhythm.     Heart sounds: Normal heart sounds. No murmur heard.   No friction rub. No gallop.  Pulmonary:     Effort: Pulmonary effort is normal. No respiratory distress.     Breath sounds: Normal breath sounds. No wheezing, rhonchi or rales.  Abdominal:     General: Bowel sounds are normal. There is no distension.     Palpations: Abdomen is soft.     Tenderness: There is no abdominal tenderness. There is no guarding or rebound.  Musculoskeletal:     Cervical back: Neck supple.     Right lower leg: No edema.     Left lower leg: No edema.  Skin:    General: Skin is warm and dry.     Coloration: Skin is not jaundiced or pale.  Neurological:     General: No focal deficit present.     Mental Status: She is alert and oriented to person, place, and time. Mental status is at baseline.  Psychiatric:        Mood and Affect: Mood normal.        Behavior: Behavior  normal.        Thought Content: Thought content normal.        Judgment: Judgment normal.    Laboratory Data Recent Labs  Lab 02/05/21 0814  WBC 6.7  HGB 5.9*  HCT 19.0*  PLT 281   Recent Labs  Lab 02/05/21 0814  NA 129*  K 4.1  CL 96*  CO2 24  BUN 7*  CALCIUM 8.9  PROT 6.4*  BILITOT 0.4  ALKPHOS 73  ALT 51*  AST 115*  GLUCOSE 114*   Recent Labs  Lab 02/05/21 1142  INR 1.0    No results for input(s): LIPASE in the last 72 hours.      Imaging Studies: DG Chest 2 View  Result Date:  02/05/2021 CLINICAL DATA:  Dyspnea on exertion.  Dizziness for 4 days. EXAM: CHEST - 2 VIEW COMPARISON:  Radiographs 11/16/2020 and 06/09/2020. FINDINGS: The heart size and mediastinal contours are stable. The lungs are clear. There is no pleural effusion or pneumothorax. Mild degenerative changes are present within the thoracic spine. IMPRESSION: Stable chest.  No active cardiopulmonary process. Electronically Signed   By: Richardean Sale M.D.   On: 02/05/2021 08:42    Assessment:   # Lower GI Bleeding- hematochezia - suspect hemorrhoidal given recent unremarkable colonoscopy, however, ddx includes diverticular and avms  # Symptomatic acute on chronic anemia - 2/2 above - hgb 5.9 on presentation from 10.7 on 1/3  - receivign 2 u prbc (02/05/21) -Iron deficient with sat of 7% and ferritin of 20 - DOE  # h/o bariatric surgery-  -Robotic revision of gastric sleeve (2015) to [facility]  # obesity  Plan:  Plan for flexible sigmoidoscopy tomorrow for evaluation Two enemas prior to procedure CBC and BMP in am Maintain two sites IV access Monitor H&H serially no more than q8h.  Transfusion and resuscitation as per primary team.   Avoid too frequent blood draws prevent lab induced anemia Clears until midnight. NPO after midnight Hold dvt ppx Discussed with her primary GI- if hemorrhoids source of bleeding, can potentially do banding on Monday- although patient has root canal scheduled for that day  Flexible Sigmoidoscopy with possible biopsy, control of bleeding, polypectomy, and interventions as necessary has been discussed with the patient/patient representative. Informed consent was obtained from the patient/patient representative after explaining the indication, nature, and risks of the procedure including but not limited to death, bleeding, perforation, missed neoplasm/lesions, cardiorespiratory compromise, and reaction to medications. Opportunity for  questions was given and appropriate answers were provided. Patient/patient representative has verbalized understanding is amenable to undergoing the procedure.   I personally performed the service.  Management of other medical comorbidities as per primary team  Thank you for allowing [name] to participate in this patient's care. Please don't hesitate to call if any questions or concerns arise.   [name], DO [facility]  Portions of the record may have been created with voice recognition software. Occasional wrong-word or 'sound-a-like' substitutions may have occurred due to the inherent limitations of voice recognition software.  Read the chart carefully and recognize, using context, where substitutions may have occurred.

## 2021-02-05 NOTE — ED Triage Notes (Signed)
C/O SOB with walking. States had similar symptoms in November, and has history of anemia and required a blood transfusion at that time.  Symptoms have been ongoing x 3-4 days.

## 2021-02-05 NOTE — H&P (Signed)
History and Physical    Kamaree Berkel MVH:[id number] DOB: 1950/06/01 DOA: 02/05/2021  PCP: [name], FNP  Patient coming from: home  Chief Complaint: brbpr  HPI: Sharilynn Cassity is a 71 y.o. female with medical history significant of recurrent gib, bariatric surgery, internal hemorrhoids, htn comes in with 4 days of dizzy and doe.  No fevers.  No cough.  No n/v/d.  Has had brbpr after a bout of constipation last week.   No abd pain.  Hgb drop to 5 from previous of 10.  Bp stable.  Referred for admit for acute blood loss anemia due to gi bleed.  Review of Systems: As per HPI otherwise 10 point review of systems negative.   Past Medical History:  Diagnosis Date   Anemia    Anxiety    Bariatric surgery status    Constipation    COVID-19    Dysrhythmia    Elevated liver enzymes    GERD (gastroesophageal reflux disease)    Hemorrhoids    Herpes genitalis    High cholesterol    Hyperlipidemia    Hypertension    Hypothyroidism    Neuropathy    Osteoarthritis    Sleep apnea     Past Surgical History:  Procedure Laterality Date   ABDOMINAL HYSTERECTOMY     total for fibroids no h/o abnormal pap   bariatric sleeve  2015   BREAST EXCISIONAL BIOPSY Left 1998   carpal tunnel repair     COLONOSCOPY WITH PROPOFOL N/A 02/11/2016   Procedure: COLONOSCOPY WITH PROPOFOL;  Surgeon: Jonathon Bellows, MD;  Location: [facility] ENDOSCOPY;  Service: Endoscopy;  Laterality: N/A;   COLONOSCOPY WITH PROPOFOL N/A 10/01/2019   Procedure: COLONOSCOPY WITH PROPOFOL;  Surgeon: Jonathon Bellows, MD;  Location: [facility] ENDOSCOPY;  Service: Gastroenterology;  Laterality: N/A;   COLONOSCOPY WITH PROPOFOL N/A 11/19/2020   Procedure: COLONOSCOPY WITH PROPOFOL;  Surgeon: Jonathon Bellows, MD;  Location: [facility] ENDOSCOPY;  Service: Gastroenterology;  Laterality: N/A;   ESOPHAGOGASTRODUODENOSCOPY (EGD) WITH PROPOFOL N/A 12/02/2019   Procedure: ESOPHAGOGASTRODUODENOSCOPY (EGD) WITH PROPOFOL;  Surgeon: Jonathon Bellows, MD;   Location: [facility] ENDOSCOPY;  Service: Gastroenterology;  Laterality: N/A;   ESOPHAGOGASTRODUODENOSCOPY (EGD) WITH PROPOFOL N/A 11/19/2020   Procedure: ESOPHAGOGASTRODUODENOSCOPY (EGD) WITH PROPOFOL;  Surgeon: Jonathon Bellows, MD;  Location: [facility] ENDOSCOPY;  Service: Gastroenterology;  Laterality: N/A;   HEMORRHOID SURGERY       reports that she quit smoking about 30 years ago. Her smoking use included cigarettes. She has a 36.00 pack-year smoking history. She has never used smokeless tobacco. She reports that she does not currently use alcohol. She reports that she does not use drugs.  Allergies  Allergen Reactions   Celecoxib Hives   Hydrocodone Nausea Only   Pollen Extract    Sodium Ferric Gluconate [Ferrous Gluconate]     IV ferric gluconate - shortly after the infusion developed back pain shooting into left arm and bilateral hand swelling   Nasacort [Triamcinolone] Other (See Comments)    Nasal - Nose Bleeds   Vicodin [Hydrocodone-Acetaminophen] Nausea Only    Family History  Problem Relation Age of Onset   Breast cancer Sister 72       materal 1/2 sister   Hypertension Sister    Hypertension Mother    Heart disease Father    Hypertension Brother     Prior to Admission medications   Medication Sig Start Date End Date Taking? Authorizing Provider  acetaminophen (TYLENOL) 325 MG tablet Take 2 tablets (650 mg total) by mouth  every 6 (six) hours as needed for mild pain (or Fever >/= 101). Patient taking differently: Take 2,000 mg by mouth every 6 (six) hours as needed for mild pain (or Fever >/= 101). 06/12/20   Nolberto Hanlon, MD  acyclovir (ZOVIRAX) 400 MG tablet Take 400 mg by mouth daily. 07/13/20   [provider]  Ascorbic Acid (VITAMIN C PO) Take by mouth.    [provider]  azithromycin (ZITHROMAX Z-PAK) 250 MG tablet Take 1 tablet (250 mg total) by mouth daily. 2 tabs today then 1 tab daily x 4 more days. 02/02/21   Kennyth Arnold, FNP  benzonatate (TESSALON) 200  MG capsule Take 1 capsule (200 mg total) by mouth 2 (two) times daily as needed for cough. 01/21/21   McLean-Scocuzza, Nino Glow, MD  calcium citrate (CALCITRATE - DOSED IN MG ELEMENTAL CALCIUM) 950 (200 Ca) MG tablet Take 200 mg of elemental calcium by mouth daily.    [provider]  chlorpheniramine-HYDROcodone (TUSSIONEX PENNKINETIC ER) 10-8 MG/5ML SUER Take 5 mLs by mouth at bedtime as needed. 01/21/21   McLean-Scocuzza, Nino Glow, MD  Dextromethorphan-Guaifenesin 60-1200 MG 12hr tablet Take 1 tablet by mouth every 12 (twelve) hours. 01/21/21   McLean-Scocuzza, Nino Glow, MD  erythromycin ophthalmic ointment Place 1 application into both eyes 3 (three) times daily. X 4-7 days 01/21/21   McLean-Scocuzza, Nino Glow, MD  fluticasone (FLONASE) 50 MCG/ACT nasal spray Place 1 spray into both nostrils daily.    [provider]  fluticasone (FLONASE) 50 MCG/ACT nasal spray Place 2 sprays into both nostrils daily. 02/02/21   Kennyth Arnold, FNP  gabapentin (NEURONTIN) 300 MG capsule Take 1 capsule (300 mg total) by mouth 2 (two) times daily. 11/11/20   [name], FNP  hydrocortisone (ANUSOL-HC) 25 MG suppository Place 1 suppository (25 mg total) rectally 2 (two) times daily. 11/11/20   [name], FNP  levocetirizine (XYZAL) 5 MG tablet Take 2.5 mg by mouth every evening.    [provider]  levothyroxine (SYNTHROID) 50 MCG tablet TAKE 1 TABLET BY MOUTH EVERY DAY 09/23/20   [name], FNP  losartan (COZAAR) 100 MG tablet TAKE 1 TABLET BY MOUTH EVERY DAY AT BEDTIME 11/23/20   [name], FNP  montelukast (SINGULAIR) 10 MG tablet Take 1 tablet (10 mg total) by mouth at bedtime. 01/19/21   [name], FNP  Multiple Vitamin (MULTIVITAMIN WITH MINERALS) TABS tablet Take 1 tablet by mouth daily.    [provider]  Multiple Vitamins-Minerals (ZINC PO) Take by mouth.    [provider]  olopatadine (PATADAY) 0.1 % ophthalmic solution Place 1 drop  into both eyes 2 (two) times daily. 01/21/21   McLean-Scocuzza, Nino Glow, MD  sertraline (ZOLOFT) 100 MG tablet Take 1 tablet (100 mg total) by mouth at bedtime. 11/11/20   [name], FNP  sodium chloride (OCEAN) 0.65 % SOLN nasal spray Place 2 sprays into both nostrils as needed for congestion. 01/21/21   McLean-Scocuzza, Nino Glow, MD  triamterene-hydrochlorothiazide (MAXZIDE-25) 37.5-25 MG tablet Take 1 tablet by mouth daily. 01/19/21   [name], FNP    Physical Exam: Vitals:   02/05/21 1219 02/05/21 1230 02/05/21 1238 02/05/21 1245  BP: (!) 106/51 (!) 116/50 (!) 124/55 (!) 124/55  Pulse: 80 83 78 80  Resp: 16  16 16   Temp: 98.7 F (37.1 C)  98.8 F (37.1 C) 98.8 F (37.1 C)  TempSrc: Oral  Oral Oral  SpO2: 100%  100%  100%  Weight:      Height:          Constitutional: NAD, calm, comfortable Vitals:   02/05/21 1219 02/05/21 1230 02/05/21 1238 02/05/21 1245  BP: (!) 106/51 (!) 116/50 (!) 124/55 (!) 124/55  Pulse: 80 83 78 80  Resp: 16  16 16   Temp: 98.7 F (37.1 C)  98.8 F (37.1 C) 98.8 F (37.1 C)  TempSrc: Oral  Oral Oral  SpO2: 100% 100%  100%  Weight:      Height:       Eyes: PERRL, lids and conjunctivae normal ENMT: Mucous membranes are moist. Posterior pharynx clear of any exudate or lesions.Normal dentition.  Neck: normal, supple, no masses, no thyromegaly Respiratory: clear to auscultation bilaterally, no wheezing, no crackles. Normal respiratory effort. No accessory muscle use.  Cardiovascular: Regular rate and rhythm, no murmurs / rubs / gallops. No extremity edema. 2+ pedal pulses. No carotid bruits.  Abdomen: no tenderness, no masses palpated. No hepatosplenomegaly. Bowel sounds positive.  Musculoskeletal: no clubbing / cyanosis. No joint deformity upper and lower extremities. Good ROM, no contractures. Normal muscle tone.  Skin: no rashes, lesions, ulcers. No induration Neurologic: CN 2-12 grossly intact. Sensation intact, DTR normal. Strength  5/5 in all 4.  Psychiatric: Normal judgment and insight. Alert and oriented x 3. Normal mood.    Labs on Admission: I have personally reviewed following labs and imaging studies  CBC: Recent Labs  Lab 02/05/21 0814  WBC 6.7  HGB 5.9*  HCT 19.0*  MCV 76.9*  PLT 510   Basic Metabolic Panel: Recent Labs  Lab 02/05/21 0814  NA 129*  K 4.1  CL 96*  CO2 24  GLUCOSE 114*  BUN 7*  CREATININE 0.59  CALCIUM 8.9   GFR: Estimated Creatinine Clearance: 62.1 mL/min (by C-G formula based on SCr of 0.59 mg/dL). Liver Function Tests: Recent Labs  Lab 02/05/21 0814  AST 115*  ALT 51*  ALKPHOS 73  BILITOT 0.4  PROT 6.4*  ALBUMIN 3.8   No results for input(s): LIPASE, AMYLASE in the last 168 hours. No results for input(s): AMMONIA in the last 168 hours. Coagulation Profile: Recent Labs  Lab 02/05/21 1142  INR 1.0   Cardiac Enzymes: No results for input(s): CKTOTAL, CKMB, CKMBINDEX, TROPONINI in the last 168 hours. BNP (last 3 results) No results for input(s): PROBNP in the last 8760 hours. HbA1C: No results for input(s): HGBA1C in the last 72 hours. CBG: No results for input(s): GLUCAP in the last 168 hours. Lipid Profile: No results for input(s): CHOL, HDL, LDLCALC, TRIG, CHOLHDL, LDLDIRECT in the last 72 hours. Thyroid Function Tests: No results for input(s): TSH, T4TOTAL, FREET4, T3FREE, THYROIDAB in the last 72 hours. Anemia Panel: No results for input(s): VITAMINB12, FOLATE, FERRITIN, TIBC, IRON, RETICCTPCT in the last 72 hours. Urine analysis:    Component Value Date/Time   COLORURINE YELLOW (A) 06/10/2020 0000   APPEARANCEUR CLEAR (A) 06/10/2020 0000   LABSPEC 1.023 06/10/2020 0000   PHURINE 6.0 06/10/2020 0000   GLUCOSEU NEGATIVE 06/10/2020 0000   GLUCOSEU NEGATIVE 03/03/2017 0830   HGBUR NEGATIVE 06/10/2020 0000   BILIRUBINUR NEGATIVE 06/10/2020 0000   KETONESUR NEGATIVE 06/10/2020 0000   PROTEINUR NEGATIVE 06/10/2020 0000   UROBILINOGEN 0.2  03/03/2017 0830   NITRITE NEGATIVE 06/10/2020 0000   LEUKOCYTESUR NEGATIVE 06/10/2020 0000   Sepsis Labs: !!!!!!!!!!!!!!!!!!!!!!!!!!!!!!!!!!!!!!!!!!!! @LABRCNTIP (procalcitonin:4,lacticidven:4) )No results found for this or any previous visit (from the past 240 hour(s)).   Radiological Exams on  Admission: DG Chest 2 View  Result Date: 02/05/2021 CLINICAL DATA:  Dyspnea on exertion.  Dizziness for 4 days. EXAM: CHEST - 2 VIEW COMPARISON:  Radiographs 11/16/2020 and 06/09/2020. FINDINGS: The heart size and mediastinal contours are stable. The lungs are clear. There is no pleural effusion or pneumothorax. Mild degenerative changes are present within the thoracic spine. IMPRESSION: Stable chest.  No active cardiopulmonary process. Electronically Signed   By: Richardean Sale M.D.   On: 02/05/2021 08:42    Old chart reviewed Case discussed with edp Case discussed with gi doc  Assessment/Plan  71 yo female with recurrent lgib  Active Problems:    Symptomatic anemia- likely from internal hemorrhoids.  Has had recent scoping with no other source identified.  Gi consult.  Transfuse 2 units prbc.  Hold antiplatelet and AC meds.  Clear liq diet for potential scoping in the next day or so.    GI bleed- as above.  Dr Virgina Jock aware with gi team    Essential hypertension- hold bp meds at this time    History of bariatric surgery- noted    Hypothyroidism- noted    Further recs pending overall hospital course   DVT prophylaxis: scds  Code Status: full  Family Communication: none  Disposition Plan: 1-3 days  Consults called: GI  Admission status: admission    Amier Hoyt A MD [facility]  If 7PM-7AM, please contact night-coverage [email or web address] Password [facility]  02/05/2021, 12:50 PM

## 2021-02-06 ENCOUNTER — Inpatient Hospital Stay: Payer: Medicare Other | Admitting: Anesthesiology

## 2021-02-06 ENCOUNTER — Encounter
Admission: EM | Disposition: A | Discharge status: Home / Self Care | Payer: Self-pay | Source: Home / Self Care | Attending: Emergency Medicine

## 2021-02-06 ENCOUNTER — Encounter: Payer: Self-pay | Admitting: Family Medicine

## 2021-02-06 DIAGNOSIS — I10 Essential (primary) hypertension: Secondary | ICD-10-CM

## 2021-02-06 DIAGNOSIS — K625 Hemorrhage of anus and rectum: Secondary | ICD-10-CM

## 2021-02-06 DIAGNOSIS — F32A Depression, unspecified: Secondary | ICD-10-CM | POA: Diagnosis not present

## 2021-02-06 DIAGNOSIS — R7989 Other specified abnormal findings of blood chemistry: Secondary | ICD-10-CM | POA: Diagnosis not present

## 2021-02-06 DIAGNOSIS — K649 Unspecified hemorrhoids: Secondary | ICD-10-CM | POA: Diagnosis not present

## 2021-02-06 DIAGNOSIS — D62 Acute posthemorrhagic anemia: Secondary | ICD-10-CM | POA: Diagnosis present

## 2021-02-06 DIAGNOSIS — K921 Melena: Secondary | ICD-10-CM | POA: Diagnosis not present

## 2021-02-06 DIAGNOSIS — D649 Anemia, unspecified: Secondary | ICD-10-CM | POA: Diagnosis not present

## 2021-02-06 DIAGNOSIS — K640 First degree hemorrhoids: Secondary | ICD-10-CM | POA: Diagnosis not present

## 2021-02-06 DIAGNOSIS — E039 Hypothyroidism, unspecified: Secondary | ICD-10-CM

## 2021-02-06 HISTORY — PX: FLEXIBLE SIGMOIDOSCOPY: SHX5431

## 2021-02-06 LAB — TYPE AND SCREEN
ABO/RH(D): O POS
Antibody Screen: NEGATIVE
Unit division: 0
Unit division: 0

## 2021-02-06 LAB — BASIC METABOLIC PANEL
Anion gap: 9 (ref 5–15)
BUN: 6 mg/dL — ABNORMAL LOW (ref 8–23)
CO2: 28 mmol/L (ref 22–32)
Calcium: 9 mg/dL (ref 8.9–10.3)
Chloride: 97 mmol/L — ABNORMAL LOW (ref 98–111)
Creatinine, Ser: 0.64 mg/dL (ref 0.44–1.00)
GFR, Estimated: >60 mL/min (ref >60)
Glucose, Bld: 105 mg/dL — ABNORMAL HIGH (ref 70–99)
Potassium: 3.4 mmol/L — ABNORMAL LOW (ref 3.5–5.1)
Sodium: 134 mmol/L — ABNORMAL LOW (ref 135–145)

## 2021-02-06 LAB — BPAM RBC
Blood Product Expiration Date: 202302152359
Blood Product Expiration Date: 202302152359
ISSUE DATE / TIME: 202301201211
ISSUE DATE / TIME: 202301201826
Unit Type and Rh: 5100
Unit Type and Rh: 5100

## 2021-02-06 LAB — CBC
HCT: 27.5 % — ABNORMAL LOW (ref 36.0–46.0)
Hemoglobin: 8.9 g/dL — ABNORMAL LOW (ref 12.0–15.0)
MCH: 26.6 pg (ref 26.0–34.0)
MCHC: 32.4 g/dL (ref 30.0–36.0)
MCV: 82.1 fL (ref 80.0–100.0)
Platelets: 278 10*3/uL (ref 150–400)
RBC: 3.35 MIL/uL — ABNORMAL LOW (ref 3.87–5.11)
RDW: 19.9 % — ABNORMAL HIGH (ref 11.5–15.5)
WBC: 6.2 10*3/uL (ref 4.0–10.5)
nRBC: 0 % (ref 0.0–0.2)

## 2021-02-06 LAB — GLUCOSE, CAPILLARY: Glucose-Capillary: 134 mg/dL — ABNORMAL HIGH (ref 70–99)

## 2021-02-06 SURGERY — SIGMOIDOSCOPY, FLEXIBLE
Anesthesia: General

## 2021-02-06 MED ORDER — PROPOFOL 10 MG/ML IV BOLUS
INTRAVENOUS | Status: DC | PRN
Start: 2021-02-06 — End: 2021-02-06
  Administered 2021-02-06: 30 mg via INTRAVENOUS
  Administered 2021-02-06: 20 mg via INTRAVENOUS

## 2021-02-06 MED ORDER — LIDOCAINE HCL (CARDIAC) PF 100 MG/5ML IV SOSY
PREFILLED_SYRINGE | INTRAVENOUS | Status: DC | PRN
  Administered 2021-02-06: 40 mg via INTRAVENOUS

## 2021-02-06 MED ORDER — LEVOTHYROXINE SODIUM 50 MCG PO TABS
50.0000 ug | ORAL_TABLET | Freq: Every day | ORAL | Status: DC
  Administered 2021-02-06: 50 ug via ORAL
  Filled 2021-02-06: qty 1

## 2021-02-06 MED ORDER — ONDANSETRON HCL 4 MG/2ML IJ SOLN: 4.0000 mg | Freq: Once | INTRAMUSCULAR | Status: DC | PRN

## 2021-02-06 MED ORDER — SODIUM CHLORIDE 0.9 % IV SOLN
400.0000 mg | Freq: Once | INTRAVENOUS | Status: AC
  Administered 2021-02-06: 400 mg via INTRAVENOUS
  Filled 2021-02-06: qty 20

## 2021-02-06 MED ORDER — PROPOFOL 500 MG/50ML IV EMUL
INTRAVENOUS | Status: DC | PRN
  Administered 2021-02-06: 100 ug/kg/min via INTRAVENOUS

## 2021-02-06 MED ORDER — ACETAMINOPHEN 325 MG PO TABS
650.0000 mg | ORAL_TABLET | Freq: Once | ORAL | Status: AC
  Administered 2021-02-06: 650 mg via ORAL
  Filled 2021-02-06: qty 2

## 2021-02-06 MED ORDER — PROPOFOL 10 MG/ML IV BOLUS
INTRAVENOUS | Status: AC
  Filled 2021-02-06: qty 40

## 2021-02-06 MED ORDER — SODIUM CHLORIDE 0.9 % IV SOLN: INTRAVENOUS | Status: DC

## 2021-02-06 NOTE — Progress Notes (Signed)
Tap water enema given as per md's orders, small amt results from SS enema noted. Pt tol well.

## 2021-02-06 NOTE — Transfer of Care (Signed)
Immediate Anesthesia Transfer of Care Note  Patient: Jennifer [name]  Procedure(s) Performed: FLEXIBLE SIGMOIDOSCOPY  Patient Location: PACU  Anesthesia Type:General  Level of Consciousness: awake  Airway & Oxygen Therapy: Patient Spontanous Breathing  Post-op Assessment: Report given to RN  Post vital signs: stable  Last Vitals:  Vitals Value Taken Time  BP 106/58   Temp 98.2   Pulse 68   Resp 20   SpO2 100     Last Pain:  Vitals:   02/06/21 0344  TempSrc: Oral  PainSc:       Patients Stated Pain Goal: 0 (98/47/30 8569)  Complications: No notable events documented.

## 2021-02-06 NOTE — Care Management CC44 (Signed)
Condition Code 44 Documentation Completed  Patient Details  Name: Jennifer [name] MRN: [id number] Date of Birth: 1950/08/04   Condition Code 44 given:  Yes Patient signature on Condition Code 44 notice:  Yes Documentation of 2 MD's agreement:  Yes Code 44 added to claim:  Yes Spoke with patient and explained form/claim adjustment possibilities.   Izola Price, RN 02/06/2021, 2:34 PM

## 2021-02-06 NOTE — Anesthesia Postprocedure Evaluation (Signed)
Anesthesia Post Note  Patient: Jennifer [name]  Procedure(s) Performed: [place]  Patient location during evaluation: PACU Anesthesia Type: General Level of consciousness: awake and alert Pain management: pain level controlled Vital Signs Assessment: post-procedure vital signs reviewed and stable Respiratory status: spontaneous breathing, nonlabored ventilation, respiratory function stable and patient connected to nasal cannula oxygen Cardiovascular status: blood pressure returned to baseline and stable Postop Assessment: no apparent nausea or vomiting Anesthetic complications: no   No notable events documented.   Last Vitals:  Vitals:   02/06/21 0900 02/06/21 0915  BP: 109/61 119/61  Pulse: 67 67  Resp: 16 16  Temp:  36.7 C  SpO2: 100% 100%    Last Pain:  Vitals:   02/06/21 1024  TempSrc:   PainSc: 0-No pain                 Arita Miss

## 2021-02-06 NOTE — Anesthesia Preprocedure Evaluation (Signed)
Anesthesia Evaluation  Patient identified by MRN, date of birth, ID band Patient awake    Reviewed: Allergy & Precautions, NPO status , Patient's Chart, lab work & pertinent test results  History of Anesthesia Complications Negative for: history of anesthetic complications  Airway Mallampati: II  TM Distance: <3 FB Neck ROM: full    Dental  (+) Teeth Intact   Pulmonary asthma , sleep apnea , neg COPD, Patient abstained from smoking.Not current smoker, former smoker,    Pulmonary exam normal  + decreased breath sounds      Cardiovascular Exercise Tolerance: Poor METS: 3 - Mets hypertension, Pt. on medications (-) CAD and (-) Past MI Normal cardiovascular exam+ dysrhythmias  Rhythm:Regular Rate:Normal  Hx bradycardia, patient says specialists have told her she doesn't need a pacemaker yet   Neuro/Psych  Headaches, PSYCHIATRIC DISORDERS Anxiety Depression    GI/Hepatic Neg liver ROS, GERD  Medicated,  Endo/Other  negative endocrine ROSneg diabetesHypothyroidism   Renal/GU negative Renal ROS  negative genitourinary   Musculoskeletal  (+) Arthritis ,   Abdominal (+) + obese,   Peds negative pediatric ROS (+)  Hematology  (+) Blood dyscrasia, anemia ,   Anesthesia Other Findings Past Medical History: No date: Anxiety No date: Bariatric surgery status No date: Constipation No date: Dysrhythmia No date: Elevated liver enzymes No date: GERD (gastroesophageal reflux disease) No date: Hemorrhoids No date: Herpes genitalis No date: High cholesterol No date: Hyperlipidemia No date: Hypertension No date: Hypothyroidism No date: Neuropathy No date: Osteoarthritis No date: Sleep apnea  Past Surgical History: No date: ABDOMINAL HYSTERECTOMY     Comment:  total for fibroids no h/o abnormal pap 2015: bariatric sleeve 1998: BREAST EXCISIONAL BIOPSY; Left No date: carpal tunnel repair 02/11/2016: COLONOSCOPY WITH  PROPOFOL; N/A     Comment:  Procedure: COLONOSCOPY WITH PROPOFOL;  Surgeon: Jonathon Bellows, MD;  Location: [facility] ENDOSCOPY;  Service: Endoscopy;              Laterality: N/A; 10/01/2019: COLONOSCOPY WITH PROPOFOL; N/A     Comment:  Procedure: COLONOSCOPY WITH PROPOFOL;  Surgeon: Jonathon Bellows, MD;  Location: [facility] ENDOSCOPY;  Service:               Gastroenterology;  Laterality: N/A; 12/02/2019: ESOPHAGOGASTRODUODENOSCOPY (EGD) WITH PROPOFOL; N/A     Comment:  Procedure: ESOPHAGOGASTRODUODENOSCOPY (EGD) WITH               PROPOFOL;  Surgeon: Jonathon Bellows, MD;  Location: [facility]               ENDOSCOPY;  Service: Gastroenterology;  Laterality: N/A; No date: HEMORRHOID SURGERY  BMI    Body Mass Index: 33.99 kg/m      Reproductive/Obstetrics negative OB ROS                             Anesthesia Physical  Anesthesia Plan  ASA: 3  Anesthesia Plan: General   Post-op Pain Management: Minimal or no pain anticipated   Induction: Intravenous  PONV Risk Score and Plan: 3 and Propofol infusion and TIVA  Airway Management Planned: Nasal Cannula  Additional Equipment: None  Intra-op Plan:   Post-operative Plan:   Informed Consent: I have reviewed the patients History and Physical, chart, labs and discussed the procedure including the risks, benefits  and alternatives for the proposed anesthesia with the patient or authorized representative who has indicated his/her understanding and acceptance.     Dental Advisory Given  Plan Discussed with: CRNA and Surgeon  Anesthesia Plan Comments: (Discussed risks of anesthesia with patient, including possibility of difficulty with spontaneous ventilation under anesthesia necessitating airway intervention, PONV, and rare risks such as cardiac or respiratory or neurological events, and allergic reactions. Discussed the role of CRNA in patient's perioperative care. Patient understands.)         Anesthesia Quick Evaluation

## 2021-02-06 NOTE — Discharge Instructions (Addendum)
Do not take aspirin, Advil, Motrin, Aleve, Goody powder, BC powder or any other anti-inflammatory.  Tylenol 650 mg every 6 hours as needed for pain.  (Do not take more than 4000 mg in a day of Tylenol)  You received IV venofer here in the hospital

## 2021-02-06 NOTE — Interval H&P Note (Signed)
History and Physical Interval Note: Preprocedure H&P from 02/06/21  was reviewed and there was no interval change after seeing and examining the patient.  Written consent was obtained from the patient after discussion of risks, benefits, and alternatives. Patient has consented to proceed with Flexible Sigmoidoscopy with possible intervention   02/06/2021 8:36 AM  Jennifer [name]  has presented today for surgery, with the diagnosis of hematochezia, hemorrhoids.  The various methods of treatment have been discussed with the patient and family. After consideration of risks, benefits and other options for treatment, the patient has consented to  Procedure(s): FLEXIBLE SIGMOIDOSCOPY (N/A) as a surgical intervention.  The patient's history has been reviewed, patient examined, no change in status, stable for surgery.  I have reviewed the patient's chart and labs.  Questions were answered to the patient's satisfaction.     [name]

## 2021-02-06 NOTE — Progress Notes (Signed)
Patient being discharged home. Discharge paperwork reviewed with patient. Patient verbalized full understanding. Patients husband is patients ride home.

## 2021-02-06 NOTE — Progress Notes (Signed)
Inpatient Follow-up/Progress Note   Patient ID: Jennifer [name] is a 71 y.o. female.  Overnight Events / Subjective Findings NAEON. Bleeding has stopped. Rcd two enemas and clear liquid coming out. Flex sig today. Npo since midnight. S/p 2 u prbc with appropriate rise in hgb. No other acute gi complaints.  Review of Systems  Constitutional:  Negative for activity change, appetite change, chills, diaphoresis, fatigue, fever and unexpected weight change.  HENT:  Negative for trouble swallowing and voice change.   Respiratory:  Negative for shortness of breath and wheezing.   Cardiovascular:  Negative for chest pain, palpitations and leg swelling.  Gastrointestinal:  Positive for blood in stool (has stopped). Negative for abdominal distention, abdominal pain, anal bleeding, constipation, diarrhea, nausea, rectal pain and vomiting.  Musculoskeletal:  Negative for arthralgias and myalgias.  Skin:  Negative for color change and pallor.  Neurological:  Negative for dizziness, syncope and weakness.  Psychiatric/Behavioral:  Negative for confusion.   All other systems reviewed and are negative.   Medications  Current Facility-Administered Medications:    0.9 %  sodium chloride infusion, 250 mL, Intravenous, PRN, Derrill Kay A, MD   0.9 %  sodium chloride infusion, , Intravenous, Continuous, [name], DO   chlorpheniramine-HYDROcodone 10-8 MG/5ML suspension 5 mL, 5 mL, Oral, QHS PRN, Phillips Grout, MD   gabapentin (NEURONTIN) capsule 300 mg, 300 mg, Oral, BID, Phillips Grout, MD, 300 mg at 02/05/21 2104   hydrocortisone (ANUSOL-HC) suppository 25 mg, 25 mg, Rectal, BID, [name], DO   levothyroxine (SYNTHROID) tablet 50 mcg, 50 mcg, Oral, Q0600, Loletha Grayer, MD   sertraline (ZOLOFT) tablet 100 mg, 100 mg, Oral, QHS, Sharion Settler, NP, 100 mg at 02/05/21 2227   sodium chloride flush (NS) 0.9 % injection 3 mL, 3 mL, Intravenous, Q12H, Derrill Kay A,  MD, 3 mL at 02/05/21 2200   sodium chloride flush (NS) 0.9 % injection 3 mL, 3 mL, Intravenous, PRN, Derrill Kay A, MD  sodium chloride     sodium chloride      sodium chloride, chlorpheniramine-HYDROcodone, sodium chloride flush   Objective    Vitals:   02/05/21 1845 02/05/21 2133 02/06/21 0344 02/06/21 0726  BP: (!) 113/57 (!) 135/56 124/64 127/64  Pulse: 78 77 69 77  Resp: 18 16 20 18   Temp: 98.6 F (37 C) 98.4 F (36.9 C) 98.4 F (36.9 C) 98.7 F (37.1 C)  TempSrc: Oral Oral Oral   SpO2: 99% 99% 100% 97%  Weight:      Height:         Physical Exam Vitals and nursing note reviewed.  Constitutional:      General: She is not in acute distress.    Appearance: Normal appearance. She is obese. She is not ill-appearing, toxic-appearing or diaphoretic.  HENT:     Head: Normocephalic and atraumatic.     Nose: Nose normal.     Mouth/Throat:     Mouth: Mucous membranes are moist.     Pharynx: Oropharynx is clear.  Eyes:     General: No scleral icterus.    Extraocular Movements: Extraocular movements intact.  Cardiovascular:     Rate and Rhythm: Normal rate and regular rhythm.     Heart sounds: Normal heart sounds. No murmur heard.   No friction rub. No gallop.  Pulmonary:     Effort: Pulmonary effort is normal. No respiratory distress.     Breath sounds: Normal breath sounds. No wheezing, rhonchi or rales.  Abdominal:     General: Bowel sounds are normal. There is no distension.     Palpations: Abdomen is soft.     Tenderness: There is no abdominal tenderness. There is no guarding or rebound.  Musculoskeletal:     Cervical back: Neck supple.     Right lower leg: No edema.     Left lower leg: No edema.  Skin:    General: Skin is warm and dry.     Coloration: Skin is not jaundiced or pale.  Neurological:     General: No focal deficit present.     Mental Status: She is alert and oriented to person, place, and time. Mental status is at baseline.  Psychiatric:         Mood and Affect: Mood normal.        Behavior: Behavior normal.        Thought Content: Thought content normal.        Judgment: Judgment normal.     Laboratory Data Recent Labs  Lab 02/05/21 0814 02/06/21 0416  WBC 6.7 6.2  HGB 5.9* 8.9*  HCT 19.0* 27.5*  PLT 281 278   Recent Labs  Lab 02/05/21 0814 02/06/21 0416  NA 129* 134*  K 4.1 3.4*  CL 96* 97*  CO2 24 28  BUN 7* 6*  CREATININE 0.59 0.64  CALCIUM 8.9 9.0  PROT 6.4*  --   BILITOT 0.4  --   ALKPHOS 73  --   ALT 51*  --   AST 115*  --   GLUCOSE 114* 105*   Recent Labs  Lab 02/05/21 1142  INR 1.0      Imaging Studies: DG Chest 2 View  Result Date: 02/05/2021 CLINICAL DATA:  Dyspnea on exertion.  Dizziness for 4 days. EXAM: CHEST - 2 VIEW COMPARISON:  Radiographs 11/16/2020 and 06/09/2020. FINDINGS: The heart size and mediastinal contours are stable. The lungs are clear. There is no pleural effusion or pneumothorax. Mild degenerative changes are present within the thoracic spine. IMPRESSION: Stable chest.  No active cardiopulmonary process. Electronically Signed   By: Richardean Sale M.D.   On: 02/05/2021 08:42    Assessment:   # Lower GI Bleeding- hematochezia - suspect hemorrhoidal given recent unremarkable colonoscopy, however, ddx includes diverticular and avms   # Symptomatic acute on chronic anemia - 2/2 above - hgb 5.9 on presentation from 10.7 on 1/3             - received 2 u prbc (02/05/21) with appropriate rise to 8.9 -Iron deficient with sat of 7% and ferritin of 20 - DOE  # transaminitis - fatty liver disease - AST:ALT >2:1 ratio; pt does note glass of wine each night - additionally hypotensive with blood loss - suspect multifactorial due to above   # h/o bariatric surgery-  -Robotic revision of gastric sleeve (2015) to [place]   # obesity    Plan:  Flexible sigmoidoscopy today Npo since midnight Received two enemas this morning and  reports no further bleeding Labs reviewed- hgb improved with 2 u prbc transfusion Maintain two sites IV access Monitor H&H serially no more than q8h.  Transfusion and resuscitation as per primary team.   Avoid too frequent blood draws prevent lab induced anemia Hold dvt ppx Discussed with her primary GI- if hemorrhoids source of bleeding, can potentially do banding on Monday- although patient has root canal scheduled for that day Advised about alcohol avoidance Recheck liver enzymes with primary  care or GI outpatient to ensure downward trend Further recommendations pending endoscopy- please see post op report for further information  Flexible Sigmoidoscopy with possible biopsy, control of bleeding, polypectomy, and interventions as necessary has been discussed with the patient/patient representative. Informed consent was obtained from the patient/patient representative after explaining the indication, nature, and risks of the procedure including but not limited to death, bleeding, perforation, missed neoplasm/lesions, cardiorespiratory compromise, and reaction to medications. Opportunity for questions was given and appropriate answers were provided. Patient/patient representative has verbalized understanding is amenable to undergoing the procedure.  I personally performed the service.  Management of other medical comorbidities as per primary team  Thank you for allowing [name] to participate in this patient's care. Please don't hesitate to call if any questions or concerns arise.   [name], DO [facility]  Portions of the record may have been created with voice recognition software. Occasional wrong-word or 'sound-a-like' substitutions may have occurred due to the inherent limitations of voice recognition software.  Read the chart carefully and recognize, using context, where substitutions may have occurred.

## 2021-02-06 NOTE — TOC Progression Note (Signed)
Transition of Care [facility]) - Progression Note    Patient Details  Name: Omolara Carol MRN: [id number] Date of Birth: [birth year]  Transition of Care [facility]) CM/SW Contact  Izola Price, RN Phone Number: 02/06/2021, 11:25 AM  Clinical Narrative:  1/12: Patient likely to be discharged today after procedure. Spoke with patient and she was coherent and oriented post procedure. Explained possible claims adjustment (Code-44), if she is discharged today. Patient says she is getting an Iron infusion prior to discharge. Simmie Davies RN CM        Barriers to Discharge: Barriers Resolved  Expected Discharge Plan and Services                           DME Arranged: N/A DME Agency: NA       HH Arranged: NA HH Agency: NA         Social Determinants of Health (SDOH) Interventions    Readmission Risk Interventions No flowsheet data found.

## 2021-02-06 NOTE — Op Note (Signed)
[facility] Patient Name: Jennifer [name] Procedure Date: 02/06/2021 8:35 AM MRN: [id number] Account #: [id number] Date of Birth: [birth year] Admit Type: Inpatient Age: 71 Room: [facility] ENDO ROOM 4 Gender: Female Note Status: Finalized Instrument Name: Peds Colonoscope [id number] Procedure:             Flexible Sigmoidoscopy Indications:           Hematochezia Providers:             Rueben Bash, DO Referring MD:          Yvetta Coder. Arnett (Referring MD) Medicines:             Monitored Anesthesia Care Complications:         No immediate complications. Estimated blood loss: None. Procedure:             Pre-Anesthesia Assessment:                        - Prior to the procedure, a History and Physical was                         performed, and patient medications and allergies were                         reviewed. The patient is competent. The risks and                         benefits of the procedure and the sedation options and                         risks were discussed with the patient. All questions                         were answered and informed consent was obtained.                         Patient identification and proposed procedure were                         verified by the physician, the nurse, the anesthetist                         and the technician in the endoscopy suite. Mental                         Status Examination: alert and oriented. Airway                         Examination: normal oropharyngeal airway and neck                         mobility. Respiratory Examination: clear to                         auscultation. CV Examination: RRR, no murmurs, no S3                         or S4. Prophylactic Antibiotics: The patient does not  require prophylactic antibiotics. Prior                         Anticoagulants: The patient has taken no previous                         anticoagulant or  antiplatelet agents. ASA Grade                         Assessment: III - A patient with severe systemic                         disease. After reviewing the risks and benefits, the                         patient was deemed in satisfactory condition to                         undergo the procedure. The anesthesia plan was to use                         monitored anesthesia care (MAC). Immediately prior to                         administration of medications, the patient was                         re-assessed for adequacy to receive sedatives. The                         heart rate, respiratory rate, oxygen saturations,                         blood pressure, adequacy of pulmonary ventilation, and                         response to care were monitored throughout the                         procedure. The physical status of the patient was                         re-assessed after the procedure.                        After obtaining informed consent, the scope was passed                         under direct vision. The Colonoscope was introduced                         through the anus and advanced to the the descending                         colon. The flexible sigmoidoscopy was accomplished                         without difficulty. The patient tolerated the  procedure well. The quality of the bowel preparation                         was poor. Findings:      The perianal and digital rectal examinations were normal. Pertinent       negatives include normal sphincter tone.      Non-bleeding internal hemorrhoids were found during retroflexion. The       hemorrhoids were Grade I (internal hemorrhoids that do not prolapse).      The exam was otherwise without abnormality. Impression:            - Preparation of the colon was poor.                        - Non-bleeding internal hemorrhoids.                        - The examination was otherwise normal.                         - No specimens collected. Recommendation:        - Return patient to hospital ward for ongoing care.                        - Resume regular diet.                        - No aspirin, ibuprofen, naproxen, or other                         non-steroidal anti-inflammatory drugs.                        - Anusol suppositories twice daily for 10 days                        - Return to GI clinic at appointment to be scheduled.                         Dr. Georgeann Oppenheim office will call                        - The findings and recommendations were discussed with                         the patient's family.                        - The findings and recommendations were discussed with                         the patient. Procedure Code(s):     --- Professional ---                        [id number], Sigmoidoscopy, flexible; diagnostic, including                         collection of specimen(s) by brushing or washing, when  performed (separate procedure) Diagnosis Code(s):     --- Professional ---                        K64.0, First degree hemorrhoids                        K92.1, Melena (includes Hematochezia) CPT copyright 2019 [employer]. All rights reserved. The codes documented in this report are preliminary and upon coder review may  be revised to meet current compliance requirements. Attending Participation:      I personally performed the entire procedure. Volney American, DO [name] DO, DO 02/06/2021 9:01:02 AM This report has been signed electronically. Number of Addenda: 0 Note Initiated On: 02/06/2021 8:35 AM Total Procedure Duration: 0 hours 5 minutes 27 seconds  Estimated Blood Loss:  Estimated blood loss: none.      [facility]

## 2021-02-06 NOTE — Discharge Summary (Signed)
[facility] NAME: Jennifer [name]    MR#:  [id number]  DATE OF BIRTH:  [birth year]  DATE OF ADMISSION:  02/05/2021 ADMITTING PHYSICIAN: Loletha Grayer, MD  DATE OF DISCHARGE: 02/06/2021  PRIMARY CARE PHYSICIAN: [name], FNP    ADMISSION DIAGNOSIS:  GIB (gastrointestinal bleeding) [K92.2] Symptomatic anemia [D64.9] Gastrointestinal hemorrhage, unspecified gastrointestinal hemorrhage type [K92.2] Dyspnea, unspecified type [R06.00] Acute blood loss anemia [D62]  DISCHARGE DIAGNOSIS:  Principal Problem:   GIB (gastrointestinal bleeding) Active Problems:   Essential hypertension   History of bariatric surgery   Hypothyroidism   Symptomatic anemia   GI bleed   Acute blood loss anemia   SECONDARY DIAGNOSIS:   Past Medical History:  Diagnosis Date   Anemia    Anxiety    Bariatric surgery status    Constipation    COVID-19    Dysrhythmia    Elevated liver enzymes    GERD (gastroesophageal reflux disease)    Hemorrhoids    Herpes genitalis    High cholesterol    Hyperlipidemia    Hypertension    Hypothyroidism    Neuropathy    Osteoarthritis    Sleep apnea     HOSPITAL COURSE:   Symptomatic anemia with bright red blood per rectum.  Patient's initial hemoglobin was 5.9.  Patient was transfused a couple units of blood and hemoglobin came up to 8.9.  I did give the patient IV Venofer prior to disposition.  The patient had a flexible sigmoidoscopy by Dr. Virgina Jock which showed nonbleeding hemorrhoids on retroflexion.  Dr. Virgina Jock cleared for disposition home with follow-up with Dr. [name] as outpatient.  The patient had recent colonoscopy and endoscopy.  Can consider capsule endoscopy as outpatient if continued bleeding.  Patient advised to stop aspirin and not to take any anti-inflammatories.  Tylenol only for pain.  The patient has an appointment in a few days with the hematologist.  I advised her to keep that  appointment to set up care to watch her blood counts closely. Essential hypertension can go back on losartan as outpatient but hold Dyazide. Hypothyroidism unspecified on levothyroxine Prior history of bariatric surgery Elevated liver function test.  AST 115 and ALT 51.  Continue to monitor as outpatient Depression on Zoloft Neuropathy on gabapentin Hyponatremia on presentation sodium was 129 on presentation came up to 134.  DISCHARGE CONDITIONS:   Satisfactory  CONSULTS OBTAINED:    Gastroenterology  DRUG ALLERGIES:   Allergies  Allergen Reactions   Celecoxib Hives   Hydrocodone Nausea Only   Pollen Extract    Sodium Ferric Gluconate [Ferrous Gluconate]     IV ferric gluconate - shortly after the infusion developed back pain shooting into left arm and bilateral hand swelling   Nasacort [Triamcinolone] Other (See Comments)    Nasal - Nose Bleeds   Vicodin [Hydrocodone-Acetaminophen] Nausea Only    DISCHARGE MEDICATIONS:   Allergies as of 02/06/2021       Reactions   Celecoxib Hives   Hydrocodone Nausea Only   Pollen Extract    Sodium Ferric Gluconate [ferrous Gluconate]    IV ferric gluconate - shortly after the infusion developed back pain shooting into left arm and bilateral hand swelling   Nasacort [triamcinolone] Other (See Comments)   Nasal - Nose Bleeds   Vicodin [hydrocodone-acetaminophen] Nausea Only        Medication List     STOP taking these medications    azithromycin 250 MG tablet Commonly known  as: Zithromax Z-Pak   benzonatate 200 MG capsule Commonly known as: TESSALON   chlorpheniramine-HYDROcodone 10-8 MG/5ML Suer Commonly known as: Tussionex Pennkinetic ER   Dextromethorphan-Guaifenesin 60-1200 MG 12hr tablet   erythromycin ophthalmic ointment   levocetirizine 5 MG tablet Commonly known as: XYZAL   triamterene-hydrochlorothiazide 37.5-25 MG tablet Commonly known as: MAXZIDE-25       TAKE these medications    acetaminophen  325 MG tablet Commonly known as: TYLENOL Take 2 tablets (650 mg total) by mouth every 6 (six) hours as needed for mild pain (or Fever >/= 101). What changed: how much to take   acyclovir 400 MG tablet Commonly known as: ZOVIRAX Take 400 mg by mouth daily.   calcium citrate 950 (200 Ca) MG tablet Commonly known as: CALCITRATE - dosed in mg elemental calcium Take 200 mg of elemental calcium by mouth daily.   fluticasone 50 MCG/ACT nasal spray Commonly known as: FLONASE Place 2 sprays into both nostrils daily. What changed: Another medication with the same name was removed. Continue taking this medication, and follow the directions you see here.   gabapentin 300 MG capsule Commonly known as: NEURONTIN Take 1 capsule (300 mg total) by mouth 2 (two) times daily.   hydrocortisone 25 MG suppository Commonly known as: ANUSOL-HC Place 1 suppository (25 mg total) rectally 2 (two) times daily.   levothyroxine 50 MCG tablet Commonly known as: SYNTHROID TAKE 1 TABLET BY MOUTH EVERY DAY   losartan 100 MG tablet Commonly known as: COZAAR TAKE 1 TABLET BY MOUTH EVERY DAY AT BEDTIME   montelukast 10 MG tablet Commonly known as: SINGULAIR Take 1 tablet (10 mg total) by mouth at bedtime.   multivitamin with minerals Tabs tablet Take 1 tablet by mouth daily.   olopatadine 0.1 % ophthalmic solution Commonly known as: Pataday Place 1 drop into both eyes 2 (two) times daily.   sertraline 100 MG tablet Commonly known as: ZOLOFT Take 1 tablet (100 mg total) by mouth at bedtime.   sodium chloride 0.65 % Soln nasal spray Commonly known as: OCEAN Place 2 sprays into both nostrils as needed for congestion.   VITAMIN C PO Take by mouth.   ZINC PO Take by mouth.         DISCHARGE INSTRUCTIONS:   Follow-up PMD 5 days Keep appointment with hematologist Follow-up Dr. [name] gastroenterology  If you experience worsening of your admission symptoms, develop shortness of breath, life  threatening emergency, suicidal or homicidal thoughts you must seek medical attention immediately by calling 911 or calling your MD immediately  if symptoms less severe.  You Must read complete instructions/literature along with all the possible adverse reactions/side effects for all the Medicines you take and that have been prescribed to you. Take any new Medicines after you have completely understood and accept all the possible adverse reactions/side effects.   Please note  You were cared for by a hospitalist during your hospital stay. If you have any questions about your discharge medications or the care you received while you were in the hospital after you are discharged, you can call the unit and asked to speak with the hospitalist on call if the hospitalist that took care of you is not available. Once you are discharged, your primary care physician will handle any further medical issues. Please note that NO REFILLS for any discharge medications will be authorized once you are discharged, as it is imperative that you return to your primary care physician (or establish a relationship with a  primary care physician if you do not have one) for your aftercare needs so that they can reassess your need for medications and monitor your lab values.    Today   CHIEF COMPLAINT:   Chief Complaint  Patient presents with   Shortness of Breath    HISTORY OF PRESENT ILLNESS:  Jennifer [name]  is a 71 y.o. female admitted with shortness of breath and found to have severe anemia   VITAL SIGNS:  Blood pressure 137/76, pulse 92, temperature (!) 97.3 F (36.3 C), temperature source Oral, resp. rate 16, height 5' (1.524 m), weight 84.3 kg, SpO2 100 %.  I/O:   Intake/Output Summary (Last 24 hours) at 02/06/2021 1545 Last data filed at 02/06/2021 0851 Gross per 24 hour  Intake 1196.13 ml  Output --  Net 1196.13 ml    PHYSICAL EXAMINATION:  GENERAL:  71 y.o.-year-old patient lying in the bed with  no acute distress.  EYES: Pupils equal, round, reactive to light and accommodation. No scleral icterus.  HEENT: Head atraumatic, normocephalic. Oropharynx and nasopharynx clear.  LUNGS: Normal breath sounds bilaterally, no wheezing, rales,rhonchi or crepitation. No use of accessory muscles of respiration.  CARDIOVASCULAR: S1, S2 normal. No murmurs, rubs, or gallops.  ABDOMEN: Soft, non-tender, non-distended.  EXTREMITIES: No pedal edema.  NEUROLOGIC: Cranial nerves II through XII are intact. Muscle strength 5/5 in all extremities. Sensation intact. Gait not checked.  PSYCHIATRIC: The patient is alert and oriented x 3.  SKIN: No obvious rash, lesion, or ulcer.   DATA REVIEW:   CBC Recent Labs  Lab 02/06/21 0416  WBC 6.2  HGB 8.9*  HCT 27.5*  PLT 278    Chemistries  Recent Labs  Lab 02/05/21 0814 02/06/21 0416  NA 129* 134*  K 4.1 3.4*  CL 96* 97*  CO2 24 28  GLUCOSE 114* 105*  BUN 7* 6*  CREATININE 0.59 0.64  CALCIUM 8.9 9.0  MG 2.2  --   AST 115*  --   ALT 51*  --   ALKPHOS 73  --   BILITOT 0.4  --      Microbiology Results  Results for orders placed or performed during the hospital encounter of 02/05/21  Resp Panel by RT-PCR (Flu A&B, Covid) Nasopharyngeal Swab     Status: None   Collection Time: 02/05/21  2:55 PM   Specimen: Nasopharyngeal Swab; Nasopharyngeal(NP) swabs in vial transport medium  Result Value Ref Range Status   SARS Coronavirus 2 by RT PCR NEGATIVE NEGATIVE Final    Comment: (NOTE) SARS-CoV-2 target nucleic acids are NOT DETECTED.  The SARS-CoV-2 RNA is generally detectable in upper respiratory specimens during the acute phase of infection. The lowest concentration of SARS-CoV-2 viral copies this assay can detect is 138 copies/mL. A negative result does not preclude SARS-Cov-2 infection and should not be used as the sole basis for treatment or other patient management decisions. A negative result may occur with  improper specimen  collection/handling, submission of specimen other than nasopharyngeal swab, presence of viral mutation(s) within the areas targeted by this assay, and inadequate number of viral copies(<138 copies/mL). A negative result must be combined with clinical observations, patient history, and epidemiological information. The expected result is Negative.  Fact Sheet for Patients:  EntrepreneurPulse.com.au  Fact Sheet for Healthcare Providers:  IncredibleEmployment.be  This test is no t yet approved or cleared by the [place] FDA and  has been authorized for detection and/or diagnosis of SARS-CoV-2 by FDA under an Emergency Use Authorization (EUA).  This EUA will remain  in effect (meaning this test can be used) for the duration of the COVID-19 declaration under Section 564(b)(1) of the Act, 21 U.S.C.section 360bbb-3(b)(1), unless the authorization is terminated  or revoked sooner.       Influenza A by PCR NEGATIVE NEGATIVE Final   Influenza B by PCR NEGATIVE NEGATIVE Final    Comment: (NOTE) The Xpert Xpress SARS-CoV-2/FLU/RSV plus assay is intended as an aid in the diagnosis of influenza from Nasopharyngeal swab specimens and should not be used as a sole basis for treatment. Nasal washings and aspirates are unacceptable for Xpert Xpress SARS-CoV-2/FLU/RSV testing.  Fact Sheet for Patients: EntrepreneurPulse.com.au  Fact Sheet for Healthcare Providers: IncredibleEmployment.be  This test is not yet approved or cleared by the [place] FDA and has been authorized for detection and/or diagnosis of SARS-CoV-2 by FDA under an Emergency Use Authorization (EUA). This EUA will remain in effect (meaning this test can be used) for the duration of the COVID-19 declaration under Section 564(b)(1) of the Act, 21 U.S.C. section 360bbb-3(b)(1), unless the authorization is terminated or revoked.  Performed at [facility], [address] [place],  [address]     RADIOLOGY:  DG Chest 2 View  Result Date: 02/05/2021 CLINICAL DATA:  Dyspnea on exertion.  Dizziness for 4 days. EXAM: CHEST - 2 VIEW COMPARISON:  Radiographs 11/16/2020 and 06/09/2020. FINDINGS: The heart size and mediastinal contours are stable. The lungs are clear. There is no pleural effusion or pneumothorax. Mild degenerative changes are present within the thoracic spine. IMPRESSION: Stable chest.  No active cardiopulmonary process. Electronically Signed   By: Richardean Sale M.D.   On: 02/05/2021 08:42       Management plans discussed with the patient, and she is in agreement.  CODE STATUS:     Code Status Orders  (From admission, onward)           Start     Ordered   02/05/21 1314  Full code  Continuous        02/05/21 1314           Code Status History     Date Active Date Inactive Code Status Order ID Comments User Context   11/17/2020 1758 11/20/2020 1655 Full Code [id number]  Karie Kirks, DO ED   06/10/2020 0320 06/12/2020 2024 Full Code [id number]  Mansy, Jan A, MD Inpatient       TOTAL TIME TAKING CARE OF THIS PATIENT: 32 minutes.    Loletha Grayer M.D on 02/06/2021 at 3:45 PM    [facility]  CC: Primary care physician; [name], FNP

## 2021-02-06 NOTE — Progress Notes (Signed)
Soap Suds enema given as per md's orders, pt tol well, instructed to hold it as long as possible

## 2021-02-06 NOTE — H&P (View-Only) (Signed)
Inpatient Follow-up/Progress Note   Patient ID: Jennifer [name] is a 71 y.o. female.  Overnight Events / Subjective Findings NAEON. Bleeding has stopped. Rcd two enemas and clear liquid coming out. Flex sig today. Npo since midnight. S/p 2 u prbc with appropriate rise in hgb. No other acute gi complaints.  Review of Systems  Constitutional:  Negative for activity change, appetite change, chills, diaphoresis, fatigue, fever and unexpected weight change.  HENT:  Negative for trouble swallowing and voice change.   Respiratory:  Negative for shortness of breath and wheezing.   Cardiovascular:  Negative for chest pain, palpitations and leg swelling.  Gastrointestinal:  Positive for blood in stool (has stopped). Negative for abdominal distention, abdominal pain, anal bleeding, constipation, diarrhea, nausea, rectal pain and vomiting.  Musculoskeletal:  Negative for arthralgias and myalgias.  Skin:  Negative for color change and pallor.  Neurological:  Negative for dizziness, syncope and weakness.  Psychiatric/Behavioral:  Negative for confusion.   All other systems reviewed and are negative.   Medications  Current Facility-Administered Medications:    0.9 %  sodium chloride infusion, 250 mL, Intravenous, PRN, Derrill Kay A, MD   0.9 %  sodium chloride infusion, , Intravenous, Continuous, [name], DO   chlorpheniramine-HYDROcodone 10-8 MG/5ML suspension 5 mL, 5 mL, Oral, QHS PRN, Phillips Grout, MD   gabapentin (NEURONTIN) capsule 300 mg, 300 mg, Oral, BID, Phillips Grout, MD, 300 mg at 02/05/21 2104   hydrocortisone (ANUSOL-HC) suppository 25 mg, 25 mg, Rectal, BID, [name], DO   levothyroxine (SYNTHROID) tablet 50 mcg, 50 mcg, Oral, Q0600, Loletha Grayer, MD   sertraline (ZOLOFT) tablet 100 mg, 100 mg, Oral, QHS, Sharion Settler, NP, 100 mg at 02/05/21 2227   sodium chloride flush (NS) 0.9 % injection 3 mL, 3 mL, Intravenous, Q12H, Derrill Kay A,  MD, 3 mL at 02/05/21 2200   sodium chloride flush (NS) 0.9 % injection 3 mL, 3 mL, Intravenous, PRN, Derrill Kay A, MD  sodium chloride     sodium chloride      sodium chloride, chlorpheniramine-HYDROcodone, sodium chloride flush   Objective    Vitals:   02/05/21 1845 02/05/21 2133 02/06/21 0344 02/06/21 0726  BP: (!) 113/57 (!) 135/56 124/64 127/64  Pulse: 78 77 69 77  Resp: 18 16 20 18   Temp: 98.6 F (37 C) 98.4 F (36.9 C) 98.4 F (36.9 C) 98.7 F (37.1 C)  TempSrc: Oral Oral Oral   SpO2: 99% 99% 100% 97%  Weight:      Height:         Physical Exam Vitals and nursing note reviewed.  Constitutional:      General: She is not in acute distress.    Appearance: Normal appearance. She is obese. She is not ill-appearing, toxic-appearing or diaphoretic.  HENT:     Head: Normocephalic and atraumatic.     Nose: Nose normal.     Mouth/Throat:     Mouth: Mucous membranes are moist.     Pharynx: Oropharynx is clear.  Eyes:     General: No scleral icterus.    Extraocular Movements: Extraocular movements intact.  Cardiovascular:     Rate and Rhythm: Normal rate and regular rhythm.     Heart sounds: Normal heart sounds. No murmur heard.   No friction rub. No gallop.  Pulmonary:     Effort: Pulmonary effort is normal. No respiratory distress.     Breath sounds: Normal breath sounds. No wheezing, rhonchi or rales.  Abdominal:     General: Bowel sounds are normal. There is no distension.     Palpations: Abdomen is soft.     Tenderness: There is no abdominal tenderness. There is no guarding or rebound.  Musculoskeletal:     Cervical back: Neck supple.     Right lower leg: No edema.     Left lower leg: No edema.  Skin:    General: Skin is warm and dry.     Coloration: Skin is not jaundiced or pale.  Neurological:     General: No focal deficit present.     Mental Status: She is alert and oriented to person, place, and time. Mental status is at baseline.  Psychiatric:         Mood and Affect: Mood normal.        Behavior: Behavior normal.        Thought Content: Thought content normal.        Judgment: Judgment normal.     Laboratory Data Recent Labs  Lab 02/05/21 0814 02/06/21 0416  WBC 6.7 6.2  HGB 5.9* 8.9*  HCT 19.0* 27.5*  PLT 281 278   Recent Labs  Lab 02/05/21 0814 02/06/21 0416  NA 129* 134*  K 4.1 3.4*  CL 96* 97*  CO2 24 28  BUN 7* 6*  CREATININE 0.59 0.64  CALCIUM 8.9 9.0  PROT 6.4*  --   BILITOT 0.4  --   ALKPHOS 73  --   ALT 51*  --   AST 115*  --   GLUCOSE 114* 105*   Recent Labs  Lab 02/05/21 1142  INR 1.0      Imaging Studies: DG Chest 2 View  Result Date: 02/05/2021 CLINICAL DATA:  Dyspnea on exertion.  Dizziness for 4 days. EXAM: CHEST - 2 VIEW COMPARISON:  Radiographs 11/16/2020 and 06/09/2020. FINDINGS: The heart size and mediastinal contours are stable. The lungs are clear. There is no pleural effusion or pneumothorax. Mild degenerative changes are present within the thoracic spine. IMPRESSION: Stable chest.  No active cardiopulmonary process. Electronically Signed   By: Richardean Sale M.D.   On: 02/05/2021 08:42    Assessment:   # Lower GI Bleeding- hematochezia - suspect hemorrhoidal given recent unremarkable colonoscopy, however, ddx includes diverticular and avms   # Symptomatic acute on chronic anemia - 2/2 above - hgb 5.9 on presentation from 10.7 on 1/3             - received 2 u prbc (02/05/21) with appropriate rise to 8.9 -Iron deficient with sat of 7% and ferritin of 20 - DOE  # transaminitis - fatty liver disease - AST:ALT >2:1 ratio; pt does note glass of wine each night - additionally hypotensive with blood loss - suspect multifactorial due to above   # h/o bariatric surgery-  -Robotic revision of gastric sleeve (2015) to [place]   # obesity    Plan:  Flexible sigmoidoscopy today Npo since midnight Received two enemas this morning and  reports no further bleeding Labs reviewed- hgb improved with 2 u prbc transfusion Maintain two sites IV access Monitor H&H serially no more than q8h.  Transfusion and resuscitation as per primary team.   Avoid too frequent blood draws prevent lab induced anemia Hold dvt ppx Discussed with her primary GI- if hemorrhoids source of bleeding, can potentially do banding on Monday- although patient has root canal scheduled for that day Advised about alcohol avoidance Recheck liver enzymes with primary  care or GI outpatient to ensure downward trend Further recommendations pending endoscopy- please see post op report for further information  Flexible Sigmoidoscopy with possible biopsy, control of bleeding, polypectomy, and interventions as necessary has been discussed with the patient/patient representative. Informed consent was obtained from the patient/patient representative after explaining the indication, nature, and risks of the procedure including but not limited to death, bleeding, perforation, missed neoplasm/lesions, cardiorespiratory compromise, and reaction to medications. Opportunity for questions was given and appropriate answers were provided. Patient/patient representative has verbalized understanding is amenable to undergoing the procedure.  I personally performed the service.  Management of other medical comorbidities as per primary team  Thank you for allowing [name] to participate in this patient's care. Please don't hesitate to call if any questions or concerns arise.   [name], DO [facility]  Portions of the record may have been created with voice recognition software. Occasional wrong-word or 'sound-a-like' substitutions may have occurred due to the inherent limitations of voice recognition software.  Read the chart carefully and recognize, using context, where substitutions may have occurred.

## 2021-02-08 ENCOUNTER — Encounter: Payer: Self-pay | Admitting: Gastroenterology

## 2021-02-08 NOTE — Telephone Encounter (Signed)
-----   Message from Jonathon Bellows, MD sent at 02/06/2021  9:00 AM EST ----- Regarding: Needs banding of hemorrhoids this week Please call patient and ask her to come in soon for hemorrhoids being this week

## 2021-02-08 NOTE — Telephone Encounter (Signed)
Called patient but had to leave her a voicemail letting her know that Dr. [name] wanted her to get a hemorrhoid banding sometime this week. I left her my direct line so she could call me and let me know if she will be able to come in this week.

## 2021-02-09 ENCOUNTER — Inpatient Hospital Stay: Payer: Medicare Other | Attending: Oncology | Admitting: Oncology

## 2021-02-09 ENCOUNTER — Inpatient Hospital Stay (HOSPITAL_BASED_OUTPATIENT_CLINIC_OR_DEPARTMENT_OTHER): Payer: Medicare Other | Admitting: Oncology

## 2021-02-09 ENCOUNTER — Encounter: Payer: Self-pay | Admitting: Oncology

## 2021-02-09 ENCOUNTER — Other Ambulatory Visit: Payer: Self-pay

## 2021-02-09 VITALS — BP 139/75 | HR 81 | Temp 97.2°F | Resp 18 | Wt 188.0 lb

## 2021-02-09 DIAGNOSIS — R42 Dizziness and giddiness: Secondary | ICD-10-CM | POA: Insufficient documentation

## 2021-02-09 DIAGNOSIS — D500 Iron deficiency anemia secondary to blood loss (chronic): Secondary | ICD-10-CM | POA: Insufficient documentation

## 2021-02-09 DIAGNOSIS — R7989 Other specified abnormal findings of blood chemistry: Secondary | ICD-10-CM | POA: Insufficient documentation

## 2021-02-09 DIAGNOSIS — R5383 Other fatigue: Secondary | ICD-10-CM | POA: Insufficient documentation

## 2021-02-09 DIAGNOSIS — Z9884 Bariatric surgery status: Secondary | ICD-10-CM | POA: Diagnosis not present

## 2021-02-09 DIAGNOSIS — Z87891 Personal history of nicotine dependence: Secondary | ICD-10-CM | POA: Insufficient documentation

## 2021-02-09 DIAGNOSIS — Z79899 Other long term (current) drug therapy: Secondary | ICD-10-CM | POA: Insufficient documentation

## 2021-02-09 DIAGNOSIS — K921 Melena: Secondary | ICD-10-CM | POA: Insufficient documentation

## 2021-02-09 DIAGNOSIS — D509 Iron deficiency anemia, unspecified: Secondary | ICD-10-CM

## 2021-02-09 DIAGNOSIS — R0609 Other forms of dyspnea: Secondary | ICD-10-CM | POA: Diagnosis not present

## 2021-02-09 DIAGNOSIS — K625 Hemorrhage of anus and rectum: Secondary | ICD-10-CM

## 2021-02-09 HISTORY — DX: Iron deficiency anemia, unspecified: D50.9

## 2021-02-09 LAB — CBC WITH DIFFERENTIAL/PLATELET
Abs Immature Granulocytes: 0.08 10*3/uL — ABNORMAL HIGH (ref 0.00–0.07)
Basophils Absolute: 0 10*3/uL (ref 0.0–0.1)
Basophils Relative: 1 %
Eosinophils Absolute: 0.1 10*3/uL (ref 0.0–0.5)
Eosinophils Relative: 1 %
HCT: 30 % — ABNORMAL LOW (ref 36.0–46.0)
Hemoglobin: 9.1 g/dL — ABNORMAL LOW (ref 12.0–15.0)
Immature Granulocytes: 1 %
Lymphocytes Relative: 27 %
Lymphs Abs: 1.6 10*3/uL (ref 0.7–4.0)
MCH: 26.1 pg (ref 26.0–34.0)
MCHC: 30.3 g/dL (ref 30.0–36.0)
MCV: 86.2 fL (ref 80.0–100.0)
Monocytes Absolute: 0.6 10*3/uL (ref 0.1–1.0)
Monocytes Relative: 10 %
Neutro Abs: 3.5 10*3/uL (ref 1.7–7.7)
Neutrophils Relative %: 60 %
Platelets: 293 10*3/uL (ref 150–400)
RBC: 3.48 MIL/uL — ABNORMAL LOW (ref 3.87–5.11)
RDW: 21.6 % — ABNORMAL HIGH (ref 11.5–15.5)
WBC: 5.9 10*3/uL (ref 4.0–10.5)
nRBC: 0.5 % — ABNORMAL HIGH (ref 0.0–0.2)

## 2021-02-09 LAB — IRON AND TIBC
Iron: 42 ug/dL (ref 28–170)
Saturation Ratios: 9 % — ABNORMAL LOW (ref 10.4–31.8)
TIBC: 468 ug/dL — ABNORMAL HIGH (ref 250–450)
UIBC: 426 ug/dL

## 2021-02-09 LAB — HEPATIC FUNCTION PANEL
ALT: 48 U/L — ABNORMAL HIGH (ref 0–44)
AST: 87 U/L — ABNORMAL HIGH (ref 15–41)
Albumin: 4.3 g/dL (ref 3.5–5.0)
Alkaline Phosphatase: 84 U/L (ref 38–126)
Bilirubin, Direct: 0.2 mg/dL (ref 0.0–0.2)
Indirect Bilirubin: 0.1 mg/dL — ABNORMAL LOW (ref 0.3–0.9)
Total Bilirubin: 0.3 mg/dL (ref 0.3–1.2)
Total Protein: 7.1 g/dL (ref 6.5–8.1)

## 2021-02-09 LAB — RETIC PANEL
Immature Retic Fract: 40.1 % — ABNORMAL HIGH (ref 2.3–15.9)
RBC.: 3.46 MIL/uL — ABNORMAL LOW (ref 3.87–5.11)
Retic Count, Absolute: 167.5 10*3/uL (ref 19.0–186.0)
Retic Ct Pct: 4.8 % — ABNORMAL HIGH (ref 0.4–3.1)
Reticulocyte Hemoglobin: 30.8 pg (ref >27.9)

## 2021-02-09 LAB — FERRITIN: Ferritin: 317 ng/mL — ABNORMAL HIGH (ref 11–307)

## 2021-02-09 NOTE — Progress Notes (Signed)
Hematology/Oncology Consult note [phone] [phone]      Patient Care Team: [name], FNP as PCP - General (Family Medicine) Kennith Center, RD as Dietitian (Family Medicine) Earlie Server, MD as Consulting Physician (Hematology and Oncology)  REFERRING PROVIDER: [name], FNP  CHIEF COMPLAINTS/REASON FOR VISIT:  Evaluation of iron deficiency anemia  HISTORY OF PRESENTING ILLNESS:   Jennifer [name] is a  71 y.o.  female with [place] listed below was seen in consultation at the request of  [name], FNP  for evaluation of iron deficiency anemia  Patient was admitted from 02/05/2021 - 02/06/2021 due to symptomatic anemia with bright red blood per rectum.  Initial hemoglobin at presentation was 5.9.  Patient was transfused with 2 units of PRBC.  Also received 1 dose of IV Venofer treatments.  Patient had a flexible sigmoidoscopy by Dr. Virgina Jock.  Findings of nonbleeding hemorrhoids.  Patient follows up with Dr. [name] outpatient.  Patient was previously taking aspirin which has been discontinued.  Patient had hospitalization from 11/17/2020 - 11/19/2020 due to symptomatic anemia secondary to intermittent BPBPR/melena.  She received blood transfusion during that hospitalization.  She also had a reaction to ferrous gluconate. She had EGD and colonoscopy on 11/19/2020 with no notable findings of active bleeding.   Patient has a history of bariatric surgery. Since discharge, patient reports feeling well.  She continues to feel quite fatigued.  She continues to have intermittent bright red blood per rectum.  Denies any lightheadedness, shortness of breath today.  Review of Systems  Constitutional:  Positive for fatigue. Negative for appetite change, chills and fever.  HENT:   Negative for hearing loss and voice change.   Eyes:  Negative for eye problems.  Respiratory:  Negative for chest tightness and cough.   Cardiovascular:  Negative for chest pain.   Gastrointestinal:  Positive for blood in stool. Negative for abdominal distention and abdominal pain.  Endocrine: Negative for hot flashes.  Genitourinary:  Negative for difficulty urinating and frequency.   Musculoskeletal:  Negative for arthralgias.  Skin:  Negative for itching and rash.  Neurological:  Negative for extremity weakness.  Hematological:  Negative for adenopathy.  Psychiatric/Behavioral:  Negative for confusion.    MEDICAL HISTORY:  Past Medical History:  Diagnosis Date   Anemia    Anxiety    Bariatric surgery status    Constipation    COVID-19    Dysrhythmia    Elevated liver enzymes    GERD (gastroesophageal reflux disease)    Hemorrhoids    Herpes genitalis    High cholesterol    Hyperlipidemia    Hypertension    Hypothyroidism    IDA (iron deficiency anemia) 02/09/2021   Neuropathy    Osteoarthritis    Sleep apnea     SURGICAL HISTORY: Past Surgical History:  Procedure Laterality Date   ABDOMINAL HYSTERECTOMY     total for fibroids no h/o abnormal pap   bariatric sleeve  2015   BREAST EXCISIONAL BIOPSY Left 1998   carpal tunnel repair     COLONOSCOPY WITH PROPOFOL N/A 02/11/2016   Procedure: COLONOSCOPY WITH PROPOFOL;  Surgeon: Jonathon Bellows, MD;  Location: [facility] ENDOSCOPY;  Service: Endoscopy;  Laterality: N/A;   COLONOSCOPY WITH PROPOFOL N/A 10/01/2019   Procedure: COLONOSCOPY WITH PROPOFOL;  Surgeon: Jonathon Bellows, MD;  Location: [facility] ENDOSCOPY;  Service: Gastroenterology;  Laterality: N/A;   COLONOSCOPY WITH PROPOFOL N/A 11/19/2020   Procedure: COLONOSCOPY WITH PROPOFOL;  Surgeon: Jonathon Bellows, MD;  Location: [facility] ENDOSCOPY;  Service: Gastroenterology;  Laterality: N/A;   ESOPHAGOGASTRODUODENOSCOPY (EGD) WITH PROPOFOL N/A 12/02/2019   Procedure: ESOPHAGOGASTRODUODENOSCOPY (EGD) WITH PROPOFOL;  Surgeon: Jonathon Bellows, MD;  Location: [facility] ENDOSCOPY;  Service: Gastroenterology;  Laterality: N/A;   ESOPHAGOGASTRODUODENOSCOPY (EGD) WITH PROPOFOL N/A  11/19/2020   Procedure: ESOPHAGOGASTRODUODENOSCOPY (EGD) WITH PROPOFOL;  Surgeon: Jonathon Bellows, MD;  Location: [facility] ENDOSCOPY;  Service: Gastroenterology;  Laterality: N/A;   FLEXIBLE SIGMOIDOSCOPY N/A 02/06/2021   Procedure: FLEXIBLE SIGMOIDOSCOPY;  Surgeon: [name], DO;  Location: [facility] ENDOSCOPY;  Service: Gastroenterology;  Laterality: N/A;   HEMORRHOID SURGERY     LAPAROSCOPIC GASTRIC RESTRICTIVE DUODENAL PROCEDURE (DUODENAL SWITCH) Bilateral    2020    SOCIAL HISTORY: Social History   Socioeconomic History   Marital status: Married    Spouse name: Not on file   Number of children: Not on file   Years of education: Not on file   Highest education level: Not on file  Occupational History   Not on file  Tobacco Use   Smoking status: Former    Packs/day: 1.50    Years: 24.00    Pack years: 36.00    Types: Cigarettes    Quit date: 70    Years since quitting: 30.0   Smokeless tobacco: Never   Tobacco comments:    quit 1995.   Vaping Use   Vaping Use: Never used  Substance and Sexual Activity   Alcohol use: Not Currently   Drug use: No   Sexual activity: Not Currently    Birth control/protection: Surgical    Comment: Hysterectomy  Other Topics Concern   Not on file  Social History Narrative   Lives in [place].    Married.    Retired 2015, Interior and spatial designer.    One son; granddaughter.    Left handed    Caffeine- decaf coffee.    Social Determinants of Health   Financial Resource Strain: Low Risk    Difficulty of Paying Living Expenses: Not hard at all  Food Insecurity: No Food Insecurity   Worried About Charity fundraiser in the Last Year: Never true   [place] in the Last Year: Never true  Transportation Needs: No Transportation Needs   Lack of Transportation (Medical): No   Lack of Transportation (Non-Medical): No  Physical Activity: Not on file  Stress: No Stress Concern Present   Feeling of Stress : Not at all  Social  Connections: Unknown   Frequency of Communication with Friends and Family: More than three times a week   Frequency of Social Gatherings with Friends and Family: More than three times a week   Attends Religious Services: Not on Electrical engineer or Organizations: Not on file   Attends Archivist Meetings: Not on file   Marital Status: Not on file  Intimate Partner Violence: Not At Risk   Fear of Current or Ex-Partner: No   Emotionally Abused: No   Physically Abused: No   Sexually Abused: No    FAMILY HISTORY: Family History  Problem Relation Age of Onset   Breast cancer Sister 22       materal 1/2 sister   Hypertension Sister    Hypertension Mother    Heart disease Father    Hypertension Brother     ALLERGIES:  is allergic to celecoxib, hydrocodone, pollen extract, sodium ferric gluconate [ferrous gluconate], nasacort [triamcinolone], and vicodin [hydrocodone-acetaminophen].  MEDICATIONS:  Current Outpatient Medications  Medication  Sig Dispense Refill   acyclovir (ZOVIRAX) 400 MG tablet Take 400 mg by mouth daily.     Ascorbic Acid (VITAMIN C PO) Take by mouth.     calcium citrate (CALCITRATE - DOSED IN MG ELEMENTAL CALCIUM) 950 (200 Ca) MG tablet Take 200 mg of elemental calcium by mouth daily.     famotidine (PEPCID) 40 MG tablet Take 40 mg by mouth daily.     fluticasone (FLONASE) 50 MCG/ACT nasal spray Place 2 sprays into both nostrils daily. 16 g 0   gabapentin (NEURONTIN) 300 MG capsule Take 1 capsule (300 mg total) by mouth 2 (two) times daily. 120 capsule 2   levothyroxine (SYNTHROID) 50 MCG tablet TAKE 1 TABLET BY MOUTH EVERY DAY 90 tablet 1   losartan (COZAAR) 100 MG tablet TAKE 1 TABLET BY MOUTH EVERY DAY AT BEDTIME 90 tablet 0   Multiple Vitamin (MULTIVITAMIN WITH MINERALS) TABS tablet Take 1 tablet by mouth daily.     Multiple Vitamins-Minerals (ZINC PO) Take by mouth.     olopatadine (PATADAY) 0.1 % ophthalmic solution Place 1 drop into both  eyes 2 (two) times daily. 5 mL 12   sertraline (ZOLOFT) 100 MG tablet Take 1 tablet (100 mg total) by mouth at bedtime. 90 tablet 1   sodium chloride (OCEAN) 0.65 % SOLN nasal spray Place 2 sprays into both nostrils as needed for congestion. 30 mL 2   acetaminophen (TYLENOL) 325 MG tablet Take 2 tablets (650 mg total) by mouth every 6 (six) hours as needed for mild pain (or Fever >/= 101). (Patient not taking: Reported on 02/09/2021)     hydrocortisone (ANUSOL-HC) 25 MG suppository Place 1 suppository (25 mg total) rectally 2 (two) times daily. (Patient not taking: Reported on 02/09/2021) 12 suppository 1   montelukast (SINGULAIR) 10 MG tablet Take 1 tablet (10 mg total) by mouth at bedtime. (Patient not taking: Reported on 02/09/2021) 90 tablet 1   No current facility-administered medications for this visit.     PHYSICAL EXAMINATION: ECOG PERFORMANCE STATUS: 1 - Symptomatic but completely ambulatory Vitals:   02/09/21 0930  BP: 139/75  Pulse: 81  Resp: 18  Temp: (!) 97.2 F (36.2 C)  SpO2: 100%   Filed Weights   02/09/21 0930  Weight: 188 lb (85.3 kg)    Physical Exam Constitutional:      General: She is not in acute distress. HENT:     Head: Normocephalic and atraumatic.  Eyes:     General: No scleral icterus. Cardiovascular:     Rate and Rhythm: Normal rate and regular rhythm.     Heart sounds: Normal heart sounds.  Pulmonary:     Effort: Pulmonary effort is normal. No respiratory distress.     Breath sounds: No wheezing.  Abdominal:     General: Bowel sounds are normal. There is no distension.     Palpations: Abdomen is soft.  Musculoskeletal:        General: No deformity. Normal range of motion.     Cervical back: Normal range of motion and neck supple.  Skin:    General: Skin is warm and dry.     Findings: No erythema or rash.  Neurological:     Mental Status: She is alert and oriented to person, place, and time. Mental status is at baseline.     Cranial Nerves:  No cranial nerve deficit.     Coordination: Coordination normal.  Psychiatric:        Mood and Affect: Mood normal.  LABORATORY DATA:  I have reviewed the data as listed Lab Results  Component Value Date   WBC 5.9 02/09/2021   HGB 9.1 (L) 02/09/2021   HCT 30.0 (L) 02/09/2021   MCV 86.2 02/09/2021   PLT 293 02/09/2021   Recent Labs    11/20/20 0400 11/25/20 1524 02/05/21 0814 02/06/21 0416 02/09/21 1017  NA 138 139 129* 134*  --   K 3.5 4.4 4.1 3.4*  --   CL 107 104 96* 97*  --   CO2 24 26 24 28   --   GLUCOSE 103* 88 114* 105*  --   BUN 7* 13 7* 6*  --   CREATININE 0.69 0.81 0.59 0.64  --   CALCIUM 8.5* 9.1 8.9 9.0  --   GFRNONAA >60  --  >60 >60  --   PROT 5.7* 6.6 6.4*  --  7.1  ALBUMIN 3.4* 4.3 3.8  --  4.3  AST 21 27 115*  --  87*  ALT 12 14 51*  --  48*  ALKPHOS 57 80 73  --  84  BILITOT 0.8 0.3 0.4  --  0.3  BILIDIR  --   --  <0.1  --  0.2  IBILI  --   --  NOT CALCULATED  --  0.1*   Iron/TIBC/Ferritin/ %Sat    Component Value Date/Time   IRON 42 02/09/2021 1017   TIBC 468 (H) 02/09/2021 1017   FERRITIN 317 (H) 02/09/2021 1017   IRONPCTSAT 9 (L) 02/09/2021 1017      RADIOGRAPHIC STUDIES: I have personally reviewed the radiological images as listed and agreed with the findings in the report. DG Chest 2 View  Result Date: 02/05/2021 CLINICAL DATA:  Dyspnea on exertion.  Dizziness for 4 days. EXAM: CHEST - 2 VIEW COMPARISON:  Radiographs 11/16/2020 and 06/09/2020. FINDINGS: The heart size and mediastinal contours are stable. The lungs are clear. There is no pleural effusion or pneumothorax. Mild degenerative changes are present within the thoracic spine. IMPRESSION: Stable chest.  No active cardiopulmonary process. Electronically Signed   By: Richardean Sale M.D.   On: 02/05/2021 08:42      ASSESSMENT & PLAN:  1. Iron deficiency anemia due to chronic blood loss   2. Elevated liver function tests   3. Bright red blood per rectum    #Iron deficiency  anemia due to chronic blood loss. Repeat CBC, iron TIBC ferritin. -Labs reviewed.  CBC showed hemoglobin of 9.1, MCV 86, reticulocyte hemoglobin 30.8, Decreased iron saturation of 9, TIBC 468.  Ferritin is high at 317, likely due to recent iron infusion. Recommend patient to proceed with 1 additional dose of Venofer treatments to further improve iron stores. Patient has previously received IV Venofer without any infusion reactions.-Prior infusion reaction to ferrous gluconate.  Elevated liver function testing.  Repeating LFT showed improving counts.  Blood per rectum, follow-up with Dr. [name].  She may need small bowel capsule study.  Orders Placed This Encounter  Procedures   Ferritin    Standing Status:   Future    Number of Occurrences:   1    Standing Expiration Date:   08/09/2021   CBC with Differential/Platelet    Standing Status:   Future    Number of Occurrences:   1    Standing Expiration Date:   02/09/2022   Retic Panel    Standing Status:   Future    Number of Occurrences:   1    Standing Expiration Date:  02/09/2022   Iron and TIBC([facility])    Standing Status:   Future    Number of Occurrences:   1    Standing Expiration Date:   02/09/2022   Hepatic function panel    Standing Status:   Future    Number of Occurrences:   1    Standing Expiration Date:   02/09/2022    All questions were answered. The patient knows to call the clinic with any problems questions or concerns.  c [name], FNP    Return of visit: Follow-up in 3 months for evaluation of need of additional IV iron. Thank you for this kind referral and the opportunity to participate in the care of this patient. A copy of today's note is routed to referring provider   Earlie Server, MD, PhD [facility] Hematology Oncology 02/09/2021

## 2021-02-09 NOTE — Progress Notes (Signed)
Patient states that she just got out of the hospital. She says she just went through the same thing in November and she wants to know why her hemoglobin keeps dropping. She also wants someone to look at her left thumb. Patient is not sleeping well, decrease in appetite, some head congestion, neuropathy in hands in feet, she lives with her husband.

## 2021-02-09 NOTE — Addendum Note (Signed)
Addended by: Earlie Server on: 02/09/2021 08:01 PM   Modules accepted: Orders

## 2021-02-09 NOTE — Telephone Encounter (Signed)
Patient called back and I was able to schedule her an appointment for her first hemorrhoid banding on 02/11/2021 at 2:15 PM. Patient agreed and had no further questions.

## 2021-02-10 ENCOUNTER — Telehealth: Payer: Self-pay

## 2021-02-10 ENCOUNTER — Ambulatory Visit (INDEPENDENT_AMBULATORY_CARE_PROVIDER_SITE_OTHER): Payer: Medicare Other | Admitting: Podiatry

## 2021-02-10 ENCOUNTER — Encounter: Payer: Self-pay | Admitting: Podiatry

## 2021-02-10 ENCOUNTER — Telehealth: Payer: Self-pay | Admitting: Family

## 2021-02-10 ENCOUNTER — Ambulatory Visit: Payer: Medicare Other

## 2021-02-10 DIAGNOSIS — E1142 Type 2 diabetes mellitus with diabetic polyneuropathy: Secondary | ICD-10-CM

## 2021-02-10 DIAGNOSIS — S90822A Blister (nonthermal), left foot, initial encounter: Secondary | ICD-10-CM | POA: Diagnosis not present

## 2021-02-10 DIAGNOSIS — B353 Tinea pedis: Secondary | ICD-10-CM

## 2021-02-10 DIAGNOSIS — M7750 Other enthesopathy of unspecified foot: Secondary | ICD-10-CM

## 2021-02-10 MED ORDER — KETOCONAZOLE 2 % EX CREA: 1.0000 "application " | TOPICAL_CREAM | Freq: Every day | CUTANEOUS | 2 refills | Status: DC

## 2021-02-10 NOTE — Telephone Encounter (Signed)
Jennifer [name] and Dr Simonne Maffucci  Please cal pt I understand [name] has ordered imaging study- either CT a/p or MRI entero.  I reviewed her chart and have no history of kidney disease or kidney injury. Has she had a reaction to contrast in the past which I am unaware of?  I also dont see where she has seen nephrology in the past.   Crt 0.64 and GFR > 60 which was normal on 02/06/21  She had Ct abdomen and pelvis with contrast 05/24/19. Did she have concerns after that CT was obtained?  Please get more info and if she remains worried, then I could arrange nephrology consult ahead of abdominal imaging.

## 2021-02-10 NOTE — Telephone Encounter (Signed)
[place], please schedule patient for IV venofer x1.  Lab 3 months MD/+/- venofer 1-2 days after. Please notify patient of appts. Thanks

## 2021-02-10 NOTE — Telephone Encounter (Signed)
-----   Message from Earlie Server, MD sent at 02/09/2021  8:00 PM EST ----- Please schedule patient to proceed with IV Venofer x1. Follow-up in 3 months, iron labs-ordered, prior to MD visit +/-Venofer

## 2021-02-10 NOTE — Telephone Encounter (Signed)
Left VM with appointment details. Encouraged pt to call back or reach out via MyChart if we need to reschedule.

## 2021-02-10 NOTE — Telephone Encounter (Signed)
LMTCB

## 2021-02-10 NOTE — Patient Instructions (Signed)
Look for urea 40% cream or ointment and apply to the thickened dry skin / calluses. This can be bought over the counter, at a pharmacy or online such as [employer].  

## 2021-02-10 NOTE — Progress Notes (Signed)
°  Subjective:  Patient ID: Jennifer [name], female    DOB: April 10, 1950,  MRN: [id number]  Chief Complaint  Patient presents with   Diabetes    Diabetic foot exam   Diabetic Ulcer    Had a blister/ulcer on her left heel that was infected - much better now after finishing antibiotics    71 y.o. female presents with the above complaint. History confirmed with patient.  She is on antibiotics from her PCP.  Infection pretty much resolved at this point.  She had used a pedi egg type shaver on the heel which caused  Objective:  Physical Exam: warm, good capillary refill, no trophic changes or ulcerative lesions, normal DP and PT pulses, and abnormal sensation with loss of protective sensation to tips of toes, hyperkeratosis plantar heel bilateral with dry scaling skin, no signs of active infection drainage wound no purulence of the left heel. Assessment:   1. Blister of left heel, initial encounter   2. Diabetic polyneuropathy associated with type 2 diabetes mellitus ([place])   3. Tinea pedis of both feet      Plan:  Patient was evaluated and treated and all questions answered.  Patient educated on diabetes. Discussed proper diabetic foot care and discussed risks and complications of disease. Educated patient in depth on reasons to return to the office immediately should he/she discover anything concerning or new on the feet. All questions answered. Discussed proper shoes as well.   Discussed the etiology and treatment options for tinea pedis which I think is the primary culprit of most of the dry scaling skin she has.  Discussed topical and oral treatment.  Recommended topical treatment with 2% ketoconazole cream.  This was sent to the patient's pharmacy.    I also recommended use of urea cream and a gentle debridement such as a pumice stone on the heels bilateral.  I will see her back in 1 year for annual diabetic foot exam.   Return in about 1 year (around 02/10/2022) for diabetic foot  exam.

## 2021-02-10 NOTE — Telephone Encounter (Signed)
-----   Message from Ria Bush, MD sent at 02/09/2021 11:19 AM EST ----- Regarding: RE: MRI enterography Will forward to PCP [name] at [place]   ----- Message ----- From: [name], [place]: 02/09/2021  10:33 AM EST To: Murlean Iba, MD, Ria Bush, MD Subject: MRI enterography                               Hello doctors. I am Herb Grays and work with Dr. Jonathon Bellows ([facility]). I need your advise. This mutual patient is needing a CT entero Abd pelvis w/ contrast but declined due to her kidney disease. I mentioned it to Dr. [name], therefore, he ordered a MRI entero and the patient declined again stating that this will hurt her kidneys. I then called the MRI department and asked if the patient was able to do this MRI and they asked the radiologist if it could be done in her case and the response was yes. I then called the patient and she declined again stating that it will hurt her kidneys and that any type of contrast is not good for her. Can you please let me know if the MRI could be done or not so I could tell Dr. [name]. Thank you for your time. Patient also has a colonoscopy scheduled to be done on 02/19/2021.  [name]

## 2021-02-10 NOTE — Progress Notes (Signed)
Phone note created 

## 2021-02-11 ENCOUNTER — Encounter: Payer: Self-pay | Admitting: Oncology

## 2021-02-11 ENCOUNTER — Ambulatory Visit (INDEPENDENT_AMBULATORY_CARE_PROVIDER_SITE_OTHER): Payer: Medicare Other | Admitting: Gastroenterology

## 2021-02-11 ENCOUNTER — Encounter: Payer: Self-pay | Admitting: Gastroenterology

## 2021-02-11 ENCOUNTER — Other Ambulatory Visit: Payer: Self-pay

## 2021-02-11 VITALS — BP 118/68 | HR 77 | Temp 98.7°F | Wt 181.0 lb

## 2021-02-11 DIAGNOSIS — A048 Other specified bacterial intestinal infections: Secondary | ICD-10-CM

## 2021-02-11 DIAGNOSIS — Z8719 Personal history of other diseases of the digestive system: Secondary | ICD-10-CM

## 2021-02-11 DIAGNOSIS — K648 Other hemorrhoids: Secondary | ICD-10-CM | POA: Diagnosis not present

## 2021-02-11 NOTE — Telephone Encounter (Signed)
Good morning ladies. I wanted to apologize but I had two charts opened and this is the wrong patient in reference to having kidney issue or needing the MR entero. However, I realized it and the mistake had been fixed and the right people had been informed. Thank you and I'm sorry for the confusion.

## 2021-02-11 NOTE — Progress Notes (Signed)
Jonathon Bellows MD, MRCP(U.K) [address]  [place]  [place], [place] [address]  Main: [phone]  Fax: [phone]   Primary Care Physician: [name], FNP  Primary Gastroenterologist:  Dr. Jonathon Bellows   Chief Complaint  Patient presents with   Banding # 1    HPI: Naveen Lorusso is a 71 y.o. female  Summary of history :     Last seen in 2021 for constipation   10/01/2019: Colonoscopy: Prior history of adenomatous polyps.  A 4 mm polyp noted in the ascending colon.  Resected.  On retroflexion internal hemorrhoids seen on pictures.  The polyp was a tubular adenoma. Admitted 11/17/2020 for Melena  , history of gastric sleeve surgery and then a redo duodenal switch 11/19/2020: EGD for dysphagia :Post surgical changes but no strictures. Colonoscopy was normal but prep was poor .     Interval history  12/21/2020-02/11/2021   02/06/2021 : She was admitted with rectal bleeding anemic with hemoglobin of 5.9.  Underwent sigmoidoscopy with no abnormality was seen except for internal hemorrhoids and she is here today to discuss about banding  .  She states that she is sore in the perianal area    PROCEDURE NOTE: The patient presents with symptomatic grade 2 hemorrhoids, unresponsive to maximal medical therapy, requesting rubber band ligation of his/her hemorrhoidal disease.  All risks, benefits and alternative forms of therapy were described and informed consent was obtained.  In the Left Lateral Decubitus position (if anoscopy is performed) anoscopic examination revealed grade 2 hemorrhoids in the all position(s).   The decision was made to band the LL internal hemorrhoid, and the [place] was used to perform band ligation without complication.  Digital anorectal examination was then performed to assure proper positioning of the band, and to adjust the banded tissue as required.  The patient was discharged home without pain or other issues.  Dietary and  behavioral recommendations were given and (if necessary - prescriptions were given), along with follow-up instructions.  The patient will return 4 weeks for follow-up and possible additional banding as required.  No complications were encountered and the patient tolerated the procedure well.    Current Outpatient Medications  Medication Sig Dispense Refill   acetaminophen (TYLENOL) 325 MG tablet Take 2 tablets (650 mg total) by mouth every 6 (six) hours as needed for mild pain (or Fever >/= 101).     acyclovir (ZOVIRAX) 400 MG tablet Take 400 mg by mouth daily.     Ascorbic Acid (VITAMIN C PO) Take by mouth.     calcium citrate (CALCITRATE - DOSED IN MG ELEMENTAL CALCIUM) 950 (200 Ca) MG tablet Take 200 mg of elemental calcium by mouth daily.     famotidine (PEPCID) 40 MG tablet Take 40 mg by mouth daily.     fluticasone (FLONASE) 50 MCG/ACT nasal spray Place 2 sprays into both nostrils daily. 16 g 0   gabapentin (NEURONTIN) 300 MG capsule Take 1 capsule (300 mg total) by mouth 2 (two) times daily. 120 capsule 2   hydrocortisone (ANUSOL-HC) 25 MG suppository Place 1 suppository (25 mg total) rectally 2 (two) times daily. 12 suppository 1   ketoconazole (NIZORAL) 2 % cream Apply 1 application topically daily. Apply on bottom of foot and thin layer between toes 60 g 2   levothyroxine (SYNTHROID) 50 MCG tablet TAKE 1 TABLET BY MOUTH EVERY DAY 90 tablet 1   losartan (COZAAR) 100 MG tablet TAKE 1 TABLET BY MOUTH EVERY DAY  AT BEDTIME 90 tablet 0   montelukast (SINGULAIR) 10 MG tablet Take 1 tablet (10 mg total) by mouth at bedtime. 90 tablet 1   Multiple Vitamin (MULTIVITAMIN WITH MINERALS) TABS tablet Take 1 tablet by mouth daily.     Multiple Vitamins-Minerals (ZINC PO) Take by mouth.     olopatadine (PATADAY) 0.1 % ophthalmic solution Place 1 drop into both eyes 2 (two) times daily. 5 mL 12   sertraline (ZOLOFT) 100 MG tablet Take 1 tablet (100 mg total) by mouth at bedtime. 90 tablet 1   sodium  chloride (OCEAN) 0.65 % SOLN nasal spray Place 2 sprays into both nostrils as needed for congestion. 30 mL 2   No current facility-administered medications for this visit.    Allergies as of 02/11/2021 - Review Complete 02/11/2021  Allergen Reaction Noted   Celecoxib Hives 07/25/2016   Hydrocodone Nausea Only 06/02/2020   Pollen extract     Sodium ferric gluconate [ferrous gluconate]  11/20/2020   Nasacort [triamcinolone] Other (See Comments) 12/07/2015   Vicodin [hydrocodone-acetaminophen] Nausea Only 09/03/2014    ROS:  General: Negative for anorexia, weight loss, fever, chills, fatigue, weakness. ENT: Negative for hoarseness, difficulty swallowing , nasal congestion. CV: Negative for chest pain, angina, palpitations, dyspnea on exertion, peripheral edema.  Respiratory: Negative for dyspnea at rest, dyspnea on exertion, cough, sputum, wheezing.  GI: See history of present illness. GU:  Negative for dysuria, hematuria, urinary incontinence, urinary frequency, nocturnal urination.  Endo: Negative for unusual weight change.    Physical Examination:   BP 118/68    Pulse 77    Temp 98.7 F (37.1 C) (Oral)    Wt 181 lb (82.1 kg)    BMI 35.35 kg/m   General: Well-nourished, well-developed in no acute distress.  Neuro: Alert and oriented x 3.  Grossly intact. Skin: Warm and dry, no jaundice.   Psych: Alert and cooperative, normal mood and affect.   Imaging Studies: DG Chest 2 View  Result Date: 02/05/2021 CLINICAL DATA:  Dyspnea on exertion.  Dizziness for 4 days. EXAM: CHEST - 2 VIEW COMPARISON:  Radiographs 11/16/2020 and 06/09/2020. FINDINGS: The heart size and mediastinal contours are stable. The lungs are clear. There is no pleural effusion or pneumothorax. Mild degenerative changes are present within the thoracic spine. IMPRESSION: Stable chest.  No active cardiopulmonary process. Electronically Signed   By: Richardean Sale M.D.   On: 02/05/2021 08:42    Assessment and Plan:    Louis Gaw is a 71 y.o. y/o female here to follow up after recent hospitalization in 11/2020 likely for hemorrhoidal bleed and was found to have H pylori positive on serology.     Plan H pylori breath test confirm eradication of H. Pylori 2.  Hemorrhoids conservative management at home s/p banding  Dr Jonathon Bellows  MD,MRCP [facility]) Follow up in 4 weeks

## 2021-02-11 NOTE — Telephone Encounter (Signed)
Noted. Thank you. If patient calls back I will let her know that she was called on mistake.

## 2021-02-12 NOTE — Telephone Encounter (Signed)
noted 

## 2021-02-13 LAB — H. PYLORI BREATH TEST: H pylori Breath Test: NEGATIVE

## 2021-02-15 ENCOUNTER — Telehealth: Payer: Self-pay | Admitting: Family

## 2021-02-15 NOTE — Telephone Encounter (Signed)
Scheduled appointment

## 2021-02-15 NOTE — Telephone Encounter (Signed)
Pt called in stating that she is having some type of allergy issue. Pt stated that both of her eyes are draining. Pt stated that this drainage has been going on for about a week or so. Pt stated that when her eyes drain it makes her face itchy. Pt requesting callback.

## 2021-02-16 ENCOUNTER — Other Ambulatory Visit: Payer: Self-pay

## 2021-02-16 ENCOUNTER — Ambulatory Visit: Payer: Medicare Other | Admitting: Gastroenterology

## 2021-02-16 ENCOUNTER — Ambulatory Visit (INDEPENDENT_AMBULATORY_CARE_PROVIDER_SITE_OTHER): Payer: Medicare Other | Admitting: Family

## 2021-02-16 VITALS — BP 118/62 | HR 72 | Temp 98.5°F | Ht 60.0 in | Wt 182.2 lb

## 2021-02-16 DIAGNOSIS — H5789 Other specified disorders of eye and adnexa: Secondary | ICD-10-CM

## 2021-02-16 MED ORDER — ERYTHROMYCIN 5 MG/GM OP OINT: TOPICAL_OINTMENT | OPHTHALMIC | 0 refills | Status: DC

## 2021-02-16 NOTE — Assessment & Plan Note (Addendum)
Acute on chronic.  Nasal congestion completely resolved.  No sinus pain today.  Advised reasonable to treat empirically for bacterial conjunctivitis.  Unfortunately she is been on Pataday making dry eyes worse.  Advised to hold any further antihistamines ophthalmic drops though may be helpful for itching, watering eyes. Advised to start singulair as she hasnt yet.   If no resolution with erythromycin ointment, advised to follow-up with ophthalmology, Dr Edison Pace, to discuss further evaluation, treatment.

## 2021-02-16 NOTE — Patient Instructions (Signed)
Trial erythromycin ointment for bilateral eye discharge.  Please let me know if no resolution

## 2021-02-16 NOTE — Progress Notes (Signed)
Subjective:    Patient ID: Jennifer [name], female    DOB: [birth year], 71 y.o.   MRN: [id number]  CC: Jennifer [name] is a 71 y.o. female who presents today for an acute visit.    HPI: Complains of bilateral eye discharge x week ago. Clear runny most of time with some thick discharge , matting of eyelashes.  Eyes are itching and dry.  No eye redness, gritty sensation,  foreign body sensation, vision changes, vision loss, severe HA, sinus pain, coughing, sob. Occasional sneeze.  She doesn't wear contacts.  It has resolved after I saw her last and recurred 1 week ago.  Nasal congestion has completely resolved.     Subsequent visit as we discussed this previously discussed 01/19/2021.She has tried flonase, mucinex, OTC eye drops for dry eyes, Pataday  She didn't start singulair.  Compliant with xyzal  Follows with Dr Edison Pace at [facility].   Currently being followed by Dr. [name] for H. pylori infection eradication confirmed 5 days ago, negative.   Status post banding of hemorrhoids.  Recent doxycyline and amoxicillin earlier this month.   HISTORY:  Past Medical History:  Diagnosis Date   Anemia    Anxiety    Bariatric surgery status    Constipation    COVID-19    Dysrhythmia    Elevated liver enzymes    GERD (gastroesophageal reflux disease)    Hemorrhoids    Herpes genitalis    High cholesterol    Hyperlipidemia    Hypertension    Hypothyroidism    IDA (iron deficiency anemia) 02/09/2021   Neuropathy    Osteoarthritis    Sleep apnea    Past Surgical History:  Procedure Laterality Date   ABDOMINAL HYSTERECTOMY     total for fibroids no h/o abnormal pap   bariatric sleeve  2015   BREAST EXCISIONAL BIOPSY Left 1998   carpal tunnel repair     COLONOSCOPY WITH PROPOFOL N/A 02/11/2016   Procedure: COLONOSCOPY WITH PROPOFOL;  Surgeon: Jonathon Bellows, MD;  Location: [facility] ENDOSCOPY;  Service: Endoscopy;  Laterality: N/A;   COLONOSCOPY WITH PROPOFOL N/A 10/01/2019    Procedure: COLONOSCOPY WITH PROPOFOL;  Surgeon: Jonathon Bellows, MD;  Location: [facility] ENDOSCOPY;  Service: Gastroenterology;  Laterality: N/A;   COLONOSCOPY WITH PROPOFOL N/A 11/19/2020   Procedure: COLONOSCOPY WITH PROPOFOL;  Surgeon: Jonathon Bellows, MD;  Location: [facility] ENDOSCOPY;  Service: Gastroenterology;  Laterality: N/A;   ESOPHAGOGASTRODUODENOSCOPY (EGD) WITH PROPOFOL N/A 12/02/2019   Procedure: ESOPHAGOGASTRODUODENOSCOPY (EGD) WITH PROPOFOL;  Surgeon: Jonathon Bellows, MD;  Location: [facility] ENDOSCOPY;  Service: Gastroenterology;  Laterality: N/A;   ESOPHAGOGASTRODUODENOSCOPY (EGD) WITH PROPOFOL N/A 11/19/2020   Procedure: ESOPHAGOGASTRODUODENOSCOPY (EGD) WITH PROPOFOL;  Surgeon: Jonathon Bellows, MD;  Location: [facility] ENDOSCOPY;  Service: Gastroenterology;  Laterality: N/A;   FLEXIBLE SIGMOIDOSCOPY N/A 02/06/2021   Procedure: FLEXIBLE SIGMOIDOSCOPY;  Surgeon: [name], DO;  Location: [facility] ENDOSCOPY;  Service: Gastroenterology;  Laterality: N/A;   HEMORRHOID SURGERY     LAPAROSCOPIC GASTRIC RESTRICTIVE DUODENAL PROCEDURE (DUODENAL SWITCH) Bilateral    2020   Family History  Problem Relation Age of Onset   Breast cancer Sister 22       materal 1/2 sister   Hypertension Sister    Hypertension Mother    Heart disease Father    Hypertension Brother     Allergies: Celecoxib, Hydrocodone, Pollen extract, Sodium ferric gluconate [ferrous gluconate], Nasacort [triamcinolone], and Vicodin [hydrocodone-acetaminophen] Current Outpatient Medications on File Prior to Visit  Medication Sig Dispense Refill  acetaminophen (TYLENOL) 325 MG tablet Take 2 tablets (650 mg total) by mouth every 6 (six) hours as needed for mild pain (or Fever >/= 101).     acyclovir (ZOVIRAX) 400 MG tablet Take 400 mg by mouth daily.     Ascorbic Acid (VITAMIN C PO) Take by mouth.     calcium citrate (CALCITRATE - DOSED IN MG ELEMENTAL CALCIUM) 950 (200 Ca) MG tablet Take 200 mg of elemental calcium by mouth daily.      famotidine (PEPCID) 40 MG tablet Take 40 mg by mouth daily.     fluticasone (FLONASE) 50 MCG/ACT nasal spray Place 2 sprays into both nostrils daily. 16 g 0   gabapentin (NEURONTIN) 300 MG capsule Take 1 capsule (300 mg total) by mouth 2 (two) times daily. 120 capsule 2   hydrocortisone (ANUSOL-HC) 25 MG suppository Place 1 suppository (25 mg total) rectally 2 (two) times daily. 12 suppository 1   ketoconazole (NIZORAL) 2 % cream Apply 1 application topically daily. Apply on bottom of foot and thin layer between toes 60 g 2   levothyroxine (SYNTHROID) 50 MCG tablet TAKE 1 TABLET BY MOUTH EVERY DAY 90 tablet 1   losartan (COZAAR) 100 MG tablet TAKE 1 TABLET BY MOUTH EVERY DAY AT BEDTIME 90 tablet 0   montelukast (SINGULAIR) 10 MG tablet Take 1 tablet (10 mg total) by mouth at bedtime. 90 tablet 1   Multiple Vitamin (MULTIVITAMIN WITH MINERALS) TABS tablet Take 1 tablet by mouth daily.     Multiple Vitamins-Minerals (ZINC PO) Take by mouth.     olopatadine (PATADAY) 0.1 % ophthalmic solution Place 1 drop into both eyes 2 (two) times daily. 5 mL 12   sertraline (ZOLOFT) 100 MG tablet Take 1 tablet (100 mg total) by mouth at bedtime. 90 tablet 1   sodium chloride (OCEAN) 0.65 % SOLN nasal spray Place 2 sprays into both nostrils as needed for congestion. 30 mL 2   No current facility-administered medications on file prior to visit.    Social History   Tobacco Use   Smoking status: Former    Packs/day: 1.50    Years: 24.00    Pack years: 36.00    Types: Cigarettes    Quit date: 1993    Years since quitting: 30.1   Smokeless tobacco: Never   Tobacco comments:    quit 1995.   Vaping Use   Vaping Use: Never used  Substance Use Topics   Alcohol use: Not Currently   Drug use: No    Review of Systems  Constitutional:  Negative for chills and fever.  HENT:  Negative for congestion.   Eyes:  Positive for discharge and itching. Negative for photophobia, pain, redness and visual disturbance.   Respiratory:  Negative for cough.   Cardiovascular:  Negative for chest pain and palpitations.  Gastrointestinal:  Negative for nausea and vomiting.  Neurological:  Negative for headaches.     Objective:    BP 118/62    Pulse 72    Temp 98.5 F (36.9 C) (Oral)    Ht 5' (1.524 m)    Wt 182 lb 3.2 oz (82.6 kg)    SpO2 96%    BMI 35.58 kg/m    Physical Exam Vitals reviewed.  Constitutional:      Appearance: She is well-developed.  HENT:     Head: Normocephalic and atraumatic.     Right Ear: Hearing, tympanic membrane, ear canal and external ear normal. No decreased hearing noted. No drainage, swelling  or tenderness. No middle ear effusion. No foreign body. Tympanic membrane is not erythematous or bulging.     Left Ear: Hearing, tympanic membrane, ear canal and external ear normal. No decreased hearing noted. No drainage, swelling or tenderness.  No middle ear effusion. No foreign body. Tympanic membrane is not erythematous or bulging.     Nose: Nose normal. No rhinorrhea.     Right Sinus: No maxillary sinus tenderness or frontal sinus tenderness.     Left Sinus: No maxillary sinus tenderness or frontal sinus tenderness.     Mouth/Throat:     Pharynx: Uvula midline. No oropharyngeal exudate or posterior oropharyngeal erythema.     Tonsils: No tonsillar abscesses.  Eyes:     General: Lids are everted, no foreign bodies appreciated. No scleral icterus.       Right eye: No discharge.        Left eye: No discharge or hordeolum.     Conjunctiva/sclera: Conjunctivae normal.     Right eye: Right conjunctiva is not injected. No hemorrhage.    Left eye: Left conjunctiva is not injected. No hemorrhage.    Pupils: Pupils are equal, round, and reactive to light.     Comments: No external eye lesions. Surrounding skin intact.   Bilateral eyes:   No injection of the conjunctiva. Scant clear discharge. No white spots, opacity, or foreign body appreciated. No collection of blood or pus in the  anterior chamber. No ciliary flush surrounding iris.   No photophobia or eye pain appreciated during exam.   Cardiovascular:     Rate and Rhythm: Regular rhythm.     Pulses: Normal pulses.     Heart sounds: Normal heart sounds.  Pulmonary:     Effort: Pulmonary effort is normal.     Breath sounds: Normal breath sounds. No wheezing, rhonchi or rales.  Lymphadenopathy:     Head:     Right side of head: No submental, submandibular, tonsillar, preauricular, posterior auricular or occipital adenopathy.     Left side of head: No submental, submandibular, tonsillar, preauricular, posterior auricular or occipital adenopathy.     Cervical: No cervical adenopathy.  Skin:    General: Skin is warm and dry.  Neurological:     Mental Status: She is alert.  Psychiatric:        Speech: Speech normal.        Behavior: Behavior normal.        Thought Content: Thought content normal.       Assessment & Plan:   Problem List Items Addressed This Visit       Other   Eye discharge - Primary    Acute on chronic.  Nasal congestion completely resolved.  No sinus pain today.  Advised reasonable to treat empirically for bacterial conjunctivitis.  Unfortunately she is been on Pataday making dry eyes worse.  Advised to hold any further antihistamines ophthalmic drops though may be helpful for itching, watering eyes. Advised to start singulair as she hasnt yet.   If no resolution with erythromycin ointment, advised to follow-up with ophthalmology, Dr Edison Pace, to discuss further evaluation, treatment.          I am having Jennifer [name] maintain her acetaminophen, acyclovir, levothyroxine, sertraline, gabapentin, hydrocortisone, multivitamin with minerals, calcium citrate, losartan, montelukast, Ascorbic Acid (VITAMIN C PO), Multiple Vitamins-Minerals (ZINC PO), olopatadine, sodium chloride, fluticasone, famotidine, and ketoconazole.   Meds ordered this encounter  Medications   DISCONTD: erythromycin  ophthalmic ointment    Sig: Use one  half inch four times daily to affected eye (s) x 7 days.    Dispense:  3.5 g    Refill:  0    Order Specific Question:   Supervising Provider    Answer:   Crecencio Mc [[id number]]    Return precautions given.   Risks, benefits, and alternatives of the medications and treatment plan prescribed today were discussed, and patient expressed understanding.   Education regarding symptom management and diagnosis given to patient on AVS.  Continue to follow with [name], FNP for routine health maintenance.   Jennifer [name] and I agreed with plan.   [name], FNP

## 2021-02-17 ENCOUNTER — Inpatient Hospital Stay: Payer: Medicare Other | Attending: Oncology

## 2021-02-17 VITALS — BP 131/78 | HR 70 | Temp 97.9°F | Resp 18

## 2021-02-17 DIAGNOSIS — D509 Iron deficiency anemia, unspecified: Secondary | ICD-10-CM | POA: Diagnosis not present

## 2021-02-17 DIAGNOSIS — D62 Acute posthemorrhagic anemia: Secondary | ICD-10-CM

## 2021-02-17 DIAGNOSIS — D500 Iron deficiency anemia secondary to blood loss (chronic): Secondary | ICD-10-CM

## 2021-02-17 MED ORDER — SODIUM CHLORIDE 0.9 % IV SOLN: 200.0000 mg | Freq: Once | INTRAVENOUS | Status: DC

## 2021-02-17 MED ORDER — IRON SUCROSE 20 MG/ML IV SOLN
200.0000 mg | Freq: Once | INTRAVENOUS | Status: AC
  Administered 2021-02-17: 200 mg via INTRAVENOUS
  Filled 2021-02-17: qty 10

## 2021-02-17 MED ORDER — SODIUM CHLORIDE 0.9 % IV SOLN
Freq: Once | INTRAVENOUS | Status: AC
  Filled 2021-02-17: qty 250

## 2021-02-17 NOTE — Patient Instructions (Signed)

## 2021-02-19 DIAGNOSIS — H04123 Dry eye syndrome of bilateral lacrimal glands: Secondary | ICD-10-CM | POA: Diagnosis not present

## 2021-02-25 ENCOUNTER — Encounter: Payer: Self-pay | Admitting: Oncology

## 2021-03-01 ENCOUNTER — Other Ambulatory Visit: Payer: Self-pay | Admitting: Family

## 2021-03-04 ENCOUNTER — Encounter: Payer: Self-pay | Admitting: Oncology

## 2021-03-04 DIAGNOSIS — R002 Palpitations: Secondary | ICD-10-CM | POA: Diagnosis not present

## 2021-03-04 DIAGNOSIS — R0602 Shortness of breath: Secondary | ICD-10-CM | POA: Diagnosis not present

## 2021-03-04 DIAGNOSIS — R011 Cardiac murmur, unspecified: Secondary | ICD-10-CM | POA: Diagnosis not present

## 2021-03-04 DIAGNOSIS — I872 Venous insufficiency (chronic) (peripheral): Secondary | ICD-10-CM | POA: Diagnosis not present

## 2021-03-04 DIAGNOSIS — R001 Bradycardia, unspecified: Secondary | ICD-10-CM | POA: Diagnosis not present

## 2021-03-04 DIAGNOSIS — R0609 Other forms of dyspnea: Secondary | ICD-10-CM | POA: Diagnosis not present

## 2021-03-04 DIAGNOSIS — I208 Other forms of angina pectoris: Secondary | ICD-10-CM | POA: Diagnosis not present

## 2021-03-04 DIAGNOSIS — G4733 Obstructive sleep apnea (adult) (pediatric): Secondary | ICD-10-CM | POA: Diagnosis not present

## 2021-03-04 DIAGNOSIS — E785 Hyperlipidemia, unspecified: Secondary | ICD-10-CM | POA: Diagnosis not present

## 2021-03-04 DIAGNOSIS — R000 Tachycardia, unspecified: Secondary | ICD-10-CM | POA: Diagnosis not present

## 2021-03-04 DIAGNOSIS — R9431 Abnormal electrocardiogram [ECG] [EKG]: Secondary | ICD-10-CM | POA: Diagnosis not present

## 2021-03-11 ENCOUNTER — Other Ambulatory Visit: Payer: Self-pay | Admitting: Family

## 2021-03-11 DIAGNOSIS — K219 Gastro-esophageal reflux disease without esophagitis: Secondary | ICD-10-CM

## 2021-03-11 DIAGNOSIS — Z0000 Encounter for general adult medical examination without abnormal findings: Secondary | ICD-10-CM

## 2021-03-11 DIAGNOSIS — E039 Hypothyroidism, unspecified: Secondary | ICD-10-CM

## 2021-03-15 ENCOUNTER — Other Ambulatory Visit: Payer: Self-pay

## 2021-03-15 ENCOUNTER — Encounter: Payer: Self-pay | Admitting: Gastroenterology

## 2021-03-15 ENCOUNTER — Ambulatory Visit (INDEPENDENT_AMBULATORY_CARE_PROVIDER_SITE_OTHER): Payer: Medicare Other | Admitting: Gastroenterology

## 2021-03-15 VITALS — BP 169/83 | HR 82 | Temp 99.0°F | Wt 179.0 lb

## 2021-03-15 DIAGNOSIS — K648 Other hemorrhoids: Secondary | ICD-10-CM

## 2021-03-15 NOTE — Progress Notes (Signed)
Patient follow-ups today for banding of hemorrhoids    Summary of history :  02/06/2021 : She was admitted with rectal bleeding anemic with hemoglobin of 5.9.  Underwent sigmoidoscopy with no abnormality was seen except for internal hemorrhoids .   10/01/2019: Colonoscopy: Prior history of adenomatous polyps.  A 4 mm polyp noted in the ascending colon.  Resected.  On retroflexion internal hemorrhoids seen on pictures.  The polyp was a tubular adenoma. Admitted 11/17/2020 for Melena  , history of gastric sleeve surgery and then a redo duodenal switch 11/19/2020: EGD for dysphagia :Post surgical changes but no strictures. Colonoscopy was normal but prep was poor .     First round:02/11/2021: LL column banded     Interval history   02/11/2021-03/15/2021  Doing well after the last session of banding decreased rectal bleeding or discomfort.  Digital rectal exam performed in the presence of a chaperone. External anal findings: Prolapsing hemorrhoid in the right anterior location Internal findings: , No masses, no blood on glove noticed.    PROCEDURE NOTE: The patient presents with symptomatic grade 2 hemorrhoids, unresponsive to maximal medical therapy, requesting rubber band ligation of his/her hemorrhoidal disease.  All risks, benefits and alternative forms of therapy were described and informed consent was obtained.  In the Left Lateral Decubitus position (if anoscopy is performed) anoscopic examination revealed grade 2 hemorrhoids in the RA and RP position(s).   The decision was made to band the RP internal hemorrhoid, and the [place] was used to perform band ligation without complication.  Digital anorectal examination was then performed to assure proper positioning of the band, and to adjust the banded tissue as required.  The patient was discharged home without pain or other issues.  Dietary and behavioral recommendations were given and (if necessary - prescriptions were given),  along with follow-up instructions.  The patient will return 4 weeks for follow-up and possible additional banding as required.  No complications were encountered and the patient tolerated the procedure well.   Plan:  Avoid constipation.  Commence on stool softeners if not already on  Follow-up: 4 weeks  Dr Jonathon Bellows MD,MRCP [facility]) Gastroenterology/Hepatology Pager: [phone]

## 2021-03-19 ENCOUNTER — Ambulatory Visit (INDEPENDENT_AMBULATORY_CARE_PROVIDER_SITE_OTHER): Payer: Medicare Other | Admitting: Family

## 2021-03-19 ENCOUNTER — Encounter: Payer: Self-pay | Admitting: Family

## 2021-03-19 ENCOUNTER — Other Ambulatory Visit: Payer: Self-pay

## 2021-03-19 VITALS — BP 102/58 | HR 62 | Temp 98.5°F | Ht 60.0 in | Wt 180.9 lb

## 2021-03-19 DIAGNOSIS — D509 Iron deficiency anemia, unspecified: Secondary | ICD-10-CM | POA: Diagnosis not present

## 2021-03-19 DIAGNOSIS — G4733 Obstructive sleep apnea (adult) (pediatric): Secondary | ICD-10-CM

## 2021-03-19 DIAGNOSIS — D500 Iron deficiency anemia secondary to blood loss (chronic): Secondary | ICD-10-CM | POA: Diagnosis not present

## 2021-03-19 DIAGNOSIS — F419 Anxiety disorder, unspecified: Secondary | ICD-10-CM | POA: Diagnosis not present

## 2021-03-19 DIAGNOSIS — I10 Essential (primary) hypertension: Secondary | ICD-10-CM | POA: Diagnosis not present

## 2021-03-19 LAB — BASIC METABOLIC PANEL
BUN: 9 mg/dL (ref 6–23)
CO2: 28 mEq/L (ref 19–32)
Calcium: 9.3 mg/dL (ref 8.4–10.5)
Chloride: 100 mEq/L (ref 96–112)
Creatinine, Ser: 0.74 mg/dL (ref 0.40–1.20)
GFR: 81.3 mL/min (ref >60.00)
Glucose, Bld: 99 mg/dL (ref 70–99)
Potassium: 5 mEq/L (ref 3.5–5.1)
Sodium: 137 mEq/L (ref 135–145)

## 2021-03-19 LAB — CBC WITH DIFFERENTIAL/PLATELET
Basophils Absolute: 0.1 10*3/uL (ref 0.0–0.1)
Basophils Relative: 1.1 % (ref 0.0–3.0)
Eosinophils Absolute: 0 10*3/uL (ref 0.0–0.7)
Eosinophils Relative: 0.5 % (ref 0.0–5.0)
HCT: 32.4 % — ABNORMAL LOW (ref 36.0–46.0)
Hemoglobin: 10.1 g/dL — ABNORMAL LOW (ref 12.0–15.0)
Lymphocytes Relative: 49.9 % — ABNORMAL HIGH (ref 12.0–46.0)
Lymphs Abs: 2.6 10*3/uL (ref 0.7–4.0)
MCHC: 31.1 g/dL (ref 30.0–36.0)
MCV: 83.5 fl (ref 78.0–100.0)
Monocytes Absolute: 0.4 10*3/uL (ref 0.1–1.0)
Monocytes Relative: 8 % (ref 3.0–12.0)
Neutro Abs: 2.1 10*3/uL (ref 1.4–7.7)
Neutrophils Relative %: 40.5 % — ABNORMAL LOW (ref 43.0–77.0)
Platelets: 204 10*3/uL (ref 150.0–400.0)
RBC: 3.88 Mil/uL (ref 3.87–5.11)
RDW: 26.3 % — ABNORMAL HIGH (ref 11.5–15.5)
WBC: 5.1 10*3/uL (ref 4.0–10.5)

## 2021-03-19 LAB — IBC + FERRITIN
Ferritin: 47.9 ng/mL (ref 10.0–291.0)
Iron: 7 ug/dL — ABNORMAL LOW (ref 42–145)
Saturation Ratios: 1.6 % — ABNORMAL LOW (ref 20.0–50.0)
TIBC: 450.8 ug/dL — ABNORMAL HIGH (ref 250.0–450.0)
Transferrin: 322 mg/dL (ref 212.0–360.0)

## 2021-03-19 MED ORDER — HYDROCHLOROTHIAZIDE 12.5 MG PO CAPS: 12.5000 mg | ORAL_CAPSULE | Freq: Every day | ORAL | 0 refills | Status: DC

## 2021-03-19 MED ORDER — SERTRALINE HCL 50 MG PO TABS
50.0000 mg | ORAL_TABLET | Freq: Every day | ORAL | 3 refills | Status: DC
Start: 2021-03-19 — End: 2021-05-10

## 2021-03-19 NOTE — Assessment & Plan Note (Addendum)
Excellent control however low end of normal.  Unsure if blood pressure is contributing to fatigue.  We agreed to continue losartan 100 mg and decrease hydrochlorothiazide. Advised her to stop triamterene hydrochlorothiazide and start hydrochlorothiazide 12.5 mg alone.  ?

## 2021-03-19 NOTE — Patient Instructions (Addendum)
Start hctz to 12.5mg . STOP triamterene hctz combination pill.  ?Start sertraline 50mg  at night and stay there for a couple of weeks , then you may increase to 100mg  ? ?It is imperative that you are seen AT least twice per year for labs and monitoring. Monitor blood pressure at home and me 5-6 reading on separate days. Goal is less than 120/80, based on newest guidelines, however we certainly want to be less than 130/80;  if persistently higher, please make sooner follow up appointment so we can recheck you blood pressure and manage/ adjust medications. ? ?Please let me know any concerns or questions about medication changes advised.  I do not want you to be confused. ? ? ?Referral back to pulmonology as well. ?Let us know if you dont hear back within a week in regards to an appointment being scheduled.  ? ?

## 2021-03-19 NOTE — Progress Notes (Signed)
Subjective:    Patient ID: Jennifer [name], female    DOB: 1950-01-30, 71 y.o.   MRN: [id number]  CC: [name] is a 71 y.o. female who presents today for follow up.   HPI: Complains of fatigue.   Last iron infusion 02/17/21 and felt energy improved and now feeling more fatigue past 2 weeks.  She is compliant with cipap however condensation wakes her up 6-8 times per night. sleep is not restorative.  Rectal bleeding resolved. She takes zoloft prn She is not physically active.  She cooks and does light cleaning at home   4 months ago TSH 2.9   HTN- losartan 100mg , triamterene hydrochlorothiazide  37.5- 25mg  . No CP, sob.    follow-up Dr.Anna 03/15/2021 for internal hemorrhoids and banding.  Follow-up scheduled 05/10/2021 Follow up Dr Clayborn Bigness, cardiology 03/04/21 for obesity, palpitations, bradycardia. Pending echocardiogram next week. Reports wearing holter monitor for 3 days ( no result in epic) /  Follow-up with Dr. Tasia Catchings, hematology 02/09/2021 for iron deficient anemia due to chronic blood loss. HISTORY:  Past Medical History:  Diagnosis Date   Anemia    Anxiety    Bariatric surgery status    Constipation    COVID-19    Dysrhythmia    Elevated liver enzymes    GERD (gastroesophageal reflux disease)    Hemorrhoids    Herpes genitalis    High cholesterol    Hyperlipidemia    Hypertension    Hypothyroidism    IDA (iron deficiency anemia) 02/09/2021   Neuropathy    Osteoarthritis    Sleep apnea    Past Surgical History:  Procedure Laterality Date   ABDOMINAL HYSTERECTOMY     total for fibroids no h/o abnormal pap   bariatric sleeve  2015   BREAST EXCISIONAL BIOPSY Left 1998   carpal tunnel repair     COLONOSCOPY WITH PROPOFOL N/A 02/11/2016   Procedure: COLONOSCOPY WITH PROPOFOL;  Surgeon: Jonathon Bellows, MD;  Location: [facility] ENDOSCOPY;  Service: Endoscopy;  Laterality: N/A;   COLONOSCOPY WITH PROPOFOL N/A 10/01/2019   Procedure: COLONOSCOPY WITH PROPOFOL;  Surgeon:  Jonathon Bellows, MD;  Location: [facility] ENDOSCOPY;  Service: Gastroenterology;  Laterality: N/A;   COLONOSCOPY WITH PROPOFOL N/A 11/19/2020   Procedure: COLONOSCOPY WITH PROPOFOL;  Surgeon: Jonathon Bellows, MD;  Location: [facility] ENDOSCOPY;  Service: Gastroenterology;  Laterality: N/A;   ESOPHAGOGASTRODUODENOSCOPY (EGD) WITH PROPOFOL N/A 12/02/2019   Procedure: ESOPHAGOGASTRODUODENOSCOPY (EGD) WITH PROPOFOL;  Surgeon: Jonathon Bellows, MD;  Location: [facility] ENDOSCOPY;  Service: Gastroenterology;  Laterality: N/A;   ESOPHAGOGASTRODUODENOSCOPY (EGD) WITH PROPOFOL N/A 11/19/2020   Procedure: ESOPHAGOGASTRODUODENOSCOPY (EGD) WITH PROPOFOL;  Surgeon: Jonathon Bellows, MD;  Location: [facility] ENDOSCOPY;  Service: Gastroenterology;  Laterality: N/A;   FLEXIBLE SIGMOIDOSCOPY N/A 02/06/2021   Procedure: FLEXIBLE SIGMOIDOSCOPY;  Surgeon: [name], DO;  Location: [facility] ENDOSCOPY;  Service: Gastroenterology;  Laterality: N/A;   HEMORRHOID SURGERY     LAPAROSCOPIC GASTRIC RESTRICTIVE DUODENAL PROCEDURE (DUODENAL SWITCH) Bilateral    2020   Family History  Problem Relation Age of Onset   Breast cancer Sister 45       materal 1/2 sister   Hypertension Sister    Hypertension Mother    Heart disease Father    Hypertension Brother     Allergies: Celecoxib, Hydrocodone, Pollen extract, Sodium ferric gluconate [ferrous gluconate], Nasacort [triamcinolone], and Vicodin [hydrocodone-acetaminophen] Current Outpatient Medications on File Prior to Visit  Medication Sig Dispense Refill   acetaminophen (TYLENOL) 325 MG tablet Take 2 tablets (650 mg total) by  mouth every 6 (six) hours as needed for mild pain (or Fever >/= 101).     acyclovir (ZOVIRAX) 400 MG tablet TAKE 1 TABLET BY MOUTH EVERY DAY 30 tablet 5   Ascorbic Acid (VITAMIN C PO) Take by mouth.     calcium citrate (CALCITRATE - DOSED IN MG ELEMENTAL CALCIUM) 950 (200 Ca) MG tablet Take 200 mg of elemental calcium by mouth daily.     erythromycin ophthalmic ointment Use  one half inch four times daily to affected eye (s) x 7 days. 3.5 g 0   famotidine (PEPCID) 40 MG tablet Take 40 mg by mouth daily.     fluticasone (FLONASE) 50 MCG/ACT nasal spray Place 2 sprays into both nostrils daily. 16 g 0   gabapentin (NEURONTIN) 300 MG capsule Take 1 capsule (300 mg total) by mouth 2 (two) times daily. 120 capsule 2   hydrocortisone (ANUSOL-HC) 25 MG suppository Place 1 suppository (25 mg total) rectally 2 (two) times daily. 12 suppository 1   ketoconazole (NIZORAL) 2 % cream Apply 1 application topically daily. Apply on bottom of foot and thin layer between toes 60 g 2   levothyroxine (SYNTHROID) 50 MCG tablet TAKE 1 TABLET BY MOUTH EVERY DAY 30 tablet 5   losartan (COZAAR) 100 MG tablet TAKE 1 TABLET BY MOUTH EVERY DAY AT BEDTIME 90 tablet 0   montelukast (SINGULAIR) 10 MG tablet Take 1 tablet (10 mg total) by mouth at bedtime. 90 tablet 1   Multiple Vitamin (MULTIVITAMIN WITH MINERALS) TABS tablet Take 1 tablet by mouth daily.     Multiple Vitamins-Minerals (ZINC PO) Take by mouth.     sodium chloride (OCEAN) 0.65 % SOLN nasal spray Place 2 sprays into both nostrils as needed for congestion. 30 mL 2   No current facility-administered medications on file prior to visit.    Social History   Tobacco Use   Smoking status: Former    Packs/day: 1.50    Years: 24.00    Pack years: 36.00    Types: Cigarettes    Quit date: 1993    Years since quitting: 30.1   Smokeless tobacco: Never   Tobacco comments:    quit 1995.   Vaping Use   Vaping Use: Never used  Substance Use Topics   Alcohol use: Not Currently   Drug use: No    Review of Systems  Constitutional:  Positive for fatigue. Negative for chills and fever.  HENT:  Negative for congestion.   Respiratory:  Negative for cough.   Cardiovascular:  Negative for chest pain and palpitations.  Gastrointestinal:  Negative for blood in stool (resolved), nausea and vomiting.  Psychiatric/Behavioral:  Positive for  sleep disturbance.      Objective:    BP (!) 102/58 (BP Location: Left Arm, Patient Position: Sitting, Cuff Size: Large)    Pulse 62    Temp 98.5 F (36.9 C) (Oral)    Ht 5' (1.524 m)    Wt 180 lb 14.4 oz (82.1 kg)    SpO2 98%    BMI 35.33 kg/m  BP Readings from Last 3 Encounters:  03/19/21 (!) 102/58  03/15/21 (!) 169/83  02/17/21 131/78   Wt Readings from Last 3 Encounters:  03/19/21 180 lb 14.4 oz (82.1 kg)  03/15/21 179 lb (81.2 kg)  02/16/21 182 lb 3.2 oz (82.6 kg)    Physical Exam Vitals reviewed.  Constitutional:      Appearance: She is well-developed.  Eyes:     Conjunctiva/sclera: Conjunctivae normal.  Cardiovascular:     Rate and Rhythm: Normal rate and regular rhythm.     Pulses: Normal pulses.     Heart sounds: Normal heart sounds.  Pulmonary:     Effort: Pulmonary effort is normal.     Breath sounds: Normal breath sounds. No wheezing, rhonchi or rales.  Skin:    General: Skin is warm and dry.  Neurological:     Mental Status: She is alert.  Psychiatric:        Speech: Speech normal.        Behavior: Behavior normal.        Thought Content: Thought content normal.       Assessment & Plan:   Problem List Items Addressed This Visit       Cardiovascular and Mediastinum   Essential hypertension    Excellent control however low end of normal.  Unsure if blood pressure is contributing to fatigue.  We agreed to continue losartan 100 mg and decrease hydrochlorothiazide. Advised her to stop triamterene hydrochlorothiazide and start hydrochlorothiazide 12.5 mg alone.       Relevant Medications   hydrochlorothiazide (MICROZIDE) 12.5 MG capsule   Other Relevant Orders   Basic metabolic panel     Respiratory   OSA (obstructive sleep apnea)    Referral back to pulmonology as concerned that she may need to alter her CPAP equipment as she is having to take her mask off several times at night for condensation.  This is quite disruptive for sleep.  Referral back  to pulmonology to further evaluate, offer suggestions.      Relevant Orders   Ambulatory referral to Pulmonology     Other   Anxiety    Previously taking Zoloft as as needed.  Educated patient that Zoloft is not an as needed medication and more effective when taken nightly to aid with sleep.  She will restart Zoloft 50 mg qhs.  We will follow      Relevant Medications   sertraline (ZOLOFT) 50 MG tablet   Chronic iron deficiency anemia - Primary   Relevant Orders   CBC with Differential/Platelet   IBC + Ferritin   IDA (iron deficiency anemia)    Recent history of anemia.  Rectal bleeding is fortunately resolved.  She is upcoming follow-up with Dr. Tasia Catchings next month. Will follow.   Pending iron stores studies today.         I have discontinued Raela Dirden's sertraline, olopatadine, pantoprazole, prednisoLONE acetate, and triamterene-hydrochlorothiazide. I am also having her start on hydrochlorothiazide and sertraline. Additionally, I am having her maintain her acetaminophen, gabapentin, hydrocortisone, multivitamin with minerals, calcium citrate, losartan, montelukast, Ascorbic Acid (VITAMIN C PO), Multiple Vitamins-Minerals (ZINC PO), sodium chloride, fluticasone, famotidine, ketoconazole, erythromycin, acyclovir, and levothyroxine.   Meds ordered this encounter  Medications   hydrochlorothiazide (MICROZIDE) 12.5 MG capsule    Sig: Take 1 capsule (12.5 mg total) by mouth daily.    Dispense:  90 capsule    Refill:  0    Order Specific Question:   Supervising Provider    Answer:   Deborra Medina L [[id number]]   sertraline (ZOLOFT) 50 MG tablet    Sig: Take 1 tablet (50 mg total) by mouth at bedtime.    Dispense:  90 tablet    Refill:  3    Order Specific Question:   Supervising Provider    Answer:   Crecencio Mc [[id number]]    Return precautions given.   Risks, benefits, and alternatives of the  medications and treatment plan prescribed today were discussed, and patient expressed  understanding.   Education regarding symptom management and diagnosis given to patient on AVS.  Continue to follow with [name], FNP for routine health maintenance.   Jennifer [name] and I agreed with plan.   [name], FNP

## 2021-03-19 NOTE — Assessment & Plan Note (Addendum)
Recent history of anemia.  Rectal bleeding is fortunately resolved.  She is upcoming follow-up with Dr. Tasia Catchings next month. Will follow.   Pending iron stores studies today.  ?

## 2021-03-19 NOTE — Assessment & Plan Note (Signed)
Previously taking Zoloft as as needed.  Educated patient that Zoloft is not an as needed medication and more effective when taken nightly to aid with sleep.  She will restart Zoloft 50 mg qhs.  We will follow ?

## 2021-03-19 NOTE — Assessment & Plan Note (Signed)
Referral back to pulmonology as concerned that she may need to alter her CPAP equipment as she is having to take her mask off several times at night for condensation.  This is quite disruptive for sleep.  Referral back to pulmonology to further evaluate, offer suggestions. ?

## 2021-03-21 ENCOUNTER — Other Ambulatory Visit: Payer: Self-pay | Admitting: Family

## 2021-03-21 DIAGNOSIS — E039 Hypothyroidism, unspecified: Secondary | ICD-10-CM

## 2021-03-25 DIAGNOSIS — R000 Tachycardia, unspecified: Secondary | ICD-10-CM | POA: Diagnosis not present

## 2021-03-25 DIAGNOSIS — R9431 Abnormal electrocardiogram [ECG] [EKG]: Secondary | ICD-10-CM | POA: Diagnosis not present

## 2021-03-25 DIAGNOSIS — R002 Palpitations: Secondary | ICD-10-CM | POA: Diagnosis not present

## 2021-04-07 ENCOUNTER — Encounter: Payer: Self-pay | Admitting: Oncology

## 2021-04-07 ENCOUNTER — Other Ambulatory Visit: Payer: Self-pay

## 2021-04-07 ENCOUNTER — Encounter: Payer: Self-pay | Admitting: Family

## 2021-04-07 ENCOUNTER — Ambulatory Visit (INDEPENDENT_AMBULATORY_CARE_PROVIDER_SITE_OTHER): Payer: Medicare Other | Admitting: Family

## 2021-04-07 VITALS — BP 130/76 | HR 84 | Temp 98.2°F | Ht 61.0 in | Wt 179.2 lb

## 2021-04-07 DIAGNOSIS — F32A Depression, unspecified: Secondary | ICD-10-CM

## 2021-04-07 DIAGNOSIS — I10 Essential (primary) hypertension: Secondary | ICD-10-CM

## 2021-04-07 DIAGNOSIS — F419 Anxiety disorder, unspecified: Secondary | ICD-10-CM

## 2021-04-07 DIAGNOSIS — G629 Polyneuropathy, unspecified: Secondary | ICD-10-CM | POA: Diagnosis not present

## 2021-04-07 MED ORDER — TRIAMTERENE-HCTZ 37.5-25 MG PO CAPS
1.0000 | ORAL_CAPSULE | Freq: Every day | ORAL | 1 refills | Status: DC
Start: 2021-04-07 — End: 2021-06-10

## 2021-04-07 MED ORDER — GABAPENTIN 300 MG PO CAPS: 300.0000 mg | ORAL_CAPSULE | Freq: Three times a day (TID) | ORAL | 2 refills | Status: DC

## 2021-04-07 NOTE — Patient Instructions (Addendum)
Try taking zoloft EVERY NIGHT. It is not an as needed medication.  ? ?Trial increasing gabapentin 300 mg to 3 times per day for numbness and.  As discussed, this medication can be sedating so please be very cautious and see if you notice any change in fatigue or certainly feel more sedated. ? ?You may stop hydrochlorothiazide 12.5 mg ?Have sent in a new prescription of previous blood pressure medication,triamterene hydrochlorothiazide 37.5-25 mg, due to leg swelling ? ?Nice to see you! ? ?

## 2021-04-07 NOTE — Progress Notes (Signed)
? ?Subjective:  ? ? Patient ID: Jennifer [name], female    DOB: [birth year], 71 y.o.   MRN: [id number] ? ?CC: Jennifer [name] is a 71 y.o. female who presents today for follow up.  ? ?HPI: HPI ?Fatigue is unchanged. Fatigue 'is intermittent'. Fatigue improves with activity.  She is not sleeping well at night. No formal exercise. She will do house work such as sweeping, vacuuming. No cp, sob, dizziness, syncope.  ?Peripheral neuropathy - some breakthrough and pain in bilateral toes. Compliant with gabapentin '300mg'$  BID. Not too sedating.  ?Hypertension-trial stop a triamterene- hydrochlorothiazide and started for thiazide 12.5 mg due hypotension, fatigue.  She remains compliant with losartan 100 mg. She has noticed more leg swelling.  Previously she had been on triamterene hydrochlorothiazide 37.5-25 mg ?OSA-referral back to pulmonology ?Anxiety-restarted Zoloft 50 mg but taking prn.  ?IDA-she has follow-up scheduled with Dr. Tasia Catchings, hematology, 05/13/21 ?Follow-up scheduled with pulmonology, Geraldo Pitter 05/12/21  ?HISTORY:  ?Past Medical History:  ?Diagnosis Date  ? Anemia   ? Anxiety   ? Bariatric surgery status   ? Constipation   ? COVID-19   ? Dysrhythmia   ? Elevated liver enzymes   ? GERD (gastroesophageal reflux disease)   ? Hemorrhoids   ? Herpes genitalis   ? High cholesterol   ? Hyperlipidemia   ? Hypertension   ? Hypothyroidism   ? IDA (iron deficiency anemia) 02/09/2021  ? Neuropathy   ? Osteoarthritis   ? Sleep apnea   ? ?Past Surgical History:  ?Procedure Laterality Date  ? ABDOMINAL HYSTERECTOMY    ? total for fibroids no h/o abnormal pap  ? bariatric sleeve  2015  ? BREAST EXCISIONAL BIOPSY Left 1998  ? carpal tunnel repair    ? COLONOSCOPY WITH PROPOFOL N/A 02/11/2016  ? Procedure: COLONOSCOPY WITH PROPOFOL;  Surgeon: Jonathon Bellows, MD;  Location: [facility] ENDOSCOPY;  Service: Endoscopy;  Laterality: N/A;  ? COLONOSCOPY WITH PROPOFOL N/A 10/01/2019  ? Procedure: COLONOSCOPY WITH PROPOFOL;  Surgeon: Jonathon Bellows, MD;  Location: [facility] ENDOSCOPY;  Service: Gastroenterology;  Laterality: N/A;  ? COLONOSCOPY WITH PROPOFOL N/A 11/19/2020  ? Procedure: COLONOSCOPY WITH PROPOFOL;  Surgeon: Jonathon Bellows, MD;  Location: [facility] ENDOSCOPY;  Service: Gastroenterology;  Laterality: N/A;  ? ESOPHAGOGASTRODUODENOSCOPY (EGD) WITH PROPOFOL N/A 12/02/2019  ? Procedure: ESOPHAGOGASTRODUODENOSCOPY (EGD) WITH PROPOFOL;  Surgeon: Jonathon Bellows, MD;  Location: [facility] ENDOSCOPY;  Service: Gastroenterology;  Laterality: N/A;  ? ESOPHAGOGASTRODUODENOSCOPY (EGD) WITH PROPOFOL N/A 11/19/2020  ? Procedure: ESOPHAGOGASTRODUODENOSCOPY (EGD) WITH PROPOFOL;  Surgeon: Jonathon Bellows, MD;  Location: [facility] ENDOSCOPY;  Service: Gastroenterology;  Laterality: N/A;  ? FLEXIBLE SIGMOIDOSCOPY N/A 02/06/2021  ? Procedure: FLEXIBLE SIGMOIDOSCOPY;  Surgeon: [name], DO;  Location: [facility] ENDOSCOPY;  Service: Gastroenterology;  Laterality: N/A;  ? HEMORRHOID SURGERY    ? LAPAROSCOPIC GASTRIC RESTRICTIVE DUODENAL PROCEDURE (DUODENAL SWITCH) Bilateral   ? 2020  ? ?Family History  ?Problem Relation Age of Onset  ? Breast cancer Sister 76  ?     materal 1/2 sister  ? Hypertension Sister   ? Hypertension Mother   ? Heart disease Father   ? Hypertension Brother   ? ? ?Allergies: Celecoxib, Hydrocodone, Pollen extract, Sodium ferric gluconate [ferrous gluconate], Nasacort [triamcinolone], and Vicodin [hydrocodone-acetaminophen] ?Current Outpatient Medications on File Prior to Visit  ?Medication Sig Dispense Refill  ? acetaminophen (TYLENOL) 325 MG tablet Take 2 tablets (650 mg total) by mouth every 6 (six) hours as needed for mild pain (or Fever >/= 101).    ?  acyclovir (ZOVIRAX) 400 MG tablet TAKE 1 TABLET BY MOUTH EVERY DAY 30 tablet 5  ? Ascorbic Acid (VITAMIN C PO) Take by mouth.    ? calcium citrate (CALCITRATE - DOSED IN MG ELEMENTAL CALCIUM) 950 (200 Ca) MG tablet Take 200 mg of elemental calcium by mouth daily.    ? famotidine (PEPCID) 40 MG tablet Take 40 mg  by mouth daily.    ? fluticasone (FLONASE) 50 MCG/ACT nasal spray Place 2 sprays into both nostrils daily. 16 g 0  ? hydrocortisone (ANUSOL-HC) 25 MG suppository Place 1 suppository (25 mg total) rectally 2 (two) times daily. 12 suppository 1  ? ketoconazole (NIZORAL) 2 % cream Apply 1 application topically daily. Apply on bottom of foot and thin layer between toes 60 g 2  ? levothyroxine (SYNTHROID) 50 MCG tablet TAKE 1 TABLET BY MOUTH EVERY DAY 90 tablet 1  ? losartan (COZAAR) 100 MG tablet TAKE 1 TABLET BY MOUTH EVERY DAY AT BEDTIME 90 tablet 0  ? montelukast (SINGULAIR) 10 MG tablet Take 1 tablet (10 mg total) by mouth at bedtime. 90 tablet 1  ? Multiple Vitamin (MULTIVITAMIN WITH MINERALS) TABS tablet Take 1 tablet by mouth daily.    ? Multiple Vitamins-Minerals (ZINC PO) Take by mouth.    ? sertraline (ZOLOFT) 50 MG tablet Take 1 tablet (50 mg total) by mouth at bedtime. 90 tablet 3  ? sodium chloride (OCEAN) 0.65 % SOLN nasal spray Place 2 sprays into both nostrils as needed for congestion. 30 mL 2  ? erythromycin ophthalmic ointment Use one half inch four times daily to affected eye (s) x 7 days. 3.5 g 0  ? ?No current facility-administered medications on file prior to visit.  ? ? ?Social History  ? ?Tobacco Use  ? Smoking status: Former  ?  Packs/day: 1.50  ?  Years: 24.00  ?  Pack years: 36.00  ?  Types: Cigarettes  ?  Quit date: 49  ?  Years since quitting: 30.2  ? Smokeless tobacco: Never  ? Tobacco comments:  ?  quit 1995.   ?Vaping Use  ? Vaping Use: Never used  ?Substance Use Topics  ? Alcohol use: Not Currently  ? Drug use: No  ? ? ?Review of Systems  ?Constitutional:  Positive for fatigue (unchanged). Negative for chills and fever.  ?Respiratory:  Negative for cough.   ?Cardiovascular:  Negative for chest pain and palpitations.  ?Gastrointestinal:  Negative for nausea and vomiting.  ?   ?Objective:  ?  ?BP 130/76 (BP Location: Left Arm, Patient Position: Sitting, Cuff Size: Normal)   Pulse 84    Temp 98.2 ?F (36.8 ?C) (Oral)   Ht '5\' 1"'$  (1.549 m)   Wt 179 lb 3.2 oz (81.3 kg)   SpO2 96%   BMI 33.86 kg/m?  ?BP Readings from Last 3 Encounters:  ?04/07/21 130/76  ?03/19/21 (!) 102/58  ?03/15/21 (!) 169/83  ? ?Wt Readings from Last 3 Encounters:  ?04/07/21 179 lb 3.2 oz (81.3 kg)  ?03/19/21 180 lb 14.4 oz (82.1 kg)  ?03/15/21 179 lb (81.2 kg)  ? ? ?Physical Exam ?Vitals reviewed.  ?Constitutional:   ?   Appearance: She is well-developed.  ?Eyes:  ?   Conjunctiva/sclera: Conjunctivae normal.  ?Cardiovascular:  ?   Rate and Rhythm: Normal rate and regular rhythm.  ?   Pulses: Normal pulses.  ?   Heart sounds: Normal heart sounds.  ?Pulmonary:  ?   Effort: Pulmonary effort is normal.  ?  Breath sounds: Normal breath sounds. No wheezing, rhonchi or rales.  ?Skin: ?   General: Skin is warm and dry.  ?Neurological:  ?   Mental Status: She is alert.  ?Psychiatric:     ?   Speech: Speech normal.     ?   Behavior: Behavior normal.     ?   Thought Content: Thought content normal.  ? ? ?   ?Assessment & Plan:  ? ?Problem List Items Addressed This Visit   ? ?  ? Cardiovascular and Mediastinum  ? Essential hypertension - Primary  ?  Chronic, stable however with recent leg swelling, we have opted to return to triamterene hydrochlorothiazide 37.5-25 mg. Stop hctz 12.'5mg'$ . Continue losartan '100mg'$  ?  ?  ? Relevant Medications  ? triamterene-hydrochlorothiazide (DYAZIDE) 37.5-25 MG capsule  ?  ? Nervous and Auditory  ? Neuropathy  ?  Suboptimal control. Increase gabapentin to '300mg'$  TID. She will monitor for increase in fatigue or sedation.  ?  ?  ? Relevant Medications  ? gabapentin (NEURONTIN) 300 MG capsule  ?  ? Other  ? Anxiety and depression  ?  Supoptimal control. She has been taking zoloft '50mg'$  prn. Counseled  her that zoloft is a daily medication. She will start taking daily to see if sleep quality improves.  ?  ?  ? ? ? ?I have discontinued Aizlyn Yebra's hydrochlorothiazide. I have also changed her gabapentin.  Additionally, I am having her start on triamterene-hydrochlorothiazide. Lastly, I am having her maintain her acetaminophen, hydrocortisone, multivitamin with minerals, calcium citrate, losartan, montelukast, Asco

## 2021-04-07 NOTE — Assessment & Plan Note (Signed)
Suboptimal control. Increase gabapentin to '300mg'$  TID. She will monitor for increase in fatigue or sedation.  ?

## 2021-04-07 NOTE — Assessment & Plan Note (Signed)
Chronic, stable however with recent leg swelling, we have opted to return to triamterene hydrochlorothiazide 37.5-25 mg. Stop hctz 12.'5mg'$ . Continue losartan '100mg'$  ?

## 2021-04-07 NOTE — Assessment & Plan Note (Addendum)
Supoptimal control. She has been taking zoloft '50mg'$  prn. Counseled  her that zoloft is a daily medication. She will start taking daily to see if sleep quality improves.  ?

## 2021-04-13 ENCOUNTER — Other Ambulatory Visit: Payer: Self-pay

## 2021-04-13 ENCOUNTER — Ambulatory Visit (INDEPENDENT_AMBULATORY_CARE_PROVIDER_SITE_OTHER): Payer: Medicare Other | Admitting: Podiatry

## 2021-04-13 DIAGNOSIS — S90822A Blister (nonthermal), left foot, initial encounter: Secondary | ICD-10-CM | POA: Diagnosis not present

## 2021-04-13 DIAGNOSIS — E1142 Type 2 diabetes mellitus with diabetic polyneuropathy: Secondary | ICD-10-CM | POA: Diagnosis not present

## 2021-04-13 MED ORDER — DOXYCYCLINE HYCLATE 100 MG PO TABS: 100.0000 mg | ORAL_TABLET | Freq: Two times a day (BID) | ORAL | 0 refills | Status: AC

## 2021-04-13 NOTE — Progress Notes (Signed)
?Subjective:  ?Patient ID: Jennifer [name], female    DOB: 1950/04/17,  MRN: [id number] ? ?Chief Complaint  ?Patient presents with  ? Blister  ?  Right heel blister pt stated that it started about 3 days ago and has gotten bigger she stated that she has been treated for this before and is unsure why it keeps coming back   ? ? ?71 y.o. female presents with the above complaint.  Patient presents with left heel blister that started about 3 days ago has gotten bigger and much more painful.  She states is being treated in the past for this.  She does not recall why is coming back.  She states been hurting for quite some time.  She would like to have it drained out.  She is a diabetic with last A1c of 5.5.  She denies any other acute complaint she is not taking any antibiotics. ? ? ?Review of Systems: Negative except as noted in the HPI. Denies N/V/F/Ch. ? ?Past Medical History:  ?Diagnosis Date  ? Anemia   ? Anxiety   ? Bariatric surgery status   ? Constipation   ? COVID-19   ? Dysrhythmia   ? Elevated liver enzymes   ? GERD (gastroesophageal reflux disease)   ? Hemorrhoids   ? Herpes genitalis   ? High cholesterol   ? Hyperlipidemia   ? Hypertension   ? Hypothyroidism   ? IDA (iron deficiency anemia) 02/09/2021  ? Neuropathy   ? Osteoarthritis   ? Sleep apnea   ? ? ?Current Outpatient Medications:  ?  doxycycline (VIBRA-TABS) 100 MG tablet, Take 1 tablet (100 mg total) by mouth 2 (two) times daily for 14 days., Disp: 28 tablet, Rfl: 0 ?  acetaminophen (TYLENOL) 325 MG tablet, Take 2 tablets (650 mg total) by mouth every 6 (six) hours as needed for mild pain (or Fever >/= 101)., Disp: , Rfl:  ?  acyclovir (ZOVIRAX) 400 MG tablet, TAKE 1 TABLET BY MOUTH EVERY DAY, Disp: 30 tablet, Rfl: 5 ?  Ascorbic Acid (VITAMIN C PO), Take by mouth., Disp: , Rfl:  ?  calcium citrate (CALCITRATE - DOSED IN MG ELEMENTAL CALCIUM) 950 (200 Ca) MG tablet, Take 200 mg of elemental calcium by mouth daily., Disp: , Rfl:  ?  erythromycin  ophthalmic ointment, Use one half inch four times daily to affected eye (s) x 7 days., Disp: 3.5 g, Rfl: 0 ?  famotidine (PEPCID) 40 MG tablet, Take 40 mg by mouth daily., Disp: , Rfl:  ?  fluticasone (FLONASE) 50 MCG/ACT nasal spray, Place 2 sprays into both nostrils daily., Disp: 16 g, Rfl: 0 ?  gabapentin (NEURONTIN) 300 MG capsule, Take 1 capsule (300 mg total) by mouth 3 (three) times daily., Disp: 180 capsule, Rfl: 2 ?  hydrocortisone (ANUSOL-HC) 25 MG suppository, Place 1 suppository (25 mg total) rectally 2 (two) times daily., Disp: 12 suppository, Rfl: 1 ?  ketoconazole (NIZORAL) 2 % cream, Apply 1 application topically daily. Apply on bottom of foot and thin layer between toes, Disp: 60 g, Rfl: 2 ?  levothyroxine (SYNTHROID) 50 MCG tablet, TAKE 1 TABLET BY MOUTH EVERY DAY, Disp: 90 tablet, Rfl: 1 ?  losartan (COZAAR) 100 MG tablet, TAKE 1 TABLET BY MOUTH EVERY DAY AT BEDTIME, Disp: 90 tablet, Rfl: 0 ?  montelukast (SINGULAIR) 10 MG tablet, Take 1 tablet (10 mg total) by mouth at bedtime., Disp: 90 tablet, Rfl: 1 ?  Multiple Vitamin (MULTIVITAMIN WITH MINERALS) TABS tablet, Take 1 tablet by  mouth daily., Disp: , Rfl:  ?  Multiple Vitamins-Minerals (ZINC PO), Take by mouth., Disp: , Rfl:  ?  sertraline (ZOLOFT) 50 MG tablet, Take 1 tablet (50 mg total) by mouth at bedtime., Disp: 90 tablet, Rfl: 3 ?  sodium chloride (OCEAN) 0.65 % SOLN nasal spray, Place 2 sprays into both nostrils as needed for congestion., Disp: 30 mL, Rfl: 2 ?  triamterene-hydrochlorothiazide (DYAZIDE) 37.5-25 MG capsule, Take 1 each (1 capsule total) by mouth daily., Disp: 90 capsule, Rfl: 1 ? ?Social History  ? ?Tobacco Use  ?Smoking Status Former  ? Packs/day: 1.50  ? Years: 24.00  ? Pack years: 36.00  ? Types: Cigarettes  ? Quit date: 66  ? Years since quitting: 30.2  ?Smokeless Tobacco Never  ?Tobacco Comments  ? quit 1995.   ? ? ?Allergies  ?Allergen Reactions  ? Celecoxib Hives  ? Hydrocodone Nausea Only  ? Pollen Extract   ?  Sodium Ferric Gluconate [Ferrous Gluconate]   ?  IV ferric gluconate - shortly after the infusion developed back pain shooting into left arm and bilateral hand swelling  ? Nasacort [Triamcinolone] Other (See Comments)  ?  Nasal - Nose Bleeds  ? Vicodin [Hydrocodone-Acetaminophen] Nausea Only  ? ?Objective:  ?There were no vitals filed for this visit. ?There is no height or weight on file to calculate BMI. ?Constitutional Well developed. ?Well nourished.  ?Vascular Dorsalis pedis pulses palpable bilaterally. ?Posterior tibial pulses palpable bilaterally. ?Capillary refill normal to all digits.  ?No cyanosis or clubbing noted. ?Pedal hair growth normal.  ?Neurologic Normal speech. ?Oriented to person, place, and time. ?Epicritic sensation to light touch grossly present bilaterally.  ?Dermatologic Blister formation noted with about 5 cc of serosanguineous fluid that was drained with lancing of the blister.  Pain on palpation to the blister.  No signs of purulent drainage noted.  Mild redness noted circumferential around the blister.  ?Orthopedic: Normal joint ROM without pain or crepitus bilaterally. ?No visible deformities. ?No bony tenderness.  ? ?Radiographs: None ?Assessment:  ? ?1. Diabetic polyneuropathy associated with type 2 diabetes mellitus ([place])   ?2. Blister of left heel, initial encounter   ? ?Plan:  ?Patient was evaluated and treated and all questions answered. ? ?Left heel blister with underlying history of diabetes ?-All questions and concerns were discussed with the patient in extensive detail. ?-I discussed with the patient to remove friction which could be between the shoes and the socks versus socks in the foot.  I discussed with the patient in extensive detail she states she will be on the look out for most causing the infection.  I lanced the blister into drain serous fluid.  No purulent drainage noted. ?-I will place her on doxycycline for 10 days prophylaxis to help with some of the  circumferential redness ?-Betadine wet-to-dry dressing change daily ? ?No follow-ups on file. ?

## 2021-04-24 ENCOUNTER — Other Ambulatory Visit: Payer: Self-pay | Admitting: Family

## 2021-04-26 DIAGNOSIS — M47816 Spondylosis without myelopathy or radiculopathy, lumbar region: Secondary | ICD-10-CM | POA: Diagnosis not present

## 2021-04-26 DIAGNOSIS — M25512 Pain in left shoulder: Secondary | ICD-10-CM | POA: Diagnosis not present

## 2021-04-26 DIAGNOSIS — M25511 Pain in right shoulder: Secondary | ICD-10-CM | POA: Diagnosis not present

## 2021-04-26 DIAGNOSIS — G8929 Other chronic pain: Secondary | ICD-10-CM | POA: Diagnosis not present

## 2021-04-26 DIAGNOSIS — M7582 Other shoulder lesions, left shoulder: Secondary | ICD-10-CM | POA: Diagnosis not present

## 2021-04-26 DIAGNOSIS — M7581 Other shoulder lesions, right shoulder: Secondary | ICD-10-CM | POA: Diagnosis not present

## 2021-04-26 DIAGNOSIS — G6289 Other specified polyneuropathies: Secondary | ICD-10-CM | POA: Diagnosis not present

## 2021-05-04 ENCOUNTER — Telehealth: Payer: Self-pay | Admitting: Diagnostic Neuroimaging

## 2021-05-04 ENCOUNTER — Ambulatory Visit (INDEPENDENT_AMBULATORY_CARE_PROVIDER_SITE_OTHER): Payer: Medicare Other | Admitting: Diagnostic Neuroimaging

## 2021-05-04 ENCOUNTER — Encounter: Payer: Self-pay | Admitting: Diagnostic Neuroimaging

## 2021-05-04 VITALS — BP 148/82 | HR 80 | Ht 61.0 in | Wt 182.4 lb

## 2021-05-04 DIAGNOSIS — E237 Disorder of pituitary gland, unspecified: Secondary | ICD-10-CM

## 2021-05-04 DIAGNOSIS — G629 Polyneuropathy, unspecified: Secondary | ICD-10-CM | POA: Diagnosis not present

## 2021-05-04 NOTE — Progress Notes (Signed)
? ?[facility] ? ?PATIENT: Jennifer [name] ?DOB: 1950-07-21 ? ?REFERRING CLINICIAN: [name], FNP and C Vaught,MD ?HISTORY FROM: patient  ?REASON FOR VISIT: follow up ? ? ?HISTORICAL ? ?CHIEF COMPLAINT:  ?Chief Complaint  ?Patient presents with  ? Follow-up  ?  Rm 7 here for 6 month f/u. Pt reports she has been doing well since last visit.   ? ? ?HISTORY OF PRESENT ILLNESS:  ? ?UPDATE (05/04/21, [facility]): Since last visit, doing about the same. Symptoms are stable. No alleviating or aggravating factors. Some intermittent shooting pains in toes. Needs pituitary lesion follow up. ? ?PRIOR HPI (10/14/20): 71 year old female here for evaluation of neuropathy. ? ?Patient had gastric sleeve bariatric surgery procedure in 2015.  Around 2018 she started to have numbness in the bottom of her feet and toes.  She feels like she is walking on rocks or fluid.  She is feeling slightly off balance.  This is gradually progressed over time.  She has some discomfort on the right lateral lower leg below the knee.  She tried gabapentin without relief.  She has some chronic low back pain without radiating symptoms.  She had MRI of the lumbar spine recently which showed some mild degenerative changes. ? ?Separately patient had some ringing in the ears and went to ENT.  She had MRI of the brain which was unremarkable but showed an incidental pituitary lesion measuring 3 mm. ? ? ?REVIEW OF SYSTEMS: Full 14 system review of systems performed and negative with exception of: As per HPI. ? ?ALLERGIES: ?Allergies  ?Allergen Reactions  ? Celecoxib Hives  ? Hydrocodone Nausea Only  ? Pollen Extract   ? Sodium Ferric Gluconate [Ferrous Gluconate]   ?  IV ferric gluconate - shortly after the infusion developed back pain shooting into left arm and bilateral hand swelling  ? Nasacort [Triamcinolone] Other (See Comments)  ?  Nasal - Nose Bleeds  ? Vicodin [Hydrocodone-Acetaminophen] Nausea Only  ? ? ?HOME  MEDICATIONS: ?Outpatient Medications Prior to Visit  ?Medication Sig Dispense Refill  ? acetaminophen (TYLENOL) 325 MG tablet Take 2 tablets (650 mg total) by mouth every 6 (six) hours as needed for mild pain (or Fever >/= 101).    ? acyclovir (ZOVIRAX) 400 MG tablet TAKE 1 TABLET BY MOUTH EVERY DAY 30 tablet 5  ? Ascorbic Acid (VITAMIN C PO) Take by mouth.    ? calcium citrate (CALCITRATE - DOSED IN MG ELEMENTAL CALCIUM) 950 (200 Ca) MG tablet Take 200 mg of elemental calcium by mouth daily.    ? famotidine (PEPCID) 40 MG tablet Take 40 mg by mouth daily.    ? fluticasone (FLONASE) 50 MCG/ACT nasal spray Place 2 sprays into both nostrils daily. 16 g 0  ? gabapentin (NEURONTIN) 300 MG capsule Take 1 capsule (300 mg total) by mouth 3 (three) times daily. 180 capsule 2  ? hydrocortisone (ANUSOL-HC) 25 MG suppository Place 1 suppository (25 mg total) rectally 2 (two) times daily. 12 suppository 1  ? ketoconazole (NIZORAL) 2 % cream Apply 1 application topically daily. Apply on bottom of foot and thin layer between toes 60 g 2  ? levothyroxine (SYNTHROID) 50 MCG tablet TAKE 1 TABLET BY MOUTH EVERY DAY 90 tablet 1  ? losartan (COZAAR) 100 MG tablet TAKE 1 TABLET BY MOUTH EVERY DAY AT BEDTIME 30 tablet 2  ? montelukast (SINGULAIR) 10 MG tablet Take 1 tablet (10 mg total) by mouth at bedtime. 90 tablet 1  ? Multiple Vitamin (MULTIVITAMIN WITH MINERALS)  TABS tablet Take 1 tablet by mouth daily.    ? Multiple Vitamins-Minerals (ZINC PO) Take by mouth.    ? sertraline (ZOLOFT) 50 MG tablet Take 1 tablet (50 mg total) by mouth at bedtime. 90 tablet 3  ? sodium chloride (OCEAN) 0.65 % SOLN nasal spray Place 2 sprays into both nostrils as needed for congestion. 30 mL 2  ? triamterene-hydrochlorothiazide (DYAZIDE) 37.5-25 MG capsule Take 1 each (1 capsule total) by mouth daily. 90 capsule 1  ? erythromycin ophthalmic ointment Use one half inch four times daily to affected eye (s) x 7 days. 3.5 g 0  ? ?No facility-administered  medications prior to visit.  ? ? ?PAST MEDICAL HISTORY: ?Past Medical History:  ?Diagnosis Date  ? Anemia   ? Anxiety   ? Bariatric surgery status   ? Constipation   ? COVID-19   ? Dysrhythmia   ? Elevated liver enzymes   ? GERD (gastroesophageal reflux disease)   ? Hemorrhoids   ? Herpes genitalis   ? High cholesterol   ? Hyperlipidemia   ? Hypertension   ? Hypothyroidism   ? IDA (iron deficiency anemia) 02/09/2021  ? Neuropathy   ? Osteoarthritis   ? Sleep apnea   ? ? ?PAST SURGICAL HISTORY: ?Past Surgical History:  ?Procedure Laterality Date  ? ABDOMINAL HYSTERECTOMY    ? total for fibroids no h/o abnormal pap  ? bariatric sleeve  2015  ? BREAST EXCISIONAL BIOPSY Left 1998  ? carpal tunnel repair    ? COLONOSCOPY WITH PROPOFOL N/A 02/11/2016  ? Procedure: COLONOSCOPY WITH PROPOFOL;  Surgeon: Jonathon Bellows, MD;  Location: [facility] ENDOSCOPY;  Service: Endoscopy;  Laterality: N/A;  ? COLONOSCOPY WITH PROPOFOL N/A 10/01/2019  ? Procedure: COLONOSCOPY WITH PROPOFOL;  Surgeon: Jonathon Bellows, MD;  Location: [facility] ENDOSCOPY;  Service: Gastroenterology;  Laterality: N/A;  ? COLONOSCOPY WITH PROPOFOL N/A 11/19/2020  ? Procedure: COLONOSCOPY WITH PROPOFOL;  Surgeon: Jonathon Bellows, MD;  Location: [facility] ENDOSCOPY;  Service: Gastroenterology;  Laterality: N/A;  ? ESOPHAGOGASTRODUODENOSCOPY (EGD) WITH PROPOFOL N/A 12/02/2019  ? Procedure: ESOPHAGOGASTRODUODENOSCOPY (EGD) WITH PROPOFOL;  Surgeon: Jonathon Bellows, MD;  Location: [facility] ENDOSCOPY;  Service: Gastroenterology;  Laterality: N/A;  ? ESOPHAGOGASTRODUODENOSCOPY (EGD) WITH PROPOFOL N/A 11/19/2020  ? Procedure: ESOPHAGOGASTRODUODENOSCOPY (EGD) WITH PROPOFOL;  Surgeon: Jonathon Bellows, MD;  Location: [facility] ENDOSCOPY;  Service: Gastroenterology;  Laterality: N/A;  ? FLEXIBLE SIGMOIDOSCOPY N/A 02/06/2021  ? Procedure: FLEXIBLE SIGMOIDOSCOPY;  Surgeon: [name], DO;  Location: [facility] ENDOSCOPY;  Service: Gastroenterology;  Laterality: N/A;  ? HEMORRHOID SURGERY    ? LAPAROSCOPIC GASTRIC  RESTRICTIVE DUODENAL PROCEDURE (DUODENAL SWITCH) Bilateral   ? 2020  ? ? ?FAMILY HISTORY: ?Family History  ?Problem Relation Age of Onset  ? Breast cancer Sister 21  ?     materal 1/2 sister  ? Hypertension Sister   ? Hypertension Mother   ? Heart disease Father   ? Hypertension Brother   ? ? ?SOCIAL HISTORY: ?Social History  ? ?Socioeconomic History  ? Marital status: Married  ?  Spouse name: Not on file  ? Number of children: Not on file  ? Years of education: Not on file  ? Highest education level: Not on file  ?Occupational History  ? Not on file  ?Tobacco Use  ? Smoking status: Former  ?  Packs/day: 1.50  ?  Years: 24.00  ?  Pack years: 36.00  ?  Types: Cigarettes  ?  Quit date: 61  ?  Years since quitting: 30.3  ?  Smokeless tobacco: Never  ? Tobacco comments:  ?  quit 1995.   ?Vaping Use  ? Vaping Use: Never used  ?Substance and Sexual Activity  ? Alcohol use: Not Currently  ? Drug use: No  ? Sexual activity: Not Currently  ?  Birth control/protection: Surgical  ?  Comment: Hysterectomy  ?Other Topics Concern  ? Not on file  ?Social History Narrative  ? Lives in [place].   ? Married.   ? Retired 2015, Interior and spatial designer.   ? One son; granddaughter.   ? Left handed   ? Caffeine- decaf coffee.   ? ?Social Determinants of Health  ? ?Financial Resource Strain: Low Risk   ? Difficulty of Paying Living Expenses: Not hard at all  ?Food Insecurity: No Food Insecurity  ? Worried About Charity fundraiser in the Last Year: Never true  ? Ran Out of Food in the Last Year: Never true  ?Transportation Needs: No Transportation Needs  ? Lack of Transportation (Medical): No  ? Lack of Transportation (Non-Medical): No  ?Physical Activity: Not on file  ?Stress: No Stress Concern Present  ? Feeling of Stress : Not at all  ?Social Connections: Unknown  ? Frequency of Communication with Friends and Family: More than three times a week  ? Frequency of Social Gatherings with Friends and Family: More than three times a week  ?  Attends Religious Services: Not on file  ? Active Member of Clubs or Organizations: Not on file  ? Attends Archivist Meetings: Not on file  ? Marital Status: Not on file  ?Intimate Partner Violence: Not At Risk  ?

## 2021-05-04 NOTE — Telephone Encounter (Signed)
Medicare/[employer] auth: [employer] via [employer] website order sent to [facility]. They will reach out to the patient to schedule.  

## 2021-05-04 NOTE — Patient Instructions (Addendum)
?-   repeat MRI brain / pituitary to ensure stability ? ?- may reduce gabapentin to '300mg'$  at bedtime or wean off (not clear if helping; not much pain; may aggravate peripheral edema) ?

## 2021-05-06 ENCOUNTER — Ambulatory Visit: Payer: Medicare Other | Admitting: Podiatry

## 2021-05-08 ENCOUNTER — Other Ambulatory Visit: Payer: Self-pay | Admitting: Family

## 2021-05-08 DIAGNOSIS — F32A Depression, unspecified: Secondary | ICD-10-CM

## 2021-05-08 DIAGNOSIS — F419 Anxiety disorder, unspecified: Secondary | ICD-10-CM

## 2021-05-10 ENCOUNTER — Other Ambulatory Visit: Payer: Self-pay

## 2021-05-10 ENCOUNTER — Ambulatory Visit (INDEPENDENT_AMBULATORY_CARE_PROVIDER_SITE_OTHER): Payer: Medicare Other | Admitting: Vascular Surgery

## 2021-05-10 ENCOUNTER — Ambulatory Visit: Payer: Medicare Other | Admitting: Gastroenterology

## 2021-05-11 ENCOUNTER — Inpatient Hospital Stay: Payer: Medicare Other | Attending: Oncology

## 2021-05-11 DIAGNOSIS — D500 Iron deficiency anemia secondary to blood loss (chronic): Secondary | ICD-10-CM | POA: Diagnosis not present

## 2021-05-11 DIAGNOSIS — R7989 Other specified abnormal findings of blood chemistry: Secondary | ICD-10-CM

## 2021-05-11 DIAGNOSIS — K649 Unspecified hemorrhoids: Secondary | ICD-10-CM | POA: Insufficient documentation

## 2021-05-11 DIAGNOSIS — Z9884 Bariatric surgery status: Secondary | ICD-10-CM | POA: Insufficient documentation

## 2021-05-11 DIAGNOSIS — K921 Melena: Secondary | ICD-10-CM | POA: Diagnosis not present

## 2021-05-11 DIAGNOSIS — K625 Hemorrhage of anus and rectum: Secondary | ICD-10-CM

## 2021-05-11 LAB — CBC WITH DIFFERENTIAL/PLATELET
Abs Immature Granulocytes: 0.02 10*3/uL (ref 0.00–0.07)
Basophils Absolute: 0.1 10*3/uL (ref 0.0–0.1)
Basophils Relative: 1 %
Eosinophils Absolute: 0 10*3/uL (ref 0.0–0.5)
Eosinophils Relative: 0 %
HCT: 33.1 % — ABNORMAL LOW (ref 36.0–46.0)
Hemoglobin: 10.1 g/dL — ABNORMAL LOW (ref 12.0–15.0)
Immature Granulocytes: 0 %
Lymphocytes Relative: 34 %
Lymphs Abs: 2.5 10*3/uL (ref 0.7–4.0)
MCH: 23.4 pg — ABNORMAL LOW (ref 26.0–34.0)
MCHC: 30.5 g/dL (ref 30.0–36.0)
MCV: 76.6 fL — ABNORMAL LOW (ref 80.0–100.0)
Monocytes Absolute: 0.6 10*3/uL (ref 0.1–1.0)
Monocytes Relative: 8 %
Neutro Abs: 4.2 10*3/uL (ref 1.7–7.7)
Neutrophils Relative %: 57 %
Platelets: 207 10*3/uL (ref 150–400)
RBC: 4.32 MIL/uL (ref 3.87–5.11)
RDW: 23.5 % — ABNORMAL HIGH (ref 11.5–15.5)
WBC: 7.4 10*3/uL (ref 4.0–10.5)
nRBC: 0.3 % — ABNORMAL HIGH (ref 0.0–0.2)

## 2021-05-11 LAB — IRON AND TIBC
Iron: 141 ug/dL (ref 28–170)
Saturation Ratios: 29 % (ref 10.4–31.8)
TIBC: 487 ug/dL — ABNORMAL HIGH (ref 250–450)
UIBC: 346 ug/dL

## 2021-05-11 LAB — FERRITIN: Ferritin: 43 ng/mL (ref 11–307)

## 2021-05-11 LAB — RETIC PANEL
Immature Retic Fract: 44 % — ABNORMAL HIGH (ref 2.3–15.9)
RBC.: 4.27 MIL/uL (ref 3.87–5.11)
Retic Count, Absolute: 80.3 10*3/uL (ref 19.0–186.0)
Retic Ct Pct: 1.9 % (ref 0.4–3.1)
Reticulocyte Hemoglobin: 24.3 pg — ABNORMAL LOW (ref >27.9)

## 2021-05-12 ENCOUNTER — Encounter: Payer: Self-pay | Admitting: Primary Care

## 2021-05-12 ENCOUNTER — Ambulatory Visit (INDEPENDENT_AMBULATORY_CARE_PROVIDER_SITE_OTHER): Payer: Medicare Other | Admitting: Primary Care

## 2021-05-12 ENCOUNTER — Telehealth: Payer: Self-pay

## 2021-05-12 VITALS — BP 138/78 | HR 77 | Temp 97.6°F | Ht 61.0 in | Wt 180.6 lb

## 2021-05-12 DIAGNOSIS — G4733 Obstructive sleep apnea (adult) (pediatric): Secondary | ICD-10-CM | POA: Diagnosis not present

## 2021-05-12 DIAGNOSIS — J309 Allergic rhinitis, unspecified: Secondary | ICD-10-CM

## 2021-05-12 NOTE — Progress Notes (Signed)
? ?'@Patient'$  ID: Jennifer [name], female    DOB: September 14, 1950, 71 y.o.   MRN: [id number] ? ?Chief Complaint  ?Patient presents with  ? Follow-up  ?  Wearing cpap nightly-- pressure is okay. Would like to try a different mask.   ? ? ?Referring provider: ?[name], FNP ? ?HPI: ?71 year old female, former smoker quit 1993 (36-pack-year history).  Past medical history significant for OSA, allergic rhinitis, hypertension, GERD, diabetes mellitus, hypothyroidism, chronic pain syndrome.  ? ?05/12/2021 ?Patient presents today ffor over OSA follow-up. She was last seen in 201 by pulmonary NP.  She has history of obstructive sleep apnea. Sleep study in January 2018 showed mild OSA, AHI 14/1/hr (REM supine sleep 55.5). She is currently on CPAP at 10cm h20. She is wearing her CPAP every night but only getting 2 hours of use. She reports experiencing condensation in her nasal mask with use. Humidity level is currently at 4. She feels she is also not getting enough pressure. No residual daytime sleepiness.  ? ?[facility] download 04/10/2021 - 05/09/2021 ?Usage 30/30 days (100%); 3 days (10%) greater than 4 hours ?Average usage 2 hours 7 minutes ?Pressure 10 cm H2O ?Air leaks 9.9 L/min (95%) ?AHI 0.4 ? ? ?Allergies  ?Allergen Reactions  ? Celecoxib Hives  ? Hydrocodone Nausea Only  ? Pollen Extract   ? Sodium Ferric Gluconate [Ferrous Gluconate]   ?  IV ferric gluconate - shortly after the infusion developed back pain shooting into left arm and bilateral hand swelling  ? Nasacort [Triamcinolone] Other (See Comments)  ?  Nasal - Nose Bleeds  ? Vicodin [Hydrocodone-Acetaminophen] Nausea Only  ? ? ?Immunization History  ?Administered Date(s) Administered  ? Fluad Quad(high Dose 65+) 10/02/2020  ? Hep A / Hep B 05/24/2013, 11/21/2013  ? Influenza Split 10/21/2013  ? Influenza, High Dose Seasonal PF 10/24/2016, 09/06/2018  ? Influenza,inj,Quad PF,6+ Mos 11/08/2013, 10/28/2014  ? Influenza-Unspecified 12/05/2011, 10/28/2015,  10/24/2016, 09/06/2017, 10/18/2019  ? PFIZER(Purple Top)SARS-COV-2 Vaccination 03/15/2019, 04/09/2019, 04/09/2019, 11/01/2019, 08/07/2020  ? Pneumococcal Conjugate-13 12/05/2014, 04/06/2016  ? Pneumococcal Polysaccharide-23 06/05/2017  ? Tdap 02/05/2014, 08/07/2018  ? Zoster Recombinat (Shingrix) 12/25/2018  ? Zoster, Live 01/09/2012, 12/05/2014  ? ? ?Past Medical History:  ?Diagnosis Date  ? Anemia   ? Anxiety   ? Bariatric surgery status   ? Constipation   ? COVID-19   ? Dysrhythmia   ? Elevated liver enzymes   ? GERD (gastroesophageal reflux disease)   ? Hemorrhoids   ? Herpes genitalis   ? High cholesterol   ? Hyperlipidemia   ? Hypertension   ? Hypothyroidism   ? IDA (iron deficiency anemia) 02/09/2021  ? Neuropathy   ? Osteoarthritis   ? Sleep apnea   ? ? ?Tobacco History: ?Social History  ? ?Tobacco Use  ?Smoking Status Former  ? Packs/day: 1.50  ? Years: 24.00  ? Pack years: 36.00  ? Types: Cigarettes  ? Quit date: 71  ? Years since quitting: 30.3  ?Smokeless Tobacco Never  ?Tobacco Comments  ? quit 1995.   ? ?Counseling given: Not Answered ?Tobacco comments: quit 1995.  ? ? ?Outpatient Medications Prior to Visit  ?Medication Sig Dispense Refill  ? acetaminophen (TYLENOL) 325 MG tablet Take 2 tablets (650 mg total) by mouth every 6 (six) hours as needed for mild pain (or Fever >/= 101).    ? acyclovir (ZOVIRAX) 400 MG tablet TAKE 1 TABLET BY MOUTH EVERY DAY 30 tablet 5  ? Ascorbic Acid (VITAMIN C PO) Take by mouth.    ?  calcium citrate (CALCITRATE - DOSED IN MG ELEMENTAL CALCIUM) 950 (200 Ca) MG tablet Take 200 mg of elemental calcium by mouth daily.    ? famotidine (PEPCID) 20 MG tablet TAKE 1 TABLET BY MOUTH EVERY NIGHT AT BEDTIME 90 tablet 1  ? famotidine (PEPCID) 40 MG tablet Take 40 mg by mouth daily.    ? fluticasone (FLONASE) 50 MCG/ACT nasal spray Place 2 sprays into both nostrils daily. 16 g 0  ? gabapentin (NEURONTIN) 300 MG capsule Take 1 capsule (300 mg total) by mouth 3 (three) times daily. 180  capsule 2  ? hydrocortisone (ANUSOL-HC) 25 MG suppository Place 1 suppository (25 mg total) rectally 2 (two) times daily. 12 suppository 1  ? ketoconazole (NIZORAL) 2 % cream Apply 1 application topically daily. Apply on bottom of foot and thin layer between toes 60 g 2  ? levothyroxine (SYNTHROID) 50 MCG tablet TAKE 1 TABLET BY MOUTH EVERY DAY 90 tablet 1  ? losartan (COZAAR) 100 MG tablet TAKE 1 TABLET BY MOUTH EVERY DAY AT BEDTIME 30 tablet 2  ? Multiple Vitamin (MULTIVITAMIN WITH MINERALS) TABS tablet Take 1 tablet by mouth daily.    ? sertraline (ZOLOFT) 50 MG tablet Take 1 tablet by mouth at bedtime.    ? sodium chloride (OCEAN) 0.65 % SOLN nasal spray Place 2 sprays into both nostrils as needed for congestion. 30 mL 2  ? triamterene-hydrochlorothiazide (DYAZIDE) 37.5-25 MG capsule Take 1 each (1 capsule total) by mouth daily. 90 capsule 1  ? Multiple Vitamins-Minerals (ZINC PO) Take by mouth.    ? ?No facility-administered medications prior to visit.  ? ?Review of Systems ? ?Review of Systems  ?Constitutional: Negative.   ?Respiratory: Negative.    ? ? ?Physical Exam ? ?BP 138/78 (BP Location: Left Arm, Cuff Size: Large)   Pulse 77   Temp 97.6 ?F (36.4 ?C) (Temporal)   Ht '5\' 1"'$  (1.549 m)   Wt 180 lb 9.6 oz (81.9 kg)   SpO2 100%   BMI 34.12 kg/m?  ?Physical Exam ?Constitutional:   ?   General: She is not in acute distress. ?   Appearance: Normal appearance. She is not ill-appearing.  ?HENT:  ?   Head: Normocephalic and atraumatic.  ?   Mouth/Throat:  ?   Mouth: Mucous membranes are moist.  ?   Pharynx: Oropharynx is clear.  ?Cardiovascular:  ?   Rate and Rhythm: Normal rate and regular rhythm.  ?Pulmonary:  ?   Effort: Pulmonary effort is normal.  ?   Breath sounds: Normal breath sounds. No wheezing, rhonchi or rales.  ?Musculoskeletal:     ?   General: Normal range of motion.  ?Skin: ?   General: Skin is warm and dry.  ?Neurological:  ?   General: No focal deficit present.  ?   Mental Status: She is  alert and oriented to person, place, and time. Mental status is at baseline.  ?Psychiatric:     ?   Mood and Affect: Mood normal.     ?   Behavior: Behavior normal.     ?   Thought Content: Thought content normal.     ?   Judgment: Judgment normal.  ?  ? ?Lab Results: ? ?CBC ?   ?Component Value Date/Time  ? WBC 7.4 05/11/2021 1026  ? RBC 4.27 05/11/2021 1026  ? RBC 4.32 05/11/2021 1026  ? HGB 10.1 (L) 05/11/2021 1026  ? HGB 10.3 (L) 12/21/2020 1356  ? HCT 33.1 (L) 05/11/2021 1026  ?  HCT 32.8 (L) 12/21/2020 1356  ? PLT 207 05/11/2021 1026  ? PLT 236 12/21/2020 1356  ? MCV 76.6 (L) 05/11/2021 1026  ? MCV 76 (L) 12/21/2020 1356  ? MCV 84 06/26/2013 0409  ? MCH 23.4 (L) 05/11/2021 1026  ? MCHC 30.5 05/11/2021 1026  ? RDW 23.5 (H) 05/11/2021 1026  ? RDW 19.6 (H) 12/21/2020 1356  ? RDW 14.5 06/26/2013 0409  ? LYMPHSABS 2.5 05/11/2021 1026  ? LYMPHSABS 1.9 10/14/2020 1038  ? LYMPHSABS 1.5 06/26/2013 0409  ? MONOABS 0.6 05/11/2021 1026  ? MONOABS 0.7 06/26/2013 0409  ? EOSABS 0.0 05/11/2021 1026  ? EOSABS 0.0 10/14/2020 1038  ? EOSABS 0.0 06/26/2013 0409  ? BASOSABS 0.1 05/11/2021 1026  ? BASOSABS 0.1 10/14/2020 1038  ? BASOSABS 0.0 06/26/2013 0409  ? ? ?BMET ?   ?Component Value Date/Time  ? NA 137 03/19/2021 1115  ? NA 138 10/14/2020 1038  ? NA 139 06/26/2013 0409  ? K 5.0 03/19/2021 1115  ? K 4.3 06/26/2013 0409  ? CL 100 03/19/2021 1115  ? CL 106 06/26/2013 0409  ? CO2 28 03/19/2021 1115  ? CO2 28 06/26/2013 0409  ? GLUCOSE 99 03/19/2021 1115  ? GLUCOSE 108 (H) 06/26/2013 0409  ? BUN 9 03/19/2021 1115  ? BUN 11 10/14/2020 1038  ? BUN 12 06/26/2013 0409  ? CREATININE 0.74 03/19/2021 1115  ? CREATININE 0.72 06/26/2013 0409  ? CALCIUM 9.3 03/19/2021 1115  ? CALCIUM 8.5 06/26/2013 0409  ? GFRNONAA >60 02/06/2021 0416  ? GFRNONAA >60 06/26/2013 0409  ? GFRAA >60 05/24/2019 1420  ? GFRAA >60 06/26/2013 0409  ? ? ?BNP ?No results found for: BNP ? ?ProBNP ?   ?Component Value Date/Time  ? PROBNP 10.0 05/07/2018 1351   ? ? ?Imaging: ?No results found. ? ? ?Assessment & Plan:  ? ?OSA (obstructive sleep apnea) ?- 02/17/2016 Sleep study>> AHI 14.1 (REM supine 55.5). Patient is 100% compliant with CPAP use but average usage is only 2 hours 7 min.

## 2021-05-12 NOTE — Assessment & Plan Note (Signed)
-   02/17/2016 Sleep study>> AHI 14.1 (REM supine 55.5). Patient is 100% compliant with CPAP use but average usage is only 2 hours 7 min. She is having some difficulty with condensation in nasal mask and pressure settings. Humidify level is 4. Current pressure 10cm h20, residual AHI 0.4. She feels pressure is not strong enough. We adjusted humidification to level 1 and plan to increase pressure to 11cm h20. Encourage she aim to wear CPAP every night for minimum 4 hours or longer. FU in 3 months or sooner.  ?

## 2021-05-12 NOTE — Progress Notes (Signed)
Reviewed and agree with assessment/plan. ? ? ?Chesley Mires, MD ?[place] ?05/12/2021, 3:57 PM ?Pager:  [phone] ? ?

## 2021-05-12 NOTE — Telephone Encounter (Signed)
Are you okay with my sending patient's prescription for Allegra back in? She does not want to take the Singulair & asked if we could refill Allegra. Pt aware that it is OTC but prefers a script.  ?

## 2021-05-12 NOTE — Patient Instructions (Addendum)
Recommendations: ?Aim to get 4 hours of CPAP use every night ?I turned down your humidification to level 1 (you can shut this off or turn it up to 2-3 if needed) ?Increasing CPAP pressure to 11cm h20  ? ?Follow-up: ?3 months with either Dr. Mortimer Fries or Sood (15 min slot ok) ? ?CPAP and BIPAP Information ?CPAP and BIPAP are methods that use air pressure to keep your airways open and to help you breathe well. CPAP and BIPAP use different amounts of pressure. Your health care provider will tell you whether CPAP or BIPAP would be more helpful for you. ?CPAP stands for "continuous positive airway pressure." With CPAP, the amount of pressure stays the same while you breathe in (inhale) and out (exhale). ?BIPAP stands for "bi-level positive airway pressure." With BIPAP, the amount of pressure will be higher when you inhale and lower when you exhale. This allows you to take larger breaths. ?CPAP or BIPAP may be used in the hospital, or your health care provider may want you to use it at home. You may need to have a sleep study before your health care provider can order a machine for you to use at home. ?What are the advantages? ?CPAP or BIPAP can be helpful if you have: ?Sleep apnea. ?Chronic obstructive pulmonary disease (COPD). ?Heart failure. ?Medical conditions that cause muscle weakness, including muscular dystrophy or amyotrophic lateral sclerosis (ALS). ?Other problems that cause breathing to be shallow, weak, abnormal, or difficult. ?CPAP and BIPAP are most commonly used for obstructive sleep apnea (OSA) to keep the airways from collapsing when the muscles relax during sleep. ?What are the risks? ?Generally, this is a safe treatment. However, problems may occur, including: ?Irritated skin or skin sores if the mask does not fit properly. ?Dry or stuffy nose or nosebleeds. ?Dry mouth. ?Feeling gassy or bloated. ?Sinus or lung infection if the equipment is not cleaned properly. ?When should CPAP or BIPAP be used? ?In most  cases, the mask only needs to be worn during sleep. Generally, the mask needs to be worn throughout the night and during any daytime naps. People with certain medical conditions may also need to wear the mask at other times, such as when they are awake. Follow instructions from your health care provider about when to use the machine. ?What happens during CPAP or BIPAP? ? ?Both CPAP and BIPAP are provided by a small machine with a flexible plastic tube that attaches to a plastic mask that you wear. Air is blown through the mask into your nose or mouth. The amount of pressure that is used to blow the air can be adjusted on the machine. Your health care provider will set the pressure setting and help you find the best mask for you. ?Tips for using the mask ?Because the mask needs to be snug, some people feel trapped or closed-in (claustrophobic) when first using the mask. If you feel this way, you may need to get used to the mask. One way to do this is to hold the mask loosely over your nose or mouth and then gradually apply the mask more snugly. You can also gradually increase the amount of time that you use the mask. ?Masks are available in various types and sizes. If your mask does not fit well, talk with your health care provider about getting a different one. Some common types of masks include: ?Full face masks, which fit over the mouth and nose. ?Nasal masks, which fit over the nose. ?Nasal pillow or prong  masks, which fit into the nostrils. ?If you are using a mask that fits over your nose and you tend to breathe through your mouth, a chin strap may be applied to help keep your mouth closed. ?Use a skin barrier to protect your skin as told by your health care provider. ?Some CPAP and BIPAP machines have alarms that may sound if the mask comes off or develops a leak. ?If you have trouble with the mask, it is very important that you talk with your health care provider about finding a way to make the mask easier to  tolerate. Do not stop using the mask. There could be a negative impact on your health if you stop using the mask. ?Tips for using the machine ?Place your CPAP or BIPAP machine on a secure table or stand near an electrical outlet. ?Know where the on/off switch is on the machine. ?Follow instructions from your health care provider about how to set the pressure on your machine and when you should use it. ?Do not eat or drink while the CPAP or BIPAP machine is on. Food or fluids could get pushed into your lungs by the pressure of the CPAP or BIPAP. ?For home use, CPAP and BIPAP machines can be rented or purchased through home health care companies. Many different brands of machines are available. Renting a machine before purchasing may help you find out which particular machine works well for you. Your health insurance company may also decide which machine you may get. ?Keep the CPAP or BIPAP machine and attachments clean. Ask your health care provider for specific instructions. ?Check the humidifier if you have a dry stuffy nose or nosebleeds. Make sure it is working correctly. ?Follow these instructions at home: ?Take over-the-counter and prescription medicines only as told by your health care provider. Ask if you can take sinus medicine if your sinuses are blocked. ?Do not use any products that contain nicotine or tobacco. These products include cigarettes, chewing tobacco, and vaping devices, such as e-cigarettes. If you need help quitting, ask your health care provider. ?Keep all follow-up visits. This is important. ?Contact a health care provider if: ?You have redness or pressure sores on your head, face, mouth, or nose from the mask or head gear. ?You have trouble using the CPAP or BIPAP machine. ?You cannot tolerate wearing the CPAP or BIPAP mask. ?Someone tells you that you snore even when wearing your CPAP or BIPAP. ?Get help right away if: ?You have trouble breathing. ?You feel confused. ?Summary ?CPAP and  BIPAP are methods that use air pressure to keep your airways open and to help you breathe well. ?If you have trouble with the mask, it is very important that you talk with your health care provider about finding a way to make the mask easier to tolerate. Do not stop using the mask. There could be a negative impact to your health if you stop using the mask. ?Follow instructions from your health care provider about when to use the machine. ?This information is not intended to replace advice given to you by your health care provider. Make sure you discuss any questions you have with your health care provider. ?Document Revised: 08/12/2020 Document Reviewed: 12/13/2019 ?Elsevier Patient Education ? [place]. ? ?

## 2021-05-13 ENCOUNTER — Inpatient Hospital Stay (HOSPITAL_BASED_OUTPATIENT_CLINIC_OR_DEPARTMENT_OTHER): Payer: Medicare Other | Admitting: Oncology

## 2021-05-13 ENCOUNTER — Inpatient Hospital Stay: Payer: Medicare Other

## 2021-05-13 ENCOUNTER — Encounter: Payer: Self-pay | Admitting: Oncology

## 2021-05-13 VITALS — BP 128/75 | HR 72 | Temp 98.0°F | Resp 18 | Wt 183.1 lb

## 2021-05-13 DIAGNOSIS — K921 Melena: Secondary | ICD-10-CM | POA: Diagnosis not present

## 2021-05-13 DIAGNOSIS — D62 Acute posthemorrhagic anemia: Secondary | ICD-10-CM

## 2021-05-13 DIAGNOSIS — D500 Iron deficiency anemia secondary to blood loss (chronic): Secondary | ICD-10-CM

## 2021-05-13 DIAGNOSIS — K649 Unspecified hemorrhoids: Secondary | ICD-10-CM | POA: Diagnosis not present

## 2021-05-13 DIAGNOSIS — K625 Hemorrhage of anus and rectum: Secondary | ICD-10-CM | POA: Diagnosis not present

## 2021-05-13 DIAGNOSIS — Z9884 Bariatric surgery status: Secondary | ICD-10-CM | POA: Diagnosis not present

## 2021-05-13 MED ORDER — IRON SUCROSE 20 MG/ML IV SOLN
200.0000 mg | Freq: Once | INTRAVENOUS | Status: AC
  Administered 2021-05-13: 200 mg via INTRAVENOUS
  Filled 2021-05-13: qty 10

## 2021-05-13 MED ORDER — SODIUM CHLORIDE 0.9 % IV SOLN: 200.0000 mg | Freq: Once | INTRAVENOUS | Status: DC

## 2021-05-13 MED ORDER — SODIUM CHLORIDE 0.9 % IV SOLN
Freq: Once | INTRAVENOUS | Status: AC
  Filled 2021-05-13: qty 250

## 2021-05-13 NOTE — Addendum Note (Signed)
Addended by: Claudette Head A on: 05/13/2021 11:43 AM ? ? Modules accepted: Orders ? ?

## 2021-05-13 NOTE — Progress Notes (Signed)
Patient here for follow up. Pt states that she would like to see if she can take iron pills instead of iron infusions.  ?

## 2021-05-13 NOTE — Progress Notes (Signed)
?Hematology/Oncology Consult note ?[phone] [phone] ?   ? ? ?Patient Care Team: ?[name], FNP as PCP - General (Family Medicine) ?Kennith Center, RD as Dietitian (Family Medicine) ?Earlie Server, MD as Consulting Physician (Hematology and Oncology) ? ?REFERRING PROVIDER: ?[name], FNP  ?CHIEF COMPLAINTS/REASON FOR VISIT:  ?Follow-up for iron deficiency anemia. ? ?HISTORY OF PRESENTING ILLNESS:  ? ?Jennifer [name] is a  71 y.o.  female with PMH listed below was seen in consultation at the request of  [name], FNP  for evaluation of iron deficiency anemia ? ?Patient was admitted from 02/05/2021 - 02/06/2021 due to symptomatic anemia with bright red blood per rectum.  Initial hemoglobin at presentation was 5.9.  Patient was transfused with 2 units of PRBC.  Also received 1 dose of IV Venofer treatments.  Patient had a flexible sigmoidoscopy by Dr. Virgina Jock.  Findings of nonbleeding hemorrhoids.  Patient follows up with Dr. [name] outpatient.  Patient was previously taking aspirin which has been discontinued. ? ?Patient had hospitalization from 11/17/2020 - 11/19/2020 due to symptomatic anemia secondary to intermittent BPBPR/melena.  She received blood transfusion during that hospitalization.  She also had a reaction to ferrous gluconate. She had EGD and colonoscopy on 11/19/2020 with no notable findings of active bleeding. ? ? ?Patient has a history of bariatric surgery. ? ?INTERVAL HISTORY ?Jennifer [name] is a 71 y.o. female who has above history reviewed by me today presents for follow up visit for iron deficiency ?Patient is status post IV Venofer treatments.   ?Fatigue is slightly better.  She has no new complaints.  She continues to have small amount of blood in the stool due to hemorrhoids. ? ? ?Review of Systems  ?Constitutional:  Positive for fatigue. Negative for appetite change, chills and fever.  ?HENT:   Negative for hearing loss and voice change.   ?Eyes:   Negative for eye problems.  ?Respiratory:  Negative for chest tightness and cough.   ?Cardiovascular:  Negative for chest pain.  ?Gastrointestinal:  Positive for blood in stool. Negative for abdominal distention and abdominal pain.  ?Endocrine: Negative for hot flashes.  ?Genitourinary:  Negative for difficulty urinating and frequency.   ?Musculoskeletal:  Negative for arthralgias.  ?Skin:  Negative for itching and rash.  ?Neurological:  Negative for extremity weakness.  ?Hematological:  Negative for adenopathy.  ?Psychiatric/Behavioral:  Negative for confusion.   ? ?MEDICAL HISTORY:  ?Past Medical History:  ?Diagnosis Date  ? Anemia   ? Anxiety   ? Bariatric surgery status   ? Constipation   ? COVID-19   ? Dysrhythmia   ? Elevated liver enzymes   ? GERD (gastroesophageal reflux disease)   ? Hemorrhoids   ? Herpes genitalis   ? High cholesterol   ? Hyperlipidemia   ? Hypertension   ? Hypothyroidism   ? IDA (iron deficiency anemia) 02/09/2021  ? Neuropathy   ? Osteoarthritis   ? Sleep apnea   ? ? ?SURGICAL HISTORY: ?Past Surgical History:  ?Procedure Laterality Date  ? ABDOMINAL HYSTERECTOMY    ? total for fibroids no h/o abnormal pap  ? bariatric sleeve  2015  ? BREAST EXCISIONAL BIOPSY Left 1998  ? carpal tunnel repair    ? COLONOSCOPY WITH PROPOFOL N/A 02/11/2016  ? Procedure: COLONOSCOPY WITH PROPOFOL;  Surgeon: Jonathon Bellows, MD;  Location: [facility] ENDOSCOPY;  Service: Endoscopy;  Laterality: N/A;  ? COLONOSCOPY WITH PROPOFOL N/A 10/01/2019  ? Procedure: COLONOSCOPY WITH PROPOFOL;  Surgeon: Jonathon Bellows, MD;  Location: [facility] ENDOSCOPY;  Service: Gastroenterology;  Laterality: N/A;  ? COLONOSCOPY WITH PROPOFOL N/A 11/19/2020  ? Procedure: COLONOSCOPY WITH PROPOFOL;  Surgeon: Jonathon Bellows, MD;  Location: [facility] ENDOSCOPY;  Service: Gastroenterology;  Laterality: N/A;  ? ESOPHAGOGASTRODUODENOSCOPY (EGD) WITH PROPOFOL N/A 12/02/2019  ? Procedure: ESOPHAGOGASTRODUODENOSCOPY (EGD) WITH PROPOFOL;  Surgeon: Jonathon Bellows, MD;   Location: [facility] ENDOSCOPY;  Service: Gastroenterology;  Laterality: N/A;  ? ESOPHAGOGASTRODUODENOSCOPY (EGD) WITH PROPOFOL N/A 11/19/2020  ? Procedure: ESOPHAGOGASTRODUODENOSCOPY (EGD) WITH PROPOFOL;  Surgeon: Jonathon Bellows, MD;  Location: [facility] ENDOSCOPY;  Service: Gastroenterology;  Laterality: N/A;  ? FLEXIBLE SIGMOIDOSCOPY N/A 02/06/2021  ? Procedure: FLEXIBLE SIGMOIDOSCOPY;  Surgeon: [name], DO;  Location: [facility] ENDOSCOPY;  Service: Gastroenterology;  Laterality: N/A;  ? HEMORRHOID SURGERY    ? LAPAROSCOPIC GASTRIC RESTRICTIVE DUODENAL PROCEDURE (DUODENAL SWITCH) Bilateral   ? 2020  ? ? ?SOCIAL HISTORY: ?Social History  ? ?Socioeconomic History  ? Marital status: Married  ?  Spouse name: Not on file  ? Number of children: Not on file  ? Years of education: Not on file  ? Highest education level: Not on file  ?Occupational History  ? Not on file  ?Tobacco Use  ? Smoking status: Former  ?  Packs/day: 1.50  ?  Years: 24.00  ?  Pack years: 36.00  ?  Types: Cigarettes  ?  Quit date: 38  ?  Years since quitting: 30.3  ? Smokeless tobacco: Never  ? Tobacco comments:  ?  quit 1995.   ?Vaping Use  ? Vaping Use: Never used  ?Substance and Sexual Activity  ? Alcohol use: Not Currently  ? Drug use: No  ? Sexual activity: Not Currently  ?  Birth control/protection: Surgical  ?  Comment: Hysterectomy  ?Other Topics Concern  ? Not on file  ?Social History Narrative  ? Lives in [place].   ? Married.   ? Retired 2015, Interior and spatial designer.   ? One son; granddaughter.   ? Left handed   ? Caffeine- decaf coffee.   ? ?Social Determinants of Health  ? ?Financial Resource Strain: Low Risk   ? Difficulty of Paying Living Expenses: Not hard at all  ?Food Insecurity: No Food Insecurity  ? Worried About Charity fundraiser in the Last Year: Never true  ? Ran Out of Food in the Last Year: Never true  ?Transportation Needs: No Transportation Needs  ? Lack of Transportation (Medical): No  ? Lack of Transportation  (Non-Medical): No  ?Physical Activity: Not on file  ?Stress: No Stress Concern Present  ? Feeling of Stress : Not at all  ?Social Connections: Unknown  ? Frequency of Communication with Friends and Family: More than three times a week  ? Frequency of Social Gatherings with Friends and Family: More than three times a week  ? Attends Religious Services: Not on file  ? Active Member of Clubs or Organizations: Not on file  ? Attends Archivist Meetings: Not on file  ? Marital Status: Not on file  ?Intimate Partner Violence: Not At Risk  ? Fear of Current or Ex-Partner: No  ? Emotionally Abused: No  ? Physically Abused: No  ? Sexually Abused: No  ? ? ?FAMILY HISTORY: ?Family History  ?Problem Relation Age of Onset  ? Breast cancer Sister 67  ?     materal 1/2 sister  ? Hypertension Sister   ? Hypertension Mother   ? Heart disease Father   ? Hypertension Brother   ? ? ?  ALLERGIES:  is allergic to celecoxib, hydrocodone, pollen extract, sodium ferric gluconate [ferrous gluconate], nasacort [triamcinolone], and vicodin [hydrocodone-acetaminophen]. ? ?MEDICATIONS:  ?Current Outpatient Medications  ?Medication Sig Dispense Refill  ? acetaminophen (TYLENOL) 325 MG tablet Take 2 tablets (650 mg total) by mouth every 6 (six) hours as needed for mild pain (or Fever >/= 101).    ? acyclovir (ZOVIRAX) 400 MG tablet TAKE 1 TABLET BY MOUTH EVERY DAY 30 tablet 5  ? Ascorbic Acid (VITAMIN C PO) Take by mouth.    ? calcium citrate (CALCITRATE - DOSED IN MG ELEMENTAL CALCIUM) 950 (200 Ca) MG tablet Take 200 mg of elemental calcium by mouth daily.    ? famotidine (PEPCID) 20 MG tablet TAKE 1 TABLET BY MOUTH EVERY NIGHT AT BEDTIME 90 tablet 1  ? fluticasone (FLONASE) 50 MCG/ACT nasal spray Place 2 sprays into both nostrils daily. 16 g 0  ? gabapentin (NEURONTIN) 300 MG capsule Take 1 capsule (300 mg total) by mouth 3 (three) times daily. 180 capsule 2  ? hydrocortisone (ANUSOL-HC) 25 MG suppository Place 1 suppository (25 mg  total) rectally 2 (two) times daily. 12 suppository 1  ? ketoconazole (NIZORAL) 2 % cream Apply 1 application topically daily. Apply on bottom of foot and thin layer between toes 60 g 2  ? levothyroxine (SYNTHROID) 50 MCG

## 2021-05-13 NOTE — Patient Instructions (Signed)

## 2021-05-14 ENCOUNTER — Ambulatory Visit
Admission: RE | Admit: 2021-05-14 | Discharge: 2021-05-14 | Disposition: A | Discharge status: Home / Self Care | Payer: Medicare Other | Source: Ambulatory Visit | Attending: Diagnostic Neuroimaging | Admitting: Diagnostic Neuroimaging

## 2021-05-14 DIAGNOSIS — E237 Disorder of pituitary gland, unspecified: Secondary | ICD-10-CM

## 2021-05-14 DIAGNOSIS — M5416 Radiculopathy, lumbar region: Secondary | ICD-10-CM | POA: Diagnosis not present

## 2021-05-14 DIAGNOSIS — D352 Benign neoplasm of pituitary gland: Secondary | ICD-10-CM | POA: Diagnosis not present

## 2021-05-14 DIAGNOSIS — M5136 Other intervertebral disc degeneration, lumbar region: Secondary | ICD-10-CM | POA: Diagnosis not present

## 2021-05-14 DIAGNOSIS — M5412 Radiculopathy, cervical region: Secondary | ICD-10-CM | POA: Diagnosis not present

## 2021-05-14 DIAGNOSIS — I619 Nontraumatic intracerebral hemorrhage, unspecified: Secondary | ICD-10-CM | POA: Diagnosis not present

## 2021-05-14 DIAGNOSIS — M542 Cervicalgia: Secondary | ICD-10-CM | POA: Diagnosis not present

## 2021-05-14 DIAGNOSIS — M5030 Other cervical disc degeneration, unspecified cervical region: Secondary | ICD-10-CM | POA: Diagnosis not present

## 2021-05-14 MED ORDER — FEXOFENADINE HCL 180 MG PO TABS: 180.0000 mg | ORAL_TABLET | Freq: Every day | ORAL | 1 refills | Status: DC

## 2021-05-14 MED ORDER — GADOBENATE DIMEGLUMINE 529 MG/ML IV SOLN
9.0000 mL | Freq: Once | INTRAVENOUS | Status: AC | PRN
  Administered 2021-05-14: 9 mL via INTRAVENOUS

## 2021-05-14 NOTE — Telephone Encounter (Signed)
Sent in [place] ?

## 2021-05-14 NOTE — Addendum Note (Signed)
Addended by: [name] on: 05/14/2021 01:22 PM ? ? Modules accepted: Orders ? ?

## 2021-05-18 ENCOUNTER — Ambulatory Visit (INDEPENDENT_AMBULATORY_CARE_PROVIDER_SITE_OTHER): Payer: Medicare Other | Admitting: Podiatry

## 2021-05-18 DIAGNOSIS — E1142 Type 2 diabetes mellitus with diabetic polyneuropathy: Secondary | ICD-10-CM

## 2021-05-18 DIAGNOSIS — S90822A Blister (nonthermal), left foot, initial encounter: Secondary | ICD-10-CM

## 2021-05-18 NOTE — Progress Notes (Signed)
?Subjective:  ?Patient ID: Jennifer [name], female    DOB: [birth year],  MRN: [id number] ? ?Chief Complaint  ?Patient presents with  ? Diabetes  ?  Blister follow up  ?Pt stated that it is healing slowly   ? ? ?71 y.o. female presents with the above complaint.  Patient presents with follow-up on left heel blister ration.  She states she is doing a lot better.  She is been doing Betadine wet-to-dry dressing change and keeping it covered. ? ? ?Review of Systems: Negative except as noted in the HPI. Denies N/V/F/Ch. ? ?Past Medical History:  ?Diagnosis Date  ? Anemia   ? Anxiety   ? Bariatric surgery status   ? Constipation   ? COVID-19   ? Dysrhythmia   ? Elevated liver enzymes   ? GERD (gastroesophageal reflux disease)   ? Hemorrhoids   ? Herpes genitalis   ? High cholesterol   ? Hyperlipidemia   ? Hypertension   ? Hypothyroidism   ? IDA (iron deficiency anemia) 02/09/2021  ? Neuropathy   ? Osteoarthritis   ? Sleep apnea   ? ? ?Current Outpatient Medications:  ?  acetaminophen (TYLENOL) 325 MG tablet, Take 2 tablets (650 mg total) by mouth every 6 (six) hours as needed for mild pain (or Fever >/= 101)., Disp: , Rfl:  ?  acyclovir (ZOVIRAX) 400 MG tablet, TAKE 1 TABLET BY MOUTH EVERY DAY, Disp: 30 tablet, Rfl: 5 ?  Ascorbic Acid (VITAMIN C PO), Take by mouth., Disp: , Rfl:  ?  calcium citrate (CALCITRATE - DOSED IN MG ELEMENTAL CALCIUM) 950 (200 Ca) MG tablet, Take 200 mg of elemental calcium by mouth daily., Disp: , Rfl:  ?  famotidine (PEPCID) 20 MG tablet, TAKE 1 TABLET BY MOUTH EVERY NIGHT AT BEDTIME, Disp: 90 tablet, Rfl: 1 ?  fexofenadine (ALLEGRA ALLERGY) 180 MG tablet, Take 1 tablet (180 mg total) by mouth daily., Disp: 90 tablet, Rfl: 1 ?  fluticasone (FLONASE) 50 MCG/ACT nasal spray, Place 2 sprays into both nostrils daily., Disp: 16 g, Rfl: 0 ?  gabapentin (NEURONTIN) 300 MG capsule, Take 1 capsule (300 mg total) by mouth 3 (three) times daily., Disp: 180 capsule, Rfl: 2 ?  hydrocortisone (ANUSOL-HC) 25 MG  suppository, Place 1 suppository (25 mg total) rectally 2 (two) times daily., Disp: 12 suppository, Rfl: 1 ?  ketoconazole (NIZORAL) 2 % cream, Apply 1 application topically daily. Apply on bottom of foot and thin layer between toes, Disp: 60 g, Rfl: 2 ?  levothyroxine (SYNTHROID) 50 MCG tablet, TAKE 1 TABLET BY MOUTH EVERY DAY, Disp: 90 tablet, Rfl: 1 ?  losartan (COZAAR) 100 MG tablet, TAKE 1 TABLET BY MOUTH EVERY DAY AT BEDTIME, Disp: 30 tablet, Rfl: 2 ?  Multiple Vitamin (MULTIVITAMIN WITH MINERALS) TABS tablet, Take 1 tablet by mouth daily., Disp: , Rfl:  ?  sertraline (ZOLOFT) 50 MG tablet, Take 1 tablet by mouth at bedtime., Disp: , Rfl:  ?  sodium chloride (OCEAN) 0.65 % SOLN nasal spray, Place 2 sprays into both nostrils as needed for congestion., Disp: 30 mL, Rfl: 2 ?  triamterene-hydrochlorothiazide (DYAZIDE) 37.5-25 MG capsule, Take 1 each (1 capsule total) by mouth daily., Disp: 90 capsule, Rfl: 1 ? ?Social History  ? ?Tobacco Use  ?Smoking Status Former  ? Packs/day: 1.50  ? Years: 24.00  ? Pack years: 36.00  ? Types: Cigarettes  ? Quit date: 69  ? Years since quitting: 30.3  ?Smokeless Tobacco Never  ?Tobacco Comments  ? quit  1995.   ? ? ?Allergies  ?Allergen Reactions  ? Celecoxib Hives  ? Hydrocodone Nausea Only  ? Pollen Extract   ? Sodium Ferric Gluconate [Ferrous Gluconate]   ?  IV ferric gluconate - shortly after the infusion developed back pain shooting into left arm and bilateral hand swelling  ? Nasacort [Triamcinolone] Other (See Comments)  ?  Nasal - Nose Bleeds  ? Vicodin [Hydrocodone-Acetaminophen] Nausea Only  ? ?Objective:  ?There were no vitals filed for this visit. ?There is no height or weight on file to calculate BMI. ?Constitutional Well developed. ?Well nourished.  ?Vascular Dorsalis pedis pulses palpable bilaterally. ?Posterior tibial pulses palpable bilaterally. ?Capillary refill normal to all digits.  ?No cyanosis or clubbing noted. ?Pedal hair growth normal.  ?Neurologic  Normal speech. ?Oriented to person, place, and time. ?Epicritic sensation to light touch grossly present bilaterally.  ?Dermatologic No further blister formation noted.  Skin has completely reepithelialized.  No signs of ulceration noted.  ?Orthopedic: Normal joint ROM without pain or crepitus bilaterally. ?No visible deformities. ?No bony tenderness.  ? ?Radiographs: None ?Assessment:  ? ?1. Diabetic polyneuropathy associated with type 2 diabetes mellitus ([place])   ?2. Blister of left heel, initial encounter   ? ? ?Plan:  ?Patient was evaluated and treated and all questions answered. ? ?Left heel blister with underlying history of diabetes ?-All questions and concerns were discussed with the patient in extensive detail. ?-Clinically healed and no further blister formation noted.  The skin has completely reepithelialized.  At this time I discussed shoe gear modification as well as ways to avoid blistering.  She states understanding and will stop doing scraping and blistering. ?No follow-ups on file. ?

## 2021-05-19 DIAGNOSIS — R051 Acute cough: Secondary | ICD-10-CM | POA: Diagnosis not present

## 2021-05-19 DIAGNOSIS — R059 Cough, unspecified: Secondary | ICD-10-CM | POA: Diagnosis not present

## 2021-05-19 DIAGNOSIS — M25511 Pain in right shoulder: Secondary | ICD-10-CM | POA: Diagnosis not present

## 2021-05-19 DIAGNOSIS — M7581 Other shoulder lesions, right shoulder: Secondary | ICD-10-CM | POA: Diagnosis not present

## 2021-05-19 DIAGNOSIS — G8929 Other chronic pain: Secondary | ICD-10-CM | POA: Diagnosis not present

## 2021-05-19 DIAGNOSIS — M7582 Other shoulder lesions, left shoulder: Secondary | ICD-10-CM | POA: Diagnosis not present

## 2021-05-19 DIAGNOSIS — M25512 Pain in left shoulder: Secondary | ICD-10-CM | POA: Diagnosis not present

## 2021-05-19 DIAGNOSIS — Z20822 Contact with and (suspected) exposure to covid-19: Secondary | ICD-10-CM | POA: Diagnosis not present

## 2021-05-20 ENCOUNTER — Other Ambulatory Visit: Payer: Self-pay | Admitting: Family

## 2021-05-20 DIAGNOSIS — I10 Essential (primary) hypertension: Secondary | ICD-10-CM

## 2021-05-24 ENCOUNTER — Inpatient Hospital Stay: Payer: Medicare Other | Attending: Oncology

## 2021-05-26 ENCOUNTER — Telehealth: Payer: Self-pay | Admitting: *Deleted

## 2021-05-26 NOTE — Telephone Encounter (Signed)
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 It is the same as when she MRI Sept 2022 ordered by Dr Pryor Ochoa ENT . It is likely incidental finding. Patient verbalized understanding, appreciation. ? ?

## 2021-05-31 ENCOUNTER — Other Ambulatory Visit: Payer: Self-pay | Admitting: Family

## 2021-05-31 DIAGNOSIS — Z1231 Encounter for screening mammogram for malignant neoplasm of breast: Secondary | ICD-10-CM

## 2021-06-06 ENCOUNTER — Other Ambulatory Visit: Payer: Self-pay

## 2021-06-06 ENCOUNTER — Emergency Department: Payer: Medicare Other

## 2021-06-06 ENCOUNTER — Inpatient Hospital Stay
Admission: EM | Admit: 2021-06-06 | Discharge: 2021-06-10 | DRG: 378 | Disposition: A | Discharge status: Home / Self Care | Payer: Medicare Other | Attending: Student in an Organized Health Care Education/Training Program | Admitting: Student in an Organized Health Care Education/Training Program

## 2021-06-06 DIAGNOSIS — K219 Gastro-esophageal reflux disease without esophagitis: Secondary | ICD-10-CM | POA: Diagnosis present

## 2021-06-06 DIAGNOSIS — R52 Pain, unspecified: Secondary | ICD-10-CM | POA: Diagnosis not present

## 2021-06-06 DIAGNOSIS — D62 Acute posthemorrhagic anemia: Secondary | ICD-10-CM | POA: Diagnosis present

## 2021-06-06 DIAGNOSIS — D509 Iron deficiency anemia, unspecified: Secondary | ICD-10-CM | POA: Diagnosis present

## 2021-06-06 DIAGNOSIS — F32A Depression, unspecified: Secondary | ICD-10-CM | POA: Diagnosis not present

## 2021-06-06 DIAGNOSIS — G4733 Obstructive sleep apnea (adult) (pediatric): Secondary | ICD-10-CM | POA: Diagnosis present

## 2021-06-06 DIAGNOSIS — R42 Dizziness and giddiness: Secondary | ICD-10-CM | POA: Diagnosis not present

## 2021-06-06 DIAGNOSIS — F419 Anxiety disorder, unspecified: Secondary | ICD-10-CM | POA: Diagnosis not present

## 2021-06-06 DIAGNOSIS — R5381 Other malaise: Secondary | ICD-10-CM | POA: Diagnosis not present

## 2021-06-06 DIAGNOSIS — E785 Hyperlipidemia, unspecified: Secondary | ICD-10-CM | POA: Diagnosis present

## 2021-06-06 DIAGNOSIS — D649 Anemia, unspecified: Secondary | ICD-10-CM | POA: Diagnosis not present

## 2021-06-06 DIAGNOSIS — Z6834 Body mass index (BMI) 34.0-34.9, adult: Secondary | ICD-10-CM | POA: Diagnosis not present

## 2021-06-06 DIAGNOSIS — R9431 Abnormal electrocardiogram [ECG] [EKG]: Secondary | ICD-10-CM | POA: Diagnosis not present

## 2021-06-06 DIAGNOSIS — R112 Nausea with vomiting, unspecified: Secondary | ICD-10-CM | POA: Diagnosis present

## 2021-06-06 DIAGNOSIS — Z9884 Bariatric surgery status: Secondary | ICD-10-CM | POA: Diagnosis not present

## 2021-06-06 DIAGNOSIS — Z79890 Hormone replacement therapy (postmenopausal): Secondary | ICD-10-CM

## 2021-06-06 DIAGNOSIS — Z886 Allergy status to analgesic agent status: Secondary | ICD-10-CM | POA: Diagnosis not present

## 2021-06-06 DIAGNOSIS — Z79899 Other long term (current) drug therapy: Secondary | ICD-10-CM

## 2021-06-06 DIAGNOSIS — E039 Hypothyroidism, unspecified: Secondary | ICD-10-CM | POA: Diagnosis present

## 2021-06-06 DIAGNOSIS — K921 Melena: Secondary | ICD-10-CM | POA: Diagnosis not present

## 2021-06-06 DIAGNOSIS — I10 Essential (primary) hypertension: Secondary | ICD-10-CM | POA: Diagnosis not present

## 2021-06-06 DIAGNOSIS — Z8249 Family history of ischemic heart disease and other diseases of the circulatory system: Secondary | ICD-10-CM

## 2021-06-06 DIAGNOSIS — Z803 Family history of malignant neoplasm of breast: Secondary | ICD-10-CM

## 2021-06-06 DIAGNOSIS — I959 Hypotension, unspecified: Secondary | ICD-10-CM | POA: Diagnosis not present

## 2021-06-06 DIAGNOSIS — Z8616 Personal history of COVID-19: Secondary | ICD-10-CM

## 2021-06-06 DIAGNOSIS — Z903 Acquired absence of stomach [part of]: Secondary | ICD-10-CM

## 2021-06-06 DIAGNOSIS — Z87891 Personal history of nicotine dependence: Secondary | ICD-10-CM | POA: Diagnosis not present

## 2021-06-06 DIAGNOSIS — G629 Polyneuropathy, unspecified: Secondary | ICD-10-CM | POA: Diagnosis present

## 2021-06-06 DIAGNOSIS — K922 Gastrointestinal hemorrhage, unspecified: Secondary | ICD-10-CM | POA: Diagnosis not present

## 2021-06-06 DIAGNOSIS — F1010 Alcohol abuse, uncomplicated: Secondary | ICD-10-CM | POA: Diagnosis present

## 2021-06-06 DIAGNOSIS — E669 Obesity, unspecified: Secondary | ICD-10-CM | POA: Diagnosis present

## 2021-06-06 DIAGNOSIS — E782 Mixed hyperlipidemia: Secondary | ICD-10-CM | POA: Diagnosis not present

## 2021-06-06 DIAGNOSIS — E038 Other specified hypothyroidism: Secondary | ICD-10-CM | POA: Diagnosis not present

## 2021-06-06 DIAGNOSIS — E7800 Pure hypercholesterolemia, unspecified: Secondary | ICD-10-CM | POA: Diagnosis present

## 2021-06-06 DIAGNOSIS — D696 Thrombocytopenia, unspecified: Secondary | ICD-10-CM | POA: Diagnosis present

## 2021-06-06 DIAGNOSIS — R55 Syncope and collapse: Secondary | ICD-10-CM | POA: Diagnosis not present

## 2021-06-06 LAB — CBC
HCT: 17.1 % — ABNORMAL LOW (ref 36.0–46.0)
Hemoglobin: 5 g/dL — ABNORMAL LOW (ref 12.0–15.0)
MCH: 23.6 pg — ABNORMAL LOW (ref 26.0–34.0)
MCHC: 29.2 g/dL — ABNORMAL LOW (ref 30.0–36.0)
MCV: 80.7 fL (ref 80.0–100.0)
Platelets: 193 10*3/uL (ref 150–400)
RBC: 2.12 MIL/uL — ABNORMAL LOW (ref 3.87–5.11)
RDW: 23.4 % — ABNORMAL HIGH (ref 11.5–15.5)
WBC: 9.1 10*3/uL (ref 4.0–10.5)
nRBC: 0.2 % (ref 0.0–0.2)

## 2021-06-06 LAB — COMPREHENSIVE METABOLIC PANEL
ALT: 39 U/L (ref 0–44)
AST: 75 U/L — ABNORMAL HIGH (ref 15–41)
Albumin: 3.8 g/dL (ref 3.5–5.0)
Alkaline Phosphatase: 59 U/L (ref 38–126)
Anion gap: 8 (ref 5–15)
BUN: 12 mg/dL (ref 8–23)
CO2: 25 mmol/L (ref 22–32)
Calcium: 8.3 mg/dL — ABNORMAL LOW (ref 8.9–10.3)
Chloride: 93 mmol/L — ABNORMAL LOW (ref 98–111)
Creatinine, Ser: 0.68 mg/dL (ref 0.44–1.00)
GFR, Estimated: >60 mL/min (ref >60)
Glucose, Bld: 131 mg/dL — ABNORMAL HIGH (ref 70–99)
Potassium: 3.8 mmol/L (ref 3.5–5.1)
Sodium: 126 mmol/L — ABNORMAL LOW (ref 135–145)
Total Bilirubin: 0.6 mg/dL (ref 0.3–1.2)
Total Protein: 6.2 g/dL — ABNORMAL LOW (ref 6.5–8.1)

## 2021-06-06 LAB — PREPARE RBC (CROSSMATCH)

## 2021-06-06 LAB — PROTIME-INR
INR: 1.1 (ref 0.8–1.2)
Prothrombin Time: 14.3 seconds (ref 11.4–15.2)

## 2021-06-06 MED ORDER — TRIAMTERENE-HCTZ 37.5-25 MG PO TABS
1.0000 | ORAL_TABLET | Freq: Every day | ORAL | Status: DC
  Administered 2021-06-07 – 2021-06-08 (×2): 1 via ORAL
  Filled 2021-06-06 (×3): qty 1

## 2021-06-06 MED ORDER — PANTOPRAZOLE INFUSION (NEW) - SIMPLE MED
8.0000 mg/h | INTRAVENOUS | Status: AC
  Administered 2021-06-06 – 2021-06-09 (×7): 8 mg/h via INTRAVENOUS
  Filled 2021-06-06 (×7): qty 100

## 2021-06-06 MED ORDER — ONDANSETRON HCL 4 MG/2ML IJ SOLN: 4.0000 mg | Freq: Four times a day (QID) | INTRAMUSCULAR | Status: DC | PRN

## 2021-06-06 MED ORDER — FOLIC ACID 1 MG PO TABS
1.0000 mg | ORAL_TABLET | Freq: Every day | ORAL | Status: DC
  Administered 2021-06-06 – 2021-06-10 (×5): 1 mg via ORAL
  Filled 2021-06-06 (×5): qty 1

## 2021-06-06 MED ORDER — ONDANSETRON HCL 4 MG PO TABS: 4.0000 mg | ORAL_TABLET | Freq: Four times a day (QID) | ORAL | Status: DC | PRN

## 2021-06-06 MED ORDER — GABAPENTIN 300 MG PO CAPS: 300.0000 mg | ORAL_CAPSULE | Freq: Three times a day (TID) | ORAL | Status: DC

## 2021-06-06 MED ORDER — ONDANSETRON HCL 4 MG/2ML IJ SOLN
4.0000 mg | Freq: Once | INTRAMUSCULAR | Status: AC
Start: 2021-06-06 — End: 2021-06-06
  Administered 2021-06-06: 4 mg via INTRAVENOUS
  Filled 2021-06-06: qty 2

## 2021-06-06 MED ORDER — LEVOTHYROXINE SODIUM 50 MCG PO TABS
50.0000 ug | ORAL_TABLET | Freq: Every day | ORAL | Status: DC
  Administered 2021-06-07 – 2021-06-10 (×4): 50 ug via ORAL
  Filled 2021-06-06 (×4): qty 1

## 2021-06-06 MED ORDER — SERTRALINE HCL 50 MG PO TABS
50.0000 mg | ORAL_TABLET | Freq: Once | ORAL | Status: AC
  Administered 2021-06-06: 50 mg via ORAL
  Filled 2021-06-06: qty 1

## 2021-06-06 MED ORDER — PANTOPRAZOLE SODIUM 40 MG IV SOLR
40.0000 mg | Freq: Once | INTRAVENOUS | Status: AC
Start: 2021-06-06 — End: 2021-06-06
  Administered 2021-06-06: 40 mg via INTRAVENOUS
  Filled 2021-06-06: qty 10

## 2021-06-06 MED ORDER — SODIUM CHLORIDE 0.9 % IV SOLN
10.0000 mL/h | Freq: Once | INTRAVENOUS | Status: AC
  Administered 2021-06-06: 10 mL/h via INTRAVENOUS

## 2021-06-06 MED ORDER — FAMOTIDINE 20 MG PO TABS
20.0000 mg | ORAL_TABLET | Freq: Every day | ORAL | Status: DC
  Administered 2021-06-06 – 2021-06-09 (×4): 20 mg via ORAL
  Filled 2021-06-06 (×4): qty 1

## 2021-06-06 MED ORDER — ACETAMINOPHEN 325 MG PO TABS
650.0000 mg | ORAL_TABLET | Freq: Four times a day (QID) | ORAL | Status: AC | PRN
  Administered 2021-06-06 – 2021-06-08 (×4): 650 mg via ORAL
  Filled 2021-06-06 (×4): qty 2

## 2021-06-06 MED ORDER — PANTOPRAZOLE SODIUM 40 MG IV SOLR
40.0000 mg | Freq: Two times a day (BID) | INTRAVENOUS | Status: DC
  Administered 2021-06-10: 40 mg via INTRAVENOUS
  Filled 2021-06-06: qty 10

## 2021-06-06 MED ORDER — LOSARTAN POTASSIUM 50 MG PO TABS
100.0000 mg | ORAL_TABLET | Freq: Every day | ORAL | Status: DC
  Administered 2021-06-07 – 2021-06-10 (×4): 100 mg via ORAL
  Filled 2021-06-06 (×4): qty 2

## 2021-06-06 MED ORDER — PANTOPRAZOLE SODIUM 40 MG IV SOLR: 40.0000 mg | Freq: Once | INTRAVENOUS | Status: DC

## 2021-06-06 MED ORDER — SERTRALINE HCL 50 MG PO TABS
50.0000 mg | ORAL_TABLET | Freq: Every day | ORAL | Status: DC
  Administered 2021-06-07 – 2021-06-09 (×3): 50 mg via ORAL
  Filled 2021-06-06 (×3): qty 1

## 2021-06-06 MED ORDER — LORAZEPAM 2 MG/ML IJ SOLN: 1.0000 mg | INTRAMUSCULAR | Status: DC | PRN

## 2021-06-06 MED ORDER — ACYCLOVIR 400 MG PO TABS
400.0000 mg | ORAL_TABLET | Freq: Every day | ORAL | Status: DC
  Filled 2021-06-06 (×2): qty 1

## 2021-06-06 MED ORDER — ADULT MULTIVITAMIN W/MINERALS CH
1.0000 | ORAL_TABLET | Freq: Every day | ORAL | Status: DC
  Administered 2021-06-06 – 2021-06-10 (×4): 1 via ORAL
  Filled 2021-06-06 (×4): qty 1

## 2021-06-06 MED ORDER — FENTANYL CITRATE PF 50 MCG/ML IJ SOSY
25.0000 ug | PREFILLED_SYRINGE | Freq: Once | INTRAMUSCULAR | Status: AC
Start: 2021-06-06 — End: 2021-06-06
  Administered 2021-06-06: 25 ug via INTRAVENOUS
  Filled 2021-06-06: qty 1

## 2021-06-06 MED ORDER — FLUTICASONE PROPIONATE 50 MCG/ACT NA SUSP: 2.0000 | Freq: Every day | NASAL | Status: DC | PRN

## 2021-06-06 MED ORDER — GABAPENTIN 600 MG PO TABS
300.0000 mg | ORAL_TABLET | Freq: Once | ORAL | Status: AC
  Administered 2021-06-06: 300 mg via ORAL
  Filled 2021-06-06: qty 0.5

## 2021-06-06 MED ORDER — ACETAMINOPHEN 650 MG RE SUPP: 650.0000 mg | Freq: Four times a day (QID) | RECTAL | Status: AC | PRN

## 2021-06-06 MED ORDER — LORAZEPAM 1 MG PO TABS: 1.0000 mg | ORAL_TABLET | ORAL | Status: DC | PRN

## 2021-06-06 MED ORDER — GABAPENTIN 300 MG PO CAPS
300.0000 mg | ORAL_CAPSULE | Freq: Three times a day (TID) | ORAL | Status: DC
  Administered 2021-06-07 – 2021-06-10 (×11): 300 mg via ORAL
  Filled 2021-06-06 (×11): qty 1

## 2021-06-06 MED ORDER — THIAMINE HCL 100 MG PO TABS
100.0000 mg | ORAL_TABLET | Freq: Every day | ORAL | Status: DC
  Administered 2021-06-08 – 2021-06-10 (×3): 100 mg via ORAL
  Filled 2021-06-06 (×3): qty 1

## 2021-06-06 MED ORDER — IOHEXOL 350 MG/ML SOLN
100.0000 mL | Freq: Once | INTRAVENOUS | Status: AC | PRN
  Administered 2021-06-06: 100 mL via INTRAVENOUS

## 2021-06-06 MED ORDER — THIAMINE HCL 100 MG/ML IJ SOLN
100.0000 mg | Freq: Every day | INTRAMUSCULAR | Status: DC
  Administered 2021-06-06 – 2021-06-07 (×2): 100 mg via INTRAVENOUS
  Filled 2021-06-06 (×2): qty 2

## 2021-06-06 NOTE — ED Provider Notes (Signed)
[facility] Provider Note    Event Date/Time   First MD Initiated Contact with Patient 06/06/21 1821     (approximate)   History   GI Bleeding   HPI  Jennifer [name] is a 71 y.o. female with history of anemia and GI bleeding previously who comes in with bleeding patient's had intermittent abdominal pain nausea vomiting for the past 2 days.  She is a history of gastric bypass and sleeves.  She reports some lower abdominal pain with bright blood blood per rectum.  She denies any history of cirrhosis but does report a history of some elevated liver test and does report drinking alcohol daily with 1 to 2 glasses of wine.  Pt was in the waiting room and was feeling dizzy and then when he was pushing her up she started seizing whole body shaking, with some foaming and unresponsive, and then came back too.  She was diaphoretic.  Upon my assessment soon after this event she is completely alert and oriented.  Patient reports that she felt lightheaded prior to this event  I reviewed the records were patient was admitted in January 2023 for symptomatic anemia with a hemoglobin of 5.9.  Patient had a flexible sigmoidoscopy that showed nonbleeding hemorrhoids.  They discussed doing a outpatient capsule endoscopy if continued having bleeding.  She had an endoscopy on 11/2020 that did not show any varices   Physical Exam   Triage Vital Signs: ED Triage Vitals  Enc Vitals Group     BP 06/06/21 1733 (!) 149/72     Pulse Rate 06/06/21 1733 88     Resp 06/06/21 1733 18     Temp 06/06/21 1736 98.2 F (36.8 C)     Temp Source 06/06/21 1736 Oral     SpO2 06/06/21 1733 100 %     Weight 06/06/21 1734 177 lb (80.3 kg)     Height 06/06/21 1734 '5\' 1"'$  (1.549 m)     Head Circumference --      Peak Flow --      Pain Score 06/06/21 1734 4     Pain Loc --      Pain Edu? --      Excl. in [place]? --     Most recent vital signs: Vitals:   06/06/21 1733 06/06/21 1736  BP: (!)  149/72   Pulse: 88   Resp: 18   Temp:  98.2 F (36.8 C)  SpO2: 100%      General: Awake, no distress.  CV:  Good peripheral perfusion.  Resp:  Normal effort.  Abd:  No distention.  Slightly tender in the lower abdomen Other:  Rectal exam with gross blood   ED Results / Procedures / Treatments   Labs (all labs ordered are listed, but only abnormal results are displayed) Labs Reviewed  COMPREHENSIVE METABOLIC PANEL - Abnormal; Notable for the following components:      Result Value   Sodium 126 (*)    Chloride 93 (*)    Glucose, Bld 131 (*)    Calcium 8.3 (*)    Total Protein 6.2 (*)    AST 75 (*)    All other components within normal limits  CBC - Abnormal; Notable for the following components:   RBC 2.12 (*)    Hemoglobin 5.0 (*)    HCT 17.1 (*)    MCH 23.6 (*)    MCHC 29.2 (*)    RDW 23.4 (*)    All other components  within normal limits  PROTIME-INR  TYPE AND SCREEN     EKG  My interpretation of EKG:  Normal sinus rate of 78 without any ST elevation or T wave inversions.  RADIOLOGY I have reviewed the CT personally and interpreted there is no intracranial hemorrhage.    IMPRESSION: VASCULAR   No contrast extravasation seen to localize GI bleed.   NON-VASCULAR   Prior gastric bypass.   No acute findings in the abdomen or pelvis.      PROCEDURES:  Critical Care performed: Yes, see critical care procedure note(s)  .Critical Care Performed by: Vanessa [place], MD Authorized by: Vanessa [place], MD   Critical care provider statement:    Critical care time (minutes):  30   Critical care was necessary to treat or prevent imminent or life-threatening deterioration of the following conditions: anemia.   Critical care was time spent personally by me on the following activities:  Development of treatment plan with patient or surrogate, discussions with consultants, evaluation of patient's response to treatment, examination of patient, ordering and review  of laboratory studies, ordering and review of radiographic studies, ordering and performing treatments and interventions, pulse oximetry, re-evaluation of patient's condition and review of old charts .1-3 Lead EKG Interpretation Performed by: Vanessa Flora, MD Authorized by: Vanessa [place], MD     Interpretation: normal     ECG rate:  80   ECG rate assessment: normal     Rhythm: sinus rhythm     Ectopy: none     Conduction: normal     MEDICATIONS ORDERED IN ED: Medications  0.9 %  sodium chloride infusion (has no administration in time range)  ondansetron (ZOFRAN) injection 4 mg (has no administration in time range)  fentaNYL (SUBLIMAZE) injection 25 mcg (has no administration in time range)  pantoprazole (PROTONIX) injection 40 mg (has no administration in time range)  iohexol (OMNIPAQUE) 350 MG/ML injection 100 mL (has no administration in time range)     IMPRESSION / MDM / ASSESSMENT AND PLAN / ED COURSE  I reviewed the triage vital signs and the nursing notes.   Patient comes in with concern for bright red blood per rectum.  Will get CT angio to evaluate for active bleeding given dose of Protonix.  Given the concern for possible seizure we will get CT head I suspect it was more likely syncopal given I do not feel like she is significantly postictal soon after having the event.  Labs show significantly low hemoglobin at 5.0.  We will start off with 2 units.  CMP shows low sodium and low chloride. INR is normal.  Review patient's CT scan she is got no evidence of cirrhosis and she does report drinking 2 drinks daily but she had prior endoscopy in November without any evidence of varices so seems less likely to be variceal bleed.  Suspect this is more likely lower GI bleed.  Will discuss with the hospital team for admission   The patient is on the cardiac monitor to evaluate for evidence of arrhythmia and/or significant heart rate changes.      FINAL CLINICAL IMPRESSION(S) /  ED DIAGNOSES   Final diagnoses:  Gastrointestinal hemorrhage, unspecified gastrointestinal hemorrhage type  Symptomatic anemia     Rx / DC Orders   ED Discharge Orders     None        Note:  This document was prepared using Dragon voice recognition software and may include unintentional dictation errors.   Jari Pigg,  Royetta Crochet, MD 06/06/21 2033

## 2021-06-06 NOTE — Assessment & Plan Note (Addendum)
-   Presumed secondary to alcohol abuse and likely dependence, however given history of gastric sleeve surgery, gastric ulceration cannot be excluded at this time - Admit to inpatient stepdown - Protonix bolus and gtt. -Repeat hemoglobin hematocrit scheduled for stat post completion of 2 units of PRBC ordered, discussed with nursing staff - GI specialist, Dr. Allen Norris, has been consulted by myself via secure chat - Dr. Allen Norris has acknowledged the consultation and he will see the patient

## 2021-06-06 NOTE — Assessment & Plan Note (Addendum)
-   Baseline hemoglobin is 8.9-10.1 - Hemoglobin on admission is 5.0

## 2021-06-06 NOTE — ED Triage Notes (Signed)
Pt states she has been having dark red blood in her stools and vomiting- pt states it started on Friday- pt states she has also been weaker than normal- pt states she had the same thing happen in Nov last year and had a bleed and had to get 2 transfusions

## 2021-06-06 NOTE — Assessment & Plan Note (Signed)
-   Possible alcohol withdrawal well in the waiting room in the ED - Patient states she has not had EtOH/wine in 1 to 2 days - CIWA precautions

## 2021-06-06 NOTE — Assessment & Plan Note (Signed)
Presumed secondary to upper GI bleed - Baseline hemoglobin is 8.9-10 - Patient has 2 PIV, we will maintain these - EDP ordered 2 units of PRBC

## 2021-06-06 NOTE — Hospital Course (Addendum)
[name] is a 72 year old female with history of alcohol use/abuse, neuropathy, hypothyroid, depression, GERD, anxiety, morbid obesity, who presents emergency department for chief concerns of rectal bleeding and black stool.  Initial vitals in the emergency department showed temperature of 98.2, respiration rate of 18, heart rate of 88, blood pressure 149/72, SPO2 100% on room air.  Serum sodium is 126, potassium 3.8, chloride 93, bicarb 25, BUN of 12, serum creatinine of 0.68, nonfasting glucose 131, GFR greater than 60.  WBC 9.1, hemoglobin 5, platelets of 193.  CT abdomen and pelvis with and without contrast was read as: No contrast extravasation seen to localize GI bleed.  Prior gastric bypass.  No acute findings in the abdomen or pelvis.  Per ED provider, it was reported that patient was in the ED waiting room and had 1 episode of tremulous activity concerning for presyncope.  EDP treatment: 2 units of PRBC, Protonix 40 mg IV one-time dose, ondansetron 4 mg IV.

## 2021-06-06 NOTE — ED Triage Notes (Signed)
First Nurse Note:  Pt via EMS from home. Pt c/o intermittent abd pain, NV for the past 2 days. Pt has a hx of gastric bypass and sleeves. Pt is A&Ox4 and NAD  EMS gave 223m of fluid  20G L AC 173 CBG 143/57 80 HR 97% on RA

## 2021-06-06 NOTE — H&P (Addendum)
History and Physical   Jennifer [name] GQQ:[id number] DOB: 27-Aug-1950 DOA: 06/06/2021  PCP: [name], FNP  Outpatient Specialists: Dr. Tasia Catchings, medical hematology Patient coming from: home via EMS  I have personally briefly reviewed patient's old medical records in [place].  Chief Concern: rectal bleeding  HPI: [name] is a 71 year old female with history of alcohol use/abuse, neuropathy, hypothyroid, depression, GERD, anxiety, morbid obesity, who presents emergency department for chief concerns of rectal bleeding and black stool.  Initial vitals in the emergency department showed temperature of 98.2, respiration rate of 18, heart rate of 88, blood pressure 149/72, SPO2 100% on room air.  Serum sodium is 126, potassium 3.8, chloride 93, bicarb 25, BUN of 12, serum creatinine of 0.68, nonfasting glucose 131, GFR greater than 60.  WBC 9.1, hemoglobin 5, platelets of 193.  CT abdomen and pelvis with and without contrast was read as: No contrast extravasation seen to localize GI bleed.  Prior gastric bypass.  No acute findings in the abdomen or pelvis.  Per ED provider, it was reported that patient was in the ED waiting room and had 1 episode of tremulous activity concerning for presyncope.  EDP treatment: 2 units of PRBC, Protonix 40 mg IV one-time dose, ondansetron 4 mg IV.  At bedside she is able to tell me her name, her age, current calendar year.  She knows she is in the hospital.  She states that on Friday, May 19, she had several episodes of black stool with bloody bowel movements.  She had 1 episode of nausea on day of presentation and states that the vomitus was food with white sputum/saliva.  She denies chest pain, abdominal pain, swelling in her legs, dysuria, diarrhea.  She endorses dyspnea with exertion.  Social history: She lives at home with her husband.  She initially states that she drinks 1-2 white Styrofoam cups of wine per day.  She denies  recreational drug use.   ROS:  Constitutional: no weight change, no fever ENT/Mouth: no sore throat, no rhinorrhea Eyes: no eye pain, no vision changes Cardiovascular: no chest pain, no dyspnea,  no edema, no palpitations Respiratory: no cough, no sputum, no wheezing Gastrointestinal: no nausea, no vomiting, no diarrhea, no constipation Genitourinary: no urinary incontinence, no dysuria, no hematuria Musculoskeletal: no arthralgias, no myalgias Skin: no skin lesions, no pruritus, Neuro: + weakness, no loss of consciousness, no syncope Psych: no anxiety, no depression, + decrease appetite Heme/Lymph: no bruising, no bleeding  ED Course: Discussed with emergency medicine provider, patient requiring hospitalization for chief concerns of symptomatic acute on chronic anemia.  Assessment/Plan  Principal Problem:   Upper GI bleed Active Problems:   Obesity, unspecified   Hyperlipidemia   Essential hypertension   GERD (gastroesophageal reflux disease)   Hypothyroidism   Depression   History of sleeve gastrectomy   Hypercholesterolemia   Morbid obesity (HCC)   Obstructive sleep apnea   Symptomatic anemia   GI bleed   Anxiety and depression   Acute blood loss anemia   IDA (iron deficiency anemia)   Alcohol abuse   Assessment and Plan:  * Upper GI bleed - Presumed secondary to alcohol abuse and likely dependence, however given history of gastric sleeve surgery, gastric ulceration cannot be excluded at this time - Admit to inpatient stepdown - Protonix bolus and gtt. -Repeat hemoglobin hematocrit scheduled for stat post completion of 2 units of PRBC ordered, discussed with nursing staff - GI specialist, Dr. Allen Norris, has been consulted by myself  via secure chat - Dr. Allen Norris has acknowledged the consultation and he will see the patient  Alcohol abuse - Possible alcohol withdrawal well in the waiting room in the ED - Patient states she has not had EtOH/wine in 1 to 2 days - CIWA  precautions  IDA (iron deficiency anemia) - Baseline hemoglobin is 8.9-10.1 - Hemoglobin on admission is 5.0  Acute blood loss anemia Presumed secondary to upper GI bleed - Baseline hemoglobin is 8.9-10 - Patient has 2 PIV, we will maintain these - EDP ordered 2 units of PRBC  Depression - Sertraline 50 mg nightly resumed  Hypothyroidism - Levothyroxine 50 mcg daily resumed  GERD (gastroesophageal reflux disease) - Currently on Protonix gtt.  Essential hypertension - Losartan 100 mg daily, triamterene-hydrochlorothiazide 37.5-25 mg daily resumed for 06/07/2021  - Hydralazine 10 mg p.o. every 6 hours as needed for SBP greater than 175, 4 days ordered  Obesity, unspecified This meets criteria for morbid obesity based on the presence of 1 or more chronic comorbidities (hypertension).  Patient has 33.4 BMI. This complicates overall care and prognosis.    DVT prophylaxis-pharmacologic DVT prophylaxis has not been initiated by myself due to acute on chronic anemia secondary to upper GI bleed - AM team to initiate pharmacologic DVT prophylaxis when its benefits outweigh the risk  Chart reviewed.   DVT prophylaxis: TED hose Code Status: full code Diet: Clear liquids; n.p.o. after midnight Family Communication: No Disposition Plan: Pending GI evaluation Consults called: Gastroenterology Admission status: Stepdown, inpatient  Past Medical History:  Diagnosis Date   Anemia    Anxiety    Bariatric surgery status    Constipation    COVID-19    Dysrhythmia    Elevated liver enzymes    GERD (gastroesophageal reflux disease)    Hemorrhoids    Herpes genitalis    High cholesterol    Hyperlipidemia    Hypertension    Hypothyroidism    IDA (iron deficiency anemia) 02/09/2021   Neuropathy    Osteoarthritis    Sleep apnea    Past Surgical History:  Procedure Laterality Date   ABDOMINAL HYSTERECTOMY     total for fibroids no h/o abnormal pap   bariatric sleeve  2015    BREAST EXCISIONAL BIOPSY Left 1998   carpal tunnel repair     COLONOSCOPY WITH PROPOFOL N/A 02/11/2016   Procedure: COLONOSCOPY WITH PROPOFOL;  Surgeon: Jonathon Bellows, MD;  Location: [facility] ENDOSCOPY;  Service: Endoscopy;  Laterality: N/A;   COLONOSCOPY WITH PROPOFOL N/A 10/01/2019   Procedure: COLONOSCOPY WITH PROPOFOL;  Surgeon: Jonathon Bellows, MD;  Location[name] ENDOSCOPY;  Service: Gastroenterology;  Laterality: N/A;   COLONOSCOPY WITH PROPOFOL N/A 11/19/2020   Procedure: COLONOSCOPY WITH PROPOFOL;  Surgeon: Jonathon Bellows, MD;  Location: [facility] ENDOSCOPY;  Service: Gastroenterology;  Laterality: N/A;   ESOPHAGOGASTRODUODENOSCOPY (EGD) WITH PROPOFOL N/A 12/02/2019   Procedure: ESOPHAGOGASTRODUODENOSCOPY (EGD) WITH PROPOFOL;  Surgeon: Jonathon Bellows, MD;  Location: [facility] ENDOSCOPY;  Service: Gastroenterology;  Laterality: N/A;   ESOPHAGOGASTRODUODENOSCOPY (EGD) WITH PROPOFOL N/A 11/19/2020   Procedure: ESOPHAGOGASTRODUODENOSCOPY (EGD) WITH PROPOFOL;  Surgeon: Jonathon Bellows, MD;  Location: [facility] ENDOSCOPY;  Service: Gastroenterology;  Laterality: N/A;   FLEXIBLE SIGMOIDOSCOPY N/A 02/06/2021   Procedure: FLEXIBLE SIGMOIDOSCOPY;  Surgeon: [name], DO;  Location: [facility] ENDOSCOPY;  Service: Gastroenterology;  Laterality: N/A;   HEMORRHOID SURGERY     LAPAROSCOPIC GASTRIC RESTRICTIVE DUODENAL PROCEDURE (DUODENAL SWITCH) Bilateral    2020   Social History:  reports that she quit smoking about 30 years  ago. Her smoking use included cigarettes. She has a 36.00 pack-year smoking history. She has never used smokeless tobacco. She reports that she does not currently use alcohol. She reports that she does not use drugs.  Allergies  Allergen Reactions   Celecoxib Hives   Hydrocodone Nausea Only   Pollen Extract    Sodium Ferric Gluconate [Ferrous Gluconate]     IV ferric gluconate - shortly after the infusion developed back pain shooting into left arm and bilateral hand swelling   Nasacort [Triamcinolone]  Other (See Comments)    Nasal - Nose Bleeds   Vicodin [Hydrocodone-Acetaminophen] Nausea Only   Family History  Problem Relation Age of Onset   Breast cancer Sister 62       materal 1/2 sister   Hypertension Sister    Hypertension Mother    Heart disease Father    Hypertension Brother    Family history: Family history reviewed and not pertinent.  Prior to Admission medications   Medication Sig Start Date End Date Taking? Authorizing Provider  acetaminophen (TYLENOL) 325 MG tablet Take 2 tablets (650 mg total) by mouth every 6 (six) hours as needed for mild pain (or Fever >/= 101). 06/12/20  Yes Nolberto Hanlon, MD  acyclovir (ZOVIRAX) 400 MG tablet TAKE 1 TABLET BY MOUTH EVERY DAY Patient taking differently: Take 400 mg by mouth daily at 6 (six) AM. 03/15/21  Yes Arnett, Yvetta Coder, FNP  calcium citrate (CALCITRATE - DOSED IN MG ELEMENTAL CALCIUM) 950 (200 Ca) MG tablet Take 200 mg of elemental calcium by mouth daily.   Yes [provider]  famotidine (PEPCID) 20 MG tablet TAKE 1 TABLET BY MOUTH EVERY NIGHT AT BEDTIME Patient taking differently: Take 20 mg by mouth at bedtime. 05/12/21  Yes [name], FNP  fexofenadine [facility] ALLERGY) 180 MG tablet Take 1 tablet (180 mg total) by mouth daily. 05/14/21  Yes Arnett, Yvetta Coder, FNP  fluticasone (FLONASE) 50 MCG/ACT nasal spray Place 2 sprays into both nostrils daily. 02/02/21  Yes Dutch Quint B, FNP  gabapentin (NEURONTIN) 300 MG capsule Take 1 capsule (300 mg total) by mouth 3 (three) times daily. 04/07/21  Yes [name], FNP  levothyroxine (SYNTHROID) 50 MCG tablet TAKE 1 TABLET BY MOUTH EVERY DAY Patient taking differently: Take 50 mcg by mouth daily before breakfast. 03/22/21  Yes Arnett, Yvetta Coder, FNP  losartan (COZAAR) 100 MG tablet TAKE 1 TABLET BY MOUTH EVERY DAY AT BEDTIME Patient taking differently: Take 100 mg by mouth daily. 04/26/21  Yes Arnett, Yvetta Coder, FNP  Multiple Vitamin (MULTIVITAMIN WITH  MINERALS) TABS tablet Take 1 tablet by mouth daily.   Yes [provider]  sertraline (ZOLOFT) 50 MG tablet Take 50 mg by mouth at bedtime. 03/19/21  Yes [provider]  triamterene-hydrochlorothiazide (DYAZIDE) 37.5-25 MG capsule Take 1 each (1 capsule total) by mouth daily. 04/07/21  Yes [name], FNP  Ascorbic Acid (VITAMIN C PO) Take by mouth. Patient not taking: Reported on 06/06/2021    [provider]  hydrocortisone (ANUSOL-HC) 25 MG suppository Place 1 suppository (25 mg total) rectally 2 (two) times daily. 11/11/20   [name], FNP  ketoconazole (NIZORAL) 2 % cream Apply 1 application topically daily. Apply on bottom of foot and thin layer between toes 02/10/21   McDonald, Adam R, DPM  sodium chloride (OCEAN) 0.65 % SOLN nasal spray Place 2 sprays into both nostrils as needed for congestion. 01/21/21   McLean-Scocuzza, Nino Glow, MD  Physical Exam: Vitals:   06/06/21 2300 06/06/21 2315 06/06/21 2330 06/06/21 2345  BP: (!) 119/54 115/63 116/62 128/75  Pulse: 73 74 73 86  Resp: (!) '21 12 17 19  '$ Temp:      TempSrc:      SpO2: 100% 100% 100% 100%  Weight:      Height:       Constitutional: appears age-appropriate, NAD, calm, comfortable Eyes: PERRL, lids and conjunctivae normal ENMT: Mucous membranes are moist. Posterior pharynx clear of any exudate or lesions. Age-appropriate dentition. Hearing appropriate Neck: normal, supple, no masses, no thyromegaly Respiratory: clear to auscultation bilaterally, no wheezing, no crackles. Normal respiratory effort. No accessory muscle use.  Cardiovascular: Regular rate and rhythm, no murmurs / rubs / gallops. No extremity edema. 2+ pedal pulses. No carotid bruits.  Abdomen: no tenderness, no masses palpated, no hepatosplenomegaly. Bowel sounds positive.  Musculoskeletal: no clubbing / cyanosis. No joint deformity upper and lower extremities. Good ROM, no contractures, no atrophy. Normal muscle tone.   Skin: no rashes, lesions, ulcers. No induration Neurologic: Sensation intact. Strength 5/5 in all 4.  Psychiatric: Normal judgment and insight. Alert and oriented x 3. Normal mood.   EKG: independently reviewed, showing sinus rhythm with rate of 78, QTc 467  Chest x-ray on Admission: I personally reviewed and I agree with radiologist reading as below.  CT HEAD WO CONTRAST (5MM)  Result Date: 06/06/2021 CLINICAL DATA:  71 year old female with syncope. EXAM: CT HEAD WITHOUT CONTRAST TECHNIQUE: Contiguous axial images were obtained from the base of the skull through the vertex without intravenous contrast. RADIATION DOSE REDUCTION: This exam was performed according to the departmental dose-optimization program which includes automated exposure control, adjustment of the mA and/or kV according to patient size and/or use of iterative reconstruction technique. COMPARISON:  03/10/2020 CT and 05/14/2021 brain MR FINDINGS: Brain: No evidence of acute infarction, hemorrhage, hydrocephalus, extra-axial collection or mass lesion/mass effect. Mild atrophy again noted. Vascular: Carotid atherosclerotic calcifications are noted. Skull: Normal. Negative for fracture or focal lesion. Sinuses/Orbits: No acute finding. Other: None. IMPRESSION: 1. No evidence of acute intracranial abnormality. 2. Mild atrophy. Electronically Signed   By: Margarette Canada M.D.   On: 06/06/2021 19:24   CT Angio Abd/Pel W and/or Wo Contrast  Result Date: 06/06/2021 CLINICAL DATA:  GI bleed, lower BRB per rectum EXAM: CTA ABDOMEN AND PELVIS WITHOUT AND WITH CONTRAST TECHNIQUE: Multidetector CT imaging of the abdomen and pelvis was performed using the standard protocol during bolus administration of intravenous contrast. Multiplanar reconstructed images and MIPs were obtained and reviewed to evaluate the vascular anatomy. RADIATION DOSE REDUCTION: This exam was performed according to the departmental dose-optimization program which includes  automated exposure control, adjustment of the mA and/or kV according to patient size and/or use of iterative reconstruction technique. CONTRAST:  146m OMNIPAQUE IOHEXOL 350 MG/ML SOLN COMPARISON:  06/09/2020 FINDINGS: VASCULAR Aorta: Aortic atherosclerosis.  No aneurysm or dissection. Celiac: Patent without evidence of aneurysm, dissection, vasculitis or significant stenosis. SMA: Patent without evidence of aneurysm, dissection, vasculitis or significant stenosis. Renals: Both renal arteries are patent without evidence of aneurysm, dissection, vasculitis, fibromuscular dysplasia or significant stenosis. IMA: Patent without evidence of aneurysm, dissection, vasculitis or significant stenosis. Inflow: Atherosclerosis.  No aneurysm or dissection. Proximal Outflow: Atherosclerosis.  No aneurysm or dissection. Veins: No obvious venous abnormality within the limitations of this arterial phase study. Review of the MIP images confirms the above findings. NON-VASCULAR Lower chest: In no acute abnormality Hepatobiliary: No focal liver abnormality is  seen. Status post cholecystectomy. No biliary dilatation. Pancreas: No focal abnormality or ductal dilatation. Spleen: No focal abnormality.  Normal size. Adrenals/Urinary Tract: No adrenal abnormality. No focal renal abnormality. No stones or hydronephrosis. Urinary bladder is unremarkable. Stomach/Bowel: Postoperative changes from prior gastric bypass. No contrast extravasation to localize GI bleed. No bowel obstruction. Lymphatic: No adenopathy Reproductive: Prior hysterectomy.  No adnexal masses. Other: No free fluid or free air. Musculoskeletal: No acute bony abnormality. IMPRESSION: VASCULAR No contrast extravasation seen to localize GI bleed. NON-VASCULAR Prior gastric bypass. No acute findings in the abdomen or pelvis. Electronically Signed   By: Rolm Baptise M.D.   On: 06/06/2021 19:45    Labs on Admission: I have personally reviewed following labs  CBC: Recent Labs   Lab 06/06/21 1736  WBC 9.1  HGB 5.0*  HCT 17.1*  MCV 80.7  PLT 314   Basic Metabolic Panel: Recent Labs  Lab 06/06/21 1736  NA 126*  K 3.8  CL 93*  CO2 25  GLUCOSE 131*  BUN 12  CREATININE 0.68  CALCIUM 8.3*   GFR: Estimated Creatinine Clearance: 61.9 mL/min (by C-G formula based on SCr of 0.68 mg/dL).  Liver Function Tests: Recent Labs  Lab 06/06/21 1736  AST 75*  ALT 39  ALKPHOS 59  BILITOT 0.6  PROT 6.2*  ALBUMIN 3.8   Coagulation Profile: Recent Labs  Lab 06/06/21 1736  INR 1.1   Urine analysis:    Component Value Date/Time   COLORURINE YELLOW (A) 06/10/2020 0000   APPEARANCEUR CLEAR (A) 06/10/2020 0000   LABSPEC 1.023 06/10/2020 0000   PHURINE 6.0 06/10/2020 0000   GLUCOSEU NEGATIVE 06/10/2020 0000   GLUCOSEU NEGATIVE 03/03/2017 0830   HGBUR NEGATIVE 06/10/2020 0000   BILIRUBINUR NEGATIVE 06/10/2020 0000   KETONESUR NEGATIVE 06/10/2020 0000   PROTEINUR NEGATIVE 06/10/2020 0000   UROBILINOGEN 0.2 03/03/2017 0830   NITRITE NEGATIVE 06/10/2020 0000   LEUKOCYTESUR NEGATIVE 06/10/2020 0000   CRITICAL CARE  Performed by: Briant Cedar Bergen Magner  Total critical care time: 35 minutes  Critical care time was exclusive of separately billable procedures and treating other patients.  Critical care was necessary to treat or prevent imminent or life-threatening deterioration.  Critical care was time spent personally by me on the following activities: development of treatment plan with patient and/or surrogate as well as nursing, discussions with consultants, evaluation of patient's response to treatment, examination of patient, obtaining history from patient or surrogate, ordering and performing treatments and interventions, ordering and review of laboratory studies, ordering and review of radiographic studies, pulse oximetry and re-evaluation of patient's condition.  Dr. Tobie Poet [facility]  If 7PM-7AM, please contact overnight-coverage provider If 7AM-7PM,  please contact day coverage provider [email or web address]  06/07/2021, 12:39 AM

## 2021-06-07 ENCOUNTER — Encounter: Payer: Self-pay | Admitting: Internal Medicine

## 2021-06-07 ENCOUNTER — Ambulatory Visit: Payer: Medicare Other | Admitting: Family

## 2021-06-07 ENCOUNTER — Encounter
Admission: EM | Disposition: A | Discharge status: Home / Self Care | Payer: Self-pay | Source: Home / Self Care | Attending: Internal Medicine

## 2021-06-07 DIAGNOSIS — D62 Acute posthemorrhagic anemia: Secondary | ICD-10-CM

## 2021-06-07 DIAGNOSIS — K921 Melena: Principal | ICD-10-CM

## 2021-06-07 DIAGNOSIS — K922 Gastrointestinal hemorrhage, unspecified: Secondary | ICD-10-CM | POA: Diagnosis not present

## 2021-06-07 DIAGNOSIS — F1010 Alcohol abuse, uncomplicated: Secondary | ICD-10-CM

## 2021-06-07 HISTORY — PX: GIVENS CAPSULE STUDY: SHX5432

## 2021-06-07 LAB — BASIC METABOLIC PANEL
Anion gap: 7 (ref 5–15)
BUN: 14 mg/dL (ref 8–23)
CO2: 28 mmol/L (ref 22–32)
Calcium: 8.8 mg/dL — ABNORMAL LOW (ref 8.9–10.3)
Chloride: 103 mmol/L (ref 98–111)
Creatinine, Ser: 0.76 mg/dL (ref 0.44–1.00)
GFR, Estimated: >60 mL/min (ref >60)
Glucose, Bld: 109 mg/dL — ABNORMAL HIGH (ref 70–99)
Potassium: 4.2 mmol/L (ref 3.5–5.1)
Sodium: 138 mmol/L (ref 135–145)

## 2021-06-07 LAB — CBC
HCT: 27.5 % — ABNORMAL LOW (ref 36.0–46.0)
Hemoglobin: 9 g/dL — ABNORMAL LOW (ref 12.0–15.0)
MCH: 27.1 pg (ref 26.0–34.0)
MCHC: 32.7 g/dL (ref 30.0–36.0)
MCV: 82.8 fL (ref 80.0–100.0)
Platelets: 149 10*3/uL — ABNORMAL LOW (ref 150–400)
RBC: 3.32 MIL/uL — ABNORMAL LOW (ref 3.87–5.11)
RDW: 18.5 % — ABNORMAL HIGH (ref 11.5–15.5)
WBC: 6.8 10*3/uL (ref 4.0–10.5)
nRBC: 0 % (ref 0.0–0.2)

## 2021-06-07 LAB — HEMOGLOBIN AND HEMATOCRIT, BLOOD
HCT: 23.3 % — ABNORMAL LOW (ref 36.0–46.0)
Hemoglobin: 7.5 g/dL — ABNORMAL LOW (ref 12.0–15.0)

## 2021-06-07 LAB — GLUCOSE, CAPILLARY: Glucose-Capillary: 127 mg/dL — ABNORMAL HIGH (ref 70–99)

## 2021-06-07 LAB — PREPARE RBC (CROSSMATCH)

## 2021-06-07 SURGERY — IMAGING PROCEDURE, GI TRACT, INTRALUMINAL, VIA CAPSULE

## 2021-06-07 MED ORDER — LORATADINE 10 MG PO TABS
10.0000 mg | ORAL_TABLET | Freq: Every day | ORAL | Status: DC
  Administered 2021-06-07 – 2021-06-10 (×4): 10 mg via ORAL
  Filled 2021-06-07 (×4): qty 1

## 2021-06-07 MED ORDER — FLUTICASONE PROPIONATE 50 MCG/ACT NA SUSP
1.0000 | Freq: Every day | NASAL | Status: DC
  Administered 2021-06-07 – 2021-06-10 (×3): 1 via NASAL
  Filled 2021-06-07 (×2): qty 16

## 2021-06-07 MED ORDER — CHLORHEXIDINE GLUCONATE CLOTH 2 % EX PADS
6.0000 | MEDICATED_PAD | Freq: Every day | CUTANEOUS | Status: DC
  Administered 2021-06-07 – 2021-06-10 (×4): 6 via TOPICAL

## 2021-06-07 MED ORDER — HYDRALAZINE HCL 10 MG PO TABS
10.0000 mg | ORAL_TABLET | Freq: Four times a day (QID) | ORAL | Status: DC | PRN
  Filled 2021-06-07: qty 1

## 2021-06-07 MED ORDER — ACYCLOVIR 200 MG PO CAPS
400.0000 mg | ORAL_CAPSULE | Freq: Every day | ORAL | Status: DC
  Administered 2021-06-07 – 2021-06-10 (×4): 400 mg via ORAL
  Filled 2021-06-07 (×5): qty 2

## 2021-06-07 MED ORDER — SIMETHICONE 80 MG PO CHEW
80.0000 mg | CHEWABLE_TABLET | Freq: Four times a day (QID) | ORAL | Status: DC | PRN
  Administered 2021-06-09: 80 mg via ORAL
  Filled 2021-06-07 (×2): qty 1

## 2021-06-07 MED ORDER — SODIUM CHLORIDE 0.9% IV SOLUTION: Freq: Once | INTRAVENOUS | Status: AC

## 2021-06-07 NOTE — Assessment & Plan Note (Signed)
-   Currently on Protonix gtt.

## 2021-06-07 NOTE — Assessment & Plan Note (Signed)
This meets criteria for morbid obesity based on the presence of 1 or more chronic comorbidities (hypertension).  Patient has 33.4 BMI. This complicates overall care and prognosis.

## 2021-06-07 NOTE — Consult Note (Signed)
Jennifer Darby, MD [address]  [place]  [place], [place] [address]  Main: [phone]  Fax: [phone] Pager: [phone]   Consultation  Referring Provider:     No ref. provider found Primary Care Physician:  [name], FNP Primary Gastroenterologist:  Dr. [name]     Reason for Consultation:     Rectal bleeding, acute blood loss anemia  Date of Admission:  06/06/2021 Date of Consultation:  06/07/2021         HPI:   Jennifer [name] is a 71 y.o. female history of gastric sleeve followed by duodenal switch history of internal hemorrhoids, s/p outpatient hemorrhoid ligation of left lateral and right posterior hemorrhoids who presents with recurrence of acute blood loss anemia secondary to hematochezia.Patient presents with generalized weakness, her hemoglobin dropped from 10 about 3 weeks ago to 5 on arrival, received 3 units of PRBCs and responded appropriately.  BUN mildly elevated compared to baseline but within normal limits.  Patient was seen by Dr. Tasia Catchings in last week of April, received parenteral iron.  Her hemoglobin was 10 at that time.  Patient underwent CT angio abdomen and pelvis, with no source of bleeding identified.  She also underwent CT head without contrast, with no evidence of intracranial bleed.   Patient had several episodes of hematochezia in the past.  She underwent EGD and colonoscopy in 11/2020 with no source of bleeding identified other than internal hemorrhoids.  She had upper endoscopy and colonoscopy in 11/2019 as well with no other source identified.  She had recurrence of hematochezia in January 2023, flexible sigmoidoscopy revealed brown stool and hemorrhoids only.  Subsequently, underwent outpatient hemorrhoid ligation by Dr. [name] of the left lateral and right posterior hemorrhoids. Patient did not have any further episodes of bleeding since admission.    NSAIDs: None  Antiplts/Anticoagulants/Anti thrombotics: None  GI Procedures:  Reviewed from care everywhere 10/01/2019: Colonoscopy: Prior history of adenomatous polyps.  A 4 mm polyp noted in the ascending colon.  Resected.  On retroflexion internal hemorrhoids seen on pictures.  The polyp was a tubular adenoma. 11/19/2020: EGD for dysphagia :Post surgical changes but no strictures. Colonoscopy was normal except for hemorrhoids but prep was poor, brown stool Flexible sigmoidoscopy for hematochezia 02/06/2021, brown stool and hemorrhoids only  Past Medical History:  Diagnosis Date   Anemia    Anxiety    Bariatric surgery status    Constipation    COVID-19    Dysrhythmia    Elevated liver enzymes    GERD (gastroesophageal reflux disease)    Hemorrhoids    Herpes genitalis    High cholesterol    Hyperlipidemia    Hypertension    Hypothyroidism    IDA (iron deficiency anemia) 02/09/2021   Neuropathy    Osteoarthritis    Sleep apnea     Past Surgical History:  Procedure Laterality Date   ABDOMINAL HYSTERECTOMY     total for fibroids no h/o abnormal pap   bariatric sleeve  2015   BREAST EXCISIONAL BIOPSY Left 1998   carpal tunnel repair     COLONOSCOPY WITH PROPOFOL N/A 02/11/2016   Procedure: COLONOSCOPY WITH PROPOFOL;  Surgeon: Jonathon Bellows, MD;  Location: [facility] ENDOSCOPY;  Service: Endoscopy;  Laterality: N/A;   COLONOSCOPY WITH PROPOFOL N/A 10/01/2019   Procedure: COLONOSCOPY WITH PROPOFOL;  Surgeon: Jonathon Bellows, MD;  Location: [facility] ENDOSCOPY;  Service: Gastroenterology;  Laterality: N/A;   COLONOSCOPY WITH PROPOFOL N/A 11/19/2020   Procedure: COLONOSCOPY WITH PROPOFOL;  Surgeon: Jonathon Bellows, MD;  Location: [facility] ENDOSCOPY;  Service: Gastroenterology;  Laterality: N/A;   ESOPHAGOGASTRODUODENOSCOPY (EGD) WITH PROPOFOL N/A 12/02/2019   Procedure: ESOPHAGOGASTRODUODENOSCOPY (EGD) WITH PROPOFOL;  Surgeon: Jonathon Bellows, MD;  Location: [facility] ENDOSCOPY;  Service: Gastroenterology;  Laterality: N/A;   ESOPHAGOGASTRODUODENOSCOPY (EGD) WITH PROPOFOL N/A 11/19/2020    Procedure: ESOPHAGOGASTRODUODENOSCOPY (EGD) WITH PROPOFOL;  Surgeon: Jonathon Bellows, MD;  Location: [facility] ENDOSCOPY;  Service: Gastroenterology;  Laterality: N/A;   FLEXIBLE SIGMOIDOSCOPY N/A 02/06/2021   Procedure: FLEXIBLE SIGMOIDOSCOPY;  Surgeon: [name], DO;  Location: [facility] ENDOSCOPY;  Service: Gastroenterology;  Laterality: N/A;   HEMORRHOID SURGERY     LAPAROSCOPIC GASTRIC RESTRICTIVE DUODENAL PROCEDURE (DUODENAL SWITCH) Bilateral    2020    Current Facility-Administered Medications:    acetaminophen (TYLENOL) tablet 650 mg, 650 mg, Oral, Q6H PRN, 650 mg at 06/07/21 1039 **OR** acetaminophen (TYLENOL) suppository 650 mg, 650 mg, Rectal, Q6H PRN, Cox, Amy N, DO   acyclovir (ZOVIRAX) 200 MG capsule 400 mg, 400 mg, Oral, Q0600, Wyvonnia Dusky, MD, 400 mg at 06/07/21 5465   famotidine (PEPCID) tablet 20 mg, 20 mg, Oral, QHS, Cox, Amy N, DO, 20 mg at 06/06/21 2253   fluticasone (FLONASE) 50 MCG/ACT nasal spray 1 spray, 1 spray, Each Nare, Daily, Wyvonnia Dusky, MD, 1 spray at 03/54/65 6812   folic acid (FOLVITE) tablet 1 mg, 1 mg, Oral, Daily, Cox, Amy N, DO, 1 mg at 06/07/21 7517   gabapentin (NEURONTIN) capsule 300 mg, 300 mg, Oral, TID, Cox, Amy N, DO, 300 mg at 06/07/21 0017   hydrALAZINE (APRESOLINE) tablet 10 mg, 10 mg, Oral, Q6H PRN, Cox, Amy N, DO   levothyroxine (SYNTHROID) tablet 50 mcg, 50 mcg, Oral, Q0600, Cox, Amy N, DO, 50 mcg at 06/07/21 4944   loratadine (CLARITIN) tablet 10 mg, 10 mg, Oral, Daily, Jimmye Norman, Jamiese M, MD   LORazepam (ATIVAN) tablet 1-4 mg, 1-4 mg, Oral, Q1H PRN **OR** LORazepam (ATIVAN) injection 1-4 mg, 1-4 mg, Intravenous, Q1H PRN, Cox, Amy N, DO   losartan (COZAAR) tablet 100 mg, 100 mg, Oral, Daily, Cox, Amy N, DO, 100 mg at 06/07/21 9675   multivitamin with minerals tablet 1 tablet, 1 tablet, Oral, Daily, Cox, Amy N, DO, 1 tablet at 06/07/21 0853   ondansetron (ZOFRAN) tablet 4 mg, 4 mg, Oral, Q6H PRN **OR** ondansetron (ZOFRAN)  injection 4 mg, 4 mg, Intravenous, Q6H PRN, Cox, Amy N, DO   [START ON 06/10/2021] pantoprazole (PROTONIX) injection 40 mg, 40 mg, Intravenous, Q12H, Cox, Amy N, DO   pantoprozole (PROTONIX) 80 mg /NS 100 mL infusion, 8 mg/hr, Intravenous, Continuous, Cox, Amy N, DO, Last Rate: 10 mL/hr at 06/07/21 0804, 8 mg/hr at 06/07/21 0804   sertraline (ZOLOFT) tablet 50 mg, 50 mg, Oral, QHS, Cox, Amy N, DO   simethicone (MYLICON) chewable tablet 80 mg, 80 mg, Oral, Q6H PRN, Wyvonnia Dusky, MD   thiamine tablet 100 mg, 100 mg, Oral, Daily **OR** thiamine (B-1) injection 100 mg, 100 mg, Intravenous, Daily, Cox, Amy N, DO, 100 mg at 06/07/21 9163   triamterene-hydrochlorothiazide (MAXZIDE-25) 37.5-25 MG per tablet 1 tablet, 1 tablet, Oral, Daily, Cox, Amy N, DO, 1 tablet at 06/07/21 8466    Family History  Problem Relation Age of Onset   Breast cancer Sister 82       materal 1/2 sister   Hypertension Sister    Hypertension Mother    Heart disease Father    Hypertension Brother      Social History  Tobacco Use   Smoking status: Former    Packs/day: 1.50    Years: 24.00    Pack years: 36.00    Types: Cigarettes    Quit date: 1993    Years since quitting: 30.4   Smokeless tobacco: Never   Tobacco comments:    quit 1995.   Vaping Use   Vaping Use: Never used  Substance Use Topics   Alcohol use: Not Currently   Drug use: No    Allergies as of 06/06/2021 - Review Complete 06/06/2021  Allergen Reaction Noted   Celecoxib Hives 07/25/2016   Hydrocodone Nausea Only 06/02/2020   Pollen extract     Sodium ferric gluconate [ferrous gluconate]  11/20/2020   Nasacort [triamcinolone] Other (See Comments) 12/07/2015   Vicodin [hydrocodone-acetaminophen] Nausea Only 09/03/2014    Review of Systems:    All systems reviewed and negative except where noted in HPI.   Physical Exam:  Vital signs in last 24 hours: Temp:  [97.7 F (36.5 C)-98.9 F (37.2 C)] 98.7 F (37.1 C) (05/22  1200) Pulse Rate:  [61-92] 63 (05/22 1200) Resp:  [12-23] 18 (05/22 1200) BP: (96-161)/(46-75) 123/46 (05/22 1200) SpO2:  [97 %-100 %] 98 % (05/22 1200) Weight:  [80.3 kg-82.2 kg] 82.2 kg (05/21 2130) Last BM Date : 06/06/21 General:   Pleasant, cooperative in NAD Head:  Normocephalic and atraumatic. Eyes:   No icterus.   Conjunctiva pink. PERRLA. Ears:  Normal auditory acuity. Neck:  Supple; no masses or thyroidomegaly Lungs: Respirations even and unlabored. Lungs clear to auscultation bilaterally.   No wheezes, crackles, or rhonchi.  Heart:  Regular rate and rhythm;  Without murmur, clicks, rubs or gallops Abdomen:  Soft, nondistended, nontender. Normal bowel sounds. No appreciable masses or hepatomegaly.  No rebound or guarding.  Rectal:  Not performed. Msk:  Symmetrical without gross deformities.  Strength generalized weakness Extremities:  Without edema, cyanosis or clubbing. Neurologic:  Alert and oriented x3;  grossly normal neurologically. Skin:  Intact without significant lesions or rashes. Psych:  Alert and cooperative. Normal affect.  LAB RESULTS:    Latest Ref Rng & Units 06/07/2021    6:14 AM 06/07/2021   12:57 AM 06/06/2021    5:36 PM  CBC  WBC 4.0 - 10.5 K/uL 6.8    9.1    Hemoglobin 12.0 - 15.0 g/dL 9.0   7.5   5.0    Hematocrit 36.0 - 46.0 % 27.5   23.3   17.1    Platelets 150 - 400 K/uL 149    193      BMET    Latest Ref Rng & Units 06/07/2021    6:14 AM 06/06/2021    5:36 PM 03/19/2021   11:15 AM  BMP  Glucose 70 - 99 mg/dL 109   131   99    BUN 8 - 23 mg/dL $Remove'14   12   9    'IaLNhhK$ Creatinine 0.44 - 1.00 mg/dL 0.76   0.68   0.74    Sodium 135 - 145 mmol/L 138   126   137    Potassium 3.5 - 5.1 mmol/L 4.2   3.8   5.0    Chloride 98 - 111 mmol/L 103   93   100    CO2 22 - 32 mmol/L $RemoveB'28   25   28    'AULWannt$ Calcium 8.9 - 10.3 mg/dL 8.8   8.3   9.3      LFT    Latest Ref Rng &  Units 06/06/2021    5:36 PM 02/09/2021   10:17 AM 02/05/2021    8:14 AM  Hepatic Function   Total Protein 6.5 - 8.1 g/dL 6.2   7.1   6.4    Albumin 3.5 - 5.0 g/dL 3.8   4.3   3.8    AST 15 - 41 U/L 75   87   115    ALT 0 - 44 U/L 39   48   51    Alk Phosphatase 38 - 126 U/L 59   84   73    Total Bilirubin 0.3 - 1.2 mg/dL 0.6   0.3   0.4    Bilirubin, Direct 0.0 - 0.2 mg/dL  0.2   <0.1       STUDIES: CT HEAD WO CONTRAST (5MM)  Result Date: 06/06/2021 CLINICAL DATA:  71 year old female with syncope. EXAM: CT HEAD WITHOUT CONTRAST TECHNIQUE: Contiguous axial images were obtained from the base of the skull through the vertex without intravenous contrast. RADIATION DOSE REDUCTION: This exam was performed according to the departmental dose-optimization program which includes automated exposure control, adjustment of the mA and/or kV according to patient size and/or use of iterative reconstruction technique. COMPARISON:  03/10/2020 CT and 05/14/2021 brain MR FINDINGS: Brain: No evidence of acute infarction, hemorrhage, hydrocephalus, extra-axial collection or mass lesion/mass effect. Mild atrophy again noted. Vascular: Carotid atherosclerotic calcifications are noted. Skull: Normal. Negative for fracture or focal lesion. Sinuses/Orbits: No acute finding. Other: None. IMPRESSION: 1. No evidence of acute intracranial abnormality. 2. Mild atrophy. Electronically Signed   By: Margarette Canada M.D.   On: 06/06/2021 19:24   CT Angio Abd/Pel W and/or Wo Contrast  Result Date: 06/06/2021 CLINICAL DATA:  GI bleed, lower BRB per rectum EXAM: CTA ABDOMEN AND PELVIS WITHOUT AND WITH CONTRAST TECHNIQUE: Multidetector CT imaging of the abdomen and pelvis was performed using the standard protocol during bolus administration of intravenous contrast. Multiplanar reconstructed images and MIPs were obtained and reviewed to evaluate the vascular anatomy. RADIATION DOSE REDUCTION: This exam was performed according to the departmental dose-optimization program which includes automated exposure control, adjustment of the  mA and/or kV according to patient size and/or use of iterative reconstruction technique. CONTRAST:  157mL OMNIPAQUE IOHEXOL 350 MG/ML SOLN COMPARISON:  06/09/2020 FINDINGS: VASCULAR Aorta: Aortic atherosclerosis.  No aneurysm or dissection. Celiac: Patent without evidence of aneurysm, dissection, vasculitis or significant stenosis. SMA: Patent without evidence of aneurysm, dissection, vasculitis or significant stenosis. Renals: Both renal arteries are patent without evidence of aneurysm, dissection, vasculitis, fibromuscular dysplasia or significant stenosis. IMA: Patent without evidence of aneurysm, dissection, vasculitis or significant stenosis. Inflow: Atherosclerosis.  No aneurysm or dissection. Proximal Outflow: Atherosclerosis.  No aneurysm or dissection. Veins: No obvious venous abnormality within the limitations of this arterial phase study. Review of the MIP images confirms the above findings. NON-VASCULAR Lower chest: In no acute abnormality Hepatobiliary: No focal liver abnormality is seen. Status post cholecystectomy. No biliary dilatation. Pancreas: No focal abnormality or ductal dilatation. Spleen: No focal abnormality.  Normal size. Adrenals/Urinary Tract: No adrenal abnormality. No focal renal abnormality. No stones or hydronephrosis. Urinary bladder is unremarkable. Stomach/Bowel: Postoperative changes from prior gastric bypass. No contrast extravasation to localize GI bleed. No bowel obstruction. Lymphatic: No adenopathy Reproductive: Prior hysterectomy.  No adnexal masses. Other: No free fluid or free air. Musculoskeletal: No acute bony abnormality. IMPRESSION: VASCULAR No contrast extravasation seen to localize GI bleed. NON-VASCULAR Prior gastric bypass. No acute findings in the abdomen or pelvis.  Electronically Signed   By: Rolm Baptise M.D.   On: 06/06/2021 19:45      Impression / Plan:   Jennifer [name] is a 71 y.o. female with history of gastric sleeve, duodenal switch with recurrent  episodes of hematochezia resulting in severe acute blood loss anemia.  Patient underwent several bidirectional endoscopy within last 3 years and no luminal source of bleeding identified.  The source of bleeding was thought to be secondary to bleeding from internal hemorrhoids, underwent outpatient hemorrhoid ligation of the left lateral and right posterior hemorrhoids.  Patient presents with recurrence of hematochezia with acute blood loss anemia and presyncope. CT angio abdomen and pelvis were negative for active extravasation.  Given that patient underwent upper endoscopy and colonoscopy November 2022 with no source of bleeding identified, recommend to proceed with video capsule endoscopy, patient is agreeable.  If this is unremarkable, can proceed with repeat flexible sigmoidoscopy or colonoscopy and performing patient hemorrhoid ligation if needed or general surgery consult to evaluate for hemorrhoidectomy Monitor CBC closely, maintain hemoglobin above 7 Most recent iron levels appear to be normal, normal serum copper levels Okay to continue IV Protonix, although does not appear to be an upper GI bleed   Thank you for involving me in the care of this patient.  GI will follow along with you    LOS: 1 day   Sherri Sear, MD  06/07/2021, 4:14 PM    Note: This dictation was prepared with Dragon dictation along with smaller phrase technology. Any transcriptional errors that result from this process are unintentional.

## 2021-06-07 NOTE — Assessment & Plan Note (Signed)
-   Losartan 100 mg daily, triamterene-hydrochlorothiazide 37.5-25 mg daily resumed for 06/07/2021  - Hydralazine 10 mg p.o. every 6 hours as needed for SBP greater than 175, 4 days ordered

## 2021-06-07 NOTE — Progress Notes (Signed)
PROGRESS NOTE    Jennifer [name]  OMV:[id number] DOB: 1950-08-12 DOA: 06/06/2021 PCP: [name], FNP   Assessment & Plan:   Principal Problem:   Upper GI bleed Active Problems:   Obesity, unspecified   Hyperlipidemia   Essential hypertension   GERD (gastroesophageal reflux disease)   Hypothyroidism   Depression   History of sleeve gastrectomy   Hypercholesterolemia   Morbid obesity (HCC)   Obstructive sleep apnea   Symptomatic anemia   GI bleed   Anxiety and depression   Acute blood loss anemia   IDA (iron deficiency anemia)   Alcohol abuse  Assessment and Plan: GI bleed: etiology unclear, possibly secondary to alcohol abuse. Hx of gastric sleeve surgery & hx of bleeding hemorrhoids (previously had some hemorrhoids banded). Continue on IV PPI. S/p 3 units of pRBCs transfused. Rarely uses NSAIDs. Will have video capsule endoscopy today as per GI.  GI recs apprec    Alcohol abuse: continue on CIWA protocol. Alcohol cessation counseling   Acute blood loss anemia: likely secondary to GI bleed. S/p 3 units of pRBCs transfused. Continue to monitor H&H  Thrombocytopenia: etiology unclear, possible secondary to alcohol abuse. Will continue to monitor    Depression: severity unknown. Continue on home dose of sertraline    Hypothyroidism: continue on home dose of levothyroxine    GERD: continue on PPI    HTN: continue on losartan, triamterene-HCTZ. Hydralazine prn    Obesity: BMI 34.2. Complicates overall care & prognosis       DVT prophylaxis: SCDs Code Status: full  Family Communication: discussed pt's care w/ pt's family at bedside and answered their questions  Disposition Plan: unclear   Level of care: Stepdown  Status is: Inpatient Remains inpatient appropriate because: severity of illness    Consultants:  GI   Procedures:   Antimicrobials:    Subjective: Pt c/o fatigue  Objective: Vitals:   06/07/21 0445 06/07/21 0500 06/07/21 0600  06/07/21 0700  BP: 115/60 (!) 107/59 (!) 111/58 (!) 116/57  Pulse: 65 66 69 61  Resp: '18 17 15 16  '$ Temp: 98.4 F (36.9 C)     TempSrc: Oral     SpO2: 99% 97% 98% 99%  Weight:      Height:        Intake/Output Summary (Last 24 hours) at 06/07/2021 0818 Last data filed at 06/07/2021 0416 Gross per 24 hour  Intake 1582.85 ml  Output 3025 ml  Net -1442.15 ml   Filed Weights   06/06/21 1734 06/06/21 2130  Weight: 80.3 kg 82.2 kg    Examination:  General exam: Appears calm and comfortable  Respiratory system: Clear to auscultation. Respiratory effort normal. Cardiovascular system: S1 & S2 +. No rubs, gallops or clicks.  Gastrointestinal system: Abdomen is nondistended, soft and nontender.  Normal bowel sounds heard. Central nervous system: Alert and oriented. Moves all extremities  Psychiatry: Judgement and insight appear normal. Flat mood and affect     Data Reviewed: I have personally reviewed following labs and imaging studies  CBC: Recent Labs  Lab 06/06/21 1736 06/07/21 0057 06/07/21 0614  WBC 9.1  --  6.8  HGB 5.0* 7.5* 9.0*  HCT 17.1* 23.3* 27.5*  MCV 80.7  --  82.8  PLT 193  --  628*   Basic Metabolic Panel: Recent Labs  Lab 06/06/21 1736 06/07/21 0614  NA 126* 138  K 3.8 4.2  CL 93* 103  CO2 25 28  GLUCOSE 131* 109*  BUN 12 14  CREATININE 0.68 0.76  CALCIUM 8.3* 8.8*   GFR: Estimated Creatinine Clearance: 62.7 mL/min (by C-G formula based on SCr of 0.76 mg/dL). Liver Function Tests: Recent Labs  Lab 06/06/21 1736  AST 75*  ALT 39  ALKPHOS 59  BILITOT 0.6  PROT 6.2*  ALBUMIN 3.8   No results for input(s): LIPASE, AMYLASE in the last 168 hours. No results for input(s): AMMONIA in the last 168 hours. Coagulation Profile: Recent Labs  Lab 06/06/21 1736  INR 1.1   Cardiac Enzymes: No results for input(s): CKTOTAL, CKMB, CKMBINDEX, TROPONINI in the last 168 hours. BNP (last 3 results) No results for input(s): PROBNP in the last 8760  hours. HbA1C: No results for input(s): HGBA1C in the last 72 hours. CBG: Recent Labs  Lab 06/06/21 2113  GLUCAP 127*   Lipid Profile: No results for input(s): CHOL, HDL, LDLCALC, TRIG, CHOLHDL, LDLDIRECT in the last 72 hours. Thyroid Function Tests: No results for input(s): TSH, T4TOTAL, FREET4, T3FREE, THYROIDAB in the last 72 hours. Anemia Panel: No results for input(s): VITAMINB12, FOLATE, FERRITIN, TIBC, IRON, RETICCTPCT in the last 72 hours. Sepsis Labs: No results for input(s): PROCALCITON, LATICACIDVEN in the last 168 hours.  No results found for this or any previous visit (from the past 240 hour(s)).       Radiology Studies: CT HEAD WO CONTRAST (5MM)  Result Date: 06/06/2021 CLINICAL DATA:  71 year old female with syncope. EXAM: CT HEAD WITHOUT CONTRAST TECHNIQUE: Contiguous axial images were obtained from the base of the skull through the vertex without intravenous contrast. RADIATION DOSE REDUCTION: This exam was performed according to the departmental dose-optimization program which includes automated exposure control, adjustment of the mA and/or kV according to patient size and/or use of iterative reconstruction technique. COMPARISON:  03/10/2020 CT and 05/14/2021 brain MR FINDINGS: Brain: No evidence of acute infarction, hemorrhage, hydrocephalus, extra-axial collection or mass lesion/mass effect. Mild atrophy again noted. Vascular: Carotid atherosclerotic calcifications are noted. Skull: Normal. Negative for fracture or focal lesion. Sinuses/Orbits: No acute finding. Other: None. IMPRESSION: 1. No evidence of acute intracranial abnormality. 2. Mild atrophy. Electronically Signed   By: Margarette Canada M.D.   On: 06/06/2021 19:24   CT Angio Abd/Pel W and/or Wo Contrast  Result Date: 06/06/2021 CLINICAL DATA:  GI bleed, lower BRB per rectum EXAM: CTA ABDOMEN AND PELVIS WITHOUT AND WITH CONTRAST TECHNIQUE: Multidetector CT imaging of the abdomen and pelvis was performed using the  standard protocol during bolus administration of intravenous contrast. Multiplanar reconstructed images and MIPs were obtained and reviewed to evaluate the vascular anatomy. RADIATION DOSE REDUCTION: This exam was performed according to the departmental dose-optimization program which includes automated exposure control, adjustment of the mA and/or kV according to patient size and/or use of iterative reconstruction technique. CONTRAST:  164m OMNIPAQUE IOHEXOL 350 MG/ML SOLN COMPARISON:  06/09/2020 FINDINGS: VASCULAR Aorta: Aortic atherosclerosis.  No aneurysm or dissection. Celiac: Patent without evidence of aneurysm, dissection, vasculitis or significant stenosis. SMA: Patent without evidence of aneurysm, dissection, vasculitis or significant stenosis. Renals: Both renal arteries are patent without evidence of aneurysm, dissection, vasculitis, fibromuscular dysplasia or significant stenosis. IMA: Patent without evidence of aneurysm, dissection, vasculitis or significant stenosis. Inflow: Atherosclerosis.  No aneurysm or dissection. Proximal Outflow: Atherosclerosis.  No aneurysm or dissection. Veins: No obvious venous abnormality within the limitations of this arterial phase study. Review of the MIP images confirms the above findings. NON-VASCULAR Lower chest: In no acute abnormality Hepatobiliary: No focal liver abnormality is seen. Status post cholecystectomy. No biliary  dilatation. Pancreas: No focal abnormality or ductal dilatation. Spleen: No focal abnormality.  Normal size. Adrenals/Urinary Tract: No adrenal abnormality. No focal renal abnormality. No stones or hydronephrosis. Urinary bladder is unremarkable. Stomach/Bowel: Postoperative changes from prior gastric bypass. No contrast extravasation to localize GI bleed. No bowel obstruction. Lymphatic: No adenopathy Reproductive: Prior hysterectomy.  No adnexal masses. Other: No free fluid or free air. Musculoskeletal: No acute bony abnormality. IMPRESSION:  VASCULAR No contrast extravasation seen to localize GI bleed. NON-VASCULAR Prior gastric bypass. No acute findings in the abdomen or pelvis. Electronically Signed   By: Rolm Baptise M.D.   On: 06/06/2021 19:45        Scheduled Meds:  acyclovir  400 mg Oral Q0600   famotidine  20 mg Oral QHS   fluticasone  1 spray Each Nare Daily   folic acid  1 mg Oral Daily   gabapentin  300 mg Oral TID   levothyroxine  50 mcg Oral Q0600   losartan  100 mg Oral Daily   multivitamin with minerals  1 tablet Oral Daily   [START ON 06/10/2021] pantoprazole  40 mg Intravenous Q12H   sertraline  50 mg Oral QHS   thiamine  100 mg Oral Daily   Or   thiamine  100 mg Intravenous Daily   triamterene-hydrochlorothiazide  1 tablet Oral Daily   Continuous Infusions:  pantoprazole 8 mg/hr (06/07/21 0804)     LOS: 1 day    Time spent: 35 mins     Wyvonnia Dusky, MD [facility] Pager [phone]  If 7PM-7AM, please contact night-coverage 06/07/2021, 8:18 AM

## 2021-06-07 NOTE — Assessment & Plan Note (Signed)
-   Levothyroxine 50 mcg daily resumed ?

## 2021-06-07 NOTE — Assessment & Plan Note (Signed)
-   Sertraline 50 mg nightly resumed

## 2021-06-08 ENCOUNTER — Other Ambulatory Visit: Payer: Self-pay | Admitting: Radiology

## 2021-06-08 ENCOUNTER — Encounter: Payer: Self-pay | Admitting: Gastroenterology

## 2021-06-08 ENCOUNTER — Inpatient Hospital Stay: Payer: Medicare Other

## 2021-06-08 DIAGNOSIS — F1010 Alcohol abuse, uncomplicated: Secondary | ICD-10-CM | POA: Diagnosis not present

## 2021-06-08 DIAGNOSIS — D62 Acute posthemorrhagic anemia: Secondary | ICD-10-CM | POA: Diagnosis not present

## 2021-06-08 DIAGNOSIS — K922 Gastrointestinal hemorrhage, unspecified: Secondary | ICD-10-CM | POA: Diagnosis not present

## 2021-06-08 LAB — TYPE AND SCREEN
ABO/RH(D): O POS
Antibody Screen: NEGATIVE
Unit division: 0
Unit division: 0
Unit division: 0
Unit division: 0
Unit division: 0

## 2021-06-08 LAB — BPAM RBC
Blood Product Expiration Date: 202306182359
Blood Product Expiration Date: 202306182359
Blood Product Expiration Date: 202306202359
Blood Product Expiration Date: 202306202359
Blood Product Expiration Date: 202306302359
ISSUE DATE / TIME: 202305211857
ISSUE DATE / TIME: 202305212205
ISSUE DATE / TIME: 202305220210
Unit Type and Rh: 5100
Unit Type and Rh: 5100
Unit Type and Rh: 5100
Unit Type and Rh: 5100
Unit Type and Rh: 5100

## 2021-06-08 LAB — BASIC METABOLIC PANEL
Anion gap: 7 (ref 5–15)
BUN: 13 mg/dL (ref 8–23)
CO2: 27 mmol/L (ref 22–32)
Calcium: 8.8 mg/dL — ABNORMAL LOW (ref 8.9–10.3)
Chloride: 103 mmol/L (ref 98–111)
Creatinine, Ser: 0.84 mg/dL (ref 0.44–1.00)
GFR, Estimated: >60 mL/min (ref >60)
Glucose, Bld: 105 mg/dL — ABNORMAL HIGH (ref 70–99)
Potassium: 4.4 mmol/L (ref 3.5–5.1)
Sodium: 137 mmol/L (ref 135–145)

## 2021-06-08 LAB — HEMOGLOBIN AND HEMATOCRIT, BLOOD
HCT: 32 % — ABNORMAL LOW (ref 36.0–46.0)
Hemoglobin: 10 g/dL — ABNORMAL LOW (ref 12.0–15.0)

## 2021-06-08 LAB — CBC
HCT: 27.7 % — ABNORMAL LOW (ref 36.0–46.0)
Hemoglobin: 8.7 g/dL — ABNORMAL LOW (ref 12.0–15.0)
MCH: 26 pg (ref 26.0–34.0)
MCHC: 31.4 g/dL (ref 30.0–36.0)
MCV: 82.7 fL (ref 80.0–100.0)
Platelets: 179 10*3/uL (ref 150–400)
RBC: 3.35 MIL/uL — ABNORMAL LOW (ref 3.87–5.11)
RDW: 18.6 % — ABNORMAL HIGH (ref 11.5–15.5)
WBC: 7.6 10*3/uL (ref 4.0–10.5)
nRBC: 0 % (ref 0.0–0.2)

## 2021-06-08 MED ORDER — TECHNETIUM TC 99M-LABELED RED BLOOD CELLS IV KIT
20.0000 | PACK | Freq: Once | INTRAVENOUS | Status: AC
  Administered 2021-06-08: 22.74 via INTRAVENOUS

## 2021-06-08 MED ORDER — POLYETHYLENE GLYCOL 3350 17 GM/SCOOP PO POWD
1.0000 | Freq: Once | ORAL | Status: AC
  Administered 2021-06-08: 255 g via ORAL
  Filled 2021-06-08: qty 255

## 2021-06-08 MED ORDER — FAMOTIDINE 20 MG PO TABS: 20.0000 mg | ORAL_TABLET | Freq: Every day | ORAL | Status: DC | PRN

## 2021-06-08 MED ORDER — MENTHOL 3 MG MT LOZG
1.0000 | LOZENGE | OROMUCOSAL | Status: DC | PRN
  Administered 2021-06-08 – 2021-06-09 (×2): 3 mg via ORAL
  Filled 2021-06-08: qty 9

## 2021-06-08 NOTE — Progress Notes (Signed)
Jennifer Darby, MD [address]  [place]  [place], [place] [address]  Main: [phone]  Fax: [phone] Pager: [phone]   Subjective: No acute events overnight.  Patient did not have a bowel movement since admission.  Her hemoglobin slightly dropped from 9 to 8.7 within last 24 hours.  Patient underwent video capsule endoscopy yesterday.  She has been transferred out of ICU to regular floor.   Objective: Vital signs in last 24 hours: Vitals:   06/07/21 2000 06/07/21 2344 06/08/21 0537 06/08/21 0813  BP:  139/64 122/68 (!) 141/63  Pulse:  62 (!) 57 (!) 58  Resp:  '18 18 19  '$ Temp: 98.8 F (37.1 C) 98 F (36.7 C) 98.1 F (36.7 C) 97.6 F (36.4 C)  TempSrc: Oral Oral Oral Oral  SpO2:  100% 99% 100%  Weight:      Height:       Weight change:   Intake/Output Summary (Last 24 hours) at 06/08/2021 1240 Last data filed at 06/08/2021 0900 Gross per 24 hour  Intake 251.5 ml  Output --  Net 251.5 ml    Exam: Heart:: Regular rate and rhythm, S1S2 present, or without murmur or extra heart sounds Lungs: normal and clear to auscultation Abdomen: soft, nontender, normal bowel sounds   Lab Results:    Latest Ref Rng & Units 06/08/2021    5:48 AM 06/07/2021    6:14 AM 06/07/2021   12:57 AM  CBC  WBC 4.0 - 10.5 K/uL 7.6   6.8     Hemoglobin 12.0 - 15.0 g/dL 8.7   9.0   7.5    Hematocrit 36.0 - 46.0 % 27.7   27.5   23.3    Platelets 150 - 400 K/uL 179   149         Latest Ref Rng & Units 06/08/2021    5:48 AM 06/07/2021    6:14 AM 06/06/2021    5:36 PM  CMP  Glucose 70 - 99 mg/dL 105   109   131    BUN 8 - 23 mg/dL '13   14   12    '$ Creatinine 0.44 - 1.00 mg/dL 0.84   0.76   0.68    Sodium 135 - 145 mmol/L 137   138   126    Potassium 3.5 - 5.1 mmol/L 4.4   4.2   3.8    Chloride 98 - 111 mmol/L 103   103   93    CO2 22 - 32 mmol/L '27   28   25    '$ Calcium 8.9 - 10.3 mg/dL 8.8   8.8   8.3    Total Protein 6.5 - 8.1 g/dL   6.2    Total Bilirubin 0.3 - 1.2  mg/dL   0.6    Alkaline Phos 38 - 126 U/L   59    AST 15 - 41 U/L   75    ALT 0 - 44 U/L   39      Micro Results: No results found for this or any previous visit (from the past 240 hour(s)). Studies/Results: CT HEAD WO CONTRAST (5MM)  Result Date: 06/06/2021 CLINICAL DATA:  71 year old female with syncope. EXAM: CT HEAD WITHOUT CONTRAST TECHNIQUE: Contiguous axial images were obtained from the base of the skull through the vertex without intravenous contrast. RADIATION DOSE REDUCTION: This exam was performed according to the departmental dose-optimization program which includes automated exposure control, adjustment of the mA and/or kV  according to patient size and/or use of iterative reconstruction technique. COMPARISON:  03/10/2020 CT and 05/14/2021 brain MR FINDINGS: Brain: No evidence of acute infarction, hemorrhage, hydrocephalus, extra-axial collection or mass lesion/mass effect. Mild atrophy again noted. Vascular: Carotid atherosclerotic calcifications are noted. Skull: Normal. Negative for fracture or focal lesion. Sinuses/Orbits: No acute finding. Other: None. IMPRESSION: 1. No evidence of acute intracranial abnormality. 2. Mild atrophy. Electronically Signed   By: Margarette Canada M.D.   On: 06/06/2021 19:24   CT Angio Abd/Pel W and/or Wo Contrast  Result Date: 06/06/2021 CLINICAL DATA:  GI bleed, lower BRB per rectum EXAM: CTA ABDOMEN AND PELVIS WITHOUT AND WITH CONTRAST TECHNIQUE: Multidetector CT imaging of the abdomen and pelvis was performed using the standard protocol during bolus administration of intravenous contrast. Multiplanar reconstructed images and MIPs were obtained and reviewed to evaluate the vascular anatomy. RADIATION DOSE REDUCTION: This exam was performed according to the departmental dose-optimization program which includes automated exposure control, adjustment of the mA and/or kV according to patient size and/or use of iterative reconstruction technique. CONTRAST:  119m  OMNIPAQUE IOHEXOL 350 MG/ML SOLN COMPARISON:  06/09/2020 FINDINGS: VASCULAR Aorta: Aortic atherosclerosis.  No aneurysm or dissection. Celiac: Patent without evidence of aneurysm, dissection, vasculitis or significant stenosis. SMA: Patent without evidence of aneurysm, dissection, vasculitis or significant stenosis. Renals: Both renal arteries are patent without evidence of aneurysm, dissection, vasculitis, fibromuscular dysplasia or significant stenosis. IMA: Patent without evidence of aneurysm, dissection, vasculitis or significant stenosis. Inflow: Atherosclerosis.  No aneurysm or dissection. Proximal Outflow: Atherosclerosis.  No aneurysm or dissection. Veins: No obvious venous abnormality within the limitations of this arterial phase study. Review of the MIP images confirms the above findings. NON-VASCULAR Lower chest: In no acute abnormality Hepatobiliary: No focal liver abnormality is seen. Status post cholecystectomy. No biliary dilatation. Pancreas: No focal abnormality or ductal dilatation. Spleen: No focal abnormality.  Normal size. Adrenals/Urinary Tract: No adrenal abnormality. No focal renal abnormality. No stones or hydronephrosis. Urinary bladder is unremarkable. Stomach/Bowel: Postoperative changes from prior gastric bypass. No contrast extravasation to localize GI bleed. No bowel obstruction. Lymphatic: No adenopathy Reproductive: Prior hysterectomy.  No adnexal masses. Other: No free fluid or free air. Musculoskeletal: No acute bony abnormality. IMPRESSION: VASCULAR No contrast extravasation seen to localize GI bleed. NON-VASCULAR Prior gastric bypass. No acute findings in the abdomen or pelvis. Electronically Signed   By: KRolm BaptiseM.D.   On: 06/06/2021 19:45   Medications: I have reviewed the patient's current medications. Prior to Admission:  Medications Prior to Admission  Medication Sig Dispense Refill Last Dose   acetaminophen (TYLENOL) 325 MG tablet Take 2 tablets (650 mg total) by  mouth every 6 (six) hours as needed for mild pain (or Fever >/= 101).   06/06/2021   acyclovir (ZOVIRAX) 400 MG tablet TAKE 1 TABLET BY MOUTH EVERY DAY (Patient taking differently: Take 400 mg by mouth daily at 6 (six) AM.) 30 tablet 5 06/06/2021   calcium citrate (CALCITRATE - DOSED IN MG ELEMENTAL CALCIUM) 950 (200 Ca) MG tablet Take 200 mg of elemental calcium by mouth daily.   06/06/2021   famotidine (PEPCID) 20 MG tablet TAKE 1 TABLET BY MOUTH EVERY NIGHT AT BEDTIME (Patient taking differently: Take 20 mg by mouth at bedtime.) 90 tablet 1 06/05/2021   fexofenadine (ALLEGRA ALLERGY) 180 MG tablet Take 1 tablet (180 mg total) by mouth daily. 90 tablet 1 06/06/2021   fluticasone (FLONASE) 50 MCG/ACT nasal spray Place 2 sprays into both nostrils daily. 1Bradley  g 0 06/06/2021   gabapentin (NEURONTIN) 300 MG capsule Take 1 capsule (300 mg total) by mouth 3 (three) times daily. 180 capsule 2 06/06/2021   levothyroxine (SYNTHROID) 50 MCG tablet TAKE 1 TABLET BY MOUTH EVERY DAY (Patient taking differently: Take 50 mcg by mouth daily before breakfast.) 90 tablet 1 06/06/2021   losartan (COZAAR) 100 MG tablet TAKE 1 TABLET BY MOUTH EVERY DAY AT BEDTIME (Patient taking differently: Take 100 mg by mouth daily.) 30 tablet 2 06/06/2021   Multiple Vitamin (MULTIVITAMIN WITH MINERALS) TABS tablet Take 1 tablet by mouth daily.   06/06/2021   sertraline (ZOLOFT) 50 MG tablet Take 50 mg by mouth at bedtime.   06/05/2021   triamterene-hydrochlorothiazide (DYAZIDE) 37.5-25 MG capsule Take 1 each (1 capsule total) by mouth daily. 90 capsule 1 06/06/2021   Ascorbic Acid (VITAMIN C PO) Take by mouth. (Patient not taking: Reported on 06/06/2021)   Not Taking   hydrocortisone (ANUSOL-HC) 25 MG suppository Place 1 suppository (25 mg total) rectally 2 (two) times daily. 12 suppository 1 prn   ketoconazole (NIZORAL) 2 % cream Apply 1 application topically daily. Apply on bottom of foot and thin layer between toes 60 g 2 prn   sodium chloride  (OCEAN) 0.65 % SOLN nasal spray Place 2 sprays into both nostrils as needed for congestion. 30 mL 2 prn   Scheduled:  acyclovir  400 mg Oral Q0600   Chlorhexidine Gluconate Cloth  6 each Topical Daily   famotidine  20 mg Oral QHS   fluticasone  1 spray Each Nare Daily   folic acid  1 mg Oral Daily   gabapentin  300 mg Oral TID   levothyroxine  50 mcg Oral Q0600   loratadine  10 mg Oral Daily   losartan  100 mg Oral Daily   multivitamin with minerals  1 tablet Oral Daily   [START ON 06/10/2021] pantoprazole  40 mg Intravenous Q12H   sertraline  50 mg Oral QHS   thiamine  100 mg Oral Daily   Or   thiamine  100 mg Intravenous Daily   triamterene-hydrochlorothiazide  1 tablet Oral Daily   Continuous:  pantoprazole 8 mg/hr (06/08/21 0647)   EXH:[id number] **OR** acetaminophen, famotidine, hydrALAZINE, LORazepam **OR** LORazepam, ondansetron **OR** ondansetron (ZOFRAN) IV, simethicone Anti-infectives (From admission, onward)    Start     Dose/Rate Route Frequency Ordered Stop   06/07/21 0800  acyclovir (ZOVIRAX) 200 MG capsule 400 mg        400 mg Oral Daily 06/07/21 0658     06/07/21 0600  acyclovir (ZOVIRAX) tablet 400 mg  Status:  Discontinued        400 mg Oral Daily 06/06/21 2237 06/07/21 0657      Scheduled Meds:  acyclovir  400 mg Oral Q0600   Chlorhexidine Gluconate Cloth  6 each Topical Daily   famotidine  20 mg Oral QHS   fluticasone  1 spray Each Nare Daily   folic acid  1 mg Oral Daily   gabapentin  300 mg Oral TID   levothyroxine  50 mcg Oral Q0600   loratadine  10 mg Oral Daily   losartan  100 mg Oral Daily   multivitamin with minerals  1 tablet Oral Daily   [START ON 06/10/2021] pantoprazole  40 mg Intravenous Q12H   sertraline  50 mg Oral QHS   thiamine  100 mg Oral Daily   Or   thiamine  100 mg Intravenous Daily   triamterene-hydrochlorothiazide  1 tablet  Oral Daily   Continuous Infusions:  pantoprazole 8 mg/hr (06/08/21 0647)   PRN  Meds:.acetaminophen **OR** acetaminophen, famotidine, hydrALAZINE, LORazepam **OR** LORazepam, ondansetron **OR** ondansetron (ZOFRAN) IV, simethicone   Assessment: Principal Problem:   Upper GI bleed Active Problems:   Obesity, unspecified   Hyperlipidemia   Essential hypertension   GERD (gastroesophageal reflux disease)   Hypothyroidism   Depression   History of sleeve gastrectomy   Hypercholesterolemia   Morbid obesity (HCC)   Obstructive sleep apnea   Symptomatic anemia   GI bleed   Anxiety and depression   Acute blood loss anemia   IDA (iron deficiency anemia)   Alcohol abuse  Jennifer [name] is a 71 y.o. female with history of gastric sleeve, duodenal switch with recurrent episodes of hematochezia resulting in severe acute blood loss anemia.  Patient underwent several bidirectional endoscopy within last 3 years and no luminal source of bleeding identified.  The source of bleeding was thought to be secondary to bleeding from internal hemorrhoids, underwent outpatient hemorrhoid ligation of the left lateral and right posterior hemorrhoids.  Patient presents with recurrence of hematochezia with acute blood loss anemia and presyncope. CT angio abdomen and pelvis were negative for active extravasation.  Apparently, patient took 2 pills of aspirin 81 mg prior to onset of hematochezia  Plan: Hematochezia, obscure GI bleed Patient underwent video capsule endoscopy which revealed old blood in the distal small bowel/proximal colon and an area of fresh blood in the proximal colon.  There was large amount of liquid stool in the small intestine as well as colon, the study was suboptimal to identify the exact source Recommend tagged RBC scan, if this is negative to localize the bleeding source, will repeat video capsule endoscopy after full bowel prep Monitor CBC closely to maintain hemoglobin above 7 GI will follow along with you   LOS: 2 days   Jennifer [name] 06/08/2021, 12:40 PM

## 2021-06-08 NOTE — Progress Notes (Signed)
PROGRESS NOTE   HPI was taken from Dr. Tobie Poet: Jennifer [name] is a 71 year old female with history of alcohol use/abuse, neuropathy, hypothyroid, depression, GERD, anxiety, morbid obesity, who presents emergency department for chief concerns of rectal bleeding and black stool.   Initial vitals in the emergency department showed temperature of 98.2, respiration rate of 18, heart rate of 88, blood pressure 149/72, SPO2 100% on room air.  Serum sodium is 126, potassium 3.8, chloride 93, bicarb 25, BUN of 12, serum creatinine of 0.68, nonfasting glucose 131, GFR greater than 60.  WBC 9.1, hemoglobin 5, platelets of 193.   CT abdomen and pelvis with and without contrast was read as: No contrast extravasation seen to localize GI bleed.  Prior gastric bypass.  No acute findings in the abdomen or pelvis.   Per ED provider, it was reported that patient was in the ED waiting room and had 1 episode of tremulous activity concerning for presyncope.  EDP treatment: 2 units of PRBC, Protonix 40 mg IV one-time dose, ondansetron 4 mg IV.   At bedside she is able to tell me her name, her age, current calendar year.  She knows she is in the hospital.   She states that on Friday, May 19, she had several episodes of black stool with bloody bowel movements.   She had 1 episode of nausea on day of presentation and states that the vomitus was food with white sputum/saliva.   She denies chest pain, abdominal pain, swelling in her legs, dysuria, diarrhea.  She endorses dyspnea with exertion.   Jennifer [name]  GYI:[id number] DOB: 06/02/1950 DOA: 06/06/2021 PCP: [name], FNP   Assessment & Plan:   Principal Problem:   Upper GI bleed Active Problems:   Obesity, unspecified   Hyperlipidemia   Essential hypertension   GERD (gastroesophageal reflux disease)   Hypothyroidism   Depression   History of sleeve gastrectomy   Hypercholesterolemia   Morbid obesity (HCC)   Obstructive sleep  apnea   Symptomatic anemia   GI bleed   Anxiety and depression   Acute blood loss anemia   IDA (iron deficiency anemia)   Alcohol abuse  Assessment and Plan: GI bleed: etiology unclear, possibly secondary to alcohol abuse. Hx of gastric sleeve surgery & hx of bleeding hemorrhoids (previously had some hemorrhoids banded). Continue on IV PPI. S/p 3 units of pRBCs transfused. Rarely uses NSAIDs. S/p video capsule endoscopy that showed old blood in the distal small bowel/proximal colon & an area of fresh blood in the proximal colon. Tagged RBC scan was ordered as per GI. If neg to localize the bleeding source, will repeat video capsule endoscopy after full bowel prep as per GI. GI recs apprec    Alcohol abuse: continue on CIWA protocol. Received alcohol cessation counseling   Acute blood loss anemia: likely secondary above GI bleed. S/p 3 units of pRBCs transfused. Will transfuse if Hb < 7  Thrombocytopenia: possibly secondary to alcohol abuse. WNL today    Depression: severity unknown. Continue on home dose of sertraline.   Hypothyroidism: continue on home dose of levothyroxine    GERD:  continue on PPI    HTN: continue on triamterene-HCTZ, losartan. Hydralazine prn    Obesity: BMI 34.2. Complicates overall care & prognosis       DVT prophylaxis: SCDs Code Status: full  Family Communication:  Disposition Plan: likely d/c back home    Level of care: Med-Surg  Status is: Inpatient Remains inpatient appropriate because: severity of illness  Consultants:  GI   Procedures:   Antimicrobials:    Subjective: Pt c/o malaise   Objective: Vitals:   06/07/21 1600 06/07/21 2000 06/07/21 2344 06/08/21 0537  BP: 131/72  139/64 122/68  Pulse: (!) 59  62 (!) 57  Resp: '14  18 18  '$ Temp:  98.8 F (37.1 C) 98 F (36.7 C) 98.1 F (36.7 C)  TempSrc:  Oral Oral Oral  SpO2: 100%  100% 99%  Weight:      Height:        Intake/Output Summary (Last 24 hours) at 06/08/2021  0731 Last data filed at 06/07/2021 1700 Gross per 24 hour  Intake 131.5 ml  Output --  Net 131.5 ml   Filed Weights   06/06/21 1734 06/06/21 2130  Weight: 80.3 kg 82.2 kg    Examination:  General exam: Appears comfortable.  Respiratory system: clear breath sounds b/l  Cardiovascular system: S1/S2+. No rubs or clicks   Gastrointestinal system: Abd is soft, NT, obese & hypoactive bowel sounds  Central nervous system: alert and oriented. Moves all extremities  Psychiatry: Judgement and insight appears normal. Flat mood and affect     Data Reviewed: I have personally reviewed following labs and imaging studies  CBC: Recent Labs  Lab 06/06/21 1736 06/07/21 0057 06/07/21 0614 06/08/21 0548  WBC 9.1  --  6.8 7.6  HGB 5.0* 7.5* 9.0* 8.7*  HCT 17.1* 23.3* 27.5* 27.7*  MCV 80.7  --  82.8 82.7  PLT 193  --  149* 209   Basic Metabolic Panel: Recent Labs  Lab 06/06/21 1736 06/07/21 0614 06/08/21 0548  NA 126* 138 137  K 3.8 4.2 4.4  CL 93* 103 103  CO2 '25 28 27  '$ GLUCOSE 131* 109* 105*  BUN '12 14 13  '$ CREATININE 0.68 0.76 0.84  CALCIUM 8.3* 8.8* 8.8*   GFR: Estimated Creatinine Clearance: 59.7 mL/min (by C-G formula based on SCr of 0.84 mg/dL). Liver Function Tests: Recent Labs  Lab 06/06/21 1736  AST 75*  ALT 39  ALKPHOS 59  BILITOT 0.6  PROT 6.2*  ALBUMIN 3.8   No results for input(s): LIPASE, AMYLASE in the last 168 hours. No results for input(s): AMMONIA in the last 168 hours. Coagulation Profile: Recent Labs  Lab 06/06/21 1736  INR 1.1   Cardiac Enzymes: No results for input(s): CKTOTAL, CKMB, CKMBINDEX, TROPONINI in the last 168 hours. BNP (last 3 results) No results for input(s): PROBNP in the last 8760 hours. HbA1C: No results for input(s): HGBA1C in the last 72 hours. CBG: Recent Labs  Lab 06/06/21 2113  GLUCAP 127*   Lipid Profile: No results for input(s): CHOL, HDL, LDLCALC, TRIG, CHOLHDL, LDLDIRECT in the last 72 hours. Thyroid  Function Tests: No results for input(s): TSH, T4TOTAL, FREET4, T3FREE, THYROIDAB in the last 72 hours. Anemia Panel: No results for input(s): VITAMINB12, FOLATE, FERRITIN, TIBC, IRON, RETICCTPCT in the last 72 hours. Sepsis Labs: No results for input(s): PROCALCITON, LATICACIDVEN in the last 168 hours.  No results found for this or any previous visit (from the past 240 hour(s)).       Radiology Studies: CT HEAD WO CONTRAST (5MM)  Result Date: 06/06/2021 CLINICAL DATA:  71 year old female with syncope. EXAM: CT HEAD WITHOUT CONTRAST TECHNIQUE: Contiguous axial images were obtained from the base of the skull through the vertex without intravenous contrast. RADIATION DOSE REDUCTION: This exam was performed according to the departmental dose-optimization program which includes automated exposure control, adjustment of the mA and/or kV according  to patient size and/or use of iterative reconstruction technique. COMPARISON:  03/10/2020 CT and 05/14/2021 brain MR FINDINGS: Brain: No evidence of acute infarction, hemorrhage, hydrocephalus, extra-axial collection or mass lesion/mass effect. Mild atrophy again noted. Vascular: Carotid atherosclerotic calcifications are noted. Skull: Normal. Negative for fracture or focal lesion. Sinuses/Orbits: No acute finding. Other: None. IMPRESSION: 1. No evidence of acute intracranial abnormality. 2. Mild atrophy. Electronically Signed   By: Margarette Canada M.D.   On: 06/06/2021 19:24   CT Angio Abd/Pel W and/or Wo Contrast  Result Date: 06/06/2021 CLINICAL DATA:  GI bleed, lower BRB per rectum EXAM: CTA ABDOMEN AND PELVIS WITHOUT AND WITH CONTRAST TECHNIQUE: Multidetector CT imaging of the abdomen and pelvis was performed using the standard protocol during bolus administration of intravenous contrast. Multiplanar reconstructed images and MIPs were obtained and reviewed to evaluate the vascular anatomy. RADIATION DOSE REDUCTION: This exam was performed according to the  departmental dose-optimization program which includes automated exposure control, adjustment of the mA and/or kV according to patient size and/or use of iterative reconstruction technique. CONTRAST:  192m OMNIPAQUE IOHEXOL 350 MG/ML SOLN COMPARISON:  06/09/2020 FINDINGS: VASCULAR Aorta: Aortic atherosclerosis.  No aneurysm or dissection. Celiac: Patent without evidence of aneurysm, dissection, vasculitis or significant stenosis. SMA: Patent without evidence of aneurysm, dissection, vasculitis or significant stenosis. Renals: Both renal arteries are patent without evidence of aneurysm, dissection, vasculitis, fibromuscular dysplasia or significant stenosis. IMA: Patent without evidence of aneurysm, dissection, vasculitis or significant stenosis. Inflow: Atherosclerosis.  No aneurysm or dissection. Proximal Outflow: Atherosclerosis.  No aneurysm or dissection. Veins: No obvious venous abnormality within the limitations of this arterial phase study. Review of the MIP images confirms the above findings. NON-VASCULAR Lower chest: In no acute abnormality Hepatobiliary: No focal liver abnormality is seen. Status post cholecystectomy. No biliary dilatation. Pancreas: No focal abnormality or ductal dilatation. Spleen: No focal abnormality.  Normal size. Adrenals/Urinary Tract: No adrenal abnormality. No focal renal abnormality. No stones or hydronephrosis. Urinary bladder is unremarkable. Stomach/Bowel: Postoperative changes from prior gastric bypass. No contrast extravasation to localize GI bleed. No bowel obstruction. Lymphatic: No adenopathy Reproductive: Prior hysterectomy.  No adnexal masses. Other: No free fluid or free air. Musculoskeletal: No acute bony abnormality. IMPRESSION: VASCULAR No contrast extravasation seen to localize GI bleed. NON-VASCULAR Prior gastric bypass. No acute findings in the abdomen or pelvis. Electronically Signed   By: KRolm BaptiseM.D.   On: 06/06/2021 19:45        Scheduled Meds:   acyclovir  400 mg Oral Q0600   Chlorhexidine Gluconate Cloth  6 each Topical Daily   famotidine  20 mg Oral QHS   fluticasone  1 spray Each Nare Daily   folic acid  1 mg Oral Daily   gabapentin  300 mg Oral TID   levothyroxine  50 mcg Oral Q0600   loratadine  10 mg Oral Daily   losartan  100 mg Oral Daily   multivitamin with minerals  1 tablet Oral Daily   [START ON 06/10/2021] pantoprazole  40 mg Intravenous Q12H   sertraline  50 mg Oral QHS   thiamine  100 mg Oral Daily   Or   thiamine  100 mg Intravenous Daily   triamterene-hydrochlorothiazide  1 tablet Oral Daily   Continuous Infusions:  pantoprazole 8 mg/hr (06/08/21 0647)     LOS: 2 days    Time spent: 35 mins     JWyvonnia Dusky MD [facility] Pager [phone]  If 7PM-7AM, please contact night-coverage  06/08/2021, 7:31 AM

## 2021-06-09 ENCOUNTER — Encounter: Payer: Self-pay | Admitting: Gastroenterology

## 2021-06-09 ENCOUNTER — Encounter
Admission: EM | Disposition: A | Discharge status: Home / Self Care | Payer: Self-pay | Source: Home / Self Care | Attending: Internal Medicine

## 2021-06-09 DIAGNOSIS — Z903 Acquired absence of stomach [part of]: Secondary | ICD-10-CM

## 2021-06-09 DIAGNOSIS — E7800 Pure hypercholesterolemia, unspecified: Secondary | ICD-10-CM

## 2021-06-09 DIAGNOSIS — F419 Anxiety disorder, unspecified: Secondary | ICD-10-CM

## 2021-06-09 DIAGNOSIS — E038 Other specified hypothyroidism: Secondary | ICD-10-CM

## 2021-06-09 DIAGNOSIS — D649 Anemia, unspecified: Secondary | ICD-10-CM

## 2021-06-09 DIAGNOSIS — I10 Essential (primary) hypertension: Secondary | ICD-10-CM | POA: Diagnosis not present

## 2021-06-09 DIAGNOSIS — K922 Gastrointestinal hemorrhage, unspecified: Secondary | ICD-10-CM | POA: Diagnosis not present

## 2021-06-09 DIAGNOSIS — G4733 Obstructive sleep apnea (adult) (pediatric): Secondary | ICD-10-CM

## 2021-06-09 DIAGNOSIS — D62 Acute posthemorrhagic anemia: Secondary | ICD-10-CM | POA: Diagnosis not present

## 2021-06-09 DIAGNOSIS — F32A Depression, unspecified: Secondary | ICD-10-CM

## 2021-06-09 HISTORY — PX: GIVENS CAPSULE STUDY: SHX5432

## 2021-06-09 LAB — BASIC METABOLIC PANEL
Anion gap: 9 (ref 5–15)
BUN: 6 mg/dL — ABNORMAL LOW (ref 8–23)
CO2: 26 mmol/L (ref 22–32)
Calcium: 9 mg/dL (ref 8.9–10.3)
Chloride: 100 mmol/L (ref 98–111)
Creatinine, Ser: 0.76 mg/dL (ref 0.44–1.00)
GFR, Estimated: >60 mL/min (ref >60)
Glucose, Bld: 114 mg/dL — ABNORMAL HIGH (ref 70–99)
Potassium: 3 mmol/L — ABNORMAL LOW (ref 3.5–5.1)
Sodium: 135 mmol/L (ref 135–145)

## 2021-06-09 LAB — CBC
HCT: 30.7 % — ABNORMAL LOW (ref 36.0–46.0)
Hemoglobin: 9.8 g/dL — ABNORMAL LOW (ref 12.0–15.0)
MCH: 26.3 pg (ref 26.0–34.0)
MCHC: 31.9 g/dL (ref 30.0–36.0)
MCV: 82.5 fL (ref 80.0–100.0)
Platelets: 220 10*3/uL (ref 150–400)
RBC: 3.72 MIL/uL — ABNORMAL LOW (ref 3.87–5.11)
RDW: 18.4 % — ABNORMAL HIGH (ref 11.5–15.5)
WBC: 10.4 10*3/uL (ref 4.0–10.5)
nRBC: 0 % (ref 0.0–0.2)

## 2021-06-09 SURGERY — IMAGING PROCEDURE, GI TRACT, INTRALUMINAL, VIA CAPSULE

## 2021-06-09 MED ORDER — POTASSIUM CHLORIDE 10 MEQ/100ML IV SOLN
10.0000 meq | INTRAVENOUS | Status: AC
  Administered 2021-06-09 (×6): 10 meq via INTRAVENOUS
  Filled 2021-06-09 (×6): qty 100

## 2021-06-09 MED ORDER — POTASSIUM CHLORIDE 10 MEQ/100ML IV SOLN: 10.0000 meq | INTRAVENOUS | Status: DC

## 2021-06-09 NOTE — TOC Initial Note (Signed)
Transition of Care [facility]) - Initial/Assessment Note    Patient Details  Name: Jennifer [name] MRN: [id number] Date of Birth: August 15, 1950  Transition of Care [facility]) CM/SW Contact:    Candie Chroman, LCSW Phone Number: 06/09/2021, 11:12 AM  Clinical Narrative:  CSW met with patient. No supports at bedside. CSW introduced role and inquired about interest in counseling resources for ETOH use. Patient is agreeable. Provided list. No further concerns. CSW encouraged patient to contact CSW as needed. CSW will continue to follow patient for support and facilitate return home when stable. Her husband will take her home once discharged.                Expected Discharge Plan: Home/Self Care Barriers to Discharge: Continued Medical Work up   Patient Goals and CMS Choice        Expected Discharge Plan and Services Expected Discharge Plan: Home/Self Care     Post Acute Care Choice: NA Living arrangements for the past 2 months: Single Family Home                                      Prior Living Arrangements/Services Living arrangements for the past 2 months: Single Family Home Lives with:: Spouse Patient language and need for interpreter reviewed:: Yes Do you feel safe going back to the place where you live?: Yes      Need for Family Participation in Patient Care: Yes (Comment) Care giver support system in place?: Yes (comment)   Criminal Activity/Legal Involvement Pertinent to Current Situation/Hospitalization: No - Comment as needed  Activities of Daily Living Home Assistive Devices/Equipment: None ADL Screening (condition at time of admission) Patient's cognitive ability adequate to safely complete daily activities?: Yes Is the patient deaf or have difficulty hearing?: No Does the patient have difficulty seeing, even when wearing glasses/contacts?: No Does the patient have difficulty concentrating, remembering, or making decisions?: No Patient able to express need for  assistance with ADLs?: Yes Does the patient have difficulty dressing or bathing?: No Independently performs ADLs?: Yes (appropriate for developmental age) Does the patient have difficulty walking or climbing stairs?: No Weakness of Legs: Both Weakness of Arms/Hands: None  Permission Sought/Granted                  Emotional Assessment Appearance:: Appears stated age Attitude/Demeanor/Rapport: Engaged, Gracious Affect (typically observed): Accepting, Appropriate, Calm, Pleasant Orientation: : Oriented to Self, Oriented to Place, Oriented to  Time, Oriented to Situation Alcohol / Substance Use: Alcohol Use Psych Involvement: No (comment)  Admission diagnosis:  Upper GI bleed [K92.2] Symptomatic anemia [D64.9] Gastrointestinal hemorrhage, unspecified gastrointestinal hemorrhage type [K92.2] Patient Active Problem List   Diagnosis Date Noted   Upper GI bleed 06/06/2021   Alcohol abuse 06/06/2021   Eye discharge 02/16/2021   IDA (iron deficiency anemia) 02/09/2021   Acute blood loss anemia 02/06/2021   Elevated liver function tests    Bright red blood per rectum 02/05/2021   Open wound of left heel 01/20/2021   Anxiety and depression 11/25/2020   CPAP (continuous positive airway pressure) dependence 11/25/2020   Sinusitis 11/25/2020   Allergic conjunctivitis of both eyes and rhinitis 11/25/2020   GI bleed 11/17/2020   Chronic iron deficiency anemia    Symptomatic anemia 11/11/2020   Postop check 08/07/2020   Biliary sludge determined by ultrasound 07/02/2020   Bile leak 06/12/2020   Right upper quadrant abdominal pain 06/10/2020  Lumbar back pain 05/18/2020   Muscle spasm 05/18/2020   Bilateral tinnitus 04/30/2020   Headache 03/30/2020   Irregular heartbeat 02/25/2020   Diabetes mellitus without complication ([place]) 10/15/5745   Strain of right knee 02/10/2020   Aspirin long-term use 10/22/2019   Wound ballistics 08/29/2019   Displacement of lumbar intervertebral  disc without myelopathy 34/03/7094   Helicobacter pylori gastrointestinal tract infection 08/29/2019   Hypercholesterolemia 08/29/2019   Phlebitis after infusion 06/26/2019   Superficial thrombophlebitis of left upper extremity 06/26/2019   BMI 38.0-38.9,adult 06/19/2019   Small bowel obstruction ([place]) 05/25/2019   Morbid obesity ([place]) 05/16/2019   Cervical pain (neck) 04/09/2019   Pedal edema 03/06/2019   Pre-operative clearance 03/06/2019   History of sleeve gastrectomy 01/23/2019   Chronic pain syndrome 10/03/2018   Obstructive sleep apnea 10/03/2018   Right shoulder pain 06/08/2018   Chronic venous insufficiency 05/24/2018   Lymphedema 05/24/2018   Lumbar spondylosis 05/17/2018   Leg swelling 05/09/2018   Neuropathy 01/24/2018   Insomnia 07/12/2017   Fracture of phalanx of finger 05/31/2017   Impingement syndrome of shoulder region 05/31/2017   Hemorrhoids 12/15/2016   Hypothyroidism 10/03/2016   Chronic shoulder bursitis 08/30/2016   Positive ANA (antinuclear antibody) 08/30/2016   Bradycardia 06/16/2016   History of adenomatous polyp of colon 05/02/2016   Elevated liver enzymes 04/06/2016   OSA (obstructive sleep apnea) 04/06/2016   Bilateral shoulder pain 01/13/2016   Fatty liver 12/08/2015   Arthritis 12/08/2015   History of bariatric surgery 12/07/2015   Genital herpes 11/11/2015   Hyperlipidemia 11/11/2015   Essential hypertension 11/11/2015   Routine physical examination 11/11/2015   Allergic rhinitis 11/11/2015   GERD (gastroesophageal reflux disease) 11/11/2015   Herpesviral infection, unspecified 01/08/2014   Disorder of thyroid, unspecified 01/08/2014   Lumbar radiculitis 11/19/2013   Neuritis or radiculitis due to rupture of lumbar intervertebral disc 11/19/2013   Monilial vaginitis 08/20/2013   Gastritis and duodenitis 07/25/2013   Impaired fasting glucose 07/17/2012   Obesity, unspecified 07/17/2012   Obesity 07/17/2012   Depression 03/15/2012    Abnormal electrocardiogram (ECG) (EKG) 03/06/2012   URI (upper respiratory infection) 01/16/2012   PCP:  [name], FNP Pharmacy:   [id number] - [place], [place] - [address] [place] [address] Phone: [phone] Fax: [phone]     Social Determinants of Health (SDOH) Interventions    Readmission Risk Interventions     View : No data to display.

## 2021-06-09 NOTE — Progress Notes (Signed)
PROGRESS NOTE  Jennifer [name]    DOB: October 21, 1950, 71 y.o.  JYN:[id number]    Code Status: Full Code   DOA: 06/06/2021   LOS: 3   Brief hospital course  Jennifer [name] is a 71 y.o. female with a PMH significant for EtOH dependence, neuropathy, hypothyroid, depression, GERD, anxiety, morbid obesity.  They presented from home to the ED on 06/06/2021 with rectal bleeding x 3 days. She also had associated N/V, and SOB.  In the ED, it was found that they had stable vital signs.  Significant findings included hgb 5, platelets 193, WBCs 9.1, sodium 126, potassium 3.8. CT abdomen and pelvis negative for evidence of localized GI bleed or perforation.  Evidence of prior gastric bypass and no other acute findings.  They were initially treated with 2 units PRBCs, IV Protonix bolus, ondansetron IV. GI was consulted for further evaluation.  Patient was admitted to medicine service for further workup and management of GI bleed as outlined in detail below.  5/22-video capsule endoscopy-positive for distal small bowel/proximal colon fresh blood.  Poor study given bowel contents obscuring 5/23-RBC scan negative for signs of acute bleed 5/24-repeat video capsule endoscopy following bowel prep  06/09/21 -stable, improved  Assessment & Plan  Principal Problem:   Upper GI bleed Active Problems:   Obesity, unspecified   Hyperlipidemia   Essential hypertension   GERD (gastroesophageal reflux disease)   Hypothyroidism   Depression   History of sleeve gastrectomy   Hypercholesterolemia   Morbid obesity (HCC)   Obstructive sleep apnea   Symptomatic anemia   GI bleed   Anxiety and depression   Acute blood loss anemia   IDA (iron deficiency anemia)   Alcohol abuse  Acute anemia  upper GI bleed- hgb 10.0>9.8 today.  Received 3 units PRBCs this admission total.  Transfusion threshold is less than 7 - GI consult, appreciate recommendations  -Repeat video capsule study this a.m.  -Clear liquid  diet  -Continue IV PPI -CBC a.m.   EtOH use- CIWA scores have been 0 for over 24 hours -Continue CIWA checks per shift -Discontinue as needed Ativan as it has not been given or needed   Depression- chronic, stable - Sertraline 50 mg nightly resumed   Hypothyroidism-chronic - Levothyroxine 50 mcg daily resumed   Essential hypertension-well-controlled.  Holding home triamterene/hydrochlorothiazide due to normotensive - Losartan 100 mg daily   Obesity, unspecified This meets criteria for morbid obesity based on the presence of 1 or more chronic comorbidities (hypertension).  Patient has 33.4 BMI. This complicates overall care and prognosis.   Body mass index is 34.24 kg/m.  VTE ppx: Place and maintain sequential compression device Start: 06/07/21 1628 Place TED hose Start: 06/06/21 2008   Diet:     Diet   Diet NPO time specified Except for: [employer], Sips with Meds   Consultants: GI Subjective 06/09/21    Pt reports feels overall well today.  She denies any more bleeding, nausea, vomiting.  She is anxiously waiting to go home.  Able to tolerate liquid diet today and wants to advance.   Objective   Vitals:   06/08/21 1541 06/08/21 1559 06/08/21 2145 06/09/21 0530  BP: 129/60 115/60 (!) 143/70 121/67  Pulse:   (!) 59 60  Resp:    17  Temp:   (!) 97.3 F (36.3 C) 98.4 F (36.9 C)  TempSrc:      SpO2:   100% 100%  Weight:      Height:  Intake/Output Summary (Last 24 hours) at 06/09/2021 0732 Last data filed at 06/08/2021 1800 Gross per 24 hour  Intake 600 ml  Output --  Net 600 ml   Filed Weights   06/06/21 1734 06/06/21 2130  Weight: 80.3 kg 82.2 kg     Physical Exam:  General: awake, alert, NAD HEENT: atraumatic, clear conjunctiva, anicteric sclera, MMM, hearing grossly normal Respiratory: normal respiratory effort. Cardiovascular: quick capillary refill Gastrointestinal: soft, NT, ND Nervous: A&O x3. no gross focal neurologic deficits, normal  speech Extremities: moves all equally, no edema, normal tone Skin: dry, intact, normal temperature, normal color. No rashes, lesions or ulcers on exposed skin Psychiatry: normal mood, congruent affect  Labs   I have personally reviewed the following labs and imaging studies CBC    Component Value Date/Time   WBC 10.4 06/09/2021 0422   RBC 3.72 (L) 06/09/2021 0422   HGB 9.8 (L) 06/09/2021 0422   HGB 10.3 (L) 12/21/2020 1356   HCT 30.7 (L) 06/09/2021 0422   HCT 32.8 (L) 12/21/2020 1356   PLT 220 06/09/2021 0422   PLT 236 12/21/2020 1356   MCV 82.5 06/09/2021 0422   MCV 76 (L) 12/21/2020 1356   MCV 84 06/26/2013 0409   MCH 26.3 06/09/2021 0422   MCHC 31.9 06/09/2021 0422   RDW 18.4 (H) 06/09/2021 0422   RDW 19.6 (H) 12/21/2020 1356   RDW 14.5 06/26/2013 0409   LYMPHSABS 2.5 05/11/2021 1026   LYMPHSABS 1.9 10/14/2020 1038   LYMPHSABS 1.5 06/26/2013 0409   MONOABS 0.6 05/11/2021 1026   MONOABS 0.7 06/26/2013 0409   EOSABS 0.0 05/11/2021 1026   EOSABS 0.0 10/14/2020 1038   EOSABS 0.0 06/26/2013 0409   BASOSABS 0.1 05/11/2021 1026   BASOSABS 0.1 10/14/2020 1038   BASOSABS 0.0 06/26/2013 0409      Latest Ref Rng & Units 06/09/2021    4:22 AM 06/08/2021    5:48 AM 06/07/2021    6:14 AM  BMP  Glucose 70 - 99 mg/dL 114   105   109    BUN 8 - 23 mg/dL '6   13   14    '$ Creatinine 0.44 - 1.00 mg/dL 0.76   0.84   0.76    Sodium 135 - 145 mmol/L 135   137   138    Potassium 3.5 - 5.1 mmol/L 3.0   4.4   4.2    Chloride 98 - 111 mmol/L 100   103   103    CO2 22 - 32 mmol/L '26   27   28    '$ Calcium 8.9 - 10.3 mg/dL 9.0   8.8   8.8      NM GI Blood Loss  Result Date: 06/08/2021 CLINICAL DATA:  Dark red blood in stool on Friday 06/04/2021. Hemoglobin down from 9.8 to 7. History of gastric sleeve, duodenal switch. EXAM: NUCLEAR MEDICINE GASTROINTESTINAL BLEEDING SCAN TECHNIQUE: Sequential abdominal images were obtained following intravenous administration of Tc-69mlabeled red blood  cells. RADIOPHARMACEUTICALS:  22.74 mCi Tc-928mertechnetate in-vitro labeled red cells. COMPARISON:  None Available. FINDINGS: No abnormal focus of activity is demonstrated in the bowel to suggest a lower GI bleed. Normal physiologic biodistribution of the radiotracer is demonstrated throughout the abdomen. IMPRESSION: No evidence of acute GI bleed. Electronically Signed   By: [employer]ane Police.O.   On: 06/08/2021 16:57    Disposition Plan & Communication  Patient status: Inpatient  Admitted From: Home Planned disposition location: Home Anticipated discharge date: 5/25  pending GI signing off and stable hemoglobin  Family Communication: None   Author: Richarda Osmond, DO [facility] 06/09/2021, 7:32 AM   Available by Epic secure chat 7AM-7PM. If 7PM-7AM, please contact night-coverage.  [facility] contact information found on [email or web address].

## 2021-06-10 ENCOUNTER — Telehealth: Payer: Self-pay

## 2021-06-10 DIAGNOSIS — K922 Gastrointestinal hemorrhage, unspecified: Secondary | ICD-10-CM | POA: Diagnosis not present

## 2021-06-10 DIAGNOSIS — F419 Anxiety disorder, unspecified: Secondary | ICD-10-CM | POA: Diagnosis not present

## 2021-06-10 DIAGNOSIS — E782 Mixed hyperlipidemia: Secondary | ICD-10-CM

## 2021-06-10 DIAGNOSIS — D62 Acute posthemorrhagic anemia: Secondary | ICD-10-CM | POA: Diagnosis not present

## 2021-06-10 DIAGNOSIS — G629 Polyneuropathy, unspecified: Secondary | ICD-10-CM

## 2021-06-10 DIAGNOSIS — D649 Anemia, unspecified: Secondary | ICD-10-CM | POA: Diagnosis not present

## 2021-06-10 DIAGNOSIS — D500 Iron deficiency anemia secondary to blood loss (chronic): Secondary | ICD-10-CM

## 2021-06-10 DIAGNOSIS — K219 Gastro-esophageal reflux disease without esophagitis: Secondary | ICD-10-CM

## 2021-06-10 DIAGNOSIS — F1010 Alcohol abuse, uncomplicated: Secondary | ICD-10-CM | POA: Diagnosis not present

## 2021-06-10 LAB — CBC
HCT: 27.7 % — ABNORMAL LOW (ref 36.0–46.0)
Hemoglobin: 8.7 g/dL — ABNORMAL LOW (ref 12.0–15.0)
MCH: 26.5 pg (ref 26.0–34.0)
MCHC: 31.4 g/dL (ref 30.0–36.0)
MCV: 84.5 fL (ref 80.0–100.0)
Platelets: 209 10*3/uL (ref 150–400)
RBC: 3.28 MIL/uL — ABNORMAL LOW (ref 3.87–5.11)
RDW: 18.3 % — ABNORMAL HIGH (ref 11.5–15.5)
WBC: 7.6 10*3/uL (ref 4.0–10.5)
nRBC: 0 % (ref 0.0–0.2)

## 2021-06-10 MED ORDER — GABAPENTIN 300 MG PO CAPS: 300.0000 mg | ORAL_CAPSULE | Freq: Three times a day (TID) | ORAL | 2 refills | Status: DC | PRN

## 2021-06-10 MED ORDER — PANTOPRAZOLE SODIUM 40 MG PO TBEC: 40.0000 mg | DELAYED_RELEASE_TABLET | Freq: Every day | ORAL | 0 refills | Status: DC

## 2021-06-10 NOTE — Telephone Encounter (Signed)
-----   Message from Earlie Server, MD sent at 06/10/2021  4:19 PM EDT ----- Regarding: FW: [facility] patient Yes. Please add lab encounter and order cbc thanks.   ----- Message ----- From: Lin Landsman, MD Sent: 06/10/2021  12:55 PM EDT To: [name], CMA, Earlie Server, MD, # Subject: [facility] patient                                 Dr. Tasia Catchings  This patient will be discharged home today.  Could you please make sure she gets CBC done on 6/1 since she has an appointment with oncology on the day.  She said she is receiving iron infusion on 6/1   Jennifer [name]  Patient needs hospital follow-up with Dr. [name] in 2 to 3 weeks  Thanks RV

## 2021-06-10 NOTE — Discharge Summary (Signed)
Physician Discharge Summary  Patient: Jennifer [name] FUX:[id number] DOB: [birth year]   Code Status: Prior Admit date: 06/06/2021 Discharge date: 06/10/2021 Disposition: Home, No home health services recommended PCP: [name], FNP  Recommendations for Outpatient Follow-up:  Follow up with PCP within 1-2 weeks CBC by first provider follow up appointment Follow up with heme/oncology Follow up with GI Capsule study result review with patient  Discharge Diagnoses:  Principal Problem:   Upper GI bleed Active Problems:   Obesity, unspecified   Hyperlipidemia   Essential hypertension   GERD (gastroesophageal reflux disease)   Hypothyroidism   Depression   History of sleeve gastrectomy   Hypercholesterolemia   Morbid obesity ([place])   Obstructive sleep apnea   Symptomatic anemia   GI bleed   Anxiety and depression   Acute blood loss anemia   IDA (iron deficiency anemia)   Alcohol abuse  Brief Hospital Course Summary: Jennifer [name] is a 71 y.o. female with a PMH significant for EtOH dependence, neuropathy, hypothyroid, depression, GERD, anxiety, morbid obesity.   They presented from home to the ED on 06/06/2021 with rectal bleeding x 3 days. She also had associated N/V, and SOB.   In the ED, it was found that they had stable vital signs.  Significant findings included hgb 5, platelets 193, WBCs 9.1, sodium 126, potassium 3.8. CT abdomen and pelvis negative for evidence of localized GI bleed or perforation.  Evidence of prior gastric bypass and no other acute findings.   They were initially treated with 2 units PRBCs, IV Protonix bolus, ondansetron IV. GI was consulted for further evaluation.   Patient was admitted to medicine service for further workup and management of GI bleed as outlined in detail below.   5/22-video capsule endoscopy-positive for distal small bowel/proximal colon fresh blood.  Poor study given bowel contents obscuring 5/23-RBC scan negative  for signs of acute bleed 5/24-repeat video capsule endoscopy following bowel prep.   Discharge home 5/25- Results were not able to be reviewed by GI due to electrical error so recommended outpatient follow up of results. Patient denied any further episodes of bleeding, was not symptomatic, hgb was stable, and she wanted to go home. Hgb 8.7 on day of discharge. Patient was tolerating normal diet.   Discharge Condition: Stable, improved Recommended discharge diet: Regular healthy diet  Consultations: GI Heme/onc  Procedures/Studies: Video capsule study x2 RBC scan   Allergies as of 06/10/2021       Reactions   Celecoxib Hives   Hydrocodone Nausea Only   Pollen Extract    Sodium Ferric Gluconate [ferrous Gluconate]    IV ferric gluconate - shortly after the infusion developed back pain shooting into left arm and bilateral hand swelling   Nasacort [triamcinolone] Other (See Comments)   Nasal - Nose Bleeds   Vicodin [hydrocodone-acetaminophen] Nausea Only        Medication List     STOP taking these medications    famotidine 20 MG tablet Commonly known as: PEPCID   triamterene-hydrochlorothiazide 37.5-25 MG capsule Commonly known as: Dyazide   VITAMIN C PO       TAKE these medications    acetaminophen 325 MG tablet Commonly known as: TYLENOL Take 2 tablets (650 mg total) by mouth every 6 (six) hours as needed for mild pain (or Fever >/= 101).   acyclovir 400 MG tablet Commonly known as: ZOVIRAX TAKE 1 TABLET BY MOUTH EVERY DAY What changed: when to take this   calcium citrate 950 (200 Ca)  MG tablet Commonly known as: CALCITRATE - dosed in mg elemental calcium Take 200 mg of elemental calcium by mouth daily.   fexofenadine 180 MG tablet Commonly known as: Allegra Allergy Take 1 tablet (180 mg total) by mouth daily.   fluticasone 50 MCG/ACT nasal spray Commonly known as: FLONASE Place 2 sprays into both nostrils daily.   gabapentin 300 MG  capsule Commonly known as: NEURONTIN Take 1 capsule (300 mg total) by mouth 3 (three) times daily as needed. What changed:  when to take this reasons to take this   hydrocortisone 25 MG suppository Commonly known as: ANUSOL-HC Place 1 suppository (25 mg total) rectally 2 (two) times daily.   ketoconazole 2 % cream Commonly known as: NIZORAL Apply 1 application topically daily. Apply on bottom of foot and thin layer between toes   levothyroxine 50 MCG tablet Commonly known as: SYNTHROID TAKE 1 TABLET BY MOUTH EVERY DAY What changed: when to take this   losartan 100 MG tablet Commonly known as: COZAAR TAKE 1 TABLET BY MOUTH EVERY DAY AT BEDTIME What changed: when to take this   multivitamin with minerals Tabs tablet Take 1 tablet by mouth daily.   pantoprazole 40 MG tablet Commonly known as: Protonix Take 1 tablet (40 mg total) by mouth daily.   sertraline 50 MG tablet Commonly known as: ZOLOFT Take 50 mg by mouth at bedtime.   sodium chloride 0.65 % Soln nasal spray Commonly known as: OCEAN Place 2 sprays into both nostrils as needed for congestion.         Subjective   Pt reports feeling well overall. Denies DOE, CP, weakness, orthotic symptoms.  She denies any further episodes of bleeding. Able to tolerate normal diet.  She would like to go home and already has f/u appointment with hematology.  Objective  Blood pressure 107/67, pulse (!) 56, temperature 98.3 F (36.8 C), resp. rate 18, height '5\' 1"'$  (1.549 m), weight 82.2 kg, SpO2 100 %.   General: Pt is alert, awake, not in acute distress Cardiovascular: RRR, S1/S2 +, no rubs, no gallops Respiratory: CTA bilaterally, no wheezing, no rhonchi Abdominal: Soft, NT, ND, bowel sounds + Extremities: no edema, no cyanosis   The results of significant diagnostics from this hospitalization (including imaging, microbiology, ancillary and laboratory) are listed below for reference.   Imaging studies: CT HEAD WO  CONTRAST (5MM)  Result Date: 06/06/2021 CLINICAL DATA:  71 year old female with syncope. EXAM: CT HEAD WITHOUT CONTRAST TECHNIQUE: Contiguous axial images were obtained from the base of the skull through the vertex without intravenous contrast. RADIATION DOSE REDUCTION: This exam was performed according to the departmental dose-optimization program which includes automated exposure control, adjustment of the mA and/or kV according to patient size and/or use of iterative reconstruction technique. COMPARISON:  03/10/2020 CT and 05/14/2021 brain MR FINDINGS: Brain: No evidence of acute infarction, hemorrhage, hydrocephalus, extra-axial collection or mass lesion/mass effect. Mild atrophy again noted. Vascular: Carotid atherosclerotic calcifications are noted. Skull: Normal. Negative for fracture or focal lesion. Sinuses/Orbits: No acute finding. Other: None. IMPRESSION: 1. No evidence of acute intracranial abnormality. 2. Mild atrophy. Electronically Signed   By: Margarette Canada M.D.   On: 06/06/2021 19:24   MR BRAIN W WO CONTRAST  Result Date: 05/16/2021 CLINICAL DATA:  Pituitary lesion follow-up. Brain/CNS neoplasm, monitor. EXAM: MRI HEAD WITHOUT AND WITH CONTRAST TECHNIQUE: Multiplanar, multiecho pulse sequences of the brain and surrounding structures were obtained without and with intravenous contrast. CONTRAST:  40m MULTIHANCE GADOBENATE DIMEGLUMINE 529 MG/ML  IV SOLN COMPARISON:  10/05/2020 FINDINGS: Brain: No acute infarct, mass effect or extra-axial collection. No acute or chronic hemorrhage. Normal white matter signal. Generalized volume loss without a clear lobar predilection. Pituitary/Sella: Unchanged appearance of 3 mm hypoenhancing focus in the left pituitary gland. A normal posterior pituitary bright spot is seen. The infundibulum is midline. The hypothalamus and mamillary bodies are normal. There is no mass effect on the optic chiasm or optic nerves. The infundibular and chiasmatic recesses are clear.  Normal cavernous sinus and cavernous internal carotid artery flow voids. Vascular: Major flow voids are preserved. Skull and upper cervical spine: Normal calvarium and skull base. Visualized upper cervical spine and soft tissues are normal. Sinuses/Orbits:No paranasal sinus fluid levels or advanced mucosal thickening. No mastoid or middle ear effusion. Normal orbits. IMPRESSION: 1. Unchanged appearance of 3 mm hypoenhancing focus in the left pituitary gland, which may represent a microadenoma. 2. Otherwise normal MRI of the brain for age. Electronically Signed   By: Ulyses Jarred M.D.   On: 05/16/2021 18:03   NM GI Blood Loss  Result Date: 06/08/2021 CLINICAL DATA:  Dark red blood in stool on Friday 06/04/2021. Hemoglobin down from 9.8 to 7. History of gastric sleeve, duodenal switch. EXAM: NUCLEAR MEDICINE GASTROINTESTINAL BLEEDING SCAN TECHNIQUE: Sequential abdominal images were obtained following intravenous administration of Tc-43mlabeled red blood cells. RADIOPHARMACEUTICALS:  22.74 mCi Tc-98mertechnetate in-vitro labeled red cells. COMPARISON:  None Available. FINDINGS: No abnormal focus of activity is demonstrated in the bowel to suggest a lower GI bleed. Normal physiologic biodistribution of the radiotracer is demonstrated throughout the abdomen. IMPRESSION: No evidence of acute GI bleed. Electronically Signed   By: ImKeane Police.O.   On: 06/08/2021 16:57   CT Angio Abd/Pel W and/or Wo Contrast  Result Date: 06/06/2021 CLINICAL DATA:  GI bleed, lower BRB per rectum EXAM: CTA ABDOMEN AND PELVIS WITHOUT AND WITH CONTRAST TECHNIQUE: Multidetector CT imaging of the abdomen and pelvis was performed using the standard protocol during bolus administration of intravenous contrast. Multiplanar reconstructed images and MIPs were obtained and reviewed to evaluate the vascular anatomy. RADIATION DOSE REDUCTION: This exam was performed according to the departmental dose-optimization program which includes  automated exposure control, adjustment of the mA and/or kV according to patient size and/or use of iterative reconstruction technique. CONTRAST:  10014mMNIPAQUE IOHEXOL 350 MG/ML SOLN COMPARISON:  06/09/2020 FINDINGS: VASCULAR Aorta: Aortic atherosclerosis.  No aneurysm or dissection. Celiac: Patent without evidence of aneurysm, dissection, vasculitis or significant stenosis. SMA: Patent without evidence of aneurysm, dissection, vasculitis or significant stenosis. Renals: Both renal arteries are patent without evidence of aneurysm, dissection, vasculitis, fibromuscular dysplasia or significant stenosis. IMA: Patent without evidence of aneurysm, dissection, vasculitis or significant stenosis. Inflow: Atherosclerosis.  No aneurysm or dissection. Proximal Outflow: Atherosclerosis.  No aneurysm or dissection. Veins: No obvious venous abnormality within the limitations of this arterial phase study. Review of the MIP images confirms the above findings. NON-VASCULAR Lower chest: In no acute abnormality Hepatobiliary: No focal liver abnormality is seen. Status post cholecystectomy. No biliary dilatation. Pancreas: No focal abnormality or ductal dilatation. Spleen: No focal abnormality.  Normal size. Adrenals/Urinary Tract: No adrenal abnormality. No focal renal abnormality. No stones or hydronephrosis. Urinary bladder is unremarkable. Stomach/Bowel: Postoperative changes from prior gastric bypass. No contrast extravasation to localize GI bleed. No bowel obstruction. Lymphatic: No adenopathy Reproductive: Prior hysterectomy.  No adnexal masses. Other: No free fluid or free air. Musculoskeletal: No acute bony abnormality. IMPRESSION: VASCULAR No contrast extravasation seen  to localize GI bleed. NON-VASCULAR Prior gastric bypass. No acute findings in the abdomen or pelvis. Electronically Signed   By: Rolm Baptise M.D.   On: 06/06/2021 19:45    Labs: Basic Metabolic Panel: Recent Labs  Lab 06/06/21 1736 06/07/21 0614  06/08/21 0548 06/09/21 0422  NA 126* 138 137 135  K 3.8 4.2 4.4 3.0*  CL 93* 103 103 100  CO2 '25 28 27 26  '$ GLUCOSE 131* 109* 105* 114*  BUN '12 14 13 '$ 6*  CREATININE 0.68 0.76 0.84 0.76  CALCIUM 8.3* 8.8* 8.8* 9.0   CBC: Recent Labs  Lab 06/06/21 1736 06/07/21 0057 06/07/21 0614 06/08/21 0548 06/08/21 1852 06/09/21 0422 06/10/21 0437  WBC 9.1  --  6.8 7.6  --  10.4 7.6  HGB 5.0*   < > 9.0* 8.7* 10.0* 9.8* 8.7*  HCT 17.1*   < > 27.5* 27.7* 32.0* 30.7* 27.7*  MCV 80.7  --  82.8 82.7  --  82.5 84.5  PLT 193  --  149* 179  --  220 209   < > = values in this interval not displayed.   Microbiology: Results for orders placed or performed during the hospital encounter of 02/05/21  Resp Panel by RT-PCR (Flu A&B, Covid) Nasopharyngeal Swab     Status: None   Collection Time: 02/05/21  2:55 PM   Specimen: Nasopharyngeal Swab; Nasopharyngeal(NP) swabs in vial transport medium  Result Value Ref Range Status   SARS Coronavirus 2 by RT PCR NEGATIVE NEGATIVE Final    Comment: (NOTE) SARS-CoV-2 target nucleic acids are NOT DETECTED.  The SARS-CoV-2 RNA is generally detectable in upper respiratory specimens during the acute phase of infection. The lowest concentration of SARS-CoV-2 viral copies this assay can detect is 138 copies/mL. A negative result does not preclude SARS-Cov-2 infection and should not be used as the sole basis for treatment or other patient management decisions. A negative result may occur with  improper specimen collection/handling, submission of specimen other than nasopharyngeal swab, presence of viral mutation(s) within the areas targeted by this assay, and inadequate number of viral copies(<138 copies/mL). A negative result must be combined with clinical observations, patient history, and epidemiological information. The expected result is Negative.  Fact Sheet for Patients:  EntrepreneurPulse.com.au  Fact Sheet for Healthcare Providers:   IncredibleEmployment.be  This test is no t yet approved or cleared by the [place] FDA and  has been authorized for detection and/or diagnosis of SARS-CoV-2 by FDA under an Emergency Use Authorization (EUA). This EUA will remain  in effect (meaning this test can be used) for the duration of the COVID-19 declaration under Section 564(b)(1) of the Act, 21 U.S.C.section 360bbb-3(b)(1), unless the authorization is terminated  or revoked sooner.       Influenza A by PCR NEGATIVE NEGATIVE Final   Influenza B by PCR NEGATIVE NEGATIVE Final    Comment: (NOTE) The Xpert Xpress SARS-CoV-2/FLU/RSV plus assay is intended as an aid in the diagnosis of influenza from Nasopharyngeal swab specimens and should not be used as a sole basis for treatment. Nasal washings and aspirates are unacceptable for Xpert Xpress SARS-CoV-2/FLU/RSV testing.  Fact Sheet for Patients: EntrepreneurPulse.com.au  Fact Sheet for Healthcare Providers: IncredibleEmployment.be  This test is not yet approved or cleared by the [place] FDA and has been authorized for detection and/or diagnosis of SARS-CoV-2 by FDA under an Emergency Use Authorization (EUA). This EUA will remain in effect (meaning this test can be used) for the duration of the  COVID-19 declaration under Section 564(b)(1) of the Act, 21 U.S.C. section 360bbb-3(b)(1), unless the authorization is terminated or revoked.  Performed at [facility], [address] [place], [place] [address]     Time coordinating discharge: Over 30 minutes  Richarda Osmond, MD  [facility] 06/12/2021, 2:09 PM

## 2021-06-10 NOTE — Care Management Important Message (Signed)
Important Message  Patient Details  Name: Jennifer [name] MRN: [id number] Date of Birth: [birth year]   Medicare Important Message Given:  Yes     Juliann Pulse A Taevon Aschoff 06/10/2021, 10:24 AM

## 2021-06-10 NOTE — TOC Transition Note (Signed)
Transition of Care [facility]) - CM/SW Discharge Note   Patient Details  Name: Jennifer [name] MRN: [id number] Date of Birth: 02/18/1950  [facility] CM/SW Contact:  Candie Chroman, LCSW Phone Number: 06/10/2021, 12:35 PM   Clinical Narrative:   Patient has orders to discharge home today. No further concerns. CSW signing off.  Final next level of care: Home/Self Care Barriers to Discharge: Barriers Resolved   Patient Goals and CMS Choice        Discharge Placement                    Patient and family notified of of transfer: 06/10/21  Discharge Plan and Services     Post Acute Care Choice: NA                               Social Determinants of Health (SDOH) Interventions     Readmission Risk Interventions     View : No data to display.

## 2021-06-10 NOTE — Progress Notes (Incomplete)
PROGRESS NOTE  Jennifer [name]    DOB: [birth year], 71 y.o.  OEV:[id number]    Code Status: Full Code   DOA: 06/06/2021   LOS: 4   Brief hospital course  Jennifer [name] is a 71 y.o. female with a PMH significant for EtOH dependence, neuropathy, hypothyroid, depression, GERD, anxiety, morbid obesity.  They presented from home to the ED on 06/06/2021 with rectal bleeding x 3 days. She also had associated N/V, and SOB.  In the ED, it was found that they had stable vital signs.  Significant findings included hgb 5, platelets 193, WBCs 9.1, sodium 126, potassium 3.8. CT abdomen and pelvis negative for evidence of localized GI bleed or perforation.  Evidence of prior gastric bypass and no other acute findings.  They were initially treated with 2 units PRBCs, IV Protonix bolus, ondansetron IV. GI was consulted for further evaluation.  Patient was admitted to medicine service for further workup and management of GI bleed as outlined in detail below.  5/22-video capsule endoscopy-positive for distal small bowel/proximal colon fresh blood.  Poor study given bowel contents obscuring 5/23-RBC scan negative for signs of acute bleed 5/24-repeat video capsule endoscopy following bowel prep  06/10/21 -stable, improved  Assessment & Plan  Principal Problem:   Upper GI bleed Active Problems:   Obesity, unspecified   Hyperlipidemia   Essential hypertension   GERD (gastroesophageal reflux disease)   Hypothyroidism   Depression   History of sleeve gastrectomy   Hypercholesterolemia   Morbid obesity ([facility])   Obstructive sleep apnea   Symptomatic anemia   GI bleed   Anxiety and depression   Acute blood loss anemia   IDA (iron deficiency anemia)   Alcohol abuse  Acute anemia  upper GI bleed- hgb 10.0>9.8 today.  Received 3 units PRBCs this admission total.  Transfusion threshold is less than 7 - GI consult, appreciate recommendations  -Repeat video capsule study this a.m.  -Clear liquid  diet  -Continue IV PPI -CBC a.m.   EtOH use- CIWA scores have been 0 for over 24 hours -Continue CIWA checks per shift -Discontinue as needed Ativan as it has not been given or needed   Depression- chronic, stable - Sertraline 50 mg nightly resumed   Hypothyroidism-chronic - Levothyroxine 50 mcg daily resumed   Essential hypertension-well-controlled.  Holding home triamterene/hydrochlorothiazide due to normotensive - Losartan 100 mg daily   Obesity, unspecified This meets criteria for morbid obesity based on the presence of 1 or more chronic comorbidities (hypertension).  Patient has 33.4 BMI. This complicates overall care and prognosis.   Body mass index is 34.24 kg/m.  VTE ppx: Place and maintain sequential compression device Start: 06/07/21 1628 Place TED hose Start: 06/06/21 2008   Diet:     Diet   Diet clear liquid Room service appropriate? Yes; Fluid consistency: Thin   Consultants: GI Subjective 06/10/21    Pt reports feels overall well today.  She denies any more bleeding, nausea, vomiting.  She is anxiously waiting to go home.  Able to tolerate liquid diet today and wants to advance.   Objective   Vitals:   06/09/21 0803 06/09/21 1651 06/09/21 2056 06/10/21 0510  BP: 120/61 125/78 119/64 107/67  Pulse: 69 69 61 (!) 56  Resp: '19 18 20 18  '$ Temp: 98.3 F (36.8 C) 99.1 F (37.3 C) 98 F (36.7 C) 98.3 F (36.8 C)  TempSrc:      SpO2: 97% 100% 100% 100%  Weight:      Height:  Intake/Output Summary (Last 24 hours) at 06/10/2021 0735 Last data filed at 06/09/2021 1850 Gross per 24 hour  Intake 1972.37 ml  Output --  Net 1972.37 ml    Filed Weights   06/06/21 1734 06/06/21 2130  Weight: 80.3 kg 82.2 kg     Physical Exam:  General: awake, alert, NAD HEENT: atraumatic, clear conjunctiva, anicteric sclera, MMM, hearing grossly normal Respiratory: normal respiratory effort. Cardiovascular: quick capillary refill Gastrointestinal: soft, NT,  ND Nervous: A&O x3. no gross focal neurologic deficits, normal speech Extremities: moves all equally, no edema, normal tone Skin: dry, intact, normal temperature, normal color. No rashes, lesions or ulcers on exposed skin Psychiatry: normal mood, congruent affect  Labs   I have personally reviewed the following labs and imaging studies CBC    Component Value Date/Time   WBC 7.6 06/10/2021 0437   RBC 3.28 (L) 06/10/2021 0437   HGB 8.7 (L) 06/10/2021 0437   HGB 10.3 (L) 12/21/2020 1356   HCT 27.7 (L) 06/10/2021 0437   HCT 32.8 (L) 12/21/2020 1356   PLT 209 06/10/2021 0437   PLT 236 12/21/2020 1356   MCV 84.5 06/10/2021 0437   MCV 76 (L) 12/21/2020 1356   MCV 84 06/26/2013 0409   MCH 26.5 06/10/2021 0437   MCHC 31.4 06/10/2021 0437   RDW 18.3 (H) 06/10/2021 0437   RDW 19.6 (H) 12/21/2020 1356   RDW 14.5 06/26/2013 0409   LYMPHSABS 2.5 05/11/2021 1026   LYMPHSABS 1.9 10/14/2020 1038   LYMPHSABS 1.5 06/26/2013 0409   MONOABS 0.6 05/11/2021 1026   MONOABS 0.7 06/26/2013 0409   EOSABS 0.0 05/11/2021 1026   EOSABS 0.0 10/14/2020 1038   EOSABS 0.0 06/26/2013 0409   BASOSABS 0.1 05/11/2021 1026   BASOSABS 0.1 10/14/2020 1038   BASOSABS 0.0 06/26/2013 0409      Latest Ref Rng & Units 06/09/2021    4:22 AM 06/08/2021    5:48 AM 06/07/2021    6:14 AM  BMP  Glucose 70 - 99 mg/dL 114   105   109    BUN 8 - 23 mg/dL '6   13   14    '$ Creatinine 0.44 - 1.00 mg/dL 0.76   0.84   0.76    Sodium 135 - 145 mmol/L 135   137   138    Potassium 3.5 - 5.1 mmol/L 3.0   4.4   4.2    Chloride 98 - 111 mmol/L 100   103   103    CO2 22 - 32 mmol/L '26   27   28    '$ Calcium 8.9 - 10.3 mg/dL 9.0   8.8   8.8      NM GI Blood Loss  Result Date: 06/08/2021 CLINICAL DATA:  Dark red blood in stool on Friday 06/04/2021. Hemoglobin down from 9.8 to 7. History of gastric sleeve, duodenal switch. EXAM: NUCLEAR MEDICINE GASTROINTESTINAL BLEEDING SCAN TECHNIQUE: Sequential abdominal images were obtained  following intravenous administration of Tc-54mlabeled red blood cells. RADIOPHARMACEUTICALS:  22.74 mCi Tc-970mertechnetate in-vitro labeled red cells. COMPARISON:  None Available. FINDINGS: No abnormal focus of activity is demonstrated in the bowel to suggest a lower GI bleed. Normal physiologic biodistribution of the radiotracer is demonstrated throughout the abdomen. IMPRESSION: No evidence of acute GI bleed. Electronically Signed   By: ImKeane Police.O.   On: 06/08/2021 16:57    Disposition Plan & Communication  Patient status: Inpatient  Admitted From: Home Planned disposition location: Home Anticipated discharge date:  5/25 pending GI signing off and stable hemoglobin  Family Communication: None   Author: Richarda Osmond, DO [facility] 06/10/2021, 7:35 AM   Available by Epic secure chat 7AM-7PM. If 7PM-7AM, please contact night-coverage.  [facility] contact information found on [email or web address].

## 2021-06-10 NOTE — Telephone Encounter (Signed)
Per Dr. Verlin Grills request. Pt will need cbc on 6/1.   Please add lab encounter to 6/1 appt, prior to getting infusion and inform pt of appt update.

## 2021-06-10 NOTE — Progress Notes (Signed)
Jennifer Darby, MD [address]  [place]  [place], [place] [address]  Main: [phone]  Fax: [phone] Pager: [phone]   Subjective: No acute events overnight.  Patient reports having 2 brown/yellow bowel movements this morning.  She underwent video capsule endoscopy yesterday.  She does not have any GI symptoms today.  She wants to know if she can go home today   Objective: Vital signs in last 24 hours: Vitals:   06/09/21 1651 06/09/21 2056 06/10/21 0510 06/10/21 0748  BP: 125/78 119/64 107/67 113/72  Pulse: 69 61 (!) 56 65  Resp: '18 20 18 18  '$ Temp: 99.1 F (37.3 C) 98 F (36.7 C) 98.3 F (36.8 C) 98.1 F (36.7 C)  TempSrc:      SpO2: 100% 100% 100% 100%  Weight:      Height:       Weight change:   Intake/Output Summary (Last 24 hours) at 06/10/2021 1251 Last data filed at 06/10/2021 1017 Gross per 24 hour  Intake 1801.66 ml  Output --  Net 1801.66 ml    Exam: Heart:: Regular rate and rhythm, S1S2 present, or without murmur or extra heart sounds Lungs: normal and clear to auscultation Abdomen: soft, nontender, normal bowel sounds   Lab Results:    Latest Ref Rng & Units 06/10/2021    4:37 AM 06/09/2021    4:22 AM 06/08/2021    6:52 PM  CBC  WBC 4.0 - 10.5 K/uL 7.6   10.4     Hemoglobin 12.0 - 15.0 g/dL 8.7   9.8   10.0    Hematocrit 36.0 - 46.0 % 27.7   30.7   32.0    Platelets 150 - 400 K/uL 209   220         Latest Ref Rng & Units 06/09/2021    4:22 AM 06/08/2021    5:48 AM 06/07/2021    6:14 AM  CMP  Glucose 70 - 99 mg/dL 114   105   109    BUN 8 - 23 mg/dL '6   13   14    '$ Creatinine 0.44 - 1.00 mg/dL 0.76   0.84   0.76    Sodium 135 - 145 mmol/L 135   137   138    Potassium 3.5 - 5.1 mmol/L 3.0   4.4   4.2    Chloride 98 - 111 mmol/L 100   103   103    CO2 22 - 32 mmol/L '26   27   28    '$ Calcium 8.9 - 10.3 mg/dL 9.0   8.8   8.8      Micro Results: No results found for this or any previous visit (from the past 240  hour(s)). Studies/Results: NM GI Blood Loss  Result Date: 06/08/2021 CLINICAL DATA:  Dark red blood in stool on Friday 06/04/2021. Hemoglobin down from 9.8 to 7. History of gastric sleeve, duodenal switch. EXAM: NUCLEAR MEDICINE GASTROINTESTINAL BLEEDING SCAN TECHNIQUE: Sequential abdominal images were obtained following intravenous administration of Tc-57mlabeled red blood cells. RADIOPHARMACEUTICALS:  22.74 mCi Tc-916mertechnetate in-vitro labeled red cells. COMPARISON:  None Available. FINDINGS: No abnormal focus of activity is demonstrated in the bowel to suggest a lower GI bleed. Normal physiologic biodistribution of the radiotracer is demonstrated throughout the abdomen. IMPRESSION: No evidence of acute GI bleed. Electronically Signed   By: ImKeane Police.O.   On: 06/08/2021 16:57   Medications: I have reviewed the patient's current medications. Prior  to Admission:  Medications Prior to Admission  Medication Sig Dispense Refill Last Dose   acetaminophen (TYLENOL) 325 MG tablet Take 2 tablets (650 mg total) by mouth every 6 (six) hours as needed for mild pain (or Fever >/= 101).   06/06/2021   acyclovir (ZOVIRAX) 400 MG tablet TAKE 1 TABLET BY MOUTH EVERY DAY (Patient taking differently: Take 400 mg by mouth daily at 6 (six) AM.) 30 tablet 5 06/06/2021   calcium citrate (CALCITRATE - DOSED IN MG ELEMENTAL CALCIUM) 950 (200 Ca) MG tablet Take 200 mg of elemental calcium by mouth daily.   06/06/2021   famotidine (PEPCID) 20 MG tablet TAKE 1 TABLET BY MOUTH EVERY NIGHT AT BEDTIME (Patient taking differently: Take 20 mg by mouth at bedtime.) 90 tablet 1 06/05/2021   fexofenadine (ALLEGRA ALLERGY) 180 MG tablet Take 1 tablet (180 mg total) by mouth daily. 90 tablet 1 06/06/2021   fluticasone (FLONASE) 50 MCG/ACT nasal spray Place 2 sprays into both nostrils daily. 16 g 0 06/06/2021   levothyroxine (SYNTHROID) 50 MCG tablet TAKE 1 TABLET BY MOUTH EVERY DAY (Patient taking differently: Take 50 mcg by mouth  daily before breakfast.) 90 tablet 1 06/06/2021   losartan (COZAAR) 100 MG tablet TAKE 1 TABLET BY MOUTH EVERY DAY AT BEDTIME (Patient taking differently: Take 100 mg by mouth daily.) 30 tablet 2 06/06/2021   Multiple Vitamin (MULTIVITAMIN WITH MINERALS) TABS tablet Take 1 tablet by mouth daily.   06/06/2021   sertraline (ZOLOFT) 50 MG tablet Take 50 mg by mouth at bedtime.   06/05/2021   triamterene-hydrochlorothiazide (DYAZIDE) 37.5-25 MG capsule Take 1 each (1 capsule total) by mouth daily. 90 capsule 1 06/06/2021   [DISCONTINUED] gabapentin (NEURONTIN) 300 MG capsule Take 1 capsule (300 mg total) by mouth 3 (three) times daily. 180 capsule 2 06/06/2021   Ascorbic Acid (VITAMIN C PO) Take by mouth. (Patient not taking: Reported on 06/06/2021)   Not Taking   hydrocortisone (ANUSOL-HC) 25 MG suppository Place 1 suppository (25 mg total) rectally 2 (two) times daily. 12 suppository 1 prn   ketoconazole (NIZORAL) 2 % cream Apply 1 application topically daily. Apply on bottom of foot and thin layer between toes 60 g 2 prn   sodium chloride (OCEAN) 0.65 % SOLN nasal spray Place 2 sprays into both nostrils as needed for congestion. 30 mL 2 prn   Scheduled:  acyclovir  400 mg Oral Q0600   Chlorhexidine Gluconate Cloth  6 each Topical Daily   famotidine  20 mg Oral QHS   fluticasone  1 spray Each Nare Daily   folic acid  1 mg Oral Daily   gabapentin  300 mg Oral TID   levothyroxine  50 mcg Oral Q0600   loratadine  10 mg Oral Daily   losartan  100 mg Oral Daily   multivitamin with minerals  1 tablet Oral Daily   pantoprazole  40 mg Intravenous Q12H   sertraline  50 mg Oral QHS   thiamine  100 mg Oral Daily   Or   thiamine  100 mg Intravenous Daily   Continuous:   YKD:[id number], ondansetron **OR** ondansetron (ZOFRAN) IV, simethicone Anti-infectives (From admission, onward)    Start     Dose/Rate Route Frequency Ordered Stop   06/07/21 0800  acyclovir (ZOVIRAX) 200 MG capsule 400 mg         400 mg Oral Daily 06/07/21 0658     06/07/21 0600  acyclovir (ZOVIRAX) tablet 400 mg  Status:  Discontinued  400 mg Oral Daily 06/06/21 2237 06/07/21 0657      Scheduled Meds:  acyclovir  400 mg Oral Q0600   Chlorhexidine Gluconate Cloth  6 each Topical Daily   famotidine  20 mg Oral QHS   fluticasone  1 spray Each Nare Daily   folic acid  1 mg Oral Daily   gabapentin  300 mg Oral TID   levothyroxine  50 mcg Oral Q0600   loratadine  10 mg Oral Daily   losartan  100 mg Oral Daily   multivitamin with minerals  1 tablet Oral Daily   pantoprazole  40 mg Intravenous Q12H   sertraline  50 mg Oral QHS   thiamine  100 mg Oral Daily   Or   thiamine  100 mg Intravenous Daily   Continuous Infusions:   PRN Meds:.menthol-cetylpyridinium, ondansetron **OR** ondansetron (ZOFRAN) IV, simethicone   Assessment: Principal Problem:   Upper GI bleed Active Problems:   Obesity, unspecified   Hyperlipidemia   Essential hypertension   GERD (gastroesophageal reflux disease)   Hypothyroidism   Depression   History of sleeve gastrectomy   Hypercholesterolemia   Morbid obesity (HCC)   Obstructive sleep apnea   Symptomatic anemia   GI bleed   Anxiety and depression   Acute blood loss anemia   IDA (iron deficiency anemia)   Alcohol abuse  Jennifer [name] is a 71 y.o. female with history of gastric sleeve, duodenal switch with recurrent episodes of hematochezia resulting in severe acute blood loss anemia.  Patient underwent several bidirectional endoscopy within last 3 years and no luminal source of bleeding identified.  The source of bleeding was thought to be secondary to bleeding from internal hemorrhoids, underwent outpatient hemorrhoid ligation of the left lateral and right posterior hemorrhoids.  Patient presents with recurrence of hematochezia with acute blood loss anemia and presyncope. CT angio abdomen and pelvis were negative for active extravasation.  Apparently, patient  took 2 pills of aspirin 81 mg prior to onset of hematochezia  Plan: Hematochezia, obscure GI bleed: Patient is no longer actively bleeding and hemoglobin is relatively stable.  No recurrence of hematochezia since admission Patient underwent video capsule endoscopy which revealed old blood in the distal small bowel/proximal colon and an area of fresh blood in the proximal colon.  There was large amount of liquid stool in the small intestine as well as colon, the study was suboptimal to identify the exact source Tagged RBC scan on 5/23 did not reveal active bleeding source Subsequently, patient underwent a repeat video capsule endoscopy on 5/24 after bowel prep.  Capsule machine is currently not working, so I am unable to interpret capsule study images today. Patient can go home today.  Advised patient not to take aspirin She has an appointment at [facility] cancer center on 6/1 for iron infusion.  Patient will get CBC drawn on that day prior to iron infusion, patient's oncologist, Dr. Tasia Catchings is notified Patient should follow-up with Dr. [name], primary gastroenterologist in next 2 to 3 weeks     LOS: 4 days   Jennifer [name] 06/10/2021, 12:51 PM

## 2021-06-10 NOTE — Discharge Instructions (Signed)
Please get your blood work checked within a week after discharge at a follow up appointment with one of your doctors to ensure your hemoglobin is stable and improving.  Return to the ED if having repeat bleeding or you are feeling excessively tired/short of breath.  I recommend you avoid any NSAID medications, alcohol, or tobacco as these can risk your recovery

## 2021-06-11 ENCOUNTER — Telehealth: Payer: Self-pay

## 2021-06-11 ENCOUNTER — Encounter: Payer: Self-pay | Admitting: Oncology

## 2021-06-11 NOTE — Telephone Encounter (Signed)
Transition Care Management Unsuccessful Follow-up Telephone Call  Date of discharge and from where:  06/10/21 [facility]  Attempts:  1st Attempt  Reason for unsuccessful TCM follow-up call:  No answer/busy. Left message to call office and scheduled hfu with PCP as needed

## 2021-06-15 NOTE — Progress Notes (Signed)
VCE report  Study is complete, capsule reached cecum, normal small bowel transit and normal small bowel study. No active bleeding nor bleeding source identified and no small lesions identified  Normal study Follow up with [name] in the office Consider Meckel's scan as outpt  Sherri Sear, MD

## 2021-06-15 NOTE — Telephone Encounter (Signed)
Patient plans to follow up with Oncology 06/17/21. Scheduled with GI 09/14/21. Notes blood work will be drawn with Oncology and agrees to follow up with PCP for medication management and as needed. No hospital follow up scheduled with PCP at this time per patient preference.

## 2021-06-17 ENCOUNTER — Inpatient Hospital Stay: Payer: Medicare Other

## 2021-06-17 ENCOUNTER — Other Ambulatory Visit: Payer: Self-pay | Admitting: Family

## 2021-06-17 ENCOUNTER — Inpatient Hospital Stay: Payer: Medicare Other | Attending: Oncology

## 2021-06-17 VITALS — BP 139/74 | HR 72 | Temp 97.6°F | Resp 18

## 2021-06-17 DIAGNOSIS — D500 Iron deficiency anemia secondary to blood loss (chronic): Secondary | ICD-10-CM | POA: Diagnosis not present

## 2021-06-17 DIAGNOSIS — K649 Unspecified hemorrhoids: Secondary | ICD-10-CM | POA: Insufficient documentation

## 2021-06-17 DIAGNOSIS — D62 Acute posthemorrhagic anemia: Secondary | ICD-10-CM

## 2021-06-17 LAB — CBC WITH DIFFERENTIAL/PLATELET
Abs Immature Granulocytes: 0.02 10*3/uL (ref 0.00–0.07)
Basophils Absolute: 0.1 10*3/uL (ref 0.0–0.1)
Basophils Relative: 1 %
Eosinophils Absolute: 0 10*3/uL (ref 0.0–0.5)
Eosinophils Relative: 0 %
HCT: 28.3 % — ABNORMAL LOW (ref 36.0–46.0)
Hemoglobin: 9.2 g/dL — ABNORMAL LOW (ref 12.0–15.0)
Immature Granulocytes: 0 %
Lymphocytes Relative: 24 %
Lymphs Abs: 1.8 10*3/uL (ref 0.7–4.0)
MCH: 26.4 pg (ref 26.0–34.0)
MCHC: 32.5 g/dL (ref 30.0–36.0)
MCV: 81.3 fL (ref 80.0–100.0)
Monocytes Absolute: 0.7 10*3/uL (ref 0.1–1.0)
Monocytes Relative: 9 %
Neutro Abs: 5 10*3/uL (ref 1.7–7.7)
Neutrophils Relative %: 66 %
Platelets: 326 10*3/uL (ref 150–400)
RBC: 3.48 MIL/uL — ABNORMAL LOW (ref 3.87–5.11)
RDW: 18.6 % — ABNORMAL HIGH (ref 11.5–15.5)
WBC: 7.6 10*3/uL (ref 4.0–10.5)
nRBC: 0 % (ref 0.0–0.2)

## 2021-06-17 MED ORDER — SODIUM CHLORIDE 0.9 % IV SOLN
Freq: Once | INTRAVENOUS | Status: AC
  Filled 2021-06-17: qty 250

## 2021-06-17 MED ORDER — IRON SUCROSE 20 MG/ML IV SOLN
200.0000 mg | Freq: Once | INTRAVENOUS | Status: AC
  Administered 2021-06-17: 200 mg via INTRAVENOUS
  Filled 2021-06-17: qty 10

## 2021-06-17 MED ORDER — SODIUM CHLORIDE 0.9 % IV SOLN: 200.0000 mg | Freq: Once | INTRAVENOUS | Status: DC

## 2021-06-17 NOTE — Patient Instructions (Signed)
[facility]  Discharge Instructions: ?Thank you for choosing [facility] to provide your oncology and hematology care.  ?If you have a lab appointment with the Cancer Center, please go directly to the Cancer Center and check in at the registration area. ? ?Wear comfortable clothing and clothing appropriate for easy access to any Portacath or PICC line.  ? ?We strive to give you quality time with your provider. You may need to reschedule your appointment if you arrive late (15 or more minutes).  Arriving late affects you and other patients whose appointments are after yours.  Also, if you miss three or more appointments without notifying the office, you may be dismissed from the clinic at the provider?s discretion.    ?  ?For prescription refill requests, have your pharmacy contact our office and allow 72 hours for refills to be completed.   ? ?Today you received the following chemotherapy and/or immunotherapy agents VENOFER ?    ?  ?To help prevent nausea and vomiting after your treatment, we encourage you to take your nausea medication as directed. ? ?BELOW ARE SYMPTOMS THAT SHOULD BE REPORTED IMMEDIATELY: ?*FEVER GREATER THAN 100.4 F (38 ?C) OR HIGHER ?*CHILLS OR SWEATING ?*NAUSEA AND VOMITING THAT IS NOT CONTROLLED WITH YOUR NAUSEA MEDICATION ?*UNUSUAL SHORTNESS OF BREATH ?*UNUSUAL BRUISING OR BLEEDING ?*URINARY PROBLEMS (pain or burning when urinating, or frequent urination) ?*BOWEL PROBLEMS (unusual diarrhea, constipation, pain near the anus) ?TENDERNESS IN MOUTH AND THROAT WITH OR WITHOUT PRESENCE OF ULCERS (sore throat, sores in mouth, or a toothache) ?UNUSUAL RASH, SWELLING OR PAIN  ?UNUSUAL VAGINAL DISCHARGE OR ITCHING  ? ?Items with * indicate a potential emergency and should be followed up as soon as possible or go to the Emergency Department if any problems should occur. ? ?Please show the CHEMOTHERAPY ALERT CARD or IMMUNOTHERAPY ALERT CARD at check-in to  the Emergency Department and triage nurse. ? ?Should you have questions after your visit or need to cancel or reschedule your appointment, please contact [facility]  [phone] and follow the prompts.  Office hours are 8:00 a.m. to 4:30 p.m. Monday - Friday. Please note that voicemails left after 4:00 p.m. may not be returned until the following business day.  We are closed weekends and major holidays. You have access to a nurse at all times for urgent questions. Please call the main number to the clinic [phone] and follow the prompts. ? ?For any non-urgent questions, you may also contact your provider using MyChart. We now offer e-Visits for anyone 18 and older to request care online for non-urgent symptoms. For details visit [email or web address]. ?  ?Also download the MyChart app! Go to the app store, search "MyChart", open the app, select [facility], and log in with your MyChart username and password. ? ?Due to Covid, a mask is required upon entering the hospital/clinic. If you do not have a mask, one will be given to you upon arrival. For doctor visits, patients may have 1 support person aged 18 or older with them. For treatment visits, patients cannot have anyone with them due to current Covid guidelines and our immunocompromised population.  ? ?Iron Sucrose Injection ?What is this medication? ?IRON SUCROSE (EYE ern SOO krose) treats low levels of iron (iron deficiency anemia) in people with kidney disease. Iron is a mineral that plays an important role in making red blood cells, which carry oxygen from your lungs to the rest of your body. ?This medicine   may be used for other purposes; ask your health care provider or pharmacist if you have questions. ?COMMON BRAND NAME(S): Venofer ?What should I tell my care team before I take this medication? ?They need to know if you have any of these conditions: ?Anemia not caused by low iron levels ?Heart disease ?High levels of  iron in the blood ?Kidney disease ?Liver disease ?An unusual or allergic reaction to iron, other medications, foods, dyes, or preservatives ?Pregnant or trying to get pregnant ?Breast-feeding ?How should I use this medication? ?This medication is for infusion into a vein. It is given in a hospital or clinic setting. ?Talk to your care team about the use of this medication in children. While this medication may be prescribed for children as young as 2 years for selected conditions, precautions do apply. ?Overdosage: If you think you have taken too much of this medicine contact a poison control center or emergency room at once. ?NOTE: This medicine is only for you. Do not share this medicine with others. ?What if I miss a dose? ?It is important not to miss your dose. Call your care team if you are unable to keep an appointment. ?What may interact with this medication? ?Do not take this medication with any of the following: ?Deferoxamine ?Dimercaprol ?Other iron products ?This medication may also interact with the following: ?Chloramphenicol ?Deferasirox ?This list may not describe all possible interactions. Give your health care provider a list of all the medicines, herbs, non-prescription drugs, or dietary supplements you use. Also tell them if you smoke, drink alcohol, or use illegal drugs. Some items may interact with your medicine. ?What should I watch for while using this medication? ?Visit your care team regularly. Tell your care team if your symptoms do not start to get better or if they get worse. You may need blood work done while you are taking this medication. ?You may need to follow a special diet. Talk to your care team. Foods that contain iron include: whole grains/cereals, dried fruits, beans, or peas, leafy green vegetables, and organ meats (liver, kidney). ?What side effects may I notice from receiving this medication? ?Side effects that you should report to your care team as soon as  possible: ?Allergic reactions--skin rash, itching, hives, swelling of the face, lips, tongue, or throat ?Low blood pressure--dizziness, feeling faint or lightheaded, blurry vision ?Shortness of breath ?Side effects that usually do not require medical attention (report to your care team if they continue or are bothersome): ?Flushing ?Headache ?Joint pain ?Muscle pain ?Nausea ?Pain, redness, or irritation at injection site ?This list may not describe all possible side effects. Call your doctor for medical advice about side effects. You may report side effects to FDA at 1-800-FDA-1088. ?Where should I keep my medication? ?This medication is given in a hospital or clinic and will not be stored at home. ?NOTE: This sheet is a summary. It may not cover all possible information. If you have questions about this medicine, talk to your doctor, pharmacist, or health care provider. ?? 2023 Elsevier/Gold Standard (2020-05-29 00:00:00) ? ?

## 2021-06-21 ENCOUNTER — Other Ambulatory Visit: Payer: Self-pay | Admitting: Family

## 2021-06-21 DIAGNOSIS — D649 Anemia, unspecified: Secondary | ICD-10-CM

## 2021-06-22 ENCOUNTER — Other Ambulatory Visit: Payer: Self-pay

## 2021-06-22 ENCOUNTER — Telehealth: Payer: Self-pay

## 2021-06-22 ENCOUNTER — Encounter: Payer: Self-pay | Admitting: Gastroenterology

## 2021-06-22 ENCOUNTER — Telehealth: Payer: Self-pay | Admitting: Family

## 2021-06-22 NOTE — Telephone Encounter (Signed)
Got her schedule for 06/24/2021 and then [name] will see her on 07/06/2021

## 2021-06-22 NOTE — Telephone Encounter (Signed)
Pt stated that she has spoken with Dr. Georgeann Oppenheim office & that she was to be seen 6/22. They would call if he could see her earlier. She was going to wait to see them as I offered her a more acute appointment here. I did send in suppository refill.

## 2021-06-22 NOTE — Telephone Encounter (Signed)
Bring her to the office for hemorrhoid banding this week if she is agreeable.  Otherwise, follow-up with Dr. [name] next week  RV

## 2021-06-22 NOTE — Telephone Encounter (Signed)
Pt need a referral for GI because when pt does bowel movements it bleeds bright red. This has been going on since saturday

## 2021-06-22 NOTE — Telephone Encounter (Signed)
Capsule study result: Study is complete, capsule reached cecum, Normal small bowel Transit and normal Small bowel study. No bleeding source identified and no small lesions identified. Called patient and patient verbalized understanding of results.  Patient states she has been having rectal bleeding with every bowel movement. She states it is in the stool and on the toilet paper. She has rectal burning. Is having 2 bowel movements a day. Patient is a Dr. [name] patient but you saw patient in the hospital. States she called yesterday but no one returned her call

## 2021-06-24 ENCOUNTER — Ambulatory Visit (INDEPENDENT_AMBULATORY_CARE_PROVIDER_SITE_OTHER): Payer: Medicare Other | Admitting: Gastroenterology

## 2021-06-24 ENCOUNTER — Encounter: Payer: Self-pay | Admitting: Gastroenterology

## 2021-06-24 VITALS — BP 133/71 | HR 103 | Temp 99.2°F | Ht 61.0 in | Wt 177.1 lb

## 2021-06-24 DIAGNOSIS — R001 Bradycardia, unspecified: Secondary | ICD-10-CM | POA: Diagnosis not present

## 2021-06-24 DIAGNOSIS — K625 Hemorrhage of anus and rectum: Secondary | ICD-10-CM

## 2021-06-24 DIAGNOSIS — R000 Tachycardia, unspecified: Secondary | ICD-10-CM | POA: Diagnosis not present

## 2021-06-24 DIAGNOSIS — K641 Second degree hemorrhoids: Secondary | ICD-10-CM | POA: Diagnosis not present

## 2021-06-24 DIAGNOSIS — D62 Acute posthemorrhagic anemia: Secondary | ICD-10-CM | POA: Diagnosis not present

## 2021-06-24 DIAGNOSIS — R0609 Other forms of dyspnea: Secondary | ICD-10-CM | POA: Diagnosis not present

## 2021-06-24 DIAGNOSIS — D500 Iron deficiency anemia secondary to blood loss (chronic): Secondary | ICD-10-CM

## 2021-06-24 DIAGNOSIS — G4733 Obstructive sleep apnea (adult) (pediatric): Secondary | ICD-10-CM | POA: Diagnosis not present

## 2021-06-24 DIAGNOSIS — I208 Other forms of angina pectoris: Secondary | ICD-10-CM | POA: Diagnosis not present

## 2021-06-24 DIAGNOSIS — R011 Cardiac murmur, unspecified: Secondary | ICD-10-CM | POA: Diagnosis not present

## 2021-06-24 DIAGNOSIS — E785 Hyperlipidemia, unspecified: Secondary | ICD-10-CM | POA: Diagnosis not present

## 2021-06-24 DIAGNOSIS — R0602 Shortness of breath: Secondary | ICD-10-CM | POA: Diagnosis not present

## 2021-06-24 DIAGNOSIS — I872 Venous insufficiency (chronic) (peripheral): Secondary | ICD-10-CM | POA: Diagnosis not present

## 2021-06-24 DIAGNOSIS — R002 Palpitations: Secondary | ICD-10-CM | POA: Diagnosis not present

## 2021-06-24 DIAGNOSIS — R9431 Abnormal electrocardiogram [ECG] [EKG]: Secondary | ICD-10-CM | POA: Diagnosis not present

## 2021-06-24 NOTE — Progress Notes (Signed)
Cephas Darby, MD [name]  [place]  [name], [place] [address]  Main: [phone]  Fax: [phone]    Gastroenterology Consultation  Referring Provider:     [name], FNP Primary Care Physician:  [name], FNP Primary Gastroenterologist:  Dr. [name] Reason for Consultation: Chronic iron deficiency anemia, rectal bleeding        HPI:   Jennifer [name] is a 71 y.o. female referred by Dr. [name], FNP  for consultation & management of chronic iron deficiency anemia secondary to blood loss from intermittent rectal bleeding from internal hemorrhoids.  Patient underwent ligation of left lateral and right posterior hemorrhoids by Dr. [name] in January as an February 2023.  Patient was recently admitted at [facility] secondary to severe acute blood loss anemia from rectal bleeding on 06/06/2021.  Her hemoglobin dropped from 10.1 on 05/11/21 to 5 on 06/06/2021,day of admission.  She received blood transfusions and responded appropriately.  She underwent CT angio bleeding protocol which was negative.  Also underwent nuclear medicine bleeding scan which was negative.  Underwent video capsule endoscopy as inpatient itself which was unremarkable.  Her hemoglobin on 6/1 was 9.2.  Patient received iron infusion last week  Patient had several episodes of hematochezia in the past.  She underwent EGD and colonoscopy in 11/2020 with no source of bleeding identified other than internal hemorrhoids.  She had upper endoscopy and colonoscopy in 11/2019 as well with no other source identified.  She had recurrence of hematochezia in January 2023, flexible sigmoidoscopy revealed brown stool and hemorrhoids only.  Subsequently, underwent outpatient hemorrhoid ligation by Dr. [name] of the left lateral and right posterior hemorrhoids.  Recurrence of hematochezia resulting in severe acute blood loss anemia and hospitalization on 06/06/2021  We made an urgent visit to see her today  because she stated that she has been having rectal bleeding with every bowel movement, on surface of the stool as well as in the toilet paper and her rectum is burning.  She reports having 2-3 formed bowel movements daily.  Patient denies any pushing or straining.  Spends not more than 5 minutes on the toilet.  NSAIDs: None  Antiplts/Anticoagulants/Anti thrombotics: None  GI Procedures:  10/01/2019: Colonoscopy: Prior history of adenomatous polyps.  A 4 mm polyp noted in the ascending colon.  Resected.  On retroflexion internal hemorrhoids seen on pictures.  The polyp was a tubular adenoma. 11/19/2020: EGD for dysphagia :Post surgical changes but no strictures. Colonoscopy was normal except for hemorrhoids but prep was poor, brown stool Flexible sigmoidoscopy for hematochezia 02/06/2021, brown stool and hemorrhoids only Video capsule endoscopy 06/10/2021 Study is complete, capsule reached cecum, normal small bowel transit and normal small bowel study. No active bleeding nor bleeding source identified and no small lesions identified    Past Medical History:  Diagnosis Date   Anemia    Anxiety    Bariatric surgery status    Constipation    COVID-19    Dysrhythmia    Elevated liver enzymes    GERD (gastroesophageal reflux disease)    Hemorrhoids    Herpes genitalis    High cholesterol    Hyperlipidemia    Hypertension    Hypothyroidism    IDA (iron deficiency anemia) 02/09/2021   Neuropathy    Osteoarthritis    Sleep apnea     Past Surgical History:  Procedure Laterality Date   ABDOMINAL HYSTERECTOMY     total for fibroids no h/o abnormal pap  bariatric sleeve  2015   BREAST EXCISIONAL BIOPSY Left 1998   carpal tunnel repair     COLONOSCOPY WITH PROPOFOL N/A 02/11/2016   Procedure: COLONOSCOPY WITH PROPOFOL;  Surgeon: Jonathon Bellows, MD;  Location: [facility] ENDOSCOPY;  Service: Endoscopy;  Laterality: N/A;   COLONOSCOPY WITH PROPOFOL N/A 10/01/2019   Procedure: COLONOSCOPY WITH  PROPOFOL;  Surgeon: Jonathon Bellows, MD;  Location: [facility] ENDOSCOPY;  Service: Gastroenterology;  Laterality: N/A;   COLONOSCOPY WITH PROPOFOL N/A 11/19/2020   Procedure: COLONOSCOPY WITH PROPOFOL;  Surgeon: Jonathon Bellows, MD;  Location: [facility] ENDOSCOPY;  Service: Gastroenterology;  Laterality: N/A;   ESOPHAGOGASTRODUODENOSCOPY (EGD) WITH PROPOFOL N/A 12/02/2019   Procedure: ESOPHAGOGASTRODUODENOSCOPY (EGD) WITH PROPOFOL;  Surgeon: Jonathon Bellows, MD;  Location: [facility] ENDOSCOPY;  Service: Gastroenterology;  Laterality: N/A;   ESOPHAGOGASTRODUODENOSCOPY (EGD) WITH PROPOFOL N/A 11/19/2020   Procedure: ESOPHAGOGASTRODUODENOSCOPY (EGD) WITH PROPOFOL;  Surgeon: Jonathon Bellows, MD;  Location: [facility] ENDOSCOPY;  Service: Gastroenterology;  Laterality: N/A;   FLEXIBLE SIGMOIDOSCOPY N/A 02/06/2021   Procedure: FLEXIBLE SIGMOIDOSCOPY;  Surgeon: [name], DO;  Location: [facility] ENDOSCOPY;  Service: Gastroenterology;  Laterality: N/A;   GIVENS CAPSULE STUDY N/A 06/07/2021   Procedure: GIVENS CAPSULE STUDY;  Surgeon: Lin Landsman, MD;  Location: [facility] ENDOSCOPY;  Service: Gastroenterology;  Laterality: N/A;   GIVENS CAPSULE STUDY N/A 06/09/2021   Procedure: GIVENS CAPSULE STUDY;  Surgeon: Lin Landsman, MD;  Location: [facility] ENDOSCOPY;  Service: Gastroenterology;  Laterality: N/A;   HEMORRHOID SURGERY     LAPAROSCOPIC GASTRIC RESTRICTIVE DUODENAL PROCEDURE (DUODENAL SWITCH) Bilateral    2020   Current Outpatient Medications:    acetaminophen (TYLENOL) 325 MG tablet, Take 2 tablets (650 mg total) by mouth every 6 (six) hours as needed for mild pain (or Fever >/= 101)., Disp: , Rfl:    acyclovir (ZOVIRAX) 400 MG tablet, TAKE 1 TABLET BY MOUTH EVERY DAY (Patient taking differently: Take 400 mg by mouth daily at 6 (six) AM.), Disp: 30 tablet, Rfl: 5   ANUCORT-HC 25 MG suppository, UNWRAP AND INSERT 1 SUPPOSITORY RECTALLY TWICE A DAY, Disp: 12 suppository, Rfl: 1   calcium citrate (CALCITRATE - DOSED IN MG  ELEMENTAL CALCIUM) 950 (200 Ca) MG tablet, Take 200 mg of elemental calcium by mouth daily., Disp: , Rfl:    fexofenadine (ALLEGRA ALLERGY) 180 MG tablet, Take 1 tablet (180 mg total) by mouth daily., Disp: 90 tablet, Rfl: 1   fluticasone (FLONASE) 50 MCG/ACT nasal spray, Place 2 sprays into both nostrils daily., Disp: 16 g, Rfl: 0   gabapentin (NEURONTIN) 300 MG capsule, Take 1 capsule (300 mg total) by mouth 3 (three) times daily as needed., Disp: 180 capsule, Rfl: 2   ketoconazole (NIZORAL) 2 % cream, Apply 1 application topically daily. Apply on bottom of foot and thin layer between toes, Disp: 60 g, Rfl: 2   levothyroxine (SYNTHROID) 50 MCG tablet, TAKE 1 TABLET BY MOUTH EVERY DAY (Patient taking differently: Take 50 mcg by mouth daily before breakfast.), Disp: 90 tablet, Rfl: 1   losartan (COZAAR) 100 MG tablet, TAKE 1 TABLET BY MOUTH EVERYDAY AT BEDTIME, Disp: 30 tablet, Rfl: 2   Multiple Vitamin (MULTIVITAMIN WITH MINERALS) TABS tablet, Take 1 tablet by mouth daily., Disp: , Rfl:    pantoprazole (PROTONIX) 40 MG tablet, Take 1 tablet (40 mg total) by mouth daily., Disp: 30 tablet, Rfl: 0   sertraline (ZOLOFT) 50 MG tablet, Take 50 mg by mouth at bedtime., Disp: , Rfl:    sodium chloride (OCEAN) 0.65 % SOLN nasal  spray, Place 2 sprays into both nostrils as needed for congestion., Disp: 30 mL, Rfl: 2   Family History  Problem Relation Age of Onset   Breast cancer Sister 32       materal 1/2 sister   Hypertension Sister    Hypertension Mother    Heart disease Father    Hypertension Brother      Social History   Tobacco Use   Smoking status: Former    Packs/day: 1.50    Years: 24.00    Total pack years: 36.00    Types: Cigarettes    Quit date: 1993    Years since quitting: 30.4   Smokeless tobacco: Never   Tobacco comments:    quit 1995.   Vaping Use   Vaping Use: Never used  Substance Use Topics   Alcohol use: Not Currently   Drug use: No    Allergies as of 06/24/2021  - Review Complete 06/24/2021  Allergen Reaction Noted   Celecoxib Hives 07/25/2016   Hydrocodone Nausea Only 06/02/2020   Pollen extract     Sodium ferric gluconate [ferrous gluconate]  11/20/2020   Nasacort [triamcinolone] Other (See Comments) 12/07/2015   Vicodin [hydrocodone-acetaminophen] Nausea Only 09/03/2014    Review of Systems:    All systems reviewed and negative except where noted in HPI.   Physical Exam:  BP 133/71 (BP Location: Left Arm, Patient Position: Sitting, Cuff Size: Normal)   Pulse (!) 103   Temp 99.2 F (37.3 C) (Oral)   Ht '5\' 1"'$  (1.549 m)   Wt 177 lb 2 oz (80.3 kg)   BMI 33.47 kg/m  No LMP recorded. Patient has had a hysterectomy.  General:   Alert,  Well-developed, well-nourished, pleasant and cooperative in NAD Head:  Normocephalic and atraumatic. Eyes:  Sclera clear, no icterus.   Conjunctiva pink. Ears:  Normal auditory acuity. Nose:  No deformity, discharge, or lesions. Mouth:  No deformity or lesions,oropharynx pink & moist. Neck:  Supple; no masses or thyromegaly. Lungs:  Respirations even and unlabored.  Clear throughout to auscultation.   No wheezes, crackles, or rhonchi. No acute distress. Heart:  Regular rate and rhythm; no murmurs, clicks, rubs, or gallops. Abdomen:  Normal bowel sounds. Soft, non-tender and non-distended without masses, hepatosplenomegaly or hernias noted.  No guarding or rebound tenderness.   Rectal: Small perianal skin tag, nontender digital rectal exam, anoscopy revealed large right posterior internal and external hemorrhoid with stigmata of recent bleeding and large left lateral hemorrhoid. Msk:  Symmetrical without gross deformities. Good, equal movement & strength bilaterally. Pulses:  Normal pulses noted. Extremities:  No clubbing or edema.  No cyanosis. Neurologic:  Alert and oriented x3;  grossly normal neurologically. Skin:  Intact without significant lesions or rashes. No jaundice. Psych:  Alert and cooperative.  Normal mood and affect.  Imaging Studies: Reviewed  Assessment and Plan:   Jennifer [name] is a 71 y.o. pleasant African-American female with history of gastric sleeve, duodenal switch with recurrent episodes of hematochezia resulting in severe acute blood loss anemia.  Patient underwent several bidirectional endoscopy within last 3 years and no luminal source of bleeding identified.  The source of bleeding was thought to be secondary to bleeding from internal hemorrhoids, underwent outpatient hemorrhoid ligation of the left lateral and right posterior hemorrhoids.  Recurrence of rectal bleeding in 05/2021, CT angio and tagged RBC scan negative video capsule endoscopy was unremarkable  With ongoing painless rectal bleeding with each bowel movement, recommend ligation of large right posterior  hemorrhoid today.  It had stigmata of recent bleeding on anoscopy.  Therefore, decision was made to perform ligation of right posterior hemorrhoid Discussed about the risks and benefits of hemorrhoid banding, consent obtained. Proceed with hemorrhoid ligation today  Iron deficiency anemia due to chronic blood loss Recheck CBC, iron panel and B12, folate levels today Follow-up with hematology as needed for parenteral iron therapy If patient continues to have recurrent painless rectal bleeding, recommend hemorrhoidectomy  PROCEDURE NOTE: The patient presents with symptomatic grade 2 hemorrhoids, unresponsive to maximal medical therapy, requesting rubber band ligation of his/her hemorrhoidal disease.  All risks, benefits and alternative forms of therapy were described and informed consent was obtained.  In the Left Lateral Decubitus position (if anoscopy is performed) anoscopic examination revealed grade 2 hemorrhoids in the RP and LL position(s).   The decision was made to band the RP internal hemorrhoid, and the [place]iscoe was used to perform band ligation without complication.  Digital anorectal  examination was then performed to assure proper positioning of the band, and to adjust the banded tissue as required.  The patient was discharged home without pain or other issues.  Dietary and behavioral recommendations were given and (if necessary - prescriptions were given), along with follow-up instructions.  The patient will return 2 weeks for follow-up and possible additional banding as required.  No complications were encountered and the patient tolerated the procedure well.   Follow up in 2 weeks with Dr. Basil Dess, MD

## 2021-06-25 LAB — IRON,TIBC AND FERRITIN PANEL
Ferritin: 242 ng/mL — ABNORMAL HIGH (ref 15–150)
Iron Saturation: 5 % — CL (ref 15–55)
Iron: 21 ug/dL — ABNORMAL LOW (ref 27–139)
Total Iron Binding Capacity: 432 ug/dL (ref 250–450)
UIBC: 411 ug/dL — ABNORMAL HIGH (ref 118–369)

## 2021-06-25 LAB — CBC
Hematocrit: 31.9 % — ABNORMAL LOW (ref 34.0–46.6)
Hemoglobin: 9.9 g/dL — ABNORMAL LOW (ref 11.1–15.9)
MCH: 25.8 pg — ABNORMAL LOW (ref 26.6–33.0)
MCHC: 31 g/dL — ABNORMAL LOW (ref 31.5–35.7)
MCV: 83 fL (ref 79–97)
Platelets: 332 10*3/uL (ref 150–450)
RBC: 3.84 x10E6/uL (ref 3.77–5.28)
RDW: 20.8 % — ABNORMAL HIGH (ref 11.7–15.4)
WBC: 6 10*3/uL (ref 3.4–10.8)

## 2021-06-25 LAB — B12 AND FOLATE PANEL
Folate: 3.3 ng/mL (ref >3.0)
Vitamin B-12: 804 pg/mL (ref 232–1245)

## 2021-06-30 ENCOUNTER — Ambulatory Visit
Admission: RE | Admit: 2021-06-30 | Discharge: 2021-06-30 | Disposition: A | Discharge status: Home / Self Care | Payer: Medicare Other | Source: Ambulatory Visit | Attending: Family | Admitting: Family

## 2021-06-30 DIAGNOSIS — Z1231 Encounter for screening mammogram for malignant neoplasm of breast: Secondary | ICD-10-CM | POA: Insufficient documentation

## 2021-07-02 ENCOUNTER — Other Ambulatory Visit: Payer: Self-pay | Admitting: Student in an Organized Health Care Education/Training Program

## 2021-07-06 ENCOUNTER — Ambulatory Visit (INDEPENDENT_AMBULATORY_CARE_PROVIDER_SITE_OTHER): Payer: Medicare Other | Admitting: Podiatry

## 2021-07-06 DIAGNOSIS — E1142 Type 2 diabetes mellitus with diabetic polyneuropathy: Secondary | ICD-10-CM | POA: Diagnosis not present

## 2021-07-06 DIAGNOSIS — S90822A Blister (nonthermal), left foot, initial encounter: Secondary | ICD-10-CM

## 2021-07-06 NOTE — Progress Notes (Signed)
Subjective:  Patient ID: Jennifer [name], female    DOB: Feb 10, 1950,  MRN: [id number]  Chief Complaint  Patient presents with   Blister    71 y.o. female presents with the above complaint.  Patient presents with left heel blister that started about a few days ago.  Is not too big.  She is a diabetic with last A1c of 5.5.  She states that it may be the shoes because she has been walking a pretty good amount.  She denies any other acute complaints.  Review of Systems: Negative except as noted in the HPI. Denies N/V/F/Ch.  Past Medical History:  Diagnosis Date   Anemia    Anxiety    Bariatric surgery status    Constipation    COVID-19    Dysrhythmia    Elevated liver enzymes    GERD (gastroesophageal reflux disease)    Hemorrhoids    Herpes genitalis    High cholesterol    Hyperlipidemia    Hypertension    Hypothyroidism    IDA (iron deficiency anemia) 02/09/2021   Neuropathy    Osteoarthritis    Sleep apnea     Current Outpatient Medications:    acetaminophen (TYLENOL) 325 MG tablet, Take 2 tablets (650 mg total) by mouth every 6 (six) hours as needed for mild pain (or Fever >/= 101)., Disp: , Rfl:    acyclovir (ZOVIRAX) 400 MG tablet, TAKE 1 TABLET BY MOUTH EVERY DAY (Patient taking differently: Take 400 mg by mouth daily at 6 (six) AM.), Disp: 30 tablet, Rfl: 5   ANUCORT-HC 25 MG suppository, UNWRAP AND INSERT 1 SUPPOSITORY RECTALLY TWICE A DAY, Disp: 12 suppository, Rfl: 1   calcium citrate (CALCITRATE - DOSED IN MG ELEMENTAL CALCIUM) 950 (200 Ca) MG tablet, Take 200 mg of elemental calcium by mouth daily., Disp: , Rfl:    fexofenadine (ALLEGRA ALLERGY) 180 MG tablet, Take 1 tablet (180 mg total) by mouth daily., Disp: 90 tablet, Rfl: 1   fluticasone (FLONASE) 50 MCG/ACT nasal spray, Place 2 sprays into both nostrils daily., Disp: 16 g, Rfl: 0   gabapentin (NEURONTIN) 300 MG capsule, Take 1 capsule (300 mg total) by mouth 3 (three) times daily as needed., Disp: 180  capsule, Rfl: 2   ketoconazole (NIZORAL) 2 % cream, Apply 1 application topically daily. Apply on bottom of foot and thin layer between toes, Disp: 60 g, Rfl: 2   levothyroxine (SYNTHROID) 50 MCG tablet, TAKE 1 TABLET BY MOUTH EVERY DAY (Patient taking differently: Take 50 mcg by mouth daily before breakfast.), Disp: 90 tablet, Rfl: 1   losartan (COZAAR) 100 MG tablet, TAKE 1 TABLET BY MOUTH EVERYDAY AT BEDTIME, Disp: 30 tablet, Rfl: 2   Multiple Vitamin (MULTIVITAMIN WITH MINERALS) TABS tablet, Take 1 tablet by mouth daily., Disp: , Rfl:    pantoprazole (PROTONIX) 40 MG tablet, TAKE 1 TABLET BY MOUTH EVERY DAY, Disp: 30 tablet, Rfl: 0   sertraline (ZOLOFT) 50 MG tablet, Take 50 mg by mouth at bedtime., Disp: , Rfl:    sodium chloride (OCEAN) 0.65 % SOLN nasal spray, Place 2 sprays into both nostrils as needed for congestion., Disp: 30 mL, Rfl: 2  Social History   Tobacco Use  Smoking Status Former   Packs/day: 1.50   Years: 24.00   Total pack years: 36.00   Types: Cigarettes   Quit date: 1993   Years since quitting: 30.4  Smokeless Tobacco Never  Tobacco Comments   quit 1995.     Allergies  Allergen  Reactions   Celecoxib Hives   Hydrocodone Nausea Only   Pollen Extract    Sodium Ferric Gluconate [Ferrous Gluconate]     IV ferric gluconate - shortly after the infusion developed back pain shooting into left arm and bilateral hand swelling   Nasacort [Triamcinolone] Other (See Comments)    Nasal - Nose Bleeds   Vicodin [Hydrocodone-Acetaminophen] Nausea Only   Objective:  There were no vitals filed for this visit. There is no height or weight on file to calculate BMI. Constitutional Well developed. Well nourished.  Vascular Dorsalis pedis pulses palpable bilaterally. Posterior tibial pulses palpable bilaterally. Capillary refill normal to all digits.  No cyanosis or clubbing noted. Pedal hair growth normal.  Neurologic Normal speech. Oriented to person, place, and  time. Epicritic sensation to light touch grossly present bilaterally.  Dermatologic Blister formation noted with about 3 cc of serosanguineous fluid that was drained with lancing of the blister.  Pain on palpation to the blister.  No signs of purulent drainage noted.  No redness noted.  Orthopedic: Normal joint ROM without pain or crepitus bilaterally. No visible deformities. No bony tenderness.   Radiographs: None Assessment:   1. Blister of left heel, initial encounter   2. Diabetic polyneuropathy associated with type 2 diabetes mellitus ([place])     Plan:  Patient was evaluated and treated and all questions answered.  Left heel blister with underlying history of diabetes -All questions and concerns were discussed with the patient in extensive detail. -I discussed with the patient to remove friction which could be between the shoes and the socks versus socks in the foot.  I discussed with the patient in extensive detail she states she will be on the look out for most causing the infection.  I lanced the blister into drain serous fluid.  No purulent drainage noted. -Keep it covered with triple antibiotic and a Band-Aid.  I left the skin after draining it to provide a natural barrier.  She states understanding  No follow-ups on file.

## 2021-07-08 ENCOUNTER — Ambulatory Visit: Payer: Medicare Other | Admitting: Gastroenterology

## 2021-07-15 DIAGNOSIS — Z23 Encounter for immunization: Secondary | ICD-10-CM | POA: Diagnosis not present

## 2021-07-22 ENCOUNTER — Telehealth: Payer: Self-pay | Admitting: Family

## 2021-07-22 DIAGNOSIS — L089 Local infection of the skin and subcutaneous tissue, unspecified: Secondary | ICD-10-CM | POA: Diagnosis not present

## 2021-07-22 DIAGNOSIS — S90821A Blister (nonthermal), right foot, initial encounter: Secondary | ICD-10-CM | POA: Diagnosis not present

## 2021-07-22 NOTE — Telephone Encounter (Signed)
LMTCB

## 2021-07-22 NOTE — Telephone Encounter (Signed)
Patient called and went to Urgent Care today for her rt heel is infected. Antibiotics were given and a referral placed to Dr Lottie Rater. She would like to know if she is pre-diabetic. [name] office tried to schedule appointment, but there  are no 30 min. Slots available. Please call patient.

## 2021-07-27 NOTE — Telephone Encounter (Signed)
Spoke to patient and she stated that she would just set an appointment after her appointment with Podiatry on 7/18 depending on what they say.

## 2021-07-28 ENCOUNTER — Other Ambulatory Visit: Payer: Self-pay | Admitting: Family

## 2021-08-03 ENCOUNTER — Ambulatory Visit (INDEPENDENT_AMBULATORY_CARE_PROVIDER_SITE_OTHER): Payer: Medicare Other | Admitting: Podiatry

## 2021-08-03 ENCOUNTER — Encounter: Payer: Self-pay | Admitting: Podiatry

## 2021-08-03 DIAGNOSIS — E1142 Type 2 diabetes mellitus with diabetic polyneuropathy: Secondary | ICD-10-CM

## 2021-08-03 DIAGNOSIS — S90821A Blister (nonthermal), right foot, initial encounter: Secondary | ICD-10-CM

## 2021-08-03 NOTE — Progress Notes (Signed)
Subjective:  Patient ID: Jennifer [name], female    DOB: [birth year],  MRN: [id number]  Chief Complaint  Patient presents with   Wound Check    "It's draining now."    71 y.o. female presents with the above complaint.  Patient presents with right heel blister that started about a few days ago.  Is not too big.  She is a diabetic with last A1c of 5.5.  She states that it may be the shoes because she has been walking a pretty good amount.  She denies any other acute complaints.  She says the left side is completely healed up.  She denies any other acute complaints of the left side  Review of Systems: Negative except as noted in the HPI. Denies N/V/F/Ch.  Past Medical History:  Diagnosis Date   Anemia    Anxiety    Bariatric surgery status    Constipation    COVID-19    Dysrhythmia    Elevated liver enzymes    GERD (gastroesophageal reflux disease)    Hemorrhoids    Herpes genitalis    High cholesterol    Hyperlipidemia    Hypertension    Hypothyroidism    IDA (iron deficiency anemia) 02/09/2021   Neuropathy    Osteoarthritis    Sleep apnea     Current Outpatient Medications:    acetaminophen (TYLENOL) 325 MG tablet, Take 2 tablets (650 mg total) by mouth every 6 (six) hours as needed for mild pain (or Fever >/= 101)., Disp: , Rfl:    acyclovir (ZOVIRAX) 400 MG tablet, TAKE 1 TABLET BY MOUTH EVERY DAY (Patient taking differently: Take 400 mg by mouth daily at 6 (six) AM.), Disp: 30 tablet, Rfl: 5   ANUCORT-HC 25 MG suppository, UNWRAP AND INSERT 1 SUPPOSITORY RECTALLY TWICE A DAY, Disp: 12 suppository, Rfl: 1   calcium citrate (CALCITRATE - DOSED IN MG ELEMENTAL CALCIUM) 950 (200 Ca) MG tablet, Take 200 mg of elemental calcium by mouth daily., Disp: , Rfl:    fexofenadine (ALLEGRA ALLERGY) 180 MG tablet, Take 1 tablet (180 mg total) by mouth daily., Disp: 90 tablet, Rfl: 1   fluticasone (FLONASE) 50 MCG/ACT nasal spray, Place 2 sprays into both nostrils daily., Disp: 16 g, Rfl:  0   gabapentin (NEURONTIN) 300 MG capsule, Take 1 capsule (300 mg total) by mouth 3 (three) times daily as needed., Disp: 180 capsule, Rfl: 2   ketoconazole (NIZORAL) 2 % cream, Apply 1 application topically daily. Apply on bottom of foot and thin layer between toes, Disp: 60 g, Rfl: 2   levothyroxine (SYNTHROID) 50 MCG tablet, TAKE 1 TABLET BY MOUTH EVERY DAY (Patient taking differently: Take 50 mcg by mouth daily before breakfast.), Disp: 90 tablet, Rfl: 1   losartan (COZAAR) 100 MG tablet, TAKE 1 TABLET BY MOUTH EVERYDAY AT BEDTIME, Disp: 30 tablet, Rfl: 2   Multiple Vitamin (MULTIVITAMIN WITH MINERALS) TABS tablet, Take 1 tablet by mouth daily., Disp: , Rfl:    pantoprazole (PROTONIX) 40 MG tablet, TAKE 1 TABLET BY MOUTH EVERY DAY, Disp: 30 tablet, Rfl: 0   sertraline (ZOLOFT) 50 MG tablet, Take 50 mg by mouth at bedtime., Disp: , Rfl:    sodium chloride (OCEAN) 0.65 % SOLN nasal spray, Place 2 sprays into both nostrils as needed for congestion., Disp: 30 mL, Rfl: 2  Social History   Tobacco Use  Smoking Status Former   Packs/day: 1.50   Years: 24.00   Total pack years: 36.00   Types: Cigarettes  Quit date: 78   Years since quitting: 30.5  Smokeless Tobacco Never  Tobacco Comments   quit 1995.     Allergies  Allergen Reactions   Celecoxib Hives   Hydrocodone Nausea Only   Pollen Extract    Sodium Ferric Gluconate [Ferrous Gluconate]     IV ferric gluconate - shortly after the infusion developed back pain shooting into left arm and bilateral hand swelling   Nasacort [Triamcinolone] Other (See Comments)    Nasal - Nose Bleeds   Vicodin [Hydrocodone-Acetaminophen] Nausea Only   Objective:  There were no vitals filed for this visit. There is no height or weight on file to calculate BMI. Constitutional Well developed. Well nourished.  Vascular Dorsalis pedis pulses palpable bilaterally. Posterior tibial pulses palpable bilaterally. Capillary refill normal to all digits.   No cyanosis or clubbing noted. Pedal hair growth normal.  Neurologic Normal speech. Oriented to person, place, and time. Epicritic sensation to light touch grossly present bilaterally.  Dermatologic Blister formation noted without any fluid after lancing it.  Pain on palpation to the blister.  No signs of purulent drainage noted.  No redness noted.  Orthopedic: Normal joint ROM without pain or crepitus bilaterally. No visible deformities. No bony tenderness.   Radiographs: None Assessment:   1. Blister of right heel, initial encounter   2. Diabetic polyneuropathy associated with type 2 diabetes mellitus ([place])      Plan:  Patient was evaluated and treated and all questions answered.  Left heel blister -Completely healed and reepithelialized  Right heel blister with underlying history of diabetes -All questions and concerns were discussed with the patient in extensive detail. -I discussed with the patient to remove friction which could be between the shoes and the socks versus socks in the foot.  I discussed with the patient in extensive detail she states she will be on the look out for most causing the infection.  I lanced the blister into drain serous fluid.  No purulent drainage noted. -Keep it covered with triple antibiotic and a Band-Aid.  I left the skin after draining it to provide a natural barrier.  She states understanding  No follow-ups on file.

## 2021-08-13 ENCOUNTER — Other Ambulatory Visit: Payer: Self-pay | Admitting: Family

## 2021-08-13 DIAGNOSIS — E039 Hypothyroidism, unspecified: Secondary | ICD-10-CM

## 2021-08-17 ENCOUNTER — Telehealth: Payer: Self-pay | Admitting: *Deleted

## 2021-08-17 ENCOUNTER — Other Ambulatory Visit: Payer: Self-pay

## 2021-08-17 DIAGNOSIS — D500 Iron deficiency anemia secondary to blood loss (chronic): Secondary | ICD-10-CM

## 2021-08-17 NOTE — Telephone Encounter (Signed)
Patient called asking if she can come in for labs and be seen for low energy. Her next appointment is 11/12/21 lab 11/15/21 md

## 2021-08-18 ENCOUNTER — Encounter: Payer: Self-pay | Admitting: Oncology

## 2021-08-18 ENCOUNTER — Other Ambulatory Visit: Payer: Self-pay

## 2021-08-18 DIAGNOSIS — E119 Type 2 diabetes mellitus without complications: Secondary | ICD-10-CM

## 2021-08-18 DIAGNOSIS — D500 Iron deficiency anemia secondary to blood loss (chronic): Secondary | ICD-10-CM

## 2021-08-18 NOTE — Telephone Encounter (Signed)
Pt scheduled for labs on 8/3. Pt aware.

## 2021-08-19 ENCOUNTER — Inpatient Hospital Stay: Payer: Medicare Other | Attending: Oncology

## 2021-08-19 ENCOUNTER — Other Ambulatory Visit: Payer: Self-pay

## 2021-08-19 DIAGNOSIS — Z87891 Personal history of nicotine dependence: Secondary | ICD-10-CM | POA: Diagnosis not present

## 2021-08-19 DIAGNOSIS — D500 Iron deficiency anemia secondary to blood loss (chronic): Secondary | ICD-10-CM | POA: Diagnosis not present

## 2021-08-19 DIAGNOSIS — E119 Type 2 diabetes mellitus without complications: Secondary | ICD-10-CM

## 2021-08-19 DIAGNOSIS — R5383 Other fatigue: Secondary | ICD-10-CM | POA: Insufficient documentation

## 2021-08-19 DIAGNOSIS — K649 Unspecified hemorrhoids: Secondary | ICD-10-CM | POA: Insufficient documentation

## 2021-08-19 DIAGNOSIS — Z79899 Other long term (current) drug therapy: Secondary | ICD-10-CM | POA: Insufficient documentation

## 2021-08-19 DIAGNOSIS — E039 Hypothyroidism, unspecified: Secondary | ICD-10-CM | POA: Insufficient documentation

## 2021-08-19 LAB — CBC WITH DIFFERENTIAL/PLATELET
Abs Immature Granulocytes: 0.02 10*3/uL (ref 0.00–0.07)
Basophils Absolute: 0.1 10*3/uL (ref 0.0–0.1)
Basophils Relative: 1 %
Eosinophils Absolute: 0.1 10*3/uL (ref 0.0–0.5)
Eosinophils Relative: 1 %
HCT: 30.8 % — ABNORMAL LOW (ref 36.0–46.0)
Hemoglobin: 9.1 g/dL — ABNORMAL LOW (ref 12.0–15.0)
Immature Granulocytes: 0 %
Lymphocytes Relative: 24 %
Lymphs Abs: 2 10*3/uL (ref 0.7–4.0)
MCH: 22.4 pg — ABNORMAL LOW (ref 26.0–34.0)
MCHC: 29.5 g/dL — ABNORMAL LOW (ref 30.0–36.0)
MCV: 75.9 fL — ABNORMAL LOW (ref 80.0–100.0)
Monocytes Absolute: 1.1 10*3/uL — ABNORMAL HIGH (ref 0.1–1.0)
Monocytes Relative: 13 %
Neutro Abs: 5.1 10*3/uL (ref 1.7–7.7)
Neutrophils Relative %: 61 %
Platelets: 337 10*3/uL (ref 150–400)
RBC: 4.06 MIL/uL (ref 3.87–5.11)
RDW: 23.9 % — ABNORMAL HIGH (ref 11.5–15.5)
WBC: 8.3 10*3/uL (ref 4.0–10.5)
nRBC: 0 % (ref 0.0–0.2)

## 2021-08-19 LAB — IRON AND TIBC
Iron: 22 ug/dL — ABNORMAL LOW (ref 28–170)
Saturation Ratios: 5 % — ABNORMAL LOW (ref 10.4–31.8)
TIBC: 428 ug/dL (ref 250–450)
UIBC: 406 ug/dL

## 2021-08-19 LAB — FERRITIN: Ferritin: 26 ng/mL (ref 11–307)

## 2021-08-19 LAB — HEMOGLOBIN A1C
Hgb A1c MFr Bld: 5.5 % (ref 4.8–5.6)
Mean Plasma Glucose: 111.15 mg/dL

## 2021-08-20 ENCOUNTER — Telehealth: Payer: Self-pay

## 2021-08-20 NOTE — Telephone Encounter (Signed)
Called and informed patient for lab results and Dr. Collie Siad recommendation to do IV venofer weekly x 3 and to keep same f/u appt. Advised our scheduler will get these scheduled and will notify her. Patient verbalized understanding.   [place], please schedule patient for: IV venofer weekly x 3.  Please notify patient of appt. Thanks

## 2021-08-20 NOTE — Telephone Encounter (Signed)
-----   Message from Earlie Server, MD sent at 08/20/2021 12:03 AM EDT ----- Labs are reviewed. Iron deficient. Please arrange patient to get IV venofer weekly x 3 Keep current follow up appt with me.

## 2021-08-21 ENCOUNTER — Other Ambulatory Visit: Payer: Self-pay | Admitting: Family

## 2021-08-21 DIAGNOSIS — Z0000 Encounter for general adult medical examination without abnormal findings: Secondary | ICD-10-CM

## 2021-08-27 ENCOUNTER — Inpatient Hospital Stay: Payer: Medicare Other

## 2021-08-27 ENCOUNTER — Telehealth: Payer: Self-pay | Admitting: Family

## 2021-08-27 VITALS — BP 112/65 | HR 77 | Temp 96.0°F | Resp 18

## 2021-08-27 DIAGNOSIS — D62 Acute posthemorrhagic anemia: Secondary | ICD-10-CM

## 2021-08-27 DIAGNOSIS — Z87891 Personal history of nicotine dependence: Secondary | ICD-10-CM | POA: Diagnosis not present

## 2021-08-27 DIAGNOSIS — D500 Iron deficiency anemia secondary to blood loss (chronic): Secondary | ICD-10-CM | POA: Diagnosis not present

## 2021-08-27 DIAGNOSIS — E039 Hypothyroidism, unspecified: Secondary | ICD-10-CM | POA: Diagnosis not present

## 2021-08-27 DIAGNOSIS — K649 Unspecified hemorrhoids: Secondary | ICD-10-CM | POA: Diagnosis not present

## 2021-08-27 DIAGNOSIS — Z79899 Other long term (current) drug therapy: Secondary | ICD-10-CM | POA: Diagnosis not present

## 2021-08-27 DIAGNOSIS — R5383 Other fatigue: Secondary | ICD-10-CM | POA: Diagnosis not present

## 2021-08-27 MED ORDER — SODIUM CHLORIDE 0.9 % IV SOLN
200.0000 mg | Freq: Once | INTRAVENOUS | Status: AC
  Administered 2021-08-27: 200 mg via INTRAVENOUS
  Filled 2021-08-27: qty 200

## 2021-08-27 MED ORDER — SODIUM CHLORIDE 0.9 % IV SOLN
Freq: Once | INTRAVENOUS | Status: AC
  Filled 2021-08-27: qty 250

## 2021-08-27 NOTE — Telephone Encounter (Addendum)
Pt called in stating her feet are swelling for over two weeks and its increasing. Pt would like to know her blood work reading because she wants to know if she has diabetes. Sent to access nurse

## 2021-08-27 NOTE — Telephone Encounter (Signed)
Call and triage  Please confirm she is not having any shortness of breath, increased heat /redness in her legs, or concern of infection or blood clot in her legs.  If swelling is bilateral and not particularly bothersome nor accompanied with shortness of breath I am happy to see her next week.  Please sch  If she is having shortness of breath concern for infection or blood clot, she would need to go to urgent care today. I recommend [facility].   Lab Results  Component Value Date   HGBA1C 5.5 08/19/2021   A1c is excellent which was drawn by hematology. She is not prediabetic nor diabetic.

## 2021-08-27 NOTE — Telephone Encounter (Signed)
Spoke to patient about the swelling in her feet. Patient stated that she does have neuropathy and she takes Gabapentin for that but she isn't sure why they are swollen at the moment. Patient stated that she is not  having Shortness of Breath at the moment but I insisted that if she does begin to have it that she needs to go to [facility] or ED immediately. Patient stated that she would and  She has appt scheduled to come into the office on Tues 08/31/21.

## 2021-08-31 ENCOUNTER — Encounter: Payer: Self-pay | Admitting: Family

## 2021-08-31 ENCOUNTER — Other Ambulatory Visit: Payer: Self-pay | Admitting: Family

## 2021-08-31 ENCOUNTER — Telehealth: Payer: Self-pay | Admitting: Family

## 2021-08-31 ENCOUNTER — Ambulatory Visit (INDEPENDENT_AMBULATORY_CARE_PROVIDER_SITE_OTHER): Payer: Medicare Other | Admitting: Family

## 2021-08-31 VITALS — BP 130/70 | HR 65 | Temp 98.8°F | Ht 61.0 in | Wt 183.0 lb

## 2021-08-31 DIAGNOSIS — M545 Low back pain, unspecified: Secondary | ICD-10-CM | POA: Diagnosis not present

## 2021-08-31 DIAGNOSIS — M7989 Other specified soft tissue disorders: Secondary | ICD-10-CM | POA: Diagnosis not present

## 2021-08-31 DIAGNOSIS — G629 Polyneuropathy, unspecified: Secondary | ICD-10-CM

## 2021-08-31 DIAGNOSIS — B353 Tinea pedis: Secondary | ICD-10-CM | POA: Diagnosis not present

## 2021-08-31 DIAGNOSIS — I872 Venous insufficiency (chronic) (peripheral): Secondary | ICD-10-CM | POA: Diagnosis not present

## 2021-08-31 DIAGNOSIS — F419 Anxiety disorder, unspecified: Secondary | ICD-10-CM

## 2021-08-31 DIAGNOSIS — F32A Depression, unspecified: Secondary | ICD-10-CM | POA: Diagnosis not present

## 2021-08-31 DIAGNOSIS — G4700 Insomnia, unspecified: Secondary | ICD-10-CM

## 2021-08-31 LAB — BASIC METABOLIC PANEL
BUN: 7 mg/dL (ref 6–23)
CO2: 27 mEq/L (ref 19–32)
Calcium: 9.1 mg/dL (ref 8.4–10.5)
Chloride: 98 mEq/L (ref 96–112)
Creatinine, Ser: 0.66 mg/dL (ref 0.40–1.20)
GFR: 87.87 mL/min (ref >60.00)
Glucose, Bld: 94 mg/dL (ref 70–99)
Potassium: 5.1 mEq/L (ref 3.5–5.1)
Sodium: 135 mEq/L (ref 135–145)

## 2021-08-31 MED ORDER — GABAPENTIN 300 MG PO CAPS: ORAL_CAPSULE | ORAL | 3 refills | Status: DC

## 2021-08-31 MED ORDER — TRAZODONE HCL 50 MG PO TABS: 25.0000 mg | ORAL_TABLET | Freq: Every evening | ORAL | 3 refills | Status: DC | PRN

## 2021-08-31 MED ORDER — CLOTRIMAZOLE 1 % EX CREA: 1.0000 | TOPICAL_CREAM | Freq: Two times a day (BID) | CUTANEOUS | 1 refills | Status: DC

## 2021-08-31 NOTE — Telephone Encounter (Signed)
Call patient I consulted with podiatry, Dr. Posey Pronto, agree that right heel look like tinea pedis (athlete's foot) which is a fungal infection.  I have sent in clotrimazole ointment for her to put on the right heel. Please ensure she has follow-up with me

## 2021-08-31 NOTE — Progress Notes (Signed)
Ankles are very swollen!!! Would like referral for MRI of right side from fall last year needs refill on protonix

## 2021-08-31 NOTE — Assessment & Plan Note (Signed)
Presentation consistent with tinea pedis. I have sent a message to podiatry, Dr. Posey Pronto in regards to antifungal therapy would recommend.  I would consider clotrimazole ointment

## 2021-08-31 NOTE — Telephone Encounter (Signed)
LVM to call back to inform her to pick up rx at pharmacy and to schedule f/up in 2 wks

## 2021-08-31 NOTE — Progress Notes (Signed)
Subjective:    Patient ID: Jennifer [name], female    DOB: 07/03/1950, 71 y.o.   MRN: [id number]  CC: Jennifer [name] is a 71 y.o. female who presents today for an acute visit.    HPI: Bilateral ankle swelling for several weeks, unchanged No sob, cp, increased warmth, weeping.      She does complain of the right heel peeling of the skin.  She currently has it wrapped and is following with podiatry.  She has been using iodine as he suggested for blister 08/03/21.  No purulent discharge.   She is compliant with triamterene hydrochlorothiazide.   Fall last year on left hip. She has occasional right lateral hip 'shooting pain' and stiffness. Worse after long periods of sitting. No formal exercise.  No groin pain, saddle anesthesia, fecal or urinary incontinence. No numbness coming down the legs.   She takes tylenol arthritis one tablet every other day or QOD.  Right pelvis XR without acute fracture 10/09/2020, lumbar XR with multilevel disc space height loss, mild to moderate degenerative change.   She would like to stop zoloft as not helpful for sleep. No anxiety.   History of venous insufficiency.  She also has peripheral neuropathy and takes gabapentin 600 mg BID with breakthrough  HISTORY:  Past Medical History:  Diagnosis Date   Anemia    Anxiety    Bariatric surgery status    Constipation    COVID-19    Dysrhythmia    Elevated liver enzymes    GERD (gastroesophageal reflux disease)    Hemorrhoids    Herpes genitalis    High cholesterol    Hyperlipidemia    Hypertension    Hypothyroidism    IDA (iron deficiency anemia) 02/09/2021   Neuropathy    Osteoarthritis    Sleep apnea    Past Surgical History:  Procedure Laterality Date   ABDOMINAL HYSTERECTOMY     total for fibroids no h/o abnormal pap   bariatric sleeve  2015   BREAST EXCISIONAL BIOPSY Left 1998   carpal tunnel repair     COLONOSCOPY WITH PROPOFOL N/A 02/11/2016   Procedure: COLONOSCOPY WITH  PROPOFOL;  Surgeon: Jonathon Bellows, MD;  Location: [facility] ENDOSCOPY;  Service: Endoscopy;  Laterality: N/A;   COLONOSCOPY WITH PROPOFOL N/A 10/01/2019   Procedure: COLONOSCOPY WITH PROPOFOL;  Surgeon: Jonathon Bellows, MD;  Location: [facility] ENDOSCOPY;  Service: Gastroenterology;  Laterality: N/A;   COLONOSCOPY WITH PROPOFOL N/A 11/19/2020   Procedure: COLONOSCOPY WITH PROPOFOL;  Surgeon: Jonathon Bellows, MD;  Location: [facility] ENDOSCOPY;  Service: Gastroenterology;  Laterality: N/A;   ESOPHAGOGASTRODUODENOSCOPY (EGD) WITH PROPOFOL N/A 12/02/2019   Procedure: ESOPHAGOGASTRODUODENOSCOPY (EGD) WITH PROPOFOL;  Surgeon: Jonathon Bellows, MD;  Location: [facility] ENDOSCOPY;  Service: Gastroenterology;  Laterality: N/A;   ESOPHAGOGASTRODUODENOSCOPY (EGD) WITH PROPOFOL N/A 11/19/2020   Procedure: ESOPHAGOGASTRODUODENOSCOPY (EGD) WITH PROPOFOL;  Surgeon: Jonathon Bellows, MD;  Location: [facility] ENDOSCOPY;  Service: Gastroenterology;  Laterality: N/A;   FLEXIBLE SIGMOIDOSCOPY N/A 02/06/2021   Procedure: FLEXIBLE SIGMOIDOSCOPY;  Surgeon: [name], DO;  Location: [facility] ENDOSCOPY;  Service: Gastroenterology;  Laterality: N/A;   GIVENS CAPSULE STUDY N/A 06/07/2021   Procedure: GIVENS CAPSULE STUDY;  Surgeon: Lin Landsman, MD;  Location: [facility] ENDOSCOPY;  Service: Gastroenterology;  Laterality: N/A;   GIVENS CAPSULE STUDY N/A 06/09/2021   Procedure: GIVENS CAPSULE STUDY;  Surgeon: Lin Landsman, MD;  Location: [facility] ENDOSCOPY;  Service: Gastroenterology;  Laterality: N/A;   HEMORRHOID SURGERY     LAPAROSCOPIC GASTRIC RESTRICTIVE DUODENAL PROCEDURE (DUODENAL SWITCH)  Bilateral    2020   Family History  Problem Relation Age of Onset   Breast cancer Sister 78       materal 1/2 sister   Hypertension Sister    Hypertension Mother    Heart disease Father    Hypertension Brother     Allergies: Celecoxib, Hydrocodone, Pollen extract, Sodium ferric gluconate [ferrous gluconate], Nasacort [triamcinolone], and Vicodin  [hydrocodone-acetaminophen] Current Outpatient Medications on File Prior to Visit  Medication Sig Dispense Refill   acetaminophen (TYLENOL) 325 MG tablet Take 2 tablets (650 mg total) by mouth every 6 (six) hours as needed for mild pain (or Fever >/= 101).     acyclovir (ZOVIRAX) 400 MG tablet TAKE 1 TABLET BY MOUTH EVERY DAY 30 tablet 5   ANUCORT-HC 25 MG suppository UNWRAP AND INSERT 1 SUPPOSITORY RECTALLY TWICE A DAY 12 suppository 1   calcium citrate (CALCITRATE - DOSED IN MG ELEMENTAL CALCIUM) 950 (200 Ca) MG tablet Take 200 mg of elemental calcium by mouth daily.     fexofenadine (ALLEGRA ALLERGY) 180 MG tablet Take 1 tablet (180 mg total) by mouth daily. 90 tablet 1   fluticasone (FLONASE) 50 MCG/ACT nasal spray Place 2 sprays into both nostrils daily. 16 g 0   ketoconazole (NIZORAL) 2 % cream Apply 1 application topically daily. Apply on bottom of foot and thin layer between toes 60 g 2   levothyroxine (SYNTHROID) 50 MCG tablet TAKE 1 TABLET BY MOUTH EVERY DAY 30 tablet 5   losartan (COZAAR) 100 MG tablet TAKE 1 TABLET BY MOUTH EVERYDAY AT BEDTIME 30 tablet 2   Multiple Vitamin (MULTIVITAMIN WITH MINERALS) TABS tablet Take 1 tablet by mouth daily.     pantoprazole (PROTONIX) 40 MG tablet TAKE 1 TABLET BY MOUTH EVERY DAY 30 tablet 0   sodium chloride (OCEAN) 0.65 % SOLN nasal spray Place 2 sprays into both nostrils as needed for congestion. 30 mL 2   triamterene-hydrochlorothiazide (DYAZIDE) 37.5-25 MG capsule Take 1 capsule by mouth daily.     No current facility-administered medications on file prior to visit.    Social History   Tobacco Use   Smoking status: Former    Packs/day: 1.50    Years: 24.00    Total pack years: 36.00    Types: Cigarettes    Quit date: 1993    Years since quitting: 30.6   Smokeless tobacco: Never   Tobacco comments:    quit 1995.   Vaping Use   Vaping Use: Never used  Substance Use Topics   Alcohol use: Yes    Alcohol/week: 1.0 standard drink of  alcohol    Types: 1 Glasses of wine per week    Comment: DAILY   Drug use: No    Review of Systems  Constitutional:  Negative for chills and fever.  Respiratory:  Negative for cough and shortness of breath.   Cardiovascular:  Positive for leg swelling. Negative for chest pain and palpitations.  Gastrointestinal:  Negative for nausea and vomiting.  Musculoskeletal:  Positive for back pain.  Neurological:  Positive for numbness (peripheral neuropathy).      Objective:    BP 130/70 (BP Location: Left Arm, Patient Position: Sitting, Cuff Size: Normal)   Pulse 65   Temp 98.8 F (37.1 C) (Oral)   Ht '5\' 1"'$  (1.549 m)   Wt 183 lb (83 kg)   SpO2 97%   BMI 34.58 kg/m    Physical Exam Vitals reviewed.  Constitutional:      Appearance:  She is well-developed.  Eyes:     Conjunctiva/sclera: Conjunctivae normal.  Cardiovascular:     Rate and Rhythm: Normal rate and regular rhythm.     Pulses: Normal pulses.     Heart sounds: Normal heart sounds.     Comments: BLE nonpitting edema around the ankles. No palpable cords or masses. No erythema or increased warmth. No asymmetry in calf size when compared bilaterally LE hair growth symmetric and present. No discoloration or varicosities noted. LE warm and palpable pedal pulses.  Cracked, flaky skin noted heel of right foot.  No purulent discharge  Pulmonary:     Effort: Pulmonary effort is normal.     Breath sounds: Normal breath sounds. No wheezing, rhonchi or rales.  Musculoskeletal:     Lumbar back: No swelling, edema, spasms, tenderness or bony tenderness. Normal range of motion.     Comments: Full range of motion with flexion, tension, lateral side bends. No bony tenderness. No pain, numbness, tingling elicited with single leg raise bilaterally.   Right Hip: No limp or waddling gait. Full ROM with flexion and hip rotation in flexion.    No pain of lateral hip with  (flexion-abduction-external rotation) test.   Slight  pain  bilaterally  with deep palpation of greater trochanter.     Skin:    General: Skin is warm and dry.  Neurological:     Mental Status: She is alert.     Sensory: No sensory deficit.     Deep Tendon Reflexes:     Reflex Scores:      Patellar reflexes are 2+ on the right side and 2+ on the left side.    Comments: Sensation and strength intact bilateral lower extremities.  Psychiatric:        Speech: Speech normal.        Behavior: Behavior normal.        Thought Content: Thought content normal.         Assessment & Plan:   Problem List Items Addressed This Visit       Cardiovascular and Mediastinum   Chronic venous insufficiency   Relevant Medications   triamterene-hydrochlorothiazide (DYAZIDE) 37.5-25 MG capsule   Other Relevant Orders   Basic metabolic panel     Nervous and Auditory   Neuropathy    Chronic , suboptimal control.  Advise she may increase gabapentin to 600 mg every morning, 600 mg in the afternoon and 300 mg at night.  She will monitor for excessive sedation.      Relevant Medications   gabapentin (NEURONTIN) 300 MG capsule     Musculoskeletal and Integument   Tinea pedis    Presentation consistent with tinea pedis. I have sent a message to podiatry, Dr. Posey Pronto in regards to antifungal therapy would recommend.  I would consider clotrimazole ointment        Other   Anxiety and depression - Primary   Relevant Medications   traZODone (DESYREL) 50 MG tablet   Insomnia    Patient previously slept better on trazodone.  Advised her she may stop Zoloft 50 mg and start trazodone 25 to 50 mg nightly      Leg swelling    Scant swelling around bilateral ankles.  It is symmetric.  No symptoms to suggest cellulitis.  Advised patient low-sodium diet and compression stockings are first-line therapy.  She is currently on triamterene hydrochlorothiazide.  We can consider further diuresis if conservative therapy at home is not helpful.  Low back pain     Overall reassuring exam today.  No alarm features at this time and reviewed previous right x-ray and lumbar x-ray with patient today.  Jointly agreed exercise is  such as stationary bike, water aerobics.  Advised that she may use Tylenol arthritis and schedule.  Advised to avoid NSAIDs due to previous history of GI bleed.  She will let me know if pain were to worsen or become more persistent.      Relevant Orders   Ambulatory referral to Orthopedic Surgery      I have discontinued Tonilynn Coatney's sertraline. I have also changed her gabapentin. Additionally, I am having her start on traZODone. Lastly, I am having her maintain her acetaminophen, multivitamin with minerals, calcium citrate, sodium chloride, fluticasone, ketoconazole, fexofenadine, Anucort-HC, pantoprazole, levothyroxine, losartan, acyclovir, and triamterene-hydrochlorothiazide.   Meds ordered this encounter  Medications   gabapentin (NEURONTIN) 300 MG capsule    Sig: Take '600mg'$  qam, '600mg'$  every afternoon and '300mg'$  qhs.    Dispense:  180 capsule    Refill:  3    Order Specific Question:   Supervising Provider    Answer:   Deborra Medina L [[id number]]   traZODone (DESYREL) 50 MG tablet    Sig: Take 0.5-1 tablets (25-50 mg total) by mouth at bedtime as needed for sleep.    Dispense:  90 tablet    Refill:  3    Order Specific Question:   Supervising Provider    Answer:   Crecencio Mc [[id number]]    Return precautions given.   Risks, benefits, and alternatives of the medications and treatment plan prescribed today were discussed, and patient expressed understanding.   Education regarding symptom management and diagnosis given to patient on AVS.  Continue to follow with [name], FNP for routine health maintenance.   Jennifer [name] and I agreed with plan.   [name], FNP

## 2021-08-31 NOTE — Assessment & Plan Note (Signed)
Overall reassuring exam today.  No alarm features at this time and reviewed previous right x-ray and lumbar x-ray with patient today.  Jointly agreed exercise is  such as stationary bike, water aerobics.  Advised that she may use Tylenol arthritis and schedule.  Advised to avoid NSAIDs due to previous history of GI bleed.  She will let me know if pain were to worsen or become more persistent.

## 2021-08-31 NOTE — Assessment & Plan Note (Signed)
Scant swelling around bilateral ankles.  It is symmetric.  No symptoms to suggest cellulitis.  Advised patient low-sodium diet and compression stockings are first-line therapy.  She is currently on triamterene hydrochlorothiazide.  We can consider further diuresis if conservative therapy at home is not helpful.

## 2021-08-31 NOTE — Patient Instructions (Addendum)
As discussed, let's start by scheduling Tylenol Arthritis which is a '650mg'$  tablet .   You may take 1-2 tablets every 8 hours ( scheduled) with maximum of 6 tablets per day.   For example , you could take two tablets in the morning ( 8am) and then two tablets again at 4pm.   Maximum daily dose of acetaminophen 4 g per day from all sources.  If you are taking another medication which includes acetaminophen (Tylenol) which may be in cough and cold preparations or pain medication such as Percocet, you will need to factor that into your total daily dose to be safe.  Please let me know if any questions   Stop Zoloft.  You may start trazodone in its place. Increase gabapentin.  For leg swelling, as discussed, please continue with elevation, please start compression stockings.  If no improvement I can increase triamterene hydrochlorothiazide

## 2021-08-31 NOTE — Assessment & Plan Note (Signed)
Chronic , suboptimal control.  Advise she may increase gabapentin to 600 mg every morning, 600 mg in the afternoon and 300 mg at night.  She will monitor for excessive sedation.

## 2021-08-31 NOTE — Assessment & Plan Note (Signed)
Patient previously slept better on trazodone.  Advised her she may stop Zoloft 50 mg and start trazodone 25 to 50 mg nightly

## 2021-09-01 NOTE — Telephone Encounter (Signed)
Patient states she is returning Jenate Martinique, CMA's call.  I read Jenate's note to patient and scheduled her for a 2-week follow-up with [name], NP.

## 2021-09-03 ENCOUNTER — Inpatient Hospital Stay: Payer: Medicare Other

## 2021-09-03 VITALS — BP 125/60 | HR 62 | Temp 96.5°F | Resp 18

## 2021-09-03 DIAGNOSIS — K649 Unspecified hemorrhoids: Secondary | ICD-10-CM | POA: Diagnosis not present

## 2021-09-03 DIAGNOSIS — R5383 Other fatigue: Secondary | ICD-10-CM | POA: Diagnosis not present

## 2021-09-03 DIAGNOSIS — D500 Iron deficiency anemia secondary to blood loss (chronic): Secondary | ICD-10-CM

## 2021-09-03 DIAGNOSIS — D62 Acute posthemorrhagic anemia: Secondary | ICD-10-CM

## 2021-09-03 DIAGNOSIS — Z79899 Other long term (current) drug therapy: Secondary | ICD-10-CM | POA: Diagnosis not present

## 2021-09-03 DIAGNOSIS — E039 Hypothyroidism, unspecified: Secondary | ICD-10-CM | POA: Diagnosis not present

## 2021-09-03 DIAGNOSIS — Z87891 Personal history of nicotine dependence: Secondary | ICD-10-CM | POA: Diagnosis not present

## 2021-09-03 MED ORDER — SODIUM CHLORIDE 0.9 % IV SOLN
Freq: Once | INTRAVENOUS | Status: AC
  Filled 2021-09-03: qty 250

## 2021-09-03 MED ORDER — SODIUM CHLORIDE 0.9 % IV SOLN
200.0000 mg | Freq: Once | INTRAVENOUS | Status: AC
  Administered 2021-09-03: 200 mg via INTRAVENOUS
  Filled 2021-09-03: qty 200

## 2021-09-07 ENCOUNTER — Telehealth: Payer: Self-pay | Admitting: Family

## 2021-09-07 ENCOUNTER — Encounter: Payer: Self-pay | Admitting: Gastroenterology

## 2021-09-07 ENCOUNTER — Ambulatory Visit (INDEPENDENT_AMBULATORY_CARE_PROVIDER_SITE_OTHER): Payer: Medicare Other | Admitting: Gastroenterology

## 2021-09-07 VITALS — BP 132/73 | HR 75 | Temp 98.9°F | Ht 61.0 in | Wt 182.5 lb

## 2021-09-07 DIAGNOSIS — R1310 Dysphagia, unspecified: Secondary | ICD-10-CM

## 2021-09-07 DIAGNOSIS — K602 Anal fissure, unspecified: Secondary | ICD-10-CM

## 2021-09-07 NOTE — Patient Instructions (Addendum)
We will be sending your prescription to [facility]  Address: [address] [place], [place] [address] Phone: [phone]  For your Barium Swallow test, you will need to show up at the [facility] at 10:30 AM. Please do not eat or drink 3 hours prior.

## 2021-09-07 NOTE — Progress Notes (Signed)
Jonathon Bellows MD, MRCP(U.K) [address]  [place], [place] [address]  Main: [phone]  Fax: [phone]   Primary Care Physician: [name], FNP  Primary Gastroenterologist:  Dr. Jonathon Bellows   Chief Complaint  Patient presents with   Hemorrhoids    HPI: Jennifer [name] is a 71 y.o. female   Summary of history :  History of rectal bleeding admitted to the hospital in January 2023, colonoscopy in 2021 showed hemorrhoids polyps were resected.  History of gastric sleeve surgery and duodenal switch.  EGD in November 2022 showed postsurgical changes but no strictures.  She has undergone banding of hemorrhoids first round in January 2020 3-second round in February 2023 in June 2023 saw Dr. Marius Ditch after hospitalization in May 2023 with severe acute blood loss anemia.  At her last visit with Dr. Marius Ditch in June 2023 she was having rectal bleeding with every bowel movement.  Video capsule study in May 2023 shows no active bleeding.  She had her last column of hemorrhoids banded  Interval history   07/06/2021-09/07/2021  Urgent visit today for discomfort in the perianal area denies any rectal bleeding doing well after last session of banding. Some complaints of difficulty swallowing since the last visit feels that the food is stuck in the upper part of her throat.  No abnormality noted with the passage of the capsule in May 2023    Current Outpatient Medications  Medication Sig Dispense Refill   acetaminophen (TYLENOL) 325 MG tablet Take 2 tablets (650 mg total) by mouth every 6 (six) hours as needed for mild pain (or Fever >/= 101).     acyclovir (ZOVIRAX) 400 MG tablet TAKE 1 TABLET BY MOUTH EVERY DAY 30 tablet 5   ANUCORT-HC 25 MG suppository UNWRAP AND INSERT 1 SUPPOSITORY RECTALLY TWICE A DAY 12 suppository 1   calcium citrate (CALCITRATE - DOSED IN MG ELEMENTAL CALCIUM) 950 (200 Ca) MG tablet Take 200 mg of elemental calcium by mouth daily.     clotrimazole  (LOTRIMIN) 1 % cream Apply 1 Application topically 2 (two) times daily. To right foot 30 g 1   fexofenadine (ALLEGRA ALLERGY) 180 MG tablet Take 1 tablet (180 mg total) by mouth daily. 90 tablet 1   fluticasone (FLONASE) 50 MCG/ACT nasal spray Place 2 sprays into both nostrils daily. 16 g 0   gabapentin (NEURONTIN) 300 MG capsule Take '600mg'$  qam, '600mg'$  every afternoon and '300mg'$  qhs. 180 capsule 3   ketoconazole (NIZORAL) 2 % cream Apply 1 application topically daily. Apply on bottom of foot and thin layer between toes 60 g 2   levothyroxine (SYNTHROID) 50 MCG tablet TAKE 1 TABLET BY MOUTH EVERY DAY 30 tablet 5   losartan (COZAAR) 100 MG tablet TAKE 1 TABLET BY MOUTH EVERYDAY AT BEDTIME 30 tablet 2   Multiple Vitamin (MULTIVITAMIN WITH MINERALS) TABS tablet Take 1 tablet by mouth daily.     pantoprazole (PROTONIX) 40 MG tablet TAKE 1 TABLET BY MOUTH EVERY DAY 30 tablet 0   sodium chloride (OCEAN) 0.65 % SOLN nasal spray Place 2 sprays into both nostrils as needed for congestion. 30 mL 2   traZODone (DESYREL) 50 MG tablet Take 0.5-1 tablets (25-50 mg total) by mouth at bedtime as needed for sleep. 90 tablet 3   triamterene-hydrochlorothiazide (DYAZIDE) 37.5-25 MG capsule Take 1 capsule by mouth daily.     No current facility-administered medications for this visit.    Allergies as of 09/07/2021 - Review Complete  09/07/2021  Allergen Reaction Noted   Celecoxib Hives 07/25/2016   Hydrocodone Nausea Only 06/02/2020   Pollen extract     Sodium ferric gluconate [ferrous gluconate]  11/20/2020   Nasacort [triamcinolone] Other (See Comments) 12/07/2015   Vicodin [hydrocodone-acetaminophen] Nausea Only 09/03/2014    ROS:  General: Negative for anorexia, weight loss, fever, chills, fatigue, weakness. ENT: Negative for hoarseness, difficulty swallowing , nasal congestion. CV: Negative for chest pain, angina, palpitations, dyspnea on exertion, peripheral edema.  Respiratory: Negative for dyspnea at  rest, dyspnea on exertion, cough, sputum, wheezing.  GI: See history of present illness. GU:  Negative for dysuria, hematuria, urinary incontinence, urinary frequency, nocturnal urination.  Endo: Negative for unusual weight change.    Physical Examination:   BP 132/73   Pulse 75   Temp 98.9 F (37.2 C) (Oral)   Ht '5\' 1"'$  (1.549 m)   Wt 182 lb 8 oz (82.8 kg)   BMI 34.48 kg/m   General: Well-nourished, well-developed in no acute distress.  Rectal exam chaperone in the room present externally no abnormality seen in the anal area or introduction of my index finger tenderness felt in the posterior aspect of the anus suggestive of an anal fissure   Imaging Studies: No results found.  Assessment and Plan:   Jennifer [name] is a 71 y.o. y/o female here to see me for perianal discomfort denies any rectal bleeding since the last banding.  Examination suggestive of a posterior anal fissure.  She also has some complaints of dysphagia.  Denies any constipation.  Plan 1.  Compound nifedipine treatment for the perianal area will be prescribed 2.  Barium swallow with tablet for dysphagia     Dr Jonathon Bellows  MD,MRCP [facility]) Follow up in 2 to 3 months

## 2021-09-07 NOTE — Telephone Encounter (Signed)
Copied from [place] [id number]. Topic: Medicare AWV >> Sep 07, 2021  2:38 PM Devoria Glassing wrote: Reason for CRM: Left message for patient to schedule Annual Wellness Visit.  Please schedule with Nurse Health Advisor Denisa O'Brien-Blaney, LPN at [facility]. This appt can be telephone or office visit.  Please call [phone] ask for [facility]

## 2021-09-08 DIAGNOSIS — H2513 Age-related nuclear cataract, bilateral: Secondary | ICD-10-CM | POA: Diagnosis not present

## 2021-09-09 ENCOUNTER — Ambulatory Visit: Payer: Medicare Other | Admitting: Pulmonary Disease

## 2021-09-10 ENCOUNTER — Inpatient Hospital Stay: Payer: Medicare Other

## 2021-09-10 VITALS — BP 113/59 | HR 62 | Temp 97.2°F | Resp 20

## 2021-09-10 DIAGNOSIS — R5383 Other fatigue: Secondary | ICD-10-CM | POA: Diagnosis not present

## 2021-09-10 DIAGNOSIS — E039 Hypothyroidism, unspecified: Secondary | ICD-10-CM | POA: Diagnosis not present

## 2021-09-10 DIAGNOSIS — D500 Iron deficiency anemia secondary to blood loss (chronic): Secondary | ICD-10-CM

## 2021-09-10 DIAGNOSIS — Z79899 Other long term (current) drug therapy: Secondary | ICD-10-CM | POA: Diagnosis not present

## 2021-09-10 DIAGNOSIS — K649 Unspecified hemorrhoids: Secondary | ICD-10-CM | POA: Diagnosis not present

## 2021-09-10 DIAGNOSIS — Z87891 Personal history of nicotine dependence: Secondary | ICD-10-CM | POA: Diagnosis not present

## 2021-09-10 DIAGNOSIS — D62 Acute posthemorrhagic anemia: Secondary | ICD-10-CM

## 2021-09-10 MED ORDER — SODIUM CHLORIDE 0.9 % IV SOLN
INTRAVENOUS | Status: DC
  Filled 2021-09-10: qty 250

## 2021-09-10 MED ORDER — SODIUM CHLORIDE 0.9 % IV SOLN
200.0000 mg | Freq: Once | INTRAVENOUS | Status: AC
  Administered 2021-09-10: 200 mg via INTRAVENOUS
  Filled 2021-09-10: qty 200

## 2021-09-13 ENCOUNTER — Other Ambulatory Visit: Payer: Self-pay | Admitting: Family

## 2021-09-13 DIAGNOSIS — I10 Essential (primary) hypertension: Secondary | ICD-10-CM

## 2021-09-14 ENCOUNTER — Ambulatory Visit (INDEPENDENT_AMBULATORY_CARE_PROVIDER_SITE_OTHER): Payer: Medicare Other | Admitting: Podiatry

## 2021-09-14 ENCOUNTER — Ambulatory Visit: Payer: Medicare Other | Admitting: Gastroenterology

## 2021-09-14 DIAGNOSIS — S90421A Blister (nonthermal), right great toe, initial encounter: Secondary | ICD-10-CM

## 2021-09-14 MED ORDER — LIDOCAINE 5 % EX OINT: 1.0000 | TOPICAL_OINTMENT | CUTANEOUS | 0 refills | Status: DC | PRN

## 2021-09-14 NOTE — Progress Notes (Signed)
Subjective:  Patient ID: Jennifer [name], female    DOB: 12/12/1950,  MRN: [id number]  Chief Complaint  Patient presents with   Toe Pain    Rt hallux blister     71 y.o. female presents with the above complaint.  Patient presents with a new complaint of right hallux friction blister.  Patient states that she may have worn a different type of shoes that may have caused a blister formation.  She states it rubs against the concrete and then it started causing bleeding.  She went to get evaluated she is a diabetic she does not want lose the toe.  She denies any other acute complaints.  She has had previous heel ulcers which seem to have resolved.   Review of Systems: Negative except as noted in the HPI. Denies N/V/F/Ch.  Past Medical History:  Diagnosis Date   Anemia    Anxiety    Bariatric surgery status    Constipation    COVID-19    Dysrhythmia    Elevated liver enzymes    GERD (gastroesophageal reflux disease)    Hemorrhoids    Herpes genitalis    High cholesterol    Hyperlipidemia    Hypertension    Hypothyroidism    IDA (iron deficiency anemia) 02/09/2021   Neuropathy    Osteoarthritis    Sleep apnea     Current Outpatient Medications:    lidocaine (XYLOCAINE) 5 % ointment, Apply 1 Application topically as needed., Disp: 35.44 g, Rfl: 0   acetaminophen (TYLENOL) 325 MG tablet, Take 2 tablets (650 mg total) by mouth every 6 (six) hours as needed for mild pain (or Fever >/= 101)., Disp: , Rfl:    acyclovir (ZOVIRAX) 400 MG tablet, TAKE 1 TABLET BY MOUTH EVERY DAY, Disp: 30 tablet, Rfl: 5   ANUCORT-HC 25 MG suppository, UNWRAP AND INSERT 1 SUPPOSITORY RECTALLY TWICE A DAY, Disp: 12 suppository, Rfl: 1   calcium citrate (CALCITRATE - DOSED IN MG ELEMENTAL CALCIUM) 950 (200 Ca) MG tablet, Take 200 mg of elemental calcium by mouth daily., Disp: , Rfl:    clotrimazole (LOTRIMIN) 1 % cream, Apply 1 Application topically 2 (two) times daily. To right foot, Disp: 30 g, Rfl: 1    fexofenadine (ALLEGRA ALLERGY) 180 MG tablet, Take 1 tablet (180 mg total) by mouth daily., Disp: 90 tablet, Rfl: 1   fluticasone (FLONASE) 50 MCG/ACT nasal spray, Place 2 sprays into both nostrils daily., Disp: 16 g, Rfl: 0   gabapentin (NEURONTIN) 300 MG capsule, Take '600mg'$  qam, '600mg'$  every afternoon and '300mg'$  qhs., Disp: 180 capsule, Rfl: 3   ketoconazole (NIZORAL) 2 % cream, Apply 1 application topically daily. Apply on bottom of foot and thin layer between toes, Disp: 60 g, Rfl: 2   levothyroxine (SYNTHROID) 50 MCG tablet, TAKE 1 TABLET BY MOUTH EVERY DAY, Disp: 30 tablet, Rfl: 5   losartan (COZAAR) 100 MG tablet, TAKE 1 TABLET BY MOUTH EVERYDAY AT BEDTIME, Disp: 30 tablet, Rfl: 2   Multiple Vitamin (MULTIVITAMIN WITH MINERALS) TABS tablet, Take 1 tablet by mouth daily., Disp: , Rfl:    pantoprazole (PROTONIX) 40 MG tablet, TAKE 1 TABLET BY MOUTH EVERY DAY, Disp: 30 tablet, Rfl: 0   sodium chloride (OCEAN) 0.65 % SOLN nasal spray, Place 2 sprays into both nostrils as needed for congestion., Disp: 30 mL, Rfl: 2   traZODone (DESYREL) 50 MG tablet, Take 0.5-1 tablets (25-50 mg total) by mouth at bedtime as needed for sleep., Disp: 90 tablet, Rfl: 3  triamterene-hydrochlorothiazide (DYAZIDE) 37.5-25 MG capsule, TAKE 1 EACH (1 CAPSULE TOTAL) BY MOUTH DAILY., Disp: 30 capsule, Rfl: 5  Social History   Tobacco Use  Smoking Status Former   Packs/day: 1.50   Years: 24.00   Total pack years: 36.00   Types: Cigarettes   Quit date: 1993   Years since quitting: 30.6  Smokeless Tobacco Never  Tobacco Comments   quit 1995.     Allergies  Allergen Reactions   Celecoxib Hives   Hydrocodone Nausea Only   Pollen Extract    Sodium Ferric Gluconate [Ferrous Gluconate]     IV ferric gluconate - shortly after the infusion developed back pain shooting into left arm and bilateral hand swelling   Nasacort [Triamcinolone] Other (See Comments)    Nasal - Nose Bleeds   Vicodin  [Hydrocodone-Acetaminophen] Nausea Only   Objective:  There were no vitals filed for this visit. There is no height or weight on file to calculate BMI. Constitutional Well developed. Well nourished.  Vascular Dorsalis pedis pulses palpable bilaterally. Posterior tibial pulses palpable bilaterally. Capillary refill normal to all digits.  No cyanosis or clubbing noted. Pedal hair growth normal.  Neurologic Normal speech. Oriented to person, place, and time. Epicritic sensation to light touch grossly present bilaterally.  Dermatologic Friction blister noted to right hallux.  Hemorrhagic blister noted.  No underlying infection noted.  Pink granular skin noted underneath.  No deeper injury noted.  Orthopedic: Normal joint ROM without pain or crepitus bilaterally. No visible deformities. No bony tenderness.   Radiographs: None Assessment:   1. Blister of great toe of right foot, initial encounter    Plan:  Patient was evaluated and treated and all questions answered.  Right hallux friction blister -All questions were discussed with the patient in extensive detail. -I will see discussed with her shoe gear modification and she states understanding will work on that. -Betadine wet-to-dry dressing was applied and I have asked her to do once a day.  It will take about 6 weeks to resolve.  If it continues to get worse have asked her to come back and see me.  She states understanding.  No follow-ups on file.

## 2021-09-15 ENCOUNTER — Encounter: Payer: Self-pay | Admitting: Family

## 2021-09-15 ENCOUNTER — Ambulatory Visit (INDEPENDENT_AMBULATORY_CARE_PROVIDER_SITE_OTHER): Payer: Medicare Other | Admitting: Family

## 2021-09-15 VITALS — BP 132/62 | HR 71 | Temp 98.0°F | Ht 61.0 in | Wt 182.6 lb

## 2021-09-15 DIAGNOSIS — G8929 Other chronic pain: Secondary | ICD-10-CM | POA: Diagnosis not present

## 2021-09-15 DIAGNOSIS — K649 Unspecified hemorrhoids: Secondary | ICD-10-CM

## 2021-09-15 DIAGNOSIS — G4700 Insomnia, unspecified: Secondary | ICD-10-CM

## 2021-09-15 DIAGNOSIS — G894 Chronic pain syndrome: Secondary | ICD-10-CM | POA: Diagnosis not present

## 2021-09-15 DIAGNOSIS — M545 Low back pain, unspecified: Secondary | ICD-10-CM

## 2021-09-15 DIAGNOSIS — D649 Anemia, unspecified: Secondary | ICD-10-CM | POA: Diagnosis not present

## 2021-09-15 MED ORDER — HYDROCORTISONE ACETATE 25 MG RE SUPP: RECTAL | 2 refills | Status: DC

## 2021-09-15 MED ORDER — DULOXETINE HCL 30 MG PO CPEP: 30.0000 mg | ORAL_CAPSULE | Freq: Every day | ORAL | 1 refills | Status: DC

## 2021-09-15 NOTE — Progress Notes (Signed)
Subjective:    Patient ID: Jennifer [name], female    DOB: [birth year], 71 y.o.   MRN: [id number]  CC: Jennifer [name] is a 71 y.o. female who presents today for follow up.   HPI: Chronic low back and right hip pain continues to bother her. She was previously recommended to have PT and injections, unfortunately medicare did not approve per patient.   She is taking tylenol arthritis 2 tablets BID. She has not started pool or walking program however still intends too.   continues to experience anal 'discomfort' when she is having a BM.  Requesting suppository as nifepine ointment is not helpfing. Bowels are stoft and not straining. She had seen dark red maroon color on the inside in the stool.   No BRB on toliet paper      Referral to orthopedic for low back pain , however no appointment at this time however she thinks she missed their phone call.   Insomnia-resume trazodone 50 mg without improvement of sleep.  She is no longer on Zoloft    seen 08/18/2021 for anal fissure, dysphagia Dr. [name] No rectal bleeding.  Based on exam, posterior annular fissure.  Provided compounded nifedipine treatment.  Ordered barium swallow which is scheduled for tomorrow   HISTORY:  Past Medical History:  Diagnosis Date   Anemia    Anxiety    Bariatric surgery status    Constipation    COVID-19    Dysrhythmia    Elevated liver enzymes    GERD (gastroesophageal reflux disease)    Hemorrhoids    Herpes genitalis    High cholesterol    Hyperlipidemia    Hypertension    Hypothyroidism    IDA (iron deficiency anemia) 02/09/2021   Neuropathy    Osteoarthritis    Sleep apnea    Past Surgical History:  Procedure Laterality Date   ABDOMINAL HYSTERECTOMY     total for fibroids no h/o abnormal pap   bariatric sleeve  2015   BREAST EXCISIONAL BIOPSY Left 1998   carpal tunnel repair     COLONOSCOPY WITH PROPOFOL N/A 02/11/2016   Procedure: COLONOSCOPY WITH PROPOFOL;  Surgeon: Jonathon Bellows, MD;   Location: [facility] ENDOSCOPY;  Service: Endoscopy;  Laterality: N/A;   COLONOSCOPY WITH PROPOFOL N/A 10/01/2019   Procedure: COLONOSCOPY WITH PROPOFOL;  Surgeon: Jonathon Bellows, MD;  Location: [facility] ENDOSCOPY;  Service: Gastroenterology;  Laterality: N/A;   COLONOSCOPY WITH PROPOFOL N/A 11/19/2020   Procedure: COLONOSCOPY WITH PROPOFOL;  Surgeon: Jonathon Bellows, MD;  Location: [facility] ENDOSCOPY;  Service: Gastroenterology;  Laterality: N/A;   ESOPHAGOGASTRODUODENOSCOPY (EGD) WITH PROPOFOL N/A 12/02/2019   Procedure: ESOPHAGOGASTRODUODENOSCOPY (EGD) WITH PROPOFOL;  Surgeon: Jonathon Bellows, MD;  Location: [facility] ENDOSCOPY;  Service: Gastroenterology;  Laterality: N/A;   ESOPHAGOGASTRODUODENOSCOPY (EGD) WITH PROPOFOL N/A 11/19/2020   Procedure: ESOPHAGOGASTRODUODENOSCOPY (EGD) WITH PROPOFOL;  Surgeon: Jonathon Bellows, MD;  Location: [facility] ENDOSCOPY;  Service: Gastroenterology;  Laterality: N/A;   FLEXIBLE SIGMOIDOSCOPY N/A 02/06/2021   Procedure: FLEXIBLE SIGMOIDOSCOPY;  Surgeon: [name], DO;  Location: [facility] ENDOSCOPY;  Service: Gastroenterology;  Laterality: N/A;   GIVENS CAPSULE STUDY N/A 06/07/2021   Procedure: GIVENS CAPSULE STUDY;  Surgeon: Lin Landsman, MD;  Location: [facility] ENDOSCOPY;  Service: Gastroenterology;  Laterality: N/A;   GIVENS CAPSULE STUDY N/A 06/09/2021   Procedure: GIVENS CAPSULE STUDY;  Surgeon: Lin Landsman, MD;  Location: [facility] ENDOSCOPY;  Service: Gastroenterology;  Laterality: N/A;   HEMORRHOID SURGERY     LAPAROSCOPIC GASTRIC RESTRICTIVE DUODENAL PROCEDURE (DUODENAL SWITCH) Bilateral  2020   Family History  Problem Relation Age of Onset   Breast cancer Sister 23       materal 1/2 sister   Hypertension Sister    Hypertension Mother    Heart disease Father    Hypertension Brother     Allergies: Celecoxib, Hydrocodone, Pollen extract, Sodium ferric gluconate [ferrous gluconate], Nasacort [triamcinolone], and Vicodin [hydrocodone-acetaminophen] Current Outpatient  Medications on File Prior to Visit  Medication Sig Dispense Refill   acetaminophen (TYLENOL) 325 MG tablet Take 2 tablets (650 mg total) by mouth every 6 (six) hours as needed for mild pain (or Fever >/= 101).     acyclovir (ZOVIRAX) 400 MG tablet TAKE 1 TABLET BY MOUTH EVERY DAY 30 tablet 5   calcium citrate (CALCITRATE - DOSED IN MG ELEMENTAL CALCIUM) 950 (200 Ca) MG tablet Take 200 mg of elemental calcium by mouth daily.     clotrimazole (LOTRIMIN) 1 % cream Apply 1 Application topically 2 (two) times daily. To right foot 30 g 1   fexofenadine (ALLEGRA ALLERGY) 180 MG tablet Take 1 tablet (180 mg total) by mouth daily. 90 tablet 1   fluticasone (FLONASE) 50 MCG/ACT nasal spray Place 2 sprays into both nostrils daily. 16 g 0   gabapentin (NEURONTIN) 300 MG capsule Take '600mg'$  qam, '600mg'$  every afternoon and '300mg'$  qhs. 180 capsule 3   ketoconazole (NIZORAL) 2 % cream Apply 1 application topically daily. Apply on bottom of foot and thin layer between toes 60 g 2   levothyroxine (SYNTHROID) 50 MCG tablet TAKE 1 TABLET BY MOUTH EVERY DAY 30 tablet 5   lidocaine (XYLOCAINE) 5 % ointment Apply 1 Application topically as needed. 35.44 g 0   losartan (COZAAR) 100 MG tablet TAKE 1 TABLET BY MOUTH EVERYDAY AT BEDTIME 30 tablet 2   Multiple Vitamin (MULTIVITAMIN WITH MINERALS) TABS tablet Take 1 tablet by mouth daily.     pantoprazole (PROTONIX) 40 MG tablet TAKE 1 TABLET BY MOUTH EVERY DAY 30 tablet 0   sodium chloride (OCEAN) 0.65 % SOLN nasal spray Place 2 sprays into both nostrils as needed for congestion. 30 mL 2   traZODone (DESYREL) 50 MG tablet Take 0.5-1 tablets (25-50 mg total) by mouth at bedtime as needed for sleep. 90 tablet 3   triamterene-hydrochlorothiazide (DYAZIDE) 37.5-25 MG capsule TAKE 1 EACH (1 CAPSULE TOTAL) BY MOUTH DAILY. 30 capsule 5   No current facility-administered medications on file prior to visit.    Social History   Tobacco Use   Smoking status: Former    Packs/day:  1.50    Years: 24.00    Total pack years: 36.00    Types: Cigarettes    Quit date: 1993    Years since quitting: 30.6   Smokeless tobacco: Never   Tobacco comments:    quit 1995.   Vaping Use   Vaping Use: Never used  Substance Use Topics   Alcohol use: Yes    Alcohol/week: 1.0 standard drink of alcohol    Types: 1 Glasses of wine per week    Comment: DAILY   Drug use: No    Review of Systems  Constitutional:  Negative for chills and fever.  Respiratory:  Negative for cough.   Cardiovascular:  Negative for chest pain and palpitations.  Gastrointestinal:  Positive for blood in stool. Negative for anal bleeding, constipation, nausea and vomiting.  Musculoskeletal:  Positive for back pain.      Objective:    BP 132/62 (BP Location: Left Arm, Patient Position:  Sitting, Cuff Size: Normal)   Pulse 71   Temp 98 F (36.7 C) (Oral)   Ht '5\' 1"'$  (1.549 m)   Wt 182 lb 9.6 oz (82.8 kg)   SpO2 96%   BMI 34.50 kg/m  BP Readings from Last 3 Encounters:  09/15/21 132/62  09/10/21 (!) 113/59  09/07/21 132/73   Wt Readings from Last 3 Encounters:  09/15/21 182 lb 9.6 oz (82.8 kg)  09/07/21 182 lb 8 oz (82.8 kg)  08/31/21 183 lb (83 kg)    Physical Exam Vitals reviewed.  Constitutional:      Appearance: She is well-developed.  Eyes:     Conjunctiva/sclera: Conjunctivae normal.  Cardiovascular:     Rate and Rhythm: Normal rate and regular rhythm.     Pulses: Normal pulses.     Heart sounds: Normal heart sounds.  Pulmonary:     Effort: Pulmonary effort is normal.     Breath sounds: Normal breath sounds. No wheezing, rhonchi or rales.  Skin:    General: Skin is warm and dry.  Neurological:     Mental Status: She is alert.  Psychiatric:        Speech: Speech normal.        Behavior: Behavior normal.        Thought Content: Thought content normal.        Assessment & Plan:   Problem List Items Addressed This Visit       Cardiovascular and Mediastinum    Hemorrhoids    Maroon colored stool one week ago. ? Inflamed hemorrhoid.  Colonoscopy UTD. Pending occult stool cards. Refilled anusol for symptom relief.       Relevant Medications   hydrocortisone (ANUCORT-HC) 25 MG suppository   Other Relevant Orders   Fecal occult blood, imunochemical     Other   Chronic pain syndrome - Primary   Relevant Medications   DULoxetine (CYMBALTA) 30 MG capsule   Insomnia    Chronic, suboptimal control. We agreed to remain on trazodone '50mg'$  and trial cymbalta. Encouraged exercise to help with sleep.       Low back pain    Chronic, suboptimal control. She is compliant with tylenol 2 tablets BID. She will [facility] to schedule an appointment. We agreed trial of cymbalta appropriate. Close follow up.       Other Visit Diagnoses     Anemia, unspecified type            I have changed Dimitra Norkus's Anucort-HC to hydrocortisone. I am also having her start on DULoxetine. Additionally, I am having her maintain her acetaminophen, multivitamin with minerals, calcium citrate, sodium chloride, fluticasone, ketoconazole, fexofenadine, pantoprazole, levothyroxine, losartan, acyclovir, gabapentin, traZODone, clotrimazole, triamterene-hydrochlorothiazide, and lidocaine.   Meds ordered this encounter  Medications   DULoxetine (CYMBALTA) 30 MG capsule    Sig: Take 1 capsule (30 mg total) by mouth daily.    Dispense:  90 capsule    Refill:  1    Order Specific Question:   Supervising Provider    Answer:   Deborra Medina L [[id number]]   hydrocortisone (ANUCORT-HC) 25 MG suppository    Sig: UNWRAP AND INSERT 1 SUPPOSITORY RECTALLY TWICE A DAY    Dispense:  12 suppository    Refill:  2    Order Specific Question:   Supervising Provider    Answer:   Crecencio Mc [[id number]]    Return precautions given.   Risks, benefits, and alternatives of the medications and treatment plan prescribed today  were discussed, and patient expressed understanding.    Education regarding symptom management and diagnosis given to patient on AVS.  Continue to follow with [name], FNP for routine health maintenance.   Jennifer [name] and I agreed with plan.   [name], FNP

## 2021-09-15 NOTE — Assessment & Plan Note (Addendum)
Chronic, suboptimal control. She is compliant with tylenol 2 tablets BID. She will [facility] to schedule an appointment. We agreed trial of cymbalta appropriate. Close follow up.

## 2021-09-15 NOTE — Progress Notes (Signed)
Would like suppository again

## 2021-09-15 NOTE — Assessment & Plan Note (Signed)
Maroon colored stool one week ago. ? Inflamed hemorrhoid.  Colonoscopy UTD. Pending occult stool cards. Refilled anusol for symptom relief.

## 2021-09-15 NOTE — Patient Instructions (Addendum)
Please call [facility] to schedule an appointment [phone]  Started Cymbalta 30 mg for chronic pain.  We can increase this medication couple weeks.  Please call me if you would like to do so.   I have also provided you with Anusol suppositories.  Please ensure that you do return stool cards to our office

## 2021-09-15 NOTE — Assessment & Plan Note (Signed)
Chronic, suboptimal control. We agreed to remain on trazodone '50mg'$  and trial cymbalta. Encouraged exercise to help with sleep.

## 2021-09-16 ENCOUNTER — Ambulatory Visit
Admission: RE | Admit: 2021-09-16 | Discharge: 2021-09-16 | Disposition: A | Discharge status: Home / Self Care | Payer: Medicare Other | Source: Ambulatory Visit | Attending: Gastroenterology | Admitting: Gastroenterology

## 2021-09-16 DIAGNOSIS — K224 Dyskinesia of esophagus: Secondary | ICD-10-CM | POA: Diagnosis not present

## 2021-09-16 DIAGNOSIS — R1310 Dysphagia, unspecified: Secondary | ICD-10-CM | POA: Insufficient documentation

## 2021-09-16 DIAGNOSIS — K219 Gastro-esophageal reflux disease without esophagitis: Secondary | ICD-10-CM | POA: Diagnosis not present

## 2021-09-17 LAB — FECAL OCCULT BLOOD, IMMUNOCHEMICAL: Fecal Occult Bld: POSITIVE — AB

## 2021-09-17 NOTE — Progress Notes (Signed)
Coronary abnormalities he has dysmotility of the esophagus by the esophagus is not squeezing adequately very likely secondary to acid reflux Ensure she is taking her PPI twice a day using antireflux measures, wedge pillow.

## 2021-09-17 NOTE — Progress Notes (Signed)
Sorry typo in the previous message I meant the only abnormality seen on the barium study.  "Coronary abnormalities" is an error

## 2021-09-21 ENCOUNTER — Telehealth: Payer: Self-pay

## 2021-09-21 ENCOUNTER — Telehealth: Payer: Self-pay | Admitting: Family

## 2021-09-21 NOTE — Telephone Encounter (Signed)
Was returning patients call but got no answer. LVM to call back

## 2021-09-21 NOTE — Telephone Encounter (Signed)
I spoke to [place] with [name], NP office  Pt left msg with PCP c/o of rectal pain and discomfort from hemorrhoid banding... Requesting that they call our office to request she have a sooner appt... This was after the results of a positive IFOB test from Fri 9/1...   I explained to them it was likely positive due to her Hx of hemorrhoids and she had an appt with [name] x 2 weeks ago that they did not seem to know about  I advised that I would forward msg to Dr Georgeann Oppenheim assistant for further investigation to see if pt needs to return to office sooner and fit her in

## 2021-09-21 NOTE — Telephone Encounter (Signed)
Spoke to patient today in regards to her still being in pain and having rectal burning due to anal fissure and hemorrhoids. Patient stated that she needs to get the bands removed that Dr Wilhemena Durie had placed.I then called Dr Wilhemena Durie office to inquire if they had an earlier appt for her but they stated that she was just there last past 2 weeks and they would give her a call and see what if anything they could do to maybe get her back into the office sooner to assess her.

## 2021-09-21 NOTE — Telephone Encounter (Signed)
Copied from [place] [id number]. Topic: Medicare AWV >> Sep 21, 2021  3:18 PM Devoria Glassing wrote: Reason for CRM: Left message for patient to schedule Annual Wellness Visit.  Please schedule with Nurse Health Advisor Denisa O'Brien-Blaney, LPN at [facility]. This appt can be telephone or office visit.  Please call [phone] ask for [facility]

## 2021-09-22 ENCOUNTER — Other Ambulatory Visit: Payer: Self-pay

## 2021-09-22 MED ORDER — PANTOPRAZOLE SODIUM 40 MG PO TBEC
40.0000 mg | DELAYED_RELEASE_TABLET | Freq: Two times a day (BID) | ORAL | 3 refills | Status: DC
Start: 2021-09-22 — End: 2022-11-25

## 2021-09-22 NOTE — Telephone Encounter (Signed)
//  Called patient back and she stated that she continues to have rectal bleeding and discomfort when she has a bowel movement. Patient stated that her stools are soft and not straining. Patient also stated that she feels like there's a "bubble" in her rectum and that it's causing for her to have rectal bleeding. I asked her if she was still using the Nifedipine cream that Dr. [name] prescribed her and she stated that she is and that she is also using the Anusol that was prescribed by Mrs. Arnette, FNP and that it only helps her for a few minutes and then she starts to be uncomfortable again. Therefore, I recommended for her to be seen by Dr. [name] tomorrow so he could take a look and see what are his recommendations. Patient agreed and she will be here tomorrow  at 1:15 PM. Patient had no further questions.

## 2021-09-23 ENCOUNTER — Ambulatory Visit (INDEPENDENT_AMBULATORY_CARE_PROVIDER_SITE_OTHER): Payer: Medicare Other | Admitting: Gastroenterology

## 2021-09-23 ENCOUNTER — Encounter: Payer: Self-pay | Admitting: Gastroenterology

## 2021-09-23 VITALS — BP 131/67 | HR 76 | Temp 98.7°F | Wt 182.6 lb

## 2021-09-23 DIAGNOSIS — K602 Anal fissure, unspecified: Secondary | ICD-10-CM

## 2021-09-23 DIAGNOSIS — R1310 Dysphagia, unspecified: Secondary | ICD-10-CM

## 2021-09-23 NOTE — Patient Instructions (Signed)
Please start taking 2 capsules up to 3 times a day as needed. Please take 30-90 minutes before breakfast. Just so you know, this could be bought over-the-counter.  Please continue to apply the Nifedipine cream three times a day inside the anus. Then apply the Desitin cream on your rectum to form a barrier.  If you are not feeling any better, please let us know so we could refer you to see a general surgeon to be evaluated.

## 2021-09-23 NOTE — Progress Notes (Signed)
Jonathon Bellows MD, MRCP(U.K) [address]  [place]  [place], [place] [address]  Main: [phone]  Fax: [phone]   Primary Care Physician: [name], FNP  Primary Gastroenterologist:  Dr. Jonathon Bellows   Chief Complaint  Patient presents with   Anal Fissure    HPI: Jennifer [name] is a 71 y.o. female Summary of history :   History of rectal bleeding admitted to the hospital in January 2023, colonoscopy in 2021 showed hemorrhoids polyps were resected.  History of gastric sleeve surgery and duodenal switch.  EGD in November 2022 showed postsurgical changes but no strictures.  She has undergone banding of hemorrhoids first round in January 2020 3-second round in February 2023 in June 2023 saw Dr. Marius Ditch after hospitalization in May 2023 with severe acute blood loss anemia.  At her last visit with Dr. Marius Ditch in June 2023 she was having rectal bleeding with every bowel movement.  Video capsule study in May 2023 shows no active bleeding.  She had her last column of hemorrhoids banded Urgent visit 09/07/2021 for discomfort in the perianal area , also felt  that the food is stuck in the upper part of her throat   Interval history   09/07/2021-09/23/2021   09/16/2021: Barium swallow with tablet: Mild GERD, moderate esophageal dysmotility  Since last visit she has noted on and off some blood in the tissue paper.  She has had burning sensation in the perianal area and some discomfort pain during the procedure defecation denies any constipation.  Current Outpatient Medications  Medication Sig Dispense Refill   acetaminophen (TYLENOL) 325 MG tablet Take 2 tablets (650 mg total) by mouth every 6 (six) hours as needed for mild pain (or Fever >/= 101).     acyclovir (ZOVIRAX) 400 MG tablet TAKE 1 TABLET BY MOUTH EVERY DAY 30 tablet 5   calcium citrate (CALCITRATE - DOSED IN MG ELEMENTAL CALCIUM) 950 (200 Ca) MG tablet Take 200 mg of elemental calcium by mouth daily.     clotrimazole  (LOTRIMIN) 1 % cream Apply 1 Application topically 2 (two) times daily. To right foot 30 g 1   DULoxetine (CYMBALTA) 30 MG capsule Take 1 capsule (30 mg total) by mouth daily. 90 capsule 1   fexofenadine (ALLEGRA ALLERGY) 180 MG tablet Take 1 tablet (180 mg total) by mouth daily. 90 tablet 1   fluticasone (FLONASE) 50 MCG/ACT nasal spray Place 2 sprays into both nostrils daily. 16 g 0   gabapentin (NEURONTIN) 300 MG capsule Take '600mg'$  qam, '600mg'$  every afternoon and '300mg'$  qhs. 180 capsule 3   hydrocortisone (ANUCORT-HC) 25 MG suppository UNWRAP AND INSERT 1 SUPPOSITORY RECTALLY TWICE A DAY 12 suppository 2   ketoconazole (NIZORAL) 2 % cream Apply 1 application topically daily. Apply on bottom of foot and thin layer between toes 60 g 2   levothyroxine (SYNTHROID) 50 MCG tablet TAKE 1 TABLET BY MOUTH EVERY DAY 30 tablet 5   lidocaine (XYLOCAINE) 5 % ointment Apply 1 Application topically as needed. 35.44 g 0   losartan (COZAAR) 100 MG tablet TAKE 1 TABLET BY MOUTH EVERYDAY AT BEDTIME 30 tablet 2   Multiple Vitamin (MULTIVITAMIN WITH MINERALS) TABS tablet Take 1 tablet by mouth daily.     pantoprazole (PROTONIX) 40 MG tablet TAKE 1 TABLET BY MOUTH EVERY DAY 30 tablet 0   pantoprazole (PROTONIX) 40 MG tablet Take 1 tablet (40 mg total) by mouth 2 (two) times daily. 180 tablet 3   sodium chloride (OCEAN) 0.65 %  SOLN nasal spray Place 2 sprays into both nostrils as needed for congestion. 30 mL 2   traZODone (DESYREL) 50 MG tablet Take 0.5-1 tablets (25-50 mg total) by mouth at bedtime as needed for sleep. 90 tablet 3   triamterene-hydrochlorothiazide (DYAZIDE) 37.5-25 MG capsule TAKE 1 EACH (1 CAPSULE TOTAL) BY MOUTH DAILY. 30 capsule 5   No current facility-administered medications for this visit.    Allergies as of 09/23/2021 - Review Complete 09/23/2021  Allergen Reaction Noted   Celecoxib Hives 07/25/2016   Hydrocodone Nausea Only 06/02/2020   Pollen extract     Sodium ferric gluconate [ferrous  gluconate]  11/20/2020   Nasacort [triamcinolone] Other (See Comments) 12/07/2015   Vicodin [hydrocodone-acetaminophen] Nausea Only 09/03/2014    ROS:  General: Negative for anorexia, weight loss, fever, chills, fatigue, weakness. ENT: Negative for hoarseness, difficulty swallowing , nasal congestion. CV: Negative for chest pain, angina, palpitations, dyspnea on exertion, peripheral edema.  Respiratory: Negative for dyspnea at rest, dyspnea on exertion, cough, sputum, wheezing.  GI: See history of present illness. GU:  Negative for dysuria, hematuria, urinary incontinence, urinary frequency, nocturnal urination.  Endo: Negative for unusual weight change.    Physical Examination:   BP 131/67   Pulse 76   Temp 98.7 F (37.1 C) (Oral)   Wt 182 lb 9.6 oz (82.8 kg)   BMI 34.50 kg/m   General: Well-nourished, well-developed in no acute distress.  Eyes: No icterus. Conjunctivae pink. Rectal exam: Chaperone present in the room: Externally no abnormalities noted.  No inflammation.  Internally mild tenderness present in the posterior aspect of the anal canal on try application of pressure.   Imaging Studies: DG ESOPHAGUS W SINGLE CM (SOL OR THIN BA)  Result Date: 09/16/2021 CLINICAL DATA:  Dysphagia.  History of bariatric surgery EXAM: ESOPHOGRAM / BARIUM SWALLOW / BARIUM TABLET STUDY TECHNIQUE: Combined double contrast and single contrast examination performed using effervescent crystals, thick barium liquid, and thin barium liquid. The patient was observed with fluoroscopy swallowing a 13 mm barium sulphate tablet. FLUOROSCOPY TIME:  Radiation Exposure Index (if provided by the fluoroscopic device): 27.5 mGy COMPARISON:  CT 06/06/2021 FINDINGS: Normal swallowing function high cervical esophagus. No mucosal irregularity in the mid esophagus or distal esophagus. GE junction patent. No stricture or mass. Moderate esophageal dysmotility with poor initiation stripping wave and to and fro motion  of the barium bolus. No tertiary contractions. Mild gastroesophageal reflux. No hiatal hernia. Barium tab passed GE junction easily. IMPRESSION: 1. No mucosal irregularity, stricture or mass in the esophagus. 2. Moderate esophageal dysmotility. 3. Mild gastroesophageal reflux. 4. No hiatal hernia. Electronically Signed   By: Suzy Bouchard M.D.   On: 09/16/2021 12:07    Assessment and Plan:   Jennifer [name] is a 71 y.o. y/o female here to see me for perianal discomfort denies any rectal bleeding since the last banding.  Examination suggestive of a posterior anal fissure.  .  Denies any constipation.  At last visit was commenced on nifedipine topical.  Has some persistent anal discomfort during the procedure defecation no constipation.  Tenderness on palpation of the posterior aspect of the anal canal.  I suspect she has persistence of the anal fissure in addition has burning sensation in the perianal area probably due to mild dermatitis.   Plan 1.  Continue compound nifedipine cream, add Desitin topically.  Suggest to try this intervention for another 4 weeks if it does not get better then would require referral to Dr. Hampton Abbot and general  surgery to examine the anal canal under general anesthesia performed and consider injection of Botox.  If the hemorrhoids continue to bleed on and off would require surgical hemorrhoidectomy discussed with her.  She also has some abdominal discomfort in the lower part of her abdomen preceding a bowel movement probably related to spasm we will commence her on IBgard samples will be provided.   Dr Jonathon Bellows  MD,MRCP [facility]) Follow up in as needed

## 2021-09-24 ENCOUNTER — Telehealth: Payer: Self-pay | Admitting: Family

## 2021-09-24 NOTE — Telephone Encounter (Signed)
Noted  She has seen [name] 09/23/21

## 2021-09-24 NOTE — Telephone Encounter (Signed)
-----   Message from Jonathon Bellows, MD sent at 09/18/2021  2:46 PM EDT ----- Hb at baseline and a stool test positive or negative wouldn't change management : a negative test may be just negative on that day and could be positive the day later. In addition stool test has not been FDA approved for anything except for colon cancer screening and hence cannot use the result for any form of interpretation.   IF she has recurent issues from rectal bleeding due to her hemorroids then may need surgical evaluaiton since we have already banded her. She has had full GI eval and felt bleeding due to hemorroids  Regards  Kiran  ----- Message ----- From: [name], FNP Sent: 09/17/2021   4:18 PM EDT To: Jonathon Bellows, MD  [name],   Ms Kock's fecal occult is positive.  I wouldn't normally ordered occult stool as patient has had recent fissure , hemorrhoids however she was complaining of 'blood inside her stool'  and dark in color versus BRB. I am concerned this is  false positive with her history with hemorrhoids, fissure. Hemoglobin is stable ( 9.1) and she follows with hematology , last IV iron in June. She has had such a thorough evaluation with you  GI capsule, colonoscopy up to date. She is seeing you in November. I am happy to bring her in to see if any further evidence of bleeding from rectum. Does she need to see you any sooner or would another colonoscopy be indicated?

## 2021-09-27 ENCOUNTER — Emergency Department: Payer: Medicare Other

## 2021-09-27 ENCOUNTER — Emergency Department
Admission: EM | Admit: 2021-09-27 | Discharge: 2021-09-27 | Disposition: A | Discharge status: Home / Self Care | Payer: Medicare Other | Attending: Emergency Medicine | Admitting: Emergency Medicine

## 2021-09-27 ENCOUNTER — Other Ambulatory Visit: Payer: Self-pay

## 2021-09-27 ENCOUNTER — Encounter: Payer: Self-pay | Admitting: Emergency Medicine

## 2021-09-27 DIAGNOSIS — S2020XA Contusion of thorax, unspecified, initial encounter: Secondary | ICD-10-CM | POA: Diagnosis not present

## 2021-09-27 DIAGNOSIS — E039 Hypothyroidism, unspecified: Secondary | ICD-10-CM | POA: Diagnosis not present

## 2021-09-27 DIAGNOSIS — R55 Syncope and collapse: Secondary | ICD-10-CM | POA: Diagnosis not present

## 2021-09-27 DIAGNOSIS — S161XXA Strain of muscle, fascia and tendon at neck level, initial encounter: Secondary | ICD-10-CM | POA: Diagnosis not present

## 2021-09-27 DIAGNOSIS — M542 Cervicalgia: Secondary | ICD-10-CM | POA: Diagnosis not present

## 2021-09-27 DIAGNOSIS — I10 Essential (primary) hypertension: Secondary | ICD-10-CM | POA: Insufficient documentation

## 2021-09-27 DIAGNOSIS — W19XXXA Unspecified fall, initial encounter: Secondary | ICD-10-CM | POA: Diagnosis not present

## 2021-09-27 DIAGNOSIS — R0781 Pleurodynia: Secondary | ICD-10-CM | POA: Diagnosis not present

## 2021-09-27 DIAGNOSIS — S20211A Contusion of right front wall of thorax, initial encounter: Secondary | ICD-10-CM | POA: Diagnosis not present

## 2021-09-27 DIAGNOSIS — S0990XA Unspecified injury of head, initial encounter: Secondary | ICD-10-CM

## 2021-09-27 DIAGNOSIS — M25511 Pain in right shoulder: Secondary | ICD-10-CM | POA: Insufficient documentation

## 2021-09-27 NOTE — ED Notes (Signed)
See triage note   Presents s/p fall couple of days ago

## 2021-09-27 NOTE — ED Triage Notes (Signed)
Pt via POV from home. Pt c/o mechanical fall on Saturday, pt has a hx of neuropathy and loss her balance. Pt hit the back of her head. Denies blood thinners. Denies LOC. States that headache, R shoulder and arm pain, and R side pain. Pt is A&OX4 and NAD.

## 2021-09-27 NOTE — ED Provider Notes (Signed)
[facility] Provider Note    Event Date/Time   First MD Initiated Contact with Patient 09/27/21 1106     (approximate)   History   Fall   HPI  Jennifer [name] is a 71 y.o. female with history of hypertension, hyper lipidemia, hypothyroidism, osteoarthritis and neuropathy presents emergency department with complaints of a fall on Saturday.  Patient states she fell and hit her back of her head on a nightstand, continues to have headache and neck pain.  Also has right shoulder and right rib pain.  Pain at the ribs is worse with a deep breath.  No LOC.  No vomiting.  No chest pain or shortness of breath      Physical Exam   Triage Vital Signs: ED Triage Vitals  Enc Vitals Group     BP 09/27/21 1027 124/70     Pulse Rate 09/27/21 1027 65     Resp 09/27/21 1027 20     Temp 09/27/21 1027 98.4 F (36.9 C)     Temp src --      SpO2 09/27/21 1027 99 %     Weight 09/27/21 1026 183 lb (83 kg)     Height 09/27/21 1026 '5\' 1"'$  (1.549 m)     Head Circumference --      Peak Flow --      Pain Score 09/27/21 1026 6     Pain Loc --      Pain Edu? --      Excl. in [place]? --     Most recent vital signs: Vitals:   09/27/21 1027  BP: 124/70  Pulse: 65  Resp: 20  Temp: 98.4 F (36.9 C)  SpO2: 99%     General: Awake, no distress.   CV:  Good peripheral perfusion. regular rate and  rhythm Resp:  Normal effort. Lungs CTA, right ribs tender Abd:  No distention.   Other:  Right shoulder tender to palpation, back of skull is tender to palpation, C-spine tender to palpation, cranial nerves II through XII grossly intact, no pronator drift   ED Results / Procedures / Treatments   Labs (all labs ordered are listed, but only abnormal results are displayed) Labs Reviewed - No data to display   EKG     RADIOLOGY CT of the head, C-spine X-ray of the right shoulder, and right ribs    PROCEDURES:   Procedures   MEDICATIONS ORDERED IN  ED: Medications - No data to display   IMPRESSION / MDM / [place] / ED COURSE  I reviewed the triage vital signs and the nursing notes.                              Differential diagnosis includes, but is not limited to, subdural, SAH, C-spine fracture, rib fracture, shoulder fracture, sprain and contusions  Patient's presentation is most consistent with acute presentation with potential threat to life or bodily function.   CT of the head, C-spine, x-ray right shoulder and x-ray of the right ribs ordered patient appears to be stable   CT of the head and C-spine independently reviewed and interpreted by me as being negative for any acute abnormality.  X-ray of the right ribs and right shoulder independently reviewed and interpreted by me as being negative.  Patient appears to be very stable.  Do not feel that she needs admission.  She is to follow-up with her regular doctor  if not improving to 3 days.  Strict instructions to return if worsening.  Patient is in agreement treatment plan.  Discharged stable condition.   FINAL CLINICAL IMPRESSION(S) / ED DIAGNOSES   Final diagnoses:  Fall, initial encounter  Minor head injury, initial encounter  Strain of neck muscle, initial encounter  Chest wall contusion, right, initial encounter     Rx / DC Orders   ED Discharge Orders     None        Note:  This document was prepared using Dragon voice recognition software and may include unintentional dictation errors.    Versie Starks, PA-C 09/27/21 1241    Blake Divine, MD 09/27/21 [phone]

## 2021-09-28 ENCOUNTER — Ambulatory Visit (INDEPENDENT_AMBULATORY_CARE_PROVIDER_SITE_OTHER): Payer: Medicare Other

## 2021-09-28 VITALS — Ht 61.0 in | Wt 183.0 lb

## 2021-09-28 DIAGNOSIS — Z0000 Encounter for general adult medical examination without abnormal findings: Secondary | ICD-10-CM

## 2021-09-28 NOTE — Progress Notes (Signed)
Subjective:   Jennifer [name] is a 71 y.o. female who presents for Medicare Annual (Subsequent) preventive examination.  Review of Systems    No ROS.  Medicare Wellness Virtual Visit.  Visual/audio telehealth visit, UTA vital signs.   See social history for additional risk factors.  Cardiac Risk Factors include: advanced age (>75mn, >>27women)     Objective:    Today's Vitals   09/28/21 1343  Weight: 183 lb (83 kg)  Height: '5\' 1"'$  (1.549 m)   Body mass index is 34.58 kg/m.     09/28/2021    1:46 PM 09/27/2021   10:26 AM 09/03/2021    9:00 AM 06/17/2021    1:00 PM 06/06/2021    9:30 PM 06/06/2021    5:35 PM 05/13/2021    1:29 PM  Advanced Directives  Does Patient Have a Medical Advance Directive? Yes Yes No No No No No  Type of AParamedicof [place]Living will [place]      Does patient want to make changes to medical advance directive? No - Patient declined  No - Patient declined No - Patient declined     Copy of [place] Chart? No - copy requested        Would patient like information on creating a medical advance directive?     No - Patient declined      Current Medications (verified) Outpatient Encounter Medications as of 09/28/2021  Medication Sig   acetaminophen (TYLENOL) 325 MG tablet Take 2 tablets (650 mg total) by mouth every 6 (six) hours as needed for mild pain (or Fever >/= 101).   acyclovir (ZOVIRAX) 400 MG tablet TAKE 1 TABLET BY MOUTH EVERY DAY   calcium citrate (CALCITRATE - DOSED IN MG ELEMENTAL CALCIUM) 950 (200 Ca) MG tablet Take 200 mg of elemental calcium by mouth daily.   clotrimazole (LOTRIMIN) 1 % cream Apply 1 Application topically 2 (two) times daily. To right foot   DULoxetine (CYMBALTA) 30 MG capsule Take 1 capsule (30 mg total) by mouth daily.   fexofenadine (ALLEGRA ALLERGY) 180 MG tablet Take 1 tablet (180 mg total) by mouth daily.   fluticasone (FLONASE) 50 MCG/ACT nasal  spray Place 2 sprays into both nostrils daily.   gabapentin (NEURONTIN) 300 MG capsule Take '600mg'$  qam, '600mg'$  every afternoon and '300mg'$  qhs.   hydrocortisone (ANUCORT-HC) 25 MG suppository UNWRAP AND INSERT 1 SUPPOSITORY RECTALLY TWICE A DAY   ketoconazole (NIZORAL) 2 % cream Apply 1 application topically daily. Apply on bottom of foot and thin layer between toes   levothyroxine (SYNTHROID) 50 MCG tablet TAKE 1 TABLET BY MOUTH EVERY DAY   lidocaine (XYLOCAINE) 5 % ointment Apply 1 Application topically as needed.   losartan (COZAAR) 100 MG tablet TAKE 1 TABLET BY MOUTH EVERYDAY AT BEDTIME   Multiple Vitamin (MULTIVITAMIN WITH MINERALS) TABS tablet Take 1 tablet by mouth daily.   pantoprazole (PROTONIX) 40 MG tablet TAKE 1 TABLET BY MOUTH EVERY DAY   pantoprazole (PROTONIX) 40 MG tablet Take 1 tablet (40 mg total) by mouth 2 (two) times daily.   sodium chloride (OCEAN) 0.65 % SOLN nasal spray Place 2 sprays into both nostrils as needed for congestion.   traZODone (DESYREL) 50 MG tablet Take 0.5-1 tablets (25-50 mg total) by mouth at bedtime as needed for sleep.   triamterene-hydrochlorothiazide (DYAZIDE) 37.5-25 MG capsule TAKE 1 EACH (1 CAPSULE TOTAL) BY MOUTH DAILY.   No facility-administered encounter medications on file  as of 09/28/2021.    Allergies (verified) Celecoxib, Hydrocodone, Pollen extract, Sodium ferric gluconate [ferrous gluconate], Nasacort [triamcinolone], and Vicodin [hydrocodone-acetaminophen]   History: Past Medical History:  Diagnosis Date   Anemia    Anxiety    Bariatric surgery status    Constipation    COVID-19    Dysrhythmia    Elevated liver enzymes    GERD (gastroesophageal reflux disease)    Hemorrhoids    Herpes genitalis    High cholesterol    Hyperlipidemia    Hypertension    Hypothyroidism    IDA (iron deficiency anemia) 02/09/2021   Neuropathy    Osteoarthritis    Sleep apnea    Past Surgical History:  Procedure Laterality Date   ABDOMINAL  HYSTERECTOMY     total for fibroids no h/o abnormal pap   bariatric sleeve  2015   BREAST EXCISIONAL BIOPSY Left 1998   carpal tunnel repair     COLONOSCOPY WITH PROPOFOL N/A 02/11/2016   Procedure: COLONOSCOPY WITH PROPOFOL;  Surgeon: Jonathon Bellows, MD;  Location: [facility] ENDOSCOPY;  Service: Endoscopy;  Laterality: N/A;   COLONOSCOPY WITH PROPOFOL N/A 10/01/2019   Procedure: COLONOSCOPY WITH PROPOFOL;  Surgeon: Jonathon Bellows, MD;  Location: [facility] ENDOSCOPY;  Service: Gastroenterology;  Laterality: N/A;   COLONOSCOPY WITH PROPOFOL N/A 11/19/2020   Procedure: COLONOSCOPY WITH PROPOFOL;  Surgeon: Jonathon Bellows, MD;  Location: [facility] ENDOSCOPY;  Service: Gastroenterology;  Laterality: N/A;   ESOPHAGOGASTRODUODENOSCOPY (EGD) WITH PROPOFOL N/A 12/02/2019   Procedure: ESOPHAGOGASTRODUODENOSCOPY (EGD) WITH PROPOFOL;  Surgeon: Jonathon Bellows, MD;  Location: [facility] ENDOSCOPY;  Service: Gastroenterology;  Laterality: N/A;   ESOPHAGOGASTRODUODENOSCOPY (EGD) WITH PROPOFOL N/A 11/19/2020   Procedure: ESOPHAGOGASTRODUODENOSCOPY (EGD) WITH PROPOFOL;  Surgeon: Jonathon Bellows, MD;  Location: [facility] ENDOSCOPY;  Service: Gastroenterology;  Laterality: N/A;   FLEXIBLE SIGMOIDOSCOPY N/A 02/06/2021   Procedure: FLEXIBLE SIGMOIDOSCOPY;  Surgeon: [name], DO;  Location[facility] ENDOSCOPY;  Service: Gastroenterology;  Laterality: N/A;   GIVENS CAPSULE STUDY N/A 06/07/2021   Procedure: GIVENS CAPSULE STUDY;  Surgeon: Lin Landsman, MD;  Location: [facility] ENDOSCOPY;  Service: Gastroenterology;  Laterality: N/A;   GIVENS CAPSULE STUDY N/A 06/09/2021   Procedure: GIVENS CAPSULE STUDY;  Surgeon: Lin Landsman, MD;  Location: [facility] ENDOSCOPY;  Service: Gastroenterology;  Laterality: N/A;   HEMORRHOID SURGERY     LAPAROSCOPIC GASTRIC RESTRICTIVE DUODENAL PROCEDURE (DUODENAL SWITCH) Bilateral    2020   Family History  Problem Relation Age of Onset   Breast cancer Sister 71       materal 1/2 sister   Hypertension Sister     Hypertension Mother    Heart disease Father    Hypertension Brother    Social History   Socioeconomic History   Marital status: Married    Spouse name: Not on file   Number of children: Not on file   Years of education: Not on file   Highest education level: Not on file  Occupational History   Not on file  Tobacco Use   Smoking status: Former    Packs/day: 1.50    Years: 24.00    Total pack years: 36.00    Types: Cigarettes    Quit date: 63    Years since quitting: 30.7   Smokeless tobacco: Never   Tobacco comments:    quit 1995.   Vaping Use   Vaping Use: Never used  Substance and Sexual Activity   Alcohol use: Yes    Alcohol/week: 1.0 standard drink of alcohol    Types:  1 Glasses of wine per week    Comment: DAILY   Drug use: No   Sexual activity: Not Currently    Birth control/protection: Surgical    Comment: Hysterectomy  Other Topics Concern   Not on file  Social History Narrative   Lives in [place].    Married.    Retired 2015, Interior and spatial designer.    One son; granddaughter.    Left handed    Caffeine- decaf coffee.    Social Determinants of Health   Financial Resource Strain: Low Risk  (09/28/2021)   Overall Financial Resource Strain (CARDIA)    Difficulty of Paying Living Expenses: Not hard at all  Food Insecurity: No Food Insecurity (09/28/2021)   Hunger Vital Sign    Worried About Running Out of Food in the Last Year: Never true    Ran Out of Food in the Last Year: Never true  Transportation Needs: No Transportation Needs (09/28/2021)   PRAPARE - Hydrologist (Medical): No    Lack of Transportation (Non-Medical): No  Physical Activity: Insufficiently Active (09/06/2019)   Exercise Vital Sign    Days of Exercise per Week: 3 days    Minutes of Exercise per Session: 20 min  Stress: No Stress Concern Present (09/28/2021)   [place]    Feeling of  Stress : Not at all  Social Connections: Unknown (09/28/2021)   Social Connection and Isolation Panel [NHANES]    Frequency of Communication with Friends and Family: More than three times a week    Frequency of Social Gatherings with Friends and Family: More than three times a week    Attends Religious Services: Not on Advertising copywriter or Organizations: Not on file    Attends Archivist Meetings: Not on file    Marital Status: Married    Tobacco Counseling Counseling given: Not Answered Tobacco comments: quit 1995.    Clinical Intake:                         Activities of Daily Living    09/28/2021    1:49 PM 06/06/2021    9:30 PM  In your present state of health, do you have any difficulty performing the following activities:  Hearing? 0 0  Vision? 0 0  Difficulty concentrating or making decisions? 0 0  Walking or climbing stairs? 0 0  Comment Paces self with activity   Dressing or bathing? 0 0  Doing errands, shopping? 0 0  Preparing Food and eating ? N   Using the Toilet? N   In the past six months, have you accidently leaked urine? N   Do you have problems with loss of bowel control? N   Managing your Medications? N   Managing your Finances? N   Housekeeping or managing your Housekeeping? N     Patient Care Team: [name], FNP as PCP - General (Family Medicine) Kennith Center, RD as Dietitian (Family Medicine) Earlie Server, MD as Consulting Physician (Hematology and Oncology)  Indicate any recent Medical Services you may have received from other than [facility] providers in the past year (date may be approximate).     Assessment:   This is a routine wellness examination for Jennifer [name].  Virtual Visit via Telephone Note  I connected with  Jennifer [name] on 09/28/21 at  1:30 PM EDT by telephone and  verified that I am speaking with the correct person using two identifiers.  Location: Patient: home Provider: office Persons  participating in the virtual visit: patient/Nurse Health Advisor   I discussed the limitations of performing an evaluation and management service by telehealth. We continued and completed visit with audio only. Some vital signs may be absent or patient reported.   Hearing/Vision screen Hearing Screening - Comments:: Patient is followed by [facility]. She does not wear hearing aids. Some difficulty hearing some conversational tones Vision Screening - Comments:: Followed by [facility] Wears corrective lenses  Vision examined within the last 12 months  Cataract surgery scheduled 10/2021 and 11/2021.   Dietary issues and exercise activities discussed: Current Exercise Habits: Home exercise routine, Intensity: Mild   Goals Addressed               This Visit's Progress     Patient Stated     Increase physical activity (pt-stated)        I would like my weight to 155lb. Stay active. Healthy diet.       Depression Screen    09/28/2021    1:45 PM 09/15/2021   10:44 AM 04/07/2021    9:34 AM 11/25/2020    3:07 PM 09/08/2020    9:21 AM 05/18/2020    9:20 AM 02/25/2020    8:33 AM  PHQ 2/9 Scores  PHQ - 2 Score 0 0 0 0 0 0 0  PHQ- 9 Score   0        Fall Risk    09/28/2021    1:48 PM 09/15/2021   10:44 AM 04/07/2021    9:34 AM 11/25/2020    3:07 PM 09/08/2020    9:23 AM  Fall Risk   Falls in the past year? 1 0 1 1 0  Number falls in past yr: 0 0 1 1 0  Injury with Fall?  0 0 0 0  Comment Fell and hit head      Risk for fall due to :  No Fall Risks History of fall(s)    Follow up Falls prevention discussed;Falls evaluation completed Falls evaluation completed Falls evaluation completed Falls evaluation completed Falls evaluation completed    FALL RISK PREVENTION PERTAINING TO THE HOME: Home free of loose throw rugs in walkways, pet beds, electrical cords, etc? Yes  Adequate lighting in your home to reduce risk of falls? Yes   ASSISTIVE DEVICES UTILIZED TO PREVENT  FALLS: Life alert? No  Use of a cane, walker or w/c? No   TIMED UP AND GO: Was the test performed? No .   Cognitive Function:    08/31/2017   10:56 AM  MMSE - Mini Mental State Exam  Orientation to time 5  Orientation to Place 5  Registration 3  Attention/ Calculation 5  Recall 3  Language- name 2 objects 2  Language- repeat 1  Language- follow 3 step command 3  Language- read & follow direction 1  Write a sentence 1  Copy design 1  Total score 30        09/28/2021    1:51 PM 09/06/2019    9:32 AM 09/05/2018   10:41 AM  6CIT Screen  What Year? 0 points 0 points 0 points  What month? 0 points 0 points 0 points  What time? 0 points 0 points 0 points  Count back from 20 0 points 0 points 0 points  Months in reverse 0 points 0 points 0 points  Repeat phrase 0 points 0 points 0 points  Total Score 0 points 0 points 0 points    Immunizations Immunization History  Administered Date(s) Administered   Fluad Quad(high Dose 65+) 10/02/2020   Hep A / Hep B 05/24/2013, 11/21/2013   Influenza Split 10/21/2013   Influenza, High Dose Seasonal PF 10/24/2016, 09/06/2018   Influenza,inj,Quad PF,6+ Mos 11/08/2013, 10/28/2014   Influenza-Unspecified 12/05/2011, 10/28/2015, 10/24/2016, 09/06/2017, 10/18/2019   PFIZER(Purple Top)SARS-COV-2 Vaccination 03/15/2019, 04/09/2019, 04/09/2019, 11/01/2019, 08/07/2020   Pfizer Covid-19 Vaccine Bivalent Booster 5yr & up 07/15/2021   Pneumococcal Conjugate-13 12/05/2014, 04/06/2016   Pneumococcal Polysaccharide-23 06/05/2017   Tdap 02/05/2014, 08/07/2018   Zoster Recombinat (Shingrix) 12/25/2018   Zoster, Live 01/09/2012, 12/05/2014   Flu Vaccine status: Due, Education has been provided regarding the importance of this vaccine. Advised may receive this vaccine at local pharmacy or Health Dept. Aware to provide a copy of the vaccination record if obtained from local pharmacy or Health Dept. Verbalized acceptance and understanding.  Covid-19  vaccine status: Completed vaccines x6.  Notes first dose received. Agrees to update immunization record.  Shingrix Completed?: No.    Education has been provided regarding the importance of this vaccine. Patient has been advised to call insurance company to determine out of pocket expense if they have not yet received this vaccine. Advised may also receive vaccine at local pharmacy or Health Dept. Verbalized acceptance and understanding.  Screening Tests Health Maintenance  Topic Date Due   COVID-19 Vaccine (7 - Pfizer risk series) 10/14/2021 (Originally 09/09/2021)   Zoster Vaccines- Shingrix (2 of 2) 11/17/2021 (Originally 02/19/2019)   INFLUENZA VACCINE  04/17/2022 (Originally 08/17/2021)   FOOT EXAM  02/10/2022   HEMOGLOBIN A1C  02/19/2022   OPHTHALMOLOGY EXAM  09/18/2022   MAMMOGRAM  07/01/2023   COLONOSCOPY (Pts 45-458yrInsurance coverage will need to be confirmed)  11/19/2025   TETANUS/TDAP  08/06/2028   Pneumonia Vaccine 6551Years old  Completed   DEXA SCAN  Completed   Hepatitis C Screening  Completed   HPV VACCINES  Aged Out   Health Maintenance There are no preventive care reminders to display for this patient.  Lung Cancer Screening: (Low Dose CT Chest recommended if Age 72-80ears, 30 pack-year currently smoking OR have quit w/in 15years.) does not qualify.   Vision Screening: Recommended annual ophthalmology exams for early detection of glaucoma and other disorders of the eye.  Dental Screening: Recommended annual dental exams for proper oral hygiene  Community Resource Referral / Chronic Care Management: CRR required this visit?  No   CCM required this visit?  No      Plan:     I have personally reviewed and noted the following in the patient's chart:   Medical and social history Use of alcohol, tobacco or illicit drugs  Current medications and supplements including opioid prescriptions. Patient is not currently taking opioid prescriptions. Functional ability  and status Nutritional status Physical activity Advanced directives List of other physicians Hospitalizations, surgeries, and ER visits in previous 12 months Vitals Screenings to include cognitive, depression, and falls Referrals and appointments  In addition, I have reviewed and discussed with patient certain preventive protocols, quality metrics, and best practice recommendations. A written personalized care plan for preventive services as well as general preventive health recommendations were provided to patient.     OBVarney BilesLPN   09/21/89/6384

## 2021-09-28 NOTE — Patient Instructions (Addendum)
Jennifer [name] , Thank you for taking time to come for your Medicare Wellness Visit. I appreciate your ongoing commitment to your health goals. Please review the following plan we discussed and let me know if I can assist you in the future.   These are the goals we discussed:  Goals       Patient Stated     Increase physical activity (pt-stated)      I would like my weight to 155lb. Stay active. Healthy diet.        This is a list of the screening recommended for you and due dates:  Health Maintenance  Topic Date Due   COVID-19 Vaccine (7 - Pfizer risk series) 10/14/2021*   Zoster (Shingles) Vaccine (2 of 2) 11/17/2021*   Flu Shot  04/17/2022*   Complete foot exam   02/10/2022   Hemoglobin A1C  02/19/2022   Eye exam for diabetics  09/18/2022   Mammogram  07/01/2023   Colon Cancer Screening  11/19/2025   Tetanus Vaccine  08/06/2028   Pneumonia Vaccine  Completed   DEXA scan (bone density measurement)  Completed   Hepatitis C Screening: USPSTF Recommendation to screen - Ages 70-79 yo.  Completed   HPV Vaccine  Aged Out  *Topic was postponed. The date shown is not the original due date.     Next appointment: Follow up in one year for your annual wellness visit    Preventive Care 65 Years and Older, Female Preventive care refers to lifestyle choices and visits with your health care provider that can promote health and wellness. What does preventive care include? A yearly physical exam. This is also called an annual well check. Dental exams once or twice a year. Routine eye exams. Ask your health care provider how often you should have your eyes checked. Personal lifestyle choices, including: Daily care of your teeth and gums. Regular physical activity. Eating a healthy diet. Avoiding tobacco and drug use. Limiting alcohol use. Practicing safe sex. Taking low-dose aspirin every day. Taking vitamin and mineral supplements as recommended by your health care  provider. What happens during an annual well check? The services and screenings done by your health care provider during your annual well check will depend on your age, overall health, lifestyle risk factors, and family history of disease. Counseling  Your health care provider may ask you questions about your: Alcohol use. Tobacco use. Drug use. Emotional well-being. Home and relationship well-being. Sexual activity. Eating habits. History of falls. Memory and ability to understand (cognition). Work and work Statistician. Reproductive health. Screening  You may have the following tests or measurements: Height, weight, and BMI. Blood pressure. Lipid and cholesterol levels. These may be checked every 5 years, or more frequently if you are over 72 years old. Skin check. Lung cancer screening. You may have this screening every year starting at age 72 if you have a 30-pack-year history of smoking and currently smoke or have quit within the past 15 years. Fecal occult blood test (FOBT) of the stool. You may have this test every year starting at age 72. Flexible sigmoidoscopy or colonoscopy. You may have a sigmoidoscopy every 5 years or a colonoscopy every 10 years starting at age 72. Hepatitis C blood test. Hepatitis B blood test. Sexually transmitted disease (STD) testing. Diabetes screening. This is done by checking your blood sugar (glucose) after you have not eaten for a while (fasting). You may have this done every 1-3 years. Bone density scan. This is done to  screen for osteoporosis. You may have this done starting at age 72. Mammogram. This may be done every 1-2 years. Talk to your health care provider about how often you should have regular mammograms. Talk with your health care provider about your test results, treatment options, and if necessary, the need for more tests. Vaccines  Your health care provider may recommend certain vaccines, such as: Influenza vaccine. This is  recommended every year. Tetanus, diphtheria, and acellular pertussis (Tdap, Td) vaccine. You may need a Td booster every 10 years. Zoster vaccine. You may need this after age 72. Pneumococcal 13-valent conjugate (PCV13) vaccine. One dose is recommended after age 20. Pneumococcal polysaccharide (PPSV23) vaccine. One dose is recommended after age 13. Talk to your health care provider about which screenings and vaccines you need and how often you need them. This information is not intended to replace advice given to you by your health care provider. Make sure you discuss any questions you have with your health care provider. Document Released: 01/30/2015 Document Revised: 09/23/2015 Document Reviewed: 11/04/2014 Elsevier Interactive Patient Education  2017 [place] Prevention in the Home Falls can cause injuries. They can happen to people of all ages. There are many things you can do to make your home safe and to help prevent falls. What can I do on the outside of my home? Regularly fix the edges of walkways and driveways and fix any cracks. Remove anything that might make you trip as you walk through a door, such as a raised step or threshold. Trim any bushes or trees on the path to your home. Use bright outdoor lighting. Clear any walking paths of anything that might make someone trip, such as rocks or tools. Regularly check to see if handrails are loose or broken. Make sure that both sides of any steps have handrails. Any raised decks and porches should have guardrails on the edges. Have any leaves, snow, or ice cleared regularly. Use sand or salt on walking paths during winter. Clean up any spills in your garage right away. This includes oil or grease spills. What can I do in the bathroom? Use night lights. Install grab bars by the toilet and in the tub and shower. Do not use towel bars as grab bars. Use non-skid mats or decals in the tub or shower. If you need to sit down in  the shower, use a plastic, non-slip stool. Keep the floor dry. Clean up any water that spills on the floor as soon as it happens. Remove soap buildup in the tub or shower regularly. Attach bath mats securely with double-sided non-slip rug tape. Do not have throw rugs and other things on the floor that can make you trip. What can I do in the bedroom? Use night lights. Make sure that you have a light by your bed that is easy to reach. Do not use any sheets or blankets that are too big for your bed. They should not hang down onto the floor. Have a firm chair that has side arms. You can use this for support while you get dressed. Do not have throw rugs and other things on the floor that can make you trip. What can I do in the kitchen? Clean up any spills right away. Avoid walking on wet floors. Keep items that you use a lot in easy-to-reach places. If you need to reach something above you, use a strong step stool that has a grab bar. Keep electrical cords out of the way.  Do not use floor polish or wax that makes floors slippery. If you must use wax, use non-skid floor wax. Do not have throw rugs and other things on the floor that can make you trip. What can I do with my stairs? Do not leave any items on the stairs. Make sure that there are handrails on both sides of the stairs and use them. Fix handrails that are broken or loose. Make sure that handrails are as long as the stairways. Check any carpeting to make sure that it is firmly attached to the stairs. Fix any carpet that is loose or worn. Avoid having throw rugs at the top or bottom of the stairs. If you do have throw rugs, attach them to the floor with carpet tape. Make sure that you have a light switch at the top of the stairs and the bottom of the stairs. If you do not have them, ask someone to add them for you. What else can I do to help prevent falls? Wear shoes that: Do not have high heels. Have rubber bottoms. Are comfortable  and fit you well. Are closed at the toe. Do not wear sandals. If you use a stepladder: Make sure that it is fully opened. Do not climb a closed stepladder. Make sure that both sides of the stepladder are locked into place. Ask someone to hold it for you, if possible. Clearly mark and make sure that you can see: Any grab bars or handrails. First and last steps. Where the edge of each step is. Use tools that help you move around (mobility aids) if they are needed. These include: Canes. Walkers. Scooters. Crutches. Turn on the lights when you go into a dark area. Replace any light bulbs as soon as they burn out. Set up your furniture so you have a clear path. Avoid moving your furniture around. If any of your floors are uneven, fix them. If there are any pets around you, be aware of where they are. Review your medicines with your doctor. Some medicines can make you feel dizzy. This can increase your chance of falling. Ask your doctor what other things that you can do to help prevent falls. This information is not intended to replace advice given to you by your health care provider. Make sure you discuss any questions you have with your health care provider. Document Released: 10/30/2008 Document Revised: 06/11/2015 Document Reviewed: 02/07/2014 Elsevier Interactive Patient Education  2017 [employer].

## 2021-10-05 ENCOUNTER — Ambulatory Visit (INDEPENDENT_AMBULATORY_CARE_PROVIDER_SITE_OTHER): Payer: Medicare Other

## 2021-10-05 DIAGNOSIS — Z23 Encounter for immunization: Secondary | ICD-10-CM | POA: Diagnosis not present

## 2021-10-12 ENCOUNTER — Ambulatory Visit: Payer: Medicare Other | Admitting: Family

## 2021-10-14 ENCOUNTER — Other Ambulatory Visit: Payer: Self-pay | Admitting: Family

## 2021-10-15 DIAGNOSIS — M5416 Radiculopathy, lumbar region: Secondary | ICD-10-CM | POA: Diagnosis not present

## 2021-10-15 DIAGNOSIS — M48062 Spinal stenosis, lumbar region with neurogenic claudication: Secondary | ICD-10-CM | POA: Diagnosis not present

## 2021-10-20 DIAGNOSIS — H2511 Age-related nuclear cataract, right eye: Secondary | ICD-10-CM | POA: Diagnosis not present

## 2021-10-25 ENCOUNTER — Encounter: Payer: Self-pay | Admitting: Ophthalmology

## 2021-10-27 NOTE — Discharge Instructions (Signed)

## 2021-10-29 ENCOUNTER — Ambulatory Visit (LOCAL_COMMUNITY_HEALTH_CENTER): Payer: Medicare Other

## 2021-10-29 ENCOUNTER — Encounter: Payer: Self-pay | Admitting: Oncology

## 2021-10-29 DIAGNOSIS — Z23 Encounter for immunization: Secondary | ICD-10-CM | POA: Diagnosis not present

## 2021-10-29 DIAGNOSIS — Z719 Counseling, unspecified: Secondary | ICD-10-CM

## 2021-10-29 NOTE — Progress Notes (Signed)
  Are you feeling sick today? No   Have you ever received a dose of COVID-19 Vaccine? [employer], [place], [place], [place], Other) Yes  If yes, which vaccine and how many doses?  PFIZER, 5   Did you bring the vaccination record card or other documentation?  Yes   Do you have a health condition or are undergoing treatment that makes you moderately or severely immunocompromised? This would include, but not be limited to: cancer, HIV, organ transplant, immunosuppressive therapy/high-dose corticosteroids, or moderate/severe primary immunodeficiency.  No  Have you received COVID-19 vaccine before or during hematopoietic cell transplant (HCT) or CAR-T-cell therapies? No  Have you ever had an allergic reaction to: (This would include a severe allergic reaction or a reaction that caused hives, swelling, or respiratory distress, including wheezing.) A component of a COVID-19 vaccine or a previous dose of COVID-19 vaccine? No   Have you ever had an allergic reaction to another vaccine (other thanCOVID-19 vaccine) or an injectable medication? (This would include a severe allergic reaction or a reaction that caused hives, swelling, or respiratory distress, including wheezing.)   No    Do you have a history of any of the following:  Myocarditis or Pericarditis No  Dermal fillers:  No  Multisystem Inflammatory Syndrome (MIS-C or MIS-A)? No  COVID-19 disease within the past 3 months? No  Vaccinated with monkeypox vaccine in the last 4 weeks? No  Eligible and administered Comirnaty 12+2023-2024, monitored, tolerated well. Verbalized understanding of VIS and NCIR copy. M.Hercules Hasler, LPN.

## 2021-11-01 ENCOUNTER — Other Ambulatory Visit: Payer: Self-pay

## 2021-11-01 ENCOUNTER — Encounter: Payer: Self-pay | Admitting: Ophthalmology

## 2021-11-01 ENCOUNTER — Ambulatory Visit: Payer: Medicare Other | Admitting: Anesthesiology

## 2021-11-01 ENCOUNTER — Ambulatory Visit
Admission: RE | Admit: 2021-11-01 | Discharge: 2021-11-01 | Disposition: A | Discharge status: Home / Self Care | Payer: Medicare Other | Attending: Ophthalmology | Admitting: Ophthalmology

## 2021-11-01 ENCOUNTER — Encounter
Admission: RE | Disposition: A | Discharge status: Home / Self Care | Payer: Self-pay | Source: Home / Self Care | Attending: Ophthalmology

## 2021-11-01 DIAGNOSIS — R001 Bradycardia, unspecified: Secondary | ICD-10-CM | POA: Insufficient documentation

## 2021-11-01 DIAGNOSIS — D759 Disease of blood and blood-forming organs, unspecified: Secondary | ICD-10-CM | POA: Diagnosis not present

## 2021-11-01 DIAGNOSIS — F418 Other specified anxiety disorders: Secondary | ICD-10-CM | POA: Diagnosis not present

## 2021-11-01 DIAGNOSIS — I10 Essential (primary) hypertension: Secondary | ICD-10-CM | POA: Diagnosis not present

## 2021-11-01 DIAGNOSIS — E039 Hypothyroidism, unspecified: Secondary | ICD-10-CM | POA: Insufficient documentation

## 2021-11-01 DIAGNOSIS — G4730 Sleep apnea, unspecified: Secondary | ICD-10-CM | POA: Diagnosis not present

## 2021-11-01 DIAGNOSIS — D649 Anemia, unspecified: Secondary | ICD-10-CM | POA: Insufficient documentation

## 2021-11-01 DIAGNOSIS — H2511 Age-related nuclear cataract, right eye: Secondary | ICD-10-CM | POA: Insufficient documentation

## 2021-11-01 DIAGNOSIS — E669 Obesity, unspecified: Secondary | ICD-10-CM | POA: Diagnosis not present

## 2021-11-01 DIAGNOSIS — K219 Gastro-esophageal reflux disease without esophagitis: Secondary | ICD-10-CM | POA: Diagnosis not present

## 2021-11-01 DIAGNOSIS — Z9884 Bariatric surgery status: Secondary | ICD-10-CM | POA: Diagnosis not present

## 2021-11-01 DIAGNOSIS — G629 Polyneuropathy, unspecified: Secondary | ICD-10-CM | POA: Diagnosis not present

## 2021-11-01 DIAGNOSIS — G8929 Other chronic pain: Secondary | ICD-10-CM | POA: Insufficient documentation

## 2021-11-01 DIAGNOSIS — T7840XA Allergy, unspecified, initial encounter: Secondary | ICD-10-CM | POA: Diagnosis not present

## 2021-11-01 DIAGNOSIS — Z6834 Body mass index (BMI) 34.0-34.9, adult: Secondary | ICD-10-CM | POA: Insufficient documentation

## 2021-11-01 DIAGNOSIS — E1136 Type 2 diabetes mellitus with diabetic cataract: Secondary | ICD-10-CM | POA: Diagnosis not present

## 2021-11-01 HISTORY — PX: CATARACT EXTRACTION W/PHACO: SHX586

## 2021-11-01 HISTORY — DX: Other complications of anesthesia, initial encounter: T88.59XA

## 2021-11-01 SURGERY — PHACOEMULSIFICATION, CATARACT, WITH IOL INSERTION
Anesthesia: Monitor Anesthesia Care | Site: Eye | Laterality: Right

## 2021-11-01 MED ORDER — SIGHTPATH DOSE#1 BSS IO SOLN
INTRAOCULAR | Status: DC | PRN
  Administered 2021-11-01: 114 mL via OPHTHALMIC

## 2021-11-01 MED ORDER — TETRACAINE HCL 0.5 % OP SOLN
1.0000 [drp] | OPHTHALMIC | Status: DC | PRN
  Administered 2021-11-01 (×3): 1 [drp] via OPHTHALMIC

## 2021-11-01 MED ORDER — MIDAZOLAM HCL 2 MG/2ML IJ SOLN
INTRAMUSCULAR | Status: DC | PRN
  Administered 2021-11-01 (×2): 1 mg via INTRAVENOUS

## 2021-11-01 MED ORDER — FENTANYL CITRATE (PF) 100 MCG/2ML IJ SOLN
INTRAMUSCULAR | Status: DC | PRN
  Administered 2021-11-01 (×2): 50 ug via INTRAVENOUS

## 2021-11-01 MED ORDER — SIGHTPATH DOSE#1 BSS IO SOLN
INTRAOCULAR | Status: DC | PRN
  Administered 2021-11-01: 15 mL

## 2021-11-01 MED ORDER — ARMC OPHTHALMIC DILATING DROPS
1.0000 | OPHTHALMIC | Status: DC | PRN
  Administered 2021-11-01 (×3): 1 via OPHTHALMIC

## 2021-11-01 MED ORDER — SIGHTPATH DOSE#1 NA HYALUR & NA CHOND-NA HYALUR IO KIT
PACK | INTRAOCULAR | Status: DC | PRN
  Administered 2021-11-01: 1 via OPHTHALMIC

## 2021-11-01 MED ORDER — LIDOCAINE HCL (PF) 2 % IJ SOLN
INTRAOCULAR | Status: DC | PRN
  Administered 2021-11-01: 1 mL via INTRAOCULAR

## 2021-11-01 MED ORDER — MOXIFLOXACIN HCL 0.5 % OP SOLN
OPHTHALMIC | Status: DC | PRN
  Administered 2021-11-01: 0.2 mL via OPHTHALMIC

## 2021-11-01 SURGICAL SUPPLY — 13 items
CATARACT SUITE SIGHTPATH (MISCELLANEOUS) ×1 IMPLANT
DISSECTOR HYDRO NUCLEUS 50X22 (MISCELLANEOUS) ×1 IMPLANT
FEE CATARACT SUITE SIGHTPATH (MISCELLANEOUS) ×1 IMPLANT
GLOVE SURG GAMMEX PI TX LF 7.5 (GLOVE) ×1 IMPLANT
GLOVE SURG SYN 8.5  E (GLOVE) ×1
GLOVE SURG SYN 8.5 E (GLOVE) ×1 IMPLANT
GLOVE SURG SYN 8.5 PF PI (GLOVE) ×1 IMPLANT
LENS IOL TECNIS EYHANCE 20.5 (Intraocular Lens) IMPLANT
NDL FILTER BLUNT 18X1 1/2 (NEEDLE) ×1 IMPLANT
NEEDLE FILTER BLUNT 18X1 1/2 (NEEDLE) ×1 IMPLANT
SYR 3ML LL SCALE MARK (SYRINGE) ×1 IMPLANT
SYR 5ML LL (SYRINGE) ×1 IMPLANT
WATER STERILE IRR 250ML POUR (IV SOLUTION) ×1 IMPLANT

## 2021-11-01 NOTE — Op Note (Signed)
OPERATIVE NOTE  Jennifer [name] [id number] 11/01/2021   PREOPERATIVE DIAGNOSIS:  Nuclear sclerotic cataract right eye.  H25.11   POSTOPERATIVE DIAGNOSIS:    Nuclear sclerotic cataract right eye.     PROCEDURE:  Phacoemusification with posterior chamber intraocular lens placement of the right eye   LENS:   Implant Name Type Inv. Item Serial No. Manufacturer Lot No. LRB No. Used Action  LENS IOL TECNIS EYHANCE 20.5 - [id number] Intraocular Lens LENS IOL TECNIS EYHANCE 20.5 [id number] [employer]  Right 1 Implanted       Procedure(s) with comments: CATARACT EXTRACTION PHACO AND INTRAOCULAR LENS PLACEMENT (IOC) RIGHT (Right) - sleep apnea 4.95 00:49.7  DIB00 +20.5   ULTRASOUND TIME: 0 minutes 49 seconds.  CDE 4.95   SURGEON:  Benay Pillow, MD, MPH  ANESTHESIOLOGIST: Anesthesiologist: Darrin Nipper, MD CRNA: Moises Blood, CRNA   ANESTHESIA:  Topical with tetracaine drops augmented with 1% preservative-free intracameral lidocaine.  ESTIMATED BLOOD LOSS: less than 1 mL.   COMPLICATIONS:  None.   DESCRIPTION OF PROCEDURE:  The patient was identified in the holding room and transported to the operating room and placed in the supine position under the operating microscope.  The right eye was identified as the operative eye and it was prepped and draped in the usual sterile ophthalmic fashion.   A 1.0 millimeter clear-corneal paracentesis was made at the 10:30 position. 0.5 ml of preservative-free 1% lidocaine with epinephrine was injected into the anterior chamber.  The anterior chamber was filled with viscoelastic.  A 2.4 millimeter keratome was used to make a near-clear corneal incision at the 8:00 position.  A curvilinear capsulorrhexis was made with a cystotome and capsulorrhexis forceps.  Balanced salt solution was used to hydrodissect and hydrodelineate the nucleus.   Phacoemulsification was then used in stop and chop fashion to remove the lens nucleus and epinucleus.  The  remaining cortex was then removed using the irrigation and aspiration handpiece. Viscoelastic was then placed into the capsular bag to distend it for lens placement.  A lens was then injected into the capsular bag.  The remaining viscoelastic was aspirated.   Wounds were hydrated with balanced salt solution.  The anterior chamber was inflated to a physiologic pressure with balanced salt solution.   Intracameral vigamox 0.1 mL undiluted was injected into the eye and a drop placed onto the ocular surface.  No wound leaks were noted.  The patient was taken to the recovery room in stable condition without complications of anesthesia or surgery  Benay Pillow 11/01/2021, 10:03 AM

## 2021-11-01 NOTE — Anesthesia Postprocedure Evaluation (Signed)
Anesthesia Post Note  Patient: Research scientist (life sciences)  Procedure(s) Performed: CATARACT EXTRACTION PHACO AND INTRAOCULAR LENS PLACEMENT (IOC) RIGHT (Right: Eye)  Patient location during evaluation: PACU Anesthesia Type: MAC Level of consciousness: awake and alert, oriented and patient cooperative Pain management: pain level controlled Vital Signs Assessment: post-procedure vital signs reviewed and stable Respiratory status: spontaneous breathing, nonlabored ventilation and respiratory function stable Cardiovascular status: blood pressure returned to baseline and stable Postop Assessment: adequate PO intake Anesthetic complications: no   No notable events documented.   Last Vitals:  Vitals:   11/01/21 0858 11/01/21 1004  BP: 137/69 122/74  Resp: 14 18  Temp: (!) 36.3 C (!) 36.3 C  SpO2: 98% 98%    Last Pain:  Vitals:   11/01/21 1012  TempSrc:   PainSc: 0-No pain                 Darrin Nipper

## 2021-11-01 NOTE — H&P (Signed)
[facility]   Primary Care Physician:  [name], FNP Ophthalmologist: Dr. Benay Pillow  Pre-Procedure History & Physical: HPI:  Jennifer [name] is a 71 y.o. female here for cataract surgery.   Past Medical History:  Diagnosis Date   Anemia    Anxiety    Bariatric surgery status    Complication of anesthesia    Diificulty breathing for about 15 minutes after bariatric surgery   Constipation    COVID-19    Dysrhythmia    Elevated liver enzymes    GERD (gastroesophageal reflux disease)    Hemorrhoids    Herpes genitalis    High cholesterol    Hyperlipidemia    Hypertension    Hypothyroidism    IDA (iron deficiency anemia) 02/09/2021   Neuropathy    Osteoarthritis    Sleep apnea    CPAP    Past Surgical History:  Procedure Laterality Date   ABDOMINAL HYSTERECTOMY     total for fibroids no h/o abnormal pap   bariatric sleeve  2015   BREAST EXCISIONAL BIOPSY Left 1998   carpal tunnel repair     COLONOSCOPY WITH PROPOFOL N/A 02/11/2016   Procedure: COLONOSCOPY WITH PROPOFOL;  Surgeon: Jonathon Bellows, MD;  Location: [facility] ENDOSCOPY;  Service: Endoscopy;  Laterality: N/A;   COLONOSCOPY WITH PROPOFOL N/A 10/01/2019   Procedure: COLONOSCOPY WITH PROPOFOL;  Surgeon: Jonathon Bellows, MD;  Location: [facility] ENDOSCOPY;  Service: Gastroenterology;  Laterality: N/A;   COLONOSCOPY WITH PROPOFOL N/A 11/19/2020   Procedure: COLONOSCOPY WITH PROPOFOL;  Surgeon: Jonathon Bellows, MD;  Location: [facility] ENDOSCOPY;  Service: Gastroenterology;  Laterality: N/A;   ESOPHAGOGASTRODUODENOSCOPY (EGD) WITH PROPOFOL N/A 12/02/2019   Procedure: ESOPHAGOGASTRODUODENOSCOPY (EGD) WITH PROPOFOL;  Surgeon: Jonathon Bellows, MD;  Location: [facility] ENDOSCOPY;  Service: Gastroenterology;  Laterality: N/A;   ESOPHAGOGASTRODUODENOSCOPY (EGD) WITH PROPOFOL N/A 11/19/2020   Procedure: ESOPHAGOGASTRODUODENOSCOPY (EGD) WITH PROPOFOL;  Surgeon: Jonathon Bellows, MD;  Location: [facility] ENDOSCOPY;  Service: Gastroenterology;   Laterality: N/A;   FLEXIBLE SIGMOIDOSCOPY N/A 02/06/2021   Procedure: FLEXIBLE SIGMOIDOSCOPY;  Surgeon: [name], DO;  Location: [facility] ENDOSCOPY;  Service: Gastroenterology;  Laterality: N/A;   GIVENS CAPSULE STUDY N/A 06/07/2021   Procedure: GIVENS CAPSULE STUDY;  Surgeon: Lin Landsman, MD;  Location: [facility] ENDOSCOPY;  Service: Gastroenterology;  Laterality: N/A;   GIVENS CAPSULE STUDY N/A 06/09/2021   Procedure: GIVENS CAPSULE STUDY;  Surgeon: Lin Landsman, MD;  Location: [facility] ENDOSCOPY;  Service: Gastroenterology;  Laterality: N/A;   HEMORRHOID SURGERY     LAPAROSCOPIC GASTRIC RESTRICTIVE DUODENAL PROCEDURE (DUODENAL SWITCH) Bilateral    2020    Prior to Admission medications   Medication Sig Start Date End Date Taking? Authorizing Provider  acetaminophen (TYLENOL) 325 MG tablet Take 2 tablets (650 mg total) by mouth every 6 (six) hours as needed for mild pain (or Fever >/= 101). 06/12/20  Yes Nolberto Hanlon, MD  acyclovir (ZOVIRAX) 400 MG tablet TAKE 1 TABLET BY MOUTH EVERY DAY 08/21/21  Yes Dutch Quint B, FNP  calcium citrate (CALCITRATE - DOSED IN MG ELEMENTAL CALCIUM) 950 (200 Ca) MG tablet Take 200 mg of elemental calcium by mouth daily.   Yes [provider]  fexofenadine (ALLEGRA ALLERGY) 180 MG tablet Take 1 tablet (180 mg total) by mouth daily. 05/14/21  Yes Arnett, Yvetta Coder, FNP  fluticasone (FLONASE) 50 MCG/ACT nasal spray Place 2 sprays into both nostrils daily. 02/02/21  Yes Dutch Quint B, FNP  gabapentin (NEURONTIN) 300 MG capsule Take '600mg'$  qam, '600mg'$  every afternoon  and '300mg'$  qhs. 08/31/21  Yes [name], FNP  levothyroxine (SYNTHROID) 50 MCG tablet TAKE 1 TABLET BY MOUTH EVERY DAY 08/13/21  Yes [name], FNP  losartan (COZAAR) 100 MG tablet TAKE 1 TABLET BY MOUTH EVERYDAY AT BEDTIME 10/15/21  Yes Arnett, Yvetta Coder, FNP  Multiple Vitamin (MULTIVITAMIN WITH MINERALS) TABS tablet Take 1 tablet by mouth daily.   Yes [provider]  pantoprazole (PROTONIX) 40 MG tablet Take 1 tablet (40 mg total) by mouth 2 (two) times daily. 09/22/21  Yes Jonathon Bellows, MD  sodium chloride (OCEAN) 0.65 % SOLN nasal spray Place 2 sprays into both nostrils as needed for congestion. 01/21/21  Yes McLean-Scocuzza, Nino Glow, MD  traZODone (DESYREL) 50 MG tablet Take 0.5-1 tablets (25-50 mg total) by mouth at bedtime as needed for sleep. 08/31/21  Yes [name], FNP  triamterene-hydrochlorothiazide (DYAZIDE) 37.5-25 MG capsule TAKE 1 EACH (1 CAPSULE TOTAL) BY MOUTH DAILY. 09/13/21  Yes Arnett, Yvetta Coder, FNP  clotrimazole (LOTRIMIN) 1 % cream Apply 1 Application topically 2 (two) times daily. To right foot Patient not taking: Reported on 10/25/2021 08/31/21   [name], FNP  DULoxetine (CYMBALTA) 30 MG capsule Take 1 capsule (30 mg total) by mouth daily. Patient not taking: Reported on 10/25/2021 09/15/21   [name], FNP  hydrocortisone (ANUCORT-HC) 25 MG suppository UNWRAP AND INSERT 1 SUPPOSITORY RECTALLY TWICE A DAY 09/15/21   [name], FNP  ketoconazole (NIZORAL) 2 % cream Apply 1 application topically daily. Apply on bottom of foot and thin layer between toes 02/10/21   McDonald, Adam R, DPM  lidocaine (XYLOCAINE) 5 % ointment Apply 1 Application topically as needed. Patient not taking: Reported on 10/25/2021 09/14/21   Felipa Furnace, DPM    Allergies as of 09/09/2021 - Review Complete 09/07/2021  Allergen Reaction Noted   Celecoxib Hives 07/25/2016   Hydrocodone Nausea Only 06/02/2020   Pollen extract     Sodium ferric gluconate [ferrous gluconate]  11/20/2020   Nasacort [triamcinolone] Other (See Comments) 12/07/2015   Vicodin [hydrocodone-acetaminophen] Nausea Only 09/03/2014    Family History  Problem Relation Age of Onset   Breast cancer Sister 57       materal 1/2 sister   Hypertension Sister    Hypertension Mother    Heart disease Father    Hypertension Brother     Social History    Socioeconomic History   Marital status: Married    Spouse name: Not on file   Number of children: Not on file   Years of education: Not on file   Highest education level: Not on file  Occupational History   Not on file  Tobacco Use   Smoking status: Former    Packs/day: 1.50    Years: 24.00    Total pack years: 36.00    Types: Cigarettes    Quit date: 1993    Years since quitting: 30.8   Smokeless tobacco: Never   Tobacco comments:    quit 1995.   Vaping Use   Vaping Use: Never used  Substance and Sexual Activity   Alcohol use: Yes    Alcohol/week: 1.0 standard drink of alcohol    Types: 1 Glasses of wine per week    Comment: DAILY   Drug use: No   Sexual activity: Not Currently    Birth control/protection: Surgical    Comment: Hysterectomy  Other Topics Concern   Not on file  Social History Narrative  Lives in [place].    Married.    Retired 2015, Interior and spatial designer.    One son; granddaughter.    Left handed    Caffeine- decaf coffee.    Social Determinants of Health   Financial Resource Strain: Low Risk  (09/28/2021)   Overall Financial Resource Strain (CARDIA)    Difficulty of Paying Living Expenses: Not hard at all  Food Insecurity: No Food Insecurity (09/28/2021)   Hunger Vital Sign    Worried About Running Out of Food in the Last Year: Never true    Ran Out of Food in the Last Year: Never true  Transportation Needs: No Transportation Needs (09/28/2021)   PRAPARE - Hydrologist (Medical): No    Lack of Transportation (Non-Medical): No  Physical Activity: Insufficiently Active (09/06/2019)   Exercise Vital Sign    Days of Exercise per Week: 3 days    Minutes of Exercise per Session: 20 min  Stress: No Stress Concern Present (09/28/2021)   [place]    Feeling of Stress : Not at all  Social Connections: Unknown (09/28/2021)   Social Connection and  Isolation Panel [NHANES]    Frequency of Communication with Friends and Family: More than three times a week    Frequency of Social Gatherings with Friends and Family: More than three times a week    Attends Religious Services: Not on file    Active Member of [place] or Organizations: Not on file    Attends Archivist Meetings: Not on file    Marital Status: Married  Intimate Partner Violence: Not At Risk (09/28/2021)   Humiliation, Afraid, Rape, and Kick questionnaire    Fear of Current or Ex-Partner: No    Emotionally Abused: No    Physically Abused: No    Sexually Abused: No    Review of Systems: See HPI, otherwise negative ROS  Physical Exam: BP 137/69   Temp (!) 97.3 F (36.3 C) (Tympanic)   Resp 14   Ht 5' 0.98" (1.549 m)   Wt 82.1 kg   SpO2 98%   BMI 34.22 kg/m  General:   Alert, cooperative in NAD Head:  Normocephalic and atraumatic. Respiratory:  Normal work of breathing. Cardiovascular:  RRR  Impression/Plan: Jennifer [name] is here for cataract surgery.  Risks, benefits, limitations, and alternatives regarding cataract surgery have been reviewed with the patient.  Questions have been answered.  All parties agreeable.   Benay Pillow, MD  11/01/2021, 9:33 AM

## 2021-11-01 NOTE — Transfer of Care (Signed)
Immediate Anesthesia Transfer of Care Note  Patient: Jennifer [name]  Procedure(s) Performed: CATARACT EXTRACTION PHACO AND INTRAOCULAR LENS PLACEMENT (IOC) RIGHT (Right: Eye)  Patient Location: PACU  Anesthesia Type: MAC  Level of Consciousness: awake, alert  and patient cooperative  Airway and Oxygen Therapy: Patient Spontanous Breathing and Patient connected to supplemental oxygen  Post-op Assessment: Post-op Vital signs reviewed, Patient's Cardiovascular Status Stable, Respiratory Function Stable, Patent Airway and No signs of Nausea or vomiting  Post-op Vital Signs: Reviewed and stable  Complications: No notable events documented.

## 2021-11-01 NOTE — Anesthesia Preprocedure Evaluation (Addendum)
Anesthesia Evaluation  Patient identified by MRN, date of birth, ID band Patient awake    Reviewed: Allergy & Precautions, NPO status , Patient's Chart, lab work & pertinent test results  History of Anesthesia Complications Negative for: history of anesthetic complications  Airway Mallampati: IV   Neck ROM: Full    Dental   Infected molar:   Pulmonary asthma , sleep apnea , former smoker (quit 1993),    Pulmonary exam normal breath sounds clear to auscultation       Cardiovascular hypertension, Normal cardiovascular exam+ dysrhythmias (bradycardia)  Rhythm:Regular Rate:Normal     Neuro/Psych  Headaches, PSYCHIATRIC DISORDERS Anxiety Depression Chronic pain  Neuromuscular disease (neuropathy)    GI/Hepatic GERD  ,S/p gastric sleeve   Endo/Other  Hypothyroidism Obesity   Renal/GU negative Renal ROS     Musculoskeletal  (+) Arthritis ,   Abdominal   Peds  Hematology  (+) Blood dyscrasia, anemia ,   Anesthesia Other Findings   Reproductive/Obstetrics                            Anesthesia Physical Anesthesia Plan  ASA: 2  Anesthesia Plan: MAC   Post-op Pain Management:    Induction: Intravenous  PONV Risk Score and Plan: 2 and Treatment may vary due to age or medical condition, Midazolam and TIVA  Airway Management Planned: Natural Airway and Nasal Cannula  Additional Equipment:   Intra-op Plan:   Post-operative Plan:   Informed Consent: I have reviewed the patients History and Physical, chart, labs and discussed the procedure including the risks, benefits and alternatives for the proposed anesthesia with the patient or authorized representative who has indicated his/her understanding and acceptance.     Dental advisory given  Plan Discussed with: CRNA  Anesthesia Plan Comments: (LMA/GETA backup discussed.  Patient consented for risks of anesthesia including but not limited  to:  - adverse reactions to medications - damage to eyes, teeth, lips or other oral mucosa - nerve damage due to positioning  - sore throat or hoarseness - damage to heart, brain, nerves, lungs, other parts of body or loss of life  Informed patient about role of CRNA in peri- and intra-operative care.  Patient voiced understanding.)        Anesthesia Quick Evaluation

## 2021-11-02 ENCOUNTER — Encounter: Payer: Self-pay | Admitting: Ophthalmology

## 2021-11-02 ENCOUNTER — Other Ambulatory Visit: Payer: Self-pay

## 2021-11-04 ENCOUNTER — Other Ambulatory Visit: Payer: Self-pay | Admitting: Podiatry

## 2021-11-09 ENCOUNTER — Other Ambulatory Visit: Payer: Self-pay | Admitting: Family

## 2021-11-09 DIAGNOSIS — F419 Anxiety disorder, unspecified: Secondary | ICD-10-CM

## 2021-11-10 ENCOUNTER — Other Ambulatory Visit: Payer: Self-pay

## 2021-11-10 DIAGNOSIS — H2512 Age-related nuclear cataract, left eye: Secondary | ICD-10-CM | POA: Diagnosis not present

## 2021-11-12 ENCOUNTER — Other Ambulatory Visit: Payer: Medicare Other

## 2021-11-12 NOTE — Discharge Instructions (Signed)

## 2021-11-15 ENCOUNTER — Ambulatory Visit: Payer: Medicare Other | Admitting: Anesthesiology

## 2021-11-15 ENCOUNTER — Ambulatory Visit
Admission: RE | Admit: 2021-11-15 | Discharge: 2021-11-15 | Disposition: A | Discharge status: Home / Self Care | Payer: Medicare Other | Attending: Ophthalmology | Admitting: Ophthalmology

## 2021-11-15 ENCOUNTER — Encounter (INDEPENDENT_AMBULATORY_CARE_PROVIDER_SITE_OTHER): Payer: Self-pay

## 2021-11-15 ENCOUNTER — Other Ambulatory Visit: Payer: Self-pay

## 2021-11-15 ENCOUNTER — Encounter: Payer: Self-pay | Admitting: Ophthalmology

## 2021-11-15 ENCOUNTER — Encounter
Admission: RE | Disposition: A | Discharge status: Home / Self Care | Payer: Self-pay | Source: Home / Self Care | Attending: Ophthalmology

## 2021-11-15 ENCOUNTER — Ambulatory Visit: Payer: Medicare Other

## 2021-11-15 ENCOUNTER — Ambulatory Visit: Payer: Medicare Other | Admitting: Oncology

## 2021-11-15 DIAGNOSIS — Z87891 Personal history of nicotine dependence: Secondary | ICD-10-CM | POA: Insufficient documentation

## 2021-11-15 DIAGNOSIS — K219 Gastro-esophageal reflux disease without esophagitis: Secondary | ICD-10-CM | POA: Insufficient documentation

## 2021-11-15 DIAGNOSIS — F419 Anxiety disorder, unspecified: Secondary | ICD-10-CM | POA: Insufficient documentation

## 2021-11-15 DIAGNOSIS — I10 Essential (primary) hypertension: Secondary | ICD-10-CM | POA: Diagnosis not present

## 2021-11-15 DIAGNOSIS — H2512 Age-related nuclear cataract, left eye: Secondary | ICD-10-CM | POA: Insufficient documentation

## 2021-11-15 DIAGNOSIS — E669 Obesity, unspecified: Secondary | ICD-10-CM | POA: Diagnosis not present

## 2021-11-15 DIAGNOSIS — H269 Unspecified cataract: Secondary | ICD-10-CM | POA: Diagnosis not present

## 2021-11-15 DIAGNOSIS — F418 Other specified anxiety disorders: Secondary | ICD-10-CM | POA: Diagnosis not present

## 2021-11-15 DIAGNOSIS — Z9884 Bariatric surgery status: Secondary | ICD-10-CM | POA: Diagnosis not present

## 2021-11-15 DIAGNOSIS — F32A Depression, unspecified: Secondary | ICD-10-CM | POA: Diagnosis not present

## 2021-11-15 DIAGNOSIS — G4730 Sleep apnea, unspecified: Secondary | ICD-10-CM | POA: Diagnosis not present

## 2021-11-15 DIAGNOSIS — Z6834 Body mass index (BMI) 34.0-34.9, adult: Secondary | ICD-10-CM | POA: Diagnosis not present

## 2021-11-15 DIAGNOSIS — E039 Hypothyroidism, unspecified: Secondary | ICD-10-CM | POA: Diagnosis not present

## 2021-11-15 DIAGNOSIS — J45909 Unspecified asthma, uncomplicated: Secondary | ICD-10-CM | POA: Diagnosis not present

## 2021-11-15 DIAGNOSIS — G8929 Other chronic pain: Secondary | ICD-10-CM | POA: Insufficient documentation

## 2021-11-15 DIAGNOSIS — G629 Polyneuropathy, unspecified: Secondary | ICD-10-CM | POA: Diagnosis not present

## 2021-11-15 HISTORY — PX: CATARACT EXTRACTION W/PHACO: SHX586

## 2021-11-15 SURGERY — PHACOEMULSIFICATION, CATARACT, WITH IOL INSERTION
Anesthesia: Monitor Anesthesia Care | Site: Eye | Laterality: Left

## 2021-11-15 MED ORDER — LIDOCAINE HCL (PF) 2 % IJ SOLN
INTRAOCULAR | Status: DC | PRN
  Administered 2021-11-15: 4 mL via INTRAOCULAR

## 2021-11-15 MED ORDER — GLYCOPYRROLATE 0.2 MG/ML IJ SOLN
INTRAMUSCULAR | Status: DC | PRN
  Administered 2021-11-15: .2 mg via INTRAVENOUS

## 2021-11-15 MED ORDER — SIGHTPATH DOSE#1 BSS IO SOLN
INTRAOCULAR | Status: DC | PRN
  Administered 2021-11-15: 15 mL via INTRAOCULAR

## 2021-11-15 MED ORDER — TETRACAINE HCL 0.5 % OP SOLN
1.0000 [drp] | OPHTHALMIC | Status: DC | PRN
  Administered 2021-11-15 (×3): 1 [drp] via OPHTHALMIC

## 2021-11-15 MED ORDER — MIDAZOLAM HCL 2 MG/2ML IJ SOLN
INTRAMUSCULAR | Status: DC | PRN
  Administered 2021-11-15: 2 mg via INTRAVENOUS

## 2021-11-15 MED ORDER — SIGHTPATH DOSE#1 BSS IO SOLN
INTRAOCULAR | Status: DC | PRN
  Administered 2021-11-15: 87 mL via OPHTHALMIC

## 2021-11-15 MED ORDER — MOXIFLOXACIN HCL 0.5 % OP SOLN
OPHTHALMIC | Status: DC | PRN
  Administered 2021-11-15: 0.2 mL via OPHTHALMIC

## 2021-11-15 MED ORDER — SIGHTPATH DOSE#1 NA HYALUR & NA CHOND-NA HYALUR IO KIT
PACK | INTRAOCULAR | Status: DC | PRN
  Administered 2021-11-15: 1 via OPHTHALMIC

## 2021-11-15 MED ORDER — LACTATED RINGERS IV SOLN: INTRAVENOUS | Status: DC

## 2021-11-15 MED ORDER — ARMC OPHTHALMIC DILATING DROPS
1.0000 | OPHTHALMIC | Status: DC | PRN
  Administered 2021-11-15 (×3): 1 via OPHTHALMIC

## 2021-11-15 MED ORDER — FENTANYL CITRATE (PF) 100 MCG/2ML IJ SOLN
INTRAMUSCULAR | Status: DC | PRN
  Administered 2021-11-15 (×2): 50 ug via INTRAVENOUS

## 2021-11-15 SURGICAL SUPPLY — 13 items
CATARACT SUITE SIGHTPATH (MISCELLANEOUS) ×1 IMPLANT
DISSECTOR HYDRO NUCLEUS 50X22 (MISCELLANEOUS) ×1 IMPLANT
FEE CATARACT SUITE SIGHTPATH (MISCELLANEOUS) ×1 IMPLANT
GLOVE SURG GAMMEX PI TX LF 7.5 (GLOVE) ×1 IMPLANT
GLOVE SURG SYN 8.5  E (GLOVE) ×1
GLOVE SURG SYN 8.5 E (GLOVE) ×1 IMPLANT
GLOVE SURG SYN 8.5 PF PI (GLOVE) ×1 IMPLANT
LENS IOL TECNIS EYHANCE 20.5 (Intraocular Lens) IMPLANT
NDL FILTER BLUNT 18X1 1/2 (NEEDLE) ×1 IMPLANT
NEEDLE FILTER BLUNT 18X1 1/2 (NEEDLE) ×1 IMPLANT
SYR 3ML LL SCALE MARK (SYRINGE) ×1 IMPLANT
SYR 5ML LL (SYRINGE) ×1 IMPLANT
WATER STERILE IRR 250ML POUR (IV SOLUTION) ×1 IMPLANT

## 2021-11-15 NOTE — Anesthesia Postprocedure Evaluation (Signed)
Anesthesia Post Note  Patient: Research scientist (life sciences)  Procedure(s) Performed: CATARACT EXTRACTION PHACO AND INTRAOCULAR LENS PLACEMENT (IOC) LEFT 4.94 00:32.3 (Left: Eye)  Patient location during evaluation: PACU Anesthesia Type: MAC Level of consciousness: awake and alert Pain management: pain level controlled Vital Signs Assessment: post-procedure vital signs reviewed and stable Respiratory status: spontaneous breathing, nonlabored ventilation, respiratory function stable and patient connected to nasal cannula oxygen Cardiovascular status: stable and blood pressure returned to baseline Postop Assessment: no apparent nausea or vomiting Anesthetic complications: no   There were no known notable events for this encounter.   Last Vitals:  Vitals:   11/15/21 1202 11/15/21 1208  BP: 105/73 119/64  Pulse: 80   Resp: 18   Temp: (!) 36.1 C (!) 36.4 C  SpO2: 94%     Last Pain:  Vitals:   11/15/21 1208  TempSrc:   PainSc: 0-No pain                 Martha Clan

## 2021-11-15 NOTE — Anesthesia Preprocedure Evaluation (Signed)
Anesthesia Evaluation  Patient identified by MRN, date of birth, ID band Patient awake    Reviewed: Allergy & Precautions, NPO status , Patient's Chart, lab work & pertinent test results  History of Anesthesia Complications Negative for: history of anesthetic complications  Airway Mallampati: IV   Neck ROM: Full    Dental   Infected molar:   Pulmonary asthma , sleep apnea , former smoker,    Pulmonary exam normal breath sounds clear to auscultation       Cardiovascular hypertension, Normal cardiovascular exam+ dysrhythmias (bradycardia)  Rhythm:Regular Rate:Normal     Neuro/Psych  Headaches, PSYCHIATRIC DISORDERS Anxiety Depression Chronic pain  Neuromuscular disease (neuropathy)    GI/Hepatic GERD  ,S/p gastric sleeve   Endo/Other  Hypothyroidism Obesity   Renal/GU negative Renal ROS     Musculoskeletal  (+) Arthritis ,   Abdominal   Peds  Hematology  (+) Blood dyscrasia, anemia ,   Anesthesia Other Findings   Reproductive/Obstetrics                             Anesthesia Physical  Anesthesia Plan  ASA: 2  Anesthesia Plan: MAC   Post-op Pain Management:    Induction: Intravenous  PONV Risk Score and Plan: 2 and Treatment may vary due to age or medical condition, Midazolam and TIVA  Airway Management Planned: Natural Airway and Nasal Cannula  Additional Equipment:   Intra-op Plan:   Post-operative Plan:   Informed Consent: I have reviewed the patients History and Physical, chart, labs and discussed the procedure including the risks, benefits and alternatives for the proposed anesthesia with the patient or authorized representative who has indicated his/her understanding and acceptance.     Dental advisory given  Plan Discussed with: CRNA  Anesthesia Plan Comments: (LMA/GETA backup discussed.  Patient consented for risks of anesthesia including but not limited to:  -  adverse reactions to medications - damage to eyes, teeth, lips or other oral mucosa - nerve damage due to positioning  - sore throat or hoarseness - damage to heart, brain, nerves, lungs, other parts of body or loss of life  Informed patient about role of CRNA in peri- and intra-operative care.  Patient voiced understanding.)        Anesthesia Quick Evaluation

## 2021-11-15 NOTE — Op Note (Signed)
OPERATIVE NOTE  Jennifer [name] [id number] 11/15/2021   PREOPERATIVE DIAGNOSIS:  Nuclear sclerotic cataract left eye.  H25.12   POSTOPERATIVE DIAGNOSIS:    Nuclear sclerotic cataract left eye.     PROCEDURE:  Phacoemusification with posterior chamber intraocular lens placement of the left eye   LENS:   Implant Name Type Inv. Item Serial No. Manufacturer Lot No. LRB No. Used Action  LENS IOL TECNIS EYHANCE 20.5 - [id number] Intraocular Lens LENS IOL TECNIS EYHANCE 20.5 [id number] [employer]  Left 1 Implanted      Procedure(s) with comments: CATARACT EXTRACTION PHACO AND INTRAOCULAR LENS PLACEMENT (IOC) LEFT 4.94 00:32.3 (Left) - sleep apnea  DIB00 +20.5   SURGEON:  Benay Pillow, MD, MPH   ANESTHESIA:  Topical with tetracaine drops augmented with 1% preservative-free intracameral lidocaine.  ESTIMATED BLOOD LOSS: <1 mL   COMPLICATIONS:  None.   DESCRIPTION OF PROCEDURE:  The patient was identified in the holding room and transported to the operating room and placed in the supine position under the operating microscope.  The left eye was identified as the operative eye and it was prepped and draped in the usual sterile ophthalmic fashion.   A 1.0 millimeter clear-corneal paracentesis was made at the 5:00 position. 0.5 ml of preservative-free 1% lidocaine with epinephrine was injected into the anterior chamber.  The anterior chamber was filled with viscoelastic.  A 2.4 millimeter keratome was used to make a near-clear corneal incision at the 2:00 position.  A curvilinear capsulorrhexis was made with a cystotome and capsulorrhexis forceps.  Balanced salt solution was used to hydrodissect and hydrodelineate the nucleus.   Phacoemulsification was then used in stop and chop fashion to remove the lens nucleus and epinucleus.  The remaining cortex was then removed using the irrigation and aspiration handpiece. Viscoelastic was then placed into the capsular bag to distend it for lens  placement.  A lens was then injected into the capsular bag.  The remaining viscoelastic was aspirated.   Wounds were hydrated with balanced salt solution.  The anterior chamber was inflated to a physiologic pressure with balanced salt solution.  Intracameral vigamox 0.1 mL undiltued was injected into the eye and a drop placed onto the ocular surface.  No wound leaks were noted.  The patient was taken to the recovery room in stable condition without complications of anesthesia or surgery  Benay Pillow 11/15/2021, 12:01 PM

## 2021-11-15 NOTE — Transfer of Care (Signed)
Immediate Anesthesia Transfer of Care Note  Patient: Jennifer [name]  Procedure(s) Performed: CATARACT EXTRACTION PHACO AND INTRAOCULAR LENS PLACEMENT (IOC) LEFT 4.94 00:32.3 (Left: Eye)  Patient Location: PACU  Anesthesia Type: MAC  Level of Consciousness: awake, alert  and patient cooperative  Airway and Oxygen Therapy: Patient Spontanous Breathing and Patient connected to supplemental oxygen  Post-op Assessment: Post-op Vital signs reviewed, Patient's Cardiovascular Status Stable, Respiratory Function Stable, Patent Airway and No signs of Nausea or vomiting  Post-op Vital Signs: Reviewed and stable  Complications: There were no known notable events for this encounter.

## 2021-11-15 NOTE — H&P (Signed)
[facility]   Primary Care Physician:  [name], FNP Ophthalmologist: Dr. Benay Pillow  Pre-Procedure History & Physical: HPI:  Jennifer [name] is a 71 y.o. female here for cataract surgery.   Past Medical History:  Diagnosis Date   Anemia    Anxiety    Bariatric surgery status    Complication of anesthesia    Diificulty breathing for about 15 minutes after bariatric surgery   Constipation    COVID-19    Dysrhythmia    Elevated liver enzymes    GERD (gastroesophageal reflux disease)    Hemorrhoids    Herpes genitalis    High cholesterol    Hyperlipidemia    Hypertension    Hypothyroidism    IDA (iron deficiency anemia) 02/09/2021   Neuropathy    Osteoarthritis    Sleep apnea    CPAP    Past Surgical History:  Procedure Laterality Date   ABDOMINAL HYSTERECTOMY     total for fibroids no h/o abnormal pap   bariatric sleeve  2015   BREAST EXCISIONAL BIOPSY Left 1998   carpal tunnel repair     CATARACT EXTRACTION W/PHACO Right 11/01/2021   Procedure: CATARACT EXTRACTION PHACO AND INTRAOCULAR LENS PLACEMENT ([place]) RIGHT;  Surgeon: Eulogio Bear, MD;  Location: [place];  Service: Ophthalmology;  Laterality: Right;  sleep apnea 4.95 00:49.7   COLONOSCOPY WITH PROPOFOL N/A 02/11/2016   Procedure: COLONOSCOPY WITH PROPOFOL;  Surgeon: Jonathon Bellows, MD;  Location: [facility] ENDOSCOPY;  Service: Endoscopy;  Laterality: N/A;   COLONOSCOPY WITH PROPOFOL N/A 10/01/2019   Procedure: COLONOSCOPY WITH PROPOFOL;  Surgeon: Jonathon Bellows, MD;  Location: [facility] ENDOSCOPY;  Service: Gastroenterology;  Laterality: N/A;   COLONOSCOPY WITH PROPOFOL N/A 11/19/2020   Procedure: COLONOSCOPY WITH PROPOFOL;  Surgeon: Jonathon Bellows, MD;  Location: [facility] ENDOSCOPY;  Service: Gastroenterology;  Laterality: N/A;   ESOPHAGOGASTRODUODENOSCOPY (EGD) WITH PROPOFOL N/A 12/02/2019   Procedure: ESOPHAGOGASTRODUODENOSCOPY (EGD) WITH PROPOFOL;  Surgeon: Jonathon Bellows, MD;  Location: [facility]  ENDOSCOPY;  Service: Gastroenterology;  Laterality: N/A;   ESOPHAGOGASTRODUODENOSCOPY (EGD) WITH PROPOFOL N/A 11/19/2020   Procedure: ESOPHAGOGASTRODUODENOSCOPY (EGD) WITH PROPOFOL;  Surgeon: Jonathon Bellows, MD;  Location: [facility] ENDOSCOPY;  Service: Gastroenterology;  Laterality: N/A;   FLEXIBLE SIGMOIDOSCOPY N/A 02/06/2021   Procedure: FLEXIBLE SIGMOIDOSCOPY;  Surgeon: [name], DO;  Location: [facility] ENDOSCOPY;  Service: Gastroenterology;  Laterality: N/A;   GIVENS CAPSULE STUDY N/A 06/07/2021   Procedure: GIVENS CAPSULE STUDY;  Surgeon: Lin Landsman, MD;  Location: [facility] ENDOSCOPY;  Service: Gastroenterology;  Laterality: N/A;   GIVENS CAPSULE STUDY N/A 06/09/2021   Procedure: GIVENS CAPSULE STUDY;  Surgeon: Lin Landsman, MD;  Location: [facility] ENDOSCOPY;  Service: Gastroenterology;  Laterality: N/A;   HEMORRHOID SURGERY     LAPAROSCOPIC GASTRIC RESTRICTIVE DUODENAL PROCEDURE (DUODENAL SWITCH) Bilateral    2020    Prior to Admission medications   Medication Sig Start Date End Date Taking? Authorizing Provider  acetaminophen (TYLENOL) 325 MG tablet Take 2 tablets (650 mg total) by mouth every 6 (six) hours as needed for mild pain (or Fever >/= 101). 06/12/20  Yes Nolberto Hanlon, MD  acyclovir (ZOVIRAX) 400 MG tablet TAKE 1 TABLET BY MOUTH EVERY DAY 08/21/21  Yes Dutch Quint B, FNP  calcium citrate (CALCITRATE - DOSED IN MG ELEMENTAL CALCIUM) 950 (200 Ca) MG tablet Take 200 mg of elemental calcium by mouth daily.   Yes [provider]  fexofenadine (ALLEGRA ALLERGY) 180 MG tablet Take 1 tablet (180 mg total) by mouth  daily. 05/14/21  Yes Arnett, Yvetta Coder, FNP  fluticasone (FLONASE) 50 MCG/ACT nasal spray Place 2 sprays into both nostrils daily. 02/02/21  Yes Dutch Quint B, FNP  gabapentin (NEURONTIN) 300 MG capsule Take '600mg'$  qam, '600mg'$  every afternoon and '300mg'$  qhs. 08/31/21  Yes Arnett, Yvetta Coder, FNP  hydrocortisone (ANUCORT-HC) 25 MG suppository UNWRAP AND INSERT 1  SUPPOSITORY RECTALLY TWICE A DAY 09/15/21  Yes Arnett, Yvetta Coder, FNP  ketoconazole (NIZORAL) 2 % cream Apply 1 application topically daily. Apply on bottom of foot and thin layer between toes 02/10/21  Yes McDonald, Stephan Minister, DPM  levothyroxine (SYNTHROID) 50 MCG tablet TAKE 1 TABLET BY MOUTH EVERY DAY 08/13/21  Yes Arnett, Yvetta Coder, FNP  Multiple Vitamin (MULTIVITAMIN WITH MINERALS) TABS tablet Take 1 tablet by mouth daily.   Yes [provider]  pantoprazole (PROTONIX) 40 MG tablet Take 1 tablet (40 mg total) by mouth 2 (two) times daily. 09/22/21  Yes Jonathon Bellows, MD  traZODone (DESYREL) 50 MG tablet TAKE 1/2 TO 1 TABLET BY MOUTH AT BEDTIME AS NEEDED FOR SLEEP. 11/10/21  Yes Arnett, Yvetta Coder, FNP  triamterene-hydrochlorothiazide (DYAZIDE) 37.5-25 MG capsule TAKE 1 EACH (1 CAPSULE TOTAL) BY MOUTH DAILY. 09/13/21  Yes Arnett, Yvetta Coder, FNP  clotrimazole (LOTRIMIN) 1 % cream Apply 1 Application topically 2 (two) times daily. To right foot Patient not taking: Reported on 10/25/2021 08/31/21   [name], FNP  DULoxetine (CYMBALTA) 30 MG capsule Take 1 capsule (30 mg total) by mouth daily. Patient not taking: Reported on 10/25/2021 09/15/21   [name], FNP  lidocaine (XYLOCAINE) 5 % ointment APPLY TOPICALLY 1 APPLICATION AS NEEDED 20/94/70   Felipa Furnace, DPM  losartan (COZAAR) 100 MG tablet TAKE 1 TABLET BY MOUTH EVERYDAY AT BEDTIME 10/15/21   [name], FNP  sodium chloride (OCEAN) 0.65 % SOLN nasal spray Place 2 sprays into both nostrils as needed for congestion. 01/21/21   McLean-Scocuzza, Nino Glow, MD    Allergies as of 09/09/2021 - Review Complete 09/07/2021  Allergen Reaction Noted   Celecoxib Hives 07/25/2016   Hydrocodone Nausea Only 06/02/2020   Pollen extract     Sodium ferric gluconate [ferrous gluconate]  11/20/2020   Nasacort [triamcinolone] Other (See Comments) 12/07/2015   Vicodin [hydrocodone-acetaminophen] Nausea Only 09/03/2014    Family  History  Problem Relation Age of Onset   Breast cancer Sister 73       materal 1/2 sister   Hypertension Sister    Hypertension Mother    Heart disease Father    Hypertension Brother     Social History   Socioeconomic History   Marital status: Married    Spouse name: Not on file   Number of children: Not on file   Years of education: Not on file   Highest education level: Not on file  Occupational History   Not on file  Tobacco Use   Smoking status: Former    Packs/day: 1.50    Years: 24.00    Total pack years: 36.00    Types: Cigarettes    Quit date: 1993    Years since quitting: 30.8   Smokeless tobacco: Never   Tobacco comments:    quit 1995.   Vaping Use   Vaping Use: Never used  Substance and Sexual Activity   Alcohol use: Yes    Alcohol/week: 1.0 standard drink of alcohol    Types: 1 Glasses of wine per week    Comment: DAILY   Drug  use: No   Sexual activity: Not Currently    Birth control/protection: Surgical    Comment: Hysterectomy  Other Topics Concern   Not on file  Social History Narrative   Lives in [place].    Married.    Retired 2015, Interior and spatial designer.    One son; granddaughter.    Left handed    Caffeine- decaf coffee.    Social Determinants of Health   Financial Resource Strain: Low Risk  (09/28/2021)   Overall Financial Resource Strain (CARDIA)    Difficulty of Paying Living Expenses: Not hard at all  Food Insecurity: No Food Insecurity (09/28/2021)   Hunger Vital Sign    Worried About Running Out of Food in the Last Year: Never true    Ran Out of Food in the Last Year: Never true  Transportation Needs: No Transportation Needs (09/28/2021)   PRAPARE - Hydrologist (Medical): No    Lack of Transportation (Non-Medical): No  Physical Activity: Insufficiently Active (09/06/2019)   Exercise Vital Sign    Days of Exercise per Week: 3 days    Minutes of Exercise per Session: 20 min  Stress: No Stress  Concern Present (09/28/2021)   [place]    Feeling of Stress : Not at all  Social Connections: Unknown (09/28/2021)   Social Connection and Isolation Panel [NHANES]    Frequency of Communication with Friends and Family: More than three times a week    Frequency of Social Gatherings with Friends and Family: More than three times a week    Attends Religious Services: Not on file    Active Member of [place] or Organizations: Not on file    Attends Archivist Meetings: Not on file    Marital Status: Married  Intimate Partner Violence: Not At Risk (09/28/2021)   Humiliation, Afraid, Rape, and Kick questionnaire    Fear of Current or Ex-Partner: No    Emotionally Abused: No    Physically Abused: No    Sexually Abused: No    Review of Systems: See HPI, otherwise negative ROS  Physical Exam: BP 122/71   Pulse 67   Temp (!) 97.3 F (36.3 C) (Temporal)   Resp 16   Ht 5' 0.98" (1.549 m)   Wt 82.3 kg   SpO2 95%   BMI 34.32 kg/m  General:   Alert, cooperative in NAD Head:  Normocephalic and atraumatic. Respiratory:  Normal work of breathing. Cardiovascular:  RRR  Impression/Plan: [name] is here for cataract surgery.  Risks, benefits, limitations, and alternatives regarding cataract surgery have been reviewed with the patient.  Questions have been answered.  All parties agreeable.   Benay Pillow, MD  11/15/2021, 11:36 AM

## 2021-11-16 ENCOUNTER — Ambulatory Visit: Payer: Medicare Other

## 2021-11-16 ENCOUNTER — Encounter: Payer: Self-pay | Admitting: Ophthalmology

## 2021-11-16 ENCOUNTER — Ambulatory Visit: Payer: Medicare Other | Admitting: Oncology

## 2021-11-17 ENCOUNTER — Emergency Department
Admission: EM | Admit: 2021-11-17 | Discharge: 2021-11-17 | Disposition: A | Discharge status: Home / Self Care | Payer: Medicare Other | Attending: Student in an Organized Health Care Education/Training Program | Admitting: Student in an Organized Health Care Education/Training Program

## 2021-11-17 ENCOUNTER — Other Ambulatory Visit
Admission: RE | Admit: 2021-11-17 | Discharge: 2021-11-17 | Disposition: A | Discharge status: Home / Self Care | Payer: Medicare Other | Source: Ambulatory Visit | Attending: Internal Medicine | Admitting: Internal Medicine

## 2021-11-17 ENCOUNTER — Ambulatory Visit: Payer: Medicare Other | Admitting: Podiatry

## 2021-11-17 ENCOUNTER — Emergency Department: Payer: Medicare Other

## 2021-11-17 ENCOUNTER — Other Ambulatory Visit: Payer: Self-pay

## 2021-11-17 DIAGNOSIS — M79662 Pain in left lower leg: Secondary | ICD-10-CM | POA: Insufficient documentation

## 2021-11-17 DIAGNOSIS — M5030 Other cervical disc degeneration, unspecified cervical region: Secondary | ICD-10-CM | POA: Diagnosis not present

## 2021-11-17 DIAGNOSIS — R200 Anesthesia of skin: Secondary | ICD-10-CM | POA: Diagnosis not present

## 2021-11-17 DIAGNOSIS — M48062 Spinal stenosis, lumbar region with neurogenic claudication: Secondary | ICD-10-CM | POA: Diagnosis not present

## 2021-11-17 DIAGNOSIS — R079 Chest pain, unspecified: Secondary | ICD-10-CM | POA: Diagnosis not present

## 2021-11-17 DIAGNOSIS — R202 Paresthesia of skin: Secondary | ICD-10-CM | POA: Diagnosis not present

## 2021-11-17 DIAGNOSIS — R7989 Other specified abnormal findings of blood chemistry: Secondary | ICD-10-CM | POA: Diagnosis not present

## 2021-11-17 DIAGNOSIS — R051 Acute cough: Secondary | ICD-10-CM | POA: Insufficient documentation

## 2021-11-17 DIAGNOSIS — R791 Abnormal coagulation profile: Secondary | ICD-10-CM | POA: Insufficient documentation

## 2021-11-17 DIAGNOSIS — R911 Solitary pulmonary nodule: Secondary | ICD-10-CM | POA: Diagnosis not present

## 2021-11-17 DIAGNOSIS — M5416 Radiculopathy, lumbar region: Secondary | ICD-10-CM | POA: Diagnosis not present

## 2021-11-17 DIAGNOSIS — R42 Dizziness and giddiness: Secondary | ICD-10-CM | POA: Diagnosis not present

## 2021-11-17 DIAGNOSIS — R059 Cough, unspecified: Secondary | ICD-10-CM | POA: Diagnosis not present

## 2021-11-17 DIAGNOSIS — R0602 Shortness of breath: Secondary | ICD-10-CM | POA: Insufficient documentation

## 2021-11-17 DIAGNOSIS — M79661 Pain in right lower leg: Secondary | ICD-10-CM | POA: Diagnosis not present

## 2021-11-17 DIAGNOSIS — R0789 Other chest pain: Secondary | ICD-10-CM | POA: Insufficient documentation

## 2021-11-17 DIAGNOSIS — M5136 Other intervertebral disc degeneration, lumbar region: Secondary | ICD-10-CM | POA: Diagnosis not present

## 2021-11-17 DIAGNOSIS — M5412 Radiculopathy, cervical region: Secondary | ICD-10-CM | POA: Diagnosis not present

## 2021-11-17 LAB — CBC
HCT: 33.5 % — ABNORMAL LOW (ref 36.0–46.0)
Hemoglobin: 10.7 g/dL — ABNORMAL LOW (ref 12.0–15.0)
MCH: 24.1 pg — ABNORMAL LOW (ref 26.0–34.0)
MCHC: 31.9 g/dL (ref 30.0–36.0)
MCV: 75.5 fL — ABNORMAL LOW (ref 80.0–100.0)
Platelets: 190 10*3/uL (ref 150–400)
RBC: 4.44 MIL/uL (ref 3.87–5.11)
RDW: 17.7 % — ABNORMAL HIGH (ref 11.5–15.5)
WBC: 5.9 10*3/uL (ref 4.0–10.5)
nRBC: 0 % (ref 0.0–0.2)

## 2021-11-17 LAB — BASIC METABOLIC PANEL
Anion gap: 9 (ref 5–15)
BUN: 6 mg/dL — ABNORMAL LOW (ref 8–23)
CO2: 25 mmol/L (ref 22–32)
Calcium: 8.8 mg/dL — ABNORMAL LOW (ref 8.9–10.3)
Chloride: 98 mmol/L (ref 98–111)
Creatinine, Ser: 0.68 mg/dL (ref 0.44–1.00)
GFR, Estimated: >60 mL/min (ref >60)
Glucose, Bld: 110 mg/dL — ABNORMAL HIGH (ref 70–99)
Potassium: 4.1 mmol/L (ref 3.5–5.1)
Sodium: 132 mmol/L — ABNORMAL LOW (ref 135–145)

## 2021-11-17 LAB — D-DIMER, QUANTITATIVE
D-Dimer, Quant: 1.28 ug/mL-FEU — ABNORMAL HIGH (ref 0.00–0.50)
D-Dimer, Quant: 1.38 ug/mL-FEU — ABNORMAL HIGH (ref 0.00–0.50)

## 2021-11-17 LAB — TROPONIN I (HIGH SENSITIVITY)
Troponin I (High Sensitivity): 8 ng/L (ref <18)
Troponin I (High Sensitivity): 9 ng/L (ref <18)

## 2021-11-17 LAB — BRAIN NATRIURETIC PEPTIDE: B Natriuretic Peptide: 5.3 pg/mL (ref 0.0–100.0)

## 2021-11-17 MED ORDER — IOHEXOL 350 MG/ML SOLN
75.0000 mL | Freq: Once | INTRAVENOUS | Status: AC | PRN
  Administered 2021-11-17: 58 mL via INTRAVENOUS

## 2021-11-17 MED ORDER — TRAMADOL HCL 50 MG PO TABS: 50.0000 mg | ORAL_TABLET | Freq: Four times a day (QID) | ORAL | 0 refills | Status: DC | PRN

## 2021-11-17 MED ORDER — ONDANSETRON HCL 4 MG/2ML IJ SOLN
4.0000 mg | Freq: Once | INTRAMUSCULAR | Status: AC
  Administered 2021-11-17: 4 mg via INTRAVENOUS
  Filled 2021-11-17: qty 2

## 2021-11-17 MED ORDER — MORPHINE SULFATE (PF) 4 MG/ML IV SOLN
4.0000 mg | INTRAVENOUS | Status: DC | PRN
  Administered 2021-11-17: 4 mg via INTRAVENOUS
  Filled 2021-11-17: qty 1

## 2021-11-17 NOTE — ED Notes (Signed)
Pt to ED complains of chest pain and SOB, worse with activity, since about 3 weeks ago. L neck pain. Denies pain in arm or jaw. Chest pain is central, dull, worse with inspiration. Hx pleurisy.

## 2021-11-17 NOTE — ED Triage Notes (Signed)
Pt was seen at the walk in clinic over at [facility]. Pt sts that she has been having CP and SOB x1 week. Per note by clinic, Pt has elevated d-dimer and LFT's, anemia with hx of GI bleed and hyponatremia.

## 2021-11-17 NOTE — ED Provider Notes (Signed)
[facility] Provider Note    Event Date/Time   First MD Initiated Contact with Patient 11/17/21 1246     (approximate)   History   Chest Pain   HPI  Jennifer [name] is a 71 y.o. female presents to the ER for evaluation of several weeks of chest pain shortness of breath.  States it is intermittent in nature.  Also has some achiness in bilateral lower extremities.  Denies any numbness or tingling.  No pain ripping or tearing through to her back.  No fevers or chills.  Patient went to walk-in clinic today had D-dimer that was ordered which was elevated sent to the ER for further evaluation.     Physical Exam   Triage Vital Signs: ED Triage Vitals  Enc Vitals Group     BP 11/17/21 1157 (!) 153/74     Pulse Rate 11/17/21 1157 73     Resp 11/17/21 1157 16     Temp 11/17/21 1157 98.6 F (37 C)     Temp src --      SpO2 11/17/21 1157 96 %     Weight 11/17/21 1158 184 lb (83.5 kg)     Height 11/17/21 1158 '5\' 1"'$  (1.549 m)     Head Circumference --      Peak Flow --      Pain Score 11/17/21 1204 5     Pain Loc --      Pain Edu? --      Excl. in [place]? --     Most recent vital signs: Vitals:   11/17/21 1306 11/17/21 1430  BP: 136/66 137/63  Pulse: 70 75  Resp: 12 (!) 25  Temp:    SpO2: 100% 96%     Constitutional: Alert  Eyes: Conjunctivae are normal.  Head: Atraumatic. Nose: No congestion/rhinnorhea. Mouth/Throat: Mucous membranes are moist.   Neck: Painless ROM.  Cardiovascular:   Good peripheral circulation. Respiratory: Normal respiratory effort.  No retractions.  Gastrointestinal: Soft and nontender.  Musculoskeletal:  no deformity Neurologic:  MAE spontaneously. No gross focal neurologic deficits are appreciated.  Skin:  Skin is warm, dry and intact. No rash noted. Psychiatric: Mood and affect are normal. Speech and behavior are normal.    ED Results / Procedures / Treatments   Labs (all labs ordered are listed, but only  abnormal results are displayed) Labs Reviewed  BASIC METABOLIC PANEL - Abnormal; Notable for the following components:      Result Value   Sodium 132 (*)    Glucose, Bld 110 (*)    BUN 6 (*)    Calcium 8.8 (*)    All other components within normal limits  CBC - Abnormal; Notable for the following components:   Hemoglobin 10.7 (*)    HCT 33.5 (*)    MCV 75.5 (*)    MCH 24.1 (*)    RDW 17.7 (*)    All other components within normal limits  D-DIMER, QUANTITATIVE - Abnormal; Notable for the following components:   D-Dimer, Quant 1.38 (*)    All other components within normal limits  TROPONIN I (HIGH SENSITIVITY)  TROPONIN I (HIGH SENSITIVITY)     EKG  ED ECG REPORT I, Merlyn Lot, the attending physician, personally viewed and interpreted this ECG.   Date: 11/17/2021  EKG Time: 12:01  Rate: 70  Rhythm: sinus  Axis: normal  Intervals:normal  ST&T Change: no stemi, no depressions    RADIOLOGY Please see ED Course for my review  and interpretation.  I personally reviewed all radiographic images ordered to evaluate for the above acute complaints and reviewed radiology reports and findings.  These findings were personally discussed with the patient.  Please see medical record for radiology report.    PROCEDURES:  Critical Care performed:   Procedures   MEDICATIONS ORDERED IN ED: Medications  morphine (PF) 4 MG/ML injection 4 mg (4 mg Intravenous Given 11/17/21 1337)  ondansetron (ZOFRAN) injection 4 mg (4 mg Intravenous Given 11/17/21 1337)  iohexol (OMNIPAQUE) 350 MG/ML injection 75 mL (58 mLs Intravenous Contrast Given 11/17/21 1352)     IMPRESSION / MDM / ASSESSMENT AND PLAN / ED COURSE  I reviewed the triage vital signs and the nursing notes.                              Differential diagnosis includes, but is not limited to, ACS, pericarditis, esophagitis, boerhaaves, pe, dissection, pna, bronchitis, costochondritis  Patient presenting to the ER for  evaluation of symptoms as described above.  Based on symptoms, risk factors and considered above differential, this presenting complaint could reflect a potentially life-threatening illness therefore the patient will be placed on continuous pulse oximetry and telemetry for monitoring.  Laboratory evaluation will be sent to evaluate for the above complaints.  Patient has elevated D-dimer.  Have lower suspicion for ACS.  Chest x-ray on my review and interpretation without pneumothorax or consolidation.  Given elevated D-dimer will order CTA to further evaluate.  We will give IV morphine for pain per patient request.   Clinical Course as of 11/17/21 1514  Wed Nov 17, 2021  1412 CTA on my review and interpretation without any evidence of PE. [PR]  1508 Patient reassessed.  Repeat troponin negative.  Is not consistent with ACS.  No lower extremity DVT.  Exam is otherwise reassuring.  At this point he like she stable and appropriate for outpatient follow-up suspect most likely musculoskeletal strain or source of pain.  No sign of infectious process no sign of shingles. [PR]    Clinical Course User Index [PR] Merlyn Lot, MD   FINAL CLINICAL IMPRESSION(S) / ED DIAGNOSES   Final diagnoses:  Chest pain, unspecified type     Rx / DC Orders   ED Discharge Orders          Ordered    traMADol (ULTRAM) 50 MG tablet  Every 6 hours PRN        11/17/21 1513             Note:  This document was prepared using Dragon voice recognition software and may include unintentional dictation errors.    Merlyn Lot, MD 11/17/21 [phone]

## 2021-11-19 ENCOUNTER — Inpatient Hospital Stay: Payer: Medicare Other | Attending: Oncology

## 2021-11-19 DIAGNOSIS — I10 Essential (primary) hypertension: Secondary | ICD-10-CM | POA: Insufficient documentation

## 2021-11-19 DIAGNOSIS — D500 Iron deficiency anemia secondary to blood loss (chronic): Secondary | ICD-10-CM | POA: Diagnosis not present

## 2021-11-19 DIAGNOSIS — K649 Unspecified hemorrhoids: Secondary | ICD-10-CM | POA: Insufficient documentation

## 2021-11-19 DIAGNOSIS — E7800 Pure hypercholesterolemia, unspecified: Secondary | ICD-10-CM | POA: Diagnosis not present

## 2021-11-19 DIAGNOSIS — Z9884 Bariatric surgery status: Secondary | ICD-10-CM | POA: Diagnosis not present

## 2021-11-19 DIAGNOSIS — Z87891 Personal history of nicotine dependence: Secondary | ICD-10-CM | POA: Insufficient documentation

## 2021-11-19 DIAGNOSIS — R7401 Elevation of levels of liver transaminase levels: Secondary | ICD-10-CM | POA: Insufficient documentation

## 2021-11-19 DIAGNOSIS — E039 Hypothyroidism, unspecified: Secondary | ICD-10-CM | POA: Diagnosis not present

## 2021-11-19 DIAGNOSIS — Z79899 Other long term (current) drug therapy: Secondary | ICD-10-CM | POA: Diagnosis not present

## 2021-11-19 LAB — CBC WITH DIFFERENTIAL/PLATELET
Abs Immature Granulocytes: 0.01 10*3/uL (ref 0.00–0.07)
Basophils Absolute: 0 10*3/uL (ref 0.0–0.1)
Basophils Relative: 1 %
Eosinophils Absolute: 0 10*3/uL (ref 0.0–0.5)
Eosinophils Relative: 0 %
HCT: 31.4 % — ABNORMAL LOW (ref 36.0–46.0)
Hemoglobin: 9.9 g/dL — ABNORMAL LOW (ref 12.0–15.0)
Immature Granulocytes: 0 %
Lymphocytes Relative: 36 %
Lymphs Abs: 1.9 10*3/uL (ref 0.7–4.0)
MCH: 24.7 pg — ABNORMAL LOW (ref 26.0–34.0)
MCHC: 31.5 g/dL (ref 30.0–36.0)
MCV: 78.3 fL — ABNORMAL LOW (ref 80.0–100.0)
Monocytes Absolute: 0.6 10*3/uL (ref 0.1–1.0)
Monocytes Relative: 10 %
Neutro Abs: 2.8 10*3/uL (ref 1.7–7.7)
Neutrophils Relative %: 53 %
Platelets: 178 10*3/uL (ref 150–400)
RBC: 4.01 MIL/uL (ref 3.87–5.11)
RDW: 17.8 % — ABNORMAL HIGH (ref 11.5–15.5)
WBC: 5.3 10*3/uL (ref 4.0–10.5)
nRBC: 0 % (ref 0.0–0.2)

## 2021-11-19 LAB — IRON AND TIBC
Iron: 70 ug/dL (ref 28–170)
Saturation Ratios: 16 % (ref 10.4–31.8)
TIBC: 445 ug/dL (ref 250–450)
UIBC: 375 ug/dL

## 2021-11-19 LAB — FERRITIN: Ferritin: 39 ng/mL (ref 11–307)

## 2021-11-22 ENCOUNTER — Encounter: Payer: Self-pay | Admitting: Oncology

## 2021-11-22 ENCOUNTER — Inpatient Hospital Stay (HOSPITAL_BASED_OUTPATIENT_CLINIC_OR_DEPARTMENT_OTHER): Payer: Medicare Other | Admitting: Oncology

## 2021-11-22 ENCOUNTER — Inpatient Hospital Stay: Payer: Medicare Other

## 2021-11-22 VITALS — BP 123/71 | HR 66 | Temp 97.8°F | Resp 16 | Wt 188.6 lb

## 2021-11-22 DIAGNOSIS — E7800 Pure hypercholesterolemia, unspecified: Secondary | ICD-10-CM | POA: Diagnosis not present

## 2021-11-22 DIAGNOSIS — Z9884 Bariatric surgery status: Secondary | ICD-10-CM | POA: Diagnosis not present

## 2021-11-22 DIAGNOSIS — R7401 Elevation of levels of liver transaminase levels: Secondary | ICD-10-CM | POA: Diagnosis not present

## 2021-11-22 DIAGNOSIS — D500 Iron deficiency anemia secondary to blood loss (chronic): Secondary | ICD-10-CM

## 2021-11-22 DIAGNOSIS — K649 Unspecified hemorrhoids: Secondary | ICD-10-CM | POA: Diagnosis not present

## 2021-11-22 DIAGNOSIS — D62 Acute posthemorrhagic anemia: Secondary | ICD-10-CM

## 2021-11-22 DIAGNOSIS — E039 Hypothyroidism, unspecified: Secondary | ICD-10-CM | POA: Diagnosis not present

## 2021-11-22 MED ORDER — SODIUM CHLORIDE 0.9 % IV SOLN
200.0000 mg | Freq: Once | INTRAVENOUS | Status: AC
  Administered 2021-11-22: 200 mg via INTRAVENOUS
  Filled 2021-11-22: qty 10

## 2021-11-22 NOTE — Assessment & Plan Note (Signed)
#  Iron deficiency anemia due to chronic blood loss. Labs reviewed and discussed with patient. Hemoglobin has improved to 9.9, MCV 78, TIBC 487, iron saturation 16, ferritin 39. Given that she has ongoing blood loss from hemorrhoid, history of gastric bypass Recommend patient to get additional IV Venofer treatments x3.

## 2021-11-22 NOTE — Progress Notes (Signed)
Hematology/Oncology Progress note [phone] [phone]         Patient Care Team: [name], FNP as PCP - General (Family Medicine) Kennith Center, RD as Dietitian (Family Medicine) Earlie Server, MD as Consulting Physician (Hematology and Oncology)  ASSESSMENT & PLAN:   IDA (iron deficiency anemia) #Iron deficiency anemia due to chronic blood loss. Labs reviewed and discussed with patient. Hemoglobin has improved to 9.9, MCV 78, TIBC 487, iron saturation 16, ferritin 39. Given that she has ongoing blood loss from hemorrhoid, history of gastric bypass Recommend patient to get additional IV Venofer treatments x3.  Transaminitis Likely due to alcohol use.  Recommend US abdomen RUQ   Orders Placed This Encounter  Procedures   CBC with Differential/Platelet    Standing Status:   Future    Standing Expiration Date:   11/23/2022   Iron and TIBC    Standing Status:   Future    Standing Expiration Date:   11/23/2022   Ferritin    Standing Status:   Future    Standing Expiration Date:   11/23/2022   Retic Panel    Standing Status:   Future    Standing Expiration Date:   11/23/2022   Follow up in 6 weeks.  All questions were answered. The patient knows to call the clinic with any problems, questions or concerns.  Earlie Server, MD, PhD [facility] Hematology Oncology 11/22/2021   CHIEF COMPLAINTS/REASON FOR VISIT:  Follow-up for iron deficiency anemia.  HISTORY OF PRESENTING ILLNESS:   Jennifer [name] is a  71 y.o.  female with PMH listed below was seen in consultation at the request of  [name], FNP  for evaluation of iron deficiency anemia  Patient was admitted from 02/05/2021 - 02/06/2021 due to symptomatic anemia with bright red blood per rectum.  Initial hemoglobin at presentation was 5.9.  Patient was transfused with 2 units of PRBC.  Also received 1 dose of IV Venofer treatments.  Patient had a flexible sigmoidoscopy by Dr. Virgina Jock.   Findings of nonbleeding hemorrhoids.  Patient follows up with Dr. [name] outpatient.  Patient was previously taking aspirin which has been discontinued.  Patient had hospitalization from 11/17/2020 - 11/19/2020 due to symptomatic anemia secondary to intermittent BPBPR/melena.  She received blood transfusion during that hospitalization.  She also had a reaction to ferrous gluconate. She had EGD and colonoscopy on 11/19/2020 with no notable findings of active bleeding.   Patient has a history of bariatric surgery.  INTERVAL HISTORY Jennifer [name] is a 71 y.o. female who has above history reviewed by me today presents for follow up visit for iron deficiency Patient is status post IV Venofer treatments.   Fatigue is slightly better.  She has no new complaints.  She continues to have small amount of blood in the stool due to hemorrhoids.   Review of Systems  Constitutional:  Positive for fatigue. Negative for appetite change, chills and fever.  HENT:   Negative for hearing loss and voice change.   Eyes:  Negative for eye problems.  Respiratory:  Negative for chest tightness and cough.   Cardiovascular:  Negative for chest pain.  Gastrointestinal:  Positive for blood in stool. Negative for abdominal distention and abdominal pain.  Endocrine: Negative for hot flashes.  Genitourinary:  Negative for difficulty urinating and frequency.   Musculoskeletal:  Negative for arthralgias.  Skin:  Negative for itching and rash.  Neurological:  Negative for extremity weakness.  Hematological:  Negative for  adenopathy.  Psychiatric/Behavioral:  Negative for confusion.     MEDICAL HISTORY:  Past Medical History:  Diagnosis Date   Anemia    Anxiety    Bariatric surgery status    Complication of anesthesia    Diificulty breathing for about 15 minutes after bariatric surgery   Constipation    COVID-19    Dysrhythmia    Elevated liver enzymes    GERD (gastroesophageal reflux disease)    Hemorrhoids     Herpes genitalis    High cholesterol    Hyperlipidemia    Hypertension    Hypothyroidism    IDA (iron deficiency anemia) 02/09/2021   Neuropathy    Osteoarthritis    Sleep apnea    CPAP    SURGICAL HISTORY: Past Surgical History:  Procedure Laterality Date   ABDOMINAL HYSTERECTOMY     total for fibroids no h/o abnormal pap   bariatric sleeve  2015   BREAST EXCISIONAL BIOPSY Left 1998   carpal tunnel repair     CATARACT EXTRACTION W/PHACO Right 11/01/2021   Procedure: CATARACT EXTRACTION PHACO AND INTRAOCULAR LENS PLACEMENT ([place]) RIGHT;  Surgeon: Eulogio Bear, MD;  Location: [place];  Service: Ophthalmology;  Laterality: Right;  sleep apnea 4.95 00:49.7   CATARACT EXTRACTION W/PHACO Left 11/15/2021   Procedure: CATARACT EXTRACTION PHACO AND INTRAOCULAR LENS PLACEMENT ([facility]) LEFT 4.94 00:32.3;  Surgeon: Eulogio Bear, MD;  Location: [place];  Service: Ophthalmology;  Laterality: Left;  sleep apnea   COLONOSCOPY WITH PROPOFOL N/A 02/11/2016   Procedure: COLONOSCOPY WITH PROPOFOL;  Surgeon: Jonathon Bellows, MD;  Location: [facility] ENDOSCOPY;  Service: Endoscopy;  Laterality: N/A;   COLONOSCOPY WITH PROPOFOL N/A 10/01/2019   Procedure: COLONOSCOPY WITH PROPOFOL;  Surgeon: Jonathon Bellows, MD;  Location: [facility] ENDOSCOPY;  Service: Gastroenterology;  Laterality: N/A;   COLONOSCOPY WITH PROPOFOL N/A 11/19/2020   Procedure: COLONOSCOPY WITH PROPOFOL;  Surgeon: Jonathon Bellows, MD;  Location: [facility] ENDOSCOPY;  Service: Gastroenterology;  Laterality: N/A;   ESOPHAGOGASTRODUODENOSCOPY (EGD) WITH PROPOFOL N/A 12/02/2019   Procedure: ESOPHAGOGASTRODUODENOSCOPY (EGD) WITH PROPOFOL;  Surgeon: Jonathon Bellows, MD;  Location: [facility] ENDOSCOPY;  Service: Gastroenterology;  Laterality: N/A;   ESOPHAGOGASTRODUODENOSCOPY (EGD) WITH PROPOFOL N/A 11/19/2020   Procedure: ESOPHAGOGASTRODUODENOSCOPY (EGD) WITH PROPOFOL;  Surgeon: Jonathon Bellows, MD;  Location: [facility] ENDOSCOPY;  Service:  Gastroenterology;  Laterality: N/A;   FLEXIBLE SIGMOIDOSCOPY N/A 02/06/2021   Procedure: FLEXIBLE SIGMOIDOSCOPY;  Surgeon: [name], DO;  Location: [facility] ENDOSCOPY;  Service: Gastroenterology;  Laterality: N/A;   GIVENS CAPSULE STUDY N/A 06/07/2021   Procedure: GIVENS CAPSULE STUDY;  Surgeon: Lin Landsman, MD;  Location: [facility] ENDOSCOPY;  Service: Gastroenterology;  Laterality: N/A;   GIVENS CAPSULE STUDY N/A 06/09/2021   Procedure: GIVENS CAPSULE STUDY;  Surgeon: Lin Landsman, MD;  Location: [facility] ENDOSCOPY;  Service: Gastroenterology;  Laterality: N/A;   HEMORRHOID SURGERY     LAPAROSCOPIC GASTRIC RESTRICTIVE DUODENAL PROCEDURE (DUODENAL SWITCH) Bilateral    2020    SOCIAL HISTORY: Social History   Socioeconomic History   Marital status: Married    Spouse name: Not on file   Number of children: Not on file   Years of education: Not on file   Highest education level: Not on file  Occupational History   Not on file  Tobacco Use   Smoking status: Former    Packs/day: 1.50    Years: 24.00    Total pack years: 36.00    Types: Cigarettes    Quit date: 59  Years since quitting: 30.8   Smokeless tobacco: Never   Tobacco comments:    quit 1995.   Vaping Use   Vaping Use: Never used  Substance and Sexual Activity   Alcohol use: Yes    Alcohol/week: 1.0 standard drink of alcohol    Types: 1 Glasses of wine per week    Comment: DAILY   Drug use: No   Sexual activity: Not Currently    Birth control/protection: Surgical    Comment: Hysterectomy  Other Topics Concern   Not on file  Social History Narrative   Lives in [place].    Married.    Retired 2015, Interior and spatial designer.    One son; granddaughter.    Left handed    Caffeine- decaf coffee.    Social Determinants of Health   Financial Resource Strain: Low Risk  (09/28/2021)   Overall Financial Resource Strain (CARDIA)    Difficulty of Paying Living Expenses: Not hard at all  Food  Insecurity: No Food Insecurity (09/28/2021)   Hunger Vital Sign    Worried About Running Out of Food in the Last Year: Never true    Ran Out of Food in the Last Year: Never true  Transportation Needs: No Transportation Needs (09/28/2021)   PRAPARE - Hydrologist (Medical): No    Lack of Transportation (Non-Medical): No  Physical Activity: Insufficiently Active (09/06/2019)   Exercise Vital Sign    Days of Exercise per Week: 3 days    Minutes of Exercise per Session: 20 min  Stress: No Stress Concern Present (09/28/2021)   [place]    Feeling of Stress : Not at all  Social Connections: Unknown (09/28/2021)   Social Connection and Isolation Panel [NHANES]    Frequency of Communication with Friends and Family: More than three times a week    Frequency of Social Gatherings with Friends and Family: More than three times a week    Attends Religious Services: Not on file    Active Member of [place] or Organizations: Not on file    Attends Archivist Meetings: Not on file    Marital Status: Married  Intimate Partner Violence: Not At Risk (09/28/2021)   Humiliation, Afraid, Rape, and Kick questionnaire    Fear of Current or Ex-Partner: No    Emotionally Abused: No    Physically Abused: No    Sexually Abused: No    FAMILY HISTORY: Family History  Problem Relation Age of Onset   Breast cancer Sister 34       materal 1/2 sister   Hypertension Sister    Hypertension Mother    Heart disease Father    Hypertension Brother     ALLERGIES:  is allergic to celecoxib, hydrocodone, pollen extract, sodium ferric gluconate [ferrous gluconate], nasacort [triamcinolone], and vicodin [hydrocodone-acetaminophen].  MEDICATIONS:  Current Outpatient Medications  Medication Sig Dispense Refill   acetaminophen (TYLENOL) 325 MG tablet Take 2 tablets (650 mg total) by mouth every 6 (six) hours as needed for  mild pain (or Fever >/= 101).     acyclovir (ZOVIRAX) 400 MG tablet TAKE 1 TABLET BY MOUTH EVERY DAY 30 tablet 5   calcium citrate (CALCITRATE - DOSED IN MG ELEMENTAL CALCIUM) 950 (200 Ca) MG tablet Take 200 mg of elemental calcium by mouth daily.     fexofenadine (ALLEGRA ALLERGY) 180 MG tablet Take 1 tablet (180 mg total) by mouth daily. 90 tablet 1  fluticasone (FLONASE) 50 MCG/ACT nasal spray Place 2 sprays into both nostrils daily. 16 g 0   gabapentin (NEURONTIN) 300 MG capsule Take '600mg'$  qam, '600mg'$  every afternoon and '300mg'$  qhs. 180 capsule 3   hydrocortisone (ANUCORT-HC) 25 MG suppository UNWRAP AND INSERT 1 SUPPOSITORY RECTALLY TWICE A DAY 12 suppository 2   levothyroxine (SYNTHROID) 50 MCG tablet TAKE 1 TABLET BY MOUTH EVERY DAY 30 tablet 5   losartan (COZAAR) 100 MG tablet TAKE 1 TABLET BY MOUTH EVERYDAY AT BEDTIME 30 tablet 2   Multiple Vitamin (MULTIVITAMIN WITH MINERALS) TABS tablet Take 1 tablet by mouth daily.     pantoprazole (PROTONIX) 40 MG tablet Take 1 tablet (40 mg total) by mouth 2 (two) times daily. 180 tablet 3   sodium chloride (OCEAN) 0.65 % SOLN nasal spray Place 2 sprays into both nostrils as needed for congestion. 30 mL 2   traZODone (DESYREL) 50 MG tablet TAKE 1/2 TO 1 TABLET BY MOUTH AT BEDTIME AS NEEDED FOR SLEEP. 90 tablet 3   triamterene-hydrochlorothiazide (DYAZIDE) 37.5-25 MG capsule TAKE 1 EACH (1 CAPSULE TOTAL) BY MOUTH DAILY. 30 capsule 5   amitriptyline (ELAVIL) 25 MG tablet TAKE 1 TABLET BY MOUTH EVERY NIGHT FOR 1 WEEK THEN INCREASE TO 2 TABLETS EVERY NIGHT (Patient not taking: Reported on 11/22/2021)     amLODipine (NORVASC) 5 MG tablet Take 1 tablet by mouth daily. (Patient not taking: Reported on 11/22/2021)     lidocaine (XYLOCAINE) 5 % ointment APPLY TOPICALLY 1 APPLICATION AS NEEDED (Patient not taking: Reported on 11/22/2021) 35.44 g 0   traMADol (ULTRAM) 50 MG tablet Take 1 tablet (50 mg total) by mouth every 6 (six) hours as needed. (Patient not  taking: Reported on 11/22/2021) 8 tablet 0   No current facility-administered medications for this visit.     PHYSICAL EXAMINATION: ECOG PERFORMANCE STATUS: 1 - Symptomatic but completely ambulatory Vitals:   11/22/21 1425  BP: 123/71  Pulse: 66  Resp: 16  Temp: 97.8 F (36.6 C)  SpO2: 98%   Filed Weights   11/22/21 1425  Weight: 188 lb 9.6 oz (85.5 kg)    Physical Exam Constitutional:      General: She is not in acute distress. HENT:     Head: Normocephalic and atraumatic.  Eyes:     General: No scleral icterus. Cardiovascular:     Rate and Rhythm: Normal rate and regular rhythm.     Heart sounds: Normal heart sounds.  Pulmonary:     Effort: Pulmonary effort is normal. No respiratory distress.     Breath sounds: No wheezing.  Abdominal:     General: Bowel sounds are normal. There is no distension.     Palpations: Abdomen is soft.  Musculoskeletal:        General: No deformity. Normal range of motion.     Cervical back: Normal range of motion and neck supple.  Skin:    General: Skin is warm and dry.     Findings: No erythema or rash.  Neurological:     Mental Status: She is alert and oriented to person, place, and time. Mental status is at baseline.     Cranial Nerves: No cranial nerve deficit.     Coordination: Coordination normal.  Psychiatric:        Mood and Affect: Mood normal.     LABORATORY DATA:  I have reviewed the data as listed    Latest Ref Rng & Units 11/19/2021   10:30 AM 11/17/2021   12:08  PM 08/19/2021    9:52 AM  CBC  WBC 4.0 - 10.5 K/uL 5.3  5.9  8.3   Hemoglobin 12.0 - 15.0 g/dL 9.9  10.7  9.1   Hematocrit 36.0 - 46.0 % 31.4  33.5  30.8   Platelets 150 - 400 K/uL 178  190  337       Latest Ref Rng & Units 11/17/2021   12:08 PM 08/31/2021   11:18 AM 06/09/2021    4:22 AM  CMP  Glucose 70 - 99 mg/dL 110  94  114   BUN 8 - 23 mg/dL '6  7  6   '$ Creatinine 0.44 - 1.00 mg/dL 0.68  0.66  0.76   Sodium 135 - 145 mmol/L 132  135  135    Potassium 3.5 - 5.1 mmol/L 4.1  5.1  3.0   Chloride 98 - 111 mmol/L 98  98  100   CO2 22 - 32 mmol/L '25  27  26   '$ Calcium 8.9 - 10.3 mg/dL 8.8  9.1  9.0     Lab Results  Component Value Date   IRON 70 11/19/2021   TIBC 445 11/19/2021   FERRITIN 39 11/19/2021     RADIOGRAPHIC STUDIES: I have personally reviewed the radiological images as listed and agreed with the findings in the report. US Venous Img Lower Bilateral  Result Date: 11/17/2021 CLINICAL DATA:  Elevated D-dimer EXAM: BILATERAL LOWER EXTREMITY VENOUS DOPPLER ULTRASOUND TECHNIQUE: Gray-scale sonography with compression, as well as color and duplex ultrasound, were performed to evaluate the deep venous system(s) from the level of the common femoral vein through the popliteal and proximal calf veins. COMPARISON:  None Available. FINDINGS: VENOUS Normal compressibility of the common femoral, superficial femoral, and popliteal veins, as well as the visualized calf veins. Visualized portions of profunda femoral vein and great saphenous vein unremarkable. No filling defects to suggest DVT on grayscale or color Doppler imaging. Doppler waveforms show normal direction of venous flow, normal respiratory plasticity and response to augmentation. OTHER None. Limitations: none IMPRESSION: Negative. Electronically Signed   By: Ulyses Jarred M.D.   On: 11/17/2021 14:52   CT Angio Chest PE W and/or Wo Contrast  Result Date: 11/17/2021 CLINICAL DATA:  Pulmonary embolus suspected EXAM: CT ANGIOGRAPHY CHEST WITH CONTRAST TECHNIQUE: Multidetector CT imaging of the chest was performed using the standard protocol during bolus administration of intravenous contrast. Multiplanar CT image reconstructions and MIPs were obtained to evaluate the vascular anatomy. RADIATION DOSE REDUCTION: This exam was performed according to the departmental dose-optimization program which includes automated exposure control, adjustment of the mA and/or kV according to patient  size and/or use of iterative reconstruction technique. CONTRAST:  65m OMNIPAQUE IOHEXOL 350 MG/ML SOLN COMPARISON:  None Available. FINDINGS: Cardiovascular: Normal heart size. No pericardial effusion. Normal caliber thoracic aorta with mild atherosclerotic disease. Dilated main pulmonary artery, measuring up to 3.5 cm. Mild coronary artery calcifications. No evidence of pulmonary embolus. Mediastinum/Nodes: Mildly patulous esophagus. Thyroid is unremarkable. No pathologically enlarged lymph nodes seen in the chest. Lungs/Pleura: Central airways are patent. Bibasilar atelectasis. Mild centrilobular emphysema. No consolidation, pleural effusion or pneumothorax. Small solid pulmonary nodule of the right upper lobe measuring 3 mm on series 2, image 72. Upper Abdomen: Postsurgical changes of the stomach. No acute abnormality. Musculoskeletal: No chest wall abnormality. No acute or significant osseous findings. Review of the MIP images confirms the above findings. IMPRESSION: 1. No evidence of pulmonary embolus or acute airspace opacity. 2. Dilated main pulmonary  artery, findings can be seen in the setting of pulmonary hypertension. 3. Small solid pulmonary nodule of the right upper lobe measuring 3 mm. No follow-up needed if patient is low-risk.This recommendation follows the consensus statement: Guidelines for Management of Incidental Pulmonary Nodules Detected on CT Images: From the Fleischner Society 2017; Radiology 2017; 284:228-243. 4. Aortic Atherosclerosis (ICD10-I70.0) and Emphysema (ICD10-J43.9). Electronically Signed   By: Yetta Glassman M.D.   On: 11/17/2021 14:10   DG Chest 2 View  Result Date: 11/17/2021 CLINICAL DATA:  Shortness of breath.  Chest pain. EXAM: CHEST - 2 VIEW COMPARISON:  PA chest and right rib radiographs 09/27/2021 FINDINGS: Cardiac silhouette and mediastinal contours are within normal limits. Mild calcification within the aortic arch. The lungs are clear. No pleural effusion or  pneumothorax. Moderate multilevel disc space narrowing and anterior endplate osteophytes of the midthoracic spine. Cholecystectomy clips. IMPRESSION: No acute cardiopulmonary disease process. Electronically Signed   By: Yvonne Kendall M.D.   On: 11/17/2021 12:31

## 2021-11-22 NOTE — Patient Instructions (Signed)

## 2021-11-22 NOTE — Assessment & Plan Note (Signed)
Likely due to alcohol use.  Recommend US abdomen RUQ

## 2021-11-23 ENCOUNTER — Telehealth: Payer: Self-pay

## 2021-11-23 NOTE — Telephone Encounter (Signed)
Unable to reach pt by phone, detailed VM left and mychart message sent with MD recommendation.

## 2021-11-23 NOTE — Telephone Encounter (Signed)
Pt called stating that, her oncologist wanted her to reach out to her PCP , so that her PCP can go over her blood work with her.

## 2021-11-23 NOTE — Telephone Encounter (Signed)
-----   Message from Earlie Server, MD sent at 11/22/2021 10:59 PM EST ----- I noticed that her liver enzymes are trending up. This was ordered by her pcp. Please check with her to see if pcp has recommended her to get any additional testing.  I think [name] RUQ is a good next step, please recommend her to discuss with  her pcp.

## 2021-11-24 NOTE — Telephone Encounter (Signed)
Sch appt with me

## 2021-11-24 NOTE — Telephone Encounter (Signed)
Spoke to pt and schedule f/up for 11/29/21 @ 11:30

## 2021-11-26 MED FILL — Iron Sucrose Inj 20 MG/ML (Fe Equiv): INTRAVENOUS | Qty: 10 | Status: AC

## 2021-11-29 ENCOUNTER — Telehealth: Payer: Self-pay

## 2021-11-29 ENCOUNTER — Ambulatory Visit: Payer: Medicare Other | Admitting: Family

## 2021-11-29 ENCOUNTER — Inpatient Hospital Stay: Payer: Medicare Other

## 2021-11-29 VITALS — BP 112/59 | HR 68 | Temp 97.9°F | Resp 16

## 2021-11-29 DIAGNOSIS — I2089 Other forms of angina pectoris: Secondary | ICD-10-CM | POA: Diagnosis not present

## 2021-11-29 DIAGNOSIS — R001 Bradycardia, unspecified: Secondary | ICD-10-CM | POA: Diagnosis not present

## 2021-11-29 DIAGNOSIS — R7401 Elevation of levels of liver transaminase levels: Secondary | ICD-10-CM | POA: Diagnosis not present

## 2021-11-29 DIAGNOSIS — E039 Hypothyroidism, unspecified: Secondary | ICD-10-CM | POA: Diagnosis not present

## 2021-11-29 DIAGNOSIS — R9431 Abnormal electrocardiogram [ECG] [EKG]: Secondary | ICD-10-CM | POA: Diagnosis not present

## 2021-11-29 DIAGNOSIS — K649 Unspecified hemorrhoids: Secondary | ICD-10-CM | POA: Diagnosis not present

## 2021-11-29 DIAGNOSIS — D62 Acute posthemorrhagic anemia: Secondary | ICD-10-CM

## 2021-11-29 DIAGNOSIS — R002 Palpitations: Secondary | ICD-10-CM | POA: Diagnosis not present

## 2021-11-29 DIAGNOSIS — E7800 Pure hypercholesterolemia, unspecified: Secondary | ICD-10-CM | POA: Diagnosis not present

## 2021-11-29 DIAGNOSIS — I872 Venous insufficiency (chronic) (peripheral): Secondary | ICD-10-CM | POA: Diagnosis not present

## 2021-11-29 DIAGNOSIS — R011 Cardiac murmur, unspecified: Secondary | ICD-10-CM | POA: Diagnosis not present

## 2021-11-29 DIAGNOSIS — D500 Iron deficiency anemia secondary to blood loss (chronic): Secondary | ICD-10-CM

## 2021-11-29 DIAGNOSIS — I890 Lymphedema, not elsewhere classified: Secondary | ICD-10-CM | POA: Diagnosis not present

## 2021-11-29 DIAGNOSIS — R000 Tachycardia, unspecified: Secondary | ICD-10-CM | POA: Diagnosis not present

## 2021-11-29 DIAGNOSIS — G4733 Obstructive sleep apnea (adult) (pediatric): Secondary | ICD-10-CM | POA: Diagnosis not present

## 2021-11-29 DIAGNOSIS — Z9884 Bariatric surgery status: Secondary | ICD-10-CM | POA: Diagnosis not present

## 2021-11-29 DIAGNOSIS — E785 Hyperlipidemia, unspecified: Secondary | ICD-10-CM | POA: Diagnosis not present

## 2021-11-29 DIAGNOSIS — R0609 Other forms of dyspnea: Secondary | ICD-10-CM | POA: Diagnosis not present

## 2021-11-29 MED ORDER — SODIUM CHLORIDE 0.9 % IV SOLN
200.0000 mg | Freq: Once | INTRAVENOUS | Status: AC
  Administered 2021-11-29: 200 mg via INTRAVENOUS
  Filled 2021-11-29: qty 200

## 2021-11-29 MED ORDER — SODIUM CHLORIDE 0.9 % IV SOLN
Freq: Once | INTRAVENOUS | Status: AC
  Filled 2021-11-29: qty 250

## 2021-11-29 NOTE — Telephone Encounter (Signed)
     Patient  visit on 11/1  at [facility]  Have you been able to follow up with your primary care physician? Yes   The patient was or was not able to obtain any needed medicine or equipment. Yes   Are there diet recommendations that you are having difficulty following? Na   Patient expresses understanding of discharge instructions and education provided has no other needs at this time. Yes    [address], [facility], Care Management  [phone] [address], [place], [place] [address] Phone: [phone] Email: [email or web address]

## 2021-12-03 MED FILL — Iron Sucrose Inj 20 MG/ML (Fe Equiv): INTRAVENOUS | Qty: 10 | Status: AC

## 2021-12-06 ENCOUNTER — Inpatient Hospital Stay: Payer: Medicare Other

## 2021-12-06 VITALS — BP 130/65 | HR 69 | Temp 98.1°F | Resp 16

## 2021-12-06 DIAGNOSIS — K649 Unspecified hemorrhoids: Secondary | ICD-10-CM | POA: Diagnosis not present

## 2021-12-06 DIAGNOSIS — D500 Iron deficiency anemia secondary to blood loss (chronic): Secondary | ICD-10-CM | POA: Diagnosis not present

## 2021-12-06 DIAGNOSIS — R7401 Elevation of levels of liver transaminase levels: Secondary | ICD-10-CM | POA: Diagnosis not present

## 2021-12-06 DIAGNOSIS — E039 Hypothyroidism, unspecified: Secondary | ICD-10-CM | POA: Diagnosis not present

## 2021-12-06 DIAGNOSIS — Z9884 Bariatric surgery status: Secondary | ICD-10-CM | POA: Diagnosis not present

## 2021-12-06 DIAGNOSIS — D62 Acute posthemorrhagic anemia: Secondary | ICD-10-CM

## 2021-12-06 DIAGNOSIS — E7800 Pure hypercholesterolemia, unspecified: Secondary | ICD-10-CM | POA: Diagnosis not present

## 2021-12-06 MED ORDER — SODIUM CHLORIDE 0.9 % IV SOLN
200.0000 mg | Freq: Once | INTRAVENOUS | Status: AC
  Administered 2021-12-06: 200 mg via INTRAVENOUS
  Filled 2021-12-06: qty 200

## 2021-12-06 MED ORDER — SODIUM CHLORIDE 0.9 % IV SOLN
Freq: Once | INTRAVENOUS | Status: AC
  Filled 2021-12-06: qty 250

## 2021-12-08 ENCOUNTER — Other Ambulatory Visit: Payer: Self-pay | Admitting: Family

## 2021-12-08 DIAGNOSIS — J309 Allergic rhinitis, unspecified: Secondary | ICD-10-CM

## 2021-12-13 ENCOUNTER — Other Ambulatory Visit: Payer: Self-pay

## 2021-12-13 ENCOUNTER — Ambulatory Visit (INDEPENDENT_AMBULATORY_CARE_PROVIDER_SITE_OTHER): Payer: Medicare Other | Admitting: Podiatry

## 2021-12-13 DIAGNOSIS — S90821A Blister (nonthermal), right foot, initial encounter: Secondary | ICD-10-CM | POA: Diagnosis not present

## 2021-12-13 MED ORDER — CEPHALEXIN 500 MG PO CAPS: 500.0000 mg | ORAL_CAPSULE | Freq: Three times a day (TID) | ORAL | 0 refills | Status: DC

## 2021-12-13 NOTE — Progress Notes (Signed)
  Subjective:  Patient ID: Jennifer [name], female    DOB: 1950-11-26,  MRN: [id number]  Chief Complaint  Patient presents with   Blister    right heel has blister/ pt puntured it with needle and having drainage/ok per [name]    71 y.o. female presents with the above complaint. History confirmed with patient.  This is a similar issue that is happened a couple times  Objective:  Physical Exam: warm, good capillary refill, no trophic changes or ulcerative lesions, normal DP and PT pulses, and large serous blister plantar medial heel no signs of infection draining serous drainage no purulence or cellulitis.  Assessment:   1. Blister of right heel, initial encounter      Plan:  Patient was evaluated and treated and all questions answered.  We discussed shoe gear that may be causing these recurrent blisters and offloading.  Currently it is draining well and there are no signs of infection.  I did put her on Keflex as a precaution.  Advised to dress daily with Betadine and gauze bandage with compression dressing.  This was wrapped in this manner today.  I will see her back in 1 month  Return in 1 month (on 01/12/2022) for wound check.

## 2021-12-14 ENCOUNTER — Encounter: Payer: Self-pay | Admitting: Gastroenterology

## 2021-12-14 ENCOUNTER — Ambulatory Visit (INDEPENDENT_AMBULATORY_CARE_PROVIDER_SITE_OTHER): Payer: Medicare Other | Admitting: Gastroenterology

## 2021-12-14 VITALS — BP 134/78 | HR 71 | Temp 98.7°F | Wt 186.2 lb

## 2021-12-14 DIAGNOSIS — L290 Pruritus ani: Secondary | ICD-10-CM

## 2021-12-14 DIAGNOSIS — K602 Anal fissure, unspecified: Secondary | ICD-10-CM

## 2021-12-14 NOTE — Progress Notes (Signed)
Jonathon Bellows MD, MRCP(U.K) [address]  [place]  [place], [place] [address]  Main: [phone]  Fax: [phone]   Primary Care Physician: [name], FNP  Primary Gastroenterologist:  Dr. Jonathon Bellows   Chief Complaint  Patient presents with   Anal Fissure    HPI: Jennifer [name] is a 71 y.o. female Lawerance Cruel of history :   History of rectal bleeding admitted to the hospital in January 2023, colonoscopy in 2021 showed hemorrhoids polyps were resected.  History of gastric sleeve surgery and duodenal switch.  EGD in November 2022 showed postsurgical changes but no strictures.  She has undergone banding of hemorrhoids first round in January 2020 3-second round in February 2023 in June 2023 saw Dr. Marius Ditch after hospitalization in May 2023 with severe acute blood loss anemia.  At her last visit with Dr. Marius Ditch in June 2023 she was having rectal bleeding with every bowel movement.  Video capsule study in May 2023 shows no active bleeding.  She had her last column of hemorrhoids banded Urgent visit 09/07/2021 for discomfort in the perianal area , also felt  that the food is stuck in the upper part of her throat    09/16/2021: Barium swallow with tablet: Mild GERD, moderate esophageal dysmotility     Interval history  09/23/2021-12/14/2021  The last visit she is doing well overall some itching in the perianal area at times feels abnormal sensation in the perianal area Current Outpatient Medications  Medication Sig Dispense Refill   acetaminophen (TYLENOL) 325 MG tablet Take 2 tablets (650 mg total) by mouth every 6 (six) hours as needed for mild pain (or Fever >/= 101).     acyclovir (ZOVIRAX) 400 MG tablet TAKE 1 TABLET BY MOUTH EVERY DAY 30 tablet 5   amitriptyline (ELAVIL) 25 MG tablet TAKE 1 TABLET BY MOUTH EVERY NIGHT FOR 1 WEEK THEN INCREASE TO 2 TABLETS EVERY NIGHT (Patient not taking: Reported on 11/22/2021)     amLODipine (NORVASC) 5 MG tablet Take 1 tablet by mouth  daily. (Patient not taking: Reported on 11/22/2021)     calcium citrate (CALCITRATE - DOSED IN MG ELEMENTAL CALCIUM) 950 (200 Ca) MG tablet Take 200 mg of elemental calcium by mouth daily.     cephALEXin (KEFLEX) 500 MG capsule Take 1 capsule (500 mg total) by mouth 3 (three) times daily. 21 capsule 0   diazepam (VALIUM) 5 MG tablet Take 5 mg by mouth every 6 (six) hours as needed.     famotidine (PEPCID) 20 MG tablet Take 20 mg by mouth daily.     fexofenadine (ALLEGRA ALLERGY) 180 MG tablet Take 1 tablet (180 mg total) by mouth daily. 90 tablet 1   fluticasone (FLONASE) 50 MCG/ACT nasal spray Place 2 sprays into both nostrils daily. 16 g 0   gabapentin (NEURONTIN) 300 MG capsule Take '600mg'$  qam, '600mg'$  every afternoon and '300mg'$  qhs. 180 capsule 3   hydrocortisone (ANUCORT-HC) 25 MG suppository UNWRAP AND INSERT 1 SUPPOSITORY RECTALLY TWICE A DAY 12 suppository 2   levothyroxine (SYNTHROID) 50 MCG tablet TAKE 1 TABLET BY MOUTH EVERY DAY 30 tablet 5   lidocaine (XYLOCAINE) 5 % ointment APPLY TOPICALLY 1 APPLICATION AS NEEDED (Patient not taking: Reported on 11/22/2021) 35.44 g 0   losartan (COZAAR) 100 MG tablet TAKE 1 TABLET BY MOUTH EVERYDAY AT BEDTIME 90 tablet 1   Multiple Vitamin (MULTIVITAMIN WITH MINERALS) TABS tablet Take 1 tablet by mouth daily.     oxyCODONE (OXY IR/ROXICODONE) 5  MG immediate release tablet Take 5 mg by mouth every 6 (six) hours as needed.     pantoprazole (PROTONIX) 40 MG tablet Take 1 tablet (40 mg total) by mouth 2 (two) times daily. 180 tablet 3   sodium chloride (OCEAN) 0.65 % SOLN nasal spray Place 2 sprays into both nostrils as needed for congestion. 30 mL 2   traMADol (ULTRAM) 50 MG tablet Take 1 tablet (50 mg total) by mouth every 6 (six) hours as needed. (Patient not taking: Reported on 11/22/2021) 8 tablet 0   traZODone (DESYREL) 50 MG tablet TAKE 1/2 TO 1 TABLET BY MOUTH AT BEDTIME AS NEEDED FOR SLEEP. 90 tablet 3   triamterene-hydrochlorothiazide (DYAZIDE) 37.5-25  MG capsule TAKE 1 EACH (1 CAPSULE TOTAL) BY MOUTH DAILY. 30 capsule 5   No current facility-administered medications for this visit.    Allergies as of 12/14/2021 - Review Complete 11/22/2021  Allergen Reaction Noted   Bee pollen Itching 11/17/2021   Celecoxib Hives 07/25/2016   Hydrocodone Nausea Only 06/02/2020   Pollen extract Itching    Sodium ferric gluconate [ferrous gluconate]  11/20/2020   Nasacort [triamcinolone] Other (See Comments) 12/07/2015   Vicodin [hydrocodone-acetaminophen] Nausea Only 09/03/2014    ROS:  General: Negative for anorexia, weight loss, fever, chills, fatigue, weakness. ENT: Negative for hoarseness, difficulty swallowing , nasal congestion. CV: Negative for chest pain, angina, palpitations, dyspnea on exertion, peripheral edema.  Respiratory: Negative for dyspnea at rest, dyspnea on exertion, cough, sputum, wheezing.  GI: See history of present illness. GU:  Negative for dysuria, hematuria, urinary incontinence, urinary frequency, nocturnal urination.  Endo: Negative for unusual weight change.    Physical Examination:   BP 134/78   Wt 186 lb 3.2 oz (84.5 kg)   BMI 35.18 kg/m   General: Well-nourished, well-developed in no acute distress.  Eyes: No icterus. Conjunctivae pink. Rectal exam chaperone present in the room: External skin tags prolapsing hemorrhoid spontaneously goes back in.  No abnormality seen otherwise. Psych: Alert and cooperative, normal mood and affect.   Imaging Studies: US Venous Img Lower Bilateral  Result Date: 11/17/2021 CLINICAL DATA:  Elevated D-dimer EXAM: BILATERAL LOWER EXTREMITY VENOUS DOPPLER ULTRASOUND TECHNIQUE: Gray-scale sonography with compression, as well as color and duplex ultrasound, were performed to evaluate the deep venous system(s) from the level of the common femoral vein through the popliteal and proximal calf veins. COMPARISON:  None Available. FINDINGS: VENOUS Normal compressibility of the common  femoral, superficial femoral, and popliteal veins, as well as the visualized calf veins. Visualized portions of profunda femoral vein and great saphenous vein unremarkable. No filling defects to suggest DVT on grayscale or color Doppler imaging. Doppler waveforms show normal direction of venous flow, normal respiratory plasticity and response to augmentation. OTHER None. Limitations: none IMPRESSION: Negative. Electronically Signed   By: Ulyses Jarred M.D.   On: 11/17/2021 14:52   CT Angio Chest PE W and/or Wo Contrast  Result Date: 11/17/2021 CLINICAL DATA:  Pulmonary embolus suspected EXAM: CT ANGIOGRAPHY CHEST WITH CONTRAST TECHNIQUE: Multidetector CT imaging of the chest was performed using the standard protocol during bolus administration of intravenous contrast. Multiplanar CT image reconstructions and MIPs were obtained to evaluate the vascular anatomy. RADIATION DOSE REDUCTION: This exam was performed according to the departmental dose-optimization program which includes automated exposure control, adjustment of the mA and/or kV according to patient size and/or use of iterative reconstruction technique. CONTRAST:  61m OMNIPAQUE IOHEXOL 350 MG/ML SOLN COMPARISON:  None Available. FINDINGS: Cardiovascular: Normal heart size.  No pericardial effusion. Normal caliber thoracic aorta with mild atherosclerotic disease. Dilated main pulmonary artery, measuring up to 3.5 cm. Mild coronary artery calcifications. No evidence of pulmonary embolus. Mediastinum/Nodes: Mildly patulous esophagus. Thyroid is unremarkable. No pathologically enlarged lymph nodes seen in the chest. Lungs/Pleura: Central airways are patent. Bibasilar atelectasis. Mild centrilobular emphysema. No consolidation, pleural effusion or pneumothorax. Small solid pulmonary nodule of the right upper lobe measuring 3 mm on series 2, image 72. Upper Abdomen: Postsurgical changes of the stomach. No acute abnormality. Musculoskeletal: No chest wall  abnormality. No acute or significant osseous findings. Review of the MIP images confirms the above findings. IMPRESSION: 1. No evidence of pulmonary embolus or acute airspace opacity. 2. Dilated main pulmonary artery, findings can be seen in the setting of pulmonary hypertension. 3. Small solid pulmonary nodule of the right upper lobe measuring 3 mm. No follow-up needed if patient is low-risk.This recommendation follows the consensus statement: Guidelines for Management of Incidental Pulmonary Nodules Detected on CT Images: From the Fleischner Society 2017; Radiology 2017; 284:228-243. 4. Aortic Atherosclerosis (ICD10-I70.0) and Emphysema (ICD10-J43.9). Electronically Signed   By: Yetta Glassman M.D.   On: 11/17/2021 14:10   DG Chest 2 View  Result Date: 11/17/2021 CLINICAL DATA:  Shortness of breath.  Chest pain. EXAM: CHEST - 2 VIEW COMPARISON:  PA chest and right rib radiographs 09/27/2021 FINDINGS: Cardiac silhouette and mediastinal contours are within normal limits. Mild calcification within the aortic arch. The lungs are clear. No pleural effusion or pneumothorax. Moderate multilevel disc space narrowing and anterior endplate osteophytes of the midthoracic spine. Cholecystectomy clips. IMPRESSION: No acute cardiopulmonary disease process. Electronically Signed   By: Yvonne Kendall M.D.   On: 11/17/2021 12:31    Assessment and Plan:   Jennifer [name] is a 71 y.o. y/o female  here to follow-up for internal hemorrhoids and prior history of anal fissure.  Presently she is doing reasonably well some discomfort in the perianal itching likely related to skin tags and retention of fecal material causing the discomfort we talked about perianal hygiene including the use of a bidet to wash and gently pat dry and use of a barrier ointment such as Desitin.  No hemorrhoidal banding needed at this point    Dr Jonathon Bellows  MD,MRCP [facility]) Follow up in as needed

## 2021-12-14 NOTE — Patient Instructions (Addendum)
Please gently wipe your rectum gently. Then you can use a bidet to wash the area and then apply Desitin.  Please take Miralax 17 Grams daily.  Please take a stool softener 1 capsule daily.  Zinc Oxide Cream, Lotion, Paste, or Ointment What is this medication? ZINC OXIDE (zingk OX ide) prevents and treats skin irritations, such as diaper rash. It works by forming a barrier on the skin, which protects it and allows it to heal. This medicine may be used for other purposes; ask your health care provider or pharmacist if you have questions. COMMON BRAND NAME(S): Aquaphor, Aquaphor 3 IN 1 Diaper Rash, Aquaphor Baby Fast Relief Diaper Rash, Aquaphor Fast Relief Diaper Rash, Balmex, [place]aux Butt Paste, Carlesta, Coppertone, COZIMA, DermacinRx Zinctral, Desitin, Desitin Maximum Strength, Desitin Rapid Relief, Diaper Rash, Dr. Thompson Caul, DynaShield, [place] Buttocks, Medi-Paste, Novana Protect, PanOxyl AM, Triple Paste, Triple Paste Adult Incontinence, Z-Bum What should I tell my care team before I take this medication? They need to know if you have any of these conditions: An unusual or allergic reaction to zinc oxide, other medications, foods, dyes, or preservatives Pregnant or trying to get pregnant Breast-feeding How should I use this medication? This medication is for external use only. Do not take by mouth. Follow the directions on the prescription or product label. Wash your hands before and after use. Apply a generous amount to the affected area. Do not cover with a bandage or dressing unless your care team tells you to. Do not get this medication in your eyes. If you do, rinse out with plenty of cool tap water. Talk to your care team about the use of this medication in children. While this medication may be prescribed for selected conditions, precautions do apply. Overdosage: If you think you have taken too much of this medicine contact a poison control center or emergency room at once. NOTE:  This medicine is only for you. Do not share this medicine with others. What if I miss a dose? If you miss a dose, use it as soon as you can. If it is almost time for your next dose, use only that dose. Do not use double or extra doses. What may interact with this medication? Interactions are not expected. Do not use other skin products at the same site without asking your care team. This list may not describe all possible interactions. Give your health care provider a list of all the medicines, herbs, non-prescription drugs, or dietary supplements you use. Also tell them if you smoke, drink alcohol, or use illegal drugs. Some items may interact with your medicine. What should I watch for while using this medication? Tell your care team if the area you are treating does not get better within a week. What side effects may I notice from receiving this medication? Side effects that you should report to your care team as soon as possible: Allergic reactions--skin rash, itching, hives, swelling of the face, lips, tongue, or throat This list may not describe all possible side effects. Call your doctor for medical advice about side effects. You may report side effects to FDA at 1-800-FDA-1088. Where should I keep my medication? Keep out of reach of children and pets. Store at room temperature. Keep closed while not in use. Throw away an unused medication after the expiration date. NOTE: This sheet is a summary. It may not cover all possible information. If you have questions about this medicine, talk to your doctor, pharmacist, or health care provider.  2023 Elsevier/Gold  Standard (2007-02-24 00:00:00)

## 2021-12-15 ENCOUNTER — Ambulatory Visit: Payer: Medicare Other | Admitting: Family

## 2021-12-17 DIAGNOSIS — M5416 Radiculopathy, lumbar region: Secondary | ICD-10-CM | POA: Diagnosis not present

## 2021-12-17 DIAGNOSIS — M48062 Spinal stenosis, lumbar region with neurogenic claudication: Secondary | ICD-10-CM | POA: Diagnosis not present

## 2021-12-20 ENCOUNTER — Ambulatory Visit (INDEPENDENT_AMBULATORY_CARE_PROVIDER_SITE_OTHER): Payer: Medicare Other | Admitting: Family

## 2021-12-20 ENCOUNTER — Encounter: Payer: Self-pay | Admitting: Family

## 2021-12-20 VITALS — BP 132/80 | HR 73 | Temp 98.1°F | Wt 183.8 lb

## 2021-12-20 DIAGNOSIS — K760 Fatty (change of) liver, not elsewhere classified: Secondary | ICD-10-CM | POA: Diagnosis not present

## 2021-12-20 DIAGNOSIS — Z9884 Bariatric surgery status: Secondary | ICD-10-CM

## 2021-12-20 DIAGNOSIS — G629 Polyneuropathy, unspecified: Secondary | ICD-10-CM

## 2021-12-20 DIAGNOSIS — I10 Essential (primary) hypertension: Secondary | ICD-10-CM | POA: Diagnosis not present

## 2021-12-20 MED ORDER — GABAPENTIN 600 MG PO TABS: 600.0000 mg | ORAL_TABLET | Freq: Three times a day (TID) | ORAL | 3 refills | Status: DC

## 2021-12-20 NOTE — Assessment & Plan Note (Signed)
Reassuring microfilament exam today.  Advised to increase gabapentin to 600 mg 3 times daily.  If no improvement, may consider Lyrica. Of note, she has a history of iron deficiency anemia s/p bariatric surgery.  She follows with Dr. Tasia Catchings.

## 2021-12-20 NOTE — Progress Notes (Signed)
Subjective:    Patient ID: Jennifer [name], female    DOB: Oct 25, 1950, 71 y.o.   MRN: [id number]  CC: [name] is a 71 y.o. female who presents today for follow up.   HPI: Neuropathy- complains of feet pain and numbness , particularly at night is bothersome. She is compliant with gabapentin 6o0 mg in the morning, 600 mg at noon and 300 mg at night.  She does not find gabapentin to be particular sedating.      She stopped cymbalta as didn't find effective.   12/17/21 Dr Sharlet Salina sp ESI right side with improvement of right side pain.   HTN-compliant with losartan 100 mg daily, triamterene hydrochlorothiazide 37.5-25 mg daily   follow up with Dr Clayborn Bigness 11/29/21, medications continued.    HISTORY:  Past Medical History:  Diagnosis Date   Anemia    Anxiety    Bariatric surgery status    Complication of anesthesia    Diificulty breathing for about 15 minutes after bariatric surgery   Constipation    COVID-19    Dysrhythmia    Elevated liver enzymes    GERD (gastroesophageal reflux disease)    Hemorrhoids    Herpes genitalis    High cholesterol    Hyperlipidemia    Hypertension    Hypothyroidism    IDA (iron deficiency anemia) 02/09/2021   Neuropathy    Osteoarthritis    Sleep apnea    CPAP   Past Surgical History:  Procedure Laterality Date   ABDOMINAL HYSTERECTOMY     total for fibroids no h/o abnormal pap   bariatric sleeve  2015   BREAST EXCISIONAL BIOPSY Left 1998   carpal tunnel repair     CATARACT EXTRACTION W/PHACO Right 11/01/2021   Procedure: CATARACT EXTRACTION PHACO AND INTRAOCULAR LENS PLACEMENT ([place]) RIGHT;  Surgeon: Eulogio Bear, MD;  Location: [place];  Service: Ophthalmology;  Laterality: Right;  sleep apnea 4.95 00:49.7   CATARACT EXTRACTION W/PHACO Left 11/15/2021   Procedure: CATARACT EXTRACTION PHACO AND INTRAOCULAR LENS PLACEMENT ([facility]) LEFT 4.94 00:32.3;  Surgeon: Eulogio Bear, MD;  Location: [place];  Service: Ophthalmology;  Laterality: Left;  sleep apnea   COLONOSCOPY WITH PROPOFOL N/A 02/11/2016   Procedure: COLONOSCOPY WITH PROPOFOL;  Surgeon: Jonathon Bellows, MD;  Location: [facility] ENDOSCOPY;  Service: Endoscopy;  Laterality: N/A;   COLONOSCOPY WITH PROPOFOL N/A 10/01/2019   Procedure: COLONOSCOPY WITH PROPOFOL;  Surgeon: Jonathon Bellows, MD;  Location: [facility] ENDOSCOPY;  Service: Gastroenterology;  Laterality: N/A;   COLONOSCOPY WITH PROPOFOL N/A 11/19/2020   Procedure: COLONOSCOPY WITH PROPOFOL;  Surgeon: Jonathon Bellows, MD;  Location: [facility] ENDOSCOPY;  Service: Gastroenterology;  Laterality: N/A;   ESOPHAGOGASTRODUODENOSCOPY (EGD) WITH PROPOFOL N/A 12/02/2019   Procedure: ESOPHAGOGASTRODUODENOSCOPY (EGD) WITH PROPOFOL;  Surgeon: Jonathon Bellows, MD;  Location: [facility] ENDOSCOPY;  Service: Gastroenterology;  Laterality: N/A;   ESOPHAGOGASTRODUODENOSCOPY (EGD) WITH PROPOFOL N/A 11/19/2020   Procedure: ESOPHAGOGASTRODUODENOSCOPY (EGD) WITH PROPOFOL;  Surgeon: Jonathon Bellows, MD;  Location: [facility] ENDOSCOPY;  Service: Gastroenterology;  Laterality: N/A;   FLEXIBLE SIGMOIDOSCOPY N/A 02/06/2021   Procedure: FLEXIBLE SIGMOIDOSCOPY;  Surgeon: [name], DO;  Location: [facility] ENDOSCOPY;  Service: Gastroenterology;  Laterality: N/A;   GIVENS CAPSULE STUDY N/A 06/07/2021   Procedure: GIVENS CAPSULE STUDY;  Surgeon: Lin Landsman, MD;  Location: [facility] ENDOSCOPY;  Service: Gastroenterology;  Laterality: N/A;   GIVENS CAPSULE STUDY N/A 06/09/2021   Procedure: GIVENS CAPSULE STUDY;  Surgeon: Lin Landsman, MD;  Location: [facility] ENDOSCOPY;  Service: Gastroenterology;  Laterality: N/A;   HEMORRHOID SURGERY     LAPAROSCOPIC GASTRIC RESTRICTIVE DUODENAL PROCEDURE (DUODENAL SWITCH) Bilateral    2020   Family History  Problem Relation Age of Onset   Breast cancer Sister 73       materal 1/2 sister   Hypertension Sister    Hypertension Mother    Heart disease Father    Hypertension Brother      Allergies: Bee pollen, Celecoxib, Hydrocodone, Pollen extract, Sodium ferric gluconate [ferrous gluconate], Nasacort [triamcinolone], and Vicodin [hydrocodone-acetaminophen] Current Outpatient Medications on File Prior to Visit  Medication Sig Dispense Refill   acetaminophen (TYLENOL) 325 MG tablet Take 2 tablets (650 mg total) by mouth every 6 (six) hours as needed for mild pain (or Fever >/= 101).     acyclovir (ZOVIRAX) 400 MG tablet TAKE 1 TABLET BY MOUTH EVERY DAY 30 tablet 5   calcium citrate (CALCITRATE - DOSED IN MG ELEMENTAL CALCIUM) 950 (200 Ca) MG tablet Take 200 mg of elemental calcium by mouth daily.     fexofenadine (ALLEGRA) 180 MG tablet TAKE 1 TABLET BY MOUTH EVERY DAY 90 tablet 2   fluticasone (FLONASE) 50 MCG/ACT nasal spray Place 2 sprays into both nostrils daily. 16 g 0   hydrocortisone (ANUCORT-HC) 25 MG suppository UNWRAP AND INSERT 1 SUPPOSITORY RECTALLY TWICE A DAY 12 suppository 2   levothyroxine (SYNTHROID) 50 MCG tablet TAKE 1 TABLET BY MOUTH EVERY DAY 30 tablet 5   losartan (COZAAR) 100 MG tablet TAKE 1 TABLET BY MOUTH EVERYDAY AT BEDTIME 90 tablet 1   Multiple Vitamin (MULTIVITAMIN WITH MINERALS) TABS tablet Take 1 tablet by mouth daily.     pantoprazole (PROTONIX) 40 MG tablet Take 1 tablet (40 mg total) by mouth 2 (two) times daily. 180 tablet 3   sodium chloride (OCEAN) 0.65 % SOLN nasal spray Place 2 sprays into both nostrils as needed for congestion. 30 mL 2   traZODone (DESYREL) 50 MG tablet TAKE 1/2 TO 1 TABLET BY MOUTH AT BEDTIME AS NEEDED FOR SLEEP. 90 tablet 3   triamterene-hydrochlorothiazide (DYAZIDE) 37.5-25 MG capsule TAKE 1 EACH (1 CAPSULE TOTAL) BY MOUTH DAILY. 30 capsule 5   No current facility-administered medications on file prior to visit.    Social History   Tobacco Use   Smoking status: Former    Packs/day: 1.50    Years: 24.00    Total pack years: 36.00    Types: Cigarettes    Quit date: 1993    Years since quitting: 30.9    Smokeless tobacco: Never   Tobacco comments:    quit 1995.   Vaping Use   Vaping Use: Never used  Substance Use Topics   Alcohol use: Yes    Alcohol/week: 1.0 standard drink of alcohol    Types: 1 Glasses of wine per week    Comment: DAILY   Drug use: No    Review of Systems  Constitutional:  Negative for chills and fever.  Respiratory:  Negative for cough.   Cardiovascular:  Negative for chest pain and palpitations.  Gastrointestinal:  Negative for nausea and vomiting.  Neurological:  Positive for numbness.      Objective:    BP 132/80   Pulse 73   Temp 98.1 F (36.7 C) (Oral)   Wt 183 lb 12.8 oz (83.4 kg)   SpO2 96%   BMI 34.73 kg/m  BP Readings from Last 3 Encounters:  12/20/21 132/80  12/14/21 134/78  12/06/21 130/65   Wt Readings from Last 3  Encounters:  12/20/21 183 lb 12.8 oz (83.4 kg)  12/14/21 186 lb 3.2 oz (84.5 kg)  11/22/21 188 lb 9.6 oz (85.5 kg)    Physical Exam Vitals reviewed.  Constitutional:      Appearance: She is well-developed.  Eyes:     Conjunctiva/sclera: Conjunctivae normal.  Cardiovascular:     Rate and Rhythm: Normal rate and regular rhythm.     Pulses: Normal pulses.     Heart sounds: Normal heart sounds.  Pulmonary:     Effort: Pulmonary effort is normal.     Breath sounds: Normal breath sounds. No wheezing, rhonchi or rales.  Skin:    General: Skin is warm and dry.  Neurological:     Mental Status: She is alert.     Comments: Patient is able to appreciate microfilament bilateral feet, symmetrically.  Palpable pedal pulses.  Psychiatric:        Speech: Speech normal.        Behavior: Behavior normal.        Thought Content: Thought content normal.        Assessment & Plan:   Problem List Items Addressed This Visit       Cardiovascular and Mediastinum   Essential hypertension    Chronic, stable.  Continue losartan 100 mg daily, triamterene hydrochlorothiazide 37.5-25 mg daily         Digestive   Fatty liver    Relevant Orders   Hepatic function panel   Lipid panel     Nervous and Auditory   Neuropathy    Reassuring microfilament exam today.  Advised to increase gabapentin to 600 mg 3 times daily.  If no improvement, may consider Lyrica. Of note, she has a history of iron deficiency anemia s/p bariatric surgery.  She follows with Dr. Tasia Catchings.      Relevant Medications   gabapentin (NEURONTIN) 600 MG tablet     Other   History of bariatric surgery - Primary     I have discontinued Marda Nabers's gabapentin, lidocaine, cephALEXin, famotidine, diazepam, and oxyCODONE. I am also having her start on gabapentin. Additionally, I am having her maintain her acetaminophen, multivitamin with minerals, calcium citrate, sodium chloride, fluticasone, levothyroxine, acyclovir, triamterene-hydrochlorothiazide, hydrocortisone, pantoprazole, traZODone, losartan, and fexofenadine.   Meds ordered this encounter  Medications   gabapentin (NEURONTIN) 600 MG tablet    Sig: Take 1 tablet (600 mg total) by mouth 3 (three) times daily.    Dispense:  90 tablet    Refill:  3    Order Specific Question:   Supervising Provider    Answer:   Crecencio Mc [[id number]]    Return precautions given.   Risks, benefits, and alternatives of the medications and treatment plan prescribed today were discussed, and patient expressed understanding.   Education regarding symptom management and diagnosis given to patient on AVS.  Continue to follow with [name], FNP for routine health maintenance.   Jennifer [name] and I agreed with plan.   [name], FNP

## 2021-12-20 NOTE — Patient Instructions (Addendum)
Increase gabapentin '600mg'$  three times a day.   Let me know if feet pain improves.

## 2021-12-20 NOTE — Assessment & Plan Note (Signed)
Chronic, stable.  Continue losartan 100 mg daily, triamterene hydrochlorothiazide 37.5-25 mg daily

## 2021-12-29 ENCOUNTER — Other Ambulatory Visit: Payer: Self-pay | Admitting: Family

## 2021-12-29 ENCOUNTER — Other Ambulatory Visit (INDEPENDENT_AMBULATORY_CARE_PROVIDER_SITE_OTHER): Payer: Medicare Other

## 2021-12-29 DIAGNOSIS — K760 Fatty (change of) liver, not elsewhere classified: Secondary | ICD-10-CM | POA: Diagnosis not present

## 2021-12-29 DIAGNOSIS — R7401 Elevation of levels of liver transaminase levels: Secondary | ICD-10-CM

## 2021-12-29 LAB — HEPATIC FUNCTION PANEL
ALT: 40 U/L — ABNORMAL HIGH (ref 0–35)
AST: 138 U/L — ABNORMAL HIGH (ref 0–37)
Albumin: 3.8 g/dL (ref 3.5–5.2)
Alkaline Phosphatase: 102 U/L (ref 39–117)
Bilirubin, Direct: 0.3 mg/dL (ref 0.0–0.3)
Total Bilirubin: 0.6 mg/dL (ref 0.2–1.2)
Total Protein: 6.2 g/dL (ref 6.0–8.3)

## 2021-12-29 LAB — LIPID PANEL
Cholesterol: 136 mg/dL (ref 0–200)
HDL: 46.2 mg/dL
LDL Cholesterol: 74 mg/dL (ref 0–99)
NonHDL: 89.8
Total CHOL/HDL Ratio: 3
Triglycerides: 79 mg/dL (ref 0.0–149.0)
VLDL: 15.8 mg/dL (ref 0.0–40.0)

## 2021-12-29 NOTE — Telephone Encounter (Signed)
Error

## 2021-12-30 ENCOUNTER — Telehealth: Payer: Self-pay | Admitting: *Deleted

## 2021-12-30 DIAGNOSIS — G5601 Carpal tunnel syndrome, right upper limb: Secondary | ICD-10-CM | POA: Diagnosis not present

## 2021-12-30 NOTE — Telephone Encounter (Signed)
Left voicemail for pt to call back & schedule a non-fasting lab appt in 2-3 weeks (per read [employer])

## 2021-12-31 ENCOUNTER — Inpatient Hospital Stay: Payer: Medicare Other | Attending: Oncology

## 2021-12-31 DIAGNOSIS — Z9884 Bariatric surgery status: Secondary | ICD-10-CM | POA: Insufficient documentation

## 2021-12-31 DIAGNOSIS — Z803 Family history of malignant neoplasm of breast: Secondary | ICD-10-CM | POA: Diagnosis not present

## 2021-12-31 DIAGNOSIS — R7401 Elevation of levels of liver transaminase levels: Secondary | ICD-10-CM | POA: Insufficient documentation

## 2021-12-31 DIAGNOSIS — K649 Unspecified hemorrhoids: Secondary | ICD-10-CM | POA: Diagnosis not present

## 2021-12-31 DIAGNOSIS — D500 Iron deficiency anemia secondary to blood loss (chronic): Secondary | ICD-10-CM | POA: Insufficient documentation

## 2021-12-31 DIAGNOSIS — Z79899 Other long term (current) drug therapy: Secondary | ICD-10-CM | POA: Diagnosis not present

## 2021-12-31 DIAGNOSIS — Z87891 Personal history of nicotine dependence: Secondary | ICD-10-CM | POA: Diagnosis not present

## 2021-12-31 DIAGNOSIS — Z79624 Long term (current) use of inhibitors of nucleotide synthesis: Secondary | ICD-10-CM | POA: Diagnosis not present

## 2021-12-31 LAB — CBC WITH DIFFERENTIAL/PLATELET
Abs Immature Granulocytes: 0.01 10*3/uL (ref 0.00–0.07)
Basophils Absolute: 0 10*3/uL (ref 0.0–0.1)
Basophils Relative: 1 %
Eosinophils Absolute: 0.1 10*3/uL (ref 0.0–0.5)
Eosinophils Relative: 1 %
HCT: 34.3 % — ABNORMAL LOW (ref 36.0–46.0)
Hemoglobin: 10.9 g/dL — ABNORMAL LOW (ref 12.0–15.0)
Immature Granulocytes: 0 %
Lymphocytes Relative: 34 %
Lymphs Abs: 1.6 10*3/uL (ref 0.7–4.0)
MCH: 28.3 pg (ref 26.0–34.0)
MCHC: 31.8 g/dL (ref 30.0–36.0)
MCV: 89.1 fL (ref 80.0–100.0)
Monocytes Absolute: 0.7 10*3/uL (ref 0.1–1.0)
Monocytes Relative: 15 %
Neutro Abs: 2.3 10*3/uL (ref 1.7–7.7)
Neutrophils Relative %: 49 %
Platelets: 203 10*3/uL (ref 150–400)
RBC: 3.85 MIL/uL — ABNORMAL LOW (ref 3.87–5.11)
RDW: 22.5 % — ABNORMAL HIGH (ref 11.5–15.5)
WBC: 4.7 10*3/uL (ref 4.0–10.5)
nRBC: 0 % (ref 0.0–0.2)

## 2021-12-31 LAB — COMPREHENSIVE METABOLIC PANEL
ALT: 33 U/L (ref 0–44)
AST: 90 U/L — ABNORMAL HIGH (ref 15–41)
Albumin: 3.6 g/dL (ref 3.5–5.0)
Alkaline Phosphatase: 93 U/L (ref 38–126)
Anion gap: 9 (ref 5–15)
BUN: 7 mg/dL — ABNORMAL LOW (ref 8–23)
CO2: 25 mmol/L (ref 22–32)
Calcium: 8.6 mg/dL — ABNORMAL LOW (ref 8.9–10.3)
Chloride: 102 mmol/L (ref 98–111)
Creatinine, Ser: 0.63 mg/dL (ref 0.44–1.00)
GFR, Estimated: >60 mL/min (ref >60)
Glucose, Bld: 105 mg/dL — ABNORMAL HIGH (ref 70–99)
Potassium: 3.3 mmol/L — ABNORMAL LOW (ref 3.5–5.1)
Sodium: 136 mmol/L (ref 135–145)
Total Bilirubin: 0.8 mg/dL (ref 0.3–1.2)
Total Protein: 7 g/dL (ref 6.5–8.1)

## 2021-12-31 LAB — FERRITIN: Ferritin: 269 ng/mL (ref 11–307)

## 2021-12-31 LAB — IRON AND TIBC
Iron: 56 ug/dL (ref 28–170)
Saturation Ratios: 19 % (ref 10.4–31.8)
TIBC: 302 ug/dL (ref 250–450)
UIBC: 246 ug/dL

## 2021-12-31 LAB — RETIC PANEL
Immature Retic Fract: 9.8 % (ref 2.3–15.9)
RBC.: 3.82 MIL/uL — ABNORMAL LOW (ref 3.87–5.11)
Retic Count, Absolute: 81.7 10*3/uL (ref 19.0–186.0)
Retic Ct Pct: 2.1 % (ref 0.4–3.1)
Reticulocyte Hemoglobin: 28.7 pg (ref >27.9)

## 2022-01-04 ENCOUNTER — Inpatient Hospital Stay (HOSPITAL_BASED_OUTPATIENT_CLINIC_OR_DEPARTMENT_OTHER): Payer: Medicare Other | Admitting: Oncology

## 2022-01-04 ENCOUNTER — Other Ambulatory Visit: Payer: Self-pay | Admitting: Family

## 2022-01-04 ENCOUNTER — Inpatient Hospital Stay: Payer: Medicare Other

## 2022-01-04 ENCOUNTER — Encounter: Payer: Self-pay | Admitting: Oncology

## 2022-01-04 ENCOUNTER — Telehealth: Payer: Self-pay | Admitting: Family

## 2022-01-04 VITALS — BP 122/72 | HR 57 | Temp 97.6°F | Wt 185.4 lb

## 2022-01-04 DIAGNOSIS — D500 Iron deficiency anemia secondary to blood loss (chronic): Secondary | ICD-10-CM

## 2022-01-04 DIAGNOSIS — F32A Depression, unspecified: Secondary | ICD-10-CM

## 2022-01-04 DIAGNOSIS — R7401 Elevation of levels of liver transaminase levels: Secondary | ICD-10-CM | POA: Diagnosis not present

## 2022-01-04 DIAGNOSIS — Z87891 Personal history of nicotine dependence: Secondary | ICD-10-CM | POA: Diagnosis not present

## 2022-01-04 DIAGNOSIS — D62 Acute posthemorrhagic anemia: Secondary | ICD-10-CM

## 2022-01-04 DIAGNOSIS — Z9884 Bariatric surgery status: Secondary | ICD-10-CM

## 2022-01-04 DIAGNOSIS — Z79624 Long term (current) use of inhibitors of nucleotide synthesis: Secondary | ICD-10-CM | POA: Diagnosis not present

## 2022-01-04 DIAGNOSIS — K649 Unspecified hemorrhoids: Secondary | ICD-10-CM | POA: Diagnosis not present

## 2022-01-04 DIAGNOSIS — E039 Hypothyroidism, unspecified: Secondary | ICD-10-CM

## 2022-01-04 MED ORDER — SODIUM CHLORIDE 0.9 % IV SOLN
200.0000 mg | Freq: Once | INTRAVENOUS | Status: AC
  Administered 2022-01-04: 200 mg via INTRAVENOUS
  Filled 2022-01-04: qty 200

## 2022-01-04 MED ORDER — SODIUM CHLORIDE 0.9 % IV SOLN
Freq: Once | INTRAVENOUS | Status: AC
  Filled 2022-01-04: qty 250

## 2022-01-04 NOTE — Telephone Encounter (Signed)
Pt would like to be called in regards to her eyes being watery and itchy. Pt would like some medication called in

## 2022-01-04 NOTE — Telephone Encounter (Signed)
Please call pt and ensure either make an appt within [facility], or virtual visit or lastly advise to go [facility] [address]

## 2022-01-04 NOTE — Telephone Encounter (Signed)
Spoke to pt and she stated that she had gotten in contact with eye dr and they will see her tomorrow 01/05/22.

## 2022-01-04 NOTE — Telephone Encounter (Signed)
Call patient   she has had recent cataracts removed from ophthalmology, Dr. Benay Pillow.    Please advise her that she will need to call his office.  I am not sure the reason for itchy watery eyes aside from perhaps allergies.    Please advise she call his office as he want her to come in the office for exam  if she is having pain or vision changes she needs to report immediately to the emergency room for in person evaluation

## 2022-01-05 ENCOUNTER — Encounter: Payer: Self-pay | Admitting: Oncology

## 2022-01-05 ENCOUNTER — Encounter: Payer: Self-pay | Admitting: *Deleted

## 2022-01-05 DIAGNOSIS — B301 Conjunctivitis due to adenovirus: Secondary | ICD-10-CM | POA: Diagnosis not present

## 2022-01-05 NOTE — Assessment & Plan Note (Signed)
Check iron panel, B12, folate periodically.

## 2022-01-05 NOTE — Progress Notes (Signed)
Hematology/Oncology Progress note [phone] [phone]         Patient Care Team: [name], FNP as PCP - General (Family Medicine) Kennith Center, RD as Dietitian (Family Medicine) Earlie Server, MD as Consulting Physician (Hematology and Oncology)  ASSESSMENT & PLAN:   IDA (iron deficiency anemia) #Iron deficiency anemia due to chronic blood loss. Labs reviewed and discussed with patient. Hemoglobin has improved to 10.9, MCV 78, TIBC 302, ferritin 269 history of gastric bypass Recommend patient to get additional IV Venofer treatment x1   Transaminitis Likely due to alcohol use    History of bariatric surgery Check iron panel, B12, folate periodically.    Orders Placed This Encounter  Procedures   CBC with Differential/Platelet    Standing Status:   Future    Standing Expiration Date:   01/05/2023   Iron and TIBC    Standing Status:   Future    Standing Expiration Date:   01/05/2023   Ferritin    Standing Status:   Future    Standing Expiration Date:   01/05/2023   Folate    Standing Status:   Future    Standing Expiration Date:   01/05/2023   Vitamin B12    Standing Status:   Future    Standing Expiration Date:   01/05/2023   Follow up in 6 months All questions were answered. The patient knows to call the clinic with any problems, questions or concerns.  Earlie Server, MD, PhD [facility] Hematology Oncology 01/04/2022   CHIEF COMPLAINTS/REASON FOR VISIT:  Follow-up for iron deficiency anemia.  HISTORY OF PRESENTING ILLNESS:   Jennifer [name] is a  71 y.o.  female with PMH listed below was seen in consultation at the request of  [name], FNP  for evaluation of iron deficiency anemia  Patient was admitted from 02/05/2021 - 02/06/2021 due to symptomatic anemia with bright red blood per rectum.  Initial hemoglobin at presentation was 5.9.  Patient was transfused with 2 units of PRBC.  Also received 1 dose of IV Venofer  treatments.  Patient had a flexible sigmoidoscopy by Dr. Virgina Jock.  Findings of nonbleeding hemorrhoids.  Patient follows up with Dr. [name] outpatient.  Patient was previously taking aspirin which has been discontinued.  Patient had hospitalization from 11/17/2020 - 11/19/2020 due to symptomatic anemia secondary to intermittent BPBPR/melena.  She received blood transfusion during that hospitalization.  She also had a reaction to ferrous gluconate. She had EGD and colonoscopy on 11/19/2020 with no notable findings of active bleeding.   Patient has a history of bariatric surgery.  INTERVAL HISTORY Jennifer [name] is a 71 y.o. female who has above history reviewed by me today presents for follow up visit for iron deficiency Patient is status post IV Venofer treatments.   Fatigue is slightly better.  She has no new complaints.     Review of Systems  Constitutional:  Positive for fatigue. Negative for appetite change, chills and fever.  HENT:   Negative for hearing loss and voice change.   Eyes:  Negative for eye problems.  Respiratory:  Negative for chest tightness and cough.   Cardiovascular:  Negative for chest pain.  Gastrointestinal:  Positive for blood in stool. Negative for abdominal distention and abdominal pain.  Endocrine: Negative for hot flashes.  Genitourinary:  Negative for difficulty urinating and frequency.   Musculoskeletal:  Negative for arthralgias.  Skin:  Negative for itching and rash.  Neurological:  Negative for extremity weakness.  Hematological:  Negative for adenopathy.  Psychiatric/Behavioral:  Negative for confusion.     MEDICAL HISTORY:  Past Medical History:  Diagnosis Date   Anemia    Anxiety    Bariatric surgery status    Complication of anesthesia    Diificulty breathing for about 15 minutes after bariatric surgery   Constipation    COVID-19    Dysrhythmia    Elevated liver enzymes    GERD (gastroesophageal reflux disease)    Hemorrhoids    Herpes  genitalis    High cholesterol    Hyperlipidemia    Hypertension    Hypothyroidism    IDA (iron deficiency anemia) 02/09/2021   Neuropathy    Osteoarthritis    Sleep apnea    CPAP    SURGICAL HISTORY: Past Surgical History:  Procedure Laterality Date   ABDOMINAL HYSTERECTOMY     total for fibroids no h/o abnormal pap   bariatric sleeve  2015   BREAST EXCISIONAL BIOPSY Left 1998   carpal tunnel repair     CATARACT EXTRACTION W/PHACO Right 11/01/2021   Procedure: CATARACT EXTRACTION PHACO AND INTRAOCULAR LENS PLACEMENT ([place]) RIGHT;  Surgeon: Eulogio Bear, MD;  Location: [place];  Service: Ophthalmology;  Laterality: Right;  sleep apnea 4.95 00:49.7   CATARACT EXTRACTION W/PHACO Left 11/15/2021   Procedure: CATARACT EXTRACTION PHACO AND INTRAOCULAR LENS PLACEMENT ([facility]) LEFT 4.94 00:32.3;  Surgeon: Eulogio Bear, MD;  Location: [place];  Service: Ophthalmology;  Laterality: Left;  sleep apnea   COLONOSCOPY WITH PROPOFOL N/A 02/11/2016   Procedure: COLONOSCOPY WITH PROPOFOL;  Surgeon: Jonathon Bellows, MD;  Location: [facility] ENDOSCOPY;  Service: Endoscopy;  Laterality: N/A;   COLONOSCOPY WITH PROPOFOL N/A 10/01/2019   Procedure: COLONOSCOPY WITH PROPOFOL;  Surgeon: Jonathon Bellows, MD;  Location[facility] ENDOSCOPY;  Service: Gastroenterology;  Laterality: N/A;   COLONOSCOPY WITH PROPOFOL N/A 11/19/2020   Procedure: COLONOSCOPY WITH PROPOFOL;  Surgeon: Jonathon Bellows, MD;  Location: [facility] ENDOSCOPY;  Service: Gastroenterology;  Laterality: N/A;   ESOPHAGOGASTRODUODENOSCOPY (EGD) WITH PROPOFOL N/A 12/02/2019   Procedure: ESOPHAGOGASTRODUODENOSCOPY (EGD) WITH PROPOFOL;  Surgeon: Jonathon Bellows, MD;  Location: [facility] ENDOSCOPY;  Service: Gastroenterology;  Laterality: N/A;   ESOPHAGOGASTRODUODENOSCOPY (EGD) WITH PROPOFOL N/A 11/19/2020   Procedure: ESOPHAGOGASTRODUODENOSCOPY (EGD) WITH PROPOFOL;  Surgeon: Jonathon Bellows, MD;  Location: [facility] ENDOSCOPY;  Service: Gastroenterology;   Laterality: N/A;   FLEXIBLE SIGMOIDOSCOPY N/A 02/06/2021   Procedure: FLEXIBLE SIGMOIDOSCOPY;  Surgeon: [name], DO;  Location: [facility] ENDOSCOPY;  Service: Gastroenterology;  Laterality: N/A;   GIVENS CAPSULE STUDY N/A 06/07/2021   Procedure: GIVENS CAPSULE STUDY;  Surgeon: Lin Landsman, MD;  Location: [facility] ENDOSCOPY;  Service: Gastroenterology;  Laterality: N/A;   GIVENS CAPSULE STUDY N/A 06/09/2021   Procedure: GIVENS CAPSULE STUDY;  Surgeon: Lin Landsman, MD;  Location: [facility] ENDOSCOPY;  Service: Gastroenterology;  Laterality: N/A;   HEMORRHOID SURGERY     LAPAROSCOPIC GASTRIC RESTRICTIVE DUODENAL PROCEDURE (DUODENAL SWITCH) Bilateral    2020    SOCIAL HISTORY: Social History   Socioeconomic History   Marital status: Married    Spouse name: Not on file   Number of children: Not on file   Years of education: Not on file   Highest education level: Not on file  Occupational History   Not on file  Tobacco Use   Smoking status: Former    Packs/day: 1.50    Years: 24.00    Total pack years: 36.00    Types: Cigarettes    Quit date:  1993    Years since quitting: 30.9   Smokeless tobacco: Never   Tobacco comments:    quit 1995.   Vaping Use   Vaping Use: Never used  Substance and Sexual Activity   Alcohol use: Yes    Alcohol/week: 1.0 standard drink of alcohol    Types: 1 Glasses of wine per week    Comment: DAILY   Drug use: No   Sexual activity: Not Currently    Birth control/protection: Surgical    Comment: Hysterectomy  Other Topics Concern   Not on file  Social History Narrative   Lives in [place].    Married.    Retired 2015, Interior and spatial designer.    One son; granddaughter.    Left handed    Caffeine- decaf coffee.    Social Determinants of Health   Financial Resource Strain: Low Risk  (09/28/2021)   Overall Financial Resource Strain (CARDIA)    Difficulty of Paying Living Expenses: Not hard at all  Food Insecurity: No Food  Insecurity (09/28/2021)   Hunger Vital Sign    Worried About Running Out of Food in the Last Year: Never true    Ran Out of Food in the Last Year: Never true  Transportation Needs: No Transportation Needs (09/28/2021)   PRAPARE - Hydrologist (Medical): No    Lack of Transportation (Non-Medical): No  Physical Activity: Insufficiently Active (09/06/2019)   Exercise Vital Sign    Days of Exercise per Week: 3 days    Minutes of Exercise per Session: 20 min  Stress: No Stress Concern Present (09/28/2021)   [place]    Feeling of Stress : Not at all  Social Connections: Unknown (09/28/2021)   Social Connection and Isolation Panel [NHANES]    Frequency of Communication with Friends and Family: More than three times a week    Frequency of Social Gatherings with Friends and Family: More than three times a week    Attends Religious Services: Not on file    Active Member of [place] or Organizations: Not on file    Attends Archivist Meetings: Not on file    Marital Status: Married  Intimate Partner Violence: Not At Risk (09/28/2021)   Humiliation, Afraid, Rape, and Kick questionnaire    Fear of Current or Ex-Partner: No    Emotionally Abused: No    Physically Abused: No    Sexually Abused: No    FAMILY HISTORY: Family History  Problem Relation Age of Onset   Breast cancer Sister 63       materal 1/2 sister   Hypertension Sister    Hypertension Mother    Heart disease Father    Hypertension Brother     ALLERGIES:  is allergic to bee pollen, celecoxib, hydrocodone, pollen extract, sodium ferric gluconate [ferrous gluconate], nasacort [triamcinolone], and vicodin [hydrocodone-acetaminophen].  MEDICATIONS:  Current Outpatient Medications  Medication Sig Dispense Refill   acetaminophen (TYLENOL) 325 MG tablet Take 2 tablets (650 mg total) by mouth every 6 (six) hours as needed for mild  pain (or Fever >/= 101).     acyclovir (ZOVIRAX) 400 MG tablet TAKE 1 TABLET BY MOUTH EVERY DAY 30 tablet 5   calcium citrate (CALCITRATE - DOSED IN MG ELEMENTAL CALCIUM) 950 (200 Ca) MG tablet Take 200 mg of elemental calcium by mouth daily.     fexofenadine (ALLEGRA) 180 MG tablet TAKE 1 TABLET BY MOUTH EVERY DAY 90  tablet 2   fluticasone (FLONASE) 50 MCG/ACT nasal spray Place 2 sprays into both nostrils daily. 16 g 0   gabapentin (NEURONTIN) 600 MG tablet Take 1 tablet (600 mg total) by mouth 3 (three) times daily. 90 tablet 3   hydrocortisone (ANUCORT-HC) 25 MG suppository UNWRAP AND INSERT 1 SUPPOSITORY RECTALLY TWICE A DAY 12 suppository 2   losartan (COZAAR) 100 MG tablet TAKE 1 TABLET BY MOUTH EVERYDAY AT BEDTIME 90 tablet 1   Multiple Vitamin (MULTIVITAMIN WITH MINERALS) TABS tablet Take 1 tablet by mouth daily.     pantoprazole (PROTONIX) 40 MG tablet Take 1 tablet (40 mg total) by mouth 2 (two) times daily. 180 tablet 3   sodium chloride (OCEAN) 0.65 % SOLN nasal spray Place 2 sprays into both nostrils as needed for congestion. 30 mL 2   triamterene-hydrochlorothiazide (DYAZIDE) 37.5-25 MG capsule TAKE 1 EACH (1 CAPSULE TOTAL) BY MOUTH DAILY. 30 capsule 5   levothyroxine (SYNTHROID) 50 MCG tablet Take 1 tablet (50 mcg total) by mouth daily. DX code E03.9 90 tablet 0   traZODone (DESYREL) 50 MG tablet for sleep DX Code G47.00 90 tablet 0   No current facility-administered medications for this visit.     PHYSICAL EXAMINATION: ECOG PERFORMANCE STATUS: 1 - Symptomatic but completely ambulatory Vitals:   01/04/22 1356  BP: 122/72  Pulse: (!) 57  Temp: 97.6 F (36.4 C)  SpO2: 100%   Filed Weights   01/04/22 1356  Weight: 185 lb 6.4 oz (84.1 kg)    Physical Exam Constitutional:      General: She is not in acute distress. HENT:     Head: Normocephalic and atraumatic.  Eyes:     General: No scleral icterus. Cardiovascular:     Rate and Rhythm: Normal rate and regular  rhythm.     Heart sounds: Normal heart sounds.  Pulmonary:     Effort: Pulmonary effort is normal. No respiratory distress.     Breath sounds: No wheezing.  Abdominal:     General: Bowel sounds are normal. There is no distension.     Palpations: Abdomen is soft.  Musculoskeletal:        General: No deformity. Normal range of motion.     Cervical back: Normal range of motion and neck supple.  Skin:    General: Skin is warm and dry.     Findings: No erythema or rash.  Neurological:     Mental Status: She is alert and oriented to person, place, and time. Mental status is at baseline.     Cranial Nerves: No cranial nerve deficit.     Coordination: Coordination normal.  Psychiatric:        Mood and Affect: Mood normal.     LABORATORY DATA:  I have reviewed the data as listed    Latest Ref Rng & Units 12/31/2021   12:37 PM 11/19/2021   10:30 AM 11/17/2021   12:08 PM  CBC  WBC 4.0 - 10.5 K/uL 4.7  5.3  5.9   Hemoglobin 12.0 - 15.0 g/dL 10.9  9.9  10.7   Hematocrit 36.0 - 46.0 % 34.3  31.4  33.5   Platelets 150 - 400 K/uL 203  178  190       Latest Ref Rng & Units 12/31/2021   12:37 PM 12/29/2021    8:52 AM 11/17/2021   12:08 PM  CMP  Glucose 70 - 99 mg/dL 105   110   BUN 8 - 23 mg/dL 7  6   Creatinine 0.44 - 1.00 mg/dL 0.63   0.68   Sodium 135 - 145 mmol/L 136   132   Potassium 3.5 - 5.1 mmol/L 3.3   4.1   Chloride 98 - 111 mmol/L 102   98   CO2 22 - 32 mmol/L 25   25   Calcium 8.9 - 10.3 mg/dL 8.6   8.8   Total Protein 6.5 - 8.1 g/dL 7.0  6.2    Total Bilirubin 0.3 - 1.2 mg/dL 0.8  0.6    Alkaline Phos 38 - 126 U/L 93  102    AST 15 - 41 U/L 90  138    ALT 0 - 44 U/L 33  40      Lab Results  Component Value Date   IRON 56 12/31/2021   TIBC 302 12/31/2021   FERRITIN 269 12/31/2021     RADIOGRAPHIC STUDIES: I have personally reviewed the radiological images as listed and agreed with the findings in the report. No results found.

## 2022-01-05 NOTE — Assessment & Plan Note (Addendum)
Likely due to alcohol use

## 2022-01-05 NOTE — Assessment & Plan Note (Signed)
#  Iron deficiency anemia due to chronic blood loss. Labs reviewed and discussed with patient. Hemoglobin has improved to 10.9, MCV 78, TIBC 302, ferritin 269 history of gastric bypass Recommend patient to get additional IV Venofer treatment x1

## 2022-01-11 DIAGNOSIS — J302 Other seasonal allergic rhinitis: Secondary | ICD-10-CM | POA: Diagnosis not present

## 2022-01-11 DIAGNOSIS — J0120 Acute ethmoidal sinusitis, unspecified: Secondary | ICD-10-CM | POA: Diagnosis not present

## 2022-01-12 ENCOUNTER — Ambulatory Visit (INDEPENDENT_AMBULATORY_CARE_PROVIDER_SITE_OTHER): Payer: Medicare Other | Admitting: Podiatry

## 2022-01-12 ENCOUNTER — Other Ambulatory Visit: Payer: Self-pay | Admitting: Family Medicine

## 2022-01-12 ENCOUNTER — Other Ambulatory Visit: Payer: Self-pay | Admitting: Family

## 2022-01-12 DIAGNOSIS — M4807 Spinal stenosis, lumbosacral region: Secondary | ICD-10-CM | POA: Diagnosis not present

## 2022-01-12 DIAGNOSIS — L97412 Non-pressure chronic ulcer of right heel and midfoot with fat layer exposed: Secondary | ICD-10-CM | POA: Diagnosis not present

## 2022-01-12 DIAGNOSIS — M5136 Other intervertebral disc degeneration, lumbar region: Secondary | ICD-10-CM | POA: Diagnosis not present

## 2022-01-12 DIAGNOSIS — M5416 Radiculopathy, lumbar region: Secondary | ICD-10-CM

## 2022-01-15 NOTE — Progress Notes (Signed)
  Subjective:  Patient ID: Jennifer [name], female    DOB: 1950-07-16,  MRN: [id number]  Chief Complaint  Patient presents with   Blister    right heel has blister/ pt puntured it with needle and having drainage/ok per [name]    71 y.o. female presents with the above complaint. History confirmed with patient.  She says it is doing okay  Objective:  Physical Exam: warm, good capillary refill, no trophic changes or ulcerative lesions, normal DP and PT pulses, and large serous blister plantar medial heel has now transition into a full-thickness ulcer with exposed subcutaneous tissue measures 22 x 22 x 3 mm, no signs of infection draining serous drainage no purulence or cellulitis.   Assessment:   1. Blister of right heel, initial encounter      Plan:  Patient was evaluated and treated and all questions answered.  Ulcer right heel -We discussed the etiology and factors that are a part of the wound healing process.  We also discussed the risk of infection both soft tissue and osteomyelitis from open ulceration.  Discussed the risk of limb loss if this happens or worsens. -Debridement as below. -Dressed with silver alginate, DSD. -Continue home dressing changes daily with 4 x 4 gauze and alginate and DSD.  Wound care supply order sent to [facility]  Procedure: Excisional Debridement of Wound Rationale: Removal of non-viable soft tissue from the wound to promote healing.  Anesthesia: none Post-Debridement Wound Measurements: Noted above Type of Debridement: Sharp Excisional Tissue Removed: Non-viable soft tissue Depth of Debridement: subcutaneous tissue. Technique: Sharp excisional debridement to bleeding, viable wound base.  Dressing: Dry, sterile, compression dressing. Disposition: Patient tolerated procedure well.    Return in about 26 days (around 02/07/2022) for wound care.       Return in 1 month (on 01/12/2022) for wound check.

## 2022-01-19 ENCOUNTER — Telehealth: Payer: Self-pay | Admitting: *Deleted

## 2022-01-19 NOTE — Telephone Encounter (Signed)
Patient was having an allergic reaction to a medication, nothing prescribed at our office,called wrong office.

## 2022-01-20 ENCOUNTER — Other Ambulatory Visit: Payer: Medicare Other

## 2022-01-24 ENCOUNTER — Encounter: Payer: Medicare Other | Admitting: Family

## 2022-01-24 ENCOUNTER — Telehealth: Payer: Self-pay

## 2022-01-24 NOTE — Telephone Encounter (Signed)
Called pt to get her scheduled for her tele health appt today.   Pt stated she did not even know this appt was made and stated she has a hard time with her new phone and even working it and wanted to know if it could just be a phone visit.   Pt was advised that insurance would not cover a phone visit pt then stated that she will try the mychart visit.   Once I got through my check offs of getting pt started pt then asked could she just call Ar[facility] back once she finished the medication she thinks is causing her problem. Pt trhe stsated I just think this appt is unnecessary and wanted to cancel.

## 2022-01-25 NOTE — Progress Notes (Signed)
This encounter was created in error - please disregard.

## 2022-01-28 ENCOUNTER — Telehealth: Payer: Self-pay | Admitting: Podiatry

## 2022-01-28 NOTE — Telephone Encounter (Signed)
Pt got medical supplies from [facility] and she is very confused on how to use the supplies she got. She states she left message 2 days ago for the nurse and no one has called her back. Please have someone call pt and explain how to use the supplies she got.

## 2022-02-01 DIAGNOSIS — J0120 Acute ethmoidal sinusitis, unspecified: Secondary | ICD-10-CM | POA: Diagnosis not present

## 2022-02-01 DIAGNOSIS — J302 Other seasonal allergic rhinitis: Secondary | ICD-10-CM | POA: Diagnosis not present

## 2022-02-01 NOTE — Telephone Encounter (Signed)
Patient is calling back about the supplies that she received from [facility]. She states that she spoke with [facility] and they are unable to assist her. She is needing to come into the office so that someone can show her how to properly use the supplies that she received.

## 2022-02-01 NOTE — Telephone Encounter (Signed)
Spoke to the patient and she stated that [place] wrapped her foot in the office and the supplies she that were shipped to her from [facility] is different. Patient would like to know if she can come in and someone can show her how to wrap her foot with the supplies given. Patient can be put on the nurse schedule for that.

## 2022-02-03 ENCOUNTER — Ambulatory Visit (INDEPENDENT_AMBULATORY_CARE_PROVIDER_SITE_OTHER): Payer: Medicare Other | Admitting: Podiatry

## 2022-02-03 ENCOUNTER — Other Ambulatory Visit: Payer: Medicare Other

## 2022-02-03 ENCOUNTER — Encounter: Payer: Self-pay | Admitting: Podiatry

## 2022-02-03 VITALS — BP 112/62 | HR 71

## 2022-02-03 DIAGNOSIS — L97412 Non-pressure chronic ulcer of right heel and midfoot with fat layer exposed: Secondary | ICD-10-CM

## 2022-02-03 NOTE — Progress Notes (Signed)
Patient presents with a box of wound care supplies from [facility].  She stated she does not know how to use it.  I instructed the patient that she can cleanse the wound with saline.  I stressed to her the importance of not rubbing the area.  I then advised her to apply the silver alginate dressing and then apply the 4x4 gauze dressing over it.  Then use the paper tape to adhere it.  Patient stated she understood the instructions.  She asked if she would have to contact [facility] to order more supplies.  I told her she did not, they will send more or contact us if they have any questions.

## 2022-02-07 ENCOUNTER — Other Ambulatory Visit: Payer: Medicare Other

## 2022-02-07 ENCOUNTER — Ambulatory Visit: Payer: Medicare Other | Admitting: Podiatry

## 2022-02-14 ENCOUNTER — Other Ambulatory Visit: Payer: Medicare Other

## 2022-02-16 ENCOUNTER — Ambulatory Visit: Payer: Medicare Other | Admitting: Podiatry

## 2022-02-17 ENCOUNTER — Encounter: Payer: Self-pay | Admitting: Podiatry

## 2022-02-17 ENCOUNTER — Ambulatory Visit (INDEPENDENT_AMBULATORY_CARE_PROVIDER_SITE_OTHER): Payer: Medicare Other | Admitting: Podiatry

## 2022-02-17 DIAGNOSIS — L139 Bullous disorder, unspecified: Secondary | ICD-10-CM | POA: Diagnosis not present

## 2022-02-17 DIAGNOSIS — S90822A Blister (nonthermal), left foot, initial encounter: Secondary | ICD-10-CM

## 2022-02-17 NOTE — Progress Notes (Signed)
Subjective:  Patient ID: Jennifer [name], female    DOB: 05-Aug-1950,  MRN: [id number]  Chief Complaint  Patient presents with   blisters    Left heel blister     72 y.o. female presents with the above complaint.  Patient presents with left heel blister ration.  Patient states that just came out of nowhere is progressive gotten worse.  She states maybe the new shoes could have rubbed it.  She wanted to have it drained.  She has not seen anyone as prior to seeing me.  She is a diabetic with unknown A1c.  She states is not painful but it is bothersome.   Review of Systems: Negative except as noted in the HPI. Denies N/V/F/Ch.  Past Medical History:  Diagnosis Date   Anemia    Anxiety    Bariatric surgery status    Complication of anesthesia    Diificulty breathing for about 15 minutes after bariatric surgery   Constipation    COVID-19    Dysrhythmia    Elevated liver enzymes    GERD (gastroesophageal reflux disease)    Hemorrhoids    Herpes genitalis    High cholesterol    Hyperlipidemia    Hypertension    Hypothyroidism    IDA (iron deficiency anemia) 02/09/2021   Neuropathy    Osteoarthritis    Sleep apnea    CPAP    Current Outpatient Medications:    acetaminophen (TYLENOL) 325 MG tablet, Take 2 tablets (650 mg total) by mouth every 6 (six) hours as needed for mild pain (or Fever >/= 101)., Disp: , Rfl:    acyclovir (ZOVIRAX) 400 MG tablet, TAKE 1 TABLET BY MOUTH EVERY DAY, Disp: 30 tablet, Rfl: 5   calcium citrate (CALCITRATE - DOSED IN MG ELEMENTAL CALCIUM) 950 (200 Ca) MG tablet, Take 200 mg of elemental calcium by mouth daily., Disp: , Rfl:    fexofenadine (ALLEGRA) 180 MG tablet, TAKE 1 TABLET BY MOUTH EVERY DAY, Disp: 90 tablet, Rfl: 2   fluticasone (FLONASE) 50 MCG/ACT nasal spray, SPRAY 1 SPRAY INTO BOTH NOSTRILS DAILY., Disp: 16 mL, Rfl: 2   gabapentin (NEURONTIN) 600 MG tablet, Take 1 tablet (600 mg total) by mouth 3 (three) times daily., Disp: 90 tablet,  Rfl: 3   hydrocortisone (ANUCORT-HC) 25 MG suppository, UNWRAP AND INSERT 1 SUPPOSITORY RECTALLY TWICE A DAY, Disp: 12 suppository, Rfl: 2   levothyroxine (SYNTHROID) 50 MCG tablet, Take 1 tablet (50 mcg total) by mouth daily. DX code E03.9, Disp: 90 tablet, Rfl: 0   losartan (COZAAR) 100 MG tablet, TAKE 1 TABLET BY MOUTH EVERYDAY AT BEDTIME, Disp: 90 tablet, Rfl: 1   Multiple Vitamin (MULTIVITAMIN WITH MINERALS) TABS tablet, Take 1 tablet by mouth daily., Disp: , Rfl:    pantoprazole (PROTONIX) 40 MG tablet, Take 1 tablet (40 mg total) by mouth 2 (two) times daily., Disp: 180 tablet, Rfl: 3   sodium chloride (OCEAN) 0.65 % SOLN nasal spray, Place 2 sprays into both nostrils as needed for congestion., Disp: 30 mL, Rfl: 2   traZODone (DESYREL) 50 MG tablet, for sleep DX Code G47.00, Disp: 90 tablet, Rfl: 0   triamterene-hydrochlorothiazide (DYAZIDE) 37.5-25 MG capsule, TAKE 1 EACH (1 CAPSULE TOTAL) BY MOUTH DAILY., Disp: 30 capsule, Rfl: 5  Social History   Tobacco Use  Smoking Status Former   Packs/day: 1.50   Years: 24.00   Total pack years: 36.00   Types: Cigarettes   Quit date: 1993   Years since quitting: 31.1  Smokeless Tobacco Never  Tobacco Comments   quit 1995.     Allergies  Allergen Reactions   Bee Pollen Itching   Celecoxib Hives   Hydrocodone Nausea Only   Pollen Extract Itching   Sodium Ferric Gluconate [Ferrous Gluconate]     IV ferric gluconate - shortly after the infusion developed back pain shooting into left arm and bilateral hand swelling   Nasacort [Triamcinolone] Other (See Comments)    Nasal - Nose Bleeds   Vicodin [Hydrocodone-Acetaminophen] Nausea Only   Objective:  There were no vitals filed for this visit. There is no height or weight on file to calculate BMI. Constitutional Well developed. Well nourished.  Vascular Dorsalis pedis pulses palpable bilaterally. Posterior tibial pulses palpable bilaterally. Capillary refill normal to all digits.  No  cyanosis or clubbing noted. Pedal hair growth normal.  Neurologic Normal speech. Oriented to person, place, and time. Epicritic sensation to light touch grossly present bilaterally.  Dermatologic Serous fluid-filled blister noted to the left heel.  No underlying breakdown of the skin noted pink granular skin noted after aspiration of the bulla/blister.  No signs of infection noted no purulent drainage noted.  Orthopedic: Normal joint ROM without pain or crepitus bilaterally. No visible deformities. No bony tenderness.   Radiographs: None Assessment:   1. Serous bulla of skin   2. Blister of left heel, initial encounter    Plan:  Patient was evaluated and treated and all questions answered.  Left heel blister/bulla -All questions and concerns were discussed with the patient extensive detail.  Using 18-gauge needle the blister was lanced and drained.  Serous fluid noted.  No purulent drainage noted no signs of infection noted underlying skin is pink and granular. -I encouraged her to do Betadine wet-to-dry dressing -I encouraged shoe gear modification as well in extensive detail she states understanding  No follow-ups on file.  Left heel blister serosanguineous drainage.  No signs of infection

## 2022-02-21 ENCOUNTER — Ambulatory Visit: Payer: Medicare Other | Admitting: Podiatry

## 2022-03-01 ENCOUNTER — Ambulatory Visit: Payer: Medicare Other | Admitting: Podiatry

## 2022-03-07 ENCOUNTER — Encounter: Payer: Self-pay | Admitting: Podiatry

## 2022-03-07 ENCOUNTER — Ambulatory Visit (INDEPENDENT_AMBULATORY_CARE_PROVIDER_SITE_OTHER): Payer: Medicare Other | Admitting: Podiatry

## 2022-03-07 VITALS — BP 131/69 | HR 69

## 2022-03-07 DIAGNOSIS — S90822A Blister (nonthermal), left foot, initial encounter: Secondary | ICD-10-CM

## 2022-03-07 DIAGNOSIS — L97412 Non-pressure chronic ulcer of right heel and midfoot with fat layer exposed: Secondary | ICD-10-CM | POA: Diagnosis not present

## 2022-03-07 NOTE — Patient Instructions (Addendum)
Call [phone] to schedule your wound care follow up

## 2022-03-07 NOTE — Progress Notes (Signed)
  Subjective:  Patient ID: Jennifer [name], female    DOB: 04/28/1950,  MRN: [id number]  Chief Complaint  Patient presents with   Foot Ulcer    "Slowly but surely, I have something on the side of my right foot that has started to break out or it's irritated."    72 y.o. female presents with the above complaint. History confirmed with patient.  Has a new spot on the right side of the foot now  Objective:  Physical Exam: warm, good capillary refill, no trophic changes or ulcerative lesions, strong palpable DP and nonpalpable PT pulses, and bilateral plantar medial heel ulcerations with exposed subcutaneous tissue, photograph noted below.  No signs of infection, fibrogranular wound bed.  Superficial blister on left lateral heel        Assessment:   1. Ulcer of heel, right, with fat layer exposed ([place])   2. Blister of left heel, initial encounter      Plan:  Patient was evaluated and treated and all questions answered.  Ulcer right heel -Recommended evaluation of noninvasive vascular testing, she has a strong palpable DP but a nonpalpable PT pulse.  Would like to evaluate if decreased PT is contributing to decreased wound healing -Referral to wound care center placed, this continues to wax and wane. -Continue dressing daily with Aquacel and gauze dressings -Currently no local signs of infection, continue to monitor   Return if symptoms worsen or fail to improve.

## 2022-03-09 ENCOUNTER — Other Ambulatory Visit: Payer: Self-pay

## 2022-03-09 ENCOUNTER — Ambulatory Visit
Admission: RE | Admit: 2022-03-09 | Discharge: 2022-03-09 | Disposition: A | Discharge status: Home / Self Care | Payer: Medicare Other | Source: Ambulatory Visit | Attending: Family Medicine | Admitting: Family Medicine

## 2022-03-09 DIAGNOSIS — M48061 Spinal stenosis, lumbar region without neurogenic claudication: Secondary | ICD-10-CM | POA: Diagnosis not present

## 2022-03-09 DIAGNOSIS — M79604 Pain in right leg: Secondary | ICD-10-CM | POA: Diagnosis not present

## 2022-03-09 DIAGNOSIS — M5416 Radiculopathy, lumbar region: Secondary | ICD-10-CM

## 2022-03-09 DIAGNOSIS — L97412 Non-pressure chronic ulcer of right heel and midfoot with fat layer exposed: Secondary | ICD-10-CM

## 2022-03-09 DIAGNOSIS — R200 Anesthesia of skin: Secondary | ICD-10-CM | POA: Diagnosis not present

## 2022-03-09 DIAGNOSIS — M545 Low back pain, unspecified: Secondary | ICD-10-CM | POA: Diagnosis not present

## 2022-03-14 ENCOUNTER — Telehealth: Payer: Self-pay | Admitting: Family

## 2022-03-14 ENCOUNTER — Other Ambulatory Visit: Payer: Self-pay | Admitting: Family

## 2022-03-14 ENCOUNTER — Other Ambulatory Visit (INDEPENDENT_AMBULATORY_CARE_PROVIDER_SITE_OTHER): Payer: Medicare Other

## 2022-03-14 DIAGNOSIS — R748 Abnormal levels of other serum enzymes: Secondary | ICD-10-CM

## 2022-03-14 DIAGNOSIS — R7401 Elevation of levels of liver transaminase levels: Secondary | ICD-10-CM | POA: Diagnosis not present

## 2022-03-14 LAB — HEPATIC FUNCTION PANEL
ALT: 53 U/L — ABNORMAL HIGH (ref 0–35)
AST: 166 U/L — ABNORMAL HIGH (ref 0–37)
Albumin: 3.7 g/dL (ref 3.5–5.2)
Alkaline Phosphatase: 159 U/L — ABNORMAL HIGH (ref 39–117)
Bilirubin, Direct: 0.4 mg/dL — ABNORMAL HIGH (ref 0.0–0.3)
Total Bilirubin: 1.1 mg/dL (ref 0.2–1.2)
Total Protein: 6.6 g/dL (ref 6.0–8.3)

## 2022-03-14 NOTE — Telephone Encounter (Signed)
Lft pt vm to call ofc to sch US. thanks ?

## 2022-03-17 ENCOUNTER — Other Ambulatory Visit: Payer: Self-pay | Admitting: Family

## 2022-03-17 DIAGNOSIS — I10 Essential (primary) hypertension: Secondary | ICD-10-CM

## 2022-03-18 ENCOUNTER — Other Ambulatory Visit: Payer: Self-pay | Admitting: Family

## 2022-03-18 DIAGNOSIS — E039 Hypothyroidism, unspecified: Secondary | ICD-10-CM

## 2022-03-21 ENCOUNTER — Ambulatory Visit
Admission: RE | Admit: 2022-03-21 | Discharge: 2022-03-21 | Disposition: A | Discharge status: Home / Self Care | Payer: Medicare Other | Source: Ambulatory Visit | Attending: Family | Admitting: Family

## 2022-03-21 ENCOUNTER — Telehealth: Payer: Self-pay

## 2022-03-21 DIAGNOSIS — Z9049 Acquired absence of other specified parts of digestive tract: Secondary | ICD-10-CM | POA: Diagnosis not present

## 2022-03-21 DIAGNOSIS — R748 Abnormal levels of other serum enzymes: Secondary | ICD-10-CM | POA: Insufficient documentation

## 2022-03-21 DIAGNOSIS — R7989 Other specified abnormal findings of blood chemistry: Secondary | ICD-10-CM

## 2022-03-21 NOTE — Telephone Encounter (Signed)
Patient left a voicemail stating that her PCP order for her to have a Ultrasound done for her elevated LFT.She would like to know what Dr. [name] recommends for her LFT and if she needs a follow up appointment.

## 2022-03-21 NOTE — Progress Notes (Unsigned)
Referring Physician:  Harvest Dark, FNP [place],  [place] [address]  Primary Physician:  [name], FNP  History of Present Illness: 03/22/2022 [name] is here today with a chief complaint of low back pain that radiates into the right lateral thigh and calf. She also has neuropathy in both feet.  She has been having pain for many years.  Is been worsening over time.  She now has pain as bad as 10 out of 10 when she stands or walks.  Bending lifting also bother her.  She is having trouble cooking, which is one of her favorite activities.  Sitting and resting make her pain better.  Her pain is always on the right leg and in her right lower back.  She is having some trouble with her balance due to difficulty feeling her feet.  She has known neuropathy. Bowel/Bladder Dysfunction: none  Conservative measures: seen a chiropractor Physical therapy: has not participated in Multimodal medical therapy including regular antiinflammatories: flexeril, NSAIDs, hydrocodone, tylenol, gabapentin, duloxetine Injections: has received epidural steroid injections 12/17/2021: Right L5-S1 and right S1 transforaminal ESI (dexamethasone 13 mg)  10/15/2021: Right L5-S1 and right S1 transforaminal ESI (100% relief for one week)   Past Surgery: denies  Jennifer [name] has no symptoms of cervical myelopathy.  The symptoms are causing a significant impact on the patient's life.   I have utilized the care everywhere function in epic to review the outside records available from external health systems.  Review of Systems:  A 10 point review of systems is negative, except for the pertinent positives and negatives detailed in the HPI.  Past Medical History: Past Medical History:  Diagnosis Date   Anemia    Anxiety    Bariatric surgery status    Complication of anesthesia    Diificulty breathing for about 15 minutes after bariatric surgery   Constipation     COVID-19    Dysrhythmia    Elevated liver enzymes    GERD (gastroesophageal reflux disease)    Hemorrhoids    Herpes genitalis    High cholesterol    Hyperlipidemia    Hypertension    Hypothyroidism    IDA (iron deficiency anemia) 02/09/2021   Neuropathy    Osteoarthritis    Sleep apnea    CPAP    Past Surgical History: Past Surgical History:  Procedure Laterality Date   ABDOMINAL HYSTERECTOMY     total for fibroids no h/o abnormal pap   bariatric sleeve  2015   BREAST EXCISIONAL BIOPSY Left 1998   carpal tunnel repair     CATARACT EXTRACTION W/PHACO Right 11/01/2021   Procedure: CATARACT EXTRACTION PHACO AND INTRAOCULAR LENS PLACEMENT ([place]) RIGHT;  Surgeon: Eulogio Bear, MD;  Location: [place];  Service: Ophthalmology;  Laterality: Right;  sleep apnea 4.95 00:49.7   CATARACT EXTRACTION W/PHACO Left 11/15/2021   Procedure: CATARACT EXTRACTION PHACO AND INTRAOCULAR LENS PLACEMENT ([facility]) LEFT 4.94 00:32.3;  Surgeon: Eulogio Bear, MD;  Location: [place];  Service: Ophthalmology;  Laterality: Left;  sleep apnea   COLONOSCOPY WITH PROPOFOL N/A 02/11/2016   Procedure: COLONOSCOPY WITH PROPOFOL;  Surgeon: Jonathon Bellows, MD;  Location: [facility] ENDOSCOPY;  Service: Endoscopy;  Laterality: N/A;   COLONOSCOPY WITH PROPOFOL N/A 10/01/2019   Procedure: COLONOSCOPY WITH PROPOFOL;  Surgeon: Jonathon Bellows, MD;  Location: [facility] ENDOSCOPY;  Service: Gastroenterology;  Laterality: N/A;   COLONOSCOPY WITH PROPOFOL N/A 11/19/2020   Procedure: COLONOSCOPY WITH PROPOFOL;  Surgeon: Jonathon Bellows, MD;  Location: [facility] ENDOSCOPY;  Service: Gastroenterology;  Laterality: N/A;   ESOPHAGOGASTRODUODENOSCOPY (EGD) WITH PROPOFOL N/A 12/02/2019   Procedure: ESOPHAGOGASTRODUODENOSCOPY (EGD) WITH PROPOFOL;  Surgeon: Jonathon Bellows, MD;  Location: [facility] ENDOSCOPY;  Service: Gastroenterology;  Laterality: N/A;   ESOPHAGOGASTRODUODENOSCOPY (EGD) WITH PROPOFOL N/A 11/19/2020   Procedure:  ESOPHAGOGASTRODUODENOSCOPY (EGD) WITH PROPOFOL;  Surgeon: Jonathon Bellows, MD;  Location: [facility] ENDOSCOPY;  Service: Gastroenterology;  Laterality: N/A;   FLEXIBLE SIGMOIDOSCOPY N/A 02/06/2021   Procedure: FLEXIBLE SIGMOIDOSCOPY;  Surgeon: [name], DO;  Location: [facility] ENDOSCOPY;  Service: Gastroenterology;  Laterality: N/A;   GIVENS CAPSULE STUDY N/A 06/07/2021   Procedure: GIVENS CAPSULE STUDY;  Surgeon: Lin Landsman, MD;  Location: [facility] ENDOSCOPY;  Service: Gastroenterology;  Laterality: N/A;   GIVENS CAPSULE STUDY N/A 06/09/2021   Procedure: GIVENS CAPSULE STUDY;  Surgeon: Lin Landsman, MD;  Location: [facility] ENDOSCOPY;  Service: Gastroenterology;  Laterality: N/A;   HEMORRHOID SURGERY     LAPAROSCOPIC GASTRIC RESTRICTIVE DUODENAL PROCEDURE (DUODENAL SWITCH) Bilateral    2020    Allergies: Allergies as of 03/22/2022 - Review Complete 03/22/2022  Allergen Reaction Noted   Bee pollen Itching 11/17/2021   Celecoxib Hives 07/25/2016   Hydrocodone Nausea Only 06/02/2020   Pollen extract Itching    Sodium ferric gluconate [ferrous gluconate]  11/20/2020   Nasacort [triamcinolone] Other (See Comments) 12/07/2015   Vicodin [hydrocodone-acetaminophen] Nausea Only 09/03/2014    Medications: Current Meds  Medication Sig   acetaminophen (TYLENOL) 325 MG tablet Take 2 tablets (650 mg total) by mouth every 6 (six) hours as needed for mild pain (or Fever >/= 101).   acyclovir (ZOVIRAX) 400 MG tablet TAKE 1 TABLET BY MOUTH EVERY DAY   calcium citrate (CALCITRATE - DOSED IN MG ELEMENTAL CALCIUM) 950 (200 Ca) MG tablet Take 200 mg of elemental calcium by mouth daily.   fluticasone (FLONASE) 50 MCG/ACT nasal spray SPRAY 1 SPRAY INTO BOTH NOSTRILS DAILY.   gabapentin (NEURONTIN) 600 MG tablet Take 1 tablet (600 mg total) by mouth 3 (three) times daily.   hydrocortisone (ANUCORT-HC) 25 MG suppository UNWRAP AND INSERT 1 SUPPOSITORY RECTALLY TWICE A DAY   levothyroxine (SYNTHROID) 50  MCG tablet TAKE 1 TABLET (50 MCG TOTAL) BY MOUTH DAILY. DX CODE E03.9   losartan (COZAAR) 100 MG tablet TAKE 1 TABLET BY MOUTH EVERYDAY AT BEDTIME   Multiple Vitamin (MULTIVITAMIN WITH MINERALS) TABS tablet Take 1 tablet by mouth daily.   pantoprazole (PROTONIX) 40 MG tablet Take 1 tablet (40 mg total) by mouth 2 (two) times daily.   sodium chloride (OCEAN) 0.65 % SOLN nasal spray Place 2 sprays into both nostrils as needed for congestion.   traZODone (DESYREL) 50 MG tablet for sleep DX Code G47.00   triamterene-hydrochlorothiazide (DYAZIDE) 37.5-25 MG capsule TAKE 1 EACH (1 CAPSULE TOTAL) BY MOUTH DAILY.    Social History: Social History   Tobacco Use   Smoking status: Former    Packs/day: 1.50    Years: 24.00    Total pack years: 36.00    Types: Cigarettes    Quit date: 1993    Years since quitting: 31.1   Smokeless tobacco: Never   Tobacco comments:    quit 1995.   Vaping Use   Vaping Use: Never used  Substance Use Topics   Alcohol use: Yes    Alcohol/week: 1.0 standard drink of alcohol    Types: 1 Glasses of wine per week    Comment: 3-4 times a week   Drug  use: No    Family Medical History: Family History  Problem Relation Age of Onset   Breast cancer Sister 12       materal 1/2 sister   Hypertension Sister    Hypertension Mother    Heart disease Father    Hypertension Brother     Physical Examination: Vitals:   03/22/22 0855  BP: 128/72    General: Patient is well developed, well nourished, calm, collected, and in no apparent distress. Attention to examination is appropriate.  Neck:   Supple.  Full range of motion.  Respiratory: Patient is breathing without any difficulty.   NEUROLOGICAL:     Awake, alert, oriented to person, place, and time.  Speech is clear and fluent.   Cranial Nerves: Pupils equal round and reactive to light.  Facial tone is symmetric.  Facial sensation is symmetric. Shoulder shrug is symmetric. Tongue protrusion is midline.   There is no pronator drift.  ROM of spine: full.    Strength: Side Biceps Triceps Deltoid Interossei Grip Wrist Ext. Wrist Flex.  R '5 5 5 5 5 5 5  '$ L '5 5 5 5 5 5 5   '$ Side Iliopsoas Quads Hamstring PF DF EHL  R '5 5 5 5 5 5  '$ L '5 5 5 5 5 5   '$ Reflexes are 1+ and symmetric at the biceps, triceps, brachioradialis, patella and achilles.   Hoffman's is absent.   Bilateral upper and lower extremity sensation is intact to light touch but diminished below knee bilaterally.    No evidence of dysmetria noted.  Gait is slowed.  Straight leg raise is negative bilaterally..     Medical Decision Making  Imaging: MRI L spine 03/09/2022 IMPRESSION: 1. Stable mild bilateral lateral recess stenosis at L2-3 and L3-4. 2. Stable mild spinal and bilateral lateral recess stenosis and mild bilateral foraminal stenosis at L4-5. 3. Stable advanced lower lumbar facet disease.     Electronically Signed   By: Marijo Sanes M.D.   On: 03/12/2022 10:32  I have personally reviewed the images and agree with the above interpretation.  Assessment and Plan: Ms. Hirani is a pleasant 72 y.o. female with anterolisthesis at L4-5 that was not present 10 years ago.  She has some lateral recess stenosis at L4-5 due to this.  I think this is likely the culprit for her pain.  She also has substantial facet arthrosis at L4-5.  I have recommended physical therapy.  She has a decent chance of improvement with this.  She has open wounds on her feet that would need to be healed before any consideration of operative intervention should she fail conservative management.  I will see her back in approximately 8 weeks.  We will obtain L-spine flexion-extension x-rays at that time.  Thank you for involving me in the care of this patient.      Dene Landsberg K. Izora Ribas MD, [facility] Neurosurgery

## 2022-03-22 ENCOUNTER — Encounter: Payer: Self-pay | Admitting: Neurosurgery

## 2022-03-22 ENCOUNTER — Ambulatory Visit (INDEPENDENT_AMBULATORY_CARE_PROVIDER_SITE_OTHER): Payer: Medicare Other | Admitting: Neurosurgery

## 2022-03-22 VITALS — BP 128/72 | Ht 61.0 in | Wt 184.0 lb

## 2022-03-22 DIAGNOSIS — G8929 Other chronic pain: Secondary | ICD-10-CM | POA: Diagnosis not present

## 2022-03-22 DIAGNOSIS — M4310 Spondylolisthesis, site unspecified: Secondary | ICD-10-CM | POA: Diagnosis not present

## 2022-03-22 DIAGNOSIS — M5441 Lumbago with sciatica, right side: Secondary | ICD-10-CM

## 2022-03-22 NOTE — Telephone Encounter (Signed)
Dr. [name], can you please review her labs and ultrasound and let me know what you recommend as she was to follow up with you as needed basis. Thank you.

## 2022-03-23 ENCOUNTER — Encounter (INDEPENDENT_AMBULATORY_CARE_PROVIDER_SITE_OTHER): Payer: Self-pay | Admitting: Nurse Practitioner

## 2022-03-23 ENCOUNTER — Ambulatory Visit (INDEPENDENT_AMBULATORY_CARE_PROVIDER_SITE_OTHER): Payer: Medicare Other | Admitting: Nurse Practitioner

## 2022-03-23 ENCOUNTER — Ambulatory Visit (INDEPENDENT_AMBULATORY_CARE_PROVIDER_SITE_OTHER): Payer: Medicare Other

## 2022-03-23 VITALS — BP 106/57 | HR 64 | Resp 16 | Wt 185.0 lb

## 2022-03-23 DIAGNOSIS — S91301A Unspecified open wound, right foot, initial encounter: Secondary | ICD-10-CM

## 2022-03-23 DIAGNOSIS — I10 Essential (primary) hypertension: Secondary | ICD-10-CM | POA: Diagnosis not present

## 2022-03-23 DIAGNOSIS — S91302A Unspecified open wound, left foot, initial encounter: Secondary | ICD-10-CM

## 2022-03-23 DIAGNOSIS — L97412 Non-pressure chronic ulcer of right heel and midfoot with fat layer exposed: Secondary | ICD-10-CM

## 2022-03-23 NOTE — Progress Notes (Signed)
Subjective:    Patient ID: Jennifer [name], female    DOB: 03/11/1950, 72 y.o.   MRN: [id number] Chief Complaint  Patient presents with   Follow-up    Ref [facility] consult right heel ulcer with fat lay exposed    Jennifer [name] is a 72 year old female who presents today as a referral from Dr. Sherryle Lis with concerns for delayed wound healing.  The patient has a wound on the left heel as well as a new 1 on the right heel lateral portion.  She has been undergoing wound care by podiatry but they have been slow to heal.  She does have an upcoming evaluation with wound care.  She notes that the wounds are sore and uncomfortable there is no signs symptoms of infection currently.  It was noted that the patient had a somewhat dampened PT pulse and there was concern that the patient may have an adequate perfusion.  Today the patient has an ABI of 1.20 on the right and 1.26 on the left.  She has triphasic tibial artery waveforms bilaterally, in both the anterior tibial and posterior tibial arteries and normal toe waveforms bilaterally.    Review of Systems  Skin:  Positive for wound.  All other systems reviewed and are negative.      Objective:   Physical Exam Vitals reviewed.  HENT:     Head: Normocephalic.  Cardiovascular:     Rate and Rhythm: Normal rate.     Pulses:          Dorsalis pedis pulses are detected w/ Doppler on the right side and detected w/ Doppler on the left side.       Posterior tibial pulses are detected w/ Doppler on the right side and detected w/ Doppler on the left side.  Pulmonary:     Effort: Pulmonary effort is normal.  Skin:    General: Skin is warm and dry.  Neurological:     Mental Status: She is alert and oriented to person, place, and time.  Psychiatric:        Mood and Affect: Mood normal.        Behavior: Behavior normal.        Thought Content: Thought content normal.        Judgment: Judgment normal.     BP (!) 106/57 (BP Location: Right Arm)    Pulse 64   Resp 16   Wt 185 lb (83.9 kg)   BMI 34.96 kg/m   Past Medical History:  Diagnosis Date   Anemia    Anxiety    Bariatric surgery status    Complication of anesthesia    Diificulty breathing for about 15 minutes after bariatric surgery   Constipation    COVID-19    Dysrhythmia    Elevated liver enzymes    GERD (gastroesophageal reflux disease)    Hemorrhoids    Herpes genitalis    High cholesterol    Hyperlipidemia    Hypertension    Hypothyroidism    IDA (iron deficiency anemia) 02/09/2021   Neuropathy    Osteoarthritis    Sleep apnea    CPAP    Social History   Socioeconomic History   Marital status: Married    Spouse name: Not on file   Number of children: Not on file   Years of education: Not on file   Highest education level: Not on file  Occupational History   Not on file  Tobacco Use   Smoking status: Former  Packs/day: 1.50    Years: 24.00    Total pack years: 36.00    Types: Cigarettes    Quit date: 27    Years since quitting: 31.1   Smokeless tobacco: Never   Tobacco comments:    quit 1995.   Vaping Use   Vaping Use: Never used  Substance and Sexual Activity   Alcohol use: Yes    Alcohol/week: 1.0 standard drink of alcohol    Types: 1 Glasses of wine per week    Comment: 3-4 times a week   Drug use: No   Sexual activity: Not Currently    Birth control/protection: Surgical    Comment: Hysterectomy  Other Topics Concern   Not on file  Social History Narrative   Lives in [place].    Married.    Retired 2015, Interior and spatial designer.    One son; granddaughter.    Left handed    Caffeine- decaf coffee.    Social Determinants of Health   Financial Resource Strain: Low Risk  (09/28/2021)   Overall Financial Resource Strain (CARDIA)    Difficulty of Paying Living Expenses: Not hard at all  Food Insecurity: No Food Insecurity (09/28/2021)   Hunger Vital Sign    Worried About Running Out of Food in the Last Year: Never true     Ran Out of Food in the Last Year: Never true  Transportation Needs: No Transportation Needs (09/28/2021)   PRAPARE - Hydrologist (Medical): No    Lack of Transportation (Non-Medical): No  Physical Activity: Insufficiently Active (09/06/2019)   Exercise Vital Sign    Days of Exercise per Week: 3 days    Minutes of Exercise per Session: 20 min  Stress: No Stress Concern Present (09/28/2021)   [place]    Feeling of Stress : Not at all  Social Connections: Unknown (09/28/2021)   Social Connection and Isolation Panel [NHANES]    Frequency of Communication with Friends and Family: More than three times a week    Frequency of Social Gatherings with Friends and Family: More than three times a week    Attends Religious Services: Not on file    Active Member of Clubs or Organizations: Not on file    Attends Archivist Meetings: Not on file    Marital Status: Married  Intimate Partner Violence: Not At Risk (09/28/2021)   Humiliation, Afraid, Rape, and Kick questionnaire    Fear of Current or Ex-Partner: No    Emotionally Abused: No    Physically Abused: No    Sexually Abused: No    Past Surgical History:  Procedure Laterality Date   ABDOMINAL HYSTERECTOMY     total for fibroids no h/o abnormal pap   bariatric sleeve  2015   BREAST EXCISIONAL BIOPSY Left 1998   carpal tunnel repair     CATARACT EXTRACTION W/PHACO Right 11/01/2021   Procedure: CATARACT EXTRACTION PHACO AND INTRAOCULAR LENS PLACEMENT ([place]) RIGHT;  Surgeon: Eulogio Bear, MD;  Location: [place];  Service: Ophthalmology;  Laterality: Right;  sleep apnea 4.95 00:49.7   CATARACT EXTRACTION W/PHACO Left 11/15/2021   Procedure: CATARACT EXTRACTION PHACO AND INTRAOCULAR LENS PLACEMENT ([facility]) LEFT 4.94 00:32.3;  Surgeon: Eulogio Bear, MD;  Location: [place];  Service: Ophthalmology;   Laterality: Left;  sleep apnea   COLONOSCOPY WITH PROPOFOL N/A 02/11/2016   Procedure: COLONOSCOPY WITH PROPOFOL;  Surgeon: Jonathon Bellows, MD;  Location: [facility] ENDOSCOPY;  Service: Endoscopy;  Laterality: N/A;   COLONOSCOPY WITH PROPOFOL N/A 10/01/2019   Procedure: COLONOSCOPY WITH PROPOFOL;  Surgeon: Jonathon Bellows, MD;  Location: [facility] ENDOSCOPY;  Service: Gastroenterology;  Laterality: N/A;   COLONOSCOPY WITH PROPOFOL N/A 11/19/2020   Procedure: COLONOSCOPY WITH PROPOFOL;  Surgeon: Jonathon Bellows, MD;  Location: [facility] ENDOSCOPY;  Service: Gastroenterology;  Laterality: N/A;   ESOPHAGOGASTRODUODENOSCOPY (EGD) WITH PROPOFOL N/A 12/02/2019   Procedure: ESOPHAGOGASTRODUODENOSCOPY (EGD) WITH PROPOFOL;  Surgeon: Jonathon Bellows, MD;  Location: [facility] ENDOSCOPY;  Service: Gastroenterology;  Laterality: N/A;   ESOPHAGOGASTRODUODENOSCOPY (EGD) WITH PROPOFOL N/A 11/19/2020   Procedure: ESOPHAGOGASTRODUODENOSCOPY (EGD) WITH PROPOFOL;  Surgeon: Jonathon Bellows, MD;  Location: [facility] ENDOSCOPY;  Service: Gastroenterology;  Laterality: N/A;   FLEXIBLE SIGMOIDOSCOPY N/A 02/06/2021   Procedure: FLEXIBLE SIGMOIDOSCOPY;  Surgeon: [name], DO;  Location: [facility] ENDOSCOPY;  Service: Gastroenterology;  Laterality: N/A;   GIVENS CAPSULE STUDY N/A 06/07/2021   Procedure: GIVENS CAPSULE STUDY;  Surgeon: Lin Landsman, MD;  Location: [facility] ENDOSCOPY;  Service: Gastroenterology;  Laterality: N/A;   GIVENS CAPSULE STUDY N/A 06/09/2021   Procedure: GIVENS CAPSULE STUDY;  Surgeon: Lin Landsman, MD;  Location: [facility] ENDOSCOPY;  Service: Gastroenterology;  Laterality: N/A;   HEMORRHOID SURGERY     LAPAROSCOPIC GASTRIC RESTRICTIVE DUODENAL PROCEDURE (DUODENAL SWITCH) Bilateral    2020    Family History  Problem Relation Age of Onset   Breast cancer Sister 51       materal 1/2 sister   Hypertension Sister    Hypertension Mother    Heart disease Father    Hypertension Brother     Allergies  Allergen Reactions   Bee  Pollen Itching   Celecoxib Hives   Hydrocodone Nausea Only   Pollen Extract Itching   Sodium Ferric Gluconate [Ferrous Gluconate]     IV ferric gluconate - shortly after the infusion developed back pain shooting into left arm and bilateral hand swelling   Nasacort [Triamcinolone] Other (See Comments)    Nasal - Nose Bleeds   Vicodin [Hydrocodone-Acetaminophen] Nausea Only       Latest Ref Rng & Units 12/31/2021   12:37 PM 11/19/2021   10:30 AM 11/17/2021   12:08 PM  CBC  WBC 4.0 - 10.5 K/uL 4.7  5.3  5.9   Hemoglobin 12.0 - 15.0 g/dL 10.9  9.9  10.7   Hematocrit 36.0 - 46.0 % 34.3  31.4  33.5   Platelets 150 - 400 K/uL 203  178  190       CMP     Component Value Date/Time   NA 136 12/31/2021 1237   NA 138 10/14/2020 1038   NA 139 06/26/2013 0409   K 3.3 (L) 12/31/2021 1237   K 4.3 06/26/2013 0409   CL 102 12/31/2021 1237   CL 106 06/26/2013 0409   CO2 25 12/31/2021 1237   CO2 28 06/26/2013 0409   GLUCOSE 105 (H) 12/31/2021 1237   GLUCOSE 108 (H) 06/26/2013 0409   BUN 7 (L) 12/31/2021 1237   BUN 11 10/14/2020 1038   BUN 12 06/26/2013 0409   CREATININE 0.63 12/31/2021 1237   CREATININE 0.72 06/26/2013 0409   CALCIUM 8.6 (L) 12/31/2021 1237   CALCIUM 8.5 06/26/2013 0409   PROT 6.6 03/14/2022 0756   PROT 7.2 10/14/2020 1038   ALBUMIN 3.7 03/14/2022 0756   ALBUMIN 4.9 (H) 10/14/2020 1038   ALBUMIN 3.6 06/26/2013 0409   AST 166 (H) 03/14/2022 0756   ALT 53 (H) 03/14/2022  0756   ALKPHOS 159 (H) 03/14/2022 0756   BILITOT 1.1 03/14/2022 0756   BILITOT 0.3 10/14/2020 1038   GFRNONAA >60 12/31/2021 1237   GFRNONAA >60 06/26/2013 0409   GFRAA >60 05/24/2019 1420   GFRAA >60 06/26/2013 0409     No results found.     Assessment & Plan:   1. Open wound of left heel, initial encounter Today the patient's noninvasive studies show that she should have adequate perfusion for wound healing.  I had a long discussion with the patient that typically wounds on the heel are  more difficult to heal due to their location and constant rubbing and irritation from walking in shoes, etc.  The patient will be seeing wound care tomorrow.  Based on this we do not recommend any intervention at this time.  However, I have discussed with the patient that if despite appropriate wound care over the next 12 weeks or so, there has been absolutely no improvement to her wound we may consider angiogram for further evaluation.  Otherwise if the patient is continuing to heal we will plan on having her follow-up with [name] on an as-needed basis.  2. Essential hypertension Continue antihypertensive medications as already ordered, these medications have been reviewed and there are no changes at this time.   Current Outpatient Medications on File Prior to Visit  Medication Sig Dispense Refill   acetaminophen (TYLENOL) 325 MG tablet Take 2 tablets (650 mg total) by mouth every 6 (six) hours as needed for mild pain (or Fever >/= 101).     acyclovir (ZOVIRAX) 400 MG tablet TAKE 1 TABLET BY MOUTH EVERY DAY 30 tablet 5   calcium citrate (CALCITRATE - DOSED IN MG ELEMENTAL CALCIUM) 950 (200 Ca) MG tablet Take 200 mg of elemental calcium by mouth daily.     fluticasone (FLONASE) 50 MCG/ACT nasal spray SPRAY 1 SPRAY INTO BOTH NOSTRILS DAILY. 16 mL 2   gabapentin (NEURONTIN) 600 MG tablet Take 1 tablet (600 mg total) by mouth 3 (three) times daily. 90 tablet 3   hydrocortisone (ANUCORT-HC) 25 MG suppository UNWRAP AND INSERT 1 SUPPOSITORY RECTALLY TWICE A DAY 12 suppository 2   levothyroxine (SYNTHROID) 50 MCG tablet TAKE 1 TABLET (50 MCG TOTAL) BY MOUTH DAILY. DX CODE E03.9 90 tablet 1   losartan (COZAAR) 100 MG tablet TAKE 1 TABLET BY MOUTH EVERYDAY AT BEDTIME 90 tablet 1   Multiple Vitamin (MULTIVITAMIN WITH MINERALS) TABS tablet Take 1 tablet by mouth daily.     pantoprazole (PROTONIX) 40 MG tablet Take 1 tablet (40 mg total) by mouth 2 (two) times daily. 180 tablet 3   sodium chloride (OCEAN) 0.65 %  SOLN nasal spray Place 2 sprays into both nostrils as needed for congestion. 30 mL 2   traZODone (DESYREL) 50 MG tablet for sleep DX Code G47.00 90 tablet 0   triamterene-hydrochlorothiazide (DYAZIDE) 37.5-25 MG capsule TAKE 1 EACH (1 CAPSULE TOTAL) BY MOUTH DAILY. 90 capsule 2   No current facility-administered medications on file prior to visit.    There are no Patient Instructions on file for this visit. No follow-ups on file.   Kris Hartmann, NP

## 2022-03-24 ENCOUNTER — Encounter: Payer: Medicare Other | Attending: Internal Medicine | Admitting: Internal Medicine

## 2022-03-24 DIAGNOSIS — L89892 Pressure ulcer of other site, stage 2: Secondary | ICD-10-CM | POA: Diagnosis not present

## 2022-03-24 DIAGNOSIS — I10 Essential (primary) hypertension: Secondary | ICD-10-CM | POA: Insufficient documentation

## 2022-03-24 DIAGNOSIS — E039 Hypothyroidism, unspecified: Secondary | ICD-10-CM | POA: Diagnosis not present

## 2022-03-24 DIAGNOSIS — G629 Polyneuropathy, unspecified: Secondary | ICD-10-CM | POA: Insufficient documentation

## 2022-03-24 DIAGNOSIS — E7800 Pure hypercholesterolemia, unspecified: Secondary | ICD-10-CM | POA: Insufficient documentation

## 2022-03-24 DIAGNOSIS — L97528 Non-pressure chronic ulcer of other part of left foot with other specified severity: Secondary | ICD-10-CM | POA: Insufficient documentation

## 2022-03-24 DIAGNOSIS — L89612 Pressure ulcer of right heel, stage 2: Secondary | ICD-10-CM | POA: Diagnosis not present

## 2022-03-24 DIAGNOSIS — G4730 Sleep apnea, unspecified: Secondary | ICD-10-CM | POA: Insufficient documentation

## 2022-03-24 DIAGNOSIS — K219 Gastro-esophageal reflux disease without esophagitis: Secondary | ICD-10-CM | POA: Diagnosis not present

## 2022-03-24 DIAGNOSIS — G9009 Other idiopathic peripheral autonomic neuropathy: Secondary | ICD-10-CM | POA: Insufficient documentation

## 2022-03-26 NOTE — Progress Notes (Signed)
DINASIA, SHROFF ([id number]) [id number] Page 1 of 10 Visit Report for 03/24/2022 Chief Complaint Document Details Patient Name: Date of Service: Jennifer [name] 03/24/2022 10:30 A M Medical Record Number: [id number] Patient Account Number: [id number] Date of Birth/Sex: Treating RN: March 02, 1950 (72 y.o. Jennifer [name] Primary Care Provider: [name] Other Clinician: Referring Provider: Treating Provider/Extender: Eldridge Dace, [name] in Treatment: 0 Information Obtained from: Patient Chief Complaint 03/24/2022; patient is here for review of wounds on the 2 locations on the left lateral foot Electronic Signature(s) Signed: 03/24/2022 4:44:08 PM By: Linton Ham MD Entered By: Linton Ham on 03/24/2022 12:21:35 -------------------------------------------------------------------------------- Debridement Details Patient Name: Date of Service: Jennifer [name] [facility] 03/24/2022 10:30 A M Medical Record Number: [id number] Patient Account Number: [id number] Date of Birth/Sex: Treating RN: 1950-10-25 (72 y.o. Jennifer [name] Primary Care Provider: [name] Other Clinician: Referring Provider: Treating Provider/Extender: RO BSO Delane Ginger, [name] in Treatment: 0 Debridement Performed for Assessment: Wound #1 Right,Posterior Calcaneus Performed By: Physician Ricard Dillon, MD Debridement Type: Debridement Level of Consciousness (Pre-procedure): Awake and Alert Pre-procedure Verification/Time Out No Taken: Start Time: 11:19 T Area Debrided (L x W): otal 1 (cm) x 0.6 (cm) = 0.6 (cm) Tissue and other material debrided: Viable, Non-Viable, Slough, Subcutaneous, Slough Level: Skin/Subcutaneous Tissue Debridement Description: Excisional Instrument: Curette Bleeding: Moderate Hemostasis Achieved: Silver Nitrate Response to Treatment: Procedure was tolerated well Level of Consciousness (Post- Awake and  Alert procedure): Post Debridement Measurements of Total Wound IDABELL, STULTZ ([id number]) [phone] Page 2 of 10 Length: (cm) 1 Stage: Category/Stage II Width: (cm) 0.5 Depth: (cm) 0.2 Volume: (cm) 0.079 Character of Wound/Ulcer Post Debridement: Stable Post Procedure Diagnosis Same as Pre-procedure Electronic Signature(s) Signed: 03/24/2022 4:44:08 PM By: Linton Ham MD Signed: 03/24/2022 5:13:55 PM By: Rosalio Loud MSN RN CNS WTA Entered By: Linton Ham on 03/24/2022 12:18:45 -------------------------------------------------------------------------------- Debridement Details Patient Name: Date of Service: Jennifer [name] [facility] 03/24/2022 10:30 A M Medical Record Number: [id number] Patient Account Number: [id number] Date of Birth/Sex: Treating RN: 05-Jan-1951 (72 y.o. Jennifer [name] Primary Care Provider: [name] Other Clinician: Referring Provider: Treating Provider/Extender: RO BSO Delane Ginger, [name], [name] in Treatment: 0 Debridement Performed for Assessment: Wound #2 Right,Lateral Foot Performed By: Physician Ricard Dillon, MD Debridement Type: Debridement Level of Consciousness (Pre-procedure): Awake and Alert Pre-procedure Verification/Time Out No Taken: Start Time: 11:19 T Area Debrided (L x W): otal 0.5 (cm) x 0.7 (cm) = 0.35 (cm) Tissue and other material debrided: Viable, Non-Viable, Slough, Subcutaneous, Slough Level: Skin/Subcutaneous Tissue Debridement Description: Excisional Instrument: Curette Bleeding: Moderate Hemostasis Achieved: Silver Nitrate Response to Treatment: Procedure was tolerated well Level of Consciousness (Post- Awake and Alert procedure): Post Debridement Measurements of Total Wound Length: (cm) 0.5 Stage: Category/Stage II Width: (cm) 0.7 Depth: (cm) 0.2 Volume: (cm) 0.055 Character of Wound/Ulcer Post Debridement: Stable Post Procedure Diagnosis Same as  Pre-procedure Electronic Signature(s) Signed: 03/24/2022 4:44:08 PM By: Linton Ham MD Signed: 03/24/2022 5:13:55 PM By: Rosalio Loud MSN RN CNS WTA Entered By: Linton Ham on 03/24/2022 12:19:01 Jennifer [name] ([id number]) [id number] Page 3 of 10 -------------------------------------------------------------------------------- HPI Details Patient Name: Date of Service: Jennifer [name] 03/24/2022 10:30 A M Medical Record Number: [id number] Patient Account Number: [id number] Date of Birth/Sex: Treating RN: 1950/10/26 (72 y.o. Jennifer [name] Primary Care Provider: [name] Other Clinician: Referring Provider: Treating Provider/Extender: RO BSO Delane Ginger, [name], [name] in Treatment: 0 History  of Present Illness HPI Description: ADMISSION 03/24/2022 This is a 72 year old woman who has had 2 small open areas on the right lateral foot. She is not a diabetic but does have peripheral neuropathy. She was seen by [facility] yesterday. ABIs w She has not had previous wounds in this area. ABI 1.20 on the right and TBI at 1.02. On the left 1.23 and 1.00 on the left Past medical history; is not noted not a diabetic. She has gastroesophageal reflux disease, high cholesterol, hyperlipidemia, hypertension, hypothyroidism, sleep apnea Electronic Signature(s) Signed: 03/24/2022 4:44:08 PM By: Linton Ham MD Entered By: Linton Ham on 03/24/2022 12:26:10 -------------------------------------------------------------------------------- Physical Exam Details Patient Name: Date of Service: Jennifer [name] 03/24/2022 10:30 A M Medical Record Number: [id number] Patient Account Number: [id number] Date of Birth/Sex: Treating RN: [birth year] (72 y.o. Jennifer [name] Primary Care Provider: [name] Other Clinician: Referring Provider: Treating Provider/Extender: RO BSO N, [name], [name] in Treatment:  0 Constitutional Sitting or standing Blood Pressure is within target range for patient.. Pulse regular and within target range for patient.[name] Kitchen Respirations regular, non-labored and within target range.. Temperature is normal and within the target range for the patient.[name] Kitchen appears in no distress. Cardiovascular . Notes Pedal pulses are easily palpable. The patient has had wounds on the right lateral foot for about a month. No evidence of infection. The more distal area was debrided also of the proximal area they cleaned up quite nicely. Electronic Signature(s) Rock [name], [name] ([id number]) [id number] Page 4 of 10 Signed: 03/24/2022 4:44:08 PM By: Linton Ham MD Entered By: Linton Ham on 03/24/2022 12:27:22 -------------------------------------------------------------------------------- Physician Orders Details Patient Name: Date of Service: Jennifer [name] 03/24/2022 10:30 A M Medical Record Number: [id number] Patient Account Number: [id number] Date of Birth/Sex: Treating RN: [birth year] (72 y.o. Jennifer [name] Primary Care Provider: [name] Other Clinician: Referring Provider: Treating Provider/Extender: RO BSO Delane Ginger, [place] EL Nyra Capes, [name] in Treatment: 0 Verbal / Phone Orders: No Diagnosis Coding Follow-up Appointments Return Appointment in 2 weeks. Bathing/ [employer] wounds with antibacterial soap and water. Off-Loading Heel suspension boot Wound Treatment Wound #1 - Calcaneus Wound Laterality: Right, Posterior Cleanser: Soap and Water 1 x Per Day/30 Days Discharge Instructions: Gently cleanse wound with antibacterial soap, rinse and pat dry prior to dressing wounds Peri-Wound Care: Moisturizing Lotion 1 x Per Day/30 Days Discharge Instructions: Suggestions: Theraderm, Eucerin, Cetaphil, or patient preference. Prim Dressing: Hydrofera Blue Ready Transfer Foam, 2.5x2.5 (in/in) (DME) (Generic) 1 x Per Day/30  Days ary Discharge Instructions: Apply Hydrofera Blue Ready to wound bed as directed Secondary Dressing: ABD Pad 5x9 (in/in) 1 x Per Day/30 Days Discharge Instructions: Cover with ABD pad Secured With: Kerlix Roll Sterile or Non-Sterile 6-ply 4.5x4 (yd/yd) 1 x Per Day/30 Days Discharge Instructions: Apply Kerlix as directed Wound #2 - Foot Wound Laterality: Right, Lateral Cleanser: Byram Ancillary Kit - 15 Day Supply (DME) (Generic) 1 x Per Day/30 Days Discharge Instructions: Use supplies as instructed; Kit contains: (15) Saline Bullets; (15) 3x3 Gauze; 15 pr Gloves Cleanser: Soap and Water 1 x Per Day/30 Days Discharge Instructions: Gently cleanse wound with antibacterial soap, rinse and pat dry prior to dressing wounds Peri-Wound Care: Moisturizing Lotion 1 x Per Day/30 Days Discharge Instructions: Suggestions: Theraderm, Eucerin, Cetaphil, or patient preference. Prim Dressing: Hydrofera Blue Ready Transfer Foam, 2.5x2.5 (in/in) (DME) (Generic) 1 x Per Day/30 Days ary Discharge Instructions: Apply Hydrofera Blue Ready to wound bed as directed Secondary Dressing: ABD  Pad 5x9 (in/in) 1 x Per Day/30 Days Discharge Instructions: Cover with ABD pad Secured With: Kerlix Roll Sterile or Non-Sterile 6-ply 4.5x4 (yd/yd) 1 x Per Day/30 Days Discharge Instructions: Apply Kerlix as directed DEVON, LOVEN ([id number]) [id number].pdf Page 5 of 10 Electronic Signature(s) Signed: 03/24/2022 4:44:08 PM By: Linton Ham MD Signed: 03/24/2022 5:13:55 PM By: Rosalio Loud MSN RN CNS WTA Entered By: Rosalio Loud on 03/24/2022 11:38:05 -------------------------------------------------------------------------------- Problem List Details Patient Name: Date of Service: Jennifer [name] [facility] 03/24/2022 10:30 A M Medical Record Number: [id number] Patient Account Number: [id number] Date of Birth/Sex: Treating RN: 1950-04-18 (72 y.o. Jennifer [name] Primary Care Provider: [name]  Other Clinician: Referring Provider: Treating Provider/Extender: Eldridge Dace, [name], [name] in Treatment: 0 Active Problems ICD-10 Encounter Code Description Active Date MDM Diagnosis G90.09 Other idiopathic peripheral autonomic neuropathy 03/24/2022 No Yes L97.528 Non-pressure chronic ulcer of other part of left foot with other specified 03/24/2022 No Yes severity Inactive Problems Resolved Problems Electronic Signature(s) Signed: 03/24/2022 4:44:08 PM By: Linton Ham MD Entered By: Linton Ham on 03/24/2022 12:18:06 -------------------------------------------------------------------------------- Progress Note Details Patient Name: Date of Service: Jennifer [name] 03/24/2022 10:30 A M Medical Record Number: [id number] Patient Account Number: [id number] Date of Birth/Sex: Treating RN: 1950-09-04 (72 y.o. Jennifer [name] Primary Care Provider: [name] Other Clinician: Referring Provider: Treating Provider/Extender: Eldridge Dace, [name], [name] in Treatment: 0 Subjective Jennifer [name], [name] ([id number]) [id number] Page 6 of 10 Chief Complaint Information obtained from Patient 03/24/2022; patient is here for review of wounds on the 2 locations on the left lateral foot History of Present Illness (HPI) ADMISSION 03/24/2022 This is a 72 year old woman who has had 2 small open areas on the right lateral foot. She is not a diabetic but does have peripheral neuropathy. She was seen by [facility] yesterday. ABIs w She has not had previous wounds in this area. ABI 1.20 on the right and TBI at 1.02. On the left 1.23 and 1.00 on the left Past medical history; is not noted not a diabetic. She has gastroesophageal reflux disease, high cholesterol, hyperlipidemia, hypertension, hypothyroidism, sleep apnea Patient History Information obtained from Patient. Allergies No Known Allergies Social History Never  smoker, Marital Status - Married, Alcohol Use - Moderate, Drug Use - No History, Caffeine Use - Never. Medical History Eyes Patient has history of Cataracts - surgery Cardiovascular Patient has history of Hypertension Endocrine Denies history of Type I Diabetes, Type II Diabetes Integumentary (Skin) Patient has history of History of pressure wounds Denies history of History of Burn Neurologic Patient has history of Neuropathy Review of Systems (ROS) Constitutional Symptoms (General Health) Denies complaints or symptoms of Fatigue, Fever, Chills, Marked Weight Change. Eyes Complains or has symptoms of Glasses / Contacts. Denies complaints or symptoms of Dry Eyes, Vision Changes. Ear/Nose/Mouth/Throat Denies complaints or symptoms of Difficult clearing ears, Sinusitis. Hematologic/Lymphatic Denies complaints or symptoms of Bleeding / Clotting Disorders, Human Immunodeficiency Virus. Respiratory Denies complaints or symptoms of Chronic or frequent coughs, Shortness of Breath. Genitourinary Denies complaints or symptoms of Kidney failure/ Dialysis, Incontinence/dribbling. Immunological Denies complaints or symptoms of Hives, Itching. Integumentary (Skin) Complains or has symptoms of Wounds. Musculoskeletal Denies complaints or symptoms of Muscle Pain, Muscle Weakness. Objective Constitutional Sitting or standing Blood Pressure is within target range for patient.. Pulse regular and within target range for patient.[name] Kitchen Respirations regular, non-labored and within target range.. Temperature is normal and within the target range for the patient.[name] Kitchen  appears in no distress. Vitals Time Taken: 10:39 AM, Height: 61 in, Source: Stated, Weight: 185 lbs, Source: Stated, BMI: 35, Temperature: 98.0 F, Pulse: 62 bpm, Respiratory Rate: 16 breaths/min, Blood Pressure: 120/70 mmHg. General Notes: Pedal pulses are easily palpable. The patient has had wounds on the right lateral foot for about a month.  No evidence of infection. The more distal area was debrided also of the proximal area they cleaned up quite nicely. Integumentary (Hair, Skin) Wound #1 status is Open. Original cause of wound was Pressure Injury. The date acquired was: 01/13/2022. The wound is located on the Right,Posterior Calcaneus. The wound measures 1cm length x 0.6cm width x 0.1cm depth; 0.471cm^2 area and 0.047cm^3 volume. There is Fat Layer (Subcutaneous Tissue) exposed. There is a medium amount of serosanguineous drainage noted. There is small (1-33%) red, pink granulation within the wound bed. There is no necrotic tissue within the wound bed. Jennifer [name], Jennifer [name] ([id number]) [id number] Page 7 of 10 Wound #2 status is Open. Original cause of wound was Pressure Injury. The date acquired was: 01/13/2022. The wound is located on the Right,Lateral Foot. The wound measures 0.5cm length x 0.7cm width x 0.1cm depth; 0.275cm^2 area and 0.027cm^3 volume. There is a medium amount of serosanguineous drainage noted. There is small (1-33%) red granulation within the wound bed. There is no necrotic tissue within the wound bed. Assessment Active Problems ICD-10 Other idiopathic peripheral autonomic neuropathy Non-pressure chronic ulcer of other part of left foot with other specified severity Procedures Wound #1 Pre-procedure diagnosis of Wound #1 is a Pressure Ulcer located on the Right,Posterior Calcaneus . There was a Excisional Skin/Subcutaneous Tissue Debridement with a total area of 0.6 sq cm performed by Ricard Dillon, MD. With the following instrument(s): Curette to remove Viable and Non- Viable tissue/material. Material removed includes Subcutaneous Tissue and Slough and. No specimens were taken.A Moderate amount of bleeding was controlled with Silver Nitrate. The procedure was tolerated well. Post Debridement Measurements: 1cm length x 0.5cm width x 0.2cm depth; 0.079cm^3 volume. Post  debridement Stage noted as Category/Stage II. Character of Wound/Ulcer Post Debridement is stable. Post procedure Diagnosis Wound #1: Same as Pre-Procedure Wound #2 Pre-procedure diagnosis of Wound #2 is a Pressure Ulcer located on the Right,Lateral Foot . There was a Excisional Skin/Subcutaneous Tissue Debridement with a total area of 0.35 sq cm performed by Ricard Dillon, MD. With the following instrument(s): Curette to remove Viable and Non-Viable tissue/material. Material removed includes Subcutaneous Tissue and Slough and. No specimens were taken.A Moderate amount of bleeding was controlled with Silver Nitrate. The procedure was tolerated well. Post Debridement Measurements: 0.5cm length x 0.7cm width x 0.2cm depth; 0.055cm^3 volume. Post debridement Stage noted as Category/Stage II. Character of Wound/Ulcer Post Debridement is stable. Post procedure Diagnosis Wound #2: Same as Pre-Procedure Plan Follow-up Appointments: Return Appointment in 2 weeks. Bathing/ Shower/ Hygiene: Wash wounds with antibacterial soap and water. Off-Loading: Heel suspension boot WOUND #1: - Calcaneus Wound Laterality: Right, Posterior Cleanser: Soap and Water 1 x Per Day/30 Days Discharge Instructions: Gently cleanse wound with antibacterial soap, rinse and pat dry prior to dressing wounds Peri-Wound Care: Moisturizing Lotion 1 x Per Day/30 Days Discharge Instructions: Suggestions: Theraderm, Eucerin, Cetaphil, or patient preference. Prim Dressing: Hydrofera Blue Ready Transfer Foam, 2.5x2.5 (in/in) (DME) (Generic) 1 x Per Day/30 Days ary Discharge Instructions: Apply Hydrofera Blue Ready to wound bed as directed Secondary Dressing: ABD Pad 5x9 (in/in) 1 x Per Day/30 Days Discharge Instructions: Cover with ABD pad Secured With: [employer]  Roll Sterile or Non-Sterile 6-ply 4.5x4 (yd/yd) 1 x Per Day/30 Days Discharge Instructions: Apply Kerlix as directed WOUND #2: - Foot Wound Laterality: Right,  Lateral Cleanser: [employer]ram Ancillary Kit - 15 Day Supply (DME) (Generic) 1 x Per Day/30 Days Discharge Instructions: Use supplies as instructed; Kit contains: (15) Saline Bullets; (15) 3x3 Gauze; 15 pr Gloves Cleanser: Soap and Water 1 x Per Day/30 Days Discharge Instructions: Gently cleanse wound with antibacterial soap, rinse and pat dry prior to dressing wounds Peri-Wound Care: Moisturizing Lotion 1 x Per Day/30 Days Discharge Instructions: Suggestions: Theraderm, Eucerin, Cetaphil, or patient preference. Prim Dressing: Hydrofera Blue Ready Transfer Foam, 2.5x2.5 (in/in) (DME) (Generic) 1 x Per Day/30 Days ary Discharge Instructions: Apply Hydrofera Blue Ready to wound bed as directed Secondary Dressing: ABD Pad 5x9 (in/in) 1 x Per Day/30 Days Discharge Instructions: Cover with ABD pad Secured With: Kerlix Roll Sterile or Non-Sterile 6-ply 4.5x4 (yd/yd) 1 x Per Day/30 Days Discharge Instructions: Apply Kerlix as directed Jennifer [name], Jennifer [name] ([id number]) [id number] Page 8 of 10 1. Hydrofera Blue ABD and a surgical shoe 2. She does not have a macrovascular issue 3. She tells [name] she is going to make every effort to keep the pressure off this area if she does this this should heal over in 2 weeks or so Electronic Signature(s) Signed: 03/24/2022 4:44:08 PM By: Linton Ham MD Entered By: Linton Ham on 03/24/2022 12:29:32 -------------------------------------------------------------------------------- ROS/PFSH Details Patient Name: Date of Service: Jennifer [name] 03/24/2022 10:30 A M Medical Record Number: [id number] Patient Account Number: [id number] Date of Birth/Sex: Treating RN: June 30, 1950 (72 y.o. Jennifer [name] Primary Care Provider: [name] Other Clinician: Referring Provider: Treating Provider/Extender: RO BSO Delane Ginger, [name], [name] in Treatment: 0 Information Obtained From Patient Constitutional Symptoms (General  Health) Complaints and Symptoms: Negative for: Fatigue; Fever; Chills; Marked Weight Change Eyes Complaints and Symptoms: Positive for: Glasses / Contacts Negative for: Dry Eyes; Vision Changes Jennifer [name], Jennifer [name] ([id number]) [id number] Page 9 of 10 Medical History: Positive for: Cataracts - surgery Ear/Nose/Mouth/Throat Complaints and Symptoms: Negative for: Difficult clearing ears; Sinusitis Hematologic/Lymphatic Complaints and Symptoms: Negative for: Bleeding / Clotting Disorders; Human Immunodeficiency Virus Respiratory Complaints and Symptoms: Negative for: Chronic or frequent coughs; Shortness of Breath Genitourinary Complaints and Symptoms: Negative for: Kidney failure/ Dialysis; Incontinence/dribbling Immunological Complaints and Symptoms: Negative for: Hives; Itching Integumentary (Skin) Complaints and Symptoms: Positive for: Wounds Medical History: Positive for: History of pressure wounds Negative for: History of Burn Musculoskeletal Complaints and Symptoms: Negative for: Muscle Pain; Muscle Weakness Cardiovascular Medical History: Positive for: Hypertension Endocrine Medical History: Negative for: Type I Diabetes; Type II Diabetes Neurologic Medical History: Positive for: Neuropathy HBO Extended History Items Eyes: Cataracts Immunizations Pneumococcal Vaccine: Received Pneumococcal Vaccination: Yes Received Pneumococcal Vaccination On or After 60th Birthday: Yes Implantable Devices No devices added Family and Social History Never smoker; Marital Status - Married; Alcohol Use: Moderate; Drug Use: No History; Caffeine Use: Never Electronic Signature(s) Signed: 03/24/2022 4:44:08 PM By: Linton Ham MD Signed: 03/24/2022 5:13:55 PM By: Rosalio Loud MSN RN CNS [address], [name] [phone]: Rosalio Loud MSN RN CNS Lissa Morales [phone].pdf Page 10 of 10 Signed: 03/24/2022 5:13:55 PM Entered By: Rosalio Loud on 03/24/2022 10:47:46 -------------------------------------------------------------------------------- SuperBill Details Patient Name: Date of Service: Jennifer [name] 03/24/2022 Medical Record Number: [id number] Patient Account Number: [id number] Date of Birth/Sex: Treating RN: Jul 24, 1950 (72 y.o. Jennifer [name] Primary Care Provider: [name] Other Clinician: Referring Provider: Treating Provider/Extender: RO BSO N, [place],  Teressa Senter in Treatment: 0 Diagnosis Coding ICD-10 Codes Code Description G90.09 Other idiopathic peripheral autonomic neuropathy L97.528 Non-pressure chronic ulcer of other part of left foot with other specified severity Facility Procedures : CPT4 Code: [id number] Description: [id number] - DEB SUBQ TISSUE 20 SQ CM/< ICD-10 Diagnosis Description G90.09 Other idiopathic peripheral autonomic neuropathy L97.528 Non-pressure chronic ulcer of other part of left foot with other specified seve Modifier: rity Quantity: 1 Physician Procedures : CPT4 Code Description Modifier [id number] WC PHYS LEVEL 3 NEW PT 25 ICD-10 Diagnosis Description G90.09 Other idiopathic peripheral autonomic neuropathy L97.528 Non-pressure chronic ulcer of other part of left foot with other specified severity Quantity: 1 : [id number] - WC PHYS SUBQ TISS 20 SQ CM ICD-10 Diagnosis Description G90.09 Other idiopathic peripheral autonomic neuropathy L97.528 Non-pressure chronic ulcer of other part of left foot with other specified severity Quantity: 1 Electronic Signature(s) Signed: 03/24/2022 4:44:08 PM By: Linton Ham MD Entered By: Linton Ham on 03/24/2022 12:30:09

## 2022-03-26 NOTE — Progress Notes (Addendum)
IVANI, LUNDEEN ([id number]) [id number] Page 1 of 12 Visit Report for 03/24/2022 Allergy List Details Patient Name: Date of Service: Jennifer [name] 03/24/2022 10:30 A M Medical Record Number: [id number] Patient Account Number: [id number] Date of Birth/Sex: Treating RN: 04-22-1950 (72 y.o. [name] Primary Care Abel Ra: [name] Other Clinician: Referring Zykira Matlack: Treating Maylynn Orzechowski/Extender: RO BSO Delane Ginger, [name], [name] in Treatment: 0 Allergies Active Allergies No Known Allergies Allergy Notes Electronic Signature(s) Signed: 03/24/2022 5:13:55 PM By: Rosalio Loud MSN RN CNS WTA Entered By: Rosalio Loud on 03/24/2022 10:43:26 -------------------------------------------------------------------------------- Arrival Information Details Patient Name: Date of Service: Jennifer [name] [facility] 03/24/2022 10:30 A M Medical Record Number: [id number] Patient Account Number: [id number] Date of Birth/Sex: Treating RN: 07-15-1950 (72 y.o. [name] Primary Care Ambrosio Reuter: [name] Other Clinician: Referring Manasseh Pittsley: Treating Thaddius Manes/Extender: RO BSO Delane Ginger, [name] in Treatment: 0 Visit Information Patient Arrived: Ambulatory Arrival Time: 10:37 Accompanied By: self Transfer Assistance: None Patient Identification Verified: Yes Secondary Verification Process Completed: Yes Patient Requires Transmission-Based Precautions: No Patient Has Alerts: Yes Patient Alerts: Not diabetic Electronic Signature(s) Signed: 03/24/2022 5:13:55 PM By: Rosalio Loud MSN RN CNS WTA Entered By: Rosalio Loud on 03/24/2022 10:38:46 [name] ([id number] Page 2 of 12 -------------------------------------------------------------------------------- Clinic Level of Care Assessment Details Patient Name: Date of Service: Jennifer [name] 03/24/2022 10:30 A M Medical Record Number:  [id number] Patient Account Number: [id number] Date of Birth/Sex: Treating RN: 16-Jul-1950 (71 y.o. [name] Primary Care Zekiel Torian: [name] Other Clinician: Referring Gao Mitnick: Treating Orlandus Borowski/Extender: RO BSO Delane Ginger, [place], [name] in Treatment: 0 Clinic Level of Care Assessment Items TOOL 1 Quantity Score X- 1 0 Use when EandM and Procedure is performed on INITIAL visit ASSESSMENTS - Nursing Assessment / Reassessment X- 1 20 General Physical Exam (combine w/ comprehensive assessment (listed just below) when performed on new pt. evals) X- 1 25 Comprehensive Assessment (HX, ROS, Risk Assessments, Wounds Hx, etc.) ASSESSMENTS - Wound and Skin Assessment / Reassessment '[]'$  - 0 Dermatologic / Skin Assessment (not related to wound area) ASSESSMENTS - Ostomy and/or Continence Assessment and Care '[]'$  - 0 Incontinence Assessment and Management '[]'$  - 0 Ostomy Care Assessment and Management (repouching, etc.) PROCESS - Coordination of Care '[]'$  - 0 Simple Patient / Family Education for ongoing care X- 1 20 Complex (extensive) Patient / Family Education for ongoing care X- 1 10 Staff obtains Programmer, systems, Records, T Results / Process Orders est '[]'$  - 0 Staff telephones HHA, Nursing Homes / Clarify orders / etc '[]'$  - 0 Routine Transfer to another Facility (non-emergent condition) '[]'$  - 0 Routine Hospital Admission (non-emergent condition) X- 1 15 New Admissions / Biomedical engineer / Ordering NPWT Apligraf, etc. , '[]'$  - 0 Emergency Hospital Admission (emergent condition) PROCESS - Special Needs '[]'$  - 0 Pediatric / Minor Patient Management '[]'$  - 0 Isolation Patient Management '[]'$  - 0 Hearing / Language / Visual special needs '[]'$  - 0 Assessment of Community assistance (transportation, D/C planning, etc.) '[]'$  - 0 Additional assistance / Altered mentation '[]'$  - 0 Support Surface(s) Assessment (bed, cushion, seat, etc.) INTERVENTIONS - Miscellaneous '[]'$  -  0 External ear exam '[]'$  - 0 Patient Transfer (multiple staff / Civil Service fast streamer / Similar devices) '[]'$  - 0 Simple Staple / Suture removal (25 or less) '[]'$  - 0 Complex Staple / Suture removal (26 or more) Jennifer [name], Jennifer [name] ([id number]) [phone].pdf Page 3 of 12 '[]'$  - 0 Hypo/Hyperglycemic Management (do not  check if billed separately) X- 1 15 Ankle / Brachial Index (ABI) - do not check if billed separately Has the patient been seen at the hospital within the last three years: Yes Total Score: 105 Level Of Care: New/Established - Level 3 Electronic Signature(s) Signed: 03/29/2022 5:23:46 PM By: Rosalio Loud MSN RN CNS WTA Previous Signature: 03/24/2022 5:13:55 PM Version By: Rosalio Loud MSN RN CNS WTA Entered By: Rosalio Loud on 03/29/2022 11:15:06 -------------------------------------------------------------------------------- Encounter Discharge Information Details Patient Name: Date of Service: Jennifer [name] [facility] 03/24/2022 10:30 A M Medical Record Number: [id number] Patient Account Number: [id number] Date of Birth/Sex: Treating RN: [birth year] (72 y.o. [name] Primary Care Nazarene Bunning: [name] Other Clinician: Referring Lesli Issa: Treating Karma Ansley/Extender: RO BSO N, [name], [name] in Treatment: 0 Encounter Discharge Information Items Post Procedure Vitals Discharge Condition: Stable Temperature (F): 98.0 Ambulatory Status: Ambulatory Pulse (bpm): 62 Discharge Destination: Home Respiratory Rate (breaths/min): 16 Transportation: Private Auto Blood Pressure (mmHg): 122/70 Accompanied By: self Schedule Follow-up Appointment: Yes Clinical Summary of Care: Electronic Signature(s) Signed: 03/29/2022 11:18:14 AM By: Rosalio Loud MSN RN CNS WTA Previous Signature: 03/24/2022 5:13:55 PM Version By: Rosalio Loud MSN RN CNS WTA Entered By: Rosalio Loud on 03/29/2022  11:18:14 -------------------------------------------------------------------------------- Lower Extremity Assessment Details Patient Name: Date of Service: Jennifer [name] [facility] 03/24/2022 10:30 A M Medical Record Number: [id number] Patient Account Number: [id number] Date of Birth/Sex: Treating RN: [birth year] (72 y.o. [name] Primary Care Coree Brame: [name] Other Clinician: Referring Gisele Pack: Treating Charlese Gruetzmacher/Extender: RO BSO Delane Ginger, [name], [name] in Treatment: 0 Edema Assessment Assessed: [Left: No] Patrice Paradise: Yes] [Left: Edema] Patrice Paradise: :] T[LeftTrey Paula [id number] [[id number].pdf Page 4 of 12] Calf Left: Right: Point of Measurement: 33 cm From Medial Instep 40.3 cm Ankle Left: Right: Point of Measurement: 12 cm From Medial Instep 26.5 cm Vascular Assessment Pulses: Dorsalis Pedis Palpable: [Right:Yes] Electronic Signature(s) Signed: 03/25/2022 1:04:01 PM By: Rosalio Loud MSN RN CNS WTA Previous Signature: 03/24/2022 5:13:55 PM Version By: Rosalio Loud MSN RN CNS WTA Entered By: Rosalio Loud on 03/25/2022 13:04:01 -------------------------------------------------------------------------------- Multi Wound Chart Details Patient Name: Date of Service: Jennifer [name] [facility] 03/24/2022 10:30 A M Medical Record Number: [id number] Patient Account Number: [id number] Date of Birth/Sex: Treating RN: Jan 09, 1951 (72 y.o. [name] Primary Care Angela Platner: [name] Other Clinician: Referring Armida Vickroy: Treating Shawniece Oyola/Extender: RO BSO N, [name], [name] in Treatment: 0 Vital Signs Height(in): 61 Pulse(bpm): 62 Weight(lbs): 185 Blood Pressure(mmHg): 120/70 Body Mass Index(BMI): 35 Temperature(F): 98.0 Respiratory Rate(breaths/min): 16 [1:Photos:] [N/A:N/A] Right, Posterior Calcaneus Right, Lateral Foot N/A Wound Location: Pressure Injury Pressure Injury N/A Wounding Event: Pressure  Ulcer Pressure Ulcer N/A Primary Etiology: Cataracts, Hypertension, History of Cataracts, Hypertension, History of N/A Comorbid History: pressure wounds, Neuropathy pressure wounds, Neuropathy 01/13/2022 01/13/2022 N/A Date Acquired: 0 0 N/A Weeks of Treatment: Open Open N/A Wound Status: No No N/A Wound Recurrence: 1x0.6x0.1 0.5x0.7x0.1 N/A Measurements L x W x D (cm) 0.471 0.275 N/A A (cm) : rea 0.047 0.027 N/A Volume (cm) : Category/Stage II Category/Stage II N/A ClassificationKADYNCE, Jennifer [name] ([id number]) [phone].pdf Page 5 of 12 Medium Medium N/A Exudate Amount: Serosanguineous Serosanguineous N/A Exudate Type: red, brown red, brown N/A Exudate Color: Small (1-33%) Small (1-33%) N/A Granulation Amount: Red, Pink Red N/A Granulation Quality: None Present (0%) None Present (0%) N/A Necrotic Amount: Fat Layer (Subcutaneous Tissue): Yes Fascia: No N/A Exposed Structures: Fascia: No Fat Layer (Subcutaneous Tissue): No Tendon: No  Tendon: No Muscle: No Muscle: No Joint: No Joint: No Bone: No Bone: No Small (1-33%) None N/A Epithelialization: Debridement - Excisional Debridement - Excisional N/A Debridement: Subcutaneous, Slough Subcutaneous, Slough N/A Tissue Debrided: Skin/Subcutaneous Tissue Skin/Subcutaneous Tissue N/A Level: 0.6 0.35 N/A Debridement A (sq cm): rea Curette Curette N/A Instrument: Moderate Moderate N/A Bleeding: Silver Nitrate Silver Nitrate N/A Hemostasis A chieved: Debridement Treatment Response: Procedure was tolerated well Procedure was tolerated well N/A Post Debridement Measurements L x 1x0.5x0.2 0.5x0.7x0.2 N/A W x D (cm) 0.079 0.055 N/A Post Debridement Volume: (cm) Category/Stage II Category/Stage II N/A Post Debridement Stage: Debridement Debridement N/A Procedures Performed: Treatment Notes Wound #1 (Calcaneus) Wound Laterality: Right, Posterior Cleanser Soap and Water Discharge  Instruction: Gently cleanse wound with antibacterial soap, rinse and pat dry prior to dressing wounds Peri-Wound Care Moisturizing Lotion Discharge Instruction: Suggestions: Theraderm, Eucerin, Cetaphil, or patient preference. Topical Primary Dressing Hydrofera Blue Ready Transfer Foam, 2.5x2.5 (in/in) Discharge Instruction: Apply Hydrofera Blue Ready to wound bed as directed Secondary Dressing ABD Pad 5x9 (in/in) Discharge Instruction: Cover with ABD pad Secured With Kerlix Roll Sterile or Non-Sterile 6-ply 4.5x4 (yd/yd) Discharge Instruction: Apply Kerlix as directed Compression Wrap Compression Stockings Add-Ons Wound #2 (Foot) Wound Laterality: Right, Lateral Cleanser Byram Ancillary Kit - 15 Day Supply Discharge Instruction: Use supplies as instructed; Kit contains: (15) Saline Bullets; (15) 3x3 Gauze; 15 pr Gloves Soap and Water Discharge Instruction: Gently cleanse wound with antibacterial soap, rinse and pat dry prior to dressing wounds [place] Discharge Instruction: Suggestions: Theraderm, Eucerin, Cetaphil, or patient preference. Topical Primary Dressing Hydrofera Blue Ready Transfer Foam, 2.5x2.5 (in/in) Discharge Instruction: Apply Hydrofera Blue Ready to wound bed as directed Secondary Dressing Jennifer [name], Jennifer [name] ([id number]) [id number] Page 6 of 12 ABD Pad 5x9 (in/in) Discharge Instruction: Cover with ABD pad Secured With Kerlix Roll Sterile or Non-Sterile 6-ply 4.5x4 (yd/yd) Discharge Instruction: Apply Kerlix as directed Compression Wrap Compression Stockings Add-Ons Electronic Signature(s) Signed: 03/24/2022 4:44:08 PM By: Linton Ham MD Entered By: Linton Ham on 03/24/2022 12:18:33 -------------------------------------------------------------------------------- [place] Details Patient Name: Date of Service: Jennifer [name] [facility] 03/24/2022 10:30 A M Medical Record Number:  [id number] Patient Account Number: [id number] Date of Birth/Sex: Treating RN: 29-Jun-1950 (72 y.o. [name] Primary Care Floraine Buechler: [name] Other Clinician: Referring Tinya Cadogan: Treating Camary Sosa/Extender: RO BSO N, [name], [name] in Treatment: 0 Active Inactive Necrotic Tissue Nursing Diagnoses: Impaired tissue integrity related to necrotic/devitalized tissue Knowledge deficit related to management of necrotic/devitalized tissue Goals: Necrotic/devitalized tissue will be minimized in the wound bed Date Initiated: 03/29/2022 Target Resolution Date: 04/29/2022 Goal Status: Active Patient/caregiver will verbalize understanding of reason and process for debridement of necrotic tissue Date Initiated: 03/29/2022 Target Resolution Date: 04/29/2022 Goal Status: Active Interventions: Assess patient pain level pre-, during and post procedure and prior to discharge Provide education on necrotic tissue and debridement process Treatment Activities: Excisional debridement : 03/24/2022 Notes: Orientation to the Wound Care Program Nursing Diagnoses: Knowledge deficit related to the wound healing center program Goals: Patient/caregiver will verbalize understanding of the [place] Date Initiated: 03/29/2022 Target Resolution Date: 04/15/2022 Goal Status: Active Jennifer [name], Jennifer [name] ([id number]) [phone].pdf Page 7 of 12 Interventions: Provide education on orientation to the wound center Notes: Wound/Skin Impairment Nursing Diagnoses: Impaired tissue integrity Knowledge deficit related to ulceration/compromised skin integrity Goals: Patient will demonstrate a reduced rate of smoking or cessation of smoking Date Initiated: 03/29/2022 Target Resolution Date: 04/28/2022 Goal Status: Active Patient will have a decrease  in wound volume by X% from date: (specify in notes) Date Initiated: 03/29/2022 Target Resolution Date:  04/28/2022 Goal Status: Active Patient/caregiver will verbalize understanding of skin care regimen Date Initiated: 03/29/2022 Target Resolution Date: 04/28/2022 Goal Status: Active Ulcer/skin breakdown will have a volume reduction of 30% by week 4 Date Initiated: 03/29/2022 Target Resolution Date: 04/28/2022 Goal Status: Active Ulcer/skin breakdown will have a volume reduction of 50% by week 8 Date Initiated: 03/29/2022 Target Resolution Date: 05/28/2022 Goal Status: Active Ulcer/skin breakdown will have a volume reduction of 80% by week 12 Date Initiated: 03/29/2022 Target Resolution Date: 06/28/2022 Goal Status: Active Ulcer/skin breakdown will heal within 14 weeks Date Initiated: 03/29/2022 Target Resolution Date: 07/12/2022 Goal Status: Active Interventions: Assess patient/caregiver ability to obtain necessary supplies Assess patient/caregiver ability to perform ulcer/skin care regimen upon admission and as needed Assess ulceration(s) every visit Provide education on ulcer and skin care Treatment Activities: Skin care regimen initiated : 03/24/2022 Notes: Electronic Signature(s) Signed: 03/29/2022 11:17:38 AM By: Rosalio Loud MSN RN CNS WTA Entered By: Rosalio Loud on 03/29/2022 11:17:38 -------------------------------------------------------------------------------- Pain Assessment Details Patient Name: Date of Service: Jennifer [name] 03/24/2022 10:30 A M Medical Record Number: [id number] Patient Account Number: [id number] Date of Birth/Sex: Treating RN: 06-25-1950 (72 y.o. [name] Primary Care Chealsey Miyamoto: [name] Other Clinician: Referring Macdonald Rigor: Treating Hibah Odonnell/Extender: Eldridge Dace, [name] in Treatment: 0 Active Problems Location of Pain Severity and Description of Pain Jennifer [name], Jennifer [name] ([id number]) [id number] Page 8 of 12 Patient Has Paino Yes Site Locations Duration of the Pain. Constant /  Intermittento Intermittent Rate the pain. Current Pain Level: 0 Worst Pain Level: 8 Least Pain Level: 0 Tolerable Pain Level: 3 Character of Pain Describe the Pain: Tender Pain Management and Medication Current Pain Management: Electronic Signature(s) Signed: 03/24/2022 5:13:55 PM By: Rosalio Loud MSN RN CNS WTA Entered By: Rosalio Loud on 03/24/2022 10:39:41 -------------------------------------------------------------------------------- Patient/Caregiver Education Details Patient Name: Date of Service: Jennifer [name] 3/7/2024andnbsp10:30 A M Medical Record Number: [id number] Patient Account Number: [id number] Date of Birth/Gender: Treating RN: [birth year] (72 y.o. [name] Primary Care Physician: [name] Other Clinician: Referring Physician: Treating Physician/Extender: RO BSO Delane Ginger, [name] in Treatment: 0 Education Assessment Education Provided To: Patient Education Topics Provided Wound Debridement: Handouts: Wound Debridement Methods: Explain/Verbal Responses: State content correctly Wound/Skin Impairment: Handouts: Caring for Your Ulcer Methods: Explain/Verbal Responses: State content correctly Electronic Signature(s) Signed: 03/29/2022 5:23:46 PM By: Rosalio Loud MSN RN CNS Micheline Rough, [name] ([id number]) By: Rosalio Loud MSN RN CNS Lissa Morales [phone].pdf Page 9 of 12 Signed: 03/29/2022 5:23:46 PM Previous Signature: 03/24/2022 5:13:55 PM Version By: Rosalio Loud MSN RN CNS WTA Entered By: Rosalio Loud on 03/29/2022 11:17:46 -------------------------------------------------------------------------------- Wound Assessment Details Patient Name: Date of Service: Jennifer [name] 03/24/2022 10:30 A M Medical Record Number: [id number] Patient Account Number: [id number] Date of Birth/Sex: Treating RN: Mar 12, 1950 (72 y.o. [name] Primary Care Marylon Verno: [name] Other Clinician: Referring  Jhoanna Heyde: Treating Alvira Hecht/Extender: RO BSO N, [name], [name] in Treatment: 0 Wound Status Wound Number: 1 Primary Pressure Ulcer Etiology: Wound Location: Right, Posterior Calcaneus Wound Status: Open Wounding Event: Pressure Injury Comorbid Cataracts, Hypertension, History of pressure wounds, Date Acquired: 01/13/2022 History: Neuropathy Weeks Of Treatment: 0 Clustered Wound: No Photos Wound Measurements Length: (cm) 1 Width: (cm) 0.6 Depth: (cm) 0.1 Area: (cm) 0.471 Volume: (cm) 0.047 % Reduction in Area: % Reduction in Volume: Epithelialization: Small (1-33%)  Wound Description Classification: Category/Stage III Exudate Amount: Medium Exudate Type: Serosanguineous Exudate Color: red, brown Foul Odor After Cleansing: No Slough/Fibrino No Wound Bed Granulation Amount: Small (1-33%) Exposed Structure Granulation Quality: Red, Pink Fascia Exposed: No Necrotic Amount: None Present (0%) Fat Layer (Subcutaneous Tissue) Exposed: Yes Tendon Exposed: No Muscle Exposed: No Joint Exposed: No Bone Exposed: No Treatment Notes Wound #1 (Calcaneus) Wound Laterality: Right, Posterior Jennifer [name], Jennifer [name] ([id number]) [phone].pdf Page 10 of 12 Cleanser Soap and Water Discharge Instruction: Gently cleanse wound with antibacterial soap, rinse and pat dry prior to dressing wounds [place] Discharge Instruction: Suggestions: Theraderm, Eucerin, Cetaphil, or patient preference. Topical Primary Dressing Hydrofera Blue Ready Transfer Foam, 2.5x2.5 (in/in) Discharge Instruction: Apply Hydrofera Blue Ready to wound bed as directed Secondary Dressing ABD Pad 5x9 (in/in) Discharge Instruction: Cover with ABD pad Secured With Kerlix Roll Sterile or Non-Sterile 6-ply 4.5x4 (yd/yd) Discharge Instruction: Apply Kerlix as directed Compression Wrap Compression Stockings Add-Ons Electronic Signature(s) Signed:  03/25/2022 1:02:52 PM By: Rosalio Loud MSN RN CNS WTA Previous Signature: 03/25/2022 1:01:48 PM Version By: Rosalio Loud MSN RN CNS WTA Previous Signature: 03/24/2022 5:13:55 PM Version By: Rosalio Loud MSN RN CNS WTA Entered By: Rosalio Loud on 03/25/2022 13:02:52 -------------------------------------------------------------------------------- Wound Assessment Details Patient Name: Date of Service: Jennifer [name] 03/24/2022 10:30 A M Medical Record Number: [id number] Patient Account Number: [id number] Date of Birth/Sex: Treating RN: March 31, 1950 (72 y.o. [name] Primary Care Keefe Zawistowski: [name] Other Clinician: Referring Mikalyn Hermida: Treating Cinnamon Morency/Extender: RO BSO N, [name], [name] in Treatment: 0 Wound Status Wound Number: 2 Primary Pressure Ulcer Etiology: Wound Location: Right, Lateral Foot Wound Status: Open Wounding Event: Pressure Injury Comorbid Cataracts, Hypertension, History of pressure wounds, Date Acquired: 01/13/2022 History: Neuropathy Weeks Of Treatment: 0 Clustered Wound: No Photos Jennifer [name], Jennifer [name] ([id number]) [id number] Page 11 of 12 Wound Measurements Length: (cm) 0.5 Width: (cm) 0.7 Depth: (cm) 0.1 Area: (cm) 0.275 Volume: (cm) 0.027 % Reduction in Area: % Reduction in Volume: Epithelialization: None Wound Description Classification: Category/Stage II Exudate Amount: Medium Exudate Type: Serosanguineous Exudate Color: red, brown Foul Odor After Cleansing: No Slough/Fibrino No Wound Bed Granulation Amount: Small (1-33%) Exposed Structure Granulation Quality: Red Fascia Exposed: No Necrotic Amount: None Present (0%) Fat Layer (Subcutaneous Tissue) Exposed: No Tendon Exposed: No Muscle Exposed: No Joint Exposed: No Bone Exposed: No Treatment Notes Wound #2 (Foot) Wound Laterality: Right, Lateral Cleanser Byram Ancillary Kit - 15 Day Supply Discharge Instruction: Use supplies as  instructed; Kit contains: (15) Saline Bullets; (15) 3x3 Gauze; 15 pr Gloves Soap and Water Discharge Instruction: Gently cleanse wound with antibacterial soap, rinse and pat dry prior to dressing wounds [place] Discharge Instruction: Suggestions: Theraderm, Eucerin, Cetaphil, or patient preference. Topical Primary Dressing Hydrofera Blue Ready Transfer Foam, 2.5x2.5 (in/in) Discharge Instruction: Apply Hydrofera Blue Ready to wound bed as directed Secondary Dressing ABD Pad 5x9 (in/in) Discharge Instruction: Cover with ABD pad Secured With Kerlix Roll Sterile or Non-Sterile 6-ply 4.5x4 (yd/yd) Discharge Instruction: Apply Kerlix as directed Compression Wrap Compression Stockings Add-Ons Electronic Signature(s) Signed: 03/25/2022 1:02:31 PM By: Rosalio Loud MSN RN CNS WTA Previous Signature: 03/24/2022 5:13:55 PM Version By: Rosalio Loud MSN RN CNS WTA Entered By: Rosalio Loud on 03/25/2022 13:02:31 [name] ([id number] Page 12 of 12 -------------------------------------------------------------------------------- Vitals Details Patient Name: Date of Service: Jennifer [name] 03/24/2022 10:30 A M Medical Record Number: [id number] Patient Account Number: [id number] Date of Birth/Sex: Treating RN: 1951/01/02 (72 y.o. F)  Rosalio Loud Primary Care Shivaun Bilello: [name] Other Clinician: Referring Murel Shenberger: Treating Kristol Almanzar/Extender: RO BSO N, [name], [name] in Treatment: 0 Vital Signs Time Taken: 10:39 Temperature (F): 98.0 Height (in): 61 Pulse (bpm): 62 Source: Stated Respiratory Rate (breaths/min): 16 Weight (lbs): 185 Blood Pressure (mmHg): 120/70 Source: Stated Reference Range: 80 - 120 mg / dl Body Mass Index (BMI): 35 Electronic Signature(s) Signed: 03/24/2022 5:13:55 PM By: Rosalio Loud MSN RN CNS WTA Entered By: Rosalio Loud on 03/24/2022 10:43:07

## 2022-03-26 NOTE — Progress Notes (Signed)
Jennifer [name] ([id number]) [id number] Page 1 of 5 Visit Report for 03/24/2022 Abuse Risk Screen Details Patient Name: Date of Service: Jennifer [name] 03/24/2022 10:30 A M Medical Record Number: [id number] Patient Account Number: [id number] Date of Birth/Sex: Treating RN: [birth year] (72 y.o. Jennifer [name] Primary Care Jennifer [name]: Jennifer [name] Other Clinician: Referring Jennifer [name]: Treating Jennifer [name]/Extender: RO BSO Jennifer [name], Jennifer [name], Jennifer [name] in Treatment: 0 Abuse Risk Screen Items Answer Electronic Signature(s) Signed: 03/24/2022 5:13:55 PM By: Rosalio Loud MSN RN CNS WTA Entered By: Rosalio Loud on 03/24/2022 10:47:50 -------------------------------------------------------------------------------- Activities of Daily Living Details Patient Name: Date of Service: Jennifer [name] 03/24/2022 10:30 A M Medical Record Number: [id number] Patient Account Number: [id number] Date of Birth/Sex: Treating RN: April 27, 1950 (72 y.o. Jennifer [name] Primary Care Lacrystal Barbe: Jennifer [name] Other Clinician: Referring Ahmaad Neidhardt: Treating [name]/Extender: RO BSO Jennifer [name], [place] EL Nyra [name], Jennifer [name] in Treatment: 0 Activities of Daily Living Items Answer Activities of Daily Living (Please select one for each item) Drive Automobile Completely Able T Medications ake Completely Able Use T elephone Completely Able Care for Appearance Completely Able Use T oilet Completely Able Bath / Shower Completely Able Dress Self Completely Able Feed Self Completely Able Walk Completely Able Get In / Out Bed Completely Able Housework Completely Able Prepare Meals Completely Able Handle Money Completely Able Shop for Self Completely [facility], [facility] ([id number]) [phone] [id number] Page 2 of 5 Electronic Signature(s) Signed: 03/24/2022 5:13:55 PM By: Rosalio Loud MSN RN CNS WTA Entered By: Rosalio Loud on 03/24/2022  10:48:05 -------------------------------------------------------------------------------- Education Screening Details Patient Name: Date of Service: Jennifer [name] 03/24/2022 10:30 A M Medical Record Number: [id number] Patient Account Number: [id number] Date of Birth/Sex: Treating RN: 12/30/1950 (72 y.o. Jennifer [name] Primary Care Latrise Bowland: Jennifer [name] Other Clinician: Referring Jennifer [name]: Treating Jennifer [name]/Extender: RO BSO Jennifer [name], Jennifer [name] in Treatment: 0 Learning Preferences/Education Level/Primary Language Learning Preference: Explanation Highest Education Level: College or Above Preferred Language: English Cognitive Barrier Language Barrier: No Translator Needed: No Memory Deficit: No Emotional Barrier: No Cultural/Religious Beliefs Affecting Medical Care: No Physical Barrier Impaired Vision: Yes Glasses Impaired Hearing: No Decreased Hand dexterity: No Knowledge/Comprehension Knowledge Level: High Comprehension Level: High Ability to understand written instructions: High Ability to understand verbal instructions: High Motivation Anxiety Level: Calm Cooperation: Cooperative Education Importance: Acknowledges Need Interest in Health Problems: Asks Questions Perception: Coherent Willingness to Engage in Self-Management High Activities: Readiness to Engage in Self-Management High Activities: Electronic Signature(s) Signed: 03/24/2022 5:13:55 PM By: Rosalio Loud MSN RN CNS WTA Entered By: Rosalio Loud on 03/24/2022 10:48:41 Jennifer [name] ([id number]) [id number] Page 3 of 5 -------------------------------------------------------------------------------- Fall Risk Assessment Details Patient Name: Date of Service: Jennifer [name] 03/24/2022 10:30 A M Medical Record Number: [id number] Patient Account Number: [id number] Date of Birth/Sex: Treating RN: 27-Mar-1950 (72 y.o. Jennifer [name] Primary Care  Jennifer [name]: Jennifer [name] Other Clinician: Referring Jennifer [name]: Treating Jennifer [name]: RO BSO Jennifer [name], Jennifer [name], Jennifer [name] in Treatment: 0 Fall Risk Assessment Items Have you had 2 or more falls in the last 12 monthso 0 Yes Have you had any fall that resulted in injury in the last 12 monthso 0 No FALLS RISK SCREEN History of falling - immediate or within 3 months 0 No Secondary diagnosis (Do you have 2 or more medical diagnoseso) 0 No Ambulatory aid None/bed rest/wheelchair/nurse 0 No Crutches/cane/walker 0 No Furniture 0 No Intravenous therapy Access/Saline/Heparin Lock 0 No Gait/Transferring Normal/ bed rest/ wheelchair 0  No Weak (short steps with or without shuffle, stooped but able to lift head while walking, may seek 0 No support from furniture) Impaired (short steps with shuffle, may have difficulty arising from chair, head down, impaired 0 No balance) Mental Status Oriented to own ability 0 Yes Electronic Signature(s) Signed: 03/24/2022 5:13:55 PM By: Rosalio Loud MSN RN CNS WTA Entered By: Rosalio Loud on 03/24/2022 10:49:28 -------------------------------------------------------------------------------- Foot Assessment Details Patient Name: Date of Service: Jennifer [name] 03/24/2022 10:30 A M Medical Record Number: [id number] Patient Account Number: [id number] Date of Birth/Sex: Treating RN: Jennifer [name] Primary Care Anthonymichael Munday: Jennifer [name] Other Clinician: Referring Sumedha Munnerlyn: Treating Aily Tzeng/Extender: Jennifer [name], Jennifer [name], Jennifer [name] in Treatment: 0 Foot Assessment Items Site Locations [name], [name] ([id number]) [id number] Page 4 of 5 + = Sensation present, - = Sensation absent, C = Callus, U = Ulcer R = Redness, W = Warmth, M = Maceration, PU = Pre-ulcerative lesion F = Fissure, S = Swelling, D = Dryness Assessment Right: Left: Other Deformity: No No Prior Foot Ulcer: Yes  No Prior Amputation: No No Charcot Joint: No No Ambulatory Status: Ambulatory Without Help Gait: Steady Electronic Signature(s) Signed: 03/24/2022 5:13:55 PM By: Rosalio Loud MSN RN CNS WTA Entered By: Rosalio Loud on 03/24/2022 10:51:18 -------------------------------------------------------------------------------- Nutrition Risk Screening Details Patient Name: Date of Service: Jennifer [name] 03/24/2022 10:30 A M Medical Record Number: [id number] Patient Account Number: [id number] Date of Birth/Sex: Treating RN: Feb 20, 1950 (72 y.o. Jennifer [name] Primary Care Mabry Tift: Jennifer [name] Other Clinician: Referring Devlin Brink: Treating Tyrik Stetzer/Extender: RO BSO N, [place], Jennifer [name] in Treatment: 0 Height (in): 61 Weight (lbs): 185 Body Mass Index (BMI): 35 Nutrition Risk Screening Items Score Screening NUTRITION RISK SCREEN: I have an illness or condition that made me change the kind and/or amount of food I eat 0 No I eat fewer than two meals per day 0 No I eat few fruits and vegetables, or milk products 0 No I have three or more drinks of beer, liquor or wine almost every day 0 No I have tooth or mouth problems that make it hard for me to eat 0 No I don't always have enough money to buy the food I need 0 No ELOISE, ASHER ([id number]) [phone] [id number] Page 5 of 5 I eat alone most of the time 0 No I take three or more different prescribed or over-the-counter drugs a day 0 No Without wanting to, I have lost or gained 10 pounds in the last six months 0 No I am not always physically able to shop, cook and/or feed myself 0 No Nutrition Protocols Good Risk Protocol 0 No interventions needed Moderate Risk Protocol High Risk Proctocol Risk Level: Good Risk Score: 0 Electronic Signature(s) Signed: 03/24/2022 5:13:55 PM By: Rosalio Loud MSN RN CNS WTA Entered By: Rosalio Loud on 03/24/2022 10:49:45

## 2022-03-30 NOTE — Telephone Encounter (Signed)
Patient was contacted and reminded to come in and have labs drawn and that she would see Dr. [name] on 05/09/2022 at 2:30 PM. Patient agreed on coming in next week on 04/06/2022. Labs are ordered.

## 2022-03-30 NOTE — Addendum Note (Signed)
Addended by: [name] on: 03/30/2022 02:04 PM   Modules accepted: Orders

## 2022-04-05 ENCOUNTER — Telehealth: Payer: Self-pay

## 2022-04-05 NOTE — Telephone Encounter (Signed)
LVM to see if pt has questions regarding her results although she has already viewed the results on 04/03/22

## 2022-04-06 NOTE — Telephone Encounter (Signed)
Pt returned [place] call. Unable to transfer. Pt will like to f/u.

## 2022-04-07 ENCOUNTER — Encounter: Payer: Medicare Other | Admitting: Physician Assistant

## 2022-04-07 DIAGNOSIS — L89613 Pressure ulcer of right heel, stage 3: Secondary | ICD-10-CM | POA: Diagnosis not present

## 2022-04-07 DIAGNOSIS — E7800 Pure hypercholesterolemia, unspecified: Secondary | ICD-10-CM | POA: Diagnosis not present

## 2022-04-07 DIAGNOSIS — G9009 Other idiopathic peripheral autonomic neuropathy: Secondary | ICD-10-CM | POA: Diagnosis not present

## 2022-04-07 DIAGNOSIS — G4730 Sleep apnea, unspecified: Secondary | ICD-10-CM | POA: Diagnosis not present

## 2022-04-07 DIAGNOSIS — I10 Essential (primary) hypertension: Secondary | ICD-10-CM | POA: Diagnosis not present

## 2022-04-07 DIAGNOSIS — L97528 Non-pressure chronic ulcer of other part of left foot with other specified severity: Secondary | ICD-10-CM | POA: Diagnosis not present

## 2022-04-07 DIAGNOSIS — G629 Polyneuropathy, unspecified: Secondary | ICD-10-CM | POA: Diagnosis not present

## 2022-04-07 NOTE — Telephone Encounter (Signed)
Spoke to pt and scheduled f/up appt in office

## 2022-04-08 ENCOUNTER — Telehealth: Payer: Self-pay | Admitting: Gastroenterology

## 2022-04-08 NOTE — Progress Notes (Addendum)
MERRILIE, RIDPATH ([id number]) [id number] Page 1 of 7 Visit Report for 04/07/2022 Chief Complaint Document Details Patient Name: Date of Service: Jennifer [name] 04/07/2022 1:15 PM Medical Record Number: [id number] Patient Account Number: [id number] Date of Birth/Sex: Treating RN: [birth year] (72 y.o. Jennifer [name] Primary Care Provider: [name] Other Clinician: Referring Provider: Treating Provider/Extender: Susa Day in Treatment: 2 Information Obtained from: Patient Chief Complaint 03/24/2022; patient is here for review of wounds on the 2 locations on the left lateral foot Electronic Signature(s) Signed: 04/07/2022 4:30:16 PM By: Rosalio Loud MSN RN CNS WTA Signed: 04/07/2022 5:06:45 PM By: Worthy Keeler PA-C Previous Signature: 04/07/2022 1:19:44 PM Version By: Worthy Keeler PA-C Entered By: Rosalio Loud on 04/07/2022 14:26:36 -------------------------------------------------------------------------------- Debridement Details Patient Name: Date of Service: Jennifer [name] 04/07/2022 1:15 PM Medical Record Number: [id number] Patient Account Number: [id number] Date of Birth/Sex: Treating RN: 08/24/1950 (72 y.o. Jennifer [name] Primary Care Provider: [name] Other Clinician: Referring Provider: Treating Provider/Extender: Susa Day in Treatment: 2 Debridement Performed for Assessment: Wound #1 Right,Posterior Calcaneus Performed By: Physician Tommie Sams., PA-C Debridement Type: Debridement Level of Consciousness (Pre-procedure): Awake and Alert Pre-procedure Verification/Time Out Yes - 13:56 Taken: Start Time: 13:56 T Area Debrided (L x W): otal 1 (cm) x 0.5 (cm) = 0.5 (cm) Tissue and other material debrided: Viable, Non-Viable, Callus, Slough, Subcutaneous, Slough Level: Skin/Subcutaneous Tissue Debridement Description: Excisional Instrument: Curette Bleeding:  Moderate Hemostasis Achieved: Silver Nitrate Response to Treatment: Procedure was tolerated well Level of Consciousness (Post- Awake and Alert procedure): Jennifer [name], Jennifer [name] ([id number]) [phone].pdf Page 2 of 7 Post Debridement Measurements of Total Wound Length: (cm) 1 Stage: Category/Stage III Width: (cm) 0.5 Depth: (cm) 0.2 Volume: (cm) 0.079 Character of Wound/Ulcer Post Debridement: Requires Further Debridement Post Procedure Diagnosis Same as Pre-procedure Electronic Signature(s) Signed: 04/07/2022 4:30:16 PM By: Rosalio Loud MSN RN CNS WTA Signed: 04/07/2022 5:06:45 PM By: Worthy Keeler PA-C Entered By: Rosalio Loud on 04/07/2022 14:00:37 -------------------------------------------------------------------------------- HPI Details Patient Name: Date of Service: Jennifer [name] 04/07/2022 1:15 PM Medical Record Number: [id number] Patient Account Number: [id number] Date of Birth/Sex: Treating RN: [birth year] (72 y.o. Jennifer [name] Primary Care Provider: [name] Other Clinician: Referring Provider: Treating Provider/Extender: Susa Day in Treatment: 2 History of Present Illness HPI Description: ADMISSION 03/24/2022 This is a 72 year old woman who has had 2 small open areas on the right lateral foot. She is not a diabetic but does have peripheral neuropathy. She was seen by [facility] yesterday. ABIs w She has not had previous wounds in this area. ABI 1.20 on the right and TBI at 1.02. On the left 1.23 and 1.00 on the left Past medical history; is not noted not a diabetic. She has gastroesophageal reflux disease, high cholesterol, hyperlipidemia, hypertension, hypothyroidism, sleep apnea 04-07-2022 upon evaluation today patient appears to be doing well currently in regard to her wound. The lateral portion of her foot appears to be completely healed which is great news and I am very pleased in that  regard. In regard to the medial portion of her foot and the heel location this is still open and does appear to need some sharp debridement today. I discussed with her today that we are going to proceed with debridement at this point. She is in agreement with that plan. Electronic Signature(s) Signed: 04/07/2022 2:22:52 PM By: Worthy Keeler PA-C Entered By: Worthy Keeler on 04/07/2022 14:22:52  SANDA, Jennifer [name] ([id number]) [id number] Page 3 of 7 -------------------------------------------------------------------------------- Physical Exam Details Patient Name: Date of Service: Jennifer [name] 04/07/2022 1:15 PM Medical Record Number: [id number] Patient Account Number: [id number] Date of Birth/Sex: Treating RN: Jun 07, 1950 (72 y.o. Jennifer [name] Primary Care Provider: [name] Other Clinician: Referring Provider: Treating Provider/Extender: Susa Day in Treatment: 2 Constitutional Well-nourished and well-hydrated in no acute distress. Respiratory normal breathing without difficulty. Psychiatric this patient is able to make decisions and demonstrates good insight into disease process. Alert and Oriented x 3. pleasant and cooperative. Notes Upon inspection patient's wound bed actually showed signs of good granulation and epithelization at this point. Fortunately I do not see any evidence of active infection locally nor systemically which is great news. I did perform debridement clearway some of the necrotic debris including slough and biofilm down to good subcutaneous tissue she tolerated that today without complication. Electronic Signature(s) Signed: 04/07/2022 2:23:14 PM By: Worthy Keeler PA-C Entered By: Worthy Keeler on 04/07/2022 14:23:14 -------------------------------------------------------------------------------- Physician Orders Details Patient Name: Date of Service: Jennifer [name] 04/07/2022 1:15  PM Medical Record Number: [id number] Patient Account Number: [id number] Date of Birth/Sex: Treating RN: 09-08-1950 (72 y.o. Jennifer [name] Primary Care Provider: [name] Other Clinician: Referring Provider: Treating Provider/Extender: Susa Day in Treatment: 2 Verbal / Phone Orders: No Diagnosis Coding ICD-10 Coding Code Description G90.09 Other idiopathic peripheral autonomic neuropathy L97.528 Non-pressure chronic ulcer of other part of left foot with other specified severity Follow-up Appointments Return Appointment in 1 week. Bathing/ [employer] wounds with antibacterial soap and water. Off-Loading Heel suspension boot Wound Treatment Wound #1 - Calcaneus Wound Laterality: Right, Posterior Cleanser: Soap and Water 1 x Per Day/30 Days Discharge Instructions: Gently cleanse wound with antibacterial soap, rinse and pat dry prior to dressing wounds Peri-Wound Care: Moisturizing Lotion 1 x Per Day/30 Days Discharge Instructions: Suggestions: Theraderm, Eucerin, Cetaphil, or patient preference. MBER, GROSHANS ([id number]) [id number] Page 4 of 7 Prim Dressing: Hydrofera Blue Ready Transfer Foam, 2.5x2.5 (in/in) (Generic) 1 x Per Day/30 Days ary Discharge Instructions: Apply Hydrofera Blue Ready to wound bed as directed Secondary Dressing: ABD Pad 5x9 (in/in) 1 x Per Day/30 Days Discharge Instructions: Cover with ABD pad Secured With: Kerlix Roll Sterile or Non-Sterile 6-ply 4.5x4 (yd/yd) 1 x Per Day/30 Days Discharge Instructions: Apply Kerlix as directed Electronic Signature(s) Signed: 04/07/2022 4:30:16 PM By: Rosalio Loud MSN RN CNS WTA Signed: 04/07/2022 5:06:45 PM By: Worthy Keeler PA-C Entered By: Rosalio Loud on 04/07/2022 14:25:59 -------------------------------------------------------------------------------- Problem List Details Patient Name: Date of Service: Jennifer [name] 04/07/2022 1:15  PM Medical Record Number: [id number] Patient Account Number: [id number] Date of Birth/Sex: Treating RN: [birth year] (72 y.o. Jennifer [name] Primary Care Provider: [name] Other Clinician: Referring Provider: Treating Provider/Extender: Susa Day in Treatment: 2 Active Problems ICD-10 Encounter Code Description Active Date MDM Diagnosis G90.09 Other idiopathic peripheral autonomic neuropathy 03/24/2022 No Yes L97.528 Non-pressure chronic ulcer of other part of left foot with other specified 03/24/2022 No Yes severity Inactive Problems Resolved Problems Electronic Signature(s) Signed: 04/07/2022 4:30:16 PM By: Rosalio Loud MSN RN CNS WTA Signed: 04/07/2022 5:06:45 PM By: Worthy Keeler PA-C Previous Signature: 04/07/2022 1:19:40 PM Version By: Worthy Keeler PA-C Entered By: Rosalio Loud on 04/07/2022 14:26:31 Jennifer [name] ([id number]) [id number] Page 5 of 7 -------------------------------------------------------------------------------- Progress Note Details Patient Name: Date of Service: Jennifer [name] 04/07/2022 1:15 PM Medical Record Number:  [id number] Patient Account Number: [id number] Date of Birth/Sex: Treating RN: Oct 20, 1950 (72 y.o. Jennifer [name] Primary Care Provider: [name] Other Clinician: Referring Provider: Treating Provider/Extender: Susa Day in Treatment: 2 Subjective Chief Complaint Information obtained from Patient 03/24/2022; patient is here for review of wounds on the 2 locations on the left lateral foot History of Present Illness (HPI) ADMISSION 03/24/2022 This is a 72 year old woman who has had 2 small open areas on the right lateral foot. She is not a diabetic but does have peripheral neuropathy. She was seen by [facility] yesterday. ABIs w She has not had previous wounds in this area. ABI 1.20 on the right and TBI at 1.02. On the left  1.23 and 1.00 on the left Past medical history; is not noted not a diabetic. She has gastroesophageal reflux disease, high cholesterol, hyperlipidemia, hypertension, hypothyroidism, sleep apnea 04-07-2022 upon evaluation today patient appears to be doing well currently in regard to her wound. The lateral portion of her foot appears to be completely healed which is great news and I am very pleased in that regard. In regard to the medial portion of her foot and the heel location this is still open and does appear to need some sharp debridement today. I discussed with her today that we are going to proceed with debridement at this point. She is in agreement with that plan. Objective Constitutional Well-nourished and well-hydrated in no acute distress. Vitals Time Taken: 1:25 PM, Height: 61 in, Weight: 185 lbs, BMI: 35, Temperature: 98.1 F, Pulse: 67 bpm, Respiratory Rate: 16 breaths/min, Blood Pressure: 136/72 mmHg. Respiratory normal breathing without difficulty. Psychiatric this patient is able to make decisions and demonstrates good insight into disease process. Alert and Oriented x 3. pleasant and cooperative. General Notes: Upon inspection patient's wound bed actually showed signs of good granulation and epithelization at this point. Fortunately I do not see any evidence of active infection locally nor systemically which is great news. I did perform debridement clearway some of the necrotic debris including slough and biofilm down to good subcutaneous tissue she tolerated that today without complication. Integumentary (Hair, Skin) Wound #1 status is Open. Original cause of wound was Pressure Injury. The date acquired was: 01/13/2022. The wound has been in treatment 2 weeks. The wound is located on the Right,Posterior Calcaneus. The wound measures 1cm length x 0.5cm width x 0.1cm depth; 0.393cm^2 area and 0.039cm^3 volume. There is Fat Layer (Subcutaneous Tissue) exposed. There is a medium  amount of serosanguineous drainage noted. There is small (1-33%) red, pink granulation within the wound bed. There is no necrotic tissue within the wound bed. Wound #2 status is Healed - Epithelialized. Original cause of wound was Pressure Injury. The date acquired was: 01/13/2022. The wound has been in treatment 2 weeks. The wound is located on the Right,Lateral Foot. The wound measures 0cm length x 0cm width x 0cm depth; 0cm^2 area and 0cm^3 volume. There is a medium amount of serosanguineous drainage noted. There is small (1-33%) red granulation within the wound bed. There is no necrotic tissue within the wound bed. Jennifer [name], Jennifer [name] ([id number]) [id number] Page 6 of 7 Assessment Active Problems ICD-10 Other idiopathic peripheral autonomic neuropathy Non-pressure chronic ulcer of other part of left foot with other specified severity Procedures Wound #1 Pre-procedure diagnosis of Wound #1 is a Pressure Ulcer located on the Right,Posterior Calcaneus . There was a Excisional Skin/Subcutaneous Tissue Debridement with a total area of 0.5 sq cm performed by Jeri Cos  E., PA-C. With the following instrument(s): Curette to remove Viable and Non-Viable tissue/material. Material removed includes Callus, Subcutaneous Tissue, and Slough. No specimens were taken. A time out was conducted at 13:56, prior to the start of the procedure. A Moderate amount of bleeding was controlled with Silver Nitrate. The procedure was tolerated well. Post Debridement Measurements: 1cm length x 0.5cm width x 0.2cm depth; 0.079cm^3 volume. Post debridement Stage noted as Category/Stage III. Character of Wound/Ulcer Post Debridement requires further debridement. Post procedure Diagnosis Wound #1: Same as Pre-Procedure Plan Follow-up Appointments: Return Appointment in 1 week. Bathing/ Shower/ Hygiene: Wash wounds with antibacterial soap and water. Off-Loading: Heel suspension boot WOUND  #1: - Calcaneus Wound Laterality: Right, Posterior Cleanser: Soap and Water 1 x Per Day/30 Days Discharge Instructions: Gently cleanse wound with antibacterial soap, rinse and pat dry prior to dressing wounds Peri-Wound Care: Moisturizing Lotion 1 x Per Day/30 Days Discharge Instructions: Suggestions: Theraderm, Eucerin, Cetaphil, or patient preference. Prim Dressing: Hydrofera Blue Ready Transfer Foam, 2.5x2.5 (in/in) (Generic) 1 x Per Day/30 Days ary Discharge Instructions: Apply Hydrofera Blue Ready to wound bed as directed Secondary Dressing: ABD Pad 5x9 (in/in) 1 x Per Day/30 Days Discharge Instructions: Cover with ABD pad Secured With: Kerlix Roll Sterile or Non-Sterile 6-ply 4.5x4 (yd/yd) 1 x Per Day/30 Days Discharge Instructions: Apply Kerlix as directed 1. Would recommend currently that we have the patient continue with the [facility]. 2. I am also can recommend that she continue with the ABD pad to cover followed by roll gauze secured in place. 3. She should also continue to elevate her legs much as possible she needs to be wearing an offloading shoe. If she is not using the shoe then she is going to likely need to go into a cast which I think she really does not want to do. We will see patient back for reevaluation in 1 week here in the clinic. If anything worsens or changes patient will contact our office for additional recommendations. Electronic Signature(s) Signed: 04/07/2022 2:24:52 PM By: Worthy Keeler PA-C Entered By: Worthy Keeler on 04/07/2022 14:24:51 -------------------------------------------------------------------------------- SuperBill Details Patient Name: Date of Service: Jennifer [name] 04/07/2022 Jennifer [name] ([id number]) [id number] Page 7 of 7 Medical Record Number: [id number] Patient Account Number: [id number] Date of Birth/Sex: Treating RN: [birth year] (72 y.o. Jennifer [name] Primary Care Provider: [name] Other Clinician: Referring Provider: Treating Provider/Extender: Susa Day in Treatment: 2 Diagnosis Coding ICD-10 Codes Code Description G90.09 Other idiopathic peripheral autonomic neuropathy L97.528 Non-pressure chronic ulcer of other part of left foot with other specified severity Facility Procedures : CPT4 Code: [id number] Description: [id number] - DEB SUBQ TISSUE 20 SQ CM/< ICD-10 Diagnosis Description L97.528 Non-pressure chronic ulcer of other part of left foot with other specified seve Modifier: rity Quantity: 1 Physician Procedures : CPT4 Code Description Modifier [id number] - WC PHYS SUBQ TISS 20 SQ CM ICD-10 Diagnosis Description L97.528 Non-pressure chronic ulcer of other part of left foot with other specified severity Quantity: 1 Electronic Signature(s) Signed: 04/07/2022 4:30:16 PM By: Rosalio Loud MSN RN CNS WTA Signed: 04/07/2022 5:06:45 PM By: Worthy Keeler PA-C Previous Signature: 04/07/2022 2:25:00 PM Version By: Worthy Keeler PA-C Entered By: Rosalio Loud on 04/07/2022 14:26:13

## 2022-04-08 NOTE — Telephone Encounter (Signed)
[name] what's the question

## 2022-04-08 NOTE — Progress Notes (Addendum)
SANIYYAH, MECK ([id number]) [id number] Page 1 of 11 Visit Report for 04/07/2022 Arrival Information Details Patient Name: Date of Service: Jennifer [name] 04/07/2022 1:15 PM Medical Record Number: [id number] Patient Account Number: [id number] Date of Birth/Sex: Treating RN: 03-22-1950 (72 y.o. Jennifer [name] Primary Care Jakaylah Schlafer: [name] Other Clinician: Referring Kenyia Wambolt: Treating Willa Brocks/Extender: Susa Day in Treatment: 2 Visit Information History Since Last Visit Added or deleted any medications: No Patient Arrived: Ambulatory Has Dressing in Place as Prescribed: Yes Arrival Time: 13:25 Pain Present Now: No Accompanied By: self Transfer Assistance: None Patient Requires Transmission-Based Precautions: No Patient Has Alerts: Yes Patient Alerts: Not diabetic R ABI 1.20 TBI 1.02 L ABI 1.26 TBI 1.00 Electronic Signature(s) Signed: 04/07/2022 4:30:16 PM By: Rosalio Loud MSN RN CNS WTA Entered By: Rosalio Loud on 04/07/2022 14:25:10 -------------------------------------------------------------------------------- Clinic Level of Care Assessment Details Patient Name: Date of Service: Jennifer [name] 04/07/2022 1:15 PM Medical Record Number: [id number] Patient Account Number: [id number] Date of Birth/Sex: Treating RN: Feb 10, 1950 (72 y.o. Jennifer [name] Primary Care Rakisha Pincock: [name] Other Clinician: Referring Raylee Adamec: Treating Kenijah Benningfield/Extender: Susa Day in Treatment: 2 Clinic Level of Care Assessment Items TOOL 1 Quantity Score []  - 0 Use when EandM and Procedure is performed on INITIAL visit ASSESSMENTS - Nursing Assessment / Reassessment []  - 0 General Physical Exam (combine w/ comprehensive assessment (listed just below) when performed on new pt. evals) []  - 0 Comprehensive Assessment (HX, ROS, Risk Assessments, Wounds Hx, etc.) ASSESSMENTS - Wound and Skin  Assessment / Reassessment []  - 0 Dermatologic / Skin Assessment (not related to wound area) ASSESSMENTS - Ostomy and/or Continence Assessment and Care Jennifer [name], Jennifer [name] ([id number]) [id number] Page 2 of 11 []  - 0 Incontinence Assessment and Management []  - 0 Ostomy Care Assessment and Management (repouching, etc.) PROCESS - Coordination of Care []  - 0 Simple Patient / Family Education for ongoing care []  - 0 Complex (extensive) Patient / Family Education for ongoing care []  - 0 Staff obtains Programmer, systems, Records, T Results / Process Orders est []  - 0 Staff telephones HHA, Nursing Homes / Clarify orders / etc []  - 0 Routine Transfer to another Facility (non-emergent condition) []  - 0 Routine Hospital Admission (non-emergent condition) []  - 0 New Admissions / Biomedical engineer / Ordering NPWT Apligraf, etc. , []  - 0 Emergency Hospital Admission (emergent condition) PROCESS - Special Needs []  - 0 Pediatric / Minor Patient Management []  - 0 Isolation Patient Management []  - 0 Hearing / Language / Visual special needs []  - 0 Assessment of Community assistance (transportation, D/C planning, etc.) []  - 0 Additional assistance / Altered mentation []  - 0 Support Surface(s) Assessment (bed, cushion, seat, etc.) INTERVENTIONS - Miscellaneous []  - 0 External ear exam []  - 0 Patient Transfer (multiple staff / Civil Service fast streamer / Similar devices) []  - 0 Simple Staple / Suture removal (25 or less) []  - 0 Complex Staple / Suture removal (26 or more) []  - 0 Hypo/Hyperglycemic Management (do not check if billed separately) []  - 0 Ankle / Brachial Index (ABI) - do not check if billed separately Has the patient been seen at the hospital within the last three years: Yes Total Score: 0 Level Of Care: ____ Electronic Signature(s) Signed: 04/07/2022 4:30:16 PM By: Rosalio Loud MSN RN CNS WTA Entered By: Rosalio Loud on 04/07/2022  14:26:06 -------------------------------------------------------------------------------- Encounter Discharge Information Details Patient Name: Date of Service: Jennifer [name] [facility] 04/07/2022 1:15 PM Medical Record Number: [id number] Patient Account  Number: [id number] Date of Birth/Sex: Treating RN: 1950/03/18 (72 y.o. Jennifer [name] Primary Care Hensley Aziz: [name] Other Clinician: Referring Theordore Cisnero: Treating Tressie Ragin/Extender: Susa Day in Treatment: 2 Encounter Discharge Information Items Post Procedure Vitals Discharge Condition: Stable Temperature (F): 98.1 Jennifer [name], Jennifer [name] ([id number]) [phone].pdf Page 3 of 11 Ambulatory Status: Ambulatory Pulse (bpm): 67 Discharge Destination: Home Respiratory Rate (breaths/min): 16 Transportation: Private Auto Blood Pressure (mmHg): 136/72 Accompanied By: self Schedule Follow-up Appointment: Yes Clinical Summary of Care: Electronic Signature(s) Signed: 04/07/2022 4:30:16 PM By: Rosalio Loud MSN RN CNS WTA Entered By: Rosalio Loud on 04/07/2022 14:26:55 -------------------------------------------------------------------------------- Lower Extremity Assessment Details Patient Name: Date of Service: Jennifer [name] 04/07/2022 1:15 PM Medical Record Number: [id number] Patient Account Number: [id number] Date of Birth/Sex: Treating RN: 1950-09-24 (72 y.o. Jennifer [name] Primary Care Dorsie Sethi: [name] Other Clinician: Referring Stancil Deisher: Treating Keiona Jenison/Extender: Susa Day in Treatment: 2 Edema Assessment Assessed: Jennifer [name]: Yes] Jennifer [name]: Yes] Edema: [Left: Yes] [Right: Yes] Calf Left: Right: Point of Measurement: 33 cm From Medial Instep 41 cm 39.5 cm Ankle Left: Right: Point of Measurement: 12 cm From Medial Instep 26.5 cm 27.7 cm Knee To Floor Left: Right: From Medial Instep 42 cm 42 cm Vascular Assessment Pulses: Dorsalis  Pedis Palpable: [Left:Yes] [Right:Yes] Electronic Signature(s) Signed: 04/07/2022 4:30:16 PM By: Rosalio Loud MSN RN CNS WTA Entered By: Rosalio Loud on 04/07/2022 14:25:26 Jennifer [name] ([id number].pdf Page 4 of 11 -------------------------------------------------------------------------------- Multi Wound Chart Details Patient Name: Date of Service: Jennifer [name] 04/07/2022 1:15 PM Medical Record Number: [id number] Patient Account Number: [id number] Date of Birth/Sex: Treating RN: [birth year] (72 y.o. Jennifer [name] Primary Care Kahley Leib: [name] Other Clinician: Referring Zailen Albarran: Treating Jacqulyne Gladue/Extender: Susa Day in Treatment: 2 Vital Signs Height(in): 61 Pulse(bpm): 13 Weight(lbs): 185 Blood Pressure(mmHg): 136/72 Body Mass Index(BMI): 35 Temperature(F): 98.1 Respiratory Rate(breaths/min): 16 [1:Photos:] [N/A:N/A] Right, Posterior Calcaneus Right, Lateral Foot N/A Wound Location: Pressure Injury Pressure Injury N/A Wounding Event: Pressure Ulcer Pressure Ulcer N/A Primary Etiology: Cataracts, Hypertension, History of Cataracts, Hypertension, History of N/A Comorbid History: pressure wounds, Neuropathy pressure wounds, Neuropathy 01/13/2022 01/13/2022 N/A Date Acquired: 2 2 N/A Weeks of Treatment: Open Healed - Epithelialized N/A Wound Status: No No N/A Wound Recurrence: 1x0.5x0.1 0x0x0 N/A Measurements L x W x D (cm) 0.393 0 N/A A (cm) : rea 0.039 0 N/A Volume (cm) : 16.60% 97.10% N/A % Reduction in A rea: 17.00% 96.30% N/A % Reduction in Volume: Category/Stage III Category/Stage II N/A Classification: Medium Medium N/A Exudate A mount: Serosanguineous Serosanguineous N/A Exudate Type: red, brown red, brown N/A Exudate Color: Small (1-33%) Small (1-33%) N/A Granulation A mount: Red, Pink Red N/A Granulation Quality: None Present (0%) None Present (0%)  N/A Necrotic A mount: Fat Layer (Subcutaneous Tissue): Yes Fascia: No N/A Exposed Structures: Fascia: No Fat Layer (Subcutaneous Tissue): No Tendon: No Tendon: No Muscle: No Muscle: No Joint: No Joint: No Bone: No Bone: No Small (1-33%) None N/A Epithelialization: Debridement - Excisional N/A N/A Debridement: Pre-procedure Verification/Time Out 13:56 N/A N/A Taken: Callus, Subcutaneous, Slough N/A N/A Tissue Debrided: Skin/Subcutaneous Tissue N/A N/A Level: 0.5 N/A N/A Debridement A (sq cm): rea Curette N/A N/A Instrument: Moderate N/A N/A Bleeding: Silver Nitrate N/A N/A Hemostasis A chieved: Procedure was tolerated well N/A N/A Debridement Treatment Response: 1x0.5x0.2 N/A N/A Post Debridement Measurements L x W x D (cm) 0.079 N/A N/A Post Debridement Volume: (cm) Jennifer [name], Jennifer [name] ([id number] Page 5 of 11 Category/Stage  III N/A N/A Post Debridement Stage: Debridement N/A N/A Procedures Performed: Treatment Notes Wound #1 (Calcaneus) Wound Laterality: Right, Posterior Cleanser Soap and Water Discharge Instruction: Gently cleanse wound with antibacterial soap, rinse and pat dry prior to dressing wounds Peri-Wound Care Moisturizing Lotion Discharge Instruction: Suggestions: Theraderm, Eucerin, Cetaphil, or patient preference. Topical Primary Dressing Hydrofera Blue Ready Transfer Foam, 2.5x2.5 (in/in) Discharge Instruction: Apply Hydrofera Blue Ready to wound bed as directed Secondary Dressing ABD Pad 5x9 (in/in) Discharge Instruction: Cover with ABD pad Secured With Kerlix Roll Sterile or Non-Sterile 6-ply 4.5x4 (yd/yd) Discharge Instruction: Apply Kerlix as directed Compression Wrap Compression Stockings Add-Ons Wound #2 (Foot) Wound Laterality: Right, Lateral Cleanser Peri-Wound Care Topical Primary Dressing Secondary Dressing Secured With Compression Wrap Compression Stockings Add-Ons Electronic  Signature(s) Signed: 04/07/2022 2:41:15 PM By: Rosalio Loud MSN RN CNS WTA Entered By: Rosalio Loud on 04/07/2022 14:41:15 -------------------------------------------------------------------------------- Blue Details Patient Name: Date of Service: Jennifer [name] 04/07/2022 1:15 PM Medical Record Number: [id number] Patient Account Number: [id number] Jennifer [name], Jennifer [name] ([id number]) [phone].pdf Page 6 of 11 Date of Birth/Sex: Treating RN: [birth year] (72 y.o. Jennifer [name] Primary Care Riniyah Speich: [name] Other Clinician: Referring Paxson Harrower: Treating Arna Luis/Extender: Susa Day in Treatment: 2 Active Inactive Necrotic Tissue Nursing Diagnoses: Impaired tissue integrity related to necrotic/devitalized tissue Knowledge deficit related to management of necrotic/devitalized tissue Goals: Necrotic/devitalized tissue will be minimized in the wound bed Date Initiated: 03/29/2022 Target Resolution Date: 04/29/2022 Goal Status: Active Patient/caregiver will verbalize understanding of reason and process for debridement of necrotic tissue Date Initiated: 03/29/2022 Target Resolution Date: 04/29/2022 Goal Status: Active Interventions: Assess patient pain level pre-, during and post procedure and prior to discharge Provide education on necrotic tissue and debridement process Treatment Activities: Excisional debridement : 03/24/2022 Notes: Orientation to the Wound Care Program Nursing Diagnoses: Knowledge deficit related to the wound healing [name] program Goals: Patient/caregiver will verbalize understanding of the [place] Date Initiated: 03/29/2022 Target Resolution Date: 04/15/2022 Goal Status: Active Interventions: Provide education on orientation to the wound [name] Notes: Wound/Skin Impairment Nursing Diagnoses: Impaired tissue integrity Knowledge deficit related to  ulceration/compromised skin integrity Goals: Patient will demonstrate a reduced rate of smoking or cessation of smoking Date Initiated: 03/29/2022 Target Resolution Date: 04/28/2022 Goal Status: Active Patient will have a decrease in wound volume by X% from date: (specify in notes) Date Initiated: 03/29/2022 Target Resolution Date: 04/28/2022 Goal Status: Active Patient/caregiver will verbalize understanding of skin care regimen Date Initiated: 03/29/2022 Target Resolution Date: 04/28/2022 Goal Status: Active Ulcer/skin breakdown will have a volume reduction of 30% by week 4 Date Initiated: 03/29/2022 Target Resolution Date: 04/28/2022 Goal Status: Active Ulcer/skin breakdown will have a volume reduction of 50% by week 8 Date Initiated: 03/29/2022 Target Resolution Date: 05/28/2022 Goal Status: Active Ulcer/skin breakdown will have a volume reduction of 80% by week 12 Date Initiated: 03/29/2022 Target Resolution Date: 06/28/2022 Goal Status: Active Ulcer/skin breakdown will heal within 14 weeks Date Initiated: 03/29/2022 Target Resolution Date: 07/12/2022 Goal Status: Active Interventions: ALAYSIAH, MINNIE ([id number]) [phone].pdf Page 7 of 11 Assess patient/caregiver ability to obtain necessary supplies Assess patient/caregiver ability to perform ulcer/skin care regimen upon admission and as needed Assess ulceration(s) every visit Provide education on ulcer and skin care Treatment Activities: Skin care regimen initiated : 03/24/2022 Notes: Electronic Signature(s) Signed: 04/07/2022 4:30:16 PM By: Rosalio Loud MSN RN CNS WTA Entered By: Rosalio Loud on 04/07/2022 14:26:18 -------------------------------------------------------------------------------- Pain Assessment Details Patient Name: Date of Service: Jennifer [name]  04/07/2022 1:15 PM Medical Record Number: [id number] Patient Account Number: [id number] Date of Birth/Sex: Treating RN: Jan 08, 1951 (72  y.o. Jennifer [name] Primary Care Abijah Roussel: [name] Other Clinician: Referring Raife Lizer: Treating Kelby Adell/Extender: Susa Day in Treatment: 2 Active Problems Location of Pain Severity and Description of Pain Patient Has Paino No Site Locations Pain Management and Medication Current Pain Management: Electronic Signature(s) Signed: 04/07/2022 4:30:16 PM By: Rosalio Loud MSN RN CNS WTA Entered By: Rosalio Loud on 04/07/2022 14:25:17 Jennifer [name] ([id number].pdf Page 8 of 11 -------------------------------------------------------------------------------- Patient/Caregiver Education Details Patient Name: Date of Service: Jennifer [name] 3/21/2024andnbsp1:15 PM Medical Record Number: [id number] Patient Account Number: [id number] Date of Birth/Gender: Treating RN: 1950/10/09 (72 y.o. Jennifer [name] Primary Care Physician: [name] Other Clinician: Referring Physician: Treating Physician/Extender: Susa Day in Treatment: 2 Education Assessment Education Provided To: Patient Education Topics Provided Wound Debridement: Handouts: Wound Debridement Methods: Explain/Verbal Responses: State content correctly Wound/Skin Impairment: Handouts: Caring for Your Ulcer Methods: Explain/Verbal Responses: State content correctly Electronic Signature(s) Signed: 04/07/2022 4:30:16 PM By: Rosalio Loud MSN RN CNS WTA Entered By: Rosalio Loud on 04/07/2022 14:26:23 -------------------------------------------------------------------------------- Wound Assessment Details Patient Name: Date of Service: Jennifer [name] 04/07/2022 1:15 PM Medical Record Number: [id number] Patient Account Number: [id number] Date of Birth/Sex: Treating RN: 04-19-1950 (72 y.o. Jennifer [name] Primary Care Tyjah Hai: [name] Other Clinician: Referring Bird Swetz: Treating Sayward Horvath/Extender: Susa Day in Treatment: 2 Wound Status Wound Number: 1 Primary Pressure Ulcer Etiology: Wound Location: Right, Posterior Calcaneus Wound Status: Open Wounding Event: Pressure Injury Comorbid Cataracts, Hypertension, History of pressure wounds, Date Acquired: 01/13/2022 History: Neuropathy Weeks Of Treatment: 2 Clustered Wound: No Jennifer [name], Jennifer [name] ([id number]) [phone].pdf Page 9 of 11 Photos Wound Measurements Length: (cm) 1 Width: (cm) 0.5 Depth: (cm) 0.1 Area: (cm) 0.393 Volume: (cm) 0.039 % Reduction in Area: 16.6% % Reduction in Volume: 17% Epithelialization: Small (1-33%) Wound Description Classification: Category/Stage III Exudate Amount: Medium Exudate Type: Serosanguineous Exudate Color: red, brown Foul Odor After Cleansing: No Slough/Fibrino No Wound Bed Granulation Amount: Small (1-33%) Exposed Structure Granulation Quality: Red, Pink Fascia Exposed: No Necrotic Amount: None Present (0%) Fat Layer (Subcutaneous Tissue) Exposed: Yes Tendon Exposed: No Muscle Exposed: No Joint Exposed: No Bone Exposed: No Treatment Notes Wound #1 (Calcaneus) Wound Laterality: Right, Posterior Cleanser Soap and Water Discharge Instruction: Gently cleanse wound with antibacterial soap, rinse and pat dry prior to dressing wounds Peri-Wound Care Moisturizing Lotion Discharge Instruction: Suggestions: Theraderm, Eucerin, Cetaphil, or patient preference. Topical Primary Dressing Hydrofera Blue Ready Transfer Foam, 2.5x2.5 (in/in) Discharge Instruction: Apply Hydrofera Blue Ready to wound bed as directed Secondary Dressing ABD Pad 5x9 (in/in) Discharge Instruction: Cover with ABD pad Secured With Kerlix Roll Sterile or Non-Sterile 6-ply 4.5x4 (yd/yd) Discharge Instruction: Apply Kerlix as directed Compression Wrap Compression Stockings Add-Ons Electronic Signature(s) Signed: 04/07/2022 4:30:16 PM By: Rosalio Loud MSN RN CNS  WTA Entered By: Rosalio Loud on 04/07/2022 13:37:46 Jennifer [name] ([id number].pdf Page 10 of 11 -------------------------------------------------------------------------------- Wound Assessment Details Patient Name: Date of Service: Jennifer [name] 04/07/2022 1:15 PM Medical Record Number: [id number] Patient Account Number: [id number] Date of Birth/Sex: Treating RN: 1950/12/08 (72 y.o. Jennifer [name] Primary Care Myosha Cuadras: [name] Other Clinician: Referring Laureen Frederic: Treating Gerry Heaphy/Extender: Susa Day in Treatment: 2 Wound Status Wound Number: 2 Primary Pressure Ulcer Etiology: Wound Location: Right, Lateral Foot Wound Status: Healed - Epithelialized Wounding Event: Pressure Injury Comorbid Cataracts, Hypertension, History of  pressure wounds, Date Acquired: 01/13/2022 History: Neuropathy Weeks Of Treatment: 2 Clustered Wound: No Photos Wound Measurements Length: (cm) Width: (cm) Depth: (cm) Area: (cm) Volume: (cm) 0 % Reduction in Area: 97.1% 0 % Reduction in Volume: 96.3% 0 Epithelialization: None 0 0 Wound Description Classification: Category/Stage II Exudate Amount: Medium Exudate Type: Serosanguineous Exudate Color: red, brown Foul Odor After Cleansing: No Slough/Fibrino No Wound Bed Granulation Amount: Small (1-33%) Exposed Structure Granulation Quality: Red Fascia Exposed: No Necrotic Amount: None Present (0%) Fat Layer (Subcutaneous Tissue) Exposed: No Tendon Exposed: No Muscle Exposed: No Joint Exposed: No Bone Exposed: No Treatment Notes Wound #2 (Foot) Wound Laterality: Right, Lateral Cleanser Peri-Wound Care Topical Jennifer [name], Jennifer [name] ([id number]) [phone].pdf Page 11 of 11 Primary Dressing Secondary Dressing Secured With Compression Wrap Compression Stockings Add-Ons Electronic Signature(s) Signed: 04/07/2022 4:30:16 PM By: Rosalio Loud MSN RN CNS WTA Entered By: Rosalio Loud on 04/07/2022 13:55:10 -------------------------------------------------------------------------------- Vitals Details Patient Name: Date of Service: Jennifer [name] [facility] 04/07/2022 1:15 PM Medical Record Number: [id number] Patient Account Number: [id number] Date of Birth/Sex: Treating RN: Jan 07, 1951 (72 y.o. Jennifer [name] Primary Care Lateshia Schmoker: [name] Other Clinician: Referring Aimy Sweeting: Treating Teresha Hanks/Extender: Susa Day in Treatment: 2 Vital Signs Time Taken: 13:25 Temperature (F): 98.1 Height (in): 61 Pulse (bpm): 67 Weight (lbs): 185 Respiratory Rate (breaths/min): 16 Body Mass Index (BMI): 35 Blood Pressure (mmHg): 136/72 Reference Range: 80 - 120 mg / dl Electronic Signature(s) Signed: 04/07/2022 4:30:16 PM By: Rosalio Loud MSN RN CNS WTA Entered By: Rosalio Loud on 04/07/2022 14:25:14

## 2022-04-08 NOTE — Telephone Encounter (Signed)
Pt has question in ref to fasting lab she would like a call back

## 2022-04-11 ENCOUNTER — Other Ambulatory Visit: Payer: Self-pay | Admitting: Family

## 2022-04-11 ENCOUNTER — Other Ambulatory Visit: Payer: Self-pay

## 2022-04-11 DIAGNOSIS — K649 Unspecified hemorrhoids: Secondary | ICD-10-CM

## 2022-04-11 NOTE — Telephone Encounter (Signed)
Last office visit 12/14/2021 anal fissures  Last refill 09/15/2021 2 refills  Refilled by PCP but Patient is requesting you to refill

## 2022-04-11 NOTE — Telephone Encounter (Signed)
Informed patient that she does not have to be fasting for lab work she states she will come in for lab work

## 2022-04-12 ENCOUNTER — Other Ambulatory Visit: Payer: Self-pay | Admitting: Family

## 2022-04-12 DIAGNOSIS — G629 Polyneuropathy, unspecified: Secondary | ICD-10-CM

## 2022-04-12 NOTE — Telephone Encounter (Signed)
Called and left a message for call back  

## 2022-04-12 NOTE — Telephone Encounter (Signed)
Why does she need refill- for fissure anusol wont help

## 2022-04-14 ENCOUNTER — Encounter: Payer: Medicare Other | Admitting: Physician Assistant

## 2022-04-14 DIAGNOSIS — E7800 Pure hypercholesterolemia, unspecified: Secondary | ICD-10-CM | POA: Diagnosis not present

## 2022-04-14 DIAGNOSIS — L97528 Non-pressure chronic ulcer of other part of left foot with other specified severity: Secondary | ICD-10-CM | POA: Diagnosis not present

## 2022-04-14 DIAGNOSIS — L89613 Pressure ulcer of right heel, stage 3: Secondary | ICD-10-CM | POA: Diagnosis not present

## 2022-04-14 DIAGNOSIS — G629 Polyneuropathy, unspecified: Secondary | ICD-10-CM | POA: Diagnosis not present

## 2022-04-14 DIAGNOSIS — G9009 Other idiopathic peripheral autonomic neuropathy: Secondary | ICD-10-CM | POA: Diagnosis not present

## 2022-04-14 DIAGNOSIS — G4730 Sleep apnea, unspecified: Secondary | ICD-10-CM | POA: Diagnosis not present

## 2022-04-14 DIAGNOSIS — I10 Essential (primary) hypertension: Secondary | ICD-10-CM | POA: Diagnosis not present

## 2022-04-14 NOTE — Progress Notes (Addendum)
[name], [name] ([id number]) [id number] Page 1 of 7 Visit Report for 04/14/2022 Arrival Information Details Patient Name: Date of Service: Jennifer [name] 04/14/2022 1:15 PM Medical Record Number: [id number] Patient Account Number: [id number] Date of Birth/Sex: Treating RN: 04/29/1950 (72 y.o. Jennifer [name] Primary Care Jennifer [name]: Jennifer [name] Other Clinician: Referring Jennifer [name]: Treating Jennifer [name]/Extender: Jennifer [name] in Treatment: 3 Visit Information History Since Last Visit Added or deleted any medications: No Patient Arrived: Ambulatory Has Dressing in Place as Prescribed: Yes Arrival Time: 13:23 Has Footwear/Offloading in Place as Prescribed: No Accompanied By: self Left: Regular Shoe Transfer Assistance: None Right: Wedge Shoe Patient Requires Transmission-Based Precautions: No Pain Present Now: No Patient Has Alerts: Yes Patient Alerts: Not diabetic R ABI 1.20 TBI 1.02 L ABI 1.26 TBI 1.00 Electronic Signature(s) Signed: 04/14/2022 1:38:16 PM By: Jennifer Loud MSN RN CNS WTA Entered By: Jennifer [name] on 04/14/2022 13:38:16 -------------------------------------------------------------------------------- Clinic Level of Care Assessment Details Patient Name: Date of Service: Jennifer [name] 04/14/2022 1:15 PM Medical Record Number: [id number] Patient Account Number: [id number] Date of Birth/Sex: Treating RN: 1950/08/30 (72 y.o. Jennifer [name] Primary Care Jasiah Buntin: Jennifer [name] Other Clinician: Referring Jennifer [name]: Treating Jennifer [name]/Extender: Jennifer [name] in Treatment: 3 Clinic Level of Care Assessment Items TOOL 1 Quantity Score []  - 0 Use when EandM and Procedure is performed on INITIAL visit ASSESSMENTS - Nursing Assessment / Reassessment []  - 0 General Physical Exam (combine w/ comprehensive assessment (listed just below) when performed on new pt. evals) []  - 0 Comprehensive  Assessment (HX, ROS, Risk Assessments, Wounds Hx, etc.) ASSESSMENTS - Wound and Skin Assessment / Reassessment []  - 0 Dermatologic / Skin Assessment (not related to wound area) ASSESSMENTS - Ostomy and/or Continence Assessment and Care []  - 0 Incontinence Assessment and Management []  - 0 Ostomy Care Assessment and Management (repouching, etc.) PROCESS - Coordination of Care []  - 0 Simple Patient / Family Education for ongoing care []  - 0 Complex (extensive) Patient / Family Education for ongoing care []  - 0 Staff obtains Programmer, systems, Records, T Results / Process Orders est []  - 0 Staff telephones HHA, Nursing Homes / Clarify orders / etc []  - 0 Routine Transfer to another Facility (non-emergent condition) []  - 0 Routine Hospital Admission (non-emergent condition) []  - 0 New Admissions / Insurance Authorizations / Ordering NPWT Apligraf, etc. , []  - 0 Emergency Hospital Admission (emergent condition) PROCESS - Special Needs Jennifer [name] ([id number]) [id number] Page 2 of 7 []  - 0 Pediatric / Minor Patient Management []  - 0 Isolation Patient Management []  - 0 Hearing / Language / Visual special needs []  - 0 Assessment of Community assistance (transportation, D/C planning, etc.) []  - 0 Additional assistance / Altered mentation []  - 0 Support Surface(s) Assessment (bed, cushion, seat, etc.) INTERVENTIONS - Miscellaneous []  - 0 External ear exam []  - 0 Patient Transfer (multiple staff / Civil Service fast streamer / Similar devices) []  - 0 Simple Staple / Suture removal (25 or less) []  - 0 Complex Staple / Suture removal (26 or more) []  - 0 Hypo/Hyperglycemic Management (do not check if billed separately) []  - 0 Ankle / Brachial Index (ABI) - do not check if billed separately Has the patient been seen at the hospital within the last three years: Yes Total Score: 0 Level Of Care: ____ Electronic Signature(s) Signed: 04/14/2022 4:36:18 PM By: Jennifer Loud  MSN RN CNS WTA Entered By: Jennifer [name] on 04/14/2022 13:50:22 -------------------------------------------------------------------------------- Encounter Discharge Information Details Patient Name: Date of Service:  Jennifer [name] [facility] 04/14/2022 1:15 PM Medical Record Number: [id number] Patient Account Number: [id number] Date of Birth/Sex: Treating RN: 08-Feb-1950 (72 y.o. Jennifer [name] Primary Care Jennifer [name]: Jennifer [name] Other Clinician: Referring Subrena Devereux: Treating Maleak Brazzel/Extender: Jennifer [name] in Treatment: 3 Encounter Discharge Information Items Post Procedure Vitals Discharge Condition: Stable Temperature (F): 98.0 Ambulatory Status: Ambulatory Pulse (bpm): 76 Discharge Destination: Home Respiratory Rate (breaths/min): 16 Transportation: Private Auto Blood Pressure (mmHg): 143/74 Accompanied By: self Schedule Follow-up Appointment: Yes Clinical Summary of Care: Electronic Signature(s) Signed: 04/14/2022 4:36:18 PM By: Jennifer Loud MSN RN CNS WTA Entered By: Jennifer [name] on 04/14/2022 13:51:47 -------------------------------------------------------------------------------- Lower Extremity Assessment Details Patient Name: Date of Service: Jennifer [name] 04/14/2022 1:15 PM Medical Record Number: [id number] Patient Account Number: [id number] Date of Birth/Sex: Treating RN: Aug 21, 1950 (72 y.o. Jennifer [name] Primary Care Jennifer [name]: Jennifer [name] Other Clinician: Referring Jennifer [name]: Treating Jennifer [name]/Extender: Jennifer [name] in Treatment: 3 Edema Assessment Assessed: [Left: No] Jennifer [name]: Yes] Edema: [Left: Ye] [Right: s] Calf Jennifer [name], Jennifer [name] ([id number]) [id number].pdf Page 3 of 7 Left: Right: Point of Measurement: 33 cm From Medial Instep 31 cm Ankle Left: Right: Point of Measurement: 12 cm From Medial Instep 28 cm Knee To Floor Left: Right: From Medial Instep 42 cm Electronic  Signature(s) Signed: 04/14/2022 1:38:43 PM By: Jennifer Loud MSN RN CNS WTA Entered By: Jennifer [name] on 04/14/2022 13:38:43 -------------------------------------------------------------------------------- Multi Wound Chart Details Patient Name: Date of Service: Jennifer [name] [facility] 04/14/2022 1:15 PM Medical Record Number: [id number] Patient Account Number: [id number] Date of Birth/Sex: Treating RN: 06-08-1950 (72 y.o. Jennifer [name] Primary Care Jesstin Studstill: Jennifer [name] Other Clinician: Referring Karissa Meenan: Treating Elsy Chiang/Extender: Jennifer [name] in Treatment: 3 Vital Signs Height(in): 61 Pulse(bpm): 76 Weight(lbs): 185 Blood Pressure(mmHg): 143/74 Body Mass Index(BMI): 35 Temperature(F): 98.0 Respiratory Rate(breaths/min): 16 [1:Photos:] [N/A:N/A] Right, Posterior Calcaneus N/A N/A Wound Location: Pressure Injury N/A N/A Wounding Event: Pressure Ulcer N/A N/A Primary Etiology: Cataracts, Hypertension, History of N/A N/A Comorbid History: pressure wounds, Neuropathy 01/13/2022 N/A N/A Date Acquired: 3 N/A N/A Weeks of Treatment: Open N/A N/A Wound Status: No N/A N/A Wound Recurrence: 1x0.5x0.2 N/A N/A Measurements L x W x D (cm) 0.393 N/A N/A A (cm) : rea 0.079 N/A N/A Volume (cm) : 16.60% N/A N/A % Reduction in A rea: -68.10% N/A N/A % Reduction in Volume: Category/Stage III N/A N/A Classification: Medium N/A N/A Exudate A mount: Serosanguineous N/A N/A Exudate Type: red, brown N/A N/A Exudate Color: Small (1-33%) N/A N/A Granulation A mount: Red, Pink N/A N/A Granulation Quality: None Present (0%) N/A N/A Necrotic A mount: Fat Layer (Subcutaneous Tissue): Yes N/A N/A Exposed Structures: Fascia: No Tendon: No Muscle: No Joint: No Bone: No Small (1-33%) N/A N/A Epithelialization: Treatment Notes Jennifer, [name] ([id number]) [phone].pdf Page 4 of 7 Electronic Signature(s) Signed:  04/14/2022 1:39:01 PM By: Jennifer Loud MSN RN CNS WTA Entered By: Jennifer [name] on 04/14/2022 13:39:01 -------------------------------------------------------------------------------- Multi-Disciplinary Care Plan Details Patient Name: Date of Service: Jennifer [name] [facility] 04/14/2022 1:15 PM Medical Record Number: [id number] Patient Account Number: [id number] Date of Birth/Sex: Treating RN: [birth year] (72 y.o. Jennifer [name] Primary Care Cutberto Winfree: Jennifer [name] Other Clinician: Referring Teagan Ozawa: Treating Aniyah Nobis/Extender: Jennifer [name] in Treatment: 3 Active Inactive Necrotic Tissue Nursing Diagnoses: Impaired tissue integrity related to necrotic/devitalized tissue Knowledge deficit related to management of necrotic/devitalized tissue Goals: Necrotic/devitalized tissue will be minimized in the wound bed Date Initiated: 03/29/2022 Target Resolution Date: 04/29/2022 Goal Status:  Active Patient/caregiver will verbalize understanding of reason and process for debridement of necrotic tissue Date Initiated: 03/29/2022 Target Resolution Date: 04/29/2022 Goal Status: Active Interventions: Assess patient pain level pre-, during and post procedure and prior to discharge Provide education on necrotic tissue and debridement process Treatment Activities: Excisional debridement : 03/24/2022 Notes: Wound/Skin Impairment Nursing Diagnoses: Impaired tissue integrity Knowledge deficit related to ulceration/compromised skin integrity Goals: Patient will demonstrate a reduced rate of smoking or cessation of smoking Date Initiated: 03/29/2022 Target Resolution Date: 04/28/2022 Goal Status: Active Patient will have a decrease in wound volume by X% from date: (specify in notes) Date Initiated: 03/29/2022 Target Resolution Date: 04/28/2022 Goal Status: Active Patient/caregiver will verbalize understanding of skin care regimen Date Initiated: 03/29/2022 Target Resolution Date:  04/28/2022 Goal Status: Active Ulcer/skin breakdown will have a volume reduction of 30% by week 4 Date Initiated: 03/29/2022 Target Resolution Date: 04/28/2022 Goal Status: Active Ulcer/skin breakdown will have a volume reduction of 50% by week 8 Date Initiated: 03/29/2022 Target Resolution Date: 05/28/2022 Goal Status: Active Ulcer/skin breakdown will have a volume reduction of 80% by week 12 Date Initiated: 03/29/2022 Target Resolution Date: 06/28/2022 Goal Status: Active Ulcer/skin breakdown will heal within 14 weeks Date Initiated: 03/29/2022 Target Resolution Date: 07/12/2022 Goal Status: Active Interventions: Assess patient/caregiver ability to obtain necessary supplies Jennifer [name], Jennifer [name] ([id number]) [phone].pdf Page 5 of 7 Assess patient/caregiver ability to perform ulcer/skin care regimen upon admission and as needed Assess ulceration(s) every visit Provide education on ulcer and skin care Treatment Activities: Skin care regimen initiated : 03/24/2022 Notes: Electronic Signature(s) Signed: 04/14/2022 4:36:18 PM By: Jennifer Loud MSN RN CNS WTA Entered By: Jennifer [name] on 04/14/2022 13:34:01 -------------------------------------------------------------------------------- Pain Assessment Details Patient Name: Date of Service: Jennifer [name] 04/14/2022 1:15 PM Medical Record Number: [id number] Patient Account Number: [id number] Date of Birth/Sex: Treating RN: [birth year] (72 y.o. Jennifer [name] Primary Care Gertha Lichtenberg: Jennifer [name] Other Clinician: Referring Berthold Glace: Treating Jennifer [name]/Extender: Jennifer [name] in Treatment: 3 Active Problems Location of Pain Severity and Description of Pain Patient Has Paino No Site Locations Pain Management and Medication Current Pain Management: Electronic Signature(s) Signed: 04/14/2022 1:38:25 PM By: Jennifer Loud MSN RN CNS WTA Entered By: Jennifer [name] on 04/14/2022  13:38:25 -------------------------------------------------------------------------------- Patient/Caregiver Education Details Patient Name: Date of Service: Jennifer [name] 3/28/2024andnbsp1:15 PM Medical Record Number: [id number] Patient Account Number: [id number] Date of Birth/Gender: Treating RN: 13-Jun-1950 (72 y.o. Jennifer [name] Primary Care Physician: Jennifer [name] Other Clinician: Referring Physician: Treating Physician/Extender: Jennifer [name] in Treatment: 3 Education Assessment Education Provided To: Patient Jennifer [name], Jennifer [name] ([id number]) [id number] Page 6 of 7 Education Topics Provided Offloading: Handouts: How Offloading Helps Foot Wounds Heal Methods: Explain/Verbal Responses: State content correctly Pressure: Handouts: Pressure Injury: Prevention and Offloading Methods: Explain/Verbal Responses: State content correctly Wound/Skin Impairment: Handouts: Caring for Your Ulcer Methods: Explain/Verbal Responses: State content correctly Electronic Signature(s) Signed: 04/14/2022 4:36:18 PM By: Jennifer Loud MSN RN CNS WTA Entered By: Jennifer [name] on 04/14/2022 13:34:37 -------------------------------------------------------------------------------- Wound Assessment Details Patient Name: Date of Service: Jennifer [name] 04/14/2022 1:15 PM Medical Record Number: [id number] Patient Account Number: [id number] Date of Birth/Sex: Treating RN: [birth year] (72 y.o. Jennifer [name] Primary Care Jessicia Napolitano: Jennifer [name] Other Clinician: Referring Tamekia Rotter: Treating Khristen Cheyney/Extender: Jennifer [name] in Treatment: 3 Wound Status Wound Number: 1 Primary Pressure Ulcer Etiology: Wound Location: Right, Posterior Calcaneus Wound Status: Open Wounding Event: Pressure Injury Comorbid Cataracts, Hypertension, History of pressure wounds, Date Acquired: 01/13/2022 History:  Neuropathy Weeks Of  Treatment: 3 Clustered Wound: No Photos Wound Measurements Length: (cm) 1 Width: (cm) 0.5 Depth: (cm) 0.2 Area: (cm) 0.393 Volume: (cm) 0.079 % Reduction in Area: 16.6% % Reduction in Volume: -68.1% Epithelialization: Small (1-33%) Wound Description Classification: Category/Stage III Exudate Amount: Medium Exudate Type: Serosanguineous Exudate Color: red, brown Foul Odor After Cleansing: No Slough/Fibrino No Wound Bed Granulation Amount: Small (1-33%) Exposed Structure Granulation Quality: Red, Pink Fascia Exposed: No Necrotic Amount: None Present (0%) Fat Layer (Subcutaneous Tissue) Exposed: Yes Jennifer [name], Jennifer [name] ([id number]) [phone].pdf Page 7 of 7 Tendon Exposed: No Muscle Exposed: No Joint Exposed: No Bone Exposed: No Treatment Notes Wound #1 (Calcaneus) Wound Laterality: Right, Posterior Cleanser Soap and Water Discharge Instruction: Gently cleanse wound with antibacterial soap, rinse and pat dry prior to dressing wounds Wye Discharge Instruction: Suggestions: Theraderm, Eucerin, Cetaphil, or patient preference. Topical Primary Dressing Hydrofera Blue Ready Transfer Foam, 2.5x2.5 (in/in) Discharge Instruction: Apply Hydrofera Blue Ready to wound bed as directed Secondary Dressing ABD Pad 5x9 (in/in) Discharge Instruction: Cover with ABD pad Secured With Kerlix Roll Sterile or Non-Sterile 6-ply 4.5x4 (yd/yd) Discharge Instruction: Apply Kerlix as directed Compression Wrap Compression Stockings Add-Ons Electronic Signature(s) Signed: 04/14/2022 4:36:18 PM By: Jennifer Loud MSN RN CNS WTA Entered By: Jennifer [name] on 04/14/2022 13:32:24 -------------------------------------------------------------------------------- Vitals Details Patient Name: Date of Service: Jennifer [name] 04/14/2022 1:15 PM Medical Record Number: [id number] Patient Account Number: [id number] Date of Birth/Sex: Treating  RN: Feb 02, 1950 (72 y.o. Jennifer [name] Primary Care Jennifer [name]: Jennifer [name] Other Clinician: Referring Kanyon Bunn: Treating Tajana Crotteau/Extender: Jennifer [name] in Treatment: 3 Vital Signs Time Taken: 13:24 Temperature (F): 98.0 Height (in): 61 Pulse (bpm): 76 Weight (lbs): 185 Respiratory Rate (breaths/min): 16 Body Mass Index (BMI): 35 Blood Pressure (mmHg): 143/74 Reference Range: 80 - 120 mg / dl Electronic Signature(s) Signed: 04/14/2022 1:38:20 PM By: Jennifer Loud MSN RN CNS WTA Entered By: Jennifer [name] on 04/14/2022 13:38:20

## 2022-04-14 NOTE — Progress Notes (Addendum)
TAYLR, ANDRADE ([id number]) [id number] Page 1 of 6 Visit Report for 04/14/2022 Chief Complaint Document Details Patient Name: Date of Service: Jennifer [name] 04/14/2022 1:15 PM Medical Record Number: [id number] Patient Account Number: [id number] Date of Birth/Sex: Treating RN: 1950-01-18 (72 y.o. Jennifer [name] Primary Care Provider: [name] Other Clinician: Referring Provider: Treating Provider/Extender: Susa Day in Treatment: 3 Information Obtained from: Patient Chief Complaint 03/24/2022; patient is here for review of wounds on the 2 locations on the left lateral foot Electronic Signature(s) Signed: 04/14/2022 1:09:22 PM By: Worthy Keeler PA-C Entered By: Worthy Keeler on 04/14/2022 13:09:21 -------------------------------------------------------------------------------- Debridement Details Patient Name: Date of Service: Jennifer [name] [facility] 04/14/2022 1:15 PM Medical Record Number: [id number] Patient Account Number: [id number] Date of Birth/Sex: Treating RN: September 27, 1950 (72 y.o. Jennifer [name] Primary Care Provider: [name] Other Clinician: Referring Provider: Treating Provider/Extender: Susa Day in Treatment: 3 Debridement Performed for Assessment: Wound #1 Right,Posterior Calcaneus Performed By: Physician Tommie Sams., PA-C Debridement Type: Debridement Level of Consciousness (Pre-procedure): Awake and Alert Pre-procedure Verification/Time Out Yes - 13:46 Taken: Start Time: 13:46 Pain Control: Lidocaine 4% T opical Solution T Area Debrided (L x W): otal 1 (cm) x 0.5 (cm) = 0.5 (cm) Tissue and other material debrided: Viable, Non-Viable, Slough, Subcutaneous, Slough Level: Skin/Subcutaneous Tissue Debridement Description: Excisional Instrument: Curette Bleeding: Moderate Hemostasis Achieved: Pressure Response to Treatment: Procedure was tolerated well Level of  Consciousness (Post- Awake and Alert procedure): Post Debridement Measurements of Total Wound Length: (cm) 1 Stage: Category/Stage III Width: (cm) 0.5 Depth: (cm) 0.3 Volume: (cm) 0.118 Character of Wound/Ulcer Post Debridement: Stable Post Procedure Diagnosis Same as Pre-procedure Electronic Signature(s) Signed: 04/14/2022 4:02:24 PM By: Worthy Keeler PA-C Signed: 04/14/2022 4:36:18 PM By: Rosalio Loud MSN RN CNS WTA Entered By: Rosalio Loud on 04/14/2022 13:49:26 Jennifer [name] ([id number]) [id number] Page 2 of 6 -------------------------------------------------------------------------------- HPI Details Patient Name: Date of Service: Jennifer [name] 04/14/2022 1:15 PM Medical Record Number: [id number] Patient Account Number: [id number] Date of Birth/Sex: Treating RN: [birth year] (72 y.o. Jennifer [name] Primary Care Provider: [name] Other Clinician: Referring Provider: Treating Provider/Extender: Susa Day in Treatment: 3 History of Present Illness HPI Description: ADMISSION 03/24/2022 This is a 72 year old woman who has had 2 small open areas on the right lateral foot. She is not a diabetic but does have peripheral neuropathy. She was seen by [place] [facility] yesterday. ABIs w She has not had previous wounds in this area. ABI 1.20 on the right and TBI at 1.02. On the left 1.23 and 1.00 on the left Past medical history; is not noted not a diabetic. She has gastroesophageal reflux disease, high cholesterol, hyperlipidemia, hypertension, hypothyroidism, sleep apnea 04-07-2022 upon evaluation today patient appears to be doing well currently in regard to her wound. The lateral portion of her foot appears to be completely healed which is great news and I am very pleased in that regard. In regard to the medial portion of her foot and the heel location this is still open and does appear to need some sharp  debridement today. I discussed with her today that we are going to proceed with debridement at this point. She is in agreement with that plan. 04-14-2022 upon evaluation today patient appears to be doing well currently in regard to her wound. She has been tolerating the dressing changes without complication and fortunately I do not see any signs of active infection locally  nor systemically at this time. She did have some area of dark discoloration where I used a silver nitrate last week because of this she felt like that the [facility] was doing worse for her and she quit using it just using Neosporin. With that being said right now I think she really needs to get back to using the [facility] and she needs to be using offloading shoe. Electronic Signature(s) Signed: 04/14/2022 1:52:11 PM By: Worthy Keeler PA-C Entered By: Worthy Keeler on 04/14/2022 13:52:11 -------------------------------------------------------------------------------- Physical Exam Details Patient Name: Date of Service: Jennifer [name] 04/14/2022 1:15 PM Medical Record Number: [id number] Patient Account Number: [id number] Date of Birth/Sex: Treating RN: 04/04/1950 (72 y.o. Jennifer [name] Primary Care Provider: [name] Other Clinician: Referring Provider: Treating Provider/Extender: Susa Day in Treatment: 3 Constitutional Obese and well-hydrated in no acute distress. Respiratory normal breathing without difficulty. Psychiatric this patient is able to make decisions and demonstrates good insight into disease process. Alert and Oriented x 3. pleasant and cooperative. Notes Upon inspection patient's wound bed actually showed signs of good granulation epithelization at this point. Fortunately I do not see any evidence of infection she did have some bruising and dark discoloration along with some of the silver nitrate which is why she quit using the [facility] but  nonetheless I think this is a wrong move she also quit using her heel offloading shoe which I think is the biggest issue here. She needs to go back to using that. Electronic Signature(s) Signed: 04/14/2022 1:52:36 PM By: Worthy Keeler PA-C Entered By: Worthy Keeler on 04/14/2022 13:52:36 -------------------------------------------------------------------------------- Physician Orders Details Patient Name: Date of Service: Jennifer [name] 04/14/2022 1:15 PM Medical Record Number: [id number] Patient Account Number: [id number] Date of Birth/Sex: Treating RN: [birth year] (72 y.o. Jennifer [name] Primary Care Provider: [name] Other Clinician: Santiago [name] ([id number]) [id number] Page 3 of 6 Referring Provider: Treating Provider/Extender: Susa Day in Treatment: 3 Verbal / Phone Orders: No Diagnosis Coding ICD-10 Coding Code Description G90.09 Other idiopathic peripheral autonomic neuropathy L97.528 Non-pressure chronic ulcer of other part of left foot with other specified severity Follow-up Appointments Return Appointment in 1 week. Bathing/ [employer] wounds with antibacterial soap and water. Off-Loading Heel suspension boot Wound Treatment Wound #1 - Calcaneus Wound Laterality: Right, Posterior Cleanser: Soap and Water 1 x Per Day/30 Days Discharge Instructions: Gently cleanse wound with antibacterial soap, rinse and pat dry prior to dressing wounds Peri-Wound Care: Moisturizing Lotion 1 x Per Day/30 Days Discharge Instructions: Suggestions: Theraderm, Eucerin, Cetaphil, or patient preference. Prim Dressing: Hydrofera Blue Ready Transfer Foam, 2.5x2.5 (in/in) (Generic) 1 x Per Day/30 Days ary Discharge Instructions: Apply Hydrofera Blue Ready to wound bed as directed Secondary Dressing: ABD Pad 5x9 (in/in) 1 x Per Day/30 Days Discharge Instructions: Cover with ABD pad Secured With: Kerlix Roll  Sterile or Non-Sterile 6-ply 4.5x4 (yd/yd) 1 x Per Day/30 Days Discharge Instructions: Apply Kerlix as directed Electronic Signature(s) Signed: 04/14/2022 4:02:24 PM By: Worthy Keeler PA-C Signed: 04/14/2022 4:36:18 PM By: Rosalio Loud MSN RN CNS WTA Entered By: Rosalio Loud on 04/14/2022 13:50:15 -------------------------------------------------------------------------------- Problem List Details Patient Name: Date of Service: Jennifer [name] [place] 04/14/2022 1:15 PM Medical Record Number: [id number] Patient Account Number: [id number] Date of Birth/Sex: Treating RN: [birth year] (72 y.o. Jennifer [name] Primary Care Provider: [name] Other Clinician: Referring Provider: Treating Provider/Extender: Susa Day in Treatment: 3 Active Problems  ICD-10 Encounter Code Description Active Date MDM Diagnosis G90.09 Other idiopathic peripheral autonomic neuropathy 03/24/2022 No Yes L97.528 Non-pressure chronic ulcer of other part of left foot with other specified 03/24/2022 No Yes severity Inactive Problems Resolved Problems Jennifer [name], Jennifer [name] ([id number]) [id number] Page 4 of 6 Electronic Signature(s) Signed: 04/14/2022 1:09:19 PM By: Worthy Keeler PA-C Entered By: Worthy Keeler on 04/14/2022 13:09:19 -------------------------------------------------------------------------------- Progress Note Details Patient Name: Date of Service: Jennifer [name] 04/14/2022 1:15 PM Medical Record Number: [id number] Patient Account Number: [id number] Date of Birth/Sex: Treating RN: [birth year] (72 y.o. Jennifer [name] Primary Care Provider: [name] Other Clinician: Referring Provider: Treating Provider/Extender: Susa Day in Treatment: 3 Subjective Chief Complaint Information obtained from Patient 03/24/2022; patient is here for review of wounds on the 2 locations on the left lateral foot History of Present  Illness (HPI) ADMISSION 03/24/2022 This is a 72 year old woman who has had 2 small open areas on the right lateral foot. She is not a diabetic but does have peripheral neuropathy. She was seen by [place] [facility] yesterday. ABIs w She has not had previous wounds in this area. ABI 1.20 on the right and TBI at 1.02. On the left 1.23 and 1.00 on the left Past medical history; is not noted not a diabetic. She has gastroesophageal reflux disease, high cholesterol, hyperlipidemia, hypertension, hypothyroidism, sleep apnea 04-07-2022 upon evaluation today patient appears to be doing well currently in regard to her wound. The lateral portion of her foot appears to be completely healed which is great news and I am very pleased in that regard. In regard to the medial portion of her foot and the heel location this is still open and does appear to need some sharp debridement today. I discussed with her today that we are going to proceed with debridement at this point. She is in agreement with that plan. 04-14-2022 upon evaluation today patient appears to be doing well currently in regard to her wound. She has been tolerating the dressing changes without complication and fortunately I do not see any signs of active infection locally nor systemically at this time. She did have some area of dark discoloration where I used a silver nitrate last week because of this she felt like that the [name] was doing worse for her and she quit using it just using Neosporin. With that being said right now I think she really needs to get back to using the [name] and she needs to be using offloading shoe. Objective Constitutional Obese and well-hydrated in no acute distress. Vitals Time Taken: 1:24 PM, Height: 61 in, Weight: 185 lbs, BMI: 35, Temperature: 98.0 F, Pulse: 76 bpm, Respiratory Rate: 16 breaths/min, Blood Pressure: 143/74 mmHg. Respiratory normal breathing without  difficulty. Psychiatric this patient is able to make decisions and demonstrates good insight into disease process. Alert and Oriented x 3. pleasant and cooperative. General Notes: Upon inspection patient's wound bed actually showed signs of good granulation epithelization at this point. Fortunately I do not see any evidence of infection she did have some bruising and dark discoloration along with some of the silver nitrate which is why she quit using the [facility] but nonetheless I think this is a wrong move she also quit using her heel offloading shoe which I think is the biggest issue here. She needs to go back to using that. Integumentary (Hair, Skin) Wound #1 status is Open. Original cause of wound was Pressure Injury. The date acquired was:  01/13/2022. The wound has been in treatment 3 weeks. The wound is located on the Right,Posterior Calcaneus. The wound measures 1cm length x 0.5cm width x 0.2cm depth; 0.393cm^2 area and 0.079cm^3 volume. There is Fat Layer (Subcutaneous Tissue) exposed. There is a medium amount of serosanguineous drainage noted. There is small (1-33%) red, pink granulation within the wound bed. There is no necrotic tissue within the wound bed. Jennifer [name], Jennifer [name] ([id number]) [id number] Page 5 of 6 Assessment Active Problems ICD-10 Other idiopathic peripheral autonomic neuropathy Non-pressure chronic ulcer of other part of left foot with other specified severity Procedures Wound #1 Pre-procedure diagnosis of Wound #1 is a Pressure Ulcer located on the Right,Posterior Calcaneus . There was a Excisional Skin/Subcutaneous Tissue Debridement with a total area of 0.5 sq cm performed by Tommie Sams., PA-C. With the following instrument(s): Curette to remove Viable and Non-Viable tissue/material. Material removed includes Subcutaneous Tissue and Slough and after achieving pain control using Lidocaine 4% T opical Solution. No specimens were  taken. A time out was conducted at 13:46, prior to the start of the procedure. A Moderate amount of bleeding was controlled with Pressure. The procedure was tolerated well. Post Debridement Measurements: 1cm length x 0.5cm width x 0.3cm depth; 0.118cm^3 volume. Post debridement Stage noted as Category/Stage III. Character of Wound/Ulcer Post Debridement is stable. Post procedure Diagnosis Wound #1: Same as Pre-Procedure Plan Follow-up Appointments: Return Appointment in 1 week. Bathing/ Shower/ Hygiene: Wash wounds with antibacterial soap and water. Off-Loading: Heel suspension boot WOUND #1: - Calcaneus Wound Laterality: Right, Posterior Cleanser: Soap and Water 1 x Per Day/30 Days Discharge Instructions: Gently cleanse wound with antibacterial soap, rinse and pat dry prior to dressing wounds Peri-Wound Care: Moisturizing Lotion 1 x Per Day/30 Days Discharge Instructions: Suggestions: Theraderm, Eucerin, Cetaphil, or patient preference. Prim Dressing: Hydrofera Blue Ready Transfer Foam, 2.5x2.5 (in/in) (Generic) 1 x Per Day/30 Days ary Discharge Instructions: Apply Hydrofera Blue Ready to wound bed as directed Secondary Dressing: ABD Pad 5x9 (in/in) 1 x Per Day/30 Days Discharge Instructions: Cover with ABD pad Secured With: Kerlix Roll Sterile or Non-Sterile 6-ply 4.5x4 (yd/yd) 1 x Per Day/30 Days Discharge Instructions: Apply Kerlix as directed 1. Would recommend currently that we have the patient continue to monitor for any signs of worsening or infection. Based on lab seeing I do believe good progress here. Fortunately I do not see any signs of infection and I do think however she needs to be using the heel offloading shoe. I recommended that she wear this that she is making throughout her day when she has to drive she can bring her slippers to put on the drive with and then put the shoe back on when she gets out she was doing that with a regular shoe anyway wearing slippers to drive  with and then putting her regular shoe on. 2. I am also can recommend that we continue with the [facility] Blue followed by ABD pad and roll gauze to secure in place. We will see patient back for reevaluation in 1 week here in the clinic. If anything worsens or changes patient will contact our office for additional recommendations. Electronic Signature(s) Signed: 04/14/2022 1:53:20 PM By: Worthy Keeler PA-C Entered By: Worthy Keeler on 04/14/2022 13:53:20 -------------------------------------------------------------------------------- SuperBill Details Patient Name: Date of Service: Jennifer [name] 04/14/2022 Medical Record Number: [id number] Patient Account Number: [id number] Date of Birth/Sex: Treating RN: February 06, 1950 (72 y.o. Jennifer [name] Primary Care Provider: [name] Other Clinician: Referring Provider: Treating Provider/Extender:  Stone, Dereck Leep Weeks in Treatment: 3 Diagnosis Coding ICD-10 Codes Jennifer [name], Jennifer [name] ([id number]) [id number] Page 6 of 6 Code Description G90.09 Other idiopathic peripheral autonomic neuropathy L97.528 Non-pressure chronic ulcer of other part of left foot with other specified severity Facility Procedures : CPT4 Code: [id number] Description: [id number] - DEB SUBQ TISSUE 20 SQ CM/< ICD-10 Diagnosis Description L97.528 Non-pressure chronic ulcer of other part of left foot with other specified seve Modifier: rity Quantity: 1 Physician Procedures : CPT4 Code Description Modifier [id number] - WC PHYS SUBQ TISS 20 SQ CM ICD-10 Diagnosis Description L97.528 Non-pressure chronic ulcer of other part of left foot with other specified severity Quantity: 1 Electronic Signature(s) Signed: 04/14/2022 1:53:32 PM By: Worthy Keeler PA-C Entered By: Worthy Keeler on 04/14/2022 13:53:32

## 2022-04-20 ENCOUNTER — Ambulatory Visit: Payer: Medicare Other | Attending: Neurosurgery

## 2022-04-20 DIAGNOSIS — M6281 Muscle weakness (generalized): Secondary | ICD-10-CM

## 2022-04-20 DIAGNOSIS — R262 Difficulty in walking, not elsewhere classified: Secondary | ICD-10-CM | POA: Insufficient documentation

## 2022-04-20 DIAGNOSIS — M5431 Sciatica, right side: Secondary | ICD-10-CM | POA: Insufficient documentation

## 2022-04-20 DIAGNOSIS — M5416 Radiculopathy, lumbar region: Secondary | ICD-10-CM

## 2022-04-20 DIAGNOSIS — M5459 Other low back pain: Secondary | ICD-10-CM | POA: Insufficient documentation

## 2022-04-20 DIAGNOSIS — R2681 Unsteadiness on feet: Secondary | ICD-10-CM | POA: Insufficient documentation

## 2022-04-20 DIAGNOSIS — M5441 Lumbago with sciatica, right side: Secondary | ICD-10-CM | POA: Diagnosis not present

## 2022-04-20 DIAGNOSIS — G8929 Other chronic pain: Secondary | ICD-10-CM | POA: Insufficient documentation

## 2022-04-20 DIAGNOSIS — M4310 Spondylolisthesis, site unspecified: Secondary | ICD-10-CM | POA: Insufficient documentation

## 2022-04-20 NOTE — Therapy (Signed)
[facility] [address], [place], [address] Phone: [phone]   Fax:  [phone]  Physical Therapy Evaluation  Patient Details  Name: Jennifer [name] MRN: [id number] Date of Birth: 05-06-1950 Referring Provider (PT): Meade Maw, MD   Encounter Date: 04/20/2022   PT End of Session - 04/20/22 0934     Visit Number 1    Number of Visits 17    Date for PT Re-Evaluation 06/16/22    PT Start Time 0934    PT Stop Time 1020    PT Time Calculation (min) 46 min    Equipment Utilized During Treatment Gait belt    Activity Tolerance Patient tolerated treatment well    Behavior During Therapy WFL for tasks assessed/performed             Past Medical History:  Diagnosis Date   Anemia    Anxiety    Bariatric surgery status    Complication of anesthesia    Diificulty breathing for about 15 minutes after bariatric surgery   Constipation    COVID-19    Dysrhythmia    Elevated liver enzymes    GERD (gastroesophageal reflux disease)    Hemorrhoids    Herpes genitalis    High cholesterol    Hyperlipidemia    Hypertension    Hypothyroidism    IDA (iron deficiency anemia) 02/09/2021   Neuropathy    Osteoarthritis    Sleep apnea    CPAP    Past Surgical History:  Procedure Laterality Date   ABDOMINAL HYSTERECTOMY     total for fibroids no h/o abnormal pap   bariatric sleeve  2015   BREAST EXCISIONAL BIOPSY Left 1998   carpal tunnel repair     CATARACT EXTRACTION W/PHACO Right 11/01/2021   Procedure: CATARACT EXTRACTION PHACO AND INTRAOCULAR LENS PLACEMENT ([place]) RIGHT;  Surgeon: Eulogio Bear, MD;  Location: [facility];  Service: Ophthalmology;  Laterality: Right;  sleep apnea 4.95 00:49.7   CATARACT EXTRACTION W/PHACO Left 11/15/2021   Procedure: CATARACT EXTRACTION PHACO AND INTRAOCULAR LENS PLACEMENT ([facility]) LEFT 4.94 00:32.3;  Surgeon: Eulogio Bear, MD;  Location: [facility];  Service: Ophthalmology;  Laterality: Left;  sleep apnea   COLONOSCOPY WITH PROPOFOL N/A 02/11/2016   Procedure: COLONOSCOPY WITH PROPOFOL;  Surgeon: Jonathon Bellows, MD;  Location: [facility] ENDOSCOPY;  Service: Endoscopy;  Laterality: N/A;   COLONOSCOPY WITH PROPOFOL N/A 10/01/2019   Procedure: COLONOSCOPY WITH PROPOFOL;  Surgeon: Jonathon Bellows, MD;  Location: [facility] ENDOSCOPY;  Service: Gastroenterology;  Laterality: N/A;   COLONOSCOPY WITH PROPOFOL N/A 11/19/2020   Procedure: COLONOSCOPY WITH PROPOFOL;  Surgeon: Jonathon Bellows, MD;  Location: [facility] ENDOSCOPY;  Service: Gastroenterology;  Laterality: N/A;   ESOPHAGOGASTRODUODENOSCOPY (EGD) WITH PROPOFOL N/A 12/02/2019   Procedure: ESOPHAGOGASTRODUODENOSCOPY (EGD) WITH PROPOFOL;  Surgeon: Jonathon Bellows, MD;  Location: [facility] ENDOSCOPY;  Service: Gastroenterology;  Laterality: N/A;   ESOPHAGOGASTRODUODENOSCOPY (EGD) WITH PROPOFOL N/A 11/19/2020   Procedure: ESOPHAGOGASTRODUODENOSCOPY (EGD) WITH PROPOFOL;  Surgeon: Jonathon Bellows, MD;  Location: [facility] ENDOSCOPY;  Service: Gastroenterology;  Laterality: N/A;   FLEXIBLE SIGMOIDOSCOPY N/A 02/06/2021   Procedure: FLEXIBLE SIGMOIDOSCOPY;  Surgeon: [name], DO;  Location: [facility] ENDOSCOPY;  Service: Gastroenterology;  Laterality: N/A;   GIVENS CAPSULE STUDY N/A 06/07/2021   Procedure: GIVENS CAPSULE STUDY;  Surgeon: Lin Landsman, MD;  Location: [facility] ENDOSCOPY;  Service: Gastroenterology;  Laterality: N/A;   GIVENS CAPSULE STUDY N/A 06/09/2021   Procedure: GIVENS CAPSULE STUDY;  Surgeon: Marius Ditch,  Tally Due, MD;  Location: [facility] [place];  Service: Gastroenterology;  Laterality: N/A;   HEMORRHOID SURGERY     LAPAROSCOPIC GASTRIC RESTRICTIVE DUODENAL PROCEDURE (DUODENAL SWITCH) Bilateral    2020    There were no vitals filed for this visit.    Subjective Assessment - 04/20/22 0936     Subjective Low back pain: 6/10 currently, 10/10 at worst for the past 3 months (such as when standing up for too  long); R LE: 4/10 R gastroc area currently, 7/10 at worst for the past 3 months.    Pertinent History Low back pain with R LE sciatica. PT states having R LE neuropathy. Has a R heel wound and currently wearing an orthotic shoe which makes her feel off balance. Usually off balance as well without the orthotic shoe due to R LE neuropathy.  R LE symptoms along the L5 dermatome. Has felt R thigh numbness since having surgery in which an intsrument went up her R thigh last year.  Also has R LE symptoms that shoots all the way down to her R foot (along the L 5 dermatome). Has central low back pain which began about 20 years ago, gradual onset. Denies loss of bowel or bladder control or saddle anesthesia. Pain has worsend since onset.    Patient Stated Goals Pt states she does not think PT will not help due to arthritis but does not know.    Currently in Pain? Yes    Pain Score 6     Pain Type Chronic pain    Pain Radiating Towards R  L 5 dermatome to foot    Pain Onset More than a month ago    Pain Frequency Constant    Aggravating Factors  Standing for 10-15 minutes, lifting something too heavy (about a case of 24 water bottles), sitting or laying down and not moving around    Pain Relieving Factors icy hot, heating pad                [facility] PT Assessment - 04/20/22 0938       Assessment   Medical Diagnosis M54.41,G89.29 (ICD-10-CM) - Chronic right-sided low back pain with right-sided sciatica  M43.10 (ICD-10-CM) - Anterolisthesis    Referring Provider (PT) Meade Maw, MD    Onset Date/Surgical Date 03/22/22   Date PT referral signed. Chronic condition   Prior Therapy No known PT for current condition      Precautions   Precaution Comments Fall risk      Restrictions   Other Position/Activity Restrictions No known restrictions      Balance Screen   Has the patient fallen in the past 6 months No   But catches herself due to close calls. Holds on to the chair.   Has the patient  had a decrease in activity level because of a fear of falling?  Yes    Is the patient reluctant to leave their home because of a fear of falling?  No      Home Environment   Additional Comments Pt lives in a 1 story home with husband. 3-4 steps to enter at the [place].      Posture/Postural Control   Posture Comments Pt wearing orthotic shoe on R.  protracted neck, R lateral shift, B protracted shoulders, R shoulder higher, slight kyphosis, R lumbar convexity, L thoracic convexity. R knee higher (but pt currently wearing an orthotic shoe)      AROM   Lumbar Flexion full, no pain,  aberrant movement on the return motion suggesting decreased core strength.    Lumbar Extension WFL with reproduction of low back pain    Lumbar - Right Side [facility] with reproduction of low back pain    Lumbar - Left Side [facility] with R lateral hip pull and low back pain    Lumbar - Right Rotation full with low back tightness    Lumbar - Left Rotation full wiht low back tigheness, LOB, min A to recover.      Strength   Right Hip Flexion 4/5    Right Hip Extension 3+/5   seated manually resisted   Right Hip ABduction 4/5   seated manually resisted   Left Hip Flexion 4-/5    Left Hip Extension 4-/5   seated manually resisted   Left Hip ABduction 4/5   seated manually resisted   Right Knee Flexion 4+/5    Right Knee Extension 5/5    Left Knee Flexion 4-/5    Left Knee Extension 5/5      Palpation   Palpation comment TTP B greater trochanters      Ambulation/Gait   Gait Comments unsteady                        Objective measurements completed on examination: See above findings.    [place] last year a few times while walking. Slipped on snow on the grass one time, and the other time, pt was walking too fast, lost her balance, and the next thing she knows, she was on the floor.   Pt was told to have arthritis in her neck and low back.    No latex allergies Blood pressure is  controlled per pt.   L to R pressure to L thoracic convexity decreased low back pressure in standing.   Gait: unsteady. Pt states having a SPC in the car.        Response to treatment Pt tolerated session well without aggravation of symptoms.     Clinical impression Pt is a 72 year old female who came to physical therapy secondary to chronic low back pain with R LE symptoms. She also presents with unsteadiness of gait, altered posture, aberrant movement when returning from the lumbar flexion positions, reproduction of symptoms with lumbar AROM, decreased trunk and bilateral hip strength, and difficulty performing tasks which involve standing, lifting, as well as sitting secondary to pain. Pt will benefit from skilled physical therapy services to address the aforementioned deficits.                    PT Education - 04/20/22 1114     Education Details plan of care    Person(s) Educated Patient    Methods Explanation    Comprehension Verbalized understanding              PT Short Term Goals - 04/20/22 1056       PT SHORT TERM GOAL #1   Title Pt will be independent with her initial HEP to improve strength, decrease pain, and improve ability to ambulate, perform standing and lifting tasks more comfortably for her back and with less difficulty.    Time 3    Period Weeks    Status New    Target Date 05/12/22               PT Long Term Goals - 04/20/22 1057       PT LONG TERM GOAL #  1   Title Pt will have a decrease in low back pain to 5/10 or less at worst to promote ability to ambulate, perform standing and lifting tasks more comfortably for her back and with less difficulty.    Baseline 10/10 low back pain at worst for the past 3 months (04/20/2022)    Time 8    Period Weeks    Status New    Target Date 06/16/22      PT LONG TERM GOAL #2   Title Pt will have a decrease in R LE pain to 3/10 or less at worst to promote ability to ambulate, perform  standing and lifting tasks more comfortably and with less difficulty.    Baseline 7/10 R LE pain at worst for the past 3 months (04/20/2022)    Time 8    Period Weeks    Status New    Target Date 06/16/22      PT LONG TERM GOAL #3   Title Pt will improve bilateral hip extension and abduction strength by at least 1/2 MMT grade to promote ability to ambulate, perform standing tasks with less difficulty and less low back pain.    Baseline Seated manually resisted hip extension 3+/5 R, 4-/5 L, hip abduction 4/5 R, 4/5 L (04/20/2022)    Time 8    Period Weeks    Status New    Target Date 06/16/22      PT LONG TERM GOAL #4   Title Pt will improve her lumbar spine FOTO score by at least 10 points as a demonstration of improved function.    Baseline Lumbar spine FOTO 31 (04/20/2022)    Time 8    Period Weeks    Status New    Target Date 06/16/22                    Plan - 04/20/22 1052     Clinical Impression Statement Pt is a 72 year old female who came to physical therapy secondary to chronic low back pain with R LE symptoms. She also presents with unsteadiness of gait, altered posture, aberrant movement when returning from the lumbar flexion positions, reproduction of symptoms with lumbar AROM, decreased trunk and bilateral hip strength, and difficulty performing tasks which involve standing, lifting, as well as sitting secondary to pain. Pt will benefit from skilled physical therapy services to address the aforementioned deficits.    Personal Factors and Comorbidities Age;Comorbidity 3+;Fitness;Past/Current Experience;Time since onset of injury/illness/exacerbation    Comorbidities Anxiety, HTN, neuropathy, osteoarthritis, sleep apnea, R heel wound, hx of bariatric surgery    Examination-Activity Limitations Bed Mobility;Bathing;Reach Overhead;Stairs;Dressing;Stand;Bend;Hygiene/Grooming;Sit;Caring for Others;Lift;Sleep;Transfers;Carry;Locomotion Level;Squat    Examination-Participation  Restrictions Yard Work;Laundry;Cleaning    Stability/Clinical Decision Making Evolving/Moderate complexity   pt states pain has worsened since onset   Clinical Decision Making Moderate    Rehab Potential Fair    PT Frequency 2x / week    PT Duration 8 weeks    PT Treatment/Interventions Therapeutic exercise;Therapeutic activities;Functional mobility training;Balance training;Neuromuscular re-education;Patient/family education;Manual techniques;Dry needling;Aquatic Therapy;Electrical Stimulation;Iontophoresis 4mg /ml Dexamethasone;Gait training;Stair training    PT Next Visit Plan posture, thoracic extension, scapular, trunk, glute strengthening, lumbopelvic control, balance, manual techniques, modalities PRN    Consulted and Agree with Plan of Care Patient             Patient will benefit from skilled therapeutic intervention in order to improve the following deficits and impairments:  Pain, Postural dysfunction, Improper body mechanics, Difficulty walking, Decreased strength,  Decreased endurance, Decreased balance, Abnormal gait  Visit Diagnosis: Other low back pain - Plan: PT plan of care cert/re-cert  Radiculopathy, lumbar region - Plan: PT plan of care cert/re-cert  Sciatica, right side - Plan: PT plan of care cert/re-cert  Muscle weakness (generalized) - Plan: PT plan of care cert/re-cert  Difficulty in walking, not elsewhere classified - Plan: PT plan of care cert/re-cert  Unsteadiness on feet - Plan: PT plan of care cert/re-cert     Problem List Patient Active Problem List   Diagnosis Date Noted   Transaminitis 11/22/2021   Tinea pedis 08/31/2021   Upper GI bleed 06/06/2021   Alcohol abuse 06/06/2021   Eye discharge 02/16/2021   IDA (iron deficiency anemia) 02/09/2021   Acute blood loss anemia 02/06/2021   Elevated liver function tests    Bright red blood per rectum 02/05/2021   Open wound of left heel 01/20/2021   Anxiety and depression 11/25/2020   CPAP  (continuous positive airway pressure) dependence 11/25/2020   Sinusitis 11/25/2020   Allergic conjunctivitis of both eyes and rhinitis 11/25/2020   GI bleed 11/17/2020   Chronic iron deficiency anemia    Symptomatic anemia 11/11/2020   Postop check 08/07/2020   Biliary sludge determined by ultrasound 07/02/2020   Right upper quadrant abdominal pain 06/10/2020   Low back pain 05/18/2020   Muscle spasm 05/18/2020   Bilateral tinnitus 04/30/2020   Irregular heartbeat 02/25/2020   Diabetes mellitus without complication [id number]   Strain of right knee 02/10/2020   Aspirin long-term use 10/22/2019   Wound ballistics 08/29/2019   Displacement of lumbar intervertebral disc without myelopathy 0000000   Helicobacter pylori gastrointestinal tract infection 08/29/2019   Hypercholesterolemia 08/29/2019   Phlebitis after infusion 06/26/2019   Superficial thrombophlebitis of left upper extremity 06/26/2019   BMI 38.0-38.9,adult 06/19/2019   Small bowel obstruction 05/25/2019   Morbid obesity 05/16/2019   Cervical pain (neck) 04/09/2019   Pedal edema 03/06/2019   Pre-operative clearance 03/06/2019   History of sleeve gastrectomy 01/23/2019   Chronic pain syndrome 10/03/2018   Obstructive sleep apnea 10/03/2018   Right shoulder pain 06/08/2018   Chronic venous insufficiency 05/24/2018   Lymphedema 05/24/2018   Lumbar spondylosis 05/17/2018   Leg swelling 05/09/2018   Neuropathy 01/24/2018   Insomnia 07/12/2017   Fracture of phalanx of finger 05/31/2017   Impingement syndrome of shoulder region 05/31/2017   Hemorrhoids 12/15/2016   Hypothyroidism 10/03/2016   Chronic shoulder bursitis 08/30/2016   Positive ANA (antinuclear antibody) 08/30/2016   Bradycardia 06/16/2016   History of adenomatous polyp of colon 05/02/2016   Elevated liver enzymes 04/06/2016   OSA (obstructive sleep apnea) 04/06/2016   Bilateral shoulder pain 01/13/2016   Fatty liver 12/08/2015   Arthritis 12/08/2015    History of bariatric surgery 12/07/2015   Genital herpes 11/11/2015   Hyperlipidemia 11/11/2015   Essential hypertension 11/11/2015   Routine physical examination 11/11/2015   Allergic rhinitis 11/11/2015   GERD (gastroesophageal reflux disease) 11/11/2015   Herpesviral infection, unspecified 01/08/2014   Disorder of thyroid, unspecified 01/08/2014   Lumbar radiculitis 11/19/2013   Neuritis or radiculitis due to rupture of lumbar intervertebral disc 11/19/2013   Monilial vaginitis 08/20/2013   Gastritis and duodenitis 07/25/2013   Impaired fasting glucose 07/17/2012   Obesity, unspecified 07/17/2012   Obesity 07/17/2012   Depression 03/15/2012   Abnormal electrocardiogram (ECG) (EKG) 03/06/2012   URI (upper respiratory infection) 01/16/2012   Joneen Boers PT, DPT  04/20/2022, 11:24 AM  [facility] [address] [place], [address] Phone: [phone]   Fax:  [phone]  Name: Jennifer [name] MRN: [id number] Date of Birth: 1950-09-29

## 2022-04-22 ENCOUNTER — Encounter: Payer: Medicare Other | Attending: Physician Assistant | Admitting: Physician Assistant

## 2022-04-22 DIAGNOSIS — E7800 Pure hypercholesterolemia, unspecified: Secondary | ICD-10-CM | POA: Diagnosis not present

## 2022-04-22 DIAGNOSIS — L97528 Non-pressure chronic ulcer of other part of left foot with other specified severity: Secondary | ICD-10-CM | POA: Diagnosis not present

## 2022-04-22 DIAGNOSIS — L89613 Pressure ulcer of right heel, stage 3: Secondary | ICD-10-CM | POA: Insufficient documentation

## 2022-04-22 DIAGNOSIS — I10 Essential (primary) hypertension: Secondary | ICD-10-CM | POA: Diagnosis not present

## 2022-04-22 DIAGNOSIS — L89623 Pressure ulcer of left heel, stage 3: Secondary | ICD-10-CM | POA: Diagnosis not present

## 2022-04-22 DIAGNOSIS — G629 Polyneuropathy, unspecified: Secondary | ICD-10-CM | POA: Diagnosis not present

## 2022-04-22 DIAGNOSIS — G9009 Other idiopathic peripheral autonomic neuropathy: Secondary | ICD-10-CM | POA: Diagnosis not present

## 2022-04-22 NOTE — Progress Notes (Signed)
Jennifer [name], Jennifer [name] ([id number]) [id number] Page 1 of 6 Visit Report for 04/22/2022 Arrival Information Details Patient Name: Date of Service: Jennifer [name], Jennifer [name] 04/22/2022 12:00 PM Medical Record Number: [id number] Patient Account Number: [id number] Date of Birth/Sex: Treating RN: [name] (72 y.o. Jennifer [name]) [name], Jennifer Primary Care Jennifer [name]: Jennifer PlowmanArnett, Jennifer Other Clinician: Referring Jennifer [name]: Treating Jennifer [name]/Extender: Jennifer [name], Jennifer [name], Jennifer [name] in Treatment: 4 Visit Information History Since Last Visit Has Dressing in Place as Prescribed: Yes Patient Arrived: Cane Pain Present Now: No Arrival Time: 12:10 Accompanied By: self Transfer Assistance: None Patient Identification Verified: Yes Secondary Verification Process Completed: Yes Patient Requires Transmission-Based Precautions: No Patient Has Alerts: Yes Patient Alerts: Not diabetic R ABI 1.20 TBI 1.02 L ABI 1.26 TBI 1.00 Electronic Signature(s) Signed: 04/22/2022 12:46:12 PM By: Jennifer [name], Jennifer Entered By: Jennifer [name], Jennifer on 04/22/2022 12:12:37 -------------------------------------------------------------------------------- Encounter Discharge Information Details Patient Name: Date of Service: Jennifer [name], Jennifer [name] 04/22/2022 12:00 PM Medical Record Number: [id number] Patient Account Number: [id number] Date of Birth/Sex: Treating RN: [name] (72 y.o. Jennifer [name]) [name], Jennifer Primary Care Jennifer [name]: Jennifer PlowmanArnett, Jennifer Other Clinician: Referring Jennifer [name]: Treating Jennifer [name]/Extender: Jennifer [name], Jennifer [name], Jennifer [name] in Treatment: 4 Encounter Discharge Information Items Post Procedure Vitals Discharge Condition: Stable Temperature (F): 98.2 Ambulatory Status: Cane Pulse (bpm): 67 Discharge Destination: Home Respiratory Rate (breaths/min): 18 Transportation: Private Auto Blood Pressure (mmHg): 119/63 Accompanied By: self Schedule Follow-up Appointment: Yes Clinical Summary of Care: Electronic  Signature(s) Signed: 04/22/2022 12:46:12 PM By: Jennifer [name], Jennifer Entered By: Jennifer [name], Jennifer on 04/22/2022 12:45:51 -------------------------------------------------------------------------------- Lower Extremity Assessment Details Patient Name: Date of Service: Jennifer [name], Jennifer [name] 04/22/2022 12:00 PM Medical Record Number: [id number] Patient Account Number: [id number] Date of Birth/Sex: Treating RN: [birth year][name] (72 y.o. Jennifer [name]) [name], Jennifer Primary Care Akili Cuda: Jennifer PlowmanArnett, Jennifer Other Clinician: Referring Jennifer [name]: Treating Jennifer [name]/Extender: Jennifer [name], Jennifer [name], Jennifer [name] in Treatment: 4 Edema Assessment Assessed: [Left: No] [Right: No] Edema: [Left: N] [Right: o] T[LeftPatria [name]: [name], Jennifer ([id number])] [Right: [id number]: [id number] Page 2 of 6] [Left: Vascular Assessmen] [Right: t] Left: [Left: Right] [Right: :] [Left: Pulses] [Right: :] [Left: Dorsalis Pedi] [Right: s] Palpable: [Left: Ye] [Right: s] [Left: Posterior Tibia] [Right: l] Palpable: [Left: Ye] [Right: s] Electronic Signature(s) Signed: 04/22/2022 12:46:12 PM By: Jennifer [name], Jennifer Entered By: Jennifer [name], Jennifer on 04/22/2022 12:21:18 -------------------------------------------------------------------------------- Multi Wound Chart Details Patient Name: Date of Service: Jennifer [name], Jennifer [name] 04/22/2022 12:00 PM Medical Record Number: [id number] Patient Account Number: [id number] Date of Birth/Sex: Treating RN: [birth year][name] (72 y.o. Jennifer [name]) [name], Jennifer Primary Care Mathews Stuhr: Jennifer PlowmanArnett, Jennifer Other Clinician: Referring Jennifer [name]: Treating Jennifer [name]/Extender: Jennifer [name], Jennifer [name], Jennifer [name] in Treatment: 4 Vital Signs Height(in): 61 Pulse(bpm): 67 Weight(lbs): 185 Blood Pressure(mmHg): 119/63 Body Mass Index(BMI): 35 Temperature(F): 98.2 Respiratory Rate(breaths/min): 18 [1:Photos:] [N/A:N/A] Right, Posterior Calcaneus N/A N/A Wound Location: Pressure Injury N/A N/A Wounding Event: Pressure Ulcer N/A N/A Primary  Etiology: Cataracts, Hypertension, History of N/A N/A Comorbid History: pressure wounds, Neuropathy 01/13/2022 N/A N/A Date Acquired: 4 N/A N/A [name] of Treatment: Open N/A N/A Wound Status: No N/A N/A Wound Recurrence: 1x0.5x0.1 N/A N/A Measurements L x W x D (cm) 0.393 N/A N/A A (cm) : rea 0.039 N/A N/A Volume (cm) : 16.60% N/A N/A % Reduction in A rea: 17.00% N/A N/A % Reduction in Volume: Category/Stage III N/A N/A Classification: Small N/A N/A Exudate A mount: Serous N/A N/A Exudate Type: amber N/A N/A Exudate Color: Distinct, outline attached N/A N/A Wound Margin: None Present (0%) N/A N/A Granulation A mount: Medium (34-66%) N/A N/A Necrotic A mount: Fat Layer (Subcutaneous Tissue): Yes  N/A N/A Exposed Structures: Fascia: No Tendon: No Muscle: No Joint: No Bone: No Small (1-33%) N/A N/A Epithelialization: Treatment Notes Electronic Signature(s) Signed: 04/22/2022 12:46:12 PM By: Jennifer [name], Jennifer [name] ([id number]) By: Jennifer Public [phone].pdf Page 3 of 6 Signed: 04/22/2022 12:46:12 PM Entered By: Jennifer Public on 04/22/2022 12:21:25 -------------------------------------------------------------------------------- Multi-Disciplinary Care Plan Details Patient Name: Date of Service: Jennifer [name] 04/22/2022 12:00 PM Medical Record Number: [id number] Patient Account Number: [id number] Date of Birth/Sex: Treating RN: Jennifer [name] (72 y.o. Jennifer [name], Jennifer Primary Care Jeromey Kruer: Jennifer [name] Other Clinician: Referring Delshawn Stech: Treating Laker Thompson/Extender: Jennifer Rout in Treatment: 4 Active Inactive Necrotic Tissue Nursing Diagnoses: Impaired tissue integrity related to necrotic/devitalized tissue Knowledge deficit related to management of necrotic/devitalized tissue Goals: Necrotic/devitalized tissue will be minimized in the wound bed Date Initiated: 03/29/2022 Target Resolution Date:  04/29/2022 Goal Status: Active Patient/caregiver will verbalize understanding of reason and process for debridement of necrotic tissue Date Initiated: 03/29/2022 Target Resolution Date: 04/29/2022 Goal Status: Active Interventions: Assess patient pain level pre-, during and post procedure and prior to discharge Provide education on necrotic tissue and debridement process Treatment Activities: Excisional debridement : 03/24/2022 Notes: Wound/Skin Impairment Nursing Diagnoses: Impaired tissue integrity Knowledge deficit related to ulceration/compromised skin integrity Goals: Patient will demonstrate a reduced rate of smoking or cessation of smoking Date Initiated: 03/29/2022 Target Resolution Date: 04/28/2022 Goal Status: Active Patient will have a decrease in wound volume by X% from date: (specify in notes) Date Initiated: 03/29/2022 Target Resolution Date: 04/28/2022 Goal Status: Active Patient/caregiver will verbalize understanding of skin care regimen Date Initiated: 03/29/2022 Target Resolution Date: 04/28/2022 Goal Status: Active Ulcer/skin breakdown will have a volume reduction of 30% by week 4 Date Initiated: 03/29/2022 Target Resolution Date: 04/28/2022 Goal Status: Active Ulcer/skin breakdown will have a volume reduction of 50% by week 8 Date Initiated: 03/29/2022 Target Resolution Date: 05/28/2022 Goal Status: Active Ulcer/skin breakdown will have a volume reduction of 80% by week 12 Date Initiated: 03/29/2022 Target Resolution Date: 06/28/2022 Goal Status: Active Ulcer/skin breakdown will heal within 14 [name] Date Initiated: 03/29/2022 Target Resolution Date: 07/12/2022 Goal Status: Active Interventions: Assess patient/caregiver ability to obtain necessary supplies Assess patient/caregiver ability to perform ulcer/skin care regimen upon admission and as needed Assess ulceration(s) every visit Provide education on ulcer and skin care Jennifer [name], Jennifer [name] ([id number])  [id number].pdf Page 4 of 6 Treatment Activities: Skin care regimen initiated : 03/24/2022 Notes: Electronic Signature(s) Signed: 04/22/2022 12:46:12 PM By: Jennifer Public Entered By: Jennifer Public on 04/22/2022 12:28:51 -------------------------------------------------------------------------------- Pain Assessment Details Patient Name: Date of Service: Jennifer [name] 04/22/2022 12:00 PM Medical Record Number: [id number] Patient Account Number: [id number] Date of Birth/Sex: Treating RN: [name] (72 y.o. Jennifer [name], Jennifer Primary Care Nyisha Clippard: Jennifer [name] Other Clinician: Referring Jamelah Sitzer: Treating Corleone Biegler/Extender: Jennifer Rout in Treatment: 4 Active Problems Location of Pain Severity and Description of Pain Patient Has Paino No Site Locations Pain Management and Medication Current Pain Management: Electronic Signature(s) Signed: 04/22/2022 12:46:12 PM By: Jennifer Public Entered By: Jennifer Public on 04/22/2022 12:14:17 -------------------------------------------------------------------------------- Patient/Caregiver Education Details Patient Name: Date of Service: Jennifer [name] 4/5/2024andnbsp12:00 PM Medical Record Number: [id number] Patient Account Number: [id number] Date of Birth/Gender: Treating RN: [name] (72 y.o. Jennifer [name], Jennifer Primary Care Physician: Jennifer [name] Other Clinician: Referring Physician: Treating Physician/Extender: Jennifer Rout in Treatment: 4 Education Assessment Education Provided To: Patient Education Topics Provided Offloading: Handouts: How Offloading Helps Foot Wounds [place], [name] ([id number]) [id number] Page 5 of  6 Methods: Explain/Verbal Responses: State content correctly Pressure: Handouts: Pressure Injury: Prevention and Offloading Methods: Explain/Verbal Responses: State content correctly Wound  Debridement: Handouts: Wound Debridement Methods: Explain/Verbal Responses: State content correctly Wound/Skin Impairment: Handouts: Caring for Your Ulcer Methods: Explain/Verbal Responses: State content correctly Electronic Signature(s) Signed: 04/22/2022 12:46:12 PM By: Jennifer [name], Jennifer Entered By: Jennifer [name], Jennifer on 04/22/2022 12:30:33 -------------------------------------------------------------------------------- Wound Assessment Details Patient Name: Date of Service: Jennifer [name], Jennifer [name] 04/22/2022 12:00 PM Medical Record Number: [id number] Patient Account Number: [id number] Date of Birth/Sex: Treating RN: [birth year][name] (72 y.o. Jennifer [name]) [name], Jennifer Primary Care Mariavictoria Nottingham: Jennifer PlowmanArnett, Jennifer Other Clinician: Referring Yashua Bracco: Treating Chamika Cunanan/Extender: Jennifer [name], Jennifer [name], Jennifer [name] in Treatment: 4 Wound Status Wound Number: 1 Primary Pressure Ulcer Etiology: Wound Location: Right, Posterior Calcaneus Wound Status: Open Wounding Event: Pressure Injury Comorbid Cataracts, Hypertension, History of pressure wounds, Date Acquired: 01/13/2022 History: Neuropathy [name] Of Treatment: 4 Clustered Wound: No Photos Wound Measurements Length: (cm) 0.6 Width: (cm) 0.5 Depth: (cm) 0.1 Area: (cm) 0.236 Volume: (cm) 0.024 % Reduction in Area: 49.9% % Reduction in Volume: 48.9% Epithelialization: Small (1-33%) Tunneling: No Undermining: No Wound Description Classification: Category/Stage III Wound Margin: Distinct, outline attached Exudate Amount: Small Exudate Type: Serous Exudate Color: amber Foul Odor After Cleansing: No Slough/Fibrino No Wound Bed Granulation Amount: None Present (0%) Exposed Jennifer [name], Jennifer [name] ([id number]) [id number] Page 6 of 6 Necrotic Amount: Medium (34-66%) Fascia Exposed: No Fat Layer (Subcutaneous Tissue) Exposed: Yes Tendon Exposed: No Muscle Exposed: No Joint Exposed: No Bone Exposed: No Treatment Notes Wound  #1 (Calcaneus) Wound Laterality: Right, Posterior Cleanser Soap and Water Discharge Instruction: Gently cleanse wound with antibacterial soap, rinse and pat dry prior to dressing wounds Peri-Wound Care Moisturizing Lotion Discharge Instruction: Suggestions: Theraderm, Eucerin, Cetaphil, or patient preference. Topical Primary Dressing Hydrofera Blue Ready Transfer Foam, 2.5x2.5 (in/in) Discharge Instruction: Apply Hydrofera Blue Ready to wound bed as directed Secondary Dressing Gauze Discharge Instruction: As directed: dry, moistened with saline or moistened with Dakins Solution Secured With [employer] GroupKerlix Roll Sterile or Non-Sterile 6-ply 4.5x4 (yd/yd) Discharge Instruction: Apply Kerlix as directed Compression Wrap Compression Stockings Add-Ons Electronic Signature(s) Signed: 04/22/2022 12:46:12 PM By: Jennifer [name], Jennifer Entered By: Jennifer [name], Jennifer on 04/22/2022 12:40:42 -------------------------------------------------------------------------------- Vitals Details Patient Name: Date of Service: Jennifer [name], Jennifer [facility] 04/22/2022 12:00 PM Medical Record Number: [id number] Patient Account Number: [id number] Date of Birth/Sex: Treating RN: [birth year][name] (72 y.o. Jennifer [name]) [name], Jennifer Primary Care Demonica Farrey: Jennifer PlowmanArnett, Jennifer Other Clinician: Referring Jennifer [name]: Treating Lucius Wise/Extender: Jennifer [name], Jennifer [name], Jennifer [name] in Treatment: 4 Vital Signs Time Taken: 12:13 Temperature (F): 98.2 Height (in): 61 Pulse (bpm): 67 Weight (lbs): 185 Respiratory Rate (breaths/min): 18 Body Mass Index (BMI): 35 Blood Pressure (mmHg): 119/63 Reference Range: 80 - 120 mg / dl Electronic Signature(s) Signed: 04/22/2022 12:46:12 PM By: Jennifer [name], Jennifer Entered By: Jennifer [name], Jennifer on 04/22/2022 12:14:10

## 2022-04-22 NOTE — Progress Notes (Addendum)
YONEKO, SONDAG ([id number]) [id number] Page 1 of 6 Visit Report for 04/22/2022 Chief Complaint Document Details Patient Name: Date of Service: Jennifer [name] 04/22/2022 12:00 PM Medical Record Number: [id number] Patient Account Number: [id number] Date of Birth/Sex: Treating RN: 02-02-1950 (72 y.o. Starleen Arms, Leah Primary Care Provider: [name] Other Clinician: Referring Provider: Treating Provider/Extender: [name] in Treatment: 4 Information Obtained from: Patient Chief Complaint 03/24/2022; patient is here for review of wounds on the 2 locations on the left lateral foot Electronic Signature(s) Signed: 04/22/2022 12:15:02 PM By: Lenda Kelp PA-C Entered By: Lenda Kelp on 04/22/2022 12:15:01 -------------------------------------------------------------------------------- Debridement Details Patient Name: Date of Service: Jennifer [name] [facility] 04/22/2022 12:00 PM Medical Record Number: [id number] Patient Account Number: [id number] Date of Birth/Sex: Treating RN: 1951-01-17 (72 y.o. Starleen Arms, Leah Primary Care Provider: [name] Other Clinician: Referring Provider: Treating Provider/Extender: [name] in Treatment: 4 Debridement Performed for Assessment: Wound #1 Right,Posterior Calcaneus Performed By: Physician Nelida Meuse., PA-C Debridement Type: Debridement Level of Consciousness (Pre-procedure): Awake and Alert Pre-procedure Verification/Time Out Yes - 12:24 Taken: Start Time: 12:24 Pain Control: Lidocaine 2% T opical Gel T Area Debrided (L x W): otal 0.6 (cm) x 0.5 (cm) = 0.3 (cm) Tissue and other material debrided: Viable, Non-Viable, Callus, Subcutaneous, Skin: Dermis , Biofilm Level: Skin/Subcutaneous Tissue Debridement Description: Excisional Instrument: Curette Bleeding: Minimum Hemostasis Achieved: Pressure End Time: 12:27 Procedural Pain: 0 Post  Procedural Pain: 0 Response to Treatment: Procedure was tolerated well Level of Consciousness (Post- Awake and Alert procedure): Post Debridement Measurements of Total Wound Length: (cm) 0.6 Stage: Category/Stage III Width: (cm) 0.5 Depth: (cm) 0.1 Volume: (cm) 0.024 Character of Wound/Ulcer Post Debridement: Improved Post Procedure Diagnosis Same as Pre-procedure Electronic Signature(s) Signed: 04/22/2022 12:46:12 PM By: Bonnell Public Signed: 04/22/2022 1:34:21 PM By: Lenda Kelp PA-C Entered By: Bonnell Public on 04/22/2022 12:41:00 Jennifer [name] ([id number]) [id number] Page 2 of 6 -------------------------------------------------------------------------------- HPI Details Patient Name: Date of Service: Jennifer [name] 04/22/2022 12:00 PM Medical Record Number: [id number] Patient Account Number: [id number] Date of Birth/Sex: Treating RN: November 24, 1950 (72 y.o. Starleen Arms, Leah Primary Care Provider: [name] Other Clinician: Referring Provider: Treating Provider/Extender: [name] in Treatment: 4 History of Present Illness HPI Description: ADMISSION 03/24/2022 This is a 72 year old woman who has had 2 small open areas on the right lateral foot. She is not a diabetic but does have peripheral neuropathy. She was seen by [facility] yesterday. ABIs w She has not had previous wounds in this area. ABI 1.20 on the right and TBI at 1.02. On the left 1.23 and 1.00 on the left Past medical history; is not noted not a diabetic. She has gastroesophageal reflux disease, high cholesterol, hyperlipidemia, hypertension, hypothyroidism, sleep apnea 04-07-2022 upon evaluation today patient appears to be doing well currently in regard to her wound. The lateral portion of her foot appears to be completely healed which is great news and I am very pleased in that regard. In regard to the medial portion of her foot and  the heel location this is still open and does appear to need some sharp debridement today. I discussed with her today that we are going to proceed with debridement at this point. She is in agreement with that plan. 04-14-2022 upon evaluation today patient appears to be doing well currently in regard to her wound. She has been tolerating the dressing changes without complication and fortunately I  do not see any signs of active infection locally nor systemically at this time. She did have some area of dark discoloration where I used a silver nitrate last week because of this she felt like that the [facility] was doing worse for her and she quit using it just using Neosporin. With that being said right now I think she really needs to get back to using the [facility] and she needs to be using offloading shoe. 04-22-2022 upon evaluation today patient appears to be doing well currently in regard to the wounds on her heel which are showing signs of improvement and the actual wound is measuring smaller she does have some irritation around the wound, cleaned away some of this callus today. Fortunately I do not see any evidence of active infection locally nor systemically which is great news. Electronic Signature(s) Signed: 04/22/2022 1:08:11 PM By: Lenda Kelp PA-C Entered By: Lenda Kelp on 04/22/2022 13:08:10 -------------------------------------------------------------------------------- Physical Exam Details Patient Name: Date of Service: Jennifer [name] 04/22/2022 12:00 PM Medical Record Number: [id number] Patient Account Number: [id number] Date of Birth/Sex: Treating RN: 1951-01-09 (72 y.o. Starleen Arms, Leah Primary Care Provider: [name] Other Clinician: Referring Provider: Treating Provider/Extender: [name] in Treatment: 4 Constitutional Well-nourished and well-hydrated in no acute distress. Respiratory normal breathing without  difficulty. Psychiatric this patient is able to make decisions and demonstrates good insight into disease process. Alert and Oriented x 3. pleasant and cooperative. Notes Upon inspection patient's wound bed actually showed signs of good granulation and epithelization at this point. I am actually very pleased with where we stand I think that she is making good progress and in general I do believe that we are headed in the right direction here. I did perform debridement clearway some of the necrotic debris she tolerated that today without complication postdebridement wound bed is significantly improved. Electronic Signature(s) Signed: 04/22/2022 1:08:35 PM By: Lenda Kelp PA-C Entered By: Lenda Kelp on 04/22/2022 13:08:35 Physician Orders Details -------------------------------------------------------------------------------- Jennifer [name] ([id number]) [id number] Page 3 of 6 Patient Name: Date of Service: Jennifer [name] 04/22/2022 12:00 PM Medical Record Number: [id number] Patient Account Number: [id number] Date of Birth/Sex: Treating RN: [birth year] (72 y.o. Starleen Arms, Leah Primary Care Provider: [name] Other Clinician: Referring Provider: Treating Provider/Extender: [name] in Treatment: 4 Verbal / Phone Orders: No Diagnosis Coding ICD-10 Coding Code Description G90.09 Other idiopathic peripheral autonomic neuropathy L97.528 Non-pressure chronic ulcer of other part of left foot with other specified severity Follow-up Appointments Return Appointment in 1 week. Bathing/ [employer] wounds with antibacterial soap and water. Off-Loading Heel suspension boot Wound Treatment Wound #1 - Calcaneus Wound Laterality: Right, Posterior Cleanser: Soap and Water 3 x Per Week/30 Days Discharge Instructions: Gently cleanse wound with antibacterial soap, rinse and pat dry prior to dressing wounds Peri-Wound  Care: Moisturizing Lotion 3 x Per Week/30 Days Discharge Instructions: Suggestions: Theraderm, Eucerin, Cetaphil, or patient preference. Prim Dressing: Hydrofera Blue Ready Transfer Foam, 2.5x2.5 (in/in) (Generic) 3 x Per Week/30 Days ary Discharge Instructions: Apply Hydrofera Blue Ready to wound bed as directed Secondary Dressing: Gauze 3 x Per Week/30 Days Discharge Instructions: As directed: dry, moistened with saline or moistened with Dakins Solution Secured With: [employer] Sterile or Non-Sterile 6-ply 4.5x4 (yd/yd) 3 x Per Week/30 Days Discharge Instructions: Apply Kerlix as directed Electronic Signature(s) Signed: 04/22/2022 12:46:12 PM By: Bonnell Public Signed: 04/22/2022 1:34:21 PM By: Lenda Kelp PA-C Entered  ByBonnell Public on 04/22/2022 12:37:07 -------------------------------------------------------------------------------- Problem List Details Patient Name: Date of Service: Jennifer [name] 04/22/2022 12:00 PM Medical Record Number: [id number] Patient Account Number: [id number] Date of Birth/Sex: Treating RN: May 05, 1950 (72 y.o. Starleen Arms, Leah Primary Care Provider: [name] Other Clinician: Referring Provider: Treating Provider/Extender: [name] in Treatment: 4 Active Problems ICD-10 Encounter Code Description Active Date MDM Diagnosis G90.09 Other idiopathic peripheral autonomic neuropathy 03/24/2022 No Yes L97.528 Non-pressure chronic ulcer of other part of left foot with other specified 03/24/2022 No Yes severity ARLETA, OSTRUM ([id number]) [id number] Page 4 of 6 Inactive Problems Resolved Problems Electronic Signature(s) Signed: 04/22/2022 12:14:57 PM By: Lenda Kelp PA-C Entered By: Lenda Kelp on 04/22/2022 12:14:57 -------------------------------------------------------------------------------- Progress Note Details Patient Name: Date of Service: Jennifer [name] [facility] 04/22/2022  12:00 PM Medical Record Number: [id number] Patient Account Number: [id number] Date of Birth/Sex: Treating RN: [birth year] (72 y.o. Starleen Arms, Leah Primary Care Provider: [name] Other Clinician: Referring Provider: Treating Provider/Extender: [name] in Treatment: 4 Subjective Chief Complaint Information obtained from Patient 03/24/2022; patient is here for review of wounds on the 2 locations on the left lateral foot History of Present Illness (HPI) ADMISSION 03/24/2022 This is a 72 year old woman who has had 2 small open areas on the right lateral foot. She is not a diabetic but does have peripheral neuropathy. She was seen by [facility] yesterday. ABIs w She has not had previous wounds in this area. ABI 1.20 on the right and TBI at 1.02. On the left 1.23 and 1.00 on the left Past medical history; is not noted not a diabetic. She has gastroesophageal reflux disease, high cholesterol, hyperlipidemia, hypertension, hypothyroidism, sleep apnea 04-07-2022 upon evaluation today patient appears to be doing well currently in regard to her wound. The lateral portion of her foot appears to be completely healed which is great news and I am very pleased in that regard. In regard to the medial portion of her foot and the heel location this is still open and does appear to need some sharp debridement today. I discussed with her today that we are going to proceed with debridement at this point. She is in agreement with that plan. 04-14-2022 upon evaluation today patient appears to be doing well currently in regard to her wound. She has been tolerating the dressing changes without complication and fortunately I do not see any signs of active infection locally nor systemically at this time. She did have some area of dark discoloration where I used a silver nitrate last week because of this she felt like that the [facility] was doing worse for her and she  quit using it just using Neosporin. With that being said right now I think she really needs to get back to using the [facility] and she needs to be using offloading shoe. 04-22-2022 upon evaluation today patient appears to be doing well currently in regard to the wounds on her heel which are showing signs of improvement and the actual wound is measuring smaller she does have some irritation around the wound, cleaned away some of this callus today. Fortunately I do not see any evidence of active infection locally nor systemically which is great news. Objective Constitutional Well-nourished and well-hydrated in no acute distress. Vitals Time Taken: 12:13 PM, Height: 61 in, Weight: 185 lbs, BMI: 35, Temperature: 98.2 F, Pulse: 67 bpm, Respiratory Rate: 18 breaths/min, Blood Pressure: 119/63 mmHg. Respiratory normal breathing without  difficulty. Psychiatric this patient is able to make decisions and demonstrates good insight into disease process. Alert and Oriented x 3. pleasant and cooperative. General Notes: Upon inspection patient's wound bed actually showed signs of good granulation and epithelization at this point. I am actually very pleased with where we stand I think that she is making good progress and in general I do believe that we are headed in the right direction here. I did perform debridement clearway some of the necrotic debris she tolerated that today without complication postdebridement wound bed is significantly improved. Integumentary (Hair, Skin) Wound #1 status is Open. Original cause of wound was Pressure Injury. The date acquired was: 01/13/2022. The wound has been in treatment 4 weeks. The Jennifer CorinROLLINGER, Jennifer ([id number]) [id number] Page 5 of 6 wound is located on the Right,Posterior Calcaneus. The wound measures 0.6cm length x 0.5cm width x 0.1cm depth; 0.236cm^2 area and 0.024cm^3 volume. There is Fat Layer (Subcutaneous Tissue) exposed. There  is no tunneling or undermining noted. There is a small amount of serous drainage noted. The wound margin is distinct with the outline attached to the wound base. There is no granulation within the wound bed. There is a medium (34-66%) amount of necrotic tissue within the wound bed. Assessment Active Problems ICD-10 Other idiopathic peripheral autonomic neuropathy Non-pressure chronic ulcer of other part of left foot with other specified severity Procedures Wound #1 Pre-procedure diagnosis of Wound #1 is a Pressure Ulcer located on the Right,Posterior Calcaneus . There was a Excisional Skin/Subcutaneous Tissue Debridement with a total area of 0.3 sq cm performed by Nelida MeuseStone, Paulette Lynch E., PA-C. With the following instrument(s): Curette to remove Viable and Non-Viable tissue/material. Material removed includes Callus, Subcutaneous Tissue, Skin: Dermis, and Biofilm after achieving pain control using Lidocaine 2% Topical Gel. No specimens were taken. A time out was conducted at 12:24, prior to the start of the procedure. A Minimum amount of bleeding was controlled with Pressure. The procedure was tolerated well with a pain level of 0 throughout and a pain level of 0 following the procedure. Post Debridement Measurements: 0.6cm length x 0.5cm width x 0.1cm depth; 0.024cm^3 volume. Post debridement Stage noted as Category/Stage III. Character of Wound/Ulcer Post Debridement is improved. Post procedure Diagnosis Wound #1: Same as Pre-Procedure Plan Follow-up Appointments: Return Appointment in 1 week. Bathing/ Shower/ Hygiene: Wash wounds with antibacterial soap and water. Off-Loading: Heel suspension boot WOUND #1: - Calcaneus Wound Laterality: Right, Posterior Cleanser: Soap and Water 3 x Per Week/30 Days Discharge Instructions: Gently cleanse wound with antibacterial soap, rinse and pat dry prior to dressing wounds Peri-Wound Care: Moisturizing Lotion 3 x Per Week/30 Days Discharge Instructions:  Suggestions: Theraderm, Eucerin, Cetaphil, or patient preference. Prim Dressing: Hydrofera Blue Ready Transfer Foam, 2.5x2.5 (in/in) (Generic) 3 x Per Week/30 Days ary Discharge Instructions: Apply Hydrofera Blue Ready to wound bed as directed Secondary Dressing: Gauze 3 x Per Week/30 Days Discharge Instructions: As directed: dry, moistened with saline or moistened with Dakins Solution Secured With: [employer] Sterile or Non-Sterile 6-ply 4.5x4 (yd/yd) 3 x Per Week/30 Days Discharge Instructions: Apply Kerlix as directed 1. I would recommend that we have the patient continue to monitor for any signs of worsening or infection. Based on what I am seeing I do think that she is making excellent progress and very pleased with what we are seeing I do think that the overall size of the wound is smaller. 2. Regarding continue with [facility] she also states that she is not sure  she actually got Hydrofera Blue and her dressing supplies sent therefore I would like to see what she did get she is given bring that in to have us look at it. We will see patient back for reevaluation in 1 week here in the clinic. If anything worsens or changes patient will contact our office for additional recommendations. Electronic Signature(s) Signed: 04/22/2022 1:09:07 PM By: Lenda KelpStone III, Doralee Kocak PA-C Entered By: Lenda KelpStone III, Kyeisha Janowicz on 04/22/2022 13:09:07 -------------------------------------------------------------------------------- SuperBill Details Patient Name: Date of Service: [facility]/22/2022 Medical Record Number: [id number] Patient Account Number: [id number] Date of Birth/Sex: Treating RN: 03-11-1950 (72 y.o. [name]) Coulter, Leah Primary Care Provider: Rennie PlowmanArnett, Margaret Other Clinician: Referring Provider: Treating Provider/Extender: Jearld ShinesStone, Chayim Bialas Arnett, Margaret [place], [place] ([id number]) [id number] Page 6 of 6 Weeks in Treatment: 4 Diagnosis Coding ICD-10 Codes Code  Description G90.09 Other idiopathic peripheral autonomic neuropathy L97.528 Non-pressure chronic ulcer of other part of left foot with other specified severity Facility Procedures : CPT4 Code: [id number] Description: 11042 - DEB SUBQ TISSUE 20 SQ CM/< ICD-10 Diagnosis Description L97.528 Non-pressure chronic ulcer of other part of left foot with other specified seve Modifier: rity Quantity: 1 Physician Procedures : CPT4 Code Description Modifier [id number] 11042 - WC PHYS SUBQ TISS 20 SQ CM ICD-10 Diagnosis Description L97.528 Non-pressure chronic ulcer of other part of left foot with other specified severity Quantity: 1 Electronic Signature(s) Signed: 04/22/2022 1:09:19 PM By: Lenda KelpStone III, Shavy Beachem PA-C Entered By: Lenda KelpStone III, Syair Fricker on 04/22/2022 13:09:19

## 2022-04-25 ENCOUNTER — Ambulatory Visit: Payer: Medicare Other

## 2022-04-25 DIAGNOSIS — R262 Difficulty in walking, not elsewhere classified: Secondary | ICD-10-CM | POA: Diagnosis not present

## 2022-04-25 DIAGNOSIS — R2681 Unsteadiness on feet: Secondary | ICD-10-CM

## 2022-04-25 DIAGNOSIS — M5416 Radiculopathy, lumbar region: Secondary | ICD-10-CM

## 2022-04-25 DIAGNOSIS — M5431 Sciatica, right side: Secondary | ICD-10-CM

## 2022-04-25 DIAGNOSIS — M5459 Other low back pain: Secondary | ICD-10-CM

## 2022-04-25 DIAGNOSIS — M6281 Muscle weakness (generalized): Secondary | ICD-10-CM

## 2022-04-25 NOTE — Therapy (Signed)
OUTPATIENT PHYSICAL THERAPY TREATMENT NOTE   Patient Name: Jennifer [name] MRN: [id number] DOB:12-14-1950, 72 y.o., female Today's Date: 04/25/2022  PCP: Allegra GranaArnett, Margaret G, FNP  REFERRING PROVIDER: Venetia NightYarbrough, Chester, MD   END OF SESSION:  PT End of Session - 04/25/22 0934     Visit Number 2    Number of Visits 17    Date for PT Re-Evaluation 06/16/22    PT Start Time 0934    PT Stop Time 1015    PT Time Calculation (min) 41 min    Equipment Utilized During Treatment --    Activity Tolerance Patient tolerated treatment well    Behavior During Therapy WFL for tasks assessed/performed             Past Medical History:  Diagnosis Date   Anemia    Anxiety    Bariatric surgery status    Complication of anesthesia    Diificulty breathing for about 15 minutes after bariatric surgery   Constipation    COVID-19    Dysrhythmia    Elevated liver enzymes    GERD (gastroesophageal reflux disease)    Hemorrhoids    Herpes genitalis    High cholesterol    Hyperlipidemia    Hypertension    Hypothyroidism    IDA (iron deficiency anemia) 02/09/2021   Neuropathy    Osteoarthritis    Sleep apnea    CPAP   Past Surgical History:  Procedure Laterality Date   ABDOMINAL HYSTERECTOMY     total for fibroids no h/o abnormal pap   bariatric sleeve  2015   BREAST EXCISIONAL BIOPSY Left 1998   carpal tunnel repair     CATARACT EXTRACTION W/PHACO Right 11/01/2021   Procedure: CATARACT EXTRACTION PHACO AND INTRAOCULAR LENS PLACEMENT (IOC) RIGHT;  Surgeon: Nevada CraneKing, Bradley Mark, MD;  [facility];  Service: Ophthalmology;  Laterality: Right;  sleep apnea 4.95 00:49.7   CATARACT EXTRACTION W/PHACO Left 11/15/2021   Procedure: CATARACT EXTRACTION PHACO AND INTRAOCULAR LENS PLACEMENT (IOC) LEFT 4.94 00:32.3;  Surgeon: Nevada CraneKing, Bradley Mark, MD;  [facility];  Service: Ophthalmology;  Laterality: Left;  sleep apnea   COLONOSCOPY WITH PROPOFOL N/A  02/11/2016   Procedure: COLONOSCOPY WITH PROPOFOL;  Surgeon: Wyline MoodKiran Anna, MD;  Location: [facility] ENDOSCOPY;  Service: Endoscopy;  Laterality: N/A;   COLONOSCOPY WITH PROPOFOL N/A 10/01/2019   Procedure: COLONOSCOPY WITH PROPOFOL;  Surgeon: Wyline MoodAnna, Kiran, MD;  [facility];  Service: Gastroenterology;  Laterality: N/A;   COLONOSCOPY WITH PROPOFOL N/A 11/19/2020   Procedure: COLONOSCOPY WITH PROPOFOL;  Surgeon: Wyline MoodAnna, Kiran, MD;  [facility];  Service: Gastroenterology;  Laterality: N/A;   ESOPHAGOGASTRODUODENOSCOPY (EGD) WITH PROPOFOL N/A 12/02/2019   Procedure: ESOPHAGOGASTRODUODENOSCOPY (EGD) WITH PROPOFOL;  Surgeon: Wyline MoodAnna, Kiran, MD;  [facility];  Service: Gastroenterology;  Laterality: N/A;   ESOPHAGOGASTRODUODENOSCOPY (EGD) WITH PROPOFOL N/A 11/19/2020   Procedure: ESOPHAGOGASTRODUODENOSCOPY (EGD) WITH PROPOFOL;  Surgeon: Wyline MoodAnna, Kiran, MD;  [facility];  Service: Gastroenterology;  Laterality: N/A;   FLEXIBLE SIGMOIDOSCOPY N/A 02/06/2021   Procedure: FLEXIBLE SIGMOIDOSCOPY;  Surgeon: Jaynie Collinsusso, Steven Michael, DO;  [facility];  Service: Gastroenterology;  Laterality: N/A;   GIVENS CAPSULE STUDY N/A 06/07/2021   Procedure: GIVENS CAPSULE STUDY;  Surgeon: Toney ReilVanga, Rohini Reddy, MD;  [facility];  Service: Gastroenterology;  Laterality: N/A;   GIVENS CAPSULE STUDY N/A 06/09/2021   Procedure: GIVENS CAPSULE STUDY;  Surgeon: Toney ReilVanga, Rohini Reddy, MD;  [facility];  Service: Gastroenterology;  Laterality: N/A;   HEMORRHOID SURGERY  LAPAROSCOPIC GASTRIC RESTRICTIVE DUODENAL PROCEDURE (DUODENAL SWITCH) Bilateral    2020   Patient Active Problem List   Diagnosis Date Noted   Transaminitis 11/22/2021   Tinea pedis 08/31/2021   Upper GI bleed 06/06/2021   Alcohol abuse 06/06/2021   Eye discharge 02/16/2021   IDA (iron deficiency anemia) 02/09/2021   Acute blood loss anemia 02/06/2021   Elevated liver function tests     Bright red blood per rectum 02/05/2021   Open wound of left heel 01/20/2021   Anxiety and depression 11/25/2020   CPAP (continuous positive airway pressure) dependence 11/25/2020   Sinusitis 11/25/2020   Allergic conjunctivitis of both eyes and rhinitis 11/25/2020   GI bleed 11/17/2020   Chronic iron deficiency anemia    Symptomatic anemia 11/11/2020   Postop check 08/07/2020   Biliary sludge determined by ultrasound 07/02/2020   Right upper quadrant abdominal pain 06/10/2020   Low back pain 05/18/2020   Muscle spasm 05/18/2020   Bilateral tinnitus 04/30/2020   Irregular heartbeat 02/25/2020   Diabetes mellitus without complication 02/25/2020   Strain of right knee 02/10/2020   Aspirin long-term use 10/22/2019   Wound ballistics 08/29/2019   Displacement of lumbar intervertebral disc without myelopathy 08/29/2019   Helicobacter pylori gastrointestinal tract infection 08/29/2019   Hypercholesterolemia 08/29/2019   Phlebitis after infusion 06/26/2019   Superficial thrombophlebitis of left upper extremity 06/26/2019   BMI 38.0-38.9,adult 06/19/2019   Small bowel obstruction 05/25/2019   Morbid obesity 05/16/2019   Cervical pain (neck) 04/09/2019   Pedal edema 03/06/2019   Pre-operative clearance 03/06/2019   History of sleeve gastrectomy 01/23/2019   Chronic pain syndrome 10/03/2018   Obstructive sleep apnea 10/03/2018   Right shoulder pain 06/08/2018   Chronic venous insufficiency 05/24/2018   Lymphedema 05/24/2018   Lumbar spondylosis 05/17/2018   Leg swelling 05/09/2018   Neuropathy 01/24/2018   Insomnia 07/12/2017   Fracture of phalanx of finger 05/31/2017   Impingement syndrome of shoulder region 05/31/2017   Hemorrhoids 12/15/2016   Hypothyroidism 10/03/2016   Chronic shoulder bursitis 08/30/2016   Positive ANA (antinuclear antibody) 08/30/2016   Bradycardia 06/16/2016   History of adenomatous polyp of colon 05/02/2016   Elevated liver enzymes 04/06/2016   OSA  (obstructive sleep apnea) 04/06/2016   Bilateral shoulder pain 01/13/2016   Fatty liver 12/08/2015   Arthritis 12/08/2015   History of bariatric surgery 12/07/2015   Genital herpes 11/11/2015   Hyperlipidemia 11/11/2015   Essential hypertension 11/11/2015   Routine physical examination 11/11/2015   Allergic rhinitis 11/11/2015   GERD (gastroesophageal reflux disease) 11/11/2015   Herpesviral infection, unspecified 01/08/2014   Disorder of thyroid, unspecified 01/08/2014   Lumbar radiculitis 11/19/2013   Neuritis or radiculitis due to rupture of lumbar intervertebral disc 11/19/2013   Monilial vaginitis 08/20/2013   Gastritis and duodenitis 07/25/2013   Impaired fasting glucose 07/17/2012   Obesity, unspecified 07/17/2012   Obesity 07/17/2012   Depression 03/15/2012   Abnormal electrocardiogram (ECG) (EKG) 03/06/2012   URI (upper respiratory infection) 01/16/2012    REFERRING DIAG: M54.41,G89.29 (ICD-10-CM) - Chronic right-sided low back pain with right-sided sciatica M43.10 (ICD-10-CM) - Anterolisthesis   THERAPY DIAG:  Other low back pain  Radiculopathy, lumbar region  Sciatica, right side  Muscle weakness (generalized)  Difficulty in walking, not elsewhere classified  Unsteadiness on feet  Rationale for Evaluation and Treatment Rehabilitation  PERTINENT HISTORY: Low back pain with R LE sciatica. PT states having R LE neuropathy. Has a R heel wound and currently wearing an orthotic shoe  which makes her feel off balance. Usually off balance as well without the orthotic shoe due to R LE neuropathy. R LE symptoms along the L5 dermatome. Has felt R thigh numbness since having surgery in which an intsrument went up her R thigh last year. Also has R LE symptoms that shoots all the way down to her R foot (along the L 5 dermatome). Has central low back pain which began about 20 years ago, gradual onset. Denies loss of bowel or bladder control or saddle anesthesia. Pain has worsend  since onset.   PRECAUTIONS:  Fall risk        SUBJECTIVE:   SUBJECTIVE STATEMENT: Back is probably about the same. Felt 5-6/10 back pain during the weekend. 3/10 low back strain currently (pt sitting on a chair. Took some Tylenol earlier. 4-5/10 low back pain when walking.   PAIN:  Are you having pain? See subjective   TODAY'S TREATMENT:                                                                                                                                         DATE: 04/25/2022  Larey Seat last year a few times while walking. Slipped on snow on the grass one time, and the other time, pt was walking too fast, lost her balance, and the next thing she knows, she was on the floor.    Pt was told to have arthritis in her neck and low back.      No latex allergies Blood pressure is controlled per pt.    L to R pressure to L thoracic convexity decreased low back pressure in standing.    Gait: unsteady. Pt states having a SPC in the car.    Manual therapy  Seated STM L upper trap area to promote scapular retraction, therefore thoracic extension to decrease stress to low back      Therapeutic exercise  Seated B scapular retraction 2x5 seconds. To promote thoracic extension. L upper trap/C5 dermatome area discomfort  Seated R scapular retraction 4x5 seconds   R shoulder soreness  Seated transversus abdominis contraction 10x5 seconds for 3 sets  Seated glute sets 10x5 seconds for 2 sets  No back pain in sitting after aforementioned exercises.    Seated L to R pressure to L thoracic convexity with transversus abdominis contraction 5x5 seconds   Seated gentle manually resisted R lateral shift isometrics 10x5 seconds, then 8x5 seconds   More neutral spine observed during exercise.     Improved exercise technique, movement at target joints, use of target muscles after mod verbal, visual, tactile cues.         Response to treatment Pt tolerated session well without  aggravation of symptoms. No back pain reported after session.        Clinical impression Worked on trunk and glute strength to decrease pressure to low back. No back pain reported after  session. Pt will benefit from continued skilled physical therapy services to decrease pain, improve strength, balance, and function.       PATIENT EDUCATION: Education details: there-ex, HEP Person educated: Patient Education method: Explanation, Demonstration, Tactile cues, Verbal cues, and Handouts Education comprehension: verbalized understanding and returned demonstration  HOME EXERCISE PROGRAM: Access Code: [email or web address] URL: [email or web address] Date: 04/25/2022 Prepared by: Loralyn Freshwater  Exercises - Seated Transversus Abdominis Bracing  - 3 x daily - 7 x weekly - 3 sets - 10 reps - 5 seconds  hold - Seated Gluteal Sets  - 3 x daily - 7 x weekly - 3 sets - 10 reps - 5 seconds  hold     PT Short Term Goals - 04/20/22 1056       PT SHORT TERM GOAL #1   Title Pt will be independent with her initial HEP to improve strength, decrease pain, and improve ability to ambulate, perform standing and lifting tasks more comfortably for her back and with less difficulty.    Time 3    Period Weeks    Status New    Target Date 05/12/22              PT Long Term Goals - 04/20/22 1057       PT LONG TERM GOAL #1   Title Pt will have a decrease in low back pain to 5/10 or less at worst to promote ability to ambulate, perform standing and lifting tasks more comfortably for her back and with less difficulty.    Baseline 10/10 low back pain at worst for the past 3 months (04/20/2022)    Time 8    Period Weeks    Status New    Target Date 06/16/22      PT LONG TERM GOAL #2   Title Pt will have a decrease in R LE pain to 3/10 or less at worst to promote ability to ambulate, perform standing and lifting tasks more comfortably and with less difficulty.    Baseline 7/10 R LE pain at worst  for the past 3 months (04/20/2022)    Time 8    Period Weeks    Status New    Target Date 06/16/22      PT LONG TERM GOAL #3   Title Pt will improve bilateral hip extension and abduction strength by at least 1/2 MMT grade to promote ability to ambulate, perform standing tasks with less difficulty and less low back pain.    Baseline Seated manually resisted hip extension 3+/5 R, 4-/5 L, hip abduction 4/5 R, 4/5 L (04/20/2022)    Time 8    Period Weeks    Status New    Target Date 06/16/22      PT LONG TERM GOAL #4   Title Pt will improve her lumbar spine FOTO score by at least 10 points as a demonstration of improved function.    Baseline Lumbar spine FOTO 31 (04/20/2022)    Time 8    Period Weeks    Status New    Target Date 06/16/22              Plan - 04/25/22 0931     Clinical Impression Statement Worked on trunk and glute strength to decrease pressure to low back. No back pain reported after session. Pt will benefit from continued skilled physical therapy services to decrease pain, improve strength, balance, and function.    Personal Factors and Comorbidities Age;Comorbidity 3+;Fitness;Past/Current Experience;Time since onset of  injury/illness/exacerbation    Comorbidities Anxiety, HTN, neuropathy, osteoarthritis, sleep apnea, R heel wound, hx of bariatric surgery    Examination-Activity Limitations Bed Mobility;Bathing;Reach Overhead;Stairs;Dressing;Stand;Bend;Hygiene/Grooming;Sit;Caring for Others;Lift;Sleep;Transfers;Carry;Locomotion Level;Squat    Examination-Participation Restrictions Yard Work;Laundry;Cleaning    Stability/Clinical Decision Making Evolving/Moderate complexity   pt states pain has worsened since onset   Rehab Potential Fair    PT Frequency 2x / week    PT Duration 8 weeks    PT Treatment/Interventions Therapeutic exercise;Therapeutic activities;Functional mobility training;Balance training;Neuromuscular re-education;Patient/family education;Manual  techniques;Dry needling;Aquatic Therapy;Electrical Stimulation;Iontophoresis 4mg /ml Dexamethasone;Gait training;Stair training    PT Next Visit Plan posture, thoracic extension, scapular, trunk, glute strengthening, lumbopelvic control, balance, manual techniques, modalities PRN    Consulted and Agree with Plan of Care Patient              Loralyn Freshwater PT, DPT  04/25/2022, 12:43 PM

## 2022-04-26 ENCOUNTER — Other Ambulatory Visit: Payer: Self-pay | Admitting: Family

## 2022-04-26 ENCOUNTER — Telehealth: Payer: Self-pay | Admitting: Family

## 2022-04-26 DIAGNOSIS — Z0000 Encounter for general adult medical examination without abnormal findings: Secondary | ICD-10-CM

## 2022-04-26 NOTE — Telephone Encounter (Signed)
LVM to call back to schedule appt with Margaret 

## 2022-04-26 NOTE — Telephone Encounter (Signed)
Pt returned [facility] CMA call. Note below was read to pt. Pt its booked for 4/12 with Arnett.

## 2022-04-26 NOTE — Telephone Encounter (Signed)
Pt called in staying that she's having some issues with med gabapentin (NEURONTIN) 600 MG tablet. As per pt, twice @ day its not working for her, and she would like to adjusted. Pt would like to f/u with Therapist, music.

## 2022-04-26 NOTE — Telephone Encounter (Signed)
Noted  

## 2022-04-28 ENCOUNTER — Ambulatory Visit: Payer: Medicare Other

## 2022-04-28 ENCOUNTER — Telehealth: Payer: Self-pay | Admitting: *Deleted

## 2022-04-28 DIAGNOSIS — M5431 Sciatica, right side: Secondary | ICD-10-CM | POA: Diagnosis not present

## 2022-04-28 DIAGNOSIS — M6281 Muscle weakness (generalized): Secondary | ICD-10-CM

## 2022-04-28 DIAGNOSIS — M5416 Radiculopathy, lumbar region: Secondary | ICD-10-CM

## 2022-04-28 DIAGNOSIS — R2681 Unsteadiness on feet: Secondary | ICD-10-CM | POA: Diagnosis not present

## 2022-04-28 DIAGNOSIS — R262 Difficulty in walking, not elsewhere classified: Secondary | ICD-10-CM | POA: Diagnosis not present

## 2022-04-28 DIAGNOSIS — M5459 Other low back pain: Secondary | ICD-10-CM

## 2022-04-28 NOTE — Telephone Encounter (Signed)
Patient called asking if she can come in for a lab check to see if she needs to have an infusion. She reports that she is feeling really tired like she does when it is time for an infusion. Please advise

## 2022-04-28 NOTE — Therapy (Signed)
OUTPATIENT PHYSICAL THERAPY TREATMENT NOTE   Patient Name: Jennifer [name] MRN: [id number] DOB:20-Oct-1950, 72 y.o., female Today's Date: 04/28/2022  PCP: Allegra GranaArnett, Margaret G, FNP  REFERRING PROVIDER: Venetia NightYarbrough, Chester, MD   END OF SESSION:  PT End of Session - 04/28/22 0803     Visit Number 3    Number of Visits 17    Date for PT Re-Evaluation 06/16/22    PT Start Time 0803    PT Stop Time 0845    PT Time Calculation (min) 42 min    Activity Tolerance Patient tolerated treatment well    [facility] tasks assessed/performed              Past Medical History:  Diagnosis Date   Anemia    Anxiety    Bariatric surgery status    Complication of anesthesia    Diificulty breathing for about 15 minutes after bariatric surgery   Constipation    COVID-19    Dysrhythmia    Elevated liver enzymes    GERD (gastroesophageal reflux disease)    Hemorrhoids    Herpes genitalis    High cholesterol    Hyperlipidemia    Hypertension    Hypothyroidism    IDA (iron deficiency anemia) 02/09/2021   Neuropathy    Osteoarthritis    Sleep apnea    CPAP   Past Surgical History:  Procedure Laterality Date   ABDOMINAL HYSTERECTOMY     total for fibroids no h/o abnormal pap   bariatric sleeve  2015   BREAST EXCISIONAL BIOPSY Left 1998   carpal tunnel repair     CATARACT EXTRACTION W/PHACO Right 11/01/2021   Procedure: CATARACT EXTRACTION PHACO AND INTRAOCULAR LENS PLACEMENT (IOC) RIGHT;  Surgeon: Nevada CraneKing, Bradley Mark, MD;  [facility];  Service: Ophthalmology;  Laterality: Right;  sleep apnea 4.95 00:49.7   CATARACT EXTRACTION W/PHACO Left 11/15/2021   Procedure: CATARACT EXTRACTION PHACO AND INTRAOCULAR LENS PLACEMENT (IOC) LEFT 4.94 00:32.3;  Surgeon: Nevada CraneKing, Bradley Mark, MD;  [facility];  Service: Ophthalmology;  Laterality: Left;  sleep apnea   COLONOSCOPY WITH PROPOFOL N/A 02/11/2016   Procedure: COLONOSCOPY WITH  PROPOFOL;  Surgeon: Wyline MoodKiran Anna, MD;  Location: [facility] ENDOSCOPY;  Service: Endoscopy;  Laterality: N/A;   COLONOSCOPY WITH PROPOFOL N/A 10/01/2019   Procedure: COLONOSCOPY WITH PROPOFOL;  Surgeon: Wyline MoodAnna, Kiran, MD;  [facility];  Service: Gastroenterology;  Laterality: N/A;   COLONOSCOPY WITH PROPOFOL N/A 11/19/2020   Procedure: COLONOSCOPY WITH PROPOFOL;  Surgeon: Wyline MoodAnna, Kiran, MD;  [facility];  Service: Gastroenterology;  Laterality: N/A;   ESOPHAGOGASTRODUODENOSCOPY (EGD) WITH PROPOFOL N/A 12/02/2019   Procedure: ESOPHAGOGASTRODUODENOSCOPY (EGD) WITH PROPOFOL;  Surgeon: Wyline MoodAnna, Kiran, MD;  [facility];  Service: Gastroenterology;  Laterality: N/A;   ESOPHAGOGASTRODUODENOSCOPY (EGD) WITH PROPOFOL N/A 11/19/2020   Procedure: ESOPHAGOGASTRODUODENOSCOPY (EGD) WITH PROPOFOL;  Surgeon: Wyline MoodAnna, Kiran, MD;  [facility];  Service: Gastroenterology;  Laterality: N/A;   FLEXIBLE SIGMOIDOSCOPY N/A 02/06/2021   Procedure: FLEXIBLE SIGMOIDOSCOPY;  Surgeon: Jaynie Collinsusso, Steven Michael, DO;  [facility];  Service: Gastroenterology;  Laterality: N/A;   GIVENS CAPSULE STUDY N/A 06/07/2021   Procedure: GIVENS CAPSULE STUDY;  Surgeon: Toney ReilVanga, Rohini Reddy, MD;  [facility];  Service: Gastroenterology;  Laterality: N/A;   GIVENS CAPSULE STUDY N/A 06/09/2021   Procedure: GIVENS CAPSULE STUDY;  Surgeon: Toney ReilVanga, Rohini Reddy, MD;  [facility];  Service: Gastroenterology;  Laterality: N/A;   HEMORRHOID SURGERY     LAPAROSCOPIC GASTRIC RESTRICTIVE  DUODENAL PROCEDURE (DUODENAL SWITCH) Bilateral    2020   Patient Active Problem List   Diagnosis Date Noted   Transaminitis 11/22/2021   Tinea pedis 08/31/2021   Upper GI bleed 06/06/2021   Alcohol abuse 06/06/2021   Eye discharge 02/16/2021   IDA (iron deficiency anemia) 02/09/2021   Acute blood loss anemia 02/06/2021   Elevated liver function tests    Bright red blood per rectum 02/05/2021    Open wound of left heel 01/20/2021   Anxiety and depression 11/25/2020   CPAP (continuous positive airway pressure) dependence 11/25/2020   Sinusitis 11/25/2020   Allergic conjunctivitis of both eyes and rhinitis 11/25/2020   GI bleed 11/17/2020   Chronic iron deficiency anemia    Symptomatic anemia 11/11/2020   Postop check 08/07/2020   Biliary sludge determined by ultrasound 07/02/2020   Right upper quadrant abdominal pain 06/10/2020   Low back pain 05/18/2020   Muscle spasm 05/18/2020   Bilateral tinnitus 04/30/2020   Irregular heartbeat 02/25/2020   Diabetes mellitus without complication 02/25/2020   Strain of right knee 02/10/2020   Aspirin long-term use 10/22/2019   Wound ballistics 08/29/2019   Displacement of lumbar intervertebral disc without myelopathy 08/29/2019   Helicobacter pylori gastrointestinal tract infection 08/29/2019   Hypercholesterolemia 08/29/2019   Phlebitis after infusion 06/26/2019   Superficial thrombophlebitis of left upper extremity 06/26/2019   BMI 38.0-38.9,adult 06/19/2019   Small bowel obstruction 05/25/2019   Morbid obesity 05/16/2019   Cervical pain (neck) 04/09/2019   Pedal edema 03/06/2019   Pre-operative clearance 03/06/2019   History of sleeve gastrectomy 01/23/2019   Chronic pain syndrome 10/03/2018   Obstructive sleep apnea 10/03/2018   Right shoulder pain 06/08/2018   Chronic venous insufficiency 05/24/2018   Lymphedema 05/24/2018   Lumbar spondylosis 05/17/2018   Leg swelling 05/09/2018   Neuropathy 01/24/2018   Insomnia 07/12/2017   Fracture of phalanx of finger 05/31/2017   Impingement syndrome of shoulder region 05/31/2017   Hemorrhoids 12/15/2016   Hypothyroidism 10/03/2016   Chronic shoulder bursitis 08/30/2016   Positive ANA (antinuclear antibody) 08/30/2016   Bradycardia 06/16/2016   History of adenomatous polyp of colon 05/02/2016   Elevated liver enzymes 04/06/2016   OSA (obstructive sleep apnea) 04/06/2016    Bilateral shoulder pain 01/13/2016   Fatty liver 12/08/2015   Arthritis 12/08/2015   History of bariatric surgery 12/07/2015   Genital herpes 11/11/2015   Hyperlipidemia 11/11/2015   Essential hypertension 11/11/2015   Routine physical examination 11/11/2015   Allergic rhinitis 11/11/2015   GERD (gastroesophageal reflux disease) 11/11/2015   Herpesviral infection, unspecified 01/08/2014   Disorder of thyroid, unspecified 01/08/2014   Lumbar radiculitis 11/19/2013   Neuritis or radiculitis due to rupture of lumbar intervertebral disc 11/19/2013   Monilial vaginitis 08/20/2013   Gastritis and duodenitis 07/25/2013   Impaired fasting glucose 07/17/2012   Obesity, unspecified 07/17/2012   Obesity 07/17/2012   Depression 03/15/2012   Abnormal electrocardiogram (ECG) (EKG) 03/06/2012   URI (upper respiratory infection) 01/16/2012    REFERRING DIAG: M54.41,G89.29 (ICD-10-CM) - Chronic right-sided low back pain with right-sided sciatica M43.10 (ICD-10-CM) - Anterolisthesis   THERAPY DIAG:  Other low back pain  Radiculopathy, lumbar region  Sciatica, right side  Muscle weakness (generalized)  Difficulty in walking, not elsewhere classified  Unsteadiness on feet  Rationale for Evaluation and Treatment Rehabilitation  PERTINENT HISTORY: Low back pain with R LE sciatica. PT states having R LE neuropathy. Has a R heel wound and currently wearing an orthotic shoe which makes her  feel off balance. Usually off balance as well without the orthotic shoe due to R LE neuropathy. R LE symptoms along the L5 dermatome. Has felt R thigh numbness since having surgery in which an intsrument went up her R thigh last year. Also has R LE symptoms that shoots all the way down to her R foot (along the L 5 dermatome). Has central low back pain which began about 20 years ago, gradual onset. Denies loss of bowel or bladder control or saddle anesthesia. Pain has worsend since onset.   PRECAUTIONS:  Fall  risk        SUBJECTIVE:   SUBJECTIVE STATEMENT: Back is not as uncomfortable. Does not think she has back pain. Took tylenol this morning. Usually takes tylenol everyday    PAIN:  Are you having pain? See subjective   TODAY'S TREATMENT:                                                                                                                                         DATE: 04/28/2022  Larey Seat last year a few times while walking. Slipped on snow on the grass one time, and the other time, pt was walking too fast, lost her balance, and the next thing she knows, she was on the floor.    Pt was told to have arthritis in her neck and low back.      No latex allergies Blood pressure is controlled per pt.    L to R pressure to L thoracic convexity decreased low back pressure in standing.    Gait: unsteady. Pt states having a SPC in the car.        Therapeutic exercise   Standing transversus abdominis and glute max contraction 4x5 seconds   Low back discomfort.   Standing posterior pelvic tilts 10x5 seconds   Seated L to R pressure to L thoracic convexity with transversus abdominis contraction 5x5 seconds   Seated gentle manually resisted R lateral shift isometrics 10x5 seconds for 2 sets  More neutral spine observed during exercise.    Seated glute sets 10x5 seconds  Increased back pain   Reclined   Hooklying    Transversus abdominis contraction 10x5 seconds for 3 sets    Glute max squeeze 10x5 seconds, then 4x5 seconds. L gastroc cramp    Then with legs straight 10x5 seconds for 2 sets     Good glute muscle use felt     Improved exercise technique, movement at target joints, use of target muscles after mod verbal, visual, tactile cues.         Response to treatment Pt tolerated session well without aggravation of symptoms. No back pain reported after session.        Clinical impression Continued working on improving trunk and glute strength/muscle  activation secondary to weakness and to decrease stress to low back. Best exercise tolerance tends to be when  pt is in reclined position with legs straight. No back pain reported after session. Good glute max muscle use reported with exercises. Pt will benefit from continued skilled physical therapy services to decrease pain, improve strength, balance, and function.       PATIENT EDUCATION: Education details: there-ex, HEP Person educated: Patient Education method: Explanation, Demonstration, Tactile cues, Verbal cues, and Handouts Education comprehension: verbalized understanding and returned demonstration  HOME EXERCISE PROGRAM: Access Code: [email or web address] URL: [email or web address] Date: 04/25/2022 Prepared by: Loralyn Freshwater  Exercises - Seated Transversus Abdominis Bracing  - 3 x daily - 7 x weekly - 3 sets - 10 reps - 5 seconds  hold - Seated Gluteal Sets  - 3 x daily - 7 x weekly - 3 sets - 10 reps - 5 seconds  hold     PT Short Term Goals - 04/20/22 1056       PT SHORT TERM GOAL #1   Title Pt will be independent with her initial HEP to improve strength, decrease pain, and improve ability to ambulate, perform standing and lifting tasks more comfortably for her back and with less difficulty.    Time 3    Period Weeks    Status New    Target Date 05/12/22              PT Long Term Goals - 04/20/22 1057       PT LONG TERM GOAL #1   Title Pt will have a decrease in low back pain to 5/10 or less at worst to promote ability to ambulate, perform standing and lifting tasks more comfortably for her back and with less difficulty.    Baseline 10/10 low back pain at worst for the past 3 months (04/20/2022)    Time 8    Period Weeks    Status New    Target Date 06/16/22      PT LONG TERM GOAL #2   Title Pt will have a decrease in R LE pain to 3/10 or less at worst to promote ability to ambulate, perform standing and lifting tasks more comfortably and with less  difficulty.    Baseline 7/10 R LE pain at worst for the past 3 months (04/20/2022)    Time 8    Period Weeks    Status New    Target Date 06/16/22      PT LONG TERM GOAL #3   Title Pt will improve bilateral hip extension and abduction strength by at least 1/2 MMT grade to promote ability to ambulate, perform standing tasks with less difficulty and less low back pain.    Baseline Seated manually resisted hip extension 3+/5 R, 4-/5 L, hip abduction 4/5 R, 4/5 L (04/20/2022)    Time 8    Period Weeks    Status New    Target Date 06/16/22      PT LONG TERM GOAL #4   Title Pt will improve her lumbar spine FOTO score by at least 10 points as a demonstration of improved function.    Baseline Lumbar spine FOTO 31 (04/20/2022)    Time 8    Period Weeks    Status New    Target Date 06/16/22              Plan - 04/28/22 0801     Clinical Impression Statement Continued working on improving trunk and glute strength/muscle activation secondary to weakness and to decrease stress to low back. Best exercise tolerance tends to be when pt  is in reclined position with legs straight. No back pain reported after session. Good glute max muscle use reported with exercises. Pt will benefit from continued skilled physical therapy services to decrease pain, improve strength, balance, and function.    Personal Factors and Comorbidities Age;Comorbidity 3+;Fitness;Past/Current Experience;Time since onset of injury/illness/exacerbation    Comorbidities Anxiety, HTN, neuropathy, osteoarthritis, sleep apnea, R heel wound, hx of bariatric surgery    Examination-Activity Limitations Bed Mobility;Bathing;Reach Overhead;Stairs;Dressing;Stand;Bend;Hygiene/Grooming;Sit;Caring for Others;Lift;Sleep;Transfers;Carry;Locomotion Level;Squat    Examination-Participation Restrictions Yard Work;Laundry;Cleaning    Stability/Clinical Decision Making Evolving/Moderate complexity   pt states pain has worsened since onset   Rehab  Potential Fair    PT Frequency 2x / week    PT Duration 8 weeks    PT Treatment/Interventions Therapeutic exercise;Therapeutic activities;Functional mobility training;Balance training;Neuromuscular re-education;Patient/family education;Manual techniques;Dry needling;Aquatic Therapy;Electrical Stimulation;Iontophoresis 4mg /ml Dexamethasone;Gait training;Stair training    PT Next Visit Plan posture, thoracic extension, scapular, trunk, glute strengthening, lumbopelvic control, balance, manual techniques, modalities PRN    Consulted and Agree with Plan of Care Patient              Loralyn Freshwater PT, DPT  04/28/2022, 8:54 AM

## 2022-04-28 NOTE — Telephone Encounter (Signed)
Per Dr Cathie Hoops I called patient after lunch to see if she just wants to have her PCP draw labs since she has an appointment with her tomorrow morning. I got her voice mail and left her a message, regarding this but have not heard back form her

## 2022-04-29 ENCOUNTER — Encounter: Payer: Medicare Other | Admitting: Physician Assistant

## 2022-04-29 ENCOUNTER — Encounter: Payer: Self-pay | Admitting: Oncology

## 2022-04-29 ENCOUNTER — Encounter: Payer: Self-pay | Admitting: Family

## 2022-04-29 ENCOUNTER — Ambulatory Visit (INDEPENDENT_AMBULATORY_CARE_PROVIDER_SITE_OTHER): Payer: Medicare Other | Admitting: Family

## 2022-04-29 VITALS — BP 118/70 | HR 65 | Temp 98.4°F | Ht 61.0 in | Wt 182.0 lb

## 2022-04-29 DIAGNOSIS — Z1231 Encounter for screening mammogram for malignant neoplasm of breast: Secondary | ICD-10-CM | POA: Diagnosis not present

## 2022-04-29 DIAGNOSIS — L89613 Pressure ulcer of right heel, stage 3: Secondary | ICD-10-CM | POA: Diagnosis not present

## 2022-04-29 DIAGNOSIS — G9009 Other idiopathic peripheral autonomic neuropathy: Secondary | ICD-10-CM | POA: Diagnosis not present

## 2022-04-29 DIAGNOSIS — K649 Unspecified hemorrhoids: Secondary | ICD-10-CM

## 2022-04-29 DIAGNOSIS — G629 Polyneuropathy, unspecified: Secondary | ICD-10-CM

## 2022-04-29 DIAGNOSIS — Z9884 Bariatric surgery status: Secondary | ICD-10-CM

## 2022-04-29 DIAGNOSIS — L97528 Non-pressure chronic ulcer of other part of left foot with other specified severity: Secondary | ICD-10-CM | POA: Diagnosis not present

## 2022-04-29 DIAGNOSIS — F419 Anxiety disorder, unspecified: Secondary | ICD-10-CM | POA: Diagnosis not present

## 2022-04-29 DIAGNOSIS — E038 Other specified hypothyroidism: Secondary | ICD-10-CM

## 2022-04-29 DIAGNOSIS — D500 Iron deficiency anemia secondary to blood loss (chronic): Secondary | ICD-10-CM | POA: Diagnosis not present

## 2022-04-29 DIAGNOSIS — I10 Essential (primary) hypertension: Secondary | ICD-10-CM

## 2022-04-29 DIAGNOSIS — L89623 Pressure ulcer of left heel, stage 3: Secondary | ICD-10-CM | POA: Diagnosis not present

## 2022-04-29 DIAGNOSIS — F32A Depression, unspecified: Secondary | ICD-10-CM | POA: Diagnosis not present

## 2022-04-29 DIAGNOSIS — Z780 Asymptomatic menopausal state: Secondary | ICD-10-CM | POA: Diagnosis not present

## 2022-04-29 DIAGNOSIS — Z13820 Encounter for screening for osteoporosis: Secondary | ICD-10-CM

## 2022-04-29 DIAGNOSIS — E7800 Pure hypercholesterolemia, unspecified: Secondary | ICD-10-CM | POA: Diagnosis not present

## 2022-04-29 DIAGNOSIS — Z0000 Encounter for general adult medical examination without abnormal findings: Secondary | ICD-10-CM

## 2022-04-29 LAB — COMPREHENSIVE METABOLIC PANEL
ALT: 69 U/L — ABNORMAL HIGH (ref 0–35)
AST: 343 U/L — ABNORMAL HIGH (ref 0–37)
Albumin: 4 g/dL (ref 3.5–5.2)
Alkaline Phosphatase: 243 U/L — ABNORMAL HIGH (ref 39–117)
BUN: 9 mg/dL (ref 6–23)
CO2: 25 mEq/L (ref 19–32)
Calcium: 8.9 mg/dL (ref 8.4–10.5)
Chloride: 97 mEq/L (ref 96–112)
Creatinine, Ser: 0.86 mg/dL (ref 0.40–1.20)
GFR: 67.36 mL/min (ref >60.00)
Glucose, Bld: 93 mg/dL (ref 70–99)
Potassium: 4.3 mEq/L (ref 3.5–5.1)
Sodium: 133 mEq/L — ABNORMAL LOW (ref 135–145)
Total Bilirubin: 0.9 mg/dL (ref 0.2–1.2)
Total Protein: 6.7 g/dL (ref 6.0–8.3)

## 2022-04-29 LAB — B12 AND FOLATE PANEL
Folate: 3.6 ng/mL — ABNORMAL LOW (ref >5.9)
Vitamin B-12: 1023 pg/mL — ABNORMAL HIGH (ref 211–911)

## 2022-04-29 LAB — CBC WITH DIFFERENTIAL/PLATELET
Basophils Absolute: 0 10*3/uL (ref 0.0–0.1)
Basophils Relative: 0.8 % (ref 0.0–3.0)
Eosinophils Absolute: 0.1 10*3/uL (ref 0.0–0.7)
Eosinophils Relative: 1.1 % (ref 0.0–5.0)
HCT: 34.2 % — ABNORMAL LOW (ref 36.0–46.0)
Hemoglobin: 11.2 g/dL — ABNORMAL LOW (ref 12.0–15.0)
Lymphocytes Relative: 33.5 % (ref 12.0–46.0)
Lymphs Abs: 1.9 10*3/uL (ref 0.7–4.0)
MCHC: 32.6 g/dL (ref 30.0–36.0)
MCV: 94.2 fl (ref 78.0–100.0)
Monocytes Absolute: 0.5 10*3/uL (ref 0.1–1.0)
Monocytes Relative: 9.3 % (ref 3.0–12.0)
Neutro Abs: 3.1 10*3/uL (ref 1.4–7.7)
Neutrophils Relative %: 55.3 % (ref 43.0–77.0)
Platelets: 176 10*3/uL (ref 150.0–400.0)
RBC: 3.63 Mil/uL — ABNORMAL LOW (ref 3.87–5.11)
RDW: 14 % (ref 11.5–15.5)
WBC: 5.6 10*3/uL (ref 4.0–10.5)

## 2022-04-29 LAB — TSH: TSH: 4.15 u[IU]/mL (ref 0.35–5.50)

## 2022-04-29 LAB — MICROALBUMIN / CREATININE URINE RATIO
Creatinine,U: 41.7 mg/dL
Microalb Creat Ratio: 1.7 mg/g (ref 0.0–30.0)
Microalb, Ur: <0.7 mg/dL (ref 0.0–1.9)

## 2022-04-29 LAB — VITAMIN D 25 HYDROXY (VIT D DEFICIENCY, FRACTURES): VITD: 41.54 ng/mL (ref 30.00–100.00)

## 2022-04-29 MED ORDER — ACYCLOVIR 400 MG PO TABS: 400.0000 mg | ORAL_TABLET | Freq: Every day | ORAL | 5 refills | Status: DC

## 2022-04-29 MED ORDER — TRAZODONE HCL 50 MG PO TABS: ORAL_TABLET | ORAL | 3 refills | Status: DC

## 2022-04-29 MED ORDER — PREGABALIN 50 MG PO CAPS: 50.0000 mg | ORAL_CAPSULE | Freq: Three times a day (TID) | ORAL | 2 refills | Status: DC

## 2022-04-29 MED ORDER — HYDROCORTISONE ACETATE 25 MG RE SUPP: RECTAL | 1 refills | Status: DC

## 2022-04-29 MED ORDER — LOSARTAN POTASSIUM 100 MG PO TABS: ORAL_TABLET | ORAL | 3 refills | Status: DC

## 2022-04-29 NOTE — Progress Notes (Addendum)
Jennifer [name] ([id number]) [id number] Page 1 of 6 Visit Report for 4/[name]/2024 Chief Complaint Document Details Patient Name: Date of Service: Jennifer [name] 4/[name]/2024 11:15 A M [name] Record Number: [id number] Patient Account Number: [id number] Date of Birth/Sex: Treating [name]: Jennifer [name] (72 y.o. Jennifer [name] Primary Care Provider: [name] Other Clinician: Referring Provider: Treating Provider/Extender: [name] in Treatment: 5 Information Obtained from: Patient Chief Complaint 03/24/2022; patient is here for review of wounds on the 2 locations on the left lateral foot Electronic Signature(s) Signed: 4/[name]/2024 11:30:31 AM By: Allen Derry PA-C Entered By: Allen Derry on 04/[name]/2024 11:30:30 -------------------------------------------------------------------------------- Debridement Details Patient Name: Date of Service: Jennifer [name] 4/[name]/2024 11:15 A M [name] Record Number: [id number] Patient Account Number: [id number] Date of Birth/Sex: Treating [name]: [name]-03-[birth year]ifer [name] (72 y.o. Jennifer [name], Jennifer [name] Primary Care Provider: [name] Other Clinician: Referring Provider: Treating Provider/Extender: [name] in Treatment: 5 Debridement Performed for Assessment: Wound #1 Right,Posterior Calcaneus Performed By: Physician Allen Derry, PA-C Debridement Type: Debridement Level of Consciousness (Pre-procedure): Awake and Alert Pre-procedure Verification/Time Out Yes - 11:48 Taken: T Area Debrided (L x W): otal 0.9 (cm) x 0.6 (cm) = 0.54 (cm) Tissue and other material debrided: Viable, Non-Viable, Callus, Slough, Subcutaneous, Biofilm, Slough Level: Skin/Subcutaneous Tissue Debridement Description: Excisional Instrument: Curette Bleeding: Minimum Hemostasis Achieved: Pressure Response to Treatment: Procedure was tolerated well Level of Consciousness (Post- Awake and Alert procedure): Post  Debridement Measurements of Total Wound Length: (cm) 0.9 Stage: Category/Stage III Width: (cm) 0.6 Depth: (cm) 0.3 Volume: (cm) 0.127 Character of Wound/Ulcer Post Debridement: Stable Post Procedure Diagnosis Same as Pre-procedure Electronic Signature(s) Signed: 4/[name]/2024 [name]:36:06 PM By: Jennifer [name], BSN, [name], Jennifer [name], Jennifer [name], Jennifer [name] Signed: 4/[name]/2024 1:04:02 PM By: Allen Derry PA-C Entered By: Jennifer [name] BSN, [name], Jennifer [name], Jennifer [name] on 04/[name]/2024 11:49:04 -------------------------------------------------------------------------------- HPI Details Patient Name: Date of Service: Jennifer [name] 4/[name]/2024 11:15 A Jennifer [name], Jennifer [name] ([id number]) [phone].pdf Page 2 of 6 [name] Record Number: [id number] Patient Account Number: [id number] Date of Birth/Sex: Treating [name]: [name]-Feb-Jennifer [name] (72 y.o. Jennifer [name] Primary Care Provider: [name] Other Clinician: Referring Provider: Treating Provider/Extender: [name] in Treatment: 5 History of Present Illness HPI Description: ADMISSION 03/24/2022 This is a 72 year old woman who has had 2 small open areas on the right lateral foot. She is not a diabetic but does have peripheral neuropathy. She was seen by [facility] yesterday. ABIs w She has not had previous wounds in this area. ABI 1.20 on the right and TBI at 1.02. On the left 1.23 and 1.00 on the left Past [name] history; is not noted not a diabetic. She has gastroesophageal reflux disease, high cholesterol, hyperlipidemia, hypertension, hypothyroidism, sleep apnea 04-07-2022 upon evaluation today patient appears to be doing well currently in regard to her wound. The lateral portion of her foot appears to be completely healed which is great news and I am very pleased in that regard. In regard to the medial portion of her foot and the heel location this is still open and does appear to need some sharp debridement today. I discussed with her  today that we are going to proceed with debridement at this point. She is in agreement with that plan. 04-14-2022 upon evaluation today patient appears to be doing well currently in regard to her wound. She has been tolerating the dressing changes without complication and fortunately I do not see any signs of active infection locally nor systemically at this time.  She did have some area of dark discoloration where I used a silver nitrate last week because of this she felt like that the [facility] was doing worse for her and she quit using it just using Neosporin. With that being said right now I think she really needs to get back to using the [facility] and she needs to be using offloading shoe. 04-22-2022 upon evaluation today patient appears to be doing well currently in regard to the wounds on her heel which are showing signs of improvement and the actual wound is measuring smaller she does have some irritation around the wound, cleaned away some of this callus today. Fortunately I do not see any evidence of active infection locally nor systemically which is great news. 4-[name]-2024 upon evaluation today patient appears to be doing well currently in regard to her wound. She is measuring about the same but nonetheless is still showing signs of improvement there is a lot of the discoloration around the edge which is improving little by little as we debride this away as well. Fortunately I am very pleased with where things stand. Electronic Signature(s) Signed: 4/[name]/2024 11:55:18 AM By: Allen Derry PA-C Entered By: Allen Derry on 04/[name]/2024 11:55:18 -------------------------------------------------------------------------------- Physical Exam Details Patient Name: Date of Service: Jennifer [name] 4/[name]/2024 11:15 A M [name] Record Number: [id number] Patient Account Number: [id number] Date of Birth/Sex: Treating [name]: Jennifer [name][birth year] (72 y.o. Jennifer [name] Primary Care Provider: [name]  Other Clinician: Referring Provider: Treating Provider/Extender: [name] in Treatment: 5 Constitutional Well-nourished and well-hydrated in no acute distress. Respiratory normal breathing without difficulty. Psychiatric this patient is able to make decisions and demonstrates good insight into disease process. Alert and Oriented x 3. pleasant and cooperative. Notes Patient's wound did require sharp debridement today to clearway necrotic debris she tolerated this without complication postdebridement wound bed actually appears to be doing much better which is great news. With that being said I think that we are moving in a good direction she is to make sure that the dressing is staying in place apparently the [facility] was slipping to the side. Electronic Signature(s) Signed: 4/[name]/2024 11:55:34 AM By: Allen Derry PA-C Entered By: Allen Derry on 04/[name]/2024 11:55:34 -------------------------------------------------------------------------------- Physician Orders Details Patient Name: Date of Service: Jennifer Jennifer [name] 4/[name]/2024 11:15 A Jennifer [name], Jennifer [name] ([id number]) [phone].pdf Page 3 of 6 [name] Record Number: [id number] Patient Account Number: [id number] Date of Birth/Sex: Treating [name]: Jennifer [name][birth year] (72 y.o. Jennifer [name] Primary Care Provider: [name] Other Clinician: Referring Provider: Treating Provider/Extender: [name] in Treatment: 5 Verbal / Phone Orders: No Diagnosis Coding ICD-10 Coding Code Description G90.09 Other idiopathic peripheral autonomic neuropathy L97.528 Non-pressure chronic ulcer of other part of left foot with other specified severity Follow-up Appointments Return Appointment in 1 week. Bathing/ [employer] wounds with antibacterial soap and water. Off-Loading Heel suspension boot Wound Treatment Wound #1 - Calcaneus Wound Laterality: Right,  Posterior Cleanser: Soap and Water 3 x Per Week/30 Days Discharge Instructions: Gently cleanse wound with antibacterial soap, rinse and pat dry prior to dressing wounds Prim Dressing: Hydrofera Blue Ready Transfer Foam, 2.5x2.5 (in/in) (Generic) 3 x Per Week/30 Days ary Discharge Instructions: Apply Hydrofera Blue Ready to wound bed as directed Secondary Dressing: Gauze 3 x Per Week/30 Days Discharge Instructions: As directed: dry, moistened with saline or moistened with Dakins Solution Secured With: [employer] Sterile or Non-Sterile 6-ply 4.5x4 (yd/yd) 3 x Per Week/30 Days Discharge Instructions:  Apply Kerlix as directed Psychologist, [facility] probation services) Signed: 4/[name]/2024 [name]:36:06 PM By: Jennifer [name], BSN, [name], Jennifer [name], Jennifer [name], Jennifer [name] Signed: 4/[name]/2024 1:04:02 PM By: Allen Derry PA-C Entered By: Jennifer [name] BSN, [name], Jennifer [name], Jennifer [name] on 04/[name]/2024 11:53:23 -------------------------------------------------------------------------------- Problem List Details Patient Name: Date of Service: Jennifer [name] 4/[name]/2024 11:15 A M [name] Record Number: [id number] Patient Account Number: [id number] Date of Birth/Sex: Treating [name]: [birth year] (72 y.o. Jennifer [name] Primary Care Provider: [name] Other Clinician: Referring Provider: Treating Provider/Extender: [name] in Treatment: 5 Active Problems ICD-10 Encounter Code Description Active Date MDM Diagnosis G90.09 Other idiopathic peripheral autonomic neuropathy 03/24/2022 No Yes L97.528 Non-pressure chronic ulcer of other part of left foot with other specified 03/24/2022 No Yes severity Inactive Problems Resolved Problems STACEY, SAGO ([id number]) [phone].pdf Page 4 of 6 Electronic Signature(s) Signed: 4/[name]/2024 11:30:19 AM By: Allen Derry PA-C Entered By: Allen Derry on 04/[name]/2024 11:30:19 -------------------------------------------------------------------------------- Progress Note Details Patient  Name: Date of Service: Jennifer Jennifer [name] 4/[name]/2024 11:15 A M [name] Record Number: [id number] Patient Account Number: [id number] Date of Birth/Sex: Treating [name]: Jennifer [name], Jennifer [name] (72 y.o. Jennifer [name] Primary Care Provider: [name] Other Clinician: Referring Provider: Treating Provider/Extender: [name] in Treatment: 5 Subjective Chief Complaint Information obtained from Patient 03/24/2022; patient is here for review of wounds on the 2 locations on the left lateral foot History of Present Illness (HPI) ADMISSION 03/24/2022 This is a 72 year old woman who has had 2 small open areas on the right lateral foot. She is not a diabetic but does have peripheral neuropathy. She was seen by [facility] yesterday. ABIs w She has not had previous wounds in this area. ABI 1.20 on the right and TBI at 1.02. On the left 1.23 and 1.00 on the left Past [name] history; is not noted not a diabetic. She has gastroesophageal reflux disease, high cholesterol, hyperlipidemia, hypertension, hypothyroidism, sleep apnea 04-07-2022 upon evaluation today patient appears to be doing well currently in regard to her wound. The lateral portion of her foot appears to be completely healed which is great news and I am very pleased in that regard. In regard to the medial portion of her foot and the heel location this is still open and does appear to need some sharp debridement today. I discussed with her today that we are going to proceed with debridement at this point. She is in agreement with that plan. 04-14-2022 upon evaluation today patient appears to be doing well currently in regard to her wound. She has been tolerating the dressing changes without complication and fortunately I do not see any signs of active infection locally nor systemically at this time. She did have some area of dark discoloration where I used a silver nitrate last week because of this she felt like that  the [name] was doing worse for her and she quit using it just using Neosporin. With that being said right now I think she really needs to get back to using the [name] and she needs to be using offloading shoe. 04-22-2022 upon evaluation today patient appears to be doing well currently in regard to the wounds on her heel which are showing signs of improvement and the actual wound is measuring smaller she does have some irritation around the wound, cleaned away some of this callus today. Fortunately I do not see any evidence of active infection locally nor systemically which is great news. 4-[name]-2024 upon evaluation today patient appears to be doing well  currently in regard to her wound. She is measuring about the same but nonetheless is still showing signs of improvement there is a lot of the discoloration around the edge which is improving little by little as we debride this away as well. Fortunately I am very pleased with where things stand. Objective Constitutional Well-nourished and well-hydrated in no acute distress. Vitals Time Taken: 11:27 AM, Height: 61 in, Weight: 185 lbs, BMI: 35, Pulse: 67 bpm, Respiratory Rate: 18 breaths/min, Blood Pressure: 120/69 mmHg. Respiratory normal breathing without difficulty. Psychiatric this patient is able to make decisions and demonstrates good insight into disease process. Alert and Oriented x 3. pleasant and cooperative. General Notes: Patient's wound did require sharp debridement today to clearway necrotic debris she tolerated this without complication postdebridement wound bed actually appears to be doing much better which is great news. With that being said I think that we are moving in a good direction she is to make sure that the dressing is staying in place apparently the [name] was slipping to the side. Integumentary (Hair, Skin) Wound #1 status is Open. Original cause of wound was Pressure Injury. The date acquired was:  [name]/28/2023. The wound has been in treatment 5 weeks. The wound is located on the Right,Posterior Calcaneus. The wound measures 0.9cm length x 0.6cm width x 0.2cm depth; 0.424cm^2 area and 0.085cm^3 volume. There is Fat Layer (Subcutaneous Tissue) exposed. There is no tunneling or undermining noted. There is a small amount of serous drainage noted. The wound SANTIANA, ABRIL ([id number]) [id number] Page 5 of 6 margin is distinct with the outline attached to the wound base. There is medium (34-66%) granulation within the wound bed. There is a medium (34-66%) amount of necrotic tissue within the wound bed including Adherent Slough. General Notes: callus surrounding wound Assessment Active Problems ICD-10 Other idiopathic peripheral autonomic neuropathy Non-pressure chronic ulcer of other part of left foot with other specified severity Procedures Wound #1 Pre-procedure diagnosis of Wound #1 is a Pressure Ulcer located on the Right,Posterior Calcaneus . There was a Excisional Skin/Subcutaneous Tissue Debridement with a total area of 0.54 sq cm performed by Allen Derry, PA-C. With the following instrument(s): Curette to remove Viable and Non-Viable tissue/material. Material removed includes Callus, Subcutaneous Tissue, Slough, and Biofilm. No specimens were taken. A time out was conducted at 11:48, prior to the start of the procedure. A Minimum amount of bleeding was controlled with Pressure. The procedure was tolerated well. Post Debridement Measurements: 0.9cm length x 0.6cm width x 0.3cm depth; 0.127cm^3 volume. Post debridement Stage noted as Category/Stage III. Character of Wound/Ulcer Post Debridement is stable. Post procedure Diagnosis Wound #1: Same as Pre-Procedure Plan Follow-up Appointments: Return Appointment in 1 week. Bathing/ Shower/ Hygiene: Wash wounds with antibacterial soap and water. Off-Loading: Heel suspension boot WOUND #1: - Calcaneus Wound  Laterality: Right, Posterior Cleanser: Soap and Water 3 x Per Week/30 Days Discharge Instructions: Gently cleanse wound with antibacterial soap, rinse and pat dry prior to dressing wounds Prim Dressing: Hydrofera Blue Ready Transfer Foam, 2.5x2.5 (in/in) (Generic) 3 x Per Week/30 Days ary Discharge Instructions: Apply Hydrofera Blue Ready to wound bed as directed Secondary Dressing: Gauze 3 x Per Week/30 Days Discharge Instructions: As directed: dry, moistened with saline or moistened with Dakins Solution Secured With: [employer] Sterile or Non-Sterile 6-ply 4.5x4 (yd/yd) 3 x Per Week/30 Days Discharge Instructions: Apply Kerlix as directed 1. I would recommend currently that the patient should continue to monitor for any signs of infection or worsening. Based on  what I am seeing I do believe her making some really good progress here however. 2. I am also can recommend the patient continue to use the [facility] which I think is doing a good job. 3. She will continue as well with the ABD pad and roll gauze to secure in place. If this is not staying what needs to be and we will subsequently consider switching to possibly a bordered foam dressing. We will see patient back for reevaluation in 1 week here in the clinic. If anything worsens or changes patient will contact our office for additional recommendations. Electronic Signature(s) Signed: 4/[name]/2024 11:56:24 AM By: Allen Derry PA-C Entered By: Allen Derry on 04/[name]/2024 11:56:24 -------------------------------------------------------------------------------- SuperBill Details Patient Name: Date of Service: Jennifer [name] Ce 4/[name]/2024 [name] Record Number: [id number] Patient Account Number: [id number] Date of Birth/Sex: Treating [name]: Jennifer [name][birth year] (72 y.o. Jennifer [name] Primary Care Provider: [name] Other Clinician: Referring Provider: Treating Provider/Extender: [name] in Treatment:  5 [place], Jennifer [name] ([id number]) [id number] Page 6 of 6 Diagnosis Coding ICD-10 Codes Code Description G90.09 Other idiopathic peripheral autonomic neuropathy L97.528 Non-pressure chronic ulcer of other part of left foot with other specified severity Facility Procedures : CPT4 Code: [id number] Description: 11042 - DEB SUBQ TISSUE 20 SQ CM/< ICD-10 Diagnosis Description L97.528 Non-pressure chronic ulcer of other part of left foot with other specified seve Modifier: rity Quantity: 1 Physician Procedures : CPT4 Code Description Modifier [id number] 11042 - WC PHYS SUBQ TISS 20 SQ CM ICD-10 Diagnosis Description L97.528 Non-pressure chronic ulcer of other part of left foot with other specified severity Quantity: 1 Electronic Signature(s) Signed: 4/[name]/2024 11:56:31 AM By: Allen Derry PA-C Entered By: Allen Derry on 04/[name]/2024 11:56:30

## 2022-04-29 NOTE — Assessment & Plan Note (Signed)
Chronic, stable.  Continue losartan 100 mg daily, triamterene hydrochlorothiazide 37.5-25 mg daily  

## 2022-04-29 NOTE — Assessment & Plan Note (Signed)
Clinically asymptomatic. Pending TSH. Continue synthroid .

## 2022-04-29 NOTE — Progress Notes (Signed)
Assessment & Plan:  Neuropathy Assessment & Plan: Suboptimal control.  Fortunately no history of PAD .03/23/2022, ABIs with toe brachial index normal bilaterally  Wean off of gabapentin, cross-taper with Lyrica 50 mg 3 times daily.  Follow-up in 6 weeks time.  Orders: -     Microalbumin / creatinine urine ratio -     CBC with Differential/Platelet -     Comprehensive metabolic panel -     TSH -     VITAMIN D 25 Hydroxy (Vit-D Deficiency, Fractures) -     B12 and Folate Panel -     Pregabalin; Take 1 capsule (50 mg total) by mouth 3 (three) times daily.  Dispense: 90 capsule; Refill: 2  Essential hypertension Assessment & Plan: Chronic, stable.  Continue losartan 100 mg daily, triamterene hydrochlorothiazide 37.5-25 mg daily   Orders: -     Microalbumin / creatinine urine ratio -     TSH  Asymptomatic postmenopausal state -     VITAMIN D 25 Hydroxy (Vit-D Deficiency, Fractures)  History of bariatric surgery -     VITAMIN D 25 Hydroxy (Vit-D Deficiency, Fractures) -     B12 and Folate Panel -     Iron, TIBC and Ferritin Panel  Encounter for general adult medical examination without abnormal findings -     Acyclovir; Take 1 tablet (400 mg total) by mouth daily.  Dispense: 30 tablet; Refill: 5  Hemorrhoids, unspecified hemorrhoid type -     Hydrocortisone Acetate; Unwrap and insert 1 suppository rectally twice a day  Dispense: 12 suppository; Refill: 1  Anxiety and depression -     traZODone HCl; for sleep DX Code G47.00  Dispense: 90 tablet; Refill: 3  Encounter for screening mammogram for malignant neoplasm of breast -     3D Screening Mammogram, Left and Right; Future  Screening for osteoporosis  Postmenopausal estrogen deficiency -     DG Bone Density; Future  Other specified hypothyroidism Assessment & Plan: Clinically asymptomatic. Pending TSH. Continue synthroid .    Other orders -     Losartan Potassium; TAKE 1 TABLET BY MOUTH EVERYDAY AT BEDTIME   Dispense: 90 tablet; Refill: 3     Return precautions given.   Risks, benefits, and alternatives of the medications and treatment plan prescribed today were discussed, and patient expressed understanding.   Education regarding symptom management and diagnosis given to patient on AVS either electronically or printed.  Return in about 6 weeks (around 06/10/2022).  [name], FNP  Subjective:    Patient ID: Jennifer [name], female    DOB: 1950/07/20, 72 y.o.   MRN: [id number]  CC: Jennifer [name] is a 72 y.o. female who presents today for follow up.   HPI: Feels well today.  No new concerns.  She continues to have painful neuropathy worse at nighttime.  Interferes with her sleep. She is compliant with Gabapentin 900mg  qam and 900mg  qam.    She is having breakthrough numbness from toes to ankle.   She is currently doing PT for low back pain with right leg numbness. She is following Dr Myer Haff.   Hemorrhoids are not longer bleeding.   No regular exercise.   03/23/2022, ABIs with toe brachial index normal bilaterally  She has chronic right foot wound.  She has follow-up with wound care today.  Allergies: Bee pollen, Celecoxib, Hydrocodone, Pollen extract, Sodium ferric gluconate [ferrous gluconate], Nasacort [triamcinolone], and Vicodin [hydrocodone-acetaminophen] Current Outpatient Medications on File Prior to Visit  Medication Sig Dispense Refill  acetaminophen (TYLENOL) 325 MG tablet Take 2 tablets (650 mg total) by mouth every 6 (six) hours as needed for mild pain (or Fever >/= 101).     calcium citrate (CALCITRATE - DOSED IN MG ELEMENTAL CALCIUM) 950 (200 Ca) MG tablet Take 200 mg of elemental calcium by mouth daily.     fluticasone (FLONASE) 50 MCG/ACT nasal spray SPRAY 1 SPRAY INTO BOTH NOSTRILS DAILY. 16 mL 2   levothyroxine (SYNTHROID) 50 MCG tablet TAKE 1 TABLET (50 MCG TOTAL) BY MOUTH DAILY. DX CODE E03.9 90 tablet 1   Multiple Vitamin (MULTIVITAMIN WITH  MINERALS) TABS tablet Take 1 tablet by mouth daily.     pantoprazole (PROTONIX) 40 MG tablet Take 1 tablet (40 mg total) by mouth 2 (two) times daily. 180 tablet 3   sodium chloride (OCEAN) 0.65 % SOLN nasal spray Place 2 sprays into both nostrils as needed for congestion. 30 mL 2   triamterene-hydrochlorothiazide (DYAZIDE) 37.5-25 MG capsule TAKE 1 EACH (1 CAPSULE TOTAL) BY MOUTH DAILY. 90 capsule 2   No current facility-administered medications on file prior to visit.    Review of Systems  Constitutional:  Negative for chills and fever.  Respiratory:  Negative for cough.   Cardiovascular:  Negative for chest pain and palpitations.  Gastrointestinal:  Negative for nausea and vomiting.  Musculoskeletal:  Positive for back pain (chronic LBP).  Neurological:  Positive for numbness.      Objective:    BP 118/70   Pulse 65   Temp 98.4 F (36.9 C) (Oral)   Ht  (1.549 m)   Wt 182 lb (82.6 kg)   SpO2 98%   BMI 34.39 kg/m  BP Readings from Last 3 Encounters:  04/29/22 118/70  03/23/22 (!) 106/57  03/22/22 128/72   Wt Readings from Last 3 Encounters:  04/29/22 182 lb (82.6 kg)  03/23/22 185 lb (83.9 kg)  03/22/22 184 lb (83.5 kg)    Physical Exam Vitals reviewed.  Constitutional:      Appearance: She is well-developed.  Eyes:     Conjunctiva/sclera: Conjunctivae normal.  Cardiovascular:     Rate and Rhythm: Normal rate and regular rhythm.     Pulses: Normal pulses.     Heart sounds: Normal heart sounds.  Pulmonary:     Effort: Pulmonary effort is normal.     Breath sounds: Normal breath sounds. No wheezing, rhonchi or rales.  Musculoskeletal:     Right lower leg: No edema.     Left lower leg: No edema.  Feet:     Comments: Bandage right root Skin:    General: Skin is warm and dry.  Neurological:     Mental Status: She is alert.  Psychiatric:        Speech: Speech normal.        Behavior: Behavior normal.        Thought Content: Thought content normal.

## 2022-04-29 NOTE — Progress Notes (Signed)
BIRD, CRYMES ([id number]) [id number] Page 1 of 5 Visit Report for 04/29/2022 Arrival Information Details Patient Name: Date of Service: Jennifer [name] 04/29/2022 11:15 A M Medical Record Number: [id number] Patient Account Number: [id number] Date of Birth/Sex: Treating RN: Aug 25, 1950 (72 y.o. Jennifer [name] Primary Care Jennifer [name]: Jennifer [name] Other Clinician: Referring Jennifer [name]: Treating Jennifer [name]/Extender: Jennifer [name] in Treatment: 5 Visit Information History Since Last Visit Added or deleted any medications: No Patient Arrived: Cane Has Dressing in Place as Prescribed: Yes Arrival Time: 11:24 Pain Present Now: No Accompanied By: self Transfer Assistance: None Patient Identification Verified: Yes Secondary Verification Process Completed: Yes Patient Requires Transmission-Based Precautions: No Patient Has Alerts: Yes Patient Alerts: Not diabetic R ABI 1.20 TBI 1.02 L ABI 1.26 TBI 1.00 Electronic Signature(s) Signed: 04/29/2022 12:36:06 PM By: Jennifer [name], BSN, RN, CWS, Kim RN, BSN Entered By: Jennifer [name], BSN, RN, CWS, Jennifer [name] on 04/29/2022 11:25:06 -------------------------------------------------------------------------------- Lower Extremity Assessment Details Patient Name: Date of Service: Jennifer [name] [facility] 04/29/2022 11:15 A M Medical Record Number: [id number] Patient Account Number: [id number] Date of Birth/Sex: Treating RN: 1950/12/18 (72 y.o. Jennifer [name] Primary Care Jennifer [name]: Jennifer [name] Other Clinician: Referring Jennifer [name]: Treating Jennifer [name]/Extender: Jennifer [name] in Treatment: 5 Edema Assessment Assessed: [Left: No] [Right: Yes] Edema: [Left: Ye] [Right: s] Vascular Assessment Pulses: Dorsalis Pedis Palpable: [Right:Yes] Notes slight swelling and neuropathy in her feet. Electronic Signature(s) Signed: 04/29/2022 12:36:06 PM By: Jennifer [name], BSN, RN, CWS, Kim RN, BSN Entered By: Jennifer [name], BSN,  RN, CWS, Jennifer [name] on 04/29/2022 11:34:35 -------------------------------------------------------------------------------- Multi Wound Chart Details Patient Name: Date of Service: Jennifer [name] [name] 04/29/2022 11:15 A M Medical Record Number: [id number] Patient Account Number: [id number] Date of Birth/Sex: Treating RN: 07-29-1950 (72 y.o. Jennifer [name] Primary Care Jennifer [name]: Jennifer [name] Other Clinician: Referring Jennifer [name]: Treating Jennifer [name]/Extender: Jennifer [name] in Treatment: 5 Jennifer [name] ([id number]) [id number] Page 2 of 5 Vital Signs Height(in): 61 Pulse(bpm): 67 Weight(lbs): 185 Blood Pressure(mmHg): 120/69 Body Mass Index(BMI): 35 Temperature(F): Respiratory Rate(breaths/min): 18 [1:Photos:] [N/A:N/A] Right, Posterior Calcaneus N/A N/A Wound Location: Pressure Injury N/A N/A Wounding Event: Pressure Ulcer N/A N/A Primary Etiology: Cataracts, Hypertension, History of N/A N/A Comorbid History: pressure wounds, Neuropathy 01/13/2022 N/A N/A Date Acquired: 5 N/A N/A Weeks of Treatment: Open N/A N/A Wound Status: No N/A N/A Wound Recurrence: 0.9x0.6x0.2 N/A N/A Measurements L x W x D (cm) 0.424 N/A N/A A (cm) : rea 0.085 N/A N/A Volume (cm) : 10.00% N/A N/A % Reduction in A rea: -80.90% N/A N/A % Reduction in Volume: Category/Stage III N/A N/A Classification: Small N/A N/A Exudate A mount: Serous N/A N/A Exudate Type: amber N/A N/A Exudate Color: Distinct, outline attached N/A N/A Wound Margin: Medium (34-66%) N/A N/A Granulation A mount: Medium (34-66%) N/A N/A Necrotic A mount: Fat Layer (Subcutaneous Tissue): Yes N/A N/A Exposed Structures: Fascia: No Tendon: No Muscle: No Joint: No Bone: No Small (1-33%) N/A N/A Epithelialization: callus surrounding wound N/A N/A Assessment Notes: Treatment Notes Electronic Signature(s) Signed: 04/29/2022 12:36:06 PM By: Jennifer [name], BSN, RN, CWS, Kim  RN, BSN Entered By: Jennifer [name], BSN, RN, CWS, Jennifer [name] on 04/29/2022 11:48:03 -------------------------------------------------------------------------------- Multi-Disciplinary Care Plan Details Patient Name: Date of Service: Jennifer [name] Jennifer [name] 04/29/2022 11:15 A M Medical Record Number: [id number] Patient Account Number: [id number] Date of Birth/Sex: Treating RN: May 22, 1950 (72 y.o. Jennifer [name] Primary Care Jennifer [name]: Jennifer [name] Other Clinician: Referring Jennifer [name]: Treating Jennifer [name]/Extender: Jennifer [name] in Treatment: 5 Active Inactive Necrotic Tissue Nursing  Diagnoses: Impaired tissue integrity related to necrotic/devitalized tissue Knowledge deficit related to management of necrotic/devitalized tissue Jennifer, [name] ([id number]) [phone].pdf Page 3 of 5 Goals: Necrotic/devitalized tissue will be minimized in the wound bed Date Initiated: 03/29/2022 Target Resolution Date: 05/27/2022 Goal Status: Active Patient/caregiver will verbalize understanding of reason and process for debridement of necrotic tissue Date Initiated: 03/29/2022 Target Resolution Date: 05/27/2022 Goal Status: Active Interventions: Assess patient pain level pre-, during and post procedure and prior to discharge Provide education on necrotic tissue and debridement process Treatment Activities: Excisional debridement : 03/24/2022 Notes: Wound/Skin Impairment Nursing Diagnoses: Impaired tissue integrity Knowledge deficit related to ulceration/compromised skin integrity Goals: Patient will demonstrate a reduced rate of smoking or cessation of smoking Date Initiated: 03/29/2022 Target Resolution Date: 05/27/2022 Goal Status: Active Patient/caregiver will verbalize understanding of skin care regimen Date Initiated: 03/29/2022 Date Inactivated: 04/29/2022 Target Resolution Date: 04/28/2022 Goal Status: Met Ulcer/skin breakdown will have a volume reduction of 30% by  week 4 Date Initiated: 03/29/2022 Target Resolution Date: 05/26/2022 Goal Status: Active Ulcer/skin breakdown will have a volume reduction of 50% by week 8 Date Initiated: 03/29/2022 Target Resolution Date: 05/28/2022 Goal Status: Active Ulcer/skin breakdown will have a volume reduction of 80% by week 12 Date Initiated: 03/29/2022 Target Resolution Date: 06/28/2022 Goal Status: Active Ulcer/skin breakdown will heal within 14 weeks Date Initiated: 03/29/2022 Target Resolution Date: 07/12/2022 Goal Status: Active Interventions: Assess patient/caregiver ability to obtain necessary supplies Assess patient/caregiver ability to perform ulcer/skin care regimen upon admission and as needed Assess ulceration(s) every visit Provide education on ulcer and skin care Treatment Activities: Skin care regimen initiated : 03/24/2022 Notes: Electronic Signature(s) Signed: 04/29/2022 12:36:06 PM By: Jennifer [name], BSN, RN, CWS, Kim RN, BSN Entered By: Jennifer [name], BSN, RN, CWS, Jennifer [name] on 04/29/2022 11:54:54 -------------------------------------------------------------------------------- Pain Assessment Details Patient Name: Date of Service: Jennifer [name] 04/29/2022 11:15 A M Medical Record Number: [id number] Patient Account Number: [id number] Date of Birth/Sex: Treating RN: [birth year] (72 y.o. Jennifer [name] Primary Care Jevan Gaunt: Jennifer [name] Other Clinician: Referring Myracle Febres: Treating Alayziah Tangeman/Extender: Jennifer [name] in Treatment: 5 Active Problems Location of Pain Severity and Description of Pain Patient Has Paino No Site Locations [place], [place] ([id number]) [phone].pdf Page 4 of 5 Pain Management and Medication Current Pain Management: Electronic Signature(s) Signed: 04/29/2022 12:36:06 PM By: Jennifer [name], BSN, RN, CWS, Kim RN, BSN Entered By: Jennifer [name], BSN, RN, CWS, Jennifer [name] on 04/29/2022  11:27:27 -------------------------------------------------------------------------------- Patient/Caregiver Education Details Patient Name: Date of Service: Jennifer [name] 4/12/2024andnbsp11:15 A M Medical Record Number: [id number] Patient Account Number: [id number] Date of Birth/Gender: Treating RN: Jul 19, 1950 (72 y.o. Jennifer [name] Primary Care Physician: Jennifer [name] Other Clinician: Referring Physician: Treating Physician/Extender: Jennifer [name] in Treatment: 5 Education Assessment Education Provided To: Patient Education Topics Provided Wound Debridement: Handouts: Wound Debridement Methods: Demonstration, Explain/Verbal Responses: State content correctly Electronic Signature(s) Signed: 04/29/2022 12:36:06 PM By: Jennifer [name], BSN, RN, CWS, Kim RN, BSN Entered By: Jennifer [name], BSN, RN, CWS, Jennifer [name] on 04/29/2022 11:55:07 -------------------------------------------------------------------------------- Wound Assessment Details Patient Name: Date of Service: Jennifer [name] [facility] 04/29/2022 11:15 A M Medical Record Number: [id number] Patient Account Number: [id number] Date of Birth/Sex: Treating RN: Dec 18, 1950 (72 y.o. Jennifer [name] Primary Care Pj Zehner: Jennifer [name] Other Clinician: Referring Kerrie Latour: Treating Sheryn Aldaz/Extender: Jennifer [name] in Treatment: 5 Wound Status Wound Number: 1 Primary Pressure Ulcer Etiology: Wound Location: Right, Posterior Calcaneus Wound Status: Open Wounding Event: Pressure Injury Comorbid Cataracts, Hypertension, History of pressure wounds, Date Acquired: 01/13/2022 History:  Neuropathy Weeks Of Treatment: 5 [place], Jennifer [name] ([id number]) [id number] Page 5 of 5 Clustered Wound: No Photos Photo Uploaded By: Jennifer [name], BSN, RN, CWS, Jennifer [name] on 04/29/2022 11:37:27 Wound Measurements Length: (cm) 0.9 Width: (cm) 0.6 Depth: (cm) 0.2 Area: (cm) 0.424 Volume: (cm) 0.085 %  Reduction in Area: 10% % Reduction in Volume: -80.9% Epithelialization: Small (1-33%) Tunneling: No Undermining: No Wound Description Classification: Category/Stage III Wound Margin: Distinct, outline attached Exudate Amount: Small Exudate Type: Serous Exudate Color: amber Foul Odor After Cleansing: No Slough/Fibrino No Wound Bed Granulation Amount: Medium (34-66%) Exposed Structure Necrotic Amount: Medium (34-66%) Fascia Exposed: No Necrotic Quality: Adherent Slough Fat Layer (Subcutaneous Tissue) Exposed: Yes Tendon Exposed: No Muscle Exposed: No Joint Exposed: No Bone Exposed: No Assessment Notes callus surrounding wound Electronic Signature(s) Signed: 04/29/2022 12:36:06 PM By: Jennifer [name], BSN, RN, CWS, Kim RN, BSN Entered By: Jennifer [name], BSN, RN, CWS, Jennifer [name] on 04/29/2022 11:33:33 -------------------------------------------------------------------------------- Vitals Details Patient Name: Date of Service: Jennifer [name] [facility] [name] 04/29/2022 11:15 A M Medical Record Number: [id number] Patient Account Number: [id number] Date of Birth/Sex: Treating RN: September 09, 1950 (72 y.o. Cathlean Cower, Jennifer [name] Primary Care Rheanna Sergent: Jennifer [name] Other Clinician: Referring Teairra Millar: Treating Alyan Hartline/Extender: Jennifer [name] in Treatment: 5 Vital Signs Time Taken: 11:27 Pulse (bpm): 67 Height (in): 61 Respiratory Rate (breaths/min): 18 Weight (lbs): 185 Blood Pressure (mmHg): 120/69 Body Mass Index (BMI): 35 Reference Range: 80 - 120 mg / dl Electronic Signature(s) Signed: 04/29/2022 12:36:06 PM By: Jennifer [name], BSN, RN, CWS, Kim RN, BSN Entered By: Jennifer [name], BSN, RN, CWS, Jennifer [name] on 04/29/2022 11:27:20

## 2022-04-29 NOTE — Patient Instructions (Addendum)
You will need to wean off of gabapentin to start Lyrica.  Please decrease gabapentin 600mg  in the morning and in the evening for one week. Then you will decrease to gabapentin 300mg  in the morning and in the evening for one week.   While you are weaning off of gabapentin, you may start Lyrica 50 mg taken 3 times per day.  We will titrate doses from there.    Lyrica can be sedating.  Please let me know how you are doing.   Please call  and schedule your 3D mammogram and /or bone density scan as we discussed.   [facility]  ( new location in 2023)  [address], [place], [place] [address]  [place], [place]  [phone]

## 2022-04-29 NOTE — Assessment & Plan Note (Signed)
Suboptimal control.  Fortunately no history of PAD .03/23/2022, ABIs with toe brachial index normal bilaterally  Wean off of gabapentin, cross-taper with Lyrica 50 mg 3 times daily.  Follow-up in 6 weeks time.

## 2022-04-30 LAB — IRON,TIBC AND FERRITIN PANEL
%SAT: 13 % (calc) — ABNORMAL LOW (ref 16–45)
Ferritin: 59 ng/mL (ref 16–288)
Iron: 48 ug/dL (ref 45–160)
TIBC: 363 mcg/dL (calc) (ref 250–450)

## 2022-05-02 ENCOUNTER — Telehealth: Payer: Self-pay | Admitting: Family

## 2022-05-02 ENCOUNTER — Ambulatory Visit: Payer: Medicare Other

## 2022-05-02 DIAGNOSIS — G629 Polyneuropathy, unspecified: Secondary | ICD-10-CM

## 2022-05-02 NOTE — Telephone Encounter (Signed)
pt called stating the pregablin is not working and she would like to go back to gabapentin

## 2022-05-03 ENCOUNTER — Observation Stay: Payer: Medicare Other

## 2022-05-03 ENCOUNTER — Inpatient Hospital Stay
Admission: EM | Admit: 2022-05-03 | Discharge: 2022-05-05 | DRG: 393 | Disposition: A | Discharge status: Home / Self Care | Payer: Medicare Other | Attending: Internal Medicine | Admitting: Internal Medicine

## 2022-05-03 ENCOUNTER — Encounter: Payer: Self-pay | Admitting: Intensive Care

## 2022-05-03 ENCOUNTER — Other Ambulatory Visit: Payer: Self-pay

## 2022-05-03 ENCOUNTER — Emergency Department: Payer: Medicare Other

## 2022-05-03 DIAGNOSIS — Z9884 Bariatric surgery status: Secondary | ICD-10-CM

## 2022-05-03 DIAGNOSIS — E039 Hypothyroidism, unspecified: Secondary | ICD-10-CM | POA: Diagnosis present

## 2022-05-03 DIAGNOSIS — K219 Gastro-esophageal reflux disease without esophagitis: Secondary | ICD-10-CM | POA: Diagnosis present

## 2022-05-03 DIAGNOSIS — G4733 Obstructive sleep apnea (adult) (pediatric): Secondary | ICD-10-CM | POA: Diagnosis present

## 2022-05-03 DIAGNOSIS — R531 Weakness: Secondary | ICD-10-CM | POA: Diagnosis not present

## 2022-05-03 DIAGNOSIS — E669 Obesity, unspecified: Secondary | ICD-10-CM | POA: Diagnosis present

## 2022-05-03 DIAGNOSIS — Z885 Allergy status to narcotic agent status: Secondary | ICD-10-CM

## 2022-05-03 DIAGNOSIS — G629 Polyneuropathy, unspecified: Secondary | ICD-10-CM | POA: Diagnosis present

## 2022-05-03 DIAGNOSIS — R7401 Elevation of levels of liver transaminase levels: Secondary | ICD-10-CM | POA: Diagnosis present

## 2022-05-03 DIAGNOSIS — Z886 Allergy status to analgesic agent status: Secondary | ICD-10-CM

## 2022-05-03 DIAGNOSIS — K5900 Constipation, unspecified: Secondary | ICD-10-CM | POA: Diagnosis present

## 2022-05-03 DIAGNOSIS — D649 Anemia, unspecified: Secondary | ICD-10-CM | POA: Insufficient documentation

## 2022-05-03 DIAGNOSIS — Z79899 Other long term (current) drug therapy: Secondary | ICD-10-CM

## 2022-05-03 DIAGNOSIS — Z888 Allergy status to other drugs, medicaments and biological substances status: Secondary | ICD-10-CM

## 2022-05-03 DIAGNOSIS — R42 Dizziness and giddiness: Secondary | ICD-10-CM | POA: Diagnosis not present

## 2022-05-03 DIAGNOSIS — K760 Fatty (change of) liver, not elsewhere classified: Secondary | ICD-10-CM | POA: Diagnosis not present

## 2022-05-03 DIAGNOSIS — Z87891 Personal history of nicotine dependence: Secondary | ICD-10-CM

## 2022-05-03 DIAGNOSIS — F1010 Alcohol abuse, uncomplicated: Secondary | ICD-10-CM | POA: Diagnosis present

## 2022-05-03 DIAGNOSIS — F1090 Alcohol use, unspecified, uncomplicated: Secondary | ICD-10-CM | POA: Diagnosis present

## 2022-05-03 DIAGNOSIS — E876 Hypokalemia: Secondary | ICD-10-CM | POA: Diagnosis not present

## 2022-05-03 DIAGNOSIS — R231 Pallor: Secondary | ICD-10-CM | POA: Diagnosis not present

## 2022-05-03 DIAGNOSIS — E871 Hypo-osmolality and hyponatremia: Secondary | ICD-10-CM | POA: Diagnosis present

## 2022-05-03 DIAGNOSIS — D62 Acute posthemorrhagic anemia: Secondary | ICD-10-CM | POA: Diagnosis present

## 2022-05-03 DIAGNOSIS — M1990 Unspecified osteoarthritis, unspecified site: Secondary | ICD-10-CM | POA: Diagnosis present

## 2022-05-03 DIAGNOSIS — R55 Syncope and collapse: Secondary | ICD-10-CM | POA: Diagnosis present

## 2022-05-03 DIAGNOSIS — Q438 Other specified congenital malformations of intestine: Secondary | ICD-10-CM

## 2022-05-03 DIAGNOSIS — Z79890 Hormone replacement therapy (postmenopausal): Secondary | ICD-10-CM

## 2022-05-03 DIAGNOSIS — R2681 Unsteadiness on feet: Secondary | ICD-10-CM | POA: Diagnosis not present

## 2022-05-03 DIAGNOSIS — Z6834 Body mass index (BMI) 34.0-34.9, adult: Secondary | ICD-10-CM

## 2022-05-03 DIAGNOSIS — L89613 Pressure ulcer of right heel, stage 3: Secondary | ICD-10-CM | POA: Diagnosis present

## 2022-05-03 DIAGNOSIS — K922 Gastrointestinal hemorrhage, unspecified: Secondary | ICD-10-CM | POA: Diagnosis not present

## 2022-05-03 DIAGNOSIS — Z8616 Personal history of COVID-19: Secondary | ICD-10-CM

## 2022-05-03 DIAGNOSIS — Z9109 Other allergy status, other than to drugs and biological substances: Secondary | ICD-10-CM

## 2022-05-03 DIAGNOSIS — I10 Essential (primary) hypertension: Secondary | ICD-10-CM | POA: Diagnosis present

## 2022-05-03 DIAGNOSIS — T502X5A Adverse effect of carbonic-anhydrase inhibitors, benzothiadiazides and other diuretics, initial encounter: Secondary | ICD-10-CM | POA: Diagnosis present

## 2022-05-03 DIAGNOSIS — R1011 Right upper quadrant pain: Secondary | ICD-10-CM | POA: Diagnosis present

## 2022-05-03 DIAGNOSIS — Z8249 Family history of ischemic heart disease and other diseases of the circulatory system: Secondary | ICD-10-CM

## 2022-05-03 DIAGNOSIS — R0602 Shortness of breath: Secondary | ICD-10-CM | POA: Diagnosis not present

## 2022-05-03 DIAGNOSIS — Z743 Need for continuous supervision: Secondary | ICD-10-CM | POA: Diagnosis not present

## 2022-05-03 DIAGNOSIS — K921 Melena: Secondary | ICD-10-CM | POA: Diagnosis not present

## 2022-05-03 DIAGNOSIS — R32 Unspecified urinary incontinence: Secondary | ICD-10-CM | POA: Diagnosis not present

## 2022-05-03 DIAGNOSIS — K314 Gastric diverticulum: Secondary | ICD-10-CM | POA: Diagnosis present

## 2022-05-03 DIAGNOSIS — K641 Second degree hemorrhoids: Principal | ICD-10-CM | POA: Diagnosis present

## 2022-05-03 DIAGNOSIS — Z803 Family history of malignant neoplasm of breast: Secondary | ICD-10-CM

## 2022-05-03 DIAGNOSIS — F419 Anxiety disorder, unspecified: Secondary | ICD-10-CM | POA: Diagnosis present

## 2022-05-03 DIAGNOSIS — D509 Iron deficiency anemia, unspecified: Secondary | ICD-10-CM | POA: Diagnosis present

## 2022-05-03 DIAGNOSIS — R6889 Other general symptoms and signs: Secondary | ICD-10-CM | POA: Diagnosis not present

## 2022-05-03 DIAGNOSIS — A6009 Herpesviral infection of other urogenital tract: Secondary | ICD-10-CM | POA: Diagnosis present

## 2022-05-03 DIAGNOSIS — E7800 Pure hypercholesterolemia, unspecified: Secondary | ICD-10-CM | POA: Diagnosis present

## 2022-05-03 LAB — PROTIME-INR
INR: 1.3 — ABNORMAL HIGH (ref 0.8–1.2)
Prothrombin Time: 16.4 seconds — ABNORMAL HIGH (ref 11.4–15.2)

## 2022-05-03 LAB — TYPE AND SCREEN
Antibody Screen: NEGATIVE
Unit division: 0

## 2022-05-03 LAB — HEMOGLOBIN AND HEMATOCRIT, BLOOD
HCT: 23.1 % — ABNORMAL LOW (ref 36.0–46.0)
Hemoglobin: 7.8 g/dL — ABNORMAL LOW (ref 12.0–15.0)

## 2022-05-03 LAB — BASIC METABOLIC PANEL
Anion gap: 13 (ref 5–15)
BUN: 11 mg/dL (ref 8–23)
CO2: 18 mmol/L — ABNORMAL LOW (ref 22–32)
Calcium: 7.7 mg/dL — ABNORMAL LOW (ref 8.9–10.3)
Chloride: 99 mmol/L (ref 98–111)
Creatinine, Ser: 0.63 mg/dL (ref 0.44–1.00)
GFR, Estimated: >60 mL/min (ref >60)
Glucose, Bld: 130 mg/dL — ABNORMAL HIGH (ref 70–99)
Potassium: 4.6 mmol/L (ref 3.5–5.1)
Sodium: 130 mmol/L — ABNORMAL LOW (ref 135–145)

## 2022-05-03 LAB — HEPATIC FUNCTION PANEL
ALT: 59 U/L — ABNORMAL HIGH (ref 0–44)
AST: 300 U/L — ABNORMAL HIGH (ref 15–41)
Albumin: 3.2 g/dL — ABNORMAL LOW (ref 3.5–5.0)
Alkaline Phosphatase: 175 U/L — ABNORMAL HIGH (ref 38–126)
Bilirubin, Direct: 0.5 mg/dL — ABNORMAL HIGH (ref 0.0–0.2)
Indirect Bilirubin: 0.8 mg/dL (ref 0.3–0.9)
Total Bilirubin: 1.3 mg/dL — ABNORMAL HIGH (ref 0.3–1.2)
Total Protein: 6 g/dL — ABNORMAL LOW (ref 6.5–8.1)

## 2022-05-03 LAB — BPAM RBC

## 2022-05-03 LAB — URINALYSIS, ROUTINE W REFLEX MICROSCOPIC
Bilirubin Urine: NEGATIVE
Glucose, UA: NEGATIVE mg/dL
Hgb urine dipstick: NEGATIVE
Ketones, ur: 5 mg/dL — AB
Leukocytes,Ua: NEGATIVE
Nitrite: NEGATIVE
Protein, ur: NEGATIVE mg/dL
Specific Gravity, Urine: 1.015 (ref 1.005–1.030)
pH: 5 (ref 5.0–8.0)

## 2022-05-03 LAB — CBC
HCT: 24.1 % — ABNORMAL LOW (ref 36.0–46.0)
Hemoglobin: 7.9 g/dL — ABNORMAL LOW (ref 12.0–15.0)
MCH: 29.9 pg (ref 26.0–34.0)
MCHC: 32.8 g/dL (ref 30.0–36.0)
MCV: 91.3 fL (ref 80.0–100.0)
Platelets: 133 10*3/uL — ABNORMAL LOW (ref 150–400)
RBC: 2.64 MIL/uL — ABNORMAL LOW (ref 3.87–5.11)
RDW: 13.8 % (ref 11.5–15.5)
WBC: 6.1 10*3/uL (ref 4.0–10.5)
nRBC: 0 % (ref 0.0–0.2)

## 2022-05-03 LAB — TROPONIN I (HIGH SENSITIVITY)
Troponin I (High Sensitivity): 5 ng/L (ref <18)
Troponin I (High Sensitivity): 5 ng/L (ref <18)

## 2022-05-03 LAB — PREPARE RBC (CROSSMATCH)

## 2022-05-03 MED ORDER — PREGABALIN 50 MG PO CAPS
50.0000 mg | ORAL_CAPSULE | Freq: Three times a day (TID) | ORAL | Status: DC
  Administered 2022-05-03 – 2022-05-04 (×2): 50 mg via ORAL
  Filled 2022-05-03 (×2): qty 1

## 2022-05-03 MED ORDER — ACETAMINOPHEN 650 MG RE SUPP: 650.0000 mg | Freq: Four times a day (QID) | RECTAL | Status: DC | PRN

## 2022-05-03 MED ORDER — IOHEXOL 350 MG/ML SOLN
100.0000 mL | Freq: Once | INTRAVENOUS | Status: AC | PRN
  Administered 2022-05-03: 100 mL via INTRAVENOUS

## 2022-05-03 MED ORDER — SODIUM CHLORIDE 0.9% IV SOLUTION: Freq: Once | INTRAVENOUS | Status: AC

## 2022-05-03 MED ORDER — LORAZEPAM 2 MG/ML IJ SOLN
1.0000 mg | INTRAMUSCULAR | Status: DC | PRN
  Administered 2022-05-03: 1 mg via INTRAVENOUS
  Filled 2022-05-03: qty 1

## 2022-05-03 MED ORDER — ONDANSETRON HCL 4 MG PO TABS: 4.0000 mg | ORAL_TABLET | Freq: Four times a day (QID) | ORAL | Status: DC | PRN

## 2022-05-03 MED ORDER — ONDANSETRON HCL 4 MG/2ML IJ SOLN
4.0000 mg | Freq: Once | INTRAMUSCULAR | Status: AC | PRN
  Administered 2022-05-03: 4 mg via INTRAVENOUS
  Filled 2022-05-03: qty 2

## 2022-05-03 MED ORDER — ONDANSETRON 4 MG PO TBDP
4.0000 mg | ORAL_TABLET | Freq: Once | ORAL | Status: DC
  Filled 2022-05-03: qty 1

## 2022-05-03 MED ORDER — SODIUM CHLORIDE 0.9 % IV BOLUS
500.0000 mL | Freq: Once | INTRAVENOUS | Status: AC
  Administered 2022-05-03: 500 mL via INTRAVENOUS

## 2022-05-03 MED ORDER — PANTOPRAZOLE 80MG IVPB - SIMPLE MED
80.0000 mg | Freq: Once | INTRAVENOUS | Status: AC
  Administered 2022-05-03: 80 mg via INTRAVENOUS
  Filled 2022-05-03: qty 100

## 2022-05-03 MED ORDER — ONDANSETRON HCL 4 MG/2ML IJ SOLN: 4.0000 mg | Freq: Four times a day (QID) | INTRAMUSCULAR | Status: DC | PRN

## 2022-05-03 MED ORDER — LORAZEPAM 1 MG PO TABS: 1.0000 mg | ORAL_TABLET | ORAL | Status: DC | PRN

## 2022-05-03 MED ORDER — TRAZODONE HCL 50 MG PO TABS
50.0000 mg | ORAL_TABLET | Freq: Every evening | ORAL | Status: DC | PRN
  Administered 2022-05-03 – 2022-05-04 (×2): 50 mg via ORAL
  Filled 2022-05-03 (×2): qty 1

## 2022-05-03 MED ORDER — THIAMINE HCL 100 MG/ML IJ SOLN
100.0000 mg | Freq: Every day | INTRAMUSCULAR | Status: DC
  Administered 2022-05-03 – 2022-05-05 (×3): 100 mg via INTRAVENOUS
  Filled 2022-05-03 (×3): qty 2

## 2022-05-03 MED ORDER — LEVOTHYROXINE SODIUM 50 MCG PO TABS
50.0000 ug | ORAL_TABLET | Freq: Every day | ORAL | Status: DC
  Administered 2022-05-04: 50 ug via ORAL
  Filled 2022-05-03: qty 1

## 2022-05-03 MED ORDER — PANTOPRAZOLE SODIUM 40 MG IV SOLR: 40.0000 mg | Freq: Two times a day (BID) | INTRAVENOUS | Status: DC

## 2022-05-03 MED ORDER — PANTOPRAZOLE INFUSION (NEW) - SIMPLE MED
8.0000 mg/h | INTRAVENOUS | Status: DC
  Administered 2022-05-03 – 2022-05-04 (×2): 8 mg/h via INTRAVENOUS
  Filled 2022-05-03 (×3): qty 100

## 2022-05-03 MED ORDER — SODIUM CHLORIDE 0.9 % IV SOLN: INTRAVENOUS | Status: AC

## 2022-05-03 MED ORDER — ACETAMINOPHEN 325 MG PO TABS: 650.0000 mg | ORAL_TABLET | Freq: Four times a day (QID) | ORAL | Status: DC | PRN

## 2022-05-03 NOTE — H&P (Addendum)
History and Physical:    Jennifer [name]   ZOX:[id number] DOB: 02/22/1950 DOA: 05/03/2022  Referring MD/provider: Greig Right, PA PCP: Allegra Grana, FNP   Patient coming from: Home  Chief Complaint: Near syncope  History of Present Illness:   Jennifer [name] is a 72 y.o. female with multiple medical problems including iron deficiency anemia, hemorrhoids, hypertension, hyperlipidemia, peripheral neuropathy, sleep apnea, herpes genitalis, constipation, anxiety, s/p bariatric surgery, who presented to the hospital with near syncope.  She said she was  at [employer] when she felt that she was "about to faint".  She held onto the cart and the cashier sat her down near the counter.  This was associated with dizziness, general weakness, diaphoresis and urinary incontinence.  She did not lose consciousness.  She also complained of dark black stools of about 4 days duration.  She also complains of right upper quadrant abdominal pain.  She said she fell about a year ago and she has noticed "a knot".  She complained of chest pressure that has been going on for about 2 days now.  She vomited some "mucus", when she got to the ED.  No hematemesis.  She has history of iron deficiency anemia and stopped IV iron in the past.  She has not used any aspirin, blood thinners or NSAIDs.   ED Course:  The patient was given IV Zofran in the emergency department  ROS:   ROS all other systems reviewed were negative.  Past Medical History:   Past Medical History:  Diagnosis Date   Anemia    Anxiety    Bariatric surgery status    Complication of anesthesia    Diificulty breathing for about 15 minutes after bariatric surgery   Constipation    COVID-19    Dysrhythmia    Elevated liver enzymes    GERD (gastroesophageal reflux disease)    Hemorrhoids    Herpes genitalis    High cholesterol    Hyperlipidemia    Hypertension    Hypothyroidism    IDA (iron deficiency anemia) 02/09/2021    Neuropathy    Osteoarthritis    Sleep apnea    CPAP    Past Surgical History:   Past Surgical History:  Procedure Laterality Date   ABDOMINAL HYSTERECTOMY     total for fibroids no h/o abnormal pap   bariatric sleeve  2015   BREAST EXCISIONAL BIOPSY Left 1998   carpal tunnel repair     CATARACT EXTRACTION W/PHACO Right 11/01/2021   Procedure: CATARACT EXTRACTION PHACO AND INTRAOCULAR LENS PLACEMENT (IOC) RIGHT;  Surgeon: Nevada Crane, MD;  Location: [facility];  Service: Ophthalmology;  Laterality: Right;  sleep apnea 4.95 00:49.7   CATARACT EXTRACTION W/PHACO Left 11/15/2021   Procedure: CATARACT EXTRACTION PHACO AND INTRAOCULAR LENS PLACEMENT (IOC) LEFT 4.94 00:32.3;  Surgeon: Nevada Crane, MD;  Location: [facility];  Service: Ophthalmology;  Laterality: Left;  sleep apnea   COLONOSCOPY WITH PROPOFOL N/A 02/11/2016   Procedure: COLONOSCOPY WITH PROPOFOL;  Surgeon: Wyline Mood, MD;  Location: [facility] ENDOSCOPY;  Service: Endoscopy;  Laterality: N/A;   COLONOSCOPY WITH PROPOFOL N/A 10/01/2019   Procedure: COLONOSCOPY WITH PROPOFOL;  Surgeon: Wyline Mood, MD;  Location: [facility] END[facility]COPY;  Service: Gastroenterology;  Laterality: N/A;   COLONOSCOPY WITH PROPOFOL N/A 11/19/2020   Procedure: COLONOSCOPY WITH PROPOFOL;  Surgeon: Wyline Mood, MD;  Location: [facility];  Service: Gastroenterology;  Laterality: N/A;   ESOPHAGOGASTRODUODENOSCOPY (EGD) WITH PROPOFOL N/A 12/02/2019   Procedure: ESOPHAGOGASTRODUODENOSCOPY (EGD)  WITH PROPOFOL;  Surgeon: Wyline Mood, MD;  Location: [facility];  Service: Gastroenterology;  Laterality: N/A;   ESOPHAGOGASTRODUODENOSCOPY (EGD) WITH PROPOFOL N/A 11/19/2020   Procedure: ESOPHAGOGASTRODUODENOSCOPY (EGD) WITH PROPOFOL;  Surgeon: Wyline Mood, MD;  Location: [facility]COPY;  Service: Gastroenterology;  Laterality: N/A;   FLEXIBLE SIGMOIDOSCOPY N/A 02/06/2021   Procedure: FLEXIBLE SIGMOIDOSCOPY;  Surgeon: Jaynie Collins,  DO;  Location: [facility] ENDOSCOPY;  Service: Gastroenterology;  Laterality: N/A;   GIVENS CAPSULE STUDY N/A 06/07/2021   Procedure: GIVENS CAPSULE STUDY;  Surgeon: Toney Reil, MD;  Location: [facility] ENDOSCOPY;  Service: Gastroenterology;  Laterality: N/A;   GIVENS CAPSULE STUDY N/A 06/09/2021   Procedure: GIVENS CAPSULE STUDY;  Surgeon: Toney Reil, MD;  Location: [facility] ENDOSCOPY;  Service: Gastroenterology;  Laterality: N/A;   HEMORRHOID SURGERY     LAPAROSCOPIC GASTRIC RESTRICTIVE DUODENAL PROCEDURE (DUODENAL SWITCH) Bilateral    2020    Social History:   Social History   Socioeconomic History   Marital status: Married    Spouse name: Not on file   Number of children: Not on file   Years of education: Not on file   Highest education level: Not on file  Occupational History   Not on file  Tobacco Use   Smoking status: Former    Packs/day: 1.50    Years: 24.00    Additional pack years: 0.00    Total pack years: 36.00    Types: Cigarettes    Quit date: 54    Years since quitting: 31.3   Smokeless tobacco: Never   Tobacco comments:    quit 1995.   Vaping Use   Vaping Use: Never used  Substance and Sexual Activity   Alcohol use: Not Currently    Alcohol/week: 14.0 standard drinks of alcohol    Types: 14 Glasses of wine per week   Drug use: No   Sexual activity: Not Currently    Birth control/protection: Surgical    Comment: Hysterectomy  Other Topics Concern   Not on file  Social History Narrative   Lives in [place].    Married.    Retired 2015, Engineer, structural.    One son; granddaughter.    Left handed    Caffeine- decaf coffee.    Social Determinants of Health   Financial Resource Strain: Low Risk  (09/28/2021)   Overall Financial Resource Strain (CARDIA)    Difficulty of Paying Living Expenses: Not hard at all  Food Insecurity: No Food Insecurity (09/28/2021)   Hunger Vital Sign    Worried About Running Out of Food in the Last Year: Never  true    Ran Out of Food in the Last Year: Never true  Transportation Needs: No Transportation Needs (09/28/2021)   PRAPARE - Administrator, Civil Service (Medical): No    Lack of Transportation (Non-Medical): No  Physical Activity: Insufficiently Active (09/06/2019)   Exercise Vital Sign    Days of Exercise per Week: 3 days    Minutes of Exercise per Session: 20 min  Stress: No Stress Concern Present (09/28/2021)   [employer] of Occupational Health - Occupational Stress Questionnaire    Feeling of Stress : Not at all  Social Connections: Unknown (09/28/2021)   Social Connection and Isolation Panel [NHANES]    Frequency of Communication with Friends and Family: More than three times a week    Frequency of Social Gatherings with Friends and Family: More than three times a week    Attends Religious Services:  Not on file    Active Member of Clubs or Organizations: Not on file    Attends Club or Organization Meetings: Not on file    Marital Status: Married  Intimate Partner Violence: Not At Risk (09/28/2021)   Humiliation, Afraid, Rape, and Kick questionnaire    Fear of Current or Ex-Partner: No    Emotionally Abused: No    Physically Abused: No    Sexually Abused: No    Allergies   Bee pollen, Celecoxib, Hydrocodone, Pollen extract, Sodium ferric gluconate [ferrous gluconate], Nasacort [triamcinolone], and Vicodin [hydrocodone-acetaminophen]  Family history:   Family History  Problem Relation Age of Onset   Breast cancer Sister 76       materal 1/2 sister   Hypertension Sister    Hypertension Mother    Heart disease Father    Hypertension Brother     Current Medications:   Prior to Admission medications   Medication Sig Start Date End Date Taking? Authorizing Provider  acetaminophen (TYLENOL) 325 MG tablet Take 2 tablets (650 mg total) by mouth every 6 (six) hours as needed for mild pain (or Fever >/= 101). 06/12/20   Lynn Ito, MD  acyclovir (ZOVIRAX)  400 MG tablet Take 1 tablet (400 mg total) by mouth daily. 04/29/22   Allegra Grana, FNP  calcium citrate (CALCITRATE - DOSED IN MG ELEMENTAL CALCIUM) 950 (200 Ca) MG tablet Take 200 mg of elemental calcium by mouth daily.    [provider]  fluticasone (FLONASE) 50 MCG/ACT nasal spray SPRAY 1 SPRAY INTO BOTH NOSTRILS DAILY. 01/12/22   Allegra Grana, FNP  hydrocortisone (ANUSOL-HC) 25 MG suppository Unwrap and insert 1 suppository rectally twice a day 04/29/22   Allegra Grana, FNP  levothyroxine (SYNTHROID) 50 MCG tablet TAKE 1 TABLET (50 MCG TOTAL) BY MOUTH DAILY. DX CODE E03.9 03/18/22   Allegra Grana, FNP  losartan (COZAAR) 100 MG tablet TAKE 1 TABLET BY MOUTH EVERYDAY AT BEDTIME 04/29/22   Allegra Grana, FNP  Multiple Vitamin (MULTIVITAMIN WITH MINERALS) TABS tablet Take 1 tablet by mouth daily.    [provider]  pantoprazole (PROTONIX) 40 MG tablet Take 1 tablet (40 mg total) by mouth 2 (two) times daily. 09/22/21   Wyline Mood, MD  pregabalin (LYRICA) 50 MG capsule Take 1 capsule (50 mg total) by mouth 3 (three) times daily. 04/29/22   Allegra Grana, FNP  sodium chloride (OCEAN) 0.65 % SOLN nasal spray Place 2 sprays into both nostrils as needed for congestion. 01/21/21   McLean-Scocuzza, Pasty Spillers, MD  traZODone (DESYREL) 50 MG tablet for sleep DX Code G47.00 04/29/22   Allegra Grana, FNP  triamterene-hydrochlorothiazide (DYAZIDE) 37.5-25 MG capsule TAKE 1 EACH (1 CAPSULE TOTAL) BY MOUTH DAILY. 03/17/22   Allegra Grana, FNP    Physical Exam:   Vitals:   05/03/22 1635 05/03/22 1645 05/03/22 1700 05/03/22 1709  BP:   (!) 145/65   Pulse: 67  79 72  Resp: 16 (!) 22  17  Temp:      TempSrc:      SpO2: 99% 100%  100%  Weight:      Height:         Physical Exam: Blood pressure (!) 145/65, pulse 72, temperature 98.6 F (37 C), temperature source Oral, resp. rate 17, height  (1.549 m), weight 82.6 kg, SpO2 100 %. Gen: No acute  distress. Head: Normocephalic, atraumatic. Eyes: Pupils equal, round and reactive to light. Extraocular movements intact.  Sclerae nonicteric.  Mouth: Dry mucous membranes Neck: Supple, no thyromegaly, no lymphadenopathy, no jugular venous distention. Chest: Lungs are clear to auscultation with good air movement. No rales, rhonchi or wheezes.  CV: Heart sounds are regular with an S1, S2. No murmurs, rubs or gallops.  Abdomen: Soft, right upper quadrant tenderness,?  lump was felt in the right upper quadrant below the rib cage, obese with normal active bowel sounds. No palpable masses. Extremities: Extremities are without clubbing, or cyanosis. No edema. Pedal pulses 2+.  Skin: Warm and dry. No rashes, lesions or wounds Neuro: Alert and oriented times 3; grossly nonfocal.  Psych: Insight is good and judgment is appropriate. Mood and affect normal.   Data Review:    Labs: Basic Metabolic Panel: Recent Labs  Lab 04/29/22 0848 05/03/22 1312  NA 133* 130*  K 4.3 4.6  CL 97 99  CO2 25 18*  GLUCOSE 93 130*  BUN 9 11  CREATININE 0.86 0.63  CALCIUM 8.9 7.7*   Liver Function Tests: Recent Labs  Lab 04/29/22 0848  AST 343*  ALT 69*  ALKPHOS 243*  BILITOT 0.9  PROT 6.7  ALBUMIN 4.0   No results for input(s): "LIPASE", "AMYLASE" in the last 168 hours. No results for input(s): "AMMONIA" in the last 168 hours. CBC: Recent Labs  Lab 04/29/22 0848 05/03/22 1312  WBC 5.6 6.1  NEUTROABS 3.1  --   HGB 11.2* 7.9*  HCT 34.2* 24.1*  MCV 94.2 91.3  PLT 176.0 133*   Cardiac Enzymes: No results for input(s): "CKTOTAL", "CKMB", "CKMBINDEX", "TROPONINI" in the last 168 hours.  BNP (last 3 results) No results for input(s): "PROBNP" in the last 8760 hours. CBG: No results for input(s): "GLUCAP" in the last 168 hours.  Urinalysis    Component Value Date/Time   COLORURINE YELLOW (A) 06/10/2020 0000   APPEARANCEUR CLEAR (A) 06/10/2020 0000   LABSPEC 1.023 06/10/2020 0000    PHURINE 6.0 06/10/2020 0000   GLUCOSEU NEGATIVE 06/10/2020 0000   GLUCOSEU NEGATIVE 03/03/2017 0830   HGBUR NEGATIVE 06/10/2020 0000   BILIRUBINUR NEGATIVE 06/10/2020 0000   KETONESUR NEGATIVE 06/10/2020 0000   PROTEINUR NEGATIVE 06/10/2020 0000   UROBILINOGEN 0.2 03/03/2017 0830   NITRITE NEGATIVE 06/10/2020 0000   LEUKOCYTESUR NEGATIVE 06/10/2020 0000      Radiographic Studies: DG Chest 2 View  Result Date: 05/03/2022 CLINICAL DATA:  Shortness of breath EXAM: CHEST - 2 VIEW COMPARISON:  11/17/2021 FINDINGS: The heart size and mediastinal contours are within normal limits. Both lungs are clear. The visualized skeletal structures are unremarkable. A thin linear radiodensity projects over the left shoulder on the frontal view and is presumably external to the patient. IMPRESSION: No active cardiopulmonary disease. Electronically Signed   By: Duanne Guess D.O.   On: 05/03/2022 15:22   CT Head Wo Contrast  Result Date: 05/03/2022 CLINICAL DATA:  Vertigo EXAM: CT HEAD WITHOUT CONTRAST TECHNIQUE: Contiguous axial images were obtained from the base of the skull through the vertex without intravenous contrast. RADIATION DOSE REDUCTION: This exam was performed according to the departmental dose-optimization program which includes automated exposure control, adjustment of the mA and/or kV according to patient size and/or use of iterative reconstruction technique. COMPARISON:  CT head 09/27/21 FINDINGS: Brain: No evidence of acute infarction, hemorrhage, hydrocephalus, extra-axial collection or mass lesion/mass effect. Vascular: No hyperdense vessel or unexpected calcification. Skull: Normal. Negative for fracture or focal lesion. Sinuses/Orbits: No middle ear or mastoid effusion. Paranasal sinuses are clear. Bilateral lens replacement. Orbits otherwise  unremarkable. Other: None. IMPRESSION: No acute intracranial abnormality. Electronically Signed   By: Lorenza Cambridge M.D.   On: 05/03/2022 14:33     EKG: Independently reviewed by me showed normal sinus rhythm.    Assessment/Plan:   Principal Problem:   Acute on chronic anemia Active Problems:   Acute blood loss anemia   Melena   Alcohol use disorder     Body mass index is 34.39 kg/m.  (Obesity)   Black stool/melena: Admit to MedSurg.  Monitor on telemetry.  Make NPO.  Start IV fluids for hydration.  Start IV Protonix bolus followed by IV Protonix infusion.  Obtain CTA angiogram of the abdomen and pelvis for further evaluation.  Consulted Dr. Mia Creek, gastroenterologist, to assist with management.  Hold losartan and HCTZ for now. She has history of gastritis, duodenitis, GERD, hemorrhoids.   Acute blood loss anemia, chronic iron deficiency anemia: Hemoglobin is 7.9.  Hemoglobin was 11.2 on 04/29/2022.  Transfuse 1 unit of packed red blood cells.  Risks, benefits and alternatives of blood transfusion were discussed and she is agreeable to blood transfusion.    Near syncope, dizziness and chest pressure: The symptoms are likely due to anemia.  Troponin is negative.   Right upper quadrant pain/lump: CT abdomen pelvis has been ordered.   Alcohol use disorder: Use Ativan as needed per CIWA protocol.  Counseled to quit using alcohol.   Hyponatremia: Treat with IV fluids.  Monitor BMP.   Other comorbidities include hypertension, OSA on CPAP, s/p bariatric surgery, hypothyroidism    Other information:   DVT prophylaxis: SCD  Code Status: Full code (this was discussed with the patient). Family Communication: None  Disposition Plan: Plan to discharge home in 1 to 2 days Consults called: Dr. Mia Creek, gastroenterologist Admission status: Observation   Uriah Trueba [facility] Pager: Please check [email or web address]   How to contact the [facility] Attending or Consulting provider 7A - 7P or covering provider during after hours 7P -7A, for this patient?   Check the care team in [facility] and look for a)  attending/consulting [facility] provider listed and b) the [facility] team listed Log into [email or web address] and use [place]'s universal password to access. If you do not have the password, please contact the hospital operator. Locate the [facility] provider you are looking for under [facility] and page to a number that you can be directly reached. If you still have difficulty reaching the provider, please page the [facility] (Director on Call) for the Hospitalists listed on amion for assistance.  05/03/2022, 5:41 PM

## 2022-05-03 NOTE — ED Notes (Signed)
Assisted pt up to restroom using wheelchair; pt given urine specimen cup and hat.

## 2022-05-03 NOTE — ED Notes (Signed)
Called floor to notify room 133 assigned but no nurse/green man noted. State ready to receive pt soon and nurse will be [facility]

## 2022-05-03 NOTE — ED Provider Notes (Signed)
[facility] Provider Note    Event Date/Time   First MD Initiated Contact with Patient 05/03/22 1618     (approximate)   History   Near Syncope   HPI  Jennifer [name] is a 72 y.o. female with history of hypertension, hypothyroidism, hyperlipidemia, arrhythmia, GERD, anemia presents emergency department with near syncope.  Patient was pushing a cart at [employer] while shopping.  States that she became very lightheaded sweaty dizzy and almost passed out and fell.  But she did not fall as she was holding onto the cart.  States he had to have some heaviness in her chest.      Physical Exam   Triage Vital Signs: ED Triage Vitals  Enc Vitals Group     BP 05/03/22 1307 110/68     Pulse Rate 05/03/22 1307 70     Resp 05/03/22 1307 18     Temp 05/03/22 1307 98 F (36.7 C)     Temp Source 05/03/22 1307 Oral     SpO2 05/03/22 1307 99 %     Weight 05/03/22 1304 182 lb (82.6 kg)     Height 05/03/22 1304  (1.549 m)     Head Circumference --      Peak Flow --      Pain Score 05/03/22 1304 0     Pain Loc --      Pain Edu? --      Excl. in GC? --     Most recent vital signs: Vitals:   05/03/22 1633 05/03/22 1635  BP: (!) 129/57   Pulse:  67  Resp:  16  Temp:    SpO2:  99%     General: Awake, no distress.   CV:  Good peripheral perfusion. regular rate and  rhythm Resp:  Normal effort. Lungs cta Abd:  No distention.  Nontender Other:      ED Results / Procedures / Treatments   Labs (all labs ordered are listed, but only abnormal results are displayed) Labs Reviewed  BASIC METABOLIC PANEL - Abnormal; Notable for the following components:      Result Value   Sodium 130 (*)    CO2 18 (*)    Glucose, Bld 130 (*)    Calcium 7.7 (*)    All other components within normal limits  CBC - Abnormal; Notable for the following components:   RBC 2.64 (*)    Hemoglobin 7.9 (*)    HCT 24.1 (*)    Platelets 133 (*)    All other components  within normal limits  URINALYSIS, ROUTINE W REFLEX MICROSCOPIC  CBG MONITORING, ED  TYPE AND SCREEN  TROPONIN I (HIGH SENSITIVITY)  TROPONIN I (HIGH SENSITIVITY)     EKG  EKG   RADIOLOGY CT of the head, chest x-ray    PROCEDURES:   .Critical Care E&M  Performed by: Faythe Ghee, PA-C Critical care provider statement:    Critical care time (minutes):  30   Critical care time was exclusive of:  Separately billable procedures and treating other patients   Critical care was necessary to treat or prevent imminent or life-threatening deterioration of the following conditions:  Circulatory failure   Critical care was time spent personally by me on the following activities:  Blood draw for specimens, development of treatment plan with patient or surrogate, evaluation of patient's response to treatment, examination of patient, interpretation of cardiac output measurements, obtaining history from patient or surrogate, ordering and review of  laboratory studies, ordering and review of radiographic studies and review of old charts   Care discussed with: admitting provider   After initial E/M assessment, critical care services were subsequently performed that were exclusive of separately billable procedures or treatment.      MEDICATIONS ORDERED IN ED: Medications  ondansetron (ZOFRAN-ODT) disintegrating tablet 4 mg (4 mg Oral Not Given 05/03/22 1351)  ondansetron (ZOFRAN) injection 4 mg (4 mg Intravenous Given 05/03/22 1317)     IMPRESSION / MDM / ASSESSMENT AND PLAN / ED COURSE  I reviewed the triage vital signs and the nursing notes.                              Differential diagnosis includes, but is not limited to, GI bleed, syncope, MI, arrhythmia, seizure, CVA, subdural  Patient's presentation is most consistent with acute presentation with potential threat to life or bodily function.   EKG shows normal sinus rhythm, see physician read  Chest x-ray was independently  reviewed and interpreted by me as being negative for any acute abnormality  CT of the head independently reviewed and interpreted by me as being negative for any acute abnormality   CBC is concerning for anemia with a hemoglobin of 7.9, her hemoglobin 4 days ago was at 11, metabolic panel shows hyponatremia with sodium of 130, calcium is depleted at 7.7  Troponin is reassuring, type and screen is pending  Due to the GI bleed, sudden drop in her hemoglobin feel that patient needs to be admitted.  Consult to hospitalist  Dr Myriam Forehand to admit  FINAL CLINICAL IMPRESSION(S) / ED DIAGNOSES   Final diagnoses:  Acute upper GI bleed  Near syncope  Incontinence in female     Rx / DC Orders   ED Discharge Orders     None        Note:  This document was prepared using Dragon voice recognition software and may include unintentional dictation errors.    Faythe Ghee, PA-C 05/03/22 1701    [name], MD 05/04/22 [phone]

## 2022-05-03 NOTE — ED Notes (Signed)
Pt leaving for imaging.

## 2022-05-03 NOTE — ED Notes (Signed)
See triage note; pt reports hx of needing blood transfusions due to low iron issues; states this is first time noticing blood in BM's; states is dark red and a lot of it. Denies changes to urination. Pt calm, skin dry and resp reg/unlabored. Pt has lots of blankets as is cold.

## 2022-05-03 NOTE — ED Notes (Signed)
Pt is now reporting SOB - dg chest added on and repeat vitals obtained

## 2022-05-03 NOTE — ED Triage Notes (Signed)
Patient c/o near syncope while shopping in [employer]. Reported feeling cold sweats and nauseas. Denies falling to the floor. Patient reports she was holding on to the side of the grocery cart. C/o being incontinent of urine during episode.   A&O x4 during triage

## 2022-05-03 NOTE — ED Notes (Signed)
Room 133 assigned.

## 2022-05-03 NOTE — ED Notes (Signed)
Pt reports drinks 1.5 to 2 glasses of wine a day; last was last night.

## 2022-05-03 NOTE — ED Notes (Signed)
Pt back to room.

## 2022-05-03 NOTE — Consult Note (Signed)
Consultation  Referring Provider:   Hospitalist   Admit date: 05/03/2022 Consult date: 05/03/2022         Reason for Consultation:    Concern for GI bleeding         HPI:   Jennifer [name] is a 72 y.o. lady with history of sleeve gastrectomy and subsequent duodenal switch, neuropathy, lower extremity wound, and chronic anemia. She presents today with light headedness and complaining of dark red/black stool. She has had history of recurrent non-specific GI bleeding which has been evaluated with EGD/Colon/Flex sig/VCE with only hemorrhoids found. She states the bleeding happens with every bowel movement and has been going on for 4-5 days. Although she saw her PCP on the 12th and denied any bleeding at that time. She states she drinks 2 glasses of wine nightly and starts to "shake" if she goes long without alcohol. Her last drink was last night and she has already started "shaking" and sweating so I suspect she drinks more than she is telling. She also had elevated enzymes on labs 4 days ago and was not iron deficient but did have folate deficiency. She has chronic loose stools since her last bariatric surgery in 2018. No NSAIDS. She takes 2.6 grams of tylenol daily. No blood thinners. Denies any smoking or illicit drugs.  Past Medical History:  Diagnosis Date   Anemia    Anxiety    Bariatric surgery status    Complication of anesthesia    Diificulty breathing for about 15 minutes after bariatric surgery   Constipation    COVID-19    Dysrhythmia    Elevated liver enzymes    GERD (gastroesophageal reflux disease)    Hemorrhoids    Herpes genitalis    High cholesterol    Hyperlipidemia    Hypertension    Hypothyroidism    IDA (iron deficiency anemia) 02/09/2021   Neuropathy    Osteoarthritis    Sleep apnea    CPAP    Past Surgical History:  Procedure Laterality Date   ABDOMINAL HYSTERECTOMY     total for fibroids no h/o abnormal pap   bariatric sleeve  2015   BREAST EXCISIONAL  BIOPSY Left 1998   carpal tunnel repair     CATARACT EXTRACTION W/PHACO Right 11/01/2021   Procedure: CATARACT EXTRACTION PHACO AND INTRAOCULAR LENS PLACEMENT (IOC) RIGHT;  Surgeon: Nevada Crane, MD;  Location: [facility];  Service: Ophthalmology;  Laterality: Right;  sleep apnea 4.95 00:49.7   CATARACT EXTRACTION W/PHACO Left 11/15/2021   Procedure: CATARACT EXTRACTION PHACO AND INTRAOCULAR LENS PLACEMENT (IOC) LEFT 4.94 00:32.3;  Surgeon: Nevada Crane, MD;  Location: [facility];  Service: Ophthalmology;  Laterality: Left;  sleep apnea   COLONOSCOPY WITH PROPOFOL N/A 02/11/2016   Procedure: COLONOSCOPY WITH PROPOFOL;  Surgeon: Wyline Mood, MD;  Location: [facility];  Service: Endoscopy;  Laterality: N/A;   COLONOSCOPY WITH PROPOFOL N/A 10/01/2019   Procedure: COLONOSCOPY WITH PROPOFOL;  Surgeon: Wyline Mood, MD;  Location: [facility];  Service: Gastroenterology;  Laterality: N/A;   COLONOSCOPY WITH PROPOFOL N/A 11/19/2020   Procedure: COLONOSCOPY WITH PROPOFOL;  Surgeon: Wyline Mood, MD;  Location: [facility];  Service: Gastroenterology;  Laterality: N/A;   ESOPHAGOGASTRODUODENOSCOPY (EGD) WITH PROPOFOL N/A 12/02/2019   Procedure: ESOPHAGOGASTRODUODENOSCOPY (EGD) WITH PROPOFOL;  Surgeon: Wyline Mood, MD;  Location: [facility];  Service: Gastroenterology;  Laterality: N/A;   ESOPHAGOGASTRODUODENOSCOPY (EGD) WITH PROPOFOL N/A 11/19/2020   Procedure: ESOPHAGOGASTRODUODENOSCOPY (EGD) WITH PROPOFOL;  Surgeon: Wyline Mood, MD;  Location:  [facility] END[facility]COPY;  Service: Gastroenterology;  Laterality: N/A;   FLEXIBLE SIGMOIDOSCOPY N/A 02/06/2021   Procedure: FLEXIBLE SIGMOIDOSCOPY;  Surgeon: Jaynie Collins, DO;  Location: [facility] END[facility]COPY;  Service: Gastroenterology;  Laterality: N/A;   GIVENS CAPSULE STUDY N/A 06/07/2021   Procedure: GIVENS CAPSULE STUDY;  Surgeon: Toney Reil, MD;  Location: [facility]COPY;  Service: Gastroenterology;  Laterality:  N/A;   GIVENS CAPSULE STUDY N/A 06/09/2021   Procedure: GIVENS CAPSULE STUDY;  Surgeon: Toney Reil, MD;  Location: [facility] ENDOSCOPY;  Service: Gastroenterology;  Laterality: N/A;   HEMORRHOID SURGERY     LAPAROSCOPIC GASTRIC RESTRICTIVE DUODENAL PROCEDURE (DUODENAL SWITCH) Bilateral    2020    Family History  Problem Relation Age of Onset   Breast cancer Sister 61       materal 1/2 sister   Hypertension Sister    Hypertension Mother    Heart disease Father    Hypertension Brother      Social History   Tobacco Use   Smoking status: Former    Packs/day: 1.50    Years: 24.00    Additional pack years: 0.00    Total pack years: 36.00    Types: Cigarettes    Quit date: 1993    Years since quitting: 31.3   Smokeless tobacco: Never   Tobacco comments:    quit 1995.   Vaping Use   Vaping Use: Never used  Substance Use Topics   Alcohol use: Not Currently    Alcohol/week: 14.0 standard drinks of alcohol    Types: 14 Glasses of wine per week   Drug use: No    Prior to Admission medications   Medication Sig Start Date End Date Taking? Authorizing Provider  acetaminophen (TYLENOL) 325 MG tablet Take 2 tablets (650 mg total) by mouth every 6 (six) hours as needed for mild pain (or Fever >/= 101). 06/12/20   Lynn Ito, MD  acyclovir (ZOVIRAX) 400 MG tablet Take 1 tablet (400 mg total) by mouth daily. 04/29/22   Allegra Grana, FNP  calcium citrate (CALCITRATE - DOSED IN MG ELEMENTAL CALCIUM) 950 (200 Ca) MG tablet Take 200 mg of elemental calcium by mouth daily.    [provider]  fluticasone (FLONASE) 50 MCG/ACT nasal spray SPRAY 1 SPRAY INTO BOTH NOSTRILS DAILY. 01/12/22   Allegra Grana, FNP  hydrocortisone (ANUSOL-HC) 25 MG suppository Unwrap and insert 1 suppository rectally twice a day 04/29/22   Allegra Grana, FNP  levothyroxine (SYNTHROID) 50 MCG tablet TAKE 1 TABLET (50 MCG TOTAL) BY MOUTH DAILY. DX CODE E03.9 03/18/22   Allegra Grana, FNP   losartan (COZAAR) 100 MG tablet TAKE 1 TABLET BY MOUTH EVERYDAY AT BEDTIME 04/29/22   Allegra Grana, FNP  Multiple Vitamin (MULTIVITAMIN WITH MINERALS) TABS tablet Take 1 tablet by mouth daily.    [provider]  pantoprazole (PROTONIX) 40 MG tablet Take 1 tablet (40 mg total) by mouth 2 (two) times daily. 09/22/21   Wyline Mood, MD  pregabalin (LYRICA) 50 MG capsule Take 1 capsule (50 mg total) by mouth 3 (three) times daily. 04/29/22   Allegra Grana, FNP  sodium chloride (OCEAN) 0.65 % SOLN nasal spray Place 2 sprays into both nostrils as needed for congestion. 01/21/21   McLean-Scocuzza, Pasty Spillers, MD  traZODone (DESYREL) 50 MG tablet for sleep DX Code G47.00 04/29/22   Allegra Grana, FNP  triamterene-hydrochlorothiazide (DYAZIDE) 37.5-25 MG capsule TAKE 1 EACH (1 CAPSULE TOTAL) BY MOUTH DAILY. 03/17/22  Allegra Grana, FNP    Current Facility-Administered Medications  Medication Dose Route Frequency Provider Last Rate Last Admin   0.9 %  sodium chloride infusion   Intravenous Continuous Lurene Shadow, MD       iohexol (OMNIPAQUE) 350 MG/ML injection 100 mL  100 mL Intravenous Once PRN Lurene Shadow, MD       ondansetron (ZOFRAN-ODT) disintegrating tablet 4 mg  4 mg Oral Once Summers, Rhonda L, PA-C       pantoprazole (PROTONIX) 80 mg /NS 100 mL IVPB  80 mg Intravenous Once Lurene Shadow, MD       Melene Muller ON 05/07/2022] pantoprazole (PROTONIX) injection 40 mg  40 mg Intravenous Q12H Lurene Shadow, MD       pantoprozole (PROTONIX) 80 mg /NS 100 mL infusion  8 mg/hr Intravenous Continuous Lurene Shadow, MD       sodium chloride 0.9 % bolus 500 mL  500 mL Intravenous Once Lurene Shadow, MD       Current Outpatient Medications  Medication Sig Dispense Refill   acetaminophen (TYLENOL) 325 MG tablet Take 2 tablets (650 mg total) by mouth every 6 (six) hours as needed for mild pain (or Fever >/= 101).     acyclovir (ZOVIRAX) 400 MG tablet Take 1 tablet (400 mg total) by  mouth daily. 30 tablet 5   calcium citrate (CALCITRATE - DOSED IN MG ELEMENTAL CALCIUM) 950 (200 Ca) MG tablet Take 200 mg of elemental calcium by mouth daily.     fluticasone (FLONASE) 50 MCG/ACT nasal spray SPRAY 1 SPRAY INTO BOTH NOSTRILS DAILY. 16 mL 2   hydrocortisone (ANUSOL-HC) 25 MG suppository Unwrap and insert 1 suppository rectally twice a day 12 suppository 1   levothyroxine (SYNTHROID) 50 MCG tablet TAKE 1 TABLET (50 MCG TOTAL) BY MOUTH DAILY. DX CODE E03.9 90 tablet 1   losartan (COZAAR) 100 MG tablet TAKE 1 TABLET BY MOUTH EVERYDAY AT BEDTIME 90 tablet 3   Multiple Vitamin (MULTIVITAMIN WITH MINERALS) TABS tablet Take 1 tablet by mouth daily.     pantoprazole (PROTONIX) 40 MG tablet Take 1 tablet (40 mg total) by mouth 2 (two) times daily. 180 tablet 3   pregabalin (LYRICA) 50 MG capsule Take 1 capsule (50 mg total) by mouth 3 (three) times daily. 90 capsule 2   sodium chloride (OCEAN) 0.65 % SOLN nasal spray Place 2 sprays into both nostrils as needed for congestion. 30 mL 2   traZODone (DESYREL) 50 MG tablet for sleep DX Code G47.00 90 tablet 3   triamterene-hydrochlorothiazide (DYAZIDE) 37.5-25 MG capsule TAKE 1 EACH (1 CAPSULE TOTAL) BY MOUTH DAILY. 90 capsule 2    Allergies as of 05/03/2022 - Review Complete 05/03/2022  Allergen Reaction Noted   Bee pollen Itching 11/17/2021   Celecoxib Hives 07/25/2016   Hydrocodone Nausea Only 06/02/2020   Pollen extract Itching    Sodium ferric gluconate [ferrous gluconate]  11/20/2020   Nasacort [triamcinolone] Other (See Comments) 12/07/2015   Vicodin [hydrocodone-acetaminophen] Nausea Only 09/03/2014     Review of Systems:    All systems reviewed and negative except where noted in HPI.  Review of Systems  Constitutional:  Negative for chills and fever.  Respiratory:  Negative for shortness of breath.   Cardiovascular:  Negative for chest pain.  Gastrointestinal:  Positive for blood in stool, diarrhea, melena and nausea.  Negative for constipation.  Musculoskeletal:  Positive for joint pain.  Skin:  Negative for rash.  Neurological:  Positive for tingling and tremors.  Psychiatric/Behavioral:  Positive for substance abuse.   All other systems reviewed and are negative.     Physical Exam:  Vital signs in last 24 hours: Temp:  [98 F (36.7 C)-98.6 F (37 C)] 98.6 F (37 C) (04/16 1442) Pulse Rate:  [67-87] 72 (04/16 1709) Resp:  [16-22] 17 (04/16 1709) BP: (110-145)/(56-68) 145/65 (04/16 1700) SpO2:  [99 %-100 %] 100 % (04/16 1709) Weight:  [82.6 kg] 82.6 kg (04/16 1304)   General:   Tremulous Head:  Normocephalic and atraumatic. Eyes:   No icterus.   Mouth: Mucosa pink moist, no lesions. Neck:  Supple; no masses felt Lungs: No respiratory distress Abdomen:   Flat, soft, nondistended, nontender Msk:  No clubbing or cyanosiss. Neurologic:  Alert and  oriented x4;  No focal deficits Skin:  Warm, dry, pink without significant lesions or rashes. Psych:  Alert and cooperative. Normal affect.  LAB RESULTS: Recent Labs    05/03/22 1312  WBC 6.1  HGB 7.9*  HCT 24.1*  PLT 133*   BMET Recent Labs    05/03/22 1312  NA 130*  K 4.6  CL 99  CO2 18*  GLUCOSE 130*  BUN 11  CREATININE 0.63  CALCIUM 7.7*   LFT No results for input(s): "PROT", "ALBUMIN", "AST", "ALT", "ALKPHOS", "BILITOT", "BILIDIR", "IBILI" in the last 72 hours. PT/INR No results for input(s): "LABPROT", "INR" in the last 72 hours.  STUDIES: DG Chest 2 View  Result Date: 05/03/2022 CLINICAL DATA:  Shortness of breath EXAM: CHEST - 2 VIEW COMPARISON:  11/17/2021 FINDINGS: The heart size and mediastinal contours are within normal limits. Both lungs are clear. The visualized skeletal structures are unremarkable. A thin linear radiodensity projects over the left shoulder on the frontal view and is presumably external to the patient. IMPRESSION: No active cardiopulmonary disease. Electronically Signed   By: Duanne Guess D.O.    On: 05/03/2022 15:22   CT Head Wo Contrast  Result Date: 05/03/2022 CLINICAL DATA:  Vertigo EXAM: CT HEAD WITHOUT CONTRAST TECHNIQUE: Contiguous axial images were obtained from the base of the skull through the vertex without intravenous contrast. RADIATION DOSE REDUCTION: This exam was performed according to the departmental dose-optimization program which includes automated exposure control, adjustment of the mA and/or kV according to patient size and/or use of iterative reconstruction technique. COMPARISON:  CT head 09/27/21 FINDINGS: Brain: No evidence of acute infarction, hemorrhage, hydrocephalus, extra-axial collection or mass lesion/mass effect. Vascular: No hyperdense vessel or unexpected calcification. Skull: Normal. Negative for fracture or focal lesion. Sinuses/Orbits: No middle ear or mastoid effusion. Paranasal sinuses are clear. Bilateral lens replacement. Orbits otherwise unremarkable. Other: None. IMPRESSION: No acute intracranial abnormality. Electronically Signed   By: Lorenza Cambridge M.D.   On: 05/03/2022 14:33       Impression / Plan:   72 y/o lady with history of alcohol use who becomes tremulous without regular use, bariatric surgery, and hypothyroidism who presents with light-headedness with complaining of dark red/black stool whenever she has a bowel movement. Given presentation, I'm more concerned about alcohol withdrawal and potential alcoholic hepatitis. She does not have elevated BUN/Cr ratio  - I ordered hepatic function panel and INR, will try to add on but if not able to, will need fresh stick - needs IV thiamine, multivitamin, IV fluids - CIWA - daily CMP and INR - clear liquid diet - recommend NPO at midnight but will not schedule any procedures until evaluated in the morning - IV PPI BID - maintain active type and screen -  two large bore IV's - trend CBC, transfuse for hemoglobin < 7  Please call with any questions or concerns.  Merlyn Lot MD,  [facility]

## 2022-05-03 NOTE — ED Provider Triage Note (Signed)
Emergency Medicine Provider Triage Evaluation Note  Jennifer [name] , a 72 y.o. female  was evaluated in triage.  Pt complains of dizziness that occurred while she was shopping at [employer].  Patient arrives by EMS and continues to complain of some nausea.  Patient denies any chest pain, headache, visual changes.  Review of Systems  Positive: Positive nausea, dizziness. Negative: No shortness of breath, chest pain, difficulty breathing.  Physical Exam  Ht  (1.549 m)   Wt 82.6 kg   BMI 34.39 kg/m  Gen:   Awake, no distress, talkative. Resp:  Normal effort, lungs are clear. MSK:   Moves extremities without difficulty  Other:  Cranial nerves II through XII grossly intact.  Speech is normal.  Answering questions appropriately.  Grip strength equal bilaterally.  Medical Decision Making  Medically screening exam initiated at 1:11 PM.  Appropriate orders placed.  Jennifer [name] was informed that the remainder of the evaluation will be completed by another provider, this initial triage assessment does not replace that evaluation, and the importance of remaining in the ED until their evaluation is complete.     Jennifer Rumps, PA-C 05/03/22 1314

## 2022-05-04 ENCOUNTER — Ambulatory Visit: Payer: Medicare Other

## 2022-05-04 ENCOUNTER — Encounter: Payer: Self-pay | Admitting: Family

## 2022-05-04 DIAGNOSIS — Z9884 Bariatric surgery status: Secondary | ICD-10-CM | POA: Diagnosis not present

## 2022-05-04 DIAGNOSIS — E669 Obesity, unspecified: Secondary | ICD-10-CM | POA: Diagnosis present

## 2022-05-04 DIAGNOSIS — K760 Fatty (change of) liver, not elsewhere classified: Secondary | ICD-10-CM | POA: Diagnosis present

## 2022-05-04 DIAGNOSIS — F1010 Alcohol abuse, uncomplicated: Secondary | ICD-10-CM | POA: Diagnosis present

## 2022-05-04 DIAGNOSIS — R7401 Elevation of levels of liver transaminase levels: Secondary | ICD-10-CM | POA: Diagnosis not present

## 2022-05-04 DIAGNOSIS — D509 Iron deficiency anemia, unspecified: Secondary | ICD-10-CM | POA: Diagnosis present

## 2022-05-04 DIAGNOSIS — Q438 Other specified congenital malformations of intestine: Secondary | ICD-10-CM | POA: Diagnosis not present

## 2022-05-04 DIAGNOSIS — E7800 Pure hypercholesterolemia, unspecified: Secondary | ICD-10-CM | POA: Diagnosis present

## 2022-05-04 DIAGNOSIS — K314 Gastric diverticulum: Secondary | ICD-10-CM | POA: Diagnosis not present

## 2022-05-04 DIAGNOSIS — G629 Polyneuropathy, unspecified: Secondary | ICD-10-CM | POA: Diagnosis present

## 2022-05-04 DIAGNOSIS — Z87891 Personal history of nicotine dependence: Secondary | ICD-10-CM | POA: Diagnosis not present

## 2022-05-04 DIAGNOSIS — R55 Syncope and collapse: Secondary | ICD-10-CM | POA: Diagnosis present

## 2022-05-04 DIAGNOSIS — E871 Hypo-osmolality and hyponatremia: Secondary | ICD-10-CM | POA: Diagnosis present

## 2022-05-04 DIAGNOSIS — K922 Gastrointestinal hemorrhage, unspecified: Secondary | ICD-10-CM | POA: Diagnosis not present

## 2022-05-04 DIAGNOSIS — F419 Anxiety disorder, unspecified: Secondary | ICD-10-CM | POA: Diagnosis present

## 2022-05-04 DIAGNOSIS — G4733 Obstructive sleep apnea (adult) (pediatric): Secondary | ICD-10-CM | POA: Diagnosis present

## 2022-05-04 DIAGNOSIS — D62 Acute posthemorrhagic anemia: Secondary | ICD-10-CM

## 2022-05-04 DIAGNOSIS — F1090 Alcohol use, unspecified, uncomplicated: Secondary | ICD-10-CM | POA: Diagnosis not present

## 2022-05-04 DIAGNOSIS — Z6834 Body mass index (BMI) 34.0-34.9, adult: Secondary | ICD-10-CM | POA: Diagnosis not present

## 2022-05-04 DIAGNOSIS — Z8249 Family history of ischemic heart disease and other diseases of the circulatory system: Secondary | ICD-10-CM | POA: Diagnosis not present

## 2022-05-04 DIAGNOSIS — K641 Second degree hemorrhoids: Secondary | ICD-10-CM | POA: Diagnosis not present

## 2022-05-04 DIAGNOSIS — I10 Essential (primary) hypertension: Secondary | ICD-10-CM | POA: Diagnosis not present

## 2022-05-04 DIAGNOSIS — D649 Anemia, unspecified: Secondary | ICD-10-CM | POA: Diagnosis not present

## 2022-05-04 DIAGNOSIS — L89613 Pressure ulcer of right heel, stage 3: Secondary | ICD-10-CM | POA: Diagnosis present

## 2022-05-04 DIAGNOSIS — K921 Melena: Secondary | ICD-10-CM | POA: Diagnosis not present

## 2022-05-04 DIAGNOSIS — K649 Unspecified hemorrhoids: Secondary | ICD-10-CM | POA: Diagnosis not present

## 2022-05-04 DIAGNOSIS — E876 Hypokalemia: Secondary | ICD-10-CM | POA: Diagnosis not present

## 2022-05-04 DIAGNOSIS — Z8616 Personal history of COVID-19: Secondary | ICD-10-CM | POA: Diagnosis not present

## 2022-05-04 DIAGNOSIS — E039 Hypothyroidism, unspecified: Secondary | ICD-10-CM | POA: Diagnosis present

## 2022-05-04 LAB — TYPE AND SCREEN: ABO/RH(D): O POS

## 2022-05-04 LAB — BASIC METABOLIC PANEL
Anion gap: 7 (ref 5–15)
BUN: 12 mg/dL (ref 8–23)
CO2: 25 mmol/L (ref 22–32)
Calcium: 8.1 mg/dL — ABNORMAL LOW (ref 8.9–10.3)
Chloride: 106 mmol/L (ref 98–111)
Creatinine, Ser: 0.74 mg/dL (ref 0.44–1.00)
GFR, Estimated: >60 mL/min (ref >60)
Glucose, Bld: 99 mg/dL (ref 70–99)
Potassium: 4 mmol/L (ref 3.5–5.1)
Sodium: 138 mmol/L (ref 135–145)

## 2022-05-04 LAB — CBC
HCT: 26.1 % — ABNORMAL LOW (ref 36.0–46.0)
Hemoglobin: 8.8 g/dL — ABNORMAL LOW (ref 12.0–15.0)
MCH: 29.4 pg (ref 26.0–34.0)
MCHC: 33.7 g/dL (ref 30.0–36.0)
MCV: 87.3 fL (ref 80.0–100.0)
Platelets: 116 10*3/uL — ABNORMAL LOW (ref 150–400)
RBC: 2.99 MIL/uL — ABNORMAL LOW (ref 3.87–5.11)
RDW: 13.4 % (ref 11.5–15.5)
WBC: 5.3 10*3/uL (ref 4.0–10.5)
nRBC: 0 % (ref 0.0–0.2)

## 2022-05-04 LAB — BPAM RBC
Blood Product Expiration Date: 202405222359
ISSUE DATE / TIME: 202404162325
Unit Type and Rh: 5100

## 2022-05-04 LAB — HEMOGLOBIN AND HEMATOCRIT, BLOOD
HCT: 27.9 % — ABNORMAL LOW (ref 36.0–46.0)
HCT: 27.3 % — ABNORMAL LOW (ref 36.0–46.0)
Hemoglobin: 9.3 g/dL — ABNORMAL LOW (ref 12.0–15.0)
Hemoglobin: 9.3 g/dL — ABNORMAL LOW (ref 12.0–15.0)

## 2022-05-04 MED ORDER — SALINE SPRAY 0.65 % NA SOLN: 2.0000 | NASAL | Status: DC | PRN

## 2022-05-04 MED ORDER — ACETAMINOPHEN 325 MG PO TABS
650.0000 mg | ORAL_TABLET | Freq: Four times a day (QID) | ORAL | Status: DC | PRN
  Administered 2022-05-04: 650 mg via ORAL
  Filled 2022-05-04: qty 2

## 2022-05-04 MED ORDER — PANTOPRAZOLE SODIUM 40 MG IV SOLR
40.0000 mg | Freq: Two times a day (BID) | INTRAVENOUS | Status: DC
  Administered 2022-05-04 – 2022-05-05 (×2): 40 mg via INTRAVENOUS
  Filled 2022-05-04 (×2): qty 10

## 2022-05-04 MED ORDER — GABAPENTIN 300 MG PO CAPS
900.0000 mg | ORAL_CAPSULE | Freq: Two times a day (BID) | ORAL | Status: DC
  Administered 2022-05-04 – 2022-05-05 (×3): 900 mg via ORAL
  Filled 2022-05-04 (×3): qty 3

## 2022-05-04 MED ORDER — PANTOPRAZOLE SODIUM 40 MG IV SOLR: 40.0000 mg | Freq: Two times a day (BID) | INTRAVENOUS | Status: DC

## 2022-05-04 MED ORDER — FLUTICASONE PROPIONATE 50 MCG/ACT NA SUSP: 1.0000 | Freq: Every day | NASAL | Status: DC | PRN

## 2022-05-04 MED ORDER — PEG 3350-KCL-NA BICARB-NACL 420 G PO SOLR
4000.0000 mL | Freq: Once | ORAL | Status: AC
  Administered 2022-05-04: 4000 mL via ORAL
  Filled 2022-05-04: qty 4000

## 2022-05-04 MED ORDER — ACYCLOVIR 200 MG PO CAPS
400.0000 mg | ORAL_CAPSULE | Freq: Every day | ORAL | Status: DC
  Administered 2022-05-04 – 2022-05-05 (×2): 400 mg via ORAL
  Filled 2022-05-04 (×2): qty 2

## 2022-05-04 MED ORDER — GABAPENTIN 300 MG PO CAPS
ORAL_CAPSULE | ORAL | 2 refills | Status: DC
Start: 2022-05-04 — End: 2022-05-05

## 2022-05-04 NOTE — Progress Notes (Signed)
GI Inpatient Follow-up Note  Subjective:  Patient seen and doing well. States she had a maroon stool. No tremors currently.  Scheduled Inpatient Medications:   levothyroxine  50 mcg Oral Q0600   ondansetron  4 mg Oral Once   [START ON 05/07/2022] pantoprazole  40 mg Intravenous Q12H   pregabalin  50 mg Oral TID   thiamine  100 mg Intravenous Daily    Continuous Inpatient Infusions:    sodium chloride 100 mL/hr at 05/04/22 0249   pantoprazole 8 mg/hr (05/04/22 0307)    PRN Inpatient Medications:  acetaminophen **OR** acetaminophen, LORazepam **OR** LORazepam, ondansetron **OR** ondansetron (ZOFRAN) IV, traZODone  Review of Systems:  Review of Systems  Constitutional:  Negative for chills and fever.  Respiratory:  Negative for cough.   Cardiovascular:  Negative for chest pain.  Gastrointestinal:  Positive for blood in stool and heartburn.  Musculoskeletal:  Negative for joint pain.  Skin:  Negative for rash.  Neurological:  Negative for focal weakness.  Psychiatric/Behavioral:  Positive for substance abuse.   All other systems reviewed and are negative.     Physical Examination: BP (!) 127/55 (BP Location: Right Arm)   Pulse 69   Temp 98.5 F (36.9 C)   Resp 16   Ht  (1.549 m)   Wt 81.9 kg   SpO2 99%   BMI 34.12 kg/m  Gen: NAD, alert and oriented x 4 HEENT: PEERLA, EOMI, Neck: supple, no JVD or thyromegaly Chest: No respiratory distress Abd: soft, non-tender, non-distended Ext: no edema, well perfused Skin: no rash or lesions noted Lymph: no lymphadenopathy  Data: Lab Results  Component Value Date   WBC 5.3 05/04/2022   HGB 8.8 (L) 05/04/2022   HCT 26.1 (L) 05/04/2022   MCV 87.3 05/04/2022   PLT 116 (L) 05/04/2022   Recent Labs  Lab 05/03/22 1312 05/03/22 2032 05/04/22 0401  HGB 7.9* 7.8* 8.8*   Lab Results  Component Value Date   NA 138 05/04/2022   K 4.0 05/04/2022   CL 106 05/04/2022   CO2 25 05/04/2022   BUN 12 05/04/2022    CREATININE 0.74 05/04/2022   Lab Results  Component Value Date   ALT 59 (H) 05/03/2022   AST 300 (H) 05/03/2022   ALKPHOS 175 (H) 05/03/2022   BILITOT 1.3 (H) 05/03/2022   Recent Labs  Lab 05/03/22 2032  INR 1.3*   Assessment/Plan: Jennifer [name] is a 72 y.o. lady with history of duodenal switch, hypothyroidism, and alcohol use here with maroon stool. Also with elevated liver enzymes and INR. Suspect mild alcoholic hepatitis  Recommendations:  - will plan on EGD/Colonoscopy tomorrow - needs IV thiamine, multivitamin, IV fluids - CIWA - daily CMP and INR - clear liquid diet - recommend NPO at midnight - IV PPI BID - maintain active type and screen - two large bore IV's - trend CBC, transfuse for hemoglobin < 7   Please call with any questions or concerns.  Merlyn Lot MD, MPH [facility]

## 2022-05-04 NOTE — Plan of Care (Signed)
  Problem: Clinical Measurements: Goal: Will remain free from infection Outcome: Progressing   Problem: Clinical Measurements: Goal: Diagnostic test results will improve Outcome: Progressing   Problem: Elimination: Goal: Will not experience complications related to bowel motility Outcome: Progressing Goal: Will not experience complications related to urinary retention Outcome: Progressing

## 2022-05-04 NOTE — TOC Initial Note (Signed)
Transition of Care [facility]) - Initial/Assessment Note    Patient Details  Name: Jennifer [name] MRN: [id number] Date of Birth: [birth year]  Transition of Care [facility]) CM/SW Contact:    Garret Reddish, RN Phone Number: 05/04/2022, 12:03 PM  Clinical Narrative:     Chart reviewed.  Noted that patient was admitted with Acute on Chronic Anemia.  Noted that patient had some black stool melena.  He is currently on IV Protonix.  CT angio showed no active GI Bleed but liver steatosis.  GI consulted on patient.  Noted that patient scheduled for EGD/Colonoscopy on tomorrow.    I have meet with patient at bedside today.  She reports that prior to admission she lived at home with her husband.  She was able to bath and dress herself.  She was able to get around with a single point cane.  Patient reports that she has a CPAP and crutches at home for DME.  Patient reports that medications are affordable.  [facility].  PCP is [name].    Patient informs me that she would like some resources to assist with her drinking.  She report that she drinks about 3-4 large styrofoam cups of wine daily.  She reports that has increased gradually over time and would like some resources to assist her with drinking.  I have informed her that I will place Substance abuse reports on patient discharge instructions.  I have placed Substance abuse resources on patient instructions.    Patient does go to Outpatient wound care clinic for stage 3 ulcer on her right lateral foot.  Patient reports on discharge that he husband will transport her home.    TOC will continue to follow for discharge planning.                Expected Discharge Plan:  (Pending Medical Work up) Barriers to Discharge: No Barriers Identified   Patient Goals and CMS Choice            Expected Discharge Plan and Services   Discharge Planning Services: CM Consult   Living arrangements for the past 2 months: Single Family Home                  DME Arranged:  (Patient has a CPAP and a single point cane at home.)                    Prior Living Arrangements/Services Living arrangements for the past 2 months: Single Family Home Lives with:: Spouse Patient language and need for interpreter reviewed:: Yes Do you feel safe going back to the place where you live?: Yes      Need for Family Participation in Patient Care: Yes (Comment) Care giver support system in place?: Yes (comment) (Patient has supportive husband)      Activities of Daily Living Home Assistive Devices/Equipment: Cane (specify quad or straight) ADL Screening (condition at time of admission) Patient's cognitive ability adequate to safely complete daily activities?: Yes Is the patient deaf or have difficulty hearing?: No Does the patient have difficulty seeing, even when wearing glasses/contacts?: No Does the patient have difficulty concentrating, remembering, or making decisions?: No Patient able to express need for assistance with ADLs?: Yes Does the patient have difficulty dressing or bathing?: No Independently performs ADLs?: Yes (appropriate for developmental age) Does the patient have difficulty walking or climbing stairs?: No Weakness of Legs: Both Weakness of Arms/Hands: None  Permission Sought/Granted   Permission  granted to share information with : Yes, Verbal Permission Granted              Emotional Assessment Appearance:: Appears stated age Attitude/Demeanor/Rapport: Engaged Affect (typically observed): Pleasant Orientation: : Oriented to Self, Oriented to Place, Oriented to  Time, Oriented to Situation Alcohol / Substance Use: Alcohol Use (Would like to receive information about Therapy for her alcohol abuse.  She reports that she drinks about 3-4 large styrofoam cups of wine daily.  She reports that she has been doing this for about 2 years.  Has progressly gotten worse.)    Admission diagnosis:  Acute upper GI bleed  [K92.2] Near syncope [R55] Incontinence in female [R32] Acute on chronic anemia [D64.9] Patient Active Problem List   Diagnosis Date Noted   Acute on chronic anemia 05/03/2022   Melena 05/03/2022   Alcohol use disorder 05/03/2022   Transaminitis 11/22/2021   Tinea pedis 08/31/2021   Upper GI bleed 06/06/2021   Eye discharge 02/16/2021   IDA (iron deficiency anemia) 02/09/2021   Acute blood loss anemia 02/06/2021   Elevated liver function tests    Bright red blood per rectum 02/05/2021   Open wound of left heel 01/20/2021   Anxiety and depression 11/25/2020   CPAP (continuous positive airway pressure) dependence 11/25/2020   Sinusitis 11/25/2020   Allergic conjunctivitis of both eyes and rhinitis 11/25/2020   GI bleed 11/17/2020   Chronic iron deficiency anemia    Symptomatic anemia 11/11/2020   Postop check 08/07/2020   Biliary sludge determined by ultrasound 07/02/2020   Right upper quadrant abdominal pain 06/10/2020   Low back pain 05/18/2020   Muscle spasm 05/18/2020   Bilateral tinnitus 04/30/2020   Irregular heartbeat 02/25/2020   Diabetes mellitus without complication 02/25/2020   Strain of right knee 02/10/2020   Aspirin long-term use 10/22/2019   Wound ballistics 08/29/2019   Displacement of lumbar intervertebral disc without myelopathy 08/29/2019   Helicobacter pylori gastrointestinal tract infection 08/29/2019   Hypercholesterolemia 08/29/2019   Phlebitis after infusion 06/26/2019   Superficial thrombophlebitis of left upper extremity 06/26/2019   BMI 38.0-38.9,adult 06/19/2019   Small bowel obstruction 05/25/2019   Morbid obesity 05/16/2019   Cervical pain (neck) 04/09/2019   Pedal edema 03/06/2019   Pre-operative clearance 03/06/2019   History of sleeve gastrectomy 01/23/2019   Chronic pain syndrome 10/03/2018   Obstructive sleep apnea 10/03/2018   Right shoulder pain 06/08/2018   Chronic venous insufficiency 05/24/2018   Lymphedema 05/24/2018   Lumbar  spondylosis 05/17/2018   Leg swelling 05/09/2018   Neuropathy 01/24/2018   Insomnia 07/12/2017   Fracture of phalanx of finger 05/31/2017   Impingement syndrome of shoulder region 05/31/2017   Hemorrhoids 12/15/2016   Hypothyroidism 10/03/2016   Chronic shoulder bursitis 08/30/2016   Positive ANA (antinuclear antibody) 08/30/2016   Bradycardia 06/16/2016   History of adenomatous polyp of colon 05/02/2016   Elevated liver enzymes 04/06/2016   OSA (obstructive sleep apnea) 04/06/2016   Bilateral shoulder pain 01/13/2016   Fatty liver 12/08/2015   Arthritis 12/08/2015   History of bariatric surgery 12/07/2015   Genital herpes 11/11/2015   Hyperlipidemia 11/11/2015   Essential hypertension 11/11/2015   Routine physical examination 11/11/2015   Allergic rhinitis 11/11/2015   GERD (gastroesophageal reflux disease) 11/11/2015   Herpesviral infection, unspecified 01/08/2014   Disorder of thyroid, unspecified 01/08/2014   Lumbar radiculitis 11/19/2013   Neuritis or radiculitis due to rupture of lumbar intervertebral disc 11/19/2013   Monilial vaginitis 08/20/2013   Gastritis and duodenitis  07/25/2013   Impaired fasting glucose 07/17/2012   Obesity, unspecified 07/17/2012   Obesity 07/17/2012   Depression 03/15/2012   Abnormal electrocardiogram (ECG) (EKG) 03/06/2012   URI (upper respiratory infection) 01/16/2012   PCP:  Allegra Grana, FNP Pharmacy:   [facility] [address] [place] - [address] [place] [address] Phone: [phone] Fax: [phone]     Social Determinants of Health (SDOH) Social History: SDOH Screenings   Food Insecurity: No Food Insecurity (05/04/2022)  Housing: Low Risk  (05/04/2022)  Transportation Needs: No Transportation Needs (05/04/2022)  Utilities: Not At Risk (05/04/2022)  Depression (PHQ2-9): Low Risk  (04/29/2022)  Financial Resource Strain: Low Risk  (09/28/2021)  Physical Activity: Insufficiently Active  (09/06/2019)  Social Connections: Unknown (09/28/2021)  Stress: No Stress Concern Present (09/28/2021)  Tobacco Use: Medium Risk (05/03/2022)   SDOH Interventions:     Readmission Risk Interventions     No data to display

## 2022-05-04 NOTE — Consult Note (Signed)
WOC Nurse Consult Note: Reason for Consult:Right heel stage 3 ulceration and healing full thickness right lateral foot wound. Patient is seen weekly at the outpatient wound care center and was last seen on 04/29/22 by provider Job Founds, III.  I will continue the POC from that setting with a substitution for [facility] Ready, which is a non formulary item for silver hydrofiber (Aquacel Ag+ Advantage). The patient has also been evaluated by Vascular Surgery and her ABI illustrated non compressible vessels. The patient is not known to have diabetes.Please see notes from those encounters. Wound type:Neuropathic Pressure Injury POA: Yes Measurement:Right posterior calcaneus per 4/12 clinic visit measured 0.9cm x 0.6cm x 0.3cm Wound bed:red, moist Dressing procedure/placement/frequency:I have provided Nursing with guidance in the care of the above lesions using silver hydrofiber applied after cleansing and topped with an ABD pad and secured with a few turns of Kerlix roll gause/paper tape. The foot is to be placed into a Prevalon boot. A sacral silicone foam is to be placed for PI prevention.  WOC nursing team will not follow, but will remain available to this patient, the nursing and medical teams.  Please re-consult if needed.  Thank you for inviting [name] to participate in this patient's Plan of Care.  Ladona Mow, MSN, RN, CNS, GNP, Leda Min, [employer], [employer] phone:  [phone]

## 2022-05-04 NOTE — Telephone Encounter (Signed)
Call pt She is in the hospital Ensure she has seen mychart message

## 2022-05-04 NOTE — Telephone Encounter (Signed)
LVM for pt to call back.

## 2022-05-04 NOTE — Discharge Instructions (Signed)
                  Intensive Outpatient Programs  [facility]    [facility] [address]     [address] [place]     [place], [place] [phone]      [phone]  [facility]   [facility]  (Inpatient and outpatient)  [phone] (Suboxone and Methadone) [address]           [phone]           [facility]: [facility]    [facility] - Intensive Outpatient [address]     [address] [place], [place] [address]     [place], [place]  [phone]      [phone]  [facility] (Outpatient, Inpatient, Chemical  [facility] (Groups and [facility]) (insurance only) [phone]    [place], [place]          [phone]       [facility]    [facility] (for caregivers and family) [address]     [address] [place], [place]     [place], [place] [phone]      [phone]  Residential Treatment Programs  [facility]  [facility](2 years) Residential: 90 days)  [facility] [address]      [address] [place], [place]     [place], [place] [phone]      [phone] or [phone]  [facility]    [facility] [address]      [address] [place], [place]     , [place] [phone]      [phone]  [facility]   [facility] ([facility]) [address]     [address] [place], [place] [address]     [place], [place] [phone]      [phone] Admissions: 8am-3pm M-F  [facility]: Residential Program (90 Days)              [facility]: [facility]  [place], [place]     [place], [place]  [phone] or [phone]    (Walk in Hours over the weekend or by referral)   [facility]: [facility]:[phone] (for crisis  response 24 hours a day) 

## 2022-05-04 NOTE — Telephone Encounter (Signed)
Patient states she is returning our call.  I read [name], FNP's message to patient.  Patient would like to thank Claris Che.  Patient states that they are giving her Lyrica while in the hospital, but the dose is 50 MG per pill and they gave it to her this morning.  Patient states she thinks she is supposed to have this three times per day.  Patient states she is experiencing shooting pains/pins and needles in the bottom of her feet and it wakes her up off and on.  Patient states she did let her healthcare worker know in the hospital and they reached out to [name], FNP, regarding the gabapentin.  Patient states she is used to the gabapentin and would like to have more of it if possible.

## 2022-05-04 NOTE — Progress Notes (Signed)
PROGRESS NOTE    Jennifer [name]  ZOX:[id number] DOB: [birth year] DOA: 05/03/2022 PCP: Allegra Grana, FNP    Brief Narrative:  Jennifer [name] is a 72 y.o. female with past medical history of iron deficiency anemia, hemorrhoids, hypertension, hyperlipidemia, peripheral neuropathy, sleep apnea, s/p bariatric surgery presented to hospital with near syncope at the grocery store.  This was associated with dizziness, diaphoresis, generalized weakness, urinary incontinence but without loss of consciousness.  Patient was having dark stools with right upper quadrant pain for 4 days prior to this presentation.  She does have a history of iron deficiency anemia and was on IV iron in the past.  In the ED initial vitals were stable.  Labs showed anemia with hemoglobin of 7.9 with sodium low at 130.  Urinalysis was negative for infection.  INR was 1.3.  AST and ALT were elevated at 300, 59 respectively with total bilirubin of 1.3.  Assessment and plan.  Black stool/melena:  On IV Protonix.  Received IV fluids.  Patient underwent CT angio GI bleed with no active GI hemorrhage but liver steatosis.  GI on board.  Continue to hold losartan/HCTZ.  History of gastritis duodenitis GERD and hemorrhoids.  Had 1 episode of black stools this morning.  Will follow GI recommendation.     Acute blood loss anemia, history of chronic iron deficiency anemia:  Hemoglobin is inpatient at 7.9.  Hemoglobin was 11.2 on 04/29/2022.  Patient received 1 unit of packed RBC.  Hemoglobin today at 8.8 after transfusion.  Last bowel movement this morning with melanotic stools.    Near syncope, dizziness and chest pressure: Troponins negative.  EKG unremarkable.  Likely secondary to anemia.  Denies current chest discomfort.   Right upper quadrant pain/lump, elevated LFTs: CT abdomen pelvis without acute findings.  Hepatic steatosis noted.    Alcohol use disorder: Continue Ativan as per CIWA protocol.  No overt signs of withdrawal  at this time.  Drinks 1 to 2 glasses of alcohol every day.  Will get TOC assistance for alcohol detox resources.   Hyponatremia: Received IV fluids.  Likely secondary to alcohol use disorder.  Will continue to monitor sodium levels.   Essential hypertension,  Continue to hold losartan and Dyazide from home.  OSA on CPAP,   s/p bariatric surgery, supportive care.  hypothyroidism.  Continue Synthroid.      DVT prophylaxis: SCDs Start: 05/03/22 1952   Code Status:     Code Status: Full Code  Disposition: Home likely in 2 to 3 days  Status is: Observation  The patient will require care spanning > 2 midnights and should be moved to inpatient because: GI bleed, CIWA protocol   Family Communication: None at bedside.  Consultants:  GI  Procedures:  None  Antimicrobials:  None  Anti-infectives (From admission, onward)    None      Subjective: Today, patient was seen and examined at bedside.  States that she did have 1 dark maroon-colored stool this morning around 3 AM.  Denies any dizziness lightheadedness diaphoresis or obvious tremors.  Drinks alcohol every day and is scared about withdrawal.  Wishes to quit drinking.  Objective: Vitals:   05/03/22 2345 05/04/22 0247 05/04/22 0540 05/04/22 0832  BP: (!) 102/50 (!) 100/53 (!) 111/51 (!) 127/55  Pulse: 90 80  69  Resp: 18 18  16   Temp: 99.4 F (37.4 C) 99.3 F (37.4 C)  98.5 F (36.9 C)  TempSrc: Oral Oral    SpO2: 95% 95%  99%  Weight:      Height:        Intake/Output Summary (Last 24 hours) at 05/04/2022 0932 Last data filed at 05/04/2022 0603 Gross per 24 hour  Intake 1061.91 ml  Output --  Net 1061.91 ml   Filed Weights   05/03/22 1304 05/03/22 1949  Weight: 82.6 kg 81.9 kg    Physical Examination: Body mass index is 34.12 kg/m.  General: Obese built, not in obvious distress HENT:   Mild pallor noted oral mucosa is moist.  Chest:  Clear breath sounds.  Diminished breath sounds bilaterally. No  crackles or wheezes.  CVS: S1 &S2 heard. No murmur.  Regular rate and rhythm. Abdomen: Soft, nontender, mild tenderness over the right upper quadrant.  Bowel sounds are heard.   Extremities: No cyanosis, clubbing or edema.  Peripheral pulses are palpable. Psych: Alert, awake and oriented, normal mood CNS:  No cranial nerve deficits.  Power equal in all extremities.   Skin: Warm and dry.  No rashes noted.  Data Reviewed:   CBC: Recent Labs  Lab 04/29/22 0848 05/03/22 1312 05/03/22 2032 05/04/22 0401  WBC 5.6 6.1  --  5.3  NEUTROABS 3.1  --   --   --   HGB 11.2* 7.9* 7.8* 8.8*  HCT 34.2* 24.1* 23.1* 26.1*  MCV 94.2 91.3  --  87.3  PLT 176.0 133*  --  116*    Basic Metabolic Panel: Recent Labs  Lab 04/29/22 0848 05/03/22 1312 05/04/22 0401  NA 133* 130* 138  K 4.3 4.6 4.0  CL 97 99 106  CO2 25 18* 25  GLUCOSE 93 130* 99  BUN CREATININE 0.86 0.63 0.74  CALCIUM 8.9 7.7* 8.1*    Liver Function Tests: Recent Labs  Lab 04/29/22 0848 05/03/22 2032  AST 343* 300*  ALT 69* 59*  ALKPHOS 243* 175*  BILITOT 0.9 1.3*  PROT 6.7 6.0*  ALBUMIN 4.0 3.2*     Radiology Studies: CT ANGIO GI BLEED  Result Date: 05/03/2022 CLINICAL DATA:  Melena, anemia, presyncope EXAM: CTA ABDOMEN AND PELVIS WITHOUT AND WITH CONTRAST TECHNIQUE: Multidetector CT imaging of the abdomen and pelvis was performed using the standard protocol during bolus administration of intravenous contrast. Multiplanar reconstructed images and MIPs were obtained and reviewed to evaluate the vascular anatomy. RADIATION DOSE REDUCTION: This exam was performed according to the departmental dose-optimization program which includes automated exposure control, adjustment of the mA and/or kV according to patient size and/or use of iterative reconstruction technique. CONTRAST:  OMNIPAQUE IOHEXOL 350 MG/ML SOLN COMPARISON:  06/06/2021 FINDINGS: VASCULAR Aorta: Moderate atherosclerotic calcification. No  hemodynamically significant stenosis. No aneurysm or dissection. No periaortic inflammatory change or fluid collection. Celiac: Patent without evidence of aneurysm, dissection, vasculitis or significant stenosis. SMA: Patent without evidence of aneurysm, dissection, vasculitis or significant stenosis. Renals: Single right and dual left renal arteries are widely patent and demonstrate normal vascular morphology. No aneurysm or dissection. IMA: Patent without evidence of aneurysm, dissection, vasculitis or significant stenosis. Inflow: Patent without evidence of aneurysm, dissection, vasculitis or significant stenosis. Internal iliac arteries are patent bilaterally. Proximal Outflow: Bilateral common femoral and visualized portions of the superficial and profunda femoral arteries are patent without evidence of aneurysm, dissection, vasculitis or significant stenosis. Veins: The venous vasculature of the abdomen and pelvis is unremarkable. Specifically, the inferior and superior mesenteric veins, splenic vein and portal veins are patent. Review of the MIP images confirms the above findings. NON-VASCULAR Lower chest: No acute abnormality.  Hepatobiliary: Severe hepatic steatosis. No enhancing intrahepatic mass. No intra or extrahepatic biliary ductal dilation. Status post cholecystectomy. Pancreas: Unremarkable Spleen: Unremarkable Adrenals/Urinary Tract: Adrenal glands are unremarkable. Kidneys are normal, without renal calculi, focal lesion, or hydronephrosis. Bladder is unremarkable. Stomach/Bowel: No active gastrointestinal hemorrhage identified. Surgical changes of gastric sleeve resection, distal gastrectomy, and Roux-en-Y gastrojejunostomy are identified. No evidence of obstruction or focal inflammation. The stomach, small bowel, and large bowel are otherwise unremarkable. Appendix normal. No free intraperitoneal gas or fluid. Lymphatic: No pathologic adenopathy within the abdomen and pelvis. Reproductive: Status  post hysterectomy. No adnexal masses. Other: No abdominal wall hernia or abnormality. No abdominopelvic ascites. Musculoskeletal: Degenerative changes are seen within the lumbar spine. No acute bone abnormality. No lytic or blastic bone lesion. IMPRESSION: 1. No active gastrointestinal hemorrhage identified. No acute intra-abdominal pathology identified. 2. Severe hepatic steatosis. 3. Surgical changes of gastric sleeve resection, distal gastrectomy, and Roux-en-Y gastrojejunostomy. No evidence of obstruction or focal inflammation. Aortic Atherosclerosis (ICD10-I70.0). Electronically Signed   By: Helyn Numbers M.D.   On: 05/03/2022 18:34   DG Chest 2 View  Result Date: 05/03/2022 CLINICAL DATA:  Shortness of breath EXAM: CHEST - 2 VIEW COMPARISON:  11/17/2021 FINDINGS: The heart size and mediastinal contours are within normal limits. Both lungs are clear. The visualized skeletal structures are unremarkable. A thin linear radiodensity projects over the left shoulder on the frontal view and is presumably external to the patient. IMPRESSION: No active cardiopulmonary disease. Electronically Signed   By: Duanne Guess D.O.   On: 05/03/2022 15:22   CT Head Wo Contrast  Result Date: 05/03/2022 CLINICAL DATA:  Vertigo EXAM: CT HEAD WITHOUT CONTRAST TECHNIQUE: Contiguous axial images were obtained from the base of the skull through the vertex without intravenous contrast. RADIATION DOSE REDUCTION: This exam was performed according to the departmental dose-optimization program which includes automated exposure control, adjustment of the mA and/or kV according to patient size and/or use of iterative reconstruction technique. COMPARISON:  CT head 09/27/21 FINDINGS: Brain: No evidence of acute infarction, hemorrhage, hydrocephalus, extra-axial collection or mass lesion/mass effect. Vascular: No hyperdense vessel or unexpected calcification. Skull: Normal. Negative for fracture or focal lesion. Sinuses/Orbits: No  middle ear or mastoid effusion. Paranasal sinuses are clear. Bilateral lens replacement. Orbits otherwise unremarkable. Other: None. IMPRESSION: No acute intracranial abnormality. Electronically Signed   By: Lorenza Cambridge M.D.   On: 05/03/2022 14:33      LOS: 0 days    Joycelyn Das, MD [facility] Available via Epic secure chat 7am-7pm After these hours, please refer to coverage provider listed on [email or web address] 05/04/2022, 9:32 AM

## 2022-05-04 NOTE — Hospital Course (Signed)
Jennifer [name] is a 72 y.o. female with past medical history of iron deficiency anemia, hemorrhoids, hypertension, hyperlipidemia, peripheral neuropathy, sleep apnea, s/p bariatric surgery presented to hospital with near syncope at the grocery store.  This was associated with dizziness, diaphoresis, generalized weakness, urinary incontinence but without loss of consciousness.  Patient was having dark stools with right upper quadrant pain for 4 days prior to this presentation.  She does have a history of iron deficiency anemia and was on IV iron in the past.  In the ED initial vitals were stable.  Labs showed anemia with hemoglobin of 7.9 with sodium low at 130.  Urinalysis was negative for infection.  INR was 1.3.  AST and ALT were elevated at 300, 59 respectively with total bilirubin of 1.3.  Assessment and plan.  Black stool/melena:  On IV Protonix.  Received IV fluids.  Patient underwent CT angio GI bleed with no active GI hemorrhage but liver steatosis.  GI on board.  Continue to hold losartan/HCTZ.  History of gastritis duodenitis GERD and hemorrhoids.  Will follow GI recommendation.     Acute blood loss anemia, history of chronic iron deficiency anemia:  Hemoglobin is inpatient at 7.9.  Hemoglobin was 11.2 on 04/29/2022.  Patient received 1 unit of packed RBC.  Hemoglobin today at 8.8 after transfusion.     Near syncope, dizziness and chest pressure: Troponins negative.  EKG unremarkable.  Likely secondary to anemia.     Right upper quadrant pain/lump, elevated LFTs: CT abdomen pelvis without acute findings.  Hepatic steatosis noted.    Alcohol use disorder: Continue Ativan as per CIWA protocol.   Hyponatremia: Received IV fluids.  Likely secondary to alcohol use disorder.  Will continue to monitor sodium levels.   Essential hypertension,  Continue to hold losartan and Dyazide from home.  OSA on CPAP,   s/p bariatric surgery, supportive care.  hypothyroidism.  Continue Synthroid.

## 2022-05-04 NOTE — Addendum Note (Signed)
Addended by: Allegra Grana on: 05/04/2022 09:55 AM   Modules accepted: Orders

## 2022-05-05 ENCOUNTER — Ambulatory Visit: Payer: Medicare Other | Admitting: Physician Assistant

## 2022-05-05 ENCOUNTER — Encounter: Payer: Self-pay | Admitting: Internal Medicine

## 2022-05-05 ENCOUNTER — Inpatient Hospital Stay: Payer: Medicare Other | Admitting: Anesthesiology

## 2022-05-05 ENCOUNTER — Encounter
Admission: EM | Disposition: A | Discharge status: Home / Self Care | Payer: Self-pay | Source: Home / Self Care | Attending: Internal Medicine

## 2022-05-05 DIAGNOSIS — D62 Acute posthemorrhagic anemia: Secondary | ICD-10-CM | POA: Diagnosis not present

## 2022-05-05 DIAGNOSIS — I10 Essential (primary) hypertension: Secondary | ICD-10-CM

## 2022-05-05 DIAGNOSIS — F1090 Alcohol use, unspecified, uncomplicated: Secondary | ICD-10-CM | POA: Diagnosis not present

## 2022-05-05 DIAGNOSIS — R7401 Elevation of levels of liver transaminase levels: Secondary | ICD-10-CM

## 2022-05-05 HISTORY — PX: ESOPHAGOGASTRODUODENOSCOPY (EGD) WITH PROPOFOL: SHX5813

## 2022-05-05 HISTORY — PX: COLONOSCOPY WITH PROPOFOL: SHX5780

## 2022-05-05 LAB — BASIC METABOLIC PANEL
Anion gap: 4 — ABNORMAL LOW (ref 5–15)
BUN: 8 mg/dL (ref 8–23)
CO2: 25 mmol/L (ref 22–32)
Calcium: 7.8 mg/dL — ABNORMAL LOW (ref 8.9–10.3)
Chloride: 111 mmol/L (ref 98–111)
Creatinine, Ser: 0.73 mg/dL (ref 0.44–1.00)
GFR, Estimated: >60 mL/min (ref >60)
Glucose, Bld: 87 mg/dL (ref 70–99)
Potassium: 3.3 mmol/L — ABNORMAL LOW (ref 3.5–5.1)
Sodium: 140 mmol/L (ref 135–145)

## 2022-05-05 LAB — HEMOGLOBIN AND HEMATOCRIT, BLOOD
HCT: 23.7 % — ABNORMAL LOW (ref 36.0–46.0)
HCT: 27.2 % — ABNORMAL LOW (ref 36.0–46.0)
Hemoglobin: 7.9 g/dL — ABNORMAL LOW (ref 12.0–15.0)
Hemoglobin: 8.9 g/dL — ABNORMAL LOW (ref 12.0–15.0)

## 2022-05-05 LAB — MAGNESIUM: Magnesium: 1.9 mg/dL (ref 1.7–2.4)

## 2022-05-05 SURGERY — ESOPHAGOGASTRODUODENOSCOPY (EGD) WITH PROPOFOL
Anesthesia: General

## 2022-05-05 MED ORDER — SODIUM CHLORIDE 0.9 % IV SOLN: INTRAVENOUS | Status: DC | PRN

## 2022-05-05 MED ORDER — PROPOFOL 500 MG/50ML IV EMUL
INTRAVENOUS | Status: DC | PRN
  Administered 2022-05-05: 125 ug/kg/min via INTRAVENOUS

## 2022-05-05 MED ORDER — GLYCOPYRROLATE 0.2 MG/ML IJ SOLN
INTRAMUSCULAR | Status: DC | PRN
  Administered 2022-05-05: .2 mg via INTRAVENOUS

## 2022-05-05 MED ORDER — FOLIC ACID 1 MG PO TABS: 1.0000 mg | ORAL_TABLET | Freq: Every day | ORAL | 0 refills | Status: AC

## 2022-05-05 MED ORDER — PROPOFOL 10 MG/ML IV BOLUS
INTRAVENOUS | Status: DC | PRN
  Administered 2022-05-05: 80 mg via INTRAVENOUS

## 2022-05-05 MED ORDER — PROPOFOL 10 MG/ML IV BOLUS
INTRAVENOUS | Status: AC
  Filled 2022-05-05: qty 20

## 2022-05-05 MED ORDER — THIAMINE HCL 100 MG PO TABS: 100.0000 mg | ORAL_TABLET | Freq: Every day | ORAL | 0 refills | Status: AC

## 2022-05-05 MED ORDER — POTASSIUM CHLORIDE IN NACL 40-0.9 MEQ/L-% IV SOLN
INTRAVENOUS | Status: AC
  Filled 2022-05-05: qty 1000

## 2022-05-05 MED ORDER — LIDOCAINE HCL (CARDIAC) PF 100 MG/5ML IV SOSY
PREFILLED_SYRINGE | INTRAVENOUS | Status: DC | PRN
  Administered 2022-05-05: 50 mg via INTRAVENOUS

## 2022-05-05 NOTE — Transfer of Care (Signed)
Immediate Anesthesia Transfer of Care Note  Patient: Jennifer [name]  Procedure(s) Performed: ESOPHAGOGASTRODUODENOSCOPY (EGD) WITH PROPOFOL COLONOSCOPY WITH PROPOFOL  Patient Location: PACU  Anesthesia Type:General  Level of Consciousness: awake  Airway & Oxygen Therapy: Patient Spontanous Breathing  Post-op Assessment: Report given to RN and Post -op Vital signs reviewed and stable  Post vital signs: Reviewed and stable  Last Vitals:  Vitals Value Taken Time  BP 108/62 05/05/22 1256  Temp    Pulse 89 05/05/22 1256  Resp 17 05/05/22 1256  SpO2 100 % 05/05/22 1256  Vitals shown include unvalidated device data.  Last Pain:  Vitals:   05/05/22 1201  TempSrc: Temporal  PainSc: 0-No pain      Patients Stated Pain Goal: 2 (05/03/22 2104)  Complications: No notable events documented.

## 2022-05-05 NOTE — Discharge Summary (Signed)
Physician Discharge Summary   Patient: Jennifer [name] MRN: [id number] DOB: Sep 02, 1950  Admit date:     05/03/2022  Discharge date: 05/05/22  Discharge Physician: Alika Saladin   PCP: Allegra Grana, FNP   Recommendations at discharge:   Take medications as recommended  Discharge Diagnoses: Active Problems:   Acute blood loss anemia   Essential hypertension   OSA (obstructive sleep apnea)   Hypothyroidism   Transaminitis   Melena   Alcohol use disorder  Resolved Problems:   * No resolved hospital problems. *  Hospital Course:  Jennifer [name] is a 72 y.o. female with multiple medical problems including iron deficiency anemia, hemorrhoids, hypertension, hyperlipidemia, peripheral neuropathy, sleep apnea, herpes genitalis, constipation, anxiety, s/p bariatric surgery, who presented to the hospital with near syncope.  She said she was  at [employer] when she felt that she was "about to faint".  She held onto the cart and the cashier sat her down near the counter.  This was associated with dizziness, general weakness, diaphoresis and urinary incontinence.  She did not lose consciousness.  She also complained of dark black stools of about 4 days duration.  She also complains of right upper quadrant abdominal pain.  She said she fell about a year ago and she has noticed "a knot".  She complained of chest pressure that has been going on for about 2 days now.  She vomited some "mucus", when she got to the ED.  No hematemesis.  She has history of iron deficiency anemia and stopped IV iron in the past.  She has not used any aspirin, blood thinners or NSAIDs.     Assessment and Plan:  Acute blood loss anemia/symptomatic anemia Black stool/melena:  Noted to have a drop in her H&H from 11.2-7.9 Patient presented for evaluation of near syncope, dizziness and chest pressure all attributed to acute anemia Was transfused 1 unit of packed RBC Has not had any further bleeding  episodes Patient underwent CT angio GI bleed with no active GI hemorrhage but liver steatosis.   Appreciate GI input Patient is status post upper and lower endoscopy.  Lower endoscopy showed internal hemorrhoids and upper endoscopy was within normal limits.         Right upper quadrant pain/transaminitis CT abdomen pelvis without acute findings.  Hepatic steatosis noted.  Noted to have AST, 2 x ALT levels consistent with history of alcohol use disorder Patient counseled on the need to abstain from alcohol use     Alcohol use disorder:  No signs or symptoms of alcohol withdrawal during this hospitalization     Hyponatremia/Hypokalemia Most likely secondary to HCTZ use Supplement potassium Levels have improved with IV fluid hydration    Essential hypertension,  Continue Losartan    OSA  on CPAP,    s/p Bariatric surgery/Obesity (BMI 34.12 kg/m2) Complicates overall prognosis and care    Hypothyroidism.   Continue Synthroid.            Consultants: Gastroenterology Procedures performed: Upper and Lower endoscopy  Disposition: Home Diet recommendation:  Cardiac diet DISCHARGE MEDICATION: Allergies as of 05/05/2022       Reactions   Bee Pollen Itching   Celecoxib Hives   Hydrocodone Nausea Only   Pollen Extract Itching   Sodium Ferric Gluconate [ferrous Gluconate]    IV ferric gluconate - shortly after the infusion developed back pain shooting into left arm and bilateral hand swelling   Nasacort [triamcinolone] Other (See Comments)   Nasal - Nose Bleeds  Vicodin [hydrocodone-acetaminophen] Nausea Only        Medication List     STOP taking these medications    calcium citrate 950 (200 Ca) MG tablet Commonly known as: CALCITRATE - dosed in mg elemental calcium   triamterene-hydrochlorothiazide 37.5-25 MG capsule Commonly known as: DYAZIDE       TAKE these medications    acetaminophen 325 MG tablet Commonly known as: TYLENOL Take 2  tablets (650 mg total) by mouth every 6 (six) hours as needed for mild pain (or Fever >/= 101).   acyclovir 400 MG tablet Commonly known as: ZOVIRAX Take 1 tablet (400 mg total) by mouth daily.   fluticasone 50 MCG/ACT nasal spray Commonly known as: FLONASE SPRAY 1 SPRAY INTO BOTH NOSTRILS DAILY.   folic acid 1 MG tablet Commonly known as: FOLVITE Take 1 tablet (1 mg total) by mouth daily.   hydrocortisone 25 MG suppository Commonly known as: ANUSOL-HC Unwrap and insert 1 suppository rectally twice a day   levothyroxine 50 MCG tablet Commonly known as: SYNTHROID TAKE 1 TABLET (50 MCG TOTAL) BY MOUTH DAILY. DX CODE E03.9   losartan 100 MG tablet Commonly known as: COZAAR TAKE 1 TABLET BY MOUTH EVERYDAY AT BEDTIME What changed:  how much to take how to take this when to take this   multivitamin with minerals Tabs tablet Take 1 tablet by mouth daily.   pantoprazole 40 MG tablet Commonly known as: PROTONIX Take 1 tablet (40 mg total) by mouth 2 (two) times daily.   sodium chloride 0.65 % Soln nasal spray Commonly known as: OCEAN Place 2 sprays into both nostrils as needed for congestion.   thiamine 100 MG tablet Commonly known as: VITAMIN B1 Take 1 tablet (100 mg total) by mouth daily.   traZODone 50 MG tablet Commonly known as: DESYREL for sleep DX Code G47.00 What changed:  how much to take how to take this when to take this               Discharge Care Instructions  (From admission, onward)           Start     Ordered   05/05/22 0000  Discharge wound care:       Comments: Wound care to two open areas on right foot (heel and lateral foot): Cleanse with NS, pat dry. Cover with silver hydrofiber (Aquacel Ag+ Advantage, Lawson # [id number]). Top with ABD pad and secure with Kerlix roll gauze/paper tape. Place foot into Prevalon boot.   05/05/22 1605            Discharge Exam: Filed Weights   05/03/22 1304 05/03/22 1949  Weight: 82.6 kg 81.9 kg    Body mass index is 34.12 kg/m.  General: Obese built, not in obvious distress HENT:   Mild pallor noted oral mucosa is moist.  Chest:  Clear breath sounds.  Diminished breath sounds bilaterally. No crackles or wheezes.  CVS: S1 &S2 heard. No murmur.  Regular rate and rhythm. Abdomen: Soft, nontender, mild tenderness over the right upper quadrant.  Bowel sounds are heard.   Extremities: No cyanosis, clubbing or edema.  Peripheral pulses are palpable. Psych: Alert, awake and oriented, normal mood CNS:  No cranial nerve deficits.  Power equal in all extremities.   Skin: Warm and dry.  No rashes noted.  Condition at discharge: stable  The results of significant diagnostics from this hospitalization (including imaging, microbiology, ancillary and laboratory) are listed below for reference.   Imaging Studies: CT  ANGIO GI BLEED  Result Date: 05/03/2022 CLINICAL DATA:  Melena, anemia, presyncope EXAM: CTA ABDOMEN AND PELVIS WITHOUT AND WITH CONTRAST TECHNIQUE: Multidetector CT imaging of the abdomen and pelvis was performed using the standard protocol during bolus administration of intravenous contrast. Multiplanar reconstructed images and MIPs were obtained and reviewed to evaluate the vascular anatomy. RADIATION DOSE REDUCTION: This exam was performed according to the departmental dose-optimization program which includes automated exposure control, adjustment of the mA and/or kV according to patient size and/or use of iterative reconstruction technique. CONTRAST:  OMNIPAQUE IOHEXOL 350 MG/ML SOLN COMPARISON:  06/06/2021 FINDINGS: VASCULAR Aorta: Moderate atherosclerotic calcification. No hemodynamically significant stenosis. No aneurysm or dissection. No periaortic inflammatory change or fluid collection. Celiac: Patent without evidence of aneurysm, dissection, vasculitis or significant stenosis. SMA: Patent without evidence of aneurysm, dissection, vasculitis or significant stenosis. Renals:  Single right and dual left renal arteries are widely patent and demonstrate normal vascular morphology. No aneurysm or dissection. IMA: Patent without evidence of aneurysm, dissection, vasculitis or significant stenosis. Inflow: Patent without evidence of aneurysm, dissection, vasculitis or significant stenosis. Internal iliac arteries are patent bilaterally. Proximal Outflow: Bilateral common femoral and visualized portions of the superficial and profunda femoral arteries are patent without evidence of aneurysm, dissection, vasculitis or significant stenosis. Veins: The venous vasculature of the abdomen and pelvis is unremarkable. Specifically, the inferior and superior mesenteric veins, splenic vein and portal veins are patent. Review of the MIP images confirms the above findings. NON-VASCULAR Lower chest: No acute abnormality. Hepatobiliary: Severe hepatic steatosis. No enhancing intrahepatic mass. No intra or extrahepatic biliary ductal dilation. Status post cholecystectomy. Pancreas: Unremarkable Spleen: Unremarkable Adrenals/Urinary Tract: Adrenal glands are unremarkable. Kidneys are normal, without renal calculi, focal lesion, or hydronephrosis. Bladder is unremarkable. Stomach/Bowel: No active gastrointestinal hemorrhage identified. Surgical changes of gastric sleeve resection, distal gastrectomy, and Roux-en-Y gastrojejunostomy are identified. No evidence of obstruction or focal inflammation. The stomach, small bowel, and large bowel are otherwise unremarkable. Appendix normal. No free intraperitoneal gas or fluid. Lymphatic: No pathologic adenopathy within the abdomen and pelvis. Reproductive: Status post hysterectomy. No adnexal masses. Other: No abdominal wall hernia or abnormality. No abdominopelvic ascites. Musculoskeletal: Degenerative changes are seen within the lumbar spine. No acute bone abnormality. No lytic or blastic bone lesion. IMPRESSION: 1. No active gastrointestinal hemorrhage identified.  No acute intra-abdominal pathology identified. 2. Severe hepatic steatosis. 3. Surgical changes of gastric sleeve resection, distal gastrectomy, and Roux-en-Y gastrojejunostomy. No evidence of obstruction or focal inflammation. Aortic Atherosclerosis (ICD10-I70.0). Electronically Signed   By: Helyn Numbers M.D.   On: 05/03/2022 18:34   DG Chest 2 View  Result Date: 05/03/2022 CLINICAL DATA:  Shortness of breath EXAM: CHEST - 2 VIEW COMPARISON:  11/17/2021 FINDINGS: The heart size and mediastinal contours are within normal limits. Both lungs are clear. The visualized skeletal structures are unremarkable. A thin linear radiodensity projects over the left shoulder on the frontal view and is presumably external to the patient. IMPRESSION: No active cardiopulmonary disease. Electronically Signed   By: Duanne Guess D.O.   On: 05/03/2022 15:22   CT Head Wo Contrast  Result Date: 05/03/2022 CLINICAL DATA:  Vertigo EXAM: CT HEAD WITHOUT CONTRAST TECHNIQUE: Contiguous axial images were obtained from the base of the skull through the vertex without intravenous contrast. RADIATION DOSE REDUCTION: This exam was performed according to the departmental dose-optimization program which includes automated exposure control, adjustment of the mA and/or kV according to patient size and/or use of iterative reconstruction technique. COMPARISON:  CT head 09/27/21 FINDINGS: Brain: No evidence of acute infarction, hemorrhage, hydrocephalus, extra-axial collection or mass lesion/mass effect. Vascular: No hyperdense vessel or unexpected calcification. Skull: Normal. Negative for fracture or focal lesion. Sinuses/Orbits: No middle ear or mastoid effusion. Paranasal sinuses are clear. Bilateral lens replacement. Orbits otherwise unremarkable. Other: None. IMPRESSION: No acute intracranial abnormality. Electronically Signed   By: Lorenza Cambridge M.D.   On: 05/03/2022 14:33    Microbiology: Results for orders placed or performed in  visit on 09/15/21  Fecal occult blood, imunochemical     Status: Abnormal   Collection Time: 09/16/21 10:07 AM   Specimen: Stool  Result Value Ref Range Status   Fecal Occult Bld Positive (A) Negative Final    Labs: CBC: Recent Labs  Lab 04/29/22 0848 05/03/22 1312 05/03/22 2032 05/04/22 0401 05/04/22 1046 05/04/22 1706 05/05/22 0350  WBC 5.6 6.1  --  5.3  --   --   --   NEUTROABS 3.1  --   --   --   --   --   --   HGB 11.2* 7.9* 7.8* 8.8* 9.3* 9.3* 7.9*  HCT 34.2* 24.1* 23.1* 26.1* 27.9* 27.3* 23.7*  MCV 94.2 91.3  --  87.3  --   --   --   PLT 176.0 133*  --  116*  --   --   --    Basic Metabolic Panel: Recent Labs  Lab 04/29/22 0848 05/03/22 1312 05/04/22 0401 05/05/22 0350  NA 133* 130* 138 140  K 4.3 4.6 4.0 3.3*  CL 97 99 106 111  CO2 25 18* 25 25  GLUCOSE 93 130* 99 87  BUN 9 11 12 8   CREATININE 0.86 0.63 0.74 0.73  CALCIUM 8.9 7.7* 8.1* 7.8*  MG  --   --   --  1.9   Liver Function Tests: Recent Labs  Lab 04/29/22 0848 05/03/22 2032  AST 343* 300*  ALT 69* 59*  ALKPHOS 243* 175*  BILITOT 0.9 1.3*  PROT 6.7 6.0*  ALBUMIN 4.0 3.2*   CBG: No results for input(s): "GLUCAP" in the last 168 hours.  Discharge time spent: greater than 30 minutes.  Signed: Lucile Shutters, MD [facility] 05/05/2022

## 2022-05-05 NOTE — Progress Notes (Signed)
Mobility Specialist - Progress Note   05/05/22 0905  Mobility  Activity Ambulated independently in room;Ambulated independently to bathroom  Level of Assistance Independent  Assistive Device None  Distance Ambulated (ft) 10 ft  Activity Response Tolerated well  Mobility Referral Yes  $Mobility charge 1 Mobility   Pt OOB on RA upon arrival. Pt ambulates to/from bathroom indep. Pt returns to bed with needs in reach.   Terrilyn Saver  Mobility Specialist  05/05/22 9:06 AM

## 2022-05-05 NOTE — Anesthesia Preprocedure Evaluation (Addendum)
Anesthesia Evaluation  Patient identified by MRN, date of birth, ID band Patient awake    Reviewed: Allergy & Precautions, NPO status , Patient's Chart, lab work & pertinent test results  History of Anesthesia Complications (+) history of anesthetic complications  Airway Mallampati: III  TM Distance: >3 FB Neck ROM: full    Dental  (+) Poor Dentition   Pulmonary sleep apnea and Continuous Positive Airway Pressure Ventilation , former smoker   Pulmonary exam normal        Cardiovascular hypertension, Normal cardiovascular exam+ dysrhythmias      Neuro/Psych  PSYCHIATRIC DISORDERS Anxiety Depression     Neuromuscular disease    GI/Hepatic Neg liver ROS,GERD  Medicated,,  Endo/Other  diabetesHypothyroidism    Renal/GU negative Renal ROS  negative genitourinary   Musculoskeletal  (+) Arthritis ,    Abdominal   Peds  Hematology  (+) Blood dyscrasia, anemia   Anesthesia Other Findings Past Medical History: No date: Anemia No date: Anxiety No date: Bariatric surgery status No date: Complication of anesthesia     Comment:  Diificulty breathing for about 15 minutes after               bariatric surgery No date: Constipation No date: COVID-19 No date: Dysrhythmia No date: Elevated liver enzymes No date: GERD (gastroesophageal reflux disease) No date: Hemorrhoids No date: Herpes genitalis No date: High cholesterol No date: Hyperlipidemia No date: Hypertension No date: Hypothyroidism 02/09/2021: IDA (iron deficiency anemia) No date: Neuropathy No date: Osteoarthritis No date: Sleep apnea     Comment:  CPAP  Past Surgical History: No date: ABDOMINAL HYSTERECTOMY     Comment:  total for fibroids no h/o abnormal pap 2015: bariatric sleeve 1998: BREAST EXCISIONAL BIOPSY; Left No date: carpal tunnel repair 11/01/2021: CATARACT EXTRACTION W/PHACO; Right     Comment:  Procedure: CATARACT EXTRACTION PHACO AND  INTRAOCULAR               LENS PLACEMENT (IOC) RIGHT;  Surgeon: Nevada Crane,              MD;  Location: [facility];  Service:               Ophthalmology;  Laterality: Right;  sleep               apnea 4.95 00:49.7 11/15/2021: CATARACT EXTRACTION W/PHACO; Left     Comment:  Procedure: CATARACT EXTRACTION PHACO AND INTRAOCULAR               LENS PLACEMENT (IOC) LEFT 4.94 00:32.3;  Surgeon: Nevada Crane, MD;  Location: [facility];                Service: Ophthalmology;  Laterality: Left;  sleep apnea 02/11/2016: COLONOSCOPY WITH PROPOFOL; N/A     Comment:  Procedure: COLONOSCOPY WITH PROPOFOL;  Surgeon: Wyline Mood, MD;  Location: [facility] END[facility]COPY;  Service: Endoscopy;              Laterality: N/A; 10/01/2019: COLONOSCOPY WITH PROPOFOL; N/A     Comment:  Procedure: COLONOSCOPY WITH PROPOFOL;  Surgeon: Wyline Mood, MD;  Location: [facility] END[facility]COPY;  Service:               Gastroenterology;  Laterality: N/A; 11/19/2020: COLONOSCOPY WITH PROPOFOL; N/A     Comment:  Procedure: COLONOSCOPY WITH PROPOFOL;  Surgeon: Wyline Mood, MD;  Location: [facility] ENDOSCOPY;  Service:               Gastroenterology;  Laterality: N/A; 12/02/2019: ESOPHAGOGASTRODUODENOSCOPY (EGD) WITH PROPOFOL; N/A     Comment:  Procedure: ESOPHAGOGASTRODUODENOSCOPY (EGD) WITH               PROPOFOL;  Surgeon: Wyline Mood, MD;  Location: [facility]               ENDOSCOPY;  Service: Gastroenterology;  Laterality: N/A; 11/19/2020: ESOPHAGOGASTRODUODENOSCOPY (EGD) WITH PROPOFOL; N/A     Comment:  Procedure: ESOPHAGOGASTRODUODENOSCOPY (EGD) WITH               PROPOFOL;  Surgeon: Wyline Mood, MD;  Location: [facility]               ENDOSCOPY;  Service: Gastroenterology;  Laterality: N/A; 02/06/2021: FLEXIBLE SIGMOIDOSCOPY; N/A     Comment:  Procedure: FLEXIBLE SIGMOIDOSCOPY;  Surgeon: Jaynie Collins, DO;  Location: [facility] ENDOSCOPY;  Service:                Gastroenterology;  Laterality: N/A; 06/07/2021: GIVENS CAPSULE STUDY; N/A     Comment:  Procedure: GIVENS CAPSULE STUDY;  Surgeon: Toney Reil, MD;  Location: [facility] ENDOSCOPY;  Service:               Gastroenterology;  Laterality: N/A; 06/09/2021: GIVENS CAPSULE STUDY; N/A     Comment:  Procedure: GIVENS CAPSULE STUDY;  Surgeon: Toney Reil, MD;  Location: [facility] ENDOSCOPY;  Service:               Gastroenterology;  Laterality: N/A; No date: HEMORRHOID SURGERY No date: LAPAROSCOPIC GASTRIC RESTRICTIVE DUODENAL PROCEDURE  (DUODENAL SWITCH); Bilateral     Comment:  2020  BMI    Body Mass Index: 34.12 kg/m      Reproductive/Obstetrics negative OB ROS                             Anesthesia Physical Anesthesia Plan  ASA: 3  Anesthesia Plan: General   Post-op Pain Management: Minimal or no pain anticipated   Induction: Intravenous  PONV Risk Score and Plan: Propofol infusion and TIVA  Airway Management Planned: Natural Airway and Nasal Cannula  Additional Equipment:   Intra-op Plan:   Post-operative Plan:   Informed Consent: I have reviewed the patients History and Physical, chart, labs and discussed the procedure including the risks, benefits and alternatives for the proposed anesthesia with the patient or authorized representative who has indicated his/her understanding and acceptance.     Dental Advisory Given  Plan Discussed with: Anesthesiologist, CRNA and Surgeon  Anesthesia Plan Comments: (Patient consented for risks of anesthesia including but not limited to:  - adverse reactions to medications - risk of airway placement if required - damage to eyes, teeth, lips or other oral mucosa - nerve damage due to positioning  - sore throat or hoarseness - Damage to heart, brain, nerves, lungs, other parts of body or loss of life  Patient voiced understanding.)        Anesthesia Quick  Evaluation

## 2022-05-05 NOTE — Op Note (Addendum)
[facility] Gastroenterology Patient Name: Jennifer [name] Procedure Date: 05/05/2022 11:20 AM MRN: [id number] Account #: [id number] Date of Birth: October 21, 1950 Admit Type: Inpatient Age: 72 Room: [facility] ENDO ROOM 3 Gender: Female Note Status: Supervisor Override Instrument Name: Nelda Marseille [id number] Procedure:             Colonoscopy Indications:           Gastrointestinal bleeding Providers:             Eather Colas MD, MD Referring MD:          Lyn Records. Arnett (Referring MD) Medicines:             Monitored Anesthesia Care Complications:         No immediate complications. Procedure:             Pre-Anesthesia Assessment:                        - Prior to the procedure, a History and Physical was                         performed, and patient medications and allergies were                         reviewed. The patient is competent. The risks and                         benefits of the procedure and the sedation options and                         risks were discussed with the patient. All questions                         were answered and informed consent was obtained.                         Patient identification and proposed procedure were                         verified by the physician, the nurse, the                         anesthesiologist, the anesthetist and the technician                         in the endoscopy suite. Mental Status Examination:                         alert and oriented. Airway Examination: normal                         oropharyngeal airway and neck mobility. Respiratory                         Examination: clear to auscultation. CV Examination:                         normal. Prophylactic Antibiotics: The patient does not  require prophylactic antibiotics. Prior                         Anticoagulants: The patient has taken no anticoagulant                         or antiplatelet agents. ASA Grade  Assessment: III - A                         patient with severe systemic disease. After reviewing                         the risks and benefits, the patient was deemed in                         satisfactory condition to undergo the procedure. The                         anesthesia plan was to use monitored anesthesia care                         (MAC). Immediately prior to administration of                         medications, the patient was re-assessed for adequacy                         to receive sedatives. The heart rate, respiratory                         rate, oxygen saturations, blood pressure, adequacy of                         pulmonary ventilation, and response to care were                         monitored throughout the procedure. The physical                         status of the patient was re-assessed after the                         procedure.                        After obtaining informed consent, the colonoscope was                         passed under direct vision. Throughout the procedure,                         the patient's blood pressure, pulse, and oxygen                         saturations were monitored continuously. The                         Colonoscope was introduced through the anus and  advanced to the the terminal ileum. The colonoscopy                         was technically difficult and complex due to a                         redundant colon. Successful completion of the                         procedure was aided by applying abdominal pressure.                         The patient tolerated the procedure well. The quality                         of the bowel preparation was fair. The terminal ileum,                         ileocecal valve, appendiceal orifice, and rectum were                         photographed. Findings:      The perianal and digital rectal examinations were normal.      The terminal ileum appeared  normal.      Internal hemorrhoids were found during retroflexion. The hemorrhoids       were Grade II (internal hemorrhoids that prolapse but reduce       spontaneously).      The exam was otherwise without abnormality on direct and retroflexion       views. Impression:            - Preparation of the colon was fair.                        - The examined portion of the ileum was normal.                        - Internal hemorrhoids.                        - The examination was otherwise normal on direct and                         retroflexion views.                        - No specimens collected. Recommendation:        - Return patient to hospital ward for ongoing care.                        - Advance diet as tolerated.                        - Continue present medications.                        - Return to GI clinic.                        - No further GI interventions indicated as an  inpatient. Can f/u with her outpatient GI doctor. Do                         recommend check vitamin levels given history of                         duodenal switch. Very common for them to have vitamin                         deficiencies. Procedure Code(s):     --- Professional ---                        [phone], Colonoscopy, flexible; diagnostic, including                         collection of specimen(s) by brushing or washing, when                         performed (separate procedure) Diagnosis Code(s):     --- Professional ---                        K64.1, Second degree hemorrhoids                        K92.2, Gastrointestinal hemorrhage, unspecified CPT copyright 2022 American [employer]. All rights reserved. The codes documented in this report are preliminary and upon coder review may  be revised to meet current compliance requirements. Eather Colas MD, MD 05/05/2022 1:07:31 PM Number of Addenda: 0 Note Initiated On: 05/05/2022 11:20 AM Scope Withdrawal  Time: 0 hours 4 minutes 55 seconds  Total Procedure Duration: 0 hours 16 minutes 28 seconds  Estimated Blood Loss:  Estimated blood loss: none.      [facility]

## 2022-05-05 NOTE — Op Note (Addendum)
[facility] Gastroenterology Patient Name: Jennifer [name] Procedure Date: 05/05/2022 11:20 AM MRN: [id number] Account #: [id number] Date of Birth: March 22, 1950 Admit Type: Inpatient Age: 72 Room: [facility] ENDO ROOM 3 Gender: Female Note Status: Supervisor Override Instrument Name: Patton Salles Endoscope [id number] Procedure:             Upper GI endoscopy Indications:           Gastrointestinal bleeding of unknown origin Providers:             Eather Colas MD, MD Referring MD:          Lyn Records. Arnett (Referring MD) Medicines:             Monitored Anesthesia Care Complications:         No immediate complications. Procedure:             Pre-Anesthesia Assessment:                        - Prior to the procedure, a History and Physical was                         performed, and patient medications and allergies were                         reviewed. The patient is competent. The risks and                         benefits of the procedure and the sedation options and                         risks were discussed with the patient. All questions                         were answered and informed consent was obtained.                         Patient identification and proposed procedure were                         verified by the physician, the nurse, the                         anesthesiologist, the anesthetist and the technician                         in the endoscopy suite. Mental Status Examination:                         alert and oriented. Airway Examination: normal                         oropharyngeal airway and neck mobility. Respiratory                         Examination: clear to auscultation. CV Examination:                         normal. Prophylactic Antibiotics: The patient does not  require prophylactic antibiotics. Prior                         Anticoagulants: The patient has taken no anticoagulant                         or  antiplatelet agents. ASA Grade Assessment: III - A                         patient with severe systemic disease. After reviewing                         the risks and benefits, the patient was deemed in                         satisfactory condition to undergo the procedure. The                         anesthesia plan was to use monitored anesthesia care                         (MAC). Immediately prior to administration of                         medications, the patient was re-assessed for adequacy                         to receive sedatives. The heart rate, respiratory                         rate, oxygen saturations, blood pressure, adequacy of                         pulmonary ventilation, and response to care were                         monitored throughout the procedure. The physical                         status of the patient was re-assessed after the                         procedure.                        After obtaining informed consent, the endoscope was                         passed under direct vision. Throughout the procedure,                         the patient's blood pressure, pulse, and oxygen                         saturations were monitored continuously. The Endoscope                         was introduced through the mouth, and advanced to the  duodenal bulb. The upper GI endoscopy was accomplished                         without difficulty. The patient tolerated the                         procedure well. Findings:      The examined esophagus was normal.      A small non-bleeding diverticulum was found in the cardia.      Evidence of a sleeve gastrectomy was found in the stomach. This was       characterized by healthy appearing mucosa.      There was evidence of a duodenal switch in the duodenal bulb. This was       characterized by healthy appearing mucosa. Impression:            - Normal esophagus.                        - Gastric  diverticulum.                        - A sleeve gastrectomy was found, characterized by                         healthy appearing mucosa.                        - Duodenal switch, characterized by healthy appearing                         mucosa was found.                        - No specimens collected. Recommendation:        - Return patient to hospital ward for ongoing care.                        - Advance diet as tolerated.                        - Continue present medications.                        - Perform a colonoscopy today. Procedure Code(s):     --- Professional ---                        [phone], Esophagogastroduodenoscopy, flexible,                         transoral; diagnostic, including collection of                         specimen(s) by brushing or washing, when performed                         (separate procedure) Diagnosis Code(s):     --- Professional ---                        K31.4, Gastric diverticulum  Z98.84, Bariatric surgery status                        K92.2, Gastrointestinal hemorrhage, unspecified CPT copyright 2022 American Medical Association. All rights reserved. The codes documented in this report are preliminary and upon coder review may  be revised to meet current compliance requirements. Eather Colas MD, MD 05/05/2022 1:03:51 PM Number of Addenda: 0 Note Initiated On: 05/05/2022 11:20 AM Estimated Blood Loss:  Estimated blood loss: none.      [facility]

## 2022-05-06 ENCOUNTER — Telehealth: Payer: Self-pay | Admitting: *Deleted

## 2022-05-06 ENCOUNTER — Encounter: Payer: Self-pay | Admitting: Gastroenterology

## 2022-05-06 NOTE — Transitions of Care (Post Inpatient/ED Visit) (Signed)
   05/06/2022  Name: Timiyah Romito MRN: [id number] DOB: 04/21/1950  Today's TOC FU Call Status: Today's TOC FU Call Status:: Unsuccessul Call (1st Attempt) Unsuccessful Call (1st Attempt) Date: 05/06/22  Attempted to reach the patient regarding the most recent Inpatient/ED visit.  Follow Up Plan: Additional outreach attempts will be made to reach the patient to complete the Transitions of Care (Post Inpatient/ED visit) call.   Gean Maidens BSN RN [facility] [phone]

## 2022-05-06 NOTE — Anesthesia Postprocedure Evaluation (Signed)
Anesthesia Post Note  Patient: [employer]  Procedure(s) Performed: ESOPHAGOGASTRODUODENOSCOPY (EGD) WITH PROPOFOL COLONOSCOPY WITH PROPOFOL  Patient location during evaluation: Endoscopy Anesthesia Type: General Level of consciousness: awake and alert Pain management: pain level controlled Vital Signs Assessment: post-procedure vital signs reviewed and stable Respiratory status: spontaneous breathing, nonlabored ventilation, respiratory function stable and patient connected to nasal cannula oxygen Cardiovascular status: blood pressure returned to baseline and stable Postop Assessment: no apparent nausea or vomiting Anesthetic complications: no   No notable events documented.   Last Vitals:  Vitals:   05/05/22 1316 05/05/22 1537  BP: (!) 141/72 (!) 148/70  Pulse: 72 64  Resp: 17 17  Temp:  36.8 C  SpO2: 100% 100%    Last Pain:  Vitals:   05/05/22 1316  TempSrc:   PainSc: 0-No pain                 Jennifer [name]

## 2022-05-09 ENCOUNTER — Ambulatory Visit: Payer: Medicare Other

## 2022-05-09 ENCOUNTER — Ambulatory Visit: Payer: Medicare Other | Admitting: Gastroenterology

## 2022-05-09 ENCOUNTER — Telehealth: Payer: Self-pay | Admitting: *Deleted

## 2022-05-09 ENCOUNTER — Encounter: Payer: Medicare Other | Admitting: Physician Assistant

## 2022-05-09 DIAGNOSIS — L89613 Pressure ulcer of right heel, stage 3: Secondary | ICD-10-CM | POA: Diagnosis not present

## 2022-05-09 DIAGNOSIS — I10 Essential (primary) hypertension: Secondary | ICD-10-CM | POA: Diagnosis not present

## 2022-05-09 DIAGNOSIS — L97528 Non-pressure chronic ulcer of other part of left foot with other specified severity: Secondary | ICD-10-CM | POA: Diagnosis not present

## 2022-05-09 DIAGNOSIS — L89621 Pressure ulcer of left heel, stage 1: Secondary | ICD-10-CM | POA: Diagnosis not present

## 2022-05-09 DIAGNOSIS — E7800 Pure hypercholesterolemia, unspecified: Secondary | ICD-10-CM | POA: Diagnosis not present

## 2022-05-09 DIAGNOSIS — L89623 Pressure ulcer of left heel, stage 3: Secondary | ICD-10-CM | POA: Diagnosis not present

## 2022-05-09 DIAGNOSIS — G629 Polyneuropathy, unspecified: Secondary | ICD-10-CM | POA: Diagnosis not present

## 2022-05-09 DIAGNOSIS — G9009 Other idiopathic peripheral autonomic neuropathy: Secondary | ICD-10-CM | POA: Diagnosis not present

## 2022-05-09 NOTE — Progress Notes (Addendum)
Jennifer, [name] ([id number]) [id number] Page 1 of 8 Visit Report for 05/09/2022 Arrival Information Details Patient Name: Date of Service: Jennifer [name] 05/09/2022 12:15 PM Medical Record Number: [id number] Patient Account Number: [id number] Date of Birth/Sex: Treating RN: 09-Aug-1950 (72 y.o. Jennifer [name] Primary Care Akeela Busk: [name] Other Clinician: Referring Minha Fulco: Treating Jefferson Fullam/Extender: [name] in Treatment: 6 Visit Information History Since Last Visit Added or deleted any medications: No Patient Arrived: [place] Hospitalized since last visit: Yes Arrival Time: 12:22 Has Dressing in Place as Prescribed: Yes Accompanied By: self Pain Present Now: No Transfer Assistance: None Patient Identification Verified: Yes Secondary Verification Process Completed: Yes Patient Requires Transmission-Based Precautions: No Patient Has Alerts: Yes Patient Alerts: Not diabetic R ABI 1.20 TBI 1.02 L ABI 1.26 TBI 1.00 Electronic Signature(s) Signed: 05/09/2022 1:37:50 PM By: Midge Aver MSN RN CNS WTA Entered By: Midge Aver on 05/09/2022 09:45:50 -------------------------------------------------------------------------------- Clinic Level of Care Assessment Details Patient Name: Date of Service: Jennifer [name] 05/09/2022 12:15 PM Medical Record Number: [id number] Patient Account Number: [id number] Date of Birth/Sex: Treating RN: [birth year] (72 y.o. Jennifer [name] Primary Care Sharilynn Cassity: [name] Other Clinician: Referring Buck Mcaffee: Treating Baltasar Twilley/Extender: [name] in Treatment: 6 Clinic Level of Care Assessment Items TOOL 1 Quantity Score []  - 0 Use when EandM and Procedure is performed on INITIAL visit ASSESSMENTS - Nursing Assessment / Reassessment []  - 0 General Physical Exam (combine w/ comprehensive assessment (listed just below) when performed on new pt.  evals) []  - 0 Comprehensive Assessment (HX, ROS, Risk Assessments, Wounds Hx, etc.) ASSESSMENTS - Wound and Skin Assessment / Reassessment []  - 0 Dermatologic / Skin Assessment (not related to wound area) GENYSIS, [place] ([id number]) [phone].pdf Page 2 of 8 ASSESSMENTS - Ostomy and/or Continence Assessment and Care []  - 0 Incontinence Assessment and Management []  - 0 Ostomy Care Assessment and Management (repouching, etc.) PROCESS - Coordination of Care []  - 0 Simple Patient / Family Education for ongoing care []  - 0 Complex (extensive) Patient / Family Education for ongoing care []  - 0 Staff obtains Chiropractor, Records, T Results / Process Orders est []  - 0 Staff telephones HHA, Nursing Homes / Clarify orders / etc []  - 0 Routine Transfer to another Facility (non-emergent condition) []  - 0 Routine Hospital Admission (non-emergent condition) []  - 0 New Admissions / Manufacturing engineer / Ordering NPWT Apligraf, etc. , []  - 0 Emergency Hospital Admission (emergent condition) PROCESS - Special Needs []  - 0 Pediatric / Minor Patient Management []  - 0 Isolation Patient Management []  - 0 Hearing / Language / Visual special needs []  - 0 Assessment of Community assistance (transportation, D/C planning, etc.) []  - 0 Additional assistance / Altered mentation []  - 0 Support Surface(s) Assessment (bed, cushion, seat, etc.) INTERVENTIONS - Miscellaneous []  - 0 External ear exam []  - 0 Patient Transfer (multiple staff / Nurse, adult / Similar devices) []  - 0 Simple Staple / Suture removal (25 or less) []  - 0 Complex Staple / Suture removal (26 or more) []  - 0 Hypo/Hyperglycemic Management (do not check if billed separately) []  - 0 Ankle / Brachial Index (ABI) - do not check if billed separately Has the patient been seen at the hospital within the last three years: Yes Total Score: 0 Level Of Care: ____ Electronic Signature(s) Signed:  05/09/2022 1:37:50 PM By: Midge Aver MSN RN CNS WTA Entered By: Midge Aver on 05/09/2022 09:58:33 -------------------------------------------------------------------------------- Encounter Discharge Information Details Patient Name: Date of  Service: Jennifer [name] 05/09/2022 12:15 PM Medical Record Number: [id number] Patient Account Number: [id number] Date of Birth/Sex: Treating RN: [birth year] (72 y.o. Jennifer [name] Primary Care Kano Heckmann: [name] Other Clinician: Referring Lyndell Gillyard: Treating Harly Pipkins/Extender: [name] in Treatment: 6 Encounter Discharge Information Items Post Procedure [place], [place] ([id number]) [id number] Page 3 of 8 Discharge Condition: Stable Temperature (F): 97.6 Ambulatory Status: Ambulatory Pulse (bpm): 64 Discharge Destination: Home Respiratory Rate (breaths/min): 16 Transportation: Private Auto Blood Pressure (mmHg): 146/74 Accompanied By: self Schedule Follow-up Appointment: Yes Clinical Summary of Care: Electronic Signature(s) Signed: 05/09/2022 1:37:50 PM By: Midge Aver MSN RN CNS WTA Entered By: Midge Aver on 05/09/2022 09:59:17 -------------------------------------------------------------------------------- Lower Extremity Assessment Details Patient Name: Date of Service: [name] 05/09/2022 12:15 PM Medical Record Number: [id number] Patient Account Number: [id number] Date of Birth/Sex: Treating RN: [birth year] (72 y.o. Jennifer [name] Primary Care Tyquon Near: [name] Other Clinician: Referring Annelle Behrendt: Treating Rifka Ramey/Extender: [name] in Treatment: 6 Electronic Signature(s) Signed: 05/09/2022 1:37:50 PM By: Midge Aver MSN RN CNS WTA Entered By: Midge Aver on 05/09/2022 09:58:10 -------------------------------------------------------------------------------- Multi Wound Chart Details Patient Name: Date of  Service: Court Joy [facility] 05/09/2022 12:15 PM Medical Record Number: [id number] Patient Account Number: [id number] Date of Birth/Sex: Treating RN: [birth year] (72 y.o. Jennifer [name] Primary Care Phyllicia Dudek: [name] Other Clinician: Referring Aran Menning: Treating Ayde Record/Extender: [name] in Treatment: 6 Vital Signs Height(in): 61 Pulse(bpm): 64 Weight(lbs): 185 Blood Pressure(mmHg): 146/74 Body Mass Index(BMI): 35 Temperature(F): 97.6 Respiratory Rate(breaths/min): 18 [1:Photos:] [N/A:N/A [id number] Page 4 of 8] Right, Posterior Calcaneus Left, Medial Calcaneus N/A Wound Location: Pressure Injury Blister N/A Wounding Event: Pressure Ulcer Pressure Ulcer N/A Primary Etiology: Cataracts, Hypertension, History of Cataracts, Hypertension, History of N/A Comorbid History: pressure wounds, Neuropathy pressure wounds, Neuropathy 01/13/2022 05/05/2022 N/A Date Acquired: 6 0 N/A Weeks of Treatment: Open Open N/A Wound Status: No No N/A Wound Recurrence: 0.4x0.5x0.2 2.5x2.5x0.1 N/A Measurements L x W x D (cm) 0.157 4.909 N/A A (cm) : rea 0.031 0.491 N/A Volume (cm) : 66.70% N/A N/A % Reduction in A rea: 34.00% N/A N/A % Reduction in Volume: Category/Stage III Category/Stage II N/A Classification: Small Medium N/A Exudate A mount: Serous Serosanguineous N/A Exudate Type: amber red, brown N/A Exudate Color: Distinct, outline attached N/A N/A Wound Margin: Medium (34-66%) Large (67-100%) N/A Granulation A mount: N/A Red N/A Granulation Quality: Medium (34-66%) None Present (0%) N/A Necrotic A mount: Fat Layer (Subcutaneous Tissue): Yes Fat Layer (Subcutaneous Tissue): Yes N/A Exposed Structures: Fascia: No Fascia: No Tendon: No Tendon: No Muscle: No Muscle: No Joint: No Joint: No Bone: No Bone: No Small (1-33%) Medium (34-66%) N/A Epithelialization: Debridement - Excisional Debridement -  Selective/Open Wound N/A Debridement: Pre-procedure Verification/Time Out 12:47 12:47 N/A Taken: Lidocaine 4% Topical Solution Lidocaine 4% Topical Solution N/A Pain Control: Callus, Subcutaneous, Slough N/A N/A Tissue Debrided: Skin/Subcutaneous Tissue Skin/Epidermis N/A Level: 0.16 4.91 N/A Debridement A (sq cm): rea Curette Curette N/A Instrument: Minimum Minimum N/A Bleeding: Pressure Pressure N/A Hemostasis A chieved: 0 0 N/A Procedural Pain: 0 0 N/A Post Procedural Pain: Procedure was tolerated well Procedure was tolerated well N/A Debridement Treatment Response: 0.4x0.5x0.3 2.5x2.5x0.3 N/A Post Debridement Measurements L x W x D (cm) 0.047 1.473 N/A Post Debridement Volume: (cm) Category/Stage III Category/Stage I N/A Post Debridement Stage: Debridement Debridement N/A Procedures Performed: Treatment Notes Electronic Signature(s) Signed: 05/09/2022 1:37:50 PM By: Midge Aver MSN RN CNS WTA Entered By: Katrinka Blazing,  Chip Boer on 05/09/2022 09:58:14 -------------------------------------------------------------------------------- Pain Assessment Details Patient Name: Date of Service: Jennifer [name] 05/09/2022 12:15 PM Medical Record Number: [id number] Patient Account Number: [id number] Date of Birth/Sex: Treating RN: [birth year] (72 y.o. Jennifer [name] Primary Care Laurinda Carreno: [name] Other Clinician: [name] ([id number]) [id number] Page 5 of 8 Referring Margueritte Guthridge: Treating Isaish Alemu/Extender: [name] in Treatment: 6 Active Problems Location of Pain Severity and Description of Pain Patient Has Paino Yes Site Locations Pain Location: Pain in Ulcers With Dressing Change: Yes Rate the pain. Current Pain Level: 6 Worst Pain Level: 8 Least Pain Level: 3 Tolerable Pain Level: 2 Pain Management and Medication Current Pain Management: Electronic Signature(s) Signed: 05/09/2022 1:37:50 PM By:  Midge Aver MSN RN CNS WTA Entered By: Midge Aver on 05/09/2022 09:46:20 -------------------------------------------------------------------------------- Wound Assessment Details Patient Name: Date of Service: Jennifer [name] 05/09/2022 12:15 PM Medical Record Number: [id number] Patient Account Number: [id number] Date of Birth/Sex: Treating RN: [birth year] (72 y.o. Jennifer [name] Primary Care Lavren Lewan: [name] Other Clinician: Referring Tyechia Allmendinger: Treating Brandan Glauber/Extender: [name] in Treatment: 6 Wound Status Wound Number: 1 Primary Pressure Ulcer Etiology: Wound Location: Right, Posterior Calcaneus Wound Status: Open Wounding Event: Pressure Injury Comorbid Cataracts, Hypertension, History of pressure wounds, Date Acquired: 01/13/2022 History: Neuropathy Weeks Of Treatment: 6 Clustered Wound: No Photos YOSSELIN, GAIER ([id number]) [id number] Page 6 of 8 Wound Measurements Length: (cm) 0.4 Width: (cm) 0.5 Depth: (cm) 0.2 Area: (cm) 0.157 Volume: (cm) 0.031 % Reduction in Area: 66.7% % Reduction in Volume: 34% Epithelialization: Small (1-33%) Wound Description Classification: Category/Stage III Wound Margin: Distinct, outline attached Exudate Amount: Small Exudate Type: Serous Exudate Color: amber Foul Odor After Cleansing: No Slough/Fibrino No Wound Bed Granulation Amount: Medium (34-66%) Exposed Structure Necrotic Amount: Medium (34-66%) Fascia Exposed: No Necrotic Quality: Adherent Slough Fat Layer (Subcutaneous Tissue) Exposed: Yes Tendon Exposed: No Muscle Exposed: No Joint Exposed: No Bone Exposed: No Electronic Signature(s) Signed: 05/09/2022 1:37:50 PM By: Midge Aver MSN RN CNS WTA Entered By: Midge Aver on 05/09/2022 09:33:20 -------------------------------------------------------------------------------- Wound Assessment Details Patient Name: Date of Service: Court Joy  [facility] 05/09/2022 12:15 PM Medical Record Number: [id number] Patient Account Number: [id number] Date of Birth/Sex: Treating RN: 09/13/1950 (72 y.o. Jennifer [name] Primary Care Jahbari Repinski: [name] Other Clinician: Referring Katesha Eichel: Treating Adylin Hankey/Extender: [name] in Treatment: 6 Wound Status Wound Number: 3 Primary Pressure Ulcer Etiology: Wound Location: Left, Medial Calcaneus Wound Status: Open Wounding Event: Blister Comorbid Cataracts, Hypertension, History of pressure wounds, Date Acquired: 05/05/2022 History: Neuropathy Weeks Of Treatment: 0 Clustered Wound: No Photos AMMI, PAVLIK ([id number]) [id number] Page 7 of 8 Wound Measurements Length: (cm) 2.5 Width: (cm) 2.5 Depth: (cm) 0.1 Area: (cm) 4.909 Volume: (cm) 0.491 % Reduction in Area: % Reduction in Volume: Epithelialization: Medium (34-66%) Tunneling: No Undermining: No Wound Description Classification: Category/Stage III Exudate Amount: Medium Exudate Type: Serosanguineous Exudate Color: red, brown Foul Odor After Cleansing: No Slough/Fibrino No Wound Bed Granulation Amount: Large (67-100%) Exposed Structure Granulation Quality: Red Fascia Exposed: No Necrotic Amount: None Present (0%) Fat Layer (Subcutaneous Tissue) Exposed: Yes Tendon Exposed: No Muscle Exposed: No Joint Exposed: No Bone Exposed: No Electronic Signature(s) Signed: 05/10/2022 1:36:32 PM By: Allen Derry PA-C Signed: 09/16/2022 2:47:14 PM By: Midge Aver MSN RN CNS WTA Previous Signature: 05/09/2022 1:37:50 PM Version By: Midge Aver MSN RN CNS WTA Entered By: Allen Derry on 05/10/2022 10:36:32 -------------------------------------------------------------------------------- Vitals Details Patient Name: Date of Service: [place]  [facility] 05/09/2022 12:15 PM Medical Record Number: [id number] Patient Account Number: [id number] Date of Birth/Sex: Treating  RN: 04-05-1950 (72 y.o. Jennifer [name] Primary Care Lindsie Simar: [name] Other Clinician: Referring Ticia Virgo: Treating Selina Tapper/Extender: [name] in Treatment: 6 Vital Signs Time Taken: 12:23 Temperature (F): 97.6 Height (in): 61 Pulse (bpm): 64 Weight (lbs): 185 Respiratory Rate (breaths/min): 18 Body Mass Index (BMI): 35 Blood Pressure (mmHg): 146/74 Reference Range: 80 - 120 mg / dl Electronic Signature(s) Signed: 05/09/2022 1:37:50 PM By: Midge Aver MSN RN CNS [address], Renea Ee ([id number]) By: Midge Aver MSN RN CNS Margarita Rana [phone].pdf Page 8 of 8 Signed: 05/09/2022 1:37:50 PM Entered By: Midge Aver on 05/09/2022 09:46:16

## 2022-05-09 NOTE — Progress Notes (Signed)
Jennifer [name], Jennifer [name] ([id number]) [id number] Page 1 of 7 Visit Report for 05/09/2022 Chief Complaint Document Details Patient Name: Date of Service: Jennifer [name] [facility] 05/09/2022 12:15 PM Medical Record Number: [id number] Patient Account Number: [id number] Date of Birth/Sex: Treating RN: [birth year] (72 y.o. Jennifer [name] Primary Care Provider: [name] Other Clinician: Referring Provider: Treating Provider/Extender: [name] in Treatment: 6 Information Obtained from: Patient Chief Complaint 03/24/2022; patient is here for review of wounds on the 2 locations on the left lateral foot Electronic Signature(s) Signed: 05/09/2022 12:40:26 PM By: Allen Derry PA-C Entered By: Allen Derry on 05/09/2022 12:40:26 -------------------------------------------------------------------------------- Debridement Details Patient Name: Date of Service: Jennifer [name] [facility] 05/09/2022 12:15 PM Medical Record Number: [id number] Patient Account Number: [id number] Date of Birth/Sex: Treating RN: [birth year] (72 y.o. Jennifer [name] Primary Care Provider: [name] Other Clinician: Referring Provider: Treating Provider/Extender: [name] in Treatment: 6 Debridement Performed for Assessment: Wound #1 Right,Posterior Calcaneus Performed By: Physician Allen Derry, PA-C Debridement Type: Debridement Level of Consciousness (Pre-procedure): Awake and Alert Pre-procedure Verification/Time Out Yes - 12:47 Taken: Start Time: 12:47 Pain Control: Lidocaine 4% T opical Solution Percent of Wound Bed Debrided: 100% T Area Debrided (cm): otal 0.16 Tissue and other material debrided: Viable, Non-Viable, Callus, Slough, Subcutaneous, Slough Level: Skin/Subcutaneous Tissue Debridement Description: Excisional Instrument: Curette Bleeding: Minimum Hemostasis Achieved: Pressure Procedural Pain: 0 Post Procedural Pain:  0 Response to Treatment: Procedure was tolerated well Level of Consciousness (Post- Awake and Alert procedure): Post Debridement Measurements of Total Wound Length: (cm) 0.4 Stage: Category/Stage III Width: (cm) 0.5 Depth: (cm) 0.3 Volume: (cm) 0.047 Character of Wound/Ulcer Post Debridement: Stable Post Procedure Diagnosis Same as Pre-procedure Electronic Signature(s) Signed: 05/09/2022 1:37:50 PM By: Midge Aver MSN RN CNS WTA Signed: 05/10/2022 11:50:37 AM By: Allen Derry PA-C Entered By: Midge Aver on 05/09/2022 12:49:10 Jennifer [name] ([id number]) [id number] Page 2 of 7 -------------------------------------------------------------------------------- Debridement Details Patient Name: Date of Service: Jennifer [name] 05/09/2022 12:15 PM Medical Record Number: [id number] Patient Account Number: [id number] Date of Birth/Sex: Treating RN: 06-04-1950 (72 y.o. Jennifer [name] Primary Care Provider: [name] Other Clinician: Referring Provider: Treating Provider/Extender: [name] in Treatment: 6 Debridement Performed for Assessment: Wound #3 Left,Medial Calcaneus Performed By: Physician Allen Derry, PA-C Debridement Type: Debridement Level of Consciousness (Pre-procedure): Awake and Alert Pre-procedure Verification/Time Out Yes - 12:47 Taken: Start Time: 12:47 Pain Control: Lidocaine 4% T opical Solution Percent of Wound Bed Debrided: 100% T Area Debrided (cm): otal 4.91 Tissue and other material debrided: Viable, Non-Viable, Skin: Dermis , Skin: Epidermis Level: Skin/Epidermis Debridement Description: Selective/Open Wound Instrument: Curette Bleeding: Minimum Hemostasis Achieved: Pressure Procedural Pain: 0 Post Procedural Pain: 0 Response to Treatment: Procedure was tolerated well Level of Consciousness (Post- Awake and Alert procedure): Post Debridement Measurements of Total Wound Length:  (cm) 2.5 Stage: Category/Stage I Width: (cm) 2.5 Depth: (cm) 0.3 Volume: (cm) 1.473 Character of Wound/Ulcer Post Debridement: Stable Post Procedure Diagnosis Same as Pre-procedure Electronic Signature(s) Signed: 05/09/2022 1:37:50 PM By: Midge Aver MSN RN CNS WTA Signed: 05/10/2022 11:50:37 AM By: Allen Derry PA-C Entered By: Midge Aver on 05/09/2022 12:52:03 -------------------------------------------------------------------------------- HPI Details Patient Name: Date of Service: Jennifer [name] 05/09/2022 12:15 PM Medical Record Number: [id number] Patient Account Number: [id number] Date of Birth/Sex: Treating RN: 13-Nov-1950 (72 y.o. Jennifer [name] Primary Care Provider: [name] Other Clinician: Referring Provider: Treating Provider/Extender: [name] in Treatment: 6 History of Present Illness  HPI Description: ADMISSION 03/24/2022 This is a 72 year old woman who has had 2 small open areas on the right lateral foot. She is not a diabetic but does have peripheral neuropathy. She was seen by [facility] yesterday. ABIs w She has not had previous wounds in this area. ABI 1.20 on the right and TBI at 1.02. On the left 1.23 and 1.00 on the left Past medical history; is not noted not a diabetic. She has gastroesophageal reflux disease, high cholesterol, hyperlipidemia, hypertension, hypothyroidism, sleep apnea 04-07-2022 upon evaluation today patient appears to be doing well currently in regard to her wound. The lateral portion of her foot appears to be completely healed which is great news and I am very pleased in that regard. In regard to the medial portion of her foot and the heel location this is still open and does appear to need some sharp debridement today. I discussed with her today that we are going to proceed with debridement at this point. She is in agreement with that plan. Jennifer [name], Jennifer [name] ([id number])  [id number] Page 3 of 7 04-14-2022 upon evaluation today patient appears to be doing well currently in regard to her wound. She has been tolerating the dressing changes without complication and fortunately I do not see any signs of active infection locally nor systemically at this time. She did have some area of dark discoloration where I used a silver nitrate last week because of this she felt like that the [facility] was doing worse for her and she quit using it just using Neosporin. With that being said right now I think she really needs to get back to using the [facility] and she needs to be using offloading shoe. 04-22-2022 upon evaluation today patient appears to be doing well currently in regard to the wounds on her heel which are showing signs of improvement and the actual wound is measuring smaller she does have some irritation around the wound, cleaned away some of this callus today. Fortunately I do not see any evidence of active infection locally nor systemically which is great news. 04-29-2022 upon evaluation today patient appears to be doing well currently in regard to her wound. She is measuring about the same but nonetheless is still showing signs of improvement there is a lot of the discoloration around the edge which is improving little by little as we debride this away as well. Fortunately I am very pleased with where things stand. 05-09-2022 upon evaluation today patient appears to be doing well currently in regard to her wound. This is actually doing well on the right heel unfortunately she has a new blister on the left heel she does not know when that occurred exactly and what happened. Fortunately there does not appear to be any signs of active infection locally nor systemically which is great news. Nonetheless this blister is going to need to be removed so we get this feeling of movement in the right direction. Electronic Signature(s) Signed:  05/09/2022 1:02:46 PM By: Allen Derry PA-C Entered By: Allen Derry on 05/09/2022 13:02:46 -------------------------------------------------------------------------------- Physical Exam Details Patient Name: Date of Service: Jennifer [name] 05/09/2022 12:15 PM Medical Record Number: [id number] Patient Account Number: [id number] Date of Birth/Sex: Treating RN: May 22, 1950 (72 y.o. Jennifer [name] Primary Care Provider: [name] Other Clinician: Referring Provider: Treating Provider/Extender: [name] in Treatment: 6 Constitutional Obese and well-hydrated in no acute distress. Respiratory normal breathing without difficulty. Psychiatric this patient is able to make decisions  and demonstrates good insight into disease process. Alert and Oriented x 3. pleasant and cooperative. Notes Upon inspection patient's wound bed actually showed signs of good granulation epithelization at this point. Fortunately there does not appear to be any signs of active infection locally nor systemically at this time which is great news and overall I am extremely pleased with where we stand currently. Electronic Signature(s) Signed: 05/09/2022 1:03:08 PM By: Allen Derry PA-C Entered By: Allen Derry on 05/09/2022 13:03:08 -------------------------------------------------------------------------------- Physician Orders Details Patient Name: Date of Service: Jennifer [name] 05/09/2022 12:15 PM Medical Record Number: [id number] Patient Account Number: [id number] Date of Birth/Sex: Treating RN: 1950/11/22 (72 y.o. Jennifer [name] Primary Care Provider: [name] Other Clinician: Referring Provider: Treating Provider/Extender: [name] in Treatment: 6 Verbal / Phone Orders: No Diagnosis Coding ICD-10 Coding Code Description G90.09 Other idiopathic peripheral autonomic neuropathy L89.613 Pressure ulcer of right heel, stage 3 L89.623  Pressure ulcer of left heel, stage 3 Follow-up Appointments Return Appointment in 1 week. Jennifer [name], Jennifer [name] ([id number]) [id number] Page 4 of 7 Bathing/ [employer] wounds with antibacterial soap and water. Off-Loading Heel suspension boot - not wearing Wound Treatment Wound #1 - Calcaneus Wound Laterality: Right, Posterior Cleanser: Soap and Water 3 x Per Week/30 Days Discharge Instructions: Gently cleanse wound with antibacterial soap, rinse and pat dry prior to dressing wounds Prim Dressing: Hydrofera Blue Ready Transfer Foam, 2.5x2.5 (in/in) (Generic) 3 x Per Week/30 Days ary Discharge Instructions: Apply Hydrofera Blue Ready to wound bed as directed Secondary Dressing: Gauze 3 x Per Week/30 Days Discharge Instructions: As directed: dry, moistened with saline or moistened with Dakins Solution Secured With: [employer] Sterile or Non-Sterile 6-ply 4.5x4 (yd/yd) 3 x Per Week/30 Days Discharge Instructions: Apply Kerlix as directed Wound #3 - Calcaneus Wound Laterality: Left, Medial Cleanser: Soap and Water 3 x Per Week/30 Days Discharge Instructions: Gently cleanse wound with antibacterial soap, rinse and pat dry prior to dressing wounds Prim Dressing: Hydrofera Blue Ready Transfer Foam, 2.5x2.5 (in/in) (DME) (Dispense As Written) 3 x Per Week/30 Days ary Discharge Instructions: Apply Hydrofera Blue Ready to wound bed as directed Secondary Dressing: Gauze (DME) (Generic) 3 x Per Week/30 Days Discharge Instructions: As directed: dry, moistened with saline or moistened with Dakins Solution Secured With: [employer] Sterile or Non-Sterile 6-ply 4.5x4 (yd/yd) (DME) (Generic) 3 x Per Week/30 Days Discharge Instructions: Apply Kerlix as directed Electronic Signature(s) Signed: 05/10/2022 4:59:06 PM By: Allen Derry PA-C Previous Signature: 05/09/2022 1:37:50 PM Version By: Midge Aver MSN RN CNS WTA Previous Signature: 05/10/2022 11:50:37 AM Version By:  Allen Derry PA-C Entered By: Allen Derry on 05/10/2022 13:38:41 -------------------------------------------------------------------------------- Problem List Details Patient Name: Date of Service: Jennifer [name] 05/09/2022 12:15 PM Medical Record Number: [id number] Patient Account Number: [id number] Date of Birth/Sex: Treating RN: 26-Oct-1950 (72 y.o. Jennifer [name] Primary Care Provider: [name] Other Clinician: Referring Provider: Treating Provider/Extender: [name] in Treatment: 6 Active Problems ICD-10 Encounter Code Description Active Date MDM Diagnosis G90.09 Other idiopathic peripheral autonomic neuropathy 03/24/2022 No Yes L89.613 Pressure ulcer of right heel, stage 3 03/24/2022 No Yes L89.623 Pressure ulcer of left heel, stage 3 05/09/2022 No Yes Inactive Problems Resolved Problems Jennifer [name], Jennifer [name] ([id number]) [id number] Page 5 of 7 Electronic Signature(s) Signed: 05/10/2022 1:37:31 PM By: Allen Derry PA-C Previous Signature: 05/09/2022 1:04:21 PM Version By: Allen Derry PA-C Previous Signature: 05/09/2022 12:40:22 PM Version By: Allen Derry PA-C Entered By: Allen Derry on 05/10/2022 13:37:31 --------------------------------------------------------------------------------  Progress Note Details Patient Name: Date of Service: Jennifer [name] 05/09/2022 12:15 PM Medical Record Number: [id number] Patient Account Number: [id number] Date of Birth/Sex: Treating RN: 1950-11-24 (72 y.o. Jennifer [name] Primary Care Provider: [name] Other Clinician: Referring Provider: Treating Provider/Extender: [name] in Treatment: 6 Subjective Chief Complaint Information obtained from Patient 03/24/2022; patient is here for review of wounds on the 2 locations on the left lateral foot History of Present Illness (HPI) ADMISSION 03/24/2022 This is a 72 year old woman who has had 2 small  open areas on the right lateral foot. She is not a diabetic but does have peripheral neuropathy. She was seen by [facility] yesterday. ABIs w She has not had previous wounds in this area. ABI 1.20 on the right and TBI at 1.02. On the left 1.23 and 1.00 on the left Past medical history; is not noted not a diabetic. She has gastroesophageal reflux disease, high cholesterol, hyperlipidemia, hypertension, hypothyroidism, sleep apnea 04-07-2022 upon evaluation today patient appears to be doing well currently in regard to her wound. The lateral portion of her foot appears to be completely healed which is great news and I am very pleased in that regard. In regard to the medial portion of her foot and the heel location this is still open and does appear to need some sharp debridement today. I discussed with her today that we are going to proceed with debridement at this point. She is in agreement with that plan. 04-14-2022 upon evaluation today patient appears to be doing well currently in regard to her wound. She has been tolerating the dressing changes without complication and fortunately I do not see any signs of active infection locally nor systemically at this time. She did have some area of dark discoloration where I used a silver nitrate last week because of this she felt like that the [facility] was doing worse for her and she quit using it just using Neosporin. With that being said right now I think she really needs to get back to using the [facility] and she needs to be using offloading shoe. 04-22-2022 upon evaluation today patient appears to be doing well currently in regard to the wounds on her heel which are showing signs of improvement and the actual wound is measuring smaller she does have some irritation around the wound, cleaned away some of this callus today. Fortunately I do not see any evidence of active infection locally nor systemically which is great  news. 04-29-2022 upon evaluation today patient appears to be doing well currently in regard to her wound. She is measuring about the same but nonetheless is still showing signs of improvement there is a lot of the discoloration around the edge which is improving little by little as we debride this away as well. Fortunately I am very pleased with where things stand. 05-09-2022 upon evaluation today patient appears to be doing well currently in regard to her wound. This is actually doing well on the right heel unfortunately she has a new blister on the left heel she does not know when that occurred exactly and what happened. Fortunately there does not appear to be any signs of active infection locally nor systemically which is great news. Nonetheless this blister is going to need to be removed so we get this feeling of movement in the right direction. Objective Constitutional Obese and well-hydrated in no acute distress. Vitals Time Taken: 12:23 PM, Height: 61 in, Weight: 185 lbs,  BMI: 35, Temperature: 97.6 F, Pulse: 64 bpm, Respiratory Rate: 18 breaths/min, Blood Pressure: 146/74 mmHg. Respiratory normal breathing without difficulty. Psychiatric this patient is able to make decisions and demonstrates good insight into disease process. Alert and Oriented x 3. pleasant and cooperative. Jennifer [name], Jennifer [name] ([id number]) [id number] Page 6 of 7 General Notes: Upon inspection patient's wound bed actually showed signs of good granulation epithelization at this point. Fortunately there does not appear to be any signs of active infection locally nor systemically at this time which is great news and overall I am extremely pleased with where we stand currently. Integumentary (Hair, Skin) Wound #1 status is Open. Original cause of wound was Pressure Injury. The date acquired was: 01/13/2022. The wound has been in treatment 6 weeks. The wound is located on the Right,Posterior  Calcaneus. The wound measures 0.4cm length x 0.5cm width x 0.2cm depth; 0.157cm^2 area and 0.031cm^3 volume. There is Fat Layer (Subcutaneous Tissue) exposed. There is a small amount of serous drainage noted. The wound margin is distinct with the outline attached to the wound base. There is medium (34-66%) granulation within the wound bed. There is a medium (34-66%) amount of necrotic tissue within the wound bed including Adherent Slough. Wound #3 status is Open. Original cause of wound was Blister. The date acquired was: 05/05/2022. The wound is located on the Left,Medial Calcaneus. The wound measures 2.5cm length x 2.5cm width x 0.1cm depth; 4.909cm^2 area and 0.491cm^3 volume. There is Fat Layer (Subcutaneous Tissue) exposed. There is no tunneling or undermining noted. There is a medium amount of serosanguineous drainage noted. There is large (67-100%) red granulation within the wound bed. There is no necrotic tissue within the wound bed. Assessment Active Problems ICD-10 Other idiopathic peripheral autonomic neuropathy Pressure ulcer of right heel, stage 3 Pressure ulcer of left heel, stage 3 Procedures Wound #1 Pre-procedure diagnosis of Wound #1 is a Pressure Ulcer located on the Right,Posterior Calcaneus . There was a Excisional Skin/Subcutaneous Tissue Debridement with a total area of 0.16 sq cm performed by Allen Derry, PA-C. With the following instrument(s): Curette to remove Viable and Non-Viable tissue/material. Material removed includes Callus, Subcutaneous Tissue, and Slough after achieving pain control using Lidocaine 4% T opical Solution. No specimens were taken. A time out was conducted at 12:47, prior to the start of the procedure. A Minimum amount of bleeding was controlled with Pressure. The procedure was tolerated well with a pain level of 0 throughout and a pain level of 0 following the procedure. Post Debridement Measurements: 0.4cm length x 0.5cm width x 0.3cm depth;  0.047cm^3 volume. Post debridement Stage noted as Category/Stage III. Character of Wound/Ulcer Post Debridement is stable. Post procedure Diagnosis Wound #1: Same as Pre-Procedure Wound #3 Pre-procedure diagnosis of Wound #3 is a Pressure Ulcer located on the Left,Medial Calcaneus . There was a Selective/Open Wound Skin/Epidermis Debridement with a total area of 4.91 sq cm performed by Allen Derry, PA-C. With the following instrument(s): Curette to remove Viable and Non-Viable tissue/material. Material removed includes Skin: Dermis and Skin: Epidermis and after achieving pain control using Lidocaine 4% T opical Solution. No specimens were taken. A time out was conducted at 12:47, prior to the start of the procedure. A Minimum amount of bleeding was controlled with Pressure. The procedure was tolerated well with a pain level of 0 throughout and a pain level of 0 following the procedure. Post Debridement Measurements: 2.5cm length x 2.5cm width x 0.3cm depth; 1.473cm^3 volume. Post debridement Stage noted as Category/Stage I.  Character of Wound/Ulcer Post Debridement is stable. Post procedure Diagnosis Wound #3: Same as Pre-Procedure Plan Follow-up Appointments: Return Appointment in 1 week. Bathing/ Shower/ Hygiene: Wash wounds with antibacterial soap and water. Off-Loading: Heel suspension boot - not wearing WOUND #1: - Calcaneus Wound Laterality: Right, Posterior Cleanser: Soap and Water 3 x Per Week/30 Days Discharge Instructions: Gently cleanse wound with antibacterial soap, rinse and pat dry prior to dressing wounds Prim Dressing: Hydrofera Blue Ready Transfer Foam, 2.5x2.5 (in/in) (Generic) 3 x Per Week/30 Days ary Discharge Instructions: Apply Hydrofera Blue Ready to wound bed as directed Secondary Dressing: Gauze 3 x Per Week/30 Days Discharge Instructions: As directed: dry, moistened with saline or moistened with Dakins Solution Secured With: [employer] Sterile or Non-Sterile  6-ply 4.5x4 (yd/yd) 3 x Per Week/30 Days Discharge Instructions: Apply Kerlix as directed WOUND #3: - Calcaneus Wound Laterality: Left, Medial Cleanser: Soap and Water 3 x Per Week/30 Days Discharge Instructions: Gently cleanse wound with antibacterial soap, rinse and pat dry prior to dressing wounds Prim Dressing: Hydrofera Blue Ready Transfer Foam, 2.5x2.5 (in/in) (DME) (Dispense As Written) 3 x Per Week/30 Days ary Discharge Instructions: Apply Hydrofera Blue Ready to wound bed as directed Secondary Dressing: Gauze (DME) (Generic) 3 x Per Week/30 Days Discharge Instructions: As directed: dry, moistened with saline or moistened with Dakins Solution Secured With: [employer] Sterile or Non-Sterile 6-ply 4.5x4 (yd/yd) (DME) (Generic) 3 x Per Week/30 Days Discharge Instructions: Apply Kerlix as directed Jennifer [name], Jennifer [name] ([id number]) [id number] Page 7 of 7 1. I am good recommend that we have the patient continue to monitor for any signs of infection or worsening. Based on what I am seeing I do believe that we are moving in the right direction. 2. I am also going to suggest that the patient should continue to utilize the [facility] and I do this on the right should also be doing this on the left and I think that both should actually hopefully do quite well. We will see patient back for reevaluation in 1 week here in the clinic. If anything worsens or changes patient will contact our office for additional recommendations. Electronic Signature(s) Signed: 05/10/2022 1:37:48 PM By: Allen Derry PA-C Previous Signature: 05/09/2022 1:04:32 PM Version By: Allen Derry PA-C Previous Signature: 05/09/2022 1:03:36 PM Version By: Allen Derry PA-C Entered By: Allen Derry on 05/10/2022 13:37:47 -------------------------------------------------------------------------------- SuperBill Details Patient Name: Date of Service: Jennifer [name] 05/09/2022 Medical Record Number:  [id number] Patient Account Number: [id number] Date of Birth/Sex: Treating RN: [birth year] (72 y.o. Jennifer [name] Primary Care Provider: [name] Other Clinician: Referring Provider: Treating Provider/Extender: [name] in Treatment: 6 Diagnosis Coding ICD-10 Codes Code Description G90.09 Other idiopathic peripheral autonomic neuropathy L89.613 Pressure ulcer of right heel, stage 3 L89.623 Pressure ulcer of left heel, stage 3 Facility Procedures : CPT4 Code: [id number] Description: 11042 - DEB SUBQ TISSUE 20 SQ CM/< ICD-10 Diagnosis Description L89.613 Pressure ulcer of right heel, stage 3 Modifier: Quantity: 1 : CPT4 Code: [id number] Description: 97597 - DEBRIDE WOUND 1ST 20 SQ CM OR < ICD-10 Diagnosis Description L89.623 Pressure ulcer of left heel, stage 3 Modifier: Quantity: 1 Physician Procedures : CPT4 Code Description Modifier [id number] 11042 - WC PHYS SUBQ TISS 20 SQ CM ICD-10 Diagnosis Description L89.613 Pressure ulcer of right heel, stage 3 Quantity: 1 : [id number] 97597 - WC PHYS DEBR WO ANESTH 20 SQ CM ICD-10 Diagnosis Description L89.623 Pressure ulcer of left heel, stage 3 Quantity: 1 Electronic Signature(s) Signed:  05/10/2022 1:38:21 PM By: Allen Derry PA-C Previous Signature: 05/09/2022 1:04:53 PM Version By: Allen Derry PA-C Entered By: Allen Derry on 05/10/2022 13:38:20

## 2022-05-09 NOTE — Transitions of Care (Post Inpatient/ED Visit) (Signed)
   05/09/2022  Name: Jennifer [name] MRN: [id number] DOB: July 02, 1950  Today's TOC FU Call Status: Today's TOC FU Call Status:: Unsuccessful Call (2nd Attempt) Unsuccessful Call (2nd Attempt) Date: 05/09/22  Attempted to reach the patient regarding the most recent Inpatient/ED visit.  Follow Up Plan: Additional outreach attempts will be made to reach the patient to complete the Transitions of Care (Post Inpatient/ED visit) call.   Gean Maidens BSN RN [facility] [phone]

## 2022-05-09 NOTE — Telephone Encounter (Signed)
LVM to call back to schedule [facility]

## 2022-05-09 NOTE — Progress Notes (Deleted)
Wyline Mood MD, MRCP(U.K) [address]  [address]  [place], [place] [address]  Main: [phone]  Fax: [phone]   Primary Care Physician: Allegra Grana, FNP  Primary Gastroenterologist:  Dr. Wyline Mood   No chief complaint on file.   HPI: Jennifer [name] is a 72 y.o. female Summary of history :   History of rectal bleeding admitted to the hospital in January 2023, colonoscopy in 2021 showed hemorrhoids polyps were resected.  History of gastric sleeve surgery and duodenal switch.  EGD in November 2022 showed postsurgical changes but no strictures.  She has undergone banding of hemorrhoids first round in January 2020 3-second round in February 2023 in June 2023 saw Dr. Allegra Lai after hospitalization in May 2023 with severe acute blood loss anemia.  At her last visit with Dr. Allegra Lai in June 2023 she was having rectal bleeding with every bowel movement.  Video capsule study in May 2023 shows no active bleeding.  She had her last column of hemorrhoids banded Urgent visit 09/07/2021 for discomfort in the perianal area , also felt  that the food is stuck in the upper part of her throat    09/16/2021: Barium swallow with tablet: Mild GERD, moderate esophageal dysmotility      Interval history  09/23/2021-12/14/2021   The last visit she is doing well overall some itching in the perianal area at times feels abnormal sensation in the perianal area Current Outpatient Medications  Medication Sig Dispense Refill   acetaminophen (TYLENOL) 325 MG tablet Take 2 tablets (650 mg total) by mouth every 6 (six) hours as needed for mild pain (or Fever >/= 101).     acyclovir (ZOVIRAX) 400 MG tablet Take 1 tablet (400 mg total) by mouth daily. 30 tablet 5   fluticasone (FLONASE) 50 MCG/ACT nasal spray SPRAY 1 SPRAY INTO BOTH NOSTRILS DAILY. 16 mL 2   folic acid (FOLVITE) 1 MG tablet Take 1 tablet (1 mg total) by mouth daily. 360 tablet 0   hydrocortisone (ANUSOL-HC) 25 MG suppository Unwrap and  insert 1 suppository rectally twice a day 12 suppository 1   levothyroxine (SYNTHROID) 50 MCG tablet TAKE 1 TABLET (50 MCG TOTAL) BY MOUTH DAILY. DX CODE E03.9 90 tablet 1   losartan (COZAAR) 100 MG tablet TAKE 1 TABLET BY MOUTH EVERYDAY AT BEDTIME (Patient taking differently: Take 100 mg by mouth daily. TAKE 1 TABLET BY MOUTH EVERYDAY AT BEDTIME) 90 tablet 3   Multiple Vitamin (MULTIVITAMIN WITH MINERALS) TABS tablet Take 1 tablet by mouth daily.     pantoprazole (PROTONIX) 40 MG tablet Take 1 tablet (40 mg total) by mouth 2 (two) times daily. 180 tablet 3   sodium chloride (OCEAN) 0.65 % SOLN nasal spray Place 2 sprays into both nostrils as needed for congestion. 30 mL 2   thiamine (VITAMIN B1) 100 MG tablet Take 1 tablet (100 mg total) by mouth daily. 30 tablet 0   traZODone (DESYREL) 50 MG tablet for sleep DX Code G47.00 (Patient taking differently: Take 50 mg by mouth at bedtime. for sleep DX Code G47.00) 90 tablet 3   No current facility-administered medications for this visit.    Allergies as of 05/09/2022 - Review Complete 05/05/2022  Allergen Reaction Noted   Bee pollen Itching 11/17/2021   Celecoxib Hives 07/25/2016   Hydrocodone Nausea Only 06/02/2020   Pollen extract Itching    Sodium ferric gluconate [ferrous gluconate]  11/20/2020   Nasacort [triamcinolone] Other (See Comments) 12/07/2015   Vicodin [hydrocodone-acetaminophen] Nausea  Only 09/03/2014    ROS:  General: Negative for anorexia, weight loss, fever, chills, fatigue, weakness. ENT: Negative for hoarseness, difficulty swallowing , nasal congestion. CV: Negative for chest pain, angina, palpitations, dyspnea on exertion, peripheral edema.  Respiratory: Negative for dyspnea at rest, dyspnea on exertion, cough, sputum, wheezing.  GI: See history of present illness. GU:  Negative for dysuria, hematuria, urinary incontinence, urinary frequency, nocturnal urination.  Endo: Negative for unusual weight change.    Physical  Examination:   There were no vitals taken for this visit.  General: Well-nourished, well-developed in no acute distress.  Eyes: No icterus. Conjunctivae pink. Mouth: Oropharyngeal mucosa moist and pink , no lesions erythema or exudate. Lungs: Clear to auscultation bilaterally. Non-labored. Heart: Regular rate and rhythm, no murmurs rubs or gallops.  Abdomen: Bowel sounds are normal, nontender, nondistended, no hepatosplenomegaly or masses, no abdominal bruits or hernia , no rebound or guarding.   Extremities: No lower extremity edema. No clubbing or deformities. Neuro: Alert and oriented x 3.  Grossly intact. Skin: Warm and dry, no jaundice.   Psych: Alert and cooperative, normal mood and affect.   Imaging Studies: CT ANGIO GI BLEED  Result Date: 05/03/2022 CLINICAL DATA:  Melena, anemia, presyncope EXAM: CTA ABDOMEN AND PELVIS WITHOUT AND WITH CONTRAST TECHNIQUE: Multidetector CT imaging of the abdomen and pelvis was performed using the standard protocol during bolus administration of intravenous contrast. Multiplanar reconstructed images and MIPs were obtained and reviewed to evaluate the vascular anatomy. RADIATION DOSE REDUCTION: This exam was performed according to the departmental dose-optimization program which includes automated exposure control, adjustment of the mA and/or kV according to patient size and/or use of iterative reconstruction technique. CONTRAST:  OMNIPAQUE IOHEXOL 350 MG/ML SOLN COMPARISON:  06/06/2021 FINDINGS: VASCULAR Aorta: Moderate atherosclerotic calcification. No hemodynamically significant stenosis. No aneurysm or dissection. No periaortic inflammatory change or fluid collection. Celiac: Patent without evidence of aneurysm, dissection, vasculitis or significant stenosis. SMA: Patent without evidence of aneurysm, dissection, vasculitis or significant stenosis. Renals: Single right and dual left renal arteries are widely patent and demonstrate normal vascular  morphology. No aneurysm or dissection. IMA: Patent without evidence of aneurysm, dissection, vasculitis or significant stenosis. Inflow: Patent without evidence of aneurysm, dissection, vasculitis or significant stenosis. Internal iliac arteries are patent bilaterally. Proximal Outflow: Bilateral common femoral and visualized portions of the superficial and profunda femoral arteries are patent without evidence of aneurysm, dissection, vasculitis or significant stenosis. Veins: The venous vasculature of the abdomen and pelvis is unremarkable. Specifically, the inferior and superior mesenteric veins, splenic vein and portal veins are patent. Review of the MIP images confirms the above findings. NON-VASCULAR Lower chest: No acute abnormality. Hepatobiliary: Severe hepatic steatosis. No enhancing intrahepatic mass. No intra or extrahepatic biliary ductal dilation. Status post cholecystectomy. Pancreas: Unremarkable Spleen: Unremarkable Adrenals/Urinary Tract: Adrenal glands are unremarkable. Kidneys are normal, without renal calculi, focal lesion, or hydronephrosis. Bladder is unremarkable. Stomach/Bowel: No active gastrointestinal hemorrhage identified. Surgical changes of gastric sleeve resection, distal gastrectomy, and Roux-en-Y gastrojejunostomy are identified. No evidence of obstruction or focal inflammation. The stomach, small bowel, and large bowel are otherwise unremarkable. Appendix normal. No free intraperitoneal gas or fluid. Lymphatic: No pathologic adenopathy within the abdomen and pelvis. Reproductive: Status post hysterectomy. No adnexal masses. Other: No abdominal wall hernia or abnormality. No abdominopelvic ascites. Musculoskeletal: Degenerative changes are seen within the lumbar spine. No acute bone abnormality. No lytic or blastic bone lesion. IMPRESSION: 1. No active gastrointestinal hemorrhage identified. No acute intra-abdominal pathology identified.  2. Severe hepatic steatosis. 3. Surgical  changes of gastric sleeve resection, distal gastrectomy, and Roux-en-Y gastrojejunostomy. No evidence of obstruction or focal inflammation. Aortic Atherosclerosis (ICD10-I70.0). Electronically Signed   By: Helyn Numbers M.D.   On: 05/03/2022 18:34   DG Chest 2 View  Result Date: 05/03/2022 CLINICAL DATA:  Shortness of breath EXAM: CHEST - 2 VIEW COMPARISON:  11/17/2021 FINDINGS: The heart size and mediastinal contours are within normal limits. Both lungs are clear. The visualized skeletal structures are unremarkable. A thin linear radiodensity projects over the left shoulder on the frontal view and is presumably external to the patient. IMPRESSION: No active cardiopulmonary disease. Electronically Signed   By: Duanne Guess D.O.   On: 05/03/2022 15:22   CT Head Wo Contrast  Result Date: 05/03/2022 CLINICAL DATA:  Vertigo EXAM: CT HEAD WITHOUT CONTRAST TECHNIQUE: Contiguous axial images were obtained from the base of the skull through the vertex without intravenous contrast. RADIATION DOSE REDUCTION: This exam was performed according to the departmental dose-optimization program which includes automated exposure control, adjustment of the mA and/or kV according to patient size and/or use of iterative reconstruction technique. COMPARISON:  CT head 09/27/21 FINDINGS: Brain: No evidence of acute infarction, hemorrhage, hydrocephalus, extra-axial collection or mass lesion/mass effect. Vascular: No hyperdense vessel or unexpected calcification. Skull: Normal. Negative for fracture or focal lesion. Sinuses/Orbits: No middle ear or mastoid effusion. Paranasal sinuses are clear. Bilateral lens replacement. Orbits otherwise unremarkable. Other: None. IMPRESSION: No acute intracranial abnormality. Electronically Signed   By: Lorenza Cambridge M.D.   On: 05/03/2022 14:33    Assessment and Plan:   Nevae Pinnix is a 72 y.o. y/o female here to follow-up for internal hemorrhoids and prior history of anal fissure.   Presently she is doing reasonably well some discomfort in the perianal itching likely related to skin tags and retention of fecal material causing the discomfort we talked about perianal hygiene including the use of a bidet to wash and gently pat dry and use of a barrier ointment such as Desitin.  No hemorrhoidal banding needed at this point     Dr Wyline Mood  MD,MRCP [facility]) Follow up in ***  BP check ***

## 2022-05-10 ENCOUNTER — Telehealth: Payer: Self-pay | Admitting: *Deleted

## 2022-05-10 ENCOUNTER — Ambulatory Visit: Payer: Medicare Other | Admitting: Physician Assistant

## 2022-05-10 NOTE — Transitions of Care (Post Inpatient/ED Visit) (Signed)
   05/10/2022  Name: Jennifer [name] MRN: [id number] DOB: 09-17-1950  Today's TOC FU Call Status: Today's TOC FU Call Status:: Unsuccessful Call (3rd Attempt) Unsuccessful Call (3rd Attempt) Date: 05/10/22  Attempted to reach the patient regarding the most recent Inpatient/ED visit.  Follow Up Plan: No further outreach attempts will be made at this time. We have been unable to contact the patient.  Gean Maidens BSN RN [facility] [phone]

## 2022-05-11 ENCOUNTER — Ambulatory Visit: Payer: Medicare Other

## 2022-05-16 ENCOUNTER — Telehealth: Payer: Self-pay | Admitting: Family

## 2022-05-16 ENCOUNTER — Encounter: Payer: Medicare Other | Admitting: Physician Assistant

## 2022-05-16 DIAGNOSIS — L89613 Pressure ulcer of right heel, stage 3: Secondary | ICD-10-CM | POA: Diagnosis not present

## 2022-05-16 DIAGNOSIS — E7800 Pure hypercholesterolemia, unspecified: Secondary | ICD-10-CM | POA: Diagnosis not present

## 2022-05-16 DIAGNOSIS — G629 Polyneuropathy, unspecified: Secondary | ICD-10-CM | POA: Diagnosis not present

## 2022-05-16 DIAGNOSIS — L89623 Pressure ulcer of left heel, stage 3: Secondary | ICD-10-CM | POA: Diagnosis not present

## 2022-05-16 DIAGNOSIS — L97528 Non-pressure chronic ulcer of other part of left foot with other specified severity: Secondary | ICD-10-CM | POA: Diagnosis not present

## 2022-05-16 DIAGNOSIS — G9009 Other idiopathic peripheral autonomic neuropathy: Secondary | ICD-10-CM | POA: Diagnosis not present

## 2022-05-16 NOTE — Telephone Encounter (Signed)
Pt would like an referral to GI [facility] because she is seeing blood in her stool

## 2022-05-16 NOTE — Progress Notes (Signed)
Jennifer [name], Jennifer [name] ([id number]) [id number] Page 1 of 9 Visit Report for 05/16/2022 Arrival Information Details Patient Name: Date of Service: Jennifer [name] 05/16/2022 2:45 PM Medical Record Number: [id number] Patient Account Number: [id number] Date of Birth/Sex: Treating RN: 01/25/1950 (72 y.o. Jennifer [name] Primary Care Jennifer [name]: Jennifer [name] Other Clinician: Betha [name] Referring Jennifer [name] Jennifer [name]: Treating Jennifer [name]/Extender: Jennifer [name] in Treatment: 7 Visit Information History Since Last Visit All ordered tests and consults were completed: No Patient Arrived: Jennifer [name] Added or deleted any medications: No Arrival Time: 14:57 Any new allergies or adverse reactions: No Transfer Assistance: None Had a fall or experienced change in No Patient Identification Verified: Yes activities of daily living that may affect Secondary Verification Process Completed: Yes risk of falls: Patient Requires Transmission-Based Precautions: No Signs or symptoms of abuse/neglect since last visito No Patient Has Alerts: Yes Hospitalized since last visit: No Patient Alerts: Not diabetic Implantable device outside of the clinic excluding No R ABI 1.20 TBI 1.02 cellular tissue based products placed in the center L ABI 1.26 TBI 1.00 since last visit: Has Dressing in Place as Prescribed: Yes Has Footwear/Offloading in Place as Prescribed: Yes Left: Wedge Shoe Pain Present Now: Yes Electronic Signature(s) Unsigned Entered By: Jennifer [name] on 05/16/2022 15:01:35 -------------------------------------------------------------------------------- Clinic Level of Care Assessment Details Patient Name: Date of Service: Jennifer [name] 05/16/2022 2:45 PM Medical Record Number: [id number] Patient Account Number: [id number] Date of Birth/Sex: Treating RN: 04/15/1950 (72 y.o. Jennifer [name] Primary Care Dorthie Santini: Jennifer [name] Other Clinician: Betha [name] Referring Jennifer [name]: Treating Jennifer [name]/Extender: Jennifer [name] in Treatment: 7 Clinic Level of Care Assessment Items TOOL 1 Quantity Score []  - 0 Use when EandM and Procedure is performed on INITIAL visit ASSESSMENTS - Nursing Assessment / Reassessment []  - 0 General Physical Exam (combine w/ comprehensive assessment (listed just below) when performed on new pt. evals) []  - 0 Comprehensive Assessment (HX, ROS, Risk Assessments, Wounds Hx, etc.) ASSESSMENTS - Wound and Skin Assessment / Reassessment []  - 0 Dermatologic / Skin Assessment (not related to wound area) ASSESSMENTS - Ostomy and/or Continence Assessment and Care []  - 0 Incontinence Assessment and Management []  - 0 Ostomy Care Assessment and Management (repouching, etc.) PROCESS - Coordination of Care []  - 0 Simple Patient / Family Education for ongoing care []  - 0 Complex (extensive) Patient / Family Education for ongoing care []  - 0 Staff obtains Chiropractor, Records, T Results / Process Orders est []  - 0 Staff telephones HHA, Nursing Homes / Clarify orders / etc [place], Jennifer [name] ([id number]) [id number] Page 2 of 9 []  - 0 Routine Transfer to another Facility (non-emergent condition) []  - 0 Routine Hospital Admission (non-emergent condition) []  - 0 New Admissions / Manufacturing engineer / Ordering NPWT Apligraf, etc. , []  - 0 Emergency Hospital Admission (emergent condition) PROCESS - Special Needs []  - 0 Pediatric / Minor Patient Management []  - 0 Isolation Patient Management []  - 0 Hearing / Language / Visual special needs []  - 0 Assessment of Community assistance (transportation, D/C planning, etc.) []  - 0 Additional assistance / Altered mentation []  - 0 Support Surface(s) Assessment (bed, cushion, seat, etc.) INTERVENTIONS - Miscellaneous []  - 0 External ear exam []  - 0 Patient Transfer (multiple staff / Nurse, adult / Similar devices) []  -  0 Simple Staple / Suture removal (25 or less) []  - 0 Complex Staple / Suture removal (26 or more) []  - 0 Hypo/Hyperglycemic Management (do not check if billed separately) []  - 0  Ankle / Brachial Index (ABI) - do not check if billed separately Has the patient been seen at the hospital within the last three years: Yes Total Score: 0 Level Of Care: ____ Electronic Signature(s) Unsigned Entered By: Jennifer [name] on 05/16/2022 15:29:55 -------------------------------------------------------------------------------- Encounter Discharge Information Details Patient Name: Date of Service: Jennifer [name] 05/16/2022 2:45 PM Medical Record Number: [id number] Patient Account Number: [id number] Date of Birth/Sex: Treating RN: Oct 11, 1950 (72 y.o. Jennifer [name] Primary Care Jennifer [name]: Jennifer [name] Other Clinician: Betha [name] Referring Taleah Bellantoni: Treating Jennifer [name]/Extender: Jennifer [name] in Treatment: 7 Encounter Discharge Information Items Post Procedure Vitals Discharge Condition: Stable Temperature (F): 98.3 Ambulatory Status: Cane Pulse (bpm): 73 Discharge Destination: Home Respiratory Rate (breaths/min): 18 Transportation: Private Auto Blood Pressure (mmHg): 109/60 Accompanied By: self Schedule Follow-up Appointment: Yes Clinical Summary of Care: Electronic Signature(s) Unsigned Entered ByBetha [name] on 05/16/2022 16:51:30 -------------------------------------------------------------------------------- Lower Extremity Assessment Details Patient Name: Date of Service: Jennifer [name] 05/16/2022 2:45 PM Medical Record Number: [id number] Patient Account Number: [id number] Jennifer [name] ([id number]) [phone].pdf Page 3 of 9 Date of Birth/Sex: Treating RN: April 02, 1950 (72 y.o. Jennifer [name] Primary Care Izabellah Dadisman: Jennifer [name] Other Clinician: Betha [name] Referring Jennifer [name]: Treating Jasa Dundon/Extender:  Jennifer [name] in Treatment: 7 Edema Assessment Assessed: Kyra Searles: Yes] Franne Forts: Yes] Edema: [Left: Yes] [Right: Yes] Calf Left: Right: Point of Measurement: 35 cm From Medial Instep 42.3 cm 39.6 cm Ankle Left: Right: Point of Measurement: 10 cm From Medial Instep 26.4 cm 28.6 cm Vascular Assessment Pulses: Dorsalis Pedis Palpable: [Left:Yes] [Right:Yes] Electronic Signature(s) Signed: 05/16/2022 5:21:41 PM By: Midge Aver MSN RN CNS WTA Entered By: Jennifer [name] on 05/16/2022 15:22:41 -------------------------------------------------------------------------------- Multi Wound Chart Details Patient Name: Date of Service: Jennifer [name] 05/16/2022 2:45 PM Medical Record Number: [id number] Patient Account Number: [id number] Date of Birth/Sex: Treating RN: 1950-02-08 (72 y.o. Jennifer [name] Primary Care Kallee Nam: Jennifer [name] Other Clinician: Betha [name] Referring Elzada Pytel: Treating Adine Heimann/Extender: Jennifer [name] in Treatment: 7 Vital Signs Height(in): 61 Pulse(bpm): 73 Weight(lbs): 185 Blood Pressure(mmHg): 109/60 Body Mass Index(BMI): 35 Temperature(F): 98.3 Respiratory Rate(breaths/min): 16 [1:Photos:] [N/A:N/A] Right, Posterior Calcaneus Left, Medial Calcaneus N/A Wound Location: Pressure Injury Blister N/A Wounding Event: Pressure Ulcer Pressure Ulcer N/A Primary Etiology: Cataracts, Hypertension, History of Cataracts, Hypertension, History of N/A Comorbid History: pressure wounds, Neuropathy pressure wounds, Neuropathy 01/13/2022 05/05/2022 N/A Date Acquired: 7 1 N/A Weeks of Treatment: Open Open N/A Wound Status: No No N/A Wound Recurrence: 0.6x0.3x0.1 0.1x0.1x0.1 N/A Measurements L x W x D (cm) 0.141 0.008 N/A A (cm) : rea 0.014 0.001 N/A Volume (cm) : 70.10% 99.80% N/A % Reduction in Area: 70.20% 99.80% N/A % Reduction in Volume: Category/Stage III Category/Stage III  N/A ClassificationIYARI, Jennifer [name] ([id number]) [id number].pdf Page 4 of 9 Small Medium N/A Exudate Amount: Serous Serosanguineous N/A Exudate Type: amber red, brown N/A Exudate Color: Distinct, outline attached N/A N/A Wound Margin: Medium (34-66%) Large (67-100%) N/A Granulation Amount: N/A Red N/A Granulation Quality: Medium (34-66%) None Present (0%) N/A Necrotic Amount: Fat Layer (Subcutaneous Tissue): Yes Fat Layer (Subcutaneous Tissue): Yes N/A Exposed Structures: Fascia: No Fascia: No Tendon: No Tendon: No Muscle: No Muscle: No Joint: No Joint: No Bone: No Bone: No Small (1-33%) Medium (34-66%) N/A Epithelialization: Treatment Notes Electronic Signature(s) Unsigned Entered ByBetha [name] on 05/16/2022 15:22:47 -------------------------------------------------------------------------------- Multi-Disciplinary Care Plan Details Patient Name: Date of Service: Jennifer [name] 05/16/2022 2:45 PM Medical Record Number: [id number] Patient Account Number: [id number]  Date of Birth/Sex: Treating RN: November 20, 1950 (72 y.o. Jennifer [name] Primary Care Sairah Knobloch: Jennifer [name] Other Clinician: Betha [name] Referring Celisa Schoenberg: Treating Keera Altidor/Extender: Jennifer [name] in Treatment: 7 Active Inactive Necrotic Tissue Nursing Diagnoses: Impaired tissue integrity related to necrotic/devitalized tissue Knowledge deficit related to management of necrotic/devitalized tissue Goals: Necrotic/devitalized tissue will be minimized in the wound bed Date Initiated: 03/29/2022 Target Resolution Date: 05/27/2022 Goal Status: Active Patient/caregiver will verbalize understanding of reason and process for debridement of necrotic tissue Date Initiated: 03/29/2022 Target Resolution Date: 05/27/2022 Goal Status: Active Interventions: Assess patient pain level pre-, during and post procedure and prior to discharge Provide  education on necrotic tissue and debridement process Treatment Activities: Excisional debridement : 03/24/2022 Notes: Wound/Skin Impairment Nursing Diagnoses: Impaired tissue integrity Knowledge deficit related to ulceration/compromised skin integrity Goals: Patient will demonstrate a reduced rate of smoking or cessation of smoking Date Initiated: 03/29/2022 Target Resolution Date: 05/27/2022 Goal Status: Active Patient/caregiver will verbalize understanding of skin care regimen Date Initiated: 03/29/2022 Date Inactivated: 04/29/2022 Target Resolution Date: 04/28/2022 Goal Status: Met Ulcer/skin breakdown will have a volume reduction of 30% by week 4 Jennifer [name], Jennifer [name] ([id number]) [phone].pdf Page 5 of 9 Date Initiated: 03/29/2022 Target Resolution Date: 05/26/2022 Goal Status: Active Ulcer/skin breakdown will have a volume reduction of 50% by week 8 Date Initiated: 03/29/2022 Target Resolution Date: 05/28/2022 Goal Status: Active Ulcer/skin breakdown will have a volume reduction of 80% by week 12 Date Initiated: 03/29/2022 Target Resolution Date: 06/28/2022 Goal Status: Active Ulcer/skin breakdown will heal within 14 weeks Date Initiated: 03/29/2022 Target Resolution Date: 07/12/2022 Goal Status: Active Interventions: Assess patient/caregiver ability to obtain necessary supplies Assess patient/caregiver ability to perform ulcer/skin care regimen upon admission and as needed Assess ulceration(s) every visit Provide education on ulcer and skin care Treatment Activities: Skin care regimen initiated : 03/24/2022 Notes: Electronic Signature(s) Signed: 05/16/2022 5:21:41 PM By: Midge Aver MSN RN CNS WTA Entered By: Jennifer [name] on 05/16/2022 15:30:05 -------------------------------------------------------------------------------- Pain Assessment Details Patient Name: Date of Service: Jennifer [name] 05/16/2022 2:45 PM Medical Record Number:  [id number] Patient Account Number: [id number] Date of Birth/Sex: Treating RN: August 06, 1950 (72 y.o. Jennifer [name] Primary Care Anan Dapolito: Jennifer [name] Other Clinician: Betha [name] Referring Giulia Hickey: Treating Jaunita Mikels/Extender: Jennifer [name] in Treatment: 7 Active Problems Location of Pain Severity and Description of Pain Patient Has Paino Yes Site Locations Pain Location: Pain in Ulcers Duration of the Pain. Constant / Intermittento Intermittent Rate the pain. Current Pain Level: 0 Character of Pain Describe the Pain: Shooting Pain Management and Medication Current Pain Management: Medication: No Cold Application: No Rest: Yes Massage: No Activity: No T.E.N.S.: No Heat Application: No Leg drop or elevation: No Is the Current Pain Management Adequate: Inadequate How does your wound impact your activities of daily Jennifer [name], Jennifer [name] ([id number]) [id number] Page 6 of 9 Sleep: No Bathing: No Appetite: No Relationship With Others: No Bladder Continence: No Emotions: No Bowel Continence: No Work: No Toileting: No Drive: No Dressing: No Hobbies: No Psychologist, prison and probation services) Signed: 05/16/2022 5:21:41 PM By: Midge Aver MSN RN CNS WTA Entered By: Jennifer [name] on 05/16/2022 15:19:19 -------------------------------------------------------------------------------- Patient/Caregiver Education Details Patient Name: Date of Service: Jennifer [name] 4/29/2024andnbsp2:45 PM Medical Record Number: [id number] Patient Account Number: [id number] Date of Birth/Gender: Treating RN: [birth year] (72 y.o. Jennifer [name] Primary Care Physician: Jennifer [name] Other Clinician: Betha [name] Referring Physician: Treating Physician/Extender: Jennifer [name] in Treatment: 7 Education Assessment Education Provided To: Patient Education Topics  Provided Wound/Skin Impairment: Handouts: Other:  continue wound care as directed Methods: Explain/Verbal Responses: State content correctly Electronic Signature(s) Unsigned Entered By: Jennifer [name] on 05/16/2022 15:30:45 -------------------------------------------------------------------------------- Wound Assessment Details Patient Name: Date of Service: Jennifer [name] 05/16/2022 2:45 PM Medical Record Number: [id number] Patient Account Number: [id number] Date of Birth/Sex: Treating RN: [birth year] (72 y.o. Jennifer [name] Primary Care Nabeel Gladson: Jennifer [name] Other Clinician: Betha [name] Referring Girtrude Enslin: Treating Tyleah Loh/Extender: Jennifer [name] in Treatment: 7 Wound Status Wound Number: 1 Primary Pressure Ulcer Etiology: Wound Location: Right, Posterior Calcaneus Wound Status: Open Wounding Event: Pressure Injury Comorbid Cataracts, Hypertension, History of pressure wounds, Date Acquired: 01/13/2022 History: Neuropathy Weeks Of Treatment: 7 Clustered Wound: No Photos Jennifer [name], Jennifer [name] ([id number]) [id number] Page 7 of 9 Wound Measurements Length: (cm) 0.6 Width: (cm) 0.3 Depth: (cm) 0.1 Area: (cm) 0.141 Volume: (cm) 0.014 % Reduction in Area: 70.1% % Reduction in Volume: 70.2% Epithelialization: Small (1-33%) Wound Description Classification: Category/Stage III Wound Margin: Distinct, outline attached Exudate Amount: Small Exudate Type: Serous Exudate Color: amber Foul Odor After Cleansing: No Slough/Fibrino No Wound Bed Granulation Amount: Medium (34-66%) Exposed Structure Necrotic Amount: Medium (34-66%) Fascia Exposed: No Necrotic Quality: Adherent Slough Fat Layer (Subcutaneous Tissue) Exposed: Yes Tendon Exposed: No Muscle Exposed: No Joint Exposed: No Bone Exposed: No Treatment Notes Wound #1 (Calcaneus) Wound Laterality: Right, Posterior Cleanser Soap and Water Discharge Instruction: Gently cleanse wound with antibacterial soap,  rinse and pat dry prior to dressing wounds Peri-Wound Care Topical Primary Dressing Hydrofera Blue Ready Transfer Foam, 2.5x2.5 (in/in) Discharge Instruction: Apply Hydrofera Blue Ready to wound bed as directed Secondary Dressing Gauze Discharge Instruction: As directed: dry, moistened with saline or moistened with Dakins Solution Secured With [employer] or Non-Sterile 6-ply 4.5x4 (yd/yd) Discharge Instruction: Apply Kerlix as directed Compression Wrap Compression Stockings Add-Ons Electronic Signature(s) Signed: 05/16/2022 5:21:41 PM By: Midge Aver MSN RN CNS WTA Entered By: Jennifer [name] on 05/16/2022 15:20:07 Wound Assessment Details -------------------------------------------------------------------------------- Jennifer [name] ([id number]) [id number] Page 8 of 9 Patient Name: Date of Service: Jennifer [name] 05/16/2022 2:45 PM Medical Record Number: [id number] Patient Account Number: [id number] Date of Birth/Sex: Treating RN: 1950-02-23 (72 y.o. Jennifer [name] Primary Care Rudie Rikard: Jennifer [name] Other Clinician: Betha [name] Referring Danyetta Gillham: Treating Cougar Imel/Extender: Jennifer [name] in Treatment: 7 Wound Status Wound Number: 3 Primary Pressure Ulcer Etiology: Wound Location: Left, Medial Calcaneus Wound Status: Healed - Epithelialized Wounding Event: Blister Comorbid Cataracts, Hypertension, History of pressure wounds, Date Acquired: 05/05/2022 History: Neuropathy Weeks Of Treatment: 1 Clustered Wound: No Photos Wound Measurements Length: (cm) 0 % Reduction in Area: 100% Width: (cm) 0 % Reduction in Volume: 100% Depth: (cm) 0 Epithelialization: Large (67-100%) Area: (cm) 0 Volume: (cm) 0 Wound Description Classification: Category/Stage III Foul Odor After Cleansing: No Exudate Amount: Medium Slough/Fibrino No Exudate Type: Serosanguineous Exudate Color: red, brown Wound  Bed Granulation Amount: None Present (0%) Exposed Structure Necrotic Amount: None Present (0%) Fascia Exposed: No Fat Layer (Subcutaneous Tissue) Exposed: Yes Tendon Exposed: No Muscle Exposed: No Joint Exposed: No Bone Exposed: No Treatment Notes Wound #3 (Calcaneus) Wound Laterality: Left, Medial Cleanser Peri-Wound Care Topical Primary Dressing Secondary Dressing Secured With Compression Wrap Compression Stockings Add-Ons Electronic Signature(s) Signed: 05/16/2022 5:21:41 PM By: Midge Aver MSN RN CNS WTA Entered By: Jennifer [name] on 05/16/2022 15:27:49 Jennifer [name] ([id number]) [id number] Page 9 of 9 -------------------------------------------------------------------------------- Vitals Details Patient Name: Date of Service: Jennifer [name] 05/16/2022 2:45 PM Medical Record Number: [id number]  Patient Account Number: [id number] Date of Birth/Sex: Treating RN: 10-02-1950 (72 y.o. Jennifer [name] Primary Care Lunabella Badgett: Jennifer [name] Other Clinician: Betha [name] Referring Asees Manfredi: Treating Nellie Pester/Extender: Jennifer [name] in Treatment: 7 Vital Signs Time Taken: 15:11 Temperature (F): 98.3 Height (in): 61 Pulse (bpm): 73 Weight (lbs): 185 Respiratory Rate (breaths/min): 16 Body Mass Index (BMI): 35 Blood Pressure (mmHg): 109/60 Reference Range: 80 - 120 mg / dl Electronic Signature(s) Unsigned Entered ByBetha [name] on 05/16/2022 15:11:45 Signature(s): Date(s):

## 2022-05-16 NOTE — Telephone Encounter (Signed)
LVM to schedule f/up appt and discuss referral to GI

## 2022-05-16 NOTE — Progress Notes (Signed)
TASHANNA, DOLIN ([id number]) [id number] Page 1 of 6 Visit Report for 05/16/2022 Chief Complaint Document Details Patient Name: Date of Service: Jennifer [name] 05/16/2022 2:45 PM Medical Record Number: [id number] Patient Account Number: [id number] Date of Birth/Sex: Treating RN: March 09, 1950 (72 y.o. [name] Primary Care Provider: [name] Other Clinician: [name] Referring Provider: Treating Provider/Extender: [name] in Treatment: 7 Information Obtained from: Patient Chief Complaint 03/24/2022; patient is here for review of wounds on the 2 locations on the left lateral foot Electronic Signature(s) Signed: 05/16/2022 2:42:21 PM By: Allen Derry PA-C Entered By: Allen Derry on 05/16/2022 14:42:21 -------------------------------------------------------------------------------- Debridement Details Patient Name: Date of Service: Jennifer [name] 05/16/2022 2:45 PM Medical Record Number: [id number] Patient Account Number: [id number] Date of Birth/Sex: Treating RN: November 12, 1950 (72 y.o. [name] Primary Care Provider: [name] Other Clinician: [name] Referring Provider: Treating Provider/Extender: [name] in Treatment: 7 Debridement Performed for Assessment: Wound #1 Right,Posterior Calcaneus Performed By: Physician Allen Derry, PA-C Debridement Type: Debridement Level of Consciousness (Pre-procedure): Awake and Alert Pre-procedure Verification/Time Out Yes - 15:28 Taken: Start Time: 15:28 Percent of Wound Bed Debrided: 100% T Area Debrided (cm): otal 0.14 Tissue and other material debrided: Viable, Non-Viable, Callus, Slough, Subcutaneous, Slough Level: Skin/Subcutaneous Tissue Debridement Description: Excisional Instrument: Curette Bleeding: None Response to Treatment: Procedure was tolerated well Level of Consciousness (Post- Awake and  Alert procedure): Post Debridement Measurements of Total Wound Length: (cm) 0.6 Stage: Category/Stage III Width: (cm) 0.3 Depth: (cm) 0.1 Volume: (cm) 0.014 Character of Wound/Ulcer Post Debridement: Stable Post Procedure Diagnosis Same as Pre-procedure Electronic Signature(s) Signed: 05/16/2022 5:21:41 PM By: Midge Aver MSN RN CNS WTA Signed: 05/16/2022 6:40:35 PM By: Allen Derry PA-C Signed: 05/17/2022 5:12:01 PM By: [name] Entered By: [name] on 05/16/2022 15:29:23 [name] ([id number]) [id number] Page 2 of 6 -------------------------------------------------------------------------------- HPI Details Patient Name: Date of Service: Jennifer [name] 05/16/2022 2:45 PM Medical Record Number: [id number] Patient Account Number: [id number] Date of Birth/Sex: Treating RN: April 21, 1950 (73 y.o. [name] Primary Care Provider: [name] Other Clinician: [name] Referring Provider: Treating Provider/Extender: [name] in Treatment: 7 History of Present Illness HPI Description: ADMISSION 03/24/2022 This is a 72 year old woman who has had 2 small open areas on the right lateral foot. She is not a diabetic but does have peripheral neuropathy. She was seen by [facility] yesterday. ABIs w She has not had previous wounds in this area. ABI 1.20 on the right and TBI at 1.02. On the left 1.23 and 1.00 on the left Past medical history; is not noted not a diabetic. She has gastroesophageal reflux disease, high cholesterol, hyperlipidemia, hypertension, hypothyroidism, sleep apnea 04-07-2022 upon evaluation today patient appears to be doing well currently in regard to her wound. The lateral portion of her foot appears to be completely healed which is great news and I am very pleased in that regard. In regard to the medial portion of her foot and the heel location this is still open and  does appear to need some sharp debridement today. I discussed with her today that we are going to proceed with debridement at this point. She is in agreement with that plan. 04-14-2022 upon evaluation today patient appears to be doing well currently in regard to her wound. She has been tolerating the dressing changes without complication and fortunately I do not see any signs of active infection locally nor systemically at  this time. She did have some area of dark discoloration where I used a silver nitrate last week because of this she felt like that the [facility] was doing worse for her and she quit using it just using Neosporin. With that being said right now I think she really needs to get back to using the [facility] and she needs to be using offloading shoe. 04-22-2022 upon evaluation today patient appears to be doing well currently in regard to the wounds on her heel which are showing signs of improvement and the actual wound is measuring smaller she does have some irritation around the wound, cleaned away some of this callus today. Fortunately I do not see any evidence of active infection locally nor systemically which is great news. 04-29-2022 upon evaluation today patient appears to be doing well currently in regard to her wound. She is measuring about the same but nonetheless is still showing signs of improvement there is a lot of the discoloration around the edge which is improving little by little as we debride this away as well. Fortunately I am very pleased with where things stand. 05-09-2022 upon evaluation today patient appears to be doing well currently in regard to her wound. This is actually doing well on the right heel unfortunately she has a new blister on the left heel she does not know when that occurred exactly and what happened. Fortunately there does not appear to be any signs of active infection locally nor systemically which is great news. Nonetheless this blister is  going to need to be removed so we get this feeling of movement in the right direction. 05-16-2022 upon evaluation today patient appears to be doing well currently in regard to her wounds the left heel is healed the right heel is smaller and overall I think that removing in the right direction. Electronic Signature(s) Signed: 05/16/2022 5:44:06 PM By: Allen Derry PA-C Entered By: Allen Derry on 05/16/2022 17:44:06 -------------------------------------------------------------------------------- Physical Exam Details Patient Name: Date of Service: Jennifer [name] 05/16/2022 2:45 PM Medical Record Number: [id number] Patient Account Number: [id number] Date of Birth/Sex: Treating RN: 11-14-1950 (72 y.o. [name] Primary Care Provider: [name] Other Clinician: [name] Referring Provider: Treating Provider/Extender: [name] in Treatment: 7 Constitutional Well-nourished and well-hydrated in no acute distress. Respiratory normal breathing without difficulty. Psychiatric this patient is able to make decisions and demonstrates good insight into disease process. Alert and Oriented x 3. pleasant and cooperative. Notes Upon inspection patient's right heel did require sharp debridement the left heel was completely closed upon inspection today. I am actually extremely pleased with where we stand and how things appear at this point. Electronic Signature(s) Belmont, Renea Ee ([id number]) [id number] Page 3 of 6 Signed: 05/16/2022 5:44:22 PM By: Allen Derry PA-C Entered By: Allen Derry on 05/16/2022 17:44:22 -------------------------------------------------------------------------------- Physician Orders Details Patient Name: Date of Service: Jennifer [name] 05/16/2022 2:45 PM Medical Record Number: [id number] Patient Account Number: [id number] Date of Birth/Sex: Treating RN: 1950-03-26 (72 y.o. [name] Primary  Care Provider: [name] Other Clinician: [name] Referring Provider: Treating Provider/Extender: [name] in Treatment: 7 Verbal / Phone Orders: Yes Clinician: Midge Aver Read Back and Verified: Yes Diagnosis Coding ICD-10 Coding Code Description G90.09 Other idiopathic peripheral autonomic neuropathy L89.613 Pressure ulcer of right heel, stage 3 L89.623 Pressure ulcer of left heel, stage 3 Follow-up Appointments Return Appointment in 1 week. Bathing/ [employer] wounds with antibacterial soap and  water. Off-Loading Heel suspension boot - not wearing Wound Treatment Wound #1 - Calcaneus Wound Laterality: Right, Posterior Cleanser: Soap and Water 3 x Per Week/30 Days Discharge Instructions: Gently cleanse wound with antibacterial soap, rinse and pat dry prior to dressing wounds Prim Dressing: Hydrofera Blue Ready Transfer Foam, 2.5x2.5 (in/in) (Generic) 3 x Per Week/30 Days ary Discharge Instructions: Apply Hydrofera Blue Ready to wound bed as directed Secondary Dressing: Gauze 3 x Per Week/30 Days Discharge Instructions: As directed: dry, moistened with saline or moistened with Dakins Solution Secured With: [employer] or Non-Sterile 6-ply 4.5x4 (yd/yd) 3 x Per Week/30 Days Discharge Instructions: Apply Kerlix as directed Electronic Signature(s) Signed: 05/16/2022 6:40:35 PM By: Allen Derry PA-C Signed: 05/17/2022 5:12:01 PM By: [name] Entered By: [name] on 05/16/2022 15:29:49 -------------------------------------------------------------------------------- Problem List Details Patient Name: Date of Service: Jennifer [name] 05/16/2022 2:45 PM Medical Record Number: [id number] Patient Account Number: [id number] Date of Birth/Sex: Treating RN: April 07, 1950 (72 y.o. [name] Primary Care Provider: [name] Other Clinician: [name] Referring Provider: Treating Provider/Extender:  [name] in Treatment: 7 Active Problems ICD-10 Encounter Code Description Active Date MDM Diagnosis G90.09 Other idiopathic peripheral autonomic neuropathy 03/24/2022 No Yes DONELDA, MAILHOT ([id number]) [phone].pdf Page 4 of 6 L89.613 Pressure ulcer of right heel, stage 3 03/24/2022 No Yes L89.623 Pressure ulcer of left heel, stage 3 05/09/2022 No Yes Inactive Problems Resolved Problems Electronic Signature(s) Signed: 05/16/2022 2:42:18 PM By: Allen Derry PA-C Entered By: Allen Derry on 05/16/2022 14:42:17 -------------------------------------------------------------------------------- Progress Note Details Patient Name: Date of Service: Jennifer [name] 05/16/2022 2:45 PM Medical Record Number: [id number] Patient Account Number: [id number] Date of Birth/Sex: Treating RN: 08-21-1950 (72 y.o. [name] Primary Care Provider: [name] Other Clinician: [name] Referring Provider: Treating Provider/Extender: [name] in Treatment: 7 Subjective Chief Complaint Information obtained from Patient 03/24/2022; patient is here for review of wounds on the 2 locations on the left lateral foot History of Present Illness (HPI) ADMISSION 03/24/2022 This is a 72 year old woman who has had 2 small open areas on the right lateral foot. She is not a diabetic but does have peripheral neuropathy. She was seen by [facility] yesterday. ABIs w She has not had previous wounds in this area. ABI 1.20 on the right and TBI at 1.02. On the left 1.23 and 1.00 on the left Past medical history; is not noted not a diabetic. She has gastroesophageal reflux disease, high cholesterol, hyperlipidemia, hypertension, hypothyroidism, sleep apnea 04-07-2022 upon evaluation today patient appears to be doing well currently in regard to her wound. The lateral portion of her foot appears to be completely healed  which is great news and I am very pleased in that regard. In regard to the medial portion of her foot and the heel location this is still open and does appear to need some sharp debridement today. I discussed with her today that we are going to proceed with debridement at this point. She is in agreement with that plan. 04-14-2022 upon evaluation today patient appears to be doing well currently in regard to her wound. She has been tolerating the dressing changes without complication and fortunately I do not see any signs of active infection locally nor systemically at this time. She did have some area of dark discoloration where I used a silver nitrate last week because of this she felt like that the [facility] was doing worse for her and she  quit using it just using Neosporin. With that being said right now I think she really needs to get back to using the [facility] and she needs to be using offloading shoe. 04-22-2022 upon evaluation today patient appears to be doing well currently in regard to the wounds on her heel which are showing signs of improvement and the actual wound is measuring smaller she does have some irritation around the wound, cleaned away some of this callus today. Fortunately I do not see any evidence of active infection locally nor systemically which is great news. 04-29-2022 upon evaluation today patient appears to be doing well currently in regard to her wound. She is measuring about the same but nonetheless is still showing signs of improvement there is a lot of the discoloration around the edge which is improving little by little as we debride this away as well. Fortunately I am very pleased with where things stand. 05-09-2022 upon evaluation today patient appears to be doing well currently in regard to her wound. This is actually doing well on the right heel unfortunately she has a new blister on the left heel she does not know when that occurred exactly and what happened.  Fortunately there does not appear to be any signs of active infection locally nor systemically which is great news. Nonetheless this blister is going to need to be removed so we get this feeling of movement in the right direction. 05-16-2022 upon evaluation today patient appears to be doing well currently in regard to her wounds the left heel is healed the right heel is smaller and overall I think that removing in the right direction. KAELEA, GATHRIGHT ([id number]) [id number] Page 5 of 6 Objective Constitutional Well-nourished and well-hydrated in no acute distress. Vitals Time Taken: 3:11 PM, Height: 61 in, Weight: 185 lbs, BMI: 35, Temperature: 98.3 F, Pulse: 73 bpm, Respiratory Rate: 16 breaths/min, Blood Pressure: 109/60 mmHg. Respiratory normal breathing without difficulty. Psychiatric this patient is able to make decisions and demonstrates good insight into disease process. Alert and Oriented x 3. pleasant and cooperative. General Notes: Upon inspection patient's right heel did require sharp debridement the left heel was completely closed upon inspection today. I am actually extremely pleased with where we stand and how things appear at this point. Integumentary (Hair, Skin) Wound #1 status is Open. Original cause of wound was Pressure Injury. The date acquired was: 01/13/2022. The wound has been in treatment 7 weeks. The wound is located on the Right,Posterior Calcaneus. The wound measures 0.6cm length x 0.3cm width x 0.1cm depth; 0.141cm^2 area and 0.014cm^3 volume. There is Fat Layer (Subcutaneous Tissue) exposed. There is a small amount of serous drainage noted. The wound margin is distinct with the outline attached to the wound base. There is medium (34-66%) granulation within the wound bed. There is a medium (34-66%) amount of necrotic tissue within the wound bed including Adherent Slough. Wound #3 status is Healed - Epithelialized. Original cause of  wound was Blister. The date acquired was: 05/05/2022. The wound has been in treatment 1 weeks. The wound is located on the Left,Medial Calcaneus. The wound measures 0cm length x 0cm width x 0cm depth; 0cm^2 area and 0cm^3 volume. There is Fat Layer (Subcutaneous Tissue) exposed. There is a medium amount of serosanguineous drainage noted. There is no granulation within the wound bed. There is no necrotic tissue within the wound bed. Assessment Active Problems ICD-10 Other idiopathic peripheral autonomic neuropathy Pressure ulcer of right heel, stage 3 Pressure ulcer of left  heel, stage 3 Procedures Wound #1 Pre-procedure diagnosis of Wound #1 is a Pressure Ulcer located on the Right,Posterior Calcaneus . There was a Excisional Skin/Subcutaneous Tissue Debridement with a total area of 0.14 sq cm performed by Allen Derry, PA-C. With the following instrument(s): Curette to remove Viable and Non-Viable tissue/material. Material removed includes Callus, Subcutaneous Tissue, and Slough. A time out was conducted at 15:28, prior to the start of the procedure. There was no bleeding. The procedure was tolerated well. Post Debridement Measurements: 0.6cm length x 0.3cm width x 0.1cm depth; 0.014cm^3 volume. Post debridement Stage noted as Category/Stage III. Character of Wound/Ulcer Post Debridement is stable. Post procedure Diagnosis Wound #1: Same as Pre-Procedure Plan Follow-up Appointments: Return Appointment in 1 week. Bathing/ Shower/ Hygiene: Wash wounds with antibacterial soap and water. Off-Loading: Heel suspension boot - not wearing WOUND #1: - Calcaneus Wound Laterality: Right, Posterior Cleanser: Soap and Water 3 x Per Week/30 Days Discharge Instructions: Gently cleanse wound with antibacterial soap, rinse and pat dry prior to dressing wounds Prim Dressing: Hydrofera Blue Ready Transfer Foam, 2.5x2.5 (in/in) (Generic) 3 x Per Week/30 Days ary Discharge Instructions: Apply Hydrofera Blue  Ready to wound bed as directed Secondary Dressing: Gauze 3 x Per Week/30 Days Discharge Instructions: As directed: dry, moistened with saline or moistened with Dakins Solution Secured With: [employer] Sterile or Non-Sterile 6-ply 4.5x4 (yd/yd) 3 x Per Week/30 Days Discharge Instructions: Apply Kerlix as directed 1. I am going to recommend that we have the patient continue to monitor for any signs of infection or worsening. In general I think that we are making good SHAR, PAEZ ([id number]) [id number] Page 6 of 6 progress and very pleased in that regard. 2. I am also can recommend that the patient should continue to tolerate the dressing changes without complication. I think the Hydrofera Blue is doing a good job. 3. We did discuss the possibility of using some 40% urea cream to help with the callus around the heel but at this point I think that she needs to avoid that into the wounds completely healed +2 weeks. We will see patient back for reevaluation in 1 week here in the clinic. If anything worsens or changes patient will contact our office for additional recommendations. Electronic Signature(s) Signed: 05/16/2022 5:45:02 PM By: Allen Derry PA-C Entered By: Allen Derry on 05/16/2022 17:45:02 -------------------------------------------------------------------------------- SuperBill Details Patient Name: Date of Service: Jennifer [name] 05/16/2022 Medical Record Number: [id number] Patient Account Number: [id number] Date of Birth/Sex: Treating RN: Mar 31, 1950 (72 y.o. [name] Primary Care Provider: [name] Other Clinician: [name] Referring Provider: Treating Provider/Extender: [name] in Treatment: 7 Diagnosis Coding ICD-10 Codes Code Description G90.09 Other idiopathic peripheral autonomic neuropathy L89.613 Pressure ulcer of right heel, stage 3 L89.623 Pressure ulcer of left heel, stage  3 Facility Procedures : CPT4 Code: [id number] Description: 11042 - DEB SUBQ TISSUE 20 SQ CM/< ICD-10 Diagnosis Description L89.613 Pressure ulcer of right heel, stage 3 Modifier: Quantity: 1 Physician Procedures : CPT4 Code Description Modifier [id number] 11042 - WC PHYS SUBQ TISS 20 SQ CM ICD-10 Diagnosis Description L89.613 Pressure ulcer of right heel, stage 3 Quantity: 1 Electronic Signature(s) Signed: 05/16/2022 5:45:23 PM By: Allen Derry PA-C Entered By: Allen Derry on 05/16/2022 17:45:23

## 2022-05-17 NOTE — Telephone Encounter (Signed)
Pt called back and schedule in office appt for 05/18/22

## 2022-05-17 NOTE — Telephone Encounter (Signed)
Pt has called back and scheduled appt for 05/18/22

## 2022-05-18 ENCOUNTER — Ambulatory Visit (INDEPENDENT_AMBULATORY_CARE_PROVIDER_SITE_OTHER): Payer: Medicare Other | Admitting: Family

## 2022-05-18 ENCOUNTER — Encounter: Payer: Self-pay | Admitting: Family

## 2022-05-18 VITALS — BP 118/70 | HR 71 | Temp 98.7°F | Ht 64.0 in | Wt 179.4 lb

## 2022-05-18 DIAGNOSIS — K922 Gastrointestinal hemorrhage, unspecified: Secondary | ICD-10-CM

## 2022-05-18 DIAGNOSIS — I10 Essential (primary) hypertension: Secondary | ICD-10-CM | POA: Diagnosis not present

## 2022-05-18 DIAGNOSIS — F1090 Alcohol use, unspecified, uncomplicated: Secondary | ICD-10-CM | POA: Diagnosis not present
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
## 2022-05-20 NOTE — Assessment & Plan Note (Addendum)
Hospitalization course reviewed.  Medications reconciled.  We discussed role of NSAIDs and alcohol.  Patient politely declines returning to  GI at this time.  She previously has seen [facility] gastroenterology and like to return for continuity of care.  I placed a referral to [facility] gastroenterology, Dr. Mia Creek

## 2022-05-20 NOTE — Patient Instructions (Signed)
Please reach out to [facility] as printed for you today.   I am here for you and rooting for you.   Please let me know how you are doing

## 2022-05-20 NOTE — Assessment & Plan Note (Signed)
Chronic, stable.  Patient is on losartan 100 mg daily.  She is no longer on triamterene hydrochlorothiazide since hospitalization 04/2022.
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
## 2022-05-23 ENCOUNTER — Ambulatory Visit: Payer: Medicare Other

## 2022-05-23 ENCOUNTER — Encounter: Payer: Medicare Other | Attending: Physician Assistant | Admitting: Physician Assistant

## 2022-05-23 DIAGNOSIS — G9009 Other idiopathic peripheral autonomic neuropathy: Secondary | ICD-10-CM | POA: Diagnosis not present

## 2022-05-23 DIAGNOSIS — L89613 Pressure ulcer of right heel, stage 3: Secondary | ICD-10-CM | POA: Diagnosis not present

## 2022-05-23 DIAGNOSIS — I10 Essential (primary) hypertension: Secondary | ICD-10-CM | POA: Insufficient documentation

## 2022-05-23 DIAGNOSIS — L89623 Pressure ulcer of left heel, stage 3: Secondary | ICD-10-CM | POA: Insufficient documentation
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
## 2022-05-23 NOTE — Progress Notes (Signed)
RENNER, HURRLE ([id number]) [id number] Page 1 of 5 Visit Report for 05/23/2022 Chief Complaint Document Details Patient Name: Date of Service: Jennifer [name] 05/23/2022 8:00 A M Medical Record Number: [id number] Patient Account Number: [id number] Date of Birth/Sex: Treating RN: 1950/06/17 (72 y.o. Freddy Finner Primary Care Provider: [name] Other Clinician: Referring Provider: Treating Provider/Extender: [name] in Treatment: 8 Information Obtained from: Patient Chief Complaint 03/24/2022; patient is here for review of wounds on the 2 locations on the left lateral foot Electronic Signature(s) Signed: 05/23/2022 8:00:45 AM By: Allen Derry PA-C Entered By: Allen Derry on 05/23/2022 08:00:45 -------------------------------------------------------------------------------- HPI Details Patient Name: Date of Service: Jennifer Jeremy Johann [facility] [name] 05/23/2022 8:00 A M Medical Record Number: [id number] Patient Account Number: [id number] Date of Birth/Sex: Treating RN: 06-10-1950 (72 y.o. Freddy Finner Primary Care Provider: [name] Other Clinician: Referring Provider: Treating Provider/Extender: [name] in Treatment: 8 History of Present Illness HPI Description: ADMISSION 03/24/2022 This is a 72 year old woman who has had 2 small open areas on the right lateral foot. She is not a diabetic but does have peripheral neuropathy. She was seen by [facility] yesterday. ABIs w She has not had previous wounds in this area. ABI 1.20 on the right and TBI at 1.02. On the left 1.23 and 1.00 on the left Past medical history; is not noted not a diabetic. She has gastroesophageal reflux disease, high cholesterol, hyperlipidemia, hypertension, hypothyroidism, sleep apnea 04-07-2022 upon evaluation today patient appears to be doing well currently in regard to her wound. The lateral portion of her foot  appears to be completely healed which is great news and I am very pleased in that regard. In regard to the medial portion of her foot and the heel location this is still open and does appear to need some sharp debridement today. I discussed with her today that we are going to proceed with debridement at this point. She is in agreement with that plan. 04-14-2022 upon evaluation today patient appears to be doing well currently in regard to her wound. She has been tolerating the dressing changes without complication and fortunately I do not see any signs of active infection locally nor systemically at this time. She did have some area of dark discoloration where I used a silver nitrate last week because of this she felt like that the [facility] was doing worse for her and she quit using it just using Neosporin. With that being said right now I think she really needs to get back to using the [facility] and she needs to be using offloading shoe. 04-22-2022 upon evaluation today patient appears to be doing well currently in regard to the wounds on her heel which are showing signs of improvement and the actual wound is measuring smaller she does have some irritation around the wound, cleaned away some of this callus today. Fortunately I do not see any evidence of active infection locally nor systemically which is great news. 04-29-2022 upon evaluation today patient appears to be doing well currently in regard to her wound. She is measuring about the same but nonetheless is still showing signs of improvement there is a lot of the discoloration around the edge which is improving little by little as we debride this away as well. Fortunately I am very pleased with where things stand. 05-09-2022 upon evaluation today patient appears to be doing well currently in regard to her wound. This is actually doing well on  the right heel unfortunately she has a new blister on the left heel she does not know when that  occurred exactly and what happened. Fortunately there does not appear to be any signs of active infection locally nor systemically which is great news. Nonetheless this blister is going to need to be removed so we get this feeling of movement in the right direction. 05-16-2022 upon evaluation today patient appears to be doing well currently in regard to her wounds the left heel is healed the right heel is smaller and overall I think that removing in the right direction. 05-23-2022 upon evaluation today patient appears to be doing well currently in regard to her wound which actually appears to be completely healed bilaterally. Fortunately there does not appear to be any signs of active infection locally or systemically at this time. No fevers, chills, nausea, vomiting, or diarrhea. Electronic Signature(s) [place], Renea Ee ([id number]) [id number] Page 2 of 5 Signed: 05/23/2022 8:34:10 AM By: Allen Derry PA-C Entered By: Allen Derry on 05/23/2022 08:34:10 -------------------------------------------------------------------------------- Physical Exam Details Patient Name: Date of Service: Jennifer [name] 05/23/2022 8:00 A M Medical Record Number: [id number] Patient Account Number: [id number] Date of Birth/Sex: Treating RN: July 17, 1950 (72 y.o. Freddy Finner Primary Care Provider: [name] Other Clinician: Referring Provider: Treating Provider/Extender: [name] in Treatment: 8 Constitutional Well-nourished and well-hydrated in no acute distress. Respiratory normal breathing without difficulty. Psychiatric this patient is able to make decisions and demonstrates good insight into disease process. Alert and Oriented x 3. pleasant and cooperative. Notes Upon inspection patient's wound bed actually showed signs of good granulation and epithelization at this point. Fortunately I do not see any signs of active infection locally nor  systemically which is great news and overall I am extremely pleased with where we stand today. Electronic Signature(s) Signed: 05/23/2022 8:34:24 AM By: Allen Derry PA-C Entered By: Allen Derry on 05/23/2022 08:34:23 -------------------------------------------------------------------------------- Physician Orders Details Patient Name: Date of Service: Jennifer [name] 05/23/2022 8:00 A M Medical Record Number: [id number] Patient Account Number: [id number] Date of Birth/Sex: Treating RN: 09/25/1950 (73 y.o. Freddy Finner Primary Care Provider: [name] Other Clinician: Referring Provider: Treating Provider/Extender: [name] in Treatment: 8 Verbal / Phone Orders: No Diagnosis Coding ICD-10 Coding Code Description G90.09 Other idiopathic peripheral autonomic neuropathy L89.613 Pressure ulcer of right heel, stage 3 L89.623 Pressure ulcer of left heel, stage 3 Discharge From [facility] Discharge from W[facility] Care Center Treatment Complete - apply 40 percent urea cream daily to start on 05/30/22, apply sock over cream in the pm, next morning wash foot with cloth, then apply regular lotion, repeat times 1 month Electronic Signature(s) Signed: 05/23/2022 6:09:56 PM By: Allen Derry PA-C Signed: 05/25/2022 9:53:20 AM By: Yevonne Pax RN Entered By: Yevonne Pax on 05/23/2022 08:32:18 -------------------------------------------------------------------------------- Problem List Details Patient Name: Date of Service: Jennifer [name] 05/23/2022 8:00 A M Medical Record Number: [id number] Patient Account Number: [id number] Date of Birth/Sex: Treating RN: [birth year] (72 y.o. Freddy Finner Primary Care Provider: [name] Other Clinician: Referring Provider: Treating Provider/Extender: Jearld Shines [place], [place] ([id number]) [id number] Page 3 of 5 Weeks in Treatment: 8 Active  Problems ICD-10 Encounter Code Description Active Date MDM Diagnosis G90.09 Other idiopathic peripheral autonomic neuropathy 03/24/2022 No Yes L89.613 Pressure ulcer of right heel, stage 3 03/24/2022 No Yes L89.623 Pressure ulcer of left heel, stage 3 05/09/2022 No Yes Inactive Problems Resolved Problems Electronic Signature(s) Signed: 05/23/2022 8:00:40 AM  By: Allen Derry PA-C Entered By: Allen Derry on 05/23/2022 08:00:40 -------------------------------------------------------------------------------- Progress Note Details Patient Name: Date of Service: Jennifer [name] 05/23/2022 8:00 A M Medical Record Number: [id number] Patient Account Number: [id number] Date of Birth/Sex: Treating RN: 1950/04/19 (72 y.o. Freddy Finner Primary Care Provider: [name] Other Clinician: Referring Provider: Treating Provider/Extender: [name] in Treatment: 8 Subjective Chief Complaint Information obtained from Patient 03/24/2022; patient is here for review of wounds on the 2 locations on the left lateral foot History of Present Illness (HPI) ADMISSION 03/24/2022 This is a 72 year old woman who has had 2 small open areas on the right lateral foot. She is not a diabetic but does have peripheral neuropathy. She was seen by [facility] yesterday. ABIs w She has not had previous wounds in this area. ABI 1.20 on the right and TBI at 1.02. On the left 1.23 and 1.00 on the left Past medical history; is not noted not a diabetic. She has gastroesophageal reflux disease, high cholesterol, hyperlipidemia, hypertension, hypothyroidism, sleep apnea 04-07-2022 upon evaluation today patient appears to be doing well currently in regard to her wound. The lateral portion of her foot appears to be completely healed which is great news and I am very pleased in that regard. In regard to the medial portion of her foot and the heel location this is still open and does appear to  need some sharp debridement today. I discussed with her today that we are going to proceed with debridement at this point. She is in agreement with that plan. 04-14-2022 upon evaluation today patient appears to be doing well currently in regard to her wound. She has been tolerating the dressing changes without complication and fortunately I do not see any signs of active infection locally nor systemically at this time. She did have some area of dark discoloration where I used a silver nitrate last week because of this she felt like that the [facility] was doing worse for her and she quit using it just using Neosporin. With that being said right now I think she really needs to get back to using the [facility] and she needs to be using offloading shoe. 04-22-2022 upon evaluation today patient appears to be doing well currently in regard to the wounds on her heel which are showing signs of improvement and the actual wound is measuring smaller she does have some irritation around the wound, cleaned away some of this callus today. Fortunately I do not see any evidence of active infection locally nor systemically which is great news. 04-29-2022 upon evaluation today patient appears to be doing well currently in regard to her wound. She is measuring about the same but nonetheless is still showing signs of improvement there is a lot of the discoloration around the edge which is improving little by little as we debride this away as well. Fortunately I am very pleased with where things stand. 05-09-2022 upon evaluation today patient appears to be doing well currently in regard to her wound. This is actually doing well on the right heel unfortunately she has a new blister on the left heel she does not know when that occurred exactly and what happened. Fortunately there does not appear to be any signs of active infection locally nor systemically which is great news. Nonetheless this blister is going to need to  be removed so we get this feeling of movement in the [place], Renea Ee ([id number]) [id number] Page 4 of 5 right  direction. 05-16-2022 upon evaluation today patient appears to be doing well currently in regard to her wounds the left heel is healed the right heel is smaller and overall I think that removing in the right direction. 05-23-2022 upon evaluation today patient appears to be doing well currently in regard to her wound which actually appears to be completely healed bilaterally. Fortunately there does not appear to be any signs of active infection locally or systemically at this time. No fevers, chills, nausea, vomiting, or diarrhea. Objective Constitutional Well-nourished and well-hydrated in no acute distress. Vitals Time Taken: 8:09 AM, Height: 61 in, Weight: 185 lbs, BMI: 35, Temperature: 98 F, Pulse: 68 bpm, Respiratory Rate: 18 breaths/min, Blood Pressure: 129/77 mmHg. Respiratory normal breathing without difficulty. Psychiatric this patient is able to make decisions and demonstrates good insight into disease process. Alert and Oriented x 3. pleasant and cooperative. General Notes: Upon inspection patient's wound bed actually showed signs of good granulation and epithelization at this point. Fortunately I do not see any signs of active infection locally nor systemically which is great news and overall I am extremely pleased with where we stand today. Integumentary (Hair, Skin) Wound #1 status is Healed - Epithelialized. Original cause of wound was Pressure Injury. The date acquired was: 01/13/2022. The wound has been in treatment 8 weeks. The wound is located on the Right,Posterior Calcaneus. The wound measures 0cm length x 0cm width x 0cm depth; 0cm^2 area and 0cm^3 volume. There is no tunneling or undermining noted. There is a none present amount of drainage noted. There is no granulation within the wound bed. There is no necrotic tissue within the  wound bed. Assessment Active Problems ICD-10 Other idiopathic peripheral autonomic neuropathy Pressure ulcer of right heel, stage 3 Pressure ulcer of left heel, stage 3 Plan Discharge From [facility] Services: Discharge from Wound Care Center Treatment Complete - apply 40 percent urea cream daily to start on 05/30/22, apply sock over cream in the pm, next morning wash foot with cloth, then apply regular lotion, repeat times 1 month 1. I would recommend that we have the patient continue to monitor for any signs of infection or worsening. Based on what I am seeing I do believe that we are moving in the right direction here. 2. I am going to suggest as well that the patient should continue with the recommendation for keeping pressure and friction off of the heels I think wearing socks at all time is the way to go. 3. Have also recommended the 40% urea cream for her to be used at bedtime with an aloe soft and then during the day she should be using just a regular sock with lotion. The only thing she should "scraped" her foot with a washcloth thing more. Nothing more. We will see patient back for reevaluation in 1 week here in the clinic. If anything worsens or changes patient will contact our office for additional recommendations. Electronic Signature(s) Signed: 05/23/2022 8:35:16 AM By: Allen Derry PA-C Entered By: Allen Derry on 05/23/2022 08:35:16 [name] ([id number]) [id number] Page 5 of 5 -------------------------------------------------------------------------------- SuperBill Details Patient Name: Date of Service: Jennifer [name] 05/23/2022 Medical Record Number: [id number] Patient Account Number: [id number] Date of Birth/Sex: Treating RN: 1950-04-13 (72 y.o. Freddy Finner Primary Care Provider: [name] Other Clinician: Referring Provider: Treating Provider/Extender: [name] in Treatment: 8 Diagnosis  Coding ICD-10 Codes Code Description G90.09 Other idiopathic peripheral autonomic neuropathy L89.613 Pressure ulcer of right heel, stage 3 L89.623 Pressure  ulcer of left heel, stage 3 Facility Procedures : CPT4 Code: [id number] Description: [phone] - WOUND CARE VISIT-LEV 2 EST PT Modifier: Quantity: 1 Physician Procedures : CPT4 Code Description Modifier [id number] - WC PHYS LEVEL 3 - EST PT ICD-10 Diagnosis Description G90.09 Other idiopathic peripheral autonomic neuropathy L89.613 Pressure ulcer of right heel, stage 3 L89.623 Pressure ulcer of left heel, stage 3 Quantity: 1 Electronic Signature(s) Signed: 05/23/2022 8:36:36 AM By: Allen Derry PA-C Entered By: Allen Derry on 05/23/2022 08:36:36

## 2022-05-31 ENCOUNTER — Ambulatory Visit: Payer: Medicare Other | Admitting: Neurosurgery

## 2022-06-01 DIAGNOSIS — I890 Lymphedema, not elsewhere classified: Secondary | ICD-10-CM | POA: Diagnosis not present

## 2022-06-01 DIAGNOSIS — G4733 Obstructive sleep apnea (adult) (pediatric): Secondary | ICD-10-CM | POA: Diagnosis not present

## 2022-06-01 DIAGNOSIS — G4700 Insomnia, unspecified: Secondary | ICD-10-CM | POA: Diagnosis not present

## 2022-06-01 DIAGNOSIS — E785 Hyperlipidemia, unspecified: Secondary | ICD-10-CM | POA: Diagnosis not present

## 2022-06-01 DIAGNOSIS — G6289 Other specified polyneuropathies: Secondary | ICD-10-CM | POA: Diagnosis not present

## 2022-06-01 DIAGNOSIS — R001 Bradycardia, unspecified: Secondary | ICD-10-CM | POA: Diagnosis not present

## 2022-06-01 DIAGNOSIS — I10 Essential (primary) hypertension: Secondary | ICD-10-CM | POA: Diagnosis not present

## 2022-06-01 DIAGNOSIS — K219 Gastro-esophageal reflux disease without esophagitis: Secondary | ICD-10-CM | POA: Diagnosis not present

## 2022-06-07 DIAGNOSIS — F331 Major depressive disorder, recurrent, moderate: Secondary | ICD-10-CM | POA: Diagnosis not present

## 2022-06-07 DIAGNOSIS — Z79899 Other long term (current) drug therapy: Secondary | ICD-10-CM | POA: Diagnosis not present

## 2022-06-09 ENCOUNTER — Telehealth: Payer: Self-pay | Admitting: Family

## 2022-06-09 NOTE — Telephone Encounter (Signed)
Pt called in stating that she didn't hear about her GI referral, as per her last visit with provider.

## 2022-06-10 ENCOUNTER — Ambulatory Visit: Payer: Medicare Other | Admitting: Family

## 2022-06-10 NOTE — Telephone Encounter (Signed)
Pt did not answer phone call.  Left a message with contact information for the GI office.   Referring To Provider Information Regis Bill [address] [place] [address] [phone]

## 2022-06-20 ENCOUNTER — Ambulatory Visit (INDEPENDENT_AMBULATORY_CARE_PROVIDER_SITE_OTHER): Payer: Medicare Other | Admitting: Family

## 2022-06-20 ENCOUNTER — Encounter: Payer: Self-pay | Admitting: Family

## 2022-06-20 VITALS — BP 128/76 | HR 75 | Temp 97.9°F | Ht 61.0 in | Wt 183.8 lb

## 2022-06-20 DIAGNOSIS — F1090 Alcohol use, unspecified, uncomplicated: Secondary | ICD-10-CM | POA: Diagnosis not present

## 2022-06-20 NOTE — Progress Notes (Signed)
Assessment & Plan:  Alcohol use disorder Assessment & Plan: Improved, uncontrolled.  Patient and  I discussed local options.  I referred her to [facility] for substance abuse, depression.  Will follow.  Orders: -     Ambulatory referral to Psychiatry     Return precautions given.   Risks, benefits, and alternatives of the medications and treatment plan prescribed today were discussed, and patient expressed understanding.   Education regarding symptom management and diagnosis given to patient on AVS either electronically or printed.  Return in about 1 month (around 07/20/2022).  [name], FNP  Subjective:    Patient ID: Jennifer [name], female    DOB: 1950-09-20, 72 y.o.   MRN: [id number]  CC: Jennifer [name] is a 72 y.o. female who presents today for follow up.   HPI: She is feeling better today.  Things at home are better and that she tends to avoid her husband for avoid  verbal conflict.  She is drinking less alcohol. She drinks 2 glasses of wine per day.   She has seen [facility], twice with a counselor and once with psychiatry.  She did not start the medications prescribed (unsure of names)   she found treatment helpful however she would like to establish care with a local treatment team.     Compliant with losartan 100 mg daily.  She is no longer on triamterene hydrochlorothiazide Follow-up Dr. Juliann Pares 06/01/2022 Follow-up Dr. Cathie Hoops 07/06/2022  Allergies: Bee pollen, Celecoxib, Hydrocodone, Pollen extract, Sodium ferric gluconate [ferrous gluconate], Nasacort [triamcinolone], and Vicodin [hydrocodone-acetaminophen] Current Outpatient Medications on File Prior to Visit  Medication Sig Dispense Refill   acetaminophen (TYLENOL) 325 MG tablet Take 2 tablets (650 mg total) by mouth every 6 (six) hours as needed for mild pain (or Fever >/= 101).     acyclovir (ZOVIRAX) 400 MG tablet Take 1 tablet (400 mg total) by mouth daily. 30 tablet 5   fluticasone (FLONASE)  50 MCG/ACT nasal spray SPRAY 1 SPRAY INTO BOTH NOSTRILS DAILY. 16 mL 2   folic acid (FOLVITE) 1 MG tablet Take 1 tablet (1 mg total) by mouth daily. 360 tablet 0   hydrocortisone (ANUSOL-HC) 25 MG suppository Unwrap and insert 1 suppository rectally twice a day 12 suppository 1   levothyroxine (SYNTHROID) 50 MCG tablet TAKE 1 TABLET (50 MCG TOTAL) BY MOUTH DAILY. DX CODE E03.9 90 tablet 1   losartan (COZAAR) 100 MG tablet TAKE 1 TABLET BY MOUTH EVERYDAY AT BEDTIME (Patient taking differently: Take 100 mg by mouth daily. TAKE 1 TABLET BY MOUTH EVERYDAY AT BEDTIME) 90 tablet 3   Multiple Vitamin (MULTIVITAMIN WITH MINERALS) TABS tablet Take 1 tablet by mouth daily.     pantoprazole (PROTONIX) 40 MG tablet Take 1 tablet (40 mg total) by mouth 2 (two) times daily. 180 tablet 3   sodium chloride (OCEAN) 0.65 % SOLN nasal spray Place 2 sprays into both nostrils as needed for congestion. 30 mL 2   traZODone (DESYREL) 50 MG tablet for sleep DX Code G47.00 (Patient taking differently: Take 50 mg by mouth at bedtime. for sleep DX Code G47.00) 90 tablet 3   No current facility-administered medications on file prior to visit.    Review of Systems  Constitutional:  Negative for chills and fever.  Respiratory:  Negative for cough.   Cardiovascular:  Negative for chest pain and palpitations.  Gastrointestinal:  Negative for nausea and vomiting.      Objective:    BP 128/76   Pulse 75  Temp 97.9 F (36.6 C) (Oral)   Ht 5\' 1"  (1.549 m)   Wt 183 lb 12.8 oz (83.4 kg)   SpO2 98%   BMI 34.73 kg/m  BP Readings from Last 3 Encounters:  06/20/22 128/76  05/18/22 118/70  05/05/22 (!) 148/70   Wt Readings from Last 3 Encounters:  06/20/22 183 lb 12.8 oz (83.4 kg)  05/18/22 179 lb 6.4 oz (81.4 kg)  05/03/22 180 lb 8.9 oz (81.9 kg)    Physical Exam Vitals reviewed.  Constitutional:      Appearance: She is well-developed.  Eyes:     Conjunctiva/sclera: Conjunctivae normal.  Cardiovascular:      Rate and Rhythm: Normal rate and regular rhythm.     Pulses: Normal pulses.     Heart sounds: Normal heart sounds.  Pulmonary:     Effort: Pulmonary effort is normal.     Breath sounds: Normal breath sounds. No wheezing, rhonchi or rales.  Skin:    General: Skin is warm and dry.  Neurological:     Mental Status: She is alert.  Psychiatric:        Speech: Speech normal.        Behavior: Behavior normal.        Thought Content: Thought content normal.

## 2022-06-20 NOTE — Patient Instructions (Addendum)
Please call [facility] behavioral [facility] to schedule an appointment; their office is in [place].   [address] [place], [place] [address] [phone]  Please also call [facility], Dr Mart Piggs office; I have placed a referral during our  last visit.     [phone]   Let me know if any issues in scheduling.

## 2022-06-20 NOTE — Assessment & Plan Note (Signed)
Improved, uncontrolled.  Patient and  I discussed local options.  I referred her to [facility] for substance abuse, depression.  Will follow.

## 2022-06-30 ENCOUNTER — Other Ambulatory Visit: Payer: Self-pay | Admitting: Family

## 2022-06-30 DIAGNOSIS — F419 Anxiety disorder, unspecified: Secondary | ICD-10-CM

## 2022-07-06 ENCOUNTER — Inpatient Hospital Stay: Payer: Medicare Other | Attending: Oncology

## 2022-07-06 DIAGNOSIS — D500 Iron deficiency anemia secondary to blood loss (chronic): Secondary | ICD-10-CM | POA: Insufficient documentation

## 2022-07-06 DIAGNOSIS — Z9884 Bariatric surgery status: Secondary | ICD-10-CM | POA: Diagnosis not present

## 2022-07-06 LAB — IRON AND TIBC
Iron: 55 ug/dL (ref 28–170)
Saturation Ratios: 16 % (ref 10.4–31.8)
TIBC: 339 ug/dL (ref 250–450)
UIBC: 284 ug/dL

## 2022-07-06 LAB — CBC WITH DIFFERENTIAL/PLATELET
Abs Immature Granulocytes: 0.01 10*3/uL (ref 0.00–0.07)
Basophils Absolute: 0 10*3/uL (ref 0.0–0.1)
Basophils Relative: 0 %
Eosinophils Absolute: 0 10*3/uL (ref 0.0–0.5)
Eosinophils Relative: 1 %
HCT: 26.2 % — ABNORMAL LOW (ref 36.0–46.0)
Hemoglobin: 8.6 g/dL — ABNORMAL LOW (ref 12.0–15.0)
Immature Granulocytes: 0 %
Lymphocytes Relative: 29 %
Lymphs Abs: 1.5 10*3/uL (ref 0.7–4.0)
MCH: 26.7 pg (ref 26.0–34.0)
MCHC: 32.8 g/dL (ref 30.0–36.0)
MCV: 81.4 fL (ref 80.0–100.0)
Monocytes Absolute: 0.6 10*3/uL (ref 0.1–1.0)
Monocytes Relative: 12 %
Neutro Abs: 3 10*3/uL (ref 1.7–7.7)
Neutrophils Relative %: 58 %
Platelets: 163 10*3/uL (ref 150–400)
RBC: 3.22 MIL/uL — ABNORMAL LOW (ref 3.87–5.11)
RDW: 22.9 % — ABNORMAL HIGH (ref 11.5–15.5)
WBC: 5.2 10*3/uL (ref 4.0–10.5)
nRBC: 0 % (ref 0.0–0.2)

## 2022-07-06 LAB — FERRITIN: Ferritin: 50 ng/mL (ref 11–307)

## 2022-07-06 LAB — FOLATE: Folate: 15.3 ng/mL (ref >5.9)

## 2022-07-06 LAB — VITAMIN B12: Vitamin B-12: 2323 pg/mL — ABNORMAL HIGH (ref 180–914)

## 2022-07-13 ENCOUNTER — Ambulatory Visit: Payer: Medicare Other | Admitting: Oncology

## 2022-07-13 ENCOUNTER — Inpatient Hospital Stay: Payer: Medicare Other

## 2022-07-13 ENCOUNTER — Inpatient Hospital Stay (HOSPITAL_BASED_OUTPATIENT_CLINIC_OR_DEPARTMENT_OTHER): Payer: Medicare Other | Admitting: Oncology

## 2022-07-13 ENCOUNTER — Encounter: Payer: Self-pay | Admitting: Oncology

## 2022-07-13 VITALS — BP 120/69 | HR 62 | Temp 98.6°F | Resp 18 | Wt 174.4 lb

## 2022-07-13 VITALS — BP 115/71

## 2022-07-13 DIAGNOSIS — D500 Iron deficiency anemia secondary to blood loss (chronic): Secondary | ICD-10-CM | POA: Diagnosis not present

## 2022-07-13 DIAGNOSIS — Z9884 Bariatric surgery status: Secondary | ICD-10-CM

## 2022-07-13 DIAGNOSIS — D62 Acute posthemorrhagic anemia: Secondary | ICD-10-CM

## 2022-07-13 MED ORDER — SODIUM CHLORIDE 0.9 % IV SOLN
INTRAVENOUS | Status: DC
  Filled 2022-07-13: qty 250

## 2022-07-13 MED ORDER — SODIUM CHLORIDE 0.9 % IV SOLN
200.0000 mg | Freq: Once | INTRAVENOUS | Status: AC
  Administered 2022-07-13: 200 mg via INTRAVENOUS
  Filled 2022-07-13: qty 200

## 2022-07-13 NOTE — Assessment & Plan Note (Signed)
Check iron panel, B12, folate periodically.  

## 2022-07-13 NOTE — Progress Notes (Signed)
Pt here for follow up. Reports that she had a drinking problem but has quit, she is seeing a therapist. It has been 3 weeks since she had her last drink

## 2022-07-13 NOTE — Patient Instructions (Signed)

## 2022-07-13 NOTE — Progress Notes (Signed)
Hematology/Oncology Progress note [phone] [phone]         Patient Care Team: Allegra Grana, FNP as PCP - General (Family Medicine) Linna Darner, RD as Dietitian (Family Medicine) Rickard Patience, MD as Consulting Physician (Hematology and Oncology)  ASSESSMENT & PLAN:   IDA (iron deficiency anemia) #Iron deficiency anemia due to chronic blood loss. Labs reviewed and discussed with patient. Lab Results  Component Value Date   HGB 8.6 (L) 07/06/2022   TIBC 339 07/06/2022   IRONPCTSAT 16 07/06/2022   FERRITIN 50 07/06/2022   Hemoglobin is worse, due to recent episode of GI bleeding.  history of gastric bypass Recommend patient to get additional IV Venofer treatment x3   History of bariatric surgery Check iron panel, B12, folate periodically.    Orders Placed This Encounter  Procedures   CMP (Cancer Center only)    Standing Status:   Future    Standing Expiration Date:   07/13/2023   CBC with Differential (Cancer Center Only)    Standing Status:   Future    Standing Expiration Date:   07/13/2023   Iron and TIBC    Standing Status:   Future    Standing Expiration Date:   07/13/2023   Ferritin    Standing Status:   Future    Standing Expiration Date:   07/13/2023   Retic Panel    Standing Status:   Future    Standing Expiration Date:   07/13/2023   Follow up in  2 months All questions were answered. The patient knows to call the clinic with any problems, questions or concerns.  Rickard Patience, MD, PhD [facility] Hematology Oncology 07/13/2022   CHIEF COMPLAINTS/REASON FOR VISIT:  Follow-up for iron deficiency anemia.  HISTORY OF PRESENTING ILLNESS:   Jennifer [name] is a  72 y.o.  female with PMH listed below was seen in consultation at the request of  Allegra Grana, FNP  for evaluation of iron deficiency anemia  Patient was admitted from 02/05/2021 - 02/06/2021 due to symptomatic anemia with bright red blood per rectum.  Initial  hemoglobin at presentation was 5.9.  Patient was transfused with 2 units of PRBC.  Also received 1 dose of IV Venofer treatments.  Patient had a flexible sigmoidoscopy by Dr. Timothy Lasso.  Findings of nonbleeding hemorrhoids.  Patient follows up with Dr. Tobi Bastos outpatient.  Patient was previously taking aspirin which has been discontinued.  Patient had hospitalization from 11/17/2020 - 11/19/2020 due to symptomatic anemia secondary to intermittent BPBPR/melena.  She received blood transfusion during that hospitalization.  She also had a reaction to ferrous gluconate. She had EGD and colonoscopy on 11/19/2020 with no notable findings of active bleeding.   Patient has a history of bariatric surgery.  INTERVAL HISTORY Jennifer [name] is a 72 y.o. female who has above history reviewed by me today presents for follow up visit for iron deficiency During the interval she was admitted due to melena. Acute blood loss anemia. underwent CT angio GI bleed with no active GI hemorrhage but liver steatosis.  status post upper and lower endoscopy. Lower endoscopy showed internal hemorrhoids and upper endoscopy was within normal limits.   She has stopped drinking alcohol for 3 weeks.     Review of Systems  Constitutional:  Positive for fatigue. Negative for appetite change, chills and fever.  HENT:   Negative for hearing loss and voice change.   Eyes:  Negative for eye problems.  Respiratory:  Negative for chest tightness  and cough.   Cardiovascular:  Negative for chest pain.  Gastrointestinal:  Positive for blood in stool. Negative for abdominal distention and abdominal pain.  Endocrine: Negative for hot flashes.  Genitourinary:  Negative for difficulty urinating and frequency.   Musculoskeletal:  Negative for arthralgias.  Skin:  Negative for itching and rash.  Neurological:  Negative for extremity weakness.  Hematological:  Negative for adenopathy.  Psychiatric/Behavioral:  Negative for confusion.      MEDICAL HISTORY:  Past Medical History:  Diagnosis Date   Anemia    Anxiety    Bariatric surgery status    Complication of anesthesia    Diificulty breathing for about 15 minutes after bariatric surgery   Constipation    COVID-19    Dysrhythmia    Elevated liver enzymes    GERD (gastroesophageal reflux disease)    Hemorrhoids    Herpes genitalis    High cholesterol    Hyperlipidemia    Hypertension    Hypothyroidism    IDA (iron deficiency anemia) 02/09/2021   Neuropathy    Osteoarthritis    Sleep apnea    CPAP    SURGICAL HISTORY: Past Surgical History:  Procedure Laterality Date   ABDOMINAL HYSTERECTOMY     total for fibroids no h/o abnormal pap   bariatric sleeve  2015   BREAST EXCISIONAL BIOPSY Left 1998   carpal tunnel repair     CATARACT EXTRACTION W/PHACO Right 11/01/2021   Procedure: CATARACT EXTRACTION PHACO AND INTRAOCULAR LENS PLACEMENT (IOC) RIGHT;  Surgeon: Nevada Crane, MD;  Location: [facility];  Service: Ophthalmology;  Laterality: Right;  sleep apnea 4.95 00:49.7   CATARACT EXTRACTION W/PHACO Left 11/15/2021   Procedure: CATARACT EXTRACTION PHACO AND INTRAOCULAR LENS PLACEMENT (IOC) LEFT 4.94 00:32.3;  Surgeon: Nevada Crane, MD;  Location: [facility];  Service: Ophthalmology;  Laterality: Left;  sleep apnea   COLONOSCOPY WITH PROPOFOL N/A 02/11/2016   Procedure: COLONOSCOPY WITH PROPOFOL;  Surgeon: Wyline Mood, MD;  Location: [facility];  Service: Endoscopy;  Laterality: N/A;   COLONOSCOPY WITH PROPOFOL N/A 10/01/2019   Procedure: COLONOSCOPY WITH PROPOFOL;  Surgeon: Wyline Mood, MD;  Location: [facility] END[facility]COPY;  Service: Gastroenterology;  Laterality: N/A;   COLONOSCOPY WITH PROPOFOL N/A 11/19/2020   Procedure: COLONOSCOPY WITH PROPOFOL;  Surgeon: Wyline Mood, MD;  Location: [facility] END[facility]COPY;  Service: Gastroenterology;  Laterality: N/A;   COLONOSCOPY WITH PROPOFOL N/A 05/05/2022   Procedure: COLONOSCOPY WITH PROPOFOL;   Surgeon: Regis Bill, MD;  Location: [facility] END[facility]COPY;  Service: Endoscopy;  Laterality: N/A;   ESOPHAGOGASTRODUODENOSCOPY (EGD) WITH PROPOFOL N/A 12/02/2019   Procedure: ESOPHAGOGASTRODUODENOSCOPY (EGD) WITH PROPOFOL;  Surgeon: Wyline Mood, MD;  Location: [facility] ENDOSCOPY;  Service: Gastroenterology;  Laterality: N/A;   ESOPHAGOGASTRODUODENOSCOPY (EGD) WITH PROPOFOL N/A 11/19/2020   Procedure: ESOPHAGOGASTRODUODENOSCOPY (EGD) WITH PROPOFOL;  Surgeon: Wyline Mood, MD;  Location: [facility] ENDOSCOPY;  Service: Gastroenterology;  Laterality: N/A;   ESOPHAGOGASTRODUODENOSCOPY (EGD) WITH PROPOFOL N/A 05/05/2022   Procedure: ESOPHAGOGASTRODUODENOSCOPY (EGD) WITH PROPOFOL;  Surgeon: Regis Bill, MD;  Location: [facility] END[facility]COPY;  Service: Endoscopy;  Laterality: N/A;   FLEXIBLE SIGMOIDOSCOPY N/A 02/06/2021   Procedure: FLEXIBLE SIGMOIDOSCOPY;  Surgeon: Jaynie Collins, DO;  Location: [facility] ENDOSCOPY;  Service: Gastroenterology;  Laterality: N/A;   GIVENS CAPSULE STUDY N/A 06/07/2021   Procedure: GIVENS CAPSULE STUDY;  Surgeon: Toney Reil, MD;  Location: [facility] ENDOSCOPY;  Service: Gastroenterology;  Laterality: N/A;   GIVENS CAPSULE STUDY N/A 06/09/2021   Procedure: GIVENS CAPSULE STUDY;  Surgeon: Lannette Donath  Betti Cruz, MD;  Location: [facility] ENDOSCOPY;  Service: Gastroenterology;  Laterality: N/A;   HEMORRHOID SURGERY     LAPAROSCOPIC GASTRIC RESTRICTIVE DUODENAL PROCEDURE (DUODENAL SWITCH) Bilateral    2020    SOCIAL HISTORY: Social History   Socioeconomic History   Marital status: Married    Spouse name: Not on file   Number of children: Not on file   Years of education: Not on file   Highest education level: Some college, no degree  Occupational History   Not on file  Tobacco Use   Smoking status: Former    Packs/day: 1.50    Years: 24.00    Additional pack years: 0.00    Total pack years: 36.00    Types: Cigarettes    Quit date: 98    Years since quitting: 31.5    Smokeless tobacco: Never   Tobacco comments:    quit 1995.   Vaping Use   Vaping Use: Never used  Substance and Sexual Activity   Alcohol use: Not Currently    Alcohol/week: 14.0 standard drinks of alcohol    Types: 14 Glasses of wine per week   Drug use: No   Sexual activity: Not Currently    Birth control/protection: Surgical    Comment: Hysterectomy  Other Topics Concern   Not on file  Social History Narrative   Lives in [place].    Married.    Retired 2015, Engineer, structural.    One son; granddaughter.    Left handed    Caffeine- decaf coffee.    Social Determinants of Health   Financial Resource Strain: Low Risk  (05/17/2022)   Overall Financial Resource Strain (CARDIA)    Difficulty of Paying Living Expenses: Not hard at all  Food Insecurity: No Food Insecurity (05/17/2022)   Hunger Vital Sign    Worried About Running Out of Food in the Last Year: Never true    Ran Out of Food in the Last Year: Never true  Transportation Needs: No Transportation Needs (05/17/2022)   PRAPARE - Administrator, Civil Service (Medical): No    Lack of Transportation (Non-Medical): No  Physical Activity: Unknown (05/17/2022)   Exercise Vital Sign    Days of Exercise per Week: 0 days    Minutes of Exercise per Session: Not on file  Stress: Stress Concern Present (05/17/2022)   [employer] of Occupational Health - Occupational Stress Questionnaire    Feeling of Stress : To some extent  Social Connections: Unknown (05/17/2022)   Social Connection and Isolation Panel [NHANES]    Frequency of Communication with Friends and Family: Twice a week    Frequency of Social Gatherings with Friends and Family: Patient declined    Attends Religious Services: Patient declined    Database administrator or Organizations: Patient declined    Attends Banker Meetings: Not on file    Marital Status: Married  Intimate Partner Violence: Not At Risk (05/04/2022)    Humiliation, Afraid, Rape, and Kick questionnaire    Fear of Current or Ex-Partner: No    Emotionally Abused: No    Physically Abused: No    Sexually Abused: No    FAMILY HISTORY: Family History  Problem Relation Age of Onset   Hypertension Mother    Heart disease Father    Alcohol abuse Father    Breast cancer Sister 37       materal 1/2 sister   Hypertension Sister    Hypertension Brother  ALLERGIES:  is allergic to bee pollen, celecoxib, hydrocodone, pollen extract, sodium ferric gluconate [ferrous gluconate], nasacort [triamcinolone], and vicodin [hydrocodone-acetaminophen].  MEDICATIONS:  Current Outpatient Medications  Medication Sig Dispense Refill   acetaminophen (TYLENOL) 325 MG tablet Take 2 tablets (650 mg total) by mouth every 6 (six) hours as needed for mild pain (or Fever >/= 101).     acyclovir (ZOVIRAX) 400 MG tablet Take 1 tablet (400 mg total) by mouth daily. 30 tablet 5   fluticasone (FLONASE) 50 MCG/ACT nasal spray SPRAY 1 SPRAY INTO BOTH NOSTRILS DAILY. 16 mL 2   folic acid (FOLVITE) 1 MG tablet Take 1 tablet (1 mg total) by mouth daily. 360 tablet 0   hydrocortisone (ANUSOL-HC) 25 MG suppository Unwrap and insert 1 suppository rectally twice a day 12 suppository 1   levothyroxine (SYNTHROID) 50 MCG tablet TAKE 1 TABLET (50 MCG TOTAL) BY MOUTH DAILY. DX CODE E03.9 90 tablet 1   losartan (COZAAR) 100 MG tablet TAKE 1 TABLET BY MOUTH EVERYDAY AT BEDTIME (Patient taking differently: Take 100 mg by mouth daily. TAKE 1 TABLET BY MOUTH EVERYDAY AT BEDTIME) 90 tablet 3   Multiple Vitamin (MULTIVITAMIN WITH MINERALS) TABS tablet Take 1 tablet by mouth daily.     pantoprazole (PROTONIX) 40 MG tablet Take 1 tablet (40 mg total) by mouth 2 (two) times daily. 180 tablet 3   sodium chloride (OCEAN) 0.65 % SOLN nasal spray Place 2 sprays into both nostrils as needed for congestion. 30 mL 2   traZODone (DESYREL) 50 MG tablet TAKE 1 TABLET BY MOUTH EVERY DAY FOR SLEEP DX  CODE G47.00 90 tablet 3   No current facility-administered medications for this visit.     PHYSICAL EXAMINATION: ECOG PERFORMANCE STATUS: 1 - Symptomatic but completely ambulatory Vitals:   07/13/22 1321  BP: 120/69  Pulse: 62  Resp: 18  Temp: 98.6 F (37 C)   Filed Weights   07/13/22 1321  Weight: 174 lb 6.4 oz (79.1 kg)    Physical Exam Constitutional:      General: She is not in acute distress. HENT:     Head: Normocephalic and atraumatic.  Eyes:     General: No scleral icterus. Cardiovascular:     Rate and Rhythm: Normal rate and regular rhythm.     Heart sounds: Normal heart sounds.  Pulmonary:     Effort: Pulmonary effort is normal. No respiratory distress.     Breath sounds: No wheezing.  Abdominal:     General: Bowel sounds are normal. There is no distension.     Palpations: Abdomen is soft.  Musculoskeletal:        General: No deformity. Normal range of motion.     Cervical back: Normal range of motion and neck supple.  Skin:    General: Skin is warm and dry.     Findings: No erythema or rash.  Neurological:     Mental Status: She is alert and oriented to person, place, and time. Mental status is at baseline.     Cranial Nerves: No cranial nerve deficit.     Coordination: Coordination normal.  Psychiatric:        Mood and Affect: Mood normal.     LABORATORY DATA:  I have reviewed the data as listed    Latest Ref Rng & Units 07/06/2022    8:45 AM 05/05/2022    5:04 PM 05/05/2022    3:50 AM  CBC  WBC 4.0 - 10.5 K/uL 5.2  Hemoglobin 12.0 - 15.0 g/dL 8.6  8.9  7.9   Hematocrit 36.0 - 46.0 % 26.2  27.2  23.7   Platelets 150 - 400 K/uL 163         Latest Ref Rng & Units 05/05/2022    3:50 AM 05/04/2022    4:01 AM 05/03/2022    8:32 PM  CMP  Glucose 70 - 99 mg/dL 87  99    BUN 8 - 23 mg/dL 8  12    Creatinine 3.61 - 1.00 mg/dL 4.43  1.54    Sodium 008 - 145 mmol/L 140  138    Potassium 3.5 - 5.1 mmol/L 3.3  4.0    Chloride 98 - 111 mmol/L 111   106    CO2 22 - 32 mmol/L 25  25    Calcium 8.9 - 10.3 mg/dL 7.8  8.1    Total Protein 6.5 - 8.1 g/dL   6.0   Total Bilirubin 0.3 - 1.2 mg/dL   1.3   Alkaline Phos 38 - 126 U/L   175   AST 15 - 41 U/L   300   ALT 0 - 44 U/L   59     Lab Results  Component Value Date   IRON 55 07/06/2022   TIBC 339 07/06/2022   FERRITIN 50 07/06/2022     RADIOGRAPHIC STUDIES: I have personally reviewed the radiological images as listed and agreed with the findings in the report. No results found.

## 2022-07-13 NOTE — Assessment & Plan Note (Addendum)
#  Iron deficiency anemia due to chronic blood loss. Labs reviewed and discussed with patient. Lab Results  Component Value Date   HGB 8.6 (L) 07/06/2022   TIBC 339 07/06/2022   IRONPCTSAT 16 07/06/2022   FERRITIN 50 07/06/2022   Hemoglobin is worse, due to recent episode of GI bleeding.  history of gastric bypass Recommend patient to get additional IV Venofer treatment x3

## 2022-07-20 ENCOUNTER — Inpatient Hospital Stay: Payer: Medicare Other | Attending: Oncology

## 2022-07-20 VITALS — BP 105/51 | HR 50 | Temp 96.2°F | Resp 18

## 2022-07-20 DIAGNOSIS — D500 Iron deficiency anemia secondary to blood loss (chronic): Secondary | ICD-10-CM | POA: Diagnosis not present

## 2022-07-20 DIAGNOSIS — D62 Acute posthemorrhagic anemia: Secondary | ICD-10-CM

## 2022-07-20 MED ORDER — SODIUM CHLORIDE 0.9 % IV SOLN
INTRAVENOUS | Status: DC
  Filled 2022-07-20: qty 250

## 2022-07-20 MED ORDER — SODIUM CHLORIDE 0.9 % IV SOLN
200.0000 mg | Freq: Once | INTRAVENOUS | Status: AC
  Administered 2022-07-20: 200 mg via INTRAVENOUS
  Filled 2022-07-20: qty 200

## 2022-07-20 NOTE — Progress Notes (Signed)
Patient declined to wait the 30 minutes for post iron infusion observation today. Tolerated infusion well. VSS. 

## 2022-07-22 ENCOUNTER — Encounter: Payer: Self-pay | Admitting: Family Medicine

## 2022-07-22 ENCOUNTER — Ambulatory Visit (INDEPENDENT_AMBULATORY_CARE_PROVIDER_SITE_OTHER): Payer: Medicare Other | Admitting: Family Medicine

## 2022-07-22 VITALS — BP 120/70 | HR 58 | Temp 98.5°F | Ht 61.0 in | Wt 175.2 lb

## 2022-07-22 DIAGNOSIS — A6004 Herpesviral vulvovaginitis: Secondary | ICD-10-CM

## 2022-07-22 DIAGNOSIS — T148XXA Other injury of unspecified body region, initial encounter: Secondary | ICD-10-CM | POA: Insufficient documentation

## 2022-07-22 MED ORDER — FLUTICASONE PROPIONATE 50 MCG/ACT NA SUSP: NASAL | 2 refills | Status: DC

## 2022-07-22 MED ORDER — ACYCLOVIR 400 MG PO TABS
400.0000 mg | ORAL_TABLET | Freq: Every day | ORAL | 5 refills | Status: DC
Start: 2022-07-22 — End: 2022-12-13

## 2022-07-22 NOTE — Assessment & Plan Note (Signed)
The area on her toe appears to be a friction blister with likely serosanguineous fluid inside.  Does not appear to be infected at this time.  It is nontender.  She notes no pain at the site of the blister.  Given this we will not drain it today.  Discussed that we will likely end up draining on its own.  Discussed that the roof of the blister should remain in place to provide a biologic dressing.  Advised to monitor for spreading redness, drainage of pus, or increasing pain in her toe as these could indicate infection and then the roof of the blister would need to be removed.  Did discuss having her follow-up with her podiatrist as well for this issue.  Advised to cleanse with soap and water.  She wondered if she could use alcohol and I advised that that would be okay to do on occasion.  Discussed not using hydrogen peroxide.  Discussed not submerging her toe.

## 2022-07-22 NOTE — Progress Notes (Signed)
Marikay Alar, MD Phone: [phone]  Jennifer [name] is a 72 y.o. female who presents today for same-day visit.  Right great toe swelling: Patient notes this started about a week ago.  It is on the inside of her right great toe.  She notes prior to this occurring she wore some close toed shoes and wonders if that played a role.  She has no fever.  It did drain some clearish reddish liquid when she poked it at 1 point.  Notes no pain at the site of the blister.  Notes shooting pain that is chronic.  Does note bilateral neuropathy pain and takes gabapentin and Tylenol for that.  She wonders if there is anything topically she can use for that.  Social History   Tobacco Use  Smoking Status Former   Packs/day: 1.50   Years: 24.00   Additional pack years: 0.00   Total pack years: 36.00   Types: Cigarettes   Quit date: 1993   Years since quitting: 31.5  Smokeless Tobacco Never  Tobacco Comments   quit 1995.     Current Outpatient Medications on File Prior to Visit  Medication Sig Dispense Refill   acetaminophen (TYLENOL) 325 MG tablet Take 2 tablets (650 mg total) by mouth every 6 (six) hours as needed for mild pain (or Fever >/= 101).     folic acid (FOLVITE) 1 MG tablet Take 1 tablet (1 mg total) by mouth daily. 360 tablet 0   hydrocortisone (ANUSOL-HC) 25 MG suppository Unwrap and insert 1 suppository rectally twice a day 12 suppository 1   levothyroxine (SYNTHROID) 50 MCG tablet TAKE 1 TABLET (50 MCG TOTAL) BY MOUTH DAILY. DX CODE E03.9 90 tablet 1   losartan (COZAAR) 100 MG tablet TAKE 1 TABLET BY MOUTH EVERYDAY AT BEDTIME (Patient taking differently: Take 100 mg by mouth daily. TAKE 1 TABLET BY MOUTH EVERYDAY AT BEDTIME) 90 tablet 3   Multiple Vitamin (MULTIVITAMIN WITH MINERALS) TABS tablet Take 1 tablet by mouth daily.     pantoprazole (PROTONIX) 40 MG tablet Take 1 tablet (40 mg total) by mouth 2 (two) times daily. 180 tablet 3   sodium chloride (OCEAN) 0.65 % SOLN nasal  spray Place 2 sprays into both nostrils as needed for congestion. 30 mL 2   traZODone (DESYREL) 50 MG tablet TAKE 1 TABLET BY MOUTH EVERY DAY FOR SLEEP DX CODE G47.00 90 tablet 3   No current facility-administered medications on file prior to visit.     ROS see history of present illness  Objective  Physical Exam Vitals:   07/22/22 1041  BP: 120/70  Pulse: (!) 58  Temp: 98.5 F (36.9 C)  SpO2: 99%    BP Readings from Last 3 Encounters:  07/22/22 120/70  07/20/22 (!) 105/51  07/13/22 115/71   Wt Readings from Last 3 Encounters:  07/22/22 175 lb 3.2 oz (79.5 kg)  07/13/22 174 lb 6.4 oz (79.1 kg)  06/20/22 183 lb 12.8 oz (83.4 kg)    Physical Exam   Assessment/Plan: Please see individual problem list.  Friction blister Assessment & Plan: The area on her toe appears to be a friction blister with likely serosanguineous fluid inside.  Does not appear to be infected at this time.  It is nontender.  She notes no pain at the site of the blister.  Given this we will not drain it today.  Discussed that we will likely end up draining on its own.  Discussed that the roof of the blister should remain  in place to provide a biologic dressing.  Advised to monitor for spreading redness, drainage of pus, or increasing pain in her toe as these could indicate infection and then the roof of the blister would need to be removed.  Did discuss having her follow-up with her podiatrist as well for this issue.  Advised to cleanse with soap and water.  She wondered if she could use alcohol and I advised that that would be okay to do on occasion.  Discussed not using hydrogen peroxide.  Discussed not submerging her toe.   Herpes simplex vulvovaginitis -     Acyclovir; Take 1 tablet (400 mg total) by mouth daily.  Dispense: 30 tablet; Refill: 5  Other orders -     Fluticasone Propionate; SPRAY 1 SPRAY INTO BOTH NOSTRILS DAILY.  Dispense: 16 mL; Refill: 2    Return if symptoms worsen or fail to  improve.   Marikay Alar, MD [facility]

## 2022-07-22 NOTE — Patient Instructions (Signed)
Please let us know if your blister ruptures.  If you have redness that is spreading or drainage of puss please let us know.  Please get in touch with your podiatrist for you foot.

## 2022-07-25 DIAGNOSIS — F331 Major depressive disorder, recurrent, moderate: Secondary | ICD-10-CM | POA: Diagnosis not present

## 2022-07-25 DIAGNOSIS — Z79899 Other long term (current) drug therapy: Secondary | ICD-10-CM | POA: Diagnosis not present

## 2022-07-26 ENCOUNTER — Ambulatory Visit (INDEPENDENT_AMBULATORY_CARE_PROVIDER_SITE_OTHER): Payer: Medicare Other

## 2022-07-26 ENCOUNTER — Ambulatory Visit (INDEPENDENT_AMBULATORY_CARE_PROVIDER_SITE_OTHER): Payer: Medicare Other | Admitting: Podiatry

## 2022-07-26 DIAGNOSIS — E11621 Type 2 diabetes mellitus with foot ulcer: Secondary | ICD-10-CM

## 2022-07-26 DIAGNOSIS — M79671 Pain in right foot: Secondary | ICD-10-CM | POA: Diagnosis not present

## 2022-07-26 DIAGNOSIS — L97519 Non-pressure chronic ulcer of other part of right foot with unspecified severity: Secondary | ICD-10-CM

## 2022-07-26 MED ORDER — SILVER SULFADIAZINE 1 % EX CREA: 1.0000 | TOPICAL_CREAM | Freq: Every day | CUTANEOUS | 1 refills | Status: DC

## 2022-07-27 ENCOUNTER — Ambulatory Visit: Payer: Medicare Other

## 2022-07-28 ENCOUNTER — Inpatient Hospital Stay: Payer: Medicare Other

## 2022-07-28 VITALS — BP 123/51 | HR 53 | Temp 97.0°F | Resp 18

## 2022-07-28 DIAGNOSIS — D500 Iron deficiency anemia secondary to blood loss (chronic): Secondary | ICD-10-CM

## 2022-07-28 DIAGNOSIS — D62 Acute posthemorrhagic anemia: Secondary | ICD-10-CM

## 2022-07-28 MED ORDER — SODIUM CHLORIDE 0.9 % IV SOLN
200.0000 mg | Freq: Once | INTRAVENOUS | Status: AC
  Administered 2022-07-28: 200 mg via INTRAVENOUS
  Filled 2022-07-28: qty 200

## 2022-07-28 MED ORDER — SODIUM CHLORIDE 0.9 % IV SOLN
INTRAVENOUS | Status: DC
  Filled 2022-07-28: qty 250

## 2022-07-31 NOTE — Progress Notes (Signed)
Chief Complaint  Patient presents with   Foot Ulcer    Patient came in today for right toe ulcer, hallux, started a week ago, started as a blister, rate of pain 7 out of 10, patient also has neuropathy,      Subjective:  72 y.o. female with PMHx of diabetes mellitus for follow-up evaluation regarding an ulcer to the right great toe.  This is a new onset.  Started a week ago.  She says she does have pain associated to the toe.  She has not anything for treatment.  History of ulcers to the lower extremities   Past Medical History:  Diagnosis Date   Anemia    Anxiety    Bariatric surgery status    Complication of anesthesia    Diificulty breathing for about 15 minutes after bariatric surgery   Constipation    COVID-19    Dysrhythmia    Elevated liver enzymes    GERD (gastroesophageal reflux disease)    Hemorrhoids    Herpes genitalis    High cholesterol    Hyperlipidemia    Hypertension    Hypothyroidism    IDA (iron deficiency anemia) 02/09/2021   Neuropathy    Osteoarthritis    Sleep apnea    CPAP    Past Surgical History:  Procedure Laterality Date   ABDOMINAL HYSTERECTOMY     total for fibroids no h/o abnormal pap   bariatric sleeve  2015   BREAST EXCISIONAL BIOPSY Left 1998   carpal tunnel repair     CATARACT EXTRACTION W/PHACO Right 11/01/2021   Procedure: CATARACT EXTRACTION PHACO AND INTRAOCULAR LENS PLACEMENT (IOC) RIGHT;  Surgeon: Nevada Crane, MD;  Location: [facility];  Service: Ophthalmology;  Laterality: Right;  sleep apnea 4.95 00:49.7   CATARACT EXTRACTION W/PHACO Left 11/15/2021   Procedure: CATARACT EXTRACTION PHACO AND INTRAOCULAR LENS PLACEMENT (IOC) LEFT 4.94 00:32.3;  Surgeon: Nevada Crane, MD;  Location: [facility];  Service: Ophthalmology;  Laterality: Left;  sleep apnea   COLONOSCOPY WITH PROPOFOL N/A 02/11/2016   Procedure: COLONOSCOPY WITH PROPOFOL;  Surgeon: Wyline Mood, MD;  Location: [facility] ENDOSCOPY;  Service:  Endoscopy;  Laterality: N/A;   COLONOSCOPY WITH PROPOFOL N/A 10/01/2019   Procedure: COLONOSCOPY WITH PROPOFOL;  Surgeon: Wyline Mood, MD;  Location: [facility] ENDOSCOPY;  Service: Gastroenterology;  Laterality: N/A;   COLONOSCOPY WITH PROPOFOL N/A 11/19/2020   Procedure: COLONOSCOPY WITH PROPOFOL;  Surgeon: Wyline Mood, MD;  Location: [facility] ENDOSCOPY;  Service: Gastroenterology;  Laterality: N/A;   COLONOSCOPY WITH PROPOFOL N/A 05/05/2022   Procedure: COLONOSCOPY WITH PROPOFOL;  Surgeon: Regis Bill, MD;  Location: [facility] ENDOSCOPY;  Service: Endoscopy;  Laterality: N/A;   ESOPHAGOGASTRODUODENOSCOPY (EGD) WITH PROPOFOL N/A 12/02/2019   Procedure: ESOPHAGOGASTRODUODENOSCOPY (EGD) WITH PROPOFOL;  Surgeon: Wyline Mood, MD;  Location: [facility] ENDOSCOPY;  Service: Gastroenterology;  Laterality: N/A;   ESOPHAGOGASTRODUODENOSCOPY (EGD) WITH PROPOFOL N/A 11/19/2020   Procedure: ESOPHAGOGASTRODUODENOSCOPY (EGD) WITH PROPOFOL;  Surgeon: Wyline Mood, MD;  Location: [facility] ENDOSCOPY;  Service: Gastroenterology;  Laterality: N/A;   ESOPHAGOGASTRODUODENOSCOPY (EGD) WITH PROPOFOL N/A 05/05/2022   Procedure: ESOPHAGOGASTRODUODENOSCOPY (EGD) WITH PROPOFOL;  Surgeon: Regis Bill, MD;  Location: [facility] ENDOSCOPY;  Service: Endoscopy;  Laterality: N/A;   FLEXIBLE SIGMOIDOSCOPY N/A 02/06/2021   Procedure: FLEXIBLE SIGMOIDOSCOPY;  Surgeon: Jaynie Collins, DO;  Location: [facility] ENDOSCOPY;  Service: Gastroenterology;  Laterality: N/A;   GIVENS CAPSULE STUDY N/A 06/07/2021   Procedure: GIVENS CAPSULE STUDY;  Surgeon: Toney Reil, MD;  Location: [facility] ENDOSCOPY;  Service: Gastroenterology;  Laterality: N/A;   GIVENS CAPSULE STUDY N/A 06/09/2021   Procedure: GIVENS CAPSULE STUDY;  Surgeon: Toney Reil, MD;  Location: [facility] ENDOSCOPY;  Service: Gastroenterology;  Laterality: N/A;   HEMORRHOID SURGERY     LAPAROSCOPIC GASTRIC RESTRICTIVE DUODENAL PROCEDURE (DUODENAL SWITCH) Bilateral    2020    Allergies   Allergen Reactions   Bee Pollen Itching   Celecoxib Hives   Hydrocodone Nausea Only   Pollen Extract Itching   Sodium Ferric Gluconate [Ferrous Gluconate]     IV ferric gluconate - shortly after the infusion developed back pain shooting into left arm and bilateral hand swelling   Nasacort [Triamcinolone] Other (See Comments)    Nasal - Nose Bleeds   Vicodin [Hydrocodone-Acetaminophen] Nausea Only     RT great toe 07/26/2022  Objective/Physical Exam General: The patient is alert and oriented x3 in no acute distress.  Dermatology:  Superficial eschar noted to the distal aspect of the right great toe.  Overall appears stable.  No surrounding erythema or edema.  No malodor. Skin is warm, dry and supple bilateral lower extremities.  Vascular: VAS [name] ABI WITH/WO TBI 03/23/2022 ABI Findings:  +---------+------------------+-----+---------+--------+  Right   Rt Pressure (mmHg)IndexWaveform Comment   +---------+------------------+-----+---------+--------+  Brachial 119                                       +---------+------------------+-----+---------+--------+  ATA     144               1.20 triphasic          +---------+------------------+-----+---------+--------+  PTA     140               1.17 triphasic          +---------+------------------+-----+---------+--------+  Great Toe122               1.02 Normal             +---------+------------------+-----+---------+--------+   +---------+------------------+-----+---------+-------+  Left    Lt Pressure (mmHg)IndexWaveform Comment  +---------+------------------+-----+---------+-------+  Brachial 120                                      +---------+------------------+-----+---------+-------+  ATA     148               1.23 triphasic         +---------+------------------+-----+---------+-------+  PTA     151               1.26 triphasic          +---------+------------------+-----+---------+-------+  Great Toe120               1.00 Normal            +---------+------------------+-----+---------+-------+  Summary:  Right: Resting right ankle-brachial index is within normal range. The  right toe-brachial index is normal.  Left: Resting left ankle-brachial index is within normal range. The left  toe-brachial index is normal.   Neurological: Light touch and protective threshold diminished bilaterally.   Musculoskeletal Exam: Range of motion within normal limits to all pedal and ankle joints bilateral. Muscle strength 5/5 in all groups bilateral.   Assessment: 1.  Ulcer distal aspect of the right great toe secondary to diabetes mellitus 2. diabetes mellitus w/ peripheral neuropathy  Plan of Care:  -Patient was evaluated. -Light debridement was performed today to the right great toe.  There is no open wound extending through the dermal layers of the skin.  The eschar/gangrene appears very superficial -Recommend Silvadene cream daily.  Prescription for Silvadene cream sent to pharmacy -Return to clinic 3 weeks   Felecia Shelling, DPM [facility]  Dr. Felecia Shelling, DPM    [address] [place], [place] [address]                Office [phone]  Fax [phone]

## 2022-08-01 ENCOUNTER — Telehealth: Payer: Self-pay | Admitting: Family

## 2022-08-01 NOTE — Telephone Encounter (Signed)
Pt called stating her feet has been swelling for months and she does not know if she need to see the foot and ankle doctor for this issue

## 2022-08-01 NOTE — Telephone Encounter (Signed)
Called pt but was unable to lvm  vm was full

## 2022-08-02 NOTE — Telephone Encounter (Signed)
Called pt but was unable to LVM. Vm was full

## 2022-08-03 DIAGNOSIS — R600 Localized edema: Secondary | ICD-10-CM | POA: Diagnosis not present

## 2022-08-04 NOTE — Telephone Encounter (Signed)
LMTCB

## 2022-08-05 NOTE — Telephone Encounter (Signed)
VM was full unable to lvm

## 2022-08-08 NOTE — Telephone Encounter (Signed)
LVM to call back to office  

## 2022-08-08 NOTE — Telephone Encounter (Signed)
Spoke to pt and she stated that she needed to know if she was diabetic, per Claris Che she is not diabetic at the present time but she does have hx of diabetes.

## 2022-08-09 ENCOUNTER — Ambulatory Visit (INDEPENDENT_AMBULATORY_CARE_PROVIDER_SITE_OTHER): Payer: Medicare Other | Admitting: Podiatry

## 2022-08-09 ENCOUNTER — Encounter: Payer: Self-pay | Admitting: Podiatry

## 2022-08-09 VITALS — BP 107/59 | HR 52

## 2022-08-09 DIAGNOSIS — E0843 Diabetes mellitus due to underlying condition with diabetic autonomic (poly)neuropathy: Secondary | ICD-10-CM

## 2022-08-09 DIAGNOSIS — L97412 Non-pressure chronic ulcer of right heel and midfoot with fat layer exposed: Secondary | ICD-10-CM

## 2022-08-09 NOTE — Progress Notes (Signed)
Chief Complaint  Patient presents with   Wound Check    "I have a blister on my right heel and one is starting on the big toe on my left foot." N - blister L - heel medial right D - 4 days O - suddenly, got worse C - blister, sharp pain A - walking, standing, closed in shoe - I tried on a closed in shoe, it may have caused it T - none   Nail Problem    "It's drying up.  It's like a big dry flap."    Subjective:  72 y.o. female with PMHx of diabetes mellitus for follow-up evaluation regarding an ulcer to the right great toe as well as new onset of a blister that developed to the medial aspect of the right heel.  Patient states that she tried on a new pair of shoes which possibly created the blister.  She presents for further treatment and evaluation   Past Medical History:  Diagnosis Date   Anemia    Anxiety    Bariatric surgery status    Complication of anesthesia    Diificulty breathing for about 15 minutes after bariatric surgery   Constipation    COVID-19    Dysrhythmia    Elevated liver enzymes    GERD (gastroesophageal reflux disease)    Hemorrhoids    Herpes genitalis    High cholesterol    Hyperlipidemia    Hypertension    Hypothyroidism    IDA (iron deficiency anemia) 02/09/2021   Neuropathy    Osteoarthritis    Sleep apnea    CPAP    Past Surgical History:  Procedure Laterality Date   ABDOMINAL HYSTERECTOMY     total for fibroids no h/o abnormal pap   bariatric sleeve  2015   BREAST EXCISIONAL BIOPSY Left 1998   carpal tunnel repair     CATARACT EXTRACTION W/PHACO Right 11/01/2021   Procedure: CATARACT EXTRACTION PHACO AND INTRAOCULAR LENS PLACEMENT (IOC) RIGHT;  Surgeon: Nevada Crane, MD;  Location: [facility];  Service: Ophthalmology;  Laterality: Right;  sleep apnea 4.95 00:49.7   CATARACT EXTRACTION W/PHACO Left 11/15/2021   Procedure: CATARACT EXTRACTION PHACO AND INTRAOCULAR LENS PLACEMENT (IOC) LEFT 4.94 00:32.3;  Surgeon:  [place] Crane, MD;  Location: [facility];  Service: Ophthalmology;  Laterality: Left;  sleep apnea   COLONOSCOPY WITH PROPOFOL N/A 02/11/2016   Procedure: COLONOSCOPY WITH PROPOFOL;  Surgeon: Wyline Mood, MD;  Location: [facility] ENDOSCOPY;  Service: Endoscopy;  Laterality: N/A;   COLONOSCOPY WITH PROPOFOL N/A 10/01/2019   Procedure: COLONOSCOPY WITH PROPOFOL;  Surgeon: Wyline Mood, MD;  Location: [facility] ENDOSCOPY;  Service: Gastroenterology;  Laterality: N/A;   COLONOSCOPY WITH PROPOFOL N/A 11/19/2020   Procedure: COLONOSCOPY WITH PROPOFOL;  Surgeon: Wyline Mood, MD;  Location: [facility] ENDOSCOPY;  Service: Gastroenterology;  Laterality: N/A;   COLONOSCOPY WITH PROPOFOL N/A 05/05/2022   Procedure: COLONOSCOPY WITH PROPOFOL;  Surgeon: Regis Bill, MD;  Location: [facility] ENDOSCOPY;  Service: Endoscopy;  Laterality: N/A;   ESOPHAGOGASTRODUODENOSCOPY (EGD) WITH PROPOFOL N/A 12/02/2019   Procedure: ESOPHAGOGASTRODUODENOSCOPY (EGD) WITH PROPOFOL;  Surgeon: Wyline Mood, MD;  Location: [facility] ENDOSCOPY;  Service: Gastroenterology;  Laterality: N/A;   ESOPHAGOGASTRODUODENOSCOPY (EGD) WITH PROPOFOL N/A 11/19/2020   Procedure: ESOPHAGOGASTRODUODENOSCOPY (EGD) WITH PROPOFOL;  Surgeon: Wyline Mood, MD;  Location: [facility] ENDOSCOPY;  Service: Gastroenterology;  Laterality: N/A;   ESOPHAGOGASTRODUODENOSCOPY (EGD) WITH PROPOFOL N/A 05/05/2022   Procedure: ESOPHAGOGASTRODUODENOSCOPY (EGD) WITH PROPOFOL;  Surgeon: Regis Bill, MD;  Location:  [facility] ENDOSCOPY;  Service: Endoscopy;  Laterality: N/A;   FLEXIBLE SIGMOIDOSCOPY N/A 02/06/2021   Procedure: FLEXIBLE SIGMOIDOSCOPY;  Surgeon: Jaynie Collins, DO;  Location: [facility] ENDOSCOPY;  Service: Gastroenterology;  Laterality: N/A;   GIVENS CAPSULE STUDY N/A 06/07/2021   Procedure: GIVENS CAPSULE STUDY;  Surgeon: Toney Reil, MD;  Location: [facility] ENDOSCOPY;  Service: Gastroenterology;  Laterality: N/A;   GIVENS CAPSULE STUDY N/A 06/09/2021   Procedure:  GIVENS CAPSULE STUDY;  Surgeon: Toney Reil, MD;  Location: [facility] ENDOSCOPY;  Service: Gastroenterology;  Laterality: N/A;   HEMORRHOID SURGERY     LAPAROSCOPIC GASTRIC RESTRICTIVE DUODENAL PROCEDURE (DUODENAL SWITCH) Bilateral    2020    Allergies  Allergen Reactions   Bee Pollen Itching   Celecoxib Hives   Hydrocodone Nausea Only   Pollen Extract Itching   Sodium Ferric Gluconate [Ferrous Gluconate]     IV ferric gluconate - shortly after the infusion developed back pain shooting into left arm and bilateral hand swelling   Nasacort [Triamcinolone] Other (See Comments)    Nasal - Nose Bleeds   Vicodin [Hydrocodone-Acetaminophen] Nausea Only     RT heel predebridement 08/09/2022  RT heel postdebridement 08/09/2022  Objective/Physical Exam General: The patient is alert and oriented x3 in no acute distress.  Dermatology:  The blister/ulcer to the right great toe has healed and completely resolved.  New onset of blister noted to the medial aspect of the right heel measuring approximately 4.0 x 4.0 x 0.2 cm.  Please see above noted photos.  Vascular: VAS [name] ABI WITH/WO TBI 03/23/2022 ABI Findings:  +---------+------------------+-----+---------+--------+  Right   Rt Pressure (mmHg)IndexWaveform Comment   +---------+------------------+-----+---------+--------+  Brachial 119                                       +---------+------------------+-----+---------+--------+  ATA     144               1.20 triphasic          +---------+------------------+-----+---------+--------+  PTA     140               1.17 triphasic          +---------+------------------+-----+---------+--------+  Great Toe122               1.02 Normal             +---------+------------------+-----+---------+--------+   +---------+------------------+-----+---------+-------+  Left    Lt Pressure (mmHg)IndexWaveform Comment   +---------+------------------+-----+---------+-------+  Brachial 120                                      +---------+------------------+-----+---------+-------+  ATA     148               1.23 triphasic         +---------+------------------+-----+---------+-------+  PTA     151               1.26 triphasic         +---------+------------------+-----+---------+-------+  Great Toe120               1.00 Normal            +---------+------------------+-----+---------+-------+  Summary:  Right: Resting right ankle-brachial index is within normal range. The  right toe-brachial index is normal.  Left: Resting left ankle-brachial index  is within normal range. The left  toe-brachial index is normal.   Neurological: Light touch and protective threshold diminished bilaterally.   Musculoskeletal Exam: Range of motion within normal limits to all pedal and ankle joints bilateral. Muscle strength 5/5 in all groups bilateral.  Patient ambulatory.  No prior amputations  Assessment: 1.  Ulcer distal aspect of the right great toe secondary to diabetes mellitus 2. diabetes mellitus w/ peripheral neuropathy   Plan of Care:  -Patient was evaluated. -The blister lesion was deroofed today.  Medically necessary excisional debridement of the the loosely adhered skin was removed demonstrating healthy underlying granular tissue.  Please see above noted photos.  This was performed using a tissue nipper. -Patient has a prescription for Silvadene cream.  Apply daily with a large Band-Aid.  Large Band-Aids were provided for the patient -Return to clinic 3 weeks   Felecia Shelling, DPM [facility]  Dr. Felecia Shelling, DPM    [address] [place], [place] [address]                Office [phone]  Fax [phone]

## 2022-08-12 ENCOUNTER — Ambulatory Visit: Payer: Medicare Other | Admitting: Podiatry

## 2022-08-17 ENCOUNTER — Ambulatory Visit (INDEPENDENT_AMBULATORY_CARE_PROVIDER_SITE_OTHER): Payer: Medicare Other | Admitting: Family

## 2022-08-17 ENCOUNTER — Encounter: Payer: Self-pay | Admitting: Family

## 2022-08-17 VITALS — BP 122/76 | HR 57 | Temp 97.0°F | Ht 61.0 in | Wt 169.0 lb

## 2022-08-17 DIAGNOSIS — R6889 Other general symptoms and signs: Secondary | ICD-10-CM

## 2022-08-17 DIAGNOSIS — G629 Polyneuropathy, unspecified: Secondary | ICD-10-CM

## 2022-08-17 DIAGNOSIS — F1090 Alcohol use, unspecified, uncomplicated: Secondary | ICD-10-CM

## 2022-08-17 DIAGNOSIS — I10 Essential (primary) hypertension: Secondary | ICD-10-CM

## 2022-08-17 DIAGNOSIS — M546 Pain in thoracic spine: Secondary | ICD-10-CM | POA: Diagnosis not present

## 2022-08-17 DIAGNOSIS — G8929 Other chronic pain: Secondary | ICD-10-CM

## 2022-08-17 LAB — URINALYSIS, ROUTINE W REFLEX MICROSCOPIC
Bilirubin Urine: NEGATIVE
Hgb urine dipstick: NEGATIVE
Ketones, ur: NEGATIVE
Leukocytes,Ua: NEGATIVE
Nitrite: NEGATIVE
RBC / HPF: NONE SEEN (ref 0–?)
Specific Gravity, Urine: 1.01 (ref 1.000–1.030)
Total Protein, Urine: NEGATIVE
Urine Glucose: NEGATIVE
Urobilinogen, UA: 0.2 (ref 0.0–1.0)
WBC, UA: NONE SEEN (ref 0–?)
pH: 7 (ref 5.0–8.0)

## 2022-08-17 LAB — BASIC METABOLIC PANEL
BUN: 17 mg/dL (ref 6–23)
CO2: 30 mEq/L (ref 19–32)
Calcium: 9.5 mg/dL (ref 8.4–10.5)
Chloride: 96 mEq/L (ref 96–112)
Creatinine, Ser: 1.02 mg/dL (ref 0.40–1.20)
GFR: 54.77 mL/min — ABNORMAL LOW (ref >60.00)
Glucose, Bld: 98 mg/dL (ref 70–99)
Potassium: 4.2 mEq/L (ref 3.5–5.1)
Sodium: 135 mEq/L (ref 135–145)

## 2022-08-17 NOTE — Patient Instructions (Addendum)
Send me names of ALL Medication, doses and how often you take.   Please let me know the dose of gabapentin when you get home so I can adjust medication  Referral to physical therapy for balance and gait support  Let us know if you dont hear back within a week in regards to an appointment being scheduled.   So that you are aware, if you are [facility] MyChart user , please pay attention to your MyChart messages as you may receive a MyChart message with a phone number to call and schedule this test/appointment own your own from our referral coordinator. This is a new process so I do not want you to miss this message.  If you are not a MyChart user, you will receive a phone call.

## 2022-08-17 NOTE — Progress Notes (Signed)
Assessment & Plan:  Chronic right-sided thoracic back pain Assessment & Plan: Acute on chronic.  Pending x-rays . advised to continue follow-up with neurosurgery to discuss surgical options.  We discussed optimizing gabapentin 900mg  Bid, however patient is not entirely sure what dose that she is on.  When she confirms what dose that you are on, I would likely increase.  Referral to physical therapy to address gait and balance  Orders: -     Basic metabolic panel -     Urinalysis, Routine w reflex microscopic -     Urine Culture -     DG Thoracic Spine W/Swimmers; Future -     DG Abd 1 View; Future  Other general symptoms and signs -     Urine Culture  Neuropathy -     Ambulatory referral to Physical Therapy  Alcohol use disorder Assessment & Plan: Congratulated patient on 6 weeks of sobriety.  She is following closely with [facility].  New medication started however she does not recall name.  I have asked her to call the office with this information   Essential hypertension Assessment & Plan: Chronic, stable.  Continue losartan 100 mg qd      Return precautions given.   Risks, benefits, and alternatives of the medications and treatment plan prescribed today were discussed, and patient expressed understanding.   Education regarding symptom management and diagnosis given to patient on AVS either electronically or printed.  No follow-ups on file.  [name], FNP  Subjective:    Patient ID: Jennifer [name], female    DOB: 1950/08/22, 72 y.o.   MRN: [id number]  CC: Jennifer [name] is a 72 y.o. female who presents today for an acute visit.    HPI: She complains of right low back pain Pain radiates to right buttucks.   Pain is worse when cooking or housework.   She reports a previous fall on right side of back which aggrevated pain.   No recent falls.   No groin pain, saddle anesthesia, stool or urine incontinence, dysuria, constipation, nausea.    She has chronic peripheral neuropathy in toes and bottom of foot.  This affects her gait and balance  She did PT twice however back pain was worse the session.    She had not had alcohol in 6 weeks; she is following with psychiatry [facility] and taking 'medication' to reduce cravings.   No history of renal stone.    She has started lasix 20mg  every day. She is not taking potassium. This was started at urgent care 08/03/22. She sees Dr Juliann Pares 08/25/22.    Establishing care with Dr Mia Creek 11/29/2022  Patient compliant with trazodone 50 mg daily. She is taking gabapentin 900mg  Bid, she will confirm She is taking tyleonol arthritis 2 in the morning.   She doesn't take NSAIDs  She has previously tried NSAIDs, hydrocodone, Tylenol, gabapentin, duloxetine, epidural steroid injections  Appointment with Dr. Myer Haff 03/22/2022 for chronic right-sided low back pain with right-sided sciatica who recommended physical therapy.  Discussed wounds on her feet will need to be healed before any consideration of operative intervention should she fail conservative management. Follow-up 8 weeks time  Previously seen by physiatry, last seen [facility] 01/12/2022 for posterior cervical spine and left shoulder pain.  Advised continue Tylenol for-itis 2 tablets twice daily as needed.  Referral to neurosurgery Dr. Marcell Barlow  MRI lumbar spine updated from 2022  MRI L spine 03/09/2022 IMPRESSION: 1. Stable mild bilateral lateral recess  stenosis at L2-3 and L3-4. 2. Stable mild spinal and bilateral lateral recess stenosis and mild bilateral foraminal stenosis at L4-5. 3. Stable advanced lower lumbar facet disease.  Colonoscopy 05/05/2022 internal hemorrhoids  Ultrasound right upper quadrant 03/21/2022, gallbladder surgically absent.  Common bile duct 7.5 mm normal after cholecystectomy.  Diffuse increased echogenicity throughout the liver  Echocardiogram LVEF > 55%  B12 07/06/22  2,323   Allergies: Bee pollen, Celecoxib, Hydrocodone, Pollen extract, Sodium ferric gluconate [ferrous gluconate], Nasacort [triamcinolone], and Vicodin [hydrocodone-acetaminophen] Current Outpatient Medications on File Prior to Visit  Medication Sig Dispense Refill   acetaminophen (TYLENOL) 325 MG tablet Take 2 tablets (650 mg total) by mouth every 6 (six) hours as needed for mild pain (or Fever >/= 101).     acyclovir (ZOVIRAX) 400 MG tablet Take 1 tablet (400 mg total) by mouth daily. 30 tablet 5   diazepam (VALIUM) 5 MG tablet SMARTSIG:1-2 Tablet(s) By Mouth     FLUoxetine (PROZAC) 20 MG capsule Take 20 mg by mouth daily.     fluticasone (FLONASE) 50 MCG/ACT nasal spray SPRAY 1 SPRAY INTO BOTH NOSTRILS DAILY. 16 mL 2   folic acid (FOLVITE) 1 MG tablet Take 1 tablet (1 mg total) by mouth daily. 360 tablet 0   furosemide (LASIX) 20 MG tablet Take 20 mg by mouth.     gabapentin (NEURONTIN) 300 MG capsule Take 900 mg by mouth 2 (two) times daily.     hydrocortisone (ANUSOL-HC) 25 MG suppository Unwrap and insert 1 suppository rectally twice a day 12 suppository 1   levothyroxine (SYNTHROID) 50 MCG tablet TAKE 1 TABLET (50 MCG TOTAL) BY MOUTH DAILY. DX CODE E03.9 90 tablet 1   losartan (COZAAR) 100 MG tablet TAKE 1 TABLET BY MOUTH EVERYDAY AT BEDTIME (Patient taking differently: Take 100 mg by mouth daily. TAKE 1 TABLET BY MOUTH EVERYDAY AT BEDTIME) 90 tablet 3   Multiple Vitamin (MULTIVITAMIN WITH MINERALS) TABS tablet Take 1 tablet by mouth daily.     pantoprazole (PROTONIX) 40 MG tablet Take 1 tablet (40 mg total) by mouth 2 (two) times daily. 180 tablet 3   silver sulfADIAZINE (SILVADENE) 1 % cream Apply 1 Application topically daily. 25 g 1   sodium chloride (OCEAN) 0.65 % SOLN nasal spray Place 2 sprays into both nostrils as needed for congestion. 30 mL 2   traZODone (DESYREL) 50 MG tablet TAKE 1 TABLET BY MOUTH EVERY DAY FOR SLEEP DX CODE G47.00 90 tablet 3   No current  facility-administered medications on file prior to visit.    Review of Systems  Constitutional:  Negative for chills and fever.  Respiratory:  Negative for cough.   Cardiovascular:  Negative for chest pain and palpitations.  Gastrointestinal:  Negative for nausea and vomiting.  Musculoskeletal:  Positive for arthralgias and back pain.  Neurological:  Positive for numbness.      Objective:    BP 122/76   Pulse (!) 57   Temp (!) 97 F (36.1 C)   Ht 5\' 1"  (1.549 m)   Wt 169 lb (76.7 kg)   SpO2 97%   BMI 31.93 kg/m   BP Readings from Last 3 Encounters:  08/17/22 122/76  08/09/22 (!) 107/59  07/28/22 (!) 123/51   Wt Readings from Last 3 Encounters:  08/17/22 169 lb (76.7 kg)  07/22/22 175 lb 3.2 oz (79.5 kg)  07/13/22 174 lb 6.4 oz (79.1 kg)    Physical Exam Vitals reviewed.  Constitutional:      Appearance: She  is well-developed.  Eyes:     Conjunctiva/sclera: Conjunctivae normal.  Cardiovascular:     Rate and Rhythm: Normal rate and regular rhythm.     Pulses: Normal pulses.     Heart sounds: Normal heart sounds.  Pulmonary:     Effort: Pulmonary effort is normal.     Breath sounds: Normal breath sounds. No wheezing, rhonchi or rales.  Musculoskeletal:     Lumbar back: No swelling, edema, spasms, tenderness or bony tenderness. Normal range of motion.     Comments: Full range of motion with flexion, tension, lateral side bends. No bony tenderness. No pain, numbness, tingling elicited with single leg raise bilaterally.   Skin:    General: Skin is warm and dry.  Neurological:     Mental Status: She is alert.     Sensory: No sensory deficit.     Deep Tendon Reflexes:     Reflex Scores:      Patellar reflexes are 2+ on the right side and 2+ on the left side.    Comments: Sensation and strength intact bilateral lower extremities.  Psychiatric:        Speech: Speech normal.        Behavior: Behavior normal.        Thought Content: Thought content normal.

## 2022-08-18 ENCOUNTER — Other Ambulatory Visit: Payer: Medicare Other

## 2022-08-18 ENCOUNTER — Ambulatory Visit: Payer: Medicare Other

## 2022-08-18 ENCOUNTER — Ambulatory Visit
Admission: RE | Admit: 2022-08-18 | Discharge: 2022-08-18 | Disposition: A | Discharge status: Home / Self Care | Payer: Medicare Other | Source: Ambulatory Visit | Attending: Family | Admitting: Family

## 2022-08-18 DIAGNOSIS — K5900 Constipation, unspecified: Secondary | ICD-10-CM | POA: Diagnosis not present

## 2022-08-18 DIAGNOSIS — G8929 Other chronic pain: Secondary | ICD-10-CM

## 2022-08-18 DIAGNOSIS — M5134 Other intervertebral disc degeneration, thoracic region: Secondary | ICD-10-CM | POA: Diagnosis not present

## 2022-08-18 DIAGNOSIS — R109 Unspecified abdominal pain: Secondary | ICD-10-CM | POA: Diagnosis not present

## 2022-08-18 DIAGNOSIS — M47814 Spondylosis without myelopathy or radiculopathy, thoracic region: Secondary | ICD-10-CM | POA: Diagnosis not present

## 2022-08-18 DIAGNOSIS — M546 Pain in thoracic spine: Secondary | ICD-10-CM | POA: Diagnosis not present

## 2022-08-22 NOTE — Assessment & Plan Note (Signed)
Congratulated patient on 6 weeks of sobriety.  She is following closely with [facility].  New medication started however she does not recall name.  I have asked her to call the office with this information

## 2022-08-22 NOTE — Assessment & Plan Note (Addendum)
Acute on chronic.  Pending x-rays . advised to continue follow-up with neurosurgery to discuss surgical options.  We discussed optimizing gabapentin 900mg  Bid, however patient is not entirely sure what dose that she is on.  When she confirms what dose that you are on, I would likely increase.  Referral to physical therapy to address gait and balance

## 2022-08-22 NOTE — Assessment & Plan Note (Signed)
Chronic, stable.  Continue losartan 100 mg qd

## 2022-08-23 ENCOUNTER — Ambulatory Visit (INDEPENDENT_AMBULATORY_CARE_PROVIDER_SITE_OTHER): Payer: Medicare Other | Admitting: Family

## 2022-08-23 ENCOUNTER — Encounter: Payer: Self-pay | Admitting: Family

## 2022-08-23 VITALS — BP 130/70 | HR 64 | Temp 98.3°F | Ht 61.0 in | Wt 169.4 lb

## 2022-08-23 DIAGNOSIS — M546 Pain in thoracic spine: Secondary | ICD-10-CM | POA: Diagnosis not present

## 2022-08-23 DIAGNOSIS — M7989 Other specified soft tissue disorders: Secondary | ICD-10-CM

## 2022-08-23 DIAGNOSIS — F1090 Alcohol use, unspecified, uncomplicated: Secondary | ICD-10-CM

## 2022-08-23 DIAGNOSIS — I10 Essential (primary) hypertension: Secondary | ICD-10-CM

## 2022-08-23 DIAGNOSIS — G8929 Other chronic pain: Secondary | ICD-10-CM

## 2022-08-23 MED ORDER — GABAPENTIN 300 MG PO CAPS: 900.0000 mg | ORAL_CAPSULE | Freq: Two times a day (BID) | ORAL | 3 refills | Status: DC

## 2022-08-23 MED ORDER — TORSEMIDE 20 MG PO TABS
20.0000 mg | ORAL_TABLET | Freq: Every day | ORAL | Status: DC
Start: 2022-08-23 — End: 2023-07-11

## 2022-08-23 MED ORDER — TRIAMTERENE-HCTZ 37.5-25 MG PO TABS: 1.0000 | ORAL_TABLET | Freq: Every day | ORAL | Status: DC

## 2022-08-23 MED ORDER — POTASSIUM CHLORIDE CRYS ER 20 MEQ PO TBCR
20.0000 meq | EXTENDED_RELEASE_TABLET | Freq: Every day | ORAL | Status: AC
Start: 2022-08-23 — End: ?

## 2022-08-23 MED ORDER — GABAPENTIN 100 MG PO CAPS: ORAL_CAPSULE | ORAL | 3 refills | Status: DC

## 2022-08-23 NOTE — Patient Instructions (Addendum)
We will GRADUALLY increase gabapentin  First week Take 100mg  with morning dose gabapentin 900mg  for one week  second week add on lunch time dose of 100mg     3rd week add on gabapentin 100mg  in the evening.   After 3 weeks :   new dose gabapentin 1000mg  qam, 100mg  midday and 1000mg  at bedtime.   Nice to see you!

## 2022-08-23 NOTE — Progress Notes (Unsigned)
Assessment & Plan:  There are no diagnoses linked to this encounter.   Return precautions given.   Risks, benefits, and alternatives of the medications and treatment plan prescribed today were discussed, and patient expressed understanding.   Education regarding symptom management and diagnosis given to patient on AVS either electronically or printed.  No follow-ups on file.  [name], FNP  Subjective:    Patient ID: Jennifer [name], female    DOB: July 25, 1950, 72 y.o.   MRN: [id number]  CC: Jennifer [name] is a 72 y.o. female who presents today for follow up.   HPI: Follow-up here for medication reconciliation and to discuss optimizing gabapentin 900mg  BID.     Referral in place to physical therapy Started on prozac and naltrexone 50mg   by psychiatrist at [facility]  Leg swelling has significantly improved with torsemide 20mg  daily, and potassium chloride qd.  She is planning to start compression stockings.   Allergies: Bee pollen, Celecoxib, Hydrocodone, Pollen extract, Sodium ferric gluconate [ferrous gluconate], Nasacort [triamcinolone], and Vicodin [hydrocodone-acetaminophen] Current Outpatient Medications on File Prior to Visit  Medication Sig Dispense Refill   acetaminophen (TYLENOL) 325 MG tablet Take 2 tablets (650 mg total) by mouth every 6 (six) hours as needed for mild pain (or Fever >/= 101).     acyclovir (ZOVIRAX) 400 MG tablet Take 1 tablet (400 mg total) by mouth daily. 30 tablet 5   diazepam (VALIUM) 5 MG tablet SMARTSIG:1-2 Tablet(s) By Mouth     FLUoxetine (PROZAC) 20 MG capsule Take 20 mg by mouth daily.     fluticasone (FLONASE) 50 MCG/ACT nasal spray SPRAY 1 SPRAY INTO BOTH NOSTRILS DAILY. 16 mL 2   folic acid (FOLVITE) 1 MG tablet Take 1 tablet (1 mg total) by mouth daily. 360 tablet 0   furosemide (LASIX) 20 MG tablet Take 20 mg by mouth.     gabapentin (NEURONTIN) 300 MG capsule Take 900 mg by mouth 2 (two) times daily.      hydrocortisone (ANUSOL-HC) 25 MG suppository Unwrap and insert 1 suppository rectally twice a day 12 suppository 1   levothyroxine (SYNTHROID) 50 MCG tablet TAKE 1 TABLET (50 MCG TOTAL) BY MOUTH DAILY. DX CODE E03.9 90 tablet 1   losartan (COZAAR) 100 MG tablet TAKE 1 TABLET BY MOUTH EVERYDAY AT BEDTIME (Patient taking differently: Take 100 mg by mouth daily. TAKE 1 TABLET BY MOUTH EVERYDAY AT BEDTIME) 90 tablet 3   Multiple Vitamin (MULTIVITAMIN WITH MINERALS) TABS tablet Take 1 tablet by mouth daily.     pantoprazole (PROTONIX) 40 MG tablet Take 1 tablet (40 mg total) by mouth 2 (two) times daily. 180 tablet 3   silver sulfADIAZINE (SILVADENE) 1 % cream Apply 1 Application topically daily. 25 g 1   sodium chloride (OCEAN) 0.65 % SOLN nasal spray Place 2 sprays into both nostrils as needed for congestion. 30 mL 2   traZODone (DESYREL) 50 MG tablet TAKE 1 TABLET BY MOUTH EVERY DAY FOR SLEEP DX CODE G47.00 90 tablet 3   No current facility-administered medications on file prior to visit.    Review of Systems    Objective:    There were no vitals taken for this visit. BP Readings from Last 3 Encounters:  08/17/22 122/76  08/09/22 (!) 107/59  07/28/22 (!) 123/51   Wt Readings from Last 3 Encounters:  08/17/22 169 lb (76.7 kg)  07/22/22 175 lb 3.2 oz (79.5 kg)  07/13/22 174 lb 6.4 oz (79.1 kg)    Physical  Exam

## 2022-08-24 ENCOUNTER — Other Ambulatory Visit: Payer: Self-pay | Admitting: Family

## 2022-08-24 DIAGNOSIS — F32A Anxiety disorder, unspecified: Secondary | ICD-10-CM

## 2022-08-24 NOTE — Assessment & Plan Note (Signed)
Congratulated patient on sobriety.  She is following with [facility] whom is managing naltrexone 50 mg daily

## 2022-08-24 NOTE — Assessment & Plan Note (Signed)
Chronic, suboptimal control.  We agreed to adjust gabapentin dose with slow titration to gabapentin 1000mg  qam, 100mg  midday and 1000mg  at bedtime.  Counseled on monitoring for excessive sedation.  Titration instructions provided on after visit summary.  Close follow up.

## 2022-08-24 NOTE — Assessment & Plan Note (Addendum)
Chronic, stable.  Continue losartan 100 mg every day, triamterene hydrochlorothiazide 37.5-25mg .

## 2022-08-25 ENCOUNTER — Ambulatory Visit
Admission: RE | Admit: 2022-08-25 | Discharge: 2022-08-25 | Disposition: A | Discharge status: Home / Self Care | Payer: Medicare Other | Source: Ambulatory Visit | Attending: Family | Admitting: Family

## 2022-08-25 DIAGNOSIS — Z780 Asymptomatic menopausal state: Secondary | ICD-10-CM | POA: Insufficient documentation

## 2022-08-25 DIAGNOSIS — E7849 Other hyperlipidemia: Secondary | ICD-10-CM | POA: Diagnosis not present

## 2022-08-25 DIAGNOSIS — R001 Bradycardia, unspecified: Secondary | ICD-10-CM | POA: Diagnosis not present

## 2022-08-25 DIAGNOSIS — Z1231 Encounter for screening mammogram for malignant neoplasm of breast: Secondary | ICD-10-CM | POA: Diagnosis not present

## 2022-08-25 DIAGNOSIS — G4733 Obstructive sleep apnea (adult) (pediatric): Secondary | ICD-10-CM | POA: Diagnosis not present

## 2022-08-25 DIAGNOSIS — I872 Venous insufficiency (chronic) (peripheral): Secondary | ICD-10-CM | POA: Diagnosis not present

## 2022-08-25 DIAGNOSIS — K219 Gastro-esophageal reflux disease without esophagitis: Secondary | ICD-10-CM | POA: Diagnosis not present

## 2022-08-25 DIAGNOSIS — I10 Essential (primary) hypertension: Secondary | ICD-10-CM | POA: Diagnosis not present

## 2022-08-25 DIAGNOSIS — M7989 Other specified soft tissue disorders: Secondary | ICD-10-CM | POA: Diagnosis not present

## 2022-08-25 DIAGNOSIS — I2510 Atherosclerotic heart disease of native coronary artery without angina pectoris: Secondary | ICD-10-CM | POA: Diagnosis not present

## 2022-08-29 DIAGNOSIS — I2510 Atherosclerotic heart disease of native coronary artery without angina pectoris: Secondary | ICD-10-CM | POA: Diagnosis not present

## 2022-08-30 ENCOUNTER — Ambulatory Visit: Payer: Medicare Other | Admitting: Podiatry

## 2022-09-01 ENCOUNTER — Encounter (INDEPENDENT_AMBULATORY_CARE_PROVIDER_SITE_OTHER): Payer: Self-pay

## 2022-09-02 ENCOUNTER — Telehealth: Payer: Self-pay

## 2022-09-02 NOTE — Telephone Encounter (Signed)
LMOM for pt to CB in regards to bone density and x ray

## 2022-09-05 ENCOUNTER — Telehealth: Payer: Self-pay

## 2022-09-05 NOTE — Telephone Encounter (Signed)
-----   Message from [name] sent at 09/02/2022  6:15 AM EDT ----- Call patient Patient has not viewed MyChart result note.  Please review my chart note in detail with patient.   Please let me know if questions

## 2022-09-05 NOTE — Telephone Encounter (Signed)
Lvm, See margaret's note below

## 2022-09-05 NOTE — Telephone Encounter (Signed)
Lvm for pt to give call office back see Margaret's note below

## 2022-09-06 ENCOUNTER — Encounter: Payer: Self-pay | Admitting: Podiatry

## 2022-09-06 ENCOUNTER — Ambulatory Visit (INDEPENDENT_AMBULATORY_CARE_PROVIDER_SITE_OTHER): Payer: Medicare Other | Admitting: Podiatry

## 2022-09-06 DIAGNOSIS — L97412 Non-pressure chronic ulcer of right heel and midfoot with fat layer exposed: Secondary | ICD-10-CM | POA: Diagnosis not present

## 2022-09-06 NOTE — Progress Notes (Signed)
Chief Complaint  Patient presents with   Wound Check    "It's healed."    Subjective:  72 y.o. female with PMHx of diabetes mellitus for follow-up evaluation regarding an ulcer to the right great toe as well as the medial aspect of the right heel.  Patient applied the Silvadene cream as instructed.  She says that the wounds have resolved.  She now applies Aquaphor.   Past Medical History:  Diagnosis Date   Anemia    Anxiety    Bariatric surgery status    Complication of anesthesia    Diificulty breathing for about 15 minutes after bariatric surgery   Constipation    COVID-19    Dysrhythmia    Elevated liver enzymes    GERD (gastroesophageal reflux disease)    Hemorrhoids    Herpes genitalis    High cholesterol    Hyperlipidemia    Hypertension    Hypothyroidism    IDA (iron deficiency anemia) 02/09/2021   Neuropathy    Osteoarthritis    Sleep apnea    CPAP    Past Surgical History:  Procedure Laterality Date   ABDOMINAL HYSTERECTOMY     total for fibroids no h/o abnormal pap   bariatric sleeve  2015   BREAST EXCISIONAL BIOPSY Left 1998   carpal tunnel repair     CATARACT EXTRACTION W/PHACO Right 11/01/2021   Procedure: CATARACT EXTRACTION PHACO AND INTRAOCULAR LENS PLACEMENT (IOC) RIGHT;  Surgeon: Nevada Crane, MD;  Location: [facility];  Service: Ophthalmology;  Laterality: Right;  sleep apnea 4.95 00:49.7   CATARACT EXTRACTION W/PHACO Left 11/15/2021   Procedure: CATARACT EXTRACTION PHACO AND INTRAOCULAR LENS PLACEMENT (IOC) LEFT 4.94 00:32.3;  Surgeon: Nevada Crane, MD;  Location: [facility];  Service: Ophthalmology;  Laterality: Left;  sleep apnea   COLONOSCOPY WITH PROPOFOL N/A 02/11/2016   Procedure: COLONOSCOPY WITH PROPOFOL;  Surgeon: Wyline Mood, MD;  Location: [facility] ENDOSCOPY;  Service: Endoscopy;  Laterality: N/A;   COLONOSCOPY WITH PROPOFOL N/A 10/01/2019   Procedure: COLONOSCOPY WITH PROPOFOL;  Surgeon: Wyline Mood, MD;   Location: [facility]COPY;  Service: Gastroenterology;  Laterality: N/A;   COLONOSCOPY WITH PROPOFOL N/A 11/19/2020   Procedure: COLONOSCOPY WITH PROPOFOL;  Surgeon: Wyline Mood, MD;  Location: [facility] ENDOSCOPY;  Service: Gastroenterology;  Laterality: N/A;   COLONOSCOPY WITH PROPOFOL N/A 05/05/2022   Procedure: COLONOSCOPY WITH PROPOFOL;  Surgeon: Regis Bill, MD;  Location: [facility] ENDOSCOPY;  Service: Endoscopy;  Laterality: N/A;   ESOPHAGOGASTRODUODENOSCOPY (EGD) WITH PROPOFOL N/A 12/02/2019   Procedure: ESOPHAGOGASTRODUODENOSCOPY (EGD) WITH PROPOFOL;  Surgeon: Wyline Mood, MD;  Location: [facility] ENDOSCOPY;  Service: Gastroenterology;  Laterality: N/A;   ESOPHAGOGASTRODUODENOSCOPY (EGD) WITH PROPOFOL N/A 11/19/2020   Procedure: ESOPHAGOGASTRODUODENOSCOPY (EGD) WITH PROPOFOL;  Surgeon: Wyline Mood, MD;  Location: [facility] END[facility]COPY;  Service: Gastroenterology;  Laterality: N/A;   ESOPHAGOGASTRODUODENOSCOPY (EGD) WITH PROPOFOL N/A 05/05/2022   Procedure: ESOPHAGOGASTRODUODENOSCOPY (EGD) WITH PROPOFOL;  Surgeon: Regis Bill, MD;  Location: [facility] ENDOSCOPY;  Service: Endoscopy;  Laterality: N/A;   FLEXIBLE SIGMOIDOSCOPY N/A 02/06/2021   Procedure: FLEXIBLE SIGMOIDOSCOPY;  Surgeon: Jaynie Collins, DO;  Location: [facility] ENDOSCOPY;  Service: Gastroenterology;  Laterality: N/A;   GIVENS CAPSULE STUDY N/A 06/07/2021   Procedure: GIVENS CAPSULE STUDY;  Surgeon: Toney Reil, MD;  Location: [facility] ENDOSCOPY;  Service: Gastroenterology;  Laterality: N/A;   GIVENS CAPSULE STUDY N/A 06/09/2021   Procedure: GIVENS CAPSULE STUDY;  Surgeon: Toney Reil, MD;  Location: [facility] ENDOSCOPY;  Service: Gastroenterology;  Laterality:  N/A;   HEMORRHOID SURGERY     LAPAROSCOPIC GASTRIC RESTRICTIVE DUODENAL PROCEDURE (DUODENAL SWITCH) Bilateral    2020    Allergies  Allergen Reactions   Bee Pollen Itching   Celecoxib Hives   Hydrocodone Nausea Only   Pollen Extract Itching   Sodium Ferric Gluconate  [Ferrous Gluconate]     IV ferric gluconate - shortly after the infusion developed back pain shooting into left arm and bilateral hand swelling   Nasacort [Triamcinolone] Other (See Comments)    Nasal - Nose Bleeds   Vicodin [Hydrocodone-Acetaminophen] Nausea Only     RT heel predebridement 08/09/2022  RT heel postdebridement 08/09/2022  Objective/Physical Exam General: The patient is alert and oriented x3 in no acute distress.  Dermatology:  The blister/ulcer to the right great toe and posterior heel has resolved and healed completely.  There is no open wound.  Normal skin integument.  Vascular: VAS [name] ABI WITH/WO TBI 03/23/2022 ABI Findings:  +---------+------------------+-----+---------+--------+  Right   Rt Pressure (mmHg)IndexWaveform Comment   +---------+------------------+-----+---------+--------+  Brachial 119                                       +---------+------------------+-----+---------+--------+  ATA     144               1.20 triphasic          +---------+------------------+-----+---------+--------+  PTA     140               1.17 triphasic          +---------+------------------+-----+---------+--------+  Great Toe122               1.02 Normal             +---------+------------------+-----+---------+--------+   +---------+------------------+-----+---------+-------+  Left    Lt Pressure (mmHg)IndexWaveform Comment  +---------+------------------+-----+---------+-------+  Brachial 120                                      +---------+------------------+-----+---------+-------+  ATA     148               1.23 triphasic         +---------+------------------+-----+---------+-------+  PTA     151               1.26 triphasic         +---------+------------------+-----+---------+-------+  Great Toe120               1.00 Normal            +---------+------------------+-----+---------+-------+  Summary:  Right:  Resting right ankle-brachial index is within normal range. The  right toe-brachial index is normal.  Left: Resting left ankle-brachial index is within normal range. The left  toe-brachial index is normal.   Neurological: Light touch and protective threshold diminished bilaterally.   Musculoskeletal Exam: Range of motion within normal limits to all pedal and ankle joints bilateral. Muscle strength 5/5 in all groups bilateral.  Patient ambulatory.  No prior amputations  Assessment: 1.  Ulcer distal aspect of the right great toe as well as the medial aspect of the heel secondary to diabetes mellitus; healed 2. diabetes mellitus w/ peripheral neuropathy   Plan of Care:  -Patient was evaluated. - The blister lesions and the ulcer to the distal aspect of the right  great toe have healed and resolved completely.  She is now applying Aquaphor on a daily basis. -Patient may resume regular shoe gear -Return to clinic as needed   Felecia Shelling, DPM [facility]  Dr. Felecia Shelling, DPM    [address] [place], [place] [address]                Office [phone]  Fax [phone]

## 2022-09-07 ENCOUNTER — Inpatient Hospital Stay: Payer: Medicare Other | Attending: Oncology

## 2022-09-07 DIAGNOSIS — D508 Other iron deficiency anemias: Secondary | ICD-10-CM | POA: Insufficient documentation

## 2022-09-07 DIAGNOSIS — D500 Iron deficiency anemia secondary to blood loss (chronic): Secondary | ICD-10-CM

## 2022-09-07 DIAGNOSIS — Z87891 Personal history of nicotine dependence: Secondary | ICD-10-CM | POA: Insufficient documentation

## 2022-09-07 DIAGNOSIS — Z9884 Bariatric surgery status: Secondary | ICD-10-CM | POA: Insufficient documentation

## 2022-09-07 LAB — CBC WITH DIFFERENTIAL (CANCER CENTER ONLY)
Abs Immature Granulocytes: 0.04 10*3/uL (ref 0.00–0.07)
Basophils Absolute: 0 10*3/uL (ref 0.0–0.1)
Basophils Relative: 1 %
Eosinophils Absolute: 0.1 10*3/uL (ref 0.0–0.5)
Eosinophils Relative: 2 %
HCT: 32.6 % — ABNORMAL LOW (ref 36.0–46.0)
Hemoglobin: 10.5 g/dL — ABNORMAL LOW (ref 12.0–15.0)
Immature Granulocytes: 1 %
Lymphocytes Relative: 29 %
Lymphs Abs: 1.3 10*3/uL (ref 0.7–4.0)
MCH: 26.9 pg (ref 26.0–34.0)
MCHC: 32.2 g/dL (ref 30.0–36.0)
MCV: 83.4 fL (ref 80.0–100.0)
Monocytes Absolute: 0.6 10*3/uL (ref 0.1–1.0)
Monocytes Relative: 13 %
Neutro Abs: 2.6 10*3/uL (ref 1.7–7.7)
Neutrophils Relative %: 54 %
Platelet Count: 170 10*3/uL (ref 150–400)
RBC: 3.91 MIL/uL (ref 3.87–5.11)
RDW: 16.7 % — ABNORMAL HIGH (ref 11.5–15.5)
WBC Count: 4.7 10*3/uL (ref 4.0–10.5)
nRBC: 0 % (ref 0.0–0.2)

## 2022-09-07 LAB — CMP (CANCER CENTER ONLY)
ALT: 19 U/L (ref 0–44)
AST: 42 U/L — ABNORMAL HIGH (ref 15–41)
Albumin: 3.6 g/dL (ref 3.5–5.0)
Alkaline Phosphatase: 102 U/L (ref 38–126)
Anion gap: 8 (ref 5–15)
BUN: 18 mg/dL (ref 8–23)
CO2: 26 mmol/L (ref 22–32)
Calcium: 8.8 mg/dL — ABNORMAL LOW (ref 8.9–10.3)
Chloride: 99 mmol/L (ref 98–111)
Creatinine: 1.15 mg/dL — ABNORMAL HIGH (ref 0.44–1.00)
GFR, Estimated: 50 mL/min — ABNORMAL LOW (ref >60)
Glucose, Bld: 102 mg/dL — ABNORMAL HIGH (ref 70–99)
Potassium: 4 mmol/L (ref 3.5–5.1)
Sodium: 133 mmol/L — ABNORMAL LOW (ref 135–145)
Total Bilirubin: 0.4 mg/dL (ref 0.3–1.2)
Total Protein: 6.9 g/dL (ref 6.5–8.1)

## 2022-09-07 LAB — IRON AND TIBC
Iron: 37 ug/dL (ref 28–170)
Saturation Ratios: 12 % (ref 10.4–31.8)
TIBC: 298 ug/dL (ref 250–450)
UIBC: 261 ug/dL

## 2022-09-07 LAB — RETIC PANEL
Immature Retic Fract: 5.3 % (ref 2.3–15.9)
RBC.: 3.86 MIL/uL — ABNORMAL LOW (ref 3.87–5.11)
Retic Count, Absolute: 38.6 10*3/uL (ref 19.0–186.0)
Retic Ct Pct: 1 % (ref 0.4–3.1)
Reticulocyte Hemoglobin: 30.3 pg (ref >27.9)

## 2022-09-07 LAB — FERRITIN: Ferritin: 83 ng/mL (ref 11–307)

## 2022-09-09 ENCOUNTER — Other Ambulatory Visit: Payer: Self-pay | Admitting: Podiatry

## 2022-09-09 DIAGNOSIS — M7989 Other specified soft tissue disorders: Secondary | ICD-10-CM | POA: Diagnosis not present

## 2022-09-09 DIAGNOSIS — M79671 Pain in right foot: Secondary | ICD-10-CM

## 2022-09-09 DIAGNOSIS — E11621 Type 2 diabetes mellitus with foot ulcer: Secondary | ICD-10-CM

## 2022-09-11 ENCOUNTER — Other Ambulatory Visit: Payer: Self-pay | Admitting: Family

## 2022-09-11 DIAGNOSIS — E039 Hypothyroidism, unspecified: Secondary | ICD-10-CM

## 2022-09-14 ENCOUNTER — Inpatient Hospital Stay: Payer: Medicare Other

## 2022-09-14 ENCOUNTER — Inpatient Hospital Stay: Payer: Medicare Other | Admitting: Oncology

## 2022-09-14 ENCOUNTER — Encounter: Payer: Self-pay | Admitting: Oncology

## 2022-09-14 VITALS — BP 117/70 | HR 52 | Temp 96.9°F | Resp 18 | Wt 169.4 lb

## 2022-09-14 VITALS — BP 128/65 | HR 55 | Resp 18

## 2022-09-14 DIAGNOSIS — D62 Acute posthemorrhagic anemia: Secondary | ICD-10-CM

## 2022-09-14 DIAGNOSIS — D500 Iron deficiency anemia secondary to blood loss (chronic): Secondary | ICD-10-CM

## 2022-09-14 DIAGNOSIS — D508 Other iron deficiency anemias: Secondary | ICD-10-CM | POA: Diagnosis not present

## 2022-09-14 DIAGNOSIS — Z9884 Bariatric surgery status: Secondary | ICD-10-CM

## 2022-09-14 DIAGNOSIS — Z87891 Personal history of nicotine dependence: Secondary | ICD-10-CM | POA: Diagnosis not present

## 2022-09-14 MED ORDER — SODIUM CHLORIDE 0.9 % IV SOLN
200.0000 mg | Freq: Once | INTRAVENOUS | Status: AC
  Administered 2022-09-14: 200 mg via INTRAVENOUS
  Filled 2022-09-14: qty 200

## 2022-09-14 MED ORDER — SODIUM CHLORIDE 0.9 % IV SOLN
Freq: Once | INTRAVENOUS | Status: AC
  Filled 2022-09-14: qty 250

## 2022-09-14 NOTE — Assessment & Plan Note (Addendum)
#  Iron deficiency anemia due to chronic blood loss. Labs reviewed and discussed with patient. Lab Results  Component Value Date   HGB 10.5 (L) 09/07/2022   TIBC 298 09/07/2022   IRONPCTSAT 12 09/07/2022   FERRITIN 83 09/07/2022   history of gastric bypass.  Recommend patient to get additional IV Venofer treatment x 1 to further improve iron storel

## 2022-09-14 NOTE — Assessment & Plan Note (Signed)
Normal B12, folate level.

## 2022-09-14 NOTE — Progress Notes (Signed)
Hematology/Oncology Progress note [phone] [phone]         Patient Care Team: Allegra Grana, FNP as PCP - General (Family Medicine) Linna Darner, RD as Dietitian (Family Medicine) Rickard Patience, MD as Consulting Physician (Hematology and Oncology)  ASSESSMENT & PLAN:   IDA (iron deficiency anemia) #Iron deficiency anemia due to chronic blood loss. Labs reviewed and discussed with patient. Lab Results  Component Value Date   HGB 10.5 (L) 09/07/2022   TIBC 298 09/07/2022   IRONPCTSAT 12 09/07/2022   FERRITIN 83 09/07/2022   history of gastric bypass.  Recommend patient to get additional IV Venofer treatment x 1 to further improve iron storel   History of bariatric surgery Normal B12, folate level.   Orders Placed This Encounter  Procedures   CBC with Differential (Cancer Center Only)    Standing Status:   Future    Standing Expiration Date:   09/14/2023   Ferritin    Standing Status:   Future    Standing Expiration Date:   09/14/2023   Iron and TIBC    Standing Status:   Future    Standing Expiration Date:   09/14/2023   Retic Panel    Standing Status:   Future    Standing Expiration Date:   09/14/2023   Folate    Standing Status:   Future    Standing Expiration Date:   09/14/2023   CMP (Cancer Center only)    Standing Status:   Future    Standing Expiration Date:   09/14/2023   Follow up in  3 months All questions were answered. The patient knows to call the clinic with any problems, questions or concerns.  Rickard Patience, MD, PhD [facility] Hematology Oncology 09/14/2022   CHIEF COMPLAINTS/REASON FOR VISIT:  Follow-up for iron deficiency anemia.  HISTORY OF PRESENTING ILLNESS:   Jennifer [name] is a  72 y.o.  female with PMH listed below was seen in consultation at the request of  Allegra Grana, FNP  for evaluation of iron deficiency anemia  Patient was admitted from 02/05/2021 - 02/06/2021 due to symptomatic anemia with bright  red blood per rectum.  Initial hemoglobin at presentation was 5.9.  Patient was transfused with 2 units of PRBC.  Also received 1 dose of IV Venofer treatments.  Patient had a flexible sigmoidoscopy by Dr. Timothy Lasso.  Findings of nonbleeding hemorrhoids.  Patient follows up with Dr. Tobi Bastos outpatient.  Patient was previously taking aspirin which has been discontinued.  Patient had hospitalization from 11/17/2020 - 11/19/2020 due to symptomatic anemia secondary to intermittent BPBPR/melena.  She received blood transfusion during that hospitalization.  She also had a reaction to ferrous gluconate. She had EGD and colonoscopy on 11/19/2020 with no notable findings of active bleeding.   Patient has a history of bariatric surgery.  05/03/2022 -05/05/2022 Admitted due to melena. Acute blood loss anemia. underwent CT angio GI bleed with no active GI hemorrhage but liver steatosis.  status post upper and lower endoscopy. Lower endoscopy showed internal hemorrhoids and upper endoscopy was within normal limits.   INTERVAL HISTORY Jennifer [name] is a 72 y.o. female who has above history reviewed by me today presents for follow up visit for iron deficiency anemia.  She has stopped drinking alcohol for 2 months.  Feels well today. Denies hematochezia, hematuria, hematemesis, epistaxis, black tarry stool or easy bruising.    Review of Systems  Constitutional:  Positive for fatigue. Negative for appetite change, chills and fever.  HENT:   Negative for hearing loss and voice change.   Eyes:  Negative for eye problems.  Respiratory:  Negative for chest tightness and cough.   Cardiovascular:  Negative for chest pain.  Gastrointestinal:  Negative for abdominal distention, abdominal pain and blood in stool.  Endocrine: Negative for hot flashes.  Genitourinary:  Negative for difficulty urinating and frequency.   Musculoskeletal:  Negative for arthralgias.  Skin:  Negative for itching and rash.  Neurological:  Negative  for extremity weakness.  Hematological:  Negative for adenopathy.  Psychiatric/Behavioral:  Negative for confusion.     MEDICAL HISTORY:  Past Medical History:  Diagnosis Date   Anemia    Anxiety    Bariatric surgery status    Complication of anesthesia    Diificulty breathing for about 15 minutes after bariatric surgery   Constipation    COVID-19    Dysrhythmia    Elevated liver enzymes    GERD (gastroesophageal reflux disease)    Hemorrhoids    Herpes genitalis    High cholesterol    Hyperlipidemia    Hypertension    Hypothyroidism    IDA (iron deficiency anemia) 02/09/2021   Neuropathy    Osteoarthritis    Sleep apnea    CPAP    SURGICAL HISTORY: Past Surgical History:  Procedure Laterality Date   ABDOMINAL HYSTERECTOMY     total for fibroids no h/o abnormal pap   bariatric sleeve  2015   BREAST EXCISIONAL BIOPSY Left 1998   carpal tunnel repair     CATARACT EXTRACTION W/PHACO Right 11/01/2021   Procedure: CATARACT EXTRACTION PHACO AND INTRAOCULAR LENS PLACEMENT (IOC) RIGHT;  Surgeon: Nevada Crane, MD;  Location: [facility];  Service: Ophthalmology;  Laterality: Right;  sleep apnea 4.95 00:49.7   CATARACT EXTRACTION W/PHACO Left 11/15/2021   Procedure: CATARACT EXTRACTION PHACO AND INTRAOCULAR LENS PLACEMENT (IOC) LEFT 4.94 00:32.3;  Surgeon: Nevada Crane, MD;  Location: [facility];  Service: Ophthalmology;  Laterality: Left;  sleep apnea   COLONOSCOPY WITH PROPOFOL N/A 02/11/2016   Procedure: COLONOSCOPY WITH PROPOFOL;  Surgeon: Wyline Mood, MD;  Location: [facility] ENDOSCOPY;  Service: Endoscopy;  Laterality: N/A;   COLONOSCOPY WITH PROPOFOL N/A 10/01/2019   Procedure: COLONOSCOPY WITH PROPOFOL;  Surgeon: Wyline Mood, MD;  Location: [facility] ENDOSCOPY;  Service: Gastroenterology;  Laterality: N/A;   COLONOSCOPY WITH PROPOFOL N/A 11/19/2020   Procedure: COLONOSCOPY WITH PROPOFOL;  Surgeon: Wyline Mood, MD;  Location: [facility] ENDOSCOPY;  Service:  Gastroenterology;  Laterality: N/A;   COLONOSCOPY WITH PROPOFOL N/A 05/05/2022   Procedure: COLONOSCOPY WITH PROPOFOL;  Surgeon: Regis Bill, MD;  Location: [facility] ENDOSCOPY;  Service: Endoscopy;  Laterality: N/A;   ESOPHAGOGASTRODUODENOSCOPY (EGD) WITH PROPOFOL N/A 12/02/2019   Procedure: ESOPHAGOGASTRODUODENOSCOPY (EGD) WITH PROPOFOL;  Surgeon: Wyline Mood, MD;  Location: [facility] ENDOSCOPY;  Service: Gastroenterology;  Laterality: N/A;   ESOPHAGOGASTRODUODENOSCOPY (EGD) WITH PROPOFOL N/A 11/19/2020   Procedure: ESOPHAGOGASTRODUODENOSCOPY (EGD) WITH PROPOFOL;  Surgeon: Wyline Mood, MD;  Location: [facility] ENDOSCOPY;  Service: Gastroenterology;  Laterality: N/A;   ESOPHAGOGASTRODUODENOSCOPY (EGD) WITH PROPOFOL N/A 05/05/2022   Procedure: ESOPHAGOGASTRODUODENOSCOPY (EGD) WITH PROPOFOL;  Surgeon: Regis Bill, MD;  Location: [facility] ENDOSCOPY;  Service: Endoscopy;  Laterality: N/A;   FLEXIBLE SIGMOIDOSCOPY N/A 02/06/2021   Procedure: FLEXIBLE SIGMOIDOSCOPY;  Surgeon: Jaynie Collins, DO;  Location: [facility] ENDOSCOPY;  Service: Gastroenterology;  Laterality: N/A;   GIVENS CAPSULE STUDY N/A 06/07/2021   Procedure: GIVENS CAPSULE STUDY;  Surgeon: Toney Reil, MD;  Location: [facility] ENDOSCOPY;  Service: Gastroenterology;  Laterality: N/A;   GIVENS CAPSULE STUDY N/A 06/09/2021   Procedure: GIVENS CAPSULE STUDY;  Surgeon: Toney Reil, MD;  Location: [facility] ENDOSCOPY;  Service: Gastroenterology;  Laterality: N/A;   HEMORRHOID SURGERY     LAPAROSCOPIC GASTRIC RESTRICTIVE DUODENAL PROCEDURE (DUODENAL SWITCH) Bilateral    2020    SOCIAL HISTORY: Social History   Socioeconomic History   Marital status: Married    Spouse name: Not on file   Number of children: Not on file   Years of education: Not on file   Highest education level: Some college, no degree  Occupational History   Not on file  Tobacco Use   Smoking status: Former    Current packs/day: 0.00    Average packs/day: 1.5  packs/day for 24.0 years (36.0 ttl pk-yrs)    Types: Cigarettes    Start date: 55    Quit date: 65    Years since quitting: 31.6   Smokeless tobacco: Never   Tobacco comments:    quit 1995.   Vaping Use   Vaping status: Never Used  Substance and Sexual Activity   Alcohol use: Not Currently    Alcohol/week: 14.0 standard drinks of alcohol    Types: 14 Glasses of wine per week   Drug use: No   Sexual activity: Not Currently    Birth control/protection: Surgical    Comment: Hysterectomy  Other Topics Concern   Not on file  Social History Narrative   Lives in [place].    Married.    Retired 2015, Engineer, structural.    One son; granddaughter.    Left handed    Caffeine- decaf coffee.    Social Determinants of Health   Financial Resource Strain: Low Risk  (05/17/2022)   Overall Financial Resource Strain (CARDIA)    Difficulty of Paying Living Expenses: Not hard at all  Food Insecurity: No Food Insecurity (05/17/2022)   Hunger Vital Sign    Worried About Running Out of Food in the Last Year: Never true    Ran Out of Food in the Last Year: Never true  Transportation Needs: No Transportation Needs (05/17/2022)   PRAPARE - Administrator, Civil Service (Medical): No    Lack of Transportation (Non-Medical): No  Physical Activity: Unknown (05/17/2022)   Exercise Vital Sign    Days of Exercise per Week: 0 days    Minutes of Exercise per Session: Not on file  Stress: Stress Concern Present (05/17/2022)   [employer] of Occupational Health - Occupational Stress Questionnaire    Feeling of Stress : To some extent  Social Connections: Unknown (05/17/2022)   Social Connection and Isolation Panel [NHANES]    Frequency of Communication with Friends and Family: Twice a week    Frequency of Social Gatherings with Friends and Family: Patient declined    Attends Religious Services: Patient declined    Database administrator or Organizations: Patient declined     Attends Banker Meetings: Not on file    Marital Status: Married  Intimate Partner Violence: Not At Risk (05/04/2022)   Humiliation, Afraid, Rape, and Kick questionnaire    Fear of Current or Ex-Partner: No    Emotionally Abused: No    Physically Abused: No    Sexually Abused: No    FAMILY HISTORY: Family History  Problem Relation Age of Onset   Hypertension Mother    Heart disease Father    Alcohol abuse Father    Breast  cancer Sister 90       materal 1/2 sister   Hypertension Sister    Hypertension Brother     ALLERGIES:  is allergic to bee pollen, celecoxib, hydrocodone, pollen extract, sodium ferric gluconate [ferrous gluconate], nasacort [triamcinolone], and vicodin [hydrocodone-acetaminophen].  MEDICATIONS:  Current Outpatient Medications  Medication Sig Dispense Refill   acetaminophen (TYLENOL) 325 MG tablet Take 2 tablets (650 mg total) by mouth every 6 (six) hours as needed for mild pain (or Fever >/= 101).     acyclovir (ZOVIRAX) 400 MG tablet Take 1 tablet (400 mg total) by mouth daily. 30 tablet 5   FLUoxetine (PROZAC) 20 MG capsule Take 20 mg by mouth daily.     fluticasone (FLONASE) 50 MCG/ACT nasal spray SPRAY 1 SPRAY INTO BOTH NOSTRILS DAILY. 16 mL 2   folic acid (FOLVITE) 1 MG tablet Take 1 tablet (1 mg total) by mouth daily. 360 tablet 0   gabapentin (NEURONTIN) 100 MG capsule First week Take 100mg  with morning dose gabapentin for one week, second week add on lunch time dose of 100mg  , and 3rd week add on gabapentin 100mg  in the evening. 90 capsule 3   gabapentin (NEURONTIN) 300 MG capsule Take 3 capsules (900 mg total) by mouth 2 (two) times daily. 180 capsule 3   hydrocortisone (ANUSOL-HC) 25 MG suppository Unwrap and insert 1 suppository rectally twice a day 12 suppository 1   levothyroxine (SYNTHROID) 50 MCG tablet TAKE 1 TABLET (50 MCG TOTAL) BY MOUTH DAILY 30 tablet 5   losartan (COZAAR) 100 MG tablet TAKE 1 TABLET BY MOUTH EVERYDAY AT BEDTIME  (Patient taking differently: Take 100 mg by mouth daily. TAKE 1 TABLET BY MOUTH EVERYDAY AT BEDTIME) 90 tablet 3   Multiple Vitamin (MULTIVITAMIN WITH MINERALS) TABS tablet Take 1 tablet by mouth daily.     naltrexone (DEPADE) 50 MG tablet Take by mouth daily. Patient takes  one  50 mg tablet daily at the same time     pantoprazole (PROTONIX) 40 MG tablet Take 1 tablet (40 mg total) by mouth 2 (two) times daily. 180 tablet 3   potassium chloride SA (KLOR-CON M) 20 MEQ tablet Take 1 tablet (20 mEq total) by mouth daily.     rosuvastatin (CRESTOR) 5 MG tablet Take by mouth.     sodium chloride (OCEAN) 0.65 % SOLN nasal spray Place 2 sprays into both nostrils as needed for congestion. 30 mL 2   torsemide (DEMADEX) 20 MG tablet Take 1 tablet (20 mg total) by mouth daily.     traZODone (DESYREL) 50 MG tablet TAKE 1 TABLET BY MOUTH EVERY DAY FOR SLEEP 90 tablet 4   triamterene-hydrochlorothiazide (MAXZIDE-25) 37.5-25 MG tablet Take 1 tablet by mouth daily.     No current facility-administered medications for this visit.     PHYSICAL EXAMINATION: ECOG PERFORMANCE STATUS: 1 - Symptomatic but completely ambulatory Vitals:   09/14/22 1305  BP: 117/70  Pulse: (!) 52  Resp: 18  Temp: (!) 96.9 F (36.1 C)  SpO2: 100%   Filed Weights   09/14/22 1305  Weight: 169 lb 6.4 oz (76.8 kg)    Physical Exam Constitutional:      General: She is not in acute distress. HENT:     Head: Normocephalic and atraumatic.  Eyes:     General: No scleral icterus. Cardiovascular:     Rate and Rhythm: Normal rate and regular rhythm.     Heart sounds: Normal heart sounds.  Pulmonary:     Effort:  Pulmonary effort is normal. No respiratory distress.     Breath sounds: No wheezing.  Abdominal:     General: Bowel sounds are normal. There is no distension.     Palpations: Abdomen is soft.  Musculoskeletal:        General: No deformity. Normal range of motion.     Cervical back: Normal range of motion and neck  supple.  Skin:    General: Skin is warm and dry.     Findings: No erythema or rash.  Neurological:     Mental Status: She is alert and oriented to person, place, and time. Mental status is at baseline.     Cranial Nerves: No cranial nerve deficit.     Coordination: Coordination normal.  Psychiatric:        Mood and Affect: Mood normal.     LABORATORY DATA:  I have reviewed the data as listed    Latest Ref Rng & Units 09/07/2022    9:14 AM 07/06/2022    8:45 AM 05/05/2022    5:04 PM  CBC  WBC 4.0 - 10.5 K/uL 4.7  5.2    Hemoglobin 12.0 - 15.0 g/dL 38.7  8.6  8.9   Hematocrit 36.0 - 46.0 % 32.6  26.2  27.2   Platelets 150 - 400 K/uL 170  163        Latest Ref Rng & Units 09/07/2022    9:14 AM 08/17/2022   11:20 AM 05/05/2022    3:50 AM  CMP  Glucose 70 - 99 mg/dL 564  98  87   BUN 8 - 23 mg/dL 18  17  8    Creatinine 0.44 - 1.00 mg/dL 3.32  9.51  8.84   Sodium 135 - 145 mmol/L 133  135  140   Potassium 3.5 - 5.1 mmol/L 4.0  4.2  3.3   Chloride 98 - 111 mmol/L 99  96  111   CO2 22 - 32 mmol/L 26  30  25    Calcium 8.9 - 10.3 mg/dL 8.8  9.5  7.8   Total Protein 6.5 - 8.1 g/dL 6.9     Total Bilirubin 0.3 - 1.2 mg/dL 0.4     Alkaline Phos 38 - 126 U/L 102     AST 15 - 41 U/L 42     ALT 0 - 44 U/L 19       Lab Results  Component Value Date   IRON 37 09/07/2022   TIBC 298 09/07/2022   FERRITIN 83 09/07/2022     RADIOGRAPHIC STUDIES: I have personally reviewed the radiological images as listed and agreed with the findings in the report. DG Foot Complete Right  Result Date: 09/09/2022 Please see detailed radiograph report in office note.  MM 3D SCREENING MAMMOGRAM BILATERAL BREAST  Result Date: 08/29/2022 CLINICAL DATA:  Screening. EXAM: DIGITAL SCREENING BILATERAL MAMMOGRAM WITH TOMOSYNTHESIS AND CAD TECHNIQUE: Bilateral screening digital craniocaudal and mediolateral oblique mammograms were obtained. Bilateral screening digital breast tomosynthesis was performed. The  images were evaluated with computer-aided detection. COMPARISON:  Previous exam(s). ACR Breast Density Category b: There are scattered areas of fibroglandular density. FINDINGS: There are no findings suspicious for malignancy. IMPRESSION: No mammographic evidence of malignancy. A result letter of this screening mammogram will be mailed directly to the patient. RECOMMENDATION: Screening mammogram in one year. (Code:SM-B-01Y) BI-RADS CATEGORY  1: Negative. Electronically Signed   By: Amie Portland M.D.   On: 08/29/2022 08:32   DG Bone Density  Result Date: 08/25/2022  EXAM: DUAL X-RAY ABSORPTIOMETRY (DXA) FOR BONE MINERAL DENSITY IMPRESSION: Your patient Jamye Theune completed a BMD test on 08/25/2022 using the [employer] iDXA DXA System (software version: 14.10) manufactured by [employer]. The following summarizes the results of our evaluation. Technologist: [facility] PATIENT BIOGRAPHICAL: Name: Jennifer [name], Jennifer [name] Patient ID: [id number] Birth Date: [birth year] Height: 61.0 in. Gender: Female Exam Date: 08/25/2022 Weight: 169.1 lbs. Indications: Advanced Age, Hysterectomy, Oophorectomy Bilateral, Postmenopausal Fractures: Left finger Treatments: Calcium DENSITOMETRY RESULTS: Site      Region     Measured Date Measured Age WHO Classification Young Adult T-score BMD         %Change vs. Previous Significant Change (*) AP Spine L1-L4 (L3) 7/25/2021 73.0 Normal 0.8 1.287 g/cm2 2.6% Yes AP Spine L1-L4 (L3) [birth year]05/2018 69.3 Normal 0.6 1.254 g/cm2 - - DualFemur Neck Left 08/25/2022 73.0 Normal 0.0 1.039 g/cm2 -10.4% Yes DualFemur Neck Left 12/05/2018 69.3 Normal 0.9 1.160 g/cm2 - - DualFemur Total Mean 08/25/2022 73.0 Normal 0.9 1.117 g/cm2 -8.4% Yes DualFemur Total Mean 12/05/2018 69.3 Normal 1.7 1.220 g/cm2 - - ASSESSMENT: The BMD measured at Femur Neck Left is 1.039 g/cm2 with a T-score of 0.0. This patient is considered normal according to World Health Organization [facility]) criteria. The scan quality is good. L-3 was  excluded due to degenerative changes. Compared with prior study, there has been significant increase in the spine. Compared with prior study, there has been significant decrease in the total hip. World Science writer [facility]) criteria for post-menopausal, Caucasian Women: Normal:                   T-score at or above -1 SD Osteopenia/low bone mass: T-score between -1 and -2.5 SD Osteoporosis:             T-score at or below -2.5 SD RECOMMENDATIONS: 1. All patients should optimize calcium and vitamin D intake. 2. Consider FDA-approved medical therapies in postmenopausal women and men aged 20 years and older, based on the following: a. A hip or vertebral(clinical or morphometric) fracture b. T-score < -2.5 at the femoral neck or spine after appropriate evaluation to exclude secondary causes c. Low bone mass (T-score between -1.0 and -2.5 at the femoral neck or spine) and a 10-year probability of a hip fracture > 3% or a 10-year probability of a major osteoporosis-related fracture > 20% based on the [place]-adapted WHO algorithm 3. Clinician judgment and/or patient preferences may indicate treatment for people with 10-year fracture probabilities above or below these levels FOLLOW-UP: People with diagnosed cases of osteoporosis or at high risk for fracture should have regular bone mineral density tests. For patients eligible for Medicare, routine testing is allowed once every 2 years. The testing frequency can be increased to one year for patients who have rapidly progressing disease, those who are receiving or discontinuing medical therapy to restore bone mass, or have additional risk factors. I have reviewed this report, and agree with the above findings. [facility] Electronically Signed   By: Norva Pavlov M.D.   On: 08/25/2022 15:59   DG Thoracic Spine W/Swimmers  Result Date: 08/24/2022 CLINICAL DATA:  Pain. EXAM: THORACIC SPINE - 3 VIEWS COMPARISON:  None Available. FINDINGS: There is no  evidence of thoracic spine fracture. Alignment is normal. Degenerative changes identified throughout the thoracic spine with disc space narrowing and marginal osteophyte formation. No osteolytic or osteoblastic changes. IMPRESSION: Degenerative changes.  No acute osseous abnormalities Electronically Signed   By: Layla Maw M.D.   On:  08/24/2022 17:56   DG Abd 1 View  Result Date: 08/24/2022 CLINICAL DATA:  Abdominal pain. EXAM: ABDOMEN - 1 VIEW COMPARISON:  05/24/2019. FINDINGS: The bowel gas pattern is normal without gaseous distention. No radio-opaque calculi or other significant radiographic abnormality are seen. Increased stool noted consistent with constipation. IMPRESSION: Constipation. Electronically Signed   By: Layla Maw M.D.   On: 08/24/2022 17:53

## 2022-09-14 NOTE — Patient Instructions (Signed)
 Iron Sucrose Injection What is this medication? IRON SUCROSE (EYE ern SOO krose) treats low levels of iron (iron deficiency anemia) in people with kidney disease. Iron is a mineral that plays an important role in making red blood cells, which carry oxygen from your lungs to the rest of your body. This medicine may be used for other purposes; ask your health care provider or pharmacist if you have questions. COMMON BRAND NAME(S): Venofer What should I tell my care team before I take this medication? They need to know if you have any of these conditions: Anemia not caused by low iron levels Heart disease High levels of iron in the blood Kidney disease Liver disease An unusual or allergic reaction to iron, other medications, foods, dyes, or preservatives Pregnant or trying to get pregnant Breastfeeding How should I use this medication? This medication is for infusion into a vein. It is given in a hospital or clinic setting. Talk to your care team about the use of this medication in children. While this medication may be prescribed for children as young as 2 years for selected conditions, precautions do apply. Overdosage: If you think you have taken too much of this medicine contact a poison control center or emergency room at once. NOTE: This medicine is only for you. Do not share this medicine with others. What if I miss a dose? Keep appointments for follow-up doses. It is important not to miss your dose. Call your care team if you are unable to keep an appointment. What may interact with this medication? Do not take this medication with any of the following: Deferoxamine Dimercaprol Other iron products This medication may also interact with the following: Chloramphenicol Deferasirox This list may not describe all possible interactions. Give your health care provider a list of all the medicines, herbs, non-prescription drugs, or dietary supplements you use. Also tell them if you smoke,  drink alcohol, or use illegal drugs. Some items may interact with your medicine. What should I watch for while using this medication? Visit your care team regularly. Tell your care team if your symptoms do not start to get better or if they get worse. You may need blood work done while you are taking this medication. You may need to follow a special diet. Talk to your care team. Foods that contain iron include: whole grains/cereals, dried fruits, beans, or peas, leafy green vegetables, and organ meats (liver, kidney). What side effects may I notice from receiving this medication? Side effects that you should report to your care team as soon as possible: Allergic reactions--skin rash, itching, hives, swelling of the face, lips, tongue, or throat Low blood pressure--dizziness, feeling faint or lightheaded, blurry vision Shortness of breath Side effects that usually do not require medical attention (report to your care team if they continue or are bothersome): Flushing Headache Joint pain Muscle pain Nausea Pain, redness, or irritation at injection site This list may not describe all possible side effects. Call your doctor for medical advice about side effects. You may report side effects to FDA at 1-800-FDA-1088. Where should I keep my medication? This medication is given in a hospital or clinic. It will not be stored at home. NOTE: This sheet is a summary. It may not cover all possible information. If you have questions about this medicine, talk to your doctor, pharmacist, or health care provider.  2024 Elsevier/Gold Standard (2022-06-10 00:00:00)

## 2022-09-15 ENCOUNTER — Ambulatory Visit: Payer: Medicare Other | Attending: Family

## 2022-09-15 DIAGNOSIS — R2681 Unsteadiness on feet: Secondary | ICD-10-CM | POA: Insufficient documentation

## 2022-09-15 DIAGNOSIS — R262 Difficulty in walking, not elsewhere classified: Secondary | ICD-10-CM | POA: Insufficient documentation

## 2022-09-15 DIAGNOSIS — G629 Polyneuropathy, unspecified: Secondary | ICD-10-CM | POA: Diagnosis not present

## 2022-09-15 DIAGNOSIS — M6281 Muscle weakness (generalized): Secondary | ICD-10-CM | POA: Diagnosis not present

## 2022-09-15 NOTE — Therapy (Signed)
OUTPATIENT PHYSICAL THERAPY NEURO EVALUATION   Patient Name: Jennifer [name] MRN: [id number] DOB:09/30/1950, 72 y.o., female Today's Date: 09/15/2022   PCP: [name], FNP REFERRING PROVIDER: [name], FNP  END OF SESSION:  PT End of Session - 09/15/22 1622     Visit Number 1    Number of Visits 24    Date for PT Re-Evaluation 12/08/22    Progress Note Due on Visit 10    PT Start Time 1614    Equipment Utilized During Treatment Gait belt    Activity Tolerance Patient tolerated treatment well    Behavior During Therapy WFL for tasks assessed/performed             Past Medical History:  Diagnosis Date   Anemia    Anxiety    Bariatric surgery status    Complication of anesthesia    Diificulty breathing for about 15 minutes after bariatric surgery   Constipation    COVID-19    Dysrhythmia    Elevated liver enzymes    GERD (gastroesophageal reflux disease)    Hemorrhoids    Herpes genitalis    High cholesterol    Hyperlipidemia    Hypertension    Hypothyroidism    IDA (iron deficiency anemia) 02/09/2021   Neuropathy    Osteoarthritis    Sleep apnea    CPAP   Past Surgical History:  Procedure Laterality Date   ABDOMINAL HYSTERECTOMY     total for fibroids no h/o abnormal pap   bariatric sleeve  2015   BREAST EXCISIONAL BIOPSY Left 1998   carpal tunnel repair     CATARACT EXTRACTION W/PHACO Right 11/01/2021   Procedure: CATARACT EXTRACTION PHACO AND INTRAOCULAR LENS PLACEMENT (IOC) RIGHT;  Surgeon: Nevada Crane, MD;  Location: [facility];  Service: Ophthalmology;  Laterality: Right;  sleep apnea 4.95 00:49.7   CATARACT EXTRACTION W/PHACO Left 11/15/2021   Procedure: CATARACT EXTRACTION PHACO AND INTRAOCULAR LENS PLACEMENT (IOC) LEFT 4.94 00:32.3;  Surgeon: Nevada Crane, MD;  Location: [facility];  Service: Ophthalmology;  Laterality: Left;  sleep apnea   COLONOSCOPY WITH PROPOFOL N/A 02/11/2016   Procedure:  COLONOSCOPY WITH PROPOFOL;  Surgeon: Wyline Mood, MD;  Location: [facility] END[facility];  Service: Endoscopy;  Laterality: N/A;   COLONOSCOPY WITH PROPOFOL N/A 10/01/2019   Procedure: COLONOSCOPY WITH PROPOFOL;  Surgeon: Wyline Mood, MD;  Location: [facility] ENDOSCOPY;  Service: Gastroenterology;  Laterality: N/A;   COLONOSCOPY WITH PROPOFOL N/A 11/19/2020   Procedure: COLONOSCOPY WITH PROPOFOL;  Surgeon: Wyline Mood, MD;  Location: [facility];  Service: Gastroenterology;  Laterality: N/A;   COLONOSCOPY WITH PROPOFOL N/A 05/05/2022   Procedure: COLONOSCOPY WITH PROPOFOL;  Surgeon: Regis Bill, MD;  Location: [facility] ENDOSCOPY;  Service: Endoscopy;  Laterality: N/A;   ESOPHAGOGASTRODUODENOSCOPY (EGD) WITH PROPOFOL N/A 12/02/2019   Procedure: ESOPHAGOGASTRODUODENOSCOPY (EGD) WITH PROPOFOL;  Surgeon: Wyline Mood, MD;  Location: [facility] END[facility]COPY;  Service: Gastroenterology;  Laterality: N/A;   ESOPHAGOGASTRODUODENOSCOPY (EGD) WITH PROPOFOL N/A 11/19/2020   Procedure: ESOPHAGOGASTRODUODENOSCOPY (EGD) WITH PROPOFOL;  Surgeon: Wyline Mood, MD;  Location: [facility] ENDOSCOPY;  Service: Gastroenterology;  Laterality: N/A;   ESOPHAGOGASTRODUODENOSCOPY (EGD) WITH PROPOFOL N/A 05/05/2022   Procedure: ESOPHAGOGASTRODUODENOSCOPY (EGD) WITH PROPOFOL;  Surgeon: Regis Bill, MD;  Location: [facility] END[facility];  Service: Endoscopy;  Laterality: N/A;   FLEXIBLE SIGMOIDOSCOPY N/A 02/06/2021   Procedure: FLEXIBLE SIGMOIDOSCOPY;  Surgeon: Jaynie Collins, DO;  Location: [facility];  Service: Gastroenterology;  Laterality: N/A;   GIVENS CAPSULE STUDY N/A 06/07/2021   Procedure:  GIVENS CAPSULE STUDY;  Surgeon: Toney Reil, MD;  Location: [facility];  Service: Gastroenterology;  Laterality: N/A;   GIVENS CAPSULE STUDY N/A 06/09/2021   Procedure: GIVENS CAPSULE STUDY;  Surgeon: Toney Reil, MD;  Location: [facility] ENDOSCOPY;  Service: Gastroenterology;  Laterality: N/A;   HEMORRHOID SURGERY     LAPAROSCOPIC  GASTRIC RESTRICTIVE DUODENAL PROCEDURE (DUODENAL SWITCH) Bilateral    2020   Patient Active Problem List   Diagnosis Date Noted   Friction blister 07/22/2022   Acute on chronic anemia 05/03/2022   Melena 05/03/2022   Alcohol use disorder 05/03/2022   Transaminitis 11/22/2021   Tinea pedis 08/31/2021   Upper GI bleed 06/06/2021   Eye discharge 02/16/2021   IDA (iron deficiency anemia) 02/09/2021   Acute blood loss anemia 02/06/2021   Elevated liver function tests    Bright red blood per rectum 02/05/2021   Open wound of left heel 01/20/2021   Anxiety and depression 11/25/2020   CPAP (continuous positive airway pressure) dependence 11/25/2020   Sinusitis 11/25/2020   Allergic conjunctivitis of both eyes and rhinitis 11/25/2020   GI bleed 11/17/2020   Chronic iron deficiency anemia    Symptomatic anemia 11/11/2020   Postop check 08/07/2020   Biliary sludge determined by ultrasound 07/02/2020   Right upper quadrant abdominal pain 06/10/2020   Right-sided thoracic back pain 05/18/2020   Muscle spasm 05/18/2020   Bilateral tinnitus 04/30/2020   Irregular heartbeat 02/25/2020   Diabetes mellitus without complication (HCC) 02/25/2020   Strain of right knee 02/10/2020   Aspirin long-term use 10/22/2019   Wound ballistics 08/29/2019   Displacement of lumbar intervertebral disc without myelopathy 08/29/2019   Helicobacter pylori gastrointestinal tract infection 08/29/2019   Hypercholesterolemia 08/29/2019   Phlebitis after infusion 06/26/2019   Superficial thrombophlebitis of left upper extremity 06/26/2019   BMI 38.0-38.9,adult 06/19/2019   Small bowel obstruction (HCC) 05/25/2019   Morbid obesity (HCC) 05/16/2019   Cervical pain (neck) 04/09/2019   Pedal edema 03/06/2019   Pre-operative clearance 03/06/2019   History of sleeve gastrectomy 01/23/2019   Chronic pain syndrome 10/03/2018   Obstructive sleep apnea 10/03/2018   Right shoulder pain 06/08/2018   Chronic venous  insufficiency 05/24/2018   Lymphedema 05/24/2018   Lumbar spondylosis 05/17/2018   Leg swelling 05/09/2018   Neuropathy 01/24/2018   Insomnia 07/12/2017   Fracture of phalanx of finger 05/31/2017   Impingement syndrome of shoulder region 05/31/2017   Hemorrhoids 12/15/2016   Hypothyroidism 10/03/2016   Chronic shoulder bursitis 08/30/2016   Positive ANA (antinuclear antibody) 08/30/2016   Bradycardia 06/16/2016   History of adenomatous polyp of colon 05/02/2016   Elevated liver enzymes 04/06/2016   OSA (obstructive sleep apnea) 04/06/2016   Bilateral shoulder pain 01/13/2016   Fatty liver 12/08/2015   Arthritis 12/08/2015   History of bariatric surgery 12/07/2015   Genital herpes 11/11/2015   Hyperlipidemia 11/11/2015   Essential hypertension 11/11/2015   Routine physical examination 11/11/2015   Allergic rhinitis 11/11/2015   GERD (gastroesophageal reflux disease) 11/11/2015   Herpesviral infection, unspecified 01/08/2014   Disorder of thyroid, unspecified 01/08/2014   Lumbar radiculitis 11/19/2013   Neuritis or radiculitis due to rupture of lumbar intervertebral disc 11/19/2013   Monilial vaginitis 08/20/2013   Gastritis and duodenitis 07/25/2013   Impaired fasting glucose 07/17/2012   Obesity, unspecified 07/17/2012   Obesity 07/17/2012   Depression 03/15/2012   Abnormal electrocardiogram (ECG) (EKG) 03/06/2012   URI (upper respiratory infection) 01/16/2012    ONSET DATE: 1 and 1/2 years  ago  REFERRING DIAG: G62.9 (ICD-10-CM) - Neuropathy   THERAPY DIAG:  Difficulty in walking, not elsewhere classified - Plan: PT plan of care cert/re-cert  Muscle weakness (generalized) - Plan: PT plan of care cert/re-cert  Unsteadiness on feet - Plan: PT plan of care cert/re-cert  Rationale for Evaluation and Treatment: Rehabilitation  SUBJECTIVE:                                                                                                                                                                                              SUBJECTIVE STATEMENT: Patient reports here due to having some poor balance due to neuropathy.  Pt accompanied by: self  PERTINENT HISTORY: Jennifer [name] is a 72 year old female with past medical History of iron deficiency anemia (receiving iron infusions and recent right heel ulcer (healed per chart) and extensive medical history as reported in above section. Paitent was seen by PCP and diagnosed with gait/balance impairments.   PAIN:  Are you having pain? Yes: NPRS scale: 6/10 Pain location: bilateral lower legs into feet Pain description: Burning, tingling, numbness, cold Aggravating factors: increased standing > 15 min- worse pain at night Relieving factors: Gabapentin/tylenol, heating pad  PRECAUTIONS: Fall  RED FLAGS: None   WEIGHT BEARING RESTRICTIONS: No  FALLS: Has patient fallen in last 6 months? No  LIVING ENVIRONMENT: Lives with: lives with their spouse Lives in: House/apartment Stairs: Yes: External: 3-4 steps; on left going up Has following equipment at home: Single point cane and Grab bars  PLOF: Independent  PATIENT GOALS: Improve my balance.   OBJECTIVE:   DIAGNOSTIC FINDINGS: None avaible in chart  COGNITION: Overall cognitive status: Within functional limits for tasks assessed   SENSATION: Light touch: Impaired - Bilateral LE  COORDINATION: Slow/delay reaction  EDEMA:  None observed   POSTURE: forward head  LOWER EXTREMITY ROM:      LOWER EXTREMITY MMT:    MMT Right Eval Left Eval  Hip flexion 4- 4-  Hip extension 4- 4-  Hip abduction 4- 4-  Hip adduction 4 4  Hip internal rotation 3+ 3+  Hip external rotation 3+ 3+  Knee flexion 4 4  Knee extension 4 4  Ankle dorsiflexion 4 4  Ankle plantarflexion    Ankle inversion    Ankle eversion    (Blank rows = not tested)  TRANSFERS: Assistive device utilized: None  Sit to stand: Complete Independence Stand to sit:  Complete Independence Chair to chair: Complete Independence Floor:  Not tested    GAIT: Gait pattern: decreased step length- Right, decreased step length- Left, and shuffling unsteady Distance walked: 120 feet  Assistive device utilized: None Level of assistance: CGA Comments: unsteady intermittent  FUNCTIONAL TESTS:  5 times sit to stand: 27.61 sec without UE support  Timed up and go (TUG): 18.33 sec 10 meter walk test: 0.66 m/s Berg Balance Scale: 42/56  PATIENT SURVEYS:  FOTO 40  TODAY'S TREATMENT:                                                                                                                              DATE:   EVALUATION TODAY    PATIENT EDUCATION: Education details: Purpose of PT/plan of care Person educated: Patient Education method: Explanation Education comprehension: verbalized understanding  HOME EXERCISE PROGRAM: To be initiated next 1-2 visits  GOALS: Goals reviewed with patient? Yes  SHORT TERM GOALS: Target date: 10/27/2022  Pt will be independent with HEP in order to improve strength and balance in order to decrease fall risk and improve function at home and work.  Baseline: EVAL- Patient has no formal HEP in place Goal status: INITIAL   LONG TERM GOALS: Target date: 12/08/2022   1.  Patient (> 23 years old) will complete five times sit to stand test in < 15 seconds indicating an increased LE strength and improved balance. Baseline: EVAL= 27.61 sec Goal status: INITIAL  2.  Patient will increase FOTO score to equal to or greater than  54   to demonstrate statistically significant improvement in mobility and quality of life.  Baseline: EVAL= 40 Goal status: INITIAL   3.  Patient will increase Berg Balance score by > 6 points to demonstrate decreased fall risk during functional activities. Baseline: EVAL: 42/56 Goal status: INITIAL   4.  Patient will reduce timed up and go to <13 seconds to reduce fall risk and demonstrate  improved transfer/gait ability. Baseline: EVAL= 18.33 sec Goal status: INITIAL  5.  Patient will increase 10 meter walk test to >1.60m/s as to improve gait speed for better community ambulation and to reduce fall risk. Baseline: EVAL= 0.66 m/s Goal status: INITIAL ASSESSMENT:  CLINICAL IMPRESSION: Patient is a 72 y.o. female  who was seen today for physical therapy evaluation and treatment for Neuropathy. She presents with increased bilateral LE muscle weakness, impaired balance and functional mobility with decreased light touch sensation. She has good rehab potential and will benefit from skilled PT services to improve her functional mobility and strength for improved quality of life and decreased risk of falling.    OBJECTIVE IMPAIRMENTS: Abnormal gait, decreased activity tolerance, decreased balance, decreased coordination, decreased endurance, decreased mobility, difficulty walking, decreased strength, impaired sensation, and pain.   ACTIVITY LIMITATIONS: carrying, lifting, bending, standing, squatting, stairs, transfers, and bathing  PARTICIPATION LIMITATIONS: cleaning, laundry, shopping, community activity, and yard work  PERSONAL FACTORS: 3+ comorbidities: HTN, anemia, DM  are also affecting patient's functional outcome.   REHAB POTENTIAL: Good  CLINICAL DECISION MAKING: Stable/uncomplicated  EVALUATION COMPLEXITY: Moderate  PLAN:  PT FREQUENCY: 1-2x/week  PT DURATION: 12 weeks  PLANNED INTERVENTIONS: Therapeutic exercises, Therapeutic activity, Neuromuscular re-education, Balance training, Gait training, Patient/Family education, Self Care, Joint mobilization, Joint manipulation, Stair training, Vestibular training, Canalith repositioning, DME instructions, Dry Needling, Spinal manipulation, Spinal mobilization, Cryotherapy, Moist heat, Manual therapy, and Re-evaluation  PLAN FOR NEXT SESSION: Instruct in LE strengthening and balance and implement HEP.    Lenda Kelp, PT 09/15/2022, 5:38 PM

## 2022-09-20 ENCOUNTER — Ambulatory Visit: Payer: Medicare Other | Attending: Family

## 2022-09-20 DIAGNOSIS — M5459 Other low back pain: Secondary | ICD-10-CM | POA: Diagnosis not present

## 2022-09-20 DIAGNOSIS — M6281 Muscle weakness (generalized): Secondary | ICD-10-CM | POA: Diagnosis not present

## 2022-09-20 DIAGNOSIS — R2681 Unsteadiness on feet: Secondary | ICD-10-CM | POA: Insufficient documentation

## 2022-09-20 DIAGNOSIS — R262 Difficulty in walking, not elsewhere classified: Secondary | ICD-10-CM | POA: Diagnosis not present

## 2022-09-20 NOTE — Therapy (Signed)
OUTPATIENT PHYSICAL THERAPY NEURO TREATMENT  Patient Name: Elvine Lauinger MRN: [id number] DOB:11/25/1950, 72 y.o., female Today's Date: 09/20/2022   PCP: [name], FNP REFERRING PROVIDER: [name], FNP  END OF SESSION:  PT End of Session - 09/20/22 0847     Visit Number 2    Number of Visits 24    Date for PT Re-Evaluation 12/08/22    Progress Note Due on Visit 10    PT Start Time 0848    PT Stop Time 0928    PT Time Calculation (min) 40 min    Equipment Utilized During Treatment Gait belt    Activity Tolerance Patient tolerated treatment well    Behavior During Therapy WFL for tasks assessed/performed             Past Medical History:  Diagnosis Date   Anemia    Anxiety    Bariatric surgery status    Complication of anesthesia    Diificulty breathing for about 15 minutes after bariatric surgery   Constipation    COVID-19    Dysrhythmia    Elevated liver enzymes    GERD (gastroesophageal reflux disease)    Hemorrhoids    Herpes genitalis    High cholesterol    Hyperlipidemia    Hypertension    Hypothyroidism    IDA (iron deficiency anemia) 02/09/2021   Neuropathy    Osteoarthritis    Sleep apnea    CPAP   Past Surgical History:  Procedure Laterality Date   ABDOMINAL HYSTERECTOMY     total for fibroids no h/o abnormal pap   bariatric sleeve  2015   BREAST EXCISIONAL BIOPSY Left 1998   carpal tunnel repair     CATARACT EXTRACTION W/PHACO Right 11/01/2021   Procedure: CATARACT EXTRACTION PHACO AND INTRAOCULAR LENS PLACEMENT (IOC) RIGHT;  Surgeon: Nevada Crane, MD;  Location: [facility];  Service: Ophthalmology;  Laterality: Right;  sleep apnea 4.95 00:49.7   CATARACT EXTRACTION W/PHACO Left 11/15/2021   Procedure: CATARACT EXTRACTION PHACO AND INTRAOCULAR LENS PLACEMENT (IOC) LEFT 4.94 00:32.3;  Surgeon: Nevada Crane, MD;  Location: [facility];  Service: Ophthalmology;  Laterality: Left;  sleep apnea    COLONOSCOPY WITH PROPOFOL N/A 02/11/2016   Procedure: COLONOSCOPY WITH PROPOFOL;  Surgeon: Wyline Mood, MD;  Location: [facility];  Service: Endoscopy;  Laterality: N/A;   COLONOSCOPY WITH PROPOFOL N/A 10/01/2019   Procedure: COLONOSCOPY WITH PROPOFOL;  Surgeon: Wyline Mood, MD;  Location: [facility];  Service: Gastroenterology;  Laterality: N/A;   COLONOSCOPY WITH PROPOFOL N/A 11/19/2020   Procedure: COLONOSCOPY WITH PROPOFOL;  Surgeon: Wyline Mood, MD;  Location: [facility] END[facility]COPY;  Service: Gastroenterology;  Laterality: N/A;   COLONOSCOPY WITH PROPOFOL N/A 05/05/2022   Procedure: COLONOSCOPY WITH PROPOFOL;  Surgeon: Regis Bill, MD;  Location: [facility] END[facility];  Service: Endoscopy;  Laterality: N/A;   ESOPHAGOGASTRODUODENOSCOPY (EGD) WITH PROPOFOL N/A 12/02/2019   Procedure: ESOPHAGOGASTRODUODENOSCOPY (EGD) WITH PROPOFOL;  Surgeon: Wyline Mood, MD;  Location: [facility] END[facility]COPY;  Service: Gastroenterology;  Laterality: N/A;   ESOPHAGOGASTRODUODENOSCOPY (EGD) WITH PROPOFOL N/A 11/19/2020   Procedure: ESOPHAGOGASTRODUODENOSCOPY (EGD) WITH PROPOFOL;  Surgeon: Wyline Mood, MD;  Location: [facility] END[facility]COPY;  Service: Gastroenterology;  Laterality: N/A;   ESOPHAGOGASTRODUODENOSCOPY (EGD) WITH PROPOFOL N/A 05/05/2022   Procedure: ESOPHAGOGASTRODUODENOSCOPY (EGD) WITH PROPOFOL;  Surgeon: Regis Bill, MD;  Location: [facility];  Service: Endoscopy;  Laterality: N/A;   FLEXIBLE SIGMOIDOSCOPY N/A 02/06/2021   Procedure: FLEXIBLE SIGMOIDOSCOPY;  Surgeon: Jaynie Collins, DO;  Location: [facility] END[facility];  Service: Gastroenterology;  Laterality: N/A;   GIVENS CAPSULE STUDY N/A 06/07/2021   Procedure: GIVENS CAPSULE STUDY;  Surgeon: Toney Reil, MD;  Location: [facility];  Service: Gastroenterology;  Laterality: N/A;   GIVENS CAPSULE STUDY N/A 06/09/2021   Procedure: GIVENS CAPSULE STUDY;  Surgeon: Toney Reil, MD;  Location: [facility] ENDOSCOPY;  Service: Gastroenterology;   Laterality: N/A;   HEMORRHOID SURGERY     LAPAROSCOPIC GASTRIC RESTRICTIVE DUODENAL PROCEDURE (DUODENAL SWITCH) Bilateral    2020   Patient Active Problem List   Diagnosis Date Noted   Friction blister 07/22/2022   Acute on chronic anemia 05/03/2022   Melena 05/03/2022   Alcohol use disorder 05/03/2022   Transaminitis 11/22/2021   Tinea pedis 08/31/2021   Upper GI bleed 06/06/2021   Eye discharge 02/16/2021   IDA (iron deficiency anemia) 02/09/2021   Acute blood loss anemia 02/06/2021   Elevated liver function tests    Bright red blood per rectum 02/05/2021   Open wound of left heel 01/20/2021   Anxiety and depression 11/25/2020   CPAP (continuous positive airway pressure) dependence 11/25/2020   Sinusitis 11/25/2020   Allergic conjunctivitis of both eyes and rhinitis 11/25/2020   GI bleed 11/17/2020   Chronic iron deficiency anemia    Symptomatic anemia 11/11/2020   Postop check 08/07/2020   Biliary sludge determined by ultrasound 07/02/2020   Right upper quadrant abdominal pain 06/10/2020   Right-sided thoracic back pain 05/18/2020   Muscle spasm 05/18/2020   Bilateral tinnitus 04/30/2020   Irregular heartbeat 02/25/2020   Diabetes mellitus without complication (HCC) 02/25/2020   Strain of right knee 02/10/2020   Aspirin long-term use 10/22/2019   Wound ballistics 08/29/2019   Displacement of lumbar intervertebral disc without myelopathy 08/29/2019   Helicobacter pylori gastrointestinal tract infection 08/29/2019   Hypercholesterolemia 08/29/2019   Phlebitis after infusion 06/26/2019   Superficial thrombophlebitis of left upper extremity 06/26/2019   BMI 38.0-38.9,adult 06/19/2019   Small bowel obstruction (HCC) 05/25/2019   Morbid obesity (HCC) 05/16/2019   Cervical pain (neck) 04/09/2019   Pedal edema 03/06/2019   Pre-operative clearance 03/06/2019   History of sleeve gastrectomy 01/23/2019   Chronic pain syndrome 10/03/2018   Obstructive sleep apnea 10/03/2018    Right shoulder pain 06/08/2018   Chronic venous insufficiency 05/24/2018   Lymphedema 05/24/2018   Lumbar spondylosis 05/17/2018   Leg swelling 05/09/2018   Neuropathy 01/24/2018   Insomnia 07/12/2017   Fracture of phalanx of finger 05/31/2017   Impingement syndrome of shoulder region 05/31/2017   Hemorrhoids 12/15/2016   Hypothyroidism 10/03/2016   Chronic shoulder bursitis 08/30/2016   Positive ANA (antinuclear antibody) 08/30/2016   Bradycardia 06/16/2016   History of adenomatous polyp of colon 05/02/2016   Elevated liver enzymes 04/06/2016   OSA (obstructive sleep apnea) 04/06/2016   Bilateral shoulder pain 01/13/2016   Fatty liver 12/08/2015   Arthritis 12/08/2015   History of bariatric surgery 12/07/2015   Genital herpes 11/11/2015   Hyperlipidemia 11/11/2015   Essential hypertension 11/11/2015   Routine physical examination 11/11/2015   Allergic rhinitis 11/11/2015   GERD (gastroesophageal reflux disease) 11/11/2015   Herpesviral infection, unspecified 01/08/2014   Disorder of thyroid, unspecified 01/08/2014   Lumbar radiculitis 11/19/2013   Neuritis or radiculitis due to rupture of lumbar intervertebral disc 11/19/2013   Monilial vaginitis 08/20/2013   Gastritis and duodenitis 07/25/2013   Impaired fasting glucose 07/17/2012   Obesity, unspecified 07/17/2012   Obesity 07/17/2012   Depression 03/15/2012   Abnormal electrocardiogram (ECG) (EKG) 03/06/2012  URI (upper respiratory infection) 01/16/2012    ONSET DATE: 1 and 1/2 years ago  REFERRING DIAG: G62.9 (ICD-10-CM) - Neuropathy   THERAPY DIAG:  Difficulty in walking, not elsewhere classified  Muscle weakness (generalized)  Unsteadiness on feet  Rationale for Evaluation and Treatment: Rehabilitation  SUBJECTIVE:                                                                                                                                                                                              SUBJECTIVE STATEMENT: Patient reports doing okay today without new complaints    Pt accompanied by: self  PERTINENT HISTORY: Taetum Arratia is a 72 year old female with past medical History of iron deficiency anemia (receiving iron infusions and recent right heel ulcer (healed per chart) and extensive medical history as reported in above section. Paitent was seen by PCP and diagnosed with gait/balance impairments.   PAIN:  Are you having pain? Yes: NPRS scale: 6/10 Pain location: bilateral lower legs into feet Pain description: Burning, tingling, numbness, cold Aggravating factors: increased standing > 15 min- worse pain at night Relieving factors: Gabapentin/tylenol, heating pad  PRECAUTIONS: Fall  RED FLAGS: None   WEIGHT BEARING RESTRICTIONS: No  FALLS: Has patient fallen in last 6 months? No  LIVING ENVIRONMENT: Lives with: lives with their spouse Lives in: House/apartment Stairs: Yes: External: 3-4 steps; on left going up Has following equipment at home: Single point cane and Grab bars  PLOF: Independent  PATIENT GOALS: Improve my balance.   OBJECTIVE:   DIAGNOSTIC FINDINGS: None avaible in chart  COGNITION: Overall cognitive status: Within functional limits for tasks assessed   SENSATION: Light touch: Impaired - Bilateral LE  COORDINATION: Slow/delay reaction  EDEMA:  None observed   POSTURE: forward head  LOWER EXTREMITY ROM:      LOWER EXTREMITY MMT:    MMT Right Eval Left Eval  Hip flexion 4- 4-  Hip extension 4- 4-  Hip abduction 4- 4-  Hip adduction 4 4  Hip internal rotation 3+ 3+  Hip external rotation 3+ 3+  Knee flexion 4 4  Knee extension 4 4  Ankle dorsiflexion 4 4  Ankle plantarflexion    Ankle inversion    Ankle eversion    (Blank rows = not tested)  TRANSFERS: Assistive device utilized: None  Sit to stand: Complete Independence Stand to sit: Complete Independence Chair to chair: Complete Independence Floor:  Not  tested    GAIT: Gait pattern: decreased step length- Right, decreased step length- Left, and shuffling unsteady Distance walked: 120 feet Assistive device utilized: None Level of assistance: CGA Comments:  unsteady intermittent  FUNCTIONAL TESTS:  5 times sit to stand: 27.61 sec without UE support  Timed up and go (TUG): 18.33 sec 10 meter walk test: 0.66 m/s Berg Balance Scale: 42/56  PATIENT SURVEYS:  FOTO 40  TODAY'S TREATMENT:                                                                                                                              DATE:   Therex: Instructed in basic seated LE strengthening exercises that she can incorporate at home for long term success  Seated hip march 3# AW x 10 reps each (patient reported as tiring)  Seated knee ext 3 # AW x 10 reps each (patient reported as fatiguing)  Seated hip IR with 3# AW x 10 reps each LE Seated Hip ER with 3# AW x 10 reps each LE.  Seated ham curl with 3# AW x 10 reps each LE Sit to stand without UE support x 10 reps.    PATIENT EDUCATION: Education details: Purpose of PT/plan of care Person educated: Patient Education method: Explanation Education comprehension: verbalized understanding  HOME EXERCISE PROGRAM:  *Issued RTB for home use Access Code: [email or web address] URL: [email or web address] Date: 09/20/2022 Prepared by: Maureen Ralphs  Exercises - Seated Hip Flexion March with Ankle Weights  - 1 x daily - 3 x weekly - 3 sets - 10 reps - Seated Long Arc Quad with Ankle Weight  - 1 x daily - 3 x weekly - 3 sets - 10 reps - Seated Hip Flexion and External Rotation  - 1 x daily - 3 x weekly - 3 sets - 10 reps - Seated Hip Internal Rotation with Resistance  - 1 x daily - 3 x weekly - 3 sets - 10 reps - Seated Hamstring Curl with Anchored Resistance  - 1 x daily - 3 x weekly - 3 sets - 10 reps - Sit to Stand with Arms Crossed  - 1 x daily - 3 x weekly - 3 sets - 10 reps  GOALS: Goals  reviewed with patient? Yes  SHORT TERM GOALS: Target date: 10/27/2022  Pt will be independent with HEP in order to improve strength and balance in order to decrease fall risk and improve function at home and work.  Baseline: EVAL- Patient has no formal HEP in place Goal status: INITIAL   LONG TERM GOALS: Target date: 12/08/2022   1.  Patient (> 79 years old) will complete five times sit to stand test in < 15 seconds indicating an increased LE strength and improved balance. Baseline: EVAL= 27.61 sec Goal status: INITIAL  2.  Patient will increase FOTO score to equal to or greater than  54   to demonstrate statistically significant improvement in mobility and quality of life.  Baseline: EVAL= 40 Goal status: INITIAL   3.  Patient will increase Berg Balance score by > 6 points to demonstrate decreased fall risk during functional activities. Baseline: EVAL:  42/56 Goal status: INITIAL   4.  Patient will reduce timed up and go to <13 seconds to reduce fall risk and demonstrate improved transfer/gait ability. Baseline: EVAL= 18.33 sec Goal status: INITIAL  5.  Patient will increase 10 meter walk test to >1.7m/s as to improve gait speed for better community ambulation and to reduce fall risk. Baseline: EVAL= 0.66 m/s Goal status: INITIAL ASSESSMENT:  CLINICAL IMPRESSION: Treatment #2 consisted of initiating some basic LE strengthening exercises to assist with imbalance. Patient arrived with good motivation to receive instruction and verbalized understanding and return demonstration of prescribed exercises today. Issued handout today and patient denied any pain- able to follow all instructions- may benefit from review next visit and if no issues- will plan to proceed to standing LE strengthening and basic static/dynamic balance activities. Patient will benefit from skilled PT services to improve her functional mobility and strength for improved quality of life and decreased risk of falling.     OBJECTIVE IMPAIRMENTS: Abnormal gait, decreased activity tolerance, decreased balance, decreased coordination, decreased endurance, decreased mobility, difficulty walking, decreased strength, impaired sensation, and pain.   ACTIVITY LIMITATIONS: carrying, lifting, bending, standing, squatting, stairs, transfers, and bathing  PARTICIPATION LIMITATIONS: cleaning, laundry, shopping, community activity, and yard work  PERSONAL FACTORS: 3+ comorbidities: HTN, anemia, DM  are also affecting patient's functional outcome.   REHAB POTENTIAL: Good  CLINICAL DECISION MAKING: Stable/uncomplicated  EVALUATION COMPLEXITY: Moderate  PLAN:  PT FREQUENCY: 1-2x/week  PT DURATION: 12 weeks  PLANNED INTERVENTIONS: Therapeutic exercises, Therapeutic activity, Neuromuscular re-education, Balance training, Gait training, Patient/Family education, Self Care, Joint mobilization, Joint manipulation, Stair training, Vestibular training, Canalith repositioning, DME instructions, Dry Needling, Spinal manipulation, Spinal mobilization, Cryotherapy, Moist heat, Manual therapy, and Re-evaluation  PLAN FOR NEXT SESSION: Instruct in LE strengthening and balance and review/progress HEP.    Lenda Kelp, PT 09/20/2022, 9:48 AM

## 2022-09-21 NOTE — Therapy (Unsigned)
OUTPATIENT PHYSICAL THERAPY NEURO TREATMENT  Patient Name: Jennifer [name] MRN: [id number] DOB:[birth year], 72 y.o., female Today's Date: 09/22/2022   PCP: [name], FNP REFERRING PROVIDER: [name], FNP  END OF SESSION:  PT End of Session - 09/22/22 1354     Visit Number 3    Number of Visits 24    Date for PT Re-Evaluation 12/08/22    Progress Note Due on Visit 10    PT Start Time 1400    PT Stop Time 1442    PT Time Calculation (min) 42 min    Equipment Utilized During Treatment Gait belt    Activity Tolerance Patient tolerated treatment well    Behavior During Therapy WFL for tasks assessed/performed              Past Medical History:  Diagnosis Date   Anemia    Anxiety    Bariatric surgery status    Complication of anesthesia    Diificulty breathing for about 15 minutes after bariatric surgery   Constipation    COVID-19    Dysrhythmia    Elevated liver enzymes    GERD (gastroesophageal reflux disease)    Hemorrhoids    Herpes genitalis    High cholesterol    Hyperlipidemia    Hypertension    Hypothyroidism    IDA (iron deficiency anemia) 02/09/2021   Neuropathy    Osteoarthritis    Sleep apnea    CPAP   Past Surgical History:  Procedure Laterality Date   ABDOMINAL HYSTERECTOMY     total for fibroids no h/o abnormal pap   bariatric sleeve  2015   BREAST EXCISIONAL BIOPSY Left 1998   carpal tunnel repair     CATARACT EXTRACTION W/PHACO Right 11/01/2021   Procedure: CATARACT EXTRACTION PHACO AND INTRAOCULAR LENS PLACEMENT (IOC) RIGHT;  Surgeon: Nevada Crane, MD;  Location: [facility];  Service: Ophthalmology;  Laterality: Right;  sleep apnea 4.95 00:49.7   CATARACT EXTRACTION W/PHACO Left 11/15/2021   Procedure: CATARACT EXTRACTION PHACO AND INTRAOCULAR LENS PLACEMENT (IOC) LEFT 4.94 00:32.3;  Surgeon: Nevada Crane, MD;  Location: [facility];  Service: Ophthalmology;  Laterality: Left;  sleep apnea    COLONOSCOPY WITH PROPOFOL N/A 02/11/2016   Procedure: COLONOSCOPY WITH PROPOFOL;  Surgeon: Wyline Mood, MD;  Location: [facility] ENDOSCOPY;  Service: Endoscopy;  Laterality: N/A;   COLONOSCOPY WITH PROPOFOL N/A 10/01/2019   Procedure: COLONOSCOPY WITH PROPOFOL;  Surgeon: Wyline Mood, MD;  Location: [facility] ENDOSCOPY;  Service: Gastroenterology;  Laterality: N/A;   COLONOSCOPY WITH PROPOFOL N/A 11/19/2020   Procedure: COLONOSCOPY WITH PROPOFOL;  Surgeon: Wyline Mood, MD;  Location: [facility] ENDOSCOPY;  Service: Gastroenterology;  Laterality: N/A;   COLONOSCOPY WITH PROPOFOL N/A 05/05/2022   Procedure: COLONOSCOPY WITH PROPOFOL;  Surgeon: Regis Bill, MD;  Location: [facility] ENDOSCOPY;  Service: Endoscopy;  Laterality: N/A;   ESOPHAGOGASTRODUODENOSCOPY (EGD) WITH PROPOFOL N/A 12/02/2019   Procedure: ESOPHAGOGASTRODUODENOSCOPY (EGD) WITH PROPOFOL;  Surgeon: Wyline Mood, MD;  Location: [facility] ENDOSCOPY;  Service: Gastroenterology;  Laterality: N/A;   ESOPHAGOGASTRODUODENOSCOPY (EGD) WITH PROPOFOL N/A 11/19/2020   Procedure: ESOPHAGOGASTRODUODENOSCOPY (EGD) WITH PROPOFOL;  Surgeon: Wyline Mood, MD;  Location: [facility] ENDOSCOPY;  Service: Gastroenterology;  Laterality: N/A;   ESOPHAGOGASTRODUODENOSCOPY (EGD) WITH PROPOFOL N/A 05/05/2022   Procedure: ESOPHAGOGASTRODUODENOSCOPY (EGD) WITH PROPOFOL;  Surgeon: Regis Bill, MD;  Location: [facility] ENDOSCOPY;  Service: Endoscopy;  Laterality: N/A;   FLEXIBLE SIGMOIDOSCOPY N/A 02/06/2021   Procedure: FLEXIBLE SIGMOIDOSCOPY;  Surgeon: Jaynie Collins, DO;  Location: [facility] ENDOSCOPY;  Service: Gastroenterology;  Laterality: N/A;   GIVENS CAPSULE STUDY N/A 06/07/2021   Procedure: GIVENS CAPSULE STUDY;  Surgeon: Toney Reil, MD;  Location: [facility] ENDOSCOPY;  Service: Gastroenterology;  Laterality: N/A;   GIVENS CAPSULE STUDY N/A 06/09/2021   Procedure: GIVENS CAPSULE STUDY;  Surgeon: Toney Reil, MD;  Location: [facility] ENDOSCOPY;  Service: Gastroenterology;   Laterality: N/A;   HEMORRHOID SURGERY     LAPAROSCOPIC GASTRIC RESTRICTIVE DUODENAL PROCEDURE (DUODENAL SWITCH) Bilateral    2020   Patient Active Problem List   Diagnosis Date Noted   Friction blister 07/22/2022   Acute on chronic anemia 05/03/2022   Melena 05/03/2022   Alcohol use disorder 05/03/2022   Transaminitis 11/22/2021   Tinea pedis 08/31/2021   Upper GI bleed 06/06/2021   Eye discharge 02/16/2021   IDA (iron deficiency anemia) 02/09/2021   Acute blood loss anemia 02/06/2021   Elevated liver function tests    Bright red blood per rectum 02/05/2021   Open wound of left heel 01/20/2021   Anxiety and depression 11/25/2020   CPAP (continuous positive airway pressure) dependence 11/25/2020   Sinusitis 11/25/2020   Allergic conjunctivitis of both eyes and rhinitis 11/25/2020   GI bleed 11/17/2020   Chronic iron deficiency anemia    Symptomatic anemia 11/11/2020   Postop check 08/07/2020   Biliary sludge determined by ultrasound 07/02/2020   Right upper quadrant abdominal pain 06/10/2020   Right-sided thoracic back pain 05/18/2020   Muscle spasm 05/18/2020   Bilateral tinnitus 04/30/2020   Irregular heartbeat 02/25/2020   Diabetes mellitus without complication (HCC) 02/25/2020   Strain of right knee 02/10/2020   Aspirin long-term use 10/22/2019   Wound ballistics 08/29/2019   Displacement of lumbar intervertebral disc without myelopathy 08/29/2019   Helicobacter pylori gastrointestinal tract infection 08/29/2019   Hypercholesterolemia 08/29/2019   Phlebitis after infusion 06/26/2019   Superficial thrombophlebitis of left upper extremity 06/26/2019   BMI 38.0-38.9,adult 06/19/2019   Small bowel obstruction (HCC) 05/25/2019   Morbid obesity (HCC) 05/16/2019   Cervical pain (neck) 04/09/2019   Pedal edema 03/06/2019   Pre-operative clearance 03/06/2019   History of sleeve gastrectomy 01/23/2019   Chronic pain syndrome 10/03/2018   Obstructive sleep apnea 10/03/2018    Right shoulder pain 06/08/2018   Chronic venous insufficiency 05/24/2018   Lymphedema 05/24/2018   Lumbar spondylosis 05/17/2018   Leg swelling 05/09/2018   Neuropathy 01/24/2018   Insomnia 07/12/2017   Fracture of phalanx of finger 05/31/2017   Impingement syndrome of shoulder region 05/31/2017   Hemorrhoids 12/15/2016   Hypothyroidism 10/03/2016   Chronic shoulder bursitis 08/30/2016   Positive ANA (antinuclear antibody) 08/30/2016   Bradycardia 06/16/2016   History of adenomatous polyp of colon 05/02/2016   Elevated liver enzymes 04/06/2016   OSA (obstructive sleep apnea) 04/06/2016   Bilateral shoulder pain 01/13/2016   Fatty liver 12/08/2015   Arthritis 12/08/2015   History of bariatric surgery 12/07/2015   Genital herpes 11/11/2015   Hyperlipidemia 11/11/2015   Essential hypertension 11/11/2015   Routine physical examination 11/11/2015   Allergic rhinitis 11/11/2015   GERD (gastroesophageal reflux disease) 11/11/2015   Herpesviral infection, unspecified 01/08/2014   Disorder of thyroid, unspecified 01/08/2014   Lumbar radiculitis 11/19/2013   Neuritis or radiculitis due to rupture of lumbar intervertebral disc 11/19/2013   Monilial vaginitis 08/20/2013   Gastritis and duodenitis 07/25/2013   Impaired fasting glucose 07/17/2012   Obesity, unspecified 07/17/2012   Obesity 07/17/2012   Depression 03/15/2012   Abnormal electrocardiogram (ECG) (EKG) 03/06/2012  URI (upper respiratory infection) 01/16/2012    ONSET DATE: 1 and 1/2 years ago  REFERRING DIAG: G62.9 (ICD-10-CM) - Neuropathy   THERAPY DIAG:  Difficulty in walking, not elsewhere classified  Muscle weakness (generalized)  Unsteadiness on feet  Other low back pain  Rationale for Evaluation and Treatment: Rehabilitation  SUBJECTIVE:                                                                                                                                                                                              SUBJECTIVE STATEMENT: Patient reports doing okay today wand is having some discomfort in her hand this date she describes as cramping.     Pt accompanied by: self  PERTINENT HISTORY: Reann Curtain is a 72 year old female with past medical History of iron deficiency anemia (receiving iron infusions and recent right heel ulcer (healed per chart) and extensive medical history as reported in above section. Paitent was seen by PCP and diagnosed with gait/balance impairments.   PAIN:  Are you having pain? Yes: NPRS scale: 6/10 Pain location: bilateral lower legs into feet Pain description: Burning, tingling, numbness, cold Aggravating factors: increased standing > 15 min- worse pain at night Relieving factors: Gabapentin/tylenol, heating pad  PRECAUTIONS: Fall  RED FLAGS: None   WEIGHT BEARING RESTRICTIONS: No  FALLS: Has patient fallen in last 6 months? No  LIVING ENVIRONMENT: Lives with: lives with their spouse Lives in: House/apartment Stairs: Yes: External: 3-4 steps; on left going up Has following equipment at home: Single point cane and Grab bars  PLOF: Independent  PATIENT GOALS: Improve my balance.   OBJECTIVE:   DIAGNOSTIC FINDINGS: None avaible in chart  COGNITION: Overall cognitive status: Within functional limits for tasks assessed   SENSATION: Light touch: Impaired - Bilateral LE  COORDINATION: Slow/delay reaction  EDEMA:  None observed   POSTURE: forward head  LOWER EXTREMITY ROM:      LOWER EXTREMITY MMT:    MMT Right Eval Left Eval  Hip flexion 4- 4-  Hip extension 4- 4-  Hip abduction 4- 4-  Hip adduction 4 4  Hip internal rotation 3+ 3+  Hip external rotation 3+ 3+  Knee flexion 4 4  Knee extension 4 4  Ankle dorsiflexion 4 4  Ankle plantarflexion    Ankle inversion    Ankle eversion    (Blank rows = not tested)  TRANSFERS: Assistive device utilized: None  Sit to stand: Complete Independence Stand to  sit: Complete Independence Chair to chair: Complete Independence Floor:  Not tested    GAIT: Gait pattern: decreased step length- Right, decreased step length-  Left, and shuffling unsteady Distance walked: 120 feet Assistive device utilized: None Level of assistance: CGA Comments: unsteady intermittent  FUNCTIONAL TESTS:  5 times sit to stand: 27.61 sec without UE support  Timed up and go (TUG): 18.33 sec 10 meter walk test: 0.66 m/s Berg Balance Scale: 42/56  PATIENT SURVEYS:  FOTO 40  TODAY'S TREATMENT:                                                                                                                              DATE:   Therex:  Nustep level 2 x 3 min intervals x 2 rounds LE only   Seated hip march 3# AW 2 x 10 reps each  Seated knee ext 3 # AW 2 x 10 reps each   NMR Standing on airex pad x 60 sec adducted stance, good balance, no significant sway  - added velcro dart toss x 3 rounds for dynamic weight shift and dual task integration. Pt enjoyed and was challenged with intervention.   Side step to and form airex x 10 ea side   Step forward and off of airex x 10 ea LE   Unless otherwise stated, CGA was provided and gait belt donned in order to ensure pt safety   PATIENT EDUCATION: Education details: Purpose of PT/plan of care Person educated: Patient Education method: Explanation Education comprehension: verbalized understanding  HOME EXERCISE PROGRAM:  *Issued RTB for home use Access Code: [email or web address] URL: [email or web address] Date: 09/20/2022 Prepared by: Maureen Ralphs  Exercises - Seated Hip Flexion March with Ankle Weights  - 1 x daily - 3 x weekly - 3 sets - 10 reps - Seated Long Arc Quad with Ankle Weight  - 1 x daily - 3 x weekly - 3 sets - 10 reps - Seated Hip Flexion and External Rotation  - 1 x daily - 3 x weekly - 3 sets - 10 reps - Seated Hip Internal Rotation with Resistance  - 1 x daily - 3 x weekly - 3 sets -  10 reps - Seated Hamstring Curl with Anchored Resistance  - 1 x daily - 3 x weekly - 3 sets - 10 reps - Sit to Stand with Arms Crossed  - 1 x daily - 3 x weekly - 3 sets - 10 reps  GOALS: Goals reviewed with patient? Yes  SHORT TERM GOALS: Target date: 10/27/2022  Pt will be independent with HEP in order to improve strength and balance in order to decrease fall risk and improve function at home and work.  Baseline: EVAL- Patient has no formal HEP in place Goal status: INITIAL   LONG TERM GOALS: Target date: 12/08/2022   1.  Patient (> 98 years old) will complete five times sit to stand test in < 15 seconds indicating an increased LE strength and improved balance. Baseline: EVAL= 27.61 sec Goal status: INITIAL  2.  Patient will increase FOTO score to equal to or greater than  54   to demonstrate statistically significant improvement in mobility and quality of life.  Baseline: EVAL= 40 Goal status: INITIAL   3.  Patient will increase Berg Balance score by > 6 points to demonstrate decreased fall risk during functional activities. Baseline: EVAL: 42/56 Goal status: INITIAL   4.  Patient will reduce timed up and go to <13 seconds to reduce fall risk and demonstrate improved transfer/gait ability. Baseline: EVAL= 18.33 sec Goal status: INITIAL  5.  Patient will increase 10 meter walk test to >1.84m/s as to improve gait speed for better community ambulation and to reduce fall risk. Baseline: EVAL= 0.66 m/s Goal status: INITIAL ASSESSMENT:  CLINICAL IMPRESSION:  Pt continues with LE strengthening interventions. Pt challenged with balance interventions. Pt responds well to all interventions and is eager to continue to progress. Pt will continue to benefit from skilled physical therapy intervention to address impairments, improve QOL, and attain therapy goals.     OBJECTIVE IMPAIRMENTS: Abnormal gait, decreased activity tolerance, decreased balance, decreased coordination, decreased  endurance, decreased mobility, difficulty walking, decreased strength, impaired sensation, and pain.   ACTIVITY LIMITATIONS: carrying, lifting, bending, standing, squatting, stairs, transfers, and bathing  PARTICIPATION LIMITATIONS: cleaning, laundry, shopping, community activity, and yard work  PERSONAL FACTORS: 3+ comorbidities: HTN, anemia, DM  are also affecting patient's functional outcome.   REHAB POTENTIAL: Good  CLINICAL DECISION MAKING: Stable/uncomplicated  EVALUATION COMPLEXITY: Moderate  PLAN:  PT FREQUENCY: 1-2x/week  PT DURATION: 12 weeks  PLANNED INTERVENTIONS: Therapeutic exercises, Therapeutic activity, Neuromuscular re-education, Balance training, Gait training, Patient/Family education, Self Care, Joint mobilization, Joint manipulation, Stair training, Vestibular training, Canalith repositioning, DME instructions, Dry Needling, Spinal manipulation, Spinal mobilization, Cryotherapy, Moist heat, Manual therapy, and Re-evaluation  PLAN FOR NEXT SESSION: Instruct in LE strengthening and balance and review/progress HEP.    Norman Herrlich, PT 09/22/2022, 1:55 PM

## 2022-09-22 ENCOUNTER — Encounter: Payer: Self-pay | Admitting: Family

## 2022-09-22 ENCOUNTER — Telehealth: Payer: Self-pay | Admitting: Family

## 2022-09-22 ENCOUNTER — Ambulatory Visit (INDEPENDENT_AMBULATORY_CARE_PROVIDER_SITE_OTHER): Payer: Medicare Other | Admitting: Family

## 2022-09-22 ENCOUNTER — Ambulatory Visit: Payer: Medicare Other | Admitting: Physical Therapy

## 2022-09-22 VITALS — BP 118/76 | HR 57 | Temp 98.1°F | Ht 61.0 in | Wt 168.8 lb

## 2022-09-22 DIAGNOSIS — R2681 Unsteadiness on feet: Secondary | ICD-10-CM

## 2022-09-22 DIAGNOSIS — E039 Hypothyroidism, unspecified: Secondary | ICD-10-CM | POA: Diagnosis not present

## 2022-09-22 DIAGNOSIS — G8929 Other chronic pain: Secondary | ICD-10-CM

## 2022-09-22 DIAGNOSIS — Z23 Encounter for immunization: Secondary | ICD-10-CM | POA: Diagnosis not present

## 2022-09-22 DIAGNOSIS — M6281 Muscle weakness (generalized): Secondary | ICD-10-CM | POA: Diagnosis not present

## 2022-09-22 DIAGNOSIS — M5459 Other low back pain: Secondary | ICD-10-CM

## 2022-09-22 DIAGNOSIS — R262 Difficulty in walking, not elsewhere classified: Secondary | ICD-10-CM

## 2022-09-22 DIAGNOSIS — M546 Pain in thoracic spine: Secondary | ICD-10-CM

## 2022-09-22 MED ORDER — GABAPENTIN 300 MG PO CAPS
900.0000 mg | ORAL_CAPSULE | Freq: Two times a day (BID) | ORAL | 3 refills | Status: DC
Start: 2022-09-22 — End: 2022-11-25

## 2022-09-22 MED ORDER — LEVOTHYROXINE SODIUM 50 MCG PO TABS
50.0000 ug | ORAL_TABLET | Freq: Every day | ORAL | 5 refills | Status: DC
Start: 2022-09-22 — End: 2022-09-22

## 2022-09-22 MED ORDER — GABAPENTIN 100 MG PO CAPS
ORAL_CAPSULE | ORAL | 3 refills | Status: DC
Start: 2022-09-22 — End: 2022-11-25

## 2022-09-22 MED ORDER — LEVOTHYROXINE SODIUM 50 MCG PO TABS
50.0000 ug | ORAL_TABLET | Freq: Every day | ORAL | 3 refills | Status: DC
Start: 2022-09-22 — End: 2023-10-06

## 2022-09-22 NOTE — Telephone Encounter (Signed)
No problem. Amy can you reach out to Jennifer [name] and get her schedule for regular follow-up. Not urgent but overdue to be seen and help address issues she is having with her CPAP

## 2022-09-22 NOTE — Telephone Encounter (Signed)
Lanora Manis,  [facility] you are well  Pt continue to c/o condensation in Cipap. She also never increased setting from last year ; I reviewed your note from 04/2021  Do you mind having your office to reach out to her?

## 2022-09-22 NOTE — Telephone Encounter (Signed)
Left message on mobile and home numbers for patient to call clinic to schedule follow-up visit.

## 2022-09-22 NOTE — Progress Notes (Signed)
Assessment & Plan:  Encounter for immunization -     Flu Vaccine Trivalent High Dose (Fluad)  Hypothyroidism, unspecified -     Levothyroxine Sodium; Take 1 tablet (50 mcg total) by mouth daily.  Dispense: 90 tablet; Refill: 3  Chronic right-sided thoracic back pain Assessment & Plan: Chronic, stable.  Continue gabapentin 1000 mg twice daily.  Patient has started physical therapy.  Will follow  Orders: -     Gabapentin; Take 100mg  with morning and evening dose gabapentin 900mg .  Dispense: 180 capsule; Refill: 3 -     Gabapentin; Take 3 capsules (900 mg total) by mouth 2 (two) times daily.  Dispense: 180 capsule; Refill: 3     Return precautions given.   Risks, benefits, and alternatives of the medications and treatment plan prescribed today were discussed, and patient expressed understanding.   Education regarding symptom management and diagnosis given to patient on AVS either electronically or printed.  Return in about 4 months (around 01/22/2023).  [name], FNP  Subjective:    Patient ID: Jennifer [name], female    DOB: Nov 16, 1950, 72 y.o.   MRN: [id number]  CC: Jennifer [name] is a 72 y.o. female who presents today for follow up.   HPI: Feels well today.  No new complaints   Advised to increase gabapentin to gabapentin 1000mg  qam, 100mg  midday and 1000mg  at bedtime however she didn't start gabapentin at midday.   She feels dose 1000mg  BID is adeqaute for back pain. She started PT last week.    She has been sober for 3 months She is following with counselor through the [facility].   Follow-up Dr. Cathie Hoops last month for iron deficient anemia due to chronic blood loss.  Recommended additional IV Venofer treatment  Allergies: Bee pollen, Celecoxib, Hydrocodone, Pollen extract, Sodium ferric gluconate [ferrous gluconate], Nasacort [triamcinolone], and Vicodin [hydrocodone-acetaminophen] Current Outpatient Medications on File Prior to Visit  Medication Sig  Dispense Refill   acetaminophen (TYLENOL) 325 MG tablet Take 2 tablets (650 mg total) by mouth every 6 (six) hours as needed for mild pain (or Fever >/= 101).     acyclovir (ZOVIRAX) 400 MG tablet Take 1 tablet (400 mg total) by mouth daily. 30 tablet 5   FLUoxetine (PROZAC) 20 MG capsule Take 20 mg by mouth daily.     fluticasone (FLONASE) 50 MCG/ACT nasal spray SPRAY 1 SPRAY INTO BOTH NOSTRILS DAILY. 16 mL 2   folic acid (FOLVITE) 1 MG tablet Take 1 tablet (1 mg total) by mouth daily. 360 tablet 0   hydrocortisone (ANUSOL-HC) 25 MG suppository Unwrap and insert 1 suppository rectally twice a day 12 suppository 1   losartan (COZAAR) 100 MG tablet TAKE 1 TABLET BY MOUTH EVERYDAY AT BEDTIME (Patient taking differently: Take 100 mg by mouth daily. TAKE 1 TABLET BY MOUTH EVERYDAY AT BEDTIME) 90 tablet 3   Multiple Vitamin (MULTIVITAMIN WITH MINERALS) TABS tablet Take 1 tablet by mouth daily.     naltrexone (DEPADE) 50 MG tablet Take by mouth daily. Patient takes  one  50 mg tablet daily at the same time     pantoprazole (PROTONIX) 40 MG tablet Take 1 tablet (40 mg total) by mouth 2 (two) times daily. 180 tablet 3   potassium chloride SA (KLOR-CON M) 20 MEQ tablet Take 1 tablet (20 mEq total) by mouth daily.     rosuvastatin (CRESTOR) 5 MG tablet Take by mouth.     sodium chloride (OCEAN) 0.65 % SOLN nasal spray Place 2 sprays  into both nostrils as needed for congestion. 30 mL 2   torsemide (DEMADEX) 20 MG tablet Take 1 tablet (20 mg total) by mouth daily.     traZODone (DESYREL) 50 MG tablet TAKE 1 TABLET BY MOUTH EVERY DAY FOR SLEEP 90 tablet 4   triamterene-hydrochlorothiazide (MAXZIDE-25) 37.5-25 MG tablet Take 1 tablet by mouth daily.     No current facility-administered medications on file prior to visit.    Review of Systems  Constitutional:  Negative for chills and fever.  Respiratory:  Negative for cough.   Cardiovascular:  Negative for chest pain and palpitations.  Gastrointestinal:   Negative for nausea and vomiting.      Objective:    BP 118/76   Pulse (!) 57   Temp 98.1 F (36.7 C) (Oral)   Ht 5\' 1"  (1.549 m)   Wt 168 lb 12.8 oz (76.6 kg)   SpO2 99%   BMI 31.89 kg/m  BP Readings from Last 3 Encounters:  09/22/22 118/76  09/14/22 128/65  09/14/22 117/70   Wt Readings from Last 3 Encounters:  09/22/22 168 lb 12.8 oz (76.6 kg)  09/14/22 169 lb 6.4 oz (76.8 kg)  08/23/22 169 lb 6.4 oz (76.8 kg)    Physical Exam Vitals reviewed.  Constitutional:      Appearance: She is well-developed.  Eyes:     Conjunctiva/sclera: Conjunctivae normal.  Cardiovascular:     Rate and Rhythm: Normal rate and regular rhythm.     Pulses: Normal pulses.     Heart sounds: Normal heart sounds.  Pulmonary:     Effort: Pulmonary effort is normal.     Breath sounds: Normal breath sounds. No wheezing, rhonchi or rales.  Skin:    General: Skin is warm and dry.  Neurological:     Mental Status: She is alert.  Psychiatric:        Speech: Speech normal.        Behavior: Behavior normal.        Thought Content: Thought content normal.

## 2022-09-23 NOTE — Telephone Encounter (Signed)
noted 

## 2022-09-26 NOTE — Patient Instructions (Signed)
Please continue gabapentin 1000 mg in the morning and 1000 mg in the late afternoon.  Please let me know if you were to feel this regimen overall was too sedating for you.  Nice to see you!

## 2022-09-26 NOTE — Assessment & Plan Note (Signed)
Chronic, stable.  Continue gabapentin 1000 mg twice daily.  Patient has started physical therapy.  Will follow

## 2022-09-27 ENCOUNTER — Ambulatory Visit: Payer: Medicare Other

## 2022-09-29 ENCOUNTER — Ambulatory Visit: Payer: Medicare Other

## 2022-09-29 DIAGNOSIS — R262 Difficulty in walking, not elsewhere classified: Secondary | ICD-10-CM | POA: Diagnosis not present

## 2022-09-29 DIAGNOSIS — R2681 Unsteadiness on feet: Secondary | ICD-10-CM

## 2022-09-29 DIAGNOSIS — M5459 Other low back pain: Secondary | ICD-10-CM | POA: Diagnosis not present

## 2022-09-29 DIAGNOSIS — M6281 Muscle weakness (generalized): Secondary | ICD-10-CM | POA: Diagnosis not present

## 2022-09-29 NOTE — Therapy (Signed)
OUTPATIENT PHYSICAL THERAPY NEURO TREATMENT  Patient Name: Jennifer [name] MRN: [id number] DOB:[birth year], 72 y.o., female Today's Date: 09/29/2022   PCP: [name], FNP REFERRING PROVIDER: [name], FNP  END OF SESSION:  PT End of Session - 09/29/22 0830     Visit Number 4    Number of Visits 24    Date for PT Re-Evaluation 12/08/22    Progress Note Due on Visit 10    PT Start Time 0831    PT Stop Time 0914    PT Time Calculation (min) 43 min    Equipment Utilized During Treatment Gait belt    Activity Tolerance Patient tolerated treatment well    Behavior During Therapy WFL for tasks assessed/performed              Past Medical History:  Diagnosis Date   Anemia    Anxiety    Bariatric surgery status    Complication of anesthesia    Diificulty breathing for about 15 minutes after bariatric surgery   Constipation    COVID-19    Dysrhythmia    Elevated liver enzymes    GERD (gastroesophageal reflux disease)    Hemorrhoids    Herpes genitalis    High cholesterol    Hyperlipidemia    Hypertension    Hypothyroidism    IDA (iron deficiency anemia) 02/09/2021   Neuropathy    Osteoarthritis    Sleep apnea    CPAP   Past Surgical History:  Procedure Laterality Date   ABDOMINAL HYSTERECTOMY     total for fibroids no h/o abnormal pap   bariatric sleeve  2015   BREAST EXCISIONAL BIOPSY Left 1998   carpal tunnel repair     CATARACT EXTRACTION W/PHACO Right 11/01/2021   Procedure: CATARACT EXTRACTION PHACO AND INTRAOCULAR LENS PLACEMENT (IOC) RIGHT;  Surgeon: Nevada Crane, MD;  Location: [facility];  Service: Ophthalmology;  Laterality: Right;  sleep apnea 4.95 00:49.7   CATARACT EXTRACTION W/PHACO Left 11/15/2021   Procedure: CATARACT EXTRACTION PHACO AND INTRAOCULAR LENS PLACEMENT (IOC) LEFT 4.94 00:32.3;  Surgeon: Nevada Crane, MD;  Location: [facility];  Service: Ophthalmology;  Laterality: Left;  sleep apnea    COLONOSCOPY WITH PROPOFOL N/A 02/11/2016   Procedure: COLONOSCOPY WITH PROPOFOL;  Surgeon: Wyline Mood, MD;  Location: [facility] ENDOSCOPY;  Service: Endoscopy;  Laterality: N/A;   COLONOSCOPY WITH PROPOFOL N/A 10/01/2019   Procedure: COLONOSCOPY WITH PROPOFOL;  Surgeon: Wyline Mood, MD;  Location: [facility] ENDOSCOPY;  Service: Gastroenterology;  Laterality: N/A;   COLONOSCOPY WITH PROPOFOL N/A 11/19/2020   Procedure: COLONOSCOPY WITH PROPOFOL;  Surgeon: Wyline Mood, MD;  Location: [facility] ENDOSCOPY;  Service: Gastroenterology;  Laterality: N/A;   COLONOSCOPY WITH PROPOFOL N/A 05/05/2022   Procedure: COLONOSCOPY WITH PROPOFOL;  Surgeon: Regis Bill, MD;  Location: [facility] ENDOSCOPY;  Service: Endoscopy;  Laterality: N/A;   ESOPHAGOGASTRODUODENOSCOPY (EGD) WITH PROPOFOL N/A 12/02/2019   Procedure: ESOPHAGOGASTRODUODENOSCOPY (EGD) WITH PROPOFOL;  Surgeon: Wyline Mood, MD;  Location: [facility] ENDOSCOPY;  Service: Gastroenterology;  Laterality: N/A;   ESOPHAGOGASTRODUODENOSCOPY (EGD) WITH PROPOFOL N/A 11/19/2020   Procedure: ESOPHAGOGASTRODUODENOSCOPY (EGD) WITH PROPOFOL;  Surgeon: Wyline Mood, MD;  Location: [facility] ENDOSCOPY;  Service: Gastroenterology;  Laterality: N/A;   ESOPHAGOGASTRODUODENOSCOPY (EGD) WITH PROPOFOL N/A 05/05/2022   Procedure: ESOPHAGOGASTRODUODENOSCOPY (EGD) WITH PROPOFOL;  Surgeon: Regis Bill, MD;  Location: [facility] ENDOSCOPY;  Service: Endoscopy;  Laterality: N/A;   FLEXIBLE SIGMOIDOSCOPY N/A 02/06/2021   Procedure: FLEXIBLE SIGMOIDOSCOPY;  Surgeon: Jaynie Collins, DO;  Location: [facility] ENDOSCOPY;  Service: Gastroenterology;  Laterality: N/A;   GIVENS CAPSULE STUDY N/A 06/07/2021   Procedure: GIVENS CAPSULE STUDY;  Surgeon: Toney Reil, MD;  Location: [facility] ENDOSCOPY;  Service: Gastroenterology;  Laterality: N/A;   GIVENS CAPSULE STUDY N/A 06/09/2021   Procedure: GIVENS CAPSULE STUDY;  Surgeon: Toney Reil, MD;  Location: [facility] ENDOSCOPY;  Service: Gastroenterology;   Laterality: N/A;   HEMORRHOID SURGERY     LAPAROSCOPIC GASTRIC RESTRICTIVE DUODENAL PROCEDURE (DUODENAL SWITCH) Bilateral    2020   Patient Active Problem List   Diagnosis Date Noted   Friction blister 07/22/2022   Acute on chronic anemia 05/03/2022   Melena 05/03/2022   Alcohol use disorder 05/03/2022   Transaminitis 11/22/2021   Tinea pedis 08/31/2021   Upper GI bleed 06/06/2021   Eye discharge 02/16/2021   IDA (iron deficiency anemia) 02/09/2021   Acute blood loss anemia 02/06/2021   Elevated liver function tests    Bright red blood per rectum 02/05/2021   Open wound of left heel 01/20/2021   Anxiety and depression 11/25/2020   CPAP (continuous positive airway pressure) dependence 11/25/2020   Sinusitis 11/25/2020   Allergic conjunctivitis of both eyes and rhinitis 11/25/2020   GI bleed 11/17/2020   Chronic iron deficiency anemia    Symptomatic anemia 11/11/2020   Postop check 08/07/2020   Biliary sludge determined by ultrasound 07/02/2020   Right upper quadrant abdominal pain 06/10/2020   Right-sided thoracic back pain 05/18/2020   Muscle spasm 05/18/2020   Bilateral tinnitus 04/30/2020   Irregular heartbeat 02/25/2020   Diabetes mellitus without complication (HCC) 02/25/2020   Strain of right knee 02/10/2020   Aspirin long-term use 10/22/2019   Wound ballistics 08/29/2019   Displacement of lumbar intervertebral disc without myelopathy 08/29/2019   Helicobacter pylori gastrointestinal tract infection 08/29/2019   Hypercholesterolemia 08/29/2019   Phlebitis after infusion 06/26/2019   Superficial thrombophlebitis of left upper extremity 06/26/2019   BMI 38.0-38.9,adult 06/19/2019   Small bowel obstruction (HCC) 05/25/2019   Morbid obesity (HCC) 05/16/2019   Cervical pain (neck) 04/09/2019   Pedal edema 03/06/2019   Pre-operative clearance 03/06/2019   History of sleeve gastrectomy 01/23/2019   Chronic pain syndrome 10/03/2018   Obstructive sleep apnea 10/03/2018    Right shoulder pain 06/08/2018   Chronic venous insufficiency 05/24/2018   Lymphedema 05/24/2018   Lumbar spondylosis 05/17/2018   Leg swelling 05/09/2018   Neuropathy 01/24/2018   Insomnia 07/12/2017   Fracture of phalanx of finger 05/31/2017   Impingement syndrome of shoulder region 05/31/2017   Hemorrhoids 12/15/2016   Hypothyroidism 10/03/2016   Chronic shoulder bursitis 08/30/2016   Positive ANA (antinuclear antibody) 08/30/2016   Bradycardia 06/16/2016   History of adenomatous polyp of colon 05/02/2016   Elevated liver enzymes 04/06/2016   OSA (obstructive sleep apnea) 04/06/2016   Bilateral shoulder pain 01/13/2016   Fatty liver 12/08/2015   Arthritis 12/08/2015   History of bariatric surgery 12/07/2015   Genital herpes 11/11/2015   Hyperlipidemia 11/11/2015   Essential hypertension 11/11/2015   Routine physical examination 11/11/2015   Allergic rhinitis 11/11/2015   GERD (gastroesophageal reflux disease) 11/11/2015   Herpesviral infection, unspecified 01/08/2014   Disorder of thyroid, unspecified 01/08/2014   Lumbar radiculitis 11/19/2013   Neuritis or radiculitis due to rupture of lumbar intervertebral disc 11/19/2013   Monilial vaginitis 08/20/2013   Gastritis and duodenitis 07/25/2013   Impaired fasting glucose 07/17/2012   Obesity, unspecified 07/17/2012   Obesity 07/17/2012   Depression 03/15/2012   Abnormal electrocardiogram (ECG) (EKG) 03/06/2012  URI (upper respiratory infection) 01/16/2012    ONSET DATE: 1 and 1/2 years ago  REFERRING DIAG: G62.9 (ICD-10-CM) - Neuropathy   THERAPY DIAG:  Difficulty in walking, not elsewhere classified  Muscle weakness (generalized)  Unsteadiness on feet  Rationale for Evaluation and Treatment: Rehabilitation  SUBJECTIVE:                                                                                                                                                                                              SUBJECTIVE STATEMENT: Patient denies any pain today and states feeling more comfortable with use of cane.     Pt accompanied by: self  PERTINENT HISTORY: Saloni Hedgepeth is a 72 year old female with past medical History of iron deficiency anemia (receiving iron infusions and recent right heel ulcer (healed per chart) and extensive medical history as reported in above section. Paitent was seen by PCP and diagnosed with gait/balance impairments.   PAIN:  Are you having pain? Yes: NPRS scale: 6/10 Pain location: bilateral lower legs into feet Pain description: Burning, tingling, numbness, cold Aggravating factors: increased standing > 15 min- worse pain at night Relieving factors: Gabapentin/tylenol, heating pad  PRECAUTIONS: Fall  RED FLAGS: None   WEIGHT BEARING RESTRICTIONS: No  FALLS: Has patient fallen in last 6 months? No  LIVING ENVIRONMENT: Lives with: lives with their spouse Lives in: House/apartment Stairs: Yes: External: 3-4 steps; on left going up Has following equipment at home: Single point cane and Grab bars  PLOF: Independent  PATIENT GOALS: Improve my balance.   OBJECTIVE:   DIAGNOSTIC FINDINGS: None avaible in chart  COGNITION: Overall cognitive status: Within functional limits for tasks assessed   SENSATION: Light touch: Impaired - Bilateral LE  COORDINATION: Slow/delay reaction  EDEMA:  None observed   POSTURE: forward head  LOWER EXTREMITY ROM:      LOWER EXTREMITY MMT:    MMT Right Eval Left Eval  Hip flexion 4- 4-  Hip extension 4- 4-  Hip abduction 4- 4-  Hip adduction 4 4  Hip internal rotation 3+ 3+  Hip external rotation 3+ 3+  Knee flexion 4 4  Knee extension 4 4  Ankle dorsiflexion 4 4  Ankle plantarflexion    Ankle inversion    Ankle eversion    (Blank rows = not tested)  TRANSFERS: Assistive device utilized: None  Sit to stand: Complete Independence Stand to sit: Complete Independence Chair to chair:  Complete Independence Floor:  Not tested    GAIT: Gait pattern: decreased step length- Right, decreased step length- Left, and shuffling unsteady Distance walked: 120 feet Assistive device  utilized: None Level of assistance: CGA Comments: unsteady intermittent  FUNCTIONAL TESTS:  5 times sit to stand: 27.61 sec without UE support  Timed up and go (TUG): 18.33 sec 10 meter walk test: 0.66 m/s Berg Balance Scale: 42/56  PATIENT SURVEYS:  FOTO 40  TODAY'S TREATMENT:                                                                                                                              DATE:   Therex:    NMR:  -Dynamic step up onto purple pad - 5 reps each LE with UE support and 10 reps without UE support (unsteady initially yet much improved with practice)    -Standing on airex pad x 60 sec narrow foot base and EO x 30 sec- good balance with no significant sway so progressed to EC x 30 sec x 2-marked increased sway with 2 LOB- CGA recovery- posterior- did improve to only SBA on 2nd trial.  -Dynamic high knee march on airex pad (initially with light BUE touch then progressed to no UE support) - x 20 reps each LE- some imbalance with intermittent reaching for // bars.   -Tandem gait - down and back in // bars without UE support x 4 (light fingertip touch initially)   -Dynamic lateral step up/over red bolster with 3# AW  - Resistive gait in clinic x 300 feet with 3# AW- (mild unsteadiness with turning or turning head)   - gait with horizontal head turns - (increased stagggering and CGA for recovery on 2 episodes)   THEREX:  Sit to stand x 10 reps without UE Support   Unless otherwise stated, CGA was provided and gait belt donned in order to ensure pt safety   PATIENT EDUCATION: Education details: Purpose of PT/plan of care Person educated: Patient Education method: Explanation Education comprehension: verbalized understanding  HOME EXERCISE PROGRAM:  *Issued  RTB for home use Access Code: [email or web address] URL: [email or web address] Date: 09/20/2022 Prepared by: Maureen Ralphs  Exercises - Seated Hip Flexion March with Ankle Weights  - 1 x daily - 3 x weekly - 3 sets - 10 reps - Seated Long Arc Quad with Ankle Weight  - 1 x daily - 3 x weekly - 3 sets - 10 reps - Seated Hip Flexion and External Rotation  - 1 x daily - 3 x weekly - 3 sets - 10 reps - Seated Hip Internal Rotation with Resistance  - 1 x daily - 3 x weekly - 3 sets - 10 reps - Seated Hamstring Curl with Anchored Resistance  - 1 x daily - 3 x weekly - 3 sets - 10 reps - Sit to Stand with Arms Crossed  - 1 x daily - 3 x weekly - 3 sets - 10 reps  GOALS: Goals reviewed with patient? Yes  SHORT TERM GOALS: Target date: 10/27/2022  Pt will be independent with HEP in order to improve strength and balance in  order to decrease fall risk and improve function at home and work.  Baseline: EVAL- Patient has no formal HEP in place Goal status: INITIAL   LONG TERM GOALS: Target date: 12/08/2022   1.  Patient (> 31 years old) will complete five times sit to stand test in < 15 seconds indicating an increased LE strength and improved balance. Baseline: EVAL= 27.61 sec Goal status: INITIAL  2.  Patient will increase FOTO score to equal to or greater than  54   to demonstrate statistically significant improvement in mobility and quality of life.  Baseline: EVAL= 40 Goal status: INITIAL   3.  Patient will increase Berg Balance score by > 6 points to demonstrate decreased fall risk during functional activities. Baseline: EVAL: 42/56 Goal status: INITIAL   4.  Patient will reduce timed up and go to <13 seconds to reduce fall risk and demonstrate improved transfer/gait ability. Baseline: EVAL= 18.33 sec Goal status: INITIAL  5.  Patient will increase 10 meter walk test to >1.47m/s as to improve gait speed for better community ambulation and to reduce fall risk. Baseline: EVAL=  0.66 m/s Goal status: INITIAL ASSESSMENT:  CLINICAL IMPRESSION:  Patient presents with good motivation for today's session. She performed well today- initially unsteady with several dynamic balance activities on unlevel surfaces but adapted well and improved. She was most challenged at end of session with dynamic walking with head turning with significant imbalance and this will be focus for some future visits. Pt will continue to benefit from skilled physical therapy intervention to address impairments, improve QOL, and attain therapy goals.     OBJECTIVE IMPAIRMENTS: Abnormal gait, decreased activity tolerance, decreased balance, decreased coordination, decreased endurance, decreased mobility, difficulty walking, decreased strength, impaired sensation, and pain.   ACTIVITY LIMITATIONS: carrying, lifting, bending, standing, squatting, stairs, transfers, and bathing  PARTICIPATION LIMITATIONS: cleaning, laundry, shopping, community activity, and yard work  PERSONAL FACTORS: 3+ comorbidities: HTN, anemia, DM  are also affecting patient's functional outcome.   REHAB POTENTIAL: Good  CLINICAL DECISION MAKING: Stable/uncomplicated  EVALUATION COMPLEXITY: Moderate  PLAN:  PT FREQUENCY: 1-2x/week  PT DURATION: 12 weeks  PLANNED INTERVENTIONS: Therapeutic exercises, Therapeutic activity, Neuromuscular re-education, Balance training, Gait training, Patient/Family education, Self Care, Joint mobilization, Joint manipulation, Stair training, Vestibular training, Canalith repositioning, DME instructions, Dry Needling, Spinal manipulation, Spinal mobilization, Cryotherapy, Moist heat, Manual therapy, and Re-evaluation  PLAN FOR NEXT SESSION: Instruct in LE strengthening and balance and review/progress HEP.    Lenda Kelp, PT 09/29/2022, 9:18 AM

## 2022-09-30 DIAGNOSIS — Z03818 Encounter for observation for suspected exposure to other biological agents ruled out: Secondary | ICD-10-CM | POA: Diagnosis not present

## 2022-09-30 DIAGNOSIS — J019 Acute sinusitis, unspecified: Secondary | ICD-10-CM | POA: Diagnosis not present

## 2022-10-03 ENCOUNTER — Ambulatory Visit: Payer: Medicare Other

## 2022-10-05 ENCOUNTER — Encounter: Payer: Self-pay | Admitting: Primary Care

## 2022-10-05 ENCOUNTER — Ambulatory Visit (INDEPENDENT_AMBULATORY_CARE_PROVIDER_SITE_OTHER): Payer: Medicare Other | Admitting: Primary Care

## 2022-10-05 VITALS — BP 110/60 | HR 65 | Ht 61.0 in | Wt 171.8 lb

## 2022-10-05 DIAGNOSIS — G4733 Obstructive sleep apnea (adult) (pediatric): Secondary | ICD-10-CM | POA: Diagnosis not present

## 2022-10-05 NOTE — Progress Notes (Signed)
@Patient  ID: Jennifer [name], female    DOB: 03/02/1950, 72 y.o.   MRN: [id number]  Chief Complaint  Patient presents with   Consult    Referring provider: Allegra Grana, FNP  HPI: 72 year old female, former smoker quit 1993 (36-pack-year history).  Past medical history significant for OSA, allergic rhinitis, hypertension, GERD, diabetes mellitus, hypothyroidism, chronic pain syndrome.   Previous [facility] pulmonary encounter: 05/12/2021 Patient presents today ffor over OSA follow-up. She was last seen in 201 by pulmonary NP.  She has history of obstructive sleep apnea. Sleep study in January 2018 showed mild OSA, AHI 14/1/hr (REM supine sleep 55.5). She is currently on CPAP at 10cm h20. She is wearing her CPAP every night but only getting 2 hours of use. She reports experiencing condensation in her nasal mask with use. Humidity level is currently at 4. She feels she is also not getting enough pressure. No residual daytime sleepiness.   Airview download 04/10/2021 - 05/09/2021 Usage 30/30 days (100%); 3 days (10%) greater than 4 hours Average usage 2 hours 7 minutes Pressure 10 cm H2O Air leaks 9.9 L/min (95%) AHI 0.4   10/05/2022- Interim hx Patient presents today for overdue sleep follow-up. Hx mild sleep apnea, AHI 14/hour. It has been awhile since she has worn her CPAP. She reports getting too much moisture/condensation in her mask. She has tried adjusting humidification settings, currently set at level 4. Using nasal mask. CPAP machine is working. Feels pressure is not strong enough.  Airview download 09/04/22-10/03/22 Usage day 5/30 days (17%)  Average usage days used 1 hours 11 min Pressure 10cm h20 Airleaks 11L/min  AHI 0     Allergies  Allergen Reactions   Bee Pollen Itching   Celecoxib Hives   Hydrocodone Nausea Only   Pollen Extract Itching   Sodium Ferric Gluconate [Ferrous Gluconate]     IV ferric gluconate - shortly after the infusion developed back pain  shooting into left arm and bilateral hand swelling   Nasacort [Triamcinolone] Other (See Comments)    Nasal - Nose Bleeds   Vicodin [Hydrocodone-Acetaminophen] Nausea Only    Immunization History  Administered Date(s) Administered   COVID-19, mRNA, vaccine(Comirnaty)12 years and older 10/29/2021   Fluad Quad(high Dose 65+) 10/02/2020, 10/05/2021   Fluad Trivalent(High Dose 65+) 09/22/2022   Hep A / Hep B 05/24/2013, 11/21/2013   Influenza Split 10/21/2013   Influenza, High Dose Seasonal PF 10/24/2016, 09/06/2018   Influenza,inj,Quad PF,6+ Mos 11/08/2013, 10/28/2014   Influenza-Unspecified 12/05/2011, 10/28/2015, 10/24/2016, 09/06/2017, 10/18/2019   PFIZER(Purple Top)SARS-COV-2 Vaccination 03/15/2019, 04/09/2019, 04/09/2019, 11/01/2019, 08/07/2020   Pfizer Covid-19 Vaccine Bivalent Booster 21yrs & up 07/15/2021   Pneumococcal Conjugate-13 12/05/2014, 04/06/2016   Pneumococcal Polysaccharide-23 06/05/2017   Tdap 02/05/2014, 08/07/2018   Zoster Recombinant(Shingrix) 12/25/2018, 10/07/2021   Zoster, Live 01/09/2012, 12/05/2014    Past Medical History:  Diagnosis Date   Anemia    Anxiety    Bariatric surgery status    Complication of anesthesia    Diificulty breathing for about 15 minutes after bariatric surgery   Constipation    COVID-19    Dysrhythmia    Elevated liver enzymes    GERD (gastroesophageal reflux disease)    Hemorrhoids    Herpes genitalis    High cholesterol    Hyperlipidemia    Hypertension    Hypothyroidism    IDA (iron deficiency anemia) 02/09/2021   Neuropathy    Osteoarthritis    Sleep apnea    CPAP    Tobacco History:  Social History   Tobacco Use  Smoking Status Former   Current packs/day: 0.00   Average packs/day: 1.5 packs/day for 24.0 years (36.0 ttl pk-yrs)   Types: Cigarettes   Start date: 71   Quit date: 1993   Years since quitting: 31.7  Smokeless Tobacco Never  Tobacco Comments   quit 1995.    Counseling given: Not  Answered Tobacco comments: quit 1995.    Outpatient Medications Prior to Visit  Medication Sig Dispense Refill   acetaminophen (TYLENOL) 325 MG tablet Take 2 tablets (650 mg total) by mouth every 6 (six) hours as needed for mild pain (or Fever >/= 101).     acyclovir (ZOVIRAX) 400 MG tablet Take 1 tablet (400 mg total) by mouth daily. 30 tablet 5   FLUoxetine (PROZAC) 20 MG capsule Take 20 mg by mouth daily.     fluticasone (FLONASE) 50 MCG/ACT nasal spray SPRAY 1 SPRAY INTO BOTH NOSTRILS DAILY. 16 mL 2   folic acid (FOLVITE) 1 MG tablet Take 1 tablet (1 mg total) by mouth daily. 360 tablet 0   gabapentin (NEURONTIN) 100 MG capsule Take 100mg  with morning and evening dose gabapentin 900mg . 180 capsule 3   gabapentin (NEURONTIN) 300 MG capsule Take 3 capsules (900 mg total) by mouth 2 (two) times daily. 180 capsule 3   hydrocortisone (ANUSOL-HC) 25 MG suppository Unwrap and insert 1 suppository rectally twice a day 12 suppository 1   levothyroxine (SYNTHROID) 50 MCG tablet Take 1 tablet (50 mcg total) by mouth daily. 90 tablet 3   losartan (COZAAR) 100 MG tablet TAKE 1 TABLET BY MOUTH EVERYDAY AT BEDTIME (Patient taking differently: Take 100 mg by mouth daily. TAKE 1 TABLET BY MOUTH EVERYDAY AT BEDTIME) 90 tablet 3   Multiple Vitamin (MULTIVITAMIN WITH MINERALS) TABS tablet Take 1 tablet by mouth daily.     naltrexone (DEPADE) 50 MG tablet Take by mouth daily. Patient takes  one  50 mg tablet daily at the same time     pantoprazole (PROTONIX) 40 MG tablet Take 1 tablet (40 mg total) by mouth 2 (two) times daily. 180 tablet 3   potassium chloride SA (KLOR-CON M) 20 MEQ tablet Take 1 tablet (20 mEq total) by mouth daily.     rosuvastatin (CRESTOR) 5 MG tablet Take by mouth.     sodium chloride (OCEAN) 0.65 % SOLN nasal spray Place 2 sprays into both nostrils as needed for congestion. 30 mL 2   torsemide (DEMADEX) 20 MG tablet Take 1 tablet (20 mg total) by mouth daily.     traZODone (DESYREL) 50  MG tablet TAKE 1 TABLET BY MOUTH EVERY DAY FOR SLEEP 90 tablet 4   triamterene-hydrochlorothiazide (MAXZIDE-25) 37.5-25 MG tablet Take 1 tablet by mouth daily. (Patient not taking: Reported on 10/05/2022)     No facility-administered medications prior to visit.   Review of Systems  Review of Systems  Constitutional: Negative.   HENT: Negative.    Respiratory: Negative.    Cardiovascular: Negative.      Physical Exam  BP 110/60 (BP Location: Left Arm, Patient Position: Sitting, Cuff Size: Normal)   Pulse 65   Ht 5\' 1"  (1.549 m)   Wt 171 lb 12.8 oz (77.9 kg)   SpO2 99%   BMI 32.46 kg/m  Physical Exam Constitutional:      Appearance: Normal appearance.  HENT:     Head: Normocephalic and atraumatic.     Mouth/Throat:     Mouth: Mucous membranes are moist.  Pharynx: Oropharynx is clear.  Cardiovascular:     Rate and Rhythm: Normal rate and regular rhythm.  Pulmonary:     Effort: Pulmonary effort is normal.     Breath sounds: Normal breath sounds.  Skin:    General: Skin is warm and dry.  Neurological:     General: No focal deficit present.     Mental Status: She is alert. Mental status is at baseline.  Psychiatric:        Mood and Affect: Mood normal.        Behavior: Behavior normal.        Thought Content: Thought content normal.        Judgment: Judgment normal.      Lab Results:  CBC    Component Value Date/Time   WBC 4.7 09/07/2022 0914   WBC 5.2 07/06/2022 0845   RBC 3.91 09/07/2022 0914   RBC 3.86 (L) 09/07/2022 0913   HGB 10.5 (L) 09/07/2022 0914   HGB 9.9 (L) 06/24/2021 1330   HCT 32.6 (L) 09/07/2022 0914   HCT 31.9 (L) 06/24/2021 1330   PLT 170 09/07/2022 0914   PLT 332 06/24/2021 1330   MCV 83.4 09/07/2022 0914   MCV 83 06/24/2021 1330   MCV 84 06/26/2013 0409   MCH 26.9 09/07/2022 0914   MCHC 32.2 09/07/2022 0914   RDW 16.7 (H) 09/07/2022 0914   RDW 20.8 (H) 06/24/2021 1330   RDW 14.5 06/26/2013 0409   LYMPHSABS 1.3 09/07/2022 0914    LYMPHSABS 1.9 10/14/2020 1038   LYMPHSABS 1.5 06/26/2013 0409   MONOABS 0.6 09/07/2022 0914   MONOABS 0.7 06/26/2013 0409   EOSABS 0.1 09/07/2022 0914   EOSABS 0.0 10/14/2020 1038   EOSABS 0.0 06/26/2013 0409   BASOSABS 0.0 09/07/2022 0914   BASOSABS 0.1 10/14/2020 1038   BASOSABS 0.0 06/26/2013 0409    BMET    Component Value Date/Time   NA 133 (L) 09/07/2022 0914   NA 138 10/14/2020 1038   NA 139 06/26/2013 0409   K 4.0 09/07/2022 0914   K 4.3 06/26/2013 0409   CL 99 09/07/2022 0914   CL 106 06/26/2013 0409   CO2 26 09/07/2022 0914   CO2 28 06/26/2013 0409   GLUCOSE 102 (H) 09/07/2022 0914   GLUCOSE 108 (H) 06/26/2013 0409   BUN 18 09/07/2022 0914   BUN 11 10/14/2020 1038   BUN 12 06/26/2013 0409   CREATININE 1.15 (H) 09/07/2022 0914   CREATININE 0.72 06/26/2013 0409   CALCIUM 8.8 (L) 09/07/2022 0914   CALCIUM 8.5 06/26/2013 0409   GFRNONAA 50 (L) 09/07/2022 0914   GFRNONAA >60 06/26/2013 0409   GFRAA >60 05/24/2019 1420   GFRAA >60 06/26/2013 0409    BNP    Component Value Date/Time   BNP 5.3 11/17/2021 1104    ProBNP    Component Value Date/Time   PROBNP 10.0 05/07/2018 1351    Imaging: DG Foot Complete Right  Result Date: 09/09/2022 Please see detailed radiograph report in office note.    Assessment & Plan:   OSA (obstructive sleep apnea) - Hx mild-moderate OSA, AHI 14/hour. CPAP titrated to optimal pressure 10cm h20. She has been struggling with toleration due to mask condensation. We adjusted humidification level from level 4 to a level 1. We will also change pressure settings to auto settings 10-12cm h20. Encourage patient aim to wear CPAP nightly 4-6 hours or longer. FU in 4-8 weeks or sooner if needed.      Earnstine Regal  Clent Ridges, NP 10/05/2022

## 2022-10-05 NOTE — Progress Notes (Signed)
Reviewed and agree with assessment/plan.   Coralyn Helling, MD [facility] 10/05/2022, 12:42 PM Pager:  [phone]

## 2022-10-05 NOTE — Patient Instructions (Addendum)
We decreased humidity level from 4 to 1 (if too dry, increase humidity level to 2) Aim to wear CPAP nightly 4-6 hours or longer   Orders: Change auto settings 10-12cm h20  Follow-up: 4-8 weeks in [place] with Beth NP (or virtual is ok)

## 2022-10-05 NOTE — Therapy (Unsigned)
OUTPATIENT PHYSICAL THERAPY NEURO TREATMENT  Patient Name: Jennifer [name] MRN: [id number] DOB:01/05/1951, 72 y.o., female Today's Date: 10/06/2022   PCP: [name], FNP REFERRING PROVIDER: [name], FNP  END OF SESSION:  PT End of Session - 10/06/22 1526     Visit Number 5    Number of Visits 24    Date for PT Re-Evaluation 12/08/22    Progress Note Due on Visit 10    PT Start Time 1530    PT Stop Time 1613    PT Time Calculation (min) 43 min    Equipment Utilized During Treatment Gait belt    Activity Tolerance Patient tolerated treatment well    Behavior During Therapy WFL for tasks assessed/performed               Past Medical History:  Diagnosis Date   Anemia    Anxiety    Bariatric surgery status    Complication of anesthesia    Diificulty breathing for about 15 minutes after bariatric surgery   Constipation    COVID-19    Dysrhythmia    Elevated liver enzymes    GERD (gastroesophageal reflux disease)    Hemorrhoids    Herpes genitalis    High cholesterol    Hyperlipidemia    Hypertension    Hypothyroidism    IDA (iron deficiency anemia) 02/09/2021   Neuropathy    Osteoarthritis    Sleep apnea    CPAP   Past Surgical History:  Procedure Laterality Date   ABDOMINAL HYSTERECTOMY     total for fibroids no h/o abnormal pap   bariatric sleeve  2015   BREAST EXCISIONAL BIOPSY Left 1998   carpal tunnel repair     CATARACT EXTRACTION W/PHACO Right 11/01/2021   Procedure: CATARACT EXTRACTION PHACO AND INTRAOCULAR LENS PLACEMENT (IOC) RIGHT;  Surgeon: Nevada Crane, MD;  Location: [facility];  Service: Ophthalmology;  Laterality: Right;  sleep apnea 4.95 00:49.7   CATARACT EXTRACTION W/PHACO Left 11/15/2021   Procedure: CATARACT EXTRACTION PHACO AND INTRAOCULAR LENS PLACEMENT (IOC) LEFT 4.94 00:32.3;  Surgeon: Nevada Crane, MD;  Location: [facility];  Service: Ophthalmology;  Laterality: Left;  sleep apnea    COLONOSCOPY WITH PROPOFOL N/A 02/11/2016   Procedure: COLONOSCOPY WITH PROPOFOL;  Surgeon: Wyline Mood, MD;  Location: [facility] ENDOSCOPY;  Service: Endoscopy;  Laterality: N/A;   COLONOSCOPY WITH PROPOFOL N/A 10/01/2019   Procedure: COLONOSCOPY WITH PROPOFOL;  Surgeon: Wyline Mood, MD;  Location: [facility] ENDOSCOPY;  Service: Gastroenterology;  Laterality: N/A;   COLONOSCOPY WITH PROPOFOL N/A 11/19/2020   Procedure: COLONOSCOPY WITH PROPOFOL;  Surgeon: Wyline Mood, MD;  Location: [facility] ENDOSCOPY;  Service: Gastroenterology;  Laterality: N/A;   COLONOSCOPY WITH PROPOFOL N/A 05/05/2022   Procedure: COLONOSCOPY WITH PROPOFOL;  Surgeon: Regis Bill, MD;  Location: [facility] ENDOSCOPY;  Service: Endoscopy;  Laterality: N/A;   ESOPHAGOGASTRODUODENOSCOPY (EGD) WITH PROPOFOL N/A 12/02/2019   Procedure: ESOPHAGOGASTRODUODENOSCOPY (EGD) WITH PROPOFOL;  Surgeon: Wyline Mood, MD;  Location: [facility] ENDOSCOPY;  Service: Gastroenterology;  Laterality: N/A;   ESOPHAGOGASTRODUODENOSCOPY (EGD) WITH PROPOFOL N/A 11/19/2020   Procedure: ESOPHAGOGASTRODUODENOSCOPY (EGD) WITH PROPOFOL;  Surgeon: Wyline Mood, MD;  Location: [facility] ENDOSCOPY;  Service: Gastroenterology;  Laterality: N/A;   ESOPHAGOGASTRODUODENOSCOPY (EGD) WITH PROPOFOL N/A 05/05/2022   Procedure: ESOPHAGOGASTRODUODENOSCOPY (EGD) WITH PROPOFOL;  Surgeon: Regis Bill, MD;  Location: [facility] ENDOSCOPY;  Service: Endoscopy;  Laterality: N/A;   FLEXIBLE SIGMOIDOSCOPY N/A 02/06/2021   Procedure: FLEXIBLE SIGMOIDOSCOPY;  Surgeon: Jaynie Collins, DO;  Location: [facility]  END[facility]COPY;  Service: Gastroenterology;  Laterality: N/A;   GIVENS CAPSULE STUDY N/A 06/07/2021   Procedure: GIVENS CAPSULE STUDY;  Surgeon: Toney Reil, MD;  Location: [facility] ENDOSCOPY;  Service: Gastroenterology;  Laterality: N/A;   GIVENS CAPSULE STUDY N/A 06/09/2021   Procedure: GIVENS CAPSULE STUDY;  Surgeon: Toney Reil, MD;  Location: [facility] ENDOSCOPY;  Service: Gastroenterology;   Laterality: N/A;   HEMORRHOID SURGERY     LAPAROSCOPIC GASTRIC RESTRICTIVE DUODENAL PROCEDURE (DUODENAL SWITCH) Bilateral    2020   Patient Active Problem List   Diagnosis Date Noted   Friction blister 07/22/2022   Acute on chronic anemia 05/03/2022   Melena 05/03/2022   Alcohol use disorder 05/03/2022   Transaminitis 11/22/2021   Tinea pedis 08/31/2021   Upper GI bleed 06/06/2021   Eye discharge 02/16/2021   IDA (iron deficiency anemia) 02/09/2021   Acute blood loss anemia 02/06/2021   Elevated liver function tests    Bright red blood per rectum 02/05/2021   Open wound of left heel 01/20/2021   Anxiety and depression 11/25/2020   CPAP (continuous positive airway pressure) dependence 11/25/2020   Sinusitis 11/25/2020   Allergic conjunctivitis of both eyes and rhinitis 11/25/2020   GI bleed 11/17/2020   Chronic iron deficiency anemia    Symptomatic anemia 11/11/2020   Postop check 08/07/2020   Biliary sludge determined by ultrasound 07/02/2020   Right upper quadrant abdominal pain 06/10/2020   Right-sided thoracic back pain 05/18/2020   Muscle spasm 05/18/2020   Bilateral tinnitus 04/30/2020   Irregular heartbeat 02/25/2020   Diabetes mellitus without complication (HCC) 02/25/2020   Strain of right knee 02/10/2020   Aspirin long-term use 10/22/2019   Wound ballistics 08/29/2019   Displacement of lumbar intervertebral disc without myelopathy 08/29/2019   Helicobacter pylori gastrointestinal tract infection 08/29/2019   Hypercholesterolemia 08/29/2019   Phlebitis after infusion 06/26/2019   Superficial thrombophlebitis of left upper extremity 06/26/2019   BMI 38.0-38.9,adult 06/19/2019   Small bowel obstruction (HCC) 05/25/2019   Morbid obesity (HCC) 05/16/2019   Cervical pain (neck) 04/09/2019   Pedal edema 03/06/2019   Pre-operative clearance 03/06/2019   History of sleeve gastrectomy 01/23/2019   Chronic pain syndrome 10/03/2018   Obstructive sleep apnea 10/03/2018    Right shoulder pain 06/08/2018   Chronic venous insufficiency 05/24/2018   Lymphedema 05/24/2018   Lumbar spondylosis 05/17/2018   Leg swelling 05/09/2018   Neuropathy 01/24/2018   Insomnia 07/12/2017   Fracture of phalanx of finger 05/31/2017   Impingement syndrome of shoulder region 05/31/2017   Hemorrhoids 12/15/2016   Hypothyroidism 10/03/2016   Chronic shoulder bursitis 08/30/2016   Positive ANA (antinuclear antibody) 08/30/2016   Bradycardia 06/16/2016   History of adenomatous polyp of colon 05/02/2016   Elevated liver enzymes 04/06/2016   OSA (obstructive sleep apnea) 04/06/2016   Bilateral shoulder pain 01/13/2016   Fatty liver 12/08/2015   Arthritis 12/08/2015   History of bariatric surgery 12/07/2015   Genital herpes 11/11/2015   Hyperlipidemia 11/11/2015   Essential hypertension 11/11/2015   Routine physical examination 11/11/2015   Allergic rhinitis 11/11/2015   GERD (gastroesophageal reflux disease) 11/11/2015   Herpesviral infection, unspecified 01/08/2014   Disorder of thyroid, unspecified 01/08/2014   Lumbar radiculitis 11/19/2013   Neuritis or radiculitis due to rupture of lumbar intervertebral disc 11/19/2013   Monilial vaginitis 08/20/2013   Gastritis and duodenitis 07/25/2013   Impaired fasting glucose 07/17/2012   Obesity, unspecified 07/17/2012   Obesity 07/17/2012   Depression 03/15/2012   Abnormal electrocardiogram (ECG) (EKG)  03/06/2012   URI (upper respiratory infection) 01/16/2012    ONSET DATE: 1 and 1/2 years ago  REFERRING DIAG: G62.9 (ICD-10-CM) - Neuropathy   THERAPY DIAG:  Difficulty in walking, not elsewhere classified  Muscle weakness (generalized)  Unsteadiness on feet  Rationale for Evaluation and Treatment: Rehabilitation  SUBJECTIVE:                                                                                                                                                                                              SUBJECTIVE STATEMENT:  Pt reports feeling better from having a sinus infection earlier this week. No other changes since last session.     Pt accompanied by: self  PERTINENT HISTORY: Jennifer [name] is a 72 year old female with past medical History of iron deficiency anemia (receiving iron infusions and recent right heel ulcer (healed per chart) and extensive medical history as reported in above section. Paitent was seen by PCP and diagnosed with gait/balance impairments.   PAIN:  Are you having pain? Yes: NPRS scale: 6/10 Pain location: bilateral lower legs into feet Pain description: Burning, tingling, numbness, cold Aggravating factors: increased standing > 15 min- worse pain at night Relieving factors: Gabapentin/tylenol, heating pad  PRECAUTIONS: Fall  RED FLAGS: None   WEIGHT BEARING RESTRICTIONS: No  FALLS: Has patient fallen in last 6 months? No  LIVING ENVIRONMENT: Lives with: lives with their spouse Lives in: House/apartment Stairs: Yes: External: 3-4 steps; on left going up Has following equipment at home: Single point cane and Grab bars  PLOF: Independent  PATIENT GOALS: Improve my balance.   OBJECTIVE:   DIAGNOSTIC FINDINGS: None avaible in chart  COGNITION: Overall cognitive status: Within functional limits for tasks assessed   SENSATION: Light touch: Impaired - Bilateral LE  COORDINATION: Slow/delay reaction  EDEMA:  None observed   POSTURE: forward head  LOWER EXTREMITY ROM:      LOWER EXTREMITY MMT:    MMT Right Eval Left Eval  Hip flexion 4- 4-  Hip extension 4- 4-  Hip abduction 4- 4-  Hip adduction 4 4  Hip internal rotation 3+ 3+  Hip external rotation 3+ 3+  Knee flexion 4 4  Knee extension 4 4  Ankle dorsiflexion 4 4  Ankle plantarflexion    Ankle inversion    Ankle eversion    (Blank rows = not tested)  TRANSFERS: Assistive device utilized: None  Sit to stand: Complete Independence Stand to sit: Complete  Independence Chair to chair: Complete Independence Floor:  Not tested    GAIT: Gait pattern: decreased step length- Right, decreased step length- Left, and  shuffling unsteady Distance walked: 120 feet Assistive device utilized: None Level of assistance: CGA Comments: unsteady intermittent  FUNCTIONAL TESTS:  5 times sit to stand: 27.61 sec without UE support  Timed up and go (TUG): 18.33 sec 10 meter walk test: 0.66 m/s Berg Balance Scale: 42/56  PATIENT SURVEYS:  FOTO 40  TODAY'S TREATMENT:                                                                                                                              DATE:   NMR  Donned 2.5# AW - gait with horizontal head turns - x many bouts, a few LOB and noticeable decrease in gait speed,   Retro gait with cane x 15 ft with cane   - 180 degree turn then ambulation with increased speed back to treatment area   Doffed AW  SLS progression with one LE on airex and other on 6 in step x 2 rounds ea LE with lateral head turns  Adducted stance on airex with dual task balance game using white board   One LE on floor other on step with eyes closed 2 x 30 sec ea LE, intermittent UE support  THEREX:   Sit to stand x 10 reps without UE Support   Unless otherwise stated, CGA was provided and gait belt donned in order to ensure pt safety   PATIENT EDUCATION: Education details: Purpose of PT/plan of care Person educated: Patient Education method: Explanation Education comprehension: verbalized understanding  HOME EXERCISE PROGRAM:  *Issued RTB for home use Access Code: [email or web address] URL: [email or web address] Date: 09/20/2022 Prepared by: Maureen Ralphs  Exercises - Seated Hip Flexion March with Ankle Weights  - 1 x daily - 3 x weekly - 3 sets - 10 reps - Seated Long Arc Quad with Ankle Weight  - 1 x daily - 3 x weekly - 3 sets - 10 reps - Seated Hip Flexion and External Rotation  - 1 x daily - 3 x weekly -  3 sets - 10 reps - Seated Hip Internal Rotation with Resistance  - 1 x daily - 3 x weekly - 3 sets - 10 reps - Seated Hamstring Curl with Anchored Resistance  - 1 x daily - 3 x weekly - 3 sets - 10 reps - Sit to Stand with Arms Crossed  - 1 x daily - 3 x weekly - 3 sets - 10 reps  GOALS: Goals reviewed with patient? Yes  SHORT TERM GOALS: Target date: 10/27/2022  Pt will be independent with HEP in order to improve strength and balance in order to decrease fall risk and improve function at home and work.  Baseline: EVAL- Patient has no formal HEP in place Goal status: INITIAL   LONG TERM GOALS: Target date: 12/08/2022   1.  Patient (> 38 years old) will complete five times sit to stand test in < 15 seconds indicating an increased LE strength and improved balance. Baseline: EVAL=  27.61 sec Goal status: INITIAL  2.  Patient will increase FOTO score to equal to or greater than  54   to demonstrate statistically significant improvement in mobility and quality of life.  Baseline: EVAL= 40 Goal status: INITIAL   3.  Patient will increase Berg Balance score by > 6 points to demonstrate decreased fall risk during functional activities. Baseline: EVAL: 42/56 Goal status: INITIAL   4.  Patient will reduce timed up and go to <13 seconds to reduce fall risk and demonstrate improved transfer/gait ability. Baseline: EVAL= 18.33 sec Goal status: INITIAL  5.  Patient will increase 10 meter walk test to >1.5m/s as to improve gait speed for better community ambulation and to reduce fall risk. Baseline: EVAL= 0.66 m/s Goal status: INITIAL ASSESSMENT:  CLINICAL IMPRESSION:  Patient presents with good motivation for today's session. Pt continues to be challenged with activities where her visual input is altered or limited so these aspects of balance were challenged throughout session. Pt still has most difficulty with head turns compared to other movements. Pt showing good progress with strength  and balance interventions at this time. Pt will continue to benefit from skilled physical therapy intervention to address impairments, improve QOL, and attain therapy goals.      OBJECTIVE IMPAIRMENTS: Abnormal gait, decreased activity tolerance, decreased balance, decreased coordination, decreased endurance, decreased mobility, difficulty walking, decreased strength, impaired sensation, and pain.   ACTIVITY LIMITATIONS: carrying, lifting, bending, standing, squatting, stairs, transfers, and bathing  PARTICIPATION LIMITATIONS: cleaning, laundry, shopping, community activity, and yard work  PERSONAL FACTORS: 3+ comorbidities: HTN, anemia, DM  are also affecting patient's functional outcome.   REHAB POTENTIAL: Good  CLINICAL DECISION MAKING: Stable/uncomplicated  EVALUATION COMPLEXITY: Moderate  PLAN:  PT FREQUENCY: 1-2x/week  PT DURATION: 12 weeks  PLANNED INTERVENTIONS: Therapeutic exercises, Therapeutic activity, Neuromuscular re-education, Balance training, Gait training, Patient/Family education, Self Care, Joint mobilization, Joint manipulation, Stair training, Vestibular training, Canalith repositioning, DME instructions, Dry Needling, Spinal manipulation, Spinal mobilization, Cryotherapy, Moist heat, Manual therapy, and Re-evaluation  PLAN FOR NEXT SESSION: Instruct in LE strengthening and balance and review/progress HEP.    Norman Herrlich, PT 10/06/2022, 4:05 PM

## 2022-10-05 NOTE — Assessment & Plan Note (Addendum)
-   Hx mild-moderate OSA, AHI 14/hour. CPAP titrated to optimal pressure 10cm h20. She has been struggling with toleration due to mask condensation. We adjusted humidification level from level 4 to a level 1. We will also change pressure settings to auto settings 10-12cm h20. Encourage patient aim to wear CPAP nightly 4-6 hours or longer. FU in 4-8 weeks or sooner if needed.

## 2022-10-06 ENCOUNTER — Other Ambulatory Visit: Payer: Self-pay | Admitting: Gastroenterology

## 2022-10-06 ENCOUNTER — Ambulatory Visit: Payer: Medicare Other | Admitting: Physical Therapy

## 2022-10-06 DIAGNOSIS — R2681 Unsteadiness on feet: Secondary | ICD-10-CM

## 2022-10-06 DIAGNOSIS — R262 Difficulty in walking, not elsewhere classified: Secondary | ICD-10-CM | POA: Diagnosis not present

## 2022-10-06 DIAGNOSIS — M6281 Muscle weakness (generalized): Secondary | ICD-10-CM | POA: Diagnosis not present

## 2022-10-06 DIAGNOSIS — M5459 Other low back pain: Secondary | ICD-10-CM | POA: Diagnosis not present

## 2022-10-10 ENCOUNTER — Telehealth: Payer: Self-pay | Admitting: Family Medicine

## 2022-10-10 ENCOUNTER — Telehealth (INDEPENDENT_AMBULATORY_CARE_PROVIDER_SITE_OTHER): Payer: Medicare Other | Admitting: Family Medicine

## 2022-10-10 ENCOUNTER — Encounter: Payer: Self-pay | Admitting: Family Medicine

## 2022-10-10 ENCOUNTER — Telehealth: Payer: Self-pay

## 2022-10-10 VITALS — Temp 98.2°F | Ht 61.0 in | Wt 170.0 lb

## 2022-10-10 DIAGNOSIS — J329 Chronic sinusitis, unspecified: Secondary | ICD-10-CM | POA: Diagnosis not present

## 2022-10-10 MED ORDER — PREDNISONE 20 MG PO TABS
20.0000 mg | ORAL_TABLET | Freq: Every day | ORAL | 0 refills | Status: AC
Start: 2022-10-10 — End: 2022-10-15

## 2022-10-10 MED ORDER — BENZONATATE 100 MG PO CAPS
200.0000 mg | ORAL_CAPSULE | Freq: Three times a day (TID) | ORAL | 0 refills | Status: DC | PRN
Start: 2022-10-10 — End: 2022-11-25

## 2022-10-10 NOTE — Patient Instructions (Signed)
It was a pleasure meeting you today. Thank you for allowing me to take part in your health care.  Our goals for today as we discussed include:  Completed current antibiotics Start Prednisone 20 mg daily Take Cepacol lozenges or spray  Honey teas  Increase water intake  Continue with nasal sprays  Follow up with PCP as needed   If you have any questions or concerns, please do not hesitate to call the office at [phone].  I look forward to our next visit and until then take care and stay safe.  Regards,   Dana Allan, MD   [facility]

## 2022-10-10 NOTE — Telephone Encounter (Signed)
Error

## 2022-10-10 NOTE — Progress Notes (Addendum)
Virtual Visit via Telephone Note  Attempted Video visit but was unable to connect.   1635: Called patient at [phone].  No answer.  LVM 1637: Called patient at [phone]  I connected with Jennifer [name] on 10/18/22 at 1635 by telephone and verified that I am speaking with the correct person using two identifiers. Jennifer [name] is currently located at home and is currently with during this visit. The provider, Dana Allan, MD, is located in their office at time of visit.  I discussed the limitations, risks, security and privacy concerns of performing an evaluation and management service by telephone and the availability of in person appointments. I also discussed with the patient that there may be a patient responsible charge related to this service. The patient expressed understanding and agreed to proceed.  Subjective: PCP: Allegra Grana, FNP  Chief Complaint  Patient presents with   Sinusitis    X 2 weeks went to walk in clinic and medication not working   Walk in clinic few weeks ago prescribed doxycycline and cough medicine. Has 1 day of antibiotic left.  Seemed like clearing Phlegm increased and continues to feel tired.  Has some nasal congestion and mild sinus headaches. Tmax 98.2 CPAP pressure recently increased Denies any chest pain, SOB, wheezing. Covid test negative  ROS: Per HPI  Current Outpatient Medications:    acetaminophen (TYLENOL) 325 MG tablet, Take 2 tablets (650 mg total) by mouth every 6 (six) hours as needed for mild pain (or Fever >/= 101)., Disp: , Rfl:    acyclovir (ZOVIRAX) 400 MG tablet, Take 1 tablet (400 mg total) by mouth daily., Disp: 30 tablet, Rfl: 5   doxycycline (VIBRAMYCIN) 100 MG capsule, Take 100 mg by mouth 2 (two) times daily., Disp: , Rfl:    FLUoxetine (PROZAC) 20 MG capsule, Take 20 mg by mouth daily., Disp: , Rfl:    fluticasone (FLONASE) 50 MCG/ACT nasal spray, SPRAY 1 SPRAY INTO BOTH NOSTRILS DAILY., Disp: 16  mL, Rfl: 2   folic acid (FOLVITE) 1 MG tablet, Take 1 tablet (1 mg total) by mouth daily., Disp: 360 tablet, Rfl: 0   gabapentin (NEURONTIN) 100 MG capsule, Take 100mg  with morning and evening dose gabapentin 900mg ., Disp: 180 capsule, Rfl: 3   gabapentin (NEURONTIN) 300 MG capsule, Take 3 capsules (900 mg total) by mouth 2 (two) times daily., Disp: 180 capsule, Rfl: 3   hydrocortisone (ANUSOL-HC) 25 MG suppository, Unwrap and insert 1 suppository rectally twice a day, Disp: 12 suppository, Rfl: 1   levothyroxine (SYNTHROID) 50 MCG tablet, Take 1 tablet (50 mcg total) by mouth daily., Disp: 90 tablet, Rfl: 3   losartan (COZAAR) 100 MG tablet, TAKE 1 TABLET BY MOUTH EVERYDAY AT BEDTIME (Patient taking differently: Take 100 mg by mouth daily. TAKE 1 TABLET BY MOUTH EVERYDAY AT BEDTIME), Disp: 90 tablet, Rfl: 3   Multiple Vitamin (MULTIVITAMIN WITH MINERALS) TABS tablet, Take 1 tablet by mouth daily., Disp: , Rfl:    naltrexone (DEPADE) 50 MG tablet, Take by mouth daily. Patient takes  one  50 mg tablet daily at the same time, Disp: , Rfl:    pantoprazole (PROTONIX) 40 MG tablet, Take 1 tablet (40 mg total) by mouth 2 (two) times daily., Disp: 180 tablet, Rfl: 3   potassium chloride SA (KLOR-CON M) 20 MEQ tablet, Take 1 tablet (20 mEq total) by mouth daily., Disp: , Rfl:    rosuvastatin (CRESTOR) 5 MG tablet, Take by mouth., Disp: , Rfl:    sodium  chloride (OCEAN) 0.65 % SOLN nasal spray, Place 2 sprays into both nostrils as needed for congestion., Disp: 30 mL, Rfl: 2   torsemide (DEMADEX) 20 MG tablet, Take 1 tablet (20 mg total) by mouth daily., Disp: , Rfl:    triamterene-hydrochlorothiazide (MAXZIDE-25) 37.5-25 MG tablet, Take 1 tablet by mouth daily., Disp: , Rfl:    benzonatate (TESSALON) 100 MG capsule, Take 2 capsules (200 mg total) by mouth 3 (three) times daily as needed., Disp: 20 capsule, Rfl: 0   traZODone (DESYREL) 50 MG tablet, TAKE 1 TABLET BY MOUTH EVERY DAY FOR SLEEP, Disp: 90 tablet,  Rfl: 3  Observations/Objective: A&O  No respiratory distress or wheezing audible over the phone Mood, judgement, and thought processes all WNL  Assessment and Plan: Sinusitis, unspecified chronicity, unspecified location Assessment & Plan: Chronic Symptoms continued despite antibiotic treatment Start Prednisone 20 mg daily x 5 days Refill Tessalon Pearls for cough at night If no improvement follow up with PCP  Orders: -     predniSONE; Take 1 tablet (20 mg total) by mouth daily with breakfast for 5 days.  Dispense: 5 tablet; Refill: 0 -     Benzonatate; Take 2 capsules (200 mg total) by mouth 3 (three) times daily as needed.  Dispense: 20 capsule; Refill: 0    Follow Up Instructions: Return if symptoms worsen or fail to improve, for PCP.  I discussed the assessment and treatment plan with the patient. The patient was provided an opportunity to ask questions and all were answered. The patient agreed with the plan and demonstrated an understanding of the instructions.   The patient was advised to call back or seek an in-person evaluation if the symptoms worsen or if the condition fails to improve as anticipated.  The above assessment and management plan was discussed with the patient. The patient verbalized understanding of and has agreed to the management plan. Patient is aware to call the clinic if symptoms persist or worsen. Patient is aware when to return to the clinic for a follow-up visit. Patient educated on when it is appropriate to go to the emergency department.   Time call ended: 1655  I provided 25 minutes of non-face-to-face time during this encounter.  Dana Allan, MD

## 2022-10-11 DIAGNOSIS — F331 Major depressive disorder, recurrent, moderate: Secondary | ICD-10-CM | POA: Diagnosis not present

## 2022-10-12 ENCOUNTER — Ambulatory Visit: Payer: Medicare Other | Admitting: Physical Therapy

## 2022-10-12 DIAGNOSIS — R2681 Unsteadiness on feet: Secondary | ICD-10-CM | POA: Diagnosis not present

## 2022-10-12 DIAGNOSIS — M6281 Muscle weakness (generalized): Secondary | ICD-10-CM

## 2022-10-12 DIAGNOSIS — R262 Difficulty in walking, not elsewhere classified: Secondary | ICD-10-CM

## 2022-10-12 DIAGNOSIS — M5459 Other low back pain: Secondary | ICD-10-CM | POA: Diagnosis not present

## 2022-10-12 NOTE — Therapy (Signed)
OUTPATIENT PHYSICAL THERAPY NEURO TREATMENT  Patient Name: Jennifer [name] MRN: [id number] DOB:Nov 04, 1950, 72 y.o., female Today's Date: 10/12/2022   PCP: [name], FNP REFERRING PROVIDER: [name], FNP  END OF SESSION:  PT End of Session - 10/12/22 1320     Visit Number 6    Number of Visits 24    Date for PT Re-Evaluation 12/08/22    Progress Note Due on Visit 10    PT Start Time 1318    PT Stop Time 1359    PT Time Calculation (min) 41 min    Equipment Utilized During Treatment Gait belt    Activity Tolerance Patient tolerated treatment well    Behavior During Therapy WFL for tasks assessed/performed                Past Medical History:  Diagnosis Date   Anemia    Anxiety    Bariatric surgery status    Complication of anesthesia    Diificulty breathing for about 15 minutes after bariatric surgery   Constipation    COVID-19    Dysrhythmia    Elevated liver enzymes    GERD (gastroesophageal reflux disease)    Hemorrhoids    Herpes genitalis    High cholesterol    Hyperlipidemia    Hypertension    Hypothyroidism    IDA (iron deficiency anemia) 02/09/2021   Neuropathy    Osteoarthritis    Sleep apnea    CPAP   Past Surgical History:  Procedure Laterality Date   ABDOMINAL HYSTERECTOMY     total for fibroids no h/o abnormal pap   bariatric sleeve  2015   BREAST EXCISIONAL BIOPSY Left 1998   carpal tunnel repair     CATARACT EXTRACTION W/PHACO Right 11/01/2021   Procedure: CATARACT EXTRACTION PHACO AND INTRAOCULAR LENS PLACEMENT (IOC) RIGHT;  Surgeon: Nevada Crane, MD;  Location: [facility];  Service: Ophthalmology;  Laterality: Right;  sleep apnea 4.95 00:49.7   CATARACT EXTRACTION W/PHACO Left 11/15/2021   Procedure: CATARACT EXTRACTION PHACO AND INTRAOCULAR LENS PLACEMENT (IOC) LEFT 4.94 00:32.3;  Surgeon: Nevada Crane, MD;  Location: [facility];  Service: Ophthalmology;  Laterality: Left;  sleep apnea    COLONOSCOPY WITH PROPOFOL N/A 02/11/2016   Procedure: COLONOSCOPY WITH PROPOFOL;  Surgeon: Wyline Mood, MD;  Location: [facility] ENDOSCOPY;  Service: Endoscopy;  Laterality: N/A;   COLONOSCOPY WITH PROPOFOL N/A 10/01/2019   Procedure: COLONOSCOPY WITH PROPOFOL;  Surgeon: Wyline Mood, MD;  Location: [facility] ENDOSCOPY;  Service: Gastroenterology;  Laterality: N/A;   COLONOSCOPY WITH PROPOFOL N/A 11/19/2020   Procedure: COLONOSCOPY WITH PROPOFOL;  Surgeon: Wyline Mood, MD;  Location: [facility] ENDOSCOPY;  Service: Gastroenterology;  Laterality: N/A;   COLONOSCOPY WITH PROPOFOL N/A 05/05/2022   Procedure: COLONOSCOPY WITH PROPOFOL;  Surgeon: Regis Bill, MD;  Location: [facility] ENDOSCOPY;  Service: Endoscopy;  Laterality: N/A;   ESOPHAGOGASTRODUODENOSCOPY (EGD) WITH PROPOFOL N/A 12/02/2019   Procedure: ESOPHAGOGASTRODUODENOSCOPY (EGD) WITH PROPOFOL;  Surgeon: Wyline Mood, MD;  Location: [facility] ENDOSCOPY;  Service: Gastroenterology;  Laterality: N/A;   ESOPHAGOGASTRODUODENOSCOPY (EGD) WITH PROPOFOL N/A 11/19/2020   Procedure: ESOPHAGOGASTRODUODENOSCOPY (EGD) WITH PROPOFOL;  Surgeon: Wyline Mood, MD;  Location: [facility];  Service: Gastroenterology;  Laterality: N/A;   ESOPHAGOGASTRODUODENOSCOPY (EGD) WITH PROPOFOL N/A 05/05/2022   Procedure: ESOPHAGOGASTRODUODENOSCOPY (EGD) WITH PROPOFOL;  Surgeon: Regis Bill, MD;  Location: [facility] ENDOSCOPY;  Service: Endoscopy;  Laterality: N/A;   FLEXIBLE SIGMOIDOSCOPY N/A 02/06/2021   Procedure: FLEXIBLE SIGMOIDOSCOPY;  Surgeon: Jaynie Collins, DO;  Location:  [facility] END[facility]COPY;  Service: Gastroenterology;  Laterality: N/A;   GIVENS CAPSULE STUDY N/A 06/07/2021   Procedure: GIVENS CAPSULE STUDY;  Surgeon: Toney Reil, MD;  Location: [facility] ENDOSCOPY;  Service: Gastroenterology;  Laterality: N/A;   GIVENS CAPSULE STUDY N/A 06/09/2021   Procedure: GIVENS CAPSULE STUDY;  Surgeon: Toney Reil, MD;  Location: [facility] END[facility]COPY;  Service: Gastroenterology;   Laterality: N/A;   HEMORRHOID SURGERY     LAPAROSCOPIC GASTRIC RESTRICTIVE DUODENAL PROCEDURE (DUODENAL SWITCH) Bilateral    2020   Patient Active Problem List   Diagnosis Date Noted   Friction blister 07/22/2022   Acute on chronic anemia 05/03/2022   Melena 05/03/2022   Alcohol use disorder 05/03/2022   Transaminitis 11/22/2021   Tinea pedis 08/31/2021   Upper GI bleed 06/06/2021   Eye discharge 02/16/2021   IDA (iron deficiency anemia) 02/09/2021   Acute blood loss anemia 02/06/2021   Elevated liver function tests    Bright red blood per rectum 02/05/2021   Open wound of left heel 01/20/2021   Anxiety and depression 11/25/2020   CPAP (continuous positive airway pressure) dependence 11/25/2020   Sinusitis 11/25/2020   Allergic conjunctivitis of both eyes and rhinitis 11/25/2020   GI bleed 11/17/2020   Chronic iron deficiency anemia    Symptomatic anemia 11/11/2020   Postop check 08/07/2020   Biliary sludge determined by ultrasound 07/02/2020   Right upper quadrant abdominal pain 06/10/2020   Right-sided thoracic back pain 05/18/2020   Muscle spasm 05/18/2020   Bilateral tinnitus 04/30/2020   Irregular heartbeat 02/25/2020   Diabetes mellitus without complication (HCC) 02/25/2020   Strain of right knee 02/10/2020   Aspirin long-term use 10/22/2019   Wound ballistics 08/29/2019   Displacement of lumbar intervertebral disc without myelopathy 08/29/2019   Helicobacter pylori gastrointestinal tract infection 08/29/2019   Hypercholesterolemia 08/29/2019   Phlebitis after infusion 06/26/2019   Superficial thrombophlebitis of left upper extremity 06/26/2019   BMI 38.0-38.9,adult 06/19/2019   Small bowel obstruction (HCC) 05/25/2019   Morbid obesity (HCC) 05/16/2019   Cervical pain (neck) 04/09/2019   Pedal edema 03/06/2019   Pre-operative clearance 03/06/2019   History of sleeve gastrectomy 01/23/2019   Chronic pain syndrome 10/03/2018   Obstructive sleep apnea 10/03/2018    Right shoulder pain 06/08/2018   Chronic venous insufficiency 05/24/2018   Lymphedema 05/24/2018   Lumbar spondylosis 05/17/2018   Leg swelling 05/09/2018   Neuropathy 01/24/2018   Insomnia 07/12/2017   Fracture of phalanx of finger 05/31/2017   Impingement syndrome of shoulder region 05/31/2017   Hemorrhoids 12/15/2016   Hypothyroidism 10/03/2016   Chronic shoulder bursitis 08/30/2016   Positive ANA (antinuclear antibody) 08/30/2016   Bradycardia 06/16/2016   History of adenomatous polyp of colon 05/02/2016   Elevated liver enzymes 04/06/2016   OSA (obstructive sleep apnea) 04/06/2016   Bilateral shoulder pain 01/13/2016   Fatty liver 12/08/2015   Arthritis 12/08/2015   History of bariatric surgery 12/07/2015   Genital herpes 11/11/2015   Hyperlipidemia 11/11/2015   Essential hypertension 11/11/2015   Routine physical examination 11/11/2015   Allergic rhinitis 11/11/2015   GERD (gastroesophageal reflux disease) 11/11/2015   Herpesviral infection, unspecified 01/08/2014   Disorder of thyroid, unspecified 01/08/2014   Lumbar radiculitis 11/19/2013   Neuritis or radiculitis due to rupture of lumbar intervertebral disc 11/19/2013   Monilial vaginitis 08/20/2013   Gastritis and duodenitis 07/25/2013   Impaired fasting glucose 07/17/2012   Obesity, unspecified 07/17/2012   Obesity 07/17/2012   Depression 03/15/2012   Abnormal electrocardiogram (ECG) (  EKG) 03/06/2012   URI (upper respiratory infection) 01/16/2012    ONSET DATE: 1 and 1/2 years ago  REFERRING DIAG: G62.9 (ICD-10-CM) - Neuropathy   THERAPY DIAG:  Difficulty in walking, not elsewhere classified  Muscle weakness (generalized)  Unsteadiness on feet  Rationale for Evaluation and Treatment: Rehabilitation  SUBJECTIVE:                                                                                                                                                                                              SUBJECTIVE STATEMENT:  Pt reports doing well today. Pt denies any recent falls/stumbles since prior session. Pt denies any updates to medications or medical appointment since prior session. Pt reports good compliance with HEP when time permits.     Pt accompanied by: self  PERTINENT HISTORY: Audrene Metzel is a 72 year old female with past medical History of iron deficiency anemia (receiving iron infusions and recent right heel ulcer (healed per chart) and extensive medical history as reported in above section. Paitent was seen by PCP and diagnosed with gait/balance impairments.   PAIN:  Are you having pain? Yes: NPRS scale: 6/10 Pain location: bilateral lower legs into feet Pain description: Burning, tingling, numbness, cold Aggravating factors: increased standing > 15 min- worse pain at night Relieving factors: Gabapentin/tylenol, heating pad  PRECAUTIONS: Fall  RED FLAGS: None   WEIGHT BEARING RESTRICTIONS: No  FALLS: Has patient fallen in last 6 months? No  LIVING ENVIRONMENT: Lives with: lives with their spouse Lives in: House/apartment Stairs: Yes: External: 3-4 steps; on left going up Has following equipment at home: Single point cane and Grab bars  PLOF: Independent  PATIENT GOALS: Improve my balance.   OBJECTIVE:   DIAGNOSTIC FINDINGS: None avaible in chart  COGNITION: Overall cognitive status: Within functional limits for tasks assessed   SENSATION: Light touch: Impaired - Bilateral LE  COORDINATION: Slow/delay reaction  EDEMA:  None observed   POSTURE: forward head  LOWER EXTREMITY ROM:      LOWER EXTREMITY MMT:    MMT Right Eval Left Eval  Hip flexion 4- 4-  Hip extension 4- 4-  Hip abduction 4- 4-  Hip adduction 4 4  Hip internal rotation 3+ 3+  Hip external rotation 3+ 3+  Knee flexion 4 4  Knee extension 4 4  Ankle dorsiflexion 4 4  Ankle plantarflexion    Ankle inversion    Ankle eversion    (Blank rows = not  tested)  TRANSFERS: Assistive device utilized: None  Sit to stand: Complete Independence Stand to sit: Complete Independence Chair to chair: Complete Independence Floor:  Not tested    GAIT: Gait pattern: decreased step length- Right, decreased step length- Left, and shuffling unsteady Distance walked: 120 feet Assistive device utilized: None Level of assistance: CGA Comments: unsteady intermittent  FUNCTIONAL TESTS:  5 times sit to stand: 27.61 sec without UE support  Timed up and go (TUG): 18.33 sec 10 meter walk test: 0.66 m/s Berg Balance Scale: 42/56  PATIENT SURVEYS:  FOTO 40  TODAY'S TREATMENT:                                                                                                                              DATE:   NMR  Donned 3# AW - gait with intermittent retro gait x 5 min   2 x 10 marches with UE support  2 x 10 retro large steps with UE support and focus on step width 2 x 10 reps   Tandem stance 2 x 30 sec ea LE   Heel raises, x 10 no UE, difficulty maintaining balance in heel raised position   Stance on 20 degree incline x 30 sec normal, x 10 lateral and vertical head turns, x 30 sec eyes closed, 1 LOB initially.   One LE on floor other on step with eyes closed 2 x 30 sec ea LE, intermittent UE support  THEREX:   Sit to stand 2 x 10 reps without UE Support  Hip ER in seated position with GTB resistance 2 x 10 reps ea LE   Unless otherwise stated, CGA was provided and gait belt donned in order to ensure pt safety   PATIENT EDUCATION: Education details: Purpose of PT/plan of care Person educated: Patient Education method: Explanation Education comprehension: verbalized understanding  HOME EXERCISE PROGRAM:  *Issued RTB for home use Access Code: [email or web address] URL: [email or web address] Date: 09/20/2022 Prepared by: Maureen Ralphs  Exercises - Seated Hip Flexion March with Ankle Weights  - 1 x daily - 3 x weekly - 3  sets - 10 reps - Seated Long Arc Quad with Ankle Weight  - 1 x daily - 3 x weekly - 3 sets - 10 reps - Seated Hip Flexion and External Rotation  - 1 x daily - 3 x weekly - 3 sets - 10 reps - Seated Hip Internal Rotation with Resistance  - 1 x daily - 3 x weekly - 3 sets - 10 reps - Seated Hamstring Curl with Anchored Resistance  - 1 x daily - 3 x weekly - 3 sets - 10 reps - Sit to Stand with Arms Crossed  - 1 x daily - 3 x weekly - 3 sets - 10 reps  GOALS: Goals reviewed with patient? Yes  SHORT TERM GOALS: Target date: 10/27/2022  Pt will be independent with HEP in order to improve strength and balance in order to decrease fall risk and improve function at home and work.  Baseline: EVAL- Patient has no formal HEP in place Goal status: INITIAL   LONG  TERM GOALS: Target date: 12/08/2022   1.  Patient (> 74 years old) will complete five times sit to stand test in < 15 seconds indicating an increased LE strength and improved balance. Baseline: EVAL= 27.61 sec Goal status: INITIAL  2.  Patient will increase FOTO score to equal to or greater than  54   to demonstrate statistically significant improvement in mobility and quality of life.  Baseline: EVAL= 40 Goal status: INITIAL   3.  Patient will increase Berg Balance score by > 6 points to demonstrate decreased fall risk during functional activities. Baseline: EVAL: 42/56 Goal status: INITIAL   4.  Patient will reduce timed up and go to <13 seconds to reduce fall risk and demonstrate improved transfer/gait ability. Baseline: EVAL= 18.33 sec Goal status: INITIAL  5.  Patient will increase 10 meter walk test to >1.66m/s as to improve gait speed for better community ambulation and to reduce fall risk. Baseline: EVAL= 0.66 m/s Goal status: INITIAL ASSESSMENT:  CLINICAL IMPRESSION:  Patient presents with good motivation for today's session.  Continue to challenge visual and proprioceptive input would balance challenges this session.   Patient shows good progress with lower extremity strength as evidenced by her ability to perform sit to stands improving consistently.  Patient was challenged with heel raises without upper extremity support this date did show improvement with standing on incline board.  Pt will continue to benefit from skilled physical therapy intervention to address impairments, improve QOL, and attain therapy goals.      OBJECTIVE IMPAIRMENTS: Abnormal gait, decreased activity tolerance, decreased balance, decreased coordination, decreased endurance, decreased mobility, difficulty walking, decreased strength, impaired sensation, and pain.   ACTIVITY LIMITATIONS: carrying, lifting, bending, standing, squatting, stairs, transfers, and bathing  PARTICIPATION LIMITATIONS: cleaning, laundry, shopping, community activity, and yard work  PERSONAL FACTORS: 3+ comorbidities: HTN, anemia, DM  are also affecting patient's functional outcome.   REHAB POTENTIAL: Good  CLINICAL DECISION MAKING: Stable/uncomplicated  EVALUATION COMPLEXITY: Moderate  PLAN:  PT FREQUENCY: 1-2x/week  PT DURATION: 12 weeks  PLANNED INTERVENTIONS: Therapeutic exercises, Therapeutic activity, Neuromuscular re-education, Balance training, Gait training, Patient/Family education, Self Care, Joint mobilization, Joint manipulation, Stair training, Vestibular training, Canalith repositioning, DME instructions, Dry Needling, Spinal manipulation, Spinal mobilization, Cryotherapy, Moist heat, Manual therapy, and Re-evaluation  PLAN FOR NEXT SESSION: Instruct in LE strengthening and balance and review/progress HEP.    Norman Herrlich, PT 10/12/2022, 1:20 PM

## 2022-10-14 ENCOUNTER — Ambulatory Visit: Payer: Medicare Other

## 2022-10-14 DIAGNOSIS — R2681 Unsteadiness on feet: Secondary | ICD-10-CM | POA: Diagnosis not present

## 2022-10-14 DIAGNOSIS — M5459 Other low back pain: Secondary | ICD-10-CM | POA: Diagnosis not present

## 2022-10-14 DIAGNOSIS — M6281 Muscle weakness (generalized): Secondary | ICD-10-CM | POA: Diagnosis not present

## 2022-10-14 DIAGNOSIS — R262 Difficulty in walking, not elsewhere classified: Secondary | ICD-10-CM | POA: Diagnosis not present

## 2022-10-14 NOTE — Therapy (Signed)
OUTPATIENT PHYSICAL THERAPY NEURO TREATMENT  Patient Name: Jennifer [name] MRN: [id number] DOB:[birth year], 72 y.o., female Today's Date: 10/14/2022   PCP: [name], FNP REFERRING PROVIDER: [name], FNP  END OF SESSION:  PT End of Session - 10/14/22 0845     Visit Number 7    Number of Visits 24    Date for PT Re-Evaluation 12/08/22    Progress Note Due on Visit 10    PT Start Time 0845    PT Stop Time 0930    PT Time Calculation (min) 45 min    Equipment Utilized During Treatment Gait belt    Activity Tolerance Patient tolerated treatment well    Behavior During Therapy WFL for tasks assessed/performed                Past Medical History:  Diagnosis Date   Anemia    Anxiety    Bariatric surgery status    Complication of anesthesia    Diificulty breathing for about 15 minutes after bariatric surgery   Constipation    COVID-19    Dysrhythmia    Elevated liver enzymes    GERD (gastroesophageal reflux disease)    Hemorrhoids    Herpes genitalis    High cholesterol    Hyperlipidemia    Hypertension    Hypothyroidism    IDA (iron deficiency anemia) 02/09/2021   Neuropathy    Osteoarthritis    Sleep apnea    CPAP   Past Surgical History:  Procedure Laterality Date   ABDOMINAL HYSTERECTOMY     total for fibroids no h/o abnormal pap   bariatric sleeve  2015   BREAST EXCISIONAL BIOPSY Left 1998   carpal tunnel repair     CATARACT EXTRACTION W/PHACO Right 11/01/2021   Procedure: CATARACT EXTRACTION PHACO AND INTRAOCULAR LENS PLACEMENT (IOC) RIGHT;  Surgeon: Nevada Crane, MD;  Location: [facility];  Service: Ophthalmology;  Laterality: Right;  sleep apnea 4.95 00:49.7   CATARACT EXTRACTION W/PHACO Left 11/15/2021   Procedure: CATARACT EXTRACTION PHACO AND INTRAOCULAR LENS PLACEMENT (IOC) LEFT 4.94 00:32.3;  Surgeon: Nevada Crane, MD;  Location: [facility];  Service: Ophthalmology;  Laterality: Left;  sleep apnea    COLONOSCOPY WITH PROPOFOL N/A 02/11/2016   Procedure: COLONOSCOPY WITH PROPOFOL;  Surgeon: Wyline Mood, MD;  Location: [facility] ENDOSCOPY;  Service: Endoscopy;  Laterality: N/A;   COLONOSCOPY WITH PROPOFOL N/A 10/01/2019   Procedure: COLONOSCOPY WITH PROPOFOL;  Surgeon: Wyline Mood, MD;  Location: [facility] ENDOSCOPY;  Service: Gastroenterology;  Laterality: N/A;   COLONOSCOPY WITH PROPOFOL N/A 11/19/2020   Procedure: COLONOSCOPY WITH PROPOFOL;  Surgeon: Wyline Mood, MD;  Location: [facility] END[facility]COPY;  Service: Gastroenterology;  Laterality: N/A;   COLONOSCOPY WITH PROPOFOL N/A 05/05/2022   Procedure: COLONOSCOPY WITH PROPOFOL;  Surgeon: Regis Bill, MD;  Location: [facility] ENDOSCOPY;  Service: Endoscopy;  Laterality: N/A;   ESOPHAGOGASTRODUODENOSCOPY (EGD) WITH PROPOFOL N/A 12/02/2019   Procedure: ESOPHAGOGASTRODUODENOSCOPY (EGD) WITH PROPOFOL;  Surgeon: Wyline Mood, MD;  Location: [facility] ENDOSCOPY;  Service: Gastroenterology;  Laterality: N/A;   ESOPHAGOGASTRODUODENOSCOPY (EGD) WITH PROPOFOL N/A 11/19/2020   Procedure: ESOPHAGOGASTRODUODENOSCOPY (EGD) WITH PROPOFOL;  Surgeon: Wyline Mood, MD;  Location: [facility] ENDOSCOPY;  Service: Gastroenterology;  Laterality: N/A;   ESOPHAGOGASTRODUODENOSCOPY (EGD) WITH PROPOFOL N/A 05/05/2022   Procedure: ESOPHAGOGASTRODUODENOSCOPY (EGD) WITH PROPOFOL;  Surgeon: Regis Bill, MD;  Location: [facility] ENDOSCOPY;  Service: Endoscopy;  Laterality: N/A;   FLEXIBLE SIGMOIDOSCOPY N/A 02/06/2021   Procedure: FLEXIBLE SIGMOIDOSCOPY;  Surgeon: Jaynie Collins, DO;  Location:  [facility] ENDOSCOPY;  Service: Gastroenterology;  Laterality: N/A;   GIVENS CAPSULE STUDY N/A 06/07/2021   Procedure: GIVENS CAPSULE STUDY;  Surgeon: Toney Reil, MD;  Location: [facility] ENDOSCOPY;  Service: Gastroenterology;  Laterality: N/A;   GIVENS CAPSULE STUDY N/A 06/09/2021   Procedure: GIVENS CAPSULE STUDY;  Surgeon: Toney Reil, MD;  Location: [facility] ENDOSCOPY;  Service: Gastroenterology;   Laterality: N/A;   HEMORRHOID SURGERY     LAPAROSCOPIC GASTRIC RESTRICTIVE DUODENAL PROCEDURE (DUODENAL SWITCH) Bilateral    2020   Patient Active Problem List   Diagnosis Date Noted   Friction blister 07/22/2022   Acute on chronic anemia 05/03/2022   Melena 05/03/2022   Alcohol use disorder 05/03/2022   Transaminitis 11/22/2021   Tinea pedis 08/31/2021   Upper GI bleed 06/06/2021   Eye discharge 02/16/2021   IDA (iron deficiency anemia) 02/09/2021   Acute blood loss anemia 02/06/2021   Elevated liver function tests    Bright red blood per rectum 02/05/2021   Open wound of left heel 01/20/2021   Anxiety and depression 11/25/2020   CPAP (continuous positive airway pressure) dependence 11/25/2020   Sinusitis 11/25/2020   Allergic conjunctivitis of both eyes and rhinitis 11/25/2020   GI bleed 11/17/2020   Chronic iron deficiency anemia    Symptomatic anemia 11/11/2020   Postop check 08/07/2020   Biliary sludge determined by ultrasound 07/02/2020   Right upper quadrant abdominal pain 06/10/2020   Right-sided thoracic back pain 05/18/2020   Muscle spasm 05/18/2020   Bilateral tinnitus 04/30/2020   Irregular heartbeat 02/25/2020   Diabetes mellitus without complication (HCC) 02/25/2020   Strain of right knee 02/10/2020   Aspirin long-term use 10/22/2019   Wound ballistics 08/29/2019   Displacement of lumbar intervertebral disc without myelopathy 08/29/2019   Helicobacter pylori gastrointestinal tract infection 08/29/2019   Hypercholesterolemia 08/29/2019   Phlebitis after infusion 06/26/2019   Superficial thrombophlebitis of left upper extremity 06/26/2019   BMI 38.0-38.9,adult 06/19/2019   Small bowel obstruction (HCC) 05/25/2019   Morbid obesity (HCC) 05/16/2019   Cervical pain (neck) 04/09/2019   Pedal edema 03/06/2019   Pre-operative clearance 03/06/2019   History of sleeve gastrectomy 01/23/2019   Chronic pain syndrome 10/03/2018   Obstructive sleep apnea 10/03/2018    Right shoulder pain 06/08/2018   Chronic venous insufficiency 05/24/2018   Lymphedema 05/24/2018   Lumbar spondylosis 05/17/2018   Leg swelling 05/09/2018   Neuropathy 01/24/2018   Insomnia 07/12/2017   Fracture of phalanx of finger 05/31/2017   Impingement syndrome of shoulder region 05/31/2017   Hemorrhoids 12/15/2016   Hypothyroidism 10/03/2016   Chronic shoulder bursitis 08/30/2016   Positive ANA (antinuclear antibody) 08/30/2016   Bradycardia 06/16/2016   History of adenomatous polyp of colon 05/02/2016   Elevated liver enzymes 04/06/2016   OSA (obstructive sleep apnea) 04/06/2016   Bilateral shoulder pain 01/13/2016   Fatty liver 12/08/2015   Arthritis 12/08/2015   History of bariatric surgery 12/07/2015   Genital herpes 11/11/2015   Hyperlipidemia 11/11/2015   Essential hypertension 11/11/2015   Routine physical examination 11/11/2015   Allergic rhinitis 11/11/2015   GERD (gastroesophageal reflux disease) 11/11/2015   Herpesviral infection, unspecified 01/08/2014   Disorder of thyroid, unspecified 01/08/2014   Lumbar radiculitis 11/19/2013   Neuritis or radiculitis due to rupture of lumbar intervertebral disc 11/19/2013   Monilial vaginitis 08/20/2013   Gastritis and duodenitis 07/25/2013   Impaired fasting glucose 07/17/2012   Obesity, unspecified 07/17/2012   Obesity 07/17/2012   Depression 03/15/2012   Abnormal electrocardiogram (ECG) (  EKG) 03/06/2012   URI (upper respiratory infection) 01/16/2012    ONSET DATE: 1 and 1/2 years ago  REFERRING DIAG: G62.9 (ICD-10-CM) - Neuropathy   THERAPY DIAG:  Difficulty in walking, not elsewhere classified  Muscle weakness (generalized)  Unsteadiness on feet  Other low back pain  Rationale for Evaluation and Treatment: Rehabilitation  SUBJECTIVE:                                                                                                                                                                                              SUBJECTIVE STATEMENT:  Pt reports feeling okay today- does endorse some neuropathy in feet. Denies any falls.     Pt accompanied by: self  PERTINENT HISTORY: Mitzel Mirante is a 72 year old female with past medical History of iron deficiency anemia (receiving iron infusions and recent right heel ulcer (healed per chart) and extensive medical history as reported in above section. Paitent was seen by PCP and diagnosed with gait/balance impairments.   PAIN:  Are you having pain? Yes: NPRS scale: 4/10 Pain location: bilateral lower legs into feet Pain description: Burning, tingling, numbness, cold Aggravating factors: increased standing > 15 min- worse pain at night Relieving factors: Gabapentin/tylenol, heating pad  PRECAUTIONS: Fall  RED FLAGS: None   WEIGHT BEARING RESTRICTIONS: No  FALLS: Has patient fallen in last 6 months? No  LIVING ENVIRONMENT: Lives with: lives with their spouse Lives in: House/apartment Stairs: Yes: External: 3-4 steps; on left going up Has following equipment at home: Single point cane and Grab bars  PLOF: Independent  PATIENT GOALS: Improve my balance.   OBJECTIVE:   DIAGNOSTIC FINDINGS: None avaible in chart  COGNITION: Overall cognitive status: Within functional limits for tasks assessed   SENSATION: Light touch: Impaired - Bilateral LE  COORDINATION: Slow/delay reaction  EDEMA:  None observed   POSTURE: forward head  LOWER EXTREMITY ROM:      LOWER EXTREMITY MMT:    MMT Right Eval Left Eval  Hip flexion 4- 4-  Hip extension 4- 4-  Hip abduction 4- 4-  Hip adduction 4 4  Hip internal rotation 3+ 3+  Hip external rotation 3+ 3+  Knee flexion 4 4  Knee extension 4 4  Ankle dorsiflexion 4 4  Ankle plantarflexion    Ankle inversion    Ankle eversion    (Blank rows = not tested)  TRANSFERS: Assistive device utilized: None  Sit to stand: Complete Independence Stand to sit: Complete  Independence Chair to chair: Complete Independence Floor:  Not tested    GAIT: Gait pattern: decreased step length- Right, decreased step length-  Left, and shuffling unsteady Distance walked: 120 feet Assistive device utilized: None Level of assistance: CGA Comments: unsteady intermittent  FUNCTIONAL TESTS:  5 times sit to stand: 27.61 sec without UE support  Timed up and go (TUG): 18.33 sec 10 meter walk test: 0.66 m/s Berg Balance Scale: 42/56  PATIENT SURVEYS:  FOTO 40  TODAY'S TREATMENT:                                                                                                                              DATE:   NMR: in // bars with CGA and gait belt Donned 3# AW - High knee march/walk down and back without UE support x 5  -side step walk in // bars - down and back without UE support x 3 with 3#  --side step walk in // bars - down and back without UE support x 1 with 3# and RTB around ankles   Forward/ retro large steps without  UE support - down and back x 10.   Tandem stance 2 x 30 sec ea LE   Tandem gait - forwward in // bars- down and back x 4   THEREX:  Piriformis seated stretch -hold 30 sec on right LE x 3 Fig. 4 stretch (seated) RLE- hold 30 sec x 3    Unless otherwise stated, CGA was provided and gait belt donned in order to ensure pt safety   PATIENT EDUCATION: Education details: Purpose of PT/plan of care Person educated: Patient Education method: Explanation Education comprehension: verbalized understanding  HOME EXERCISE PROGRAM:  *Issued RTB for home use Access Code: [email or web address] URL: [email or web address] Date: 09/20/2022 Prepared by: Maureen Ralphs  Exercises - Seated Hip Flexion March with Ankle Weights  - 1 x daily - 3 x weekly - 3 sets - 10 reps - Seated Long Arc Quad with Ankle Weight  - 1 x daily - 3 x weekly - 3 sets - 10 reps - Seated Hip Flexion and External Rotation  - 1 x daily - 3 x weekly - 3 sets -  10 reps - Seated Hip Internal Rotation with Resistance  - 1 x daily - 3 x weekly - 3 sets - 10 reps - Seated Hamstring Curl with Anchored Resistance  - 1 x daily - 3 x weekly - 3 sets - 10 reps - Sit to Stand with Arms Crossed  - 1 x daily - 3 x weekly - 3 sets - 10 reps  GOALS: Goals reviewed with patient? Yes  SHORT TERM GOALS: Target date: 10/27/2022  Pt will be independent with HEP in order to improve strength and balance in order to decrease fall risk and improve function at home and work.  Baseline: EVAL- Patient has no formal HEP in place Goal status: INITIAL   LONG TERM GOALS: Target date: 12/08/2022   1.  Patient (> 32 years old) will complete five times sit to stand test in < 15 seconds indicating an  increased LE strength and improved balance. Baseline: EVAL= 27.61 sec Goal status: INITIAL  2.  Patient will increase FOTO score to equal to or greater than  54   to demonstrate statistically significant improvement in mobility and quality of life.  Baseline: EVAL= 40 Goal status: INITIAL   3.  Patient will increase Berg Balance score by > 6 points to demonstrate decreased fall risk during functional activities. Baseline: EVAL: 42/56 Goal status: INITIAL   4.  Patient will reduce timed up and go to <13 seconds to reduce fall risk and demonstrate improved transfer/gait ability. Baseline: EVAL= 18.33 sec Goal status: INITIAL  5.  Patient will increase 10 meter walk test to >1.52m/s as to improve gait speed for better community ambulation and to reduce fall risk. Baseline: EVAL= 0.66 m/s Goal status: INITIAL ASSESSMENT:  CLINICAL IMPRESSION:  Patient performed well with some in session progress with tandem standing/walking- less unsteadiness with practice. She responded well to right hip stretching and going to practice at home for some relief of hip soreness. No barriers to progress- she is receptive to [facility] for correct technique with exercises.  Pt will continue to benefit  from skilled physical therapy intervention to address impairments, improve QOL, and attain therapy goals.      OBJECTIVE IMPAIRMENTS: Abnormal gait, decreased activity tolerance, decreased balance, decreased coordination, decreased endurance, decreased mobility, difficulty walking, decreased strength, impaired sensation, and pain.   ACTIVITY LIMITATIONS: carrying, lifting, bending, standing, squatting, stairs, transfers, and bathing  PARTICIPATION LIMITATIONS: cleaning, laundry, shopping, community activity, and yard work  PERSONAL FACTORS: 3+ comorbidities: HTN, anemia, DM  are also affecting patient's functional outcome.   REHAB POTENTIAL: Good  CLINICAL DECISION MAKING: Stable/uncomplicated  EVALUATION COMPLEXITY: Moderate  PLAN:  PT FREQUENCY: 1-2x/week  PT DURATION: 12 weeks  PLANNED INTERVENTIONS: Therapeutic exercises, Therapeutic activity, Neuromuscular re-education, Balance training, Gait training, Patient/Family education, Self Care, Joint mobilization, Joint manipulation, Stair training, Vestibular training, Canalith repositioning, DME instructions, Dry Needling, Spinal manipulation, Spinal mobilization, Cryotherapy, Moist heat, Manual therapy, and Re-evaluation  PLAN FOR NEXT SESSION: Instruct in LE strengthening and balance and review/progress HEP.    Lenda Kelp, PT 10/14/2022, 12:01 PM

## 2022-10-15 ENCOUNTER — Other Ambulatory Visit: Payer: Self-pay | Admitting: Family

## 2022-10-15 DIAGNOSIS — F32A Depression, unspecified: Secondary | ICD-10-CM

## 2022-10-18 ENCOUNTER — Telehealth: Payer: Self-pay | Admitting: Oncology

## 2022-10-18 ENCOUNTER — Other Ambulatory Visit: Payer: Self-pay

## 2022-10-18 ENCOUNTER — Encounter: Payer: Self-pay | Admitting: Family Medicine

## 2022-10-18 DIAGNOSIS — K219 Gastro-esophageal reflux disease without esophagitis: Secondary | ICD-10-CM | POA: Diagnosis not present

## 2022-10-18 DIAGNOSIS — D62 Acute posthemorrhagic anemia: Secondary | ICD-10-CM

## 2022-10-18 DIAGNOSIS — H6063 Unspecified chronic otitis externa, bilateral: Secondary | ICD-10-CM | POA: Diagnosis not present

## 2022-10-18 DIAGNOSIS — J0120 Acute ethmoidal sinusitis, unspecified: Secondary | ICD-10-CM | POA: Diagnosis not present

## 2022-10-18 DIAGNOSIS — J301 Allergic rhinitis due to pollen: Secondary | ICD-10-CM | POA: Diagnosis not present

## 2022-10-18 NOTE — Assessment & Plan Note (Addendum)
Chronic Symptoms continued despite antibiotic treatment Start Prednisone 20 mg daily x 5 days Refill Tessalon Pearls for cough at night If no improvement follow up with PCP

## 2022-10-18 NOTE — Telephone Encounter (Signed)
Patient called to request sooner appointment- states she is feeling tired and may need more infusions. Please advise scheduling

## 2022-10-19 ENCOUNTER — Ambulatory Visit: Payer: Medicare Other

## 2022-10-19 ENCOUNTER — Telehealth: Payer: Self-pay | Admitting: Family

## 2022-10-19 DIAGNOSIS — H0289 Other specified disorders of eyelid: Secondary | ICD-10-CM | POA: Diagnosis not present

## 2022-10-19 NOTE — Addendum Note (Signed)
Addended by: Enid Cutter on: 10/19/2022 05:14 PM   Modules accepted: Level of Service

## 2022-10-19 NOTE — Telephone Encounter (Signed)
Pt would like a referral for the nephrology in her hands and feet

## 2022-10-19 NOTE — Telephone Encounter (Signed)
er

## 2022-10-24 ENCOUNTER — Ambulatory Visit: Payer: Medicare Other

## 2022-10-24 NOTE — Telephone Encounter (Signed)
Call patient I am a bit confused  Nephrology was mentioned in the phone note however I suspect patient is referring to right low back pain, numbness.    She previously been seen by Dr. Levi Aland at [facility].  I aan see in the notes after MRI lumbar spine, Whitney Meeler from Dr Yves Dill office recommended consult with dr Myer Haff, neurosurgery.  Does she mean neurosurgery consult?   She also has h/o carpal tunnel after emg study from [facility] 12/30/21.    Please clarify and I will place consult.     My notes:  03/14/22 Whitney Meeler Please make patient aware that MRI of the lumbar spine without contrast revealed severe facet degenerative changes and mild central stenosis at L4-5. Previously she was referred to Dr. Marcell Barlow she wished to discuss surgical options I think it is reasonable to discuss options with him.

## 2022-10-25 ENCOUNTER — Ambulatory Visit: Payer: Medicare Other | Admitting: Physical Therapy

## 2022-10-27 ENCOUNTER — Ambulatory Visit: Payer: Medicare Other

## 2022-10-27 ENCOUNTER — Inpatient Hospital Stay: Payer: Medicare Other | Attending: Oncology

## 2022-10-27 DIAGNOSIS — D508 Other iron deficiency anemias: Secondary | ICD-10-CM | POA: Insufficient documentation

## 2022-10-27 DIAGNOSIS — D62 Acute posthemorrhagic anemia: Secondary | ICD-10-CM

## 2022-10-27 LAB — CBC WITH DIFFERENTIAL (CANCER CENTER ONLY)
Abs Immature Granulocytes: 0.01 10*3/uL (ref 0.00–0.07)
Basophils Absolute: 0 10*3/uL (ref 0.0–0.1)
Basophils Relative: 0 %
Eosinophils Absolute: 0.1 10*3/uL (ref 0.0–0.5)
Eosinophils Relative: 2 %
HCT: 35.6 % — ABNORMAL LOW (ref 36.0–46.0)
Hemoglobin: 11.2 g/dL — ABNORMAL LOW (ref 12.0–15.0)
Immature Granulocytes: 0 %
Lymphocytes Relative: 33 %
Lymphs Abs: 1.7 10*3/uL (ref 0.7–4.0)
MCH: 27.1 pg (ref 26.0–34.0)
MCHC: 31.5 g/dL (ref 30.0–36.0)
MCV: 86.2 fL (ref 80.0–100.0)
Monocytes Absolute: 0.4 10*3/uL (ref 0.1–1.0)
Monocytes Relative: 8 %
Neutro Abs: 3 10*3/uL (ref 1.7–7.7)
Neutrophils Relative %: 57 %
Platelet Count: 140 10*3/uL — ABNORMAL LOW (ref 150–400)
RBC: 4.13 MIL/uL (ref 3.87–5.11)
RDW: 15.2 % (ref 11.5–15.5)
WBC Count: 5.3 10*3/uL (ref 4.0–10.5)
nRBC: 0 % (ref 0.0–0.2)

## 2022-10-27 LAB — IRON AND TIBC
Iron: 82 ug/dL (ref 28–170)
Saturation Ratios: 24 % (ref 10.4–31.8)
TIBC: 344 ug/dL (ref 250–450)
UIBC: 262 ug/dL

## 2022-10-27 LAB — VITAMIN B12: Vitamin B-12: 368 pg/mL (ref 180–914)

## 2022-10-27 LAB — SAMPLE TO BLOOD BANK

## 2022-10-27 LAB — FERRITIN: Ferritin: 102 ng/mL (ref 11–307)

## 2022-10-28 NOTE — Telephone Encounter (Signed)
Unable to reach patient. Left voicemail to return call to our office.   

## 2022-10-31 ENCOUNTER — Other Ambulatory Visit: Payer: Self-pay | Admitting: Family

## 2022-11-01 ENCOUNTER — Ambulatory Visit: Payer: Medicare Other | Admitting: Physical Therapy

## 2022-11-01 ENCOUNTER — Ambulatory Visit: Payer: Medicare Other

## 2022-11-01 ENCOUNTER — Ambulatory Visit (INDEPENDENT_AMBULATORY_CARE_PROVIDER_SITE_OTHER): Payer: Medicare Other | Admitting: Nurse Practitioner

## 2022-11-01 VITALS — BP 137/73 | HR 56 | Resp 17 | Ht 61.0 in | Wt 177.8 lb

## 2022-11-01 DIAGNOSIS — I10 Essential (primary) hypertension: Secondary | ICD-10-CM | POA: Diagnosis not present

## 2022-11-01 DIAGNOSIS — I890 Lymphedema, not elsewhere classified: Secondary | ICD-10-CM

## 2022-11-01 NOTE — Telephone Encounter (Signed)
Spoke to pt and she meant a NEUROLOGY appt not Nephrology, she stated that she has appt with vein & vascular today and foot and ankle tomorrow. She is going to keep appt with Ortho as well. She will call after appts and make appt to see you to discuss

## 2022-11-02 ENCOUNTER — Ambulatory Visit (INDEPENDENT_AMBULATORY_CARE_PROVIDER_SITE_OTHER): Payer: Medicare Other | Admitting: Podiatry

## 2022-11-02 ENCOUNTER — Encounter: Payer: Self-pay | Admitting: Podiatry

## 2022-11-02 ENCOUNTER — Telehealth: Payer: Self-pay | Admitting: Family

## 2022-11-02 DIAGNOSIS — E1142 Type 2 diabetes mellitus with diabetic polyneuropathy: Secondary | ICD-10-CM | POA: Diagnosis not present

## 2022-11-02 DIAGNOSIS — M2170 Unequal limb length (acquired), unspecified site: Secondary | ICD-10-CM | POA: Diagnosis not present

## 2022-11-02 DIAGNOSIS — R600 Localized edema: Secondary | ICD-10-CM

## 2022-11-02 NOTE — Telephone Encounter (Signed)
Patient states went to pharmacy to pick up meds and she was given triamterene-hydrochlorothiazide (MAXZIDE-25) 37.5-25 MG tablet and says she has not been taking this medication for a while now. She would like to know if she is suppose to be taking it or not? Please advise

## 2022-11-02 NOTE — Progress Notes (Signed)
Subjective:  Patient ID: Jennifer [name], female    DOB: December 28, 1950,  MRN: [id number]  Chief Complaint  Patient presents with   Diabetes    "I'm taking physical therapy because I loose my balance.  The side of my heel is swollen.  Can he give me something different than Gabapentin for the numbness and tingling."    72 y.o. female presents with the above complaint. History confirmed with patient.  Ulceration is well-healed.  She notes some swelling as well  Objective:  Physical Exam: warm, good capillary refill, no trophic changes or ulcerative lesions, strong palpable DP and nonpalpable PT pulses, and there are no open ulcerations.  She does have peripheral edema bilaterally right worse than left.        Assessment:   1. Diabetic polyneuropathy associated with type 2 diabetes mellitus (HCC)   2. Acquired unequal limb length   3. Peripheral edema      Plan:  Patient was evaluated and treated and all questions answered.  Ulcer remains healed and can continue to use moisturizing lotion or Aquaphor  For her balance problems I discussed with her that her gabapentin likely could be contributing this as well.  She will discuss with her PCP alternative therapies.  I discussed with her that topical lidocaine may offer some benefit but this would be temporary.  She says that she cannot go without the gabapentin due to the pain that she has in her feet.  She does have unequal limb length that may be contributing this as well.  Her right leg is approximately 1 cm shorter than the left leg, I dispensed felt heel lifts for her and she will try to use these to see if this is helpful, I discussed with her that from a shoe gear standpoint that I would recommend utilizing supportive shoe gear such as a sneaker with a wide base of support as opposed to any flats or heels.   Return if symptoms worsen or fail to improve.

## 2022-11-02 NOTE — Telephone Encounter (Signed)
LVM to call back.

## 2022-11-02 NOTE — Telephone Encounter (Signed)
Okay  Please let pt know that I am holding on neurology appointment until her follow up   Agree with seeing podiatry and orthopedics

## 2022-11-03 ENCOUNTER — Ambulatory Visit: Payer: Medicare Other

## 2022-11-03 NOTE — Telephone Encounter (Signed)
Spoke with pt on 11/02/22 and informed her of note below. Pt verbalized understanding

## 2022-11-07 ENCOUNTER — Encounter (INDEPENDENT_AMBULATORY_CARE_PROVIDER_SITE_OTHER): Payer: Self-pay | Admitting: Nurse Practitioner

## 2022-11-07 NOTE — Progress Notes (Signed)
Subjective:    Patient ID: Jennifer [name], female    DOB: 03-26-1950, 72 y.o.   MRN: [id number] Chief Complaint  Patient presents with   Venous Insufficiency    HPI  Review of Systems  Cardiovascular:  Positive for leg swelling.  Skin:  Negative for wound.  All other systems reviewed and are negative.      Objective:   Physical Exam Vitals reviewed.  HENT:     Head: Normocephalic.  Cardiovascular:     Rate and Rhythm: Normal rate.     Pulses: Normal pulses.  Pulmonary:     Effort: Pulmonary effort is normal.  Musculoskeletal:     Right lower leg: No edema.     Left lower leg: No edema.  Skin:    General: Skin is warm and dry.  Neurological:     Mental Status: She is alert and oriented to person, place, and time.  Psychiatric:        Mood and Affect: Mood normal.        Behavior: Behavior normal.        Thought Content: Thought content normal.        Judgment: Judgment normal.     BP 137/73 (BP Location: Left Arm)   Pulse (!) 56   Resp 17   Ht 5\' 1"  (1.549 m)   Wt 177 lb 12.8 oz (80.6 kg)   BMI 33.60 kg/m   Past Medical History:  Diagnosis Date   Anemia    Anxiety    Bariatric surgery status    Complication of anesthesia    Diificulty breathing for about 15 minutes after bariatric surgery   Constipation    COVID-19    Dysrhythmia    Elevated liver enzymes    GERD (gastroesophageal reflux disease)    Hemorrhoids    Herpes genitalis    High cholesterol    Hyperlipidemia    Hypertension    Hypothyroidism    IDA (iron deficiency anemia) 02/09/2021   Neuropathy    Osteoarthritis    Sleep apnea    CPAP    Social History   Socioeconomic History   Marital status: Married    Spouse name: Not on file   Number of children: Not on file   Years of education: Not on file   Highest education level: Some college, no degree  Occupational History   Not on file  Tobacco Use   Smoking status: Former    Current packs/day: 0.00    Average  packs/day: 1.5 packs/day for 24.0 years (36.0 ttl pk-yrs)    Types: Cigarettes    Start date: 33    Quit date: 19    Years since quitting: 31.8   Smokeless tobacco: Never   Tobacco comments:    quit 1995.   Vaping Use   Vaping status: Never Used  Substance and Sexual Activity   Alcohol use: Not Currently    Alcohol/week: 14.0 standard drinks of alcohol    Types: 14 Glasses of wine per week   Drug use: No   Sexual activity: Not Currently    Birth control/protection: Surgical    Comment: Hysterectomy  Other Topics Concern   Not on file  Social History Narrative   Lives in [place].    Married.    Retired 2015, Engineer, structural.    One son; granddaughter.    Left handed    Caffeine- decaf coffee.    Social Determinants of Health   Financial Resource Strain:  Low Risk  (05/17/2022)   Overall Financial Resource Strain (CARDIA)    Difficulty of Paying Living Expenses: Not hard at all  Food Insecurity: No Food Insecurity (05/17/2022)   Hunger Vital Sign    Worried About Running Out of Food in the Last Year: Never true    Ran Out of Food in the Last Year: Never true  Transportation Needs: No Transportation Needs (05/17/2022)   PRAPARE - Administrator, Civil Service (Medical): No    Lack of Transportation (Non-Medical): No  Physical Activity: Unknown (05/17/2022)   Exercise Vital Sign    Days of Exercise per Week: 0 days    Minutes of Exercise per Session: Not on file  Stress: Stress Concern Present (05/17/2022)   [employer] of Occupational Health - Occupational Stress Questionnaire    Feeling of Stress : To some extent  Social Connections: Unknown (05/17/2022)   Social Connection and Isolation Panel [NHANES]    Frequency of Communication with Friends and Family: Twice a week    Frequency of Social Gatherings with Friends and Family: Patient declined    Attends Religious Services: Patient declined    Database administrator or Organizations: Patient  declined    Attends Banker Meetings: Not on file    Marital Status: Married  Intimate Partner Violence: Not At Risk (05/04/2022)   Humiliation, Afraid, Rape, and Kick questionnaire    Fear of Current or Ex-Partner: No    Emotionally Abused: No    Physically Abused: No    Sexually Abused: No    Past Surgical History:  Procedure Laterality Date   ABDOMINAL HYSTERECTOMY     total for fibroids no h/o abnormal pap   bariatric sleeve  2015   BREAST EXCISIONAL BIOPSY Left 1998   carpal tunnel repair     CATARACT EXTRACTION W/PHACO Right 11/01/2021   Procedure: CATARACT EXTRACTION PHACO AND INTRAOCULAR LENS PLACEMENT (IOC) RIGHT;  Surgeon: Nevada Crane, MD;  Location: [facility];  Service: Ophthalmology;  Laterality: Right;  sleep apnea 4.95 00:49.7   CATARACT EXTRACTION W/PHACO Left 11/15/2021   Procedure: CATARACT EXTRACTION PHACO AND INTRAOCULAR LENS PLACEMENT (IOC) LEFT 4.94 00:32.3;  Surgeon: Nevada Crane, MD;  Location: [facility];  Service: Ophthalmology;  Laterality: Left;  sleep apnea   COLONOSCOPY WITH PROPOFOL N/A 02/11/2016   Procedure: COLONOSCOPY WITH PROPOFOL;  Surgeon: Wyline Mood, MD;  Location: [facility];  Service: Endoscopy;  Laterality: N/A;   COLONOSCOPY WITH PROPOFOL N/A 10/01/2019   Procedure: COLONOSCOPY WITH PROPOFOL;  Surgeon: Wyline Mood, MD;  Location: [facility] END[facility]COPY;  Service: Gastroenterology;  Laterality: N/A;   COLONOSCOPY WITH PROPOFOL N/A 11/19/2020   Procedure: COLONOSCOPY WITH PROPOFOL;  Surgeon: Wyline Mood, MD;  Location: [facility];  Service: Gastroenterology;  Laterality: N/A;   COLONOSCOPY WITH PROPOFOL N/A 05/05/2022   Procedure: COLONOSCOPY WITH PROPOFOL;  Surgeon: Regis Bill, MD;  Location: [facility];  Service: Endoscopy;  Laterality: N/A;   ESOPHAGOGASTRODUODENOSCOPY (EGD) WITH PROPOFOL N/A 12/02/2019   Procedure: ESOPHAGOGASTRODUODENOSCOPY (EGD) WITH PROPOFOL;  Surgeon: Wyline Mood,  MD;  Location: [facility];  Service: Gastroenterology;  Laterality: N/A;   ESOPHAGOGASTRODUODENOSCOPY (EGD) WITH PROPOFOL N/A 11/19/2020   Procedure: ESOPHAGOGASTRODUODENOSCOPY (EGD) WITH PROPOFOL;  Surgeon: Wyline Mood, MD;  Location: [facility];  Service: Gastroenterology;  Laterality: N/A;   ESOPHAGOGASTRODUODENOSCOPY (EGD) WITH PROPOFOL N/A 05/05/2022   Procedure: ESOPHAGOGASTRODUODENOSCOPY (EGD) WITH PROPOFOL;  Surgeon: Regis Bill, MD;  Location: [facility];  Service: Endoscopy;  Laterality: N/A;  FLEXIBLE SIGMOIDOSCOPY N/A 02/06/2021   Procedure: FLEXIBLE SIGMOIDOSCOPY;  Surgeon: Jaynie Collins, DO;  Location: [facility] END[facility]COPY;  Service: Gastroenterology;  Laterality: N/A;   GIVENS CAPSULE STUDY N/A 06/07/2021   Procedure: GIVENS CAPSULE STUDY;  Surgeon: Toney Reil, MD;  Location: [facility] ENDOSCOPY;  Service: Gastroenterology;  Laterality: N/A;   GIVENS CAPSULE STUDY N/A 06/09/2021   Procedure: GIVENS CAPSULE STUDY;  Surgeon: Toney Reil, MD;  Location: [facility] ENDOSCOPY;  Service: Gastroenterology;  Laterality: N/A;   HEMORRHOID SURGERY     LAPAROSCOPIC GASTRIC RESTRICTIVE DUODENAL PROCEDURE (DUODENAL SWITCH) Bilateral    2020    Family History  Problem Relation Age of Onset   Hypertension Mother    Heart disease Father    Alcohol abuse Father    Breast cancer Sister 18       materal 1/2 sister   Hypertension Sister    Hypertension Brother     Allergies  Allergen Reactions   Bee Pollen Itching   Celecoxib Hives   Hydrocodone Nausea Only   Pollen Extract Itching   Sodium Ferric Gluconate [Ferrous Gluconate]     IV ferric gluconate - shortly after the infusion developed back pain shooting into left arm and bilateral hand swelling   Nasacort [Triamcinolone] Other (See Comments)    Nasal - Nose Bleeds   Vicodin [Hydrocodone-Acetaminophen] Nausea Only       Latest Ref Rng & Units 10/27/2022    8:25 AM 09/07/2022    9:14 AM 07/06/2022     8:45 AM  CBC  WBC 4.0 - 10.5 K/uL 5.3  4.7  5.2   Hemoglobin 12.0 - 15.0 g/dL 13.0  86.5  8.6   Hematocrit 36.0 - 46.0 % 35.6  32.6  26.2   Platelets 150 - 400 K/uL 140  170  163       CMP     Component Value Date/Time   NA 133 (L) 09/07/2022 0914   NA 138 10/14/2020 1038   NA 139 06/26/2013 0409   K 4.0 09/07/2022 0914   K 4.3 06/26/2013 0409   CL 99 09/07/2022 0914   CL 106 06/26/2013 0409   CO2 26 09/07/2022 0914   CO2 28 06/26/2013 0409   GLUCOSE 102 (H) 09/07/2022 0914   GLUCOSE 108 (H) 06/26/2013 0409   BUN 18 09/07/2022 0914   BUN 11 10/14/2020 1038   BUN 12 06/26/2013 0409   CREATININE 1.15 (H) 09/07/2022 0914   CREATININE 0.72 06/26/2013 0409   CALCIUM 8.8 (L) 09/07/2022 0914   CALCIUM 8.5 06/26/2013 0409   PROT 6.9 09/07/2022 0914   PROT 7.2 10/14/2020 1038   ALBUMIN 3.6 09/07/2022 0914   ALBUMIN 4.9 (H) 10/14/2020 1038   ALBUMIN 3.6 06/26/2013 0409   AST 42 (H) 09/07/2022 0914   ALT 19 09/07/2022 0914   ALKPHOS 102 09/07/2022 0914   BILITOT 0.4 09/07/2022 0914   GFR 54.77 (L) 08/17/2022 1120   EGFR 72 10/14/2020 1038   GFRNONAA 50 (L) 09/07/2022 0914   GFRNONAA >60 06/26/2013 0409     No results found.     Assessment & Plan:   1. Lymphedema She currently does not appear to have any open ulcerations bilaterally.  Also not a red spots either.  She does have some swelling noted bilaterally.  We have we discussed conservative therapies including compression elevation activity.  In addition the patient is advised to regularly use her lymphedema pump.  Will have her return in a month to see how her  progression is going with conservative therapy.  2. Essential hypertension Continue antihypertensive medications as already ordered, these medications have been reviewed and there are no changes at this time.   Current Outpatient Medications on File Prior to Visit  Medication Sig Dispense Refill   acetaminophen (TYLENOL) 325 MG tablet Take 2 tablets (650 mg  total) by mouth every 6 (six) hours as needed for mild pain (or Fever >/= 101).     acyclovir (ZOVIRAX) 400 MG tablet Take 1 tablet (400 mg total) by mouth daily. 30 tablet 5   FLUoxetine (PROZAC) 20 MG capsule Take 20 mg by mouth daily.     fluticasone (FLONASE) 50 MCG/ACT nasal spray INSTILL 1 SPRAY INTO BOTH NOSTRILS DAILY 48 mL 0   folic acid (FOLVITE) 1 MG tablet Take 1 tablet (1 mg total) by mouth daily. 360 tablet 0   gabapentin (NEURONTIN) 100 MG capsule Take 100mg  with morning and evening dose gabapentin 900mg . 180 capsule 3   hydrocortisone (ANUSOL-HC) 25 MG suppository Unwrap and insert 1 suppository rectally twice a day 12 suppository 1   levothyroxine (SYNTHROID) 50 MCG tablet Take 1 tablet (50 mcg total) by mouth daily. 90 tablet 3   losartan (COZAAR) 100 MG tablet TAKE 1 TABLET BY MOUTH EVERYDAY AT BEDTIME (Patient taking differently: Take 100 mg by mouth daily. TAKE 1 TABLET BY MOUTH EVERYDAY AT BEDTIME) 90 tablet 3   Multiple Vitamin (MULTIVITAMIN WITH MINERALS) TABS tablet Take 1 tablet by mouth daily.     naltrexone (DEPADE) 50 MG tablet Take by mouth daily. Patient takes  one  50 mg tablet daily at the same time     omeprazole (PRILOSEC) 40 MG capsule Take 40 mg by mouth daily.     potassium chloride SA (KLOR-CON M) 20 MEQ tablet Take 1 tablet (20 mEq total) by mouth daily.     rosuvastatin (CRESTOR) 5 MG tablet Take by mouth.     sodium chloride (OCEAN) 0.65 % SOLN nasal spray Place 2 sprays into both nostrils as needed for congestion. 30 mL 2   torsemide (DEMADEX) 20 MG tablet Take 1 tablet (20 mg total) by mouth daily.     traZODone (DESYREL) 50 MG tablet TAKE 1 TABLET BY MOUTH EVERY DAY FOR SLEEP 90 tablet 3   triamterene-hydrochlorothiazide (MAXZIDE-25) 37.5-25 MG tablet Take 1 tablet by mouth daily.     benzonatate (TESSALON) 100 MG capsule Take 2 capsules (200 mg total) by mouth 3 (three) times daily as needed. (Patient not taking: Reported on 11/01/2022) 20 capsule 0    doxycycline (VIBRAMYCIN) 100 MG capsule Take 100 mg by mouth 2 (two) times daily. (Patient not taking: Reported on 11/01/2022)     gabapentin (NEURONTIN) 300 MG capsule Take 3 capsules (900 mg total) by mouth 2 (two) times daily. 180 capsule 3   pantoprazole (PROTONIX) 40 MG tablet Take 1 tablet (40 mg total) by mouth 2 (two) times daily. (Patient not taking: Reported on 11/01/2022) 180 tablet 3   No current facility-administered medications on file prior to visit.    There are no Patient Instructions on file for this visit. No follow-ups on file.   Georgiana Spinner, NP

## 2022-11-07 NOTE — Telephone Encounter (Signed)
LMTCB & sent a mychart message

## 2022-11-08 ENCOUNTER — Ambulatory Visit: Payer: Medicare Other | Admitting: Physical Therapy

## 2022-11-08 DIAGNOSIS — F331 Major depressive disorder, recurrent, moderate: Secondary | ICD-10-CM | POA: Diagnosis not present

## 2022-11-08 NOTE — Therapy (Signed)
OUTPATIENT PHYSICAL THERAPY NEURO TREATMENT  Patient Name: Jennifer [name] MRN: [id number] DOB:10/14/1950, 72 y.o., female Today's Date: 11/09/2022   PCP: [name], FNP REFERRING PROVIDER: [name], FNP  END OF SESSION:  PT End of Session - 11/09/22 1400     Visit Number 8    Number of Visits 24    Date for PT Re-Evaluation 12/08/22    Progress Note Due on Visit 10    PT Start Time 1400    PT Stop Time 1444    PT Time Calculation (min) 44 min    Equipment Utilized During Treatment Gait belt    Activity Tolerance Patient tolerated treatment well    Behavior During Therapy WFL for tasks assessed/performed                 Past Medical History:  Diagnosis Date   Anemia    Anxiety    Bariatric surgery status    Complication of anesthesia    Diificulty breathing for about 15 minutes after bariatric surgery   Constipation    COVID-19    Dysrhythmia    Elevated liver enzymes    GERD (gastroesophageal reflux disease)    Hemorrhoids    Herpes genitalis    High cholesterol    Hyperlipidemia    Hypertension    Hypothyroidism    IDA (iron deficiency anemia) 02/09/2021   Neuropathy    Osteoarthritis    Sleep apnea    CPAP   Past Surgical History:  Procedure Laterality Date   ABDOMINAL HYSTERECTOMY     total for fibroids no h/o abnormal pap   bariatric sleeve  2015   BREAST EXCISIONAL BIOPSY Left 1998   carpal tunnel repair     CATARACT EXTRACTION W/PHACO Right 11/01/2021   Procedure: CATARACT EXTRACTION PHACO AND INTRAOCULAR LENS PLACEMENT (IOC) RIGHT;  Surgeon: Nevada Crane, MD;  Location: [facility];  Service: Ophthalmology;  Laterality: Right;  sleep apnea 4.95 00:49.7   CATARACT EXTRACTION W/PHACO Left 11/15/2021   Procedure: CATARACT EXTRACTION PHACO AND INTRAOCULAR LENS PLACEMENT (IOC) LEFT 4.94 00:32.3;  Surgeon: Nevada Crane, MD;  Location: [facility];  Service: Ophthalmology;  Laterality: Left;  sleep  apnea   COLONOSCOPY WITH PROPOFOL N/A 02/11/2016   Procedure: COLONOSCOPY WITH PROPOFOL;  Surgeon: Wyline Mood, MD;  Location: [facility] ENDOSCOPY;  Service: Endoscopy;  Laterality: N/A;   COLONOSCOPY WITH PROPOFOL N/A 10/01/2019   Procedure: COLONOSCOPY WITH PROPOFOL;  Surgeon: Wyline Mood, MD;  Location: [facility] ENDOSCOPY;  Service: Gastroenterology;  Laterality: N/A;   COLONOSCOPY WITH PROPOFOL N/A 11/19/2020   Procedure: COLONOSCOPY WITH PROPOFOL;  Surgeon: Wyline Mood, MD;  Location: [facility] ENDOSCOPY;  Service: Gastroenterology;  Laterality: N/A;   COLONOSCOPY WITH PROPOFOL N/A 05/05/2022   Procedure: COLONOSCOPY WITH PROPOFOL;  Surgeon: Regis Bill, MD;  Location: [facility] ENDOSCOPY;  Service: Endoscopy;  Laterality: N/A;   ESOPHAGOGASTRODUODENOSCOPY (EGD) WITH PROPOFOL N/A 12/02/2019   Procedure: ESOPHAGOGASTRODUODENOSCOPY (EGD) WITH PROPOFOL;  Surgeon: Wyline Mood, MD;  Location: [facility] ENDOSCOPY;  Service: Gastroenterology;  Laterality: N/A;   ESOPHAGOGASTRODUODENOSCOPY (EGD) WITH PROPOFOL N/A 11/19/2020   Procedure: ESOPHAGOGASTRODUODENOSCOPY (EGD) WITH PROPOFOL;  Surgeon: Wyline Mood, MD;  Location: [facility] ENDOSCOPY;  Service: Gastroenterology;  Laterality: N/A;   ESOPHAGOGASTRODUODENOSCOPY (EGD) WITH PROPOFOL N/A 05/05/2022   Procedure: ESOPHAGOGASTRODUODENOSCOPY (EGD) WITH PROPOFOL;  Surgeon: Regis Bill, MD;  Location: [facility] ENDOSCOPY;  Service: Endoscopy;  Laterality: N/A;   FLEXIBLE SIGMOIDOSCOPY N/A 02/06/2021   Procedure: FLEXIBLE SIGMOIDOSCOPY;  Surgeon: Jaynie Collins, DO;  Location: [facility] ENDOSCOPY;  Service: Gastroenterology;  Laterality: N/A;   GIVENS CAPSULE STUDY N/A 06/07/2021   Procedure: GIVENS CAPSULE STUDY;  Surgeon: Toney Reil, MD;  Location: [facility] ENDOSCOPY;  Service: Gastroenterology;  Laterality: N/A;   GIVENS CAPSULE STUDY N/A 06/09/2021   Procedure: GIVENS CAPSULE STUDY;  Surgeon: Toney Reil, MD;  Location: [facility] ENDOSCOPY;  Service:  Gastroenterology;  Laterality: N/A;   HEMORRHOID SURGERY     LAPAROSCOPIC GASTRIC RESTRICTIVE DUODENAL PROCEDURE (DUODENAL SWITCH) Bilateral    2020   Patient Active Problem List   Diagnosis Date Noted   Friction blister 07/22/2022   Acute on chronic anemia 05/03/2022   Melena 05/03/2022   Alcohol use disorder 05/03/2022   Transaminitis 11/22/2021   Tinea pedis 08/31/2021   Upper GI bleed 06/06/2021   Eye discharge 02/16/2021   IDA (iron deficiency anemia) 02/09/2021   Acute blood loss anemia 02/06/2021   Elevated liver function tests    Bright red blood per rectum 02/05/2021   Open wound of left heel 01/20/2021   Anxiety and depression 11/25/2020   CPAP (continuous positive airway pressure) dependence 11/25/2020   Sinusitis 11/25/2020   Allergic conjunctivitis of both eyes and rhinitis 11/25/2020   GI bleed 11/17/2020   Chronic iron deficiency anemia    Symptomatic anemia 11/11/2020   Postop check 08/07/2020   Biliary sludge determined by ultrasound 07/02/2020   Right upper quadrant abdominal pain 06/10/2020   Right-sided thoracic back pain 05/18/2020   Muscle spasm 05/18/2020   Bilateral tinnitus 04/30/2020   Irregular heartbeat 02/25/2020   Diabetes mellitus without complication (HCC) 02/25/2020   Strain of right knee 02/10/2020   Aspirin long-term use 10/22/2019   Wound ballistics 08/29/2019   Displacement of lumbar intervertebral disc without myelopathy 08/29/2019   Helicobacter pylori gastrointestinal tract infection 08/29/2019   Hypercholesterolemia 08/29/2019   Phlebitis after infusion 06/26/2019   Superficial thrombophlebitis of left upper extremity 06/26/2019   BMI 38.0-38.9,adult 06/19/2019   Small bowel obstruction (HCC) 05/25/2019   Morbid obesity (HCC) 05/16/2019   Cervical pain (neck) 04/09/2019   Pedal edema 03/06/2019   Pre-operative clearance 03/06/2019   History of sleeve gastrectomy 01/23/2019   Chronic pain syndrome 10/03/2018   Obstructive  sleep apnea 10/03/2018   Right shoulder pain 06/08/2018   Chronic venous insufficiency 05/24/2018   Lymphedema 05/24/2018   Lumbar spondylosis 05/17/2018   Leg swelling 05/09/2018   Neuropathy 01/24/2018   Insomnia 07/12/2017   Fracture of phalanx of finger 05/31/2017   Impingement syndrome of shoulder region 05/31/2017   Hemorrhoids 12/15/2016   Hypothyroidism 10/03/2016   Chronic shoulder bursitis 08/30/2016   Positive ANA (antinuclear antibody) 08/30/2016   Bradycardia 06/16/2016   History of adenomatous polyp of colon 05/02/2016   Elevated liver enzymes 04/06/2016   OSA (obstructive sleep apnea) 04/06/2016   Bilateral shoulder pain 01/13/2016   Fatty liver 12/08/2015   Arthritis 12/08/2015   History of bariatric surgery 12/07/2015   Genital herpes 11/11/2015   Hyperlipidemia 11/11/2015   Essential hypertension 11/11/2015   Routine physical examination 11/11/2015   Allergic rhinitis 11/11/2015   GERD (gastroesophageal reflux disease) 11/11/2015   Herpesviral infection, unspecified 01/08/2014   Disorder of thyroid, unspecified 01/08/2014   Lumbar radiculitis 11/19/2013   Neuritis or radiculitis due to rupture of lumbar intervertebral disc 11/19/2013   Monilial vaginitis 08/20/2013   Gastritis and duodenitis 07/25/2013   Impaired fasting glucose 07/17/2012   Obesity, unspecified 07/17/2012   Obesity 07/17/2012   Depression 03/15/2012   Abnormal electrocardiogram (  ECG) (EKG) 03/06/2012   URI (upper respiratory infection) 01/16/2012    ONSET DATE: 1 and 1/2 years ago  REFERRING DIAG: G62.9 (ICD-10-CM) - Neuropathy   THERAPY DIAG:  Difficulty in walking, not elsewhere classified  Muscle weakness (generalized)  Unsteadiness on feet  Rationale for Evaluation and Treatment: Rehabilitation  SUBJECTIVE:                                                                                                                                                                                              SUBJECTIVE STATEMENT:  Vein and vascular doctor approved return to PT. Reports the foot doctor said her R foot is taller than the left     Pt accompanied by: self  PERTINENT HISTORY: Nariya Wandrey is a 72 year old female with past medical History of iron deficiency anemia (receiving iron infusions and recent right heel ulcer (healed per chart) and extensive medical history as reported in above section. Paitent was seen by PCP and diagnosed with gait/balance impairments.   PAIN:  Are you having pain? Yes: NPRS scale: 4/10 Pain location: bilateral lower legs into feet Pain description: Burning, tingling, numbness, cold Aggravating factors: increased standing > 15 min- worse pain at night Relieving factors: Gabapentin/tylenol, heating pad  PRECAUTIONS: Fall  RED FLAGS: None   WEIGHT BEARING RESTRICTIONS: No  FALLS: Has patient fallen in last 6 months? No  LIVING ENVIRONMENT: Lives with: lives with their spouse Lives in: House/apartment Stairs: Yes: External: 3-4 steps; on left going up Has following equipment at home: Single point cane and Grab bars  PLOF: Independent  PATIENT GOALS: Improve my balance.   OBJECTIVE:   DIAGNOSTIC FINDINGS: None avaible in chart  COGNITION: Overall cognitive status: Within functional limits for tasks assessed   SENSATION: Light touch: Impaired - Bilateral LE  COORDINATION: Slow/delay reaction  EDEMA:  None observed   POSTURE: forward head  LOWER EXTREMITY ROM:      LOWER EXTREMITY MMT:    MMT Right Eval Left Eval  Hip flexion 4- 4-  Hip extension 4- 4-  Hip abduction 4- 4-  Hip adduction 4 4  Hip internal rotation 3+ 3+  Hip external rotation 3+ 3+  Knee flexion 4 4  Knee extension 4 4  Ankle dorsiflexion 4 4  Ankle plantarflexion    Ankle inversion    Ankle eversion    (Blank rows = not tested)  TRANSFERS: Assistive device utilized: None  Sit to stand: Complete Independence Stand to  sit: Complete Independence Chair to chair: Complete Independence Floor:  Not tested    GAIT: Gait pattern: decreased step length- Right,  decreased step length- Left, and shuffling unsteady Distance walked: 120 feet Assistive device utilized: None Level of assistance: CGA Comments: unsteady intermittent  FUNCTIONAL TESTS:  5 times sit to stand: 27.61 sec without UE support  Timed up and go (TUG): 18.33 sec 10 meter walk test: 0.66 m/s Berg Balance Scale: 42/56  PATIENT SURVEYS:  FOTO 40  TODAY'S TREATMENT:                                                                                                                              DATE:   NMR: in // bars with CGA and gait belt  Forward/backward walking 3x length  Airex balance beam: -lateral stepping 6x length -ball pass static stand 10x -PVC pipe chest press 10x, overhead press 10x   ambulate in hallway: -horizontal head turns with cues for reading alphabet from cards 86 ftx 2 sets -horizontal head turns with cues for reading number and symbols from cards 86 ft x 2 sets  -"red light/green light" for sudden initiation/termination of ambulation with close CGA for carryover to natural environment 2x 86 ft   THEREX: Donned 3# AW - High knee march/walk down and back with and without UE support x 6  -side step walk in // bars - down and back with UE support x 3 with 3#  -backwards walking in // bars 4x length with 3#ankle weight   Seated:  LAQ with ball adduction 10x STS 10x   Stage II heel lift in L shoe  Unless otherwise stated, CGA was provided and gait belt donned in order to ensure pt safety   PATIENT EDUCATION: Education details: Purpose of PT/plan of care Person educated: Patient Education method: Explanation Education comprehension: verbalized understanding  HOME EXERCISE PROGRAM:  *Issued RTB for home use Access Code: [email or web address] URL: [email or web address] Date: 09/20/2022 Prepared by:  Maureen Ralphs  Exercises - Seated Hip Flexion March with Ankle Weights  - 1 x daily - 3 x weekly - 3 sets - 10 reps - Seated Long Arc Quad with Ankle Weight  - 1 x daily - 3 x weekly - 3 sets - 10 reps - Seated Hip Flexion and External Rotation  - 1 x daily - 3 x weekly - 3 sets - 10 reps - Seated Hip Internal Rotation with Resistance  - 1 x daily - 3 x weekly - 3 sets - 10 reps - Seated Hamstring Curl with Anchored Resistance  - 1 x daily - 3 x weekly - 3 sets - 10 reps - Sit to Stand with Arms Crossed  - 1 x daily - 3 x weekly - 3 sets - 10 reps  GOALS: Goals reviewed with patient? Yes  SHORT TERM GOALS: Target date: 10/27/2022  Pt will be independent with HEP in order to improve strength and balance in order to decrease fall risk and improve function at home and work.  Baseline: EVAL- Patient has no formal HEP in place Goal status:  INITIAL   LONG TERM GOALS: Target date: 12/08/2022   1.  Patient (> 74 years old) will complete five times sit to stand test in < 15 seconds indicating an increased LE strength and improved balance. Baseline: EVAL= 27.61 sec Goal status: INITIAL  2.  Patient will increase FOTO score to equal to or greater than  54   to demonstrate statistically significant improvement in mobility and quality of life.  Baseline: EVAL= 40 Goal status: INITIAL   3.  Patient will increase Berg Balance score by > 6 points to demonstrate decreased fall risk during functional activities. Baseline: EVAL: 42/56 Goal status: INITIAL   4.  Patient will reduce timed up and go to <13 seconds to reduce fall risk and demonstrate improved transfer/gait ability. Baseline: EVAL= 18.33 sec Goal status: INITIAL  5.  Patient will increase 10 meter walk test to >1.75m/s as to improve gait speed for better community ambulation and to reduce fall risk. Baseline: EVAL= 0.66 m/s Goal status: INITIAL ASSESSMENT:  CLINICAL IMPRESSION:   Patient has been cleared to return to PT  from physician. Heel lift added to L foot for neutralizing leg length discrepancy. Patient educated and aware of use of lift. She requires intermittent rest breaks due to fatigue but remains highly  motivated throughout session. She is eager to progress her functional stability and mobility. Patient has LE hyperextension with prolonged ambulation. Pt will continue to benefit from skilled physical therapy intervention to address impairments, improve QOL, and attain therapy goals.      OBJECTIVE IMPAIRMENTS: Abnormal gait, decreased activity tolerance, decreased balance, decreased coordination, decreased endurance, decreased mobility, difficulty walking, decreased strength, impaired sensation, and pain.   ACTIVITY LIMITATIONS: carrying, lifting, bending, standing, squatting, stairs, transfers, and bathing  PARTICIPATION LIMITATIONS: cleaning, laundry, shopping, community activity, and yard work  PERSONAL FACTORS: 3+ comorbidities: HTN, anemia, DM  are also affecting patient's functional outcome.   REHAB POTENTIAL: Good  CLINICAL DECISION MAKING: Stable/uncomplicated  EVALUATION COMPLEXITY: Moderate  PLAN:  PT FREQUENCY: 1-2x/week  PT DURATION: 12 weeks  PLANNED INTERVENTIONS: Therapeutic exercises, Therapeutic activity, Neuromuscular re-education, Balance training, Gait training, Patient/Family education, Self Care, Joint mobilization, Joint manipulation, Stair training, Vestibular training, Canalith repositioning, DME instructions, Dry Needling, Spinal manipulation, Spinal mobilization, Cryotherapy, Moist heat, Manual therapy, and Re-evaluation  PLAN FOR NEXT SESSION: Instruct in LE strengthening and balance and review/progress HEP.    Precious Bard, PT 11/09/2022, 2:45 PM

## 2022-11-09 ENCOUNTER — Ambulatory Visit: Payer: Medicare Other | Attending: Family

## 2022-11-09 DIAGNOSIS — R262 Difficulty in walking, not elsewhere classified: Secondary | ICD-10-CM | POA: Insufficient documentation

## 2022-11-09 DIAGNOSIS — M6281 Muscle weakness (generalized): Secondary | ICD-10-CM | POA: Diagnosis not present

## 2022-11-09 DIAGNOSIS — R2681 Unsteadiness on feet: Secondary | ICD-10-CM | POA: Insufficient documentation

## 2022-11-09 NOTE — Telephone Encounter (Signed)
LVM to call back to schedule BP f/up appt with Claris Che

## 2022-11-11 NOTE — Telephone Encounter (Signed)
Pt has scheduled appt with Claris Che

## 2022-11-14 ENCOUNTER — Ambulatory Visit: Payer: Medicare Other

## 2022-11-14 DIAGNOSIS — Z23 Encounter for immunization: Secondary | ICD-10-CM

## 2022-11-14 DIAGNOSIS — Z719 Counseling, unspecified: Secondary | ICD-10-CM

## 2022-11-14 NOTE — Therapy (Signed)
OUTPATIENT PHYSICAL THERAPY NEURO TREATMENT  Patient Name: Frica [name] MRN: [id number] DOB:[birth year], 72 y.o., female Today's Date: 11/15/2022   PCP: [name], FNP REFERRING PROVIDER: [name], FNP  END OF SESSION:  PT End of Session - 11/15/22 1056     Visit Number 9    Number of Visits 24    Date for PT Re-Evaluation 12/08/22    PT Start Time 1100    PT Stop Time 1144    PT Time Calculation (min) 44 min    Equipment Utilized During Treatment Gait belt    Activity Tolerance Patient tolerated treatment well                  Past Medical History:  Diagnosis Date   Anemia    Anxiety    Bariatric surgery status    Complication of anesthesia    Diificulty breathing for about 15 minutes after bariatric surgery   Constipation    COVID-19    Dysrhythmia    Elevated liver enzymes    GERD (gastroesophageal reflux disease)    Hemorrhoids    Herpes genitalis    High cholesterol    Hyperlipidemia    Hypertension    Hypothyroidism    IDA (iron deficiency anemia) 02/09/2021   Neuropathy    Osteoarthritis    Sleep apnea    CPAP   Past Surgical History:  Procedure Laterality Date   ABDOMINAL HYSTERECTOMY     total for fibroids no h/o abnormal pap   bariatric sleeve  2015   BREAST EXCISIONAL BIOPSY Left 1998   carpal tunnel repair     CATARACT EXTRACTION W/PHACO Right 11/01/2021   Procedure: CATARACT EXTRACTION PHACO AND INTRAOCULAR LENS PLACEMENT (IOC) RIGHT;  Surgeon: Nevada Crane, MD;  Location: [facility];  Service: Ophthalmology;  Laterality: Right;  sleep apnea 4.95 00:49.7   CATARACT EXTRACTION W/PHACO Left 11/15/2021   Procedure: CATARACT EXTRACTION PHACO AND INTRAOCULAR LENS PLACEMENT (IOC) LEFT 4.94 00:32.3;  Surgeon: Nevada Crane, MD;  Location: [facility];  Service: Ophthalmology;  Laterality: Left;  sleep apnea   COLONOSCOPY WITH PROPOFOL N/A 02/11/2016   Procedure: COLONOSCOPY WITH PROPOFOL;   Surgeon: Wyline Mood, MD;  Location: [facility] ENDOSCOPY;  Service: Endoscopy;  Laterality: N/A;   COLONOSCOPY WITH PROPOFOL N/A 10/01/2019   Procedure: COLONOSCOPY WITH PROPOFOL;  Surgeon: Wyline Mood, MD;  Location: [facility] ENDOSCOPY;  Service: Gastroenterology;  Laterality: N/A;   COLONOSCOPY WITH PROPOFOL N/A 11/19/2020   Procedure: COLONOSCOPY WITH PROPOFOL;  Surgeon: Wyline Mood, MD;  Location: [facility] ENDOSCOPY;  Service: Gastroenterology;  Laterality: N/A;   COLONOSCOPY WITH PROPOFOL N/A 05/05/2022   Procedure: COLONOSCOPY WITH PROPOFOL;  Surgeon: Regis Bill, MD;  Location: [facility] ENDOSCOPY;  Service: Endoscopy;  Laterality: N/A;   ESOPHAGOGASTRODUODENOSCOPY (EGD) WITH PROPOFOL N/A 12/02/2019   Procedure: ESOPHAGOGASTRODUODENOSCOPY (EGD) WITH PROPOFOL;  Surgeon: Wyline Mood, MD;  Location: [facility] ENDOSCOPY;  Service: Gastroenterology;  Laterality: N/A;   ESOPHAGOGASTRODUODENOSCOPY (EGD) WITH PROPOFOL N/A 11/19/2020   Procedure: ESOPHAGOGASTRODUODENOSCOPY (EGD) WITH PROPOFOL;  Surgeon: Wyline Mood, MD;  Location: [facility] ENDOSCOPY;  Service: Gastroenterology;  Laterality: N/A;   ESOPHAGOGASTRODUODENOSCOPY (EGD) WITH PROPOFOL N/A 05/05/2022   Procedure: ESOPHAGOGASTRODUODENOSCOPY (EGD) WITH PROPOFOL;  Surgeon: Regis Bill, MD;  Location: [facility] ENDOSCOPY;  Service: Endoscopy;  Laterality: N/A;   FLEXIBLE SIGMOIDOSCOPY N/A 02/06/2021   Procedure: FLEXIBLE SIGMOIDOSCOPY;  Surgeon: Jaynie Collins, DO;  Location: [facility] ENDOSCOPY;  Service: Gastroenterology;  Laterality: N/A;   GIVENS CAPSULE STUDY N/A 06/07/2021  Procedure: GIVENS CAPSULE STUDY;  Surgeon: Toney Reil, MD;  Location: [facility] ENDOSCOPY;  Service: Gastroenterology;  Laterality: N/A;   GIVENS CAPSULE STUDY N/A 06/09/2021   Procedure: GIVENS CAPSULE STUDY;  Surgeon: Toney Reil, MD;  Location: [facility] ENDOSCOPY;  Service: Gastroenterology;  Laterality: N/A;   HEMORRHOID SURGERY     LAPAROSCOPIC GASTRIC RESTRICTIVE DUODENAL  PROCEDURE (DUODENAL SWITCH) Bilateral    2020   Patient Active Problem List   Diagnosis Date Noted   Friction blister 07/22/2022   Acute on chronic anemia 05/03/2022   Melena 05/03/2022   Alcohol use disorder 05/03/2022   Transaminitis 11/22/2021   Tinea pedis 08/31/2021   Upper GI bleed 06/06/2021   Eye discharge 02/16/2021   IDA (iron deficiency anemia) 02/09/2021   Acute blood loss anemia 02/06/2021   Elevated liver function tests    Bright red blood per rectum 02/05/2021   Open wound of left heel 01/20/2021   Anxiety and depression 11/25/2020   CPAP (continuous positive airway pressure) dependence 11/25/2020   Sinusitis 11/25/2020   Allergic conjunctivitis of both eyes and rhinitis 11/25/2020   GI bleed 11/17/2020   Chronic iron deficiency anemia    Symptomatic anemia 11/11/2020   Postop check 08/07/2020   Biliary sludge determined by ultrasound 07/02/2020   Right upper quadrant abdominal pain 06/10/2020   Right-sided thoracic back pain 05/18/2020   Muscle spasm 05/18/2020   Bilateral tinnitus 04/30/2020   Irregular heartbeat 02/25/2020   Diabetes mellitus without complication (HCC) 02/25/2020   Strain of right knee 02/10/2020   Aspirin long-term use 10/22/2019   Wound ballistics 08/29/2019   Displacement of lumbar intervertebral disc without myelopathy 08/29/2019   Helicobacter pylori gastrointestinal tract infection 08/29/2019   Hypercholesterolemia 08/29/2019   Phlebitis after infusion 06/26/2019   Superficial thrombophlebitis of left upper extremity 06/26/2019   BMI 38.0-38.9,adult 06/19/2019   Small bowel obstruction (HCC) 05/25/2019   Morbid obesity (HCC) 05/16/2019   Cervical pain (neck) 04/09/2019   Pedal edema 03/06/2019   Pre-operative clearance 03/06/2019   History of sleeve gastrectomy 01/23/2019   Chronic pain syndrome 10/03/2018   Obstructive sleep apnea 10/03/2018   Right shoulder pain 06/08/2018   Chronic venous insufficiency 05/24/2018    Lymphedema 05/24/2018   Lumbar spondylosis 05/17/2018   Leg swelling 05/09/2018   Neuropathy 01/24/2018   Insomnia 07/12/2017   Fracture of phalanx of finger 05/31/2017   Impingement syndrome of shoulder region 05/31/2017   Hemorrhoids 12/15/2016   Hypothyroidism 10/03/2016   Chronic shoulder bursitis 08/30/2016   Positive ANA (antinuclear antibody) 08/30/2016   Bradycardia 06/16/2016   History of adenomatous polyp of colon 05/02/2016   Elevated liver enzymes 04/06/2016   OSA (obstructive sleep apnea) 04/06/2016   Bilateral shoulder pain 01/13/2016   Fatty liver 12/08/2015   Arthritis 12/08/2015   History of bariatric surgery 12/07/2015   Genital herpes 11/11/2015   Hyperlipidemia 11/11/2015   Essential hypertension 11/11/2015   Routine physical examination 11/11/2015   Allergic rhinitis 11/11/2015   GERD (gastroesophageal reflux disease) 11/11/2015   Herpesviral infection, unspecified 01/08/2014   Disorder of thyroid, unspecified 01/08/2014   Lumbar radiculitis 11/19/2013   Neuritis or radiculitis due to rupture of lumbar intervertebral disc 11/19/2013   Monilial vaginitis 08/20/2013   Gastritis and duodenitis 07/25/2013   Impaired fasting glucose 07/17/2012   Obesity, unspecified 07/17/2012   Obesity 07/17/2012   Depression 03/15/2012   Abnormal electrocardiogram (ECG) (EKG) 03/06/2012   URI (upper respiratory infection) 01/16/2012    ONSET DATE: 1 and 1/2  years ago  REFERRING DIAG: G62.9 (ICD-10-CM) - Neuropathy   THERAPY DIAG:  Difficulty in walking, not elsewhere classified  Muscle weakness (generalized)  Unsteadiness on feet  Rationale for Evaluation and Treatment: Rehabilitation  SUBJECTIVE:                                                                                                                                                                                             SUBJECTIVE STATEMENT:  Vein and vascular doctor approved return to PT last  week. Reports the foot doctor said her R foot is taller than the left and she has been using her heel lift provided last session. She states her right ankle is a little achy near the inner heel.     Pt accompanied by: self  PERTINENT HISTORY: Pearlene Hickmott is a 72 year old female with past medical History of iron deficiency anemia (receiving iron infusions and recent right heel ulcer (healed per chart) and extensive medical history as reported in above section. Paitent was seen by PCP and diagnosed with gait/balance impairments.   PAIN:  Are you having pain? Yes: NPRS scale: 4/10 Pain location: bilateral lower legs into feet Pain description: Burning, tingling, numbness, cold Aggravating factors: increased standing > 15 min- worse pain at night Relieving factors: Gabapentin/tylenol, heating pad  PRECAUTIONS: Fall  RED FLAGS: None   WEIGHT BEARING RESTRICTIONS: No  FALLS: Has patient fallen in last 6 months? No  LIVING ENVIRONMENT: Lives with: lives with their spouse Lives in: House/apartment Stairs: Yes: External: 3-4 steps; on left going up Has following equipment at home: Single point cane and Grab bars  PLOF: Independent  PATIENT GOALS: Improve my balance.   OBJECTIVE:   DIAGNOSTIC FINDINGS: None avaible in chart  COGNITION: Overall cognitive status: Within functional limits for tasks assessed   SENSATION: Light touch: Impaired - Bilateral LE  COORDINATION: Slow/delay reaction  EDEMA:  None observed   POSTURE: forward head  LOWER EXTREMITY ROM:      LOWER EXTREMITY MMT:    MMT Right Eval Left Eval  Hip flexion 4- 4-  Hip extension 4- 4-  Hip abduction 4- 4-  Hip adduction 4 4  Hip internal rotation 3+ 3+  Hip external rotation 3+ 3+  Knee flexion 4 4  Knee extension 4 4  Ankle dorsiflexion 4 4  Ankle plantarflexion    Ankle inversion    Ankle eversion    (Blank rows = not tested)  TRANSFERS: Assistive device utilized: None  Sit to  stand: Complete Independence Stand to sit: Complete Independence Chair to chair: Complete Independence Floor:  Not tested  GAIT: Gait pattern: decreased step length- Right, decreased step length- Left, and shuffling unsteady Distance walked: 120 feet Assistive device utilized: None Level of assistance: CGA Comments: unsteady intermittent  FUNCTIONAL TESTS:  5 times sit to stand: 27.61 sec without UE support  Timed up and go (TUG): 18.33 sec 10 meter walk test: 0.66 m/s Berg Balance Scale: 42/56  PATIENT SURVEYS:  FOTO 40  TODAY'S TREATMENT:                                                                                                                              DATE: 11/15/22   THEREX: -Seated lateral hurdle step overs 2x10 each LE -STS 2x10  -Seated hamstring curls RTB 2x10 each LE -Foot taps with UE support x20 alternating LE in // bars -Foot taps without UE support 2x20 alternating LE in //bars  -Step ups 2x10 each LE first trial with UE support, second without- more difficult to perform on the right leg  - Walking 300 ft with single point cane and 3 lb ankle weights  NMR in // bars  Airex with no UE support  -eyes open x30s -eyes closed x30s -horizontal head turns x30s - vertical head turns x30s - lateral steps on airex beam x4 with both UE support, x2 with one UE support  Walking length of parallel bars without UE  - forwards x2   - backwards x2   - horizontal head turns x2   - eyes closed x4, decreased cadence   Unless otherwise stated, CGA was provided and gait belt donned in order to ensure pt safety   PATIENT EDUCATION: Education details: Purpose of PT/plan of care Person educated: Patient Education method: Explanation Education comprehension: verbalized understanding  HOME EXERCISE PROGRAM:  *Issued RTB for home use Access Code: [email or web address] URL: [email or web address] Date: 09/20/2022 Prepared by: Maureen Ralphs  Exercises -  Seated Hip Flexion March with Ankle Weights  - 1 x daily - 3 x weekly - 3 sets - 10 reps - Seated Long Arc Quad with Ankle Weight  - 1 x daily - 3 x weekly - 3 sets - 10 reps - Seated Hip Flexion and External Rotation  - 1 x daily - 3 x weekly - 3 sets - 10 reps - Seated Hip Internal Rotation with Resistance  - 1 x daily - 3 x weekly - 3 sets - 10 reps - Seated Hamstring Curl with Anchored Resistance  - 1 x daily - 3 x weekly - 3 sets - 10 reps - Sit to Stand with Arms Crossed  - 1 x daily - 3 x weekly - 3 sets - 10 reps  GOALS: Goals reviewed with patient? Yes  SHORT TERM GOALS: Target date: 10/27/2022  Pt will be independent with HEP in order to improve strength and balance in order to decrease fall risk and improve function at home and work.  Baseline: EVAL- Patient has no formal HEP in place Goal status: INITIAL  LONG TERM GOALS: Target date: 12/08/2022   1.  Patient (> 51 years old) will complete five times sit to stand test in < 15 seconds indicating an increased LE strength and improved balance. Baseline: EVAL= 27.61 sec Goal status: INITIAL  2.  Patient will increase FOTO score to equal to or greater than  54   to demonstrate statistically significant improvement in mobility and quality of life.  Baseline: EVAL= 40 Goal status: INITIAL   3.  Patient will increase Berg Balance score by > 6 points to demonstrate decreased fall risk during functional activities. Baseline: EVAL: 42/56 Goal status: INITIAL   4.  Patient will reduce timed up and go to <13 seconds to reduce fall risk and demonstrate improved transfer/gait ability. Baseline: EVAL= 18.33 sec Goal status: INITIAL  5.  Patient will increase 10 meter walk test to >1.72m/s as to improve gait speed for better community ambulation and to reduce fall risk. Baseline: EVAL= 0.66 m/s Goal status: INITIAL ASSESSMENT:  CLINICAL IMPRESSION: Patient presents to therapy highly motivated and making progress towards her  goals. She is able to perform step ups on both LE without UE support indicating improvements in both balance and strength. Her right lower extremity is weaker than the left evidenced by less foot clearance on exercises. She is able to ambulate without her assistive device for short distances and no loss of balance. She required less rest breaks compared to previous sessions indicating an increase in endurance. Patient has LE hyperextension with prolonged ambulation. Pt will continue to benefit from skilled physical therapy intervention to address impairments, improve QOL, and attain therapy goals.      OBJECTIVE IMPAIRMENTS: Abnormal gait, decreased activity tolerance, decreased balance, decreased coordination, decreased endurance, decreased mobility, difficulty walking, decreased strength, impaired sensation, and pain.   ACTIVITY LIMITATIONS: carrying, lifting, bending, standing, squatting, stairs, transfers, and bathing  PARTICIPATION LIMITATIONS: cleaning, laundry, shopping, community activity, and yard work  PERSONAL FACTORS: 3+ comorbidities: HTN, anemia, DM  are also affecting patient's functional outcome.   REHAB POTENTIAL: Good  CLINICAL DECISION MAKING: Stable/uncomplicated  EVALUATION COMPLEXITY: Moderate  PLAN:  PT FREQUENCY: 1-2x/week  PT DURATION: 12 weeks  PLANNED INTERVENTIONS: Therapeutic exercises, Therapeutic activity, Neuromuscular re-education, Balance training, Gait training, Patient/Family education, Self Care, Joint mobilization, Joint manipulation, Stair training, Vestibular training, Canalith repositioning, DME instructions, Dry Needling, Spinal manipulation, Spinal mobilization, Cryotherapy, Moist heat, Manual therapy, and Re-evaluation  PLAN FOR NEXT SESSION: Instruct in LE strengthening and balance, review/progress HEP, progress note     Randon Goldsmith, Student-PT 11/15/2022, 12:15 PM   This entire session was performed under direct supervision and  direction of a licensed therapist/therapist assistant . I have personally read, edited and approve of the note as written.  Precious Bard, PT, DPT Physical Therapist - [facility]  Outpatient Physical Therapy- Main [facility] [phone]

## 2022-11-14 NOTE — Progress Notes (Signed)
Patient seen in nurse clinic for COVID vaccine. Comirnaty +12Y 2024-25 IM right deltoid. Tolerated well. VIS provided. [facility] updated and copy provided. Waited 10 minutes.

## 2022-11-15 ENCOUNTER — Ambulatory Visit: Payer: Medicare Other

## 2022-11-15 DIAGNOSIS — R2681 Unsteadiness on feet: Secondary | ICD-10-CM

## 2022-11-15 DIAGNOSIS — M6281 Muscle weakness (generalized): Secondary | ICD-10-CM

## 2022-11-15 DIAGNOSIS — R262 Difficulty in walking, not elsewhere classified: Secondary | ICD-10-CM | POA: Diagnosis not present

## 2022-11-16 NOTE — Therapy (Signed)
OUTPATIENT PHYSICAL THERAPY NEURO TREATMENT  Patient Name: Jennifer [name] MRN: [id number] DOB:09-10-1950, 72 y.o., female Today's Date: 11/16/2022   PCP: [name], FNP REFERRING PROVIDER: [name], FNP  END OF SESSION:         Past Medical History:  Diagnosis Date   Anemia    Anxiety    Bariatric surgery status    Complication of anesthesia    Diificulty breathing for about 15 minutes after bariatric surgery   Constipation    COVID-19    Dysrhythmia    Elevated liver enzymes    GERD (gastroesophageal reflux disease)    Hemorrhoids    Herpes genitalis    High cholesterol    Hyperlipidemia    Hypertension    Hypothyroidism    IDA (iron deficiency anemia) 02/09/2021   Neuropathy    Osteoarthritis    Sleep apnea    CPAP   Past Surgical History:  Procedure Laterality Date   ABDOMINAL HYSTERECTOMY     total for fibroids no h/o abnormal pap   bariatric sleeve  2015   BREAST EXCISIONAL BIOPSY Left 1998   carpal tunnel repair     CATARACT EXTRACTION W/PHACO Right 11/01/2021   Procedure: CATARACT EXTRACTION PHACO AND INTRAOCULAR LENS PLACEMENT (IOC) RIGHT;  Surgeon: Nevada Crane, MD;  Location: [facility];  Service: Ophthalmology;  Laterality: Right;  sleep apnea 4.95 00:49.7   CATARACT EXTRACTION W/PHACO Left 11/15/2021   Procedure: CATARACT EXTRACTION PHACO AND INTRAOCULAR LENS PLACEMENT (IOC) LEFT 4.94 00:32.3;  Surgeon: Nevada Crane, MD;  Location: [facility];  Service: Ophthalmology;  Laterality: Left;  sleep apnea   COLONOSCOPY WITH PROPOFOL N/A 02/11/2016   Procedure: COLONOSCOPY WITH PROPOFOL;  Surgeon: Wyline Mood, MD;  Location: [facility] ENDOSCOPY;  Service: Endoscopy;  Laterality: N/A;   COLONOSCOPY WITH PROPOFOL N/A 10/01/2019   Procedure: COLONOSCOPY WITH PROPOFOL;  Surgeon: Wyline Mood, MD;  Location: [facility] ENDOSCOPY;  Service: Gastroenterology;  Laterality: N/A;   COLONOSCOPY WITH PROPOFOL N/A 11/19/2020    Procedure: COLONOSCOPY WITH PROPOFOL;  Surgeon: Wyline Mood, MD;  Location: [facility] ENDOSCOPY;  Service: Gastroenterology;  Laterality: N/A;   COLONOSCOPY WITH PROPOFOL N/A 05/05/2022   Procedure: COLONOSCOPY WITH PROPOFOL;  Surgeon: Regis Bill, MD;  Location: [facility] ENDOSCOPY;  Service: Endoscopy;  Laterality: N/A;   ESOPHAGOGASTRODUODENOSCOPY (EGD) WITH PROPOFOL N/A 12/02/2019   Procedure: ESOPHAGOGASTRODUODENOSCOPY (EGD) WITH PROPOFOL;  Surgeon: Wyline Mood, MD;  Location: [facility] ENDOSCOPY;  Service: Gastroenterology;  Laterality: N/A;   ESOPHAGOGASTRODUODENOSCOPY (EGD) WITH PROPOFOL N/A 11/19/2020   Procedure: ESOPHAGOGASTRODUODENOSCOPY (EGD) WITH PROPOFOL;  Surgeon: Wyline Mood, MD;  Location: [facility] ENDOSCOPY;  Service: Gastroenterology;  Laterality: N/A;   ESOPHAGOGASTRODUODENOSCOPY (EGD) WITH PROPOFOL N/A 05/05/2022   Procedure: ESOPHAGOGASTRODUODENOSCOPY (EGD) WITH PROPOFOL;  Surgeon: Regis Bill, MD;  Location: [facility] ENDOSCOPY;  Service: Endoscopy;  Laterality: N/A;   FLEXIBLE SIGMOIDOSCOPY N/A 02/06/2021   Procedure: FLEXIBLE SIGMOIDOSCOPY;  Surgeon: Jaynie Collins, DO;  Location: [facility] ENDOSCOPY;  Service: Gastroenterology;  Laterality: N/A;   GIVENS CAPSULE STUDY N/A 06/07/2021   Procedure: GIVENS CAPSULE STUDY;  Surgeon: Toney Reil, MD;  Location: [facility] ENDOSCOPY;  Service: Gastroenterology;  Laterality: N/A;   GIVENS CAPSULE STUDY N/A 06/09/2021   Procedure: GIVENS CAPSULE STUDY;  Surgeon: Toney Reil, MD;  Location: [facility] ENDOSCOPY;  Service: Gastroenterology;  Laterality: N/A;   HEMORRHOID SURGERY     LAPAROSCOPIC GASTRIC RESTRICTIVE DUODENAL PROCEDURE (DUODENAL SWITCH) Bilateral    2020   Patient Active Problem List   Diagnosis Date Noted  Friction blister 07/22/2022   Acute on chronic anemia 05/03/2022   Melena 05/03/2022   Alcohol use disorder 05/03/2022   Transaminitis 11/22/2021   Tinea pedis 08/31/2021   Upper GI bleed 06/06/2021   Eye  discharge 02/16/2021   IDA (iron deficiency anemia) 02/09/2021   Acute blood loss anemia 02/06/2021   Elevated liver function tests    Bright red blood per rectum 02/05/2021   Open wound of left heel 01/20/2021   Anxiety and depression 11/25/2020   CPAP (continuous positive airway pressure) dependence 11/25/2020   Sinusitis 11/25/2020   Allergic conjunctivitis of both eyes and rhinitis 11/25/2020   GI bleed 11/17/2020   Chronic iron deficiency anemia    Symptomatic anemia 11/11/2020   Postop check 08/07/2020   Biliary sludge determined by ultrasound 07/02/2020   Right upper quadrant abdominal pain 06/10/2020   Right-sided thoracic back pain 05/18/2020   Muscle spasm 05/18/2020   Bilateral tinnitus 04/30/2020   Irregular heartbeat 02/25/2020   Diabetes mellitus without complication (HCC) 02/25/2020   Strain of right knee 02/10/2020   Aspirin long-term use 10/22/2019   Wound ballistics 08/29/2019   Displacement of lumbar intervertebral disc without myelopathy 08/29/2019   Helicobacter pylori gastrointestinal tract infection 08/29/2019   Hypercholesterolemia 08/29/2019   Phlebitis after infusion 06/26/2019   Superficial thrombophlebitis of left upper extremity 06/26/2019   BMI 38.0-38.9,adult 06/19/2019   Small bowel obstruction (HCC) 05/25/2019   Morbid obesity (HCC) 05/16/2019   Cervical pain (neck) 04/09/2019   Pedal edema 03/06/2019   Pre-operative clearance 03/06/2019   History of sleeve gastrectomy 01/23/2019   Chronic pain syndrome 10/03/2018   Obstructive sleep apnea 10/03/2018   Right shoulder pain 06/08/2018   Chronic venous insufficiency 05/24/2018   Lymphedema 05/24/2018   Lumbar spondylosis 05/17/2018   Leg swelling 05/09/2018   Neuropathy 01/24/2018   Insomnia 07/12/2017   Fracture of phalanx of finger 05/31/2017   Impingement syndrome of shoulder region 05/31/2017   Hemorrhoids 12/15/2016   Hypothyroidism 10/03/2016   Chronic shoulder bursitis 08/30/2016    Positive ANA (antinuclear antibody) 08/30/2016   Bradycardia 06/16/2016   History of adenomatous polyp of colon 05/02/2016   Elevated liver enzymes 04/06/2016   OSA (obstructive sleep apnea) 04/06/2016   Bilateral shoulder pain 01/13/2016   Fatty liver 12/08/2015   Arthritis 12/08/2015   History of bariatric surgery 12/07/2015   Genital herpes 11/11/2015   Hyperlipidemia 11/11/2015   Essential hypertension 11/11/2015   Routine physical examination 11/11/2015   Allergic rhinitis 11/11/2015   GERD (gastroesophageal reflux disease) 11/11/2015   Herpesviral infection, unspecified 01/08/2014   Disorder of thyroid, unspecified 01/08/2014   Lumbar radiculitis 11/19/2013   Neuritis or radiculitis due to rupture of lumbar intervertebral disc 11/19/2013   Monilial vaginitis 08/20/2013   Gastritis and duodenitis 07/25/2013   Impaired fasting glucose 07/17/2012   Obesity, unspecified 07/17/2012   Obesity 07/17/2012   Depression 03/15/2012   Abnormal electrocardiogram (ECG) (EKG) 03/06/2012   URI (upper respiratory infection) 01/16/2012    ONSET DATE: 1 and 1/2 years ago  REFERRING DIAG: G62.9 (ICD-10-CM) - Neuropathy   THERAPY DIAG:  No diagnosis found.  Rationale for Evaluation and Treatment: Rehabilitation  SUBJECTIVE:  SUBJECTIVE STATEMENT: ***  Vein and vascular doctor approved return to PT last week. Reports the foot doctor said her R foot is taller than the left and she has been using her heel lift provided last session. She states her right ankle is a little achy near the inner heel.     Pt accompanied by: self  PERTINENT HISTORY: Dorothy Sather is a 72 year old female with past medical History of iron deficiency anemia (receiving iron infusions and recent right heel ulcer (healed per  chart) and extensive medical history as reported in above section. Paitent was seen by PCP and diagnosed with gait/balance impairments.   PAIN:  Are you having pain? Yes: NPRS scale: 4/10 Pain location: bilateral lower legs into feet Pain description: Burning, tingling, numbness, cold Aggravating factors: increased standing > 15 min- worse pain at night Relieving factors: Gabapentin/tylenol, heating pad  PRECAUTIONS: Fall  RED FLAGS: None   WEIGHT BEARING RESTRICTIONS: No  FALLS: Has patient fallen in last 6 months? No  LIVING ENVIRONMENT: Lives with: lives with their spouse Lives in: House/apartment Stairs: Yes: External: 3-4 steps; on left going up Has following equipment at home: Single point cane and Grab bars  PLOF: Independent  PATIENT GOALS: Improve my balance.   OBJECTIVE:   DIAGNOSTIC FINDINGS: None avaible in chart  COGNITION: Overall cognitive status: Within functional limits for tasks assessed   SENSATION: Light touch: Impaired - Bilateral LE  COORDINATION: Slow/delay reaction  EDEMA:  None observed   POSTURE: forward head  LOWER EXTREMITY ROM:      LOWER EXTREMITY MMT:    MMT Right Eval Left Eval  Hip flexion 4- 4-  Hip extension 4- 4-  Hip abduction 4- 4-  Hip adduction 4 4  Hip internal rotation 3+ 3+  Hip external rotation 3+ 3+  Knee flexion 4 4  Knee extension 4 4  Ankle dorsiflexion 4 4  Ankle plantarflexion    Ankle inversion    Ankle eversion    (Blank rows = not tested)  TRANSFERS: Assistive device utilized: None  Sit to stand: Complete Independence Stand to sit: Complete Independence Chair to chair: Complete Independence Floor:  Not tested    GAIT: Gait pattern: decreased step length- Right, decreased step length- Left, and shuffling unsteady Distance walked: 120 feet Assistive device utilized: None Level of assistance: CGA Comments: unsteady intermittent  FUNCTIONAL TESTS:  5 times sit to stand: 27.61 sec  without UE support  Timed up and go (TUG): 18.33 sec 10 meter walk test: 0.66 m/s Berg Balance Scale: 42/56  PATIENT SURVEYS:  FOTO 40  TODAY'S TREATMENT:                                                                                                                              DATE: 11/16/22   THEREX: -Seated lateral hurdle step overs 2x10 each LE -STS 2x10  -Seated hamstring curls RTB 2x10 each LE -Foot taps with UE support  x20 alternating LE in // bars -Foot taps without UE support 2x20 alternating LE in //bars  -Step ups 2x10 each LE first trial with UE support, second without- more difficult to perform on the right leg  - Walking 300 ft with single point cane and 3 lb ankle weights  NMR in // bars  Airex with no UE support  -eyes open x30s -eyes closed x30s -horizontal head turns x30s - vertical head turns x30s - lateral steps on airex beam x4 with both UE support, x2 with one UE support  Walking length of parallel bars without UE  - forwards x2   - backwards x2   - horizontal head turns x2   - eyes closed x4, decreased cadence   Unless otherwise stated, CGA was provided and gait belt donned in order to ensure pt safety   PATIENT EDUCATION: Education details: Purpose of PT/plan of care Person educated: Patient Education method: Explanation Education comprehension: verbalized understanding  HOME EXERCISE PROGRAM:  *Issued RTB for home use Access Code: [email or web address] URL: [email or web address] Date: 09/20/2022 Prepared by: Maureen Ralphs  Exercises - Seated Hip Flexion March with Ankle Weights  - 1 x daily - 3 x weekly - 3 sets - 10 reps - Seated Long Arc Quad with Ankle Weight  - 1 x daily - 3 x weekly - 3 sets - 10 reps - Seated Hip Flexion and External Rotation  - 1 x daily - 3 x weekly - 3 sets - 10 reps - Seated Hip Internal Rotation with Resistance  - 1 x daily - 3 x weekly - 3 sets - 10 reps - Seated Hamstring Curl with Anchored Resistance   - 1 x daily - 3 x weekly - 3 sets - 10 reps - Sit to Stand with Arms Crossed  - 1 x daily - 3 x weekly - 3 sets - 10 reps  GOALS: Goals reviewed with patient? Yes  SHORT TERM GOALS: Target date: 10/27/2022  Pt will be independent with HEP in order to improve strength and balance in order to decrease fall risk and improve function at home and work.  Baseline: EVAL- Patient has no formal HEP in place Goal status: INITIAL   LONG TERM GOALS: Target date: 12/08/2022   1.  Patient (> 75 years old) will complete five times sit to stand test in < 15 seconds indicating an increased LE strength and improved balance. Baseline: EVAL= 27.61 sec Goal status: INITIAL  2.  Patient will increase FOTO score to equal to or greater than  54   to demonstrate statistically significant improvement in mobility and quality of life.  Baseline: EVAL= 40 Goal status: INITIAL   3.  Patient will increase Berg Balance score by > 6 points to demonstrate decreased fall risk during functional activities. Baseline: EVAL: 42/56 Goal status: INITIAL   4.  Patient will reduce timed up and go to <13 seconds to reduce fall risk and demonstrate improved transfer/gait ability. Baseline: EVAL= 18.33 sec Goal status: INITIAL  5.  Patient will increase 10 meter walk test to >1.29m/s as to improve gait speed for better community ambulation and to reduce fall risk. Baseline: EVAL= 0.66 m/s Goal status: INITIAL ASSESSMENT:  CLINICAL IMPRESSION: *** Patient presents to therapy highly motivated and making progress towards her goals. She is able to perform step ups on both LE without UE support indicating improvements in both balance and strength. Her right lower extremity is weaker than the left evidenced by less foot clearance on  exercises. She is able to ambulate without her assistive device for short distances and no loss of balance. She required less rest breaks compared to previous sessions indicating an increase in  endurance. Patient has LE hyperextension with prolonged ambulation. Pt will continue to benefit from skilled physical therapy intervention to address impairments, improve QOL, and attain therapy goals.      OBJECTIVE IMPAIRMENTS: Abnormal gait, decreased activity tolerance, decreased balance, decreased coordination, decreased endurance, decreased mobility, difficulty walking, decreased strength, impaired sensation, and pain.   ACTIVITY LIMITATIONS: carrying, lifting, bending, standing, squatting, stairs, transfers, and bathing  PARTICIPATION LIMITATIONS: cleaning, laundry, shopping, community activity, and yard work  PERSONAL FACTORS: 3+ comorbidities: HTN, anemia, DM  are also affecting patient's functional outcome.   REHAB POTENTIAL: Good  CLINICAL DECISION MAKING: Stable/uncomplicated  EVALUATION COMPLEXITY: Moderate  PLAN:  PT FREQUENCY: 1-2x/week  PT DURATION: 12 weeks  PLANNED INTERVENTIONS: Therapeutic exercises, Therapeutic activity, Neuromuscular re-education, Balance training, Gait training, Patient/Family education, Self Care, Joint mobilization, Joint manipulation, Stair training, Vestibular training, Canalith repositioning, DME instructions, Dry Needling, Spinal manipulation, Spinal mobilization, Cryotherapy, Moist heat, Manual therapy, and Re-evaluation  PLAN FOR NEXT SESSION: *** Instruct in LE strengthening and balance, review/progress HEP, progress note     Randon Goldsmith, Student-PT 11/16/2022, 3:58 PM   This entire session was performed under direct supervision and direction of a licensed therapist/therapist assistant . I have personally read, edited and approve of the note as written.  Precious Bard, PT, DPT Physical Therapist - [place] [facility]  Outpatient Physical Therapy- Main Campus [phone]

## 2022-11-17 ENCOUNTER — Ambulatory Visit: Payer: Medicare Other

## 2022-11-17 DIAGNOSIS — R2681 Unsteadiness on feet: Secondary | ICD-10-CM

## 2022-11-17 DIAGNOSIS — M6281 Muscle weakness (generalized): Secondary | ICD-10-CM

## 2022-11-17 DIAGNOSIS — R262 Difficulty in walking, not elsewhere classified: Secondary | ICD-10-CM

## 2022-11-22 ENCOUNTER — Ambulatory Visit: Payer: Medicare Other

## 2022-11-24 ENCOUNTER — Ambulatory Visit: Payer: Medicare Other | Attending: Family | Admitting: Physical Therapy

## 2022-11-24 DIAGNOSIS — M5416 Radiculopathy, lumbar region: Secondary | ICD-10-CM | POA: Diagnosis not present

## 2022-11-24 DIAGNOSIS — R2681 Unsteadiness on feet: Secondary | ICD-10-CM | POA: Insufficient documentation

## 2022-11-24 DIAGNOSIS — M5459 Other low back pain: Secondary | ICD-10-CM | POA: Insufficient documentation

## 2022-11-24 DIAGNOSIS — M6281 Muscle weakness (generalized): Secondary | ICD-10-CM | POA: Diagnosis not present

## 2022-11-24 DIAGNOSIS — M5431 Sciatica, right side: Secondary | ICD-10-CM | POA: Insufficient documentation

## 2022-11-24 DIAGNOSIS — R262 Difficulty in walking, not elsewhere classified: Secondary | ICD-10-CM | POA: Insufficient documentation

## 2022-11-24 NOTE — Therapy (Signed)
OUTPATIENT PHYSICAL THERAPY NEURO TREATMENT   Patient Name: Jennifer [name] MRN: [id number] DOB:02/22/1950, 72 y.o., female Today's Date: 11/24/2022   PCP: [name], FNP REFERRING PROVIDER: [name], FNP  END OF SESSION:  PT End of Session - 11/24/22 0933     Visit Number 11    Number of Visits 24    Date for PT Re-Evaluation 12/08/22    PT Start Time 0934    PT Stop Time 1015    PT Time Calculation (min) 41 min    Equipment Utilized During Treatment Gait belt    Activity Tolerance Patient tolerated treatment well                    Past Medical History:  Diagnosis Date   Anemia    Anxiety    Bariatric surgery status    Complication of anesthesia    Diificulty breathing for about 15 minutes after bariatric surgery   Constipation    COVID-19    Dysrhythmia    Elevated liver enzymes    GERD (gastroesophageal reflux disease)    Hemorrhoids    Herpes genitalis    High cholesterol    Hyperlipidemia    Hypertension    Hypothyroidism    IDA (iron deficiency anemia) 02/09/2021   Neuropathy    Osteoarthritis    Sleep apnea    CPAP   Past Surgical History:  Procedure Laterality Date   ABDOMINAL HYSTERECTOMY     total for fibroids no h/o abnormal pap   bariatric sleeve  2015   BREAST EXCISIONAL BIOPSY Left 1998   carpal tunnel repair     CATARACT EXTRACTION W/PHACO Right 11/01/2021   Procedure: CATARACT EXTRACTION PHACO AND INTRAOCULAR LENS PLACEMENT (IOC) RIGHT;  Surgeon: Nevada Crane, MD;  Location: [facility];  Service: Ophthalmology;  Laterality: Right;  sleep apnea 4.95 00:49.7   CATARACT EXTRACTION W/PHACO Left 11/15/2021   Procedure: CATARACT EXTRACTION PHACO AND INTRAOCULAR LENS PLACEMENT (IOC) LEFT 4.94 00:32.3;  Surgeon: Nevada Crane, MD;  Location: [facility];  Service: Ophthalmology;  Laterality: Left;  sleep apnea   COLONOSCOPY WITH PROPOFOL N/A 02/11/2016   Procedure: COLONOSCOPY WITH PROPOFOL;   Surgeon: Wyline Mood, MD;  Location: [facility] ENDOSCOPY;  Service: Endoscopy;  Laterality: N/A;   COLONOSCOPY WITH PROPOFOL N/A 10/01/2019   Procedure: COLONOSCOPY WITH PROPOFOL;  Surgeon: Wyline Mood, MD;  Location: [facility] ENDOSCOPY;  Service: Gastroenterology;  Laterality: N/A;   COLONOSCOPY WITH PROPOFOL N/A 11/19/2020   Procedure: COLONOSCOPY WITH PROPOFOL;  Surgeon: Wyline Mood, MD;  Location: [facility] ENDOSCOPY;  Service: Gastroenterology;  Laterality: N/A;   COLONOSCOPY WITH PROPOFOL N/A 05/05/2022   Procedure: COLONOSCOPY WITH PROPOFOL;  Surgeon: Regis Bill, MD;  Location: [facility] ENDOSCOPY;  Service: Endoscopy;  Laterality: N/A;   ESOPHAGOGASTRODUODENOSCOPY (EGD) WITH PROPOFOL N/A 12/02/2019   Procedure: ESOPHAGOGASTRODUODENOSCOPY (EGD) WITH PROPOFOL;  Surgeon: Wyline Mood, MD;  Location: [facility] ENDOSCOPY;  Service: Gastroenterology;  Laterality: N/A;   ESOPHAGOGASTRODUODENOSCOPY (EGD) WITH PROPOFOL N/A 11/19/2020   Procedure: ESOPHAGOGASTRODUODENOSCOPY (EGD) WITH PROPOFOL;  Surgeon: Wyline Mood, MD;  Location: [facility] ENDOSCOPY;  Service: Gastroenterology;  Laterality: N/A;   ESOPHAGOGASTRODUODENOSCOPY (EGD) WITH PROPOFOL N/A 05/05/2022   Procedure: ESOPHAGOGASTRODUODENOSCOPY (EGD) WITH PROPOFOL;  Surgeon: Regis Bill, MD;  Location: [facility] ENDOSCOPY;  Service: Endoscopy;  Laterality: N/A;   FLEXIBLE SIGMOIDOSCOPY N/A 02/06/2021   Procedure: FLEXIBLE SIGMOIDOSCOPY;  Surgeon: Jaynie Collins, DO;  Location: [facility] ENDOSCOPY;  Service: Gastroenterology;  Laterality: N/A;   GIVENS CAPSULE STUDY N/A  06/07/2021   Procedure: GIVENS CAPSULE STUDY;  Surgeon: Toney Reil, MD;  Location: [facility] ENDOSCOPY;  Service: Gastroenterology;  Laterality: N/A;   GIVENS CAPSULE STUDY N/A 06/09/2021   Procedure: GIVENS CAPSULE STUDY;  Surgeon: Toney Reil, MD;  Location: [facility] ENDOSCOPY;  Service: Gastroenterology;  Laterality: N/A;   HEMORRHOID SURGERY     LAPAROSCOPIC GASTRIC RESTRICTIVE DUODENAL  PROCEDURE (DUODENAL SWITCH) Bilateral    2020   Patient Active Problem List   Diagnosis Date Noted   Friction blister 07/22/2022   Acute on chronic anemia 05/03/2022   Melena 05/03/2022   Alcohol use disorder 05/03/2022   Transaminitis 11/22/2021   Tinea pedis 08/31/2021   Upper GI bleed 06/06/2021   Eye discharge 02/16/2021   IDA (iron deficiency anemia) 02/09/2021   Acute blood loss anemia 02/06/2021   Elevated liver function tests    Bright red blood per rectum 02/05/2021   Open wound of left heel 01/20/2021   Anxiety and depression 11/25/2020   CPAP (continuous positive airway pressure) dependence 11/25/2020   Sinusitis 11/25/2020   Allergic conjunctivitis of both eyes and rhinitis 11/25/2020   GI bleed 11/17/2020   Chronic iron deficiency anemia    Symptomatic anemia 11/11/2020   Postop check 08/07/2020   Biliary sludge determined by ultrasound 07/02/2020   Right upper quadrant abdominal pain 06/10/2020   Right-sided thoracic back pain 05/18/2020   Muscle spasm 05/18/2020   Bilateral tinnitus 04/30/2020   Irregular heartbeat 02/25/2020   Diabetes mellitus without complication (HCC) 02/25/2020   Strain of right knee 02/10/2020   Aspirin long-term use 10/22/2019   Wound ballistics 08/29/2019   Displacement of lumbar intervertebral disc without myelopathy 08/29/2019   Helicobacter pylori gastrointestinal tract infection 08/29/2019   Hypercholesterolemia 08/29/2019   Phlebitis after infusion 06/26/2019   Superficial thrombophlebitis of left upper extremity 06/26/2019   BMI 38.0-38.9,adult 06/19/2019   Small bowel obstruction (HCC) 05/25/2019   Morbid obesity (HCC) 05/16/2019   Cervical pain (neck) 04/09/2019   Pedal edema 03/06/2019   Pre-operative clearance 03/06/2019   History of sleeve gastrectomy 01/23/2019   Chronic pain syndrome 10/03/2018   Obstructive sleep apnea 10/03/2018   Right shoulder pain 06/08/2018   Chronic venous insufficiency 05/24/2018    Lymphedema 05/24/2018   Lumbar spondylosis 05/17/2018   Leg swelling 05/09/2018   Neuropathy 01/24/2018   Insomnia 07/12/2017   Fracture of phalanx of finger 05/31/2017   Impingement syndrome of shoulder region 05/31/2017   Hemorrhoids 12/15/2016   Hypothyroidism 10/03/2016   Chronic shoulder bursitis 08/30/2016   Positive ANA (antinuclear antibody) 08/30/2016   Bradycardia 06/16/2016   History of adenomatous polyp of colon 05/02/2016   Elevated liver enzymes 04/06/2016   OSA (obstructive sleep apnea) 04/06/2016   Bilateral shoulder pain 01/13/2016   Fatty liver 12/08/2015   Arthritis 12/08/2015   History of bariatric surgery 12/07/2015   Genital herpes 11/11/2015   Hyperlipidemia 11/11/2015   Essential hypertension 11/11/2015   Routine physical examination 11/11/2015   Allergic rhinitis 11/11/2015   GERD (gastroesophageal reflux disease) 11/11/2015   Herpesviral infection, unspecified 01/08/2014   Disorder of thyroid, unspecified 01/08/2014   Lumbar radiculitis 11/19/2013   Neuritis or radiculitis due to rupture of lumbar intervertebral disc 11/19/2013   Monilial vaginitis 08/20/2013   Gastritis and duodenitis 07/25/2013   Impaired fasting glucose 07/17/2012   Obesity, unspecified 07/17/2012   Obesity 07/17/2012   Depression 03/15/2012   Abnormal electrocardiogram (ECG) (EKG) 03/06/2012   URI (upper respiratory infection) 01/16/2012    ONSET DATE:  1 and 1/2 years ago  REFERRING DIAG: G62.9 (ICD-10-CM) - Neuropathy   THERAPY DIAG:  Difficulty in walking, not elsewhere classified  Muscle weakness (generalized)  Unsteadiness on feet  Other low back pain  Radiculopathy, lumbar region  Sciatica, right side  Rationale for Evaluation and Treatment: Rehabilitation  SUBJECTIVE:                                                                                                                                                                                              SUBJECTIVE STATEMENT:  Patient reports she is having a good day. States that RLE is feeling better than prior PT treatment and also states that LLE was experience sharp pain on the sole of the LLE, but that has subsided. Reports that she switched back to insole provided by foot MD, which helped the pain.      Pt accompanied by: self  PERTINENT HISTORY: Peggyann Zwiefelhofer is a 72 year old female with past medical History of iron deficiency anemia (receiving iron infusions and recent right heel ulcer (healed per chart) and extensive medical history as reported in above section. Paitent was seen by PCP and diagnosed with gait/balance impairments.   PAIN:  Are you having pain? Yes: NPRS scale: 4/10 Pain location: bilateral lower legs into feet Pain description: Burning, tingling, numbness, cold Aggravating factors: increased standing > 15 min- worse pain at night Relieving factors: Gabapentin/tylenol, heating pad  PRECAUTIONS: Fall  RED FLAGS: None   WEIGHT BEARING RESTRICTIONS: No  FALLS: Has patient fallen in last 6 months? No  LIVING ENVIRONMENT: Lives with: lives with their spouse Lives in: House/apartment Stairs: Yes: External: 3-4 steps; on left going up Has following equipment at home: Single point cane and Grab bars  PLOF: Independent  PATIENT GOALS: Improve my balance.   OBJECTIVE:   DIAGNOSTIC FINDINGS: None avaible in chart  COGNITION: Overall cognitive status: Within functional limits for tasks assessed   SENSATION: Light touch: Impaired - Bilateral LE  COORDINATION: Slow/delay reaction  EDEMA:  None observed   POSTURE: forward head  LOWER EXTREMITY ROM:      LOWER EXTREMITY MMT:    MMT Right Eval Left Eval  Hip flexion 4- 4-  Hip extension 4- 4-  Hip abduction 4- 4-  Hip adduction 4 4  Hip internal rotation 3+ 3+  Hip external rotation 3+ 3+  Knee flexion 4 4  Knee extension 4 4  Ankle dorsiflexion 4 4  Ankle plantarflexion    Ankle  inversion    Ankle eversion    (Blank rows = not tested)  TRANSFERS: Assistive device utilized: None  Sit to stand: Complete Independence Stand to sit: Complete Independence Chair to chair: Complete Independence Floor:  Not tested    GAIT: Gait pattern: decreased step length- Right, decreased step length- Left, and shuffling unsteady Distance walked: 120 feet Assistive device utilized: None Level of assistance: CGA Comments: unsteady intermittent  FUNCTIONAL TESTS:  5 times sit to stand: 27.61 sec without UE support  Timed up and go (TUG): 18.33 sec 10 meter walk test: 0.66 m/s Berg Balance Scale: 42/56  PATIENT SURVEYS:  FOTO 40  TODAY'S TREATMENT:                                                                                                                              DATE: 11/24/22    Patient demonstrates increased fall risk as noted by score of 19/30 on  Functional Gait Assessment.   <22/30 = predictive of falls, <20/30 = fall in 6 months, <18/30 = predictive of falls in PD MCID: 5 points stroke population, 4 points geriatric population (ANPTA Core Set of Outcome Measures for Adults with Neurologic Conditions, 2018)   Standing at rail: Hip abduction x 10 bil  HS curl x 10 bil  Standing heel raise x 8 Partial squat x 10  Tandem stance 15 sec hold x 2 bil   Side stepping R and L 42ft x 3  Forward/reverse 60ft x 3 each Seated ankle DF x 15   Unless otherwise stated, CGA was provided and gait belt donned in order to ensure pt safety  Pt tolerated treatment well with min cues for posture and full ROM with HEP advancement.    PATIENT EDUCATION: Education details: Purpose of PT/plan of care. Pt educated throughout session about proper posture and technique with exercises. Improved exercise technique, movement at target joints, use of target muscles after min to mod verbal, visual, tactile cues.  Person educated: Patient Education method:  Explanation Education comprehension: verbalized understanding  HOME EXERCISE PROGRAM:  *Issued RTB for home use Access Code: [email or web address] URL: [email or web address] Date: 09/20/2022 Prepared by: Maureen Ralphs  Exercises - Seated Hip Flexion March with Ankle Weights  - 1 x daily - 3 x weekly - 3 sets - 10 reps - Seated Long Arc Quad with Ankle Weight  - 1 x daily - 3 x weekly - 3 sets - 10 reps - Seated Hip Flexion and External Rotation  - 1 x daily - 3 x weekly - 3 sets - 10 reps - Seated Hip Internal Rotation with Resistance  - 1 x daily - 3 x weekly - 3 sets - 10 reps - Seated Hamstring Curl with Anchored Resistance  - 1 x daily - 3 x weekly - 3 sets - 10 reps - Sit to Stand with Arms Crossed  - 1 x daily - 3 x weekly - 3 sets - 10 reps - Standing Hip Abduction with Counter Support  - 1 x daily - 7 x weekly - 3 sets -  10 reps - 2 hold - Standing Knee Flexion with Counter Support  - 1 x daily - 7 x weekly - 3 sets - 10 reps - 2 hold - Standing Tandem Balance with Counter Support  - 1 x daily - 7 x weekly - 3 sets - 4 reps - 15 hold - Side Stepping with Counter Support  - 1 x daily - 7 x weekly - 3 sets - 10 reps - Heel Toe Raises with Counter Support  - 1 x daily - 7 x weekly - 3 sets - 10 reps  GOALS: Goals reviewed with patient? Yes  SHORT TERM GOALS: Target date: 10/27/2022  Pt will be independent with HEP in order to improve strength and balance in order to decrease fall risk and improve function at home and work.  Baseline: EVAL- Patient has no formal HEP in place, 10/31: pt compliant with HEP Goal status: MET   LONG TERM GOALS: Target date: 12/08/2022   1.  Patient (> 36 years old) will complete five times sit to stand test in < 10 seconds indicating an increased LE strength and improved balance.  Baseline: EVAL= 27.61 sec 10/31: 13.8 sec Goal status: MET, REVISED  2.  Patient will increase FOTO score to equal to or greater than  54   to demonstrate  statistically significant improvement in mobility and quality of life.  Baseline: EVAL= 40 10/31: 59.7819 Goal status: MET   3.  Patient will increase Berg Balance score by > 6 points to demonstrate decreased fall risk during functional activities. Baseline: EVAL: 42/56 10/31: 51/56 Goal status: MET   4.  Patient will reduce timed up and go to <13 seconds to reduce fall risk and demonstrate improved transfer/gait ability. Baseline: EVAL= 18.33 sec 10/31: 14.61s without cane 14.41s with cane Goal status: IN PROGRESS  5.  Patient will increase 10 meter walk test to >1.24m/s as to improve gait speed for better community ambulation and to reduce fall risk. Baseline: EVAL= 0.66 m/s 10/31: 1.06 m/s with cane, 1.12 m/s without cane  Goal status: MET  6. Patient will increase Functional Gait Assessment score to >20/30 as to reduce fall risk and improve dynamic gait safety with community ambulation. Baseline: 19 Goal status: NEW  ASSESSMENT:  CLINICAL IMPRESSION:  Pt presented to PT treatment motivated to participate. Increased fall risk noted with score of 19/30 on FGA. HEP advance with modified [place] level strengthening exercises. Most difficulty with tandem stance positions.  Pt will continue to benefit from skilled physical therapy intervention to address impairments, improve QOL, and attain therapy goals.      OBJECTIVE IMPAIRMENTS: Abnormal gait, decreased activity tolerance, decreased balance, decreased coordination, decreased endurance, decreased mobility, difficulty walking, decreased strength, impaired sensation, and pain.   ACTIVITY LIMITATIONS: carrying, lifting, bending, standing, squatting, stairs, transfers, and bathing  PARTICIPATION LIMITATIONS: cleaning, laundry, shopping, community activity, and yard work  PERSONAL FACTORS: 3+ comorbidities: HTN, anemia, DM  are also affecting patient's functional outcome.   REHAB POTENTIAL: Good  CLINICAL DECISION MAKING:  Stable/uncomplicated  EVALUATION COMPLEXITY: Moderate  PLAN:  PT FREQUENCY: 1-2x/week  PT DURATION: 12 weeks  PLANNED INTERVENTIONS: Therapeutic exercises, Therapeutic activity, Neuromuscular re-education, Balance training, Gait training, Patient/Family education, Self Care, Joint mobilization, Joint manipulation, Stair training, Vestibular training, Canalith repositioning, DME instructions, Dry Needling, Spinal manipulation, Spinal mobilization, Cryotherapy, Moist heat, Manual therapy, and Re-evaluation  PLAN FOR NEXT SESSION:    Instruct in LE strengthening and balance, review/progress HEP,    Grier Rocher PT,  DPT  Physical Therapist - [facility]  [facility]  9:38 AM 11/24/22

## 2022-11-25 ENCOUNTER — Telehealth: Payer: Self-pay

## 2022-11-25 ENCOUNTER — Ambulatory Visit (INDEPENDENT_AMBULATORY_CARE_PROVIDER_SITE_OTHER): Payer: Medicare Other | Admitting: Family

## 2022-11-25 ENCOUNTER — Encounter: Payer: Self-pay | Admitting: Family

## 2022-11-25 VITALS — BP 118/78 | HR 64 | Temp 98.0°F | Ht 61.0 in | Wt 173.2 lb

## 2022-11-25 DIAGNOSIS — I10 Essential (primary) hypertension: Secondary | ICD-10-CM | POA: Diagnosis not present

## 2022-11-25 DIAGNOSIS — G8929 Other chronic pain: Secondary | ICD-10-CM | POA: Diagnosis not present

## 2022-11-25 DIAGNOSIS — J309 Allergic rhinitis, unspecified: Secondary | ICD-10-CM | POA: Diagnosis not present

## 2022-11-25 DIAGNOSIS — K219 Gastro-esophageal reflux disease without esophagitis: Secondary | ICD-10-CM

## 2022-11-25 DIAGNOSIS — G629 Polyneuropathy, unspecified: Secondary | ICD-10-CM | POA: Diagnosis not present

## 2022-11-25 DIAGNOSIS — E138 Other specified diabetes mellitus with unspecified complications: Secondary | ICD-10-CM | POA: Diagnosis not present

## 2022-11-25 DIAGNOSIS — Z87898 Personal history of other specified conditions: Secondary | ICD-10-CM | POA: Diagnosis not present

## 2022-11-25 DIAGNOSIS — M546 Pain in thoracic spine: Secondary | ICD-10-CM | POA: Diagnosis not present

## 2022-11-25 LAB — BASIC METABOLIC PANEL
BUN: 19 mg/dL (ref 6–23)
CO2: 31 meq/L (ref 19–32)
Calcium: 9.2 mg/dL (ref 8.4–10.5)
Chloride: 97 meq/L (ref 96–112)
Creatinine, Ser: 0.94 mg/dL (ref 0.40–1.20)
GFR: 60.29 mL/min (ref >60.00)
Glucose, Bld: 86 mg/dL (ref 70–99)
Potassium: 3.7 meq/L (ref 3.5–5.1)
Sodium: 134 meq/L — ABNORMAL LOW (ref 135–145)

## 2022-11-25 LAB — HEMOGLOBIN A1C: Hgb A1c MFr Bld: 5.8 % (ref 4.6–6.5)

## 2022-11-25 MED ORDER — FAMOTIDINE 20 MG PO TABS: 20.0000 mg | ORAL_TABLET | Freq: Every evening | ORAL | 1 refills | Status: DC | PRN

## 2022-11-25 MED ORDER — GABAPENTIN 300 MG PO CAPS: 900.0000 mg | ORAL_CAPSULE | Freq: Two times a day (BID) | ORAL | 3 refills | Status: DC

## 2022-11-25 MED ORDER — GABAPENTIN 100 MG PO CAPS: ORAL_CAPSULE | ORAL | 3 refills | Status: DC

## 2022-11-25 MED ORDER — AZELASTINE HCL 0.1 % NA SOLN
1.0000 | Freq: Two times a day (BID) | NASAL | Status: DC
Start: 2022-11-25 — End: 2022-12-20

## 2022-11-25 NOTE — Patient Instructions (Addendum)
NEW PLAN FOR GABAPENTIN:  Please monitor for excessive sedation  Take gabapentin 100mg  with morning 900mg , take 100mg  at lunch and  take gabapentin100mg  evening dose WITH gabapentin 900mg .  Please try over the counter capsaicin for nerve pain ; you apply during the day.   We will discuss Lyrica at follow up if not effective.   Start  zyrtec daily; be consistent with daily nasal spray azelastine ( this is also an anti histamine).   If no improvement , please let me know and I will order CT sinus.   You may also use as needed pepcid ac ( I have prescribed) at night for breakthrough acid reflux.

## 2022-11-25 NOTE — Telephone Encounter (Signed)
Spoke to receptionist at [facility] and she stated that they could not release the notes to our office unless we were the referring physician

## 2022-11-25 NOTE — Assessment & Plan Note (Signed)
Acute on chronic.  Recently treated with doxycycline.  Requesting office visit notes from [facility].  Advised consistency with Zyrtec daily as well as azelastine daily.  If symptoms do not resolve with Zyrtec, azelastine, we discussed pursuing CT sinus

## 2022-11-25 NOTE — Progress Notes (Signed)
Assessment & Plan:  Allergic rhinitis, unspecified seasonality, unspecified trigger Assessment & Plan: Acute on chronic.  Recently treated with doxycycline.  Requesting office visit notes from [facility].  Advised consistency with Zyrtec daily as well as azelastine daily.  If symptoms do not resolve with Zyrtec, azelastine, we discussed pursuing CT sinus  Orders: -     Azelastine HCl; Place 1 spray into both nostrils 2 (two) times daily. Use in each nostril as directed  Essential hypertension -     Basic metabolic panel  Chronic right-sided thoracic back pain -     Gabapentin; Take 100mg  with morning 900mg , take 100mg  at lunch and and 100mg  evening dose gabapentin 900mg .  Dispense: 180 capsule; Refill: 3 -     Gabapentin; Take 3 capsules (900 mg total) by mouth 2 (two) times daily.  Dispense: 180 capsule; Refill: 3  Gastroesophageal reflux disease, unspecified whether esophagitis present -     Famotidine; Take 1 tablet (20 mg total) by mouth at bedtime as needed for heartburn or indigestion.  Dispense: 90 tablet; Refill: 1  History of prediabetes -     Hemoglobin A1c  Other specified diabetes mellitus with unspecified complications (HCC) -     Hemoglobin A1c  Neuropathy Assessment & Plan: Suboptimal control.  Increase gabapentin to include midday dose Gabapentin 1000mg  qam, 100mg  at lunch , 1000mg  qpm.  Advised over the counter capsaicin . Monitor for excessive sedation.  Discuss Lyrica if gabapentin increase is not effective      Return precautions given.   Risks, benefits, and alternatives of the medications and treatment plan prescribed today were discussed, and patient expressed understanding.   Education regarding symptom management and diagnosis given to patient on AVS either electronically or printed.  Return in about 1 month (around 12/25/2022).  [name], FNP  Subjective:    Patient ID: Jennifer [name], female    DOB: Feb 18, 1950, 72 y.o.   MRN:  [id number]  CC: Jennifer [name] is a 72 y.o. female who presents today for follow up.   HPI: Complains of continued clear nasal congestion x 2 months  Endorses PND.   No fever, facial pain, ear pain, fever, sob, cp  She has taken claritin twice this week.  She is using azelastine prn and Flonase every day.   Compliant with omeprazole 40mg  every day with over all good control. Some breakthrough symptoms.   Compliant with losartan 100mg  , triamterene hydrochlorothiazide 37.5-25 mg, potassium chloride 20 meq every day, demadex 20mg  every day  Compliant with gabapentin 1000mg  BID.  Breakthrough neuropathy and pain midday.  She doesn't feel excessively sedated.   Treated with prednisone 10/10/22 Went to [facility] 9/113/24 and treated with doxycycline 100mg  BID 7 days She reports seeing Dr Andee Poles ( no record of this in Epic) and states treated with prednisone. She states that he prescribed azelastine and flonase prn.   GFR 53   Allergies: Bee pollen, Celecoxib, Hydrocodone, Pollen extract, Sodium ferric gluconate [ferrous gluconate], Nasacort [triamcinolone], and Vicodin [hydrocodone-acetaminophen] Current Outpatient Medications on File Prior to Visit  Medication Sig Dispense Refill   acetaminophen (TYLENOL) 325 MG tablet Take 2 tablets (650 mg total) by mouth every 6 (six) hours as needed for mild pain (or Fever >/= 101).     acyclovir (ZOVIRAX) 400 MG tablet Take 1 tablet (400 mg total) by mouth daily. 30 tablet 5   FLUoxetine (PROZAC) 20 MG capsule Take 20 mg by mouth daily.     fluticasone (FLONASE) 50 MCG/ACT nasal spray  INSTILL 1 SPRAY INTO BOTH NOSTRILS DAILY 48 mL 0   folic acid (FOLVITE) 1 MG tablet Take 1 tablet (1 mg total) by mouth daily. 360 tablet 0   hydrocortisone (ANUSOL-HC) 25 MG suppository Unwrap and insert 1 suppository rectally twice a day 12 suppository 1   levothyroxine (SYNTHROID) 50 MCG tablet Take 1 tablet (50 mcg total) by mouth daily. 90 tablet 3   losartan  (COZAAR) 100 MG tablet TAKE 1 TABLET BY MOUTH EVERYDAY AT BEDTIME (Patient taking differently: Take 100 mg by mouth daily. TAKE 1 TABLET BY MOUTH EVERYDAY AT BEDTIME) 90 tablet 3   Multiple Vitamin (MULTIVITAMIN WITH MINERALS) TABS tablet Take 1 tablet by mouth daily.     naltrexone (DEPADE) 50 MG tablet Take by mouth daily. Patient takes  one  50 mg tablet daily at the same time     omeprazole (PRILOSEC) 40 MG capsule Take 40 mg by mouth daily.     potassium chloride SA (KLOR-CON M) 20 MEQ tablet Take 1 tablet (20 mEq total) by mouth daily.     rosuvastatin (CRESTOR) 5 MG tablet Take by mouth.     sodium chloride (OCEAN) 0.65 % SOLN nasal spray Place 2 sprays into both nostrils as needed for congestion. 30 mL 2   torsemide (DEMADEX) 20 MG tablet Take 1 tablet (20 mg total) by mouth daily.     traZODone (DESYREL) 50 MG tablet TAKE 1 TABLET BY MOUTH EVERY DAY FOR SLEEP 90 tablet 3   triamterene-hydrochlorothiazide (MAXZIDE-25) 37.5-25 MG tablet Take 1 tablet by mouth daily.     No current facility-administered medications on file prior to visit.    Review of Systems  Constitutional:  Negative for chills, fever and unexpected weight change.  HENT:  Positive for congestion, postnasal drip and rhinorrhea.   Respiratory:  Negative for cough.   Cardiovascular:  Negative for chest pain, palpitations and leg swelling.  Gastrointestinal:  Negative for nausea and vomiting.  Musculoskeletal:  Negative for arthralgias and myalgias.  Skin:  Negative for rash.  Neurological:  Positive for numbness. Negative for headaches.  Hematological:  Negative for adenopathy.  Psychiatric/Behavioral:  Negative for confusion.       Objective:    BP 118/78   Pulse 64   Temp 98 F (36.7 C) (Oral)   Ht 5\' 1"  (1.549 m)   Wt 173 lb 3.2 oz (78.6 kg)   SpO2 99%   BMI 32.73 kg/m  BP Readings from Last 3 Encounters:  11/25/22 118/78  11/01/22 137/73  10/05/22 110/60   Wt Readings from Last 3 Encounters:   11/25/22 173 lb 3.2 oz (78.6 kg)  11/01/22 177 lb 12.8 oz (80.6 kg)  10/10/22 170 lb (77.1 kg)    Physical Exam Vitals reviewed.  Constitutional:      Appearance: She is well-developed.  HENT:     Head: Normocephalic and atraumatic.     Right Ear: Hearing, tympanic membrane, ear canal and external ear normal. No decreased hearing noted. No drainage, swelling or tenderness. No middle ear effusion. No foreign body. Tympanic membrane is not erythematous or bulging.     Left Ear: Hearing, tympanic membrane, ear canal and external ear normal. No decreased hearing noted. No drainage, swelling or tenderness.  No middle ear effusion. No foreign body. Tympanic membrane is not erythematous or bulging.     Nose: Nose normal. No rhinorrhea.     Right Sinus: No maxillary sinus tenderness or frontal sinus tenderness.     Left Sinus:  No maxillary sinus tenderness or frontal sinus tenderness.     Mouth/Throat:     Pharynx: Uvula midline. No oropharyngeal exudate or posterior oropharyngeal erythema.     Tonsils: No tonsillar abscesses.  Eyes:     Conjunctiva/sclera: Conjunctivae normal.  Cardiovascular:     Rate and Rhythm: Regular rhythm.     Pulses: Normal pulses.     Heart sounds: Normal heart sounds.  Pulmonary:     Effort: Pulmonary effort is normal.     Breath sounds: Normal breath sounds. No wheezing, rhonchi or rales.  Lymphadenopathy:     Head:     Right side of head: No submental, submandibular, tonsillar, preauricular, posterior auricular or occipital adenopathy.     Left side of head: No submental, submandibular, tonsillar, preauricular, posterior auricular or occipital adenopathy.     Cervical: No cervical adenopathy.  Skin:    General: Skin is warm and dry.  Neurological:     Mental Status: She is alert.  Psychiatric:        Speech: Speech normal.        Behavior: Behavior normal.        Thought Content: Thought content normal.

## 2022-11-25 NOTE — Assessment & Plan Note (Signed)
Suboptimal control.  Increase gabapentin to include midday dose Gabapentin 1000mg  qam, 100mg  at lunch , 1000mg  qpm.  Advised over the counter capsaicin . Monitor for excessive sedation.  Discuss Lyrica if gabapentin increase is not effective

## 2022-11-28 NOTE — Therapy (Signed)
OUTPATIENT PHYSICAL THERAPY NEURO TREATMENT   Patient Name: Jennifer [name] MRN: [id number] DOB:Apr 29, 1950, 72 y.o., female Today's Date: 11/29/2022   PCP: [name], FNP REFERRING PROVIDER: [name], FNP  END OF SESSION:  PT End of Session - 11/29/22 1013     Visit Number 12    Number of Visits 24    Date for PT Re-Evaluation 12/08/22    PT Start Time 0930    PT Stop Time 1013    PT Time Calculation (min) 43 min    Equipment Utilized During Treatment Gait belt    Activity Tolerance Patient tolerated treatment well    Behavior During Therapy WFL for tasks assessed/performed                     Past Medical History:  Diagnosis Date   Anemia    Anxiety    Bariatric surgery status    Complication of anesthesia    Diificulty breathing for about 15 minutes after bariatric surgery   Constipation    COVID-19    Dysrhythmia    Elevated liver enzymes    GERD (gastroesophageal reflux disease)    Hemorrhoids    Herpes genitalis    High cholesterol    Hyperlipidemia    Hypertension    Hypothyroidism    IDA (iron deficiency anemia) 02/09/2021   Neuropathy    Osteoarthritis    Sleep apnea    CPAP   Past Surgical History:  Procedure Laterality Date   ABDOMINAL HYSTERECTOMY     total for fibroids no h/o abnormal pap   bariatric sleeve  2015   BREAST EXCISIONAL BIOPSY Left 1998   carpal tunnel repair     CATARACT EXTRACTION W/PHACO Right 11/01/2021   Procedure: CATARACT EXTRACTION PHACO AND INTRAOCULAR LENS PLACEMENT (IOC) RIGHT;  Surgeon: Nevada Crane, MD;  Location: [facility];  Service: Ophthalmology;  Laterality: Right;  sleep apnea 4.95 00:49.7   CATARACT EXTRACTION W/PHACO Left 11/15/2021   Procedure: CATARACT EXTRACTION PHACO AND INTRAOCULAR LENS PLACEMENT (IOC) LEFT 4.94 00:32.3;  Surgeon: Nevada Crane, MD;  Location: [facility];  Service: Ophthalmology;  Laterality: Left;  sleep apnea   COLONOSCOPY WITH  PROPOFOL N/A 02/11/2016   Procedure: COLONOSCOPY WITH PROPOFOL;  Surgeon: Wyline Mood, MD;  Location: [facility] ENDOSCOPY;  Service: Endoscopy;  Laterality: N/A;   COLONOSCOPY WITH PROPOFOL N/A 10/01/2019   Procedure: COLONOSCOPY WITH PROPOFOL;  Surgeon: Wyline Mood, MD;  Location: [facility]COPY;  Service: Gastroenterology;  Laterality: N/A;   COLONOSCOPY WITH PROPOFOL N/A 11/19/2020   Procedure: COLONOSCOPY WITH PROPOFOL;  Surgeon: Wyline Mood, MD;  Location: [facility] ENDOSCOPY;  Service: Gastroenterology;  Laterality: N/A;   COLONOSCOPY WITH PROPOFOL N/A 05/05/2022   Procedure: COLONOSCOPY WITH PROPOFOL;  Surgeon: Regis Bill, MD;  Location: [facility] ENDOSCOPY;  Service: Endoscopy;  Laterality: N/A;   ESOPHAGOGASTRODUODENOSCOPY (EGD) WITH PROPOFOL N/A 12/02/2019   Procedure: ESOPHAGOGASTRODUODENOSCOPY (EGD) WITH PROPOFOL;  Surgeon: Wyline Mood, MD;  Location: [facility] ENDOSCOPY;  Service: Gastroenterology;  Laterality: N/A;   ESOPHAGOGASTRODUODENOSCOPY (EGD) WITH PROPOFOL N/A 11/19/2020   Procedure: ESOPHAGOGASTRODUODENOSCOPY (EGD) WITH PROPOFOL;  Surgeon: Wyline Mood, MD;  Location: [facility] ENDOSCOPY;  Service: Gastroenterology;  Laterality: N/A;   ESOPHAGOGASTRODUODENOSCOPY (EGD) WITH PROPOFOL N/A 05/05/2022   Procedure: ESOPHAGOGASTRODUODENOSCOPY (EGD) WITH PROPOFOL;  Surgeon: Regis Bill, MD;  Location: [facility] ENDOSCOPY;  Service: Endoscopy;  Laterality: N/A;   FLEXIBLE SIGMOIDOSCOPY N/A 02/06/2021   Procedure: FLEXIBLE SIGMOIDOSCOPY;  Surgeon: Jaynie Collins, DO;  Location: [facility] ENDOSCOPY;  Service: Gastroenterology;  Laterality: N/A;   GIVENS CAPSULE STUDY N/A 06/07/2021   Procedure: GIVENS CAPSULE STUDY;  Surgeon: Toney Reil, MD;  Location: [facility] ENDOSCOPY;  Service: Gastroenterology;  Laterality: N/A;   GIVENS CAPSULE STUDY N/A 06/09/2021   Procedure: GIVENS CAPSULE STUDY;  Surgeon: Toney Reil, MD;  Location: [facility] ENDOSCOPY;  Service: Gastroenterology;  Laterality: N/A;    HEMORRHOID SURGERY     LAPAROSCOPIC GASTRIC RESTRICTIVE DUODENAL PROCEDURE (DUODENAL SWITCH) Bilateral    2020   Patient Active Problem List   Diagnosis Date Noted   Friction blister 07/22/2022   Acute on chronic anemia 05/03/2022   Melena 05/03/2022   Alcohol use disorder 05/03/2022   Transaminitis 11/22/2021   Tinea pedis 08/31/2021   Upper GI bleed 06/06/2021   Eye discharge 02/16/2021   IDA (iron deficiency anemia) 02/09/2021   Acute blood loss anemia 02/06/2021   Elevated liver function tests    Bright red blood per rectum 02/05/2021   Open wound of left heel 01/20/2021   Anxiety and depression 11/25/2020   CPAP (continuous positive airway pressure) dependence 11/25/2020   Sinusitis 11/25/2020   Allergic conjunctivitis of both eyes and rhinitis 11/25/2020   GI bleed 11/17/2020   Chronic iron deficiency anemia    Symptomatic anemia 11/11/2020   Postop check 08/07/2020   Biliary sludge determined by ultrasound 07/02/2020   Right upper quadrant abdominal pain 06/10/2020   Right-sided thoracic back pain 05/18/2020   Muscle spasm 05/18/2020   Bilateral tinnitus 04/30/2020   Irregular heartbeat 02/25/2020   Diabetes mellitus without complication (HCC) 02/25/2020   Strain of right knee 02/10/2020   Aspirin long-term use 10/22/2019   Wound ballistics 08/29/2019   Displacement of lumbar intervertebral disc without myelopathy 08/29/2019   Helicobacter pylori gastrointestinal tract infection 08/29/2019   Hypercholesterolemia 08/29/2019   Phlebitis after infusion 06/26/2019   Superficial thrombophlebitis of left upper extremity 06/26/2019   BMI 38.0-38.9,adult 06/19/2019   Small bowel obstruction (HCC) 05/25/2019   Morbid obesity (HCC) 05/16/2019   Cervical pain (neck) 04/09/2019   Pedal edema 03/06/2019   Pre-operative clearance 03/06/2019   History of sleeve gastrectomy 01/23/2019   Chronic pain syndrome 10/03/2018   Obstructive sleep apnea 10/03/2018   Right shoulder  pain 06/08/2018   Chronic venous insufficiency 05/24/2018   Lymphedema 05/24/2018   Lumbar spondylosis 05/17/2018   Leg swelling 05/09/2018   Neuropathy 01/24/2018   Insomnia 07/12/2017   Fracture of phalanx of finger 05/31/2017   Impingement syndrome of shoulder region 05/31/2017   Hemorrhoids 12/15/2016   Hypothyroidism 10/03/2016   Chronic shoulder bursitis 08/30/2016   Positive ANA (antinuclear antibody) 08/30/2016   Bradycardia 06/16/2016   History of adenomatous polyp of colon 05/02/2016   Elevated liver enzymes 04/06/2016   OSA (obstructive sleep apnea) 04/06/2016   Bilateral shoulder pain 01/13/2016   Fatty liver 12/08/2015   Arthritis 12/08/2015   History of bariatric surgery 12/07/2015   Genital herpes 11/11/2015   Hyperlipidemia 11/11/2015   Essential hypertension 11/11/2015   Routine physical examination 11/11/2015   Allergic rhinitis 11/11/2015   GERD (gastroesophageal reflux disease) 11/11/2015   Herpesviral infection, unspecified 01/08/2014   Disorder of thyroid, unspecified 01/08/2014   Lumbar radiculitis 11/19/2013   Neuritis or radiculitis due to rupture of lumbar intervertebral disc 11/19/2013   Monilial vaginitis 08/20/2013   Gastritis and duodenitis 07/25/2013   Impaired fasting glucose 07/17/2012   Obesity, unspecified 07/17/2012   Obesity 07/17/2012   Depression 03/15/2012   Abnormal electrocardiogram (ECG) (EKG) 03/06/2012  URI (upper respiratory infection) 01/16/2012    ONSET DATE: 1 and 1/2 years ago  REFERRING DIAG: G62.9 (ICD-10-CM) - Neuropathy   THERAPY DIAG:  Difficulty in walking, not elsewhere classified  Muscle weakness (generalized)  Unsteadiness on feet  Rationale for Evaluation and Treatment: Rehabilitation  SUBJECTIVE:                                                                                                                                                                                             SUBJECTIVE  STATEMENT:  Pt reports she is doing well today, but her feet still bothering her.  She removed the heel lift from her shoe due to it being uncomfortable.  There have not been any changes to her medications and she reports no falls since last visit. She has a doctor's appointment this afternoon with the GI specialist.    Pt accompanied by: self  PERTINENT HISTORY: Srinidhi Setser is a 72 year old female with past medical History of iron deficiency anemia (receiving iron infusions and recent right heel ulcer (healed per chart) and extensive medical history as reported in above section. Paitent was seen by PCP and diagnosed with gait/balance impairments.   PAIN:  Are you having pain? Yes: NPRS scale: 4/10 Pain location: bilateral lower legs into feet Pain description: Burning, tingling, numbness, cold Aggravating factors: increased standing > 15 min- worse pain at night Relieving factors: Gabapentin/tylenol, heating pad  PRECAUTIONS: Fall  RED FLAGS: None   WEIGHT BEARING RESTRICTIONS: No  FALLS: Has patient fallen in last 6 months? No  LIVING ENVIRONMENT: Lives with: lives with their spouse Lives in: House/apartment Stairs: Yes: External: 3-4 steps; on left going up Has following equipment at home: Single point cane and Grab bars  PLOF: Independent  PATIENT GOALS: Improve my balance.   OBJECTIVE:   DIAGNOSTIC FINDINGS: None avaible in chart  COGNITION: Overall cognitive status: Within functional limits for tasks assessed   SENSATION: Light touch: Impaired - Bilateral LE  COORDINATION: Slow/delay reaction  EDEMA:  None observed   POSTURE: forward head  LOWER EXTREMITY ROM:      LOWER EXTREMITY MMT:    MMT Right Eval Left Eval  Hip flexion 4- 4-  Hip extension 4- 4-  Hip abduction 4- 4-  Hip adduction 4 4  Hip internal rotation 3+ 3+  Hip external rotation 3+ 3+  Knee flexion 4 4  Knee extension 4 4  Ankle dorsiflexion 4 4  Ankle plantarflexion     Ankle inversion    Ankle eversion    (Blank rows = not tested)  TRANSFERS: Assistive device utilized: None  Sit to stand: Complete Independence Stand to sit: Complete Independence Chair to chair: Complete Independence Floor:  Not tested    GAIT: Gait pattern: decreased step length- Right, decreased step length- Left, and shuffling unsteady Distance walked: 120 feet Assistive device utilized: None Level of assistance: CGA Comments: unsteady intermittent  FUNCTIONAL TESTS:  5 times sit to stand: 27.61 sec without UE support  Timed up and go (TUG): 18.33 sec 10 meter walk test: 0.66 m/s Berg Balance Scale: 42/56  PATIENT SURVEYS:  FOTO 40  TODAY'S TREATMENT:                                                                                                                              DATE: 11/29/22  Unless otherwise stated, CGA was provided and gait belt donned in order to ensure pt safety  Therex: -Step ups from Airex onto 6 inch step 2x10 each LE, pt rated it as medium in terms of difficulty -Three Way Hip Hike 1x10 each LE, more difficult on the right LE  -Standing heel raises 2x10  -Standing hamstring curls 2x10 each LE -STS 1x10, no UE support  NMR:  -Blaze pod circle activity- Pods set to random and arranged in circle with pt in the middle. If the pod lights up red, patient must tap it with her right lower extremity. If pod lights up green, patient must tap it with her left lower extremity. Activity focusing on single leg balance, righting reactions, and adding in a cognitive challenge.  x3 rounds, 45 hits total   Standing with CGA next to support surface:  Airex pad: 3x20 marches, alternating LE, last two sets with two sec hold with each march   Obstacle Course created in hallway to work on balance, reactions, and strengthening. First variation of the course involved weaving through cones (narrow base of support), stepping over two hurdles (foot clearance),  foot taps  on hedge hog x10 (single leg balance), and tandem walking. Performed x4 Most difficult portion was the tandem walking, but patient able to perform six consecutive steps during last rep  PATIENT EDUCATION: Education details: Purpose of PT/plan of care. Pt educated throughout session about proper posture and technique with exercises. Improved exercise technique, movement at target joints, use of target muscles after min to mod verbal, visual, tactile cues.  Person educated: Patient Education method: Explanation Education comprehension: verbalized understanding  HOME EXERCISE PROGRAM:  *Issued RTB for home use Access Code: [email or web address] URL: [email or web address] Date: 09/20/2022 Prepared by: Maureen Ralphs  Exercises - Seated Hip Flexion March with Ankle Weights  - 1 x daily - 3 x weekly - 3 sets - 10 reps - Seated Long Arc Quad with Ankle Weight  - 1 x daily - 3 x weekly - 3 sets - 10 reps - Seated Hip Flexion and External Rotation  - 1 x daily - 3 x weekly - 3 sets - 10 reps - Seated Hip Internal Rotation with Resistance  -  1 x daily - 3 x weekly - 3 sets - 10 reps - Seated Hamstring Curl with Anchored Resistance  - 1 x daily - 3 x weekly - 3 sets - 10 reps - Sit to Stand with Arms Crossed  - 1 x daily - 3 x weekly - 3 sets - 10 reps - Standing Hip Abduction with Counter Support  - 1 x daily - 7 x weekly - 3 sets - 10 reps - 2 hold - Standing Knee Flexion with Counter Support  - 1 x daily - 7 x weekly - 3 sets - 10 reps - 2 hold - Standing Tandem Balance with Counter Support  - 1 x daily - 7 x weekly - 3 sets - 4 reps - 15 hold - Side Stepping with Counter Support  - 1 x daily - 7 x weekly - 3 sets - 10 reps - Heel Toe Raises with Counter Support  - 1 x daily - 7 x weekly - 3 sets - 10 reps  GOALS: Goals reviewed with patient? Yes  SHORT TERM GOALS: Target date: 10/27/2022  Pt will be independent with HEP in order to improve strength and balance in order to decrease  fall risk and improve function at home and work.  Baseline: EVAL- Patient has no formal HEP in place, 10/31: pt compliant with HEP Goal status: MET   LONG TERM GOALS: Target date: 12/08/2022   1.  Patient (> 44 years old) will complete five times sit to stand test in < 10 seconds indicating an increased LE strength and improved balance.  Baseline: EVAL= 27.61 sec 10/31: 13.8 sec Goal status: MET, REVISED  2.  Patient will increase FOTO score to equal to or greater than  54   to demonstrate statistically significant improvement in mobility and quality of life.  Baseline: EVAL= 40 10/31: 59.7819 Goal status: MET   3.  Patient will increase Berg Balance score by > 6 points to demonstrate decreased fall risk during functional activities. Baseline: EVAL: 42/56 10/31: 51/56 Goal status: MET   4.  Patient will reduce timed up and go to <13 seconds to reduce fall risk and demonstrate improved transfer/gait ability. Baseline: EVAL= 18.33 sec 10/31: 14.61s without cane 14.41s with cane Goal status: IN PROGRESS  5.  Patient will increase 10 meter walk test to >1.35m/s as to improve gait speed for better community ambulation and to reduce fall risk. Baseline: EVAL= 0.66 m/s 10/31: 1.06 m/s with cane, 1.12 m/s without cane  Goal status: MET  6. Patient will increase Functional Gait Assessment score to >20/30 as to reduce fall risk and improve dynamic gait safety with community ambulation. Baseline: 19 Goal status: NEW  ASSESSMENT:  CLINICAL IMPRESSION:  Pt presents to session motivated and eager to participate. She still presents with impaired balance evidenced by difficulty of step ups from Airex compared to step ups without the Airex. She is able to perform foot taps with decreased difficulty compared to previous sessions indicating her single leg balance is improving. When challenged to perform activities requiring a narrow base of support such as tandem walking there is increased demand to  maintain stability. As the number of repetitions increase, she is able to perform with less instances of LOB. Pt will continue to benefit from skilled physical therapy intervention to address impairments, improve QOL, and attain therapy goals.      OBJECTIVE IMPAIRMENTS: Abnormal gait, decreased activity tolerance, decreased balance, decreased coordination, decreased endurance, decreased mobility, difficulty walking, decreased  strength, impaired sensation, and pain.   ACTIVITY LIMITATIONS: carrying, lifting, bending, standing, squatting, stairs, transfers, and bathing  PARTICIPATION LIMITATIONS: cleaning, laundry, shopping, community activity, and yard work  PERSONAL FACTORS: 3+ comorbidities: HTN, anemia, DM  are also affecting patient's functional outcome.   REHAB POTENTIAL: Good  CLINICAL DECISION MAKING: Stable/uncomplicated  EVALUATION COMPLEXITY: Moderate  PLAN:  PT FREQUENCY: 1-2x/week  PT DURATION: 12 weeks  PLANNED INTERVENTIONS: Therapeutic exercises, Therapeutic activity, Neuromuscular re-education, Balance training, Gait training, Patient/Family education, Self Care, Joint mobilization, Joint manipulation, Stair training, Vestibular training, Canalith repositioning, DME instructions, Dry Needling, Spinal manipulation, Spinal mobilization, Cryotherapy, Moist heat, Manual therapy, and Re-evaluation  PLAN FOR NEXT SESSION:    Instruct in LE strengthening and balance, balance activities incorporating single leg and narrow BOS, dynamic balance activities   Randon Goldsmith, Student-PT  This entire session was performed under direct supervision and direction of a licensed therapist/therapist assistant . I have personally read, edited and approve of the note as written.  Precious Bard, PT, DPT Physical Therapist - [facility]  Outpatient Physical Therapy- Main Campus [phone]     2:55 PM 11/29/22

## 2022-11-29 ENCOUNTER — Ambulatory Visit: Payer: Medicare Other

## 2022-11-29 DIAGNOSIS — R262 Difficulty in walking, not elsewhere classified: Secondary | ICD-10-CM | POA: Diagnosis not present

## 2022-11-29 DIAGNOSIS — M5416 Radiculopathy, lumbar region: Secondary | ICD-10-CM | POA: Diagnosis not present

## 2022-11-29 DIAGNOSIS — M5459 Other low back pain: Secondary | ICD-10-CM | POA: Diagnosis not present

## 2022-11-29 DIAGNOSIS — M5431 Sciatica, right side: Secondary | ICD-10-CM | POA: Diagnosis not present

## 2022-11-29 DIAGNOSIS — M6281 Muscle weakness (generalized): Secondary | ICD-10-CM

## 2022-11-29 DIAGNOSIS — R2681 Unsteadiness on feet: Secondary | ICD-10-CM | POA: Diagnosis not present

## 2022-11-29 DIAGNOSIS — K219 Gastro-esophageal reflux disease without esophagitis: Secondary | ICD-10-CM | POA: Diagnosis not present

## 2022-11-30 NOTE — Telephone Encounter (Signed)
LVM to call back to inform pt that we need her to come up her to sign  med release form

## 2022-11-30 NOTE — Telephone Encounter (Signed)
Unable to leave vm pt needs to come in to sign med release

## 2022-12-01 ENCOUNTER — Ambulatory Visit: Payer: Medicare Other

## 2022-12-01 DIAGNOSIS — J300 Vasomotor rhinitis: Secondary | ICD-10-CM | POA: Diagnosis not present

## 2022-12-01 DIAGNOSIS — R1314 Dysphagia, pharyngoesophageal phase: Secondary | ICD-10-CM | POA: Diagnosis not present

## 2022-12-02 ENCOUNTER — Other Ambulatory Visit (INDEPENDENT_AMBULATORY_CARE_PROVIDER_SITE_OTHER): Payer: Self-pay | Admitting: Nurse Practitioner

## 2022-12-02 ENCOUNTER — Ambulatory Visit: Payer: Medicare Other | Admitting: Physical Therapy

## 2022-12-02 DIAGNOSIS — E7849 Other hyperlipidemia: Secondary | ICD-10-CM | POA: Diagnosis not present

## 2022-12-02 DIAGNOSIS — I10 Essential (primary) hypertension: Secondary | ICD-10-CM | POA: Diagnosis not present

## 2022-12-02 DIAGNOSIS — G4733 Obstructive sleep apnea (adult) (pediatric): Secondary | ICD-10-CM | POA: Diagnosis not present

## 2022-12-02 DIAGNOSIS — Z87891 Personal history of nicotine dependence: Secondary | ICD-10-CM | POA: Diagnosis not present

## 2022-12-02 DIAGNOSIS — R001 Bradycardia, unspecified: Secondary | ICD-10-CM | POA: Diagnosis not present

## 2022-12-02 DIAGNOSIS — M7989 Other specified soft tissue disorders: Secondary | ICD-10-CM

## 2022-12-02 DIAGNOSIS — I2510 Atherosclerotic heart disease of native coronary artery without angina pectoris: Secondary | ICD-10-CM | POA: Diagnosis not present

## 2022-12-05 NOTE — Therapy (Unsigned)
OUTPATIENT PHYSICAL THERAPY NEURO TREATMENT   Patient Name: Jennifer [name] MRN: [id number] DOB:December 18, 1950, 72 y.o., female Today's Date: 12/07/2022   PCP: [name], FNP REFERRING PROVIDER: [name], FNP  END OF SESSION:  PT End of Session - 12/06/22 1313     Visit Number 13    Number of Visits 24    Date for PT Re-Evaluation 12/08/22    PT Start Time 1315    PT Stop Time 1359    PT Time Calculation (min) 44 min    Equipment Utilized During Treatment Gait belt    Activity Tolerance Patient tolerated treatment well    Behavior During Therapy WFL for tasks assessed/performed                      Past Medical History:  Diagnosis Date   Anemia    Anxiety    Bariatric surgery status    Complication of anesthesia    Diificulty breathing for about 15 minutes after bariatric surgery   Constipation    COVID-19    Dysrhythmia    Elevated liver enzymes    GERD (gastroesophageal reflux disease)    Hemorrhoids    Herpes genitalis    High cholesterol    Hyperlipidemia    Hypertension    Hypothyroidism    IDA (iron deficiency anemia) 02/09/2021   Neuropathy    Osteoarthritis    Sleep apnea    CPAP   Past Surgical History:  Procedure Laterality Date   ABDOMINAL HYSTERECTOMY     total for fibroids no h/o abnormal pap   bariatric sleeve  2015   BREAST EXCISIONAL BIOPSY Left 1998   carpal tunnel repair     CATARACT EXTRACTION W/PHACO Right 11/01/2021   Procedure: CATARACT EXTRACTION PHACO AND INTRAOCULAR LENS PLACEMENT (IOC) RIGHT;  Surgeon: Nevada Crane, MD;  Location: [facility];  Service: Ophthalmology;  Laterality: Right;  sleep apnea 4.95 00:49.7   CATARACT EXTRACTION W/PHACO Left 11/15/2021   Procedure: CATARACT EXTRACTION PHACO AND INTRAOCULAR LENS PLACEMENT (IOC) LEFT 4.94 00:32.3;  Surgeon: Nevada Crane, MD;  Location: [facility];  Service: Ophthalmology;  Laterality: Left;  sleep apnea   COLONOSCOPY WITH  PROPOFOL N/A 02/11/2016   Procedure: COLONOSCOPY WITH PROPOFOL;  Surgeon: Wyline Mood, MD;  Location: [facility];  Service: Endoscopy;  Laterality: N/A;   COLONOSCOPY WITH PROPOFOL N/A 10/01/2019   Procedure: COLONOSCOPY WITH PROPOFOL;  Surgeon: Wyline Mood, MD;  Location: [facility];  Service: Gastroenterology;  Laterality: N/A;   COLONOSCOPY WITH PROPOFOL N/A 11/19/2020   Procedure: COLONOSCOPY WITH PROPOFOL;  Surgeon: Wyline Mood, MD;  Location: [facility] END[facility]COPY;  Service: Gastroenterology;  Laterality: N/A;   COLONOSCOPY WITH PROPOFOL N/A 05/05/2022   Procedure: COLONOSCOPY WITH PROPOFOL;  Surgeon: Regis Bill, MD;  Location: [facility];  Service: Endoscopy;  Laterality: N/A;   ESOPHAGOGASTRODUODENOSCOPY (EGD) WITH PROPOFOL N/A 12/02/2019   Procedure: ESOPHAGOGASTRODUODENOSCOPY (EGD) WITH PROPOFOL;  Surgeon: Wyline Mood, MD;  Location: [facility]COPY;  Service: Gastroenterology;  Laterality: N/A;   ESOPHAGOGASTRODUODENOSCOPY (EGD) WITH PROPOFOL N/A 11/19/2020   Procedure: ESOPHAGOGASTRODUODENOSCOPY (EGD) WITH PROPOFOL;  Surgeon: Wyline Mood, MD;  Location: [facility];  Service: Gastroenterology;  Laterality: N/A;   ESOPHAGOGASTRODUODENOSCOPY (EGD) WITH PROPOFOL N/A 05/05/2022   Procedure: ESOPHAGOGASTRODUODENOSCOPY (EGD) WITH PROPOFOL;  Surgeon: Regis Bill, MD;  Location: [facility];  Service: Endoscopy;  Laterality: N/A;   FLEXIBLE SIGMOIDOSCOPY N/A 02/06/2021   Procedure: FLEXIBLE SIGMOIDOSCOPY;  Surgeon: Jaynie Collins, DO;  Location: [facility];  Service: Gastroenterology;  Laterality: N/A;   GIVENS CAPSULE STUDY N/A 06/07/2021   Procedure: GIVENS CAPSULE STUDY;  Surgeon: Toney Reil, MD;  Location: [facility] ENDOSCOPY;  Service: Gastroenterology;  Laterality: N/A;   GIVENS CAPSULE STUDY N/A 06/09/2021   Procedure: GIVENS CAPSULE STUDY;  Surgeon: Toney Reil, MD;  Location: [facility] ENDOSCOPY;  Service: Gastroenterology;  Laterality: N/A;    HEMORRHOID SURGERY     LAPAROSCOPIC GASTRIC RESTRICTIVE DUODENAL PROCEDURE (DUODENAL SWITCH) Bilateral    2020   Patient Active Problem List   Diagnosis Date Noted   Friction blister 07/22/2022   Acute on chronic anemia 05/03/2022   Melena 05/03/2022   Alcohol use disorder 05/03/2022   Transaminitis 11/22/2021   Tinea pedis 08/31/2021   Upper GI bleed 06/06/2021   Eye discharge 02/16/2021   IDA (iron deficiency anemia) 02/09/2021   Acute blood loss anemia 02/06/2021   Elevated liver function tests    Bright red blood per rectum 02/05/2021   Open wound of left heel 01/20/2021   Anxiety and depression 11/25/2020   CPAP (continuous positive airway pressure) dependence 11/25/2020   Sinusitis 11/25/2020   Allergic conjunctivitis of both eyes and rhinitis 11/25/2020   GI bleed 11/17/2020   Chronic iron deficiency anemia    Symptomatic anemia 11/11/2020   Postop check 08/07/2020   Biliary sludge determined by ultrasound 07/02/2020   Right upper quadrant abdominal pain 06/10/2020   Right-sided thoracic back pain 05/18/2020   Muscle spasm 05/18/2020   Bilateral tinnitus 04/30/2020   Irregular heartbeat 02/25/2020   Diabetes mellitus without complication (HCC) 02/25/2020   Strain of right knee 02/10/2020   Aspirin long-term use 10/22/2019   Wound ballistics 08/29/2019   Displacement of lumbar intervertebral disc without myelopathy 08/29/2019   Helicobacter pylori gastrointestinal tract infection 08/29/2019   Hypercholesterolemia 08/29/2019   Phlebitis after infusion 06/26/2019   Superficial thrombophlebitis of left upper extremity 06/26/2019   BMI 38.0-38.9,adult 06/19/2019   Small bowel obstruction (HCC) 05/25/2019   Morbid obesity (HCC) 05/16/2019   Cervical pain (neck) 04/09/2019   Pedal edema 03/06/2019   Pre-operative clearance 03/06/2019   History of sleeve gastrectomy 01/23/2019   Chronic pain syndrome 10/03/2018   Obstructive sleep apnea 10/03/2018   Right shoulder  pain 06/08/2018   Chronic venous insufficiency 05/24/2018   Lymphedema 05/24/2018   Lumbar spondylosis 05/17/2018   Leg swelling 05/09/2018   Neuropathy 01/24/2018   Insomnia 07/12/2017   Fracture of phalanx of finger 05/31/2017   Impingement syndrome of shoulder region 05/31/2017   Hemorrhoids 12/15/2016   Hypothyroidism 10/03/2016   Chronic shoulder bursitis 08/30/2016   Positive ANA (antinuclear antibody) 08/30/2016   Bradycardia 06/16/2016   History of adenomatous polyp of colon 05/02/2016   Elevated liver enzymes 04/06/2016   OSA (obstructive sleep apnea) 04/06/2016   Bilateral shoulder pain 01/13/2016   Fatty liver 12/08/2015   Arthritis 12/08/2015   History of bariatric surgery 12/07/2015   Genital herpes 11/11/2015   Hyperlipidemia 11/11/2015   Essential hypertension 11/11/2015   Routine physical examination 11/11/2015   Allergic rhinitis 11/11/2015   GERD (gastroesophageal reflux disease) 11/11/2015   Herpesviral infection, unspecified 01/08/2014   Disorder of thyroid, unspecified 01/08/2014   Lumbar radiculitis 11/19/2013   Neuritis or radiculitis due to rupture of lumbar intervertebral disc 11/19/2013   Monilial vaginitis 08/20/2013   Gastritis and duodenitis 07/25/2013   Impaired fasting glucose 07/17/2012   Obesity, unspecified 07/17/2012   Obesity 07/17/2012   Depression 03/15/2012   Abnormal electrocardiogram (ECG) (EKG) 03/06/2012  URI (upper respiratory infection) 01/16/2012    ONSET DATE: 1 and 1/2 years ago  REFERRING DIAG: G62.9 (ICD-10-CM) - Neuropathy   THERAPY DIAG:  Difficulty in walking, not elsewhere classified  Muscle weakness (generalized)  Unsteadiness on feet  Other low back pain  Rationale for Evaluation and Treatment: Rehabilitation  SUBJECTIVE:                                                                                                                                                                                              SUBJECTIVE STATEMENT:  Pt reports she is doing well. She had to reschedule her visit last week due to pain in her back and arm. She visited her cardiologist who informed her it could be due to ischemia and if it persists to follow up with her PCP. She is not in any pain today.    Pt accompanied by: self  PERTINENT HISTORY: Asheton Odgers is a 72 year old female with past medical History of iron deficiency anemia (receiving iron infusions and recent right heel ulcer (healed per chart) and extensive medical history as reported in above section. Paitent was seen by PCP and diagnosed with gait/balance impairments.   PAIN:  Are you having pain? Yes: NPRS scale: 4/10 Pain location: bilateral lower legs into feet Pain description: Burning, tingling, numbness, cold Aggravating factors: increased standing > 15 min- worse pain at night Relieving factors: Gabapentin/tylenol, heating pad  PRECAUTIONS: Fall  RED FLAGS: None   WEIGHT BEARING RESTRICTIONS: No  FALLS: Has patient fallen in last 6 months? No  LIVING ENVIRONMENT: Lives with: lives with their spouse Lives in: House/apartment Stairs: Yes: External: 3-4 steps; on left going up Has following equipment at home: Single point cane and Grab bars  PLOF: Independent  PATIENT GOALS: Improve my balance.   OBJECTIVE:   DIAGNOSTIC FINDINGS: None avaible in chart  COGNITION: Overall cognitive status: Within functional limits for tasks assessed   SENSATION: Light touch: Impaired - Bilateral LE  COORDINATION: Slow/delay reaction  EDEMA:  None observed   POSTURE: forward head  LOWER EXTREMITY ROM:      LOWER EXTREMITY MMT:    MMT Right Eval Left Eval  Hip flexion 4- 4-  Hip extension 4- 4-  Hip abduction 4- 4-  Hip adduction 4 4  Hip internal rotation 3+ 3+  Hip external rotation 3+ 3+  Knee flexion 4 4  Knee extension 4 4  Ankle dorsiflexion 4 4  Ankle plantarflexion    Ankle inversion    Ankle eversion     (Blank rows = not tested)  TRANSFERS: Assistive device utilized: None  Sit to stand: Complete Independence Stand to sit: Complete Independence Chair to chair: Complete Independence Floor:  Not tested    GAIT: Gait pattern: decreased step length- Right, decreased step length- Left, and shuffling unsteady Distance walked: 120 feet Assistive device utilized: None Level of assistance: CGA Comments: unsteady intermittent  FUNCTIONAL TESTS:  5 times sit to stand: 27.61 sec without UE support  Timed up and go (TUG): 18.33 sec 10 meter walk test: 0.66 m/s Berg Balance Scale: 42/56  PATIENT SURVEYS:  FOTO 40  TODAY'S TREATMENT:                                                                                                                              DATE: 12/07/22  Unless otherwise stated, CGA was provided and gait belt donned in order to ensure pt safety  Therex: - STS w/ medicine ball front press 2x10 - Step ups from airex with 3#aw 2x10 each LE - Lateral steps form airex to airex 2x10 each LE, attempted with 3#aw but due to difficulty performed without, as patient increased repetitions she was able to perform with increased stability   NMR:  -Blaze pod circle activity- Pods set to random and arranged in circle with pt in the middle. If the pod lights up red, patient must tap it with her right lower extremity. If pod lights up green, patient must tap it with her left lower extremity. Activity focusing on single leg balance, righting reactions, and adding in a cognitive challenge.  x3 rounds, 45 hits total   -Tandem stance x30s each LE -Tandem stance with ball toss to PT, performed for ten catches, 2x10 each LE, tandem more difficult with right foot behind  -Single leg stance x30s each LE -Single leg stance with ball toss to PT, performed for ten catches, 2x10 each LE -Airex pad: one foot on Dynadisc, one foot on airex pad, hold position for 30 seconds, switch legs, 2x each LE;   noticeable trembling of ankles/LE's with fatigue and challenge to maintain stability -Single leg balance with cone pick up off the ground x4 reps each LE  PATIENT EDUCATION: Education details: Purpose of PT/plan of care. Pt educated throughout session about proper posture and technique with exercises. Improved exercise technique, movement at target joints, use of target muscles after min to mod verbal, visual, tactile cues.  Person educated: Patient Education method: Explanation Education comprehension: verbalized understanding  HOME EXERCISE PROGRAM:  *Issued RTB for home use Access Code: [email or web address] URL: [email or web address] Date: 09/20/2022 Prepared by: Maureen Ralphs  Exercises - Seated Hip Flexion March with Ankle Weights  - 1 x daily - 3 x weekly - 3 sets - 10 reps - Seated Long Arc Quad with Ankle Weight  - 1 x daily - 3 x weekly - 3 sets - 10 reps - Seated Hip Flexion and External Rotation  - 1 x daily - 3 x weekly - 3 sets - 10 reps - Seated Hip  Internal Rotation with Resistance  - 1 x daily - 3 x weekly - 3 sets - 10 reps - Seated Hamstring Curl with Anchored Resistance  - 1 x daily - 3 x weekly - 3 sets - 10 reps - Sit to Stand with Arms Crossed  - 1 x daily - 3 x weekly - 3 sets - 10 reps - Standing Hip Abduction with Counter Support  - 1 x daily - 7 x weekly - 3 sets - 10 reps - 2 hold - Standing Knee Flexion with Counter Support  - 1 x daily - 7 x weekly - 3 sets - 10 reps - 2 hold - Standing Tandem Balance with Counter Support  - 1 x daily - 7 x weekly - 3 sets - 4 reps - 15 hold - Side Stepping with Counter Support  - 1 x daily - 7 x weekly - 3 sets - 10 reps - Heel Toe Raises with Counter Support  - 1 x daily - 7 x weekly - 3 sets - 10 reps  GOALS: Goals reviewed with patient? Yes  SHORT TERM GOALS: Target date: 10/27/2022  Pt will be independent with HEP in order to improve strength and balance in order to decrease fall risk and improve function  at home and work.  Baseline: EVAL- Patient has no formal HEP in place, 10/31: pt compliant with HEP Goal status: MET   LONG TERM GOALS: Target date: 12/08/2022   1.  Patient (> 91 years old) will complete five times sit to stand test in < 10 seconds indicating an increased LE strength and improved balance.  Baseline: EVAL= 27.61 sec 10/31: 13.8 sec Goal status: MET, REVISED  2.  Patient will increase FOTO score to equal to or greater than  54   to demonstrate statistically significant improvement in mobility and quality of life.  Baseline: EVAL= 40 10/31: 59.7819 Goal status: MET   3.  Patient will increase Berg Balance score by > 6 points to demonstrate decreased fall risk during functional activities. Baseline: EVAL: 42/56 10/31: 51/56 Goal status: MET   4.  Patient will reduce timed up and go to <13 seconds to reduce fall risk and demonstrate improved transfer/gait ability. Baseline: EVAL= 18.33 sec 10/31: 14.61s without cane 14.41s with cane Goal status: IN PROGRESS  5.  Patient will increase 10 meter walk test to >1.34m/s as to improve gait speed for better community ambulation and to reduce fall risk. Baseline: EVAL= 0.66 m/s 10/31: 1.06 m/s with cane, 1.12 m/s without cane  Goal status: MET  6. Patient will increase Functional Gait Assessment score to >20/30 as to reduce fall risk and improve dynamic gait safety with community ambulation. Baseline: 19 Goal status: NEW  ASSESSMENT:  CLINICAL IMPRESSION:  Pt presents to session motivated and eager to participate. She is demonstrating improvements in balance, evidenced by increased single leg stance time. She was able to toss a ball while maintaining tandem stance indicating improved stability with narrow base of support. With balance activities, there is increased demand on the left LE to maintain stability. Pt will continue to benefit from skilled physical therapy intervention to address impairments, improve QOL, and attain  therapy goals.      OBJECTIVE IMPAIRMENTS: Abnormal gait, decreased activity tolerance, decreased balance, decreased coordination, decreased endurance, decreased mobility, difficulty walking, decreased strength, impaired sensation, and pain.   ACTIVITY LIMITATIONS: carrying, lifting, bending, standing, squatting, stairs, transfers, and bathing  PARTICIPATION LIMITATIONS: cleaning, laundry, shopping, community activity, and yard  work  PERSONAL FACTORS: 3+ comorbidities: HTN, anemia, DM  are also affecting patient's functional outcome.   REHAB POTENTIAL: Good  CLINICAL DECISION MAKING: Stable/uncomplicated  EVALUATION COMPLEXITY: Moderate  PLAN:  PT FREQUENCY: 1-2x/week  PT DURATION: 12 weeks  PLANNED INTERVENTIONS: Therapeutic exercises, Therapeutic activity, Neuromuscular re-education, Balance training, Gait training, Patient/Family education, Self Care, Joint mobilization, Joint manipulation, Stair training, Vestibular training, Canalith repositioning, DME instructions, Dry Needling, Spinal manipulation, Spinal mobilization, Cryotherapy, Moist heat, Manual therapy, and Re-evaluation  PLAN FOR NEXT SESSION:    Instruct in LE strengthening and balance, balance activities incorporating single leg and narrow BOS, dynamic balance activities, add balance to HEP  Randon Goldsmith, Student-PT  This entire session was performed under direct supervision and direction of a licensed therapist/therapist assistant . I have personally read, edited and approve of the note as written.  Precious Bard, PT, DPT Physical Therapist - [facility]  Outpatient Physical Therapy- Main Campus [phone]     7:23 AM 12/07/22

## 2022-12-06 ENCOUNTER — Ambulatory Visit: Payer: Medicare Other

## 2022-12-06 DIAGNOSIS — M5416 Radiculopathy, lumbar region: Secondary | ICD-10-CM | POA: Diagnosis not present

## 2022-12-06 DIAGNOSIS — R262 Difficulty in walking, not elsewhere classified: Secondary | ICD-10-CM

## 2022-12-06 DIAGNOSIS — R2681 Unsteadiness on feet: Secondary | ICD-10-CM

## 2022-12-06 DIAGNOSIS — M6281 Muscle weakness (generalized): Secondary | ICD-10-CM

## 2022-12-06 DIAGNOSIS — M5459 Other low back pain: Secondary | ICD-10-CM | POA: Diagnosis not present

## 2022-12-06 DIAGNOSIS — M5431 Sciatica, right side: Secondary | ICD-10-CM | POA: Diagnosis not present

## 2022-12-06 NOTE — Telephone Encounter (Signed)
Noted  Med Release placed upfront for pt signature

## 2022-12-06 NOTE — Telephone Encounter (Signed)
Patient just called. I read her the message about her coming in to sign med release form. She said she will be in today sometime to sign. Her number is [phone].

## 2022-12-06 NOTE — Telephone Encounter (Signed)
LVM to call back to inform pt that we need her to come sign med release

## 2022-12-12 ENCOUNTER — Ambulatory Visit: Payer: Medicare Other

## 2022-12-12 DIAGNOSIS — M5416 Radiculopathy, lumbar region: Secondary | ICD-10-CM

## 2022-12-12 DIAGNOSIS — M5459 Other low back pain: Secondary | ICD-10-CM | POA: Diagnosis not present

## 2022-12-12 DIAGNOSIS — R262 Difficulty in walking, not elsewhere classified: Secondary | ICD-10-CM | POA: Diagnosis not present

## 2022-12-12 DIAGNOSIS — M6281 Muscle weakness (generalized): Secondary | ICD-10-CM | POA: Diagnosis not present

## 2022-12-12 DIAGNOSIS — R2681 Unsteadiness on feet: Secondary | ICD-10-CM

## 2022-12-12 DIAGNOSIS — M5431 Sciatica, right side: Secondary | ICD-10-CM | POA: Diagnosis not present

## 2022-12-12 NOTE — Therapy (Signed)
OUTPATIENT PHYSICAL THERAPY TREATMENT/RECERT   Patient Name: Jennifer [name] MRN: [id number] DOB:1950/03/23, 72 y.o., female Today's Date: 12/12/2022   PCP: [name], FNP REFERRING PROVIDER: [name], FNP  END OF SESSION:  PT End of Session - 12/12/22 1318     Visit Number 14    Number of Visits 24    Date for PT Re-Evaluation 01/23/23    Authorization Type Medicare A&B; [employer] supplemental    Authorization Time Period 12/12/22-01/23/23 (6 weeks)    Progress Note Due on Visit 20    PT Start Time 1315    PT Stop Time 1355    PT Time Calculation (min) 40 min    Equipment Utilized During Treatment Gait belt    Activity Tolerance Patient tolerated treatment well    Behavior During Therapy WFL for tasks assessed/performed                      Past Medical History:  Diagnosis Date   Anemia    Anxiety    Bariatric surgery status    Complication of anesthesia    Diificulty breathing for about 15 minutes after bariatric surgery   Constipation    COVID-19    Dysrhythmia    Elevated liver enzymes    GERD (gastroesophageal reflux disease)    Hemorrhoids    Herpes genitalis    High cholesterol    Hyperlipidemia    Hypertension    Hypothyroidism    IDA (iron deficiency anemia) 02/09/2021   Neuropathy    Osteoarthritis    Sleep apnea    CPAP   Past Surgical History:  Procedure Laterality Date   ABDOMINAL HYSTERECTOMY     total for fibroids no h/o abnormal pap   bariatric sleeve  2015   BREAST EXCISIONAL BIOPSY Left 1998   carpal tunnel repair     CATARACT EXTRACTION W/PHACO Right 11/01/2021   Procedure: CATARACT EXTRACTION PHACO AND INTRAOCULAR LENS PLACEMENT (IOC) RIGHT;  Surgeon: Nevada Crane, MD;  Location: [facility];  Service: Ophthalmology;  Laterality: Right;  sleep apnea 4.95 00:49.7   CATARACT EXTRACTION W/PHACO Left 11/15/2021   Procedure: CATARACT EXTRACTION PHACO AND INTRAOCULAR LENS PLACEMENT (IOC) LEFT 4.94  00:32.3;  Surgeon: Nevada Crane, MD;  Location: [facility];  Service: Ophthalmology;  Laterality: Left;  sleep apnea   COLONOSCOPY WITH PROPOFOL N/A 02/11/2016   Procedure: COLONOSCOPY WITH PROPOFOL;  Surgeon: Wyline Mood, MD;  Location: [facility] ENDOSCOPY;  Service: Endoscopy;  Laterality: N/A;   COLONOSCOPY WITH PROPOFOL N/A 10/01/2019   Procedure: COLONOSCOPY WITH PROPOFOL;  Surgeon: Wyline Mood, MD;  Location: [facility];  Service: Gastroenterology;  Laterality: N/A;   COLONOSCOPY WITH PROPOFOL N/A 11/19/2020   Procedure: COLONOSCOPY WITH PROPOFOL;  Surgeon: Wyline Mood, MD;  Location: [facility] ENDOSCOPY;  Service: Gastroenterology;  Laterality: N/A;   COLONOSCOPY WITH PROPOFOL N/A 05/05/2022   Procedure: COLONOSCOPY WITH PROPOFOL;  Surgeon: Regis Bill, MD;  Location: [facility] ENDOSCOPY;  Service: Endoscopy;  Laterality: N/A;   ESOPHAGOGASTRODUODENOSCOPY (EGD) WITH PROPOFOL N/A 12/02/2019   Procedure: ESOPHAGOGASTRODUODENOSCOPY (EGD) WITH PROPOFOL;  Surgeon: Wyline Mood, MD;  Location: [facility] ENDOSCOPY;  Service: Gastroenterology;  Laterality: N/A;   ESOPHAGOGASTRODUODENOSCOPY (EGD) WITH PROPOFOL N/A 11/19/2020   Procedure: ESOPHAGOGASTRODUODENOSCOPY (EGD) WITH PROPOFOL;  Surgeon: Wyline Mood, MD;  Location: [facility] END[facility]COPY;  Service: Gastroenterology;  Laterality: N/A;   ESOPHAGOGASTRODUODENOSCOPY (EGD) WITH PROPOFOL N/A 05/05/2022   Procedure: ESOPHAGOGASTRODUODENOSCOPY (EGD) WITH PROPOFOL;  Surgeon: Regis Bill, MD;  Location: [facility] ENDOSCOPY;  Service: Endoscopy;  Laterality: N/A;   FLEXIBLE SIGMOIDOSCOPY N/A 02/06/2021   Procedure: FLEXIBLE SIGMOIDOSCOPY;  Surgeon: Jaynie Collins, DO;  Location: [facility] ENDOSCOPY;  Service: Gastroenterology;  Laterality: N/A;   GIVENS CAPSULE STUDY N/A 06/07/2021   Procedure: GIVENS CAPSULE STUDY;  Surgeon: Toney Reil, MD;  Location: [facility] ENDOSCOPY;  Service: Gastroenterology;  Laterality: N/A;   GIVENS CAPSULE STUDY N/A  06/09/2021   Procedure: GIVENS CAPSULE STUDY;  Surgeon: Toney Reil, MD;  Location: [facility] ENDOSCOPY;  Service: Gastroenterology;  Laterality: N/A;   HEMORRHOID SURGERY     LAPAROSCOPIC GASTRIC RESTRICTIVE DUODENAL PROCEDURE (DUODENAL SWITCH) Bilateral    2020   Patient Active Problem List   Diagnosis Date Noted   Friction blister 07/22/2022   Acute on chronic anemia 05/03/2022   Melena 05/03/2022   Alcohol use disorder 05/03/2022   Transaminitis 11/22/2021   Tinea pedis 08/31/2021   Upper GI bleed 06/06/2021   Eye discharge 02/16/2021   IDA (iron deficiency anemia) 02/09/2021   Acute blood loss anemia 02/06/2021   Elevated liver function tests    Bright red blood per rectum 02/05/2021   Open wound of left heel 01/20/2021   Anxiety and depression 11/25/2020   CPAP (continuous positive airway pressure) dependence 11/25/2020   Sinusitis 11/25/2020   Allergic conjunctivitis of both eyes and rhinitis 11/25/2020   GI bleed 11/17/2020   Chronic iron deficiency anemia    Symptomatic anemia 11/11/2020   Postop check 08/07/2020   Biliary sludge determined by ultrasound 07/02/2020   Right upper quadrant abdominal pain 06/10/2020   Right-sided thoracic back pain 05/18/2020   Muscle spasm 05/18/2020   Bilateral tinnitus 04/30/2020   Irregular heartbeat 02/25/2020   Diabetes mellitus without complication (HCC) 02/25/2020   Strain of right knee 02/10/2020   Aspirin long-term use 10/22/2019   Wound ballistics 08/29/2019   Displacement of lumbar intervertebral disc without myelopathy 08/29/2019   Helicobacter pylori gastrointestinal tract infection 08/29/2019   Hypercholesterolemia 08/29/2019   Phlebitis after infusion 06/26/2019   Superficial thrombophlebitis of left upper extremity 06/26/2019   BMI 38.0-38.9,adult 06/19/2019   Small bowel obstruction (HCC) 05/25/2019   Morbid obesity (HCC) 05/16/2019   Cervical pain (neck) 04/09/2019   Pedal edema 03/06/2019   Pre-operative  clearance 03/06/2019   History of sleeve gastrectomy 01/23/2019   Chronic pain syndrome 10/03/2018   Obstructive sleep apnea 10/03/2018   Right shoulder pain 06/08/2018   Chronic venous insufficiency 05/24/2018   Lymphedema 05/24/2018   Lumbar spondylosis 05/17/2018   Leg swelling 05/09/2018   Neuropathy 01/24/2018   Insomnia 07/12/2017   Fracture of phalanx of finger 05/31/2017   Impingement syndrome of shoulder region 05/31/2017   Hemorrhoids 12/15/2016   Hypothyroidism 10/03/2016   Chronic shoulder bursitis 08/30/2016   Positive ANA (antinuclear antibody) 08/30/2016   Bradycardia 06/16/2016   History of adenomatous polyp of colon 05/02/2016   Elevated liver enzymes 04/06/2016   OSA (obstructive sleep apnea) 04/06/2016   Bilateral shoulder pain 01/13/2016   Fatty liver 12/08/2015   Arthritis 12/08/2015   History of bariatric surgery 12/07/2015   Genital herpes 11/11/2015   Hyperlipidemia 11/11/2015   Essential hypertension 11/11/2015   Routine physical examination 11/11/2015   Allergic rhinitis 11/11/2015   GERD (gastroesophageal reflux disease) 11/11/2015   Herpesviral infection, unspecified 01/08/2014   Disorder of thyroid, unspecified 01/08/2014   Lumbar radiculitis 11/19/2013   Neuritis or radiculitis due to rupture of lumbar intervertebral disc 11/19/2013   Monilial vaginitis 08/20/2013   Gastritis and duodenitis 07/25/2013  Impaired fasting glucose 07/17/2012   Obesity, unspecified 07/17/2012   Obesity 07/17/2012   Depression 03/15/2012   Abnormal electrocardiogram (ECG) (EKG) 03/06/2012   URI (upper respiratory infection) 01/16/2012    ONSET DATE: 1 and 1/2 years ago  REFERRING DIAG: G62.9 (ICD-10-CM) - Neuropathy   THERAPY DIAG:  Difficulty in walking, not elsewhere classified - Plan: PT plan of care cert/re-cert  Muscle weakness (generalized) - Plan: PT plan of care cert/re-cert  Unsteadiness on feet - Plan: PT plan of care cert/re-cert  Other low  back pain - Plan: PT plan of care cert/re-cert  Radiculopathy, lumbar region - Plan: PT plan of care cert/re-cert  Sciatica, right side - Plan: PT plan of care cert/re-cert  Rationale for Evaluation and Treatment: Rehabilitation  SUBJECTIVE:                                                                                                                                                                                             SUBJECTIVE STATEMENT:  Doing well. Has been working on exercise at home, got on treadmill for 1 hour. Ready to start decreasing visits in preparation for DC.    Pt accompanied by: self  PERTINENT HISTORY: Johnene Granade is a 72 year old female with past medical History of iron deficiency anemia (receiving iron infusions and recent right heel ulcer (healed per chart) and extensive medical history as reported in above section. Paitent was seen by PCP and diagnosed with gait/balance impairments.   PAIN:  Are you having pain? A little   PRECAUTIONS: Fall  RED FLAGS: None   WEIGHT BEARING RESTRICTIONS: No  FALLS: Has patient fallen in last 6 months? No  LIVING ENVIRONMENT: Lives with: lives with their spouse Lives in: House/apartment Stairs: Yes: External: 3-4 steps; on left going up Has following equipment at home: Single point cane and Grab bars  PLOF: Independent  PATIENT GOALS: Improve my balance.   OBJECTIVE:   TODAY'S TREATMENT:  DATE: 12/12/22   -Tandem stance 2x30 sec bilat  -SLS with ball toss x10 bilat  -STS + 6lb chest press x10  -lateral side steps 1x20 bilat x 3lb AW  -standing high knee marching c 3lb AW bilat  -lateral step up floor to foam to floor then reverse x6x bilat  -marching in place on foam x30  -TUG test x12 c 3lb AW bilat (cues to alternate turn direction)   PATIENT EDUCATION: Education details:  Purpose of PT/plan of care. Pt educated throughout session about proper posture and technique with exercises. Improved exercise technique, movement at target joints, use of target muscles after min to mod verbal, visual, tactile cues.  Person educated: Patient Education method: Explanation Education comprehension: verbalized understanding  HOME EXERCISE PROGRAM:  *Issued RTB for home use Access Code: [email or web address] URL: [email or web address] Date: 09/20/2022 Prepared by: Maureen Ralphs  Exercises - Seated Hip Flexion March with Ankle Weights  - 1 x daily - 3 x weekly - 3 sets - 10 reps - Seated Long Arc Quad with Ankle Weight  - 1 x daily - 3 x weekly - 3 sets - 10 reps - Seated Hip Flexion and External Rotation  - 1 x daily - 3 x weekly - 3 sets - 10 reps - Seated Hip Internal Rotation with Resistance  - 1 x daily - 3 x weekly - 3 sets - 10 reps - Seated Hamstring Curl with Anchored Resistance  - 1 x daily - 3 x weekly - 3 sets - 10 reps - Sit to Stand with Arms Crossed  - 1 x daily - 3 x weekly - 3 sets - 10 reps - Standing Hip Abduction with Counter Support  - 1 x daily - 7 x weekly - 3 sets - 10 reps - 2 hold - Standing Knee Flexion with Counter Support  - 1 x daily - 7 x weekly - 3 sets - 10 reps - 2 hold - Standing Tandem Balance with Counter Support  - 1 x daily - 7 x weekly - 3 sets - 4 reps - 15 hold - Side Stepping with Counter Support  - 1 x daily - 7 x weekly - 3 sets - 10 reps - Heel Toe Raises with Counter Support  - 1 x daily - 7 x weekly - 3 sets - 10 reps  GOALS: Goals reviewed with patient? Yes  SHORT TERM GOALS: Target date: 10/27/2022  Pt will be independent with HEP in order to improve strength and balance in order to decrease fall risk and improve function at home and work.  Baseline: EVAL- Patient has no formal HEP in place, 10/31: pt compliant with HEP Goal status: MET   LONG TERM GOALS: Target date: 12/08/2022   1.  Patient (> 62 years old)  will complete five times sit to stand test in < 10 seconds indicating an increased LE strength and improved balance.  Baseline: EVAL= 27.61 sec 10/31: 13.8 sec Goal status: MET, REVISED  2.  Patient will increase FOTO score to equal to or greater than  54   to demonstrate statistically significant improvement in mobility and quality of life.  Baseline: EVAL= 40 10/31: 59.7819 Goal status: MET   3.  Patient will increase Berg Balance score by > 6 points to demonstrate decreased fall risk during functional activities. Baseline: EVAL: 42/56 10/31: 51/56 Goal status: MET   4.  Patient will reduce timed up and go to <13 seconds to reduce fall risk and demonstrate improved  transfer/gait ability. Baseline: EVAL= 18.33 sec 10/31: 14.61s without cane 14.41s with cane; 12/12/22: 11sec c 3lb AW  Goal status: MET   5.  Patient will increase 10 meter walk test to >1.87m/s as to improve gait speed for better community ambulation and to reduce fall risk. Baseline: EVAL= 0.66 m/s 10/31: 1.06 m/s with cane, 1.12 m/s without cane  Goal status: MET  6. Patient will increase Functional Gait Assessment score to >20/30 as to reduce fall risk and improve dynamic gait safety with community ambulation. Baseline: 19 Goal status: NEW  ASSESSMENT:  CLINICAL IMPRESSION:  Pt presents to session motivated and eager to participate. She is demonstrating improvements in balance, evidenced by increased single leg stance time. Discussed a plan for recert. Pt feels like finishing out the following month will allow for easy transition to independent home program.  4 of 6 LT goals have been made with progress heavily made toward 5th of 6. Will plan on recertification for 6 weeksPt will continue to benefit from skilled physical therapy intervention to address impairments, improve QOL, and attain therapy goals.      OBJECTIVE IMPAIRMENTS: Abnormal gait, decreased activity tolerance, decreased balance, decreased coordination,  decreased endurance, decreased mobility, difficulty walking, decreased strength, impaired sensation, and pain.   ACTIVITY LIMITATIONS: carrying, lifting, bending, standing, squatting, stairs, transfers, and bathing  PARTICIPATION LIMITATIONS: cleaning, laundry, shopping, community activity, and yard work  PERSONAL FACTORS: 3+ comorbidities: HTN, anemia, DM  are also affecting patient's functional outcome.   REHAB POTENTIAL: Good  CLINICAL DECISION MAKING: Stable/uncomplicated  EVALUATION COMPLEXITY: Moderate  PLAN:  PT FREQUENCY: 1-2x/week  PT DURATION: 6 Weeks   PLANNED INTERVENTIONS: Therapeutic exercises, Therapeutic activity, Neuromuscular re-education, Balance training, Gait training, Patient/Family education, Self Care, Joint mobilization, Joint manipulation, Stair training, Vestibular training, Canalith repositioning, DME instructions, Dry Needling, Spinal manipulation, Spinal mobilization, Cryotherapy, Moist heat, Manual therapy, and Re-evaluation  PLAN FOR NEXT SESSION:    Instruct in LE strengthening and balance, balance activities incorporating single leg and narrow BOS, dynamic balance activities, add balance to HEP   2:31 PM, 12/12/22 Rosamaria Lints, PT, DPT Physical Therapist - [facility]  Outpatient Physical Therapy- Main Campus [phone]

## 2022-12-13 ENCOUNTER — Encounter: Payer: Self-pay | Admitting: Obstetrics & Gynecology

## 2022-12-13 ENCOUNTER — Encounter (INDEPENDENT_AMBULATORY_CARE_PROVIDER_SITE_OTHER): Payer: Medicare Other

## 2022-12-13 ENCOUNTER — Ambulatory Visit: Payer: Medicare Other | Admitting: Obstetrics & Gynecology

## 2022-12-13 ENCOUNTER — Ambulatory Visit (INDEPENDENT_AMBULATORY_CARE_PROVIDER_SITE_OTHER): Payer: Medicare Other | Admitting: Nurse Practitioner

## 2022-12-13 ENCOUNTER — Encounter (INDEPENDENT_AMBULATORY_CARE_PROVIDER_SITE_OTHER): Payer: Self-pay

## 2022-12-13 DIAGNOSIS — A6004 Herpesviral vulvovaginitis: Secondary | ICD-10-CM

## 2022-12-13 MED ORDER — ACYCLOVIR 400 MG PO TABS: 400.0000 mg | ORAL_TABLET | Freq: Three times a day (TID) | ORAL | 0 refills | Status: DC

## 2022-12-13 NOTE — Progress Notes (Signed)
    GYNECOLOGY PROGRESS NOTE  Subjective:    Patient ID: Jennifer [name], female    DOB: 05-Sep-1950, 72 y.o.   MRN: [id number]  HPI  Patient is a 72 y.o. [id number] here for the first time since 2021. She is here here because of a sore itchy area on her clitoris for the last 10 days or so. She takes acyclovir daily for a h/o HSV. She reports that her last outbreak was about 10 years ago. She is abstinent.  The following portions of the patient's history were reviewed and updated as appropriate: allergies, current medications, past family history, past medical history, past social history, past surgical history, and problem list.  Review of Systems Pertinent items are noted in HPI.   Objective:   Blood pressure 106/63, height 5\' 1"  (1.549 m), weight 171 lb (77.6 kg). Body mass index is 32.31 kg/m. Well nourished, well hydrated Black female, no apparent distress She is ambulating and conversing normally. EG- ulceration on the right side of the clitoral hood  I took a picture and it should be under the media tab  Assessment:   Vulvar ulceration H/o HSV  Plan:   I will have her take acyclovir 400 mg TID x 10 days then return to 1 tablet daily.

## 2022-12-13 NOTE — Telephone Encounter (Signed)
LVM to call back to schedule [facility] to go over medications to make sure pt is taking the correct ones!

## 2022-12-14 ENCOUNTER — Ambulatory Visit: Payer: Medicare Other

## 2022-12-17 IMAGING — US US ABDOMEN LIMITED RUQ/ASCITES
1 series · 14 of 25 positions shown · non-contrast
Comparison: CT abdomen pelvis 05/24/2019

CLINICAL DATA: Elevated liver function tests

EXAM:
ULTRASOUND ABDOMEN LIMITED RIGHT UPPER QUADRANT

[Series 1: us abdomen limited ruq/ascites · 0.19mm/px · 14 of 55 slices shown]
[im 1/55]
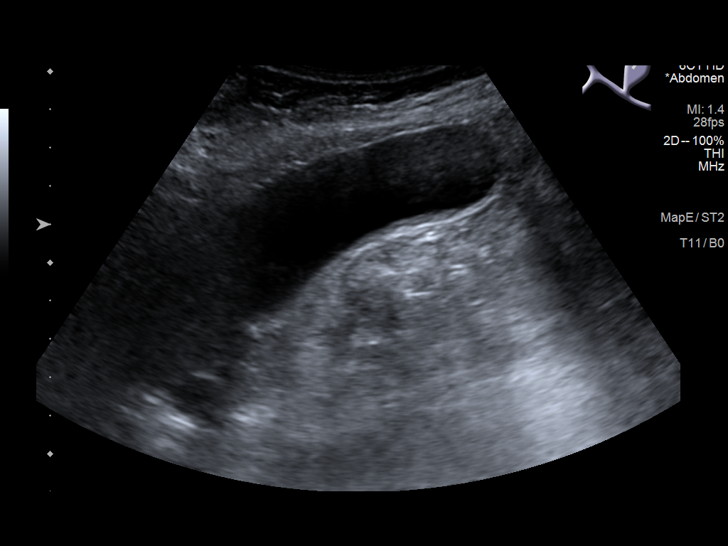
[im 5/55]
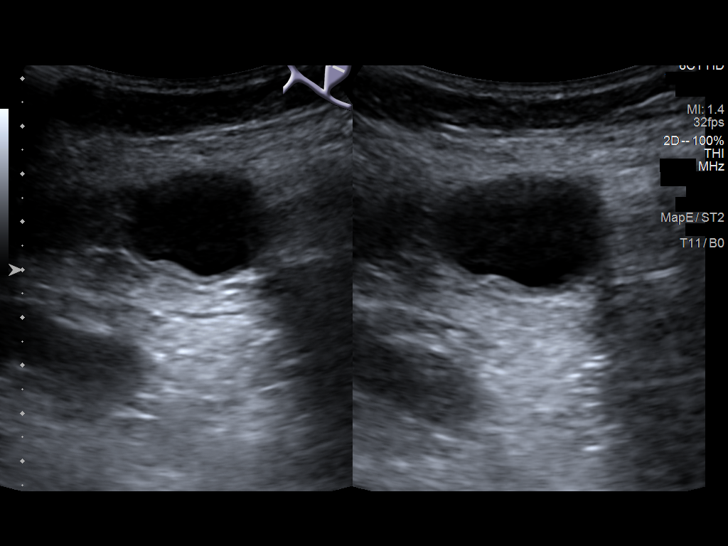
[im 10/55]
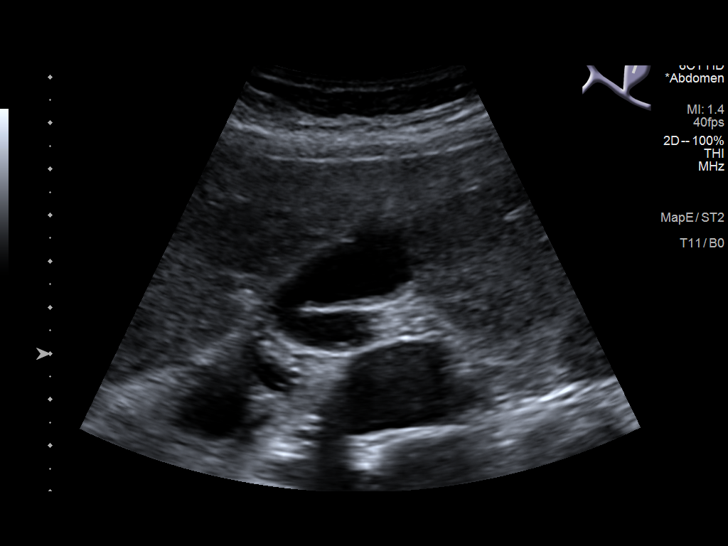
[im 14/55]
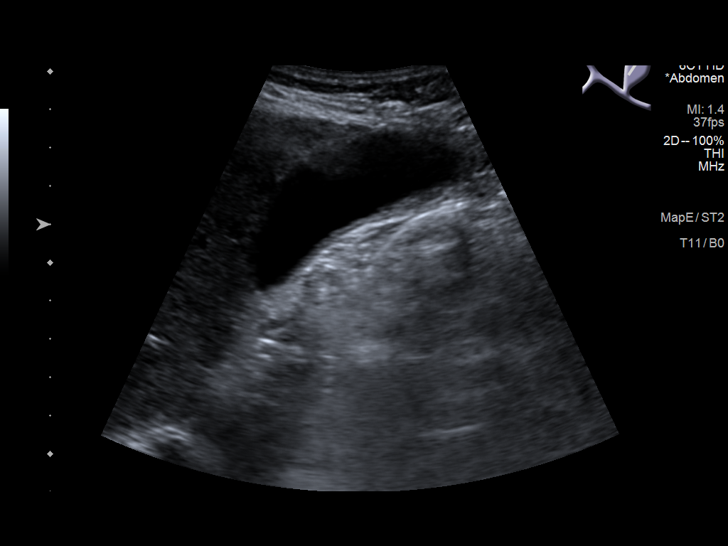
[im 19/55]
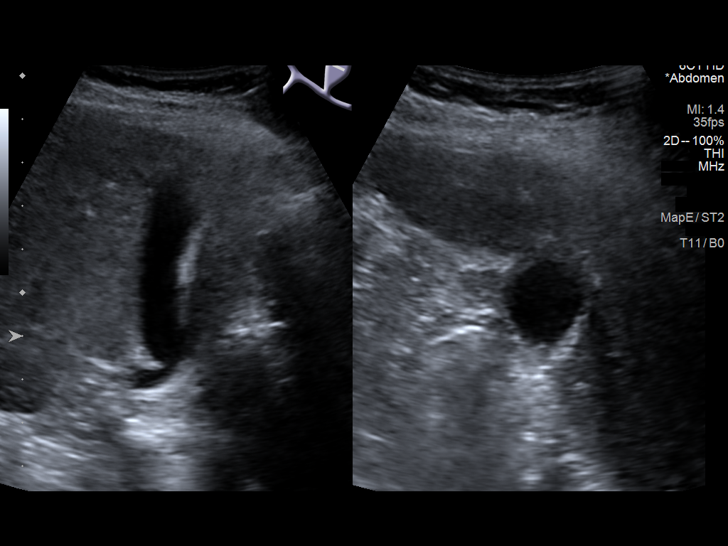
[im 21/55]
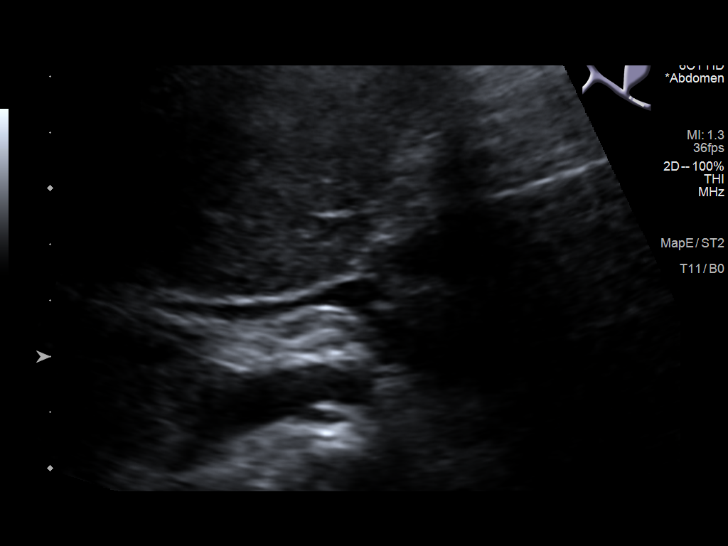
[im 25/55]
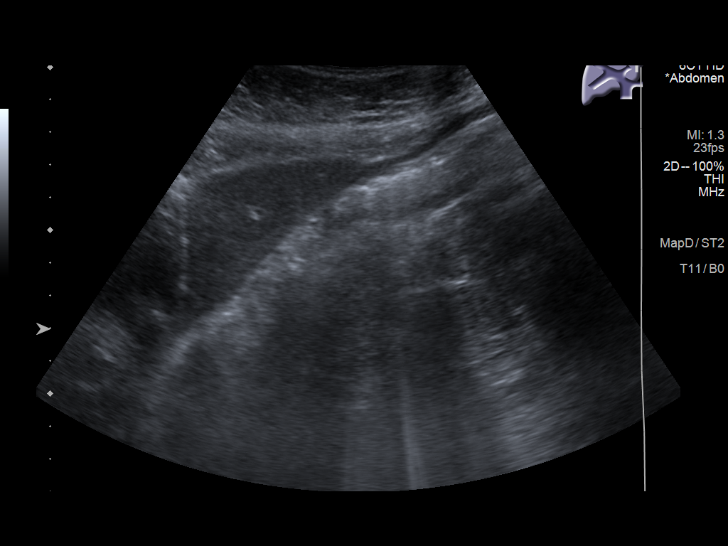
[im 30/55]
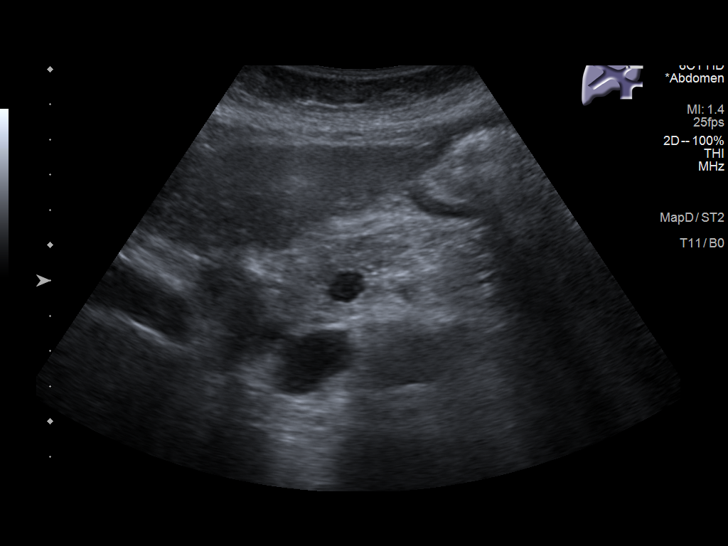
[im 34/55]
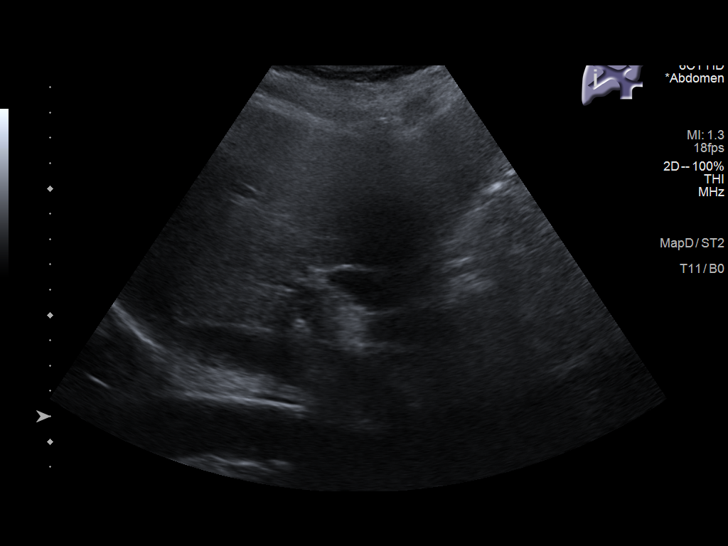
[im 37/55]
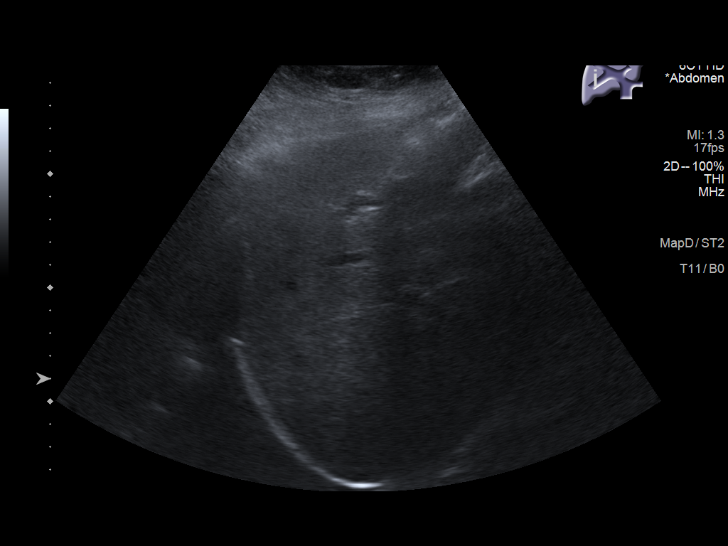
[im 41/55]
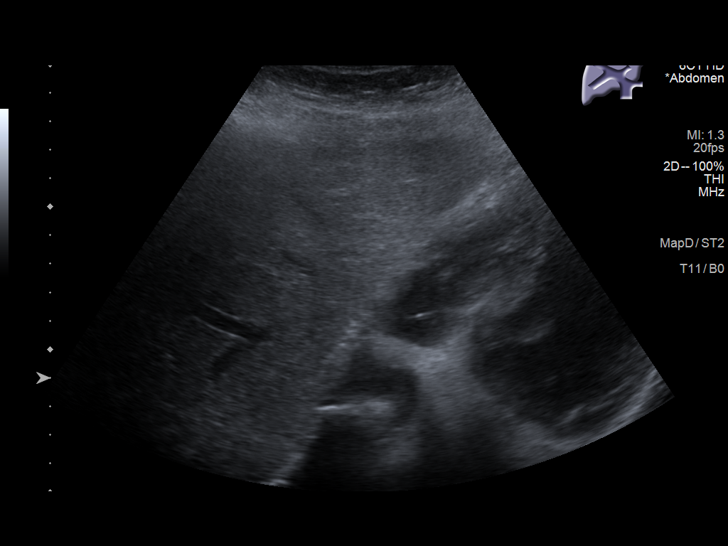
[im 46/55]
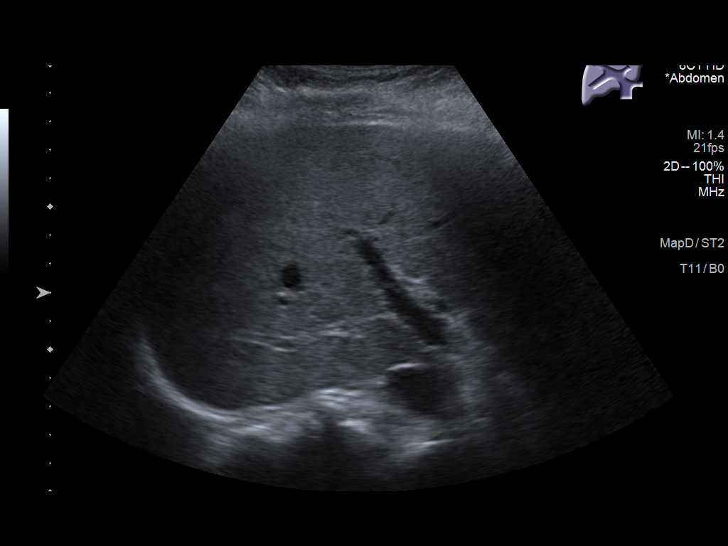
[im 50/55]
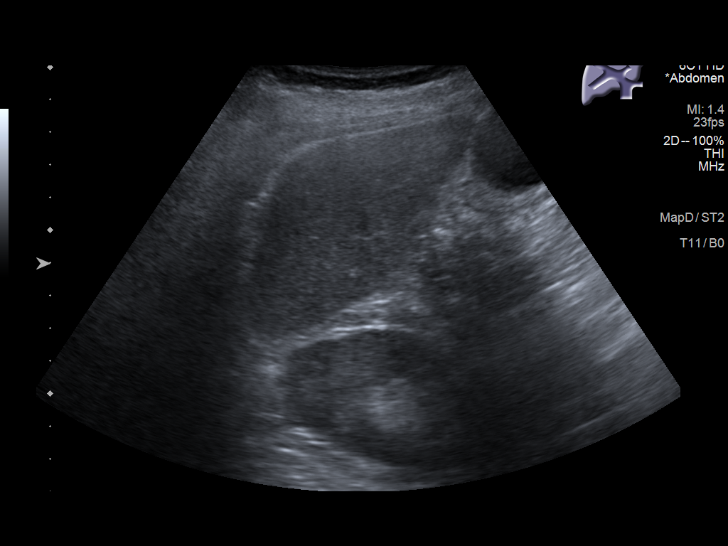
[im 55/55]
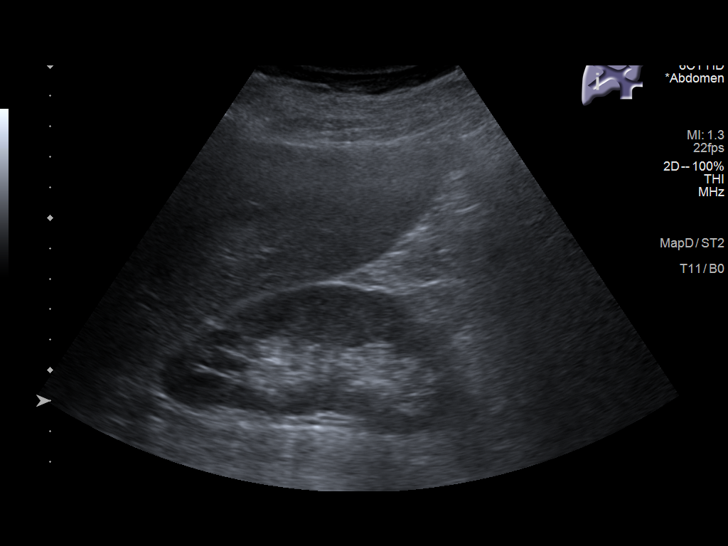

[14 of 25 positions shown; findings below may reference images not displayed]

FINDINGS: Gallbladder:

Minimal sludge seen within the gallbladder lumen. No gallstones or
wall thickening visualized. No sonographic Murphy sign noted by
sonographer.

Common bile duct:

Diameter: 3 mm

Liver:

No focal lesion.

Diffusely increased parenchymal echogenicity.

Portal vein is patent on color Doppler imaging with normal direction
of blood flow towards the liver.

Other: None.
IMPRESSION: 1. Diffuse increased echogenicity of the hepatic parenchyma is a
nonspecific indicator of hepatocellular dysfunction, most commonly
steatosis.
2. Minimal gallbladder sludge without evidence of acute
cholecystitis.

## 2022-12-19 ENCOUNTER — Inpatient Hospital Stay: Payer: Medicare Other | Attending: Oncology

## 2022-12-19 DIAGNOSIS — D508 Other iron deficiency anemias: Secondary | ICD-10-CM | POA: Diagnosis not present

## 2022-12-19 DIAGNOSIS — D500 Iron deficiency anemia secondary to blood loss (chronic): Secondary | ICD-10-CM

## 2022-12-19 LAB — CBC WITH DIFFERENTIAL (CANCER CENTER ONLY)
Abs Immature Granulocytes: 0.01 10*3/uL (ref 0.00–0.07)
Basophils Absolute: 0 10*3/uL (ref 0.0–0.1)
Basophils Relative: 1 %
Eosinophils Absolute: 0.1 10*3/uL (ref 0.0–0.5)
Eosinophils Relative: 2 %
HCT: 36.4 % (ref 36.0–46.0)
Hemoglobin: 11.5 g/dL — ABNORMAL LOW (ref 12.0–15.0)
Immature Granulocytes: 0 %
Lymphocytes Relative: 40 %
Lymphs Abs: 2.2 10*3/uL (ref 0.7–4.0)
MCH: 27.3 pg (ref 26.0–34.0)
MCHC: 31.6 g/dL (ref 30.0–36.0)
MCV: 86.5 fL (ref 80.0–100.0)
Monocytes Absolute: 0.5 10*3/uL (ref 0.1–1.0)
Monocytes Relative: 10 %
Neutro Abs: 2.6 10*3/uL (ref 1.7–7.7)
Neutrophils Relative %: 47 %
Platelet Count: 161 10*3/uL (ref 150–400)
RBC: 4.21 MIL/uL (ref 3.87–5.11)
RDW: 14.3 % (ref 11.5–15.5)
WBC Count: 5.5 10*3/uL (ref 4.0–10.5)
nRBC: 0 % (ref 0.0–0.2)

## 2022-12-19 LAB — CMP (CANCER CENTER ONLY)
ALT: 40 U/L (ref 0–44)
AST: 60 U/L — ABNORMAL HIGH (ref 15–41)
Albumin: 4.3 g/dL (ref 3.5–5.0)
Alkaline Phosphatase: 105 U/L (ref 38–126)
Anion gap: 10 (ref 5–15)
BUN: 20 mg/dL (ref 8–23)
CO2: 30 mmol/L (ref 22–32)
Calcium: 9 mg/dL (ref 8.9–10.3)
Chloride: 98 mmol/L (ref 98–111)
Creatinine: 0.93 mg/dL (ref 0.44–1.00)
GFR, Estimated: >60 mL/min (ref >60)
Glucose, Bld: 103 mg/dL — ABNORMAL HIGH (ref 70–99)
Potassium: 4.2 mmol/L (ref 3.5–5.1)
Sodium: 138 mmol/L (ref 135–145)
Total Bilirubin: 0.5 mg/dL (ref <1.2)
Total Protein: 7.2 g/dL (ref 6.5–8.1)

## 2022-12-19 LAB — IRON AND TIBC
Iron: 66 ug/dL (ref 28–170)
Saturation Ratios: 18 % (ref 10.4–31.8)
TIBC: 377 ug/dL (ref 250–450)
UIBC: 311 ug/dL

## 2022-12-19 LAB — RETIC PANEL
Immature Retic Fract: 7 % (ref 2.3–15.9)
RBC.: 4.18 MIL/uL (ref 3.87–5.11)
Retic Count, Absolute: 35.9 10*3/uL (ref 19.0–186.0)
Retic Ct Pct: 0.9 % (ref 0.4–3.1)
Reticulocyte Hemoglobin: 30.5 pg (ref >27.9)

## 2022-12-19 LAB — FOLATE: Folate: 19.5 ng/mL (ref >5.9)

## 2022-12-19 LAB — FERRITIN: Ferritin: 70 ng/mL (ref 11–307)

## 2022-12-20 ENCOUNTER — Inpatient Hospital Stay (HOSPITAL_BASED_OUTPATIENT_CLINIC_OR_DEPARTMENT_OTHER): Payer: Medicare Other | Admitting: Oncology

## 2022-12-20 ENCOUNTER — Inpatient Hospital Stay: Payer: Medicare Other

## 2022-12-20 ENCOUNTER — Encounter: Payer: Self-pay | Admitting: Oncology

## 2022-12-20 VITALS — BP 105/64 | HR 57 | Temp 97.8°F | Resp 18 | Wt 172.7 lb

## 2022-12-20 DIAGNOSIS — R7401 Elevation of levels of liver transaminase levels: Secondary | ICD-10-CM

## 2022-12-20 DIAGNOSIS — Z9884 Bariatric surgery status: Secondary | ICD-10-CM | POA: Diagnosis not present

## 2022-12-20 DIAGNOSIS — D508 Other iron deficiency anemias: Secondary | ICD-10-CM | POA: Diagnosis not present

## 2022-12-20 DIAGNOSIS — D500 Iron deficiency anemia secondary to blood loss (chronic): Secondary | ICD-10-CM | POA: Diagnosis not present

## 2022-12-20 NOTE — Assessment & Plan Note (Signed)
Continue bariatric multivitamin.  ?

## 2022-12-20 NOTE — Assessment & Plan Note (Signed)
Likely due to alcohol use, improved after alcohol cessation.  Monitor.

## 2022-12-20 NOTE — Progress Notes (Signed)
Pt here for follow up. Reports feeling tired.

## 2022-12-20 NOTE — Progress Notes (Signed)
Hematology/Oncology Progress note [phone] [phone]         Patient Care Team: Allegra Grana, FNP as PCP - General (Family Medicine) Linna Darner, RD as Dietitian (Family Medicine) Rickard Patience, MD as Consulting Physician (Hematology and Oncology)  ASSESSMENT & PLAN:   IDA (iron deficiency anemia) #Iron deficiency anemia due to chronic blood loss. Labs reviewed and discussed with patient. Lab Results  Component Value Date   HGB 11.5 (L) 12/19/2022   TIBC 377 12/19/2022   IRONPCTSAT 18 12/19/2022   FERRITIN 70 12/19/2022   Hold off Venofer    History of bariatric surgery Continue bariatric multivitamin  Transaminitis Likely due to alcohol use, improved after alcohol cessation.  Monitor.     Orders Placed This Encounter  Procedures   CBC with Differential (Cancer Center Only)    Standing Status:   Future    Standing Expiration Date:   12/20/2023   Iron and TIBC    Standing Status:   Future    Standing Expiration Date:   12/20/2023   Ferritin    Standing Status:   Future    Standing Expiration Date:   12/20/2023   Vitamin B12    Standing Status:   Future    Standing Expiration Date:   12/20/2023   Follow up in 6 months All questions were answered. The patient knows to call the clinic with any problems, questions or concerns.  Rickard Patience, MD, PhD [facility] Hematology Oncology 12/20/2022   CHIEF COMPLAINTS/REASON FOR VISIT:  Follow-up for iron deficiency anemia.  HISTORY OF PRESENTING ILLNESS:   Akhia [name] is a  72 y.o.  female with PMH listed below was seen in consultation at the request of  Allegra Grana, FNP  for evaluation of iron deficiency anemia  Patient was admitted from 02/05/2021 - 02/06/2021 due to symptomatic anemia with bright red blood per rectum.  Initial hemoglobin at presentation was 5.9.  Patient was transfused with 2 units of PRBC.  Also received 1 dose of IV Venofer treatments.  Patient had a flexible  sigmoidoscopy by Dr. Timothy Lasso.  Findings of nonbleeding hemorrhoids.  Patient follows up with Dr. Tobi Bastos outpatient.  Patient was previously taking aspirin which has been discontinued.  Patient had hospitalization from 11/17/2020 - 11/19/2020 due to symptomatic anemia secondary to intermittent BPBPR/melena.  She received blood transfusion during that hospitalization.  She also had a reaction to ferrous gluconate. She had EGD and colonoscopy on 11/19/2020 with no notable findings of active bleeding.   Patient has a history of bariatric surgery.  05/03/2022 -05/05/2022 Admitted due to melena. Acute blood loss anemia. underwent CT angio GI bleed with no active GI hemorrhage but liver steatosis.  status post upper and lower endoscopy. Lower endoscopy showed internal hemorrhoids and upper endoscopy was within normal limits.   INTERVAL HISTORY Jennifer [name] is a 72 y.o. female who has above history reviewed by me today presents for follow up visit for iron deficiency anemia.  She has stopped drinking alcohol  Feels well today. Denies hematochezia, hematuria, hematemesis, epistaxis, black tarry stool or easy bruising.    Review of Systems  Constitutional:  Negative for appetite change, chills, fatigue and fever.  HENT:   Negative for hearing loss and voice change.   Eyes:  Negative for eye problems.  Respiratory:  Negative for chest tightness and cough.   Cardiovascular:  Negative for chest pain.  Gastrointestinal:  Negative for abdominal distention, abdominal pain and blood in stool.  Endocrine:  Negative for hot flashes.  Genitourinary:  Negative for difficulty urinating and frequency.   Musculoskeletal:  Negative for arthralgias.  Skin:  Negative for itching and rash.  Neurological:  Negative for extremity weakness.  Hematological:  Negative for adenopathy.  Psychiatric/Behavioral:  Negative for confusion.     MEDICAL HISTORY:  Past Medical History:  Diagnosis Date   Anemia    Anxiety     Bariatric surgery status    Complication of anesthesia    Diificulty breathing for about 15 minutes after bariatric surgery   Constipation    COVID-19    Dysrhythmia    Elevated liver enzymes    GERD (gastroesophageal reflux disease)    Hemorrhoids    Herpes genitalis    High cholesterol    Hyperlipidemia    Hypertension    Hypothyroidism    IDA (iron deficiency anemia) 02/09/2021   Neuropathy    Osteoarthritis    Sleep apnea    CPAP    SURGICAL HISTORY: Past Surgical History:  Procedure Laterality Date   ABDOMINAL HYSTERECTOMY     total for fibroids no h/o abnormal pap   bariatric sleeve  2015   BREAST EXCISIONAL BIOPSY Left 1998   carpal tunnel repair     CATARACT EXTRACTION W/PHACO Right 11/01/2021   Procedure: CATARACT EXTRACTION PHACO AND INTRAOCULAR LENS PLACEMENT (IOC) RIGHT;  Surgeon: Nevada Crane, MD;  Location: [facility];  Service: Ophthalmology;  Laterality: Right;  sleep apnea 4.95 00:49.7   CATARACT EXTRACTION W/PHACO Left 11/15/2021   Procedure: CATARACT EXTRACTION PHACO AND INTRAOCULAR LENS PLACEMENT (IOC) LEFT 4.94 00:32.3;  Surgeon: Nevada Crane, MD;  Location: [facility];  Service: Ophthalmology;  Laterality: Left;  sleep apnea   COLONOSCOPY WITH PROPOFOL N/A 02/11/2016   Procedure: COLONOSCOPY WITH PROPOFOL;  Surgeon: Wyline Mood, MD;  Location: [facility] END[facility]COPY;  Service: Endoscopy;  Laterality: N/A;   COLONOSCOPY WITH PROPOFOL N/A 10/01/2019   Procedure: COLONOSCOPY WITH PROPOFOL;  Surgeon: Wyline Mood, MD;  Location: [facility] END[facility]COPY;  Service: Gastroenterology;  Laterality: N/A;   COLONOSCOPY WITH PROPOFOL N/A 11/19/2020   Procedure: COLONOSCOPY WITH PROPOFOL;  Surgeon: Wyline Mood, MD;  Location: [name] ENDOSCOPY;  Service: Gastroenterology;  Laterality: N/A;   COLONOSCOPY WITH PROPOFOL N/A 05/05/2022   Procedure: COLONOSCOPY WITH PROPOFOL;  Surgeon: Regis Bill, MD;  Location: [facility] ENDOSCOPY;  Service: Endoscopy;   Laterality: N/A;   ESOPHAGOGASTRODUODENOSCOPY (EGD) WITH PROPOFOL N/A 12/02/2019   Procedure: ESOPHAGOGASTRODUODENOSCOPY (EGD) WITH PROPOFOL;  Surgeon: Wyline Mood, MD;  Location: [facility] ENDOSCOPY;  Service: Gastroenterology;  Laterality: N/A;   ESOPHAGOGASTRODUODENOSCOPY (EGD) WITH PROPOFOL N/A 11/19/2020   Procedure: ESOPHAGOGASTRODUODENOSCOPY (EGD) WITH PROPOFOL;  Surgeon: Wyline Mood, MD;  Location: [facility] ENDOSCOPY;  Service: Gastroenterology;  Laterality: N/A;   ESOPHAGOGASTRODUODENOSCOPY (EGD) WITH PROPOFOL N/A 05/05/2022   Procedure: ESOPHAGOGASTRODUODENOSCOPY (EGD) WITH PROPOFOL;  Surgeon: Regis Bill, MD;  Location: [facility] ENDOSCOPY;  Service: Endoscopy;  Laterality: N/A;   FLEXIBLE SIGMOIDOSCOPY N/A 02/06/2021   Procedure: FLEXIBLE SIGMOIDOSCOPY;  Surgeon: Jaynie Collins, DO;  Location: [facility] ENDOSCOPY;  Service: Gastroenterology;  Laterality: N/A;   GIVENS CAPSULE STUDY N/A 06/07/2021   Procedure: GIVENS CAPSULE STUDY;  Surgeon: Toney Reil, MD;  Location: [facility] ENDOSCOPY;  Service: Gastroenterology;  Laterality: N/A;   GIVENS CAPSULE STUDY N/A 06/09/2021   Procedure: GIVENS CAPSULE STUDY;  Surgeon: Toney Reil, MD;  Location: [facility] ENDOSCOPY;  Service: Gastroenterology;  Laterality: N/A;   HEMORRHOID SURGERY     LAPAROSCOPIC GASTRIC RESTRICTIVE DUODENAL PROCEDURE (DUODENAL SWITCH)  Bilateral    2020    SOCIAL HISTORY: Social History   Socioeconomic History   Marital status: Married    Spouse name: Not on file   Number of children: Not on file   Years of education: Not on file   Highest education level: Some college, no degree  Occupational History   Not on file  Tobacco Use   Smoking status: Former    Current packs/day: 0.00    Average packs/day: 1.5 packs/day for 24.0 years (36.0 ttl pk-yrs)    Types: Cigarettes    Start date: 61    Quit date: 35    Years since quitting: 31.9   Smokeless tobacco: Never   Tobacco comments:    quit 1995.    Vaping Use   Vaping status: Never Used  Substance and Sexual Activity   Alcohol use: Not Currently    Alcohol/week: 14.0 standard drinks of alcohol    Types: 14 Glasses of wine per week   Drug use: No   Sexual activity: Not Currently    Birth control/protection: Surgical    Comment: Hysterectomy  Other Topics Concern   Not on file  Social History Narrative   Lives in [place].    Married.    Retired 2015, Engineer, structural.    One son; granddaughter.    Left handed    Caffeine- decaf coffee.    Social Determinants of Health   Financial Resource Strain: Low Risk  (11/21/2022)   Overall Financial Resource Strain (CARDIA)    Difficulty of Paying Living Expenses: Not hard at all  Food Insecurity: No Food Insecurity (11/21/2022)   Hunger Vital Sign    Worried About Running Out of Food in the Last Year: Never true    Ran Out of Food in the Last Year: Never true  Transportation Needs: No Transportation Needs (11/21/2022)   PRAPARE - Administrator, Civil Service (Medical): No    Lack of Transportation (Non-Medical): No  Physical Activity: Unknown (11/21/2022)   Exercise Vital Sign    Days of Exercise per Week: 0 days    Minutes of Exercise per Session: Not on file  Stress: No Stress Concern Present (11/21/2022)   [employer] of Occupational Health - Occupational Stress Questionnaire    Feeling of Stress : Only a little  Social Connections: Unknown (11/21/2022)   Social Connection and Isolation Panel [NHANES]    Frequency of Communication with Friends and Family: Patient declined    Frequency of Social Gatherings with Friends and Family: Patient declined    Attends Religious Services: Patient declined    Database administrator or Organizations: No    Attends Engineer, structural: Not on file    Marital Status: Married  Catering manager Violence: Not At Risk (05/04/2022)   Humiliation, Afraid, Rape, and Kick questionnaire    Fear of Current or  Ex-Partner: No    Emotionally Abused: No    Physically Abused: No    Sexually Abused: No    FAMILY HISTORY: Family History  Problem Relation Age of Onset   Hypertension Mother    Heart disease Father    Alcohol abuse Father    Breast cancer Sister 55       materal 1/2 sister   Hypertension Sister    Hypertension Brother     ALLERGIES:  is allergic to bee pollen, celecoxib, hydrocodone, pollen extract, sodium ferric gluconate [ferrous gluconate], nasacort [triamcinolone], and vicodin [hydrocodone-acetaminophen].  MEDICATIONS:  Current  Outpatient Medications  Medication Sig Dispense Refill   acetaminophen (TYLENOL) 325 MG tablet Take 2 tablets (650 mg total) by mouth every 6 (six) hours as needed for mild pain (or Fever >/= 101).     acyclovir (ZOVIRAX) 400 MG tablet Take 1 tablet (400 mg total) by mouth 3 (three) times daily. After completing this course, you may return to taking 1 tablet daily. 30 tablet 0   famotidine (PEPCID) 20 MG tablet Take 1 tablet (20 mg total) by mouth at bedtime as needed for heartburn or indigestion. 90 tablet 1   FLUoxetine (PROZAC) 20 MG capsule Take 20 mg by mouth daily.     fluticasone (FLONASE) 50 MCG/ACT nasal spray INSTILL 1 SPRAY INTO BOTH NOSTRILS DAILY 48 mL 0   folic acid (FOLVITE) 1 MG tablet Take 1 tablet (1 mg total) by mouth daily. 360 tablet 0   gabapentin (NEURONTIN) 100 MG capsule Take 100mg  with morning 900mg , take 100mg  at lunch and and 100mg  evening dose gabapentin 900mg . 180 capsule 3   gabapentin (NEURONTIN) 300 MG capsule Take 3 capsules (900 mg total) by mouth 2 (two) times daily. 180 capsule 3   hydrocortisone (ANUSOL-HC) 25 MG suppository Unwrap and insert 1 suppository rectally twice a day 12 suppository 1   levothyroxine (SYNTHROID) 50 MCG tablet Take 1 tablet (50 mcg total) by mouth daily. 90 tablet 3   losartan (COZAAR) 100 MG tablet TAKE 1 TABLET BY MOUTH EVERYDAY AT BEDTIME (Patient taking differently: Take 100 mg by mouth  daily. TAKE 1 TABLET BY MOUTH EVERYDAY AT BEDTIME) 90 tablet 3   Multiple Vitamin (MULTIVITAMIN WITH MINERALS) TABS tablet Take 1 tablet by mouth daily.     naltrexone (DEPADE) 50 MG tablet Take by mouth daily. Patient takes  one  50 mg tablet daily at the same time     omeprazole (PRILOSEC) 40 MG capsule Take 40 mg by mouth daily.     potassium chloride SA (KLOR-CON M) 20 MEQ tablet Take 1 tablet (20 mEq total) by mouth daily.     rosuvastatin (CRESTOR) 5 MG tablet Take by mouth.     sodium chloride (OCEAN) 0.65 % SOLN nasal spray Place 2 sprays into both nostrils as needed for congestion. 30 mL 2   torsemide (DEMADEX) 20 MG tablet Take 1 tablet (20 mg total) by mouth daily.     traZODone (DESYREL) 50 MG tablet TAKE 1 TABLET BY MOUTH EVERY DAY FOR SLEEP 90 tablet 3   triamterene-hydrochlorothiazide (MAXZIDE-25) 37.5-25 MG tablet Take 1 tablet by mouth daily.     No current facility-administered medications for this visit.     PHYSICAL EXAMINATION: ECOG PERFORMANCE STATUS: 1 - Symptomatic but completely ambulatory Vitals:   12/20/22 1326  BP: 105/64  Pulse: (!) 57  Resp: 18  Temp: 97.8 F (36.6 C)   Filed Weights   12/20/22 1326  Weight: 172 lb 11.2 oz (78.3 kg)    Physical Exam Constitutional:      General: She is not in acute distress. HENT:     Head: Normocephalic and atraumatic.  Eyes:     General: No scleral icterus. Cardiovascular:     Rate and Rhythm: Normal rate.  Pulmonary:     Effort: Pulmonary effort is normal. No respiratory distress.  Abdominal:     General: There is no distension.  Musculoskeletal:        General: Normal range of motion.     Cervical back: Normal range of motion and neck supple.  Skin:  Findings: No erythema or rash.  Neurological:     Mental Status: She is alert and oriented to person, place, and time. Mental status is at baseline.  Psychiatric:        Mood and Affect: Mood normal.     LABORATORY DATA:  I have reviewed the data  as listed    Latest Ref Rng & Units 12/19/2022   11:23 AM 10/27/2022    8:25 AM 09/07/2022    9:14 AM  CBC  WBC 4.0 - 10.5 K/uL 5.5  5.3  4.7   Hemoglobin 12.0 - 15.0 g/dL 91.4  78.2  95.6   Hematocrit 36.0 - 46.0 % 36.4  35.6  32.6   Platelets 150 - 400 K/uL 161  140  170       Latest Ref Rng & Units 12/19/2022   11:23 AM 11/25/2022    9:45 AM 09/07/2022    9:14 AM  CMP  Glucose 70 - 99 mg/dL 213  86  086   BUN 8 - 23 mg/dL 20  19  18    Creatinine 0.44 - 1.00 mg/dL 5.78  4.69  6.29   Sodium 135 - 145 mmol/L 138  134  133   Potassium 3.5 - 5.1 mmol/L 4.2  3.7  4.0   Chloride 98 - 111 mmol/L 98  97  99   CO2 22 - 32 mmol/L 30  31  26    Calcium 8.9 - 10.3 mg/dL 9.0  9.2  8.8   Total Protein 6.5 - 8.1 g/dL 7.2   6.9   Total Bilirubin <1.2 mg/dL 0.5   0.4   Alkaline Phos 38 - 126 U/L 105   102   AST 15 - 41 U/L 60   42   ALT 0 - 44 U/L 40   19     Lab Results  Component Value Date   IRON 66 12/19/2022   TIBC 377 12/19/2022   FERRITIN 70 12/19/2022     RADIOGRAPHIC STUDIES: I have personally reviewed the radiological images as listed and agreed with the findings in the report. No results found.

## 2022-12-20 NOTE — Assessment & Plan Note (Addendum)
#  Iron deficiency anemia due to chronic blood loss. Labs reviewed and discussed with patient. Lab Results  Component Value Date   HGB 11.5 (L) 12/19/2022   TIBC 377 12/19/2022   IRONPCTSAT 18 12/19/2022   FERRITIN 70 12/19/2022   Hold off Venofer

## 2022-12-21 ENCOUNTER — Ambulatory Visit: Payer: Medicare Other | Attending: Family

## 2022-12-21 DIAGNOSIS — R2681 Unsteadiness on feet: Secondary | ICD-10-CM | POA: Diagnosis not present

## 2022-12-21 DIAGNOSIS — M6281 Muscle weakness (generalized): Secondary | ICD-10-CM | POA: Diagnosis not present

## 2022-12-21 DIAGNOSIS — R262 Difficulty in walking, not elsewhere classified: Secondary | ICD-10-CM | POA: Insufficient documentation

## 2022-12-21 DIAGNOSIS — H0289 Other specified disorders of eyelid: Secondary | ICD-10-CM | POA: Diagnosis not present

## 2022-12-21 DIAGNOSIS — H04123 Dry eye syndrome of bilateral lacrimal glands: Secondary | ICD-10-CM | POA: Diagnosis not present

## 2022-12-21 DIAGNOSIS — Z961 Presence of intraocular lens: Secondary | ICD-10-CM | POA: Diagnosis not present

## 2022-12-21 NOTE — Telephone Encounter (Signed)
LVM to call back to schedule [facility] to go over medications to make sure she is taking correct ones

## 2022-12-21 NOTE — Therapy (Signed)
OUTPATIENT PHYSICAL THERAPY TREATMENT   Patient Name: Jennifer [name] MRN: [id number] DOB:[birth year], 72 y.o., female Today's Date: 12/21/2022   PCP: [name], FNP REFERRING PROVIDER: [name], FNP  END OF SESSION:  PT End of Session - 12/21/22 0838     Visit Number 15    Number of Visits 24    Date for PT Re-Evaluation 01/23/23    Authorization Type Medicare A&B; [employer] supplemental    Authorization Time Period 12/12/22-01/23/23 (6 weeks)    Progress Note Due on Visit 20    PT Start Time 0838    PT Stop Time 0927    PT Time Calculation (min) 49 min    Equipment Utilized During Treatment Gait belt    Activity Tolerance Patient tolerated treatment well    Behavior During Therapy WFL for tasks assessed/performed                       Past Medical History:  Diagnosis Date   Anemia    Anxiety    Bariatric surgery status    Complication of anesthesia    Diificulty breathing for about 15 minutes after bariatric surgery   Constipation    COVID-19    Dysrhythmia    Elevated liver enzymes    GERD (gastroesophageal reflux disease)    Hemorrhoids    Herpes genitalis    High cholesterol    Hyperlipidemia    Hypertension    Hypothyroidism    IDA (iron deficiency anemia) 02/09/2021   Neuropathy    Osteoarthritis    Sleep apnea    CPAP   Past Surgical History:  Procedure Laterality Date   ABDOMINAL HYSTERECTOMY     total for fibroids no h/o abnormal pap   bariatric sleeve  2015   BREAST EXCISIONAL BIOPSY Left 1998   carpal tunnel repair     CATARACT EXTRACTION W/PHACO Right 11/01/2021   Procedure: CATARACT EXTRACTION PHACO AND INTRAOCULAR LENS PLACEMENT (IOC) RIGHT;  Surgeon: Nevada Crane, MD;  Location: [facility];  Service: Ophthalmology;  Laterality: Right;  sleep apnea 4.95 00:49.7   CATARACT EXTRACTION W/PHACO Left 11/15/2021   Procedure: CATARACT EXTRACTION PHACO AND INTRAOCULAR LENS PLACEMENT (IOC) LEFT 4.94 00:32.3;   Surgeon: Nevada Crane, MD;  Location: [facility];  Service: Ophthalmology;  Laterality: Left;  sleep apnea   COLONOSCOPY WITH PROPOFOL N/A 02/11/2016   Procedure: COLONOSCOPY WITH PROPOFOL;  Surgeon: Wyline Mood, MD;  Location: [facility] ENDOSCOPY;  Service: Endoscopy;  Laterality: N/A;   COLONOSCOPY WITH PROPOFOL N/A 10/01/2019   Procedure: COLONOSCOPY WITH PROPOFOL;  Surgeon: Wyline Mood, MD;  Location: [facility] END[facility];  Service: Gastroenterology;  Laterality: N/A;   COLONOSCOPY WITH PROPOFOL N/A 11/19/2020   Procedure: COLONOSCOPY WITH PROPOFOL;  Surgeon: Wyline Mood, MD;  Location: [facility] ENDOSCOPY;  Service: Gastroenterology;  Laterality: N/A;   COLONOSCOPY WITH PROPOFOL N/A 05/05/2022   Procedure: COLONOSCOPY WITH PROPOFOL;  Surgeon: Regis Bill, MD;  Location: [facility] ENDOSCOPY;  Service: Endoscopy;  Laterality: N/A;   ESOPHAGOGASTRODUODENOSCOPY (EGD) WITH PROPOFOL N/A 12/02/2019   Procedure: ESOPHAGOGASTRODUODENOSCOPY (EGD) WITH PROPOFOL;  Surgeon: Wyline Mood, MD;  Location: [facility] ENDOSCOPY;  Service: Gastroenterology;  Laterality: N/A;   ESOPHAGOGASTRODUODENOSCOPY (EGD) WITH PROPOFOL N/A 11/19/2020   Procedure: ESOPHAGOGASTRODUODENOSCOPY (EGD) WITH PROPOFOL;  Surgeon: Wyline Mood, MD;  Location: [facility] ENDOSCOPY;  Service: Gastroenterology;  Laterality: N/A;   ESOPHAGOGASTRODUODENOSCOPY (EGD) WITH PROPOFOL N/A 05/05/2022   Procedure: ESOPHAGOGASTRODUODENOSCOPY (EGD) WITH PROPOFOL;  Surgeon: Regis Bill, MD;  Location: [facility] ENDOSCOPY;  Service: Endoscopy;  Laterality: N/A;   FLEXIBLE SIGMOIDOSCOPY N/A 02/06/2021   Procedure: FLEXIBLE SIGMOIDOSCOPY;  Surgeon: Jaynie Collins, DO;  Location: [facility] ENDOSCOPY;  Service: Gastroenterology;  Laterality: N/A;   GIVENS CAPSULE STUDY N/A 06/07/2021   Procedure: GIVENS CAPSULE STUDY;  Surgeon: Toney Reil, MD;  Location: [facility] ENDOSCOPY;  Service: Gastroenterology;  Laterality: N/A;   GIVENS CAPSULE STUDY N/A 06/09/2021    Procedure: GIVENS CAPSULE STUDY;  Surgeon: Toney Reil, MD;  Location: [facility] ENDOSCOPY;  Service: Gastroenterology;  Laterality: N/A;   HEMORRHOID SURGERY     LAPAROSCOPIC GASTRIC RESTRICTIVE DUODENAL PROCEDURE (DUODENAL SWITCH) Bilateral    2020   Patient Active Problem List   Diagnosis Date Noted   Friction blister 07/22/2022   Acute on chronic anemia 05/03/2022   Melena 05/03/2022   Alcohol use disorder 05/03/2022   Transaminitis 11/22/2021   Tinea pedis 08/31/2021   Upper GI bleed 06/06/2021   Eye discharge 02/16/2021   IDA (iron deficiency anemia) 02/09/2021   Acute blood loss anemia 02/06/2021   Elevated liver function tests    Bright red blood per rectum 02/05/2021   Open wound of left heel 01/20/2021   Anxiety and depression 11/25/2020   CPAP (continuous positive airway pressure) dependence 11/25/2020   Sinusitis 11/25/2020   Allergic conjunctivitis of both eyes and rhinitis 11/25/2020   GI bleed 11/17/2020   Chronic iron deficiency anemia    Symptomatic anemia 11/11/2020   Postop check 08/07/2020   Biliary sludge determined by ultrasound 07/02/2020   Right upper quadrant abdominal pain 06/10/2020   Right-sided thoracic back pain 05/18/2020   Muscle spasm 05/18/2020   Bilateral tinnitus 04/30/2020   Irregular heartbeat 02/25/2020   Diabetes mellitus without complication (HCC) 02/25/2020   Strain of right knee 02/10/2020   Aspirin long-term use 10/22/2019   Wound ballistics 08/29/2019   Displacement of lumbar intervertebral disc without myelopathy 08/29/2019   Helicobacter pylori gastrointestinal tract infection 08/29/2019   Hypercholesterolemia 08/29/2019   Phlebitis after infusion 06/26/2019   Superficial thrombophlebitis of left upper extremity 06/26/2019   BMI 38.0-38.9,adult 06/19/2019   Small bowel obstruction (HCC) 05/25/2019   Morbid obesity (HCC) 05/16/2019   Cervical pain (neck) 04/09/2019   Pedal edema 03/06/2019   Pre-operative clearance  03/06/2019   History of sleeve gastrectomy 01/23/2019   Chronic pain syndrome 10/03/2018   Obstructive sleep apnea 10/03/2018   Right shoulder pain 06/08/2018   Chronic venous insufficiency 05/24/2018   Lymphedema 05/24/2018   Lumbar spondylosis 05/17/2018   Leg swelling 05/09/2018   Neuropathy 01/24/2018   Insomnia 07/12/2017   Fracture of phalanx of finger 05/31/2017   Impingement syndrome of shoulder region 05/31/2017   Hemorrhoids 12/15/2016   Hypothyroidism 10/03/2016   Chronic shoulder bursitis 08/30/2016   Positive ANA (antinuclear antibody) 08/30/2016   Bradycardia 06/16/2016   History of adenomatous polyp of colon 05/02/2016   Elevated liver enzymes 04/06/2016   OSA (obstructive sleep apnea) 04/06/2016   Bilateral shoulder pain 01/13/2016   Fatty liver 12/08/2015   Arthritis 12/08/2015   History of bariatric surgery 12/07/2015   Genital herpes 11/11/2015   Hyperlipidemia 11/11/2015   Essential hypertension 11/11/2015   Routine physical examination 11/11/2015   Allergic rhinitis 11/11/2015   GERD (gastroesophageal reflux disease) 11/11/2015   Herpesviral infection, unspecified 01/08/2014   Disorder of thyroid, unspecified 01/08/2014   Lumbar radiculitis 11/19/2013   Neuritis or radiculitis due to rupture of lumbar intervertebral disc 11/19/2013   Monilial vaginitis 08/20/2013   Gastritis and duodenitis 07/25/2013  Impaired fasting glucose 07/17/2012   Obesity, unspecified 07/17/2012   Obesity 07/17/2012   Depression 03/15/2012   Abnormal electrocardiogram (ECG) (EKG) 03/06/2012   URI (upper respiratory infection) 01/16/2012    ONSET DATE: 1 and 1/2 years ago  REFERRING DIAG: G62.9 (ICD-10-CM) - Neuropathy   THERAPY DIAG:  Difficulty in walking, not elsewhere classified  Muscle weakness (generalized)  Unsteadiness on feet  Rationale for Evaluation and Treatment: Rehabilitation  SUBJECTIVE:                                                                                                                                                                                              SUBJECTIVE STATEMENT:  Patient reports doing okay- Has a couple of closed areas on her right lateral foot that she said her MD said was okay as long as it was closed.  She states she has appointment with Dr. Jason Coop on 12/10. Otherwise had a nice Thanksgiving and stated she cooked for 2 days for just herself and her husband.   Pt accompanied by: self  PERTINENT HISTORY: Dafne Torre is a 72 year old female with past medical History of iron deficiency anemia (receiving iron infusions and recent right heel ulcer (healed per chart) and extensive medical history as reported in above section. Paitent was seen by PCP and diagnosed with gait/balance impairments.   PAIN:  Are you having pain? A little   PRECAUTIONS: Fall  RED FLAGS: None   WEIGHT BEARING RESTRICTIONS: No  FALLS: Has patient fallen in last 6 months? No  LIVING ENVIRONMENT: Lives with: lives with their spouse Lives in: House/apartment Stairs: Yes: External: 3-4 steps; on left going up Has following equipment at home: Single point cane and Grab bars  PLOF: Independent  PATIENT GOALS: Improve my balance.   OBJECTIVE:   TODAY'S TREATMENT:                                                                                                                              DATE: 12/21/22   -Tandem stance  1st round- 20 sec each  LE; 2nd round 30 sec bilat  -Narrowed stance on foam with self ball toss against wall x 10 reps -Tandem stance on firm surface with ball toss against wall x10 each direction - Tandem stance on foam with ball toss x 10 each direction (mild unsteadiness yet no LOB)  -STS + calf raise (reaching at dry erase board and place a magnet letter as high on board as possible)  x10  -marching in place on firm surface 2.5 # AW x 20 (VC for slow/control) with no UE support -Wall slide with 3 sec  hold (CGA) x 12 reps-Patient reports "I can feel that."  PATIENT EDUCATION: Education details: Purpose of PT/plan of care. Pt educated throughout session about proper posture and technique with exercises. Improved exercise technique, movement at target joints, use of target muscles after min to mod verbal, visual, tactile cues.  Person educated: Patient Education method: Explanation Education comprehension: verbalized understanding  HOME EXERCISE PROGRAM: Access Code: [email or web address] URL: [email or web address] Date: 12/21/2022 Prepared by: Maureen Ralphs  Exercises - Wall Squat  - 3 x weekly - 2-3 sets - 10 reps - 3 hold - March in Place  - 3 x weekly - 3 sets - 10 reps - 2 hold - Standing Tandem Balance with Counter Support  - 3 x weekly - 3 sets - 30  hold *Issued RTB for home use Access Code: [email or web address] URL: [email or web address] Date: 09/20/2022 Prepared by: Maureen Ralphs  Exercises - Seated Hip Flexion March with Ankle Weights  - 1 x daily - 3 x weekly - 3 sets - 10 reps - Seated Long Arc Quad with Ankle Weight  - 1 x daily - 3 x weekly - 3 sets - 10 reps - Seated Hip Flexion and External Rotation  - 1 x daily - 3 x weekly - 3 sets - 10 reps - Seated Hip Internal Rotation with Resistance  - 1 x daily - 3 x weekly - 3 sets - 10 reps - Seated Hamstring Curl with Anchored Resistance  - 1 x daily - 3 x weekly - 3 sets - 10 reps - Sit to Stand with Arms Crossed  - 1 x daily - 3 x weekly - 3 sets - 10 reps - Standing Hip Abduction with Counter Support  - 1 x daily - 7 x weekly - 3 sets - 10 reps - 2 hold - Standing Knee Flexion with Counter Support  - 1 x daily - 7 x weekly - 3 sets - 10 reps - 2 hold - Standing Tandem Balance with Counter Support  - 1 x daily - 7 x weekly - 3 sets - 4 reps - 15 hold - Side Stepping with Counter Support  - 1 x daily - 7 x weekly - 3 sets - 10 reps - Heel Toe Raises with Counter Support  - 1 x daily - 7 x weekly - 3 sets  - 10 reps  GOALS: Goals reviewed with patient? Yes  SHORT TERM GOALS: Target date: 10/27/2022  Pt will be independent with HEP in order to improve strength and balance in order to decrease fall risk and improve function at home and work.  Baseline: EVAL- Patient has no formal HEP in place, 10/31: pt compliant with HEP Goal status: MET   LONG TERM GOALS: Target date: 12/08/2022   1.  Patient (> 8 years old) will complete five times sit to stand test in < 10 seconds indicating an increased LE strength and improved balance.  Baseline: EVAL=  27.61 sec 10/31: 13.8 sec Goal status: MET, REVISED  2.  Patient will increase FOTO score to equal to or greater than  54   to demonstrate statistically significant improvement in mobility and quality of life.  Baseline: EVAL= 40 10/31: 59.7819 Goal status: MET   3.  Patient will increase Berg Balance score by > 6 points to demonstrate decreased fall risk during functional activities. Baseline: EVAL: 42/56 10/31: 51/56 Goal status: MET   4.  Patient will reduce timed up and go to <13 seconds to reduce fall risk and demonstrate improved transfer/gait ability. Baseline: EVAL= 18.33 sec 10/31: 14.61s without cane 14.41s with cane; 12/12/22: 11sec c 3lb AW  Goal status: MET   5.  Patient will increase 10 meter walk test to >1.22m/s as to improve gait speed for better community ambulation and to reduce fall risk. Baseline: EVAL= 0.66 m/s 10/31: 1.06 m/s with cane, 1.12 m/s without cane  Goal status: MET  6. Patient will increase Functional Gait Assessment score to >20/30 as to reduce fall risk and improve dynamic gait safety with community ambulation. Baseline: 19 Goal status: NEW  ASSESSMENT:  CLINICAL IMPRESSION:  Patient continues to present with good overall motivation and mentioned having a couple of spots on her right lateral foot that are bothering her. Observed foot- some dark skin vs. Bruising and Instructed patient to have Dr. Jason Coop  look at her right foot at her upcoming MD visit. Today focused on dynamic standing balance and patient participated well- exhibiting some ankle and hip righting reactions with minimal to no LOB. She was provided more exercises to add to HEP to assist in progressing her narrowed/tandem standing and overall LE strengthening. Patient exhibited good recall and return demo well today without any significant issues. Pt will continue to benefit from skilled physical therapy intervention to address impairments, improve QOL, and attain therapy goals.      OBJECTIVE IMPAIRMENTS: Abnormal gait, decreased activity tolerance, decreased balance, decreased coordination, decreased endurance, decreased mobility, difficulty walking, decreased strength, impaired sensation, and pain.   ACTIVITY LIMITATIONS: carrying, lifting, bending, standing, squatting, stairs, transfers, and bathing  PARTICIPATION LIMITATIONS: cleaning, laundry, shopping, community activity, and yard work  PERSONAL FACTORS: 3+ comorbidities: HTN, anemia, DM  are also affecting patient's functional outcome.   REHAB POTENTIAL: Good  CLINICAL DECISION MAKING: Stable/uncomplicated  EVALUATION COMPLEXITY: Moderate  PLAN:  PT FREQUENCY: 1-2x/week  PT DURATION: 6 Weeks   PLANNED INTERVENTIONS: Therapeutic exercises, Therapeutic activity, Neuromuscular re-education, Balance training, Gait training, Patient/Family education, Self Care, Joint mobilization, Joint manipulation, Stair training, Vestibular training, Canalith repositioning, DME instructions, Dry Needling, Spinal manipulation, Spinal mobilization, Cryotherapy, Moist heat, Manual therapy, and Re-evaluation  PLAN FOR NEXT SESSION:    Instruct in LE strengthening and balance, balance activities incorporating single leg and narrow BOS, dynamic balance activities, add balance to HEP   9:51 AM, 12/21/22 Louis Meckel, PT Physical Therapist - [place] [facility]  Outpatient Physical Therapy- Main Campus [phone]

## 2022-12-22 ENCOUNTER — Telehealth: Payer: Self-pay | Admitting: Family

## 2022-12-22 NOTE — Telephone Encounter (Signed)
Copied from CRM [id number]. Topic: Medicare AWV >> Dec 22, 2022  1:48 PM Payton Doughty wrote: Reason for CRM: Called LVM 12/22/2022 to schedule Annual Wellness Visit  Verlee Rossetti; Care Guide Ambulatory Clinical Support [place] [facility] Direct Dial: [phone]

## 2022-12-23 ENCOUNTER — Ambulatory Visit: Payer: Medicare Other | Admitting: Physical Therapy

## 2022-12-23 NOTE — Telephone Encounter (Signed)
LVM to call back to schedule MCVV to go over medications

## 2022-12-26 ENCOUNTER — Telehealth: Payer: Medicare Other | Admitting: Primary Care

## 2022-12-26 ENCOUNTER — Telehealth: Payer: Self-pay | Admitting: Primary Care

## 2022-12-26 ENCOUNTER — Encounter: Payer: Self-pay | Admitting: Primary Care

## 2022-12-26 ENCOUNTER — Ambulatory Visit (INDEPENDENT_AMBULATORY_CARE_PROVIDER_SITE_OTHER): Payer: Medicare Other | Admitting: Podiatry

## 2022-12-26 ENCOUNTER — Encounter: Payer: Self-pay | Admitting: Podiatry

## 2022-12-26 VITALS — BP 123/61 | HR 63 | Ht 61.0 in | Wt 171.0 lb

## 2022-12-26 DIAGNOSIS — G4733 Obstructive sleep apnea (adult) (pediatric): Secondary | ICD-10-CM | POA: Diagnosis not present

## 2022-12-26 DIAGNOSIS — S90821A Blister (nonthermal), right foot, initial encounter: Secondary | ICD-10-CM | POA: Diagnosis not present

## 2022-12-26 NOTE — Telephone Encounter (Signed)
Pt states she does need the pressure moved up to a 13 or 14. She states this was something discussed during her video visit

## 2022-12-26 NOTE — Progress Notes (Addendum)
Virtual Visit via Video Note  I connected with [name] on 12/26/22 at 10:00 AM EST by a video enabled telemedicine application and verified that I am speaking with the correct person using two identifiers.  Location: Patient: Home Provider: Office    I discussed the limitations of evaluation and management by telemedicine and the availability of in person appointments. The patient expressed understanding and agreed to proceed.  History of Present Illness:  72 year old female, former smoker quit 1993 (36-pack-year history).  Past medical history significant for OSA, allergic rhinitis, hypertension, GERD, diabetes mellitus, hypothyroidism, chronic pain syndrome.   Previous [facility] pulmonary encounter: 05/12/2021 Patient presents today ffor over OSA follow-up. She was last seen in 201 by pulmonary NP.  She has history of obstructive sleep apnea. Sleep study in January 2018 showed mild OSA, AHI 14/1/hr (REM supine sleep 55.5). She is currently on CPAP at 10cm h20. She is wearing her CPAP every night but only getting 2 hours of use. She reports experiencing condensation in her nasal mask with use. Humidity level is currently at 4. She feels she is also not getting enough pressure. No residual daytime sleepiness.   Airview download 04/10/2021 - 05/09/2021 Usage 30/30 days (100%); 3 days (10%) greater than 4 hours Average usage 2 hours 7 minutes Pressure 10 cm H2O Air leaks 9.9 L/min (95%) AHI 0.4   10/05/2022 Patient presents today for overdue sleep follow-up. Hx mild sleep apnea, AHI 14/hour. It has been awhile since she has worn her CPAP. She reports getting too much moisture/condensation in her mask. She has tried adjusting humidification settings, currently set at level 4. Using nasal mask. CPAP machine is working. Feels pressure is not strong enough.  Airview download 09/04/22-10/03/22 Usage day 5/30 days (17%)  Average usage days used 1 hours 11 min Pressure 10cm h20 Airleaks 11L/min   AHI 0   OSA (obstructive sleep apnea) - Hx mild-moderate OSA, AHI 14/hour. CPAP titrated to optimal pressure 10cm h20. She has been struggling with toleration due to mask condensation. We adjusted humidification level from level 4 to a level 1. We will also change pressure settings to auto settings 10-12cm h20. Encourage patient aim to wear CPAP nightly 4-6 hours or longer. FU in 4-8 weeks or sooner if needed.    12/26/2022- Interim hx  Patient contacted today for virtual video visit. Patient has mild OSA, AHI 14/hour. CPAP titration study showed optimal pressure 10cm h20. During our last visit we adjusted humidification level from 4 down to a level 1. We also changed pressure settings to auto settings 10-12cm h20.   Feels like the pressure is not strong enough at times, especially when she first going to sleep at night. Her current RAMP time is set at 40, we have asked her to turn RAMP off.   Airview download 11/23/2022 - 12/22/2022 Usage days 29/30 days (97%): 15 days (50%) greater than 4 hours Average usage days used 3 hours 39 minutes Pressure 10 to 12 cm H2O (10.1 cm H2O-95%) Air leaks 21.9 L/min (95%) AHI 0.5    Observations/Objective:  Unable to connect to video visit   Assessment and Plan:  Mild-moderate OSA: Patient is more compliant with CPAP, feels pressure is not always strong enough especially when first going to sleep at night. Current pressure 10-12cm h2O; Residual AHI 0.5/hour. Ramp time is set at 40 mins, advised she try turning RAMP time off/ or changing to auto. No other changes needed at this time. Advised she continue to aim  to wear CPAP 4+ more hours per night.  Patient called our office back and would like pressure increased to either 13-14cm h20. Order has been placed auto settings 10-14cm h20.  Follow Up Instructions:   3 months with Waynetta Sandy NP  I discussed the assessment and treatment plan with the patient. The patient was provided an opportunity to ask  questions and all were answered. The patient agreed with the plan and demonstrated an understanding of the instructions.   The patient was advised to call back or seek an in-person evaluation if the symptoms worsen or if the condition fails to improve as anticipated.  I provided 20 minutes of non-face-to-face time during this encounter.   Glenford Bayley, NP

## 2022-12-26 NOTE — Patient Instructions (Signed)
Try turning RAMP time off on machine or you can set auto  Pressure settings look good, you have having no residual apneas  Follow-up 3 months or sooner if needed

## 2022-12-26 NOTE — Addendum Note (Signed)
Addended by: Glenford Bayley on: 12/26/2022 12:04 PM   Modules accepted: Orders

## 2022-12-26 NOTE — Telephone Encounter (Signed)
I have notified the patient. Nothing further needed. 

## 2022-12-26 NOTE — Telephone Encounter (Signed)
Ok we can try.

## 2022-12-27 ENCOUNTER — Encounter: Payer: Self-pay | Admitting: Family

## 2022-12-27 ENCOUNTER — Other Ambulatory Visit: Payer: Self-pay | Admitting: Family

## 2022-12-27 ENCOUNTER — Ambulatory Visit: Payer: Medicare Other | Admitting: Family

## 2022-12-27 VITALS — BP 112/80 | HR 79 | Temp 97.7°F | Ht 61.0 in | Wt 171.8 lb

## 2022-12-27 DIAGNOSIS — Z79899 Other long term (current) drug therapy: Secondary | ICD-10-CM | POA: Diagnosis not present

## 2022-12-27 DIAGNOSIS — G629 Polyneuropathy, unspecified: Secondary | ICD-10-CM

## 2022-12-27 DIAGNOSIS — I10 Essential (primary) hypertension: Secondary | ICD-10-CM | POA: Diagnosis not present

## 2022-12-27 DIAGNOSIS — F331 Major depressive disorder, recurrent, moderate: Secondary | ICD-10-CM | POA: Diagnosis not present

## 2022-12-27 NOTE — Progress Notes (Signed)
  Subjective:  Patient ID: Jennifer [name], female    DOB: [birth year],  MRN: [id number]  Chief Complaint  Patient presents with   Wound Check    "I have the same situation.  I have a bubble on the right heel.  It's oozing because I lanced it."    72 y.o. female presents with the above complaint. History confirmed with patient.  Blister is ulcerated again now on the medial heel  Objective:  Physical Exam: warm, good capillary refill,  strong palpable DP and nonpalpable PT pulses, and today right medial heel has a new large bulla and blistering        Assessment:   1. Blister of right heel, initial encounter      Plan:  Patient was evaluated and treated and all questions answered.  Lesion was prepared with Betadine and lanced and drained of all fluid today.  Recommend continuing local wound care with Betadine and dry sterile dressings daily.  Surgical shoe was dispensed to offload the wound.  She will follow-up me in 1 month to reevaluate.  If no improvement at that point would recommend advanced imaging.   No follow-ups on file.

## 2022-12-27 NOTE — Assessment & Plan Note (Signed)
Chronic, stable.  Extensive medication review today.  Continue losartan 100mg  qd, triamterene-hctz 37.5-25 mg qd, torsemide 20 mg qd, KCL 20 mEQ every day

## 2022-12-27 NOTE — Progress Notes (Signed)
Assessment & Plan:  Essential hypertension Assessment & Plan: Chronic, stable.  Extensive medication review today.  Continue losartan 100mg  qd, triamterene-hctz 37.5-25 mg qd, torsemide 20 mg qd, KCL 20 mEQ every day    Neuropathy Assessment & Plan: Chronic, stable. Continue Gabapentin 1000mg  qam, 100mg  at lunch , 1000mg  qpm.,  Tylenol arthritis 2 tablets prn. We discussed and agreed against transitioning to Lyrica at this time due to history of addiction. Will follow.       Return precautions given.   Risks, benefits, and alternatives of the medications and treatment plan prescribed today were discussed, and patient expressed understanding.   Education regarding symptom management and diagnosis given to patient on AVS either electronically or printed.  Return in about 3 months (around 03/27/2023).  [name], FNP  Subjective:    Patient ID: Jennifer [name], female    DOB: 1950-03-26, 72 y.o.   MRN: [id number]  CC: Jennifer [name] is a 72 y.o. female who presents today for follow up.   HPI: Feels well today.  No new complaints.  Recently following with podiatry for right foot wound. She is wearing boot today.      Compliant with losartan 100mg  qd, triamterene-hctz 37.5-25 mg qd, torsemide 20 mg qd, KCL 20 mEQ every day  Compliant Gabapentin 1000mg  qam, 100mg  at lunch , 1000mg  qpm.  She feels medication is overall helpful.  She will take Tylenol arthritis 2 tablets prn.  Very little relief from capsaicin cream OTC.  She does not feel overly sedated on regimen.  She remains sober since June of this year  Allergies: Bee pollen, Celecoxib, Hydrocodone, Pollen extract, Sodium ferric gluconate [ferrous gluconate], Nasacort [triamcinolone], and Vicodin [hydrocodone-acetaminophen] Current Outpatient Medications on File Prior to Visit  Medication Sig Dispense Refill   acetaminophen (TYLENOL) 325 MG tablet Take 2 tablets (650 mg total) by mouth every 6 (six) hours as  needed for mild pain (or Fever >/= 101).     acyclovir (ZOVIRAX) 400 MG tablet Take 1 tablet (400 mg total) by mouth 3 (three) times daily. After completing this course, you may return to taking 1 tablet daily. 30 tablet 0   famotidine (PEPCID) 20 MG tablet Take 1 tablet (20 mg total) by mouth at bedtime as needed for heartburn or indigestion. 90 tablet 1   FLUoxetine (PROZAC) 20 MG capsule Take 20 mg by mouth daily.     fluticasone (FLONASE) 50 MCG/ACT nasal spray INSTILL 1 SPRAY INTO BOTH NOSTRILS DAILY 48 mL 0   folic acid (FOLVITE) 1 MG tablet Take 1 tablet (1 mg total) by mouth daily. 360 tablet 0   gabapentin (NEURONTIN) 100 MG capsule Take 100mg  with morning 900mg , take 100mg  at lunch and and 100mg  evening dose gabapentin 900mg . 180 capsule 3   gabapentin (NEURONTIN) 300 MG capsule Take 3 capsules (900 mg total) by mouth 2 (two) times daily. 180 capsule 3   hydrocortisone (ANUSOL-HC) 25 MG suppository Unwrap and insert 1 suppository rectally twice a day 12 suppository 1   levothyroxine (SYNTHROID) 50 MCG tablet Take 1 tablet (50 mcg total) by mouth daily. 90 tablet 3   losartan (COZAAR) 100 MG tablet TAKE 1 TABLET BY MOUTH EVERYDAY AT BEDTIME (Patient taking differently: Take 100 mg by mouth daily. TAKE 1 TABLET BY MOUTH EVERYDAY AT BEDTIME) 90 tablet 3   Multiple Vitamin (MULTIVITAMIN WITH MINERALS) TABS tablet Take 1 tablet by mouth daily.     naltrexone (DEPADE) 50 MG tablet Take by mouth daily. Patient takes  one  50 mg tablet daily at the same time     omeprazole (PRILOSEC) 40 MG capsule Take 40 mg by mouth daily.     potassium chloride SA (KLOR-CON M) 20 MEQ tablet Take 1 tablet (20 mEq total) by mouth daily.     rosuvastatin (CRESTOR) 5 MG tablet Take 2.5 mg by mouth daily.     sodium chloride (OCEAN) 0.65 % SOLN nasal spray Place 2 sprays into both nostrils as needed for congestion. 30 mL 2   torsemide (DEMADEX) 20 MG tablet Take 1 tablet (20 mg total) by mouth daily.     traZODone  (DESYREL) 50 MG tablet TAKE 1 TABLET BY MOUTH EVERY DAY FOR SLEEP 90 tablet 3   triamterene-hydrochlorothiazide (MAXZIDE-25) 37.5-25 MG tablet Take 1 tablet by mouth daily.     No current facility-administered medications on file prior to visit.    Review of Systems  Constitutional:  Negative for chills and fever.  Respiratory:  Negative for cough.   Cardiovascular:  Negative for chest pain and palpitations.  Gastrointestinal:  Negative for nausea and vomiting.  Neurological:  Positive for numbness.      Objective:    BP 112/80   Pulse 79   Temp 97.7 F (36.5 C) (Oral)   Ht 5\' 1"  (1.549 m)   Wt 171 lb 12.8 oz (77.9 kg)   SpO2 97%   BMI 32.46 kg/m  BP Readings from Last 3 Encounters:  12/27/22 112/80  12/26/22 123/61  12/20/22 105/64   Wt Readings from Last 3 Encounters:  12/27/22 171 lb 12.8 oz (77.9 kg)  12/26/22 171 lb (77.6 kg)  12/20/22 172 lb 11.2 oz (78.3 kg)    Physical Exam Vitals reviewed.  Constitutional:      Appearance: She is well-developed.  Eyes:     Conjunctiva/sclera: Conjunctivae normal.  Neck:     Thyroid: No thyroid mass or thyromegaly.  Cardiovascular:     Rate and Rhythm: Normal rate and regular rhythm.     Pulses: Normal pulses.     Heart sounds: Normal heart sounds.  Pulmonary:     Effort: Pulmonary effort is normal.     Breath sounds: Normal breath sounds. No wheezing, rhonchi or rales.  Lymphadenopathy:     Head:     Right side of head: No submental, submandibular, tonsillar, preauricular, posterior auricular or occipital adenopathy.     Left side of head: No submental, submandibular, tonsillar, preauricular, posterior auricular or occipital adenopathy.     Cervical: No cervical adenopathy.  Skin:    General: Skin is warm and dry.  Neurological:     Mental Status: She is alert.  Psychiatric:        Speech: Speech normal.        Behavior: Behavior normal.        Thought Content: Thought content normal.

## 2022-12-27 NOTE — Patient Instructions (Signed)
A pleasure seeing you today.  We reviewed your medications extensively.  Please ensure you read medication list and let us know if any inaccuracies

## 2022-12-27 NOTE — Assessment & Plan Note (Signed)
Chronic, stable. Continue Gabapentin 1000mg  qam, 100mg  at lunch , 1000mg  qpm.,  Tylenol arthritis 2 tablets prn. We discussed and agreed against transitioning to Lyrica at this time due to history of addiction. Will follow.

## 2022-12-27 NOTE — Telephone Encounter (Signed)
Pt was seen in office today and medications were handled and pt received correct updated medication lists

## 2022-12-28 ENCOUNTER — Ambulatory Visit: Payer: Medicare Other

## 2023-01-02 ENCOUNTER — Ambulatory Visit: Payer: Medicare Other | Admitting: Physical Therapy

## 2023-01-04 ENCOUNTER — Ambulatory Visit: Payer: Medicare Other

## 2023-01-12 ENCOUNTER — Ambulatory Visit: Payer: Medicare Other | Admitting: Physical Therapy

## 2023-01-16 ENCOUNTER — Ambulatory Visit: Payer: Medicare Other | Admitting: Physical Therapy

## 2023-01-17 ENCOUNTER — Ambulatory Visit: Payer: Medicare Other | Admitting: Obstetrics and Gynecology

## 2023-01-18 IMAGING — CR DG LUMBAR SPINE COMPLETE 4+V
1 series · 5 of 5 positions shown · non-contrast
Comparison: 07/04/2010

CLINICAL DATA: Chronic midline lower back pain radiating into the
right hip.

EXAM:
LUMBAR SPINE - COMPLETE 4+ VIEW

[Series 1: dg lumbar spine complete 4 +v · 0.14mm/px · 5 of 5 slices shown]
[im 1/5]
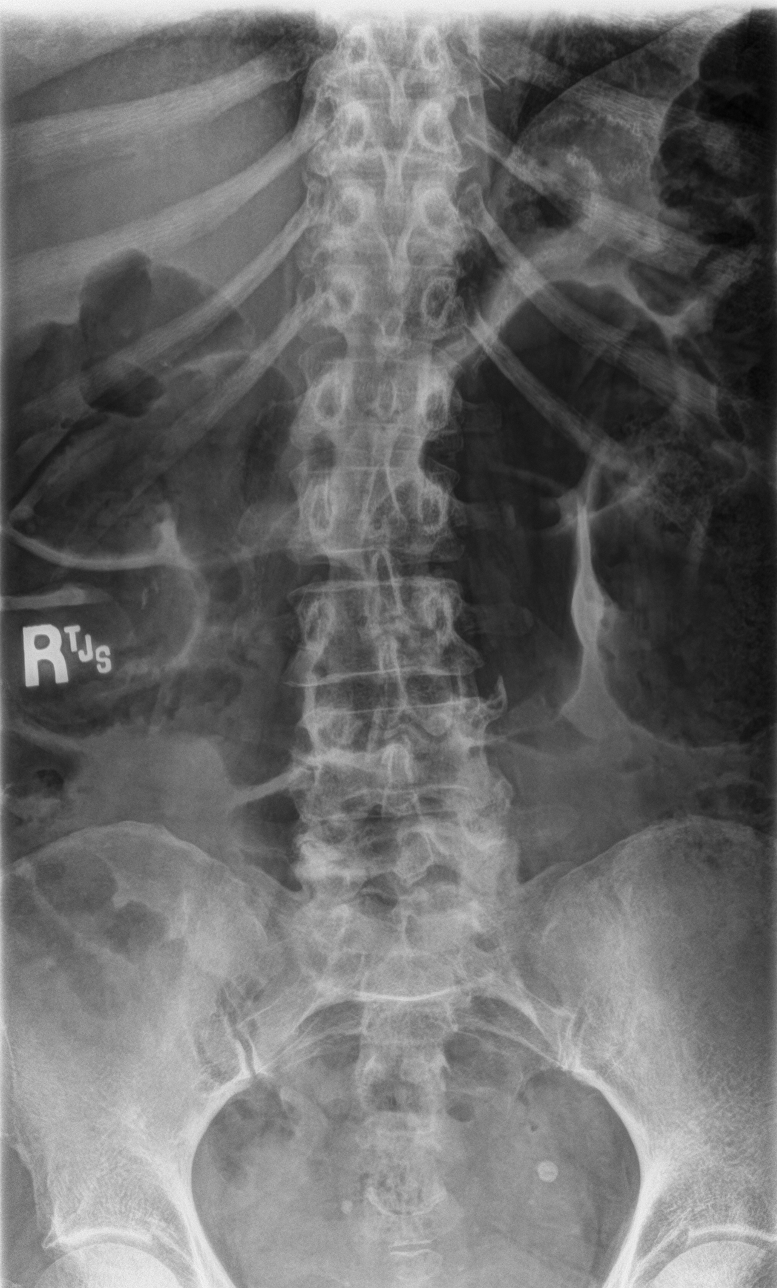
[im 2/5]
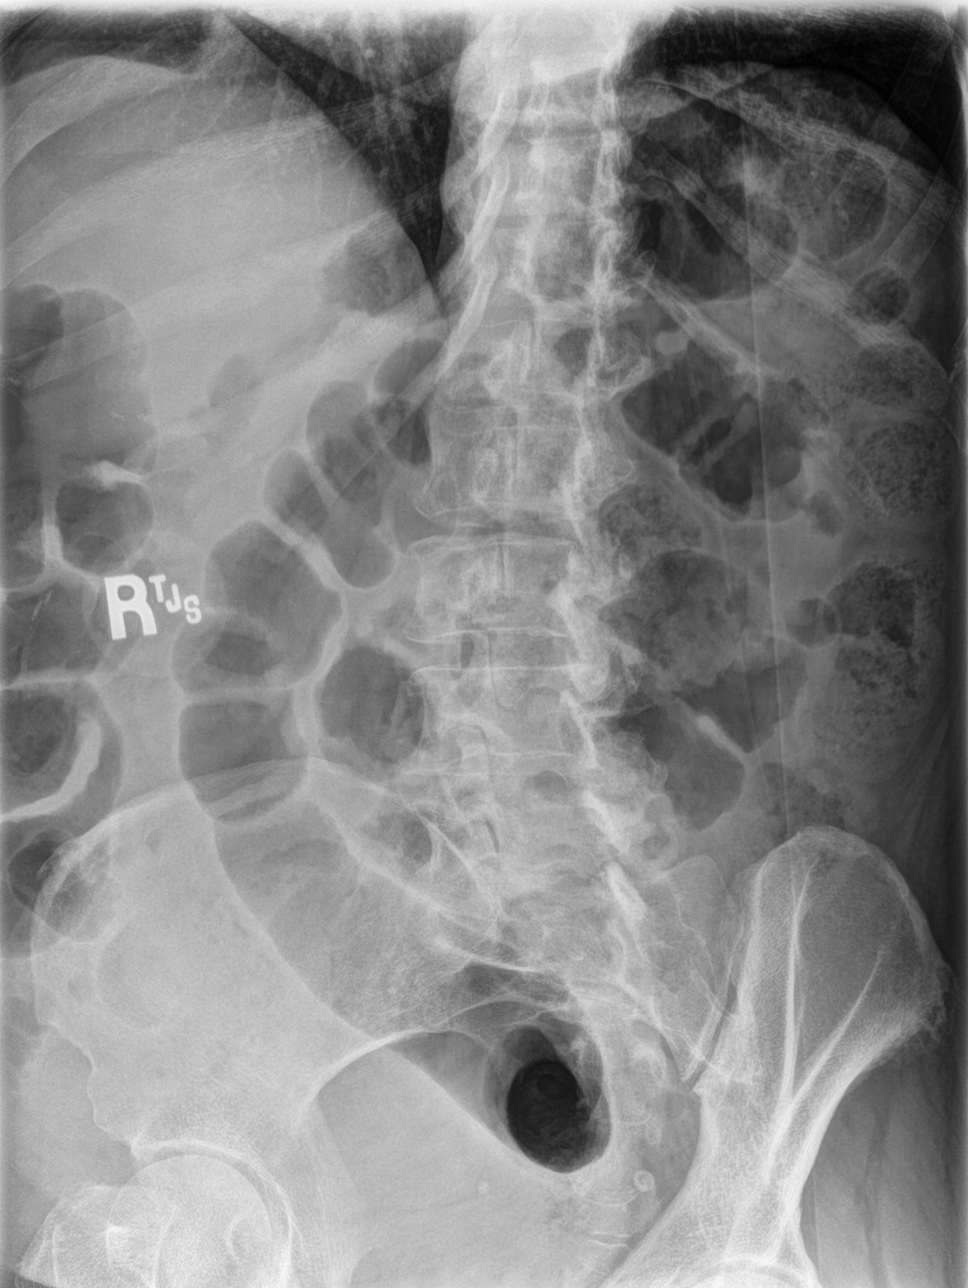
[im 3/5]
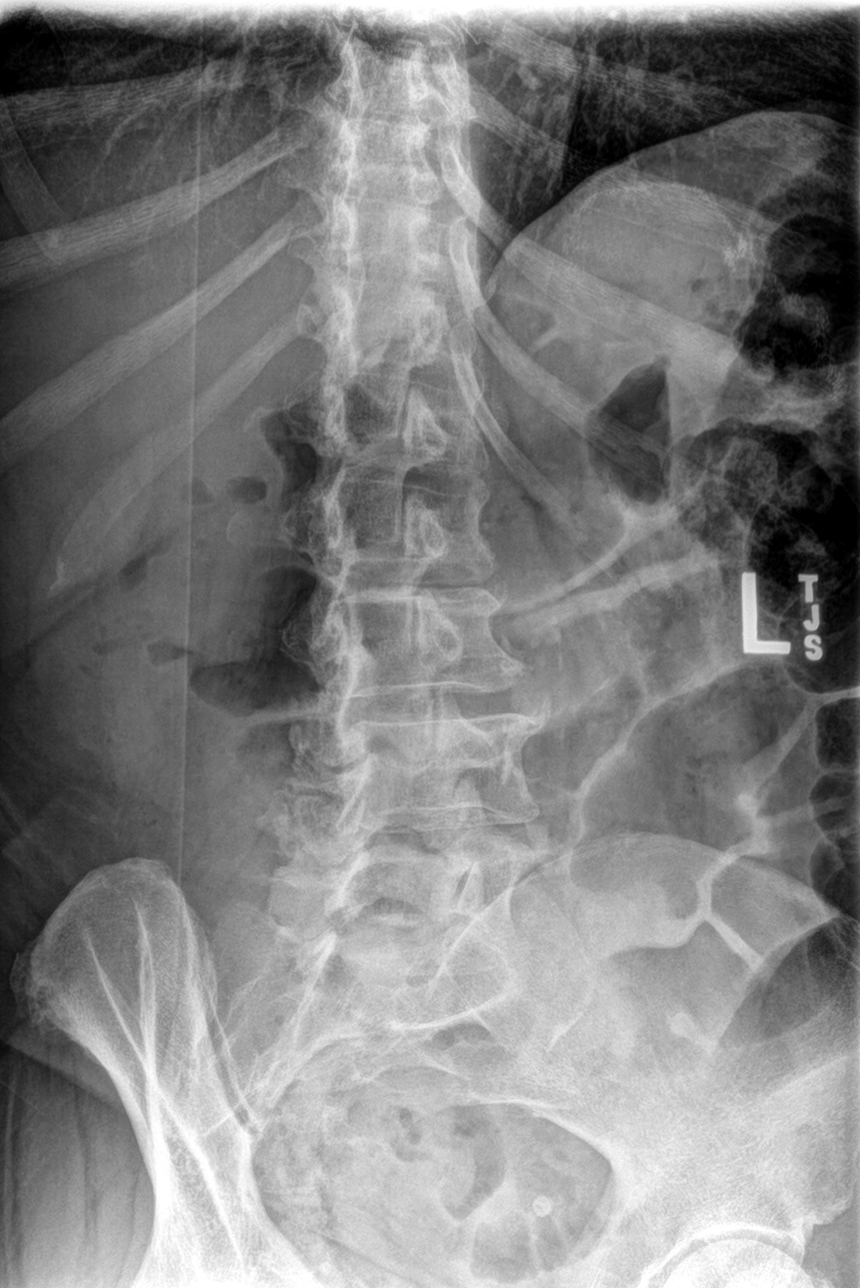
[im 4/5]
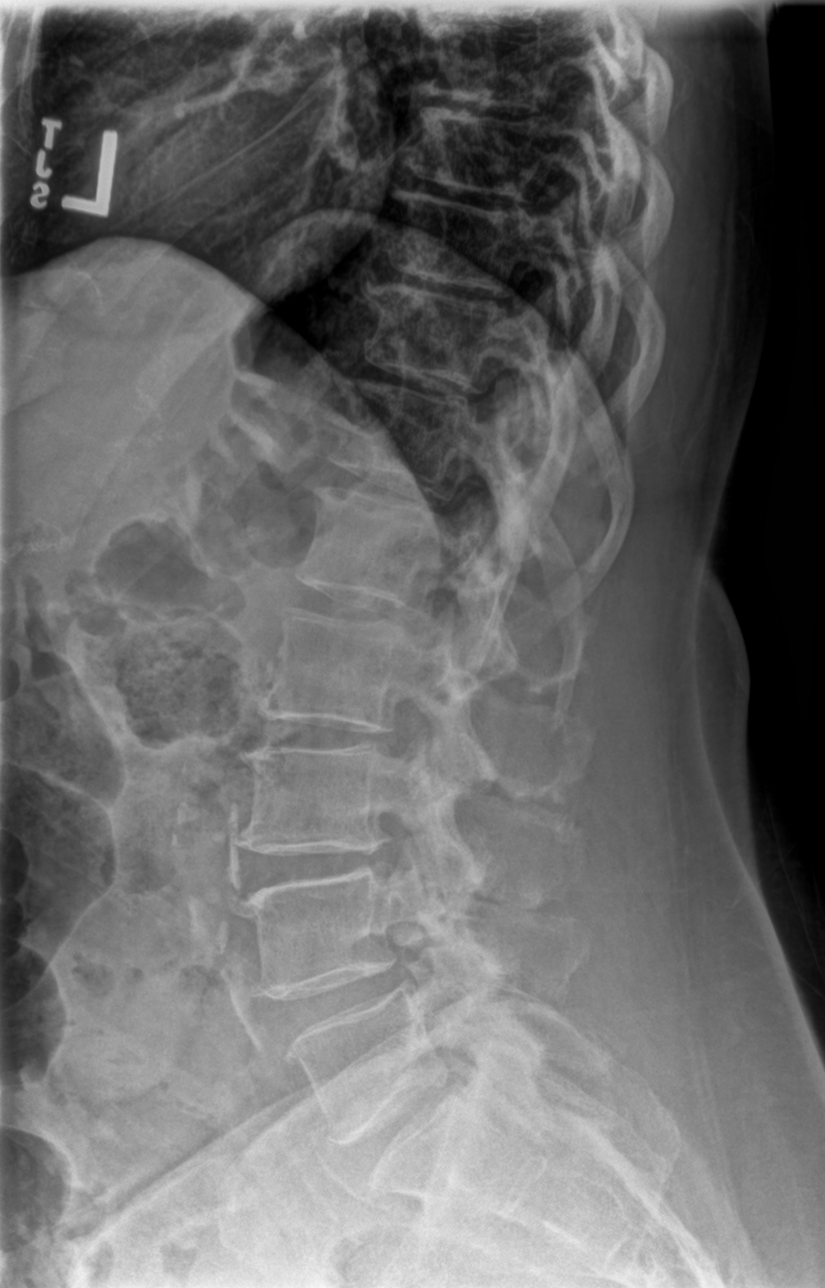
[im 5/5]
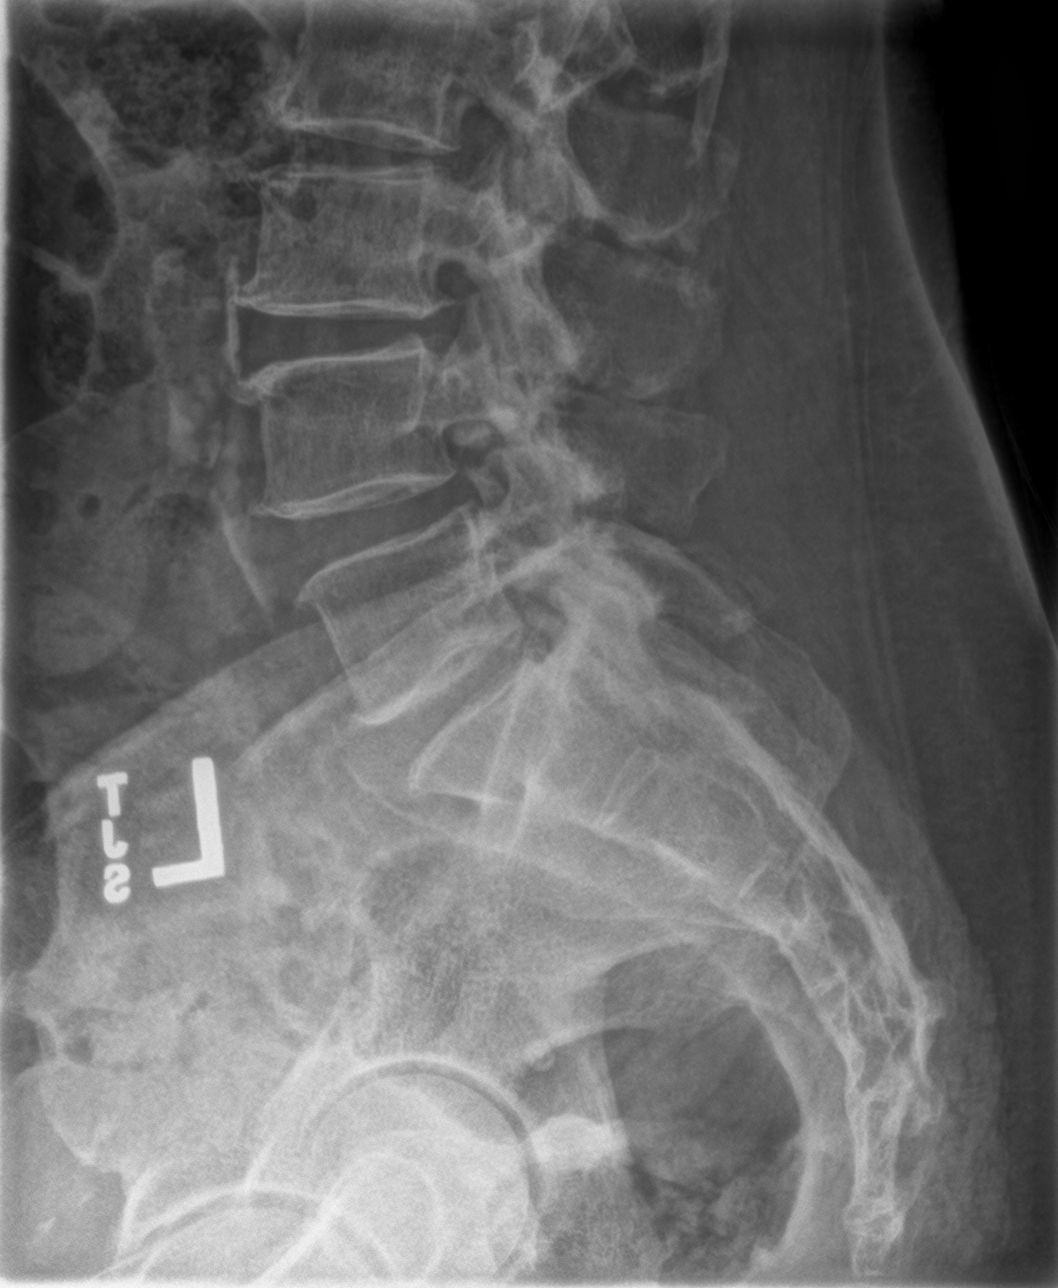

[5 of 5 positions shown; findings below may reference images not displayed]

FINDINGS: No fracture or bone lesion.  No spondylolisthesis.

Discs are relatively well maintained in height. There are small
endplate osteophytes from L2 through L5. Facet degenerative changes
are noted in lower lumbar spine, L3 through L5 on the right and L4
on the left.

Mild curvature, convex the right, apex at L2.

Scattered atherosclerotic calcifications along the abdominal aorta,
stable.
IMPRESSION: 1. No fracture or acute finding. No spondylolisthesis or bone
lesion.
2. Mild disc and facet changes as described, similar to the prior
exam.

## 2023-01-18 IMAGING — CR DG CERVICAL SPINE COMPLETE 4+V
1 series · 7 of 7 positions shown · non-contrast
Comparison: None.

CLINICAL DATA: Chronic neck pain

EXAM:
CERVICAL SPINE - COMPLETE 4+ VIEW

[Series 1: dg cervical spine complete · 0.14mm/px · 7 of 7 slices shown]
[im 1/7]
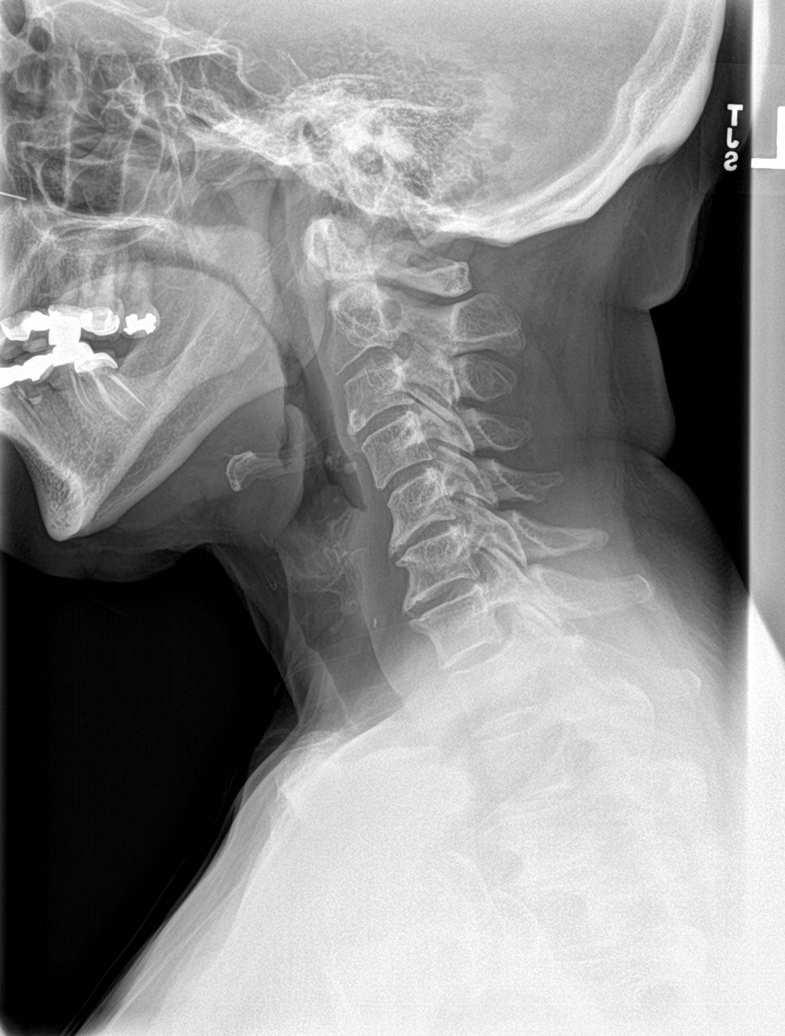
[im 2/7]
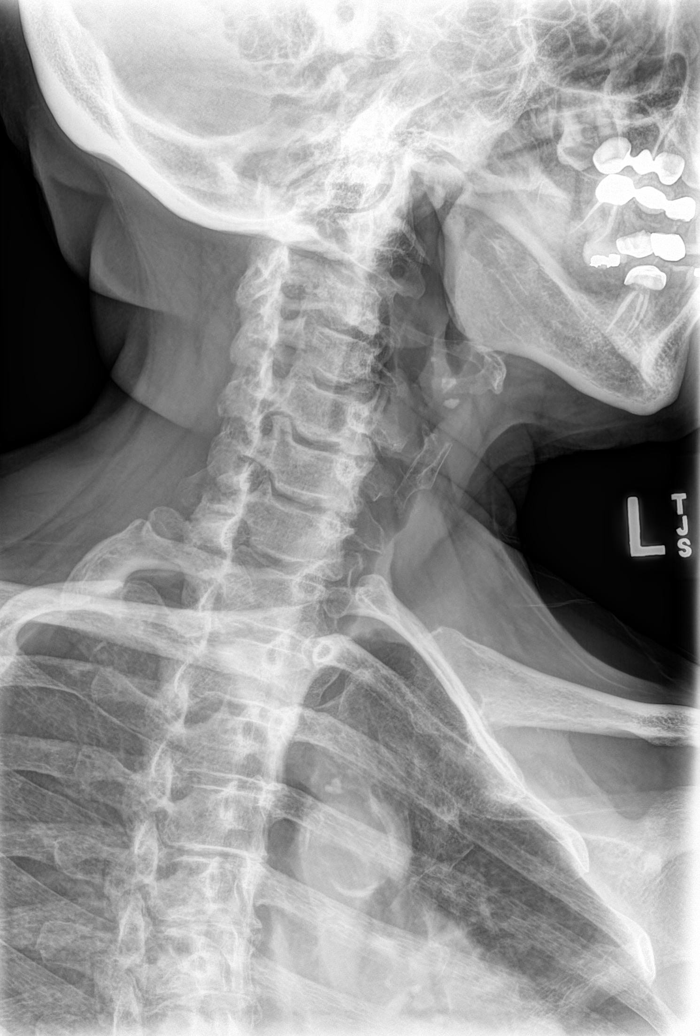
[im 3/7]
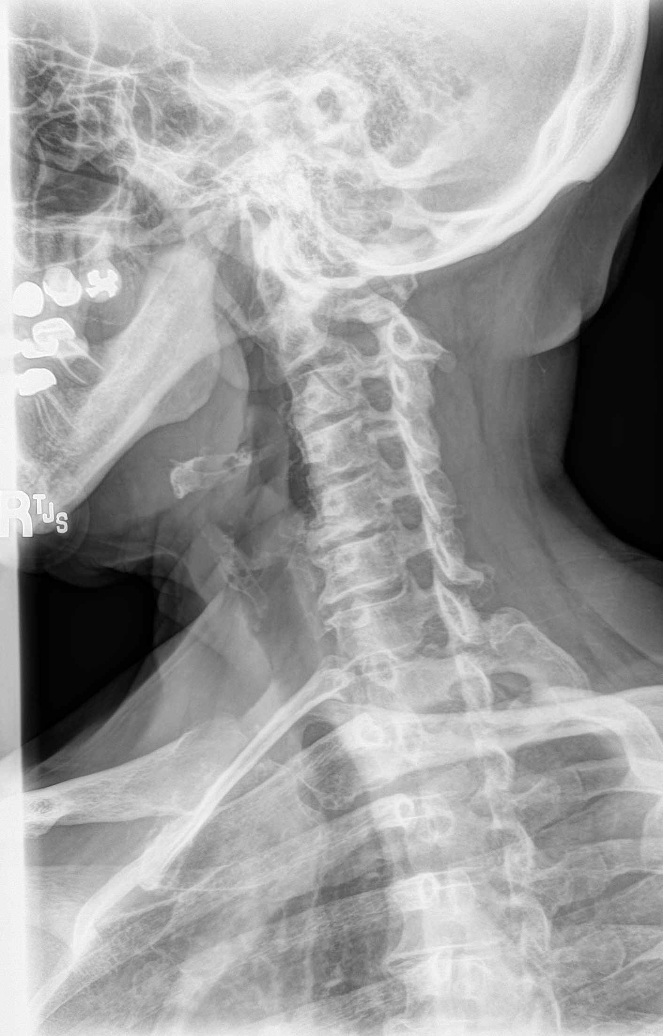
[im 4/7]
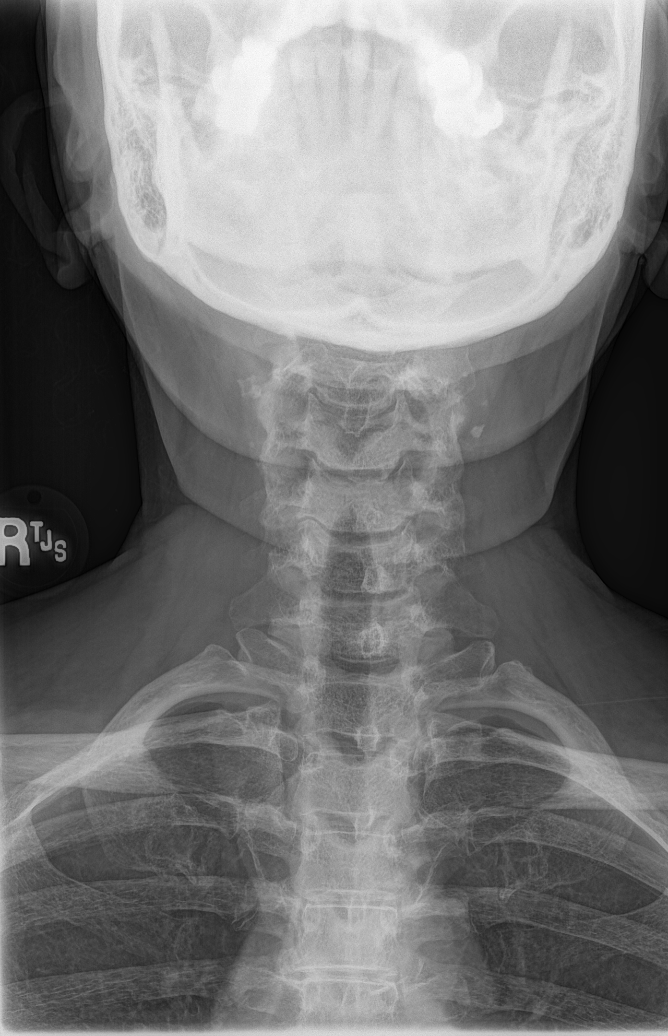
[im 5/7]
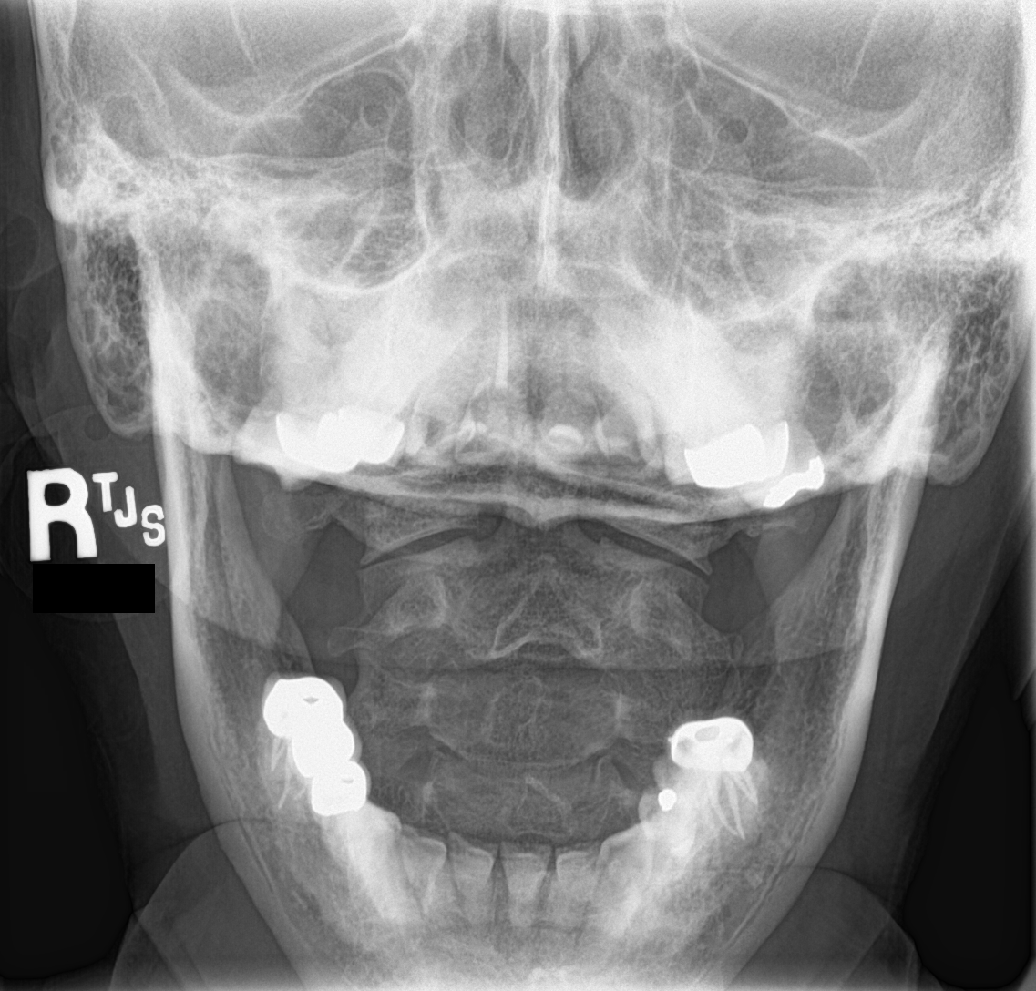
[im 6/7]
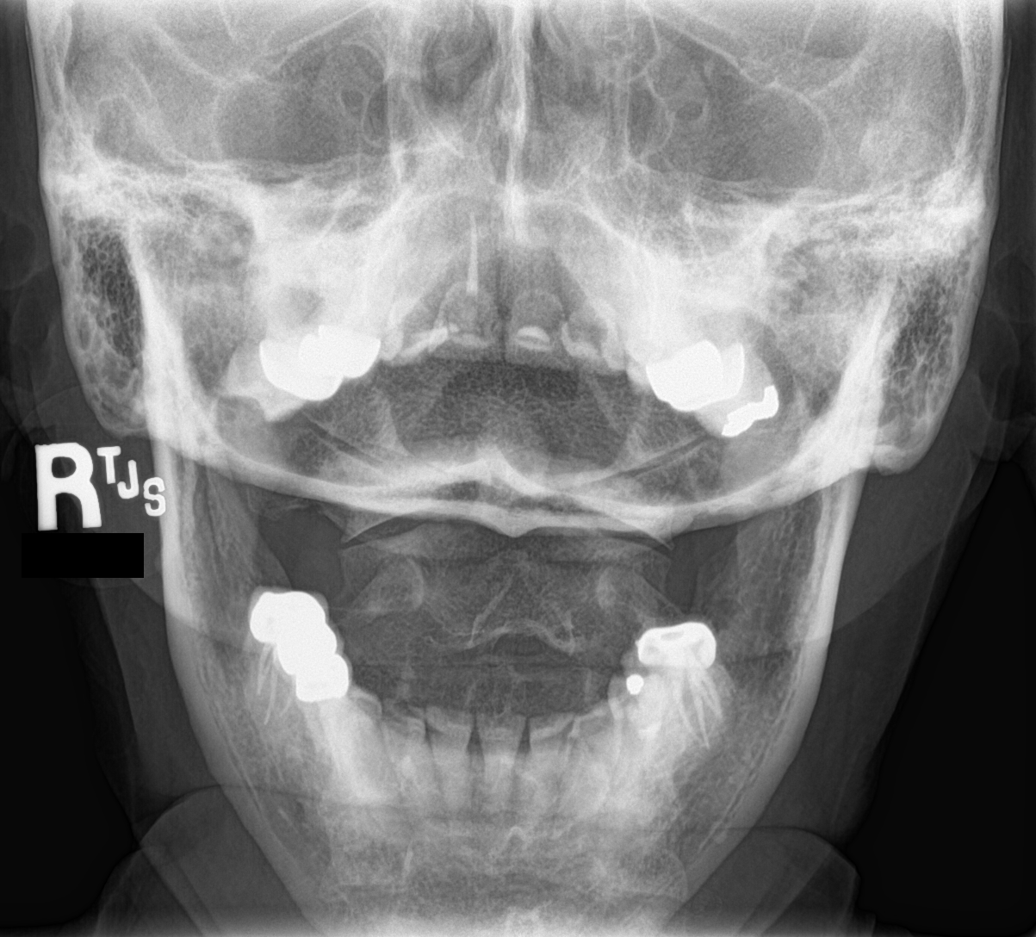
[im 7/7]
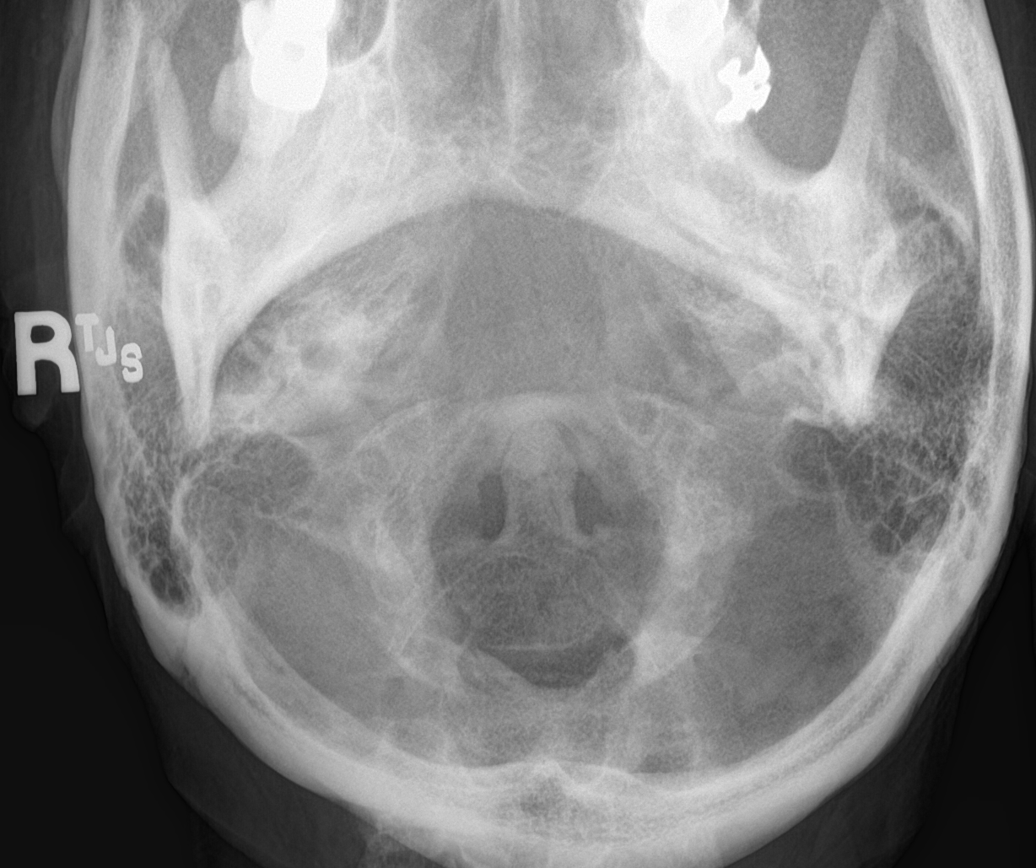

[7 of 7 positions shown; findings below may reference images not displayed]

FINDINGS: Cervical vertebral body heights and alignment are maintained. No
prevertebral soft tissue swelling. Multilevel disc space narrowing
with endplate osteophytes, greatest at C5-C6. There is facet and
uncovertebral hypertrophy.
IMPRESSION: Mild multilevel degenerative changes. Appearance is similar to the
prior study.

## 2023-01-19 ENCOUNTER — Ambulatory Visit: Payer: Medicare Other | Admitting: Physical Therapy

## 2023-01-19 ENCOUNTER — Ambulatory Visit (INDEPENDENT_AMBULATORY_CARE_PROVIDER_SITE_OTHER): Payer: Medicare Other | Admitting: Nurse Practitioner

## 2023-01-19 ENCOUNTER — Ambulatory Visit (INDEPENDENT_AMBULATORY_CARE_PROVIDER_SITE_OTHER): Payer: Medicare Other

## 2023-01-19 VITALS — BP 124/69 | HR 59 | Resp 16 | Wt 174.2 lb

## 2023-01-19 DIAGNOSIS — I10 Essential (primary) hypertension: Secondary | ICD-10-CM

## 2023-01-19 DIAGNOSIS — I890 Lymphedema, not elsewhere classified: Secondary | ICD-10-CM

## 2023-01-19 DIAGNOSIS — M7989 Other specified soft tissue disorders: Secondary | ICD-10-CM | POA: Diagnosis not present

## 2023-01-19 DIAGNOSIS — L97412 Non-pressure chronic ulcer of right heel and midfoot with fat layer exposed: Secondary | ICD-10-CM | POA: Diagnosis not present

## 2023-01-19 NOTE — Progress Notes (Signed)
 Subjective:    Patient ID: Jennifer [name], female    DOB: [birth year], 73 y.o.   MRN: [id number] Chief Complaint  Patient presents with   Follow-up    3 month bilateral reflux follow up    Cindie Rajagopalan is a 73 year old female who presents today for evaluation due to a right heel wound which has been slow to heal.  It initially began as a blister.  She has been having it treated by podiatry and has an upcoming evaluation with wound care.  The wound itself is superficial and based on photos had good granulation tissue.  The patient has a history of lymphedema and has been fairly well-controlled but she has not been able to wear compression on her right leg due to the ulcer itself.  She has been able to continue with use of her lymphedema pump.  Today her noninvasive study showed no evidence of DVT or superficial phlebitis in the right lower extremity.  No deep venous insufficiency or superficial venous reflux.  She previously had ABIs done in March and it showed normal perfusion.  She also has strongly palpable pulses today.    Review of Systems  Cardiovascular:  Positive for leg swelling.  Skin:  Positive for wound.  All other systems reviewed and are negative.      Objective:   Physical Exam Vitals reviewed.  HENT:     Head: Normocephalic.  Cardiovascular:     Rate and Rhythm: Normal rate.     Pulses:          Dorsalis pedis pulses are 2+ on the right side.       Posterior tibial pulses are 2+ on the right side.  Pulmonary:     Effort: Pulmonary effort is normal.  Musculoskeletal:     Right lower leg: Edema present.     Left lower leg: Edema present.  Skin:    General: Skin is warm and dry.  Neurological:     Mental Status: She is alert and oriented to person, place, and time.  Psychiatric:        Mood and Affect: Mood normal.        Behavior: Behavior normal.        Thought Content: Thought content normal.        Judgment: Judgment normal.     BP 124/69    Pulse (!) 59   Resp 16   Wt 174 lb 3.2 oz (79 kg)   BMI 32.91 kg/m   Past Medical History:  Diagnosis Date   Anemia    Anxiety    Bariatric surgery status    Complication of anesthesia    Diificulty breathing for about 15 minutes after bariatric surgery   Constipation    COVID-19    Dysrhythmia    Elevated liver enzymes    GERD (gastroesophageal reflux disease)    Hemorrhoids    Herpes genitalis    High cholesterol    Hyperlipidemia    Hypertension    Hypothyroidism    IDA (iron  deficiency anemia) 02/09/2021   Neuropathy    Osteoarthritis    Sleep apnea    CPAP    Social History   Socioeconomic History   Marital status: Married    Spouse name: Not on file   Number of children: Not on file   Years of education: Not on file   Highest education level: 12th grade  Occupational History   Not on file  Tobacco Use   Smoking status:  Former    Current packs/day: 0.00    Average packs/day: 1.5 packs/day for 24.0 years (36.0 ttl pk-yrs)    Types: Cigarettes    Start date: 32    Quit date: 1993    Years since quitting: 32.0   Smokeless tobacco: Never   Tobacco comments:    quit 1995.   Vaping Use   Vaping status: Never Used  Substance and Sexual Activity   Alcohol  use: Not Currently    Alcohol /week: 14.0 standard drinks of alcohol     Types: 14 Glasses of wine per week   Drug use: No   Sexual activity: Not Currently    Birth control/protection: Surgical    Comment: Hysterectomy  Other Topics Concern   Not on file  Social History Narrative   Lives in [place].    Married.    Retired 2015, engineer, structural.    One son; granddaughter.    Left handed    Caffeine- decaf coffee.    Social Drivers of Health   Financial Resource Strain: Patient Declined (01/19/2023)   Overall Financial Resource Strain (CARDIA)    Difficulty of Paying Living Expenses: Patient declined  Food Insecurity: Unknown (01/19/2023)   Hunger Vital Sign    Worried About Running Out  of Food in the Last Year: Patient declined    Ran Out of Food in the Last Year: Never true  Transportation Needs: No Transportation Needs (01/19/2023)   PRAPARE - Administrator, Civil Service (Medical): No    Lack of Transportation (Non-Medical): No  Physical Activity: Unknown (01/19/2023)   Exercise Vital Sign    Days of Exercise per Week: 0 days    Minutes of Exercise per Session: Not on file  Stress: No Stress Concern Present (01/19/2023)   [employer] of Occupational Health - Occupational Stress Questionnaire    Feeling of Stress : Only a little  Social Connections: Unknown (01/19/2023)   Social Connection and Isolation Panel [NHANES]    Frequency of Communication with Friends and Family: Once a week    Frequency of Social Gatherings with Friends and Family: Patient declined    Attends Religious Services: Patient declined    Database Administrator or Organizations: No    Attends Engineer, Structural: Not on file    Marital Status: Married  Catering Manager Violence: Not At Risk (05/04/2022)   Humiliation, Afraid, Rape, and Kick questionnaire    Fear of Current or Ex-Partner: No    Emotionally Abused: No    Physically Abused: No    Sexually Abused: No    Past Surgical History:  Procedure Laterality Date   ABDOMINAL HYSTERECTOMY     total for fibroids no h/o abnormal pap   bariatric sleeve  2015   BREAST EXCISIONAL BIOPSY Left 1998   carpal tunnel repair     CATARACT EXTRACTION W/PHACO Right 11/01/2021   Procedure: CATARACT EXTRACTION PHACO AND INTRAOCULAR LENS PLACEMENT (IOC) RIGHT;  Surgeon: Myrna Adine Anes, MD;  Location: [facility];  Service: Ophthalmology;  Laterality: Right;  sleep apnea 4.95 00:49.7   CATARACT EXTRACTION W/PHACO Left 11/15/2021   Procedure: CATARACT EXTRACTION PHACO AND INTRAOCULAR LENS PLACEMENT (IOC) LEFT 4.94 00:32.3;  Surgeon: Myrna Adine Anes, MD;  Location: [facility];  Service: Ophthalmology;   Laterality: Left;  sleep apnea   COLONOSCOPY WITH PROPOFOL  N/A 02/11/2016   Procedure: COLONOSCOPY WITH PROPOFOL ;  Surgeon: Ruel Kung, MD;  Location: [facility] ENDOSCOPY;  Service: Endoscopy;  Laterality: N/A;  COLONOSCOPY WITH PROPOFOL  N/A 10/01/2019   Procedure: COLONOSCOPY WITH PROPOFOL ;  Surgeon: Therisa Bi, MD;  Location: [facility] ENDOSCOPY;  Service: Gastroenterology;  Laterality: N/A;   COLONOSCOPY WITH PROPOFOL  N/A 11/19/2020   Procedure: COLONOSCOPY WITH PROPOFOL ;  Surgeon: Therisa Bi, MD;  Location: [facility] ENDOSCOPY;  Service: Gastroenterology;  Laterality: N/A;   COLONOSCOPY WITH PROPOFOL  N/A 05/05/2022   Procedure: COLONOSCOPY WITH PROPOFOL ;  Surgeon: Maryruth Ole DASEN, MD;  Location: [facility] ENDOSCOPY;  Service: Endoscopy;  Laterality: N/A;   ESOPHAGOGASTRODUODENOSCOPY (EGD) WITH PROPOFOL  N/A 12/02/2019   Procedure: ESOPHAGOGASTRODUODENOSCOPY (EGD) WITH PROPOFOL ;  Surgeon: Therisa Bi, MD;  Location[facility];  Service: Gastroenterology;  Laterality: N/A;   ESOPHAGOGASTRODUODENOSCOPY (EGD) WITH PROPOFOL  N/A 11/19/2020   Procedure: ESOPHAGOGASTRODUODENOSCOPY (EGD) WITH PROPOFOL ;  Surgeon: Therisa Bi, MD;  Location: [facility] ENDOSCOPY;  Service: Gastroenterology;  Laterality: N/A;   ESOPHAGOGASTRODUODENOSCOPY (EGD) WITH PROPOFOL  N/A 05/05/2022   Procedure: ESOPHAGOGASTRODUODENOSCOPY (EGD) WITH PROPOFOL ;  Surgeon: Maryruth Ole DASEN, MD;  Location: [facility] ENDOSCOPY;  Service: Endoscopy;  Laterality: N/A;   FLEXIBLE SIGMOIDOSCOPY N/A 02/06/2021   Procedure: FLEXIBLE SIGMOIDOSCOPY;  Surgeon: Onita Elspeth Sharper, DO;  Location: [facility] ENDOSCOPY;  Service: Gastroenterology;  Laterality: N/A;   GIVENS CAPSULE STUDY N/A 06/07/2021   Procedure: GIVENS CAPSULE STUDY;  Surgeon: Unk Corinn Skiff, MD;  Location: [facility] ENDOSCOPY;  Service: Gastroenterology;  Laterality: N/A;   GIVENS CAPSULE STUDY N/A 06/09/2021   Procedure: GIVENS CAPSULE STUDY;  Surgeon: Unk Corinn Skiff, MD;  Location: [facility]  ENDOSCOPY;  Service: Gastroenterology;  Laterality: N/A;   HEMORRHOID SURGERY     LAPAROSCOPIC GASTRIC RESTRICTIVE DUODENAL PROCEDURE (DUODENAL SWITCH) Bilateral    2020    Family History  Problem Relation Age of Onset   Hypertension Mother    Heart disease Father    Alcohol  abuse Father    Breast cancer Sister 45       materal 1/2 sister   Hypertension Sister    Hypertension Brother     Allergies  Allergen Reactions   Bee Pollen Itching   Celecoxib Hives   Hydrocodone  Nausea Only   Pollen Extract Itching   Sodium Ferric Gluconate [Ferrous Gluconate]     IV ferric gluconate - shortly after the infusion developed back pain shooting into left arm and bilateral hand swelling   Nasacort  [Triamcinolone ] Other (See Comments)    Nasal - Nose Bleeds   Vicodin [Hydrocodone -Acetaminophen ] Nausea Only       Latest Ref Rng & Units 12/19/2022   11:23 AM 10/27/2022    8:25 AM 09/07/2022    9:14 AM  CBC  WBC 4.0 - 10.5 K/uL 5.5  5.3  4.7   Hemoglobin 12.0 - 15.0 g/dL 88.4  88.7  89.4   Hematocrit 36.0 - 46.0 % 36.4  35.6  32.6   Platelets 150 - 400 K/uL 161  140  170       CMP     Component Value Date/Time   NA 138 12/19/2022 1123   NA 138 10/14/2020 1038   NA 139 06/26/2013 0409   K 4.2 12/19/2022 1123   K 4.3 06/26/2013 0409   CL 98 12/19/2022 1123   CL 106 06/26/2013 0409   CO2 30 12/19/2022 1123   CO2 28 06/26/2013 0409   GLUCOSE 103 (H) 12/19/2022 1123   GLUCOSE 108 (H) 06/26/2013 0409   BUN 20 12/19/2022 1123   BUN 11 10/14/2020 1038   BUN 12 06/26/2013 0409   CREATININE 0.93 12/19/2022 1123   CREATININE 0.72 06/26/2013 0409  CALCIUM  9.0 12/19/2022 1123   CALCIUM  8.5 06/26/2013 0409   PROT 7.2 12/19/2022 1123   PROT 7.2 10/14/2020 1038   ALBUMIN 4.3 12/19/2022 1123   ALBUMIN 4.9 (H) 10/14/2020 1038   ALBUMIN 3.6 06/26/2013 0409   AST 60 (H) 12/19/2022 1123   ALT 40 12/19/2022 1123   ALKPHOS 105 12/19/2022 1123   BILITOT 0.5 12/19/2022 1123   GFR 60.29  11/25/2022 0945   EGFR 72 10/14/2020 1038   GFRNONAA >60 12/19/2022 1123   GFRNONAA >60 06/26/2013 0409     No results found.     Assessment & Plan:   1. Ulcer of heel, right, with fat layer exposed (HCC) (Primary) Today noninvasive studies indicate no present venous insufficiency or venous reflux.  In addition she has strongly palpable pulses and her previous ABIs from several months ago showed normal arterial perfusion.  Based on this, I do not feel that there is any role for vascular intervention at this time.  She will continue to follow with podiatry and has upcoming evaluation from wound care as well.  2. Lymphedema The patient's lymphedema has been fairly well-controlled.  She will continue with use of lymphedema pump as controlling her lymphedema will also improve her wound healing prospects.  Will plan on having the patient return for reevaluation in 6 months or sooner if she begins to have issues with her edema.  3. Essential hypertension Continue antihypertensive medications as already ordered, these medications have been reviewed and there are no changes at this time.   Current Outpatient Medications on File Prior to Visit  Medication Sig Dispense Refill   acetaminophen  (TYLENOL ) 325 MG tablet Take 2 tablets (650 mg total) by mouth every 6 (six) hours as needed for mild pain (or Fever >/= 101).     acyclovir  (ZOVIRAX ) 400 MG tablet Take 1 tablet (400 mg total) by mouth 3 (three) times daily. After completing this course, you may return to taking 1 tablet daily. 30 tablet 0   famotidine  (PEPCID ) 20 MG tablet Take 1 tablet (20 mg total) by mouth at bedtime as needed for heartburn or indigestion. 90 tablet 1   FLUoxetine (PROZAC) 20 MG capsule Take 20 mg by mouth daily.     fluticasone  (FLONASE ) 50 MCG/ACT nasal spray INSTILL 1 SPRAY INTO BOTH NOSTRILS DAILY 48 mL 0   folic acid  (FOLVITE ) 1 MG tablet Take 1 tablet (1 mg total) by mouth daily. 360 tablet 0   gabapentin   (NEURONTIN ) 100 MG capsule Take 100mg  with morning 900mg , take 100mg  at lunch and and 100mg  evening dose gabapentin  900mg . 180 capsule 3   hydrocortisone  (ANUSOL -HC) 25 MG suppository Unwrap and insert 1 suppository rectally twice a day 12 suppository 1   levothyroxine  (SYNTHROID ) 50 MCG tablet Take 1 tablet (50 mcg total) by mouth daily. 90 tablet 3   losartan  (COZAAR ) 100 MG tablet TAKE 1 TABLET BY MOUTH EVERYDAY AT BEDTIME (Patient taking differently: Take 100 mg by mouth daily. TAKE 1 TABLET BY MOUTH EVERYDAY AT BEDTIME) 90 tablet 3   Multiple Vitamin (MULTIVITAMIN WITH MINERALS) TABS tablet Take 1 tablet by mouth daily.     naltrexone (DEPADE) 50 MG tablet Take by mouth daily. Patient takes  one  50 mg tablet daily at the same time     omeprazole  (PRILOSEC) 40 MG capsule Take 40 mg by mouth daily.     potassium chloride  SA (KLOR-CON  M) 20 MEQ tablet Take 1 tablet (20 mEq total) by mouth daily.  rosuvastatin  (CRESTOR ) 5 MG tablet Take 2.5 mg by mouth daily.     sodium chloride  (OCEAN) 0.65 % SOLN nasal spray Place 2 sprays into both nostrils as needed for congestion. 30 mL 2   torsemide  (DEMADEX ) 20 MG tablet Take 1 tablet (20 mg total) by mouth daily.     traZODone  (DESYREL ) 50 MG tablet TAKE 1 TABLET BY MOUTH EVERY DAY FOR SLEEP 90 tablet 3   triamterene -hydrochlorothiazide  (DYAZIDE ) 37.5-25 MG capsule TAKE 1 EACH (1 CAPSULE TOTAL) BY MOUTH DAILY. 90 capsule 3   triamterene -hydrochlorothiazide  (MAXZIDE -25) 37.5-25 MG tablet Take 1 tablet by mouth daily.     gabapentin  (NEURONTIN ) 300 MG capsule Take 3 capsules (900 mg total) by mouth 2 (two) times daily. 180 capsule 3   No current facility-administered medications on file prior to visit.    There are no Patient Instructions on file for this visit. No follow-ups on file.   Janye Maynor E Kelsey Durflinger, NP

## 2023-01-20 ENCOUNTER — Encounter (INDEPENDENT_AMBULATORY_CARE_PROVIDER_SITE_OTHER): Payer: Self-pay | Admitting: Nurse Practitioner

## 2023-01-23 ENCOUNTER — Ambulatory Visit: Payer: Medicare Other | Admitting: Family

## 2023-01-24 ENCOUNTER — Ambulatory Visit: Payer: Medicare Other | Admitting: Physical Therapy

## 2023-01-25 ENCOUNTER — Ambulatory Visit (INDEPENDENT_AMBULATORY_CARE_PROVIDER_SITE_OTHER): Payer: Medicare Other | Admitting: Podiatry

## 2023-01-25 ENCOUNTER — Encounter: Payer: Self-pay | Admitting: Podiatry

## 2023-01-25 DIAGNOSIS — R600 Localized edema: Secondary | ICD-10-CM | POA: Diagnosis not present

## 2023-01-25 DIAGNOSIS — S90821A Blister (nonthermal), right foot, initial encounter: Secondary | ICD-10-CM

## 2023-01-25 NOTE — Progress Notes (Signed)
  Subjective:  Patient ID: [name], female    DOB: [birth year],  MRN: [id number]  Chief Complaint  Patient presents with   Wound Check    It's not doing good.  The blister came back.  It's still sensitive to walk on it.  I have an appointment with Wound Care on February 3.  I was going to ask him how he feels about it.    73 y.o. female presents with the above complaint. History confirmed with patient.     Objective:  Physical Exam: warm, good capillary refill,  strong palpable DP and nonpalpable PT pulses, and bulla has resolved she does have peripheral edema        Assessment:   1. Peripheral edema   2. Blister of right heel, initial encounter      Plan:  Patient was evaluated and treated and all questions answered.  She had follow-up with vascular surgery who recommended no intervention.  Edema management has been a problem and I suspect likely this is contributing to her persistent wound as well.  I recommended application of a multilayer compression dressing.  Following application of a fiber gel silver  dressing to the wound bed Unna's boot was applied followed by cast padding, Coflex and an Ace wrap under light compression.  She will turn in 1 week to have this exchanged and she will follow-up as scheduled with the wound care center.   Return in about 1 week (around 02/01/2023) for unna boot change.

## 2023-01-25 NOTE — Progress Notes (Signed)
    GYNECOLOGY PROGRESS NOTE  Subjective:    Patient ID: Jennifer [name], female    DOB: [birth year], 73 y.o.   MRN: [id number]  HPI  Patient is a 73 y.o. G53P1031 female who presents for follow up evaluation of vulvar ulceration due to HSV. Patient has long-standing history of HSV, has been on suppression meds for ~ 10 years. Notes that she missed a few doses as she forgot to take her medication which she believes was the cause of the outbreak.  She was evaluated by Dr. Harland Birkenhead in November and informed to increase her medication dosing for a week or 2.  Since that time, patient notes that her symptoms are improved.    The following portions of the patient's history were reviewed and updated as appropriate: allergies, current medications, past family history, past medical history, past social history, past surgical history, and problem list.  Review of Systems Pertinent items are noted in HPI.   Objective:  Blood pressure (!) 109/46, pulse (!) 56, height 5' 1 (1.549 m), weight 177 lb 8 oz (80.5 kg).  Body mass index is 33.54 kg/m. General appearance: alert, cooperative, and no distress Abdomen: soft, non-tender; bowel sounds normal; no masses,  no organomegaly Pelvic: External genitalia normal without lesions.     Assessment:   1. Herpes simplex vulvovaginitis      Plan:   - Symptoms have improved, lesion has resolved. Reiterated importance of compliance with medications. Patient notes understanding. Follow up as needed.      Archie Savers, MD [facility]

## 2023-01-26 ENCOUNTER — Ambulatory Visit: Payer: Medicare Other | Admitting: Physical Therapy

## 2023-01-26 ENCOUNTER — Encounter: Payer: Self-pay | Admitting: Obstetrics and Gynecology

## 2023-01-26 ENCOUNTER — Ambulatory Visit (INDEPENDENT_AMBULATORY_CARE_PROVIDER_SITE_OTHER): Payer: Medicare Other | Admitting: Obstetrics and Gynecology

## 2023-01-26 ENCOUNTER — Other Ambulatory Visit: Payer: Self-pay | Admitting: Family

## 2023-01-26 VITALS — BP 109/46 | HR 56 | Ht 61.0 in | Wt 177.5 lb

## 2023-01-26 DIAGNOSIS — A6004 Herpesviral vulvovaginitis: Secondary | ICD-10-CM

## 2023-01-30 ENCOUNTER — Ambulatory Visit: Payer: Medicare Other | Admitting: Physical Therapy

## 2023-01-31 ENCOUNTER — Telehealth: Payer: Self-pay | Admitting: Family

## 2023-01-31 ENCOUNTER — Encounter: Payer: Self-pay | Admitting: Family

## 2023-01-31 ENCOUNTER — Ambulatory Visit (INDEPENDENT_AMBULATORY_CARE_PROVIDER_SITE_OTHER): Payer: Medicare Other | Admitting: Family

## 2023-01-31 VITALS — BP 116/80 | HR 67 | Temp 97.9°F | Ht 61.0 in | Wt 173.0 lb

## 2023-01-31 DIAGNOSIS — F1090 Alcohol use, unspecified, uncomplicated: Secondary | ICD-10-CM

## 2023-01-31 DIAGNOSIS — G629 Polyneuropathy, unspecified: Secondary | ICD-10-CM

## 2023-01-31 NOTE — Progress Notes (Signed)
 Assessment & Plan:  Alcohol  use disorder Assessment & Plan: She endorses drinking alcohol  again.  Reassured patient that we will support her and that addiction is a chronic disease.  She has stopped taking naltrexone.  Advised patient [facility].  She politely declines attending AA meetings and has been quite satisfied with psychiatry and counseling through [facility].   Follow-up appointment [facility] 02/06/23.     Neuropathy Assessment & Plan: Chronic, stable. Continue Gabapentin  1000mg  qam, 100mg  at lunch , 1000mg  qpm.,  Tylenol  arthritis 2 tablets prn.      Return precautions given.   Risks, benefits, and alternatives of the medications and treatment plan prescribed today were discussed, and patient expressed understanding.   Education regarding symptom management and diagnosis given to patient on AVS either electronically or printed.  Return in about 2 months (around 03/31/2023).  [name], FNP  Subjective:    Patient ID: Jennifer [name], female    DOB: 1950/08/30, 73 y.o.   MRN: [id number]  CC: Jennifer [name] is a 73 y.o. female who presents today for follow up.   HPI: She had achieved sobriety 6  months ago.  She endorses that over the holidays due to familial stress she resumed drinking alcohol .    She is drinking 3 glasses of wine per day.   She has stopped the naltrexone.  She has follow up with [facility].   She has previously been on ativan .  She is following with counselor.   She remains compliant with Prozac 20 mg daily and is overall found this medication helpful for her  Peripheral neuropathy is overall well-controlled with Gabapentin  1000mg  qam, 100mg  at lunch , 1000mg  qpm. She also takes Tylenol  arthritis 2 tablets in the morning, 1 tablet at noon.  She is satisfied with control of symptom.       Allergies: Bee pollen, Celecoxib, Hydrocodone , Pollen extract, Sodium ferric  gluconate [ferrous gluconate], Nasacort  Demi.david ], and Vicodin [hydrocodone -acetaminophen ] Current Outpatient Medications on File Prior to Visit  Medication Sig Dispense Refill   acetaminophen  (TYLENOL ) 325 MG tablet Take 2 tablets (650 mg total) by mouth every 6 (six) hours as needed for mild pain (or Fever >/= 101).     acyclovir  (ZOVIRAX ) 400 MG tablet Take 1 tablet (400 mg total) by mouth 2 (two) times daily. 180 tablet 3   famotidine  (PEPCID ) 20 MG tablet Take 1 tablet (20 mg total) by mouth at bedtime as needed for heartburn or indigestion. 90 tablet 1   FLUoxetine (PROZAC) 20 MG capsule Take 20 mg by mouth daily.     fluticasone  (FLONASE ) 50 MCG/ACT nasal spray INSTILL 1 SPRAY INTO BOTH NOSTRILS DAILY 48 mL 0   folic acid  (FOLVITE ) 1 MG tablet Take 1 tablet (1 mg total) by mouth daily. 360 tablet 0   gabapentin  (NEURONTIN ) 100 MG capsule Take 100mg  with morning 900mg , take 100mg  at lunch and and 100mg  evening dose gabapentin  900mg . 180 capsule 3   hydrocortisone  (ANUSOL -HC) 25 MG suppository Unwrap and insert 1 suppository rectally twice a day 12 suppository 1   levothyroxine  (SYNTHROID ) 50 MCG tablet Take 1 tablet (50 mcg total) by mouth daily. 90 tablet 3   losartan  (COZAAR ) 100 MG tablet TAKE 1 TABLET BY MOUTH EVERYDAY AT BEDTIME (Patient taking differently: Take 100 mg by mouth daily. TAKE 1 TABLET BY MOUTH EVERYDAY AT BEDTIME) 90 tablet 3   Multiple Vitamin (MULTIVITAMIN WITH MINERALS) TABS tablet Take 1 tablet  by mouth daily.     naltrexone (DEPADE) 50 MG tablet Take by mouth daily. Patient takes  one  50 mg tablet daily at the same time     omeprazole  (PRILOSEC) 40 MG capsule Take 40 mg by mouth daily.     potassium chloride  SA (KLOR-CON  M) 20 MEQ tablet Take 1 tablet (20 mEq total) by mouth daily.     rosuvastatin  (CRESTOR ) 5 MG tablet Take 2.5 mg by mouth daily.     sodium chloride  (OCEAN) 0.65 % SOLN nasal spray Place 2 sprays into both nostrils as needed for congestion. 30  mL 2   torsemide  (DEMADEX ) 20 MG tablet Take 1 tablet (20 mg total) by mouth daily.     traZODone  (DESYREL ) 50 MG tablet TAKE 1 TABLET BY MOUTH EVERY DAY FOR SLEEP 90 tablet 3   triamterene -hydrochlorothiazide  (DYAZIDE ) 37.5-25 MG capsule TAKE 1 EACH (1 CAPSULE TOTAL) BY MOUTH DAILY. 90 capsule 3   gabapentin  (NEURONTIN ) 300 MG capsule Take 3 capsules (900 mg total) by mouth 2 (two) times daily. 180 capsule 3   No current facility-administered medications on file prior to visit.    Review of Systems  Constitutional:  Negative for chills and fever.  Respiratory:  Negative for cough.   Cardiovascular:  Negative for chest pain and palpitations.  Gastrointestinal:  Negative for nausea and vomiting.      Objective:    BP 116/80   Pulse 67   Temp 97.9 F (36.6 C) (Oral)   Ht 5' 1 (1.549 m)   Wt 173 lb (78.5 kg)   SpO2 97%   BMI 32.69 kg/m  BP Readings from Last 3 Encounters:  01/31/23 116/80  01/26/23 (!) 109/46  01/19/23 124/69   Wt Readings from Last 3 Encounters:  02/01/23 173 lb (78.5 kg)  01/31/23 173 lb (78.5 kg)  01/26/23 177 lb 8 oz (80.5 kg)      01/31/2023   11:12 AM 12/27/2022    9:26 AM 11/25/2022    9:10 AM  Depression screen PHQ 2/9  Decreased Interest 0 0 0  Down, Depressed, Hopeless 0 0 0  PHQ - 2 Score 0 0 0    Physical Exam Vitals reviewed.  Constitutional:      Appearance: She is well-developed.  Eyes:     Conjunctiva/sclera: Conjunctivae normal.  Cardiovascular:     Rate and Rhythm: Normal rate and regular rhythm.     Pulses: Normal pulses.     Heart sounds: Normal heart sounds.  Pulmonary:     Effort: Pulmonary effort is normal.     Breath sounds: Normal breath sounds. No wheezing, rhonchi or rales.  Skin:    General: Skin is warm and dry.  Neurological:     Mental Status: She is alert.  Psychiatric:        Speech: Speech normal.        Behavior: Behavior normal.        Thought Content: Thought content normal.

## 2023-01-31 NOTE — Patient Instructions (Signed)
 Very nice to see you today.  Please ensure that you do reach out to [facility] and let me know what they advise.

## 2023-01-31 NOTE — Telephone Encounter (Signed)
 Copied from CRM [id number]. Topic: General - Other >> Jan 31, 2023  2:20 PM Deaijah H wrote: Reason for CRM: Patient called in stating Dr. Dineen advise to send an answer through email through [facility], but couldn't. Would like to leave message : She rescheduled appointment [facility] for Jan 20th

## 2023-02-01 ENCOUNTER — Ambulatory Visit (INDEPENDENT_AMBULATORY_CARE_PROVIDER_SITE_OTHER): Payer: Medicare Other | Admitting: Podiatry

## 2023-02-01 ENCOUNTER — Encounter: Payer: Self-pay | Admitting: Podiatry

## 2023-02-01 ENCOUNTER — Ambulatory Visit: Payer: Medicare Other | Admitting: Physical Therapy

## 2023-02-01 ENCOUNTER — Telehealth: Payer: Self-pay | Admitting: Family

## 2023-02-01 VITALS — Ht 61.0 in | Wt 173.0 lb

## 2023-02-01 DIAGNOSIS — R600 Localized edema: Secondary | ICD-10-CM | POA: Diagnosis not present

## 2023-02-01 NOTE — Telephone Encounter (Signed)
 Copied from CRM [id number]. Topic: Medicare AWV >> Feb 01, 2023 10:33 AM Juliana Ocean wrote: Reason for CRM: Called LVM 02/01/2023 to schedule AWV. Please schedule office or virtual visits.  Jennifer [name]; Care Guide Ambulatory Clinical Support [place] [facility] Direct Dial: [phone]

## 2023-02-01 NOTE — Assessment & Plan Note (Addendum)
 She endorses drinking alcohol  again.  Reassured patient that we will support her and that addiction is a chronic disease.  She has stopped taking naltrexone.  Advised patient to [facility].  She politely declines attending AA meetings and has been quite satisfied with psychiatry and counseling through [facility].   Follow-up appointment with [facility] 02/06/23.

## 2023-02-01 NOTE — Telephone Encounter (Signed)
 noted

## 2023-02-01 NOTE — Assessment & Plan Note (Signed)
 Chronic, stable. Continue Gabapentin  1000mg  qam, 100mg  at lunch , 1000mg  qpm.,  Tylenol  arthritis 2 tablets prn.

## 2023-02-05 NOTE — Progress Notes (Signed)
  Subjective:  Patient ID: Jennifer [name], female    DOB: Apr 28, 1950,  MRN: [id number]  Chief Complaint  Patient presents with   Wound Check    Pt is here for unna boot change on the right foot, pt states her foot is ok but the area where the ulcer is is very tender, and other than that she has no complaints.    73 y.o. female presents with the above complaint. History confirmed with patient.     Objective:  Physical Exam: warm, good capillary refill,  strong palpable DP and nonpalpable PT pulses, and full-thickness ulcer still present, no signs of infection she does have peripheral edema    Assessment:   1. Peripheral edema      Plan:  Patient was evaluated and treated and all questions answered.  Improved with 1 application of Unna boot.  I recommended application of a multilayer compression dressing.  Following application of a fiber gel silver dressing to the wound bed Unna's boot was applied followed by cast padding, Coflex and an Ace wrap under light compression.  She will turn in 1 week to have this exchanged and she will follow-up as scheduled with the wound care center.   Return in about 1 week (around 02/08/2023) for unna boot change.

## 2023-02-06 ENCOUNTER — Ambulatory Visit: Payer: Medicare Other | Admitting: Physical Therapy

## 2023-02-08 ENCOUNTER — Ambulatory Visit: Payer: Medicare Other | Admitting: Podiatry

## 2023-02-08 ENCOUNTER — Ambulatory Visit: Payer: Medicare Other | Admitting: Physical Therapy

## 2023-02-13 ENCOUNTER — Ambulatory Visit: Payer: Medicare Other | Admitting: Physical Therapy

## 2023-02-15 ENCOUNTER — Encounter: Payer: Self-pay | Admitting: Podiatry

## 2023-02-15 ENCOUNTER — Ambulatory Visit: Payer: Medicare Other | Admitting: Physical Therapy

## 2023-02-15 ENCOUNTER — Ambulatory Visit (INDEPENDENT_AMBULATORY_CARE_PROVIDER_SITE_OTHER): Payer: Medicare Other | Admitting: Podiatry

## 2023-02-15 VITALS — Ht 61.0 in | Wt 173.0 lb

## 2023-02-15 DIAGNOSIS — R600 Localized edema: Secondary | ICD-10-CM | POA: Diagnosis not present

## 2023-02-15 DIAGNOSIS — L97412 Non-pressure chronic ulcer of right heel and midfoot with fat layer exposed: Secondary | ICD-10-CM

## 2023-02-16 NOTE — Progress Notes (Signed)
  Subjective:  Patient ID: Jennifer [name], female    DOB: 1950-05-17,  MRN: [id number]  Chief Complaint  Patient presents with   Wound Check    Pt is here to f/u about right heel, she states there is still some pain to the area.    73 y.o. female presents with the above complaint. History confirmed with patient.     Objective:  Physical Exam: warm, good capillary refill,  strong palpable DP and nonpalpable PT pulses, and full-thickness ulcer still present, no signs of infection she does have peripheral edema.  Ulceration has significant hyperkeratosis today exposed subcutaneous tissue no signs of infection measures 2.0 x 1.0 x 0.2 cm    Assessment:   1. Peripheral edema   2. Ulcer of heel, right, with fat layer exposed (HCC)      Plan:  Patient was evaluated and treated and all questions answered.  Improvement with Unna boot compression therapy.  I recommended application of a multilayer compression dressing again today also recommend debridement of the wound.  The wound was debrided sharply in an excisional manner with a scalpel to the subcutaneous layer to remove nonviable tissue surrounding hyperkeratosis.  It has a fibrogranular wound bed.  There are no signs of active infection.  Hemostasis achieved manually.  Following application of a fiber gel silver dressing to the wound bed Unna's boot was applied followed by cast padding, and an Ace wrap under light compression.  She has follow-up Monday on the wound care center.  Should be able to be removed and continue wound care there after this.   Return if symptoms worsen or fail to improve.

## 2023-02-20 ENCOUNTER — Encounter: Payer: Medicare Other | Attending: Physician Assistant | Admitting: Physician Assistant

## 2023-02-20 ENCOUNTER — Ambulatory Visit: Payer: Medicare Other | Admitting: Physical Therapy

## 2023-02-20 DIAGNOSIS — L97412 Non-pressure chronic ulcer of right heel and midfoot with fat layer exposed: Secondary | ICD-10-CM | POA: Insufficient documentation

## 2023-02-20 DIAGNOSIS — L84 Corns and callosities: Secondary | ICD-10-CM | POA: Insufficient documentation

## 2023-02-20 DIAGNOSIS — G9009 Other idiopathic peripheral autonomic neuropathy: Secondary | ICD-10-CM | POA: Insufficient documentation

## 2023-02-22 ENCOUNTER — Ambulatory Visit: Payer: Medicare Other | Admitting: Physical Therapy

## 2023-02-25 ENCOUNTER — Other Ambulatory Visit: Payer: Self-pay | Admitting: Family

## 2023-02-25 DIAGNOSIS — F32A Depression, unspecified: Secondary | ICD-10-CM

## 2023-02-27 ENCOUNTER — Ambulatory Visit: Payer: Medicare Other | Admitting: Physical Therapy

## 2023-02-27 ENCOUNTER — Encounter: Payer: Medicare Other | Admitting: Physician Assistant

## 2023-02-27 DIAGNOSIS — L84 Corns and callosities: Secondary | ICD-10-CM | POA: Diagnosis not present

## 2023-02-27 DIAGNOSIS — L97412 Non-pressure chronic ulcer of right heel and midfoot with fat layer exposed: Secondary | ICD-10-CM | POA: Diagnosis not present

## 2023-02-27 DIAGNOSIS — G9009 Other idiopathic peripheral autonomic neuropathy: Secondary | ICD-10-CM | POA: Diagnosis not present

## 2023-03-01 ENCOUNTER — Ambulatory Visit: Payer: Medicare Other | Admitting: Physical Therapy

## 2023-03-06 ENCOUNTER — Ambulatory Visit: Payer: Medicare Other | Admitting: Physical Therapy

## 2023-03-06 ENCOUNTER — Encounter: Payer: Medicare Other | Admitting: Physician Assistant

## 2023-03-06 DIAGNOSIS — L84 Corns and callosities: Secondary | ICD-10-CM | POA: Diagnosis not present

## 2023-03-06 DIAGNOSIS — L97412 Non-pressure chronic ulcer of right heel and midfoot with fat layer exposed: Secondary | ICD-10-CM | POA: Diagnosis not present

## 2023-03-06 DIAGNOSIS — G9009 Other idiopathic peripheral autonomic neuropathy: Secondary | ICD-10-CM | POA: Diagnosis not present

## 2023-03-07 ENCOUNTER — Telehealth: Payer: Self-pay | Admitting: Family

## 2023-03-07 NOTE — Telephone Encounter (Signed)
Copied from CRM [id number]. Topic: Medicare AWV >> Mar 07, 2023 11:01 AM Payton Doughty wrote: Reason for CRM: Called LVM 03/07/2023 to schedule AWV. Please schedule office or virtual visits.  Verlee Rossetti; Care Guide Ambulatory Clinical Support  l [facility] Direct Dial: [phone]

## 2023-03-08 ENCOUNTER — Ambulatory Visit: Payer: Medicare Other | Admitting: Physical Therapy

## 2023-03-13 ENCOUNTER — Encounter: Payer: Medicare Other | Admitting: Physician Assistant

## 2023-03-13 ENCOUNTER — Ambulatory Visit: Payer: Medicare Other | Admitting: Physical Therapy

## 2023-03-13 DIAGNOSIS — L97412 Non-pressure chronic ulcer of right heel and midfoot with fat layer exposed: Secondary | ICD-10-CM | POA: Diagnosis not present

## 2023-03-13 DIAGNOSIS — L84 Corns and callosities: Secondary | ICD-10-CM | POA: Diagnosis not present

## 2023-03-13 DIAGNOSIS — G9009 Other idiopathic peripheral autonomic neuropathy: Secondary | ICD-10-CM | POA: Diagnosis not present

## 2023-03-15 ENCOUNTER — Ambulatory Visit: Payer: Medicare Other | Admitting: Physical Therapy

## 2023-03-20 ENCOUNTER — Ambulatory Visit: Payer: Medicare Other | Admitting: Physical Therapy

## 2023-03-22 ENCOUNTER — Ambulatory Visit: Payer: Medicare Other | Admitting: Physical Therapy

## 2023-03-27 ENCOUNTER — Ambulatory Visit: Payer: Medicare Other | Admitting: Physical Therapy

## 2023-03-27 ENCOUNTER — Ambulatory Visit: Payer: Medicare Other | Admitting: Physician Assistant

## 2023-03-28 ENCOUNTER — Encounter: Payer: Self-pay | Admitting: Family

## 2023-03-28 ENCOUNTER — Ambulatory Visit: Payer: Medicare Other | Admitting: Family

## 2023-03-28 VITALS — BP 120/78 | HR 76 | Temp 97.9°F | Ht 61.0 in | Wt 175.3 lb

## 2023-03-28 DIAGNOSIS — M1990 Unspecified osteoarthritis, unspecified site: Secondary | ICD-10-CM

## 2023-03-28 DIAGNOSIS — G8929 Other chronic pain: Secondary | ICD-10-CM | POA: Diagnosis not present

## 2023-03-28 DIAGNOSIS — I10 Essential (primary) hypertension: Secondary | ICD-10-CM

## 2023-03-28 DIAGNOSIS — M546 Pain in thoracic spine: Secondary | ICD-10-CM | POA: Diagnosis not present

## 2023-03-28 MED ORDER — GABAPENTIN 300 MG PO CAPS: ORAL_CAPSULE | ORAL | 5 refills | Status: DC

## 2023-03-28 MED ORDER — GABAPENTIN 100 MG PO CAPS: ORAL_CAPSULE | ORAL | 3 refills | Status: DC

## 2023-03-28 NOTE — Progress Notes (Signed)
 Assessment & Plan:  Arthritis Assessment & Plan: Chronic, suboptimal control.  Chronic bilateral shoulder pain, low back pain, hand pain. She is unable to take oral NSAIDs.  Did advise she may use voltaern gel on her hands.  We agreed to increase gabapention to 1000mg  at qam, 300mg  noon, and 1000mg  at bedtime for additional pain control.  We jointly and purposefully avoided tramadol due to her history of alcohol abuse. She has follow-up with orthopedics next week; we agreed to defer updating imaging to orthopedics   Chronic right-sided thoracic back pain -     Gabapentin; Take 900mg  qam, 300mg  at noon, and 900mg  at bedtime.  Dispense: 150 capsule; Refill: 5 -     Gabapentin; Take 100mg  PO qam and 100mg  evening.  Dispense: 180 capsule; Refill: 3  Essential hypertension Assessment & Plan: Chronic, stable.   Continue losartan 100mg  qd, triamterene-hctz 37.5-25 mg qd, torsemide 20 mg qd, KCL 20 mEQ every day       Return precautions given.   Risks, benefits, and alternatives of the medications and treatment plan prescribed today were discussed, and patient expressed understanding.   Education regarding symptom management and diagnosis given to patient on AVS either electronically or printed.  No follow-ups on file.  [name], FNP  Subjective:    Patient ID: Jennifer [name], female    DOB: October 26, 1950, 73 y.o.   MRN: [id number]  CC: Jennifer [name] is a 73 y.o. female who presents today for follow up.   HPI: Complains of chronic low back ache and BL shoudler pain.   She also endorses thumb pain.   Back pain is worse after standing. Denies saddle anesthesia, numbness of upper thigh or buttocks.  Denies urinary or fecal incontinence  Chronic peripheral bilateral neuropathy in her feet  Compliant with gabapentin 1000mg  morning , 1000mg  qpm.  He is interested in increasing this medicine  She is skipping lunch time dose of gabapentin.   She is taking 2 Tylenol  arthritis tablets BID.   No NSAIDs.   She is compliant with naltrexone, prozac 40mg  as started by [facility].  She reports sobriety at this time  Appointment for BL shoulder pain and low back pain with [facility] 04/05/23.   Xr lumbar 09/2020  No fracture or dislocation of the lumbar spine. Mild multilevel disc space height loss and osteophytosis throughout. Mild to moderate multilevel facet degenerative change, worst at the lower lumbar levels. Right shoulder xray 09/2021  Mild glenohumeral and acromioclavicular osteoarthritis.   Allergies: Bee pollen, Celecoxib, Hydrocodone, Pollen extract, Sodium ferric gluconate [ferrous gluconate], Nasacort [triamcinolone], and Vicodin [hydrocodone-acetaminophen] Current Outpatient Medications on File Prior to Visit  Medication Sig Dispense Refill   acetaminophen (TYLENOL) 325 MG tablet Take 2 tablets (650 mg total) by mouth every 6 (six) hours as needed for mild pain (or Fever >/= 101).     acyclovir (ZOVIRAX) 400 MG tablet Take 1 tablet (400 mg total) by mouth 2 (two) times daily. 180 tablet 3   famotidine (PEPCID) 20 MG tablet Take 1 tablet (20 mg total) by mouth at bedtime as needed for heartburn or indigestion. 90 tablet 1   FLUoxetine (PROZAC) 20 MG capsule Take 40 mg by mouth daily.     fluticasone (FLONASE) 50 MCG/ACT nasal spray INSTILL 1 SPRAY INTO BOTH NOSTRILS DAILY 48 mL 0   folic acid (FOLVITE) 1 MG tablet Take 1 tablet (1 mg total) by mouth daily. 360 tablet 0   hydrocortisone (ANUSOL-HC) 25 MG suppository Unwrap  and insert 1 suppository rectally twice a day 12 suppository 1   levothyroxine (SYNTHROID) 50 MCG tablet Take 1 tablet (50 mcg total) by mouth daily. 90 tablet 3   losartan (COZAAR) 100 MG tablet TAKE 1 TABLET BY MOUTH EVERYDAY AT BEDTIME (Patient taking differently: Take 100 mg by mouth daily. TAKE 1 TABLET BY MOUTH EVERYDAY AT BEDTIME) 90 tablet 3   Multiple Vitamin (MULTIVITAMIN WITH MINERALS) TABS tablet Take 1  tablet by mouth daily.     naltrexone (DEPADE) 50 MG tablet Take by mouth daily. Patient takes  one  50 mg tablet daily at the same time     omeprazole (PRILOSEC) 40 MG capsule Take 40 mg by mouth daily.     potassium chloride SA (KLOR-CON M) 20 MEQ tablet Take 1 tablet (20 mEq total) by mouth daily.     rosuvastatin (CRESTOR) 5 MG tablet Take 2.5 mg by mouth daily.     sodium chloride (OCEAN) 0.65 % SOLN nasal spray Place 2 sprays into both nostrils as needed for congestion. 30 mL 2   torsemide (DEMADEX) 20 MG tablet Take 1 tablet (20 mg total) by mouth daily.     traZODone (DESYREL) 50 MG tablet TAKE 1 TABLET BY MOUTH EVERY DAY FOR SLEEP 90 tablet 4   triamterene-hydrochlorothiazide (DYAZIDE) 37.5-25 MG capsule TAKE 1 EACH (1 CAPSULE TOTAL) BY MOUTH DAILY. 90 capsule 3   No current facility-administered medications on file prior to visit.    Review of Systems  Constitutional:  Negative for chills and fever.  Respiratory:  Negative for cough.   Cardiovascular:  Negative for chest pain and palpitations.  Gastrointestinal:  Negative for nausea and vomiting.  Musculoskeletal:  Positive for arthralgias and back pain.  Neurological:  Positive for numbness.      Objective:    BP 120/78   Pulse 76   Temp 97.9 F (36.6 C) (Oral)   Ht 5\' 1"  (1.549 m)   Wt 175 lb 4.8 oz (79.5 kg)   SpO2 97%   BMI 33.12 kg/m  BP Readings from Last 3 Encounters:  03/28/23 120/78  01/31/23 116/80  01/26/23 (!) 109/46   Wt Readings from Last 3 Encounters:  03/28/23 175 lb 4.8 oz (79.5 kg)  02/15/23 173 lb (78.5 kg)  02/01/23 173 lb (78.5 kg)    Physical Exam Vitals reviewed.  Constitutional:      Appearance: She is well-developed.  Eyes:     Conjunctiva/sclera: Conjunctivae normal.  Cardiovascular:     Rate and Rhythm: Normal rate and regular rhythm.     Pulses: Normal pulses.     Heart sounds: Normal heart sounds.  Pulmonary:     Effort: Pulmonary effort is normal.     Breath sounds:  Normal breath sounds. No wheezing, rhonchi or rales.  Musculoskeletal:     Right hand: No swelling. Normal range of motion.     Left hand: No swelling. Normal range of motion.  Skin:    General: Skin is warm and dry.  Neurological:     Mental Status: She is alert.  Psychiatric:        Speech: Speech normal.        Behavior: Behavior normal.        Thought Content: Thought content normal.

## 2023-03-28 NOTE — Assessment & Plan Note (Addendum)
 Chronic, suboptimal control.  Chronic bilateral shoulder pain, low back pain, hand pain. She is unable to take oral NSAIDs.  Did advise she may use voltaern gel on her hands.  We agreed to increase gabapention to 1000mg  at qam, 300mg  noon, and 1000mg  at bedtime for additional pain control.  We jointly and purposefully avoided tramadol due to her history of alcohol abuse. She has follow-up with orthopedics next week; we agreed to defer updating imaging to orthopedics

## 2023-03-28 NOTE — Assessment & Plan Note (Signed)
 Chronic, stable.   Continue losartan 100mg  qd, triamterene-hctz 37.5-25 mg qd, torsemide 20 mg qd, KCL 20 mEQ every day

## 2023-03-28 NOTE — Patient Instructions (Addendum)
 Currently, CDC recommends only a single dose of RSV vaccine for all adults ages 32 and older and for adults ages 54-74 with increased risk of severe RSV disease. You may get this at your local pharmacy.   Trial of generic diclofenac sodium GEL which is the same thing as Voltaren ( brand name) gel on your hands ONLY.    This medication is an NSAID like Aleve, ibuprofen.    We also increased gabapentin per below   Take gabapentin 100mg  with morning 900mg , take 300mg  at lunch and 100mg  evening dose with gabapentin 900mg .   max daily dose 2300mg /day  Nice to see you!

## 2023-03-29 ENCOUNTER — Ambulatory Visit: Payer: Medicare Other | Admitting: Physical Therapy

## 2023-04-03 ENCOUNTER — Ambulatory Visit: Payer: Medicare Other | Admitting: Physical Therapy

## 2023-04-03 ENCOUNTER — Encounter: Attending: Physician Assistant | Admitting: Physician Assistant

## 2023-04-03 DIAGNOSIS — G9009 Other idiopathic peripheral autonomic neuropathy: Secondary | ICD-10-CM | POA: Diagnosis not present

## 2023-04-03 DIAGNOSIS — L97412 Non-pressure chronic ulcer of right heel and midfoot with fat layer exposed: Secondary | ICD-10-CM | POA: Insufficient documentation

## 2023-04-03 DIAGNOSIS — L84 Corns and callosities: Secondary | ICD-10-CM | POA: Diagnosis not present

## 2023-04-05 ENCOUNTER — Ambulatory Visit: Payer: Medicare Other | Admitting: Physical Therapy

## 2023-04-05 DIAGNOSIS — M79645 Pain in left finger(s): Secondary | ICD-10-CM | POA: Diagnosis not present

## 2023-04-05 DIAGNOSIS — M7582 Other shoulder lesions, left shoulder: Secondary | ICD-10-CM | POA: Diagnosis not present

## 2023-04-05 DIAGNOSIS — M65322 Trigger finger, left index finger: Secondary | ICD-10-CM | POA: Diagnosis not present

## 2023-04-05 DIAGNOSIS — G8929 Other chronic pain: Secondary | ICD-10-CM | POA: Diagnosis not present

## 2023-04-05 DIAGNOSIS — M7581 Other shoulder lesions, right shoulder: Secondary | ICD-10-CM | POA: Diagnosis not present

## 2023-04-05 DIAGNOSIS — M19011 Primary osteoarthritis, right shoulder: Secondary | ICD-10-CM | POA: Diagnosis not present

## 2023-04-05 DIAGNOSIS — M79644 Pain in right finger(s): Secondary | ICD-10-CM | POA: Diagnosis not present

## 2023-04-06 DIAGNOSIS — R001 Bradycardia, unspecified: Secondary | ICD-10-CM | POA: Diagnosis not present

## 2023-04-06 DIAGNOSIS — M5416 Radiculopathy, lumbar region: Secondary | ICD-10-CM | POA: Diagnosis not present

## 2023-04-06 DIAGNOSIS — M4807 Spinal stenosis, lumbosacral region: Secondary | ICD-10-CM | POA: Diagnosis not present

## 2023-04-10 ENCOUNTER — Ambulatory Visit: Payer: Medicare Other | Admitting: Physical Therapy

## 2023-04-10 ENCOUNTER — Encounter: Admitting: Physician Assistant

## 2023-04-10 ENCOUNTER — Ambulatory Visit: Admitting: Physician Assistant

## 2023-04-10 DIAGNOSIS — L97412 Non-pressure chronic ulcer of right heel and midfoot with fat layer exposed: Secondary | ICD-10-CM | POA: Diagnosis not present

## 2023-04-10 DIAGNOSIS — L84 Corns and callosities: Secondary | ICD-10-CM | POA: Diagnosis not present

## 2023-04-10 DIAGNOSIS — G9009 Other idiopathic peripheral autonomic neuropathy: Secondary | ICD-10-CM | POA: Diagnosis not present

## 2023-04-11 DIAGNOSIS — I10 Essential (primary) hypertension: Secondary | ICD-10-CM | POA: Diagnosis not present

## 2023-04-11 DIAGNOSIS — I872 Venous insufficiency (chronic) (peripheral): Secondary | ICD-10-CM | POA: Diagnosis not present

## 2023-04-11 DIAGNOSIS — I2510 Atherosclerotic heart disease of native coronary artery without angina pectoris: Secondary | ICD-10-CM | POA: Diagnosis not present

## 2023-04-11 DIAGNOSIS — R001 Bradycardia, unspecified: Secondary | ICD-10-CM | POA: Diagnosis not present

## 2023-04-11 DIAGNOSIS — Z87891 Personal history of nicotine dependence: Secondary | ICD-10-CM | POA: Diagnosis not present

## 2023-04-11 DIAGNOSIS — E7849 Other hyperlipidemia: Secondary | ICD-10-CM | POA: Diagnosis not present

## 2023-04-11 DIAGNOSIS — G4733 Obstructive sleep apnea (adult) (pediatric): Secondary | ICD-10-CM | POA: Diagnosis not present

## 2023-04-14 DIAGNOSIS — M65322 Trigger finger, left index finger: Secondary | ICD-10-CM | POA: Diagnosis not present

## 2023-04-14 DIAGNOSIS — M19042 Primary osteoarthritis, left hand: Secondary | ICD-10-CM | POA: Diagnosis not present

## 2023-04-17 ENCOUNTER — Ambulatory Visit (INDEPENDENT_AMBULATORY_CARE_PROVIDER_SITE_OTHER): Payer: Medicare Other | Admitting: *Deleted

## 2023-04-17 VITALS — Ht 61.0 in | Wt 170.0 lb

## 2023-04-17 DIAGNOSIS — Z0000 Encounter for general adult medical examination without abnormal findings: Secondary | ICD-10-CM

## 2023-04-17 DIAGNOSIS — R001 Bradycardia, unspecified: Secondary | ICD-10-CM | POA: Diagnosis not present

## 2023-04-17 NOTE — Patient Instructions (Signed)
 Jennifer [name] , Thank you for taking time to come for your Medicare Wellness Visit. I appreciate your ongoing commitment to your health goals. Please review the following plan we discussed and let me know if I can assist you in the future.   Referrals/Orders/Follow-Ups/Clinician Recommendations: None  This is a list of the screening recommended for you and due dates:  Health Maintenance  Topic Date Due   Eye exam for diabetics  09/18/2022   Yearly kidney health urinalysis for diabetes  04/29/2023   Complete foot exam   04/29/2023   COVID-19 Vaccine (9 - Pfizer risk 2024-25 season) 05/15/2023   Hemoglobin A1C  05/25/2023   Yearly kidney function blood test for diabetes  12/19/2023   Medicare Annual Wellness Visit  04/16/2024   Mammogram  08/24/2024   Colon Cancer Screening  05/05/2027   DTaP/Tdap/Td vaccine (3 - Td or Tdap) 08/06/2028   Pneumonia Vaccine  Completed   Flu Shot  Completed   DEXA scan (bone density measurement)  Completed   Hepatitis C Screening  Completed   Zoster (Shingles) Vaccine  Completed   HPV Vaccine  Aged Out    Advanced directives: (Copy Requested) Please bring a copy of your health care power of attorney and living will to the office to be added to your chart at your convenience. You can mail to [facility] [address] [place], [place] [address] or email to [email or web address]  Next Medicare Annual Wellness Visit scheduled for next year: Yes 04/19/24 @ 9:30

## 2023-04-17 NOTE — Progress Notes (Signed)
 Subjective:   Jennifer [name] is a 73 y.o. who presents for a Medicare Wellness preventive visit.  Visit Complete: Virtual I connected with  Jennifer [name] on 04/17/23 by a audio enabled telemedicine application and verified that I am speaking with the correct person using two identifiers.  Patient Location: Home  Provider Location: Office/Clinic  I discussed the limitations of evaluation and management by telemedicine. The patient expressed understanding and agreed to proceed.  Vital Signs: Because this visit was a virtual/telehealth visit, some criteria may be missing or patient reported. Any vitals not documented were not able to be obtained and vitals that have been documented are patient reported.  VideoDeclined- This patient declined Librarian, academic. Therefore the visit was completed with audio only.  Persons Participating in Visit: Patient.  AWV Questionnaire: No: Patient Medicare AWV questionnaire was not completed prior to this visit.  Cardiac Risk Factors include: advanced age (>26men, >39 women);diabetes mellitus;dyslipidemia;hypertension;obesity (BMI >30kg/m2)     Objective:    Today's Vitals   04/17/23 0934  Weight: 170 lb (77.1 kg)  Height: 5\' 1"  (1.549 m)   Body mass index is 32.12 kg/m.     04/17/2023    9:48 AM 12/20/2022    1:21 PM 09/15/2022    4:21 PM 07/20/2022    3:59 PM 07/13/2022    1:53 PM 07/13/2022    1:17 PM 05/05/2022   11:58 AM  Advanced Directives  Does Patient Have a Medical Advance Directive? Yes Yes Yes Yes Yes Yes Yes  Type of Estate agent of [place];Living will Living will;Healthcare Power of Attorney Living will  Living will;Healthcare Power of Attorney Living will;Healthcare Power of [employer] Power of [place];Living will  Does patient want to make changes to medical advance directive?   No - Patient declined No - Patient declined No - Patient declined    Copy of  Healthcare Power of Attorney in Chart? No - copy requested No - copy requested   No - copy requested No - copy requested No - copy requested  Would patient like information on creating a medical advance directive?  No - Patient declined   No - Patient declined      Current Medications (verified) Outpatient Encounter Medications as of 04/17/2023  Medication Sig   acetaminophen (TYLENOL) 325 MG tablet Take 2 tablets (650 mg total) by mouth every 6 (six) hours as needed for mild pain (or Fever >/= 101).   acyclovir (ZOVIRAX) 400 MG tablet Take 1 tablet (400 mg total) by mouth 2 (two) times daily.   famotidine (PEPCID) 20 MG tablet Take 1 tablet (20 mg total) by mouth at bedtime as needed for heartburn or indigestion.   FLUoxetine (PROZAC) 20 MG capsule Take 40 mg by mouth daily.   fluticasone (FLONASE) 50 MCG/ACT nasal spray INSTILL 1 SPRAY INTO BOTH NOSTRILS DAILY   folic acid (FOLVITE) 1 MG tablet Take 1 tablet (1 mg total) by mouth daily.   gabapentin (NEURONTIN) 100 MG capsule Take 100mg  PO qam and 100mg  evening.   gabapentin (NEURONTIN) 300 MG capsule Take 900mg  qam, 300mg  at noon, and 900mg  at bedtime.   hydrocortisone (ANUSOL-HC) 25 MG suppository Unwrap and insert 1 suppository rectally twice a day   levothyroxine (SYNTHROID) 50 MCG tablet Take 1 tablet (50 mcg total) by mouth daily.   losartan (COZAAR) 100 MG tablet TAKE 1 TABLET BY MOUTH EVERYDAY AT BEDTIME (Patient taking differently: Take 100 mg by mouth daily. TAKE 1 TABLET BY MOUTH  EVERYDAY AT BEDTIME)   Multiple Vitamin (MULTIVITAMIN WITH MINERALS) TABS tablet Take 1 tablet by mouth daily.   naltrexone (DEPADE) 50 MG tablet Take by mouth daily. Patient takes  one  50 mg tablet daily at the same time   potassium chloride SA (KLOR-CON M) 20 MEQ tablet Take 1 tablet (20 mEq total) by mouth daily.   rosuvastatin (CRESTOR) 5 MG tablet Take 2.5 mg by mouth daily.   sodium chloride (OCEAN) 0.65 % SOLN nasal spray Place 2 sprays into both  nostrils as needed for congestion.   torsemide (DEMADEX) 20 MG tablet Take 1 tablet (20 mg total) by mouth daily.   traZODone (DESYREL) 50 MG tablet TAKE 1 TABLET BY MOUTH EVERY DAY FOR SLEEP   triamterene-hydrochlorothiazide (DYAZIDE) 37.5-25 MG capsule TAKE 1 EACH (1 CAPSULE TOTAL) BY MOUTH DAILY.   omeprazole (PRILOSEC) 40 MG capsule Take 40 mg by mouth daily. (Patient not taking: Reported on 04/17/2023)   No facility-administered encounter medications on file as of 04/17/2023.    Allergies (verified) Bee pollen, Celecoxib, Hydrocodone, Pollen extract, Sodium ferric gluconate [ferrous gluconate], Nasacort [triamcinolone], and Vicodin [hydrocodone-acetaminophen]   History: Past Medical History:  Diagnosis Date   Anemia    Anxiety    Bariatric surgery status    Complication of anesthesia    Diificulty breathing for about 15 minutes after bariatric surgery   Constipation    COVID-19    Dysrhythmia    Elevated liver enzymes    GERD (gastroesophageal reflux disease)    Hemorrhoids    Herpes genitalis    High cholesterol    Hyperlipidemia    Hypertension    Hypothyroidism    IDA (iron deficiency anemia) 02/09/2021   Neuropathy    Osteoarthritis    Sleep apnea    CPAP   Past Surgical History:  Procedure Laterality Date   ABDOMINAL HYSTERECTOMY     total for fibroids no h/o abnormal pap   bariatric sleeve  2015   BREAST EXCISIONAL BIOPSY Left 1998   carpal tunnel repair     CATARACT EXTRACTION W/PHACO Right 11/01/2021   Procedure: CATARACT EXTRACTION PHACO AND INTRAOCULAR LENS PLACEMENT (IOC) RIGHT;  Surgeon: Nevada Crane, MD;  Location: [facility];  Service: Ophthalmology;  Laterality: Right;  sleep apnea 4.95 00:49.7   CATARACT EXTRACTION W/PHACO Left 11/15/2021   Procedure: CATARACT EXTRACTION PHACO AND INTRAOCULAR LENS PLACEMENT (IOC) LEFT 4.94 00:32.3;  Surgeon: Nevada Crane, MD;  Location[facility];  Service: Ophthalmology;  Laterality:  Left;  sleep apnea   COLONOSCOPY WITH PROPOFOL N/A 02/11/2016   Procedure: COLONOSCOPY WITH PROPOFOL;  Surgeon: Wyline Mood, MD;  Location: [facility] END[facility];  Service: Endoscopy;  Laterality: N/A;   COLONOSCOPY WITH PROPOFOL N/A 10/01/2019   Procedure: COLONOSCOPY WITH PROPOFOL;  Surgeon: Wyline Mood, MD;  Location: [facility] ENDOSCOPY;  Service: Gastroenterology;  Laterality: N/A;   COLONOSCOPY WITH PROPOFOL N/A 11/19/2020   Procedure: COLONOSCOPY WITH PROPOFOL;  Surgeon: Wyline Mood, MD;  Location: [facility];  Service: Gastroenterology;  Laterality: N/A;   COLONOSCOPY WITH PROPOFOL N/A 05/05/2022   Procedure: COLONOSCOPY WITH PROPOFOL;  Surgeon: Regis Bill, MD;  Location: [facility] ENDOSCOPY;  Service: Endoscopy;  Laterality: N/A;   ESOPHAGOGASTRODUODENOSCOPY (EGD) WITH PROPOFOL N/A 12/02/2019   Procedure: ESOPHAGOGASTRODUODENOSCOPY (EGD) WITH PROPOFOL;  Surgeon: Wyline Mood, MD;  Location: [facility];  Service: Gastroenterology;  Laterality: N/A;   ESOPHAGOGASTRODUODENOSCOPY (EGD) WITH PROPOFOL N/A 11/19/2020   Procedure: ESOPHAGOGASTRODUODENOSCOPY (EGD) WITH PROPOFOL;  Surgeon: Wyline Mood, MD;  Location:  [facility] ENDOSCOPY;  Service: Gastroenterology;  Laterality: N/A;   ESOPHAGOGASTRODUODENOSCOPY (EGD) WITH PROPOFOL N/A 05/05/2022   Procedure: ESOPHAGOGASTRODUODENOSCOPY (EGD) WITH PROPOFOL;  Surgeon: Regis Bill, MD;  Location: [facility] END[facility]COPY;  Service: Endoscopy;  Laterality: N/A;   FLEXIBLE SIGMOIDOSCOPY N/A 02/06/2021   Procedure: FLEXIBLE SIGMOIDOSCOPY;  Surgeon: Jaynie Collins, DO;  Location: [facility] ENDOSCOPY;  Service: Gastroenterology;  Laterality: N/A;   GIVENS CAPSULE STUDY N/A 06/07/2021   Procedure: GIVENS CAPSULE STUDY;  Surgeon: Toney Reil, MD;  Location: [facility] END[facility]COPY;  Service: Gastroenterology;  Laterality: N/A;   GIVENS CAPSULE STUDY N/A 06/09/2021   Procedure: GIVENS CAPSULE STUDY;  Surgeon: Toney Reil, MD;  Location: [facility] ENDOSCOPY;  Service:  Gastroenterology;  Laterality: N/A;   HEMORRHOID SURGERY     LAPAROSCOPIC GASTRIC RESTRICTIVE DUODENAL PROCEDURE (DUODENAL SWITCH) Bilateral    2020   Family History  Problem Relation Age of Onset   Hypertension Mother    Heart disease Father    Alcohol abuse Father    Breast cancer Sister 62       materal 1/2 sister   Hypertension Sister    Hypertension Brother    Social History   Socioeconomic History   Marital status: Married    Spouse name: Not on file   Number of children: Not on file   Years of education: Not on file   Highest education level: 12th grade  Occupational History   Not on file  Tobacco Use   Smoking status: Former    Current packs/day: 0.00    Average packs/day: 1.5 packs/day for 24.0 years (36.0 ttl pk-yrs)    Types: Cigarettes    Start date: 12    Quit date: 46    Years since quitting: 32.2   Smokeless tobacco: Never   Tobacco comments:    quit 1995.   Vaping Use   Vaping status: Never Used  Substance and Sexual Activity   Alcohol use: Not Currently    Alcohol/week: 14.0 standard drinks of alcohol    Types: 14 Glasses of wine per week   Drug use: No   Sexual activity: Not Currently    Birth control/protection: Surgical    Comment: Hysterectomy  Other Topics Concern   Not on file  Social History Narrative   Lives in [place].    Married.    Retired 2015, Engineer, structural.    One son; granddaughter.    Left handed    Caffeine- decaf coffee.    Social Drivers of Corporate investment banker Strain: Low Risk  (04/17/2023)   Overall Financial Resource Strain (CARDIA)    Difficulty of Paying Living Expenses: Not hard at all  Food Insecurity: No Food Insecurity (04/17/2023)   Hunger Vital Sign    Worried About Running Out of Food in the Last Year: Never true    Ran Out of Food in the Last Year: Never true  Transportation Needs: No Transportation Needs (04/17/2023)   PRAPARE - Administrator, Civil Service (Medical): No     Lack of Transportation (Non-Medical): No  Physical Activity: Insufficiently Active (04/17/2023)   Exercise Vital Sign    Days of Exercise per Week: 3 days    Minutes of Exercise per Session: 20 min  Stress: No Stress Concern Present (04/17/2023)   [employer] of Occupational Health - Occupational Stress Questionnaire    Feeling of Stress : Only a little  Social Connections: Socially Isolated (04/17/2023)   Social Connection and Isolation Panel [  NHANES]    Frequency of Communication with Friends and Family: Once a week    Frequency of Social Gatherings with Friends and Family: Once a week    Attends Religious Services: Never    Database administrator or Organizations: No    Attends Banker Meetings: Never    Marital Status: Married    Tobacco Counseling Counseling given: Not Answered Tobacco comments: quit 1995.     Clinical Intake:  Pre-visit preparation completed: Yes  Pain : No/denies pain     BMI - recorded: 32.12 Nutritional Status: BMI > 30  Obese Nutritional Risks: None Diabetes: Yes (controlled per patient) CBG done?: No Did pt. bring in CBG monitor from home?: No  Lab Results  Component Value Date   HGBA1C 5.8 11/25/2022   HGBA1C 5.5 08/19/2021   HGBA1C 5.1 10/14/2020     How often do you need to have someone help you when you read instructions, pamphlets, or other written materials from your doctor or pharmacy?: 1 - Never  Interpreter Needed?: No  Information entered by :: R. Kegan Shepardson LPN   Activities of Daily Living     04/17/2023    9:37 AM 05/04/2022    1:00 AM  In your present state of health, do you have any difficulty performing the following activities:  Hearing? 0 0  Vision? 0 0  Difficulty concentrating or making decisions? 0 0  Walking or climbing stairs? 0 0  Dressing or bathing? 0 0  Doing errands, shopping? 0 0  Preparing Food and eating ? N   Using the Toilet? N   In the past six months, have you accidently leaked  urine? N   Do you have problems with loss of bowel control? N   Managing your Medications? N   Managing your Finances? N   Housekeeping or managing your Housekeeping? N     Patient Care Team: Allegra Grana, FNP as PCP - General (Family Medicine) Linna Darner, RD as Dietitian (Family Medicine) Rickard Patience, MD as Consulting Physician (Hematology and Oncology) Erin Fulling, MD as Consulting Physician (Pulmonary Disease) Glenford Bayley, NP as Nurse Practitioner (Pulmonary Disease)  Indicate any recent Medical Services you may have received from other than [facility] providers in the past year (date may be approximate).     Assessment:   This is a routine wellness examination for [place].  Hearing/Vision screen Hearing Screening - Comments:: No issues Vision Screening - Comments:: readers   Goals Addressed             This Visit's Progress    Patient Stated       Wants to exercise more and control eating hablits       Depression Screen     04/17/2023    9:44 AM 03/28/2023   10:10 AM 01/31/2023   11:12 AM 12/27/2022    9:26 AM 11/25/2022    9:10 AM 09/22/2022   10:39 AM 08/23/2022    8:39 AM  PHQ 2/9 Scores  PHQ - 2 Score 0 0 0 0 0 0 0  PHQ- 9 Score 1     1 0    Fall Risk     04/17/2023    9:39 AM 03/28/2023   10:11 AM 03/28/2023   10:10 AM 01/31/2023   11:11 AM 12/27/2022    9:26 AM  Fall Risk   Falls in the past year? 0 0 0 0 0  Number falls in past yr: 0 0 0  0 0  Injury with Fall? 0 0 0 0 0  Risk for fall due to : No Fall Risks No Fall Risks No Fall Risks No Fall Risks No Fall Risks  Follow up Falls prevention discussed;Falls evaluation completed Falls evaluation completed Falls evaluation completed Falls evaluation completed Falls evaluation completed    MEDICARE RISK AT HOME:  Medicare Risk at Home Any stairs in or around the home?: Yes If so, are there any without handrails?: No Home free of loose throw rugs in walkways, pet beds, electrical cords, etc?:  Yes Adequate lighting in your home to reduce risk of falls?: Yes Life alert?: No Use of a cane, walker or w/c?: Yes Grab bars in the bathroom?: Yes Shower chair or bench in shower?: Yes Elevated toilet seat or a handicapped toilet?: No  TIMED UP AND GO:  Was the test performed?  No  Cognitive Function: 6CIT completed    08/31/2017   10:56 AM  MMSE - Mini Mental State Exam  Orientation to time 5  Orientation to Place 5  Registration 3  Attention/ Calculation 5  Recall 3  Language- name 2 objects 2  Language- repeat 1  Language- follow 3 step command 3  Language- read & follow direction 1  Write a sentence 1  Copy design 1  Total score 30        04/17/2023    9:49 AM 09/28/2021    1:51 PM 09/06/2019    9:32 AM 09/05/2018   10:41 AM  6CIT Screen  What Year? 0 points 0 points 0 points 0 points  What month? 0 points 0 points 0 points 0 points  What time? 0 points 0 points 0 points 0 points  Count back from 20 0 points 0 points 0 points 0 points  Months in reverse 0 points 0 points 0 points 0 points  Repeat phrase 0 points 0 points 0 points 0 points  Total Score 0 points 0 points 0 points 0 points    Immunizations Immunization History  Administered Date(s) Administered   Fluad Quad(high Dose 65+) 10/02/2020, 10/05/2021   Fluad Trivalent(High Dose 65+) 09/22/2022   Hep A / Hep B 05/24/2013, 11/21/2013   Influenza Split 10/21/2013   Influenza, High Dose Seasonal PF 10/24/2016, 09/06/2018   Influenza,inj,Quad PF,6+ Mos 11/08/2013, 10/28/2014   Influenza-Unspecified 12/05/2011, 10/28/2015, 10/24/2016, 09/06/2017, 10/18/2019   PFIZER(Purple Top)SARS-COV-2 Vaccination 03/15/2019, 04/09/2019, 04/09/2019, 11/01/2019, 08/07/2020   Pfizer Covid-19 Vaccine Bivalent Booster 80yrs & up 07/15/2021   Pfizer(Comirnaty)Fall Seasonal Vaccine 12 years and older 10/29/2021, 11/14/2022   Pneumococcal Conjugate-13 12/05/2014, 04/06/2016   Pneumococcal Polysaccharide-23 06/05/2017   Tdap  02/05/2014, 08/07/2018   Zoster Recombinant(Shingrix) 12/25/2018, 10/07/2021   Zoster, Live 01/09/2012, 12/05/2014    Screening Tests Health Maintenance  Topic Date Due   OPHTHALMOLOGY EXAM  09/18/2022   Diabetic kidney evaluation - Urine ACR  04/29/2023   FOOT EXAM  04/29/2023   COVID-19 Vaccine (9 - Pfizer risk 2024-25 season) 05/15/2023   HEMOGLOBIN A1C  05/25/2023   Diabetic kidney evaluation - eGFR measurement  12/19/2023   Medicare Annual Wellness (AWV)  04/16/2024   MAMMOGRAM  08/24/2024   Colonoscopy  05/05/2027   DTaP/Tdap/Td (3 - Td or Tdap) 08/06/2028   Pneumonia Vaccine 39+ Years old  Completed   INFLUENZA VACCINE  Completed   DEXA SCAN  Completed   Hepatitis C Screening  Completed   Zoster Vaccines- Shingrix  Completed   HPV VACCINES  Aged Out    Health Maintenance  Health Maintenance Due  Topic Date Due   OPHTHALMOLOGY EXAM  09/18/2022   Diabetic kidney evaluation - Urine ACR  04/29/2023   Health Maintenance Items Addressed: See Nurse Notes  Additional Screening:  Vision Screening: Recommended annual ophthalmology exams for early detection of glaucoma and other disorders of the eye. Up to date, [facility]  Will request office notes  Dental Screening: Recommended annual dental exams for proper oral hygiene  Community Resource Referral / Chronic Care Management: CRR required this visit?  No   CCM required this visit?  No     Plan:     I have personally reviewed and noted the following in the patient's chart:   Medical and social history Use of alcohol, tobacco or illicit drugs  Current medications and supplements including opioid prescriptions. Patient is not currently taking opioid prescriptions. Functional ability and status Nutritional status Physical activity Advanced directives List of other physicians Hospitalizations, surgeries, and ER visits in previous 12 months Vitals Screenings to include cognitive, depression, and  falls Referrals and appointments  In addition, I have reviewed and discussed with patient certain preventive protocols, quality metrics, and best practice recommendations. A written personalized care plan for preventive services as well as general preventive health recommendations were provided to patient.     Sydell Axon, LPN   1/61/0960   After Visit Summary: (MyChart) Due to this being a telephonic visit, the after visit summary with patients personalized plan was offered to patient via MyChart   Notes: Nothing significant to report at this time.

## 2023-04-19 DIAGNOSIS — M5416 Radiculopathy, lumbar region: Secondary | ICD-10-CM | POA: Diagnosis not present

## 2023-04-19 DIAGNOSIS — M48062 Spinal stenosis, lumbar region with neurogenic claudication: Secondary | ICD-10-CM | POA: Diagnosis not present

## 2023-04-20 DIAGNOSIS — H04123 Dry eye syndrome of bilateral lacrimal glands: Secondary | ICD-10-CM | POA: Diagnosis not present

## 2023-04-20 DIAGNOSIS — H5712 Ocular pain, left eye: Secondary | ICD-10-CM | POA: Diagnosis not present

## 2023-04-20 DIAGNOSIS — H0289 Other specified disorders of eyelid: Secondary | ICD-10-CM | POA: Diagnosis not present

## 2023-04-20 DIAGNOSIS — Z961 Presence of intraocular lens: Secondary | ICD-10-CM | POA: Diagnosis not present

## 2023-04-27 DIAGNOSIS — R001 Bradycardia, unspecified: Secondary | ICD-10-CM | POA: Diagnosis not present

## 2023-04-27 DIAGNOSIS — E66811 Obesity, class 1: Secondary | ICD-10-CM | POA: Diagnosis not present

## 2023-04-27 DIAGNOSIS — G4733 Obstructive sleep apnea (adult) (pediatric): Secondary | ICD-10-CM | POA: Diagnosis not present

## 2023-04-27 DIAGNOSIS — I10 Essential (primary) hypertension: Secondary | ICD-10-CM | POA: Diagnosis not present

## 2023-04-27 DIAGNOSIS — Z87891 Personal history of nicotine dependence: Secondary | ICD-10-CM | POA: Diagnosis not present

## 2023-04-27 DIAGNOSIS — I872 Venous insufficiency (chronic) (peripheral): Secondary | ICD-10-CM | POA: Diagnosis not present

## 2023-04-27 DIAGNOSIS — E7849 Other hyperlipidemia: Secondary | ICD-10-CM | POA: Diagnosis not present

## 2023-04-27 DIAGNOSIS — I2510 Atherosclerotic heart disease of native coronary artery without angina pectoris: Secondary | ICD-10-CM | POA: Diagnosis not present

## 2023-05-02 ENCOUNTER — Telehealth: Payer: Self-pay | Admitting: Oncology

## 2023-05-02 DIAGNOSIS — R531 Weakness: Secondary | ICD-10-CM | POA: Diagnosis not present

## 2023-05-02 DIAGNOSIS — I959 Hypotension, unspecified: Secondary | ICD-10-CM | POA: Diagnosis not present

## 2023-05-02 DIAGNOSIS — R001 Bradycardia, unspecified: Secondary | ICD-10-CM | POA: Diagnosis not present

## 2023-05-02 NOTE — Telephone Encounter (Signed)
 Solveig called and left message with our answering service today at 2:33 pm. She wants to set an appt up for testing. There are no labs that were previously missed. She said please call and if you can't reach her cell phone, call home phone [phone].

## 2023-05-03 ENCOUNTER — Telehealth: Payer: Self-pay | Admitting: Oncology

## 2023-05-03 ENCOUNTER — Other Ambulatory Visit: Payer: Self-pay

## 2023-05-03 DIAGNOSIS — D500 Iron deficiency anemia secondary to blood loss (chronic): Secondary | ICD-10-CM

## 2023-05-03 NOTE — Telephone Encounter (Signed)
 Pt called and wants to have labs and see MD sooner than next appt (6/2 & 6/3). Pt stated she is feeling tired. Please advise on scheduling and I will call pt back to let her know update.  (It does look that right now the MD schedule is full until May)

## 2023-05-05 ENCOUNTER — Inpatient Hospital Stay: Attending: Oncology

## 2023-05-05 DIAGNOSIS — D509 Iron deficiency anemia, unspecified: Secondary | ICD-10-CM | POA: Diagnosis not present

## 2023-05-05 DIAGNOSIS — Z9884 Bariatric surgery status: Secondary | ICD-10-CM | POA: Insufficient documentation

## 2023-05-05 DIAGNOSIS — D500 Iron deficiency anemia secondary to blood loss (chronic): Secondary | ICD-10-CM

## 2023-05-05 LAB — CBC WITH DIFFERENTIAL (CANCER CENTER ONLY)
Abs Immature Granulocytes: 0.01 10*3/uL (ref 0.00–0.07)
Basophils Absolute: 0 10*3/uL (ref 0.0–0.1)
Basophils Relative: 1 %
Eosinophils Absolute: 0 10*3/uL (ref 0.0–0.5)
Eosinophils Relative: 1 %
HCT: 28 % — ABNORMAL LOW (ref 36.0–46.0)
Hemoglobin: 8.6 g/dL — ABNORMAL LOW (ref 12.0–15.0)
Immature Granulocytes: 0 %
Lymphocytes Relative: 31 %
Lymphs Abs: 1.7 10*3/uL (ref 0.7–4.0)
MCH: 24.2 pg — ABNORMAL LOW (ref 26.0–34.0)
MCHC: 30.7 g/dL (ref 30.0–36.0)
MCV: 78.9 fL — ABNORMAL LOW (ref 80.0–100.0)
Monocytes Absolute: 0.6 10*3/uL (ref 0.1–1.0)
Monocytes Relative: 10 %
Neutro Abs: 3.2 10*3/uL (ref 1.7–7.7)
Neutrophils Relative %: 57 %
Platelet Count: 141 10*3/uL — ABNORMAL LOW (ref 150–400)
RBC: 3.55 MIL/uL — ABNORMAL LOW (ref 3.87–5.11)
RDW: 15.1 % (ref 11.5–15.5)
WBC Count: 5.5 10*3/uL (ref 4.0–10.5)
nRBC: 0 % (ref 0.0–0.2)

## 2023-05-05 LAB — IRON AND TIBC
Iron: 19 ug/dL — ABNORMAL LOW (ref 28–170)
Saturation Ratios: 4 % — ABNORMAL LOW (ref 10.4–31.8)
TIBC: 461 ug/dL — ABNORMAL HIGH (ref 250–450)
UIBC: 442 ug/dL

## 2023-05-05 LAB — SAMPLE TO BLOOD BANK

## 2023-05-05 LAB — RETIC PANEL
Immature Retic Fract: 23.9 % — ABNORMAL HIGH (ref 2.3–15.9)
RBC.: 3.57 MIL/uL — ABNORMAL LOW (ref 3.87–5.11)
Retic Count, Absolute: 56.8 10*3/uL (ref 19.0–186.0)
Retic Ct Pct: 1.6 % (ref 0.4–3.1)
Reticulocyte Hemoglobin: 22.4 pg — ABNORMAL LOW (ref >27.9)

## 2023-05-05 LAB — FERRITIN: Ferritin: 8 ng/mL — ABNORMAL LOW (ref 11–307)

## 2023-05-06 ENCOUNTER — Other Ambulatory Visit: Payer: Self-pay | Admitting: Family

## 2023-05-09 ENCOUNTER — Telehealth: Payer: Self-pay

## 2023-05-09 DIAGNOSIS — E66811 Obesity, class 1: Secondary | ICD-10-CM | POA: Diagnosis not present

## 2023-05-09 DIAGNOSIS — G4733 Obstructive sleep apnea (adult) (pediatric): Secondary | ICD-10-CM | POA: Diagnosis not present

## 2023-05-09 DIAGNOSIS — I872 Venous insufficiency (chronic) (peripheral): Secondary | ICD-10-CM | POA: Diagnosis not present

## 2023-05-09 DIAGNOSIS — I959 Hypotension, unspecified: Secondary | ICD-10-CM | POA: Diagnosis not present

## 2023-05-09 DIAGNOSIS — D500 Iron deficiency anemia secondary to blood loss (chronic): Secondary | ICD-10-CM | POA: Diagnosis not present

## 2023-05-09 DIAGNOSIS — E7849 Other hyperlipidemia: Secondary | ICD-10-CM | POA: Diagnosis not present

## 2023-05-09 DIAGNOSIS — M7989 Other specified soft tissue disorders: Secondary | ICD-10-CM | POA: Diagnosis not present

## 2023-05-09 DIAGNOSIS — R001 Bradycardia, unspecified: Secondary | ICD-10-CM | POA: Diagnosis not present

## 2023-05-09 NOTE — Telephone Encounter (Signed)
-----   Message from Timmy Forbes sent at 05/08/2023  9:54 PM EDT ----- Hemoglobin had dropped. Please move her appt up to this week. MD + Venofer . Thanks.

## 2023-05-09 NOTE — Telephone Encounter (Signed)
 Dr. Wilhelmenia Harada would like appt moved up to this week: MD/ Venofer . Please contact pt to move up appt.

## 2023-05-11 ENCOUNTER — Encounter: Payer: Self-pay | Admitting: Oncology

## 2023-05-11 ENCOUNTER — Inpatient Hospital Stay (HOSPITAL_BASED_OUTPATIENT_CLINIC_OR_DEPARTMENT_OTHER): Admitting: Oncology

## 2023-05-11 ENCOUNTER — Inpatient Hospital Stay

## 2023-05-11 VITALS — BP 129/64 | HR 53 | Temp 99.3°F | Resp 18 | Wt 177.0 lb

## 2023-05-11 DIAGNOSIS — D500 Iron deficiency anemia secondary to blood loss (chronic): Secondary | ICD-10-CM

## 2023-05-11 DIAGNOSIS — Z9884 Bariatric surgery status: Secondary | ICD-10-CM | POA: Diagnosis not present

## 2023-05-11 DIAGNOSIS — D62 Acute posthemorrhagic anemia: Secondary | ICD-10-CM

## 2023-05-11 DIAGNOSIS — D509 Iron deficiency anemia, unspecified: Secondary | ICD-10-CM | POA: Diagnosis not present

## 2023-05-11 MED ORDER — IRON SUCROSE 20 MG/ML IV SOLN
200.0000 mg | Freq: Once | INTRAVENOUS | Status: AC
  Administered 2023-05-11: 200 mg via INTRAVENOUS
  Filled 2023-05-11: qty 10

## 2023-05-11 NOTE — Assessment & Plan Note (Signed)
Continue bariatric multivitamin.  ?

## 2023-05-11 NOTE — Progress Notes (Signed)
 Hematology/Oncology Progress note [phone] [phone]         Patient Care Team: Calista Catching, FNP as PCP - General (Family Medicine) Dorothe Gaster, RD as Dietitian (Family Medicine) Timmy Forbes, MD as Consulting Physician (Hematology and Oncology) Cleve Dale, MD as Consulting Physician (Pulmonary Disease) Antonio Baumgarten, NP as Nurse Practitioner (Pulmonary Disease) Pa, [facility] (Optometry)  ASSESSMENT & PLAN:   IDA (iron  deficiency anemia) #Iron  deficiency anemia due to chronic blood loss. Labs reviewed and discussed with patient. Lab Results  Component Value Date   HGB 8.6 (L) 05/05/2023   TIBC 461 (H) 05/05/2023   IRONPCTSAT 4 (L) 05/05/2023   FERRITIN 8 (L) 05/05/2023   Recommend Venofer  weekly x 4    History of bariatric surgery Continue bariatric multivitamin  Recommend patient to follow up with GI for evaluation of IDA No orders of the defined types were placed in this encounter.  Follow up in 3 months All questions were answered. The patient knows to call the clinic with any problems, questions or concerns.  Timmy Forbes, MD, PhD [facility] Hematology Oncology 05/11/2023   CHIEF COMPLAINTS/REASON FOR VISIT:  Follow-up for iron  deficiency anemia.  HISTORY OF PRESENTING ILLNESS:   Jennifer [name] is a  73 y.o.  female with PMH listed below was seen in consultation at the request of  Calista Catching, FNP  for evaluation of iron  deficiency anemia  Patient was admitted from 02/05/2021 - 02/06/2021 due to symptomatic anemia with bright red blood per rectum.  Initial hemoglobin at presentation was 5.9.  Patient was transfused with 2 units of PRBC.  Also received 1 dose of IV Venofer  treatments.  Patient had a flexible sigmoidoscopy by Dr. Mamie Searles.  Findings of nonbleeding hemorrhoids.  Patient follows up with Dr. Antony Baumgartner outpatient.  Patient was previously taking aspirin which has been discontinued.  Patient had  hospitalization from 11/17/2020 - 11/19/2020 due to symptomatic anemia secondary to intermittent BPBPR/melena.  She received blood transfusion during that hospitalization.  She also had a reaction to ferrous gluconate. She had EGD and colonoscopy on 11/19/2020 with no notable findings of active bleeding.   Patient has a history of bariatric surgery.  05/03/2022 -05/05/2022 Admitted due to melena. Acute blood loss anemia. underwent CT angio GI bleed with no active GI hemorrhage but liver steatosis.  status post upper and lower endoscopy. Lower endoscopy showed internal hemorrhoids and upper endoscopy was within normal limits.   INTERVAL HISTORY Jennifer [name] is a 73 y.o. female who has above history reviewed by me today presents for follow up visit for iron  deficiency anemia.  She feels more tired lately. Denies hematochezia, hematuria, hematemesis, epistaxis, black tarry stool or easy bruising.    Review of Systems  Constitutional:  Positive for fatigue. Negative for appetite change, chills and fever.  HENT:   Negative for hearing loss and voice change.   Eyes:  Negative for eye problems.  Respiratory:  Negative for chest tightness and cough.   Cardiovascular:  Negative for chest pain.  Gastrointestinal:  Negative for abdominal distention, abdominal pain and blood in stool.  Endocrine: Negative for hot flashes.  Genitourinary:  Negative for difficulty urinating and frequency.   Musculoskeletal:  Negative for arthralgias.  Skin:  Negative for itching and rash.  Neurological:  Negative for extremity weakness.  Hematological:  Negative for adenopathy.  Psychiatric/Behavioral:  Negative for confusion.     MEDICAL HISTORY:  Past Medical History:  Diagnosis Date   Anemia  Anxiety    Bariatric surgery status    Complication of anesthesia    Diificulty breathing for about 15 minutes after bariatric surgery   Constipation    COVID-19    Dysrhythmia    Elevated liver enzymes    GERD  (gastroesophageal reflux disease)    Hemorrhoids    Herpes genitalis    High cholesterol    Hyperlipidemia    Hypertension    Hypothyroidism    IDA (iron  deficiency anemia) 02/09/2021   Neuropathy    Osteoarthritis    Sleep apnea    CPAP    SURGICAL HISTORY: Past Surgical History:  Procedure Laterality Date   ABDOMINAL HYSTERECTOMY     total for fibroids no h/o abnormal pap   bariatric sleeve  2015   BREAST EXCISIONAL BIOPSY Left 1998   carpal tunnel repair     CATARACT EXTRACTION W/PHACO Right 11/01/2021   Procedure: CATARACT EXTRACTION PHACO AND INTRAOCULAR LENS PLACEMENT (IOC) RIGHT;  Surgeon: Rosa College, MD;  Location: [facility];  Service: Ophthalmology;  Laterality: Right;  sleep apnea 4.95 00:49.7   CATARACT EXTRACTION W/PHACO Left 11/15/2021   Procedure: CATARACT EXTRACTION PHACO AND INTRAOCULAR LENS PLACEMENT (IOC) LEFT 4.94 00:32.3;  Surgeon: Rosa College, MD;  Location: [facility];  Service: Ophthalmology;  Laterality: Left;  sleep apnea   COLONOSCOPY WITH PROPOFOL  N/A 02/11/2016   Procedure: COLONOSCOPY WITH PROPOFOL ;  Surgeon: Luke Salaam, MD;  Location: [facility] ENDOSCOPY;  Service: Endoscopy;  Laterality: N/A;   COLONOSCOPY WITH PROPOFOL  N/A 10/01/2019   Procedure: COLONOSCOPY WITH PROPOFOL ;  Surgeon: Luke Salaam, MD;  Location: [facility] ENDOSCOPY;  Service: Gastroenterology;  Laterality: N/A;   COLONOSCOPY WITH PROPOFOL  N/A 11/19/2020   Procedure: COLONOSCOPY WITH PROPOFOL ;  Surgeon: Luke Salaam, MD;  Location: [facility] ENDOSCOPY;  Service: Gastroenterology;  Laterality: N/A;   COLONOSCOPY WITH PROPOFOL  N/A 05/05/2022   Procedure: COLONOSCOPY WITH PROPOFOL ;  Surgeon: Shane Darling, MD;  Location: [facility] ENDOSCOPY;  Service: Endoscopy;  Laterality: N/A;   ESOPHAGOGASTRODUODENOSCOPY (EGD) WITH PROPOFOL  N/A 12/02/2019   Procedure: ESOPHAGOGASTRODUODENOSCOPY (EGD) WITH PROPOFOL ;  Surgeon: Luke Salaam, MD;  Location: [facility] ENDOSCOPY;  Service:  Gastroenterology;  Laterality: N/A;   ESOPHAGOGASTRODUODENOSCOPY (EGD) WITH PROPOFOL  N/A 11/19/2020   Procedure: ESOPHAGOGASTRODUODENOSCOPY (EGD) WITH PROPOFOL ;  Surgeon: Luke Salaam, MD;  Location: [facility] ENDOSCOPY;  Service: Gastroenterology;  Laterality: N/A;   ESOPHAGOGASTRODUODENOSCOPY (EGD) WITH PROPOFOL  N/A 05/05/2022   Procedure: ESOPHAGOGASTRODUODENOSCOPY (EGD) WITH PROPOFOL ;  Surgeon: Shane Darling, MD;  Location: [facility] ENDOSCOPY;  Service: Endoscopy;  Laterality: N/A;   FLEXIBLE SIGMOIDOSCOPY N/A 02/06/2021   Procedure: FLEXIBLE SIGMOIDOSCOPY;  Surgeon: Quintin Buckle, DO;  Location: [facility] ENDOSCOPY;  Service: Gastroenterology;  Laterality: N/A;   GIVENS CAPSULE STUDY N/A 06/07/2021   Procedure: GIVENS CAPSULE STUDY;  Surgeon: Selena Daily, MD;  Location: [facility] ENDOSCOPY;  Service: Gastroenterology;  Laterality: N/A;   GIVENS CAPSULE STUDY N/A 06/09/2021   Procedure: GIVENS CAPSULE STUDY;  Surgeon: Selena Daily, MD;  Location: [facility] ENDOSCOPY;  Service: Gastroenterology;  Laterality: N/A;   HEMORRHOID SURGERY     LAPAROSCOPIC GASTRIC RESTRICTIVE DUODENAL PROCEDURE (DUODENAL SWITCH) Bilateral    2020    SOCIAL HISTORY: Social History   Socioeconomic History   Marital status: Married    Spouse name: Not on file   Number of children: Not on file   Years of education: Not on file   Highest education level: 12th grade  Occupational History   Not on file  Tobacco Use   Smoking  status: Former    Current packs/day: 0.00    Average packs/day: 1.5 packs/day for 24.0 years (36.0 ttl pk-yrs)    Types: Cigarettes    Start date: 57    Quit date: 71    Years since quitting: 32.3   Smokeless tobacco: Never   Tobacco comments:    quit 1995.   Vaping Use   Vaping status: Never Used  Substance and Sexual Activity   Alcohol  use: Not Currently    Alcohol /week: 14.0 standard drinks of alcohol     Types: 14 Glasses of wine per week   Drug use: No   Sexual  activity: Not Currently    Birth control/protection: Surgical    Comment: Hysterectomy  Other Topics Concern   Not on file  Social History Narrative   Lives in [place].    Married.    Retired 2015, Engineer, structural.    One son; granddaughter.    Left handed    Caffeine- decaf coffee.    Social Drivers of Corporate investment banker Strain: Low Risk  (04/17/2023)   Overall Financial Resource Strain (CARDIA)    Difficulty of Paying Living Expenses: Not hard at all  Food Insecurity: No Food Insecurity (04/17/2023)   Hunger Vital Sign    Worried About Running Out of Food in the Last Year: Never true    Ran Out of Food in the Last Year: Never true  Transportation Needs: No Transportation Needs (04/17/2023)   PRAPARE - Administrator, Civil Service (Medical): No    Lack of Transportation (Non-Medical): No  Physical Activity: Insufficiently Active (04/17/2023)   Exercise Vital Sign    Days of Exercise per Week: 3 days    Minutes of Exercise per Session: 20 min  Stress: No Stress Concern Present (04/17/2023)   [employer] of Occupational Health - Occupational Stress Questionnaire    Feeling of Stress : Only a little  Social Connections: Socially Isolated (04/17/2023)   Social Connection and Isolation Panel [NHANES]    Frequency of Communication with Friends and Family: Once a week    Frequency of Social Gatherings with Friends and Family: Once a week    Attends Religious Services: Never    Database administrator or Organizations: No    Attends Banker Meetings: Never    Marital Status: Married  Catering manager Violence: Not At Risk (04/17/2023)   Humiliation, Afraid, Rape, and Kick questionnaire    Fear of Current or Ex-Partner: No    Emotionally Abused: No    Physically Abused: No    Sexually Abused: No    FAMILY HISTORY: Family History  Problem Relation Age of Onset   Hypertension Mother    Heart disease Father    Alcohol  abuse Father     Breast cancer Sister 56       materal 1/2 sister   Hypertension Sister    Hypertension Brother     ALLERGIES:  is allergic to bee pollen, celecoxib, hydrocodone , pollen extract, sodium ferric gluconate [ferrous gluconate], nasacort  [triamcinolone ], and vicodin [hydrocodone -acetaminophen ].  MEDICATIONS:  Current Outpatient Medications  Medication Sig Dispense Refill   acetaminophen  (TYLENOL ) 325 MG tablet Take 2 tablets (650 mg total) by mouth every 6 (six) hours as needed for mild pain (or Fever >/= 101).     acyclovir  (ZOVIRAX ) 400 MG tablet Take 1 tablet (400 mg total) by mouth 2 (two) times daily. 180 tablet 3   famotidine  (PEPCID ) 20 MG tablet Take 1  tablet (20 mg total) by mouth at bedtime as needed for heartburn or indigestion. 90 tablet 1   FLUoxetine (PROZAC) 20 MG capsule Take 40 mg by mouth daily.     fluticasone  (FLONASE ) 50 MCG/ACT nasal spray INSTILL 1 SPRAY INTO BOTH NOSTRILS DAILY 48 mL 0   gabapentin  (NEURONTIN ) 100 MG capsule Take 100mg  PO qam and 100mg  evening. 180 capsule 3   gabapentin  (NEURONTIN ) 300 MG capsule Take 900mg  qam, 300mg  at noon, and 900mg  at bedtime. 150 capsule 5   hydrocortisone  (ANUSOL -HC) 25 MG suppository Unwrap and insert 1 suppository rectally twice a day 12 suppository 1   levothyroxine  (SYNTHROID ) 50 MCG tablet Take 1 tablet (50 mcg total) by mouth daily. 90 tablet 3   losartan  (COZAAR ) 100 MG tablet Take 1 tablet (100 mg total) by mouth daily. TAKE 1 TABLET BY MOUTH EVERYDAY AT BEDTIME 30 tablet 1   Multiple Vitamin (MULTIVITAMIN WITH MINERALS) TABS tablet Take 1 tablet by mouth daily.     naltrexone (DEPADE) 50 MG tablet Take by mouth daily. Patient takes  one  50 mg tablet daily at the same time     potassium chloride  SA (KLOR-CON  M) 20 MEQ tablet Take 1 tablet (20 mEq total) by mouth daily.     rosuvastatin  (CRESTOR ) 5 MG tablet Take 2.5 mg by mouth daily.     torsemide  (DEMADEX ) 20 MG tablet Take 1 tablet (20 mg total) by mouth daily.      traZODone  (DESYREL ) 50 MG tablet TAKE 1 TABLET BY MOUTH EVERY DAY FOR SLEEP 90 tablet 4   triamterene -hydrochlorothiazide  (DYAZIDE ) 37.5-25 MG capsule TAKE 1 EACH (1 CAPSULE TOTAL) BY MOUTH DAILY. 90 capsule 3   sodium chloride  (OCEAN) 0.65 % SOLN nasal spray Place 2 sprays into both nostrils as needed for congestion. (Patient not taking: Reported on 05/11/2023) 30 mL 2   No current facility-administered medications for this visit.     PHYSICAL EXAMINATION: ECOG PERFORMANCE STATUS: 1 - Symptomatic but completely ambulatory Vitals:   05/11/23 1338  BP: 129/64  Pulse: (!) 53  Resp: 18  Temp: 99.3 F (37.4 C)   Filed Weights   05/11/23 1338  Weight: 177 lb (80.3 kg)    Physical Exam Constitutional:      General: She is not in acute distress. HENT:     Head: Normocephalic and atraumatic.  Eyes:     General: No scleral icterus. Cardiovascular:     Rate and Rhythm: Normal rate.  Pulmonary:     Effort: Pulmonary effort is normal. No respiratory distress.  Abdominal:     General: There is no distension.  Musculoskeletal:        General: Normal range of motion.     Cervical back: Normal range of motion and neck supple.  Skin:    Findings: No erythema or rash.  Neurological:     Mental Status: She is alert and oriented to person, place, and time. Mental status is at baseline.  Psychiatric:        Mood and Affect: Mood normal.     LABORATORY DATA:  I have reviewed the data as listed    Latest Ref Rng & Units 05/05/2023   10:29 AM 12/19/2022   11:23 AM 10/27/2022    8:25 AM  CBC  WBC 4.0 - 10.5 K/uL 5.5  5.5  5.3   Hemoglobin 12.0 - 15.0 g/dL 8.6  16.1  09.6   Hematocrit 36.0 - 46.0 % 28.0  36.4  35.6   Platelets  150 - 400 K/uL 141  161  140       Latest Ref Rng & Units 12/19/2022   11:23 AM 11/25/2022    9:45 AM 09/07/2022    9:14 AM  CMP  Glucose 70 - 99 mg/dL 284  86  132   BUN 8 - 23 mg/dL 20  19  18    Creatinine 0.44 - 1.00 mg/dL 4.40  1.02  7.25   Sodium 135  - 145 mmol/L 138  134  133   Potassium 3.5 - 5.1 mmol/L 4.2  3.7  4.0   Chloride 98 - 111 mmol/L 98  97  99   CO2 22 - 32 mmol/L 30  31  26    Calcium  8.9 - 10.3 mg/dL 9.0  9.2  8.8   Total Protein 6.5 - 8.1 g/dL 7.2   6.9   Total Bilirubin <1.2 mg/dL 0.5   0.4   Alkaline Phos 38 - 126 U/L 105   102   AST 15 - 41 U/L 60   42   ALT 0 - 44 U/L 40   19     Lab Results  Component Value Date   IRON  19 (L) 05/05/2023   TIBC 461 (H) 05/05/2023   FERRITIN 8 (L) 05/05/2023     RADIOGRAPHIC STUDIES: I have personally reviewed the radiological images as listed and agreed with the findings in the report. No results found.

## 2023-05-11 NOTE — Assessment & Plan Note (Signed)
#  Iron  deficiency anemia due to chronic blood loss. Labs reviewed and discussed with patient. Lab Results  Component Value Date   HGB 8.6 (L) 05/05/2023   TIBC 461 (H) 05/05/2023   IRONPCTSAT 4 (L) 05/05/2023   FERRITIN 8 (L) 05/05/2023   Recommend Venofer  weekly x 4

## 2023-05-12 DIAGNOSIS — M48062 Spinal stenosis, lumbar region with neurogenic claudication: Secondary | ICD-10-CM | POA: Diagnosis not present

## 2023-05-12 DIAGNOSIS — M5416 Radiculopathy, lumbar region: Secondary | ICD-10-CM | POA: Diagnosis not present

## 2023-05-12 DIAGNOSIS — M4807 Spinal stenosis, lumbosacral region: Secondary | ICD-10-CM | POA: Diagnosis not present

## 2023-05-15 ENCOUNTER — Telehealth: Payer: Self-pay

## 2023-05-15 DIAGNOSIS — D500 Iron deficiency anemia secondary to blood loss (chronic): Secondary | ICD-10-CM

## 2023-05-15 NOTE — Telephone Encounter (Signed)
 Referral faxed to [facility]. Fax confirmation received.   Ph: [phone] Fax: [phone]

## 2023-05-15 NOTE — Telephone Encounter (Signed)
-----   Message from Timmy Forbes sent at 05/11/2023  4:48 PM EDT ----- Please send a referral to Dr. Emerick Hanlon GI - IDA evaluation. He did her EGD and colonoscopy in the past. Patient is aware about that. Thanks.  zy

## 2023-05-17 ENCOUNTER — Other Ambulatory Visit: Payer: Self-pay | Admitting: Family

## 2023-05-17 ENCOUNTER — Telehealth: Payer: Self-pay

## 2023-05-17 DIAGNOSIS — G8929 Other chronic pain: Secondary | ICD-10-CM

## 2023-05-17 MED ORDER — GABAPENTIN 300 MG PO CAPS: ORAL_CAPSULE | ORAL | 5 refills | Status: DC

## 2023-05-17 NOTE — Telephone Encounter (Signed)
 Copied from CRM [id number]. Topic: Clinical - Medication Question >> May 17, 2023 10:17 AM Jennifer [name] wrote: Reason for CRM: pt called to speak with her provider regarding Folic Acid .. pt wants to know if she should be taking it or not.,

## 2023-05-17 NOTE — Telephone Encounter (Signed)
 Copied from CRM [id number]. Topic: Clinical - Medication Refill >> May 17, 2023 10:19 AM Winnifred Havers wrote: Most Recent Primary Care Visit:  Provider: Felicitas Horse C  Department: [facility]  Visit Type: ANNUAL WELL VISIT, SEQUENTIAL  Date: 04/17/2023  Medication: gabapentin  (NEURONTIN ) 300 MG capsule  Has the patient contacted their pharmacy? Yes (Agent: If no, request that the patient contact the pharmacy for the refill. If patient does not wish to contact the pharmacy document the reason why and proceed with request.) (Agent: If yes, when and what did the pharmacy advise?)  Is this the correct pharmacy for this prescription? Yes If no, delete pharmacy and type the correct one.  This is the patient's preferred pharmacy:  [facility] [address], [place] - [address] [place] [address] Phone: [phone] Fax: [phone]   Has the prescription been filled recently? Yes  Is the patient out of the medication? Yes  Has the patient been seen for an appointment in the last year OR does the patient have an upcoming appointment? Yes  Can we respond through MyChart? Yes  Agent: Please be advised that Rx refills may take up to 3 business days. We ask that you follow-up with your pharmacy.

## 2023-05-18 ENCOUNTER — Inpatient Hospital Stay: Attending: Oncology

## 2023-05-18 VITALS — BP 112/53 | HR 59 | Temp 97.0°F | Resp 16

## 2023-05-18 DIAGNOSIS — D62 Acute posthemorrhagic anemia: Secondary | ICD-10-CM

## 2023-05-18 DIAGNOSIS — D500 Iron deficiency anemia secondary to blood loss (chronic): Secondary | ICD-10-CM

## 2023-05-18 DIAGNOSIS — D509 Iron deficiency anemia, unspecified: Secondary | ICD-10-CM | POA: Diagnosis not present

## 2023-05-18 MED ORDER — IRON SUCROSE 20 MG/ML IV SOLN
200.0000 mg | Freq: Once | INTRAVENOUS | Status: AC
  Administered 2023-05-18: 200 mg via INTRAVENOUS

## 2023-05-18 NOTE — Telephone Encounter (Signed)
 Call pt Continue folic acid  1mg  every day  otc Add to chart

## 2023-05-18 NOTE — Telephone Encounter (Signed)
 LVM to call back to inform pt of below. Please relay message and document   Call pt Continue folic acid  1mg  every day  otc Add to chart

## 2023-05-18 NOTE — Patient Instructions (Signed)
 Iron Sucrose Injection What is this medication? IRON SUCROSE (EYE ern SOO krose) treats low levels of iron (iron deficiency anemia) in people with kidney disease. Iron is a mineral that plays an important role in making red blood cells, which carry oxygen from your lungs to the rest of your body. This medicine may be used for other purposes; ask your health care provider or pharmacist if you have questions. COMMON BRAND NAME(S): Venofer What should I tell my care team before I take this medication? They need to know if you have any of these conditions: Anemia not caused by low iron levels Heart disease High levels of iron in the blood Kidney disease Liver disease An unusual or allergic reaction to iron, other medications, foods, dyes, or preservatives Pregnant or trying to get pregnant Breastfeeding How should I use this medication? This medication is for infusion into a vein. It is given in a hospital or clinic setting. Talk to your care team about the use of this medication in children. While this medication may be prescribed for children as young as 2 years for selected conditions, precautions do apply. Overdosage: If you think you have taken too much of this medicine contact a poison control center or emergency room at once. NOTE: This medicine is only for you. Do not share this medicine with others. What if I miss a dose? Keep appointments for follow-up doses. It is important not to miss your dose. Call your care team if you are unable to keep an appointment. What may interact with this medication? Do not take this medication with any of the following: Deferoxamine Dimercaprol Other iron products This medication may also interact with the following: Chloramphenicol Deferasirox This list may not describe all possible interactions. Give your health care provider a list of all the medicines, herbs, non-prescription drugs, or dietary supplements you use. Also tell them if you smoke,  drink alcohol, or use illegal drugs. Some items may interact with your medicine. What should I watch for while using this medication? Visit your care team regularly. Tell your care team if your symptoms do not start to get better or if they get worse. You may need blood work done while you are taking this medication. You may need to follow a special diet. Talk to your care team. Foods that contain iron include: whole grains/cereals, dried fruits, beans, or peas, leafy green vegetables, and organ meats (liver, kidney). What side effects may I notice from receiving this medication? Side effects that you should report to your care team as soon as possible: Allergic reactions--skin rash, itching, hives, swelling of the face, lips, tongue, or throat Low blood pressure--dizziness, feeling faint or lightheaded, blurry vision Shortness of breath Side effects that usually do not require medical attention (report to your care team if they continue or are bothersome): Flushing Headache Joint pain Muscle pain Nausea Pain, redness, or irritation at injection site This list may not describe all possible side effects. Call your doctor for medical advice about side effects. You may report side effects to FDA at 1-800-FDA-1088. Where should I keep my medication? This medication is given in a hospital or clinic. It will not be stored at home. NOTE: This sheet is a summary. It may not cover all possible information. If you have questions about this medicine, talk to your doctor, pharmacist, or health care provider.  2024 Elsevier/Gold Standard (2022-06-10 00:00:00)

## 2023-05-19 NOTE — Telephone Encounter (Signed)
 LVM to call back to inform pt of below. Please relay message and document    Call pt Continue folic acid  1mg  every day  otc Add to chart

## 2023-05-22 NOTE — Telephone Encounter (Signed)
 Pt has been notified and states that she needs refill because she is out of the Folic acid , and also wanted to inform you that they are doing infusions because of internal bleeding per pt has appt on 05/29/23

## 2023-05-23 ENCOUNTER — Other Ambulatory Visit: Payer: Self-pay | Admitting: Family

## 2023-05-23 DIAGNOSIS — K219 Gastro-esophageal reflux disease without esophagitis: Secondary | ICD-10-CM

## 2023-05-23 DIAGNOSIS — I10 Essential (primary) hypertension: Secondary | ICD-10-CM | POA: Diagnosis not present

## 2023-05-24 MED ORDER — FOLIC ACID 1 MG PO TABS
1.0000 mg | ORAL_TABLET | Freq: Every day | ORAL | 3 refills | Status: AC
Start: 2023-05-24 — End: ?

## 2023-05-24 NOTE — Addendum Note (Signed)
 Addended by: Calista Catching on: 05/24/2023 06:22 AM   Modules accepted: Orders

## 2023-05-24 NOTE — Telephone Encounter (Signed)
 Noted! Pt has been notified that rx sent in to the pharmacy

## 2023-05-24 NOTE — Telephone Encounter (Signed)
 I have refilled folic acid 

## 2023-05-25 ENCOUNTER — Inpatient Hospital Stay

## 2023-05-25 VITALS — BP 134/60 | HR 53 | Temp 97.4°F | Resp 16

## 2023-05-25 DIAGNOSIS — D62 Acute posthemorrhagic anemia: Secondary | ICD-10-CM

## 2023-05-25 DIAGNOSIS — D509 Iron deficiency anemia, unspecified: Secondary | ICD-10-CM | POA: Diagnosis not present

## 2023-05-25 DIAGNOSIS — D500 Iron deficiency anemia secondary to blood loss (chronic): Secondary | ICD-10-CM

## 2023-05-25 MED ORDER — IRON SUCROSE 20 MG/ML IV SOLN
200.0000 mg | Freq: Once | INTRAVENOUS | Status: AC
  Administered 2023-05-25: 200 mg via INTRAVENOUS
  Filled 2023-05-25: qty 10

## 2023-05-25 NOTE — Patient Instructions (Signed)

## 2023-05-29 DIAGNOSIS — D500 Iron deficiency anemia secondary to blood loss (chronic): Secondary | ICD-10-CM | POA: Diagnosis not present

## 2023-05-29 DIAGNOSIS — Z860100 Personal history of colon polyps, unspecified: Secondary | ICD-10-CM | POA: Diagnosis not present

## 2023-05-31 DIAGNOSIS — R001 Bradycardia, unspecified: Secondary | ICD-10-CM | POA: Diagnosis not present

## 2023-06-01 ENCOUNTER — Inpatient Hospital Stay

## 2023-06-01 DIAGNOSIS — J301 Allergic rhinitis due to pollen: Secondary | ICD-10-CM | POA: Diagnosis not present

## 2023-06-01 DIAGNOSIS — R0982 Postnasal drip: Secondary | ICD-10-CM | POA: Diagnosis not present

## 2023-06-01 DIAGNOSIS — K219 Gastro-esophageal reflux disease without esophagitis: Secondary | ICD-10-CM | POA: Diagnosis not present

## 2023-06-05 ENCOUNTER — Encounter
Admission: RE | Disposition: A | Discharge status: Home / Self Care | Payer: Self-pay | Source: Home / Self Care | Attending: Gastroenterology

## 2023-06-05 ENCOUNTER — Ambulatory Visit
Admission: RE | Admit: 2023-06-05 | Discharge: 2023-06-05 | Disposition: A | Discharge status: Home / Self Care | Attending: Gastroenterology | Admitting: Gastroenterology

## 2023-06-05 ENCOUNTER — Ambulatory Visit: Admitting: Anesthesiology

## 2023-06-05 ENCOUNTER — Encounter: Payer: Self-pay | Admitting: *Deleted

## 2023-06-05 ENCOUNTER — Other Ambulatory Visit: Payer: Self-pay

## 2023-06-05 DIAGNOSIS — Q438 Other specified congenital malformations of intestine: Secondary | ICD-10-CM | POA: Insufficient documentation

## 2023-06-05 DIAGNOSIS — K635 Polyp of colon: Secondary | ICD-10-CM | POA: Insufficient documentation

## 2023-06-05 DIAGNOSIS — G4733 Obstructive sleep apnea (adult) (pediatric): Secondary | ICD-10-CM | POA: Diagnosis not present

## 2023-06-05 DIAGNOSIS — D509 Iron deficiency anemia, unspecified: Secondary | ICD-10-CM | POA: Insufficient documentation

## 2023-06-05 DIAGNOSIS — F32A Depression, unspecified: Secondary | ICD-10-CM | POA: Diagnosis not present

## 2023-06-05 DIAGNOSIS — D120 Benign neoplasm of cecum: Secondary | ICD-10-CM | POA: Insufficient documentation

## 2023-06-05 DIAGNOSIS — K219 Gastro-esophageal reflux disease without esophagitis: Secondary | ICD-10-CM | POA: Diagnosis not present

## 2023-06-05 DIAGNOSIS — E785 Hyperlipidemia, unspecified: Secondary | ICD-10-CM | POA: Insufficient documentation

## 2023-06-05 DIAGNOSIS — Z87891 Personal history of nicotine dependence: Secondary | ICD-10-CM | POA: Insufficient documentation

## 2023-06-05 DIAGNOSIS — Z9884 Bariatric surgery status: Secondary | ICD-10-CM | POA: Diagnosis not present

## 2023-06-05 DIAGNOSIS — D125 Benign neoplasm of sigmoid colon: Secondary | ICD-10-CM | POA: Insufficient documentation

## 2023-06-05 DIAGNOSIS — F419 Anxiety disorder, unspecified: Secondary | ICD-10-CM | POA: Insufficient documentation

## 2023-06-05 DIAGNOSIS — Z79899 Other long term (current) drug therapy: Secondary | ICD-10-CM | POA: Insufficient documentation

## 2023-06-05 DIAGNOSIS — K3189 Other diseases of stomach and duodenum: Secondary | ICD-10-CM | POA: Insufficient documentation

## 2023-06-05 DIAGNOSIS — K649 Unspecified hemorrhoids: Secondary | ICD-10-CM | POA: Diagnosis not present

## 2023-06-05 DIAGNOSIS — K289 Gastrojejunal ulcer, unspecified as acute or chronic, without hemorrhage or perforation: Secondary | ICD-10-CM | POA: Diagnosis not present

## 2023-06-05 DIAGNOSIS — K449 Diaphragmatic hernia without obstruction or gangrene: Secondary | ICD-10-CM | POA: Insufficient documentation

## 2023-06-05 DIAGNOSIS — K640 First degree hemorrhoids: Secondary | ICD-10-CM | POA: Insufficient documentation

## 2023-06-05 DIAGNOSIS — I10 Essential (primary) hypertension: Secondary | ICD-10-CM | POA: Insufficient documentation

## 2023-06-05 DIAGNOSIS — E039 Hypothyroidism, unspecified: Secondary | ICD-10-CM | POA: Diagnosis not present

## 2023-06-05 HISTORY — PX: POLYPECTOMY: SHX149

## 2023-06-05 HISTORY — PX: ESOPHAGOGASTRODUODENOSCOPY: SHX5428

## 2023-06-05 HISTORY — PX: COLONOSCOPY: SHX5424

## 2023-06-05 SURGERY — EGD (ESOPHAGOGASTRODUODENOSCOPY)
Anesthesia: General

## 2023-06-05 MED ORDER — LIDOCAINE HCL (PF) 2 % IJ SOLN
INTRAMUSCULAR | Status: AC
  Filled 2023-06-05: qty 5

## 2023-06-05 MED ORDER — DEXMEDETOMIDINE HCL IN NACL 80 MCG/20ML IV SOLN
INTRAVENOUS | Status: DC | PRN
  Administered 2023-06-05 (×2): 4 ug via INTRAVENOUS

## 2023-06-05 MED ORDER — LIDOCAINE HCL (CARDIAC) PF 100 MG/5ML IV SOSY
PREFILLED_SYRINGE | INTRAVENOUS | Status: DC | PRN
  Administered 2023-06-05: 100 mg via INTRAVENOUS

## 2023-06-05 MED ORDER — GLYCOPYRROLATE 0.2 MG/ML IJ SOLN
INTRAMUSCULAR | Status: AC
  Filled 2023-06-05: qty 1

## 2023-06-05 MED ORDER — SODIUM CHLORIDE 0.9 % IV SOLN: INTRAVENOUS | Status: DC

## 2023-06-05 MED ORDER — PROPOFOL 10 MG/ML IV BOLUS
INTRAVENOUS | Status: AC
Start: 2023-06-05 — End: ?
  Filled 2023-06-05: qty 40

## 2023-06-05 MED ORDER — PROPOFOL 10 MG/ML IV BOLUS
INTRAVENOUS | Status: DC | PRN
  Administered 2023-06-05 (×2): 50 mg via INTRAVENOUS
  Administered 2023-06-05: 20 mg via INTRAVENOUS
  Administered 2023-06-05 (×2): 50 mg via INTRAVENOUS
  Administered 2023-06-05: 30 mg via INTRAVENOUS
  Administered 2023-06-05: 100 mg via INTRAVENOUS
  Administered 2023-06-05: 50 mg via INTRAVENOUS

## 2023-06-05 NOTE — Transfer of Care (Signed)
 Immediate Anesthesia Transfer of Care Note  Patient: Jennifer [name]  Procedure(s) Performed: EGD (ESOPHAGOGASTRODUODENOSCOPY) COLONOSCOPY POLYPECTOMY, INTESTINE  Patient Location: PACU and Endoscopy Unit  Anesthesia Type:General  Level of Consciousness: awake, alert , oriented, and pateint uncooperative  Airway & Oxygen Therapy: Patient Spontanous Breathing  Post-op Assessment: Report given to RN and Post -op Vital signs reviewed and stable  Post vital signs: Reviewed and stable  Last Vitals:  Vitals Value Taken Time  BP 125/66 06/05/23 1138  Temp 36.1 C 06/05/23 1137  Pulse 54 06/05/23 1137  Resp 13 06/05/23 1137  SpO2 100 % 06/05/23 1137  Vitals shown include unfiled device data.  Last Pain:  Vitals:   06/05/23 1137  TempSrc: Temporal  PainSc: 0-No pain         Complications: No notable events documented.

## 2023-06-05 NOTE — H&P (Signed)
 Outpatient short stay form Pre-procedure 06/05/2023  Shane Darling, MD  Primary Physician: Calista Catching, FNP  Reason for visit:  IDA  History of present illness:    73 y/o lady with history of duodenal switch, constipation, HLD, and OSA here for EGD/Colonoscopy for IDA. No blood thinners. No family history of GI malignancies.    Current Facility-Administered Medications:    0.9 %  sodium chloride  infusion, , Intravenous, Continuous, Yusef Lamp, Leanora Prophet, MD, Last Rate: 20 mL/hr at 06/05/23 0959, New Bag at 06/05/23 0959  Medications Prior to Admission  Medication Sig Dispense Refill Last Dose/Taking   acetaminophen  (TYLENOL ) 325 MG tablet Take 2 tablets (650 mg total) by mouth every 6 (six) hours as needed for mild pain (or Fever >/= 101).   06/04/2023   acyclovir  (ZOVIRAX ) 400 MG tablet Take 1 tablet (400 mg total) by mouth 2 (two) times daily. 180 tablet 3 06/04/2023   famotidine  (PEPCID ) 20 MG tablet TAKE 1 TABLET (20 MG TOTAL) BY MOUTH AT BEDTIME AS NEEDED FOR HEARTBURN OR INDIGESTION. 30 tablet 5 Past Week   FLUoxetine (PROZAC) 20 MG capsule Take 40 mg by mouth daily.   06/04/2023   fluticasone  (FLONASE ) 50 MCG/ACT nasal spray INSTILL 1 SPRAY INTO BOTH NOSTRILS DAILY 48 mL 0 Past Week   folic acid  (FOLVITE ) 1 MG tablet Take 1 tablet (1 mg total) by mouth daily. 90 tablet 3 06/04/2023   gabapentin  (NEURONTIN ) 100 MG capsule Take 100mg  PO qam and 100mg  evening. 180 capsule 3 06/04/2023   gabapentin  (NEURONTIN ) 300 MG capsule Take 900mg  qam, 300mg  at noon, and 900mg  at bedtime. 150 capsule 5 06/04/2023   levothyroxine  (SYNTHROID ) 50 MCG tablet Take 1 tablet (50 mcg total) by mouth daily. 90 tablet 3 06/05/2023 Morning   losartan  (COZAAR ) 100 MG tablet Take 1 tablet (100 mg total) by mouth daily. TAKE 1 TABLET BY MOUTH EVERYDAY AT BEDTIME 30 tablet 1 06/05/2023 Morning   Multiple Vitamin (MULTIVITAMIN WITH MINERALS) TABS tablet Take 1 tablet by mouth daily.   06/04/2023   naltrexone  (DEPADE) 50 MG tablet Take by mouth daily. Patient takes  one  50 mg tablet daily at the same time   06/04/2023   potassium chloride  SA (KLOR-CON  M) 20 MEQ tablet Take 1 tablet (20 mEq total) by mouth daily.   06/04/2023   rosuvastatin  (CRESTOR ) 5 MG tablet Take 2.5 mg by mouth daily.   06/04/2023   torsemide  (DEMADEX ) 20 MG tablet Take 1 tablet (20 mg total) by mouth daily.   06/04/2023   traZODone  (DESYREL ) 50 MG tablet TAKE 1 TABLET BY MOUTH EVERY DAY FOR SLEEP 90 tablet 4 06/04/2023   hydrocortisone  (ANUSOL -HC) 25 MG suppository Unwrap and insert 1 suppository rectally twice a day 12 suppository 1    sodium chloride  (OCEAN) 0.65 % SOLN nasal spray Place 2 sprays into both nostrils as needed for congestion. (Patient not taking: Reported on 05/11/2023) 30 mL 2    triamterene -hydrochlorothiazide  (DYAZIDE ) 37.5-25 MG capsule TAKE 1 EACH (1 CAPSULE TOTAL) BY MOUTH DAILY. 90 capsule 3      Allergies  Allergen Reactions   Bee Pollen Itching   Celecoxib Hives   Hydrocodone  Nausea Only   Pollen Extract Itching   Sodium Ferric Gluconate [Ferrous Gluconate]     IV ferric gluconate - shortly after the infusion developed back pain shooting into left arm and bilateral hand swelling   Nasacort  [Triamcinolone ] Other (See Comments)    Nasal - Nose Bleeds   Vicodin [Hydrocodone -Acetaminophen ]  Nausea Only     Past Medical History:  Diagnosis Date   Anemia    Anxiety    Bariatric surgery status    Complication of anesthesia    Diificulty breathing for about 15 minutes after bariatric surgery   Constipation    COVID-19    Dysrhythmia    Elevated liver enzymes    GERD (gastroesophageal reflux disease)    Hemorrhoids    Herpes genitalis    High cholesterol    Hyperlipidemia    Hypertension    Hypothyroidism    IDA (iron  deficiency anemia) 02/09/2021   Neuropathy    Osteoarthritis    Sleep apnea    CPAP    Review of systems:  Otherwise negative.    Physical Exam  Gen: Alert, oriented.  Appears stated age.  HEENT: PERRLA. Lungs: No respiratory distress CV: RRR Abd: soft, benign, no masses Ext: No edema    Planned procedures: Proceed with EGD/colonoscopy. The patient understands the nature of the planned procedure, indications, risks, alternatives and potential complications including but not limited to bleeding, infection, perforation, damage to internal organs and possible oversedation/side effects from anesthesia. The patient agrees and gives consent to proceed.  Please refer to procedure notes for findings, recommendations and patient disposition/instructions.     Shane Darling, MD [facility]

## 2023-06-05 NOTE — Interval H&P Note (Signed)
 History and Physical Interval Note:  06/05/2023 10:34 AM  Jennifer [name]  has presented today for surgery, with the diagnosis of IDA.  The various methods of treatment have been discussed with the patient and family. After consideration of risks, benefits and other options for treatment, the patient has consented to  Procedure(s): EGD (ESOPHAGOGASTRODUODENOSCOPY) (N/A) COLONOSCOPY (N/A) as a surgical intervention.  The patient's history has been reviewed, patient examined, no change in status, stable for surgery.  I have reviewed the patient's chart and labs.  Questions were answered to the patient's satisfaction.     Shane Darling  Ok to proceed with EGD/Colonoscopy

## 2023-06-05 NOTE — Brief Op Note (Signed)
Ascending colon polyp not retrieved.

## 2023-06-05 NOTE — Op Note (Signed)
 [facility] Gastroenterology Patient Name: Jennifer [name] Procedure Date: 06/05/2023 10:32 AM MRN: [id number] Account #: [id number] Date of Birth: 07-27-1950 Admit Type: Outpatient Age: 73 Room: [facility] ENDO ROOM 3 Gender: Female Note Status: Finalized Instrument Name: Cristino Donna Endoscope [id number] Procedure:             Upper GI endoscopy Indications:           Iron  deficiency anemia Providers:             Leida Puna MD, MD Medicines:             Monitored Anesthesia Care Complications:         No immediate complications. Estimated blood loss:                         Minimal. Procedure:             Pre-Anesthesia Assessment:                        - Prior to the procedure, a History and Physical was                         performed, and patient medications and allergies were                         reviewed. The patient is competent. The risks and                         benefits of the procedure and the sedation options and                         risks were discussed with the patient. All questions                         were answered and informed consent was obtained.                         Patient identification and proposed procedure were                         verified by the physician, the nurse, the                         anesthesiologist, the anesthetist and the technician                         in the endoscopy suite. Mental Status Examination:                         alert and oriented. Airway Examination: normal                         oropharyngeal airway and neck mobility. Respiratory                         Examination: clear to auscultation. CV Examination:                         normal. Prophylactic Antibiotics: The patient does not  require prophylactic antibiotics. Prior                         Anticoagulants: The patient has taken no anticoagulant                         or antiplatelet agents. ASA Grade  Assessment: III - A                         patient with severe systemic disease. After reviewing                         the risks and benefits, the patient was deemed in                         satisfactory condition to undergo the procedure. The                         anesthesia plan was to use monitored anesthesia care                         (MAC). Immediately prior to administration of                         medications, the patient was re-assessed for adequacy                         to receive sedatives. The heart rate, respiratory                         rate, oxygen saturations, blood pressure, adequacy of                         pulmonary ventilation, and response to care were                         monitored throughout the procedure. The physical                         status of the patient was re-assessed after the                         procedure.                        After obtaining informed consent, the endoscope was                         passed under direct vision. Throughout the procedure,                         the patient's blood pressure, pulse, and oxygen                         saturations were monitored continuously. The Endoscope                         was introduced through the mouth, and advanced to the  operative stoma of duodenum. The upper GI endoscopy                         was accomplished without difficulty. The patient                         tolerated the procedure well. Findings:      The examined esophagus was normal.      A 4 cm hiatal hernia was present.      Evidence of a sleeve gastrectomy was found in the stomach.      Biopsies were taken with a cold forceps in the stomach for Helicobacter       pylori testing. Estimated blood loss was minimal.      There was evidence of a duodenal switch in the duodenal bulb. This was       characterized by erosion. Impression:            - Normal esophagus.                         - 4 cm hiatal hernia.                        - A sleeve gastrectomy was found.                        - Duodenal switch, characterized by erosion was found.                        - Biopsies were taken with a cold forceps for                         Helicobacter pylori testing. Recommendation:        - Discharge patient to home.                        - Resume previous diet.                        - Continue present medications.                        - Await pathology results.                        - Return to referring physician as previously                         scheduled. Procedure Code(s):     --- Professional ---                        [id number], Esophagogastroduodenoscopy, flexible,                         transoral; with biopsy, single or multiple Diagnosis Code(s):     --- Professional ---                        K44.9, Diaphragmatic hernia without obstruction or  gangrene                        Z98.84, Bariatric surgery status                        D50.9, Iron  deficiency anemia, unspecified CPT copyright 2022 American Medical Association. All rights reserved. The codes documented in this report are preliminary and upon coder review may  be revised to meet current compliance requirements. Leida Puna MD, MD 06/05/2023 11:35:39 AM Number of Addenda: 0 Note Initiated On: 06/05/2023 10:32 AM Estimated Blood Loss:  Estimated blood loss was minimal.      [facility]

## 2023-06-05 NOTE — Op Note (Addendum)
 [facility] Gastroenterology Patient Name: Jennifer [name] Procedure Date: 06/05/2023 10:26 AM MRN: [id number] Account #: [id number] Date of Birth: 11-15-1950 Admit Type: Outpatient Age: 73 Room: [facility] ENDO ROOM 3 Gender: Female Note Status: Supervisor Override Instrument Name: Hyman Main [id number] Procedure:             Colonoscopy Indications:           High risk colon cancer surveillance: Personal history                         of colonic polyps, Iron  deficiency anemia Providers:             Leida Puna MD, MD Medicines:             Monitored Anesthesia Care Complications:         No immediate complications. Estimated blood loss:                         Minimal. Procedure:             Pre-Anesthesia Assessment:                        - Prior to the procedure, a History and Physical was                         performed, and patient medications and allergies were                         reviewed. The patient is competent. The risks and                         benefits of the procedure and the sedation options and                         risks were discussed with the patient. All questions                         were answered and informed consent was obtained.                         Patient identification and proposed procedure were                         verified by the physician, the nurse, the                         anesthesiologist, the anesthetist and the technician                         in the endoscopy suite. Mental Status Examination:                         alert and oriented. Airway Examination: normal                         oropharyngeal airway and neck mobility. Respiratory                         Examination: clear to auscultation. CV Examination:  normal. Prophylactic Antibiotics: The patient does not                         require prophylactic antibiotics. Prior                         Anticoagulants: The patient  has taken no anticoagulant                         or antiplatelet agents. ASA Grade Assessment: III - A                         patient with severe systemic disease. After reviewing                         the risks and benefits, the patient was deemed in                         satisfactory condition to undergo the procedure. The                         anesthesia plan was to use monitored anesthesia care                         (MAC). Immediately prior to administration of                         medications, the patient was re-assessed for adequacy                         to receive sedatives. The heart rate, respiratory                         rate, oxygen saturations, blood pressure, adequacy of                         pulmonary ventilation, and response to care were                         monitored throughout the procedure. The physical                         status of the patient was re-assessed after the                         procedure.                        After obtaining informed consent, the colonoscope was                         passed under direct vision. Throughout the procedure,                         the patient's blood pressure, pulse, and oxygen                         saturations were monitored continuously. The  Colonoscope was introduced through the anus and                         advanced to the the cecum, identified by appendiceal                         orifice and ileocecal valve. The colonoscopy was                         technically difficult and complex due to a redundant                         colon. Successful completion of the procedure was                         aided by applying abdominal pressure. The patient                         tolerated the procedure well. The quality of the bowel                         preparation was fair. The ileocecal valve, appendiceal                         orifice, and rectum were  photographed. Findings:      The perianal and digital rectal examinations were normal.      A 2 mm polyp was found in the cecum. The polyp was sessile. The polyp       was removed with a jumbo cold forceps. Resection and retrieval were       complete. Estimated blood loss was minimal.      A 3 mm polyp was found in the ascending colon. The polyp was sessile.       The polyp was removed with a cold snare. Resection was complete, but the       polyp tissue was not retrieved. Estimated blood loss was minimal.      A 3 mm polyp was found in the sigmoid colon. The polyp was sessile. The       polyp was removed with a cold snare. Resection and retrieval were       complete. Estimated blood loss was minimal.      Internal hemorrhoids were found during retroflexion. The hemorrhoids       were Grade I (internal hemorrhoids that do not prolapse).      The exam was otherwise without abnormality on direct and retroflexion       views. Impression:            - Preparation of the colon was fair.                        - One 2 mm polyp in the cecum, removed with a jumbo                         cold forceps. Resected and retrieved.                        - One 3 mm polyp in the ascending colon, removed with  a cold snare. Complete resection. Polyp tissue not                         retrieved.                        - One 3 mm polyp in the sigmoid colon, removed with a                         cold snare. Resected and retrieved.                        - Internal hemorrhoids.                        - The examination was otherwise normal on direct and                         retroflexion views. Recommendation:        - Discharge patient to home.                        - Resume previous diet.                        - Continue present medications.                        - Await pathology results.                        - Repeat colonoscopy in 3 years for surveillance.                         - Return to referring physician as previously                         scheduled. Procedure Code(s):     --- Professional ---                        [phone], Colonoscopy, flexible; with removal of                         tumor(s), polyp(s), or other lesion(s) by snare                         technique                        45380, 59, Colonoscopy, flexible; with biopsy, single                         or multiple Diagnosis Code(s):     --- Professional ---                        K64.0, First degree hemorrhoids                        D12.0, Benign neoplasm of cecum                        D12.2,  Benign neoplasm of ascending colon                        D12.5, Benign neoplasm of sigmoid colon                        D50.9, Iron  deficiency anemia, unspecified CPT copyright 2022 American Medical Association. All rights reserved. The codes documented in this report are preliminary and upon coder review may  be revised to meet current compliance requirements. Leida Puna MD, MD 06/05/2023 11:39:00 AM Number of Addenda: 0 Note Initiated On: 06/05/2023 10:26 AM Scope Withdrawal Time: 0 hours 12 minutes 39 seconds  Total Procedure Duration: 0 hours 27 minutes 44 seconds  Estimated Blood Loss:  Estimated blood loss was minimal.      [facility]

## 2023-06-06 ENCOUNTER — Encounter (INDEPENDENT_AMBULATORY_CARE_PROVIDER_SITE_OTHER): Payer: Self-pay

## 2023-06-06 ENCOUNTER — Encounter: Payer: Self-pay | Admitting: Gastroenterology

## 2023-06-06 DIAGNOSIS — Z87891 Personal history of nicotine dependence: Secondary | ICD-10-CM | POA: Diagnosis not present

## 2023-06-06 DIAGNOSIS — M7989 Other specified soft tissue disorders: Secondary | ICD-10-CM | POA: Diagnosis not present

## 2023-06-06 DIAGNOSIS — E66811 Obesity, class 1: Secondary | ICD-10-CM | POA: Diagnosis not present

## 2023-06-06 DIAGNOSIS — E7849 Other hyperlipidemia: Secondary | ICD-10-CM | POA: Diagnosis not present

## 2023-06-06 DIAGNOSIS — I872 Venous insufficiency (chronic) (peripheral): Secondary | ICD-10-CM | POA: Diagnosis not present

## 2023-06-06 DIAGNOSIS — I959 Hypotension, unspecified: Secondary | ICD-10-CM | POA: Diagnosis not present

## 2023-06-06 DIAGNOSIS — I2510 Atherosclerotic heart disease of native coronary artery without angina pectoris: Secondary | ICD-10-CM | POA: Diagnosis not present

## 2023-06-06 DIAGNOSIS — R001 Bradycardia, unspecified: Secondary | ICD-10-CM | POA: Diagnosis not present

## 2023-06-06 DIAGNOSIS — I10 Essential (primary) hypertension: Secondary | ICD-10-CM | POA: Diagnosis not present

## 2023-06-06 DIAGNOSIS — G4733 Obstructive sleep apnea (adult) (pediatric): Secondary | ICD-10-CM | POA: Diagnosis not present

## 2023-06-07 NOTE — Anesthesia Postprocedure Evaluation (Signed)
 Anesthesia Post Note  Patient: [employer]  Procedure(s) Performed: EGD (ESOPHAGOGASTRODUODENOSCOPY) COLONOSCOPY POLYPECTOMY, INTESTINE  Patient location during evaluation: PACU Anesthesia Type: General Level of consciousness: awake and alert Pain management: pain level controlled Vital Signs Assessment: post-procedure vital signs reviewed and stable Respiratory status: spontaneous breathing, nonlabored ventilation, respiratory function stable and patient connected to nasal cannula oxygen Cardiovascular status: blood pressure returned to baseline and stable Postop Assessment: no apparent nausea or vomiting Anesthetic complications: no   No notable events documented.   Last Vitals:  Vitals:   06/05/23 1146 06/05/23 1155  BP: (!) 145/62 (!) 150/67  Pulse: (!) 54 (!) 51  Resp: 16 13  Temp:    SpO2: 100% 100%    Last Pain:  Vitals:   06/05/23 1155  TempSrc:   PainSc: 0-No pain                 Zula Hitch

## 2023-06-07 NOTE — Anesthesia Preprocedure Evaluation (Signed)
 Anesthesia Evaluation  Patient identified by MRN, date of birth, ID band Patient awake    Reviewed: Allergy  & Precautions, H&P , NPO status , Patient's Chart, lab work & pertinent test results, reviewed documented beta blocker date and time   History of Anesthesia Complications (+) history of anesthetic complications  Airway Mallampati: II   Neck ROM: full    Dental  (+) Poor Dentition   Pulmonary sleep apnea and Continuous Positive Airway Pressure Ventilation , former smoker   Pulmonary exam normal        Cardiovascular Exercise Tolerance: Poor hypertension, On Medications Normal cardiovascular exam+ dysrhythmias  Rhythm:regular Rate:Normal     Neuro/Psych  PSYCHIATRIC DISORDERS Anxiety Depression     Neuromuscular disease    GI/Hepatic Neg liver ROS,GERD  Medicated,,  Endo/Other  diabetesHypothyroidism    Renal/GU negative Renal ROS  negative genitourinary   Musculoskeletal   Abdominal   Peds  Hematology  (+) Blood dyscrasia, anemia   Anesthesia Other Findings Past Medical History: No date: Anemia No date: Anxiety No date: Bariatric surgery status No date: Complication of anesthesia     Comment:  Diificulty breathing for about 15 minutes after               bariatric surgery No date: Constipation No date: COVID-19 No date: Dysrhythmia No date: Elevated liver enzymes No date: GERD (gastroesophageal reflux disease) No date: Hemorrhoids No date: Herpes genitalis No date: High cholesterol No date: Hyperlipidemia No date: Hypertension No date: Hypothyroidism 02/09/2021: IDA (iron  deficiency anemia) No date: Neuropathy No date: Osteoarthritis No date: Sleep apnea     Comment:  CPAP Past Surgical History: No date: ABDOMINAL HYSTERECTOMY     Comment:  total for fibroids no h/o abnormal pap 2015: bariatric sleeve 1998: BREAST EXCISIONAL BIOPSY; Left No date: carpal tunnel repair 11/01/2021: CATARACT  EXTRACTION W/PHACO; Right     Comment:  Procedure: CATARACT EXTRACTION PHACO AND INTRAOCULAR               LENS PLACEMENT (IOC) RIGHT;  Surgeon: Rosa College,              MD;  Location: [facility];  Service:               Ophthalmology;  Laterality: Right;  sleep               apnea 4.95 00:49.7 11/15/2021: CATARACT EXTRACTION W/PHACO; Left     Comment:  Procedure: CATARACT EXTRACTION PHACO AND INTRAOCULAR               LENS PLACEMENT (IOC) LEFT 4.94 00:32.3;  Surgeon: Rosa College, MD;  Location: [facility];                Service: Ophthalmology;  Laterality: Left;  sleep apnea 06/05/2023: COLONOSCOPY; N/A     Comment:  Procedure: COLONOSCOPY;  Surgeon: Shane Darling,               MD;  Location: [facility] ENDOSCOPY;  Service: Endoscopy;                Laterality: N/A; 02/11/2016: COLONOSCOPY WITH PROPOFOL ; N/A     Comment:  Procedure: COLONOSCOPY WITH PROPOFOL ;  Surgeon: Luke Salaam, MD;  Location: [facility] ENDOSCOPY;  Service: Endoscopy;  Laterality: N/A; 10/01/2019: COLONOSCOPY WITH PROPOFOL ; N/A     Comment:  Procedure: COLONOSCOPY WITH PROPOFOL ;  Surgeon: Luke Salaam, MD;  Location: [facility] ENDOSCOPY;  Service:               Gastroenterology;  Laterality: N/A; 11/19/2020: COLONOSCOPY WITH PROPOFOL ; N/A     Comment:  Procedure: COLONOSCOPY WITH PROPOFOL ;  Surgeon: Luke Salaam, MD;  Location: [facility] ENDOSCOPY;  Service:               Gastroenterology;  Laterality: N/A; 05/05/2022: COLONOSCOPY WITH PROPOFOL ; N/A     Comment:  Procedure: COLONOSCOPY WITH PROPOFOL ;  Surgeon:               Shane Darling, MD;  Location: [facility] ENDOSCOPY;                Service: Endoscopy;  Laterality: N/A; 06/05/2023: ESOPHAGOGASTRODUODENOSCOPY; N/A     Comment:  Procedure: EGD (ESOPHAGOGASTRODUODENOSCOPY);  Surgeon:               Shane Darling, MD;  Location: [facility] ENDOSCOPY;                Service:  Endoscopy;  Laterality: N/A; 12/02/2019: ESOPHAGOGASTRODUODENOSCOPY (EGD) WITH PROPOFOL ; N/A     Comment:  Procedure: ESOPHAGOGASTRODUODENOSCOPY (EGD) WITH               PROPOFOL ;  Surgeon: Luke Salaam, MD;  Location: [facility]               ENDOSCOPY;  Service: Gastroenterology;  Laterality: N/A; 11/19/2020: ESOPHAGOGASTRODUODENOSCOPY (EGD) WITH PROPOFOL ; N/A     Comment:  Procedure: ESOPHAGOGASTRODUODENOSCOPY (EGD) WITH               PROPOFOL ;  Surgeon: Luke Salaam, MD;  Location: [facility]               ENDOSCOPY;  Service: Gastroenterology;  Laterality: N/A; 05/05/2022: ESOPHAGOGASTRODUODENOSCOPY (EGD) WITH PROPOFOL ; N/A     Comment:  Procedure: ESOPHAGOGASTRODUODENOSCOPY (EGD) WITH               PROPOFOL ;  Surgeon: Shane Darling, MD;  Location:               [facility] ENDOSCOPY;  Service: Endoscopy;  Laterality: N/A; 02/06/2021: FLEXIBLE SIGMOIDOSCOPY; N/A     Comment:  Procedure: FLEXIBLE SIGMOIDOSCOPY;  Surgeon: Quintin Buckle, DO;  Location: [facility] ENDOSCOPY;  Service:               Gastroenterology;  Laterality: N/A; 06/07/2021: GIVENS CAPSULE STUDY; N/A     Comment:  Procedure: GIVENS CAPSULE STUDY;  Surgeon: Selena Daily, MD;  Location: [facility] ENDOSCOPY;  Service:               Gastroenterology;  Laterality: N/A; 06/09/2021: GIVENS CAPSULE STUDY; N/A     Comment:  Procedure: GIVENS CAPSULE STUDY;  Surgeon: Selena Daily, MD;  Location: [facility] ENDOSCOPY;  Service:               Gastroenterology;  Laterality: N/A; No date: HEMORRHOID SURGERY No date: LAPAROSCOPIC GASTRIC RESTRICTIVE DUODENAL PROCEDURE  (  DUODENAL SWITCH); Bilateral     Comment:  2020 06/05/2023: POLYPECTOMY     Comment:  Procedure: POLYPECTOMY, INTESTINE;  Surgeon: Shane Darling, MD;  Location: [facility] ENDOSCOPY;  Service:               Endoscopy;; BMI    Body Mass Index: 32.31 kg/m     Reproductive/Obstetrics negative OB ROS                              Anesthesia Physical Anesthesia Plan  ASA: 3  Anesthesia Plan: General   Post-op Pain Management:    Induction:   PONV Risk Score and Plan:   Airway Management Planned:   Additional Equipment:   Intra-op Plan:   Post-operative Plan:   Informed Consent: I have reviewed the patients History and Physical, chart, labs and discussed the procedure including the risks, benefits and alternatives for the proposed anesthesia with the patient or authorized representative who has indicated his/her understanding and acceptance.     Dental Advisory Given  Plan Discussed with: CRNA  Anesthesia Plan Comments:        Anesthesia Quick Evaluation

## 2023-06-08 LAB — SURGICAL PATHOLOGY
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
## 2023-06-14 ENCOUNTER — Inpatient Hospital Stay

## 2023-06-15 ENCOUNTER — Other Ambulatory Visit: Payer: Self-pay | Admitting: Family

## 2023-06-19 ENCOUNTER — Other Ambulatory Visit: Payer: Medicare Other

## 2023-06-20 ENCOUNTER — Ambulatory Visit: Payer: Medicare Other

## 2023-06-20 ENCOUNTER — Ambulatory Visit: Payer: Medicare Other | Admitting: Oncology

## 2023-06-22 ENCOUNTER — Encounter (INDEPENDENT_AMBULATORY_CARE_PROVIDER_SITE_OTHER): Payer: Self-pay | Admitting: Nurse Practitioner

## 2023-06-22 ENCOUNTER — Ambulatory Visit (INDEPENDENT_AMBULATORY_CARE_PROVIDER_SITE_OTHER): Payer: Medicare Other | Admitting: Nurse Practitioner

## 2023-06-22 VITALS — BP 128/69 | HR 50 | Resp 18 | Ht 61.0 in | Wt 172.4 lb

## 2023-06-22 DIAGNOSIS — G629 Polyneuropathy, unspecified: Secondary | ICD-10-CM | POA: Diagnosis not present

## 2023-06-22 DIAGNOSIS — L97412 Non-pressure chronic ulcer of right heel and midfoot with fat layer exposed: Secondary | ICD-10-CM | POA: Diagnosis not present

## 2023-06-22 DIAGNOSIS — E119 Type 2 diabetes mellitus without complications: Secondary | ICD-10-CM

## 2023-06-22 DIAGNOSIS — I890 Lymphedema, not elsewhere classified: Secondary | ICD-10-CM | POA: Diagnosis not present

## 2023-06-22 NOTE — Progress Notes (Signed)
 Subjective:    Patient ID: Jennifer [name], female    DOB: 1950/12/18, 73 y.o.   MRN: [id number] Chief Complaint  Patient presents with   Follow-up    Follow up 6 months R heel ulcer/Bilateral leg lymphedema    Jennifer [name] is a 73 year old female who presents today for follow-up of her lymphedema.  Since her wound is healed she has been able to utilize medical grade compression socks on her bilateral lower extremities.  She has also been continuing with use of her lymphedema pump.  She still has some swelling more so near the ankle area but overall it appears to be under good control.  She has a very small blister on her right great toe which just came up.  It is intact with some bruising noted.       Review of Systems  Cardiovascular:  Positive for leg swelling.  All other systems reviewed and are negative.      Objective:    Physical Exam Vitals reviewed.  HENT:     Head: Normocephalic.  Cardiovascular:     Rate and Rhythm: Normal rate.     Pulses:          Dorsalis pedis pulses are 1+ on the right side and 1+ on the left side.  Pulmonary:     Effort: Pulmonary effort is normal.  Musculoskeletal:     Right lower leg: 1+ Edema present.     Left lower leg: 1+ Edema present.  Skin:    General: Skin is warm and dry.  Neurological:     Mental Status: She is alert and oriented to person, place, and time.  Psychiatric:        Behavior: Behavior normal.        Thought Content: Thought content normal.        Judgment: Judgment normal.     BP 128/69 (BP Location: Left Arm, Patient Position: Sitting, Cuff Size: Normal)   Pulse (!) 50   Resp 18   Ht 5\' 1"  (1.549 m)   Wt 172 lb 6.4 oz (78.2 kg)   BMI 32.57 kg/m   Past Medical History:  Diagnosis Date   Anemia    Anxiety    Bariatric surgery status    Complication of anesthesia    Diificulty breathing for about 15 minutes after bariatric surgery   Constipation    COVID-19    Dysrhythmia    Elevated  liver enzymes    GERD (gastroesophageal reflux disease)    Hemorrhoids    Herpes genitalis    High cholesterol    Hyperlipidemia    Hypertension    Hypothyroidism    IDA (iron  deficiency anemia) 02/09/2021   Neuropathy    Osteoarthritis    Sleep apnea    CPAP    Social History   Socioeconomic History   Marital status: Married    Spouse name: Not on file   Number of children: Not on file   Years of education: Not on file   Highest education level: 12th grade  Occupational History   Not on file  Tobacco Use   Smoking status: Former    Current packs/day: 0.00    Average packs/day: 1.5 packs/day for 24.0 years (36.0 ttl pk-yrs)    Types: Cigarettes    Start date: 75    Quit date: 93    Years since quitting: 32.4   Smokeless tobacco: Never   Tobacco comments:    quit 1995.   Vaping Use  Vaping status: Never Used  Substance and Sexual Activity   Alcohol  use: Not Currently    Alcohol /week: 14.0 standard drinks of alcohol     Types: 14 Glasses of wine per week   Drug use: No   Sexual activity: Not Currently    Birth control/protection: Surgical    Comment: Hysterectomy  Other Topics Concern   Not on file  Social History Narrative   Lives in [place].    Married.    Retired 2015, Engineer, structural.    One son; granddaughter.    Left handed    Caffeine- decaf coffee.    Social Drivers of Corporate investment banker Strain: Low Risk  (04/17/2023)   Overall Financial Resource Strain (CARDIA)    Difficulty of Paying Living Expenses: Not hard at all  Food Insecurity: No Food Insecurity (04/17/2023)   Hunger Vital Sign    Worried About Running Out of Food in the Last Year: Never true    Ran Out of Food in the Last Year: Never true  Transportation Needs: No Transportation Needs (04/17/2023)   PRAPARE - Administrator, Civil Service (Medical): No    Lack of Transportation (Non-Medical): No  Physical Activity: Insufficiently Active (04/17/2023)    Exercise Vital Sign    Days of Exercise per Week: 3 days    Minutes of Exercise per Session: 20 min  Stress: No Stress Concern Present (04/17/2023)   [employer] of Occupational Health - Occupational Stress Questionnaire    Feeling of Stress : Only a little  Social Connections: Socially Isolated (04/17/2023)   Social Connection and Isolation Panel [NHANES]    Frequency of Communication with Friends and Family: Once a week    Frequency of Social Gatherings with Friends and Family: Once a week    Attends Religious Services: Never    Database administrator or Organizations: No    Attends Banker Meetings: Never    Marital Status: Married  Catering manager Violence: Not At Risk (04/17/2023)   Humiliation, Afraid, Rape, and Kick questionnaire    Fear of Current or Ex-Partner: No    Emotionally Abused: No    Physically Abused: No    Sexually Abused: No    Past Surgical History:  Procedure Laterality Date   ABDOMINAL HYSTERECTOMY     total for fibroids no h/o abnormal pap   bariatric sleeve  2015   BREAST EXCISIONAL BIOPSY Left 1998   carpal tunnel repair     CATARACT EXTRACTION W/PHACO Right 11/01/2021   Procedure: CATARACT EXTRACTION PHACO AND INTRAOCULAR LENS PLACEMENT (IOC) RIGHT;  Surgeon: Rosa College, MD;  Location: [facility];  Service: Ophthalmology;  Laterality: Right;  sleep apnea 4.95 00:49.7   CATARACT EXTRACTION W/PHACO Left 11/15/2021   Procedure: CATARACT EXTRACTION PHACO AND INTRAOCULAR LENS PLACEMENT (IOC) LEFT 4.94 00:32.3;  Surgeon: Rosa College, MD;  Location: [facility];  Service: Ophthalmology;  Laterality: Left;  sleep apnea   COLONOSCOPY N/A 06/05/2023   Procedure: COLONOSCOPY;  Surgeon: Shane Darling, MD;  Location: [facility] ENDOSCOPY;  Service: Endoscopy;  Laterality: N/A;   COLONOSCOPY WITH PROPOFOL  N/A 02/11/2016   Procedure: COLONOSCOPY WITH PROPOFOL ;  Surgeon: Luke Salaam, MD;  Location: [facility] ENDOSCOPY;   Service: Endoscopy;  Laterality: N/A;   COLONOSCOPY WITH PROPOFOL  N/A 10/01/2019   Procedure: COLONOSCOPY WITH PROPOFOL ;  Surgeon: Luke Salaam, MD;  Location: [facility]COPY;  Service: Gastroenterology;  Laterality: N/A;   COLONOSCOPY WITH PROPOFOL  N/A 11/19/2020  Procedure: COLONOSCOPY WITH PROPOFOL ;  Surgeon: Luke Salaam, MD;  Location: [facility] ENDOSCOPY;  Service: Gastroenterology;  Laterality: N/A;   COLONOSCOPY WITH PROPOFOL  N/A 05/05/2022   Procedure: COLONOSCOPY WITH PROPOFOL ;  Surgeon: Shane Darling, MD;  Location: [facility] ENDOSCOPY;  Service: Endoscopy;  Laterality: N/A;   ESOPHAGOGASTRODUODENOSCOPY N/A 06/05/2023   Procedure: EGD (ESOPHAGOGASTRODUODENOSCOPY);  Surgeon: Shane Darling, MD;  Location: [facility] ENDOSCOPY;  Service: Endoscopy;  Laterality: N/A;   ESOPHAGOGASTRODUODENOSCOPY (EGD) WITH PROPOFOL  N/A 12/02/2019   Procedure: ESOPHAGOGASTRODUODENOSCOPY (EGD) WITH PROPOFOL ;  Surgeon: Luke Salaam, MD;  Location: [facility] ENDOSCOPY;  Service: Gastroenterology;  Laterality: N/A;   ESOPHAGOGASTRODUODENOSCOPY (EGD) WITH PROPOFOL  N/A 11/19/2020   Procedure: ESOPHAGOGASTRODUODENOSCOPY (EGD) WITH PROPOFOL ;  Surgeon: Luke Salaam, MD;  Location: [facility] END[facility]COPY;  Service: Gastroenterology;  Laterality: N/A;   ESOPHAGOGASTRODUODENOSCOPY (EGD) WITH PROPOFOL  N/A 05/05/2022   Procedure: ESOPHAGOGASTRODUODENOSCOPY (EGD) WITH PROPOFOL ;  Surgeon: Shane Darling, MD;  Location: [facility] ENDOSCOPY;  Service: Endoscopy;  Laterality: N/A;   FLEXIBLE SIGMOIDOSCOPY N/A 02/06/2021   Procedure: FLEXIBLE SIGMOIDOSCOPY;  Surgeon: Quintin Buckle, DO;  Location: [facility] ENDOSCOPY;  Service: Gastroenterology;  Laterality: N/A;   GIVENS CAPSULE STUDY N/A 06/07/2021   Procedure: GIVENS CAPSULE STUDY;  Surgeon: Selena Daily, MD;  Location: [facility] ENDOSCOPY;  Service: Gastroenterology;  Laterality: N/A;   GIVENS CAPSULE STUDY N/A 06/09/2021   Procedure: GIVENS CAPSULE STUDY;  Surgeon: Selena Daily, MD;   Location: [facility] ENDOSCOPY;  Service: Gastroenterology;  Laterality: N/A;   HEMORRHOID SURGERY     LAPAROSCOPIC GASTRIC RESTRICTIVE DUODENAL PROCEDURE (DUODENAL SWITCH) Bilateral    2020   POLYPECTOMY  06/05/2023   Procedure: POLYPECTOMY, INTESTINE;  Surgeon: Shane Darling, MD;  Location: [facility] ENDOSCOPY;  Service: Endoscopy;;    Family History  Problem Relation Age of Onset   Hypertension Mother    Heart disease Father    Alcohol  abuse Father    Breast cancer Sister 54       materal 1/2 sister   Hypertension Sister    Hypertension Brother     Allergies  Allergen Reactions   Bee Pollen Itching   Celecoxib Hives   Hydrocodone  Nausea Only   Pollen Extract Itching   Sodium Ferric Gluconate [Ferrous Gluconate]     IV ferric gluconate - shortly after the infusion developed back pain shooting into left arm and bilateral hand swelling   Nasacort  [Triamcinolone ] Other (See Comments)    Nasal - Nose Bleeds   Vicodin [Hydrocodone -Acetaminophen ] Nausea Only       Latest Ref Rng & Units 05/05/2023   10:29 AM 12/19/2022   11:23 AM 10/27/2022    8:25 AM  CBC  WBC 4.0 - 10.5 K/uL 5.5  5.5  5.3   Hemoglobin 12.0 - 15.0 g/dL 8.6  08.6  57.8   Hematocrit 36.0 - 46.0 % 28.0  36.4  35.6   Platelets 150 - 400 K/uL 141  161  140        CMP     Component Value Date/Time   NA 138 12/19/2022 1123   NA 138 10/14/2020 1038   NA 139 06/26/2013 0409   K 4.2 12/19/2022 1123   K 4.3 06/26/2013 0409   CL 98 12/19/2022 1123   CL 106 06/26/2013 0409   CO2 30 12/19/2022 1123   CO2 28 06/26/2013 0409   GLUCOSE 103 (H) 12/19/2022 1123   GLUCOSE 108 (H) 06/26/2013 0409   BUN 20 12/19/2022 1123   BUN 11 10/14/2020 1038   BUN 12 06/26/2013 0409  CREATININE 0.93 12/19/2022 1123   CREATININE 0.72 06/26/2013 0409   CALCIUM  9.0 12/19/2022 1123   CALCIUM  8.5 06/26/2013 0409   PROT 7.2 12/19/2022 1123   PROT 7.2 10/14/2020 1038   ALBUMIN 4.3 12/19/2022 1123   ALBUMIN 4.9 (H) 10/14/2020 1038    ALBUMIN 3.6 06/26/2013 0409   AST 60 (H) 12/19/2022 1123   ALT 40 12/19/2022 1123   ALKPHOS 105 12/19/2022 1123   BILITOT 0.5 12/19/2022 1123   GFR 60.29 11/25/2022 0945   EGFR 72 10/14/2020 1038   GFRNONAA >60 12/19/2022 1123   GFRNONAA >60 06/26/2013 0409     No results found.     Assessment & Plan:   1. Lymphedema (Primary) Recommend:  No surgery or intervention at this point in time.    I have reviewed my discussion with the patient regarding lymphedema and why it  causes symptoms.  Patient will continue wearing graduated compression on a daily basis. The patient should put the compression on first thing in the morning and removing them in the evening. The patient should not sleep in the compression.   In addition, behavioral modification throughout the day will be continued.  This will include frequent elevation (such as in a recliner), use of over the counter pain medications as needed and exercise such as walking.  The systemic causes for chronic edema such as liver, kidney and cardiac etiologies does not appear to have significant changed over the past year.    The patient will continue aggressive use of the  lymph pump.  This will continue to improve the edema control and prevent sequela such as ulcers and infections.    2. Ulcer of heel, right, with fat layer exposed (HCC) This is healed  3. Neuropathy Patient notes her neuropathy feels a little bit worse and it is starting to affect her balance.  She had arterial studies which showed no significant arterial disease.  Will place referral to neurology for patient. - Ambulatory referral to Neurology  4. Diabetes mellitus without complication (HCC) Continue hypoglycemic medications as already ordered, these medications have been reviewed and there are no changes at this time.  Hgb A1C to be monitored as already arranged by primary service   Current Outpatient Medications on File Prior to Visit  Medication Sig  Dispense Refill   acetaminophen  (TYLENOL ) 325 MG tablet Take 2 tablets (650 mg total) by mouth every 6 (six) hours as needed for mild pain (or Fever >/= 101).     acyclovir  (ZOVIRAX ) 400 MG tablet Take 1 tablet (400 mg total) by mouth 2 (two) times daily. 180 tablet 3   famotidine  (PEPCID ) 20 MG tablet TAKE 1 TABLET (20 MG TOTAL) BY MOUTH AT BEDTIME AS NEEDED FOR HEARTBURN OR INDIGESTION. 30 tablet 5   FLUoxetine (PROZAC) 20 MG capsule Take 40 mg by mouth daily.     folic acid  (FOLVITE ) 1 MG tablet Take 1 tablet (1 mg total) by mouth daily. 90 tablet 3   gabapentin  (NEURONTIN ) 100 MG capsule Take 100mg  PO qam and 100mg  evening. 180 capsule 3   gabapentin  (NEURONTIN ) 300 MG capsule Take 900mg  qam, 300mg  at noon, and 900mg  at bedtime. 150 capsule 5   hydrocortisone  (ANUSOL -HC) 25 MG suppository Unwrap and insert 1 suppository rectally twice a day 12 suppository 1   levothyroxine  (SYNTHROID ) 50 MCG tablet Take 1 tablet (50 mcg total) by mouth daily. 90 tablet 3   losartan  (COZAAR ) 100 MG tablet TAKE 1 TABLET (100 MG TOTAL) BY MOUTH DAILY.  TAKE 1 TABLET BY MOUTH EVERYDAY AT BEDTIME 90 tablet 1   Multiple Vitamin (MULTIVITAMIN WITH MINERALS) TABS tablet Take 1 tablet by mouth daily.     naltrexone (DEPADE) 50 MG tablet Take by mouth daily. Patient takes  one  50 mg tablet daily at the same time     potassium chloride  SA (KLOR-CON  M) 20 MEQ tablet Take 1 tablet (20 mEq total) by mouth daily.     rosuvastatin  (CRESTOR ) 5 MG tablet Take 2.5 mg by mouth daily.     torsemide  (DEMADEX ) 20 MG tablet Take 1 tablet (20 mg total) by mouth daily.     traZODone  (DESYREL ) 50 MG tablet TAKE 1 TABLET BY MOUTH EVERY DAY FOR SLEEP 90 tablet 4   fluticasone  (FLONASE ) 50 MCG/ACT nasal spray INSTILL 1 SPRAY INTO BOTH NOSTRILS DAILY 48 mL 0   sodium chloride  (OCEAN) 0.65 % SOLN nasal spray Place 2 sprays into both nostrils as needed for congestion. (Patient not taking: Reported on 05/11/2023) 30 mL 2    triamterene -hydrochlorothiazide  (DYAZIDE ) 37.5-25 MG capsule TAKE 1 EACH (1 CAPSULE TOTAL) BY MOUTH DAILY. (Patient not taking: Reported on 06/22/2023) 90 capsule 3   No current facility-administered medications on file prior to visit.    There are no Patient Instructions on file for this visit. No follow-ups on file.   Laquanda Bick E Jalin Alicea, NP

## 2023-06-26 ENCOUNTER — Ambulatory Visit (INDEPENDENT_AMBULATORY_CARE_PROVIDER_SITE_OTHER): Admitting: Podiatry

## 2023-06-26 ENCOUNTER — Encounter: Payer: Self-pay | Admitting: Podiatry

## 2023-06-26 DIAGNOSIS — L600 Ingrowing nail: Secondary | ICD-10-CM

## 2023-06-26 DIAGNOSIS — S90422A Blister (nonthermal), left great toe, initial encounter: Secondary | ICD-10-CM

## 2023-06-26 NOTE — Progress Notes (Signed)
  Subjective:  Patient ID: Jennifer [name], female    DOB: 02-May-1950,  MRN: [id number]  Chief Complaint  Patient presents with   Toe Injury    "I put some closed in shoes on to walk in the yard.  When I came back in, there was a blood blister.  I have an ingrown toenail.  I thought I could get it out myself but I couldn't."    73 y.o. female presents with the above complaint. History confirmed with patient.  Previously had the ingrown toenail removed but some of the inside corner grew back.  Has not had any drainage or redness around the blood blister site  Objective:  Physical Exam: warm, good capillary refill, normal DP and PT pulses, normal sensory exam, and well-organized nonfluctuant blood blister distal tip of hallux healing well, recurrent growth of nail from previous total matricectomy on the medial border without signs of infection painful  Assessment:   1. Blister of left great toe, initial encounter   2. Ingrowing left great toenail      Plan:  Patient was evaluated and treated and all questions answered.  Blood blister appears to be minor and healing well.  She may utilize lotion or Vaseline on this area.  Notify me if any sign of infection or ulceration develops here which I do not expect.  Also had an ingrowing nail today, we discussed removal of this with repeat matricectomy, due to the recent injury with a blood blister I prefer not to do this today but I did debride the ingrown corner for to alleviate pain and pressure, this required local anesthetic with a digital block with 3 cc of lidocaine  2% plain.  She tolerated that well.  Post care instructions given.  Follow-up and 4 to 6 weeks to reevaluate and we will proceed with partial permanent matricectomy at that time  Return in about 1 month (around 07/26/2023) for remove recurrent ingrown nail L medial.

## 2023-06-28 ENCOUNTER — Ambulatory Visit: Admitting: Family

## 2023-06-28 DIAGNOSIS — E1142 Type 2 diabetes mellitus with diabetic polyneuropathy: Secondary | ICD-10-CM | POA: Diagnosis not present

## 2023-06-28 DIAGNOSIS — M7582 Other shoulder lesions, left shoulder: Secondary | ICD-10-CM | POA: Diagnosis not present

## 2023-06-28 DIAGNOSIS — G8929 Other chronic pain: Secondary | ICD-10-CM | POA: Diagnosis not present

## 2023-06-28 DIAGNOSIS — M7581 Other shoulder lesions, right shoulder: Secondary | ICD-10-CM | POA: Diagnosis not present

## 2023-06-29 DIAGNOSIS — M7989 Other specified soft tissue disorders: Secondary | ICD-10-CM | POA: Diagnosis not present

## 2023-06-29 DIAGNOSIS — I10 Essential (primary) hypertension: Secondary | ICD-10-CM | POA: Diagnosis not present

## 2023-07-11 ENCOUNTER — Ambulatory Visit (INDEPENDENT_AMBULATORY_CARE_PROVIDER_SITE_OTHER): Admitting: Family

## 2023-07-11 VITALS — BP 132/62 | HR 50 | Temp 98.3°F

## 2023-07-11 DIAGNOSIS — I10 Essential (primary) hypertension: Secondary | ICD-10-CM | POA: Diagnosis not present

## 2023-07-11 DIAGNOSIS — G629 Polyneuropathy, unspecified: Secondary | ICD-10-CM | POA: Diagnosis not present

## 2023-07-11 MED ORDER — TRIAMTERENE-HCTZ 37.5-25 MG PO TABS: ORAL_TABLET | ORAL | 3 refills | Status: AC

## 2023-07-11 NOTE — Patient Instructions (Addendum)
 Your blood pressure appears more labile.   Take triamterene  hydrochlorothiazide   0.5  to 1 tablet daily prn for BP > 140/80.  Let me know how you are doing  Referral to neurology  Let us  know if you dont hear back within a week in regards to an appointment being scheduled.   So that you are aware, if you are [facility] MyChart user , please pay attention to your MyChart messages as you may receive a MyChart message with a phone number to call and schedule this test/appointment own your own from our referral coordinator. This is a new process so I do not want you to miss this message.  If you are not a MyChart user, you will receive a phone call.

## 2023-07-11 NOTE — Progress Notes (Unsigned)
 Assessment & Plan:  There are no diagnoses linked to this encounter.   Return precautions given.   Risks, benefits, and alternatives of the medications and treatment plan prescribed today were discussed, and patient expressed understanding.   Education regarding symptom management and diagnosis given to patient on AVS either electronically or printed.  No follow-ups on file.  [name], FNP  Subjective:    Patient ID: Jennifer [name], female    DOB: [birth year], 73 y.o.   MRN: [id number]  CC: Jennifer [name] is a 73 y.o. female who presents today for follow up.   HPI: She is concerned regarding blood pressure. She had low end blood pressure 6 weeks ago. She is now concerned bp is elevated  She restarted triamterene  hydrochlorothiazide  yesterday.   Denies CP, sob  BLE edema is unchanged.    She is compliant with losartan  100mg  wd Follow up cardiology 06/06/23  72-hr Holter 3/20-3/23/25 revealed predominant sinus rhythm with sinus bradycardia.  Restarted losartan  at 25 mg qd and ultimately increased back to 50 mg qd at Nurse Visit 05/23/23   Following with hematology for iron  infusions  EGD/colonoscopy 05/2023  Increase lasix to 40mg  BLE Continued on crestor   Allergies: Bee pollen, Celecoxib, Hydrocodone , Pollen extract, Sodium ferric gluconate [ferrous gluconate], Nasacort  [triamcinolone ], and Vicodin [hydrocodone -acetaminophen ] Current Outpatient Medications on File Prior to Visit  Medication Sig Dispense Refill   acetaminophen  (TYLENOL ) 325 MG tablet Take 2 tablets (650 mg total) by mouth every 6 (six) hours as needed for mild pain (or Fever >/= 101).     acyclovir  (ZOVIRAX ) 400 MG tablet Take 1 tablet (400 mg total) by mouth 2 (two) times daily. 180 tablet 3   famotidine  (PEPCID ) 20 MG tablet TAKE 1 TABLET (20 MG TOTAL) BY MOUTH AT BEDTIME AS NEEDED FOR HEARTBURN OR INDIGESTION. 30 tablet 5   FLUoxetine (PROZAC) 20 MG capsule Take 40 mg by mouth daily.      fluticasone  (FLONASE ) 50 MCG/ACT nasal spray INSTILL 1 SPRAY INTO BOTH NOSTRILS DAILY 48 mL 0   folic acid  (FOLVITE ) 1 MG tablet Take 1 tablet (1 mg total) by mouth daily. 90 tablet 3   furosemide (LASIX) 20 MG tablet Take 40 mg by mouth daily.     gabapentin  (NEURONTIN ) 100 MG capsule Take 100mg  PO qam and 100mg  evening. 180 capsule 3   gabapentin  (NEURONTIN ) 300 MG capsule Take 900mg  qam, 300mg  at noon, and 900mg  at bedtime. 150 capsule 5   hydrocortisone  (ANUSOL -HC) 25 MG suppository Unwrap and insert 1 suppository rectally twice a day 12 suppository 1   levothyroxine  (SYNTHROID ) 50 MCG tablet Take 1 tablet (50 mcg total) by mouth daily. 90 tablet 3   losartan  (COZAAR ) 100 MG tablet TAKE 1 TABLET (100 MG TOTAL) BY MOUTH DAILY. TAKE 1 TABLET BY MOUTH EVERYDAY AT BEDTIME 90 tablet 1   Multiple Vitamin (MULTIVITAMIN WITH MINERALS) TABS tablet Take 1 tablet by mouth daily.     naltrexone (DEPADE) 50 MG tablet Take by mouth daily. Patient takes  one  50 mg tablet daily at the same time     potassium chloride  SA (KLOR-CON  M) 20 MEQ tablet Take 1 tablet (20 mEq total) by mouth daily.     rosuvastatin  (CRESTOR ) 5 MG tablet Take 2.5 mg by mouth daily.     RYALTRIS 665-25 MCG/ACT SUSP Place 1 spray into the nose at bedtime. 1 spray each nostril     traZODone  (DESYREL ) 50 MG tablet TAKE 1 TABLET BY MOUTH EVERY DAY FOR SLEEP  90 tablet 4   sodium chloride  (OCEAN) 0.65 % SOLN nasal spray Place 2 sprays into both nostrils as needed for congestion. (Patient not taking: Reported on 07/11/2023) 30 mL 2   No current facility-administered medications on file prior to visit.    Review of Systems    Objective:    BP 132/62   Pulse (!) 50   Temp 98.3 F (36.8 C) (Oral)   SpO2 99%  BP Readings from Last 3 Encounters:  07/11/23 132/62  06/22/23 128/69  06/05/23 (!) 150/67   Wt Readings from Last 3 Encounters:  06/22/23 172 lb 6.4 oz (78.2 kg)  06/05/23 171 lb (77.6 kg)  05/11/23 177 lb (80.3 kg)     Physical Exam

## 2023-07-13 NOTE — Assessment & Plan Note (Addendum)
 Chronic, suboptimal control.  Continue gabapentin  100mg  with morning 900mg , take 300mg  at lunch and 100mg  with evening dose gabapentin  900mg . max daily dose 2300mg /day .  We opted not to change gabapentin  to lyrica  due to concern for being on a controlled substance in setting of alcohol  abuse.   referral to neurology for further evaluation, treatment

## 2023-07-13 NOTE — Assessment & Plan Note (Signed)
 Well-controlled today.  Blood pressure historically labile.  Continue losartan  100 mg daily. Advised to have a prn approach ( for now) and take triamterene  hydrochlorothiazide   0.5  to 1 tablet daily prn for BP > 140/80.  Continue Lasix 40 mg daily as prescribed by cardiology for leg swelling.

## 2023-07-19 DIAGNOSIS — D500 Iron deficiency anemia secondary to blood loss (chronic): Secondary | ICD-10-CM | POA: Diagnosis not present

## 2023-07-20 ENCOUNTER — Other Ambulatory Visit: Payer: Self-pay | Admitting: Obstetrics

## 2023-07-20 ENCOUNTER — Ambulatory Visit (INDEPENDENT_AMBULATORY_CARE_PROVIDER_SITE_OTHER): Admitting: Obstetrics

## 2023-07-20 ENCOUNTER — Encounter: Payer: Self-pay | Admitting: Obstetrics

## 2023-07-20 VITALS — BP 116/66 | HR 62 | Ht 61.0 in | Wt 172.0 lb

## 2023-07-20 DIAGNOSIS — N644 Mastodynia: Secondary | ICD-10-CM

## 2023-07-20 NOTE — Progress Notes (Signed)
 GYN ENCOUNTER  Subjective  HPI: Jennifer [name] is a 73 y.o. [phone] who presents today for intermittent pain in her right breast. The pain is dull and achy and has been coming and going for the past 1-2 months. It is in nipple and lower quadrant. It gets worse with heavy lifting. She has a h/o lumpectomy on her left breast.  Past Medical History:  Diagnosis Date   Anemia    Anxiety    Bariatric surgery status    Complication of anesthesia    Diificulty breathing for about 15 minutes after bariatric surgery   Constipation    COVID-19    Dysrhythmia    Elevated liver enzymes    GERD (gastroesophageal reflux disease)    Hemorrhoids    Herpes genitalis    High cholesterol    Hyperlipidemia    Hypertension    Hypothyroidism    IDA (iron  deficiency anemia) 02/09/2021   Neuropathy    Osteoarthritis    Sleep apnea    CPAP   Past Surgical History:  Procedure Laterality Date   ABDOMINAL HYSTERECTOMY     total for fibroids no h/o abnormal pap   bariatric sleeve  2015   BREAST EXCISIONAL BIOPSY Left 1998   carpal tunnel repair     CATARACT EXTRACTION W/PHACO Right 11/01/2021   Procedure: CATARACT EXTRACTION PHACO AND INTRAOCULAR LENS PLACEMENT (IOC) RIGHT;  Surgeon: Myrna Adine Anes, MD;  Location: [facility];  Service: Ophthalmology;  Laterality: Right;  sleep apnea 4.95 00:49.7   CATARACT EXTRACTION W/PHACO Left 11/15/2021   Procedure: CATARACT EXTRACTION PHACO AND INTRAOCULAR LENS PLACEMENT (IOC) LEFT 4.94 00:32.3;  Surgeon: Myrna Adine Anes, MD;  Location: [facility];  Service: Ophthalmology;  Laterality: Left;  sleep apnea   COLONOSCOPY N/A 06/05/2023   Procedure: COLONOSCOPY;  Surgeon: Maryruth Ole DASEN, MD;  Location: [facility] ENDOSCOPY;  Service: Endoscopy;  Laterality: N/A;   COLONOSCOPY WITH PROPOFOL  N/A 02/11/2016   Procedure: COLONOSCOPY WITH PROPOFOL ;  Surgeon: Ruel Kung, MD;  Location: [facility] ENDOSCOPY;  Service: Endoscopy;  Laterality: N/A;    COLONOSCOPY WITH PROPOFOL  N/A 10/01/2019   Procedure: COLONOSCOPY WITH PROPOFOL ;  Surgeon: Kung Ruel, MD;  Location: [facility] ENDOSCOPY;  Service: Gastroenterology;  Laterality: N/A;   COLONOSCOPY WITH PROPOFOL  N/A 11/19/2020   Procedure: COLONOSCOPY WITH PROPOFOL ;  Surgeon: Kung Ruel, MD;  Location: [facility] ENDOSCOPY;  Service: Gastroenterology;  Laterality: N/A;   COLONOSCOPY WITH PROPOFOL  N/A 05/05/2022   Procedure: COLONOSCOPY WITH PROPOFOL ;  Surgeon: Maryruth Ole DASEN, MD;  Location: [facility] ENDOSCOPY;  Service: Endoscopy;  Laterality: N/A;   ESOPHAGOGASTRODUODENOSCOPY N/A 06/05/2023   Procedure: EGD (ESOPHAGOGASTRODUODENOSCOPY);  Surgeon: Maryruth Ole DASEN, MD;  Location: [facility] ENDOSCOPY;  Service: Endoscopy;  Laterality: N/A;   ESOPHAGOGASTRODUODENOSCOPY (EGD) WITH PROPOFOL  N/A 12/02/2019   Procedure: ESOPHAGOGASTRODUODENOSCOPY (EGD) WITH PROPOFOL ;  Surgeon: Kung Ruel, MD;  Location: [facility] ENDOSCOPY;  Service: Gastroenterology;  Laterality: N/A;   ESOPHAGOGASTRODUODENOSCOPY (EGD) WITH PROPOFOL  N/A 11/19/2020   Procedure: ESOPHAGOGASTRODUODENOSCOPY (EGD) WITH PROPOFOL ;  Surgeon: Kung Ruel, MD;  Location: [facility] ENDOSCOPY;  Service: Gastroenterology;  Laterality: N/A;   ESOPHAGOGASTRODUODENOSCOPY (EGD) WITH PROPOFOL  N/A 05/05/2022   Procedure: ESOPHAGOGASTRODUODENOSCOPY (EGD) WITH PROPOFOL ;  Surgeon: Maryruth Ole DASEN, MD;  Location: [facility] ENDOSCOPY;  Service: Endoscopy;  Laterality: N/A;   FLEXIBLE SIGMOIDOSCOPY N/A 02/06/2021   Procedure: FLEXIBLE SIGMOIDOSCOPY;  Surgeon: Onita Elspeth Sharper, DO;  Location: [facility] ENDOSCOPY;  Service: Gastroenterology;  Laterality: N/A;   GIVENS CAPSULE STUDY N/A 06/07/2021   Procedure: GIVENS CAPSULE STUDY;  Surgeon: Unk,  Corinn Skiff, MD;  Location: [facility] ENDOSCOPY;  Service: Gastroenterology;  Laterality: N/A;   GIVENS CAPSULE STUDY N/A 06/09/2021   Procedure: GIVENS CAPSULE STUDY;  Surgeon: Unk Corinn Skiff, MD;  Location: [facility] ENDOSCOPY;  Service:  Gastroenterology;  Laterality: N/A;   HEMORRHOID SURGERY     LAPAROSCOPIC GASTRIC RESTRICTIVE DUODENAL PROCEDURE (DUODENAL SWITCH) Bilateral    2020   POLYPECTOMY  06/05/2023   Procedure: POLYPECTOMY, INTESTINE;  Surgeon: Maryruth Ole DASEN, MD;  Location: [facility] ENDOSCOPY;  Service: Endoscopy;;   OB History     Gravida  4   Para  1   Term  1   Preterm      AB  3   Living  1      SAB      IAB  2   Ectopic  1   Multiple      Live Births  1          Allergies  Allergen Reactions   Bee Pollen Itching   Celecoxib Hives   Hydrocodone  Nausea Only   Pollen Extract Itching   Sodium Ferric Gluconate [Ferrous Gluconate]     IV ferric gluconate - shortly after the infusion developed back pain shooting into left arm and bilateral hand swelling   Nasacort  [Triamcinolone ] Other (See Comments)    Nasal - Nose Bleeds   Vicodin [Hydrocodone -Acetaminophen ] Nausea Only    ROS: See HPi  Objective  BP 116/66   Pulse 62   Ht 5' 1 (1.549 m)   Wt 172 lb (78 kg)   BMI 32.50 kg/m   Physical examination   Breasts:   Symmetric, no masses, lesions or discharge, no axillary lymphadenopathy, nipples without lesions    Assessment -Breast pain  Plan -Discussed comfort measures including NSAIDs, ice, evening primrose oil, supportive bra. Avoid heavy lifting. -Diagnostic mammogram ordered   Eleanor Canny, CNM

## 2023-07-24 DIAGNOSIS — M542 Cervicalgia: Secondary | ICD-10-CM | POA: Diagnosis not present

## 2023-07-24 DIAGNOSIS — G8929 Other chronic pain: Secondary | ICD-10-CM | POA: Diagnosis not present

## 2023-07-24 DIAGNOSIS — M5412 Radiculopathy, cervical region: Secondary | ICD-10-CM | POA: Diagnosis not present

## 2023-07-24 DIAGNOSIS — S46811A Strain of other muscles, fascia and tendons at shoulder and upper arm level, right arm, initial encounter: Secondary | ICD-10-CM | POA: Diagnosis not present

## 2023-07-24 DIAGNOSIS — M47812 Spondylosis without myelopathy or radiculopathy, cervical region: Secondary | ICD-10-CM | POA: Diagnosis not present

## 2023-07-25 DIAGNOSIS — I10 Essential (primary) hypertension: Secondary | ICD-10-CM | POA: Diagnosis not present

## 2023-07-26 ENCOUNTER — Inpatient Hospital Stay
Admission: RE | Admit: 2023-07-26 | Discharge: 2023-07-26 | Discharge status: Home / Self Care | Source: Ambulatory Visit | Attending: Obstetrics

## 2023-07-26 ENCOUNTER — Ambulatory Visit
Admission: RE | Admit: 2023-07-26 | Discharge: 2023-07-26 | Disposition: A | Discharge status: Home / Self Care | Source: Ambulatory Visit | Attending: Obstetrics | Admitting: Obstetrics

## 2023-07-26 DIAGNOSIS — N644 Mastodynia: Secondary | ICD-10-CM | POA: Diagnosis present

## 2023-07-26 DIAGNOSIS — R92323 Mammographic fibroglandular density, bilateral breasts: Secondary | ICD-10-CM | POA: Diagnosis not present

## 2023-07-31 ENCOUNTER — Encounter: Payer: Self-pay | Admitting: Family

## 2023-07-31 ENCOUNTER — Ambulatory Visit (INDEPENDENT_AMBULATORY_CARE_PROVIDER_SITE_OTHER): Admitting: Family

## 2023-07-31 VITALS — BP 130/70 | HR 75 | Temp 98.7°F | Ht 60.0 in | Wt 176.4 lb

## 2023-07-31 DIAGNOSIS — J309 Allergic rhinitis, unspecified: Secondary | ICD-10-CM | POA: Diagnosis not present

## 2023-07-31 DIAGNOSIS — E669 Obesity, unspecified: Secondary | ICD-10-CM

## 2023-07-31 DIAGNOSIS — K5900 Constipation, unspecified: Secondary | ICD-10-CM

## 2023-07-31 DIAGNOSIS — D509 Iron deficiency anemia, unspecified: Secondary | ICD-10-CM

## 2023-07-31 DIAGNOSIS — K439 Ventral hernia without obstruction or gangrene: Secondary | ICD-10-CM | POA: Diagnosis not present

## 2023-07-31 MED ORDER — CETIRIZINE HCL 10 MG PO TABS: 10.0000 mg | ORAL_TABLET | Freq: Every day | ORAL | 3 refills | Status: DC

## 2023-07-31 NOTE — Patient Instructions (Addendum)
 Start miralax  daily or every other day indefinitely.   Start  zyrtec  for allergies  You have a large ventral hernia. We can continue to monitor. Certainly if you have pain or if not reducible as discussed, please let me know so I can arrange image and surgery consult.   Nice to see you.

## 2023-07-31 NOTE — Assessment & Plan Note (Signed)
 Ventral hernia. We discussed focusing on weight loss ; we jointly agreed to defer surgical intervention at this time. Alarm symptoms discussed.

## 2023-07-31 NOTE — Progress Notes (Signed)
 Assessment & Plan:  Allergic rhinitis, unspecified seasonality, unspecified trigger Assessment & Plan: Chronic, suboptimal control. She is not taking zyrtec  and I advised to resume so. Continue  fluticasone  , ryaltris and follow up with ENT  Orders: -     Cetirizine  HCl; Take 1 tablet (10 mg total) by mouth daily.  Dispense: 30 tablet; Refill: 3  Obesity, unspecified class, unspecified obesity type, unspecified whether serious comorbidity present -     Amb ref to Medical Nutrition Therapy-MNT  Ventral hernia without obstruction or gangrene Assessment & Plan: Ventral hernia. We discussed focusing on weight loss ; we jointly agreed to defer surgical intervention at this time. Alarm symptoms discussed.    Chronic iron  deficiency anemia Assessment & Plan: Chronic, complicated by h/o gastric surgery. Prior h/o alcohol  use. Follow up with hematology this month. Reviewed GI work up including Dr Jennifer [name] ( 07/19/23 - pending results) ,  EGD ( hiatal hernia with normal esophagus) and colonoscopy ( int hemorrhoids , repeat in 3 years) 06/05/23 .  Will follow.    Constipation, unspecified constipation type Assessment & Plan: Chronic, suboptimal control. Advised miralax  daily to every other day indefinitely. If does not improve symptom, will test for celiac disease. Consider trial on linzess.  Close follow up.       Return precautions given.   Risks, benefits, and alternatives of the medications and treatment plan prescribed today were discussed, and patient expressed understanding.   Education regarding symptom management and diagnosis given to patient on AVS either electronically or printed.  Return in about 3 months (around 10/31/2023).  [name], FNP  Subjective:    Patient ID: Jennifer [name], female    DOB: [birth year], 73 y.o.   MRN: [id number]  CC: Jennifer [name] is a 73 y.o. female who presents today for an acute visit.    HPI: Complains of 'puffed up belly'  all the time. She sees a bulge when lay down. She reports h/o mesh for hernia repair with Dr Wolm .   She complains of bloating . No particular food trigger.   capsule study for IDA 07/19/23 she is unsure of results.     She is not drinking alcohol  ;She endorses eating in place of alcohol  use.   She takes omeprazole  20mg  prn Endorses constipation without straining.  Endorses excessive gas.  Uses miralax  1-2 days /week. She is not using colace.  Complaint with pepcid  20mg  every day  GI consult Dr Jennifer 05/29/23 for IDA Recommend miralax  daily.  S/p EGD ( hiatal hernia with normal esophagus) and colonoscopy ( int hemorrhoids , repeat in 3 years) 06/05/23 ; if negative per OV note, will do [name].    H/o gastric bypass  She also complains of clear nasal discharge. She is taking fluticasone  , ryaltris.  Following with ENT    Allergies: Bee pollen, Celecoxib, Hydrocodone , Pollen extract, Sodium ferric gluconate [ferrous gluconate], Nasacort  [triamcinolone ], and Vicodin [hydrocodone -acetaminophen ] Current Outpatient Medications on File Prior to Visit  Medication Sig Dispense Refill   acetaminophen  (TYLENOL ) 325 MG tablet Take 2 tablets (650 mg total) by mouth every 6 (six) hours as needed for mild pain (or Fever >/= 101).     acyclovir  (ZOVIRAX ) 400 MG tablet Take 1 tablet (400 mg total) by mouth 2 (two) times daily. 180 tablet 3   famotidine  (PEPCID ) 20 MG tablet TAKE 1 TABLET (20 MG TOTAL) BY MOUTH AT BEDTIME AS NEEDED FOR HEARTBURN OR INDIGESTION. 30 tablet 5   FLUoxetine (PROZAC) 20 MG capsule  Take 40 mg by mouth daily.     FLUoxetine (PROZAC) 40 MG capsule Take 40 mg by mouth daily.     fluticasone  (FLONASE ) 50 MCG/ACT nasal spray INSTILL 1 SPRAY INTO BOTH NOSTRILS DAILY 48 mL 0   folic acid  (FOLVITE ) 1 MG tablet Take 1 tablet (1 mg total) by mouth daily. 90 tablet 3   furosemide (LASIX) 20 MG tablet Take 40 mg by mouth daily.     gabapentin  (NEURONTIN ) 100 MG capsule Take 100mg  PO  qam and 100mg  evening. 180 capsule 3   gabapentin  (NEURONTIN ) 300 MG capsule Take 900mg  qam, 300mg  at noon, and 900mg  at bedtime. 150 capsule 5   hydrocortisone  (ANUSOL -HC) 25 MG suppository Unwrap and insert 1 suppository rectally twice a day 12 suppository 1   levothyroxine  (SYNTHROID ) 50 MCG tablet Take 1 tablet (50 mcg total) by mouth daily. 90 tablet 3   losartan  (COZAAR ) 100 MG tablet TAKE 1 TABLET (100 MG TOTAL) BY MOUTH DAILY. TAKE 1 TABLET BY MOUTH EVERYDAY AT BEDTIME 90 tablet 1   Multiple Vitamin (MULTIVITAMIN WITH MINERALS) TABS tablet Take 1 tablet by mouth daily.     naltrexone (DEPADE) 50 MG tablet Take by mouth daily. Patient takes  one  50 mg tablet daily at the same time     potassium chloride  SA (KLOR-CON  M) 20 MEQ tablet Take 1 tablet (20 mEq total) by mouth daily.     rosuvastatin  (CRESTOR ) 5 MG tablet Take 2.5 mg by mouth daily.     RYALTRIS 665-25 MCG/ACT SUSP Place 1 spray into the nose at bedtime. 1 spray each nostril     traZODone  (DESYREL ) 50 MG tablet TAKE 1 TABLET BY MOUTH EVERY DAY FOR SLEEP 90 tablet 4   triamterene -hydrochlorothiazide  (MAXZIDE -25) 37.5-25 MG tablet Take 0.5  to 1 tablet daily prn for BP > 140/80. 90 tablet 3   sodium chloride  (OCEAN) 0.65 % SOLN nasal spray Place 2 sprays into both nostrils as needed for congestion. 30 mL 2   No current facility-administered medications on file prior to visit.    Review of Systems  Constitutional:  Negative for chills and fever.  Respiratory:  Negative for cough.   Cardiovascular:  Negative for chest pain and palpitations.  Gastrointestinal:  Positive for abdominal distention and constipation. Negative for nausea and vomiting.      Objective:    BP 130/70   Pulse 75   Temp 98.7 F (37.1 C) (Oral)   Ht 5' (1.524 m)   Wt 176 lb 6.4 oz (80 kg)   SpO2 98%   BMI 34.45 kg/m   BP Readings from Last 3 Encounters:  07/31/23 130/70  07/20/23 116/66  07/11/23 132/62   Wt Readings from Last 3 Encounters:   08/02/23 176 lb 6.4 oz (80 kg)  07/31/23 176 lb 6.4 oz (80 kg)  07/20/23 172 lb (78 kg)    Physical Exam Vitals reviewed.  Constitutional:      Appearance: Normal appearance. She is well-developed.  HENT:     Head: Normocephalic and atraumatic.     Right Ear: Hearing, tympanic membrane, ear canal and external ear normal. No decreased hearing noted. No drainage, swelling or tenderness. No middle ear effusion. No foreign body. Tympanic membrane is not erythematous or bulging.     Left Ear: Hearing, tympanic membrane, ear canal and external ear normal. No decreased hearing noted. No drainage, swelling or tenderness.  No middle ear effusion. No foreign body. Tympanic membrane is not erythematous or bulging.  Nose: Nose normal. No rhinorrhea.     Right Sinus: No maxillary sinus tenderness or frontal sinus tenderness.     Left Sinus: No maxillary sinus tenderness or frontal sinus tenderness.     Mouth/Throat:     Pharynx: Uvula midline. No oropharyngeal exudate or posterior oropharyngeal erythema.     Tonsils: No tonsillar abscesses.  Eyes:     Conjunctiva/sclera: Conjunctivae normal.  Cardiovascular:     Rate and Rhythm: Normal rate and regular rhythm.     Pulses: Normal pulses.     Heart sounds: Normal heart sounds.  Pulmonary:     Effort: Pulmonary effort is normal.     Breath sounds: Normal breath sounds. No wheezing, rhonchi or rales.  Abdominal:     General: Bowel sounds are normal. There is no distension.     Palpations: Abdomen is soft. Abdomen is not rigid. There is no fluid wave, mass or pulsatile mass.     Tenderness: There is no abdominal tenderness. There is no guarding or rebound.     Hernia: A hernia is present. Hernia is present in the ventral area.      Comments: When moving from upright to supine, ventral hernia seen. It is reducible, nontender.  Abdomen is soft  Lymphadenopathy:     Head:     Right side of head: No submental, submandibular, tonsillar,  preauricular, posterior auricular or occipital adenopathy.     Left side of head: No submental, submandibular, tonsillar, preauricular, posterior auricular or occipital adenopathy.     Cervical: No cervical adenopathy.  Skin:    General: Skin is warm and dry.  Neurological:     Mental Status: She is alert.  Psychiatric:        Speech: Speech normal.        Behavior: Behavior normal.        Thought Content: Thought content normal.

## 2023-08-02 ENCOUNTER — Ambulatory Visit (INDEPENDENT_AMBULATORY_CARE_PROVIDER_SITE_OTHER): Admitting: Podiatry

## 2023-08-02 VITALS — Ht 61.0 in | Wt 176.4 lb

## 2023-08-02 DIAGNOSIS — L600 Ingrowing nail: Secondary | ICD-10-CM | POA: Diagnosis not present

## 2023-08-02 NOTE — Progress Notes (Signed)
  Subjective:  Patient ID: Jennifer [name], female    DOB: 03-08-1950,  MRN: [id number]  Chief Complaint  Patient presents with   Nail Problem    Rm 1 Patient is here as a f/u on an ingrown toe nail removal of the left hallux. Left hallux nail bed is healing well, no redness,swelling and patient states no pain at removal site.    73 y.o. female presents with the above complaint. History confirmed with patient.  Much better  Objective:  Physical Exam: warm, good capillary refill, normal DP and PT pulses, normal sensory exam, and no recurrence of ingrown nail or blood blister has healed well Assessment:   1. Ingrowing left great toenail      Plan:  Patient was evaluated and treated and all questions answered.  Nail plates removed has alleviated their issue.  We discussed the regrows that permanent partial matricectomy repeated will be beneficial she will follow me as needed if this happens  No follow-ups on file.

## 2023-08-04 DIAGNOSIS — D500 Iron deficiency anemia secondary to blood loss (chronic): Secondary | ICD-10-CM | POA: Diagnosis not present

## 2023-08-06 DIAGNOSIS — K5900 Constipation, unspecified: Secondary | ICD-10-CM | POA: Insufficient documentation

## 2023-08-06 NOTE — Assessment & Plan Note (Addendum)
 Chronic, suboptimal control. Advised miralax  daily to every other day indefinitely. If does not improve symptom, will test for celiac disease. Consider trial on linzess.  Close follow up.

## 2023-08-06 NOTE — Assessment & Plan Note (Signed)
 Chronic, suboptimal control. She is not taking zyrtec  and I advised to resume so. Continue  fluticasone  , ryaltris and follow up with ENT

## 2023-08-06 NOTE — Assessment & Plan Note (Signed)
 Chronic, complicated by h/o gastric surgery. Prior h/o alcohol  use. Follow up with hematology this month. Reviewed GI work up including Dr Maryruth VCE ( 07/19/23 - pending results) ,  EGD ( hiatal hernia with normal esophagus) and colonoscopy ( int hemorrhoids , repeat in 3 years) 06/05/23 .  Will follow.

## 2023-08-13 ENCOUNTER — Other Ambulatory Visit: Payer: Self-pay | Admitting: Family

## 2023-08-13 DIAGNOSIS — K649 Unspecified hemorrhoids: Secondary | ICD-10-CM

## 2023-08-14 ENCOUNTER — Inpatient Hospital Stay: Attending: Oncology

## 2023-08-14 DIAGNOSIS — D500 Iron deficiency anemia secondary to blood loss (chronic): Secondary | ICD-10-CM

## 2023-08-14 DIAGNOSIS — Z9884 Bariatric surgery status: Secondary | ICD-10-CM | POA: Insufficient documentation

## 2023-08-14 DIAGNOSIS — D509 Iron deficiency anemia, unspecified: Secondary | ICD-10-CM | POA: Diagnosis not present

## 2023-08-14 LAB — CBC WITH DIFFERENTIAL (CANCER CENTER ONLY)
Abs Immature Granulocytes: 0.01 K/uL (ref 0.00–0.07)
Basophils Absolute: 0 K/uL (ref 0.0–0.1)
Basophils Relative: 0 %
Eosinophils Absolute: 0 K/uL (ref 0.0–0.5)
Eosinophils Relative: 1 %
HCT: 37.1 % (ref 36.0–46.0)
Hemoglobin: 11.6 g/dL — ABNORMAL LOW (ref 12.0–15.0)
Immature Granulocytes: 0 %
Lymphocytes Relative: 27 %
Lymphs Abs: 1.6 K/uL (ref 0.7–4.0)
MCH: 24.8 pg — ABNORMAL LOW (ref 26.0–34.0)
MCHC: 31.3 g/dL (ref 30.0–36.0)
MCV: 79.3 fL — ABNORMAL LOW (ref 80.0–100.0)
Monocytes Absolute: 0.7 K/uL (ref 0.1–1.0)
Monocytes Relative: 12 %
Neutro Abs: 3.5 K/uL (ref 1.7–7.7)
Neutrophils Relative %: 60 %
Platelet Count: 187 K/uL (ref 150–400)
RBC: 4.68 MIL/uL (ref 3.87–5.11)
RDW: 19.2 % — ABNORMAL HIGH (ref 11.5–15.5)
WBC Count: 5.8 K/uL (ref 4.0–10.5)
nRBC: 0 % (ref 0.0–0.2)

## 2023-08-14 LAB — VITAMIN B12: Vitamin B-12: 677 pg/mL (ref 180–914)

## 2023-08-14 LAB — IRON AND TIBC
Iron: 55 ug/dL (ref 28–170)
Saturation Ratios: 11 % (ref 10.4–31.8)
TIBC: 503 ug/dL — ABNORMAL HIGH (ref 250–450)
UIBC: 448 ug/dL

## 2023-08-14 LAB — FERRITIN: Ferritin: 9 ng/mL — ABNORMAL LOW (ref 11–307)

## 2023-08-15 DIAGNOSIS — G629 Polyneuropathy, unspecified: Secondary | ICD-10-CM | POA: Diagnosis not present

## 2023-08-15 DIAGNOSIS — Z1331 Encounter for screening for depression: Secondary | ICD-10-CM | POA: Diagnosis not present

## 2023-08-17 ENCOUNTER — Inpatient Hospital Stay

## 2023-08-17 ENCOUNTER — Inpatient Hospital Stay (HOSPITAL_BASED_OUTPATIENT_CLINIC_OR_DEPARTMENT_OTHER): Admitting: Oncology

## 2023-08-17 ENCOUNTER — Encounter: Payer: Self-pay | Admitting: Oncology

## 2023-08-17 VITALS — BP 158/68 | HR 43

## 2023-08-17 VITALS — BP 154/69 | HR 45 | Temp 98.8°F | Resp 18 | Wt 175.1 lb

## 2023-08-17 DIAGNOSIS — D509 Iron deficiency anemia, unspecified: Secondary | ICD-10-CM | POA: Diagnosis not present

## 2023-08-17 DIAGNOSIS — Z9884 Bariatric surgery status: Secondary | ICD-10-CM | POA: Diagnosis not present

## 2023-08-17 DIAGNOSIS — D500 Iron deficiency anemia secondary to blood loss (chronic): Secondary | ICD-10-CM

## 2023-08-17 DIAGNOSIS — D62 Acute posthemorrhagic anemia: Secondary | ICD-10-CM

## 2023-08-17 MED ORDER — SODIUM CHLORIDE 0.9% FLUSH
10.0000 mL | Freq: Once | INTRAVENOUS | Status: AC | PRN
Start: 2023-08-17 — End: 2023-08-17
  Administered 2023-08-17: 10 mL
  Filled 2023-08-17: qty 10

## 2023-08-17 MED ORDER — IRON SUCROSE 20 MG/ML IV SOLN
200.0000 mg | Freq: Once | INTRAVENOUS | Status: AC
  Administered 2023-08-17: 200 mg via INTRAVENOUS
  Filled 2023-08-17: qty 10

## 2023-08-17 NOTE — Progress Notes (Signed)
 Patient states she has some concerns about her test results.

## 2023-08-17 NOTE — Assessment & Plan Note (Signed)
Continue bariatric multivitamin.  ?

## 2023-08-17 NOTE — Progress Notes (Signed)
 Hematology/Oncology Progress note [phone] [phone]         Patient Care Team: Dineen Rollene MATSU, FNP as PCP - General (Family Medicine) Wonda Cy BROCKS, RD as Dietitian (Family Medicine) Babara Call, MD as Consulting Physician (Oncology) Isaiah Scrivener, MD as Consulting Physician (Pulmonary Disease) Hope Almarie ORN, NP as Nurse Practitioner (Pulmonary Disease) Pa, [facility] (Optometry)  ASSESSMENT & PLAN:   IDA (iron  deficiency anemia) #Iron  deficiency anemia due to chronic blood loss. Labs reviewed and discussed with patient. Lab Results  Component Value Date   HGB 11.6 (L) 08/14/2023   TIBC 503 (H) 08/14/2023   IRONPCTSAT 11 08/14/2023   FERRITIN 9 (L) 08/14/2023   Recommend Venofer  weekly x 4    History of bariatric surgery Continue bariatric multivitamin  Recommend patient to follow up with GI for evaluation of IDA Orders Placed This Encounter  Procedures   CBC with Differential (Cancer Center Only)    Standing Status:   Future    Expected Date:   12/17/2023    Expiration Date:   03/16/2024   Iron  and TIBC    Standing Status:   Future    Expected Date:   12/17/2023    Expiration Date:   03/16/2024   Ferritin    Standing Status:   Future    Expected Date:   12/17/2023    Expiration Date:   03/16/2024   Follow up in 4 months All questions were answered. The patient knows to call the clinic with any problems, questions or concerns.  Call Babara, MD, PhD [facility] Hematology Oncology 08/17/2023   CHIEF COMPLAINTS/REASON FOR VISIT:  Follow-up for iron  deficiency anemia.  HISTORY OF PRESENTING ILLNESS:   Jennifer [name] is a  73 y.o.  female with PMH listed below was seen in consultation at the request of  Dineen Rollene MATSU, FNP  for evaluation of iron  deficiency anemia  Patient was admitted from 02/05/2021 - 02/06/2021 due to symptomatic anemia with bright red blood per rectum.  Initial hemoglobin at presentation was 5.9.   Patient was transfused with 2 units of PRBC.  Also received 1 dose of IV Venofer  treatments.  Patient had a flexible sigmoidoscopy by Dr. Onita.  Findings of nonbleeding hemorrhoids.  Patient follows up with Dr. Therisa outpatient.  Patient was previously taking aspirin which has been discontinued.  Patient had hospitalization from 11/17/2020 - 11/19/2020 due to symptomatic anemia secondary to intermittent BPBPR/melena.  She received blood transfusion during that hospitalization.  She also had a reaction to ferrous gluconate. She had EGD and colonoscopy on 11/19/2020 with no notable findings of active bleeding.   Patient has a history of bariatric surgery.  05/03/2022 -05/05/2022 Admitted due to melena. Acute blood loss anemia. underwent CT angio GI bleed with no active GI hemorrhage but liver steatosis.  status post upper and lower endoscopy. Lower endoscopy showed internal hemorrhoids and upper endoscopy was within normal limits.   INTERVAL HISTORY Jennifer [name] is a 73 y.o. female who has above history reviewed by me today presents for follow up visit for iron  deficiency anemia.  Chronic fatigue, somewhat improved.  Denies hematochezia, hematuria, hematemesis, epistaxis, black tarry stool or easy bruising.    Review of Systems  Constitutional:  Positive for fatigue. Negative for appetite change, chills and fever.  HENT:   Negative for hearing loss and voice change.   Eyes:  Negative for eye problems.  Respiratory:  Negative for chest tightness and cough.   Cardiovascular:  Negative for  chest pain.  Gastrointestinal:  Negative for abdominal distention, abdominal pain and blood in stool.  Endocrine: Negative for hot flashes.  Genitourinary:  Negative for difficulty urinating and frequency.   Musculoskeletal:  Negative for arthralgias.  Skin:  Negative for itching and rash.  Neurological:  Negative for extremity weakness.  Hematological:  Negative for adenopathy.  Psychiatric/Behavioral:   Negative for confusion.     MEDICAL HISTORY:  Past Medical History:  Diagnosis Date   Anemia    Anxiety    Bariatric surgery status    Complication of anesthesia    Diificulty breathing for about 15 minutes after bariatric surgery   Constipation    COVID-19    Dysrhythmia    Elevated liver enzymes    GERD (gastroesophageal reflux disease)    Hemorrhoids    Herpes genitalis    High cholesterol    Hyperlipidemia    Hypertension    Hypothyroidism    IDA (iron  deficiency anemia) 02/09/2021   Neuropathy    Osteoarthritis    Sleep apnea    CPAP    SURGICAL HISTORY: Past Surgical History:  Procedure Laterality Date   ABDOMINAL HYSTERECTOMY     total for fibroids no h/o abnormal pap   bariatric sleeve  2015   BREAST EXCISIONAL BIOPSY Left 1998   carpal tunnel repair     CATARACT EXTRACTION W/PHACO Right 11/01/2021   Procedure: CATARACT EXTRACTION PHACO AND INTRAOCULAR LENS PLACEMENT (IOC) RIGHT;  Surgeon: Myrna Adine Anes, MD;  Location: [facility];  Service: Ophthalmology;  Laterality: Right;  sleep apnea 4.95 00:49.7   CATARACT EXTRACTION W/PHACO Left 11/15/2021   Procedure: CATARACT EXTRACTION PHACO AND INTRAOCULAR LENS PLACEMENT (IOC) LEFT 4.94 00:32.3;  Surgeon: Myrna Adine Anes, MD;  Location: [facility];  Service: Ophthalmology;  Laterality: Left;  sleep apnea   COLONOSCOPY N/A 06/05/2023   Procedure: COLONOSCOPY;  Surgeon: Maryruth Ole DASEN, MD;  Location: [facility] END[facility];  Service: Endoscopy;  Laterality: N/A;   COLONOSCOPY WITH PROPOFOL  N/A 02/11/2016   Procedure: COLONOSCOPY WITH PROPOFOL ;  Surgeon: Ruel Kung, MD;  Location: [facility] ENDOSCOPY;  Service: Endoscopy;  Laterality: N/A;   COLONOSCOPY WITH PROPOFOL  N/A 10/01/2019   Procedure: COLONOSCOPY WITH PROPOFOL ;  Surgeon: Kung Ruel, MD;  Location: [facility];  Service: Gastroenterology;  Laterality: N/A;   COLONOSCOPY WITH PROPOFOL  N/A 11/19/2020   Procedure: COLONOSCOPY WITH PROPOFOL ;   Surgeon: Kung Ruel, MD;  Location: [facility] ENDOSCOPY;  Service: Gastroenterology;  Laterality: N/A;   COLONOSCOPY WITH PROPOFOL  N/A 05/05/2022   Procedure: COLONOSCOPY WITH PROPOFOL ;  Surgeon: Maryruth Ole DASEN, MD;  Location: [facility] ENDOSCOPY;  Service: Endoscopy;  Laterality: N/A;   ESOPHAGOGASTRODUODENOSCOPY N/A 06/05/2023   Procedure: EGD (ESOPHAGOGASTRODUODENOSCOPY);  Surgeon: Maryruth Ole DASEN, MD;  Location: [facility] ENDOSCOPY;  Service: Endoscopy;  Laterality: N/A;   ESOPHAGOGASTRODUODENOSCOPY (EGD) WITH PROPOFOL  N/A 12/02/2019   Procedure: ESOPHAGOGASTRODUODENOSCOPY (EGD) WITH PROPOFOL ;  Surgeon: Kung Ruel, MD;  Location: [facility] ENDOSCOPY;  Service: Gastroenterology;  Laterality: N/A;   ESOPHAGOGASTRODUODENOSCOPY (EGD) WITH PROPOFOL  N/A 11/19/2020   Procedure: ESOPHAGOGASTRODUODENOSCOPY (EGD) WITH PROPOFOL ;  Surgeon: Kung Ruel, MD;  Location: [facility] ENDOSCOPY;  Service: Gastroenterology;  Laterality: N/A;   ESOPHAGOGASTRODUODENOSCOPY (EGD) WITH PROPOFOL  N/A 05/05/2022   Procedure: ESOPHAGOGASTRODUODENOSCOPY (EGD) WITH PROPOFOL ;  Surgeon: Maryruth Ole DASEN, MD;  Location: [facility] ENDOSCOPY;  Service: Endoscopy;  Laterality: N/A;   FLEXIBLE SIGMOIDOSCOPY N/A 02/06/2021   Procedure: FLEXIBLE SIGMOIDOSCOPY;  Surgeon: Onita Elspeth Sharper, DO;  Location: [facility] ENDOSCOPY;  Service: Gastroenterology;  Laterality: N/A;   GIVENS CAPSULE STUDY  N/A 06/07/2021   Procedure: GIVENS CAPSULE STUDY;  Surgeon: Unk Corinn Skiff, MD;  Location: [facility] ENDOSCOPY;  Service: Gastroenterology;  Laterality: N/A;   GIVENS CAPSULE STUDY N/A 06/09/2021   Procedure: GIVENS CAPSULE STUDY;  Surgeon: Unk Corinn Skiff, MD;  Location: [facility] ENDOSCOPY;  Service: Gastroenterology;  Laterality: N/A;   HEMORRHOID SURGERY     LAPAROSCOPIC GASTRIC RESTRICTIVE DUODENAL PROCEDURE (DUODENAL SWITCH) Bilateral    2020   POLYPECTOMY  06/05/2023   Procedure: POLYPECTOMY, INTESTINE;  Surgeon: Maryruth Ole DASEN, MD;  Location: [facility]  ENDOSCOPY;  Service: Endoscopy;;    SOCIAL HISTORY: Social History   Socioeconomic History   Marital status: Married    Spouse name: Not on file   Number of children: Not on file   Years of education: Not on file   Highest education level: Some college, no degree  Occupational History   Not on file  Tobacco Use   Smoking status: Former    Current packs/day: 0.00    Average packs/day: 1.5 packs/day for 24.0 years (36.0 ttl pk-yrs)    Types: Cigarettes    Start date: 58    Quit date: 72    Years since quitting: 32.6   Smokeless tobacco: Never   Tobacco comments:    quit 1995.   Vaping Use   Vaping status: Never Used  Substance and Sexual Activity   Alcohol  use: Not Currently    Alcohol /week: 14.0 standard drinks of alcohol     Types: 14 Glasses of wine per week   Drug use: No   Sexual activity: Not Currently    Birth control/protection: Surgical    Comment: Hysterectomy  Other Topics Concern   Not on file  Social History Narrative   Lives in [place].    Married.    Retired 2015, Engineer, structural.    One son; granddaughter.    Left handed    Caffeine- decaf coffee.    Social Drivers of Corporate investment banker Strain: Low Risk  (08/15/2023)   Received from [facility]   Overall Financial Resource Strain (CARDIA)    Difficulty of Paying Living Expenses: Not hard at all  Food Insecurity: No Food Insecurity (08/15/2023)   Received from [facility]   Hunger Vital Sign    Within the past 12 months, you worried that your food would run out before you got the money to buy more.: Never true    Within the past 12 months, the food you bought just didn't last and you didn't have money to get more.: Never true  Transportation Needs: No Transportation Needs (08/15/2023)   Received from [facility] - Transportation    In the past 12 months, has lack of transportation kept you from medical appointments  or from getting medications?: No    Lack of Transportation (Non-Medical): No  Physical Activity: Insufficiently Active (04/17/2023)   Exercise Vital Sign    Days of Exercise per Week: 3 days    Minutes of Exercise per Session: 20 min  Stress: No Stress Concern Present (07/08/2023)   [employer] of Occupational Health - Occupational Stress Questionnaire    Feeling of Stress: Not at all  Social Connections: Unknown (07/08/2023)   Social Connection and Isolation Panel    Frequency of Communication with Friends and Family: Patient declined    Frequency of Social Gatherings with Friends and Family: Patient declined    Attends Religious Services: Patient declined  Active Member of Clubs or Organizations: No    Attends Banker Meetings: Not on file    Marital Status: Married  Recent Concern: Social Connections - Socially Isolated (04/17/2023)   Social Connection and Isolation Panel    Frequency of Communication with Friends and Family: Once a week    Frequency of Social Gatherings with Friends and Family: Once a week    Attends Religious Services: Never    Database administrator or Organizations: No    Attends Banker Meetings: Never    Marital Status: Married  Catering manager Violence: Not At Risk (04/17/2023)   Humiliation, Afraid, Rape, and Kick questionnaire    Fear of Current or Ex-Partner: No    Emotionally Abused: No    Physically Abused: No    Sexually Abused: No    FAMILY HISTORY: Family History  Problem Relation Age of Onset   Hypertension Mother    Heart disease Father    Alcohol  abuse Father    Breast cancer Sister 32       materal 1/2 sister   Hypertension Sister    Hypertension Brother     ALLERGIES:  is allergic to bee pollen, celecoxib, hydrocodone , pollen extract, sodium ferric gluconate [ferrous gluconate], nasacort  [triamcinolone ], and vicodin [hydrocodone -acetaminophen ].  MEDICATIONS:  Current Outpatient Medications   Medication Sig Dispense Refill   acetaminophen  (TYLENOL ) 325 MG tablet Take 2 tablets (650 mg total) by mouth every 6 (six) hours as needed for mild pain (or Fever >/= 101).     acyclovir  (ZOVIRAX ) 400 MG tablet Take 1 tablet (400 mg total) by mouth 2 (two) times daily. 180 tablet 3   cetirizine  (ZYRTEC ) 10 MG tablet Take 1 tablet (10 mg total) by mouth daily. 30 tablet 3   famotidine  (PEPCID ) 20 MG tablet TAKE 1 TABLET (20 MG TOTAL) BY MOUTH AT BEDTIME AS NEEDED FOR HEARTBURN OR INDIGESTION. 30 tablet 5   FLUoxetine (PROZAC) 20 MG capsule Take 40 mg by mouth daily.     FLUoxetine (PROZAC) 40 MG capsule Take 40 mg by mouth daily.     fluticasone  (FLONASE ) 50 MCG/ACT nasal spray INSTILL 1 SPRAY INTO BOTH NOSTRILS DAILY 48 mL 0   folic acid  (FOLVITE ) 1 MG tablet Take 1 tablet (1 mg total) by mouth daily. 90 tablet 3   furosemide (LASIX) 20 MG tablet Take 40 mg by mouth daily.     gabapentin  (NEURONTIN ) 100 MG capsule Take 100mg  PO qam and 100mg  evening. 180 capsule 3   gabapentin  (NEURONTIN ) 300 MG capsule Take 900mg  qam, 300mg  at noon, and 900mg  at bedtime. 150 capsule 5   hydrocortisone  (ANUSOL -HC) 25 MG suppository Unwrap and insert 1 suppository rectally twice a day prn 12 suppository 1   levothyroxine  (SYNTHROID ) 50 MCG tablet Take 1 tablet (50 mcg total) by mouth daily. 90 tablet 3   losartan  (COZAAR ) 100 MG tablet TAKE 1 TABLET (100 MG TOTAL) BY MOUTH DAILY. TAKE 1 TABLET BY MOUTH EVERYDAY AT BEDTIME 90 tablet 1   Multiple Vitamin (MULTIVITAMIN WITH MINERALS) TABS tablet Take 1 tablet by mouth daily.     naltrexone (DEPADE) 50 MG tablet Take by mouth daily. Patient takes  one  50 mg tablet daily at the same time     potassium chloride  SA (KLOR-CON  M) 20 MEQ tablet Take 1 tablet (20 mEq total) by mouth daily.     rosuvastatin  (CRESTOR ) 5 MG tablet Take 2.5 mg by mouth daily.     RYALTRIS 665-25 MCG/ACT  SUSP Place 1 spray into the nose at bedtime. 1 spray each nostril     sodium chloride   (OCEAN) 0.65 % SOLN nasal spray Place 2 sprays into both nostrils as needed for congestion. 30 mL 2   tiZANidine (ZANAFLEX) 2 MG tablet TAKE 1 TABLET (2MG  TOTAL) BY MOUTH TWICE A DAY AS NEEDED FOR MUSCLE SPASM     traZODone  (DESYREL ) 50 MG tablet TAKE 1 TABLET BY MOUTH EVERY DAY FOR SLEEP 90 tablet 4   triamterene -hydrochlorothiazide  (MAXZIDE -25) 37.5-25 MG tablet Take 0.5  to 1 tablet daily prn for BP > 140/80. 90 tablet 3   No current facility-administered medications for this visit.     PHYSICAL EXAMINATION: ECOG PERFORMANCE STATUS: 1 - Symptomatic but completely ambulatory Vitals:   08/17/23 1042  BP: (!) 154/69  Pulse: (!) 45  Resp: 18  Temp: 98.8 F (37.1 C)  SpO2: 99%   Filed Weights   08/17/23 1042  Weight: 175 lb 1.6 oz (79.4 kg)    Physical Exam Constitutional:      General: She is not in acute distress. HENT:     Head: Normocephalic and atraumatic.  Eyes:     General: No scleral icterus. Cardiovascular:     Rate and Rhythm: Normal rate.  Pulmonary:     Effort: Pulmonary effort is normal. No respiratory distress.  Abdominal:     General: There is no distension.  Musculoskeletal:        General: Normal range of motion.     Cervical back: Normal range of motion and neck supple.  Skin:    Findings: No erythema or rash.  Neurological:     Mental Status: She is alert and oriented to person, place, and time. Mental status is at baseline.  Psychiatric:        Mood and Affect: Mood normal.     LABORATORY DATA:  I have reviewed the data as listed    Latest Ref Rng & Units 08/14/2023   10:01 AM 05/05/2023   10:29 AM 12/19/2022   11:23 AM  CBC  WBC 4.0 - 10.5 K/uL 5.8  5.5  5.5   Hemoglobin 12.0 - 15.0 g/dL 88.3  8.6  88.4   Hematocrit 36.0 - 46.0 % 37.1  28.0  36.4   Platelets 150 - 400 K/uL 187  141  161       Latest Ref Rng & Units 12/19/2022   11:23 AM 11/25/2022    9:45 AM 09/07/2022    9:14 AM  CMP  Glucose 70 - 99 mg/dL 896  86  897   BUN 8 - 23  mg/dL 20  19  18    Creatinine 0.44 - 1.00 mg/dL 9.06  9.05  8.84   Sodium 135 - 145 mmol/L 138  134  133   Potassium 3.5 - 5.1 mmol/L 4.2  3.7  4.0   Chloride 98 - 111 mmol/L 98  97  99   CO2 22 - 32 mmol/L 30  31  26    Calcium  8.9 - 10.3 mg/dL 9.0  9.2  8.8   Total Protein 6.5 - 8.1 g/dL 7.2   6.9   Total Bilirubin <1.2 mg/dL 0.5   0.4   Alkaline Phos 38 - 126 U/L 105   102   AST 15 - 41 U/L 60   42   ALT 0 - 44 U/L 40   19     Lab Results  Component Value Date   IRON  55 08/14/2023  TIBC 503 (H) 08/14/2023   FERRITIN 9 (L) 08/14/2023     RADIOGRAPHIC STUDIES: I have personally reviewed the radiological images as listed and agreed with the findings in the report. MM 3D DIAGNOSTIC MAMMOGRAM BILATERAL BREAST Result Date: 07/26/2023 CLINICAL DATA:  RIGHT nipple pain and focal pain in the RIGHT breast EXAM: DIGITAL DIAGNOSTIC BILATERAL MAMMOGRAM WITH TOMOSYNTHESIS AND CAD; ULTRASOUND RIGHT BREAST LIMITED TECHNIQUE: Bilateral digital diagnostic mammography and breast tomosynthesis was performed. The images were evaluated with computer-aided detection. ; Targeted ultrasound examination of the right breast was performed COMPARISON:  Previous exam(s). ACR Breast Density Category b: There are scattered areas of fibroglandular density. FINDINGS: Spot compression tomosynthesis views were obtained over the area of focal pain in the RIGHT breast. No suspicious mammographic finding is identified in this area. No suspicious mass, microcalcification, or other finding is identified in either breast. On physical exam, no suspicious mass is appreciated. Targeted RIGHT breast ultrasound was performed in the area of pain at the upper inner breast. No suspicious solid or cystic mass is identified. Targeted RIGHT retroareolar breast ultrasound was performed of the additional site of painful concern. No suspicious cystic or solid mass is seen. IMPRESSION: 1. No mammographic or sonographic evidence of malignancy at  the sites of painful concern in the RIGHT breast. Any further workup of the patient's symptoms should be based on the clinical assessment. Recommend routine annual screening mammogram in 1 year. 2. No mammographic evidence of malignancy bilaterally. 3. Breast pain is a common condition, which will often resolve on its own without intervention. It can be affected by hormonal changes, medication side effect, weight changes and fit of the bra. Pain may also be referred from other adjacent areas of the body. Breast pain may be improved by wearing adequate well-fitting support, over-the-counter topical and oral NSAID medication, low-fat diet, and ice/heat as needed. Studies have shown an improvement in cyclic pain with use of evening primrose oil, vitamin D  and vitamin E. Clinical follow-up recommended to discuss any further work-up recommendations and appropriate treatment. RECOMMENDATION: Screening mammogram in one year.(Code:SM-B-01Y) I have discussed the findings and recommendations with the patient. If applicable, a reminder letter will be sent to the patient regarding the next appointment. BI-RADS CATEGORY  1: Negative. Electronically Signed   By: Corean Salter M.D.   On: 07/26/2023 14:21   US  LIMITED ULTRASOUND INCLUDING AXILLA RIGHT BREAST Result Date: 07/26/2023 CLINICAL DATA:  RIGHT nipple pain and focal pain in the RIGHT breast EXAM: DIGITAL DIAGNOSTIC BILATERAL MAMMOGRAM WITH TOMOSYNTHESIS AND CAD; ULTRASOUND RIGHT BREAST LIMITED TECHNIQUE: Bilateral digital diagnostic mammography and breast tomosynthesis was performed. The images were evaluated with computer-aided detection. ; Targeted ultrasound examination of the right breast was performed COMPARISON:  Previous exam(s). ACR Breast Density Category b: There are scattered areas of fibroglandular density. FINDINGS: Spot compression tomosynthesis views were obtained over the area of focal pain in the RIGHT breast. No suspicious mammographic finding is  identified in this area. No suspicious mass, microcalcification, or other finding is identified in either breast. On physical exam, no suspicious mass is appreciated. Targeted RIGHT breast ultrasound was performed in the area of pain at the upper inner breast. No suspicious solid or cystic mass is identified. Targeted RIGHT retroareolar breast ultrasound was performed of the additional site of painful concern. No suspicious cystic or solid mass is seen. IMPRESSION: 1. No mammographic or sonographic evidence of malignancy at the sites of painful concern in the RIGHT breast. Any further workup of the patient's symptoms  should be based on the clinical assessment. Recommend routine annual screening mammogram in 1 year. 2. No mammographic evidence of malignancy bilaterally. 3. Breast pain is a common condition, which will often resolve on its own without intervention. It can be affected by hormonal changes, medication side effect, weight changes and fit of the bra. Pain may also be referred from other adjacent areas of the body. Breast pain may be improved by wearing adequate well-fitting support, over-the-counter topical and oral NSAID medication, low-fat diet, and ice/heat as needed. Studies have shown an improvement in cyclic pain with use of evening primrose oil, vitamin D  and vitamin E. Clinical follow-up recommended to discuss any further work-up recommendations and appropriate treatment. RECOMMENDATION: Screening mammogram in one year.(Code:SM-B-01Y) I have discussed the findings and recommendations with the patient. If applicable, a reminder letter will be sent to the patient regarding the next appointment. BI-RADS CATEGORY  1: Negative. Electronically Signed   By: Corean Salter M.D.   On: 07/26/2023 14:21

## 2023-08-17 NOTE — Assessment & Plan Note (Signed)
#  Iron  deficiency anemia due to chronic blood loss. Labs reviewed and discussed with patient. Lab Results  Component Value Date   HGB 11.6 (L) 08/14/2023   TIBC 503 (H) 08/14/2023   IRONPCTSAT 11 08/14/2023   FERRITIN 9 (L) 08/14/2023   Recommend Venofer  weekly x 4

## 2023-08-24 ENCOUNTER — Inpatient Hospital Stay

## 2023-08-25 ENCOUNTER — Ambulatory Visit (INDEPENDENT_AMBULATORY_CARE_PROVIDER_SITE_OTHER): Admitting: Primary Care

## 2023-08-25 ENCOUNTER — Encounter: Payer: Self-pay | Admitting: Primary Care

## 2023-08-25 VITALS — BP 134/70 | HR 52 | Temp 97.5°F | Ht 61.0 in | Wt 172.4 lb

## 2023-08-25 DIAGNOSIS — G4733 Obstructive sleep apnea (adult) (pediatric): Secondary | ICD-10-CM | POA: Diagnosis not present

## 2023-08-25 NOTE — Progress Notes (Signed)
 @Patient  ID: Jennifer [name], female    DOB: [birth year], 73 y.o.   MRN: [id number]  Chief Complaint  Patient presents with   Obstructive Sleep Apnea    Pt complains of post nasal drip and issues with her mask. Pt would like to know if she can try pillows to see if this will help better.     Referring provider: Dineen Rollene MATSU, FNP  HPI: 73 year old female, former smoker quit 1993 (36-pack-year history).  Past medical history significant for OSA, allergic rhinitis, hypertension, GERD, diabetes mellitus, hypothyroidism, chronic pain syndrome.   Previous [facility] pulmonary encounter: 05/12/2021 Patient presents today ffor over OSA follow-up. She was last seen in 201 by pulmonary NP.  She has history of obstructive sleep apnea. Sleep study in January 2018 showed mild OSA, AHI 14/1/hr (REM supine sleep 55.5). She is currently on CPAP at 10cm h20. She is wearing her CPAP every night but only getting 2 hours of use. She reports experiencing condensation in her nasal mask with use. Humidity level is currently at 4. She feels she is also not getting enough pressure. No residual daytime sleepiness.   Airview download 04/10/2021 - 05/09/2021 Usage 30/30 days (100%); 3 days (10%) greater than 4 hours Average usage 2 hours 7 minutes Pressure 10 cm H2O Air leaks 9.9 L/min (95%) AHI 0.4   10/05/2022 Patient presents today for overdue sleep follow-up. Hx mild sleep apnea, AHI 14/hour. It has been awhile since she has worn her CPAP. She reports getting too much moisture/condensation in her mask. She has tried adjusting humidification settings, currently set at level 4. Using nasal mask. CPAP machine is working. Feels pressure is not strong enough.  Airview download 09/04/22-10/03/22 Usage day 5/30 days (17%)  Average usage days used 1 hours 11 min Pressure 10cm h20 Airleaks 11L/min  AHI 0   OSA (obstructive sleep apnea) - Hx mild-moderate OSA, AHI 14/hour. CPAP titrated to optimal pressure 10cm h20.  She has been struggling with toleration due to mask condensation. We adjusted humidification level from level 4 to a level 1. We will also change pressure settings to auto settings 10-12cm h20. Encourage patient aim to wear CPAP nightly 4-6 hours or longer. FU in 4-8 weeks or sooner if needed.    12/26/2022 Patient contacted today for virtual video visit. Patient has mild OSA, AHI 14/hour. CPAP titration study showed optimal pressure 10cm h20. During our last visit we adjusted humidification level from 4 down to a level 1. We also changed pressure settings to auto settings 10-12cm h20.   Feels like the pressure is not strong enough at times, especially when she first going to sleep at night. Her current RAMP time is set at 40, we have asked her to turn RAMP off.   Airview download 11/23/2022 - 12/22/2022 Usage days 29/30 days (97%): 15 days (50%) greater than 4 hours Average usage days used 3 hours 39 minutes Pressure 10 to 12 cm H2O (10.1 cm H2O-95%) Air leaks 21.9 L/min (95%) AHI 0.5  Mild-moderate OSA: Patient is more compliant with CPAP, feels pressure is not always strong enough especially when first going to sleep at night. Current pressure 10-12cm h2O; Residual AHI 0.5/hour. Ramp time is set at 40 mins, advised she try turning RAMP time off/ or changing to auto. No other changes needed at this time. Advised she continue to aim to wear CPAP 4+ more hours per night.  Patient called our office back and would like pressure increased to either 13-14cm h20.  Order has been placed auto settings 10-14cm h20.    08/25/2023- Interim hx  Discussed the use of AI scribe software for clinical note transcription with the patient, who gave verbal consent to proceed.  History of Present Illness Jennifer [name] is a 73 year old female with sleep apnea who presents with issues related to her CPAP mask.  She experiences postnasal drip and sinus pressure, primarily at night, which she attributes to her  CPAP mask usage. The sensation is described as 'post-nasal drip in the back of my throat and in my nose area.' This has led to condensation collecting in the mask, requiring frequent use of tissues to wipe it. Symptoms are more pronounced at night but occur during the day as well.  She has been using a nasal mask for her CPAP therapy but has experienced a decrease in usage since mid-June due to these issues. Her usage was consistent in May and early June but became sporadic through July. She is interested in trying a nasal pillow mask to alleviate the drainage issues.  She is currently using Flonase  for her symptoms, which was prescribed by her ear, nose, and throat specialist. She is scheduled to see this specialist on Tuesday to discuss her ongoing symptoms and potential adjustments to her treatment plan.  She inquires about the Inspire device after seeing a commercial, expressing interest in learning more about it as a potential treatment for her sleep apnea but after review she is no longer interested.   Airview download 05/24/23-08/21/23 Usage 56/90 days (62%) Average usage days used 1 hours 48 mins Pressure 10-14cm h20 Airleaks 9.8L/min AHI 0.7    Allergies  Allergen Reactions   Bee Pollen Itching   Celecoxib Hives   Hydrocodone  Nausea Only   Pollen Extract Itching   Sodium Ferric Gluconate [Ferrous Gluconate]     IV ferric gluconate - shortly after the infusion developed back pain shooting into left arm and bilateral hand swelling   Nasacort  Arcadius.Barth ] Other (See Comments)    Nasal - Nose Bleeds   Vicodin [Hydrocodone -Acetaminophen ] Nausea Only    Immunization History  Administered Date(s) Administered   Fluad Quad(high Dose 65+) 10/02/2020, 10/05/2021   Fluad Trivalent(High Dose 65+) 09/22/2022   Hep A / Hep B 05/24/2013, 11/21/2013   Influenza Split 10/21/2013   Influenza, High Dose Seasonal PF 10/24/2016, 09/06/2018   Influenza,inj,Quad PF,6+ Mos 11/08/2013, 10/28/2014    Influenza-Unspecified 12/05/2011, 10/28/2015, 10/24/2016, 09/06/2017, 10/18/2019   PFIZER(Purple Top)SARS-COV-2 Vaccination 03/15/2019, 04/09/2019, 04/09/2019, 11/01/2019, 08/07/2020   Pfizer Covid-19 Vaccine Bivalent Booster 58yrs & up 07/15/2021   Pfizer(Comirnaty)Fall Seasonal Vaccine 12 years and older 10/29/2021, 11/14/2022   Pneumococcal Conjugate-13 12/05/2014, 04/06/2016   Pneumococcal Polysaccharide-23 06/05/2017   Tdap 02/05/2014, 08/07/2018   Zoster Recombinant(Shingrix) 12/25/2018, 10/07/2021   Zoster, Live 01/09/2012, 12/05/2014    Past Medical History:  Diagnosis Date   Anemia    Anxiety    Bariatric surgery status    Complication of anesthesia    Diificulty breathing for about 15 minutes after bariatric surgery   Constipation    COVID-19    Dysrhythmia    Elevated liver enzymes    GERD (gastroesophageal reflux disease)    Hemorrhoids    Herpes genitalis    High cholesterol    Hyperlipidemia    Hypertension    Hypothyroidism    IDA (iron  deficiency anemia) 02/09/2021   Neuropathy    Osteoarthritis    Sleep apnea    CPAP    Tobacco History: Social History  Tobacco Use  Smoking Status Former   Current packs/day: 0.00   Average packs/day: 1.5 packs/day for 24.0 years (36.0 ttl pk-yrs)   Types: Cigarettes   Start date: 36   Quit date: 104   Years since quitting: 32.6  Smokeless Tobacco Never  Tobacco Comments   quit 1995.    Counseling given: Not Answered Tobacco comments: quit 1995.    Outpatient Medications Prior to Visit  Medication Sig Dispense Refill   acetaminophen  (TYLENOL ) 325 MG tablet Take 2 tablets (650 mg total) by mouth every 6 (six) hours as needed for mild pain (or Fever >/= 101).     acyclovir  (ZOVIRAX ) 400 MG tablet Take 1 tablet (400 mg total) by mouth 2 (two) times daily. 180 tablet 3   cetirizine  (ZYRTEC ) 10 MG tablet Take 1 tablet (10 mg total) by mouth daily. 30 tablet 3   famotidine  (PEPCID ) 20 MG tablet TAKE 1 TABLET  (20 MG TOTAL) BY MOUTH AT BEDTIME AS NEEDED FOR HEARTBURN OR INDIGESTION. 30 tablet 5   FLUoxetine (PROZAC) 40 MG capsule Take 40 mg by mouth daily.     fluticasone  (FLONASE ) 50 MCG/ACT nasal spray INSTILL 1 SPRAY INTO BOTH NOSTRILS DAILY 48 mL 0   folic acid  (FOLVITE ) 1 MG tablet Take 1 tablet (1 mg total) by mouth daily. 90 tablet 3   furosemide (LASIX) 20 MG tablet Take 40 mg by mouth daily.     gabapentin  (NEURONTIN ) 300 MG capsule Take 900mg  qam, 300mg  at noon, and 900mg  at bedtime. 150 capsule 5   GAVILYTE-G 236 g solution Take 4,000 mLs by mouth as directed.     hydrocortisone  (ANUSOL -HC) 25 MG suppository Unwrap and insert 1 suppository rectally twice a day prn 12 suppository 1   levothyroxine  (SYNTHROID ) 50 MCG tablet Take 1 tablet (50 mcg total) by mouth daily. 90 tablet 3   losartan  (COZAAR ) 100 MG tablet TAKE 1 TABLET (100 MG TOTAL) BY MOUTH DAILY. TAKE 1 TABLET BY MOUTH EVERYDAY AT BEDTIME 90 tablet 1   Multiple Vitamin (MULTIVITAMIN WITH MINERALS) TABS tablet Take 1 tablet by mouth daily.     Na Sulfate-K Sulfate-Mg Sulfate concentrate (SUPREP) 17.5-3.13-1.6 GM/177ML SOLN Take 354 mLs by mouth as directed.     naltrexone (DEPADE) 50 MG tablet Take by mouth daily. Patient takes  one  50 mg tablet daily at the same time     potassium chloride  SA (KLOR-CON  M) 20 MEQ tablet Take 1 tablet (20 mEq total) by mouth daily.     rosuvastatin  (CRESTOR ) 5 MG tablet Take 2.5 mg by mouth daily.     RYALTRIS 665-25 MCG/ACT SUSP Place 1 spray into the nose at bedtime. 1 spray each nostril     sodium chloride  (OCEAN) 0.65 % SOLN nasal spray Place 2 sprays into both nostrils as needed for congestion. 30 mL 2   traZODone  (DESYREL ) 50 MG tablet TAKE 1 TABLET BY MOUTH EVERY DAY FOR SLEEP 90 tablet 4   triamterene -hydrochlorothiazide  (MAXZIDE -25) 37.5-25 MG tablet Take 0.5  to 1 tablet daily prn for BP > 140/80. 90 tablet 3   FLUoxetine (PROZAC) 20 MG capsule Take 40 mg by mouth daily. (Patient not taking:  Reported on 08/25/2023)     gabapentin  (NEURONTIN ) 100 MG capsule Take 100mg  PO qam and 100mg  evening. (Patient not taking: Reported on 08/25/2023) 180 capsule 3   tiZANidine (ZANAFLEX) 2 MG tablet TAKE 1 TABLET (2MG  TOTAL) BY MOUTH TWICE A DAY AS NEEDED FOR MUSCLE SPASM (Patient not taking: Reported on  08/25/2023)     No facility-administered medications prior to visit.    Review of Systems  Review of Systems  Constitutional: Negative.   HENT:  Positive for postnasal drip.     Physical Exam  BP 134/70   Pulse (!) 52   Temp (!) 97.5 F (36.4 C)   Ht 5' 1 (1.549 m)   Wt 172 lb 6.4 oz (78.2 kg)   SpO2 97% Comment: RA  BMI 32.57 kg/m  Physical Exam Constitutional:      Appearance: Normal appearance. She is well-developed.  HENT:     Head: Normocephalic and atraumatic.     Mouth/Throat:     Mouth: Mucous membranes are moist.     Pharynx: Oropharynx is clear.  Eyes:     Pupils: Pupils are equal, round, and reactive to light.  Cardiovascular:     Rate and Rhythm: Normal rate and regular rhythm.     Heart sounds: Normal heart sounds. No murmur heard. Pulmonary:     Effort: Pulmonary effort is normal. No respiratory distress.     Breath sounds: Normal breath sounds. No wheezing or rhonchi.  Abdominal:     General: Bowel sounds are normal.     Palpations: Abdomen is soft.     Tenderness: There is no abdominal tenderness.  Musculoskeletal:        General: Normal range of motion.     Cervical back: Normal range of motion and neck supple.  Skin:    General: Skin is warm and dry.     Findings: No erythema or rash.  Neurological:     General: No focal deficit present.     Mental Status: She is alert and oriented to person, place, and time. Mental status is at baseline.  Psychiatric:        Mood and Affect: Mood normal.        Behavior: Behavior normal.        Thought Content: Thought content normal.        Judgment: Judgment normal.     Lab Results:  CBC    Component  Value Date/Time   WBC 5.8 08/14/2023 1001   WBC 5.2 07/06/2022 0845   RBC 4.68 08/14/2023 1001   HGB 11.6 (L) 08/14/2023 1001   HGB 9.9 (L) 06/24/2021 1330   HCT 37.1 08/14/2023 1001   HCT 31.9 (L) 06/24/2021 1330   PLT 187 08/14/2023 1001   PLT 332 06/24/2021 1330   MCV 79.3 (L) 08/14/2023 1001   MCV 83 06/24/2021 1330   MCV 84 06/26/2013 0409   MCH 24.8 (L) 08/14/2023 1001   MCHC 31.3 08/14/2023 1001   RDW 19.2 (H) 08/14/2023 1001   RDW 20.8 (H) 06/24/2021 1330   RDW 14.5 06/26/2013 0409   LYMPHSABS 1.6 08/14/2023 1001   LYMPHSABS 1.9 10/14/2020 1038   LYMPHSABS 1.5 06/26/2013 0409   MONOABS 0.7 08/14/2023 1001   MONOABS 0.7 06/26/2013 0409   EOSABS 0.0 08/14/2023 1001   EOSABS 0.0 10/14/2020 1038   EOSABS 0.0 06/26/2013 0409   BASOSABS 0.0 08/14/2023 1001   BASOSABS 0.1 10/14/2020 1038   BASOSABS 0.0 06/26/2013 0409    BMET    Component Value Date/Time   NA 138 12/19/2022 1123   NA 138 10/14/2020 1038   NA 139 06/26/2013 0409   K 4.2 12/19/2022 1123   K 4.3 06/26/2013 0409   CL 98 12/19/2022 1123   CL 106 06/26/2013 0409   CO2 30 12/19/2022 1123   CO2 28  06/26/2013 0409   GLUCOSE 103 (H) 12/19/2022 1123   GLUCOSE 108 (H) 06/26/2013 0409   BUN 20 12/19/2022 1123   BUN 11 10/14/2020 1038   BUN 12 06/26/2013 0409   CREATININE 0.93 12/19/2022 1123   CREATININE 0.72 06/26/2013 0409   CALCIUM  9.0 12/19/2022 1123   CALCIUM  8.5 06/26/2013 0409   GFRNONAA >60 12/19/2022 1123   GFRNONAA >60 06/26/2013 0409   GFRAA >60 05/24/2019 1420   GFRAA >60 06/26/2013 0409    BNP    Component Value Date/Time   BNP 5.3 11/17/2021 1104    ProBNP    Component Value Date/Time   PROBNP 10.0 05/07/2018 1351    Imaging: MM 3D DIAGNOSTIC MAMMOGRAM BILATERAL BREAST Result Date: 07/26/2023 CLINICAL DATA:  RIGHT nipple pain and focal pain in the RIGHT breast EXAM: DIGITAL DIAGNOSTIC BILATERAL MAMMOGRAM WITH TOMOSYNTHESIS AND CAD; ULTRASOUND RIGHT BREAST LIMITED TECHNIQUE:  Bilateral digital diagnostic mammography and breast tomosynthesis was performed. The images were evaluated with computer-aided detection. ; Targeted ultrasound examination of the right breast was performed COMPARISON:  Previous exam(s). ACR Breast Density Category b: There are scattered areas of fibroglandular density. FINDINGS: Spot compression tomosynthesis views were obtained over the area of focal pain in the RIGHT breast. No suspicious mammographic finding is identified in this area. No suspicious mass, microcalcification, or other finding is identified in either breast. On physical exam, no suspicious mass is appreciated. Targeted RIGHT breast ultrasound was performed in the area of pain at the upper inner breast. No suspicious solid or cystic mass is identified. Targeted RIGHT retroareolar breast ultrasound was performed of the additional site of painful concern. No suspicious cystic or solid mass is seen. IMPRESSION: 1. No mammographic or sonographic evidence of malignancy at the sites of painful concern in the RIGHT breast. Any further workup of the patient's symptoms should be based on the clinical assessment. Recommend routine annual screening mammogram in 1 year. 2. No mammographic evidence of malignancy bilaterally. 3. Breast pain is a common condition, which will often resolve on its own without intervention. It can be affected by hormonal changes, medication side effect, weight changes and fit of the bra. Pain may also be referred from other adjacent areas of the body. Breast pain may be improved by wearing adequate well-fitting support, over-the-counter topical and oral NSAID medication, low-fat diet, and ice/heat as needed. Studies have shown an improvement in cyclic pain with use of evening primrose oil, vitamin D  and vitamin E. Clinical follow-up recommended to discuss any further work-up recommendations and appropriate treatment. RECOMMENDATION: Screening mammogram in one year.(Code:SM-B-01Y) I  have discussed the findings and recommendations with the patient. If applicable, a reminder letter will be sent to the patient regarding the next appointment. BI-RADS CATEGORY  1: Negative. Electronically Signed   By: Corean Salter M.D.   On: 07/26/2023 14:21   US  LIMITED ULTRASOUND INCLUDING AXILLA RIGHT BREAST Result Date: 07/26/2023 CLINICAL DATA:  RIGHT nipple pain and focal pain in the RIGHT breast EXAM: DIGITAL DIAGNOSTIC BILATERAL MAMMOGRAM WITH TOMOSYNTHESIS AND CAD; ULTRASOUND RIGHT BREAST LIMITED TECHNIQUE: Bilateral digital diagnostic mammography and breast tomosynthesis was performed. The images were evaluated with computer-aided detection. ; Targeted ultrasound examination of the right breast was performed COMPARISON:  Previous exam(s). ACR Breast Density Category b: There are scattered areas of fibroglandular density. FINDINGS: Spot compression tomosynthesis views were obtained over the area of focal pain in the RIGHT breast. No suspicious mammographic finding is identified in this area. No suspicious mass, microcalcification, or other finding  is identified in either breast. On physical exam, no suspicious mass is appreciated. Targeted RIGHT breast ultrasound was performed in the area of pain at the upper inner breast. No suspicious solid or cystic mass is identified. Targeted RIGHT retroareolar breast ultrasound was performed of the additional site of painful concern. No suspicious cystic or solid mass is seen. IMPRESSION: 1. No mammographic or sonographic evidence of malignancy at the sites of painful concern in the RIGHT breast. Any further workup of the patient's symptoms should be based on the clinical assessment. Recommend routine annual screening mammogram in 1 year. 2. No mammographic evidence of malignancy bilaterally. 3. Breast pain is a common condition, which will often resolve on its own without intervention. It can be affected by hormonal changes, medication side effect, weight  changes and fit of the bra. Pain may also be referred from other adjacent areas of the body. Breast pain may be improved by wearing adequate well-fitting support, over-the-counter topical and oral NSAID medication, low-fat diet, and ice/heat as needed. Studies have shown an improvement in cyclic pain with use of evening primrose oil, vitamin D  and vitamin E. Clinical follow-up recommended to discuss any further work-up recommendations and appropriate treatment. RECOMMENDATION: Screening mammogram in one year.(Code:SM-B-01Y) I have discussed the findings and recommendations with the patient. If applicable, a reminder letter will be sent to the patient regarding the next appointment. BI-RADS CATEGORY  1: Negative. Electronically Signed   By: Corean Salter M.D.   On: 07/26/2023 14:21     Assessment & Plan:    1. OSA (obstructive sleep apnea) (Primary)  Assessment and Plan Assessment & Plan Obstructive sleep apnea Obstructive sleep apnea with CPAP intolerance primarily due to postnasal drip and sinus pressure. Reports condensation in the CPAP mask, particularly at night, leading to discomfort and reduced usage. Current nasal mask is not effectively managing the drainage, and there is interest in trying a nasal pillow mask. Also experiencing sinus pressure and is scheduled to see an ENT specialist. Discussed the possibility of using a nasal spray, Astelin , but prefers to consult with the ENT specialist first. Expressed interest in the Inspire device but decided against pursuing it after discussing the surgical risks, including infection, discomfort, and nerve damage. - Lower the humidification setting on the CPAP machine from level 4 to level 2 - Order a nasal pillow mask with [facility] ([facility]) - Instruct her to contact [facility] to schedule a fitting for the nasal pillow mask. - Write down the recommendation for Astelin  nasal spray for her to discuss with the ENT specialist.  Almarie LELON Ferrari, NP 08/25/2023

## 2023-08-25 NOTE — Patient Instructions (Addendum)
  VISIT SUMMARY: Today, we discussed your ongoing issues with your CPAP mask, including postnasal drip and sinus pressure, which have been causing discomfort and reducing your usage of the mask. We explored potential solutions and adjustments to improve your comfort and effectiveness of your sleep apnea treatment.  YOUR PLAN: -OBSTRUCTIVE SLEEP APNEA WITH CPAP INTOLERANCE: Obstructive sleep apnea is a condition where your airway becomes blocked during sleep, causing breathing interruptions. Your current CPAP mask is causing postnasal drip and sinus pressure, leading to discomfort and reduced usage. We recommend lowering the humidification setting on your CPAP machine and trying a nasal pillow mask to alleviate these issues. Please contact [facility] to schedule a fitting for the nasal pillow mask. Additionally, we discussed the possibility of using Astelin  nasal spray, but you should consult with your ENT specialist first. We also talked about the Inspire device, but you decided not to pursue it due to the surgical risks involved.  INSTRUCTIONS: Please contact [facility] to schedule a fitting for the nasal pillow mask. Discuss the recommendation for Astelin  nasal spray with your ENT specialist during your upcoming appointment. We will not proceed with a repeat sleep study for the Inspire device at this time.  Orders: Mask fitting for nasal pillow with [facility] ([facility]) Lower humidification from 4 to 2  Follow-up:  6 months

## 2023-08-29 ENCOUNTER — Inpatient Hospital Stay

## 2023-08-29 DIAGNOSIS — K828 Other specified diseases of gallbladder: Secondary | ICD-10-CM | POA: Diagnosis not present

## 2023-08-29 DIAGNOSIS — K219 Gastro-esophageal reflux disease without esophagitis: Secondary | ICD-10-CM | POA: Diagnosis not present

## 2023-08-29 DIAGNOSIS — J320 Chronic maxillary sinusitis: Secondary | ICD-10-CM | POA: Diagnosis not present

## 2023-08-29 DIAGNOSIS — R0982 Postnasal drip: Secondary | ICD-10-CM | POA: Diagnosis not present

## 2023-08-31 ENCOUNTER — Inpatient Hospital Stay: Attending: Oncology

## 2023-08-31 VITALS — BP 133/68 | HR 57 | Temp 97.3°F | Resp 18

## 2023-08-31 DIAGNOSIS — D509 Iron deficiency anemia, unspecified: Secondary | ICD-10-CM | POA: Diagnosis not present

## 2023-08-31 DIAGNOSIS — D62 Acute posthemorrhagic anemia: Secondary | ICD-10-CM

## 2023-08-31 DIAGNOSIS — D500 Iron deficiency anemia secondary to blood loss (chronic): Secondary | ICD-10-CM

## 2023-08-31 MED ORDER — IRON SUCROSE 20 MG/ML IV SOLN
200.0000 mg | Freq: Once | INTRAVENOUS | Status: AC
Start: 2023-08-31 — End: 2023-08-31
  Administered 2023-08-31: 200 mg via INTRAVENOUS
  Filled 2023-08-31: qty 10

## 2023-08-31 NOTE — Patient Instructions (Signed)

## 2023-09-05 ENCOUNTER — Telehealth: Payer: Self-pay

## 2023-09-05 NOTE — Telephone Encounter (Signed)
 Paper came in for pt from [facility] regarding CPAP pressure settings. This has been filled out, needs signature. Then ready to be faxed. NFN

## 2023-09-07 ENCOUNTER — Inpatient Hospital Stay

## 2023-09-07 VITALS — BP 117/59 | HR 55 | Temp 97.2°F | Resp 18

## 2023-09-07 DIAGNOSIS — D62 Acute posthemorrhagic anemia: Secondary | ICD-10-CM

## 2023-09-07 DIAGNOSIS — D500 Iron deficiency anemia secondary to blood loss (chronic): Secondary | ICD-10-CM

## 2023-09-07 DIAGNOSIS — D509 Iron deficiency anemia, unspecified: Secondary | ICD-10-CM | POA: Diagnosis not present

## 2023-09-07 MED ORDER — IRON SUCROSE 20 MG/ML IV SOLN
200.0000 mg | Freq: Once | INTRAVENOUS | Status: AC
  Administered 2023-09-07: 200 mg via INTRAVENOUS
  Filled 2023-09-07: qty 10

## 2023-09-07 NOTE — Patient Instructions (Signed)

## 2023-09-08 DIAGNOSIS — M5412 Radiculopathy, cervical region: Secondary | ICD-10-CM | POA: Diagnosis not present

## 2023-09-08 DIAGNOSIS — S46811S Strain of other muscles, fascia and tendons at shoulder and upper arm level, right arm, sequela: Secondary | ICD-10-CM | POA: Diagnosis not present

## 2023-09-08 DIAGNOSIS — M7061 Trochanteric bursitis, right hip: Secondary | ICD-10-CM | POA: Diagnosis not present

## 2023-09-08 DIAGNOSIS — M47812 Spondylosis without myelopathy or radiculopathy, cervical region: Secondary | ICD-10-CM | POA: Diagnosis not present

## 2023-09-13 DIAGNOSIS — R531 Weakness: Secondary | ICD-10-CM | POA: Diagnosis not present

## 2023-09-13 DIAGNOSIS — S46811A Strain of other muscles, fascia and tendons at shoulder and upper arm level, right arm, initial encounter: Secondary | ICD-10-CM | POA: Diagnosis not present

## 2023-09-13 DIAGNOSIS — M5412 Radiculopathy, cervical region: Secondary | ICD-10-CM | POA: Diagnosis not present

## 2023-09-14 ENCOUNTER — Other Ambulatory Visit: Payer: Self-pay | Admitting: Oncology

## 2023-09-14 ENCOUNTER — Inpatient Hospital Stay

## 2023-09-14 VITALS — BP 130/60 | HR 56 | Temp 97.5°F | Resp 16

## 2023-09-14 DIAGNOSIS — D509 Iron deficiency anemia, unspecified: Secondary | ICD-10-CM | POA: Diagnosis not present

## 2023-09-14 DIAGNOSIS — D62 Acute posthemorrhagic anemia: Secondary | ICD-10-CM

## 2023-09-14 DIAGNOSIS — D500 Iron deficiency anemia secondary to blood loss (chronic): Secondary | ICD-10-CM

## 2023-09-14 MED ORDER — IRON SUCROSE 20 MG/ML IV SOLN
200.0000 mg | Freq: Once | INTRAVENOUS | Status: AC
  Administered 2023-09-14: 200 mg via INTRAVENOUS
  Filled 2023-09-14: qty 10

## 2023-09-14 MED ORDER — SODIUM CHLORIDE 0.9% FLUSH
10.0000 mL | Freq: Once | INTRAVENOUS | Status: AC | PRN
  Administered 2023-09-14: 10 mL
  Filled 2023-09-14: qty 10

## 2023-09-22 DIAGNOSIS — M5412 Radiculopathy, cervical region: Secondary | ICD-10-CM | POA: Diagnosis not present

## 2023-09-23 DIAGNOSIS — Z23 Encounter for immunization: Secondary | ICD-10-CM | POA: Diagnosis not present

## 2023-09-25 DIAGNOSIS — J320 Chronic maxillary sinusitis: Secondary | ICD-10-CM | POA: Diagnosis not present

## 2023-09-25 DIAGNOSIS — J301 Allergic rhinitis due to pollen: Secondary | ICD-10-CM | POA: Diagnosis not present

## 2023-09-26 ENCOUNTER — Telehealth: Payer: Self-pay

## 2023-09-26 DIAGNOSIS — K219 Gastro-esophageal reflux disease without esophagitis: Secondary | ICD-10-CM

## 2023-09-26 NOTE — Telephone Encounter (Signed)
 Copied from CRM [id number]. Topic: Appointments - Scheduling Inquiry for Clinic >> Sep 26, 2023 11:26 AM Emylou G wrote: Reason for CRM: Patient called canceled 9/23 felt this was duplicate?  If it wasn't pls call her back to reschedule.  Need to also schedule for  pylori test for her stomach issue.

## 2023-09-26 NOTE — Telephone Encounter (Signed)
 I ordered celiac screen and h pylori breath test Please confirm she is not on PPI medication such as omeprazole  or pantoprozole or any in that class as can interfere with h pylori results  Sch f/u with me 1 week after labs obtained.

## 2023-09-26 NOTE — Addendum Note (Signed)
 Addended by: Hailey Miles G on: 09/26/2023 09:40 PM   Modules accepted: Orders

## 2023-09-28 NOTE — Telephone Encounter (Signed)
 LVM for Pt to call office back. When she call back schedule her a lab appointment ASAP before she sees Rollene on 10/11/2023

## 2023-09-29 ENCOUNTER — Telehealth: Payer: Self-pay

## 2023-09-29 ENCOUNTER — Other Ambulatory Visit: Payer: Self-pay

## 2023-09-29 DIAGNOSIS — R531 Weakness: Secondary | ICD-10-CM | POA: Diagnosis not present

## 2023-09-29 DIAGNOSIS — M5412 Radiculopathy, cervical region: Secondary | ICD-10-CM | POA: Diagnosis not present

## 2023-09-29 DIAGNOSIS — S46811A Strain of other muscles, fascia and tendons at shoulder and upper arm level, right arm, initial encounter: Secondary | ICD-10-CM | POA: Diagnosis not present

## 2023-09-29 MED ORDER — COVID-19 MRNA VAC-TRIS(PFIZER) 30 MCG/0.3ML IM SUSY: 0.3000 mL | PREFILLED_SYRINGE | Freq: Once | INTRAMUSCULAR | 0 refills | Status: AC

## 2023-09-29 MED ORDER — COVID-19 MRNA VAC-TRIS(PFIZER) 30 MCG/0.3ML IM SUSY: 0.3000 mL | PREFILLED_SYRINGE | Freq: Once | INTRAMUSCULAR | 0 refills | Status: DC

## 2023-09-29 NOTE — Telephone Encounter (Signed)
 Copied from CRM [id number]. Topic: Clinical - Medication Question >> Sep 29, 2023  9:42 AM Dedra B wrote: Reason for CRM: Pt is requesting a prescription for the covid vaccine be sent to [facility] on [facility] [address] [facility]. Pls notify pt when it has been sent.

## 2023-09-29 NOTE — Telephone Encounter (Signed)
 Left message to return call to our office.

## 2023-09-30 NOTE — Telephone Encounter (Signed)
 signed

## 2023-10-02 NOTE — Telephone Encounter (Signed)
 noted

## 2023-10-02 NOTE — Telephone Encounter (Signed)
 Spome to pt she is not taking any omeprazole  or pantoprazole  scheduled lab appt for 10/03/23

## 2023-10-03 ENCOUNTER — Other Ambulatory Visit (INDEPENDENT_AMBULATORY_CARE_PROVIDER_SITE_OTHER)

## 2023-10-03 ENCOUNTER — Other Ambulatory Visit: Payer: Self-pay | Admitting: Family

## 2023-10-03 DIAGNOSIS — K649 Unspecified hemorrhoids: Secondary | ICD-10-CM

## 2023-10-03 DIAGNOSIS — K219 Gastro-esophageal reflux disease without esophagitis: Secondary | ICD-10-CM | POA: Diagnosis not present

## 2023-10-03 DIAGNOSIS — R200 Anesthesia of skin: Secondary | ICD-10-CM | POA: Diagnosis not present

## 2023-10-03 NOTE — Telephone Encounter (Unsigned)
 Copied from CRM [id number]. Topic: Clinical - Medication Refill >> Oct 03, 2023  1:17 PM Chiquita SQUIBB wrote: Medication:  hydrocortisone  hydrocortisone  (ANUSOL -HC) 25 MG suppository   Has the patient contacted their pharmacy? Yes (Agent: If no, request that the patient contact the pharmacy for the refill. If patient does not wish to contact the pharmacy document the reason why and proceed with request.) (Agent: If yes, when and what did the pharmacy advise?)  This is the patient's preferred pharmacy:  [facility] [address] [place] - [address] [place] [address] Phone: [phone] Fax: [phone]  Is this the correct pharmacy for this prescription? Yes If no, delete pharmacy and type the correct one.   Has the prescription been filled recently? No  Is the patient out of the medication? Yes  Has the patient been seen for an appointment in the last year OR does the patient have an upcoming appointment? Yes  Can we respond through MyChart? Yes  Agent: Please be advised that Rx refills may take up to 3 business days. We ask that you follow-up with your pharmacy.

## 2023-10-04 MED ORDER — HYDROCORTISONE ACETATE 25 MG RE SUPP: RECTAL | 1 refills | Status: AC

## 2023-10-05 LAB — H. PYLORI BREATH TEST: H. pylori Breath Test: NOT DETECTED

## 2023-10-06 ENCOUNTER — Other Ambulatory Visit: Payer: Self-pay | Admitting: Family

## 2023-10-06 DIAGNOSIS — E039 Hypothyroidism, unspecified: Secondary | ICD-10-CM

## 2023-10-06 DIAGNOSIS — R531 Weakness: Secondary | ICD-10-CM | POA: Diagnosis not present

## 2023-10-06 DIAGNOSIS — S46811A Strain of other muscles, fascia and tendons at shoulder and upper arm level, right arm, initial encounter: Secondary | ICD-10-CM | POA: Diagnosis not present

## 2023-10-06 DIAGNOSIS — M7061 Trochanteric bursitis, right hip: Secondary | ICD-10-CM | POA: Diagnosis not present

## 2023-10-06 DIAGNOSIS — M47812 Spondylosis without myelopathy or radiculopathy, cervical region: Secondary | ICD-10-CM | POA: Diagnosis not present

## 2023-10-06 DIAGNOSIS — M5412 Radiculopathy, cervical region: Secondary | ICD-10-CM | POA: Diagnosis not present

## 2023-10-06 LAB — CELIAC DISEASE AB SCREEN W/RFX
Antigliadin Abs, IgA: 2 U (ref 0–19)
IgA/Immunoglobulin A, Serum: 157 mg/dL (ref 64–422)
Transglutaminase IgA: <2 U/mL (ref 0–3)

## 2023-10-07 DIAGNOSIS — Z23 Encounter for immunization: Secondary | ICD-10-CM | POA: Diagnosis not present

## 2023-10-08 ENCOUNTER — Ambulatory Visit: Payer: Self-pay | Admitting: Family

## 2023-10-10 ENCOUNTER — Ambulatory Visit: Admitting: Family

## 2023-10-11 ENCOUNTER — Encounter: Payer: Self-pay | Admitting: Family

## 2023-10-11 ENCOUNTER — Ambulatory Visit (INDEPENDENT_AMBULATORY_CARE_PROVIDER_SITE_OTHER)

## 2023-10-11 ENCOUNTER — Ambulatory Visit: Admitting: Family

## 2023-10-11 VITALS — BP 120/58 | HR 55 | Temp 98.2°F | Ht 61.0 in | Wt 184.2 lb

## 2023-10-11 DIAGNOSIS — R140 Abdominal distension (gaseous): Secondary | ICD-10-CM

## 2023-10-11 DIAGNOSIS — F32A Depression, unspecified: Secondary | ICD-10-CM

## 2023-10-11 DIAGNOSIS — I10 Essential (primary) hypertension: Secondary | ICD-10-CM | POA: Diagnosis not present

## 2023-10-11 DIAGNOSIS — E138 Other specified diabetes mellitus with unspecified complications: Secondary | ICD-10-CM

## 2023-10-11 DIAGNOSIS — D509 Iron deficiency anemia, unspecified: Secondary | ICD-10-CM | POA: Diagnosis not present

## 2023-10-11 DIAGNOSIS — R143 Flatulence: Secondary | ICD-10-CM

## 2023-10-11 DIAGNOSIS — Z87898 Personal history of other specified conditions: Secondary | ICD-10-CM | POA: Diagnosis not present

## 2023-10-11 DIAGNOSIS — F419 Anxiety disorder, unspecified: Secondary | ICD-10-CM | POA: Diagnosis not present

## 2023-10-11 DIAGNOSIS — E119 Type 2 diabetes mellitus without complications: Secondary | ICD-10-CM

## 2023-10-11 DIAGNOSIS — J309 Allergic rhinitis, unspecified: Secondary | ICD-10-CM

## 2023-10-11 DIAGNOSIS — K5900 Constipation, unspecified: Secondary | ICD-10-CM | POA: Diagnosis not present

## 2023-10-11 DIAGNOSIS — D62 Acute posthemorrhagic anemia: Secondary | ICD-10-CM | POA: Diagnosis not present

## 2023-10-11 LAB — HEMOGLOBIN A1C: Hgb A1c MFr Bld: 6.1 % (ref 4.6–6.5)

## 2023-10-11 MED ORDER — PANTOPRAZOLE SODIUM 20 MG PO TBEC: 20.0000 mg | DELAYED_RELEASE_TABLET | ORAL | 1 refills | Status: DC

## 2023-10-11 MED ORDER — TRAZODONE HCL 50 MG PO TABS
ORAL_TABLET | ORAL | 4 refills | Status: AC
Start: 2023-10-11 — End: ?

## 2023-10-11 MED ORDER — LOSARTAN POTASSIUM 100 MG PO TABS: 100.0000 mg | ORAL_TABLET | Freq: Every day | ORAL | 1 refills | Status: DC

## 2023-10-11 MED ORDER — CETIRIZINE HCL 10 MG PO TABS: 10.0000 mg | ORAL_TABLET | Freq: Every day | ORAL | 3 refills | Status: DC

## 2023-10-11 NOTE — Progress Notes (Unsigned)
 Assessment & Plan:  Excessive flatus Assessment & Plan: Etiology unknown at this time.  Discussed history of chronic constipation.  Pending abdominal x-ray.  Trial of Protonix  20 mg daily.  Consider CT abdomen and pelvis.  Close follow-up.  Orders: -     DG Abd 1 View; Future  Allergic rhinitis, unspecified seasonality, unspecified trigger -     Cetirizine  HCl; Take 1 tablet (10 mg total) by mouth daily.  Dispense: 30 tablet; Refill: 3  Anxiety and depression -     traZODone  HCl; TAKE 1 TABLET BY MOUTH EVERY DAY FOR SLEEP  Dispense: 90 tablet; Refill: 4  Diabetes mellitus without complication (HCC) -     Microalbumin / creatinine urine ratio  Essential (primary) hypertension  History of prediabetes -     Hemoglobin A1c  Other specified diabetes mellitus with unspecified complications (HCC) -     Hemoglobin A1c  Acute blood loss anemia  Chronic iron  deficiency anemia  Abdominal distension Assessment & Plan: Chronic, uncontrolled.  Negative celiac antibody and H. pylori 10/03/2023.  Differential includes constipation, GERD.  Start low-dose Protonix  daily, 30 minutes before meals. Continue Pepcid  20mg  BID as needed for reflux symptoms. Pending abdominal x-ray to assess for constipation. Consider a CT scan if the x-ray is unrevealing or symptoms persist.   Essential hypertension Assessment & Plan: Well-controlled today.   Continue losartan  100 mg daily. Advised to have a prn approach ( for now) and take triamterene  hydrochlorothiazide   0.5  to 1 tablet daily prn for BP > 140/80.  Continue Lasix 40 mg daily as prescribed by cardiology for leg swelling.    Other orders -     Losartan  Potassium; Take 1 tablet (100 mg total) by mouth daily. TAKE 1 TABLET BY MOUTH EVERYDAY AT BEDTIME  Dispense: 90 tablet; Refill: 1 -     Pantoprazole  Sodium; Take 1 tablet (20 mg total) by mouth every morning. Take 30 minutes to hour before breakfast  Dispense: 30 tablet; Refill: 1     Return  precautions given.   Risks, benefits, and alternatives of the medications and treatment plan prescribed today were discussed, and patient expressed understanding.   Education regarding symptom management and diagnosis given to patient on AVS either electronically or printed.  No follow-ups on file.  [name], FNP  Subjective:    Patient ID: Jennifer [name], female    DOB: 1950/09/02, 73 y.o.   MRN: [id number]  CC: Jennifer [name] is a 73 y.o. female who presents today for follow up.   HPI: HPI     Discussed the use of AI scribe software for clinical note transcription with the patient, who gave verbal consent to proceed.  History of Present Illness   Jennifer [name] is a 73 year old female who presents with increased gas and abdominal distension.  She has been experiencing increased gas and abdominal distension for the past three to four months. The gas is excessive, particularly after eating, and she describes her stomach as 'popping up and just hurting.' She has been using Gas-X without relief and takes Pepcid  20 mg once or twice daily, which helps especially after meals. No fever, diarrhea, significant burping or diarrhea is noted, but she mentions occasional constipation, which she manages with MiraLAX  every other day and OTC ' vegetable laxative' as needed.  Her history of acid reflux is managed with Pepcid . She recalls being on pantoprazole  in the past but does not remember why it was discontinued. She reports that during her upper  endoscopy in May, she was told there was a lot of stool in her intestines, and she notes that bowel prep for procedures does not completely clear her bowels. Her diet includes a lot of fruit and sweets, and she does not consume much milk but eats cheese. She denies worsening of gas with lactose intake. Celiac blood work and H. pylori test were negative.    Last seen by Dr. Babara 08/17/2023 for iron  deficiency anemia due to chronic blood loss,  recommended IV iron . Celiac abs and h pylori negative 10/03/23   H/o bariatric surgery, abdominal hysterectomy     Assessment and Plan      Obesity   She is obese and interested in weight loss medications. Zepbound is not recommended due to gastrointestinal symptoms. Metformin  is discussed for appetite suppression. Consider metformin  if interested. Reassess for weight loss medications once gastrointestinal symptoms are resolved.  Allergic rhinitis   She has chronic allergic rhinitis with postnasal drip, likely due to allergies rather than infection. Previous treatments were ineffective. Continue azelastine  nasal spray, Flonase  nasal spray, and Zyrtec  for allergies. Discuss with ENT if symptoms persist.        Allergies: Bee pollen, Celecoxib, Hydrocodone , Pollen extract, Sodium ferric gluconate [ferrous gluconate], Nasacort  [triamcinolone ], and Vicodin [hydrocodone -acetaminophen ] Current Outpatient Medications on File Prior to Visit  Medication Sig Dispense Refill   acetaminophen  (TYLENOL ) 325 MG tablet Take 2 tablets (650 mg total) by mouth every 6 (six) hours as needed for mild pain (or Fever >/= 101).     acyclovir  (ZOVIRAX ) 400 MG tablet Take 1 tablet (400 mg total) by mouth 2 (two) times daily. 180 tablet 3   famotidine  (PEPCID ) 20 MG tablet TAKE 1 TABLET (20 MG TOTAL) BY MOUTH AT BEDTIME AS NEEDED FOR HEARTBURN OR INDIGESTION. 30 tablet 5   FLUoxetine (PROZAC) 40 MG capsule Take 40 mg by mouth daily.     fluticasone  (FLONASE ) 50 MCG/ACT nasal spray INSTILL 1 SPRAY INTO BOTH NOSTRILS DAILY 48 mL 0   folic acid  (FOLVITE ) 1 MG tablet Take 1 tablet (1 mg total) by mouth daily. 90 tablet 3   furosemide (LASIX) 20 MG tablet Take 40 mg by mouth daily.     gabapentin  (NEURONTIN ) 300 MG capsule Take 900mg  qam, 300mg  at noon, and 900mg  at bedtime. 150 capsule 5   GAVILYTE-G 236 g solution Take 4,000 mLs by mouth as directed.     hydrocortisone  (ANUSOL -HC) 25 MG suppository Unwrap and insert  1 suppository rectally twice a day prn 12 suppository 1   levothyroxine  (SYNTHROID ) 50 MCG tablet TAKE 1 TABLET BY MOUTH EVERY DAY 90 tablet 2   Multiple Vitamin (MULTIVITAMIN WITH MINERALS) TABS tablet Take 1 tablet by mouth daily.     naltrexone (DEPADE) 50 MG tablet Take by mouth daily. Patient takes  one  50 mg tablet daily at the same time     potassium chloride  SA (KLOR-CON  M) 20 MEQ tablet Take 1 tablet (20 mEq total) by mouth daily.     sodium chloride  (OCEAN) 0.65 % SOLN nasal spray Place 2 sprays into both nostrils as needed for congestion. 30 mL 2   triamterene -hydrochlorothiazide  (MAXZIDE -25) 37.5-25 MG tablet Take 0.5  to 1 tablet daily prn for BP > 140/80. 90 tablet 3   FLUoxetine (PROZAC) 20 MG capsule Take 40 mg by mouth daily. (Patient not taking: Reported on 10/11/2023)     gabapentin  (NEURONTIN ) 100 MG capsule Take 100mg  PO qam and 100mg  evening. (Patient not taking: Reported on 08/25/2023)  180 capsule 3   Na Sulfate-K Sulfate-Mg Sulfate concentrate (SUPREP) 17.5-3.13-1.6 GM/177ML SOLN Take 354 mLs by mouth as directed. (Patient not taking: Reported on 10/11/2023)     RYALTRIS 665-25 MCG/ACT SUSP Place 1 spray into the nose at bedtime. 1 spray each nostril     No current facility-administered medications on file prior to visit.    Review of Systems  Constitutional:  Negative for chills and fever.  Respiratory:  Negative for cough.   Cardiovascular:  Negative for chest pain and palpitations.  Gastrointestinal:  Positive for abdominal distention and constipation. Negative for abdominal pain, nausea and vomiting.  Genitourinary:  Negative for difficulty urinating.      Objective:    BP (!) 120/58   Pulse (!) 55   Temp 98.2 F (36.8 C) (Oral)   Ht 5' 1 (1.549 m)   Wt 184 lb 3.2 oz (83.6 kg)   SpO2 98%   BMI 34.80 kg/m  BP Readings from Last 3 Encounters:  10/11/23 (!) 120/58  09/14/23 130/60  09/07/23 (!) 117/59   Wt Readings from Last 3 Encounters:  10/11/23 184  lb 3.2 oz (83.6 kg)  08/25/23 172 lb 6.4 oz (78.2 kg)  08/17/23 175 lb 1.6 oz (79.4 kg)    Physical Exam Vitals reviewed.  Constitutional:      Appearance: Normal appearance. She is well-developed.  Eyes:     Conjunctiva/sclera: Conjunctivae normal.  Cardiovascular:     Rate and Rhythm: Normal rate and regular rhythm.     Pulses: Normal pulses.     Heart sounds: Normal heart sounds.  Pulmonary:     Effort: Pulmonary effort is normal.     Breath sounds: Normal breath sounds. No wheezing, rhonchi or rales.  Abdominal:     General: Bowel sounds are normal. There is distension (diffusely).     Palpations: Abdomen is soft. Abdomen is not rigid. There is no fluid wave or mass.     Tenderness: There is no abdominal tenderness. There is no guarding or rebound.     Comments: Bowel sounds present all 4 quadrants  Skin:    General: Skin is warm and dry.  Neurological:     Mental Status: She is alert.  Psychiatric:        Speech: Speech normal.        Behavior: Behavior normal.        Thought Content: Thought content normal.

## 2023-10-11 NOTE — Assessment & Plan Note (Signed)
 Etiology unknown at this time.  Discussed history of chronic constipation.  Pending abdominal x-ray.  Trial of Protonix  20 mg daily.  Consider CT abdomen and pelvis.  Close follow-up.

## 2023-10-11 NOTE — Patient Instructions (Signed)
 Start Protonix  20 mg daily.  Pending x-ray to look for constipation.  Please let me know if excess gas does not improve with Protonix .

## 2023-10-12 ENCOUNTER — Ambulatory Visit: Payer: Self-pay | Admitting: Family

## 2023-10-12 LAB — MICROALBUMIN / CREATININE URINE RATIO
Creatinine,U: 14.1 mg/dL
Microalb Creat Ratio: UNDETERMINED mg/g (ref 0.0–30.0)
Microalb, Ur: <0.7 mg/dL

## 2023-10-13 ENCOUNTER — Other Ambulatory Visit: Payer: Self-pay | Admitting: Family

## 2023-10-13 DIAGNOSIS — R140 Abdominal distension (gaseous): Secondary | ICD-10-CM | POA: Insufficient documentation

## 2023-10-13 DIAGNOSIS — G8929 Other chronic pain: Secondary | ICD-10-CM

## 2023-10-13 NOTE — Assessment & Plan Note (Signed)
 Chronic, uncontrolled.  Negative celiac antibody and H. pylori 10/03/2023.  Differential includes constipation, GERD.  Start low-dose Protonix  daily, 30 minutes before meals. Continue Pepcid  20mg  BID as needed for reflux symptoms. Pending abdominal x-ray to assess for constipation. Consider a CT scan if the x-ray is unrevealing or symptoms persist.

## 2023-10-13 NOTE — Assessment & Plan Note (Signed)
 Well-controlled today.   Continue losartan  100 mg daily. Advised to have a prn approach ( for now) and take triamterene  hydrochlorothiazide   0.5  to 1 tablet daily prn for BP > 140/80.  Continue Lasix 40 mg daily as prescribed by cardiology for leg swelling.

## 2023-10-14 ENCOUNTER — Other Ambulatory Visit: Payer: Self-pay | Admitting: Family

## 2023-10-14 DIAGNOSIS — G8929 Other chronic pain: Secondary | ICD-10-CM

## 2023-10-16 ENCOUNTER — Telehealth: Payer: Self-pay

## 2023-10-16 DIAGNOSIS — G629 Polyneuropathy, unspecified: Secondary | ICD-10-CM | POA: Diagnosis not present

## 2023-10-16 DIAGNOSIS — G501 Atypical facial pain: Secondary | ICD-10-CM | POA: Diagnosis not present

## 2023-10-16 NOTE — Telephone Encounter (Signed)
 Copied from CRM #[id number]. Topic: General - Other >> Oct 16, 2023  2:02 PM Frederich PARAS wrote: Reason for CRM: pt calling, in regards to the weight loss medication, she saw her on 9/24. She never got the prescription . Pt is going out of the country on wed 10[place]18/23 pt needs the medication asap. Pt callback # is [phone]

## 2023-10-17 MED ORDER — METFORMIN HCL ER 500 MG PO TB24: 500.0000 mg | ORAL_TABLET | Freq: Every day | ORAL | 0 refills | Status: DC

## 2023-10-17 NOTE — Telephone Encounter (Signed)
 Spoke to pt informed her of message below per Rollene, pt verbalized understanding, but would like to know if she can start the Metformin  when she comes back from her cruise that she leaves on Thursday to [place]  for 9 days

## 2023-10-17 NOTE — Addendum Note (Signed)
 Addended by: Gyanna Jarema on: 10/17/2023 03:49 PM   Modules accepted: Orders

## 2023-10-18 ENCOUNTER — Other Ambulatory Visit: Payer: Self-pay | Admitting: Family

## 2023-10-18 NOTE — Telephone Encounter (Signed)
 Pt has responded via mychart.

## 2023-10-18 NOTE — Telephone Encounter (Signed)
 Sent mychart

## 2023-10-18 NOTE — Telephone Encounter (Signed)
 Noted

## 2023-10-19 ENCOUNTER — Telehealth: Payer: Self-pay

## 2023-10-19 NOTE — Telephone Encounter (Signed)
 LVM to call back to inquire about pt medications per message below

## 2023-10-19 NOTE — Telephone Encounter (Signed)
-----   Message from [name] sent at 10/13/2023  5:47 AM EDT ----- Call patient I wanted to clarify.  It appears she is not taking Prozac 20 mg nor gabapentin  100 mg.  Please clarify exactly how she is taking gabapentin  and Prozac and at what doses.  Please correct medication list.

## 2023-10-23 NOTE — Telephone Encounter (Signed)
 LVM for pt to call office back to confirm she is taking these medications accurately

## 2023-10-30 NOTE — Telephone Encounter (Signed)
 2nd attempt reaching pt. LVM stating for pt to call office back regarding her medication

## 2023-11-04 ENCOUNTER — Other Ambulatory Visit: Payer: Self-pay | Admitting: Family

## 2023-11-06 ENCOUNTER — Ambulatory Visit: Payer: Self-pay

## 2023-11-06 NOTE — Telephone Encounter (Signed)
 Noted
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
## 2023-11-07 ENCOUNTER — Encounter: Payer: Self-pay | Admitting: Family

## 2023-11-07 ENCOUNTER — Ambulatory Visit: Admitting: Family

## 2023-11-07 VITALS — BP 116/76 | HR 57 | Temp 97.9°F | Ht 61.0 in | Wt 185.0 lb

## 2023-11-07 DIAGNOSIS — T24301A Burn of third degree of unspecified site of right lower limb, except ankle and foot, initial encounter: Secondary | ICD-10-CM

## 2023-11-07 DIAGNOSIS — J019 Acute sinusitis, unspecified: Secondary | ICD-10-CM | POA: Diagnosis not present

## 2023-11-07 DIAGNOSIS — B9689 Other specified bacterial agents as the cause of diseases classified elsewhere: Secondary | ICD-10-CM

## 2023-11-07 MED ORDER — AMOXICILLIN-POT CLAVULANATE 875-125 MG PO TABS: 1.0000 | ORAL_TABLET | Freq: Two times a day (BID) | ORAL | 0 refills | Status: DC
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
## 2023-11-08 ENCOUNTER — Other Ambulatory Visit: Payer: Self-pay | Admitting: Family

## 2023-11-08 DIAGNOSIS — G8929 Other chronic pain: Secondary | ICD-10-CM

## 2023-11-08 MED ORDER — GABAPENTIN 300 MG PO CAPS: ORAL_CAPSULE | ORAL | 4 refills | Status: AC

## 2023-11-08 NOTE — Telephone Encounter (Signed)
 Below is exactly what pt stated that she is taking, she has appt scheduled for 12/12/23

## 2023-11-09 ENCOUNTER — Other Ambulatory Visit: Payer: Self-pay | Admitting: Family

## 2023-11-17 ENCOUNTER — Encounter: Payer: Self-pay | Admitting: Nurse Practitioner

## 2023-11-17 ENCOUNTER — Ambulatory Visit (INDEPENDENT_AMBULATORY_CARE_PROVIDER_SITE_OTHER): Admitting: Nurse Practitioner

## 2023-11-17 VITALS — BP 121/69 | HR 55 | Temp 97.9°F | Ht 61.0 in | Wt 183.8 lb

## 2023-11-17 DIAGNOSIS — R051 Acute cough: Secondary | ICD-10-CM

## 2023-11-17 MED ORDER — METHYLPREDNISOLONE 4 MG PO TBPK: ORAL_TABLET | ORAL | 0 refills | Status: DC

## 2023-11-17 MED ORDER — BENZONATATE 200 MG PO CAPS: 200.0000 mg | ORAL_CAPSULE | Freq: Three times a day (TID) | ORAL | 0 refills | Status: DC | PRN

## 2023-11-17 NOTE — Progress Notes (Signed)
 Leron Glance, NP-C Phone: [phone]  Jennifer [name] is a 73 y.o. female who presents today for cough and congestion.   Discussed the use of AI scribe software for clinical note transcription with the patient, who gave verbal consent to proceed.  History of Present Illness   Jennifer [name] is a 73 year old female who presents with persistent cough and congestion.  She has been experiencing a persistent cough and congestion for about three weeks. She completed a ten-day course of Augmentin  (amoxicillin  and clavulanate) yesterday, which provided some improvement but not complete resolution of her symptoms. The cough is productive, with phlegm draining into her throat, causing irritation. She experiences more congestion in her sinuses than in her chest, with a sensation of pressure in both areas. A sore throat was more pronounced two weeks ago but is now just irritated from frequent coughing.  No fever, but she reports slight shortness of breath and fatigue. No headaches, nausea, or vomiting recently, though these symptoms were present over two weeks ago. Her symptoms began around the time she was on a cruise. She experiences more coughing at night and early in the morning, which she attributes to drainage while lying down.  Her current medications include Tylenol  and nasal sprays (Flonase  and another unspecified spray). She has not been using Mucinex  or other decongestants. She has a history of prediabetes but does not monitor her blood sugar. Additionally, she takes gabapentin  for neuropathy, noting that if she does not take it every seven hours, she experiences symptoms in her feet, which seem to be worsening and moving up her legs.      Social History   Tobacco Use  Smoking Status Former   Current packs/day: 0.00   Average packs/day: 1.5 packs/day for 24.0 years (36.0 ttl pk-yrs)   Types: Cigarettes   Start date: 62   Quit date: 1993   Years since quitting: 32.8  Smokeless  Tobacco Never  Tobacco Comments   quit 1995.     Current Outpatient Medications on File Prior to Visit  Medication Sig Dispense Refill   acetaminophen  (TYLENOL ) 325 MG tablet Take 2 tablets (650 mg total) by mouth every 6 (six) hours as needed for mild pain (or Fever >/= 101).     acyclovir  (ZOVIRAX ) 400 MG tablet Take 1 tablet (400 mg total) by mouth 2 (two) times daily. 180 tablet 3   cetirizine  (ZYRTEC ) 10 MG tablet Take 1 tablet (10 mg total) by mouth daily. 30 tablet 3   famotidine  (PEPCID ) 20 MG tablet TAKE 1 TABLET (20 MG TOTAL) BY MOUTH AT BEDTIME AS NEEDED FOR HEARTBURN OR INDIGESTION. 30 tablet 5   FLUoxetine (PROZAC) 40 MG capsule Take 40 mg by mouth daily.     fluticasone  (FLONASE ) 50 MCG/ACT nasal spray INSTILL 1 SPRAY INTO BOTH NOSTRILS DAILY 48 mL 0   folic acid  (FOLVITE ) 1 MG tablet Take 1 tablet (1 mg total) by mouth daily. 90 tablet 3   furosemide (LASIX) 20 MG tablet Take 40 mg by mouth daily.     gabapentin  (NEURONTIN ) 300 MG capsule TAKE 3 CAPSULES IN THE MORNING, 1 CAPSULE AT NOON, AND 3 CAPSULES AT BEDTIME 210 capsule 4   GAVILYTE-G 236 g solution Take 4,000 mLs by mouth as directed.     hydrocortisone  (ANUSOL -HC) 25 MG suppository Unwrap and insert 1 suppository rectally twice a day prn 12 suppository 1   levothyroxine  (SYNTHROID ) 50 MCG tablet TAKE 1 TABLET BY MOUTH EVERY DAY 90 tablet 2   losartan  (  COZAAR ) 100 MG tablet Take 1 tablet (100 mg total) by mouth daily. TAKE 1 TABLET BY MOUTH EVERYDAY AT BEDTIME 90 tablet 1   metFORMIN  (GLUCOPHAGE -XR) 500 MG 24 hr tablet TAKE 1 TABLET BY MOUTH EVERY DAY WITH BREAKFAST 90 tablet 1   Multiple Vitamin (MULTIVITAMIN WITH MINERALS) TABS tablet Take 1 tablet by mouth daily.     Na Sulfate-K Sulfate-Mg Sulfate concentrate (SUPREP) 17.5-3.13-1.6 GM/177ML SOLN Take 354 mLs by mouth as directed.     naltrexone (DEPADE) 50 MG tablet Take by mouth daily. Patient takes  one  50 mg tablet daily at the same time     pantoprazole   (PROTONIX ) 20 MG tablet TAKE 1 TABLET (20 MG TOTAL) BY MOUTH EVERY MORNING. TAKE 30 MINUTES TO HOUR BEFORE BREAKFAST 90 tablet 3   potassium chloride  SA (KLOR-CON  M) 20 MEQ tablet Take 1 tablet (20 mEq total) by mouth daily.     sodium chloride  (OCEAN) 0.65 % SOLN nasal spray Place 2 sprays into both nostrils as needed for congestion. 30 mL 2   traZODone  (DESYREL ) 50 MG tablet TAKE 1 TABLET BY MOUTH EVERY DAY FOR SLEEP 90 tablet 4   triamterene -hydrochlorothiazide  (MAXZIDE -25) 37.5-25 MG tablet Take 0.5  to 1 tablet daily prn for BP > 140/80. 90 tablet 3   RYALTRIS 665-25 MCG/ACT SUSP Place 1 spray into the nose at bedtime. 1 spray each nostril     No current facility-administered medications on file prior to visit.     ROS see history of present illness  Objective  Physical Exam Vitals:   11/17/23 1436  BP: 121/69  Pulse: (!) 55  Temp: 97.9 F (36.6 C)  SpO2: 98%    BP Readings from Last 3 Encounters:  11/17/23 121/69  11/07/23 116/76  10/11/23 (!) 120/58   Wt Readings from Last 3 Encounters:  11/17/23 183 lb 12.8 oz (83.4 kg)  11/07/23 185 lb (83.9 kg)  10/11/23 184 lb 3.2 oz (83.6 kg)    Physical Exam Constitutional:      General: She is not in acute distress.    Appearance: Normal appearance.  HENT:     Head: Normocephalic.     Right Ear: Tympanic membrane normal.     Left Ear: Tympanic membrane normal.     Nose: Congestion present.     Mouth/Throat:     Mouth: Mucous membranes are moist.     Pharynx: Oropharynx is clear.  Eyes:     Conjunctiva/sclera: Conjunctivae normal.     Pupils: Pupils are equal, round, and reactive to light.  Cardiovascular:     Rate and Rhythm: Normal rate and regular rhythm.     Heart sounds: Normal heart sounds.  Pulmonary:     Effort: Pulmonary effort is normal.     Breath sounds: Normal breath sounds.  Lymphadenopathy:     Cervical: No cervical adenopathy.  Skin:    General: Skin is warm and dry.  Neurological:      General: No focal deficit present.     Mental Status: She is alert.  Psychiatric:        Mood and Affect: Mood normal.        Behavior: Behavior normal.      Assessment/Plan: Please see individual problem list.  Acute cough Assessment & Plan: Persistent sinus congestion and productive cough continue despite Augmentin . Symptoms include sinus pressure, throat irritation, and slight shortness of breath. Cough is likely due to post-nasal drip. Prescribe methylprednisolone dose pack for cough and sinus congestion.  Counseled on common side effects. Prescribe benzonatate  for cough, up to three times daily as needed. Continue Tylenol  and nasal sprays (Flonase  and another) for symptom relief. Optional use of Mucinex  to loosen congestion. Encourage hydration. Return precautions given to patient.   Orders: -     Benzonatate ; Take 1 capsule (200 mg total) by mouth 3 (three) times daily as needed for cough.  Dispense: 30 capsule; Refill: 0 -     methylPREDNISolone; Take as directed.  Dispense: 21 each; Refill: 0      Return if symptoms worsen or fail to improve.   Leron Glance, NP-C [facility]

## 2023-11-17 NOTE — Assessment & Plan Note (Addendum)
 Persistent sinus congestion and productive cough continue despite Augmentin . Symptoms include sinus pressure, throat irritation, and slight shortness of breath. Cough is likely due to post-nasal drip. Prescribe methylprednisolone dose pack for cough and sinus congestion. Counseled on common side effects. Prescribe benzonatate  for cough, up to three times daily as needed. Continue Tylenol  and nasal sprays (Flonase  and another) for symptom relief. Optional use of Mucinex  to loosen congestion. Encourage hydration. Return precautions given to patient.

## 2023-11-21 DIAGNOSIS — R600 Localized edema: Secondary | ICD-10-CM | POA: Diagnosis not present

## 2023-11-21 DIAGNOSIS — G4733 Obstructive sleep apnea (adult) (pediatric): Secondary | ICD-10-CM | POA: Diagnosis not present

## 2023-11-21 DIAGNOSIS — I10 Essential (primary) hypertension: Secondary | ICD-10-CM | POA: Diagnosis not present

## 2023-11-21 DIAGNOSIS — R001 Bradycardia, unspecified: Secondary | ICD-10-CM | POA: Diagnosis not present

## 2023-11-21 DIAGNOSIS — I2510 Atherosclerotic heart disease of native coronary artery without angina pectoris: Secondary | ICD-10-CM | POA: Diagnosis not present

## 2023-11-21 DIAGNOSIS — E7849 Other hyperlipidemia: Secondary | ICD-10-CM | POA: Diagnosis not present

## 2023-11-21 DIAGNOSIS — I872 Venous insufficiency (chronic) (peripheral): Secondary | ICD-10-CM | POA: Diagnosis not present

## 2023-11-21 DIAGNOSIS — R635 Abnormal weight gain: Secondary | ICD-10-CM | POA: Diagnosis not present

## 2023-11-21 DIAGNOSIS — I071 Rheumatic tricuspid insufficiency: Secondary | ICD-10-CM | POA: Diagnosis not present

## 2023-11-27 ENCOUNTER — Other Ambulatory Visit: Payer: Self-pay | Admitting: Family

## 2023-11-27 DIAGNOSIS — K219 Gastro-esophageal reflux disease without esophagitis: Secondary | ICD-10-CM

## 2023-11-30 DIAGNOSIS — J301 Allergic rhinitis due to pollen: Secondary | ICD-10-CM | POA: Diagnosis not present

## 2023-11-30 DIAGNOSIS — R1314 Dysphagia, pharyngoesophageal phase: Secondary | ICD-10-CM | POA: Diagnosis not present

## 2023-11-30 DIAGNOSIS — D509 Iron deficiency anemia, unspecified: Secondary | ICD-10-CM | POA: Diagnosis not present

## 2023-11-30 DIAGNOSIS — K219 Gastro-esophageal reflux disease without esophagitis: Secondary | ICD-10-CM | POA: Diagnosis not present

## 2023-11-30 DIAGNOSIS — R0982 Postnasal drip: Secondary | ICD-10-CM | POA: Diagnosis not present

## 2023-11-30 DIAGNOSIS — K5900 Constipation, unspecified: Secondary | ICD-10-CM | POA: Diagnosis not present

## 2023-11-30 DIAGNOSIS — I071 Rheumatic tricuspid insufficiency: Secondary | ICD-10-CM | POA: Diagnosis not present

## 2023-12-01 DIAGNOSIS — M47812 Spondylosis without myelopathy or radiculopathy, cervical region: Secondary | ICD-10-CM | POA: Diagnosis not present

## 2023-12-01 DIAGNOSIS — G8929 Other chronic pain: Secondary | ICD-10-CM | POA: Diagnosis not present

## 2023-12-01 DIAGNOSIS — M542 Cervicalgia: Secondary | ICD-10-CM | POA: Diagnosis not present

## 2023-12-01 DIAGNOSIS — M2550 Pain in unspecified joint: Secondary | ICD-10-CM | POA: Diagnosis not present

## 2023-12-01 DIAGNOSIS — M5412 Radiculopathy, cervical region: Secondary | ICD-10-CM | POA: Diagnosis not present

## 2023-12-02 ENCOUNTER — Telehealth: Payer: Self-pay | Admitting: Family

## 2023-12-02 NOTE — Telephone Encounter (Signed)
 Call patient I was notified by pharmacy that she is taking tizanidine which is a muscle relaxant.  It looks like this may have been prescribed in July of this year.  I am not exactly sure as I have not prescribed it.  Tizanidine interferes with Pepcid  (reflux medication) and also acyclovir  (antiviral medication).  Please advise her to stop tizanidine

## 2023-12-04 NOTE — Telephone Encounter (Signed)
 Spoke to pt she stated that she is not taking the Tizanidine but also would like to know if she should be taking the Metformin  along with the Famotidine  and Pantoprozole? Please advise

## 2023-12-05 ENCOUNTER — Encounter: Admitting: Physician Assistant

## 2023-12-06 NOTE — Telephone Encounter (Signed)
 Spoke to pt informed her of message below per Rollene pt verbalizes understanding

## 2023-12-11 DIAGNOSIS — R001 Bradycardia, unspecified: Secondary | ICD-10-CM | POA: Diagnosis not present

## 2023-12-12 ENCOUNTER — Ambulatory Visit (INDEPENDENT_AMBULATORY_CARE_PROVIDER_SITE_OTHER): Admitting: Family

## 2023-12-12 ENCOUNTER — Encounter: Payer: Self-pay | Admitting: Family

## 2023-12-12 VITALS — BP 124/70 | HR 65 | Temp 98.6°F | Ht 60.0 in | Wt 186.0 lb

## 2023-12-12 DIAGNOSIS — R413 Other amnesia: Secondary | ICD-10-CM

## 2023-12-12 DIAGNOSIS — K5900 Constipation, unspecified: Secondary | ICD-10-CM

## 2023-12-12 DIAGNOSIS — I10 Essential (primary) hypertension: Secondary | ICD-10-CM | POA: Diagnosis not present

## 2023-12-12 DIAGNOSIS — N644 Mastodynia: Secondary | ICD-10-CM

## 2023-12-12 DIAGNOSIS — R7303 Prediabetes: Secondary | ICD-10-CM

## 2023-12-12 LAB — TSH: TSH: 2.42 u[IU]/mL (ref 0.35–5.50)

## 2023-12-12 LAB — BASIC METABOLIC PANEL WITH GFR
BUN: 7 mg/dL (ref 6–23)
CO2: 34 meq/L — ABNORMAL HIGH (ref 19–32)
Calcium: 9.2 mg/dL (ref 8.4–10.5)
Chloride: 99 meq/L (ref 96–112)
Creatinine, Ser: 0.75 mg/dL (ref 0.40–1.20)
GFR: 78.48 mL/min (ref >60.00)
Glucose, Bld: 82 mg/dL (ref 70–99)
Potassium: 4.5 meq/L (ref 3.5–5.1)
Sodium: 137 meq/L (ref 135–145)

## 2023-12-12 LAB — B12 AND FOLATE PANEL
Folate: 11.5 ng/mL (ref >5.9)
Vitamin B-12: 678 pg/mL (ref 211–911)

## 2023-12-12 MED ORDER — METFORMIN HCL ER 500 MG PO TB24: 1000.0000 mg | ORAL_TABLET | Freq: Two times a day (BID) | ORAL | 3 refills | Status: DC

## 2023-12-12 MED ORDER — LOSARTAN POTASSIUM 100 MG PO TABS
100.0000 mg | ORAL_TABLET | Freq: Every day | ORAL | 3 refills | Status: AC
Start: 2023-12-12 — End: ?

## 2023-12-12 NOTE — Assessment & Plan Note (Addendum)
 Gradual onset.  Family history of dementia.  Pending labs, MRI brain. MMSE 30/30

## 2023-12-12 NOTE — Assessment & Plan Note (Signed)
 Discussed dietary indiscretion.  Counseled on low glycemic diet, creating caloric deficit.  Printed low glycemic diet for her.  We jointly agreed to increase metformin  to maximum daily dose of 1000 mg twice daily to aid in weight loss, appetite suppression. Counseled on GI side effects.

## 2023-12-12 NOTE — Patient Instructions (Addendum)
 Pending MRI brain to evaluate for memory changes  Increase metformin  to 1000mg  twice daily to aid in appetite suppression, weight loss.   Consider linzess if constipation doesn't improve   Please download Myfitness Pal App ( basic version is free).   You may log every thing you eat for even 2-3 days to get a better of idea of total daily calories. To loose weight, we have to create caloric deficit to loose weight. The goal is 1-2 lbs per week of weight loss.   Excellent article below from [facility].   https://www.health.criticalz.it  Calorie counting made easy  Eat less, exercise more. If only it were that simple! As most dieters know, losing weight can be very challenging. As this report details, a range of influences can affect how people gain and lose weight. But a basic understanding of how to tip your energy balance in favor of weight loss is a good place to start.  Start by determining how many calories you should consume each day. To do so, you need to know how many calories you need to maintain your current weight. Doing this requires a few simple calculations.  First, multiply your current weight by 15 -- that's roughly the number of calories per pound of body weight needed to maintain your current weight if you are moderately active. Moderately active means getting at least 30 minutes of physical activity a day in the form of exercise (walking at a brisk pace, climbing stairs, or active gardening). Let's say you're a woman who is 5 feet, 4 inches tall and weighs 155 pounds, and you need to lose about 15 pounds to put you in a healthy weight range. If you multiply 155 by 15, you will get 2,325, which is the number of calories per day that you need in order to maintain your current weight (weight-maintenance calories). To lose weight, you will need to get below that total.  For example, to lose 1 to 2 pounds a week -- a rate that experts  consider safe -- your food consumption should provide 500 to 1,000 calories less than your total weight-maintenance calories. If you need 2,325 calories a day to maintain your current weight, reduce your daily calories to between 1,325 and 1,825. If you are sedentary, you will also need to build more activity into your day. In order to lose at least a pound a week, try to do at least 30 minutes of physical activity on most days, and reduce your daily calorie intake by at least 500 calories. However, calorie intake should not fall below 1,200 a day in women or 1,500 a day in men, except under the supervision of a health professional. Eating too few calories can endanger your health by depriving you of needed nutrients.  Meeting your calorie target How can you meet your daily calorie target? One approach is to add up the number of calories per serving of all the foods that you eat, and then plan your menus accordingly. You can buy books that list calories per serving for many foods. In addition, the nutrition labels on all packaged foods and beverages provide calories per serving information. Make a point of reading the labels of the foods and drinks you use, noting the number of calories and the serving sizes. Many recipes published in cookbooks, newspapers, and magazines provide similar information.  If you hate counting calories, a different approach is to restrict how much and how often you eat, and to eat meals that are  low in calories. Dietary guidelines issued by the American Heart Association stress common sense in choosing your foods rather than focusing strictly on numbers, such as total calories or calories from fat. Whichever method you choose, research shows that a regular eating schedule -- with meals and snacks planned for certain times each day -- makes for the most successful approach. The same applies after you have lost weight and want to keep it off. Sticking with an eating schedule increases  your chance of maintaining your new weight.    This is  Dr. Tullo's  ( an amazing physician in my office!)  example of a  Low GI  Diet:  It will allow you to lose 4 to 8  lbs  per month if you follow it carefully.  Your goal with exercise is a minimum of 30 minutes of aerobic exercise 5 days per week (Walking does not count once it becomes easy!)    All of the foods can be found at grocery stores and in bulk at [employer].  The Atkins protein bars and shakes are available in more varieties at [employer], [employer] and [employer].     7 AM Breakfast:  Choose from the following:  Low carbohydrate Protein  Shakes (I recommend the  Premier Protein chocolate shakes,  EAS AdvantEdge Carb Control shakes  Or the Atkins shakes all are under 3 net carbs)     a scrambled egg/bacon/cheese burrito made with Mission's carb balance whole wheat tortilla  (about 10 net carbs )  Medical Laboratory Scientific Officer (basically a quiche without the pastry crust) that is eaten cold and very convenient way to get your eggs.  8 carbs)  If you make your own protein shakes, avoid bananas and pineapple,  And use low carb greek yogurt or original /unsweetened almond or soy milk    Avoid cereal and bananas, oatmeal and cream of wheat and grits. They are loaded with carbohydrates!   10 AM: high protein snack:  Protein bar by Atkins (the snack size, under 200 cal, usually < 6 net carbs).    A stick of cheese:  Around 1 carb,  100 cal     Dannon Light n Fit Greek Yogurt  (80 cal, 8 carbs)  Other so called protein bars and Greek yogurts tend to be loaded with carbohydrates.  Remember, in food advertising, the word energy is synonymous for  carbohydrate.  Lunch:   A Sandwich using the bread choices listed, Can use any  Eggs,  lunchmeat, grilled meat or canned tuna), avocado, regular mayo/mustard  and cheese.  A Salad using blue cheese, ranch,  Goddess or vinagrette,  Avoid taco shells, croutons or confetti and  no candied nuts but regular nuts OK.   No pretzels, nabs  or chips.  Pickles and miniature sweet peppers are a good low carb alternative that provide a crunch  The bread is the only source of carbohydrate in a sandwich and  can be decreased by trying some of the attached alternatives to traditional loaf bread   Avoid Low fat dressings, as well as [place] and [employer] dressings They are loaded with sugar!   3 PM/ Mid day  Snack:  Consider  1 ounce of  almonds, walnuts, pistachios, pecans, peanuts,  Macadamia nuts or a nut medley.  Avoid granola and granola bars   Mixed nuts are ok in moderation as long as there are no raisins,  cranberries or dried fruit.   KIND bars  are OK if you get the low glycemic index variety   Try the prosciutto/mozzarella cheese sticks by [employer]  In deli /backery section   High protein      6 PM  Dinner:     Meat/fowl/fish with a green salad, and either broccoli, cauliflower, green beans, spinach, brussel sprouts or  Lima beans. DO NOT BREAD THE PROTEIN!!      There is a low carb pasta by Dreamfield's that is acceptable and tastes great: only 5 digestible carbs/serving.( All grocery stores but BJs carry it ) Several ready made meals are available low carb:   Try Michel Angelo's chicken piccata or chicken or eggplant parm over low carb pasta.(Lowes and BJs)   [place] Carnitas (pulled pork, no sauce,  0 carbs) or his beef pot roast to make a dinner burrito (at [employer])  Pesto over low carb pasta (bj's sells a good quality pesto in the center refrigerated section of the deli   Try satueeing  Glenna Blonder with mushroooms as a good side   [employer] makes a mashed cauliflower that tastes like mashed potatoes  Whole wheat pasta is still full of digestible carbs and  Not as low in glycemic index as Dreamfield's.   Brown rice is still rice,  So skip the rice and noodles if you eat Chinese or Thai (or at least limit to 1/2 cup)  9 PM snack :   Breyer's  low carb fudgsicle or  ice cream bar (Carb Smart line), or  Weight Watcher's ice cream bar , or another no sugar added ice cream;  a serving of fresh berries/cherries with whipped cream   Cheese or DANNON'S LlGHT N FIT GREEK YOGURT  8 ounces of [place] unsweetened almond/cococunut milk    Treat yourself to a parfait made with whipped cream blueberiies, walnuts and vanilla greek yogurt  Avoid bananas, pineapple, grapes  and watermelon on a regular basis because they are high in sugar.  THINK OF THEM AS DESSERT  Remember that snack Substitutions should be less than 10 NET carbs per serving and meals < 20 carbs. Remember to subtract fiber grams to get the net carbs.

## 2023-12-12 NOTE — Assessment & Plan Note (Signed)
 Pending left diagnostic breast imaging

## 2023-12-12 NOTE — Progress Notes (Signed)
 Assessment & Plan:  Memory changes Assessment & Plan: Gradual onset.  Family history of dementia.  Pending labs, MRI brain. MMSE 30/30   Orders: -     TSH -     B12 and Folate Panel -     RPR W/RFLX TO RPR TITER, TREPONEMAL AB, SCREEN AND DIAGNOSIS -     MR BRAIN W WO CONTRAST; Future  Essential hypertension -     Losartan  Potassium; Take 1 tablet (100 mg total) by mouth daily. TAKE 1 TABLET BY MOUTH EVERYDAY AT BEDTIME  Dispense: 90 tablet; Refill: 3 -     Basic metabolic panel with GFR  Prediabetes Assessment & Plan: Discussed dietary indiscretion.  Counseled on low glycemic diet, creating caloric deficit.  Printed low glycemic diet for her.  We jointly agreed to increase metformin  to maximum daily dose of 1000 mg twice daily to aid in weight loss, appetite suppression. Counseled on GI side effects.   Orders: -     metFORMIN  HCl ER; Take 2 tablets (1,000 mg total) by mouth 2 (two) times daily with a meal.  Dispense: 360 tablet; Refill: 3  Breast pain Assessment & Plan: Pending left diagnostic breast imaging  Orders: -     MM 3D DIAGNOSTIC MAMMOGRAM UNILATERAL LEFT BREAST; Future -     US  LIMITED ULTRASOUND INCLUDING AXILLA LEFT BREAST ; Future  Constipation, unspecified constipation type Assessment & Plan: Chronic.  Reviewed Dr. Calton note, treated with bowel cleanse.  Lackluster result.  Discussed potential for Linzess.  We opted to increase metformin  first and monitor for GI side effects.  Close follow up      Return precautions given.   Risks, benefits, and alternatives of the medications and treatment plan prescribed today were discussed, and patient expressed understanding.   Education regarding symptom management and diagnosis given to patient on AVS either electronically or printed.  No follow-ups on file.  [name], FNP  Subjective:    Patient ID: [name], female    DOB: 07-18-1950, 73 y.o.   MRN: [id number]  CC: Yolando Gillum  is a 73 y.o. female who presents today for follow up.   HPI: HPI Discussed the use of AI scribe software for clinical note transcription with the patient, who gave verbal consent to proceed.  History of Present Illness   Elayna Tobler is a 73 year old female who presents for follow-up on constipation and weight management issues.  She has been experiencing ongoing issues with bowel movements since a colon cleanse on November 13th. Bowel movements are inconsistent, often small, and accompanied by gas. She denies constipation but feels incomplete evacuation. She uses Miralax  daily and an herbal laxative. Her last bowel movement was this morning, described as 'just a little.' She recalls using Linzess in the past but cannot remember its effectiveness. She has a history of small bowel obstructions.  She is concerned about her weight and is managing it through diet and exercise. Her fluid intake includes water, coffee, tea, and occasionally diet soda. Her diet consists of oatmeal, fruit, meat, and vegetables. She exercises by walking on the treadmill for about 20 minutes every other day. She takes metformin  500mg  every day and has not experienced gastrointestinal side effects from it.  She experiences intermittent breast pain, located superficially inside the left breast. Denies associated shortness of breath, chest pain, or pain with walking. She recalls right breast pain over the summer, which was evaluated with an ultrasound. She has a scar from a previous  biopsy for fatty tissue removal.  She reports memory changes since last year, characterized by forgetting tasks and needing to retrace her steps. Her mother had dementia, which began in her late sixties to 64. No current alcohol  use and reports occasional hand tremors not associated with anxiety or fatigue.      Follow up yesterday with Dr Florencio for HTN, bradycardia; currently asymptomatic  A1c 6.1 10/11/23 Consult orthopedic  12/01/2023 for chronic neck pain; refill of lodine; referral to rheumatology for further evaluation of joint pain, PMR  Follow up GI 11/30/23 Recommend patient increase PPI to twice a day. We will have her do a gatorade/miralax  bowel cleanse. She will let us  know about her updated iron  labs that will be done next month by her Hematologist. I think repeating a VCE would be low yield given no melena or other significant symptoms but will re-evaluate based on labs.   Hgb 13 12/01/23   07/26/23 Right breast US  and BL Diag mammogram BI RADS 1, neg  Colonoscopy 06/05/23 ; repeat in 3 years  Allergies: Bee pollen, Celecoxib, Hydrocodone , Pollen extract, Sodium ferric gluconate [ferrous gluconate], Nasacort  [triamcinolone ], and Vicodin [hydrocodone -acetaminophen ] Current Outpatient Medications on File Prior to Visit  Medication Sig Dispense Refill   acetaminophen  (TYLENOL ) 325 MG tablet Take 2 tablets (650 mg total) by mouth every 6 (six) hours as needed for mild pain (or Fever >/= 101).     acyclovir  (ZOVIRAX ) 400 MG tablet Take 1 tablet (400 mg total) by mouth 2 (two) times daily. 180 tablet 3   cetirizine  (ZYRTEC ) 10 MG tablet Take 1 tablet (10 mg total) by mouth daily. 30 tablet 3   famotidine  (PEPCID ) 20 MG tablet TAKE 1 TABLET (20 MG TOTAL) BY MOUTH AT BEDTIME AS NEEDED FOR HEARTBURN OR INDIGESTION. 30 tablet 5   FLUoxetine (PROZAC) 40 MG capsule Take 40 mg by mouth daily.     fluticasone  (FLONASE ) 50 MCG/ACT nasal spray INSTILL 1 SPRAY INTO BOTH NOSTRILS DAILY 48 mL 0   folic acid  (FOLVITE ) 1 MG tablet Take 1 tablet (1 mg total) by mouth daily. 90 tablet 3   furosemide (LASIX) 20 MG tablet Take 40 mg by mouth daily.     gabapentin  (NEURONTIN ) 300 MG capsule TAKE 3 CAPSULES IN THE MORNING, 1 CAPSULE AT NOON, AND 3 CAPSULES AT BEDTIME 210 capsule 4   hydrocortisone  (ANUSOL -HC) 25 MG suppository Unwrap and insert 1 suppository rectally twice a day prn 12 suppository 1   levothyroxine  (SYNTHROID ) 50  MCG tablet TAKE 1 TABLET BY MOUTH EVERY DAY 90 tablet 2   Multiple Vitamin (MULTIVITAMIN WITH MINERALS) TABS tablet Take 1 tablet by mouth daily.     naltrexone (DEPADE) 50 MG tablet Take by mouth daily. Patient takes  one  50 mg tablet daily at the same time     pantoprazole  (PROTONIX ) 20 MG tablet TAKE 1 TABLET (20 MG TOTAL) BY MOUTH EVERY MORNING. TAKE 30 MINUTES TO HOUR BEFORE BREAKFAST 90 tablet 3   potassium chloride  SA (KLOR-CON  M) 20 MEQ tablet Take 1 tablet (20 mEq total) by mouth daily.     RYALTRIS 665-25 MCG/ACT SUSP Place 1 spray into the nose at bedtime. 1 spray each nostril     sodium chloride  (OCEAN) 0.65 % SOLN nasal spray Place 2 sprays into both nostrils as needed for congestion. 30 mL 2   traZODone  (DESYREL ) 50 MG tablet TAKE 1 TABLET BY MOUTH EVERY DAY FOR SLEEP 90 tablet 4   triamterene -hydrochlorothiazide  (MAXZIDE -25) 37.5-25 MG tablet  Take 0.5  to 1 tablet daily prn for BP > 140/80. 90 tablet 3   GAVILYTE-G 236 g solution Take 4,000 mLs by mouth as directed. (Patient not taking: Reported on 12/12/2023)     No current facility-administered medications on file prior to visit.    Review of Systems  Constitutional:  Negative for chills and fever.  Respiratory:  Negative for cough.   Cardiovascular:  Negative for chest pain and palpitations.  Gastrointestinal:  Negative for nausea and vomiting.      Objective:    BP 124/70   Pulse 65   Temp 98.6 F (37 C) (Oral)   Ht 5' (1.524 m)   Wt 186 lb (84.4 kg)   SpO2 96%   BMI 36.33 kg/m  BP Readings from Last 3 Encounters:  12/12/23 124/70  11/17/23 121/69  11/07/23 116/76   Wt Readings from Last 3 Encounters:  12/12/23 186 lb (84.4 kg)  11/17/23 183 lb 12.8 oz (83.4 kg)  11/07/23 185 lb (83.9 kg)    Physical Exam Vitals reviewed.  Constitutional:      Appearance: She is well-developed.  Eyes:     Conjunctiva/sclera: Conjunctivae normal.  Cardiovascular:     Rate and Rhythm: Normal rate and regular rhythm.      Pulses: Normal pulses.     Heart sounds: Normal heart sounds.  Pulmonary:     Effort: Pulmonary effort is normal.     Breath sounds: Normal breath sounds. No wheezing, rhonchi or rales.  Chest:  Breasts:    Right: No swelling, bleeding, inverted nipple, mass, nipple discharge, skin change or tenderness.     Left: Tenderness present. No swelling, bleeding, inverted nipple, mass, nipple discharge or skin change.       Comments: left breast tenderness proximal to nipple 12 oclock. no palpable mass. Skin:    General: Skin is warm and dry.  Neurological:     Mental Status: She is alert.  Psychiatric:        Speech: Speech normal.        Behavior: Behavior normal.        Thought Content: Thought content normal.

## 2023-12-12 NOTE — Assessment & Plan Note (Signed)
 Chronic.  Reviewed Dr. Calton note, treated with bowel cleanse.  Lackluster result.  Discussed potential for Linzess.  We opted to increase metformin  first and monitor for GI side effects.  Close follow up

## 2023-12-13 ENCOUNTER — Ambulatory Visit: Payer: Self-pay | Admitting: Family

## 2023-12-13 LAB — SYPHILIS: RPR W/REFLEX TO RPR TITER AND TREPONEMAL ANTIBODIES, TRADITIONAL SCREENING AND DIAGNOSIS ALGORITHM: RPR Ser Ql: NONREACTIVE

## 2023-12-18 ENCOUNTER — Inpatient Hospital Stay: Attending: Oncology

## 2023-12-18 DIAGNOSIS — D508 Other iron deficiency anemias: Secondary | ICD-10-CM | POA: Diagnosis present

## 2023-12-18 DIAGNOSIS — Z9884 Bariatric surgery status: Secondary | ICD-10-CM | POA: Diagnosis not present

## 2023-12-18 DIAGNOSIS — R200 Anesthesia of skin: Secondary | ICD-10-CM | POA: Diagnosis not present

## 2023-12-18 DIAGNOSIS — D500 Iron deficiency anemia secondary to blood loss (chronic): Secondary | ICD-10-CM

## 2023-12-18 DIAGNOSIS — Z87891 Personal history of nicotine dependence: Secondary | ICD-10-CM | POA: Diagnosis not present

## 2023-12-18 DIAGNOSIS — G629 Polyneuropathy, unspecified: Secondary | ICD-10-CM | POA: Diagnosis not present

## 2023-12-18 DIAGNOSIS — R2689 Other abnormalities of gait and mobility: Secondary | ICD-10-CM | POA: Diagnosis not present

## 2023-12-18 LAB — CBC WITH DIFFERENTIAL (CANCER CENTER ONLY)
Abs Immature Granulocytes: 0.01 K/uL (ref 0.00–0.07)
Basophils Absolute: 0 K/uL (ref 0.0–0.1)
Basophils Relative: 1 %
Eosinophils Absolute: 0 K/uL (ref 0.0–0.5)
Eosinophils Relative: 1 %
HCT: 39.7 % (ref 36.0–46.0)
Hemoglobin: 12.7 g/dL (ref 12.0–15.0)
Immature Granulocytes: 0 %
Lymphocytes Relative: 32 %
Lymphs Abs: 1.6 K/uL (ref 0.7–4.0)
MCH: 28 pg (ref 26.0–34.0)
MCHC: 32 g/dL (ref 30.0–36.0)
MCV: 87.6 fL (ref 80.0–100.0)
Monocytes Absolute: 0.4 K/uL (ref 0.1–1.0)
Monocytes Relative: 7 %
Neutro Abs: 3.1 K/uL (ref 1.7–7.7)
Neutrophils Relative %: 59 %
Platelet Count: 173 K/uL (ref 150–400)
RBC: 4.53 MIL/uL (ref 3.87–5.11)
RDW: 14.2 % (ref 11.5–15.5)
WBC Count: 5.2 K/uL (ref 4.0–10.5)
nRBC: 0 % (ref 0.0–0.2)

## 2023-12-18 LAB — IRON AND TIBC
Iron: 75 ug/dL (ref 28–170)
Saturation Ratios: 24 % (ref 10.4–31.8)
TIBC: 312 ug/dL (ref 250–450)
UIBC: 237 ug/dL

## 2023-12-18 LAB — FERRITIN: Ferritin: 264 ng/mL (ref 11–307)

## 2023-12-20 ENCOUNTER — Telehealth: Payer: Self-pay

## 2023-12-20 ENCOUNTER — Ambulatory Visit
Admission: RE | Admit: 2023-12-20 | Discharge: 2023-12-20 | Disposition: A | Source: Ambulatory Visit | Attending: Family

## 2023-12-20 ENCOUNTER — Encounter: Payer: Self-pay | Admitting: Oncology

## 2023-12-20 ENCOUNTER — Inpatient Hospital Stay: Admitting: Oncology

## 2023-12-20 ENCOUNTER — Encounter: Attending: Physician Assistant | Admitting: Physician Assistant

## 2023-12-20 ENCOUNTER — Inpatient Hospital Stay

## 2023-12-20 VITALS — BP 128/78 | HR 55 | Temp 97.7°F | Resp 18 | Wt 186.6 lb

## 2023-12-20 DIAGNOSIS — T310 Burns involving less than 10% of body surface: Secondary | ICD-10-CM | POA: Diagnosis not present

## 2023-12-20 DIAGNOSIS — T24231A Burn of second degree of right lower leg, initial encounter: Secondary | ICD-10-CM | POA: Diagnosis not present

## 2023-12-20 DIAGNOSIS — L84 Corns and callosities: Secondary | ICD-10-CM | POA: Insufficient documentation

## 2023-12-20 DIAGNOSIS — N644 Mastodynia: Secondary | ICD-10-CM

## 2023-12-20 DIAGNOSIS — X19XXXA Contact with other heat and hot substances, initial encounter: Secondary | ICD-10-CM | POA: Diagnosis not present

## 2023-12-20 DIAGNOSIS — D508 Other iron deficiency anemias: Secondary | ICD-10-CM | POA: Diagnosis not present

## 2023-12-20 DIAGNOSIS — G9009 Other idiopathic peripheral autonomic neuropathy: Secondary | ICD-10-CM | POA: Diagnosis present

## 2023-12-20 DIAGNOSIS — T24201A Burn of second degree of unspecified site of right lower limb, except ankle and foot, initial encounter: Secondary | ICD-10-CM | POA: Diagnosis not present

## 2023-12-20 DIAGNOSIS — Z9884 Bariatric surgery status: Secondary | ICD-10-CM

## 2023-12-20 DIAGNOSIS — R92322 Mammographic fibroglandular density, left breast: Secondary | ICD-10-CM | POA: Diagnosis not present

## 2023-12-20 DIAGNOSIS — D500 Iron deficiency anemia secondary to blood loss (chronic): Secondary | ICD-10-CM

## 2023-12-20 NOTE — Telephone Encounter (Signed)
 It looks like her machine is still set on auto settings 10-14cm h20, I have changed pressure setting in [facility] to 10cm h20 and when she turns her device on tonight it should reflect that. Let me know if this does not help

## 2023-12-20 NOTE — Telephone Encounter (Signed)
 Copied from CRM #[id number]. Topic: Clinical - Medical Advice >> Dec 19, 2023  8:48 AM Isabell A wrote: Reason for CRM: Patient states the pressure on her CPAP is to strong - would like to lower the pressure.   Patient last seen by Almarie Ferrari in [place].    Callback number: [phone]   Landry, Pt last saw you on 08-25-2023 and it was stated her settings were at 10 cm h20.   Please adjust settings for pt and she states this is too high.

## 2023-12-20 NOTE — Telephone Encounter (Signed)
 Informed patient of beth note,no further questions,will call back if things worsen.NFN at this time

## 2023-12-20 NOTE — Progress Notes (Signed)
 Hematology/Oncology Progress note [phone] [phone]         Patient Care Team: Jennifer Rollene MATSU, FNP as PCP - General (Family Medicine) Jennifer [name], RD as Dietitian (Family Medicine) Jennifer [name] Call, MD as Consulting Physician (Oncology) Jennifer Scrivener, MD as Consulting Physician (Pulmonary Disease) Jennifer Almarie ORN, NP as Nurse Practitioner (Pulmonary Disease) [facility], [facility] (Optometry)  ASSESSMENT & PLAN:   IDA (iron  deficiency anemia) #Iron  deficiency anemia due to chronic blood loss. Labs reviewed and discussed with patient. Lab Results  Component Value Date   HGB 12.7 12/18/2023   TIBC 312 12/18/2023   IRONPCTSAT 24 12/18/2023   FERRITIN 264 12/18/2023   Improved hemoglobin and iron  panel hold off additional Venofer     History of bariatric surgery Continue bariatric multivitamin  Recommend patient to follow up with GI for evaluation of IDA Orders Placed This Encounter  Procedures   CBC with Differential (Cancer Center Only)    Standing Status:   Future    Expected Date:   12/19/2024    Expiration Date:   03/19/2025   Iron  and TIBC    Standing Status:   Future    Expected Date:   12/19/2024    Expiration Date:   03/19/2025   Ferritin    Standing Status:   Future    Expected Date:   12/19/2024    Expiration Date:   03/19/2025   Vitamin B12    Standing Status:   Future    Expected Date:   12/19/2024    Expiration Date:   03/19/2025   Folate    Standing Status:   Future    Expected Date:   12/19/2024    Expiration Date:   03/19/2025   Follow up in 1 year All questions were answered. The patient knows to [name] the clinic with any problems, questions or concerns.  [name] Babara, MD, PhD [facility] Hematology Oncology 12/20/2023   CHIEF COMPLAINTS/REASON FOR VISIT:  Follow-up for iron  deficiency anemia.  HISTORY OF PRESENTING ILLNESS:   Jennifer [name] is a  73 y.o.  female with PMH listed below was seen in consultation at the request  of  Jennifer Rollene MATSU, FNP  for evaluation of iron  deficiency anemia  Patient was admitted from 02/05/2021 - 02/06/2021 due to symptomatic anemia with bright red blood per rectum.  Initial hemoglobin at presentation was 5.9.  Patient was transfused with 2 units of PRBC.  Also received 1 dose of IV Venofer  treatments.  Patient had a flexible sigmoidoscopy by Dr. Onita.  Findings of nonbleeding hemorrhoids.  Patient follows up with Dr. Therisa outpatient.  Patient was previously taking aspirin which has been discontinued.  Patient had hospitalization from 11/17/2020 - 11/19/2020 due to symptomatic anemia secondary to intermittent BPBPR/melena.  She received blood transfusion during that hospitalization.  She also had a reaction to ferrous gluconate. She had EGD and colonoscopy on 11/19/2020 with no notable findings of active bleeding.   Patient has a history of bariatric surgery.  05/03/2022 -05/05/2022 Admitted due to melena. Acute blood loss anemia. underwent CT angio GI bleed with no active GI hemorrhage but liver steatosis.  status post upper and lower endoscopy. Lower endoscopy showed internal hemorrhoids and upper endoscopy was within normal limits.   INTERVAL HISTORY Jennifer [name] is a 73 y.o. female who has above history reviewed by me today presents for follow up visit for iron  deficiency anemia.  Chronic fatigue, somewhat improved.  Denies hematochezia, hematuria, hematemesis, epistaxis, black tarry stool  or easy bruising.    Review of Systems  Constitutional:  Positive for fatigue. Negative for appetite change, chills and fever.  HENT:   Negative for hearing loss and voice change.   Eyes:  Negative for eye problems.  Respiratory:  Negative for chest tightness and cough.   Cardiovascular:  Negative for chest pain.  Gastrointestinal:  Negative for abdominal distention, abdominal pain and blood in stool.  Endocrine: Negative for hot flashes.  Genitourinary:  Negative for difficulty  urinating and frequency.   Musculoskeletal:  Negative for arthralgias.  Skin:  Negative for itching and rash.  Neurological:  Negative for extremity weakness.  Hematological:  Negative for adenopathy.  Psychiatric/Behavioral:  Negative for confusion.     MEDICAL HISTORY:  Past Medical History:  Diagnosis Date   Anemia    Anxiety    Bariatric surgery status    Complication of anesthesia    Diificulty breathing for about 15 minutes after bariatric surgery   Constipation    COVID-19    Dysrhythmia    Elevated liver enzymes    GERD (gastroesophageal reflux disease)    Hemorrhoids    Herpes genitalis    High cholesterol    Hyperlipidemia    Hypertension    Hypothyroidism    IDA (iron  deficiency anemia) 02/09/2021   Neuropathy    Osteoarthritis    Sleep apnea    CPAP    SURGICAL HISTORY: Past Surgical History:  Procedure Laterality Date   ABDOMINAL HYSTERECTOMY     total for fibroids no h/o abnormal pap   bariatric sleeve  2015   BREAST EXCISIONAL BIOPSY Left 1998   carpal tunnel repair     CATARACT EXTRACTION W/PHACO Right 11/01/2021   Procedure: CATARACT EXTRACTION PHACO AND INTRAOCULAR LENS PLACEMENT (IOC) RIGHT;  Surgeon: Myrna Adine Anes, MD;  Location: [facility];  Service: Ophthalmology;  Laterality: Right;  sleep apnea 4.95 00:49.7   CATARACT EXTRACTION W/PHACO Left 11/15/2021   Procedure: CATARACT EXTRACTION PHACO AND INTRAOCULAR LENS PLACEMENT (IOC) LEFT 4.94 00:32.3;  Surgeon: Myrna Adine Anes, MD;  Location: [facility];  Service: Ophthalmology;  Laterality: Left;  sleep apnea   COLONOSCOPY N/A 06/05/2023   Procedure: COLONOSCOPY;  Surgeon: Maryruth Ole DASEN, MD;  Location: [facility] ENDOSCOPY;  Service: Endoscopy;  Laterality: N/A;   COLONOSCOPY WITH PROPOFOL  N/A 02/11/2016   Procedure: COLONOSCOPY WITH PROPOFOL ;  Surgeon: Ruel Kung, MD;  Location: [facility] ENDOSCOPY;  Service: Endoscopy;  Laterality: N/A;   COLONOSCOPY WITH PROPOFOL  N/A  10/01/2019   Procedure: COLONOSCOPY WITH PROPOFOL ;  Surgeon: Kung Ruel, MD;  Location[facility];  Service: Gastroenterology;  Laterality: N/A;   COLONOSCOPY WITH PROPOFOL  N/A 11/19/2020   Procedure: COLONOSCOPY WITH PROPOFOL ;  Surgeon: Kung Ruel, MD;  Location: [facility] ENDOSCOPY;  Service: Gastroenterology;  Laterality: N/A;   COLONOSCOPY WITH PROPOFOL  N/A 05/05/2022   Procedure: COLONOSCOPY WITH PROPOFOL ;  Surgeon: Maryruth Ole DASEN, MD;  Location: [facility] ENDOSCOPY;  Service: Endoscopy;  Laterality: N/A;   ESOPHAGOGASTRODUODENOSCOPY N/A 06/05/2023   Procedure: EGD (ESOPHAGOGASTRODUODENOSCOPY);  Surgeon: Maryruth Ole DASEN, MD;  Location: [facility] ENDOSCOPY;  Service: Endoscopy;  Laterality: N/A;   ESOPHAGOGASTRODUODENOSCOPY (EGD) WITH PROPOFOL  N/A 12/02/2019   Procedure: ESOPHAGOGASTRODUODENOSCOPY (EGD) WITH PROPOFOL ;  Surgeon: Kung Ruel, MD;  Location: [facility] ENDOSCOPY;  Service: Gastroenterology;  Laterality: N/A;   ESOPHAGOGASTRODUODENOSCOPY (EGD) WITH PROPOFOL  N/A 11/19/2020   Procedure: ESOPHAGOGASTRODUODENOSCOPY (EGD) WITH PROPOFOL ;  Surgeon: Kung Ruel, MD;  Location: [facility] ENDOSCOPY;  Service: Gastroenterology;  Laterality: N/A;   ESOPHAGOGASTRODUODENOSCOPY (EGD) WITH PROPOFOL  N/A  05/05/2022   Procedure: ESOPHAGOGASTRODUODENOSCOPY (EGD) WITH PROPOFOL ;  Surgeon: Maryruth Ole DASEN, MD;  Location: [facility] ENDOSCOPY;  Service: Endoscopy;  Laterality: N/A;   FLEXIBLE SIGMOIDOSCOPY N/A 02/06/2021   Procedure: FLEXIBLE SIGMOIDOSCOPY;  Surgeon: Onita Elspeth Sharper, DO;  Location: [facility] ENDOSCOPY;  Service: Gastroenterology;  Laterality: N/A;   GIVENS CAPSULE STUDY N/A 06/07/2021   Procedure: GIVENS CAPSULE STUDY;  Surgeon: Unk Corinn Skiff, MD;  Location: [facility] ENDOSCOPY;  Service: Gastroenterology;  Laterality: N/A;   GIVENS CAPSULE STUDY N/A 06/09/2021   Procedure: GIVENS CAPSULE STUDY;  Surgeon: Unk Corinn Skiff, MD;  Location: [facility] ENDOSCOPY;  Service: Gastroenterology;  Laterality:  N/A;   HEMORRHOID SURGERY     LAPAROSCOPIC GASTRIC RESTRICTIVE DUODENAL PROCEDURE (DUODENAL SWITCH) Bilateral    2020   POLYPECTOMY  06/05/2023   Procedure: POLYPECTOMY, INTESTINE;  Surgeon: Maryruth Ole DASEN, MD;  Location: [facility] ENDOSCOPY;  Service: Endoscopy;;    SOCIAL HISTORY: Social History   Socioeconomic History   Marital status: Married    Spouse name: Not on file   Number of children: Not on file   Years of education: Not on file   Highest education level: Some college, no degree  Occupational History   Not on file  Tobacco Use   Smoking status: Former    Current packs/day: 0.00    Average packs/day: 1.5 packs/day for 24.0 years (36.0 ttl pk-yrs)    Types: Cigarettes    Start date: 75    Quit date: 6    Years since quitting: 32.9   Smokeless tobacco: Never   Tobacco comments:    quit 1995.   Vaping Use   Vaping status: Never Used  Substance and Sexual Activity   Alcohol  use: Not Currently    Alcohol /week: 14.0 standard drinks of alcohol     Types: 14 Glasses of wine per week   Drug use: No   Sexual activity: Not Currently    Birth control/protection: Surgical    Comment: Hysterectomy  Other Topics Concern   Not on file  Social History Narrative   Lives in [place].    Married.    Retired 2015, engineer, structural.    One son; granddaughter.    Left handed    Caffeine- decaf coffee.    Social Drivers of Corporate Investment Banker Strain: Low Risk  (12/01/2023)   Received from [facility]   Overall Financial Resource Strain (CARDIA)    Difficulty of Paying Living Expenses: Not hard at all  Food Insecurity: No Food Insecurity (12/08/2023)   Hunger Vital Sign    Worried About Running Out of Food in the Last Year: Never true    Ran Out of Food in the Last Year: Never true  Transportation Needs: No Transportation Needs (12/08/2023)   PRAPARE - Administrator, Civil Service (Medical): No    Lack of Transportation  (Non-Medical): No  Physical Activity: Insufficiently Active (12/08/2023)   Exercise Vital Sign    Days of Exercise per Week: 2 days    Minutes of Exercise per Session: 20 min  Stress: No Stress Concern Present (12/08/2023)   [employer] of Occupational Health - Occupational Stress Questionnaire    Feeling of Stress: Only a little  Social Connections: Unknown (12/08/2023)   Social Connection and Isolation Panel    Frequency of Communication with Friends and Family: Patient declined    Frequency of Social Gatherings with Friends and Family: Patient declined    Attends Religious Services: More than  4 times per year    Active Member of Clubs or Organizations: Patient declined    Attends Banker Meetings: Not on file    Marital Status: Married  Intimate Partner Violence: Not At Risk (04/17/2023)   Humiliation, Afraid, Rape, and Kick questionnaire    Fear of Current or Ex-Partner: No    Emotionally Abused: No    Physically Abused: No    Sexually Abused: No    FAMILY HISTORY: Family History  Problem Relation Age of Onset   Hypertension Mother    Dementia Mother    Heart disease Father    Alcohol  abuse Father    Breast cancer Sister 57       materal 1/2 sister   Hypertension Sister    Hypertension Brother     ALLERGIES:  is allergic to bee pollen, celecoxib, hydrocodone , pollen extract, sodium ferric gluconate [ferrous gluconate], nasacort  [triamcinolone ], and vicodin [hydrocodone -acetaminophen ].  MEDICATIONS:  Current Outpatient Medications  Medication Sig Dispense Refill   acetaminophen  (TYLENOL ) 325 MG tablet Take 2 tablets (650 mg total) by mouth every 6 (six) hours as needed for mild pain (or Fever >/= 101).     acyclovir  (ZOVIRAX ) 400 MG tablet Take 1 tablet (400 mg total) by mouth 2 (two) times daily. 180 tablet 3   cetirizine  (ZYRTEC ) 10 MG tablet Take 1 tablet (10 mg total) by mouth daily. 30 tablet 3   etodolac (LODINE) 500 MG tablet Take 500 mg by  mouth 2 (two) times daily.     famotidine  (PEPCID ) 20 MG tablet TAKE 1 TABLET (20 MG TOTAL) BY MOUTH AT BEDTIME AS NEEDED FOR HEARTBURN OR INDIGESTION. 30 tablet 5   FLUoxetine (PROZAC) 40 MG capsule Take 40 mg by mouth daily.     fluticasone  (FLONASE ) 50 MCG/ACT nasal spray INSTILL 1 SPRAY INTO BOTH NOSTRILS DAILY 48 mL 0   folic acid  (FOLVITE ) 1 MG tablet Take 1 tablet (1 mg total) by mouth daily. 90 tablet 3   furosemide (LASIX) 20 MG tablet Take 40 mg by mouth daily.     gabapentin  (NEURONTIN ) 300 MG capsule TAKE 3 CAPSULES IN THE MORNING, 1 CAPSULE AT NOON, AND 3 CAPSULES AT BEDTIME 210 capsule 4   hydrocortisone  (ANUSOL -HC) 25 MG suppository Unwrap and insert 1 suppository rectally twice a day prn 12 suppository 1   levothyroxine  (SYNTHROID ) 50 MCG tablet TAKE 1 TABLET BY MOUTH EVERY DAY 90 tablet 2   losartan  (COZAAR ) 100 MG tablet Take 1 tablet (100 mg total) by mouth daily. TAKE 1 TABLET BY MOUTH EVERYDAY AT BEDTIME 90 tablet 3   metFORMIN  (GLUCOPHAGE -XR) 500 MG 24 hr tablet Take 2 tablets (1,000 mg total) by mouth 2 (two) times daily with a meal. 360 tablet 3   Multiple Vitamin (MULTIVITAMIN WITH MINERALS) TABS tablet Take 1 tablet by mouth daily.     naltrexone (DEPADE) 50 MG tablet Take by mouth daily. Patient takes  one  50 mg tablet daily at the same time     pantoprazole  (PROTONIX ) 20 MG tablet TAKE 1 TABLET (20 MG TOTAL) BY MOUTH EVERY MORNING. TAKE 30 MINUTES TO HOUR BEFORE BREAKFAST 90 tablet 3   potassium chloride  SA (KLOR-CON  M) 20 MEQ tablet Take 1 tablet (20 mEq total) by mouth daily.     rosuvastatin  (CRESTOR ) 5 MG tablet Take 2.5 mg by mouth daily.     RYALTRIS 665-25 MCG/ACT SUSP Place 1 spray into the nose at bedtime. 1 spray each nostril     sodium chloride  (  OCEAN) 0.65 % SOLN nasal spray Place 2 sprays into both nostrils as needed for congestion. 30 mL 2   traZODone  (DESYREL ) 50 MG tablet TAKE 1 TABLET BY MOUTH EVERY DAY FOR SLEEP 90 tablet 4    triamterene -hydrochlorothiazide  (MAXZIDE -25) 37.5-25 MG tablet Take 0.5  to 1 tablet daily prn for BP > 140/80. 90 tablet 3   GAVILYTE-G 236 g solution Take 4,000 mLs by mouth as directed. (Patient not taking: Reported on 12/20/2023)     No current facility-administered medications for this visit.     PHYSICAL EXAMINATION: ECOG PERFORMANCE STATUS: 1 - Symptomatic but completely ambulatory Vitals:   12/20/23 1050  BP: 128/78  Pulse: (!) 55  Resp: 18  Temp: 97.7 F (36.5 C)   Filed Weights   12/20/23 1050  Weight: 186 lb 9.6 oz (84.6 kg)    Physical Exam Constitutional:      General: She is not in acute distress. HENT:     Head: Normocephalic and atraumatic.  Eyes:     General: No scleral icterus. Cardiovascular:     Rate and Rhythm: Normal rate.  Pulmonary:     Effort: Pulmonary effort is normal. No respiratory distress.  Abdominal:     General: There is no distension.  Musculoskeletal:        General: Normal range of motion.     Cervical back: Normal range of motion and neck supple.  Skin:    Findings: No erythema or rash.  Neurological:     Mental Status: She is alert and oriented to person, place, and time. Mental status is at baseline.  Psychiatric:        Mood and Affect: Mood normal.     LABORATORY DATA:  I have reviewed the data as listed    Latest Ref Rng & Units 12/18/2023   10:22 AM 08/14/2023   10:01 AM 05/05/2023   10:29 AM  CBC  WBC 4.0 - 10.5 K/uL 5.2  5.8  5.5   Hemoglobin 12.0 - 15.0 g/dL 87.2  88.3  8.6   Hematocrit 36.0 - 46.0 % 39.7  37.1  28.0   Platelets 150 - 400 K/uL 173  187  141       Latest Ref Rng & Units 12/12/2023   10:16 AM 12/19/2022   11:23 AM 11/25/2022    9:45 AM  CMP  Glucose 70 - 99 mg/dL 82  896  86   BUN 6 - 23 mg/dL 7  20  19    Creatinine 0.40 - 1.20 mg/dL 9.24  9.06  9.05   Sodium 135 - 145 mEq/L 137  138  134   Potassium 3.5 - 5.1 mEq/L 4.5  4.2  3.7   Chloride 96 - 112 mEq/L 99  98  97   CO2 19 - 32 mEq/L 34   30  31   Calcium  8.4 - 10.5 mg/dL 9.2  9.0  9.2   Total Protein 6.5 - 8.1 g/dL  7.2    Total Bilirubin <1.2 mg/dL  0.5    Alkaline Phos 38 - 126 U/L  105    AST 15 - 41 U/L  60    ALT 0 - 44 U/L  40      Lab Results  Component Value Date   IRON  75 12/18/2023   TIBC 312 12/18/2023   FERRITIN 264 12/18/2023     RADIOGRAPHIC STUDIES: I have personally reviewed the radiological images as listed and agreed with the findings in the report. MM  3D DIAGNOSTIC MAMMOGRAM UNILATERAL LEFT BREAST Result Date: 12/20/2023 CLINICAL DATA:  LEFT breast pain EXAM: DIGITAL DIAGNOSTIC UNILATERAL LEFT MAMMOGRAM WITH TOMOSYNTHESIS AND CAD; ULTRASOUND LEFT BREAST LIMITED TECHNIQUE: Left digital diagnostic mammography and breast tomosynthesis was performed. The images were evaluated with computer-aided detection. ; Targeted ultrasound examination of the left breast was performed. COMPARISON:  Previous exam(s). ACR Breast Density Category b: There are scattered areas of fibroglandular density. FINDINGS: Spot compression tomosynthesis views were obtained over the area of focal pain in the LEFT breast. No suspicious mammographic finding is identified in this area. No suspicious mass, microcalcification, or other finding is identified in the LEFT breast. On physical exam, no suspicious mass is appreciated. Targeted LEFT breast ultrasound was performed in the area of pain at the upper breast. No suspicious solid or cystic mass is identified. IMPRESSION: 1. No mammographic or sonographic evidence of malignancy at the site of painful concern in the LEFT breast. Any further workup of the patient's symptoms should be based on the clinical assessment. 2. Breast pain is a common condition, which will often resolve on its own without intervention. It can be affected by hormonal changes, medication side effect, weight changes and fit of the bra. Pain may also be referred from other adjacent areas of the body. Breast pain may be  improved by wearing adequate well-fitting support, over-the-counter topical and oral NSAID medication, low-fat diet, and ice/heat as needed. Studies have shown an improvement in cyclic pain with use of evening primrose oil, vitamin D  and vitamin E. Clinical follow-up recommended to discuss any further work-up recommendations and appropriate treatment. RECOMMENDATION: Bilateral annual screening mammogram, due July 2026 I have discussed the findings and recommendations with the patient. If applicable, a reminder letter will be sent to the patient regarding the next appointment. BI-RADS CATEGORY  1: Negative. Electronically Signed   By: Corean Salter M.D.   On: 12/20/2023 15:26   US  LIMITED ULTRASOUND INCLUDING AXILLA LEFT BREAST  Result Date: 12/20/2023 CLINICAL DATA:  LEFT breast pain EXAM: DIGITAL DIAGNOSTIC UNILATERAL LEFT MAMMOGRAM WITH TOMOSYNTHESIS AND CAD; ULTRASOUND LEFT BREAST LIMITED TECHNIQUE: Left digital diagnostic mammography and breast tomosynthesis was performed. The images were evaluated with computer-aided detection. ; Targeted ultrasound examination of the left breast was performed. COMPARISON:  Previous exam(s). ACR Breast Density Category b: There are scattered areas of fibroglandular density. FINDINGS: Spot compression tomosynthesis views were obtained over the area of focal pain in the LEFT breast. No suspicious mammographic finding is identified in this area. No suspicious mass, microcalcification, or other finding is identified in the LEFT breast. On physical exam, no suspicious mass is appreciated. Targeted LEFT breast ultrasound was performed in the area of pain at the upper breast. No suspicious solid or cystic mass is identified. IMPRESSION: 1. No mammographic or sonographic evidence of malignancy at the site of painful concern in the LEFT breast. Any further workup of the patient's symptoms should be based on the clinical assessment. 2. Breast pain is a common condition, which  will often resolve on its own without intervention. It can be affected by hormonal changes, medication side effect, weight changes and fit of the bra. Pain may also be referred from other adjacent areas of the body. Breast pain may be improved by wearing adequate well-fitting support, over-the-counter topical and oral NSAID medication, low-fat diet, and ice/heat as needed. Studies have shown an improvement in cyclic pain with use of evening primrose oil, vitamin D  and vitamin E. Clinical follow-up recommended to discuss any further work-up  recommendations and appropriate treatment. RECOMMENDATION: Bilateral annual screening mammogram, due July 2026 I have discussed the findings and recommendations with the patient. If applicable, a reminder letter will be sent to the patient regarding the next appointment. BI-RADS CATEGORY  1: Negative. Electronically Signed   By: Corean Salter M.D.   On: 12/20/2023 15:26

## 2023-12-20 NOTE — Assessment & Plan Note (Addendum)
#  Iron  deficiency anemia due to chronic blood loss. Labs reviewed and discussed with patient. Lab Results  Component Value Date   HGB 12.7 12/18/2023   TIBC 312 12/18/2023   IRONPCTSAT 24 12/18/2023   FERRITIN 264 12/18/2023   Improved hemoglobin and iron  panel hold off additional Venofer

## 2023-12-20 NOTE — Assessment & Plan Note (Signed)
Continue bariatric multivitamin.  ?

## 2023-12-21 ENCOUNTER — Ambulatory Visit: Payer: Self-pay | Admitting: Primary Care

## 2023-12-21 ENCOUNTER — Telehealth: Payer: Self-pay

## 2023-12-21 DIAGNOSIS — G4733 Obstructive sleep apnea (adult) (pediatric): Secondary | ICD-10-CM

## 2023-12-21 NOTE — Telephone Encounter (Signed)
 FYI Only or Action Required?: Action required by provider: requesting that cpap setting be lowered to 8-10. Pt would like a call back after settings are adjusted.  Patient is followed in Pulmonology for , last seen on 08/25/2023 by Hope Almarie ORN, NP.  Called Nurse Triage reporting Obstructive Sleep Apnea and Advice Only.  Symptoms began yesterday.  Interventions attempted: Nothing.  Symptoms are: unchanged.  Triage Disposition: Call PCP Within 24 Hours  Patient/caregiver understands and will follow disposition?: Yes                    Copied from CRM #[id number]. Topic: Clinical - Red Word Triage >> Dec 21, 2023  8:06 AM Benton KIDD wrote: [facility] that prompted transfer to Nurse Triage: patient calling still having issues with cpap settings sent message to clinic but patient says the settings are to high  Almarie hope morita Reason for Disposition  [1] Follow-up call from patient regarding patient's clinical status AND [2] information NON-URGENT  Answer Assessment - Initial Assessment Questions 1. REASON FOR CALL or QUESTION: What is your reason for calling today? or How can I best     Pt needs cpap machine setttings lowered. Pt thinks machine should be at 8-10 2. CALLER: Document the source of call. (e.g., laboratory staff, caregiver or patient).     Pt  Protocols used: PCP Call - No Triage-A-AH

## 2023-12-21 NOTE — Telephone Encounter (Signed)
 Copied from CRM #[id number]. Topic: Clinical - Medical Advice >> Dec 19, 2023  8:48 AM Isabell A wrote: Reason for CRM: Patient states the pressure on her CPAP is to strong - would like to lower the pressure.   Patient last seen by Almarie Ferrari in [place].    Callback number: [phone] >> Dec 20, 2023 12:11 PM Rozanna MATSU wrote: Pt calling back about getting the adjustments done on her CPAP machine and she would like a call back just know what the next steps are

## 2023-12-21 NOTE — Telephone Encounter (Signed)
 30 day d/l printed for review.  Given to [facility] to review  Jennifer [name],  Patient states the pressure is still too high.  Please advise regarding the pressures.  Thank you.

## 2023-12-21 NOTE — Telephone Encounter (Signed)
 Patient is calling back because she say her settings on the cpap machine are still to high . Its still not comfortable . Patient is requesting to speak with nurse or doctor . Please give patient a call concerning this . Going to see if triage can help first

## 2023-12-21 NOTE — Telephone Encounter (Signed)
  Ok to send new machine- but I wanted pressure settings to be 5-10cm h2

## 2023-12-21 NOTE — Telephone Encounter (Signed)
 Change pressure 5-10cm h20 please  She needs give it a couple of night to adjust to pressure change

## 2023-12-21 NOTE — Telephone Encounter (Signed)
 I called and spoke with patient, advised her of recommendations per Almarie Ferrari NP.  Pressure changed in airview to auto 5-15 cm H20.  She verbalized understanding.  She asked when she could get a new machine.  I let her know that insurance will pay every 5 year for a new machine.  I looked in airview and her setup date was 09/21/2016.  She is requesting to get a new machine.  I let her know I would let Almarie know she would like a new machine and once we hear back from her we will message her back.  She verbalized understanding.  I schedule her f/u in February 2026 (6 month f/u that had not been scheduled).  Beth, She is requesting a new machine, please advise.  She uses [facility].  Thank you.

## 2023-12-21 NOTE — Telephone Encounter (Signed)
 Order for new CPAP machine placed with settings of auto 5-10 cm H20.  Changes to pressure have already been made in Airview.  Previous change in pressure order d/c'd and new order placed for auto 5-10 cm H20. Patient contacted and made aware that new CPAP machine has been ordered.  She verbalized understanding. Nothing further needed.

## 2023-12-22 ENCOUNTER — Ambulatory Visit

## 2023-12-22 ENCOUNTER — Ambulatory Visit (INDEPENDENT_AMBULATORY_CARE_PROVIDER_SITE_OTHER): Admitting: Nurse Practitioner

## 2023-12-22 NOTE — Telephone Encounter (Signed)
 Her download from last night showed she wore it for 6 hours Current pressure 5-10cm h20 with residual events less than 1 per hour In the mean time please order a CPAP titration study, this likely wont be done until feb.  Continue CPAP night, do the best she can moving forward

## 2023-12-22 NOTE — Telephone Encounter (Signed)
 Called and spoke with patient.  Reviewed all information per [facility].  Patient states she got a great nights sleep last night with the pressure settings at the current settings of 5-10.  Per Beth,  hold off on getting the CPAP titration study at this time and give patient some time to adjust to this setting since patient did so well last night at current setting.    Patient verbalized and agreed with plan.    Patient also verified that she would be getting a new CPAP machine.  DME order was placed on 12/21/2023 with [facility] - Nurse Triage encounter in EPIC.

## 2023-12-22 NOTE — Telephone Encounter (Addendum)
 Please advise setting still uncomfortable for patient

## 2023-12-26 ENCOUNTER — Ambulatory Visit (INDEPENDENT_AMBULATORY_CARE_PROVIDER_SITE_OTHER): Admitting: Nurse Practitioner

## 2023-12-26 ENCOUNTER — Encounter (INDEPENDENT_AMBULATORY_CARE_PROVIDER_SITE_OTHER): Payer: Self-pay

## 2023-12-27 ENCOUNTER — Encounter: Payer: Self-pay | Admitting: Family Medicine

## 2023-12-27 ENCOUNTER — Ambulatory Visit: Admitting: Family Medicine

## 2023-12-27 VITALS — BP 118/70 | HR 48 | Temp 98.0°F | Ht 60.0 in | Wt 186.5 lb

## 2023-12-27 DIAGNOSIS — Z961 Presence of intraocular lens: Secondary | ICD-10-CM | POA: Diagnosis not present

## 2023-12-27 DIAGNOSIS — R051 Acute cough: Secondary | ICD-10-CM | POA: Diagnosis not present

## 2023-12-27 DIAGNOSIS — J069 Acute upper respiratory infection, unspecified: Secondary | ICD-10-CM | POA: Diagnosis not present

## 2023-12-27 DIAGNOSIS — H04123 Dry eye syndrome of bilateral lacrimal glands: Secondary | ICD-10-CM | POA: Diagnosis not present

## 2023-12-27 DIAGNOSIS — E119 Type 2 diabetes mellitus without complications: Secondary | ICD-10-CM | POA: Diagnosis not present

## 2023-12-27 DIAGNOSIS — H35371 Puckering of macula, right eye: Secondary | ICD-10-CM | POA: Diagnosis not present

## 2023-12-27 LAB — OPHTHALMOLOGY REPORT-SCANNED

## 2023-12-27 MED ORDER — PREDNISONE 20 MG PO TABS: 20.0000 mg | ORAL_TABLET | Freq: Every day | ORAL | 0 refills | Status: DC

## 2023-12-27 MED ORDER — BENZONATATE 200 MG PO CAPS: 200.0000 mg | ORAL_CAPSULE | Freq: Three times a day (TID) | ORAL | 0 refills | Status: DC | PRN

## 2023-12-27 NOTE — Assessment & Plan Note (Signed)
 In pt with history of ? Chronic sinusitis (sees ENT), also former smoker Congestion and cough with clear mucous/no fever and neg covid test at home Reassuring exam Offered low dose prednisone  for congestion-20 mg daily for 5 d Disc symptomatic care - see instructions on AVS Trial of benzonatate  tid prn cough  Update if not starting to improve in a week or if worsening  Handout given Call back and Er precautions noted in detail today

## 2023-12-27 NOTE — Progress Notes (Signed)
 Subjective:    Patient ID: Jennifer [name], female    DOB: [birth year], 73 y.o.   MRN: [id number]  HPI  Wt Readings from Last 3 Encounters:  12/27/23 186 lb 8 oz (84.6 kg)  12/20/23 186 lb 9.6 oz (84.6 kg)  12/12/23 186 lb (84.4 kg)   36.42 kg/m  Vitals:   12/27/23 1221  BP: 118/70  Pulse: (!) 48  Temp: 98 F (36.7 C)  SpO2: 97%    73 yo pt of NP Arnett presents with   Cough and congestion  (neg home covid test)  ST -improving   No fever  No chills or aches   Started over weekend  Continues  Head congestion =clear  A little headache Pressure in face in forehead  Ears feel dry , itchy   no pressure or pain  Throat- was sore to begin- better now   Cough- productive Clear to cloudy No wheezing No shortness of breath   Used to smoke - quit 29 y ago   Over the counter Tylenol  Cough drops Gargle  Flonase   Cetrizine       Patient Active Problem List   Diagnosis Date Noted   Viral URI with cough 12/27/2023   Memory changes 12/12/2023   Breast pain 12/12/2023   Acute cough 11/17/2023   Abdominal distension 10/13/2023   Excessive flatus 10/11/2023   Constipation 08/06/2023   Ventral hernia 07/31/2023   Friction blister 07/22/2022   Acute on chronic anemia 05/03/2022   Melena 05/03/2022   Alcohol  use disorder 05/03/2022   Transaminitis 11/22/2021   Tinea pedis 08/31/2021   Upper GI bleed 06/06/2021   Eye discharge 02/16/2021   IDA (iron  deficiency anemia) 02/09/2021   Acute blood loss anemia 02/06/2021   Elevated liver function tests    Bright red blood per rectum 02/05/2021   Open wound of left heel 01/20/2021   Anxiety and depression 11/25/2020   CPAP (continuous positive airway pressure) dependence 11/25/2020   Allergic conjunctivitis of both eyes and rhinitis 11/25/2020   GI bleed 11/17/2020   Chronic iron  deficiency anemia    Symptomatic anemia 11/11/2020   Postop check 08/07/2020   Biliary sludge determined by ultrasound 07/02/2020    Right upper quadrant abdominal pain 06/10/2020   Right-sided thoracic back pain 05/18/2020   Muscle spasm 05/18/2020   Bilateral tinnitus 04/30/2020   Irregular heartbeat 02/25/2020   Prediabetes 02/25/2020   Strain of right knee 02/10/2020   Aspirin long-term use 10/22/2019   Wound ballistics 08/29/2019   Displacement of lumbar intervertebral disc without myelopathy 08/29/2019   Helicobacter pylori gastrointestinal tract infection 08/29/2019   Hypercholesterolemia 08/29/2019   Phlebitis after infusion 06/26/2019   Superficial thrombophlebitis of left upper extremity 06/26/2019   BMI 38.0-38.9,adult 06/19/2019   Small bowel obstruction (HCC) 05/25/2019   Morbid obesity (HCC) 05/16/2019   Cervical pain (neck) 04/09/2019   Pedal edema 03/06/2019   Pre-operative clearance 03/06/2019   History of sleeve gastrectomy 01/23/2019   Chronic pain syndrome 10/03/2018   Obstructive sleep apnea 10/03/2018   Right shoulder pain 06/08/2018   Chronic venous insufficiency 05/24/2018   Lymphedema 05/24/2018   Lumbar spondylosis 05/17/2018   Leg swelling 05/09/2018   Neuropathy 01/24/2018   Insomnia 07/12/2017   Fracture of phalanx of finger 05/31/2017   Impingement syndrome of shoulder region 05/31/2017   Hemorrhoids 12/15/2016   Hypothyroidism 10/03/2016   Chronic shoulder bursitis 08/30/2016   Positive ANA (antinuclear antibody) 08/30/2016   Bradycardia 06/16/2016   History of adenomatous  polyp of colon 05/02/2016   Elevated liver enzymes 04/06/2016   OSA (obstructive sleep apnea) 04/06/2016   Bilateral shoulder pain 01/13/2016   Fatty liver 12/08/2015   Arthritis 12/08/2015   History of bariatric surgery 12/07/2015   Genital herpes 11/11/2015   Hyperlipidemia 11/11/2015   Essential hypertension 11/11/2015   Routine physical examination 11/11/2015   Allergic rhinitis 11/11/2015   GERD (gastroesophageal reflux disease) 11/11/2015   Herpesviral infection, unspecified 01/08/2014    Disorder of thyroid , unspecified 01/08/2014   Lumbar radiculitis 11/19/2013   Neuritis or radiculitis due to rupture of lumbar intervertebral disc 11/19/2013   Monilial vaginitis 08/20/2013   Gastritis and duodenitis 07/25/2013   Impaired fasting glucose 07/17/2012   Obesity, unspecified 07/17/2012   Obesity 07/17/2012   Depression 03/15/2012   Abnormal electrocardiogram (ECG) (EKG) 03/06/2012   Past Medical History:  Diagnosis Date   Anemia    Anxiety    Bariatric surgery status    Complication of anesthesia    Diificulty breathing for about 15 minutes after bariatric surgery   Constipation    COVID-19    Dysrhythmia    Elevated liver enzymes    GERD (gastroesophageal reflux disease)    Hemorrhoids    Herpes genitalis    High cholesterol    Hyperlipidemia    Hypertension    Hypothyroidism    IDA (iron  deficiency anemia) 02/09/2021   Neuropathy    Osteoarthritis    Sleep apnea    CPAP   Past Surgical History:  Procedure Laterality Date   ABDOMINAL HYSTERECTOMY     total for fibroids no h/o abnormal pap   bariatric sleeve  2015   BREAST EXCISIONAL BIOPSY Left 1998   carpal tunnel repair     CATARACT EXTRACTION W/PHACO Right 11/01/2021   Procedure: CATARACT EXTRACTION PHACO AND INTRAOCULAR LENS PLACEMENT (IOC) RIGHT;  Surgeon: Myrna Adine Anes, MD;  Location: [facility];  Service: Ophthalmology;  Laterality: Right;  sleep apnea 4.95 00:49.7   CATARACT EXTRACTION W/PHACO Left 11/15/2021   Procedure: CATARACT EXTRACTION PHACO AND INTRAOCULAR LENS PLACEMENT (IOC) LEFT 4.94 00:32.3;  Surgeon: Myrna Adine Anes, MD;  Location: [facility];  Service: Ophthalmology;  Laterality: Left;  sleep apnea   COLONOSCOPY N/A 06/05/2023   Procedure: COLONOSCOPY;  Surgeon: Maryruth Ole DASEN, MD;  Location: [facility] ENDOSCOPY;  Service: Endoscopy;  Laterality: N/A;   COLONOSCOPY WITH PROPOFOL  N/A 02/11/2016   Procedure: COLONOSCOPY WITH PROPOFOL ;  Surgeon: Ruel Kung,  MD;  Location: [facility] ENDOSCOPY;  Service: Endoscopy;  Laterality: N/A;   COLONOSCOPY WITH PROPOFOL  N/A 10/01/2019   Procedure: COLONOSCOPY WITH PROPOFOL ;  Surgeon: Kung Ruel, MD;  Location: [facility];  Service: Gastroenterology;  Laterality: N/A;   COLONOSCOPY WITH PROPOFOL  N/A 11/19/2020   Procedure: COLONOSCOPY WITH PROPOFOL ;  Surgeon: Kung Ruel, MD;  Location: [facility] ENDOSCOPY;  Service: Gastroenterology;  Laterality: N/A;   COLONOSCOPY WITH PROPOFOL  N/A 05/05/2022   Procedure: COLONOSCOPY WITH PROPOFOL ;  Surgeon: Maryruth Ole DASEN, MD;  Location: [facility] ENDOSCOPY;  Service: Endoscopy;  Laterality: N/A;   ESOPHAGOGASTRODUODENOSCOPY N/A 06/05/2023   Procedure: EGD (ESOPHAGOGASTRODUODENOSCOPY);  Surgeon: Maryruth Ole DASEN, MD;  Location: [facility] ENDOSCOPY;  Service: Endoscopy;  Laterality: N/A;   ESOPHAGOGASTRODUODENOSCOPY (EGD) WITH PROPOFOL  N/A 12/02/2019   Procedure: ESOPHAGOGASTRODUODENOSCOPY (EGD) WITH PROPOFOL ;  Surgeon: Kung Ruel, MD;  Location: [facility] END[facility]COPY;  Service: Gastroenterology;  Laterality: N/A;   ESOPHAGOGASTRODUODENOSCOPY (EGD) WITH PROPOFOL  N/A 11/19/2020   Procedure: ESOPHAGOGASTRODUODENOSCOPY (EGD) WITH PROPOFOL ;  Surgeon: Kung Ruel, MD;  Location: [facility] ENDOSCOPY;  Service:  Gastroenterology;  Laterality: N/A;   ESOPHAGOGASTRODUODENOSCOPY (EGD) WITH PROPOFOL  N/A 05/05/2022   Procedure: ESOPHAGOGASTRODUODENOSCOPY (EGD) WITH PROPOFOL ;  Surgeon: Maryruth Ole DASEN, MD;  Location: [facility] ENDOSCOPY;  Service: Endoscopy;  Laterality: N/A;   FLEXIBLE SIGMOIDOSCOPY N/A 02/06/2021   Procedure: FLEXIBLE SIGMOIDOSCOPY;  Surgeon: Onita Elspeth Sharper, DO;  Location: [facility] ENDOSCOPY;  Service: Gastroenterology;  Laterality: N/A;   GIVENS CAPSULE STUDY N/A 06/07/2021   Procedure: GIVENS CAPSULE STUDY;  Surgeon: Unk Corinn Skiff, MD;  Location: [facility] ENDOSCOPY;  Service: Gastroenterology;  Laterality: N/A;   GIVENS CAPSULE STUDY N/A 06/09/2021   Procedure: GIVENS CAPSULE STUDY;   Surgeon: Unk Corinn Skiff, MD;  Location: [facility] ENDOSCOPY;  Service: Gastroenterology;  Laterality: N/A;   HEMORRHOID SURGERY     LAPAROSCOPIC GASTRIC RESTRICTIVE DUODENAL PROCEDURE (DUODENAL SWITCH) Bilateral    2020   POLYPECTOMY  06/05/2023   Procedure: POLYPECTOMY, INTESTINE;  Surgeon: Maryruth Ole DASEN, MD;  Location: [facility] ENDOSCOPY;  Service: Endoscopy;;   Social History   Tobacco Use   Smoking status: Former    Current packs/day: 0.00    Average packs/day: 1.5 packs/day for 24.0 years (36.0 ttl pk-yrs)    Types: Cigarettes    Start date: 41    Quit date: 1993    Years since quitting: 32.9   Smokeless tobacco: Never   Tobacco comments:    quit 1995.   Vaping Use   Vaping status: Never Used  Substance Use Topics   Alcohol  use: Not Currently    Alcohol /week: 14.0 standard drinks of alcohol     Types: 14 Glasses of wine per week   Drug use: No   Family History  Problem Relation Age of Onset   Hypertension Mother    Dementia Mother    Heart disease Father    Alcohol  abuse Father    Breast cancer Sister 85       materal 1/2 sister   Hypertension Sister    Arthritis Sister    COPD Sister    Hypertension Brother    Allergies  Allergen Reactions   Bee Pollen Itching   Celecoxib Hives   Hydrocodone  Nausea Only   Pollen Extract Itching   Sodium Ferric Gluconate [Ferrous Gluconate]     IV ferric gluconate - shortly after the infusion developed back pain shooting into left arm and bilateral hand swelling   Nasacort  [Triamcinolone ] Other (See Comments)    Nasal - Nose Bleeds   Vicodin [Hydrocodone -Acetaminophen ] Nausea Only   Current Outpatient Medications on File Prior to Visit  Medication Sig Dispense Refill   acetaminophen  (TYLENOL ) 325 MG tablet Take 2 tablets (650 mg total) by mouth every 6 (six) hours as needed for mild pain (or Fever >/= 101).     acyclovir  (ZOVIRAX ) 400 MG tablet Take 1 tablet (400 mg total) by mouth 2 (two) times daily. 180 tablet 3    cetirizine  (ZYRTEC ) 10 MG tablet Take 1 tablet (10 mg total) by mouth daily. 30 tablet 3   famotidine  (PEPCID ) 20 MG tablet TAKE 1 TABLET (20 MG TOTAL) BY MOUTH AT BEDTIME AS NEEDED FOR HEARTBURN OR INDIGESTION. 30 tablet 5   FLUoxetine (PROZAC) 40 MG capsule Take 40 mg by mouth daily.     fluticasone  (FLONASE ) 50 MCG/ACT nasal spray INSTILL 1 SPRAY INTO BOTH NOSTRILS DAILY 48 mL 0   folic acid  (FOLVITE ) 1 MG tablet Take 1 tablet (1 mg total) by mouth daily. 90 tablet 3   furosemide (LASIX) 20 MG tablet Take 40 mg by mouth daily.  gabapentin  (NEURONTIN ) 300 MG capsule TAKE 3 CAPSULES IN THE MORNING, 1 CAPSULE AT NOON, AND 3 CAPSULES AT BEDTIME 210 capsule 4   hydrocortisone  (ANUSOL -HC) 25 MG suppository Unwrap and insert 1 suppository rectally twice a day prn 12 suppository 1   levothyroxine  (SYNTHROID ) 50 MCG tablet TAKE 1 TABLET BY MOUTH EVERY DAY 90 tablet 2   losartan  (COZAAR ) 100 MG tablet Take 1 tablet (100 mg total) by mouth daily. TAKE 1 TABLET BY MOUTH EVERYDAY AT BEDTIME 90 tablet 3   metFORMIN  (GLUCOPHAGE -XR) 500 MG 24 hr tablet Take 2 tablets (1,000 mg total) by mouth 2 (two) times daily with a meal. 360 tablet 3   Multiple Vitamin (MULTIVITAMIN WITH MINERALS) TABS tablet Take 1 tablet by mouth daily.     naltrexone (DEPADE) 50 MG tablet Take by mouth daily. Patient takes  one  50 mg tablet daily at the same time     pantoprazole  (PROTONIX ) 20 MG tablet TAKE 1 TABLET (20 MG TOTAL) BY MOUTH EVERY MORNING. TAKE 30 MINUTES TO HOUR BEFORE BREAKFAST 90 tablet 3   potassium chloride  SA (KLOR-CON  M) 20 MEQ tablet Take 1 tablet (20 mEq total) by mouth daily.     rosuvastatin  (CRESTOR ) 5 MG tablet Take 2.5 mg by mouth daily.     RYALTRIS 665-25 MCG/ACT SUSP Place 1 spray into the nose at bedtime. 1 spray each nostril     sodium chloride  (OCEAN) 0.65 % SOLN nasal spray Place 2 sprays into both nostrils as needed for congestion. 30 mL 2   traZODone  (DESYREL ) 50 MG tablet TAKE 1 TABLET BY MOUTH  EVERY DAY FOR SLEEP 90 tablet 4   triamterene -hydrochlorothiazide  (MAXZIDE -25) 37.5-25 MG tablet Take 0.5  to 1 tablet daily prn for BP > 140/80. 90 tablet 3   No current facility-administered medications on file prior to visit.    Review of Systems  Constitutional:  Positive for fatigue. Negative for appetite change and fever.  HENT:  Positive for congestion, postnasal drip, rhinorrhea, sinus pressure, sneezing and sore throat. Negative for ear pain and sinus pain.   Eyes:  Negative for pain and discharge.  Respiratory:  Positive for cough. Negative for shortness of breath, wheezing and stridor.   Cardiovascular:  Negative for chest pain.  Gastrointestinal:  Negative for diarrhea, nausea and vomiting.  Genitourinary:  Negative for frequency, hematuria and urgency.  Musculoskeletal:  Negative for arthralgias and myalgias.  Skin:  Negative for rash.  Neurological:  Positive for headaches. Negative for dizziness, weakness and light-headedness.  Psychiatric/Behavioral:  Negative for confusion and dysphoric mood.        Objective:   Physical Exam Constitutional:      General: She is not in acute distress.    Appearance: Normal appearance. She is well-developed. She is obese. She is not ill-appearing, toxic-appearing or diaphoretic.  HENT:     Head: Normocephalic and atraumatic.     Comments: Nares are injected and congested    Mild ttp left maxillary sinus area     Right Ear: Tympanic membrane, ear canal and external ear normal.     Left Ear: Tympanic membrane, ear canal and external ear normal.     Nose: Congestion and rhinorrhea present.     Comments: Clear rhinorrhea  Nares are injected     Mouth/Throat:     Mouth: Mucous membranes are moist.     Pharynx: Oropharynx is clear. No oropharyngeal exudate or posterior oropharyngeal erythema.     Comments: Clear pnd  Eyes:  General:        Right eye: No discharge.        Left eye: No discharge.     Conjunctiva/sclera:  Conjunctivae normal.     Pupils: Pupils are equal, round, and reactive to light.  Cardiovascular:     Rate and Rhythm: Normal rate.     Heart sounds: Normal heart sounds.  Pulmonary:     Effort: Pulmonary effort is normal. No respiratory distress.     Breath sounds: No stridor. Rhonchi present. No wheezing or rales.     Comments: Few scattered mild rhonchi No wheeze even on forced exp Chest:     Chest wall: No tenderness.  Musculoskeletal:     Cervical back: Normal range of motion and neck supple.  Lymphadenopathy:     Cervical: No cervical adenopathy.  Skin:    General: Skin is warm and dry.     Capillary Refill: Capillary refill takes less than 2 seconds.     Findings: No rash.  Neurological:     Mental Status: She is alert.     Cranial Nerves: No cranial nerve deficit.  Psychiatric:        Mood and Affect: Mood normal.           Assessment & Plan:   Problem List Items Addressed This Visit       Respiratory   Viral URI with cough - Primary   In pt with history of ? Chronic sinusitis (sees ENT), also former smoker Congestion and cough with clear mucous/no fever and neg covid test at home Reassuring exam Offered low dose prednisone  for congestion-20 mg daily for 5 d Disc symptomatic care - see instructions on AVS Trial of benzonatate  tid prn cough  Update if not starting to improve in a week or if worsening  Handout given Call back and Er precautions noted in detail today          Other   Acute cough

## 2023-12-27 NOTE — Patient Instructions (Addendum)
 I think you have a viral (head and chest cold) upper respiratory infection   Drink fluids and rest  mucinex  is ok for congestion  Nasal saline for congestion as needed  Try the tessalon  pearles for cough  Take the prednisone  as directed for congestion and cough  Please alert your primary provider's office if symptoms worsen (if severe or short of breath please go to the ER)

## 2023-12-29 ENCOUNTER — Ambulatory Visit: Admission: RE | Admit: 2023-12-29 | Discharge: 2023-12-29 | Attending: Family

## 2023-12-29 DIAGNOSIS — R413 Other amnesia: Secondary | ICD-10-CM

## 2023-12-29 MED ORDER — GADOBUTROL 1 MMOL/ML IV SOLN
8.0000 mL | Freq: Once | INTRAVENOUS | Status: AC | PRN
  Administered 2023-12-29: 8 mL via INTRAVENOUS

## 2024-01-04 ENCOUNTER — Ambulatory Visit: Payer: Self-pay

## 2024-01-04 NOTE — Telephone Encounter (Signed)
 Spoke to pt she is going to [facility] to b seen and is cancelling her dentist appt

## 2024-01-04 NOTE — Telephone Encounter (Signed)
 Call disconnected, attempted to call the patient, 1st attempt.   Patient was calling to schedule an appt with her PCP. Cough not improving with prednisone .

## 2024-01-04 NOTE — Telephone Encounter (Signed)
 Source  Soderquist, Jennifer [name] (Patient)   Subject  Jennifer [name], Jennifer [name] (Patient)   Topic  Clinical - Red Word Triage    Communication  Red Word that prompted transfer to Nurse Triage: caoughing and draining - drainage from your head comes back from throat and nose0 causing cough . and predisone istn working  - only prescribed 10 tablets. - getting worse not feeling better. back to where she started. Pressure on head on the right top.  Mucus - white and cloudy or clear.

## 2024-01-04 NOTE — Telephone Encounter (Signed)
 FYI Only or Action Required?: Action required by provider: request for appointment, medication refill request, and clinical question for provider.  Patient was last seen in primary care on 12/27/2023 by Randeen Laine LABOR, MD.  Called Nurse Triage reporting Nasal Congestion.  Symptoms began a week ago.  Interventions attempted: Nothing.  Symptoms are: unchanged.  Triage Disposition: No disposition on file.  Patient/caregiver understands and will follow disposition?:    Reason for Disposition  [1] Sinus congestion (pressure, fullness) AND [2] present > 10 days  Answer Assessment - Initial Assessment Questions No available appts today.  Patient has dental procedure at 3:50 PM and need to know if should still go?  Requesting appt or medication for symptoms  Advised call back/ UC and ED if symptoms worsen.  LOV: 12/27/23  1. LOCATION: Where does it hurt?      Sinus, congestion; completed prednisone  no improvement; still same symptoms 2. ONSET: When did the sinus pain start?  (e.g., hours, days)      12/27/23 3. SEVERITY: How bad is the pain?   (Scale 0-10; or none, mild, moderate or severe)     3/10 5. NASAL CONGESTION: Is the nose blocked? If Yes, ask: Can you open it or must you breathe through your mouth?     yes 6. NASAL DISCHARGE: Do you have discharge from your nose? If so ask, What color?     White, clear 7. FEVER: Do you have a fever? If Yes, ask: What is it, how was it measured, and when did it start?      Denies fever chills n/v 8. OTHER SYMPTOMS: Do you have any other symptoms? (e.g., sore throat, cough, earache, difficulty breathing)   HA; pressure, congestion, comes and goes, 3/10, cough, post nasal drip  Protocols used: Sinus Pain or Congestion-A-AH

## 2024-01-09 ENCOUNTER — Encounter: Admitting: Physician Assistant

## 2024-01-09 DIAGNOSIS — G9009 Other idiopathic peripheral autonomic neuropathy: Secondary | ICD-10-CM | POA: Diagnosis not present

## 2024-01-15 NOTE — Telephone Encounter (Signed)
-----   Message from Darice Shivers, MD sent at 01/12/2024  2:24 PM EST ----- Hello, it sounds like it is better (or at least not as well visualized). As long as no pituitary symptoms, no need for further imaging usually.  Happy New year!  KA ----- Message ----- From: Dineen Rollene MATSU, FNP Sent: 01/07/2024   7:58 AM EST To: Darice CHRISTELLA Shivers, MD  Dr Shivers,  I hope that you are doing well and apologize for bothering you.  I just wanted your eyes  on an incidental finding MRI brain which I obtained in the setting of subtle memory changes.  MRI brain 01/01/24 shows decreased conspicuity of prior focus in pituitary.  MRI brain 05/06/2021 showed 3mm microadenoma.  I just wanted to ensure that dedicated imaging and/or further surveillance is not necessary.  Regards,  Rollene

## 2024-01-15 NOTE — Telephone Encounter (Signed)
 Call patient Please let her know that I consulted with neurology, Dr. Trellis regarding a prior focus over the pituitary gland.  It is not as well-visualized on mri brain suggesting smaller in size   If she has headaches, double vision, or progressive changes in her vision, I would however recommend consult with neurology.

## 2024-01-16 ENCOUNTER — Ambulatory Visit: Admitting: Family

## 2024-01-16 NOTE — Progress Notes (Deleted)
 "  Assessment & Plan:  There are no diagnoses linked to this encounter.   Return precautions given.   Risks, benefits, and alternatives of the medications and treatment plan prescribed today were discussed, and patient expressed understanding.   Education regarding symptom management and diagnosis given to patient on AVS either electronically or printed.  No follow-ups on file.  [name], FNP  Subjective:    Patient ID: Jennifer [name], female    DOB: 05-10-1950, 73 y.o.   MRN: [id number]  CC: Jennifer [name] is a 73 y.o. female who presents today for follow up.   HPI: HPI Discuss MRI brain Denies vision changes, diplopia, HA   Allergies: Bee pollen, Celecoxib, Hydrocodone , Pollen extract, Sodium ferric gluconate [ferrous gluconate], Nasacort  [triamcinolone ], and Vicodin [hydrocodone -acetaminophen ] Medications Ordered Prior to Encounter[1]  Review of Systems    Objective:    There were no vitals taken for this visit. BP Readings from Last 3 Encounters:  12/27/23 118/70  12/20/23 128/78  12/12/23 124/70   Wt Readings from Last 3 Encounters:  12/27/23 186 lb 8 oz (84.6 kg)  12/20/23 186 lb 9.6 oz (84.6 kg)  12/12/23 186 lb (84.4 kg)    Physical Exam        [1]  Current Outpatient Medications on File Prior to Visit  Medication Sig Dispense Refill   acetaminophen  (TYLENOL ) 325 MG tablet Take 2 tablets (650 mg total) by mouth every 6 (six) hours as needed for mild pain (or Fever >/= 101).     acyclovir  (ZOVIRAX ) 400 MG tablet Take 1 tablet (400 mg total) by mouth 2 (two) times daily. 180 tablet 3   benzonatate  (TESSALON ) 200 MG capsule Take 1 capsule (200 mg total) by mouth 3 (three) times daily as needed for cough. Swallow whole 20 capsule 0   cetirizine  (ZYRTEC ) 10 MG tablet Take 1 tablet (10 mg total) by mouth daily. 30 tablet 3   famotidine  (PEPCID ) 20 MG tablet TAKE 1 TABLET (20 MG TOTAL) BY MOUTH AT BEDTIME AS NEEDED FOR HEARTBURN OR INDIGESTION. 30  tablet 5   FLUoxetine (PROZAC) 40 MG capsule Take 40 mg by mouth daily.     fluticasone  (FLONASE ) 50 MCG/ACT nasal spray INSTILL 1 SPRAY INTO BOTH NOSTRILS DAILY 48 mL 0   folic acid  (FOLVITE ) 1 MG tablet Take 1 tablet (1 mg total) by mouth daily. 90 tablet 3   furosemide (LASIX) 20 MG tablet Take 40 mg by mouth daily.     gabapentin  (NEURONTIN ) 300 MG capsule TAKE 3 CAPSULES IN THE MORNING, 1 CAPSULE AT NOON, AND 3 CAPSULES AT BEDTIME 210 capsule 4   hydrocortisone  (ANUSOL -HC) 25 MG suppository Unwrap and insert 1 suppository rectally twice a day prn 12 suppository 1   levothyroxine  (SYNTHROID ) 50 MCG tablet TAKE 1 TABLET BY MOUTH EVERY DAY 90 tablet 2   losartan  (COZAAR ) 100 MG tablet Take 1 tablet (100 mg total) by mouth daily. TAKE 1 TABLET BY MOUTH EVERYDAY AT BEDTIME 90 tablet 3   metFORMIN  (GLUCOPHAGE -XR) 500 MG 24 hr tablet Take 2 tablets (1,000 mg total) by mouth 2 (two) times daily with a meal. 360 tablet 3   Multiple Vitamin (MULTIVITAMIN WITH MINERALS) TABS tablet Take 1 tablet by mouth daily.     naltrexone (DEPADE) 50 MG tablet Take by mouth daily. Patient takes  one  50 mg tablet daily at the same time     pantoprazole  (PROTONIX ) 20 MG tablet TAKE 1 TABLET (20 MG TOTAL) BY MOUTH EVERY MORNING. TAKE 30  MINUTES TO HOUR BEFORE BREAKFAST 90 tablet 3   potassium chloride  SA (KLOR-CON  M) 20 MEQ tablet Take 1 tablet (20 mEq total) by mouth daily.     predniSONE  (DELTASONE ) 20 MG tablet Take 1 tablet (20 mg total) by mouth daily with breakfast. 5 tablet 0   rosuvastatin  (CRESTOR ) 5 MG tablet Take 2.5 mg by mouth daily.     RYALTRIS 665-25 MCG/ACT SUSP Place 1 spray into the nose at bedtime. 1 spray each nostril     sodium chloride  (OCEAN) 0.65 % SOLN nasal spray Place 2 sprays into both nostrils as needed for congestion. 30 mL 2   traZODone  (DESYREL ) 50 MG tablet TAKE 1 TABLET BY MOUTH EVERY DAY FOR SLEEP 90 tablet 4   triamterene -hydrochlorothiazide  (MAXZIDE -25) 37.5-25 MG tablet Take 0.5   to 1 tablet daily prn for BP > 140/80. 90 tablet 3   No current facility-administered medications on file prior to visit.   "

## 2024-01-24 ENCOUNTER — Ambulatory Visit: Admitting: Family

## 2024-01-27 ENCOUNTER — Other Ambulatory Visit: Payer: Self-pay | Admitting: Family

## 2024-01-27 DIAGNOSIS — A6004 Herpesviral vulvovaginitis: Secondary | ICD-10-CM

## 2024-02-13 ENCOUNTER — Ambulatory Visit: Admitting: Family

## 2024-02-14 ENCOUNTER — Telehealth: Payer: Self-pay | Admitting: Family

## 2024-02-14 ENCOUNTER — Encounter: Payer: Self-pay | Admitting: Family

## 2024-02-14 ENCOUNTER — Ambulatory Visit: Admitting: Family

## 2024-02-14 DIAGNOSIS — E782 Mixed hyperlipidemia: Secondary | ICD-10-CM

## 2024-02-14 DIAGNOSIS — F32A Depression, unspecified: Secondary | ICD-10-CM | POA: Diagnosis not present

## 2024-02-14 DIAGNOSIS — Z6837 Body mass index (BMI) 37.0-37.9, adult: Secondary | ICD-10-CM | POA: Diagnosis not present

## 2024-02-14 DIAGNOSIS — K760 Fatty (change of) liver, not elsewhere classified: Secondary | ICD-10-CM | POA: Diagnosis not present

## 2024-02-14 DIAGNOSIS — F1090 Alcohol use, unspecified, uncomplicated: Secondary | ICD-10-CM | POA: Diagnosis not present

## 2024-02-14 DIAGNOSIS — F419 Anxiety disorder, unspecified: Secondary | ICD-10-CM

## 2024-02-14 DIAGNOSIS — I10 Essential (primary) hypertension: Secondary | ICD-10-CM | POA: Diagnosis not present

## 2024-02-14 MED ORDER — FLUOXETINE HCL 40 MG PO CAPS: 40.0000 mg | ORAL_CAPSULE | Freq: Every day | ORAL | 3 refills | Status: AC

## 2024-02-14 MED ORDER — WEGOVY 1.5 MG PO TABS: 1.5000 mg | ORAL_TABLET | Freq: Every day | ORAL | 1 refills | Status: DC

## 2024-02-14 NOTE — Assessment & Plan Note (Addendum)
 Fibrosis 4 Score = 3.54  Fib-4 interpretation is not validated for people under 35 or over 75 years of age. However, scores under 2.0 are generally considered low risk.  She has been sober for 1 year.  I have sent a staff message Dr. Maryruth with whom she has an appointment with this spring.  She may benefit from updating right upper quadrant ultrasound for continued surveillance of MASLD.

## 2024-02-14 NOTE — Progress Notes (Signed)
 "  Assessment & Plan:  Morbid obesity (HCC) Assessment & Plan: History of bariatric surgery.  Patient would like to start weight loss medication.  Start Wegovy  1.5 mg and titrate.  Counseled extensively on side effects, blackbox warning as it relates to thyroid  cancer, endocrine cancer.  Counseled on method of action ,titration. Close follow-up.  Orders: -     Wegovy ; Take 1 tablet (1.5 mg total) by mouth daily. Daily in AM on an empty stomach with 4 oz of water. Do not eat or drink for 30 minutes after dose.  Dispense: 30 tablet; Refill: 1  Fatty liver Assessment & Plan: Fibrosis 4 Score = 3.54  Fib-4 interpretation is not validated for people under 35 or over 80 years of age. However, scores under 2.0 are generally considered low risk.  She has been sober for 1 year.  I have sent a staff message Dr. Maryruth with whom she has an appointment with this spring.  She may benefit from updating right upper quadrant ultrasound for continued surveillance of MASLD.     Orders: -     CBC with Differential/Platelet -     Hepatic function panel  Alcohol  use disorder Assessment & Plan: She is no longer following with [facility] 1 year sober as of 12/2023 and she stopped naltrexone 01/2024. Will continue to monitor closely.   Anxiety and depression Assessment & Plan: Chronic, stable.  Continue fluoxetine  40 mg daily, trazodone  50 mg nightly   Metabolic dysfunction-associated steatotic liver disease (MASLD) Assessment & Plan: Fibrosis 4 Score = 3.54  Fib-4 interpretation is not validated for people under 35 or over 33 years of age. However, scores under 2.0 are generally considered low risk.  She has been sober for 1 year.  I have sent a staff message Dr. Maryruth with whom she has an appointment with this spring.  She may benefit from updating right upper quadrant ultrasound for continued surveillance of MASLD.      Other orders -     FLUoxetine  HCl; Take 1 capsule (40 mg  total) by mouth daily.  Dispense: 90 capsule; Refill: 3     Return precautions given.   Risks, benefits, and alternatives of the medications and treatment plan prescribed today were discussed, and patient expressed understanding.   Education regarding symptom management and diagnosis given to patient on AVS either electronically or printed.  Return in about 1 month (around 03/16/2024).  Jennifer Northern, FNP  Subjective:    Patient ID: Jennifer [name], female    DOB: 29-May-1950, 74 y.o.   MRN: [id number]  CC: Jennifer [name] is a 74 y.o. female who presents today for follow up.   HPI: HPI Discussed the use of AI scribe software for clinical note transcription with the patient, who gave verbal consent to proceed.  History of Present Illness   Jennifer [name] is a 74 year old female who presents for weight management and medication review.  She has a history of bariatric sleeve surgery in 2015 with a revision in 2020. She is interested in starting a weight loss medication. Denies current issues with constipation, although she uses Miralax  as needed.Denies history of pancreatitis . History of  cholecystectomy.  Her half-sister having had a thyroidectomy and a history of breast cancer. Her mother also had thyroid  disease. Denies known family history of thyroid  cancer with half sister or mother . Denies h/o medullary thyroid  cancer or multiple endocrine neoplasia.  She has been prediabetic in the past.  She has been  longer following with [facility] if she does not feel its necessary.  She has been sober for 1 year and has stopped naltrexone.  She wishes for me to resume prescription for fluoxetine  which she finds effective for depression.  She also takes trazodone  for sleep.  She is scheduled for a neurology appointment in April due to memory changes, and a recent MRI.     She denies headache, double vision, progressive changes in vision   Starting GLP agonist /  GIP:  Denies family history medullary thyroid  cancer, multiple endocrine neoplasia. Denies personal history of pancreatitis, multiple endocrine neoplasia, chronic constipation, history of ileus.  Denies history of cholelithiasis and cholecystitis Denies feeling of mass in the neck, dysphagia, dyspnea, persistent hoarseness   Eye exam is up-to-date.  No known history of retinopathy.   H/o predm  H/o bariatric sleeve, cholecystectomy  MRI brain 12/29/23   1. No acute intracranial abnormality or mass. No specific findings to explain memory loss. 2. Decreased conspicuity of the previously seen 3 mm hypoenhancing focus in the left aspect of the pituitary gland on nondedicated imaging. Allergies: Bee pollen, Celecoxib, Hydrocodone , Pollen extract, Sodium ferric gluconate [ferrous gluconate], Nasacort  [triamcinolone ], and Vicodin [hydrocodone -acetaminophen ] Medications Ordered Prior to Encounter[1]  Review of Systems  Constitutional:  Negative for chills and fever.  Eyes:  Negative for visual disturbance.  Respiratory:  Negative for cough.   Cardiovascular:  Negative for chest pain and palpitations.  Gastrointestinal:  Negative for nausea and vomiting.      Objective:    BP 118/64   Pulse 66   Ht 5' (1.524 m)   Wt 190 lb 6.4 oz (86.4 kg)   SpO2 94%   BMI 37.18 kg/m  BP Readings from Last 3 Encounters:  02/14/24 118/64  12/27/23 118/70  12/20/23 128/78   Wt Readings from Last 3 Encounters:  02/14/24 190 lb 6.4 oz (86.4 kg)  12/27/23 186 lb 8 oz (84.6 kg)  12/20/23 186 lb 9.6 oz (84.6 kg)    Physical Exam Vitals reviewed.  Constitutional:      Appearance: She is well-developed.  Eyes:     Conjunctiva/sclera: Conjunctivae normal.  Cardiovascular:     Rate and Rhythm: Normal rate and regular rhythm.     Pulses: Normal pulses.     Heart sounds: Normal heart sounds.  Pulmonary:     Effort: Pulmonary effort is normal.     Breath sounds: Normal breath sounds. No  wheezing, rhonchi or rales.  Skin:    General: Skin is warm and dry.  Neurological:     Mental Status: She is alert.  Psychiatric:        Speech: Speech normal.        Behavior: Behavior normal.        Thought Content: Thought content normal.            [1]  Current Outpatient Medications on File Prior to Visit  Medication Sig Dispense Refill   acetaminophen  (TYLENOL ) 325 MG tablet Take 2 tablets (650 mg total) by mouth every 6 (six) hours as needed for mild pain (or Fever >/= 101).     acyclovir  (ZOVIRAX ) 400 MG tablet TAKE 1 TABLET BY MOUTH TWICE A DAY 60 tablet 11   famotidine  (PEPCID ) 20 MG tablet TAKE 1 TABLET (20 MG TOTAL) BY MOUTH AT BEDTIME AS NEEDED FOR HEARTBURN OR INDIGESTION. 30 tablet 5   fluticasone  (FLONASE ) 50 MCG/ACT nasal spray INSTILL 1 SPRAY INTO BOTH NOSTRILS DAILY 48 mL 0  folic acid  (FOLVITE ) 1 MG tablet Take 1 tablet (1 mg total) by mouth daily. 90 tablet 3   furosemide (LASIX) 20 MG tablet Take 40 mg by mouth daily.     gabapentin  (NEURONTIN ) 300 MG capsule TAKE 3 CAPSULES IN THE MORNING, 1 CAPSULE AT NOON, AND 3 CAPSULES AT BEDTIME 210 capsule 4   hydrocortisone  (ANUSOL -HC) 25 MG suppository Unwrap and insert 1 suppository rectally twice a day prn 12 suppository 1   levothyroxine  (SYNTHROID ) 50 MCG tablet TAKE 1 TABLET BY MOUTH EVERY DAY 90 tablet 2   losartan  (COZAAR ) 100 MG tablet Take 1 tablet (100 mg total) by mouth daily. TAKE 1 TABLET BY MOUTH EVERYDAY AT BEDTIME 90 tablet 3   Multiple Vitamin (MULTIVITAMIN WITH MINERALS) TABS tablet Take 1 tablet by mouth daily.     pantoprazole  (PROTONIX ) 20 MG tablet TAKE 1 TABLET (20 MG TOTAL) BY MOUTH EVERY MORNING. TAKE 30 MINUTES TO HOUR BEFORE BREAKFAST 90 tablet 3   potassium chloride  SA (KLOR-CON  M) 20 MEQ tablet Take 1 tablet (20 mEq total) by mouth daily.     rosuvastatin  (CRESTOR ) 5 MG tablet Take 2.5 mg by mouth daily.     RYALTRIS 665-25 MCG/ACT SUSP Place 1 spray into the nose at bedtime. 1 spray each  nostril     sodium chloride  (OCEAN) 0.65 % SOLN nasal spray Place 2 sprays into both nostrils as needed for congestion. 30 mL 2   traZODone  (DESYREL ) 50 MG tablet TAKE 1 TABLET BY MOUTH EVERY DAY FOR SLEEP 90 tablet 4   triamterene -hydrochlorothiazide  (MAXZIDE -25) 37.5-25 MG tablet Take 0.5  to 1 tablet daily prn for BP > 140/80. 90 tablet 3   No current facility-administered medications on file prior to visit.   "

## 2024-02-14 NOTE — Patient Instructions (Addendum)
 Trial of wegovy  PILL 1.5mg  for 30 days.   Please reference to wegovy .com manufacture website in regards to cost.   Coupon is online or they can text SAVE to [phone].    https://www.wegovy .com/obesity/what-to-pay-for-wegovy .html?tc=ps_yism9j5&cc=2003&utm_medium=cpc&utm_source=miso&utm_campaign=wegovy_oral_launch&utm_content=product_information_us25semo03242&showisi=true&gclid=[email or web address]&gclsrc=3p.ds&&utm_source=bing&utm_medium=cpc&utm_term=wegovy %20savings&utm_campaign=&utm_content=-dc_pcrid_73255306975478_pkw_wegovy%20savings_pmt_bp_slid__product_&pgrid=[id number]&ptaid=kwd-[id number]:loc-190&msclkid=[id number]   It is important to take this medication on an empty stomach and with 4 ounces of water.  Wait at least 30 minutes before eating, drinking or taking other oral medications. Wait one hour before taking your thyroid  medication, Synthroid .    We have discussed starting non insulin daily injectable medication called Wegovy   which is a glucagon like peptide (GLP 1) agonist and works by delaying gastric emptying and increasing insulin secretion.It is given once per week. Most patients see significant weight loss with this drug class.   You may NOT take either medication if you or your family has history of thyroid , parathyroid, OR adrenal cancer. Please confirm you and your family does NOT have this history as this drug class has black box warning on this medication for that reason.   Please follow  directions on prescription and slowly increase the medication for your safety over time.   We can slowly titrate further at follow up with goal of no more than 1-2 lbs weight loss per week.  Semaglutide  (Wegovy )  Dose (mg) Once Weekly Titration:    If a dose is not tolerated, consider delaying further dose increases for  another 4 weeks.  If you are actively losing weight on a dose, do not increase medication.   Days 1-30  1.5mg   daily  Days 31-60  4 mg daily  Days 61-90 9mg  daily  Maintenance Dose 25mg  daily

## 2024-02-14 NOTE — Telephone Encounter (Signed)
 Pt canceled with [facility] neurology 05/07/24 for memory changes  MRI brain obtained 12/29/23 No acute findings; however concern for pituitary lesion   See impression:  Decreased conspicuity of the previously seen 3 mm hypoenhancing focus in the left aspect of the pituitary gland on nondedicated imaging  Does Dr Lane advise MRI pituitary protocol?   Patient desires to start wegovy  ; any concerns with warning for endocrine neoplasia?

## 2024-02-15 ENCOUNTER — Ambulatory Visit: Admitting: Family

## 2024-02-15 ENCOUNTER — Telehealth: Payer: Self-pay

## 2024-02-15 LAB — CBC WITH DIFFERENTIAL/PLATELET
Basophils Absolute: 0.1 10*3/uL (ref 0.0–0.1)
Basophils Relative: 0.9 % (ref 0.0–3.0)
Eosinophils Absolute: 0.1 10*3/uL (ref 0.0–0.7)
Eosinophils Relative: 1.8 % (ref 0.0–5.0)
HCT: 40.4 % (ref 36.0–46.0)
Hemoglobin: 13 g/dL (ref 12.0–15.0)
Lymphocytes Relative: 32.7 % (ref 12.0–46.0)
Lymphs Abs: 1.9 10*3/uL (ref 0.7–4.0)
MCHC: 32.2 g/dL (ref 30.0–36.0)
MCV: 86.3 fl (ref 78.0–100.0)
Monocytes Absolute: 0.6 10*3/uL (ref 0.1–1.0)
Monocytes Relative: 10 % (ref 3.0–12.0)
Neutro Abs: 3.2 10*3/uL (ref 1.4–7.7)
Neutrophils Relative %: 54.6 % (ref 43.0–77.0)
Platelets: 127 10*3/uL — ABNORMAL LOW (ref 150.0–400.0)
RBC: 4.68 Mil/uL (ref 3.87–5.11)
RDW: 13.8 % (ref 11.5–15.5)
WBC: 5.9 10*3/uL (ref 4.0–10.5)

## 2024-02-15 LAB — HEPATIC FUNCTION PANEL
ALT: 37 U/L — ABNORMAL HIGH (ref 3–35)
AST: 37 U/L (ref 5–37)
Albumin: 4.2 g/dL (ref 3.5–5.2)
Alkaline Phosphatase: 106 U/L (ref 39–117)
Bilirubin, Direct: 0.1 mg/dL (ref 0.1–0.3)
Total Bilirubin: 0.4 mg/dL (ref 0.2–1.2)
Total Protein: 6.6 g/dL (ref 6.0–8.3)

## 2024-02-15 NOTE — Telephone Encounter (Signed)
 Copied from CRM #[id number]. Topic: Clinical - Prescription Issue >> Feb 15, 2024  1:44 PM Amber H wrote: Reason for CRM: Patient stated she went to the pharmacy yesterday and tried to pick up her Wegovy  however, they stated the insurance was requesting more information. Patient wanted to know if provider could assist her with that.   [phone]

## 2024-02-15 NOTE — Telephone Encounter (Signed)
 Spoke to pt about wegovy  medication, I will forward this matter to Rollene thank you

## 2024-02-15 NOTE — Telephone Encounter (Signed)
 Copied from CRM [id number]. Topic: General - Call Back - No Documentation >> Feb 15, 2024  1:41 PM Amber H wrote: Reason for CRM: Patient stated she missed a call from the office. She is returning a call. No documentation in file and no vm was left  [phone]

## 2024-02-16 ENCOUNTER — Ambulatory Visit: Payer: Self-pay | Admitting: Family

## 2024-02-16 ENCOUNTER — Telehealth: Payer: Self-pay

## 2024-02-16 DIAGNOSIS — K760 Fatty (change of) liver, not elsewhere classified: Secondary | ICD-10-CM

## 2024-02-16 NOTE — Assessment & Plan Note (Signed)
 History of bariatric surgery.  Patient would like to start weight loss medication.  Start Wegovy  1.5 mg and titrate.  Counseled extensively on side effects, blackbox warning as it relates to thyroid  cancer, endocrine cancer.  Counseled on method of action ,titration. Close follow-up.

## 2024-02-16 NOTE — Assessment & Plan Note (Signed)
 She is no longer following with [facility] 1 year sober as of 12/2023 and she stopped naltrexone 01/2024. Will continue to monitor closely.

## 2024-02-16 NOTE — Telephone Encounter (Signed)
 I called the pharmacy and pharmacist stated her insurance needs a PA so I sent one to the PA team to start that process

## 2024-02-16 NOTE — Telephone Encounter (Signed)
 Pa needed wegovy 

## 2024-02-16 NOTE — Assessment & Plan Note (Signed)
 Chronic, stable.  Continue fluoxetine  40 mg daily, trazodone  50 mg nightly

## 2024-02-20 NOTE — Telephone Encounter (Unsigned)
 Copied from CRM #[id number]. Topic: General - Call Back - No Documentation >> Feb 19, 2024  2:36 PM Rea C wrote: Reason for CRM: Pt is calling to follow up on request for wegovy  and would like a phone call update.      [phone] (M)

## 2024-02-21 NOTE — Telephone Encounter (Unsigned)
 Copied from CRM [id number]. Topic: General - Call Back - No Documentation >> Feb 21, 2024 12:13 PM Rea ORN wrote: Reason for CRM: Pt returning call to [place].   Please call back,  [phone] or  [phone]

## 2024-02-21 NOTE — Telephone Encounter (Signed)
 Insurance did not approve Wegovy .    Please call [facility] neurology ans ask to speak to Dr Clement nurse   let her know of upcoming appointment for pt  and MRI brain for his review.    Please  fax MRI brain  12/29/23 to Dr. Lane, neurology to his attention.  Patient is scheduled to see him 05/07/24

## 2024-02-21 NOTE — Telephone Encounter (Signed)
 Pt stated that she has spoken to another one of her Dr and they were going to recommend something else, so she would let us  know what it was. Pt also wanted to know was there a reason why she was not having the US  until April??

## 2024-02-21 NOTE — Telephone Encounter (Signed)
 LVM to call back to office to discuss message below

## 2024-02-22 NOTE — Telephone Encounter (Signed)
 See telephone note from 02/22/24

## 2024-02-22 NOTE — Telephone Encounter (Signed)
 See my chart message regarding this matter per [facility]

## 2024-02-27 ENCOUNTER — Ambulatory Visit: Admitting: Primary Care

## 2024-03-19 ENCOUNTER — Ambulatory Visit: Admitting: Family

## 2024-04-19 ENCOUNTER — Ambulatory Visit

## 2024-04-26 ENCOUNTER — Encounter

## 2024-05-16 ENCOUNTER — Ambulatory Visit

## 2024-12-16 ENCOUNTER — Inpatient Hospital Stay

## 2024-12-19 ENCOUNTER — Inpatient Hospital Stay: Admitting: Oncology

## 2024-12-19 ENCOUNTER — Inpatient Hospital Stay
